# Patient Record
Sex: Male | Born: 1935 | Race: Black or African American | Hispanic: No | Marital: Married | Smoking: Never smoker
Health system: Southern US, Community
[De-identification: ages and names within clinical notes are randomized; demographics above are authoritative.]

## PROBLEM LIST (undated history)

## (undated) ENCOUNTER — Ambulatory Visit (HOSPITAL_COMMUNITY): Payer: Medicare Other

## (undated) DIAGNOSIS — R51 Headache: Secondary | ICD-10-CM

## (undated) DIAGNOSIS — H5440 Blindness, one eye, unspecified eye: Secondary | ICD-10-CM

## (undated) DIAGNOSIS — I10 Essential (primary) hypertension: Secondary | ICD-10-CM

## (undated) DIAGNOSIS — I639 Cerebral infarction, unspecified: Secondary | ICD-10-CM

## (undated) DIAGNOSIS — I2510 Atherosclerotic heart disease of native coronary artery without angina pectoris: Secondary | ICD-10-CM

## (undated) DIAGNOSIS — Z8719 Personal history of other diseases of the digestive system: Secondary | ICD-10-CM

## (undated) DIAGNOSIS — Z91199 Patient's noncompliance with other medical treatment and regimen due to unspecified reason: Secondary | ICD-10-CM

## (undated) DIAGNOSIS — H547 Unspecified visual loss: Secondary | ICD-10-CM

## (undated) DIAGNOSIS — F1011 Alcohol abuse, in remission: Secondary | ICD-10-CM

## (undated) DIAGNOSIS — M1990 Unspecified osteoarthritis, unspecified site: Secondary | ICD-10-CM

## (undated) DIAGNOSIS — I5032 Chronic diastolic (congestive) heart failure: Secondary | ICD-10-CM

## (undated) DIAGNOSIS — Z87448 Personal history of other diseases of urinary system: Secondary | ICD-10-CM

## (undated) DIAGNOSIS — I471 Supraventricular tachycardia, unspecified: Secondary | ICD-10-CM

## (undated) DIAGNOSIS — Z9119 Patient's noncompliance with other medical treatment and regimen: Secondary | ICD-10-CM

## (undated) DIAGNOSIS — E785 Hyperlipidemia, unspecified: Secondary | ICD-10-CM

## (undated) DIAGNOSIS — R0989 Other specified symptoms and signs involving the circulatory and respiratory systems: Secondary | ICD-10-CM

## (undated) DIAGNOSIS — H409 Unspecified glaucoma: Secondary | ICD-10-CM

## (undated) DIAGNOSIS — Z9889 Other specified postprocedural states: Secondary | ICD-10-CM

## (undated) DIAGNOSIS — R001 Bradycardia, unspecified: Secondary | ICD-10-CM

## (undated) DIAGNOSIS — N183 Chronic kidney disease, stage 3 (moderate): Secondary | ICD-10-CM

## (undated) HISTORY — DX: Personal history of other diseases of urinary system: Z87.448

## (undated) HISTORY — DX: Personal history of other diseases of the digestive system: Z87.19

## (undated) HISTORY — DX: Unspecified osteoarthritis, unspecified site: M19.90

## (undated) HISTORY — DX: Atherosclerotic heart disease of native coronary artery without angina pectoris: I25.10

## (undated) HISTORY — DX: Blindness, one eye, unspecified eye: H54.40

## (undated) HISTORY — DX: Essential (primary) hypertension: I10

## (undated) HISTORY — DX: Other specified symptoms and signs involving the circulatory and respiratory systems: R09.89

## (undated) HISTORY — PX: HERNIA REPAIR: SHX51

## (undated) HISTORY — PX: COLONOSCOPY: SHX174

## (undated) HISTORY — DX: Other specified postprocedural states: Z98.890

## (undated) HISTORY — DX: Supraventricular tachycardia: I47.1

## (undated) HISTORY — DX: Chronic kidney disease, stage 3 (moderate): N18.3

## (undated) HISTORY — DX: Patient's noncompliance with other medical treatment and regimen due to unspecified reason: Z91.199

## (undated) HISTORY — PX: APPENDECTOMY: SHX54

## (undated) HISTORY — DX: Bradycardia, unspecified: R00.1

## (undated) HISTORY — DX: Chronic diastolic (congestive) heart failure: I50.32

## (undated) HISTORY — DX: Cerebral infarction, unspecified: I63.9

## (undated) HISTORY — DX: Hyperlipidemia, unspecified: E78.5

## (undated) HISTORY — DX: Alcohol abuse, in remission: F10.11

## (undated) HISTORY — DX: Patient's noncompliance with other medical treatment and regimen: Z91.19

## (undated) HISTORY — DX: Supraventricular tachycardia, unspecified: I47.10

## (undated) SURGERY — REPAIR, HERNIA, VENTRAL, LAPAROSCOPIC
Anesthesia: General

---

## 2002-08-03 ENCOUNTER — Inpatient Hospital Stay (HOSPITAL_COMMUNITY): Admission: EM | Admit: 2002-08-03 | Discharge: 2002-08-06 | Payer: Self-pay | Admitting: Emergency Medicine

## 2002-08-03 ENCOUNTER — Encounter: Payer: Self-pay | Admitting: Emergency Medicine

## 2002-08-04 ENCOUNTER — Encounter: Payer: Self-pay | Admitting: Cardiology

## 2002-08-05 ENCOUNTER — Encounter: Payer: Self-pay | Admitting: Family Medicine

## 2002-08-16 ENCOUNTER — Encounter: Admission: RE | Admit: 2002-08-16 | Discharge: 2002-08-16 | Payer: Self-pay | Admitting: Family Medicine

## 2003-11-09 HISTORY — PX: CARDIAC CATHETERIZATION: SHX172

## 2003-11-26 ENCOUNTER — Inpatient Hospital Stay (HOSPITAL_COMMUNITY): Admission: EM | Admit: 2003-11-26 | Discharge: 2003-11-29 | Payer: Self-pay | Admitting: Emergency Medicine

## 2008-09-08 ENCOUNTER — Emergency Department (HOSPITAL_COMMUNITY): Admission: EM | Admit: 2008-09-08 | Discharge: 2008-09-08 | Payer: Self-pay | Admitting: Emergency Medicine

## 2009-08-10 HISTORY — PX: CARDIAC CATHETERIZATION: SHX172

## 2009-08-17 ENCOUNTER — Ambulatory Visit: Payer: Self-pay | Admitting: Cardiology

## 2009-08-17 ENCOUNTER — Inpatient Hospital Stay (HOSPITAL_COMMUNITY): Admission: EM | Admit: 2009-08-17 | Discharge: 2009-08-23 | Payer: Self-pay | Admitting: Emergency Medicine

## 2009-08-19 ENCOUNTER — Encounter (INDEPENDENT_AMBULATORY_CARE_PROVIDER_SITE_OTHER): Payer: Self-pay | Admitting: Internal Medicine

## 2009-08-19 ENCOUNTER — Encounter: Payer: Self-pay | Admitting: Cardiovascular Disease

## 2010-03-18 ENCOUNTER — Observation Stay (HOSPITAL_COMMUNITY): Admission: EM | Admit: 2010-03-18 | Discharge: 2010-03-22 | Payer: Self-pay | Admitting: Emergency Medicine

## 2010-03-18 ENCOUNTER — Ambulatory Visit: Payer: Self-pay | Admitting: Cardiology

## 2010-03-19 ENCOUNTER — Encounter (INDEPENDENT_AMBULATORY_CARE_PROVIDER_SITE_OTHER): Payer: Self-pay | Admitting: Internal Medicine

## 2010-09-09 NOTE — Consult Note (Signed)
Summary: [facility]   Imported By: Sallee Provencal 09/04/2009 14:55:59  _____________________________________________________________________  External Attachment:    Type:   Image     Comment:   External Document

## 2010-10-24 LAB — HEMOGLOBIN A1C
Hgb A1c MFr Bld: 6.1 % — ABNORMAL HIGH (ref <5.7)
Mean Plasma Glucose: 128 mg/dL — ABNORMAL HIGH (ref <117)

## 2010-10-24 LAB — BASIC METABOLIC PANEL
BUN: 16 mg/dL (ref 6–23)
BUN: 16 mg/dL (ref 6–23)
BUN: 22 mg/dL (ref 6–23)
BUN: 25 mg/dL — ABNORMAL HIGH (ref 6–23)
CO2: 21 mEq/L (ref 19–32)
CO2: 21 mEq/L (ref 19–32)
CO2: 24 mEq/L (ref 19–32)
CO2: 25 mEq/L (ref 19–32)
Calcium: 8.9 mg/dL (ref 8.4–10.5)
Calcium: 9 mg/dL (ref 8.4–10.5)
Calcium: 9 mg/dL (ref 8.4–10.5)
Calcium: 9 mg/dL (ref 8.4–10.5)
Chloride: 103 mEq/L (ref 96–112)
Chloride: 103 mEq/L (ref 96–112)
Chloride: 105 mEq/L (ref 96–112)
Chloride: 106 mEq/L (ref 96–112)
Creatinine, Ser: 1.46 mg/dL (ref 0.4–1.5)
Creatinine, Ser: 1.54 mg/dL — ABNORMAL HIGH (ref 0.4–1.5)
Creatinine, Ser: 1.65 mg/dL — ABNORMAL HIGH (ref 0.4–1.5)
Creatinine, Ser: 1.69 mg/dL — ABNORMAL HIGH (ref 0.4–1.5)
GFR calc Af Amer: 48 mL/min — ABNORMAL LOW (ref >60)
GFR calc Af Amer: 50 mL/min — ABNORMAL LOW (ref >60)
GFR calc Af Amer: 54 mL/min — ABNORMAL LOW (ref >60)
GFR calc Af Amer: 57 mL/min — ABNORMAL LOW (ref >60)
GFR calc non Af Amer: 40 mL/min — ABNORMAL LOW (ref >60)
GFR calc non Af Amer: 41 mL/min — ABNORMAL LOW (ref >60)
GFR calc non Af Amer: 45 mL/min — ABNORMAL LOW (ref >60)
GFR calc non Af Amer: 47 mL/min — ABNORMAL LOW (ref >60)
Glucose, Bld: 115 mg/dL — ABNORMAL HIGH (ref 70–99)
Glucose, Bld: 118 mg/dL — ABNORMAL HIGH (ref 70–99)
Glucose, Bld: 121 mg/dL — ABNORMAL HIGH (ref 70–99)
Glucose, Bld: 124 mg/dL — ABNORMAL HIGH (ref 70–99)
Potassium: 3.8 mEq/L (ref 3.5–5.1)
Potassium: 4.1 mEq/L (ref 3.5–5.1)
Potassium: 4.5 mEq/L (ref 3.5–5.1)
Potassium: 4.8 mEq/L (ref 3.5–5.1)
Sodium: 135 mEq/L (ref 135–145)
Sodium: 136 mEq/L (ref 135–145)
Sodium: 138 mEq/L (ref 135–145)
Sodium: 139 mEq/L (ref 135–145)

## 2010-10-24 LAB — CARDIAC PANEL(CRET KIN+CKTOT+MB+TROPI)
CK, MB: 2.4 ng/mL (ref 0.3–4.0)
CK, MB: 2.5 ng/mL (ref 0.3–4.0)
CK, MB: 2.5 ng/mL (ref 0.3–4.0)
Relative Index: 2.4 (ref 0.0–2.5)
Relative Index: 2.4 (ref 0.0–2.5)
Relative Index: INVALID (ref 0.0–2.5)
Total CK: 103 U/L (ref 7–232)
Total CK: 104 U/L (ref 7–232)
Total CK: 93 U/L (ref 7–232)
Troponin I: 0.04 ng/mL (ref 0.00–0.06)
Troponin I: 0.04 ng/mL (ref 0.00–0.06)
Troponin I: 0.05 ng/mL (ref 0.00–0.06)

## 2010-10-24 LAB — COMPREHENSIVE METABOLIC PANEL
ALT: 12 U/L (ref 0–53)
AST: 22 U/L (ref 0–37)
Albumin: 3.5 g/dL (ref 3.5–5.2)
Alkaline Phosphatase: 74 U/L (ref 39–117)
BUN: 18 mg/dL (ref 6–23)
CO2: 24 mEq/L (ref 19–32)
Calcium: 8.9 mg/dL (ref 8.4–10.5)
Chloride: 109 mEq/L (ref 96–112)
Creatinine, Ser: 1.72 mg/dL — ABNORMAL HIGH (ref 0.4–1.5)
GFR calc Af Amer: 48 mL/min — ABNORMAL LOW (ref >60)
GFR calc non Af Amer: 39 mL/min — ABNORMAL LOW (ref >60)
Glucose, Bld: 95 mg/dL (ref 70–99)
Potassium: 3.7 mEq/L (ref 3.5–5.1)
Sodium: 138 mEq/L (ref 135–145)
Total Bilirubin: 0.6 mg/dL (ref 0.3–1.2)
Total Protein: 7.1 g/dL (ref 6.0–8.3)

## 2010-10-24 LAB — URINALYSIS, ROUTINE W REFLEX MICROSCOPIC
Bilirubin Urine: NEGATIVE
Glucose, UA: NEGATIVE mg/dL
Hgb urine dipstick: NEGATIVE
Ketones, ur: NEGATIVE mg/dL
Nitrite: NEGATIVE
Protein, ur: NEGATIVE mg/dL
Specific Gravity, Urine: 1.007 (ref 1.005–1.030)
Urobilinogen, UA: 0.2 mg/dL (ref 0.0–1.0)
pH: 5 (ref 5.0–8.0)

## 2010-10-24 LAB — D-DIMER, QUANTITATIVE (NOT AT ARMC): D-Dimer, Quant: 0.55 ug/mL-FEU — ABNORMAL HIGH (ref 0.00–0.48)

## 2010-10-24 LAB — CBC
HCT: 47.7 % (ref 39.0–52.0)
HCT: 51.3 % (ref 39.0–52.0)
HCT: 51.7 % (ref 39.0–52.0)
Hemoglobin: 15.9 g/dL (ref 13.0–17.0)
Hemoglobin: 17.2 g/dL — ABNORMAL HIGH (ref 13.0–17.0)
Hemoglobin: 17.4 g/dL — ABNORMAL HIGH (ref 13.0–17.0)
MCH: 27.1 pg (ref 26.0–34.0)
MCH: 27.2 pg (ref 26.0–34.0)
MCH: 27.4 pg (ref 26.0–34.0)
MCHC: 33.3 g/dL (ref 30.0–36.0)
MCHC: 33.5 g/dL (ref 30.0–36.0)
MCHC: 33.7 g/dL (ref 30.0–36.0)
MCV: 80.9 fL (ref 78.0–100.0)
MCV: 81.3 fL (ref 78.0–100.0)
MCV: 81.7 fL (ref 78.0–100.0)
Platelets: 136 10*3/uL — ABNORMAL LOW (ref 150–400)
Platelets: 145 10*3/uL — ABNORMAL LOW (ref 150–400)
Platelets: 147 10*3/uL — ABNORMAL LOW (ref 150–400)
RBC: 5.87 MIL/uL — ABNORMAL HIGH (ref 4.22–5.81)
RBC: 6.28 MIL/uL — ABNORMAL HIGH (ref 4.22–5.81)
RBC: 6.39 MIL/uL — ABNORMAL HIGH (ref 4.22–5.81)
RDW: 15.6 % — ABNORMAL HIGH (ref 11.5–15.5)
RDW: 15.6 % — ABNORMAL HIGH (ref 11.5–15.5)
RDW: 16 % — ABNORMAL HIGH (ref 11.5–15.5)
WBC: 5.4 10*3/uL (ref 4.0–10.5)
WBC: 5.8 10*3/uL (ref 4.0–10.5)
WBC: 7 10*3/uL (ref 4.0–10.5)

## 2010-10-24 LAB — TSH: TSH: 3.483 u[IU]/mL (ref 0.350–4.500)

## 2010-10-24 LAB — CORTISOL: Cortisol, Plasma: 12.9 ug/dL

## 2010-10-24 LAB — POCT CARDIAC MARKERS
CKMB, poc: 1.2 ng/mL (ref 1.0–8.0)
Myoglobin, poc: 60.6 ng/mL (ref 12–200)
Troponin i, poc: <0.05 ng/mL (ref 0.00–0.09)

## 2010-10-24 LAB — DIFFERENTIAL
Basophils Absolute: 0 10*3/uL (ref 0.0–0.1)
Basophils Relative: 0 % (ref 0–1)
Eosinophils Absolute: 0.1 10*3/uL (ref 0.0–0.7)
Eosinophils Relative: 2 % (ref 0–5)
Lymphocytes Relative: 32 % (ref 12–46)
Lymphs Abs: 1.7 10*3/uL (ref 0.7–4.0)
Monocytes Absolute: 0.5 10*3/uL (ref 0.1–1.0)
Monocytes Relative: 10 % (ref 3–12)
Neutro Abs: 3.1 10*3/uL (ref 1.7–7.7)
Neutrophils Relative %: 56 % (ref 43–77)

## 2010-10-24 LAB — D-DIMER, QUANTITATIVE: D-Dimer, Quant: 0.6 ug/mL-FEU — ABNORMAL HIGH (ref 0.00–0.48)

## 2010-10-24 LAB — LIPID PANEL
Cholesterol: 149 mg/dL (ref 0–200)
HDL: 38 mg/dL — ABNORMAL LOW (ref >39)
LDL Cholesterol: 92 mg/dL (ref 0–99)
Total CHOL/HDL Ratio: 3.9 RATIO
Triglycerides: 93 mg/dL (ref <150)
VLDL: 19 mg/dL (ref 0–40)

## 2010-10-24 LAB — BRAIN NATRIURETIC PEPTIDE: Pro B Natriuretic peptide (BNP): 230 pg/mL — ABNORMAL HIGH (ref 0.0–100.0)

## 2010-10-26 LAB — POCT I-STAT, CHEM 8
BUN: 16 mg/dL (ref 6–23)
Calcium, Ion: 1.14 mmol/L (ref 1.12–1.32)
Chloride: 108 mEq/L (ref 96–112)
Creatinine, Ser: 1.4 mg/dL (ref 0.4–1.5)
Glucose, Bld: 124 mg/dL — ABNORMAL HIGH (ref 70–99)
HCT: 50 % (ref 39.0–52.0)
Hemoglobin: 17 g/dL (ref 13.0–17.0)
Potassium: 4.6 mEq/L (ref 3.5–5.1)
Sodium: 143 mEq/L (ref 135–145)
TCO2: 29 mmol/L (ref 0–100)

## 2010-10-26 LAB — CBC
HCT: 45.3 % (ref 39.0–52.0)
HCT: 45.7 % (ref 39.0–52.0)
HCT: 45.8 % (ref 39.0–52.0)
HCT: 47.3 % (ref 39.0–52.0)
Hemoglobin: 15.3 g/dL (ref 13.0–17.0)
Hemoglobin: 15.3 g/dL (ref 13.0–17.0)
Hemoglobin: 15.4 g/dL (ref 13.0–17.0)
Hemoglobin: 15.9 g/dL (ref 13.0–17.0)
MCHC: 33.4 g/dL (ref 30.0–36.0)
MCHC: 33.4 g/dL (ref 30.0–36.0)
MCHC: 33.7 g/dL (ref 30.0–36.0)
MCHC: 33.9 g/dL (ref 30.0–36.0)
MCV: 82.2 fL (ref 78.0–100.0)
MCV: 82.3 fL (ref 78.0–100.0)
MCV: 83 fL (ref 78.0–100.0)
MCV: 83.2 fL (ref 78.0–100.0)
Platelets: 148 10*3/uL — ABNORMAL LOW (ref 150–400)
Platelets: 148 10*3/uL — ABNORMAL LOW (ref 150–400)
Platelets: 153 10*3/uL (ref 150–400)
Platelets: 160 10*3/uL (ref 150–400)
RBC: 5.51 MIL/uL (ref 4.22–5.81)
RBC: 5.51 MIL/uL (ref 4.22–5.81)
RBC: 5.51 MIL/uL (ref 4.22–5.81)
RBC: 5.75 MIL/uL (ref 4.22–5.81)
RDW: 16.1 % — ABNORMAL HIGH (ref 11.5–15.5)
RDW: 16.2 % — ABNORMAL HIGH (ref 11.5–15.5)
RDW: 16.4 % — ABNORMAL HIGH (ref 11.5–15.5)
RDW: 16.6 % — ABNORMAL HIGH (ref 11.5–15.5)
WBC: 4.3 10*3/uL (ref 4.0–10.5)
WBC: 4.8 10*3/uL (ref 4.0–10.5)
WBC: 5 10*3/uL (ref 4.0–10.5)
WBC: 5.8 10*3/uL (ref 4.0–10.5)

## 2010-10-26 LAB — BASIC METABOLIC PANEL
BUN: 14 mg/dL (ref 6–23)
BUN: 19 mg/dL (ref 6–23)
BUN: 20 mg/dL (ref 6–23)
BUN: 21 mg/dL (ref 6–23)
BUN: 22 mg/dL (ref 6–23)
CO2: 24 mEq/L (ref 19–32)
CO2: 25 mEq/L (ref 19–32)
CO2: 25 mEq/L (ref 19–32)
CO2: 26 mEq/L (ref 19–32)
CO2: 27 mEq/L (ref 19–32)
Calcium: 8.2 mg/dL — ABNORMAL LOW (ref 8.4–10.5)
Calcium: 8.5 mg/dL (ref 8.4–10.5)
Calcium: 8.7 mg/dL (ref 8.4–10.5)
Calcium: 9 mg/dL (ref 8.4–10.5)
Calcium: 9 mg/dL (ref 8.4–10.5)
Chloride: 105 mEq/L (ref 96–112)
Chloride: 105 mEq/L (ref 96–112)
Chloride: 107 mEq/L (ref 96–112)
Chloride: 107 mEq/L (ref 96–112)
Chloride: 108 mEq/L (ref 96–112)
Creatinine, Ser: 1.27 mg/dL (ref 0.4–1.5)
Creatinine, Ser: 1.43 mg/dL (ref 0.4–1.5)
Creatinine, Ser: 1.46 mg/dL (ref 0.4–1.5)
Creatinine, Ser: 1.52 mg/dL — ABNORMAL HIGH (ref 0.4–1.5)
Creatinine, Ser: 1.68 mg/dL — ABNORMAL HIGH (ref 0.4–1.5)
GFR calc Af Amer: 49 mL/min — ABNORMAL LOW (ref >60)
GFR calc Af Amer: 55 mL/min — ABNORMAL LOW (ref >60)
GFR calc Af Amer: 57 mL/min — ABNORMAL LOW (ref >60)
GFR calc Af Amer: 59 mL/min — ABNORMAL LOW (ref >60)
GFR calc Af Amer: >60 mL/min (ref >60)
GFR calc non Af Amer: 40 mL/min — ABNORMAL LOW (ref >60)
GFR calc non Af Amer: 45 mL/min — ABNORMAL LOW (ref >60)
GFR calc non Af Amer: 47 mL/min — ABNORMAL LOW (ref >60)
GFR calc non Af Amer: 49 mL/min — ABNORMAL LOW (ref >60)
GFR calc non Af Amer: 56 mL/min — ABNORMAL LOW (ref >60)
Glucose, Bld: 106 mg/dL — ABNORMAL HIGH (ref 70–99)
Glucose, Bld: 108 mg/dL — ABNORMAL HIGH (ref 70–99)
Glucose, Bld: 108 mg/dL — ABNORMAL HIGH (ref 70–99)
Glucose, Bld: 112 mg/dL — ABNORMAL HIGH (ref 70–99)
Glucose, Bld: 98 mg/dL (ref 70–99)
Potassium: 3.8 mEq/L (ref 3.5–5.1)
Potassium: 3.9 mEq/L (ref 3.5–5.1)
Potassium: 3.9 mEq/L (ref 3.5–5.1)
Potassium: 4.2 mEq/L (ref 3.5–5.1)
Potassium: 4.2 mEq/L (ref 3.5–5.1)
Sodium: 137 mEq/L (ref 135–145)
Sodium: 137 mEq/L (ref 135–145)
Sodium: 138 mEq/L (ref 135–145)
Sodium: 138 mEq/L (ref 135–145)
Sodium: 141 mEq/L (ref 135–145)

## 2010-10-26 LAB — URINALYSIS, ROUTINE W REFLEX MICROSCOPIC
Bilirubin Urine: NEGATIVE
Glucose, UA: NEGATIVE mg/dL
Ketones, ur: NEGATIVE mg/dL
Leukocytes, UA: NEGATIVE
Nitrite: NEGATIVE
Protein, ur: >300 mg/dL — AB
Specific Gravity, Urine: 1.024 (ref 1.005–1.030)
Urobilinogen, UA: 1 mg/dL (ref 0.0–1.0)
pH: 6 (ref 5.0–8.0)

## 2010-10-26 LAB — LIPID PANEL
Cholesterol: 177 mg/dL (ref 0–200)
HDL: 32 mg/dL — ABNORMAL LOW (ref >39)
LDL Cholesterol: 122 mg/dL — ABNORMAL HIGH (ref 0–99)
Total CHOL/HDL Ratio: 5.5 RATIO
Triglycerides: 113 mg/dL (ref <150)
VLDL: 23 mg/dL (ref 0–40)

## 2010-10-26 LAB — CARDIAC PANEL(CRET KIN+CKTOT+MB+TROPI)
CK, MB: 2.9 ng/mL (ref 0.3–4.0)
CK, MB: 3.2 ng/mL (ref 0.3–4.0)
CK, MB: 3.2 ng/mL (ref 0.3–4.0)
CK, MB: 3.7 ng/mL (ref 0.3–4.0)
Relative Index: 2.9 — ABNORMAL HIGH (ref 0.0–2.5)
Relative Index: 2.9 — ABNORMAL HIGH (ref 0.0–2.5)
Relative Index: 3.1 — ABNORMAL HIGH (ref 0.0–2.5)
Relative Index: INVALID (ref 0.0–2.5)
Total CK: 102 U/L (ref 7–232)
Total CK: 109 U/L (ref 7–232)
Total CK: 129 U/L (ref 7–232)
Total CK: 94 U/L (ref 7–232)
Troponin I: 0.08 ng/mL — ABNORMAL HIGH (ref 0.00–0.06)
Troponin I: 0.1 ng/mL — ABNORMAL HIGH (ref 0.00–0.06)
Troponin I: 0.1 ng/mL — ABNORMAL HIGH (ref 0.00–0.06)
Troponin I: 0.1 ng/mL — ABNORMAL HIGH (ref 0.00–0.06)

## 2010-10-26 LAB — COMPREHENSIVE METABOLIC PANEL
ALT: 15 U/L (ref 0–53)
AST: 21 U/L (ref 0–37)
Albumin: 3.3 g/dL — ABNORMAL LOW (ref 3.5–5.2)
Alkaline Phosphatase: 72 U/L (ref 39–117)
BUN: 21 mg/dL (ref 6–23)
CO2: 23 mEq/L (ref 19–32)
Calcium: 8.5 mg/dL (ref 8.4–10.5)
Chloride: 109 mEq/L (ref 96–112)
Creatinine, Ser: 1.42 mg/dL (ref 0.4–1.5)
GFR calc Af Amer: 59 mL/min — ABNORMAL LOW (ref >60)
GFR calc non Af Amer: 49 mL/min — ABNORMAL LOW (ref >60)
Glucose, Bld: 102 mg/dL — ABNORMAL HIGH (ref 70–99)
Potassium: 3.7 mEq/L (ref 3.5–5.1)
Sodium: 140 mEq/L (ref 135–145)
Total Bilirubin: 0.7 mg/dL (ref 0.3–1.2)
Total Protein: 6.5 g/dL (ref 6.0–8.3)

## 2010-10-26 LAB — HEMOGLOBIN A1C
Hgb A1c MFr Bld: 5.2 % (ref 4.6–6.1)
Mean Plasma Glucose: 103 mg/dL

## 2010-10-26 LAB — POCT CARDIAC MARKERS
CKMB, poc: 1.8 ng/mL (ref 1.0–8.0)
Myoglobin, poc: 52.5 ng/mL (ref 12–200)
Troponin i, poc: <0.05 ng/mL (ref 0.00–0.09)

## 2010-10-26 LAB — DIFFERENTIAL
Basophils Absolute: 0 10*3/uL (ref 0.0–0.1)
Basophils Absolute: 0 10*3/uL (ref 0.0–0.1)
Basophils Relative: 1 % (ref 0–1)
Basophils Relative: 1 % (ref 0–1)
Eosinophils Absolute: 0.1 10*3/uL (ref 0.0–0.7)
Eosinophils Absolute: 0.2 10*3/uL (ref 0.0–0.7)
Eosinophils Relative: 3 % (ref 0–5)
Eosinophils Relative: 4 % (ref 0–5)
Lymphocytes Relative: 25 % (ref 12–46)
Lymphocytes Relative: 27 % (ref 12–46)
Lymphs Abs: 1.2 10*3/uL (ref 0.7–4.0)
Lymphs Abs: 1.2 10*3/uL (ref 0.7–4.0)
Monocytes Absolute: 0.3 10*3/uL (ref 0.1–1.0)
Monocytes Absolute: 0.4 10*3/uL (ref 0.1–1.0)
Monocytes Relative: 8 % (ref 3–12)
Monocytes Relative: 9 % (ref 3–12)
Neutro Abs: 2.7 10*3/uL (ref 1.7–7.7)
Neutro Abs: 3 10*3/uL (ref 1.7–7.7)
Neutrophils Relative %: 62 % (ref 43–77)
Neutrophils Relative %: 62 % (ref 43–77)

## 2010-10-26 LAB — TSH: TSH: 3.094 u[IU]/mL (ref 0.350–4.500)

## 2010-10-26 LAB — URINE MICROSCOPIC-ADD ON

## 2010-10-26 LAB — GLUCOSE, CAPILLARY
Glucose-Capillary: 132 mg/dL — ABNORMAL HIGH (ref 70–99)
Glucose-Capillary: 151 mg/dL — ABNORMAL HIGH (ref 70–99)

## 2010-10-26 LAB — MAGNESIUM: Magnesium: 2 mg/dL (ref 1.5–2.5)

## 2010-10-26 LAB — PROTIME-INR
INR: 1.1 (ref 0.00–1.49)
Prothrombin Time: 14.1 seconds (ref 11.6–15.2)

## 2010-10-26 LAB — URIC ACID: Uric Acid, Serum: 5.7 mg/dL (ref 4.0–7.8)

## 2010-10-26 LAB — PHOSPHORUS: Phosphorus: 3.5 mg/dL (ref 2.3–4.6)

## 2010-10-26 LAB — BRAIN NATRIURETIC PEPTIDE: Pro B Natriuretic peptide (BNP): 495 pg/mL — ABNORMAL HIGH (ref 0.0–100.0)

## 2010-11-24 LAB — POCT I-STAT, CHEM 8
BUN: 18 mg/dL (ref 6–23)
Calcium, Ion: 1.07 mmol/L — ABNORMAL LOW (ref 1.12–1.32)
Chloride: 105 mEq/L (ref 96–112)
Creatinine, Ser: 1.4 mg/dL (ref 0.4–1.5)
Glucose, Bld: 128 mg/dL — ABNORMAL HIGH (ref 70–99)
HCT: 47 % (ref 39.0–52.0)
Hemoglobin: 16 g/dL (ref 13.0–17.0)
Potassium: 3.9 mEq/L (ref 3.5–5.1)
Sodium: 140 mEq/L (ref 135–145)
TCO2: 25 mmol/L (ref 0–100)
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
## 2011-02-10 ENCOUNTER — Encounter (INDEPENDENT_AMBULATORY_CARE_PROVIDER_SITE_OTHER): Payer: Self-pay | Admitting: General Surgery

## 2011-02-10 ENCOUNTER — Ambulatory Visit (INDEPENDENT_AMBULATORY_CARE_PROVIDER_SITE_OTHER): Payer: Medicare HMO | Admitting: General Surgery

## 2011-02-10 VITALS — BP 136/86 | HR 60 | Ht 70.0 in | Wt 170.0 lb

## 2011-02-10 DIAGNOSIS — IMO0001 Reserved for inherently not codable concepts without codable children: Secondary | ICD-10-CM | POA: Insufficient documentation

## 2011-02-10 DIAGNOSIS — K469 Unspecified abdominal hernia without obstruction or gangrene: Secondary | ICD-10-CM

## 2011-02-10 NOTE — Progress Notes (Signed)
Subjective:     Patient ID: Trevor Murphy, male   DOB: 09-28-1934, 76 y.o.   MRN: [id number]    There were no vitals taken for this visit.    HPI The patient is a 76 year old male who was evaluated by Dr. Juanita Craver. He was doing the followup for colonoscopy and upper GI evaluation patient was noted to have a supraumbilical ventral hernia and also a recurrent right inguinal hernia. His right inguinal hernia had been repaired approximately 25 years ago. It is unknown whether or not mesh was used at that time.  The patient is more symptomatic in his epigastric and supraumbilical hernia not as much so in his right recurrent inguinal hernia area however it is disconcerting to his wife and family members. He comes in now for evaluation Review of Systems His review of systems is significant only for gout and hypertension. He states that he has poor circulation in his lower extremities however he is remarking more about the discomfort from gout and not cause a circulatory ulcerations in his legs.    Objective:   Physical Exam  Constitutional: He is oriented to person, place, and time. He appears well-developed and well-nourished.  Eyes: EOM are normal. Pupils are equal, round, and reactive to light. Scleral icterus is present.  Neck: Normal range of motion. Neck supple. No JVD present. No tracheal deviation present.  Cardiovascular: Regular rhythm and normal heart sounds.   No murmur heard. Pulmonary/Chest: Effort normal and breath sounds normal.  Abdominal: Soft. Bowel sounds are normal. He exhibits no mass. There is no tenderness. There is no rebound. A hernia (Large recurrent RIH and epigastric/supraumbilical hernia with diastasis recti) is present.    Genitourinary: Penis normal.  Musculoskeletal: Normal range of motion.       Right shoulder: He exhibits normal pulse.  Lymphadenopathy:    He has no cervical adenopathy.  Neurological: He is alert and oriented to person, place, and time. He  has normal reflexes.  Skin: Skin is warm.       Assessment:     Recurrent RIH. Supraumbilical hernia with diastasis Recti    Plan:     Schedule for O.R. ASAP Preoperative antibiotics. Will use mesh

## 2011-02-25 ENCOUNTER — Encounter (HOSPITAL_COMMUNITY)
Admission: RE | Admit: 2011-02-25 | Discharge: 2011-02-25 | Disposition: A | Discharge status: Home / Self Care | Payer: Medicare HMO | Source: Ambulatory Visit | Attending: General Surgery | Admitting: General Surgery

## 2011-02-25 LAB — CBC
HCT: 47.6 % (ref 39.0–52.0)
Hemoglobin: 16.2 g/dL (ref 13.0–17.0)
MCH: 27.1 pg (ref 26.0–34.0)
MCHC: 34 g/dL (ref 30.0–36.0)
MCV: 79.7 fL (ref 78.0–100.0)
Platelets: 177 10*3/uL (ref 150–400)
RBC: 5.97 MIL/uL — ABNORMAL HIGH (ref 4.22–5.81)
RDW: 15.1 % (ref 11.5–15.5)
WBC: 6.4 10*3/uL (ref 4.0–10.5)

## 2011-02-25 LAB — BASIC METABOLIC PANEL
BUN: 20 mg/dL (ref 6–23)
CO2: 27 mEq/L (ref 19–32)
Calcium: 9.3 mg/dL (ref 8.4–10.5)
Chloride: 104 mEq/L (ref 96–112)
Creatinine, Ser: 1.39 mg/dL — ABNORMAL HIGH (ref 0.50–1.35)
GFR calc Af Amer: >60 mL/min (ref >60)
GFR calc non Af Amer: 50 mL/min — ABNORMAL LOW (ref >60)
Glucose, Bld: 112 mg/dL — ABNORMAL HIGH (ref 70–99)
Potassium: 4.7 mEq/L (ref 3.5–5.1)
Sodium: 140 mEq/L (ref 135–145)

## 2011-02-25 LAB — DIFFERENTIAL
Basophils Absolute: 0 10*3/uL (ref 0.0–0.1)
Basophils Relative: 1 % (ref 0–1)
Eosinophils Absolute: 0.3 10*3/uL (ref 0.0–0.7)
Eosinophils Relative: 4 % (ref 0–5)
Lymphocytes Relative: 27 % (ref 12–46)
Lymphs Abs: 1.8 10*3/uL (ref 0.7–4.0)
Monocytes Absolute: 0.4 10*3/uL (ref 0.1–1.0)
Monocytes Relative: 7 % (ref 3–12)
Neutro Abs: 3.9 10*3/uL (ref 1.7–7.7)
Neutrophils Relative %: 61 % (ref 43–77)

## 2011-02-25 LAB — SURGICAL PCR SCREEN
MRSA, PCR: NEGATIVE
Staphylococcus aureus: NEGATIVE

## 2011-02-26 ENCOUNTER — Ambulatory Visit (HOSPITAL_COMMUNITY): Payer: Medicare HMO

## 2011-02-26 ENCOUNTER — Inpatient Hospital Stay (HOSPITAL_COMMUNITY)
Admission: RE | Admit: 2011-02-26 | Discharge: 2011-02-28 | DRG: 352 | Disposition: A | Discharge status: Home / Self Care | Payer: Medicare HMO | Source: Ambulatory Visit | Attending: General Surgery | Admitting: General Surgery

## 2011-02-26 DIAGNOSIS — K429 Umbilical hernia without obstruction or gangrene: Secondary | ICD-10-CM | POA: Diagnosis present

## 2011-02-26 DIAGNOSIS — Z01812 Encounter for preprocedural laboratory examination: Secondary | ICD-10-CM

## 2011-02-26 DIAGNOSIS — R339 Retention of urine, unspecified: Secondary | ICD-10-CM | POA: Diagnosis not present

## 2011-02-26 DIAGNOSIS — K439 Ventral hernia without obstruction or gangrene: Secondary | ICD-10-CM | POA: Diagnosis present

## 2011-02-26 DIAGNOSIS — IMO0002 Reserved for concepts with insufficient information to code with codable children: Secondary | ICD-10-CM | POA: Diagnosis not present

## 2011-02-26 DIAGNOSIS — Y838 Other surgical procedures as the cause of abnormal reaction of the patient, or of later complication, without mention of misadventure at the time of the procedure: Secondary | ICD-10-CM | POA: Diagnosis not present

## 2011-02-26 DIAGNOSIS — K4090 Unilateral inguinal hernia, without obstruction or gangrene, not specified as recurrent: Principal | ICD-10-CM | POA: Diagnosis present

## 2011-02-26 DIAGNOSIS — N9989 Other postprocedural complications and disorders of genitourinary system: Secondary | ICD-10-CM | POA: Diagnosis not present

## 2011-02-26 LAB — GLUCOSE, CAPILLARY: Glucose-Capillary: 162 mg/dL — ABNORMAL HIGH (ref 70–99)

## 2011-02-28 LAB — BASIC METABOLIC PANEL
BUN: 20 mg/dL (ref 6–23)
CO2: 26 mEq/L (ref 19–32)
Calcium: 8.9 mg/dL (ref 8.4–10.5)
Chloride: 99 mEq/L (ref 96–112)
Creatinine, Ser: 1.31 mg/dL (ref 0.50–1.35)
GFR calc Af Amer: >60 mL/min (ref >60)
GFR calc non Af Amer: 54 mL/min — ABNORMAL LOW (ref >60)
Glucose, Bld: 110 mg/dL — ABNORMAL HIGH (ref 70–99)
Potassium: 4.5 mEq/L (ref 3.5–5.1)
Sodium: 136 mEq/L (ref 135–145)

## 2011-02-28 LAB — CBC
HCT: 42 % (ref 39.0–52.0)
Hemoglobin: 14.1 g/dL (ref 13.0–17.0)
MCH: 27.2 pg (ref 26.0–34.0)
MCHC: 33.6 g/dL (ref 30.0–36.0)
MCV: 80.9 fL (ref 78.0–100.0)
Platelets: 187 10*3/uL (ref 150–400)
RBC: 5.19 MIL/uL (ref 4.22–5.81)
RDW: 15.3 % (ref 11.5–15.5)
WBC: 8 10*3/uL (ref 4.0–10.5)

## 2011-02-28 LAB — DIFFERENTIAL
Basophils Absolute: 0 10*3/uL (ref 0.0–0.1)
Basophils Relative: 0 % (ref 0–1)
Eosinophils Absolute: 0.1 10*3/uL (ref 0.0–0.7)
Eosinophils Relative: 1 % (ref 0–5)
Lymphocytes Relative: 14 % (ref 12–46)
Lymphs Abs: 1.1 10*3/uL (ref 0.7–4.0)
Monocytes Absolute: 0.9 10*3/uL (ref 0.1–1.0)
Monocytes Relative: 12 % (ref 3–12)
Neutro Abs: 5.8 10*3/uL (ref 1.7–7.7)
Neutrophils Relative %: 73 % (ref 43–77)

## 2011-03-03 NOTE — Op Note (Signed)
NAMECHADLEY, RICARTE NO.:  [id number]  MEDICAL RECORD NO.:  [id number]  LOCATION:  [id number]                         FACILITY:  [facility]  PHYSICIAN:  Gwenyth Ober, M.D.    DATE OF BIRTH:  04-Jul-1935  DATE OF PROCEDURE:  02/26/2011 DATE OF DISCHARGE:                              OPERATIVE REPORT   PREOPERATIVE DIAGNOSES:  Supraumbilical hernia and right inguinal hernia.  PREOPERATIVE DIAGNOSES:  Supraumbilical 3-cm hernia, umbilical 1-cm hernia, and pantaloon-type direct and indirect right inguinal hernia.  PROCEDURES: 1. Repair of supraumbilical and umbilical hernia with circular 6.4-cm     Proceed patch. 2. Open right inguinal hernia repair with mesh.  SURGEON:  Gwenyth Ober, MD  ASSISTANT:  None.  ANESTHESIA:  General endotracheal.  ESTIMATED BLOOD LOSS:  Less than 50 mL.  COMPLICATIONS:  None.  CONDITION:  Stable.  FINDINGS:  The patient had 2 supraumbilical hernia defects, 2 periumbilical defects, one supraumbilical about 3 cm and the umbilical one about a centimeter in size.  They were repaired in continuity with a single piece of 6.4 cm circular Proceed patch.  The patient's right inguinal hernia was large.  The biggest component was a direct hernia with almost the femoral component.  There was a pantaloon extension along the spermatic cord which was resected.  It was repaired with mesh.  INDICATIONS FOR OPERATION:  The patient is a 76 year old gentleman otherwise healthy with umbilical and right inguinal hernia who comes in now for repair.  It was felt as though previously that he had previous repair, but that was on the left side.  This was not a recurrent hernia on the right.  OPERATION:  The patient was taken to the operating room and placed on the table in supine position.  After an adequate general endotracheal anesthetic was administered, he was prepped and draped in the usual sterile manner exposing the entire abdomen.  After  proper time-out was performed identifying the patient and procedure to be performed, we started up with the umbilical hernia and supraumbilical hernia repair.  We made approximately a 6-cm transverse incision at the upper portion of the umbilicus and took it down to the subcutaneous tissue.  We first encountered the umbilical hernia.  This was thought possibly to be the only defect, however, upon exploring this area  we found that there was a much larger defect just above that and separated by a bridge of fascia approximately a centimeter and half long.  We dissected out the umbilical hernia initially and circumferentially created flaps in the subcu on top of the fascia.  We opened the sac and removed it from the field.  As we palpated intraperitoneally underneath the fascia, we could palpate the other supraumbilical hernia defect, which was about three times the size of the umbilical defect.  We subsequently in the subcu dissected out the large hernia sac.  We dissected down to the fascial hernia sac interface and then resected the sac.  This allowed [place] to open up the defect and we cut the bridge of fascia between the two removing a piece of fascia about a centimeter squared.  Once we had adequate flaps  circumferentially around the supraumbilical and umbilical defect, we sutured in an old circular piece of Proceed patch of 6.3 cm which had been soaked in antibiotic solution.  Prior to performing this repair, we had opened a small piece of the patch thinking that the umbilical defect was the only one we did not use that one.  We used the patch in an inlay manner using horizontal stitches of old Novafil to tack it up to the internal part of the abdominal wall.  A total of 8 such tacking sutures were used.  We then closed the fascia on top of using simple stitches of old Novafil suture.  The wings of the patch were also attached to the fascia.  We used antibiotic solution to irrigate  the area.  Once it was repaired, we closed in several layers of 3-0 Vicryl deep layer, then to invert the umbilical umbilicus we connected the base of the umbilicus to the subcu by using 3-0 Vicryl. We injected 0.25% Marcaine without epi into the subcu and we closed the skin using running subcuticular stitch of 4-0 Monocryl.  Dermabond, Steri-Strips, and Tegaderm were used to complete the dressing.  The right inguinal hernia repair was performed subsequently.  The surgeon had to switch the gloves during the procedure as the glove was perforated by an Ethibond needle.  This only perforated the external glove, not the internal indicator.  We made a transverse curvilinear incision approximately 6-7 cm long using a #10 blade and then dissected down to the external oblique fascia.  There was a large hernia protruding out of the superficial ring.  We had opened the fascia using Metzenbaum scissors through the ring and subsequently dissected out the spermatic cord at the pubic tubercle encircling it with a Penrose drain.  We were able to dissect away the large direct hernia sac away from the spermatic cord and as we did so we saw that on the anteromedial aspect there was a pantaloon extension of the hernia along the spermatic cord.  We resected this at the base using 0 Ethibond suture.  At the large hernia defect, we imbricated on itself by initially attaching the conjoined tendon to the [facility] ligament which we dissected out below the reflected portion of the inguinal ligament.  This was done initially to keep the hernia in place.  We subsequently did a mesh repair.  We attached it using interrupted simple stitches of 0 Ethibond suture.  Once this was done, we irrigated the wound with antibiotic solution and used an oval piece of mesh approximately 4 x 6 cm in size attaching it to the conjoined tendon anteromedially and the reflected portion of the inguinal ligament inferolaterally.  This  was done so using 0 Prolene suture.  Prior to implanting the mesh, we actually performed a relaxing incision along the upper medial aspect of the internal oblique muscle. Once the mesh was sutured in place using an 0 Prolene suture, we irrigated with antibiotic solution, placed the cord back into the canal, reapproximated the external oblique fascia on top of the cord using running 3-0 Vicryl suture.  Scarpa fascia was then reapproximated using three interrupted sutures of 3-0 Vicryl.  The skin was then closed using running subcuticular stitch of 4-0 Monocryl after we injected 0.25% Marcaine without epi into the wound.  All counts were correct.  Dermabond, Steri-Strips, and Tegaderm were used to complete the dressing.     Gwenyth Ober, M.D.     JOW/MEDQ  D:  02/26/2011  T:  02/27/2011  Job:  [id number]  Electronically Signed by Judeth Horn M.D. on 03/03/2011 09:52:22 PM

## 2011-03-10 ENCOUNTER — Ambulatory Visit (INDEPENDENT_AMBULATORY_CARE_PROVIDER_SITE_OTHER): Payer: Medicare HMO | Admitting: General Surgery

## 2011-03-10 ENCOUNTER — Encounter (INDEPENDENT_AMBULATORY_CARE_PROVIDER_SITE_OTHER): Payer: Self-pay | Admitting: General Surgery

## 2011-03-10 DIAGNOSIS — Z09 Encounter for follow-up examination after completed treatment for conditions other than malignant neoplasm: Secondary | ICD-10-CM | POA: Insufficient documentation

## 2011-03-10 NOTE — Progress Notes (Signed)
HPI The patient is status post umbilical and supraumbilical hernia repair with mesh. He also had an open right inguinal hernia repair. He is doing very well. No complaint  PE On examination the patient has a significant amount of ecchymoses in his periumbilical area. There is no recurrence of hernia there appeared a right inguinal hernia repair site looks fine. Both the dressings were removed. There is no evidence of recurrent hernia in his right lower quadrant either  Study review There are no studies to review.  Assessment Status post supraumbilical umbilical and right inguinal hernia repairs with mesh. Doing well postoperatively .  Plan The patient does not require any additional followup. I will see him again in the future on a p.r.n. basis

## 2011-03-31 NOTE — Discharge Summary (Signed)
  NAMEJAYVON, Trevor Murphy                  ACCOUNT NO.:  [id number]  MEDICAL RECORD NO.:  [id number]  LOCATION:  [id number]                         FACILITY:  [facility]  PHYSICIAN:  Gwenyth Ober, M.D.    DATE OF BIRTH:  04/28/1935  DATE OF ADMISSION:  02/26/2011 DATE OF DISCHARGE:  02/28/2011                              DISCHARGE SUMMARY   DISCHARGE DIAGNOSES: 1. Supraumbilical and umbilical hernia. 2. Right inguinal hernia. 3. Hypertension. 4. Urinary retention postoperatively.  PRINCIPAL PROCEDURES:  Open supraumbilical and umbilical hernia repair with mesh and also right inguinal hernia repair with mesh.  SURGEON:  Gwenyth Ober, MD.  DISCHARGE MEDICATIONS: 1. Metoprolol 25 mg p.o. b.i.d. 2. Percocet p.r.n. for pain. 3. Allopurinol as taken preoperatively. 4. Crestor as taken preoperatively. 5. Clonidine 0.2 mg tablets 1 p.o. b.i.d. 6. Lisinopril and hydrochlorothiazide tablets as taken preoperatively. 7. Tramadol 50 mg p.o. q.6 h. p.r.n. for pain.  His diet on discharge is regular.  CONDITION:  Stable.  BRIEF SUMMARY OF HOSPITAL COURSE:  The patient was admitted after an open right inguinal hernia repair and supraumbilical and umbilical hernia repair both with mesh.  Postoperatively, the patient did well.  However, he had some postoperative urinary retention which was treated with in-and-out catheterization and subsequent resolution.  The patient also had some postoperative hypertension.  He had been known to have some preoperative hypertension for which he was placed on medications prior to the surgery.  Followup for this will be with his primary care physician.  This was treated with the addition of clonidine 0.2 mg p.o. b.i.d. and an increase in his metoprolol.  At the time of discharge his blood pressure was under much better control.  At the time of discharge he is voiding well, eating well, and ambulating well.  He was returned to see me in 2 weeks.     Gwenyth Ober, M.D.     JOW/MEDQ  D:  03/23/2011  T:  03/24/2011  Job:  [id number]  Electronically Signed by Judeth Horn M.D. on 03/31/2011 12:03:05 AM

## 2011-10-15 ENCOUNTER — Encounter (HOSPITAL_COMMUNITY): Payer: Self-pay | Admitting: Nurse Practitioner

## 2011-10-15 ENCOUNTER — Emergency Department (HOSPITAL_COMMUNITY)
Admission: EM | Admit: 2011-10-15 | Discharge: 2011-10-15 | Disposition: A | Discharge status: Home / Self Care | Payer: Medicare Other | Attending: Emergency Medicine | Admitting: Emergency Medicine

## 2011-10-15 DIAGNOSIS — M109 Gout, unspecified: Secondary | ICD-10-CM | POA: Insufficient documentation

## 2011-10-15 DIAGNOSIS — I10 Essential (primary) hypertension: Secondary | ICD-10-CM | POA: Insufficient documentation

## 2011-10-15 MED ORDER — COLCHICINE 0.6 MG PO TABS: 0.6000 mg | ORAL_TABLET | Freq: Two times a day (BID) | ORAL | Status: DC

## 2011-10-15 MED ORDER — OXYCODONE-ACETAMINOPHEN 5-325 MG PO TABS: 2.0000 | ORAL_TABLET | ORAL | Status: AC | PRN

## 2011-10-15 MED ORDER — IBUPROFEN 800 MG PO TABS
800.0000 mg | ORAL_TABLET | Freq: Once | ORAL | Status: AC
  Administered 2011-10-15: 800 mg via ORAL
  Filled 2011-10-15: qty 1

## 2011-10-15 MED ORDER — HYDROCODONE-ACETAMINOPHEN 5-325 MG PO TABS
2.0000 | ORAL_TABLET | Freq: Once | ORAL | Status: AC
  Administered 2011-10-15: 2 via ORAL
  Filled 2011-10-15: qty 1

## 2011-10-15 NOTE — Discharge Instructions (Signed)
Return here tomorrow for a recheck  Gout Gout is an inflammatory condition (arthritis) caused by a buildup of uric acid crystals in the joints. Uric acid is a chemical that is normally present in the blood. Under some circumstances, uric acid can form into crystals in your joints. This causes joint redness, soreness, and swelling (inflammation). Repeat attacks are common. Over time, uric acid crystals can form into masses (tophi) near a joint, causing disfigurement. Gout is treatable and often preventable. CAUSES  The disease begins with elevated levels of uric acid in the blood. Uric acid is produced by your body when it breaks down a naturally found substance called purines. This also happens when you eat certain foods such as meats and fish. Causes of an elevated uric acid level include:  Being passed down from parent to child (heredity).   Diseases that cause increased uric acid production (obesity, psoriasis, some cancers).   Excessive alcohol use.   Diet, especially diets rich in meat and seafood.   Medicines, including certain cancer-fighting drugs (chemotherapy), diuretics, and aspirin.   Chronic kidney disease. The kidneys are no longer able to remove uric acid well.   Problems with metabolism.  Conditions strongly associated with gout include:  Obesity.   High blood pressure.   High cholesterol.   Diabetes.  Not everyone with elevated uric acid levels gets gout. It is not understood why some people get gout and others do not. Surgery, joint injury, and eating too much of certain foods are some of the factors that can lead to gout. SYMPTOMS   An attack of gout comes on quickly. It causes intense pain with redness, swelling, and warmth in a joint.   Fever can occur.   Often, only one joint is involved. Certain joints are more commonly involved:   Base of the big toe.   Knee.   Ankle.   Wrist.   Finger.  Without treatment, an attack usually goes away in a few  days to weeks. Between attacks, you usually will not have symptoms, which is different from many other forms of arthritis. DIAGNOSIS  Your caregiver will suspect gout based on your symptoms and exam. Removal of fluid from the joint (arthrocentesis) is done to check for uric acid crystals. Your caregiver will give you a medicine that numbs the area (local anesthetic) and use a needle to remove joint fluid for exam. Gout is confirmed when uric acid crystals are seen in joint fluid, using a special microscope. Sometimes, blood, urine, and X-ray tests are also used. TREATMENT  There are 2 phases to gout treatment: treating the sudden onset (acute) attack and preventing attacks (prophylaxis). Treatment of an Acute Attack  Medicines are used. These include anti-inflammatory medicines or steroid medicines.   An injection of steroid medicine into the affected joint is sometimes necessary.   The painful joint is rested. Movement can worsen the arthritis.   You may use warm or cold treatments on painful joints, depending which works best for you.   Discuss the use of coffee, vitamin C, or cherries with your caregiver. These may be helpful treatment options.  Treatment to Prevent Attacks After the acute attack subsides, your caregiver may advise prophylactic medicine. These medicines either help your kidneys eliminate uric acid from your body or decrease your uric acid production. You may need to stay on these medicines for a very long time. The early phase of treatment with prophylactic medicine can be associated with an increase in acute gout attacks. For  this reason, during the first few months of treatment, your caregiver may also advise you to take medicines usually used for acute gout treatment. Be sure you understand your caregiver's directions. You should also discuss dietary treatment with your caregiver. Certain foods such as meats and fish can increase uric acid levels. Other foods such as dairy  can decrease levels. Your caregiver can give you a list of foods to avoid. HOME CARE INSTRUCTIONS   Do not take aspirin to relieve pain. This raises uric acid levels.   Only take over-the-counter or prescription medicines for pain, discomfort, or fever as directed by your caregiver.   Rest the joint as much as possible. When in bed, keep sheets and blankets off painful areas.   Keep the affected joint raised (elevated).   Use crutches if the painful joint is in your leg.   Drink enough water and fluids to keep your urine clear or pale yellow. This helps your body get rid of uric acid. Do not drink alcoholic beverages. They slow the passage of uric acid.   Follow your caregiver's dietary instructions. Pay careful attention to the amount of protein you eat. Your daily diet should emphasize fruits, vegetables, whole grains, and fat-free or low-fat milk products.   Maintain a healthy body weight.  SEEK MEDICAL CARE IF:   You have an oral temperature above 102 F (38.9 C).   You develop diarrhea, vomiting, or any side effects from medicines.   You do not feel better in 24 hours, or you are getting worse.  SEEK IMMEDIATE MEDICAL CARE IF:   Your joint becomes suddenly more tender and you have:   Chills.   An oral temperature above 102 F (38.9 C), not controlled by medicine.  MAKE SURE YOU:   Understand these instructions.   Will watch your condition.   Will get help right away if you are not doing well or get worse.  Document Released: 07/24/2000 Document Revised: 07/16/2011 Document Reviewed: 11/04/2009 [facility] Patient Information 2012 [place].

## 2011-10-15 NOTE — ED Notes (Signed)
Pt reports swelling, redness, pain in L hand onset 2 nights ago. "Feels like gout." history of gout. Denies any injuries. Cms intact. Hx of htn and takes meds as instructed but bp is eleVATED IN TRIAGE

## 2011-10-15 NOTE — ED Provider Notes (Signed)
History     CSN: [id number]  Arrival date & time 10/15/11  1126   First MD Initiated Contact with Patient 10/15/11 1253      Chief Complaint  Patient presents with  . Gout    (Consider location/radiation/quality/duration/timing/severity/associated sxs/prior treatment) The history is provided by the patient.   patient here with left hand pain and wrist pain which began acute today. Similar to his gout he's had before in the past. He used his allopurinol and Ultram at home without relief of his symptoms. Denies any fever or recent injury. Pain is characterized as being sharp and worse with movement made better with nothing  Past Medical History  Diagnosis Date  . Hypertension   . Poor circulation   . Hernia     umbilical  . Gout     Past Surgical History  Procedure Date  . Hernia repair     History reviewed. No pertinent family history.  History  Substance Use Topics  . Smoking status: Never Smoker   . Smokeless tobacco: Not on file  . Alcohol Use: No      Review of Systems  All other systems reviewed and are negative.    Allergies  Review of patient's allergies indicates no known allergies.  Home Medications   Current Outpatient Rx  Name Route Sig Dispense Refill  . ALLOPURINOL 100 MG PO TABS Oral Take 100 mg by mouth daily.      [name] Kitchen CLONIDINE HCL 0.2 MG PO TABS Oral Take 0.2 mg by mouth 2 (two) times daily.      [name] Kitchen LISINOPRIL-HYDROCHLOROTHIAZIDE 20-25 MG PO TABS Oral Take 1 tablet by mouth daily.      [name] Kitchen ROSUVASTATIN CALCIUM 5 MG PO TABS Oral Take 5 mg by mouth daily.     . TRAMADOL HCL 50 MG PO TABS Oral Take 50 mg by mouth as needed. For pain      BP 187/108  Pulse 119  Temp(Src) 98.4 F (36.9 C) (Oral)  Resp 20  SpO2 99%  Physical Exam  Nursing note and vitals reviewed. Constitutional: He is oriented to person, place, and time. He appears well-developed and well-nourished.  Non-toxic appearance.  HENT:  Head: Normocephalic and atraumatic.  Eyes:  Conjunctivae are normal. Pupils are equal, round, and reactive to light.  Neck: Normal range of motion.  Cardiovascular: Tachycardia present.   Pulmonary/Chest: Effort normal.  Musculoskeletal:       Left wrist and hand with tenderness some erythema, no crepitus or blisters noted. Pain localized to the wrist joint.  Neurological: He is alert and oriented to person, place, and time.  Skin: Skin is warm and dry.  Psychiatric: He has a normal mood and affect.    ED Course  Procedures (including critical care time)  Labs Reviewed - No data to display No results found.   No diagnosis found.    MDM  Pt given pain meds and feels better--left hand and wrist re-checked and no change in erythema, still suspect gout ( erythema is over 2nd mcp and wrist), doubt spetic arthritis, will give pt colchicine and pain meds and have him return tomorrow for a recheck--pt to take his htn meds when he gets home        [name], MD 10/15/11 1411

## 2011-10-27 ENCOUNTER — Emergency Department (HOSPITAL_COMMUNITY)
Admission: EM | Admit: 2011-10-27 | Discharge: 2011-10-27 | Disposition: A | Discharge status: Home / Self Care | Payer: Medicare Other | Attending: Emergency Medicine | Admitting: Emergency Medicine

## 2011-10-27 ENCOUNTER — Emergency Department (HOSPITAL_COMMUNITY): Payer: Medicare Other

## 2011-10-27 ENCOUNTER — Encounter (HOSPITAL_COMMUNITY): Payer: Self-pay

## 2011-10-27 ENCOUNTER — Other Ambulatory Visit: Payer: Self-pay

## 2011-10-27 DIAGNOSIS — H543 Unqualified visual loss, both eyes: Secondary | ICD-10-CM | POA: Insufficient documentation

## 2011-10-27 DIAGNOSIS — I10 Essential (primary) hypertension: Secondary | ICD-10-CM | POA: Insufficient documentation

## 2011-10-27 DIAGNOSIS — Z79899 Other long term (current) drug therapy: Secondary | ICD-10-CM | POA: Insufficient documentation

## 2011-10-27 DIAGNOSIS — Z862 Personal history of diseases of the blood and blood-forming organs and certain disorders involving the immune mechanism: Secondary | ICD-10-CM | POA: Insufficient documentation

## 2011-10-27 DIAGNOSIS — R42 Dizziness and giddiness: Secondary | ICD-10-CM

## 2011-10-27 DIAGNOSIS — R51 Headache: Secondary | ICD-10-CM | POA: Insufficient documentation

## 2011-10-27 DIAGNOSIS — H8190 Unspecified disorder of vestibular function, unspecified ear: Secondary | ICD-10-CM | POA: Insufficient documentation

## 2011-10-27 DIAGNOSIS — Z8639 Personal history of other endocrine, nutritional and metabolic disease: Secondary | ICD-10-CM | POA: Insufficient documentation

## 2011-10-27 LAB — BASIC METABOLIC PANEL
BUN: 32 mg/dL — ABNORMAL HIGH (ref 6–23)
CO2: 25 mEq/L (ref 19–32)
Calcium: 9.7 mg/dL (ref 8.4–10.5)
Chloride: 102 mEq/L (ref 96–112)
Creatinine, Ser: 1.66 mg/dL — ABNORMAL HIGH (ref 0.50–1.35)
GFR calc Af Amer: 45 mL/min — ABNORMAL LOW (ref >90)
GFR calc non Af Amer: 39 mL/min — ABNORMAL LOW (ref >90)
Glucose, Bld: 117 mg/dL — ABNORMAL HIGH (ref 70–99)
Potassium: 4.5 mEq/L (ref 3.5–5.1)
Sodium: 138 mEq/L (ref 135–145)

## 2011-10-27 LAB — DIFFERENTIAL
Basophils Absolute: 0 10*3/uL (ref 0.0–0.1)
Basophils Relative: 0 % (ref 0–1)
Eosinophils Absolute: 0.1 10*3/uL (ref 0.0–0.7)
Eosinophils Relative: 1 % (ref 0–5)
Lymphocytes Relative: 14 % (ref 12–46)
Lymphs Abs: 1.1 10*3/uL (ref 0.7–4.0)
Monocytes Absolute: 0.5 10*3/uL (ref 0.1–1.0)
Monocytes Relative: 6 % (ref 3–12)
Neutro Abs: 6.6 10*3/uL (ref 1.7–7.7)
Neutrophils Relative %: 79 % — ABNORMAL HIGH (ref 43–77)

## 2011-10-27 LAB — URINALYSIS, ROUTINE W REFLEX MICROSCOPIC
Bilirubin Urine: NEGATIVE
Glucose, UA: NEGATIVE mg/dL
Hgb urine dipstick: NEGATIVE
Ketones, ur: NEGATIVE mg/dL
Leukocytes, UA: NEGATIVE
Nitrite: NEGATIVE
Protein, ur: NEGATIVE mg/dL
Specific Gravity, Urine: 1.015 (ref 1.005–1.030)
Urobilinogen, UA: 0.2 mg/dL (ref 0.0–1.0)
pH: 5.5 (ref 5.0–8.0)

## 2011-10-27 LAB — CBC
HCT: 47.8 % (ref 39.0–52.0)
Hemoglobin: 16 g/dL (ref 13.0–17.0)
MCH: 26.8 pg (ref 26.0–34.0)
MCHC: 33.5 g/dL (ref 30.0–36.0)
MCV: 80.2 fL (ref 78.0–100.0)
Platelets: 228 10*3/uL (ref 150–400)
RBC: 5.96 MIL/uL — ABNORMAL HIGH (ref 4.22–5.81)
RDW: 14.3 % (ref 11.5–15.5)
WBC: 8.4 10*3/uL (ref 4.0–10.5)

## 2011-10-27 MED ORDER — MECLIZINE HCL 25 MG PO TABS
25.0000 mg | ORAL_TABLET | Freq: Once | ORAL | Status: AC
  Administered 2011-10-27: 25 mg via ORAL
  Filled 2011-10-27: qty 1

## 2011-10-27 MED ORDER — IBUPROFEN 800 MG PO TABS
800.0000 mg | ORAL_TABLET | Freq: Once | ORAL | Status: AC
  Administered 2011-10-27: 800 mg via ORAL
  Filled 2011-10-27: qty 1

## 2011-10-27 MED ORDER — MECLIZINE HCL 50 MG PO TABS: 50.0000 mg | ORAL_TABLET | Freq: Three times a day (TID) | ORAL | Status: AC | PRN

## 2011-10-27 NOTE — ED Provider Notes (Signed)
History     CSN: [id number]  Arrival date & time 10/27/11  1317   First MD Initiated Contact with Patient 10/27/11 1717      Chief Complaint  Patient presents with  . Dizziness    (Consider location/radiation/quality/duration/timing/severity/associated sxs/prior treatment) The history is provided by the patient, a relative and medical records.   the patient is a 77 year old, male, with a history of hypertension, who presents to emergency department complaining of dizziness since this morning.  He says when he stood up.  This morning.  A brief episode of loss of vision, which lasted only a few seconds.  He states that he has a headache now.  He denies pain anywhere else.  He has not had nausea, vomiting, weakness, or paresthesias.  He describes his dizziness as the world was spinning.  He denies recent earaches, sore throat, or congestion.  He has not had this before.  He does not smoke cigarettes.  He does not take anticoagulants.  Few weeks ago.  He was seen for gout, and he was prescribed colchicine, according to the note from Dr. Zenia Resides,  for his gout.  He denies any recent illnesses  Past Medical History  Diagnosis Date  . Hypertension   . Poor circulation   . Hernia     umbilical  . Gout     Past Surgical History  Procedure Date  . Hernia repair     No family history on file.  History  Substance Use Topics  . Smoking status: Never Smoker   . Smokeless tobacco: Not on file  . Alcohol Use: No      Review of Systems  Constitutional: Negative for fever and chills.  HENT: Negative for neck pain.   Eyes: Positive for visual disturbance.       He said he had vision, loss for a few seconds.  This morning  Respiratory: Negative for cough, chest tightness and shortness of breath.   Cardiovascular: Negative for chest pain and palpitations.  Gastrointestinal: Negative for nausea, vomiting, abdominal pain and diarrhea.  Neurological: Positive for dizziness and headaches.  Negative for weakness and light-headedness.  Psychiatric/Behavioral: Negative for confusion.  All other systems reviewed and are negative.    Allergies  Review of patient's allergies indicates no known allergies.  Home Medications   Current Outpatient Rx  Name Route Sig Dispense Refill  . ALLOPURINOL 100 MG PO TABS Oral Take 100 mg by mouth daily.      [name] Kitchen BISMUTH SUBSALICYLATE [id number] PO SUSP Oral Take 15 mLs by mouth every 6 (six) hours as needed. For upset stomach    . CLONIDINE HCL 0.2 MG PO TABS Oral Take 0.2 mg by mouth 2 (two) times daily.      . COLCHICINE 0.6 MG PO TABS Oral Take 1 tablet (0.6 mg total) by mouth 2 (two) times daily. 30 tablet 0  . LISINOPRIL-HYDROCHLOROTHIAZIDE 20-25 MG PO TABS Oral Take 1 tablet by mouth daily.      [name] Kitchen ROSUVASTATIN CALCIUM 5 MG PO TABS Oral Take 5 mg by mouth daily.       BP 177/90  Pulse 85  Temp(Src) 98.6 F (37 C) (Oral)  Resp 20  Ht 5\' 10"  (1.778 m)  Wt 140 lb (63.504 kg)  BMI 20.09 kg/m2  SpO2 100%  Physical Exam  Vitals reviewed. Constitutional: He is oriented to person, place, and time. He appears well-developed and well-nourished.  HENT:  Head: Normocephalic and atraumatic.  Right Ear: External ear normal.  Left Ear: External ear normal.  Eyes: Conjunctivae are normal. Pupils are equal, round, and reactive to light.  Neck: Normal range of motion. Neck supple.       No carotid bruits  Cardiovascular: Normal rate.   No murmur heard. Pulmonary/Chest: Effort normal. No respiratory distress.  Abdominal: Soft. Bowel sounds are normal. There is no tenderness.  Musculoskeletal: Normal range of motion.  Neurological: He is alert and oriented to person, place, and time. He has normal strength. He displays no tremor. No cranial nerve deficit. He displays a negative Romberg sign. Coordination normal.       No pronator drift  Skin: Skin is warm and dry.  Psychiatric: He has a normal mood and affect. Judgment and thought content  normal.    ED Course  Procedures (including critical care time) 77 year old, male, with history of hypertension, presents to emergency department complaining of headache, and dizziness since this morning.  He also had a brief episode of vision, loss.  This morning, his neurological examination is normal.  He has normal mental status and we will perform laboratory testing.  An EKG, and a CAT scan.  I will give him ibuprofen for his headache and meclizine for the dizziness, and after his tests returned.  We will make decisions about further evaluation and disposition   Labs Reviewed  CBC  DIFFERENTIAL  BASIC METABOLIC PANEL  URINALYSIS, ROUTINE W REFLEX MICROSCOPIC   No results found.   No diagnosis found.   7:24 PM Dizziness decreased. Pt ambulated in ed without assistance.  Spoke with dr. Nicole Kindred. He thinks pt can go home with meclizine.   MDM  Headache Dizzy No ataxia, nl neuro exam. No stroke on ct.        Barbara Cower, MD 10/27/11 1925

## 2011-10-27 NOTE — ED Notes (Signed)
Pt. Has elevated BP,. And is taking his BP meds, but this am has an episode that he loss his vision for a few seconds.  Presently he is having blurred vision and dizziness. Pt. Is alert and oriented X3, speech is clear. No weakness noted

## 2011-10-27 NOTE — ED Notes (Signed)
Patient presents with hypertension despite taking his blood pressure medication as directed.  Patient denies chest pain, nausea, vomiting.  Patient resting quietly, skin warm, dry and intact; patient currently hypertensive.

## 2011-10-27 NOTE — Discharge Instructions (Signed)
Your CAT scan does not show any signs of a stroke.  Your blood tests, and urine tests do not show any significant illnesses.  Use meclizine for dizziness.  Followup with your Dr. reevaluation.  If your symptoms.  Last more than 2-3 days.  Return for worse or uncontrolled symptoms

## 2012-02-16 ENCOUNTER — Encounter (HOSPITAL_COMMUNITY): Payer: Self-pay | Admitting: Pharmacy Technician

## 2012-02-16 ENCOUNTER — Ambulatory Visit (INDEPENDENT_AMBULATORY_CARE_PROVIDER_SITE_OTHER): Payer: Medicare Other | Admitting: General Surgery

## 2012-02-16 ENCOUNTER — Encounter (INDEPENDENT_AMBULATORY_CARE_PROVIDER_SITE_OTHER): Payer: Self-pay | Admitting: General Surgery

## 2012-02-16 VITALS — BP 164/104 | HR 74 | Temp 97.2°F | Resp 16 | Ht 70.0 in | Wt 164.0 lb

## 2012-02-16 DIAGNOSIS — K432 Incisional hernia without obstruction or gangrene: Secondary | ICD-10-CM

## 2012-02-16 NOTE — Progress Notes (Signed)
HPI The patient comes in with a recurrent supraumbilical hernia.  PE On examination he has about a 3-4 cm defect with protruding fat and omentum.  Studiy review No studies are required at this time.  Assessment Recurrent supraumbilical ventral hernia. This is just above the site where he had his previous umbilical hernia repaired before. The patient also had a diastases recti and probably the cause of the weakness of his fascia in that area the hernia did recur.  Plan Laparoscopic ventral hernia repair. This will be done as an outpatient.

## 2012-02-18 ENCOUNTER — Telehealth (INDEPENDENT_AMBULATORY_CARE_PROVIDER_SITE_OTHER): Payer: Self-pay | Admitting: General Surgery

## 2012-02-18 NOTE — Telephone Encounter (Signed)
[facility] FROM [facility] CALLED TO REQUEST ORDERS BE PUT IN EPIC FOR PT Trevor Murphy/DOB Jun 24, 1935/ SURGERY IS 02-25-12/ [facility] [place]- [phone]/GY

## 2012-02-19 ENCOUNTER — Encounter (HOSPITAL_COMMUNITY): Payer: Self-pay | Admitting: Anesthesiology

## 2012-02-19 ENCOUNTER — Encounter (HOSPITAL_COMMUNITY)
Admission: RE | Admit: 2012-02-19 | Discharge: 2012-02-19 | Disposition: A | Discharge status: Home / Self Care | Payer: Medicare Other | Source: Ambulatory Visit | Attending: General Surgery | Admitting: General Surgery

## 2012-02-19 ENCOUNTER — Encounter (HOSPITAL_COMMUNITY): Payer: Self-pay

## 2012-02-19 DIAGNOSIS — Z01812 Encounter for preprocedural laboratory examination: Secondary | ICD-10-CM | POA: Insufficient documentation

## 2012-02-19 DIAGNOSIS — Z01818 Encounter for other preprocedural examination: Secondary | ICD-10-CM | POA: Insufficient documentation

## 2012-02-19 HISTORY — DX: Unspecified glaucoma: H40.9

## 2012-02-19 HISTORY — DX: Unspecified osteoarthritis, unspecified site: M19.90

## 2012-02-19 LAB — CBC
HCT: 46.2 % (ref 39.0–52.0)
Hemoglobin: 14.7 g/dL (ref 13.0–17.0)
MCH: 26.5 pg (ref 26.0–34.0)
MCHC: 31.8 g/dL (ref 30.0–36.0)
MCV: 83.2 fL (ref 78.0–100.0)
Platelets: 147 10*3/uL — ABNORMAL LOW (ref 150–400)
RBC: 5.55 MIL/uL (ref 4.22–5.81)
RDW: 17.7 % — ABNORMAL HIGH (ref 11.5–15.5)
WBC: 4.8 10*3/uL (ref 4.0–10.5)

## 2012-02-19 LAB — BASIC METABOLIC PANEL
BUN: 20 mg/dL (ref 6–23)
CO2: 25 mEq/L (ref 19–32)
Calcium: 9.3 mg/dL (ref 8.4–10.5)
Chloride: 106 mEq/L (ref 96–112)
Creatinine, Ser: 1.57 mg/dL — ABNORMAL HIGH (ref 0.50–1.35)
GFR calc Af Amer: 48 mL/min — ABNORMAL LOW (ref >90)
GFR calc non Af Amer: 42 mL/min — ABNORMAL LOW (ref >90)
Glucose, Bld: 102 mg/dL — ABNORMAL HIGH (ref 70–99)
Potassium: 4.5 mEq/L (ref 3.5–5.1)
Sodium: 143 mEq/L (ref 135–145)

## 2012-02-19 LAB — SURGICAL PCR SCREEN
MRSA, PCR: NEGATIVE
Staphylococcus aureus: NEGATIVE

## 2012-02-19 NOTE — Progress Notes (Signed)
Spoke with Ebony Hail regarding HTN during PAT visit. Ebony Hail to review chart and labs. PCP Dr. Marcha Dutton on [address].

## 2012-02-19 NOTE — Consult Note (Signed)
Anesthesiology Chart Review:  77 y/o male for laparoscopic repair of recurrent ventral hernia  on 02/25/12. He had a previous uneventful surgery 10/26/10. History includes htn, gout, and renal insufficiency with baseline creatinine in 1.4-1.6 range. Creatinine 1.57 today.  Should be OK for surgery on 02/25/12.  Roberts Gaudy, MD

## 2012-02-19 NOTE — Pre-Procedure Instructions (Signed)
[place]  02/19/2012   Your procedure is scheduled on:  July 18  Report to [facility] at 10:30 AM.  Call this number if you have problems the morning of surgery: [phone]   Remember:   Do not eat or drink:After Midnight.  Take these medicines the morning of surgery with A SIP OF WATER: Clonidine   Do not wear jewelry, make-up or nail polish.  Do not wear lotions, powders, or perfumes. You may wear deodorant.  Do not shave 48 hours prior to surgery. Men may shave face and neck.  Do not bring valuables to the hospital.  Contacts, dentures or bridgework may not be worn into surgery.  Leave suitcase in the car. After surgery it may be brought to your room.  For patients admitted to the hospital, checkout time is 11:00 AM the day of discharge.   Patients discharged the day of surgery will not be allowed to drive home.  Name and phone number of your driver: Clark Damuth [phone]  Special Instructions: CHG Shower Use Special Wash: 1/2 bottle night before surgery and 1/2 bottle morning of surgery.   Please read over the following fact sheets that you were given: Pain Booklet, Coughing and Deep Breathing and Surgical Site Infection Prevention

## 2012-02-19 NOTE — Progress Notes (Signed)
Spoke with Pamala Hurry at [place] office regarding orders advised to use anesthesia orders and that she would have Dr. Hulen Skains place orders in as soon as he was able.

## 2012-02-20 ENCOUNTER — Other Ambulatory Visit (INDEPENDENT_AMBULATORY_CARE_PROVIDER_SITE_OTHER): Payer: Self-pay | Admitting: General Surgery

## 2012-02-20 MED ORDER — DEXTROSE 5 % IV SOLN: 2.0000 g | INTRAVENOUS | Status: DC

## 2012-02-23 NOTE — Consult Note (Signed)
Anesthesia Note:  Please see note by Dr. Linna Caprice.  I also reviewed patient's elevated BP with underling CKD with Anesthesiologist Dr. Glennon Mac.  His last two blood pressures this month were taken prior to him taking his morning anti-hypertensive medications.  Reportedly, he is on Clonidine 0.2 mg po BID and lisinopril/HCTZ (Prinzide) 20/25 mg po daily.  In light of his elevated BP, Dr. Glennon Mac agreed that patient should take all his antihypertensive medications on the morning of surgery if within the prescribed instructions.  When I called to notify Mr. Yildiz, he also reported that he was prescribed carvedilol 12.5 mg BID, but hadn't been taking it consistently (I don't think he knew what it was for).  I encouraged him to contact his PCP Dr. Marcha Dutton to clarify his anti-hypertensive regimen, and notified him that if he is suppose to being taking carvedilol as well then he should take this on the day of surgery too.   He is aware that he should be NPO after midnight the night before surgery, but can take his AM medications with sips of H20.  He was instructed to bring his medication bottles in with him on the day of surgery so his medication list can be updated in needed.  I'll ask our nursing staff to attempt to obtain Dr. Biagio Borg last office note.  Myra Gianotti, PA-C 02/23/12 1800

## 2012-02-24 ENCOUNTER — Emergency Department (HOSPITAL_COMMUNITY)
Admission: EM | Admit: 2012-02-24 | Discharge: 2012-02-24 | Disposition: A | Discharge status: Home / Self Care | Payer: Medicare Other | Attending: Emergency Medicine | Admitting: Emergency Medicine

## 2012-02-24 ENCOUNTER — Encounter (HOSPITAL_COMMUNITY): Payer: Self-pay | Admitting: *Deleted

## 2012-02-24 ENCOUNTER — Other Ambulatory Visit: Payer: Self-pay

## 2012-02-24 ENCOUNTER — Emergency Department (HOSPITAL_COMMUNITY): Payer: Medicare Other

## 2012-02-24 DIAGNOSIS — M109 Gout, unspecified: Secondary | ICD-10-CM | POA: Insufficient documentation

## 2012-02-24 DIAGNOSIS — K439 Ventral hernia without obstruction or gangrene: Secondary | ICD-10-CM | POA: Insufficient documentation

## 2012-02-24 DIAGNOSIS — R112 Nausea with vomiting, unspecified: Secondary | ICD-10-CM

## 2012-02-24 DIAGNOSIS — H409 Unspecified glaucoma: Secondary | ICD-10-CM | POA: Insufficient documentation

## 2012-02-24 DIAGNOSIS — M129 Arthropathy, unspecified: Secondary | ICD-10-CM | POA: Insufficient documentation

## 2012-02-24 DIAGNOSIS — I10 Essential (primary) hypertension: Secondary | ICD-10-CM

## 2012-02-24 DIAGNOSIS — K432 Incisional hernia without obstruction or gangrene: Secondary | ICD-10-CM

## 2012-02-24 LAB — CBC WITH DIFFERENTIAL/PLATELET
Basophils Absolute: 0 10*3/uL (ref 0.0–0.1)
Basophils Relative: 0 % (ref 0–1)
Eosinophils Absolute: 0 10*3/uL (ref 0.0–0.7)
Eosinophils Relative: 0 % (ref 0–5)
HCT: 46.6 % (ref 39.0–52.0)
Hemoglobin: 16 g/dL (ref 13.0–17.0)
Lymphocytes Relative: 7 % — ABNORMAL LOW (ref 12–46)
Lymphs Abs: 0.4 10*3/uL — ABNORMAL LOW (ref 0.7–4.0)
MCH: 28 pg (ref 26.0–34.0)
MCHC: 34.3 g/dL (ref 30.0–36.0)
MCV: 81.6 fL (ref 78.0–100.0)
Monocytes Absolute: 0.1 10*3/uL (ref 0.1–1.0)
Monocytes Relative: 2 % — ABNORMAL LOW (ref 3–12)
Neutro Abs: 5.4 10*3/uL (ref 1.7–7.7)
Neutrophils Relative %: 91 % — ABNORMAL HIGH (ref 43–77)
Platelets: 167 10*3/uL (ref 150–400)
RBC: 5.71 MIL/uL (ref 4.22–5.81)
RDW: 17.8 % — ABNORMAL HIGH (ref 11.5–15.5)
WBC: 6 10*3/uL (ref 4.0–10.5)

## 2012-02-24 LAB — COMPREHENSIVE METABOLIC PANEL
ALT: 16 U/L (ref 0–53)
AST: 28 U/L (ref 0–37)
Albumin: 4.1 g/dL (ref 3.5–5.2)
Alkaline Phosphatase: 89 U/L (ref 39–117)
BUN: 24 mg/dL — ABNORMAL HIGH (ref 6–23)
CO2: 20 mEq/L (ref 19–32)
Calcium: 9.4 mg/dL (ref 8.4–10.5)
Chloride: 99 mEq/L (ref 96–112)
Creatinine, Ser: 1.47 mg/dL — ABNORMAL HIGH (ref 0.50–1.35)
GFR calc Af Amer: 52 mL/min — ABNORMAL LOW (ref >90)
GFR calc non Af Amer: 45 mL/min — ABNORMAL LOW (ref >90)
Glucose, Bld: 194 mg/dL — ABNORMAL HIGH (ref 70–99)
Potassium: 3.9 mEq/L (ref 3.5–5.1)
Sodium: 141 mEq/L (ref 135–145)
Total Bilirubin: 0.5 mg/dL (ref 0.3–1.2)
Total Protein: 8.1 g/dL (ref 6.0–8.3)

## 2012-02-24 LAB — URINE MICROSCOPIC-ADD ON

## 2012-02-24 LAB — URINALYSIS, ROUTINE W REFLEX MICROSCOPIC
Bilirubin Urine: NEGATIVE
Glucose, UA: NEGATIVE mg/dL
Ketones, ur: 15 mg/dL — AB
Leukocytes, UA: NEGATIVE
Nitrite: NEGATIVE
Protein, ur: 100 mg/dL — AB
Specific Gravity, Urine: 1.022 (ref 1.005–1.030)
Urobilinogen, UA: 0.2 mg/dL (ref 0.0–1.0)
pH: 5 (ref 5.0–8.0)

## 2012-02-24 LAB — LIPASE, BLOOD: Lipase: 34 U/L (ref 11–59)

## 2012-02-24 MED ORDER — SODIUM CHLORIDE 0.9 % IV SOLN: Freq: Once | INTRAVENOUS | Status: DC

## 2012-02-24 MED ORDER — ONDANSETRON HCL 4 MG/2ML IJ SOLN
4.0000 mg | Freq: Once | INTRAMUSCULAR | Status: AC
  Administered 2012-02-24: 4 mg via INTRAVENOUS
  Filled 2012-02-24: qty 2

## 2012-02-24 MED ORDER — METOCLOPRAMIDE HCL 10 MG PO TABS: 10.0000 mg | ORAL_TABLET | Freq: Four times a day (QID) | ORAL | Status: DC | PRN

## 2012-02-24 MED ORDER — SODIUM CHLORIDE 0.9 % IV BOLUS (SEPSIS)
1000.0000 mL | Freq: Once | INTRAVENOUS | Status: AC
  Administered 2012-02-24: 1000 mL via INTRAVENOUS

## 2012-02-24 NOTE — Progress Notes (Signed)
Requested last office visit from Dr. Biagio Borg office.

## 2012-02-24 NOTE — ED Provider Notes (Signed)
History     CSN: [id number]  Arrival date & time 02/24/12  1112   First MD Initiated Contact with Patient 02/24/12 1140      Chief Complaint  Patient presents with  . Nausea  . Emesis    (Consider location/radiation/quality/duration/timing/severity/associated sxs/prior treatment) Patient is a 77 y.o. male presenting with vomiting. The history is provided by the patient.  Emesis   He is scheduled for surgery on an incisional hernia tomorrow. Last eye, he started having nausea and vomiting. He denies abdominal pain. He had a bowel movement this morning. He denies fever, chills, sweats. Nothing makes his symptoms better nothing makes him worse. He has not taken anything at home to try and help his symptoms. Of note, he is a heavy drinker in that he admits to going to a fifth of liquor every 2 days.  Past Medical History  Diagnosis Date  . Poor circulation   . Hernia     umbilical  . Gout   . Glaucoma   . Hypertension     does not see a cardiologist  . Arthritis     Past Surgical History  Procedure Date  . Hernia repair     No family history on file.  History  Substance Use Topics  . Smoking status: Never Smoker   . Smokeless tobacco: Not on file  . Alcohol Use: 0.6 oz/week    1 Cans of beer per week      Review of Systems  Gastrointestinal: Positive for vomiting.  All other systems reviewed and are negative.    Allergies  Review of patient's allergies indicates no known allergies.  Home Medications   Current Outpatient Rx  Name Route Sig Dispense Refill  . ALLOPURINOL 100 MG PO TABS Oral Take 100 mg by mouth daily.      [name] Kitchen BISMUTH SUBSALICYLATE [id number] PO SUSP Oral Take 15 mLs by mouth every 6 (six) hours as needed. For upset stomach    . CARVEDILOL 12.5 MG PO TABS Oral Take 12.5 mg by mouth 2 (two) times daily with a meal.    . CLONIDINE HCL 0.2 MG PO TABS Oral Take 0.2 mg by mouth 2 (two) times daily.      . COLCHICINE 0.6 MG PO TABS Oral Take 1  tablet (0.6 mg total) by mouth 2 (two) times daily. 30 tablet 0  . LISINOPRIL-HYDROCHLOROTHIAZIDE 20-25 MG PO TABS Oral Take 1 tablet by mouth daily.      [name] Kitchen ROSUVASTATIN CALCIUM 5 MG PO TABS Oral Take 5 mg by mouth daily.       BP 218/136  Pulse 101  Temp 97.8 F (36.6 C) (Oral)  Resp 18  Ht 5\' 10"  (1.778 m)  Wt 144 lb (65.318 kg)  BMI 20.66 kg/m2  SpO2 100%  Physical Exam  Nursing note and vitals reviewed.  77 year old male who is resting comfortably and in no acute distress. Vital signs are significant for tachycardia with heart rate of 101, and hypertension with blood pressure 218/136. Head is normocephalic and atraumatic. PERRLA, EOMI. Arcus senilis is present her Cecille Rubin is clear. Neck is nontender and supple. Abdomen is soft, flat, nontender. Small incisional hernia is present without any incarceration or tenderness. There no masses or hepatosplenomegaly. Bowel sounds are decreased. Extremities have no cyanosis or edema, full range of motion is present. Skin is warm and dry without rash. Neurologic: Mental status is normal, cranial nerves are intact, there are no motor or sensory deficits.  ED Course  Procedures (including critical care time)  Results for orders placed during the hospital encounter of 02/24/12  CBC WITH DIFFERENTIAL      Component Value Range   WBC 6.0  4.0 - 10.5 K/uL   RBC 5.71  4.22 - 5.81 MIL/uL   Hemoglobin 16.0  13.0 - 17.0 g/dL   HCT 46.6  39.0 - 52.0 %   MCV 81.6  78.0 - 100.0 fL   MCH 28.0  26.0 - 34.0 pg   MCHC 34.3  30.0 - 36.0 g/dL   RDW 17.8 (*) 11.5 - 15.5 %   Platelets 167  150 - 400 K/uL   Neutrophils Relative 91 (*) 43 - 77 %   Neutro Abs 5.4  1.7 - 7.7 K/uL   Lymphocytes Relative 7 (*) 12 - 46 %   Lymphs Abs 0.4 (*) 0.7 - 4.0 K/uL   Monocytes Relative 2 (*) 3 - 12 %   Monocytes Absolute 0.1  0.1 - 1.0 K/uL   Eosinophils Relative 0  0 - 5 %   Eosinophils Absolute 0.0  0.0 - 0.7 K/uL   Basophils Relative 0  0 - 1 %   Basophils Absolute  0.0  0.0 - 0.1 K/uL  COMPREHENSIVE METABOLIC PANEL      Component Value Range   Sodium 141  135 - 145 mEq/L   Potassium 3.9  3.5 - 5.1 mEq/L   Chloride 99  96 - 112 mEq/L   CO2 20  19 - 32 mEq/L   Glucose, Bld 194 (*) 70 - 99 mg/dL   BUN 24 (*) 6 - 23 mg/dL   Creatinine, Ser 1.47 (*) 0.50 - 1.35 mg/dL   Calcium 9.4  8.4 - 10.5 mg/dL   Total Protein 8.1  6.0 - 8.3 g/dL   Albumin 4.1  3.5 - 5.2 g/dL   AST 28  0 - 37 U/L   ALT 16  0 - 53 U/L   Alkaline Phosphatase 89  39 - 117 U/L   Total Bilirubin 0.5  0.3 - 1.2 mg/dL   GFR calc non Af Amer 45 (*) >90 mL/min   GFR calc Af Amer 52 (*) >90 mL/min  LIPASE, BLOOD      Component Value Range   Lipase 34  11 - 59 U/L   Dg Abd Acute W/chest  02/24/2012  *RADIOLOGY REPORT*  Clinical Data: Nausea, abdominal pain.  ACUTE ABDOMEN SERIES (ABDOMEN 2 VIEW & CHEST 1 VIEW)  Comparison: None.  Findings: Nonobstructive bowel gas pattern.  No free air organomegaly.  Calcified phleboliths in the pelvis.  Heart and mediastinal contours are within normal limits.  No focal opacities or effusions.  No acute bony abnormality.  Degenerative changes in the thoracolumbar spine.  IMPRESSION: No evidence of obstruction or free air.  No active cardiopulmonary disease.  Original Report Authenticated By: Raelyn Number, M.D.      Date: 02/24/2012  Rate: 103  Rhythm: sinus tachycardia  QRS Axis: normal  Intervals: normal  ST/T Wave abnormalities: normal  Conduction Disutrbances:none  Narrative Interpretation: Left atrial hypertrophy. When compared with ECG of 10/27/2011, T wave inversions in the lateral leads have resolved.  Old EKG Reviewed: changes noted    1. Nausea and vomiting   2. Hypertension   3. Recurrent ventral hernia       MDM  Nausea and vomiting without abdominal pain. I suspect this is a viral enteritis. His hernia is palpable but not incarcerated. Abdominal x-rays will be obtained to rule  out small bowel obstruction and he will be given IV  fluids and IV ondansetron.  He feels much better after IV fluids and IV ondansetron.blood pressure has come down but it is still elevated. Consultation was obtained with [facility] [place] [facility] since he was scheduled for hernia repair tomorrow. I wished to discuss whether he should be admitted under observation status to continue IV hydration until he has his surgery. He was seen by Dr. Donne Hazel who is discussed the case with Dr. Hulen Skains and decision was made to delay his surgery for 2 weeks because of the vomiting. Therefore, there is no reason to admit him observation status since he was no longer vomiting. He is sent home with prescription for metoclopramide.      Delora Fuel, MD [id number]

## 2012-02-24 NOTE — Consult Note (Signed)
Reason for [place] Referring Physician: Dr Trevor Murphy is an 77 y.o. male.  HPI: 48 yom with poorly controlled hypertension, etoh who presents with nausea and some vomiting since yesterday.  He is passing flatus and having bms.  He does not complain of any abdominal pain and his hernia is unchanged.  He feels better now in er after zofran and we are asked to see him as he has scheduled surgery tomorrow with Dr. Hulen Skains for lvh.  He had prior umbo hernia repair with mesh open that has recurred.  Past Medical History  Diagnosis Date  . Poor circulation   . Hernia     umbilical  . Gout   . Glaucoma   . Hypertension     does not see a cardiologist  . Arthritis     Past Surgical History  Procedure Date  . Hernia repair     No family history on file.  Social History:  reports that he has never smoked. He does not have any smokeless tobacco history on file. He reports that he drinks about .6 ounces of alcohol per week. He reports that he does not use illicit drugs.  Allergies: No Known Allergies  Medications: I have reviewed the patient's current medications.  Results for orders placed during the hospital encounter of 02/24/12 (from the past 48 hour(s))  CBC WITH DIFFERENTIAL     Status: Abnormal   Collection Time   02/24/12 12:20 PM      Component Value Range Comment   WBC 6.0  4.0 - 10.5 K/uL    RBC 5.71  4.22 - 5.81 MIL/uL    Hemoglobin 16.0  13.0 - 17.0 g/dL    HCT 46.6  39.0 - 52.0 %    MCV 81.6  78.0 - 100.0 fL    MCH 28.0  26.0 - 34.0 pg    MCHC 34.3  30.0 - 36.0 g/dL    RDW 17.8 (*) 11.5 - 15.5 %    Platelets 167  150 - 400 K/uL    Neutrophils Relative 91 (*) 43 - 77 %    Neutro Abs 5.4  1.7 - 7.7 K/uL    Lymphocytes Relative 7 (*) 12 - 46 %    Lymphs Abs 0.4 (*) 0.7 - 4.0 K/uL    Monocytes Relative 2 (*) 3 - 12 %    Monocytes Absolute 0.1  0.1 - 1.0 K/uL    Eosinophils Relative 0  0 - 5 %    Eosinophils Absolute 0.0  0.0 - 0.7 K/uL    Basophils  Relative 0  0 - 1 %    Basophils Absolute 0.0  0.0 - 0.1 K/uL   COMPREHENSIVE METABOLIC PANEL     Status: Abnormal   Collection Time   02/24/12 12:20 PM      Component Value Range Comment   Sodium 141  135 - 145 mEq/L    Potassium 3.9  3.5 - 5.1 mEq/L    Chloride 99  96 - 112 mEq/L    CO2 20  19 - 32 mEq/L    Glucose, Bld 194 (*) 70 - 99 mg/dL    BUN 24 (*) 6 - 23 mg/dL    Creatinine, Ser 1.47 (*) 0.50 - 1.35 mg/dL    Calcium 9.4  8.4 - 10.5 mg/dL    Total Protein 8.1  6.0 - 8.3 g/dL    Albumin 4.1  3.5 - 5.2 g/dL    AST 28  0 -  37 U/L    ALT 16  0 - 53 U/L    Alkaline Phosphatase 89  39 - 117 U/L    Total Bilirubin 0.5  0.3 - 1.2 mg/dL    GFR calc non Af Amer 45 (*) >90 mL/min    GFR calc Af Amer 52 (*) >90 mL/min   LIPASE, BLOOD     Status: Normal   Collection Time   02/24/12 12:20 PM      Component Value Range Comment   Lipase 34  11 - 59 U/L     Dg Abd Acute W/chest  02/24/2012  *RADIOLOGY REPORT*  Clinical Data: Nausea, abdominal pain.  ACUTE ABDOMEN SERIES (ABDOMEN 2 VIEW & CHEST 1 VIEW)  Comparison: None.  Findings: Nonobstructive bowel gas pattern.  No free air organomegaly.  Calcified phleboliths in the pelvis.  Heart and mediastinal contours are within normal limits.  No focal opacities or effusions.  No acute bony abnormality.  Degenerative changes in the thoracolumbar spine.  IMPRESSION: No evidence of obstruction or free air.  No active cardiopulmonary disease.  Original Report Authenticated By: Raelyn Number, M.D.    ROS Blood pressure 172/91, pulse 101, temperature 97.8 F (36.6 C), temperature source Oral, resp. rate 20, height 5\' 10"  (1.778 m), weight 144 lb (65.318 kg), SpO2 98.00%. Physical Exam  Vitals reviewed. Constitutional: No distress.  GI: Soft. Bowel sounds are normal. There is no tenderness.      Assessment/Plan: Recurrent hernia Nausea/vomiting  I don't think symptoms are related to hernia. His exam is benign and this hernia is nontender and  reducible.  His films are normal.  I discussed with Dr. Hulen Skains his surgeon also.  Will put off surgery due to illness until later this month. Additionally he also needs bp better controlled.   Trevor Murphy 02/24/2012, 2:46 PM

## 2012-02-24 NOTE — Progress Notes (Addendum)
Orders were put in EPIC after PAT appointment. The status went to "cancelled" due to patient being discharged from PAT. Spoke with Alisha at [facility] verifying orders.

## 2012-02-24 NOTE — ED Notes (Signed)
Patient reports onset of stomach pain and n/v last night.  He reports normal bm last night.  Patient denies chest pain.  He has noted hernia.  He is to have surgery on hernia tomorrow

## 2012-02-25 ENCOUNTER — Ambulatory Visit (HOSPITAL_COMMUNITY): Admission: RE | Admit: 2012-02-25 | Payer: Medicare Other | Source: Ambulatory Visit | Admitting: General Surgery

## 2012-02-25 ENCOUNTER — Encounter (HOSPITAL_COMMUNITY): Admission: RE | Payer: Self-pay | Source: Ambulatory Visit

## 2012-02-25 ENCOUNTER — Telehealth (INDEPENDENT_AMBULATORY_CARE_PROVIDER_SITE_OTHER): Payer: Self-pay

## 2012-02-25 ENCOUNTER — Encounter (HOSPITAL_COMMUNITY): Payer: Self-pay | Admitting: Pharmacy Technician

## 2012-02-25 SURGERY — REPAIR, HERNIA, VENTRAL, LAPAROSCOPIC
Anesthesia: General

## 2012-02-25 NOTE — Telephone Encounter (Signed)
Message copied by Carlene Coria on Thu Feb 25, 2012  8:33 AM ------      Message from: Doreen Salvage      Created: Thu Feb 25, 2012  8:06 AM       Please have Mr. Hartkopf seen by his PCP because of hypertension.   We had to reschedule his surgery to the 29th.      ----- Message -----         From: Rolm Bookbinder, MD         Sent: 02/24/2012   4:40 PM           To: Doreen Salvage, MD            Ulice Dash      I forgot to mention his bp was really high.  Apparently it was high at his preop appt with anesthesia according to notes.  It might be good to get him to see pcp prior to doing this so he doesn't get cancelled day of surgery.      Matt

## 2012-02-25 NOTE — Telephone Encounter (Signed)
Spoke with patient and he agreed to call PCP for appointment to  Monitor HBP

## 2012-03-04 ENCOUNTER — Encounter (HOSPITAL_COMMUNITY): Payer: Self-pay | Admitting: *Deleted

## 2012-03-04 NOTE — Progress Notes (Signed)
I called Dr Richarda Blade office and ask for orders for surgery.

## 2012-03-07 ENCOUNTER — Encounter (HOSPITAL_COMMUNITY): Payer: Self-pay | Admitting: Certified Registered Nurse Anesthetist

## 2012-03-07 ENCOUNTER — Ambulatory Visit (HOSPITAL_COMMUNITY): Payer: Medicare Other

## 2012-03-07 ENCOUNTER — Ambulatory Visit (HOSPITAL_COMMUNITY)
Admission: RE | Admit: 2012-03-07 | Discharge: 2012-03-07 | Disposition: A | Discharge status: Home / Self Care | Payer: Medicare Other | Source: Ambulatory Visit | Attending: General Surgery | Admitting: General Surgery

## 2012-03-07 ENCOUNTER — Encounter (HOSPITAL_COMMUNITY): Payer: Self-pay | Admitting: Surgery

## 2012-03-07 ENCOUNTER — Encounter (HOSPITAL_COMMUNITY)
Admission: RE | Disposition: A | Discharge status: Home / Self Care | Payer: Self-pay | Source: Ambulatory Visit | Attending: General Surgery

## 2012-03-07 ENCOUNTER — Ambulatory Visit (HOSPITAL_COMMUNITY): Payer: Medicare Other | Admitting: Certified Registered Nurse Anesthetist

## 2012-03-07 DIAGNOSIS — K432 Incisional hernia without obstruction or gangrene: Secondary | ICD-10-CM

## 2012-03-07 DIAGNOSIS — I10 Essential (primary) hypertension: Secondary | ICD-10-CM | POA: Insufficient documentation

## 2012-03-07 HISTORY — PX: VENTRAL HERNIA REPAIR: SHX424

## 2012-03-07 LAB — BASIC METABOLIC PANEL
BUN: 19 mg/dL (ref 6–23)
CO2: 25 mEq/L (ref 19–32)
Calcium: 9.3 mg/dL (ref 8.4–10.5)
Chloride: 100 mEq/L (ref 96–112)
Creatinine, Ser: 1.65 mg/dL — ABNORMAL HIGH (ref 0.50–1.35)
GFR calc Af Amer: 46 mL/min — ABNORMAL LOW (ref >90)
GFR calc non Af Amer: 39 mL/min — ABNORMAL LOW (ref >90)
Glucose, Bld: 122 mg/dL — ABNORMAL HIGH (ref 70–99)
Potassium: 4.8 mEq/L (ref 3.5–5.1)
Sodium: 136 mEq/L (ref 135–145)

## 2012-03-07 LAB — CBC
HCT: 46 % (ref 39.0–52.0)
Hemoglobin: 15.6 g/dL (ref 13.0–17.0)
MCH: 28.1 pg (ref 26.0–34.0)
MCHC: 33.9 g/dL (ref 30.0–36.0)
MCV: 82.7 fL (ref 78.0–100.0)
Platelets: 206 10*3/uL (ref 150–400)
RBC: 5.56 MIL/uL (ref 4.22–5.81)
RDW: 16.4 % — ABNORMAL HIGH (ref 11.5–15.5)
WBC: 4.7 10*3/uL (ref 4.0–10.5)

## 2012-03-07 LAB — SURGICAL PCR SCREEN
MRSA, PCR: NEGATIVE
Staphylococcus aureus: NEGATIVE

## 2012-03-07 SURGERY — REPAIR, HERNIA, VENTRAL, LAPAROSCOPIC
Anesthesia: General | Site: Abdomen | Wound class: Clean

## 2012-03-07 MED ORDER — HYDROCODONE-ACETAMINOPHEN 5-325 MG PO TABS
ORAL_TABLET | ORAL | Status: AC
  Filled 2012-03-07: qty 1

## 2012-03-07 MED ORDER — CARVEDILOL 12.5 MG PO TABS
12.5000 mg | ORAL_TABLET | ORAL | Status: AC
  Administered 2012-03-07: 12.5 mg via ORAL
  Filled 2012-03-07: qty 1

## 2012-03-07 MED ORDER — CLONIDINE HCL 0.2 MG PO TABS
0.2000 mg | ORAL_TABLET | ORAL | Status: AC
  Administered 2012-03-07: 0.2 mg via ORAL
  Filled 2012-03-07: qty 1

## 2012-03-07 MED ORDER — SODIUM CHLORIDE 0.9 % IR SOLN
Status: DC | PRN
  Administered 2012-03-07: 1000 mL

## 2012-03-07 MED ORDER — ROCURONIUM BROMIDE 100 MG/10ML IV SOLN
INTRAVENOUS | Status: DC | PRN
  Administered 2012-03-07: 35 mg via INTRAVENOUS
  Administered 2012-03-07: 5 mg via INTRAVENOUS

## 2012-03-07 MED ORDER — PROPOFOL 10 MG/ML IV EMUL
INTRAVENOUS | Status: DC | PRN
  Administered 2012-03-07: 40 mg via INTRAVENOUS
  Administered 2012-03-07: 50 mg via INTRAVENOUS
  Administered 2012-03-07: 160 mg via INTRAVENOUS

## 2012-03-07 MED ORDER — LIDOCAINE HCL 4 % MT SOLN
OROMUCOSAL | Status: DC | PRN
  Administered 2012-03-07: 4 mL via TOPICAL

## 2012-03-07 MED ORDER — CEFAZOLIN SODIUM 1-5 GM-% IV SOLN
INTRAVENOUS | Status: DC | PRN
  Administered 2012-03-07: 2 g via INTRAVENOUS

## 2012-03-07 MED ORDER — CEFAZOLIN SODIUM-DEXTROSE 2-3 GM-% IV SOLR
INTRAVENOUS | Status: AC
  Filled 2012-03-07: qty 50

## 2012-03-07 MED ORDER — LACTATED RINGERS IV SOLN
INTRAVENOUS | Status: DC
  Administered 2012-03-07: 08:00:00 via INTRAVENOUS

## 2012-03-07 MED ORDER — HYDROCODONE-ACETAMINOPHEN 5-500 MG PO TABS: 1.0000 | ORAL_TABLET | Freq: Four times a day (QID) | ORAL | Status: AC | PRN

## 2012-03-07 MED ORDER — LABETALOL HCL 5 MG/ML IV SOLN
INTRAVENOUS | Status: DC | PRN
  Administered 2012-03-07 (×2): 5 mg via INTRAVENOUS
  Administered 2012-03-07: 10 mg via INTRAVENOUS
  Administered 2012-03-07 (×2): 5 mg via INTRAVENOUS

## 2012-03-07 MED ORDER — HYDROMORPHONE HCL PF 1 MG/ML IJ SOLN
INTRAMUSCULAR | Status: AC
  Filled 2012-03-07: qty 1

## 2012-03-07 MED ORDER — EPHEDRINE SULFATE 50 MG/ML IJ SOLN
INTRAMUSCULAR | Status: DC | PRN
  Administered 2012-03-07: 5 mg via INTRAVENOUS
  Administered 2012-03-07: 10 mg via INTRAVENOUS

## 2012-03-07 MED ORDER — FENTANYL CITRATE 0.05 MG/ML IJ SOLN
INTRAMUSCULAR | Status: DC | PRN
  Administered 2012-03-07 (×2): 50 ug via INTRAVENOUS
  Administered 2012-03-07: 100 ug via INTRAVENOUS

## 2012-03-07 MED ORDER — HYDROCODONE-ACETAMINOPHEN 5-325 MG PO TABS
1.0000 | ORAL_TABLET | Freq: Once | ORAL | Status: AC
  Administered 2012-03-07: 1 via ORAL

## 2012-03-07 MED ORDER — ONDANSETRON HCL 4 MG/2ML IJ SOLN
INTRAMUSCULAR | Status: DC | PRN
  Administered 2012-03-07: 4 mg via INTRAVENOUS

## 2012-03-07 MED ORDER — ONDANSETRON HCL 4 MG/2ML IJ SOLN: 4.0000 mg | Freq: Once | INTRAMUSCULAR | Status: DC | PRN

## 2012-03-07 MED ORDER — LIDOCAINE HCL (CARDIAC) 20 MG/ML IV SOLN
INTRAVENOUS | Status: DC | PRN
  Administered 2012-03-07: 100 mg via INTRAVENOUS

## 2012-03-07 MED ORDER — MUPIROCIN 2 % EX OINT
TOPICAL_OINTMENT | CUTANEOUS | Status: AC
  Administered 2012-03-07: 1 via NASAL
  Filled 2012-03-07: qty 22

## 2012-03-07 MED ORDER — GLYCOPYRROLATE 0.2 MG/ML IJ SOLN
INTRAMUSCULAR | Status: DC | PRN
  Administered 2012-03-07: 0.2 mg via INTRAVENOUS
  Administered 2012-03-07: .5 mg via INTRAVENOUS
  Administered 2012-03-07: 0.2 mg via INTRAVENOUS

## 2012-03-07 MED ORDER — BUPIVACAINE-EPINEPHRINE PF 0.25-1:200000 % IJ SOLN
INTRAMUSCULAR | Status: AC
  Filled 2012-03-07: qty 30

## 2012-03-07 MED ORDER — DEXTROSE 5 % IV SOLN
INTRAVENOUS | Status: AC
  Filled 2012-03-07: qty 1

## 2012-03-07 MED ORDER — LACTATED RINGERS IV SOLN
INTRAVENOUS | Status: DC | PRN
  Administered 2012-03-07 (×2): via INTRAVENOUS

## 2012-03-07 MED ORDER — 0.9 % SODIUM CHLORIDE (POUR BTL) OPTIME
TOPICAL | Status: DC | PRN
  Administered 2012-03-07: 1000 mL

## 2012-03-07 MED ORDER — BUPIVACAINE-EPINEPHRINE 0.25% -1:200000 IJ SOLN
INTRAMUSCULAR | Status: DC | PRN
  Administered 2012-03-07: 20 mL

## 2012-03-07 MED ORDER — HYDROMORPHONE HCL PF 1 MG/ML IJ SOLN
0.2500 mg | INTRAMUSCULAR | Status: DC | PRN
  Administered 2012-03-07 (×3): 0.5 mg via INTRAVENOUS

## 2012-03-07 MED ORDER — SODIUM CHLORIDE 0.9 % IR SOLN
Status: DC | PRN
  Administered 2012-03-07: 10:00:00

## 2012-03-07 MED ORDER — NEOSTIGMINE METHYLSULFATE 1 MG/ML IJ SOLN
INTRAMUSCULAR | Status: DC | PRN
  Administered 2012-03-07: 3 mg via INTRAVENOUS

## 2012-03-07 MED ORDER — MUPIROCIN 2 % EX OINT
TOPICAL_OINTMENT | Freq: Two times a day (BID) | CUTANEOUS | Status: DC
  Administered 2012-03-07: 1 via NASAL

## 2012-03-07 SURGICAL SUPPLY — 52 items
APPLIER CLIP LOGIC TI 5 (MISCELLANEOUS) IMPLANT
APPLIER CLIP ROT 10 11.4 M/L (STAPLE)
BAG DECANTER FOR FLEXI CONT (MISCELLANEOUS) ×2 IMPLANT
BENZOIN TINCTURE PRP APPL 2/3 (GAUZE/BANDAGES/DRESSINGS) ×2 IMPLANT
BINDER ABD UNIV 12 45-62 (WOUND CARE) ×1 IMPLANT
BINDER ABDOMINAL 46IN 62IN (WOUND CARE) ×2
BLADE SURG ROTATE 9660 (MISCELLANEOUS) IMPLANT
CANISTER SUCTION 2500CC (MISCELLANEOUS) IMPLANT
CHLORAPREP W/TINT 26ML (MISCELLANEOUS) ×2 IMPLANT
CLIP APPLIE ROT 10 11.4 M/L (STAPLE) IMPLANT
CLOTH BEACON ORANGE TIMEOUT ST (SAFETY) ×2 IMPLANT
DECANTER SPIKE VIAL GLASS SM (MISCELLANEOUS) IMPLANT
DERMABOND ADVANCED (GAUZE/BANDAGES/DRESSINGS) ×3
DERMABOND ADVANCED .7 DNX12 (GAUZE/BANDAGES/DRESSINGS) ×3 IMPLANT
DEVICE SECURE STRAP 25 ABSORB (INSTRUMENTS) ×2 IMPLANT
DEVICE TROCAR PUNCTURE CLOSURE (ENDOMECHANICALS) ×2 IMPLANT
DISSECTOR BLUNT TIP ENDO 5MM (MISCELLANEOUS) IMPLANT
DRAPE UTILITY 15X26 W/TAPE STR (DRAPE) ×4 IMPLANT
DRSG TEGADERM 4X4.75 (GAUZE/BANDAGES/DRESSINGS) ×4 IMPLANT
ELECT REM PT RETURN 9FT ADLT (ELECTROSURGICAL) ×2
ELECTRODE REM PT RTRN 9FT ADLT (ELECTROSURGICAL) ×1 IMPLANT
GLOVE BIOGEL PI IND STRL 6 (GLOVE) ×1 IMPLANT
GLOVE BIOGEL PI IND STRL 7.0 (GLOVE) ×1 IMPLANT
GLOVE BIOGEL PI IND STRL 8 (GLOVE) ×1 IMPLANT
GLOVE BIOGEL PI INDICATOR 6 (GLOVE) ×1
GLOVE BIOGEL PI INDICATOR 7.0 (GLOVE) ×1
GLOVE BIOGEL PI INDICATOR 8 (GLOVE) ×1
GLOVE ECLIPSE 6.0 STRL STRAW (GLOVE) ×2 IMPLANT
GLOVE ECLIPSE 7.5 STRL STRAW (GLOVE) ×2 IMPLANT
GLOVE SURG SS PI 7.0 STRL IVOR (GLOVE) ×2 IMPLANT
GOWN STRL NON-REIN LRG LVL3 (GOWN DISPOSABLE) ×6 IMPLANT
KIT BASIN OR (CUSTOM PROCEDURE TRAY) ×2 IMPLANT
KIT ROOM TURNOVER OR (KITS) ×2 IMPLANT
MESH PHYSIO OVAL 15X20CM (Mesh General) ×2 IMPLANT
NEEDLE SPNL 22GX3.5 QUINCKE BK (NEEDLE) ×2 IMPLANT
NS IRRIG 1000ML POUR BTL (IV SOLUTION) ×2 IMPLANT
PAD ARMBOARD 7.5X6 YLW CONV (MISCELLANEOUS) ×4 IMPLANT
PEN SKIN MARKING BROAD (MISCELLANEOUS) ×2 IMPLANT
SET IRRIG TUBING LAPAROSCOPIC (IRRIGATION / IRRIGATOR) ×2 IMPLANT
STAPLER VISISTAT 35W (STAPLE) ×2 IMPLANT
STRIP CLOSURE SKIN 1/2X4 (GAUZE/BANDAGES/DRESSINGS) ×2 IMPLANT
SUT MNCRL AB 4-0 PS2 18 (SUTURE) ×2 IMPLANT
SUT NOVA 0 T19/GS 22DT (SUTURE) ×4 IMPLANT
SUT VICRYL 0 TIES 12 18 (SUTURE) IMPLANT
SUT VICRYL 0 UR6 27IN ABS (SUTURE) IMPLANT
TOWEL OR 17X24 6PK STRL BLUE (TOWEL DISPOSABLE) ×2 IMPLANT
TOWEL OR 17X26 10 PK STRL BLUE (TOWEL DISPOSABLE) ×2 IMPLANT
TRAY FOLEY CATH 14FRSI W/METER (CATHETERS) ×2 IMPLANT
TRAY LAPAROSCOPIC (CUSTOM PROCEDURE TRAY) ×2 IMPLANT
TROCAR XCEL NON-BLD 11X100MML (ENDOMECHANICALS) ×4 IMPLANT
TROCAR XCEL NON-BLD 5MMX100MML (ENDOMECHANICALS) ×4 IMPLANT
WATER STERILE IRR 1000ML POUR (IV SOLUTION) ×2 IMPLANT

## 2012-03-07 NOTE — Interval H&P Note (Signed)
History and Physical Interval Note:  03/07/2012 7:17 AM  Trevor Murphy  has presented today for surgery, with the diagnosis of Recurrent ventral hernia  The various methods of treatment have been discussed with the patient and family. After consideration of risks, benefits and other options for treatment, the patient has consented to  Procedure(s) (LRB): LAPAROSCOPIC VENTRAL HERNIA (N/A) INSERTION OF MESH (N/A) as a surgical intervention .  The patient's history has been reviewed, patient examined, no change in status, stable for surgery.  I have reviewed the patient's chart and labs.  Questions were answered to the patient's satisfaction.     Judeth Horn O  The patient came into the ED with nausea and vomiting, likely a GI bug.  Also was very hypertensive in the ED.  His chronic debilitated state because of ETOH abuse makes him a likely recurrent patient.  Also, he has a diastasis recti which thins his midline fascia.  Kathryne Eriksson. Dahlia Bailiff, MD, [place] [phone] [facility]

## 2012-03-07 NOTE — Anesthesia Preprocedure Evaluation (Signed)
Anesthesia Evaluation  Patient identified by MRN, date of birth, ID band Patient awake    Reviewed: Allergy & Precautions, H&P , NPO status   Airway Mallampati: I TM Distance: >3 FB Neck ROM: full    Dental  (+) Missing and Poor Dentition   Pulmonary          Cardiovascular hypertension, Rhythm:regular Rate:Normal     Neuro/Psych    GI/Hepatic   Endo/Other    Renal/GU      Musculoskeletal   Abdominal   Peds  Hematology   Anesthesia Other Findings   Reproductive/Obstetrics                           Anesthesia Physical Anesthesia Plan  ASA: III  Anesthesia Plan: General   Post-op Pain Management:    Induction: Intravenous  Airway Management Planned: Oral ETT  Additional Equipment:   Intra-op Plan:   Post-operative Plan:   Informed Consent: I have reviewed the patients History and Physical, chart, labs and discussed the procedure including the risks, benefits and alternatives for the proposed anesthesia with the patient or authorized representative who has indicated his/her understanding and acceptance.     Plan Discussed with: CRNA, Anesthesiologist and Surgeon  Anesthesia Plan Comments:         Anesthesia Quick Evaluation

## 2012-03-07 NOTE — Progress Notes (Signed)
Dr. Hulen Skains updated about patient's SBP still in the 200's even after dose of clonidine0.2mg  and coreg 12.5mg , he said okay for patient to go home. SBP in the 200's is same as pre-op.

## 2012-03-07 NOTE — Anesthesia Procedure Notes (Signed)
Procedure Name: Intubation Date/Time: 03/07/2012 9:31 AM Performed by: Maryland Pink Pre-anesthesia Checklist: Patient identified, Timeout performed, Emergency Drugs available, Suction available and Patient being monitored Patient Re-evaluated:Patient Re-evaluated prior to inductionOxygen Delivery Method: Circle system utilized Preoxygenation: Pre-oxygenation with 100% oxygen Intubation Type: IV induction Ventilation: Mask ventilation without difficulty Laryngoscope Size: Mac and 3 Grade View: Grade I Tube type: Oral Tube size: 7.5 mm Number of attempts: 1 Airway Equipment and Method: Stylet and LTA kit utilized Placement Confirmation: ETT inserted through vocal cords under direct vision,  positive ETCO2 and breath sounds checked- equal and bilateral Secured at: 21 cm Tube secured with: Tape Dental Injury: Teeth and Oropharynx as per pre-operative assessment  Comments: Performed by Williemae Area, CRNA

## 2012-03-07 NOTE — Anesthesia Postprocedure Evaluation (Signed)
  Anesthesia Post-op Note  Patient: Trevor Murphy  Procedure(s) Performed: Procedure(s) (LRB): LAPAROSCOPIC VENTRAL HERNIA (N/A) INSERTION OF MESH (N/A)  Patient Location: PACU  Anesthesia Type: General  Level of Consciousness: awake, alert , oriented and patient cooperative  Airway and Oxygen Therapy: Patient Spontanous Breathing and Patient connected to nasal cannula oxygen  Post-op Pain: mild  Post-op Assessment: Post-op Vital signs reviewed, Patient's Cardiovascular Status Stable, Respiratory Function Stable, Patent Airway, No signs of Nausea or vomiting and Pain level controlled  Post-op Vital Signs: stable  Complications: No apparent anesthesia complications

## 2012-03-07 NOTE — Progress Notes (Signed)
Dr. Tamala Julian called and notified of patients blood pressure being 209/92 and heart rate being 56. Nurse informed Anesthesiologist that patient normally takes Coreg 12.5 mg and Catapres 0.2 mg but did not take them this morning. No orders received.

## 2012-03-07 NOTE — Op Note (Signed)
OPERATIVE REPORT  DATE OF OPERATION: 03/07/2012  PATIENT:  Trevor Murphy  77 y.o. male  PRE-OPERATIVE DIAGNOSIS:  Recurrent ventral hernia  POST-OPERATIVE DIAGNOSIS:  Recurrent ventral hernia  PROCEDURE:  Procedure(s): LAPAROSCOPIC VENTRAL HERNIA INSERTION OF MESH  SURGEON:  Surgeon(s): Gwenyth Ober, MD  ASSISTANT: None  ANESTHESIA:   general  EBL: <50 ml  BLOOD ADMINISTERED: none  DRAINS: none   SPECIMEN:  No Specimen  COUNTS CORRECT:  YES  PROCEDURE DETAILS: To the operating room and placed on the table in the supine position. After an adequate general endotracheal anesthetic was administered she was prepped and draped in usual sterile manner exposing his entire abdomen.  The patient had a recurrent supraumbilical ventral hernia. Mesh had been placed at the time of that operation. We started the procedure with an Optiview 11 mm cannula been placed in the left upper quadrant. This went in easily. We then insufflated carbon dioxide gas up to a maximal intra-abdominal pressure of 15 mm of mercury. Subsequently a left lower quadrant 5 mm cannula was passed under direct vision into the peritoneal cavity. A right upper quadrant 11 mm cannula was passed under direct vision also. With all cannulas in place the patient was placed in Trendelenburg position and we began the dissection.  The four centimeter ventral hernia defect was attached to some omental adhesions which were taken down by blunt dissection and also with electrocautery attached to a Maryland forceps. We then assessed the size of the hernia defect using a long spinal needle. The edges of the fascial defect were marked using the spinal needle as a guide. After completely marking the edges we added an extra 5 cm from the edge in all 4 directions. This made the dimensions of the mesh needed approximately 14 x 19 cm in size.  A piece of PhysioMesh measuring 15 x 20 cm in size was used for the procedure. The longest dimension  was the width of the defect. After soaking the mesh in antibiotic solution 8 equally spaced peripheral sutures of 0 Novafil were attached to the mesh at the edge. We then passed the mesh into the peritoneal cavity through the right upper quadrant 11 mm cannula. We oriented the mesh intra-abdominally so that the longest dimension was transverse.  We attach the mesh to the anterior abdominal wall using the sutures that were placed in the edges of the mesh. We started with the one on the right lateral aspect and then circumferentially grabbed each suture and brought to the intra-abdominal wall using a suture retriever. This attach the mesh to the anterior abdominal wall and we subsequently tied them down on the anterior abdominal wall and cut the suture.  A Secure Strap mesh tacker was used to circumferentially complete the attachment of the PhysioMesh to the anterior abdominal wall. Approximately one and a half staplers were used to secure the mesh. We subsequently irrigated the area with saline solution using an laparoscopic irrigator. Then we aspirated all fluid and gas from around the liver and intra-abdominally and removed all cannulas.  He site was injected with quarter percent Marcaine with epinephrine. The 11 mm skin sites were closed using a running subcuticular stitch of 4-0 Monocryl. All the other skin sites were closed using Dermabond Steri-Strips Tegaderm were used to complete all dressings and abdominal binder was placed on top.  All needle counts sponge counts and instrument counts were correct  PATIENT DISPOSITION:  PACU - hemodynamically stable.   Gwenyth Ober 7/29/201311:51 AM

## 2012-03-07 NOTE — Progress Notes (Signed)
Pt. Unable to void.  Dr. Hulen Skains notified.  Order for in & out cath given and to call Dr. Hulen Skains w/ volume obtained.  261ml of clear amber urine obtained.  Dr. Hulen Skains notified and stated for pt to call office in AM  if he is unable to void during night.  Pt and son instructed and verbalized understanding. Pt discharged to home.//L. Avika Carbine,RN

## 2012-03-07 NOTE — Preoperative (Signed)
Beta Blockers   Reason not to administer Beta Blockers:Not Applicable 

## 2012-03-07 NOTE — Transfer of Care (Signed)
Immediate Anesthesia Transfer of Care Note  Patient: Trevor Murphy  Procedure(s) Performed: Procedure(s) (LRB): LAPAROSCOPIC VENTRAL HERNIA (N/A) INSERTION OF MESH (N/A)  Patient Location: PACU  Anesthesia Type: General  Level of Consciousness: sedated  Airway & Oxygen Therapy: Patient Spontanous Breathing  Post-op Assessment: Report given to PACU RN  Post vital signs: stable  Complications: No apparent anesthesia complications

## 2012-03-07 NOTE — Progress Notes (Signed)
Dr. Hulen Skains paged about no orders for consent form. Orders given.

## 2012-03-07 NOTE — H&P (View-Only) (Signed)
HPI The patient comes in with a recurrent supraumbilical hernia.  PE On examination he has about a 3-4 cm defect with protruding fat and omentum.  Studiy review No studies are required at this time.  Assessment Recurrent supraumbilical ventral hernia. This is just above the site where he had his previous umbilical hernia repaired before. The patient also had a diastases recti and probably the cause of the weakness of his fascia in that area the hernia did recur.  Plan Laparoscopic ventral hernia repair. This will be done as an outpatient.

## 2012-03-07 NOTE — Progress Notes (Signed)
Dr. Tamala Julian notified of SBP back  in the 200's which is same as pre-op, no new order given.

## 2012-03-08 ENCOUNTER — Encounter (HOSPITAL_COMMUNITY): Payer: Self-pay | Admitting: General Surgery

## 2012-03-24 ENCOUNTER — Ambulatory Visit (INDEPENDENT_AMBULATORY_CARE_PROVIDER_SITE_OTHER): Payer: Medicare Other | Admitting: General Surgery

## 2012-03-24 ENCOUNTER — Encounter (INDEPENDENT_AMBULATORY_CARE_PROVIDER_SITE_OTHER): Payer: Self-pay | Admitting: General Surgery

## 2012-03-24 VITALS — BP 148/88 | HR 104 | Temp 97.6°F | Resp 25 | Ht 70.0 in | Wt 159.0 lb

## 2012-03-24 DIAGNOSIS — Z09 Encounter for follow-up examination after completed treatment for conditions other than malignant neoplasm: Secondary | ICD-10-CM

## 2012-03-24 NOTE — Progress Notes (Signed)
The patient is here S/P laparoscopic ventral hernia repair.  He is doing well.  No symptoms of problems.  Unfortunately his wife had a carotid endarterectomy today, but is doing well.  On examination:  No recurrence of hernia with straining or Valsalva. Wound have heal well.  He does hav a diastasis recti which  Has not changed  Doing well S/P Lap ventral hernia repair  No lifting > 25 pounds for the next month. RTC to see me on PRN basis.

## 2012-07-25 ENCOUNTER — Inpatient Hospital Stay (HOSPITAL_COMMUNITY)
Admission: EM | Admit: 2012-07-25 | Discharge: 2012-07-28 | DRG: 065 | Disposition: A | Discharge status: Home / Self Care | Payer: Medicare Other | Attending: Internal Medicine | Admitting: Internal Medicine

## 2012-07-25 ENCOUNTER — Emergency Department (HOSPITAL_COMMUNITY): Payer: Medicare Other

## 2012-07-25 ENCOUNTER — Encounter (HOSPITAL_COMMUNITY): Payer: Self-pay | Admitting: Emergency Medicine

## 2012-07-25 DIAGNOSIS — R112 Nausea with vomiting, unspecified: Secondary | ICD-10-CM | POA: Diagnosis present

## 2012-07-25 DIAGNOSIS — I10 Essential (primary) hypertension: Secondary | ICD-10-CM

## 2012-07-25 DIAGNOSIS — N1832 Chronic kidney disease, stage 3b: Secondary | ICD-10-CM | POA: Diagnosis present

## 2012-07-25 DIAGNOSIS — I130 Hypertensive heart and chronic kidney disease with heart failure and stage 1 through stage 4 chronic kidney disease, or unspecified chronic kidney disease: Secondary | ICD-10-CM | POA: Diagnosis present

## 2012-07-25 DIAGNOSIS — N179 Acute kidney failure, unspecified: Secondary | ICD-10-CM | POA: Diagnosis present

## 2012-07-25 DIAGNOSIS — Z23 Encounter for immunization: Secondary | ICD-10-CM

## 2012-07-25 DIAGNOSIS — K432 Incisional hernia without obstruction or gangrene: Secondary | ICD-10-CM

## 2012-07-25 DIAGNOSIS — I639 Cerebral infarction, unspecified: Secondary | ICD-10-CM | POA: Diagnosis present

## 2012-07-25 DIAGNOSIS — N183 Chronic kidney disease, stage 3 unspecified: Secondary | ICD-10-CM

## 2012-07-25 DIAGNOSIS — N189 Chronic kidney disease, unspecified: Secondary | ICD-10-CM

## 2012-07-25 DIAGNOSIS — I6350 Cerebral infarction due to unspecified occlusion or stenosis of unspecified cerebral artery: Principal | ICD-10-CM | POA: Diagnosis present

## 2012-07-25 DIAGNOSIS — I999 Unspecified disorder of circulatory system: Secondary | ICD-10-CM | POA: Diagnosis present

## 2012-07-25 DIAGNOSIS — Z7982 Long term (current) use of aspirin: Secondary | ICD-10-CM

## 2012-07-25 DIAGNOSIS — M129 Arthropathy, unspecified: Secondary | ICD-10-CM | POA: Diagnosis present

## 2012-07-25 DIAGNOSIS — R42 Dizziness and giddiness: Secondary | ICD-10-CM | POA: Diagnosis present

## 2012-07-25 DIAGNOSIS — H8110 Benign paroxysmal vertigo, unspecified ear: Secondary | ICD-10-CM | POA: Diagnosis present

## 2012-07-25 DIAGNOSIS — Z09 Encounter for follow-up examination after completed treatment for conditions other than malignant neoplasm: Secondary | ICD-10-CM

## 2012-07-25 DIAGNOSIS — R0989 Other specified symptoms and signs involving the circulatory and respiratory systems: Secondary | ICD-10-CM

## 2012-07-25 DIAGNOSIS — I129 Hypertensive chronic kidney disease with stage 1 through stage 4 chronic kidney disease, or unspecified chronic kidney disease: Secondary | ICD-10-CM | POA: Diagnosis present

## 2012-07-25 DIAGNOSIS — M109 Gout, unspecified: Secondary | ICD-10-CM | POA: Diagnosis present

## 2012-07-25 DIAGNOSIS — Z79899 Other long term (current) drug therapy: Secondary | ICD-10-CM

## 2012-07-25 HISTORY — DX: Headache: R51

## 2012-07-25 LAB — COMPREHENSIVE METABOLIC PANEL
ALT: 8 U/L (ref 0–53)
AST: 17 U/L (ref 0–37)
Albumin: 3.8 g/dL (ref 3.5–5.2)
Alkaline Phosphatase: 78 U/L (ref 39–117)
BUN: 27 mg/dL — ABNORMAL HIGH (ref 6–23)
CO2: 23 mEq/L (ref 19–32)
Calcium: 9.2 mg/dL (ref 8.4–10.5)
Chloride: 101 mEq/L (ref 96–112)
Creatinine, Ser: 1.62 mg/dL — ABNORMAL HIGH (ref 0.50–1.35)
GFR calc Af Amer: 46 mL/min — ABNORMAL LOW (ref >90)
GFR calc non Af Amer: 40 mL/min — ABNORMAL LOW (ref >90)
Glucose, Bld: 181 mg/dL — ABNORMAL HIGH (ref 70–99)
Potassium: 4.1 mEq/L (ref 3.5–5.1)
Sodium: 138 mEq/L (ref 135–145)
Total Bilirubin: 0.4 mg/dL (ref 0.3–1.2)
Total Protein: 7.5 g/dL (ref 6.0–8.3)

## 2012-07-25 LAB — CBC WITH DIFFERENTIAL/PLATELET
Basophils Absolute: 0 10*3/uL (ref 0.0–0.1)
Basophils Relative: 0 % (ref 0–1)
Eosinophils Absolute: 0 10*3/uL (ref 0.0–0.7)
Eosinophils Relative: 0 % (ref 0–5)
HCT: 42.5 % (ref 39.0–52.0)
Hemoglobin: 14 g/dL (ref 13.0–17.0)
Lymphocytes Relative: 10 % — ABNORMAL LOW (ref 12–46)
Lymphs Abs: 1 10*3/uL (ref 0.7–4.0)
MCH: 27.1 pg (ref 26.0–34.0)
MCHC: 32.9 g/dL (ref 30.0–36.0)
MCV: 82.2 fL (ref 78.0–100.0)
Monocytes Absolute: 0.3 10*3/uL (ref 0.1–1.0)
Monocytes Relative: 3 % (ref 3–12)
Neutro Abs: 8.3 10*3/uL — ABNORMAL HIGH (ref 1.7–7.7)
Neutrophils Relative %: 87 % — ABNORMAL HIGH (ref 43–77)
Platelets: 154 10*3/uL (ref 150–400)
RBC: 5.17 MIL/uL (ref 4.22–5.81)
RDW: 15.9 % — ABNORMAL HIGH (ref 11.5–15.5)
WBC: 9.6 10*3/uL (ref 4.0–10.5)

## 2012-07-25 LAB — URINALYSIS, MICROSCOPIC ONLY
Bilirubin Urine: NEGATIVE
Glucose, UA: NEGATIVE mg/dL
Hgb urine dipstick: NEGATIVE
Ketones, ur: NEGATIVE mg/dL
Leukocytes, UA: NEGATIVE
Nitrite: NEGATIVE
Protein, ur: 100 mg/dL — AB
Specific Gravity, Urine: 1.02 (ref 1.005–1.030)
Urobilinogen, UA: 0.2 mg/dL (ref 0.0–1.0)
pH: 7 (ref 5.0–8.0)

## 2012-07-25 MED ORDER — ONDANSETRON HCL 4 MG/2ML IJ SOLN
4.0000 mg | Freq: Once | INTRAMUSCULAR | Status: AC
  Administered 2012-07-26: 4 mg via INTRAVENOUS
  Filled 2012-07-25: qty 2

## 2012-07-25 MED ORDER — MECLIZINE HCL 25 MG PO TABS
25.0000 mg | ORAL_TABLET | Freq: Once | ORAL | Status: AC
  Administered 2012-07-25: 25 mg via ORAL
  Filled 2012-07-25: qty 1

## 2012-07-25 NOTE — ED Provider Notes (Signed)
History     CSN: [id number]  Arrival date & time 07/25/12  1931   First MD Initiated Contact with Patient 07/25/12 2045     Chief Complaint  Patient presents with  . Headache  . Hypertension  . Nausea   HPI: Trevor Murphy is a 77 yo AAM with history of HTN and glaucoma presents with vertigo. Symptoms present when he awoke this morning. Symptoms worsened with head movement but still present even at rest. He has had associated nausea and vomiting. Has taken Meclizine without relief. He had similar symptoms earlier this year that took several days to resolve. He denies HA, blurred vision, abdominal pain, chest pain or trouble breathing. He does endorse some unsteadiness when walking.   Past Medical History  Diagnosis Date  . Poor circulation   . Hernia     umbilical  . Gout   . Glaucoma   . Hypertension     does not see a cardiologist  . Arthritis     Past Surgical History  Procedure Date  . Hernia repair   . Ventral hernia repair 03/07/2012    Procedure: LAPAROSCOPIC VENTRAL HERNIA;  Surgeon: Gwenyth Ober, MD;  Location: [facility] [place];  Service: General;  Laterality: N/A;    No family history on file.  History  Substance Use Topics  . Smoking status: Never Smoker   . Smokeless tobacco: Not on file  . Alcohol Use: 0.6 oz/week    1 Cans of beer per week     Review of Systems  Constitutional: Negative for fever, chills and fatigue.  HENT: Negative for trouble swallowing, neck pain, neck stiffness and voice change.   Eyes: Negative for photophobia and visual disturbance.  Respiratory: Negative for cough, shortness of breath and wheezing.   Cardiovascular: Negative for chest pain and palpitations.  Gastrointestinal: Positive for nausea and vomiting. Negative for abdominal pain and diarrhea.  Genitourinary: Negative for dysuria, urgency and decreased urine volume.  Musculoskeletal: Positive for gait problem. Negative for myalgias, back pain and arthralgias.  Skin: Negative for  pallor and rash.  Neurological: Positive for dizziness. Negative for syncope, weakness, light-headedness and headaches.  Psychiatric/Behavioral: Negative for confusion and agitation.   Allergies  Review of patient's allergies indicates no known allergies.  Home Medications   Current Outpatient Rx  Name  Route  Sig  Dispense  Refill  . GOODY HEADACHE PO   Oral   Take 1 packet by mouth daily as needed. For pain.         [name] Kitchen LISINOPRIL-HYDROCHLOROTHIAZIDE 20-25 MG PO TABS   Oral   Take 1 tablet by mouth daily.          . ALLOPURINOL 100 MG PO TABS   Oral   Take 100 mg by mouth daily.          [name] Kitchen BISMUTH SUBSALICYLATE [id number] PO SUSP   Oral   Take 15 mLs by mouth every 6 (six) hours as needed. For upset stomach         . CARVEDILOL 12.5 MG PO TABS   Oral   Take 12.5 mg by mouth 2 (two) times daily with a meal.         . CLONIDINE HCL 0.2 MG PO TABS   Oral   Take 0.2 mg by mouth 2 (two) times daily.          . COLCHICINE 0.6 MG PO TABS   Oral   Take 1 tablet (0.6 mg total) by mouth 2 (  two) times daily.   30 tablet   0   . ROSUVASTATIN CALCIUM 5 MG PO TABS   Oral   Take 5 mg by mouth daily.            BP 171/86  Pulse 69  Temp 97.5 F (36.4 C) (Oral)  Resp 24  SpO2 100%  Physical Exam  Nursing note and vitals reviewed. Constitutional: He is oriented to person, place, and time. He is cooperative. He appears distressed.       Elderly male, pleasant. Appears uncomfortable.   HENT:  Head: Normocephalic and atraumatic.  Mouth/Throat: Mucous membranes are normal. Abnormal dentition. Dental caries present.  Eyes: Right conjunctiva is injected. Left conjunctiva is injected. Right eye exhibits nystagmus. Left eye exhibits nystagmus. Right pupil is reactive. Left pupil is not reactive (glaucoma).  Neck: Trachea normal and normal range of motion. Neck supple. No JVD present.  Cardiovascular: Normal rate, regular rhythm, S1 normal, S2 normal and normal  heart sounds.  Exam reveals no decreased pulses.   Pulmonary/Chest: Effort normal and breath sounds normal. He has no decreased breath sounds.  Abdominal: Soft. Normal appearance and bowel sounds are normal. There is no tenderness.  Musculoskeletal: Normal range of motion. He exhibits no edema.  Neurological: He is alert and oriented to person, place, and time. He has normal reflexes. No cranial nerve deficit. Coordination normal.  Skin: Skin is warm and dry. No rash noted.    ED Course  Procedures   Labs Reviewed  CBC WITH DIFFERENTIAL - Abnormal; Notable for the following:    RDW 15.9 (*)     Neutrophils Relative 87 (*)     Neutro Abs 8.3 (*)     Lymphocytes Relative 10 (*)     All other components within normal limits  COMPREHENSIVE METABOLIC PANEL - Abnormal; Notable for the following:    Glucose, Bld 181 (*)     BUN 27 (*)     Creatinine, Ser 1.62 (*)     GFR calc non Af Amer 40 (*)     GFR calc Af Amer 46 (*)     All other components within normal limits  URINALYSIS, MICROSCOPIC ONLY - Abnormal; Notable for the following:    Protein, ur 100 (*)     Casts HYALINE CASTS (*)     All other components within normal limits   Ct Head Wo Contrast  07/25/2012  *RADIOLOGY REPORT*  Clinical Data: Headache, hypertension.  CT HEAD WITHOUT CONTRAST  Technique:  Contiguous axial images were obtained from the base of the skull through the vertex without contrast.  Comparison: 10/27/2011  Findings: Prominence of the sulci, cisterns, and ventricles, in keeping with volume loss. There are subcortical and periventricular white matter hypodensities, a nonspecific finding most often seen with chronic microangiopathic changes.  There is no evidence for acute hemorrhage, overt hydrocephalus, mass lesion, or abnormal extra-axial fluid collection.  No definite CT evidence for acute cortical based (large artery) infarction. The visualized paranasal sinuses and mastoid air cells are predominately clear.   IMPRESSION: Volume loss and white matter changes.  No CT evidence of acute intracranial abnormality.   Original Report Authenticated By: Carlos Levering, M.D.    1. Poor circulation   2. CKD (chronic kidney disease)   3. HTN (hypertension)   4. N&V (nausea and vomiting)   5. Vertigo    MDM  77 yo AAM with history of HTN and glaucoma presents with vertigo. Symptoms present when he awoke this  morning. Afebrile, hypertensive on arrival. Due to persistence of symptoms despite treatment concern for central cause of vertigo. Head CT without acute abnormality. ECG without acute ischemia. Cr at baseline 1.6, normal lytes, glucose 180, CBC unremarkable. UA without infection. Treated with Zofran 8 mg and meclizine 25 mg with only minimal improvement of vertigo. No further vomiting in ED. Discussed case with Neurology who recommended MRI in AM. They feel this will likely be peripheral in nature but must r/o central cause first. Admitted to Hospitalist service in stable condition. Discussed w/u with patient and need for admission, he is in agreement with plan.   Reviewed imaging, labs and previous medical records, utilized in MDM  Discussed case with Dr. Christy Gentles  ECG: SR, rate 69, normal axis, 1st AVB, prolonged QTc, no ischemia.   Clinical Impression 1. Vertigo 2. Nausea and vomiting 3. CKD       Louretta Shorten, MD 07/26/12 [phone]

## 2012-07-25 NOTE — ED Notes (Addendum)
Trevor Murphy (son) [phone]

## 2012-07-25 NOTE — ED Notes (Signed)
Pt reports headache, high blood pressure, and nausea since this am. 20g(L)FA. Pt given 4mg  of zofran. 180systolic en route.

## 2012-07-25 NOTE — ED Notes (Signed)
Patient transported to CT 

## 2012-07-26 ENCOUNTER — Observation Stay (HOSPITAL_COMMUNITY): Payer: Medicare Other

## 2012-07-26 ENCOUNTER — Encounter (HOSPITAL_COMMUNITY): Payer: Self-pay | Admitting: Internal Medicine

## 2012-07-26 DIAGNOSIS — N189 Chronic kidney disease, unspecified: Secondary | ICD-10-CM

## 2012-07-26 DIAGNOSIS — I130 Hypertensive heart and chronic kidney disease with heart failure and stage 1 through stage 4 chronic kidney disease, or unspecified chronic kidney disease: Secondary | ICD-10-CM | POA: Diagnosis present

## 2012-07-26 DIAGNOSIS — I10 Essential (primary) hypertension: Secondary | ICD-10-CM

## 2012-07-26 DIAGNOSIS — R112 Nausea with vomiting, unspecified: Secondary | ICD-10-CM

## 2012-07-26 DIAGNOSIS — N1832 Chronic kidney disease, stage 3b: Secondary | ICD-10-CM | POA: Diagnosis present

## 2012-07-26 DIAGNOSIS — N183 Chronic kidney disease, stage 3 unspecified: Secondary | ICD-10-CM | POA: Diagnosis present

## 2012-07-26 DIAGNOSIS — I999 Unspecified disorder of circulatory system: Secondary | ICD-10-CM

## 2012-07-26 DIAGNOSIS — R0989 Other specified symptoms and signs involving the circulatory and respiratory systems: Secondary | ICD-10-CM | POA: Insufficient documentation

## 2012-07-26 DIAGNOSIS — R42 Dizziness and giddiness: Secondary | ICD-10-CM

## 2012-07-26 HISTORY — DX: Chronic kidney disease, stage 3 unspecified: N18.30

## 2012-07-26 LAB — CBC
HCT: 42.9 % (ref 39.0–52.0)
Hemoglobin: 14 g/dL (ref 13.0–17.0)
MCH: 26.7 pg (ref 26.0–34.0)
MCHC: 32.6 g/dL (ref 30.0–36.0)
MCV: 81.7 fL (ref 78.0–100.0)
Platelets: 192 10*3/uL (ref 150–400)
RBC: 5.25 MIL/uL (ref 4.22–5.81)
RDW: 16 % — ABNORMAL HIGH (ref 11.5–15.5)
WBC: 7.6 10*3/uL (ref 4.0–10.5)

## 2012-07-26 LAB — BASIC METABOLIC PANEL
BUN: 23 mg/dL (ref 6–23)
CO2: 25 mEq/L (ref 19–32)
Calcium: 9.5 mg/dL (ref 8.4–10.5)
Chloride: 105 mEq/L (ref 96–112)
Creatinine, Ser: 1.52 mg/dL — ABNORMAL HIGH (ref 0.50–1.35)
GFR calc Af Amer: 50 mL/min — ABNORMAL LOW (ref >90)
GFR calc non Af Amer: 43 mL/min — ABNORMAL LOW (ref >90)
Glucose, Bld: 123 mg/dL — ABNORMAL HIGH (ref 70–99)
Potassium: 4.6 mEq/L (ref 3.5–5.1)
Sodium: 141 mEq/L (ref 135–145)

## 2012-07-26 MED ORDER — LATANOPROST 0.005 % OP SOLN
1.0000 [drp] | Freq: Every day | OPHTHALMIC | Status: DC
  Administered 2012-07-27 (×2): 1 [drp] via OPHTHALMIC
  Filled 2012-07-26 (×3): qty 2.5

## 2012-07-26 MED ORDER — SODIUM CHLORIDE 0.9 % IV SOLN: INTRAVENOUS | Status: AC

## 2012-07-26 MED ORDER — GADOBENATE DIMEGLUMINE 529 MG/ML IV SOLN
14.0000 mL | Freq: Once | INTRAVENOUS | Status: AC
  Administered 2012-07-26: 14 mL via INTRAVENOUS

## 2012-07-26 MED ORDER — STROKE: EARLY STAGES OF RECOVERY BOOK
Freq: Once | Status: AC
  Administered 2012-07-26: 13:00:00
  Filled 2012-07-26: qty 1

## 2012-07-26 MED ORDER — SODIUM CHLORIDE 0.9 % IV SOLN
INTRAVENOUS | Status: DC
  Administered 2012-07-26: 04:00:00 via INTRAVENOUS

## 2012-07-26 MED ORDER — CLONIDINE HCL 0.2 MG PO TABS
0.2000 mg | ORAL_TABLET | Freq: Two times a day (BID) | ORAL | Status: DC
  Administered 2012-07-26: 0.2 mg via ORAL
  Filled 2012-07-26 (×2): qty 1

## 2012-07-26 MED ORDER — ASPIRIN EC 81 MG PO TBEC
81.0000 mg | DELAYED_RELEASE_TABLET | Freq: Every day | ORAL | Status: DC
  Administered 2012-07-26 – 2012-07-28 (×3): 81 mg via ORAL
  Filled 2012-07-26 (×3): qty 1

## 2012-07-26 MED ORDER — SODIUM CHLORIDE 0.9 % IV SOLN
INTRAVENOUS | Status: DC
  Administered 2012-07-27: 17:00:00 via INTRAVENOUS

## 2012-07-26 MED ORDER — LORAZEPAM 2 MG/ML IJ SOLN: 0.5000 mg | Freq: Once | INTRAMUSCULAR | Status: AC

## 2012-07-26 MED ORDER — MECLIZINE HCL 25 MG PO TABS
25.0000 mg | ORAL_TABLET | Freq: Three times a day (TID) | ORAL | Status: DC | PRN
  Administered 2012-07-26 – 2012-07-27 (×2): 25 mg via ORAL
  Filled 2012-07-26 (×2): qty 1

## 2012-07-26 MED ORDER — ONDANSETRON HCL 8 MG PO TABS
4.0000 mg | ORAL_TABLET | Freq: Four times a day (QID) | ORAL | Status: DC | PRN
  Filled 2012-07-26: qty 0.5

## 2012-07-26 MED ORDER — SODIUM CHLORIDE 0.9 % IJ SOLN
3.0000 mL | Freq: Two times a day (BID) | INTRAMUSCULAR | Status: DC
  Administered 2012-07-26 (×2): 3 mL via INTRAVENOUS

## 2012-07-26 MED ORDER — ONDANSETRON HCL 4 MG/2ML IJ SOLN: 4.0000 mg | Freq: Four times a day (QID) | INTRAMUSCULAR | Status: DC | PRN

## 2012-07-26 MED ORDER — PNEUMOCOCCAL VAC POLYVALENT 25 MCG/0.5ML IJ INJ
0.5000 mL | INJECTION | INTRAMUSCULAR | Status: AC
  Filled 2012-07-26: qty 0.5

## 2012-07-26 MED ORDER — CARVEDILOL 12.5 MG PO TABS
12.5000 mg | ORAL_TABLET | Freq: Two times a day (BID) | ORAL | Status: DC
  Administered 2012-07-26: 12.5 mg via ORAL
  Filled 2012-07-26 (×4): qty 1

## 2012-07-26 MED ORDER — LORAZEPAM 2 MG/ML IJ SOLN
INTRAMUSCULAR | Status: AC
  Administered 2012-07-26: 0.5 mg
  Filled 2012-07-26: qty 1

## 2012-07-26 NOTE — Consult Note (Signed)
Reason for Consult: Vertigo Referring Physician: Steward Ros  CC: Vertigo  History is obtained from:Patient  HPI: Trevor Murphy is a 77 y.o. male with a history of vertigo present on awakening. It is present even at rest. He denies tinnitus or hearing change. He has had another episode similar in the past. He continues to have symptoms unchanged despite meclizine.    ROS: A 14 point ROS was performed and is negative except as noted in the HPI.  Past Medical History  Diagnosis Date  . Poor circulation   . Hernia     umbilical  . Gout   . Glaucoma   . Hypertension     does not see a cardiologist  . Arthritis     Family History: No history of cva or vertigo  Social History: Tob: denies  Exam: Current vital signs: BP 179/87  Pulse 71  Temp 97.9 F (36.6 C) (Oral)  Resp 19  SpO2 100% Vital signs in last 24 hours: Temp:  [97.5 F (36.4 C)-97.9 F (36.6 C)] 97.9 F (36.6 C) (12/16 2223) Pulse Rate:  [58-87] 71  (12/16 2227) Resp:  [16-24] 19  (12/16 2130) BP: (156-190)/(67-94) 179/87 mmHg (12/16 2227) SpO2:  [96 %-100 %] 100 % (12/16 2227)  General: In bed, holding emesis bag CV: RRR ENT: TM without erythema Mental Status: Patient is awake, alert, oriented to person, place, month, year, and situation. Immediate and remote memory are intact. Patient is able to give a clear and coherent history. Cranial Nerves: II: Visual Fields are full. Pupils are equal, round, and reactive to light.  Discs are difficult to visualize. III,IV, VI: EOMI in right eye with sustained nystagmus on leftward gaze. Head thrust reveals persistent nystagmus on rightward turn.  V: Facial sensation is symmetric to temperature VII: Facial movement is symmetric.  VIII: hearing is intact to voice X: Uvula elevates symmetrically XI: Shoulder shrug is symmetric. XII: tongue is midline without atrophy or fasciculations.  Motor: Tone is normal. Bulk is normal. 5/5 strength was present in all  four extremities.  Sensory: Sensation is symmetric to light touch and temperature in the arms and legs. Deep Tendon Reflexes: 2+ and symmetric in the biceps and patellae.  Cerebellar: FNF with passpointing, but no horizontal tremor on right. HKS with difficulty on right.  Gait: Did not assess 2/2 patient safety concerns. (when asked pt states "I'll just fall right down")  I have reviewed labs in epic and the results pertinent to this consultation are: Mild renal insufficiency  I have reviewed the images obtained:CT head - negative  Impression: 77 yo M with persistent vertigo. I suspect a peripheral etiology such as vestibular neuronitis, however a central etiology needs to be ruled out given some difficulty with right sided coordination.   Recommendations: 1) MRI brain 2) If positive, would pursue stroke workup.  3) If negative, could consider steroids for vestibular neuronitis.  4) Could use meclizine in short term, but would not continue for more than 48 hours.    Roland Rack, MD [facility] [phone]  If 7pm- 7am, please page neurology on call at [phone].

## 2012-07-26 NOTE — Care Management Note (Signed)
    Page 1 of 2   07/28/2012     11:09:20 AM   CARE MANAGEMENT NOTE 07/28/2012  Patient:  Trevor Murphy, Trevor Murphy   Account Number:  [id number]  Date Initiated:  07/26/2012  Documentation initiated by:  GRAVES-BIGELOW,BRENDA  Subjective/Objective Assessment:   Pt admitted with dizziness.     Action/Plan:   Cm will continue to monitor for disposition needs.   Anticipated DC Date:  07/28/2012   Anticipated DC Plan:  [place]  CM consult      [facility] Choice  HOME HEALTH   Choice offered to / List presented to:  C-1 Patient        [place] arranged  HH-1 RN  [place].   Status of service:  Completed, signed off Medicare Important Message given?   (If response is "NO", the following Medicare IM given date fields will be blank) Date Medicare IM given:   Date Additional Medicare IM given:    Discharge Disposition:  [place]  Per UR Regulation:  Reviewed for med. necessity/level of care/duration of stay  If discussed at [place] of Stay Meetings, dates discussed:    Comments:  07/28/12- 13- Marvetta Gibbons RN, BSN [phone] [facility]- RN/PT orders for discharge- spoke with pt at bedside- per conversation pt states that he has used [facility] in past with his wife- would like to use [facility] for his [facility] services. Pt reports that he already has RW at home, transporatation home, and no issues getting medications. Referral called to Butch Penny with [facility] for [facility] services. Services to begin within 24-48 hr. post discharge.

## 2012-07-26 NOTE — ED Notes (Signed)
Swallow Screen completed at 22:23.

## 2012-07-26 NOTE — Evaluation (Signed)
Physical Therapy Evaluation Patient Details Name: Trevor Murphy MRN: [id number] DOB: 12/10/1934 Today's Date: 07/26/2012 Time: [id number] PT Time Calculation (min): 45 min  PT Assessment / Plan / Recommendation Clinical Impression  77 yo adm with vertigo with +N/V which has improved with meclizine and anti-emetics. Pt presents with combination of symptoms that are neither clearly peripheral or central vertigo. Pt was up to 8/10 with dizziness/vertigo after partial testing and further testing deferred until results of MRI obtained. Will follow.    PT Assessment  Patient needs continued PT services    Follow Up Recommendations  Home health PT;Supervision - Intermittent    Does the patient have the potential to tolerate intense rehabilitation      Barriers to Discharge Decreased caregiver support wife just recently d/c'd from hospital and requires physical assist    Equipment Recommendations  Rolling walker with 5" wheels    Recommendations for Other Services OT consult   Frequency Min 4X/week    Precautions / Restrictions Precautions Precautions: Fall;Other (comment) (Lt eye blind)   Pertinent Vitals/Pain BP steadily increased from 97/64 supine to 120/74 after ambulation dizzinesss 8/10 at worst      Mobility  Bed Mobility Bed Mobility: Rolling Right;Rolling Left;Right Sidelying to Sit;Sitting - Scoot to Edge of Bed;Sit to Supine Rolling Right: 6: Modified independent (Device/Increase time);With rail Rolling Left: 6: Modified independent (Device/Increase time);With rail Right Sidelying to Sit: 5: Supervision;With rails;HOB flat Sitting - Scoot to Edge of Bed: 5: Supervision Sit to Supine: 6: Modified independent (Device/Increase time);HOB flat;With rail Details for Bed Mobility Assistance: supervision coming up to EOB due to dizziness (lightheadedness); no vertigo with rolling Rt; no vertigo with rolling Lt, however + horizontal nystagmus (beat to Rt) lasting >90 seconds; pt  unable to suppress nystagmus with gaze stabilization or closing eyes; nystagmus stopped in < 5 sec when rolled back to supine  Transfers Transfers: Sit to Stand;Stand to Sit Sit to Stand: 4: Min guard;With upper extremity assist;From bed Stand to Sit: 4: Min guard;With upper extremity assist;To bed Details for Transfer Assistance: denied lightheadedness or vertigo Ambulation/Gait Ambulation/Gait Assistance: 4: Min guard Ambulation Distance (Feet): 100 Feet Assistive device: Rolling walker Ambulation/Gait Assistance Details: able to maintain straight path; did not ask pt to perform head turns; Turning to Rt pt with no vertigo; initial turn to Lt, instructed pt in turning eyes ~30 degrees to find fixed target and then turn body until 180 degree turn completed (pt asymptomatic);  Gait Pattern: Step-through pattern;Trunk flexed Gait velocity: decr         Other vestibular  pt able to track pen with eyes with no nystagmus or vertigo; Hall-pike and VOR testing deferred until MRI results known as this testing will certainly make him feel vertigo and potentially n/v   PT Diagnosis: Difficulty walking;Other (comment) (vertigo)  PT Problem List: Decreased activity tolerance;Decreased balance;Decreased mobility;Decreased knowledge of use of DME;Other (comment) (vertigo) PT Treatment Interventions: DME instruction;Gait training;Stair training;Functional mobility training;Therapeutic activities;Therapeutic exercise;Balance training;Neuromuscular re-education;Patient/family education   PT Goals Acute Rehab PT Goals PT Goal Formulation: With patient Time For Goal Achievement: 08/02/12 Potential to Achieve Goals: Good Pt will Roll Supine to Left Side: Independently;Other (comment) PT Goal: Rolling Supine to Left Side - Progress: Goal set today Pt will go Supine/Side to Sit: with modified independence;with HOB 0 degrees PT Goal: Supine/Side to Sit - Progress: Goal set today Pt will go Sit to  Supine/Side: with modified independence;with HOB 0 degrees PT Goal: Sit to Supine/Side - Progress: Goal set  today Pt will go Sit to Stand: with modified independence;with upper extremity assist PT Goal: Sit to Stand - Progress: Goal set today Pt will go Stand to Sit: with modified independence;with upper extremity assist PT Goal: Stand to Sit - Progress: Goal set today Pt will Ambulate: >150 feet;with modified independence;with least restrictive assistive device PT Goal: Ambulate - Progress: Goal set today Pt will Go Up / Down Stairs: 1-2 stairs;with min assist;with least restrictive assistive device PT Goal: Up/Down Stairs - Progress: Goal set today Pt will Perform Home Exercise Program: with supervision, verbal cues required/provided PT Goal: Perform Home Exercise Program - Progress: Goal set today Additional Goals Additional Goal #1: Will tolerate all mobility with dizziness <3/10 PT Goal: Additional Goal #1 - Progress: Goal set today  Visit Information  Last PT Received On: 07/26/12 Assistance Needed: +1    Subjective Data  Subjective: Pt reports his dizziness has been less today; no nausea or vomiting since 03:30 a.m.  Reports spinning sensation to his Rt happens everytime he turns his head to the Lt (whether lying or sitting). Only thing he does that will make it stop is turn his head back to middle, or to the Rt, or sometimes looking up.  Patient Stated Goal: stop vertigo and go home    Prior [place] With: Spouse (on HD; helps her bathing/dressing; ) Available Help at Discharge: Family (wife's daughter available 24/7 (drives wife to HD)) Type of Home: House Home Access: Stairs to enter Technical brewer of Steps: 2 Entrance Stairs-Rails: None Home Layout: One level Bathroom Shower/Tub: Product/process development scientist: Standard Bathroom Accessibility: Yes How Accessible: Accessible via walker [place]: Shower chair with  back;Walker - rolling (RW bought for wife (does not use)) Prior Function Level of Independence: Independent Able to Take Stairs?: Yes Driving: No Vocation: Retired Corporate investment banker: No difficulties    Cognition  Overall Cognitive Status: Appears within functional limits for tasks assessed/performed Arousal/Alertness: Awake/alert Orientation Level: Appears intact for tasks assessed Behavior During Session: [facility] for tasks performed    Extremity/Trunk Assessment Right Lower Extremity Assessment RLE ROM/Strength/Tone: [facility] for tasks assessed RLE Coordination: Deficits RLE Coordination Deficits: per MD notes Left Lower Extremity Assessment LLE ROM/Strength/Tone: WFL for tasks assessed   Balance Balance Balance Assessed: Yes Static Sitting Balance Static Sitting - Balance Support: Bilateral upper extremity supported;Feet supported Static Sitting - Level of Assistance: 6: Modified independent (Device/Increase time) Static Standing Balance Static Standing - Balance Support: Bilateral upper extremity supported Static Standing - Level of Assistance: 5: Stand by assistance  End of Session PT - End of Session Equipment Utilized During Treatment: Gait belt Activity Tolerance: Other (comment) (limted by vertigo (up to 8/10)) Patient left: in bed;with call bell/phone within reach;with family/visitor present  GP Functional Assessment Tool Used: clinical judgment Functional Limitation: Mobility: Walking and moving around Mobility: Walking and Moving Around Current Status [phone]): At least 20 percent but less than 40 percent impaired, limited or restricted Mobility: Walking and Moving Around Goal Status [phone]): At least 1 percent but less than 20 percent impaired, limited or restricted   Breannah Kratt 07/26/2012, 5:02 PM  Pager [phone]

## 2012-07-26 NOTE — Progress Notes (Signed)
Patient home Medication clarification:  Per Patient, he takes 3 scheduled medication at home. One is a peach color pill for his high blood pressure which he thinks is lopressor; one is Xalatan which has been resumed today; another one is crestor, which he gets free samples from his doctor's office. I called [facility] where he gets his refills from. Per their record, he got the last refill of his lisinopril-HCTZ 20-25 on November 2013. The lisinopril-HCTZ is a peach color pill.   I updated his home med list   Thanks.   Maryanna Shape, PharmD, BCPS  Clinical Pharmacist  Pager: [phone]

## 2012-07-26 NOTE — Progress Notes (Signed)
Pts BP 84/50 manually; pts only c/o being sleepy; Dr.Vann paged and made aware, new orders received; will continue to monitor

## 2012-07-26 NOTE — Progress Notes (Signed)
[facility] PROGRESS NOTE  Trevor Murphy [id number] DOB: 1934-11-15 DOA: 07/25/2012 PCP: Janne Napoleon, NP  Assessment/Plan: Vertigo: much improved this am. MRI pending. Will continue meclizine. Taking po's well will decrease IV fluid rate. Request PT consult Active Problems:  HTN (hypertension) : only fair control. Continue clonidine and BB. monitor CKD (chronic kidney disease) : chart review indicates creatinine baseline. Will hold any nephrotoxins. monitor N&V (nausea and vomiting): nausea much improved. No vomiting. Ate 100% breakfast. Continue zofran prn. monitor  Code Status: full Family Communication: pt at bedside Disposition Plan: home with wife when ready likely 24 hours   Consultants:  neuro  Procedures:  none  Antibiotics:  none  HPI/Subjective: Awake alert oriented x3. Denies pain/discomfort. States "i feel a lot better this am, head not so swimmy".  Objective: Filed Vitals:   07/26/12 0215 07/26/12 0400 07/26/12 0600 07/26/12 0754  BP: 162/92 146/82 146/82 150/78  Pulse: 73 64 60 66  Temp:  97.6 F (36.4 C) 97.7 F (36.5 C) 98.6 F (37 C)  TempSrc:    Oral  Resp:  18 18 20   Height:  5\' 10"  (1.778 m)    Weight:  73.256 kg (161 lb 8 oz)  68.176 kg (150 lb 4.8 oz)  SpO2: 98% 99% 100% 98%   No intake or output data in the 24 hours ending 07/26/12 0822 Filed Weights   07/26/12 0400 07/26/12 0754  Weight: 73.256 kg (161 lb 8 oz) 68.176 kg (150 lb 4.8 oz)    Exam:   General:  Awake alert oriented NAD  Cardiovascular: RRR No MGR no LEE PPP  Respiratory: normal effort. BSCTAB No wheeze/rhonchi  Abdomen: soft +BS non-tender to palpation  Neuro: speech clear, facial symmetry, cranial nerve II-XII intact  Data Reviewed: Basic Metabolic Panel:  Lab [id number] 07/25/12 1955  NA 141 138  K 4.6 4.1  CL 105 101  CO2 25 23  GLUCOSE 123* 181*  BUN 23 27*  CREATININE 1.52* 1.62*  CALCIUM 9.5 9.2  MG -- --  PHOS -- --   Liver Function  Tests:  Lab 07/25/12 1955  AST 17  ALT 8  ALKPHOS 78  BILITOT 0.4  PROT 7.5  ALBUMIN 3.8   No results found for this basename: LIPASE:5,AMYLASE:5 in the last 168 hours No results found for this basename: AMMONIA:5 in the last 168 hours CBC:  Lab 07/26/12 0535 07/25/12 1955  WBC 7.6 9.6  NEUTROABS -- 8.3*  HGB 14.0 14.0  HCT 42.9 42.5  MCV 81.7 82.2  PLT 192 154   Cardiac Enzymes: No results found for this basename: CKTOTAL:5,CKMB:5,CKMBINDEX:5,TROPONINI:5 in the last 168 hours BNP (last 3 results) No results found for this basename: PROBNP:3 in the last 8760 hours CBG: No results found for this basename: GLUCAP:5 in the last 168 hours  No results found for this or any previous visit (from the past 240 hour(s)).   Studies: Ct Head Wo Contrast  07/25/2012  *RADIOLOGY REPORT*  Clinical Data: Headache, hypertension.  CT HEAD WITHOUT CONTRAST  Technique:  Contiguous axial images were obtained from the base of the skull through the vertex without contrast.  Comparison: 10/27/2011  Findings: Prominence of the sulci, cisterns, and ventricles, in keeping with volume loss. There are subcortical and periventricular white matter hypodensities, a nonspecific finding most often seen with chronic microangiopathic changes.  There is no evidence for acute hemorrhage, overt hydrocephalus, mass lesion, or abnormal extra-axial fluid collection.  No definite CT evidence for acute cortical based (large  artery) infarction. The visualized paranasal sinuses and mastoid air cells are predominately clear.  IMPRESSION: Volume loss and white matter changes.  No CT evidence of acute intracranial abnormality.   Original Report Authenticated By: Carlos Levering, M.D.     Scheduled Meds:   . aspirin EC  81 mg Oral Daily  . carvedilol  12.5 mg Oral BID WC  . cloNIDine  0.2 mg Oral BID  . sodium chloride  3 mL Intravenous Q12H   Continuous Infusions:   Principal Problem:  *Vertigo Active Problems:   HTN (hypertension)  CKD (chronic kidney disease)  N&V (nausea and vomiting)    Time spent: 30 minutes    [facility]  If 8PM-8AM, please contact night-coverage at [email or web address], password [facility] 07/26/2012, 8:22 AM  LOS: 1 day

## 2012-07-26 NOTE — H&P (Signed)
PCP:   MABE,Kyndahl Jablon, NP   Chief Complaint:  dizzy  HPI: 77 yo male with positional vertigo for most of the day today that is better when laying still and worse with movement.  This has happened to him once before in march of this year and it took days to go away.  Today is worse, with n/v nonbloody.  No fevers.  No rashes.  No focal neuro deficits other than the dizziness.  Received several doses of meclizine and improved but dizziness still present.  N/v better.  No abd pain.  No cp, no sob.  Review of Systems:  O/w neg  Past Medical History: Past Medical History  Diagnosis Date  . Poor circulation   . Hernia     umbilical  . Gout   . Glaucoma   . Hypertension     does not see a cardiologist  . Arthritis    Past Surgical History  Procedure Date  . Hernia repair   . Ventral hernia repair 03/07/2012    Procedure: LAPAROSCOPIC VENTRAL HERNIA;  Surgeon: Gwenyth Ober, MD;  Location: [place];  Service: General;  Laterality: N/A;    Medications: Prior to Admission medications   Medication Sig Start Date End Date Taking? Authorizing Provider  Aspirin-Acetaminophen-Caffeine (GOODY HEADACHE PO) Take 1 packet by mouth daily as needed. For pain.   Yes Historical Provider, MD  lisinopril-hydrochlorothiazide (PRINZIDE,ZESTORETIC) 20-25 MG per tablet Take 1 tablet by mouth daily.    Yes Historical Provider, MD  allopurinol (ZYLOPRIM) 100 MG tablet Take 100 mg by mouth daily.     Historical Provider, MD  bismuth subsalicylate (PEPTO BISMOL) 262 MG/15ML suspension Take 15 mLs by mouth every 6 (six) hours as needed. For upset stomach    Historical Provider, MD  carvedilol (COREG) 12.5 MG tablet Take 12.5 mg by mouth 2 (two) times daily with a meal.    Historical Provider, MD  cloNIDine (CATAPRES) 0.2 MG tablet Take 0.2 mg by mouth 2 (two) times daily.     Historical Provider, MD  colchicine 0.6 MG tablet Take 1 tablet (0.6 mg total) by mouth 2 (two) times daily. 10/15/11 10/14/12  [name],  MD  rosuvastatin (CRESTOR) 5 MG tablet Take 5 mg by mouth daily.     Historical Provider, MD    Allergies:  No Known Allergies  Social History:  reports that he has never smoked. He does not have any smokeless tobacco history on file. He reports that he drinks about .6 ounces of alcohol per week. He reports that he does not use illicit drugs.  Family History: neg  Physical Exam: Filed Vitals:   07/25/12 2045 07/25/12 2130 07/25/12 2223 07/25/12 2227  BP: 156/67 173/91  179/87  Pulse: 72 68  71  Temp:   97.9 F (36.6 C)   TempSrc:      Resp: 20 19    SpO2: 96% 97%  100%   General appearance: alert, cooperative and no distress Neck: no JVD and supple, symmetrical, trachea midline Lungs: clear to auscultation bilaterally Heart: regular rate and rhythm, S1, S2 normal, no murmur, click, rub or gallop Abdomen: soft, non-tender; bowel sounds normal; no masses,  no organomegaly Extremities: extremities normal, atraumatic, no cyanosis or edema Pulses: 2+ and symmetric Skin: Skin color, texture, turgor normal. No rashes or lesions Neurologic: Grossly normal    Labs on Admission:   [facility] 07/25/12 1955  NA 138  K 4.1  CL 101  CO2 23  GLUCOSE 181*  BUN 27*  CREATININE 1.62*  CALCIUM 9.2  MG --  PHOS --    Basename 07/25/12 1955  AST 17  ALT 8  ALKPHOS 78  BILITOT 0.4  PROT 7.5  ALBUMIN 3.8    Basename 07/25/12 1955  WBC 9.6  NEUTROABS 8.3*  HGB 14.0  HCT 42.5  MCV 82.2  PLT 154   Radiological Exams on Admission: Ct Head Wo Contrast  07/25/2012  *RADIOLOGY REPORT*  Clinical Data: Headache, hypertension.  CT HEAD WITHOUT CONTRAST  Technique:  Contiguous axial images were obtained from the base of the skull through the vertex without contrast.  Comparison: 10/27/2011  Findings: Prominence of the sulci, cisterns, and ventricles, in keeping with volume loss. There are subcortical and periventricular white matter hypodensities, a nonspecific finding most often  seen with chronic microangiopathic changes.  There is no evidence for acute hemorrhage, overt hydrocephalus, mass lesion, or abnormal extra-axial fluid collection.  No definite CT evidence for acute cortical based (large artery) infarction. The visualized paranasal sinuses and mastoid air cells are predominately clear.  IMPRESSION: Volume loss and white matter changes.  No CT evidence of acute intracranial abnormality.   Original Report Authenticated By: Carlos Levering, M.D.     Assessment/Plan 77 yo male with vertigo cannot r/o cva  Principal Problem:  *Vertigo Active Problems:  HTN (hypertension)  CKD (chronic kidney disease)  N&V (nausea and vomiting)  obs to get mri.  cth neg.  ekg neg.  ivf and meclizine overnight.  Neurology consulted.  Further w/u per neurology recommendations.  Clarify home meds.  Asa.    Katryna Tschirhart A 07/26/2012, 12:59 AM

## 2012-07-26 NOTE — Progress Notes (Signed)
Patient seen and examined by me.  Await MRI- if no CVA and continued symptoms could get PT vestibular to see.  From home with family.  Eulogio Bear DO

## 2012-07-26 NOTE — ED Provider Notes (Signed)
I have personally seen and examined the patient.  I have discussed the plan of care with the resident.  I have reviewed the documentation on PMH/FH/Soc. History.  I have reviewed the documentation of the resident and agree.  I have reviewed and agree with the ECG interpretation(s) documented by the resident.   Sharyon Cable, MD 07/26/12 1053

## 2012-07-27 DIAGNOSIS — N179 Acute kidney failure, unspecified: Secondary | ICD-10-CM | POA: Diagnosis present

## 2012-07-27 DIAGNOSIS — I059 Rheumatic mitral valve disease, unspecified: Secondary | ICD-10-CM

## 2012-07-27 HISTORY — DX: Chronic kidney disease, unspecified: N17.9

## 2012-07-27 LAB — BASIC METABOLIC PANEL
BUN: 32 mg/dL — ABNORMAL HIGH (ref 6–23)
CO2: 23 mEq/L (ref 19–32)
Calcium: 8.3 mg/dL — ABNORMAL LOW (ref 8.4–10.5)
Chloride: 107 mEq/L (ref 96–112)
Creatinine, Ser: 2.07 mg/dL — ABNORMAL HIGH (ref 0.50–1.35)
GFR calc Af Amer: 34 mL/min — ABNORMAL LOW (ref >90)
GFR calc non Af Amer: 30 mL/min — ABNORMAL LOW (ref >90)
Glucose, Bld: 93 mg/dL (ref 70–99)
Potassium: 4.5 mEq/L (ref 3.5–5.1)
Sodium: 139 mEq/L (ref 135–145)

## 2012-07-27 LAB — CBC
HCT: 37.8 % — ABNORMAL LOW (ref 39.0–52.0)
Hemoglobin: 12.2 g/dL — ABNORMAL LOW (ref 13.0–17.0)
MCH: 26.6 pg (ref 26.0–34.0)
MCHC: 32.3 g/dL (ref 30.0–36.0)
MCV: 82.4 fL (ref 78.0–100.0)
Platelets: 158 10*3/uL (ref 150–400)
RBC: 4.59 MIL/uL (ref 4.22–5.81)
RDW: 16.2 % — ABNORMAL HIGH (ref 11.5–15.5)
WBC: 4.4 10*3/uL (ref 4.0–10.5)

## 2012-07-27 LAB — HEMOGLOBIN A1C
Hgb A1c MFr Bld: 6 % — ABNORMAL HIGH (ref <5.7)
Mean Plasma Glucose: 126 mg/dL — ABNORMAL HIGH (ref <117)

## 2012-07-27 LAB — LIPID PANEL
Cholesterol: 124 mg/dL (ref 0–200)
HDL: 32 mg/dL — ABNORMAL LOW (ref >39)
LDL Cholesterol: 73 mg/dL (ref 0–99)
Total CHOL/HDL Ratio: 3.9 RATIO
Triglycerides: 94 mg/dL (ref <150)
VLDL: 19 mg/dL (ref 0–40)

## 2012-07-27 MED ORDER — ATORVASTATIN CALCIUM 10 MG PO TABS
10.0000 mg | ORAL_TABLET | Freq: Every day | ORAL | Status: DC
  Administered 2012-07-27: 10 mg via ORAL
  Filled 2012-07-27 (×2): qty 1

## 2012-07-27 MED ORDER — MECLIZINE HCL 25 MG PO TABS
25.0000 mg | ORAL_TABLET | Freq: Three times a day (TID) | ORAL | Status: DC
  Administered 2012-07-27 – 2012-07-28 (×3): 25 mg via ORAL
  Filled 2012-07-27 (×5): qty 1

## 2012-07-27 NOTE — Progress Notes (Signed)
VASCULAR LAB PRELIMINARY  PRELIMINARY  PRELIMINARY  PRELIMINARY  Carotid duplex  completed.    Preliminary report:  Bilateral:  No evidence of hemodynamically significant internal carotid artery stenosis.   Vertebral artery flow is antegrade.      Oluwatomisin Hustead, RVT 07/27/2012, 12:15 PM

## 2012-07-27 NOTE — Progress Notes (Signed)
Physical Therapy Treatment Patient Details Name: Trevor Murphy MRN: [id number] DOB: January 29, 1935 Today's Date: 07/27/2012 Time: [id number] PT Time Calculation (min): 40 min  PT Assessment / Plan / Recommendation Comments on Treatment Session  Pt s/p vertigo with decr symptoms to 4/10 after treatment for right posterior canal BPPV and left horizontal canal BPPV.  Hopeful that this will clear patient and he will have no further issues with vertigo however will f/u tomorrow and continue to assess and treat as needed.  MD:  Please see PT note from earlier today re: prescription for Outpt PT for vestibular rehab.  Thanks.    Follow Up Recommendations  Home health PT;Supervision - Intermittent (HHPT for vestibular rehab with transition to [place].  )                 Equipment Recommendations  Rolling walker with 5" wheels    Recommendations for Other Services OT consult  Frequency Min 4X/week   Plan Discharge plan remains appropriate;Frequency remains appropriate    Precautions / Restrictions Precautions Precautions: Fall Restrictions Weight Bearing Restrictions: No   Pertinent Vitals/Pain VSS, No pain    Mobility  Bed Mobility Bed Mobility: Rolling Right;Rolling Left;Right Sidelying to Sit;Left Sidelying to Sit;Supine to Sit Rolling Right: 6: Modified independent (Device/Increase time) Rolling Left: 6: Modified independent (Device/Increase time) Right Sidelying to Sit: 6: Modified independent (Device/Increase time) Left Sidelying to Sit: 6: Modified independent (Device/Increase time) Supine to Sit: 6: Modified independent (Device/Increase time) Sitting - Scoot to Edge of Bed: 6: Modified independent (Device/Increase time) Sit to Supine: 6: Modified independent (Device/Increase time) Sit to Sidelying Right: 6: Modified independent (Device/Increase time) Sit to Sidelying Left: 6: Modified independent (Device/Increase time) Details for Bed Mobility Assistance: Upon testing, vertigo and  nystagmus changed.  Pt with right rotary nystagmus that is now upbeating.  Treated for right posterior canal BPPV with little change in symptoms after treatment.  Then noted that patient had left beating nystagmus at rest.  Felt that crystals may have moved into the horizontal canal therefore performed BBQ roll for left horizontal canal BPPV.  Pt with symptoms 4/10 at end of this treatment.  Brother present and explained in detail the specifics of the Epley maneuver via verbal and written instruction.  Pt and brother verbalize understanding.   Transfers Transfers: Sit to Stand;Stand to Sit Sit to Stand: 4: Min guard;With upper extremity assist;From bed Stand to Sit: 4: Min guard;With upper extremity assist;To bed Details for Transfer Assistance: Decr vertigo per pt Ambulation/Gait Ambulation/Gait Assistance: Not tested (comment) Gait velocity: decreased Stairs: No Wheelchair Mobility Wheelchair Mobility: No     PT Goals Acute Rehab PT Goals PT Goal: Rolling Supine to Left Side - Progress: Progressing toward goal PT Goal: Supine/Side to Sit - Progress: Met PT Goal: Sit to Supine/Side - Progress: Met PT Goal: Sit to Stand - Progress: Progressing toward goal PT Goal: Stand to Sit - Progress: Progressing toward goal PT Goal: Ambulate - Progress: Progressing toward goal Additional Goals PT Goal: Additional Goal #1 - Progress: Progressing toward goal  Visit Information  Last PT Received On: 07/27/12 Assistance Needed: +1    Subjective Data  Subjective: Pt reports dizziness is still at 6/10 after previous treatment. Patient Stated Goal: Go home   Cognition  Overall Cognitive Status: Appears within functional limits for tasks assessed/performed Arousal/Alertness: Awake/alert Orientation Level: Appears intact for tasks assessed Behavior During Session: [facility] for tasks performed    Balance  Static Sitting Balance Static Sitting - Balance Support:  No upper extremity supported;Feet  supported Static Sitting - Level of Assistance: 6: Modified independent (Device/Increase time) Static Standing Balance Static Standing - Balance Support: Bilateral upper extremity supported;During functional activity Static Standing - Level of Assistance: 5: Stand by assistance  End of Session PT - End of Session Equipment Utilized During Treatment: Gait belt Activity Tolerance: Patient tolerated treatment well Patient left: in bed;with call bell/phone within reach;with family/visitor present Nurse Communication: Mobility status        INGOLD,Shain Pauwels 07/27/2012, 2:27 PM  [facility] Acute Rehabilitation [phone] (pager)

## 2012-07-27 NOTE — Progress Notes (Signed)
[facility] PROGRESS NOTE  Trevor Murphy [id number] DOB: 29-Jun-1935 DOA: 07/25/2012 PCP: Janne Napoleon, NP  Assessment/Plan: Vertigo: Only slight improvement this am. MRI with questionable left frontal tiny infarct and remote small infarcts involving portions of the cerebellum bilaterally, right paracentral pontine region, left basal ganglia, left thalamus and centrum semiovale bilaterally.  Will continue meclizine scheduled. Will continue asa, check lipid panel, A1C, carotid doppler and echo. Last echo 2011 with 45% EF and grade 1 diastolic dysfunction.  Resume crestor Active Problems:  HTN (hypertension) : only fair control. Continue clonidine and BB. monitor . Holding home ace and and HCTZ. CKD (chronic kidney disease) : creatine trending upward this am. chart review indicates baseline range 1.4-1.6. Holding home ace and HCTZ, Gently hydrate. monitor  N&V (nausea and vomiting): nausea much improved. No vomiting. Ate 100% breakfast. Continue zofran prn. monitor  Code Status: full Family Communication:  Disposition Plan: home with wife when ready   Consultants:  neuro  Procedures:  nonde  Antibiotics:  none  HPI/Subjective: Sitting up in bed. States that "dizziness worse today". Denies pain/discomfort  Objective: Filed Vitals:   07/26/12 2000 07/27/12 0000 07/27/12 0400 07/27/12 0804  BP: 106/67 118/73 127/78 124/63  Pulse: 72 59 52 52  Temp: 98.7 F (37.1 C) 98.4 F (36.9 C) 98 F (36.7 C) 98.2 F (36.8 C)  TempSrc: Oral Oral Oral Oral  Resp: 16 16 15 16   Height:      Weight:   73.392 kg (161 lb 12.8 oz)   SpO2: 96% 98% 99% 98%    Intake/Output Summary (Last 24 hours) at 07/27/12 1001 Last data filed at 07/27/12 0900  Gross per 24 hour  Intake    480 ml  Output    550 ml  Net    -70 ml   Filed Weights   07/26/12 0400 07/26/12 0754 07/27/12 0400  Weight: 73.256 kg (161 lb 8 oz) 68.176 kg (150 lb 4.8 oz) 73.392 kg (161 lb 12.8 oz)     Exam:   General:  Awake alert oriented NAD  Cardiovascular: RRR No MGR No LEE PPP  Respiratory: Normal effort BSCTAB No wheeze/rhonchi  Abdomen: soft +BS non-tender to palpation  Neuro: speech clear facial symmetry. Cranial nerve II-XII intact.   Data Reviewed: Basic Metabolic Panel:  Lab [id number] 07/26/12 0535 07/25/12 1955  NA 139 141 138  K 4.5 4.6 4.1  CL 107 105 101  CO2 23 25 23   GLUCOSE 93 123* 181*  BUN 32* 23 27*  CREATININE 2.07* 1.52* 1.62*  CALCIUM 8.3* 9.5 9.2  MG -- -- --  PHOS -- -- --   Liver Function Tests:  Lab 07/25/12 1955  AST 17  ALT 8  ALKPHOS 78  BILITOT 0.4  PROT 7.5  ALBUMIN 3.8   No results found for this basename: LIPASE:5,AMYLASE:5 in the last 168 hours No results found for this basename: AMMONIA:5 in the last 168 hours CBC:  Lab 07/27/12 0505 07/26/12 0535 07/25/12 1955  WBC 4.4 7.6 9.6  NEUTROABS -- -- 8.3*  HGB 12.2* 14.0 14.0  HCT 37.8* 42.9 42.5  MCV 82.4 81.7 82.2  PLT 158 192 154   Cardiac Enzymes: No results found for this basename: CKTOTAL:5,CKMB:5,CKMBINDEX:5,TROPONINI:5 in the last 168 hours BNP (last 3 results) No results found for this basename: PROBNP:3 in the last 8760 hours CBG: No results found for this basename: GLUCAP:5 in the last 168 hours  No results found for this or any previous visit (from the past  240 hour(s)).   Studies: Ct Head Wo Contrast  07/25/2012  *RADIOLOGY REPORT*  Clinical Data: Headache, hypertension.  CT HEAD WITHOUT CONTRAST  Technique:  Contiguous axial images were obtained from the base of the skull through the vertex without contrast.  Comparison: 10/27/2011  Findings: Prominence of the sulci, cisterns, and ventricles, in keeping with volume loss. There are subcortical and periventricular white matter hypodensities, a nonspecific finding most often seen with chronic microangiopathic changes.  There is no evidence for acute hemorrhage, overt hydrocephalus, mass lesion, or  abnormal extra-axial fluid collection.  No definite CT evidence for acute cortical based (large artery) infarction. The visualized paranasal sinuses and mastoid air cells are predominately clear.  IMPRESSION: Volume loss and white matter changes.  No CT evidence of acute intracranial abnormality.   Original Report Authenticated By: Carlos Levering, M.D.    Mr Jeri Cos [id number] Contrast  07/26/2012  *RADIOLOGY REPORT*  Clinical Data: Vertigo.  MRI HEAD WITHOUT AND WITH CONTRAST  Technique:  Multiplanar, multiecho pulse sequences of the brain and surrounding structures were obtained according to standard protocol without and with intravenous contrast  Contrast:  Comparison: 07/25/2012 head CT.  No comparison MR.  Findings: Questionable tiny left frontal lobe infarct (series 3 image 18).  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  Prominent small vessel disease type changes.  No intracranial hemorrhage.  No intracranial mass or abnormal enhancement.  Global atrophy without hydrocephalus.  Cervical spondylotic changes.  Major intracranial vascular structures are patent.  Paranasal sinus mucosal thickening.  Exophthalmos.  IMPRESSION: Questionable tiny left frontal lobe infarct (series 3 image 18).  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  Prominent small vessel disease type changes.   Original Report Authenticated By: Genia Del, M.D.     Scheduled Meds:   . aspirin EC  81 mg Oral Daily  . latanoprost  1 drop Both Eyes QHS  . meclizine  25 mg Oral TID  . pneumococcal 23 valent vaccine  0.5 mL Intramuscular Tomorrow-1000  . sodium chloride  3 mL Intravenous Q12H   Continuous Infusions:   . sodium chloride 100 mL/hr at 07/26/12 1640     Principal Problem:  *Vertigo Active Problems:  HTN (hypertension)  CKD (chronic kidney disease)  N&V (nausea and vomiting)    Time spent: 30 minutes    [facility]  If 8PM-8AM, please contact night-coverage at [email or web address], password [facility] 07/27/2012, 10:01 AM  LOS: 2 days

## 2012-07-27 NOTE — Progress Notes (Signed)
Stroke Team Progress Note  HISTORY KAENAN HIMMELBERGER is a 77 y.o. male with a history of vertigo present on awakening 07/26/2012. It is present even at rest. He denies tinnitus or hearing change. He has had another episode similar in the past. He continues to have symptoms unchanged despite meclizine. Patient was not a TPA candidate secondary to unknown time of onset. He was admitted for further evaluation and treatment.  SUBJECTIVE No famliy is at the bedside.  Overall he feels his condition is gradually improving. He does complain of dizziness when he turns his head to the left, but not to the right. Dizziness is not continuous.  OBJECTIVE Most recent Vital Signs: Filed Vitals:   07/26/12 2000 07/27/12 0000 07/27/12 0400 07/27/12 0804  BP: 106/67 118/73 127/78 124/63  Pulse: 72 59 52 52  Temp: 98.7 F (37.1 C) 98.4 F (36.9 C) 98 F (36.7 C) 98.2 F (36.8 C)  TempSrc: Oral Oral Oral Oral  Resp: 16 16 15 16   Height:      Weight:   73.392 kg (161 lb 12.8 oz)   SpO2: 96% 98% 99% 98%   CBG (last 3)  No results found for this basename: GLUCAP:3 in the last 72 hours  IV Fluid Intake:     . sodium chloride 100 mL/hr at 07/26/12 1640    MEDICATIONS    . aspirin EC  81 mg Oral Daily  . latanoprost  1 drop Both Eyes QHS  . pneumococcal 23 valent vaccine  0.5 mL Intramuscular Tomorrow-1000  . sodium chloride  3 mL Intravenous Q12H   PRN:  meclizine, ondansetron (ZOFRAN) IV, ondansetron  Diet:  Cardiac thin liquids Activity:  Up with assistance DVT Prophylaxis:  SCDs   CLINICALLY SIGNIFICANT STUDIES Basic Metabolic Panel:  Lab [id number] 07/26/12 0535  NA 139 141  K 4.5 4.6  CL 107 105  CO2 23 25  GLUCOSE 93 123*  BUN 32* 23  CREATININE 2.07* 1.52*  CALCIUM 8.3* 9.5  MG -- --  PHOS -- --   Liver Function Tests:  Lab 07/25/12 1955  AST 17  ALT 8  ALKPHOS 78  BILITOT 0.4  PROT 7.5  ALBUMIN 3.8   CBC:  Lab 07/27/12 0505 07/26/12 0535 07/25/12 1955  WBC 4.4 7.6 --   NEUTROABS -- -- 8.3*  HGB 12.2* 14.0 --  HCT 37.8* 42.9 --  MCV 82.4 81.7 --  PLT 158 192 --   Coagulation: No results found for this basename: LABPROT:4,INR:4 in the last 168 hours Cardiac Enzymes: No results found for this basename: CKTOTAL:3,CKMB:3,CKMBINDEX:3,TROPONINI:3 in the last 168 hours Urinalysis:  Lab 07/25/12 [place] 1.020  PHURINE 7.0  GLUCOSEU NEGATIVE  HGBUR NEGATIVE  BILIRUBINUR NEGATIVE  KETONESUR NEGATIVE  PROTEINUR 100*  UROBILINOGEN 0.2  NITRITE NEGATIVE  LEUKOCYTESUR NEGATIVE   Lipid Panel    Component Value Date/Time   CHOL  Value: 149        ATP III CLASSIFICATION:  <200     mg/dL   Desirable  200-239  mg/dL   Borderline High  >=240    mg/dL   High        03/18/2010 1555   TRIG 93 03/18/2010 1555   HDL 38* 03/18/2010 1555   CHOLHDL 3.9 03/18/2010 1555   VLDL 19 03/18/2010 1555   LDLCALC  Value: 92        Total Cholesterol/HDL:CHD Risk Coronary Heart Disease Risk Table  Men   Women  1/2 Average Risk   3.4   3.3  Average Risk       5.0   4.4  2 X Average Risk   9.6   7.1  3 X Average Risk  23.4   11.0        Use the calculated Patient Ratio above and the CHD Risk Table to determine the patient's CHD Risk.        ATP III CLASSIFICATION (LDL):  <100     mg/dL   Optimal  100-129  mg/dL   Near or Above                    Optimal  130-159  mg/dL   Borderline  160-189  mg/dL   High  >190     mg/dL   Very High 03/18/2010 1555   HgbA1C  Lab Results  Component Value Date   HGBA1C  Value: 6.1 (NOTE)                                                                       According to the ADA Clinical Practice Recommendations for 2011, when HbA1c is used as a screening test:   >=6.5%   Diagnostic of Diabetes Mellitus           (if abnormal result  is confirmed)  5.7-6.4%   Increased risk of developing Diabetes Mellitus  References:Diagnosis and Classification of Diabetes Mellitus,Diabetes [id number] 1):S62-S69 and Standards of  Medical Care in         Diabetes - 2011,Diabetes Care,2011,34  (Suppl 1):S11-S61.* 03/18/2010    Urine Drug Screen:   No results found for this basename: labopia, cocainscrnur, labbenz, amphetmu, thcu, labbarb    Alcohol Level: No results found for this basename: ETH:2 in the last 168 hours  CT of the brain  07/25/2012  Volume loss and white matter changes.  No CT evidence of acute intracranial abnormality.  MRI of the brain  07/26/2012 Questionable tiny left frontal lobe infarct (series 3 image 18).  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  Prominent small vessel disease type changes.    MRA of the brain  See TCD  2D Echocardiogram    Carotid Doppler    TCD    CXR    EKG  normal sinus rhythm.   Therapy Recommendations home health PT 84/50  Physical Exam   Elderly elderly african [place] male who is not in distress.Awake alert. Afebrile. Head is nontraumatic. Neck is supple without bruit. Hearing is normal. Cardiac exam no murmur or gallop. Lungs are clear to auscultation. Distal pulses are well felt.  Neurological Exam ; Awake  Alert oriented x 3. Normal speech and language.eye movements full without nystagmus.mild saccadic dysmetria to the right. Face symmetric. Tongue midline. Normal strength, tone, reflexes and coordination. Normal sensation. Gait deferred.  ASSESSMENT Mr. ESCHER CERTO is a 77 y.o. male presenting with vertigo. 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 Current silent infarct felt to be thrombotic secondary to small vessel disease .  Vertigo likely BPPV, which he has had previously. Would  complete entire stroke work up given vascular risk factors. On no antiplatelets prior to admission. Now on aspirin 81 mg orally every day for secondary stroke prevention. Patient with resultant  positional vertigo, nausea. Gait not assessed.   Gout Hx Hypertension, hypotension last night  Glaucoma Arthritis Hyperlipidemia, LDL pending, on statin PTA, goal LDL < 100   Hospital day # 2  TREATMENT/PLAN  Continue aspirin 81 mg orally every day for secondary stroke prevention.  TCD  F/u lipids, A1c, 2D, carotids  PT to address vetigo  Burnetta Sabin, MSN, RN, ANVP-BC, ANP-BC, GNP-BC [facility] Pager: [phone] 07/27/2012 9:54 AM  I have personally obtained a history, examined the patient, evaluated imaging results, and formulated the assessment and plan of care. I agree with the above.    Antony Contras, MD Medical Director [facility] Pager: [phone] 07/27/2012 5:34 PM

## 2012-07-27 NOTE — Progress Notes (Signed)
Physical Therapy Treatment Patient Details Name: Trevor Murphy MRN: [id number] DOB: November 28, 1934 Today's Date: 07/27/2012 Time: [id number] PT Time Calculation (min): 20 min  PT Assessment / Plan / Recommendation Comments on Treatment Session  Pt s/p vertigo with symptoms suggesting right anterior canal BPPV and possible left horizontal canal BPPV.  Treated the right anterior canal with Epley maneuver and will return in pm to re assess and determine if we want to treat other canal.  MD:  Please give patient a prescription for Outpt PT for vestibular rehab prior to d/c so that patient can transition to Outpt therapy after a few weeks of HHPT f/u.      Follow Up Recommendations  Home health PT;Supervision - Intermittent (HHPT for vestibular rehab with transition to Outpt.  )         Barriers to Discharge  None      Equipment Recommendations  Rolling walker with 5" wheels    Recommendations for Other Services OT consult  Frequency Min 4X/week   Plan Discharge plan remains appropriate;Frequency remains appropriate    Precautions / Restrictions Precautions Precautions: Fall   Pertinent Vitals/Pain VSS, no pain    Mobility  Bed Mobility Bed Mobility: Rolling Right;Rolling Left;Right Sidelying to Sit;Left Sidelying to Sit;Sitting - Scoot to Edge of Bed;Sit to Sidelying Right;Sit to Sidelying Left Rolling Right: 6: Modified independent (Device/Increase time);With rail Rolling Left: 6: Modified independent (Device/Increase time);With rail Right Sidelying to Sit: 6: Modified independent (Device/Increase time) Left Sidelying to Sit: 6: Modified independent (Device/Increase time) Sitting - Scoot to Edge of Bed: 6: Modified independent (Device/Increase time) Sit to Supine: 6: Modified independent (Device/Increase time) Sit to Sidelying Right: 6: Modified independent (Device/Increase time) Sit to Sidelying Left: 6: Modified independent (Device/Increase time) Details for Bed Mobility Assistance:  Vertigo and nystagmus with rolling bil.  To left patient with left beating nystagmus and to right pt with right downbeating rotary nystagmus.  Decided to treat right anterior canal with Epley maneuver per symptoms/nystagmus.  Performed treatment for right anterior canal BPPV.  Symptoms once treatment completed were 6/10 on departure.   Transfers Transfers: Sit to Stand;Stand to Sit Sit to Stand: 4: Min guard;With upper extremity assist;From bed Stand to Sit: 4: Min guard;With upper extremity assist;To bed Details for Transfer Assistance: Some vertigo but Epley had just been performed. Ambulation/Gait Ambulation/Gait Assistance: Not tested (comment) Gait velocity: decreased Stairs: No Wheelchair Mobility Wheelchair Mobility: No    PT Goals Acute Rehab PT Goals PT Goal: Rolling Supine to Left Side - Progress: Progressing toward goal PT Goal: Supine/Side to Sit - Progress: Progressing toward goal PT Goal: Sit to Supine/Side - Progress: Progressing toward goal PT Goal: Sit to Stand - Progress: Progressing toward goal PT Goal: Stand to Sit - Progress: Progressing toward goal PT Goal: Ambulate - Progress: Progressing toward goal Additional Goals PT Goal: Additional Goal #1 - Progress: Progressing toward goal  Visit Information  Last PT Received On: 07/27/12 Assistance Needed: +1    Subjective Data  Subjective: Pt reports dizziness 10/10 on arrival.   Patient Stated Goal: Go home   Cognition  Overall Cognitive Status: Appears within functional limits for tasks assessed/performed Arousal/Alertness: Awake/alert Orientation Level: Appears intact for tasks assessed Behavior During Session: [facility] for tasks performed    Balance  Static Sitting Balance Static Sitting - Balance Support: No upper extremity supported;Feet supported Static Sitting - Level of Assistance: 6: Modified independent (Device/Increase time) Static Standing Balance Static Standing - Balance Support: Bilateral upper  extremity supported;During  functional activity Static Standing - Level of Assistance: 5: Stand by assistance  End of Session PT - End of Session Equipment Utilized During Treatment: Gait belt Activity Tolerance: Patient tolerated treatment well Patient left: in bed;with call bell/phone within reach;with bed alarm set Nurse Communication: Mobility status        INGOLD,Vihana Kydd 07/27/2012, 2:13 PM [facility] Acute Rehabilitation [phone] (pager)

## 2012-07-27 NOTE — Progress Notes (Signed)
Patient seen and examined. Agree with note by Dyanne Carrel, NP. Patient with a new frontal infarct. Stroke workup in progress. Also has acute on CKD stage III. Have held HCTZ and ACE-I and will recheck renal function in am.  Domingo Mend, MD [facility] Pager: [phone]

## 2012-07-28 DIAGNOSIS — I6350 Cerebral infarction due to unspecified occlusion or stenosis of unspecified cerebral artery: Principal | ICD-10-CM

## 2012-07-28 DIAGNOSIS — I639 Cerebral infarction, unspecified: Secondary | ICD-10-CM

## 2012-07-28 DIAGNOSIS — N183 Chronic kidney disease, stage 3 unspecified: Secondary | ICD-10-CM

## 2012-07-28 HISTORY — DX: Cerebral infarction, unspecified: I63.9

## 2012-07-28 LAB — BASIC METABOLIC PANEL
BUN: 26 mg/dL — ABNORMAL HIGH (ref 6–23)
CO2: 23 mEq/L (ref 19–32)
Calcium: 8.5 mg/dL (ref 8.4–10.5)
Chloride: 108 mEq/L (ref 96–112)
Creatinine, Ser: 1.62 mg/dL — ABNORMAL HIGH (ref 0.50–1.35)
GFR calc Af Amer: 46 mL/min — ABNORMAL LOW (ref >90)
GFR calc non Af Amer: 40 mL/min — ABNORMAL LOW (ref >90)
Glucose, Bld: 90 mg/dL (ref 70–99)
Potassium: 4.2 mEq/L (ref 3.5–5.1)
Sodium: 139 mEq/L (ref 135–145)

## 2012-07-28 LAB — CBC
HCT: 38.7 % — ABNORMAL LOW (ref 39.0–52.0)
Hemoglobin: 12.5 g/dL — ABNORMAL LOW (ref 13.0–17.0)
MCH: 26.5 pg (ref 26.0–34.0)
MCHC: 32.3 g/dL (ref 30.0–36.0)
MCV: 82 fL (ref 78.0–100.0)
Platelets: 149 10*3/uL — ABNORMAL LOW (ref 150–400)
RBC: 4.72 MIL/uL (ref 4.22–5.81)
RDW: 15.6 % — ABNORMAL HIGH (ref 11.5–15.5)
WBC: 6.4 10*3/uL (ref 4.0–10.5)

## 2012-07-28 MED ORDER — CARVEDILOL 6.25 MG PO TABS
6.2500 mg | ORAL_TABLET | Freq: Two times a day (BID) | ORAL | Status: DC
  Administered 2012-07-28: 6.25 mg via ORAL
  Filled 2012-07-28 (×3): qty 1

## 2012-07-28 MED ORDER — MECLIZINE HCL 25 MG PO TABS: 25.0000 mg | ORAL_TABLET | Freq: Three times a day (TID) | ORAL | Status: DC

## 2012-07-28 MED ORDER — CARVEDILOL 6.25 MG PO TABS: 6.2500 mg | ORAL_TABLET | Freq: Two times a day (BID) | ORAL | Status: DC

## 2012-07-28 MED ORDER — ASPIRIN 81 MG PO TBEC: 81.0000 mg | DELAYED_RELEASE_TABLET | Freq: Every day | ORAL | Status: DC

## 2012-07-28 MED ORDER — CLONIDINE HCL 0.2 MG PO TABS: 0.2000 mg | ORAL_TABLET | Freq: Two times a day (BID) | ORAL | Status: DC

## 2012-07-28 MED ORDER — ACETAMINOPHEN 325 MG PO TABS
650.0000 mg | ORAL_TABLET | ORAL | Status: DC | PRN
  Administered 2012-07-28: 650 mg via ORAL

## 2012-07-28 MED ORDER — ACETAMINOPHEN 325 MG PO TABS
650.0000 mg | ORAL_TABLET | Freq: Four times a day (QID) | ORAL | Status: DC | PRN
  Administered 2012-07-28: 650 mg via ORAL
  Filled 2012-07-28: qty 2

## 2012-07-28 NOTE — Discharge Summary (Signed)
Physician Discharge Summary  Trevor Murphy [id number] DOB: February 23, 1935 DOA: 07/25/2012  PCP: Janne Napoleon, NP  Admit date: 07/25/2012 Discharge date: 07/28/2012  Time spent: 40 minutes  Recommendations for Outpatient Follow-up:  1. Follow up with PCP 1 week. Recommend BP evaluation as Meds adjusted. Recommend BMET to evaluate renal function  Discharge Diagnoses:  Principal Problem:  *Vertigo Active Problems:  HTN (hypertension)  CKD (chronic kidney disease) stage 3, GFR 30-59 ml/min  N&V (nausea and vomiting)  Acute on chronic renal failure  CVA (cerebral infarction)   Discharge Condition: stable for discharge to home with wife  Diet recommendation: heart healthy  Filed Weights   07/26/12 0754 07/27/12 0400 07/28/12 0500  Weight: 68.176 kg (150 lb 4.8 oz) 73.392 kg (161 lb 12.8 oz) 77.7 kg (171 lb 4.8 oz)    History of present illness:   Very pleasant 77 yo man presented on 07/26/12 with cc of positional vertigo for most of the day. Pt reported that that it is better when laying still and worse with movement. Reportedly this had happened to him once before in march of this year and it took days to go away. On day of admission, vertigo worsened and pt developed with n/v nonbloody. No fevers. No rashes. No focal neuro deficits other than the dizziness. Received several doses of meclizine and improved but dizziness still present. N/v improved as well. No abd pain. No cp, no sob. Pt admitted to medicine service   Hospital Course:  Vertigo: Likely BPPV. Pt admitted to tele. Provided with prn meclizine with only slight improvement. Meclizine changed to scheduled and vertigo much improved at discharge. In addition seen by PT for vestibular therapy and symptoms improved. who recommend HH vestibular rehab. Pt will be discharged with [facility] vestibular rehab. Will also continue scheduled meclizine for 6 more days. Recommend follow up with PCP 1 week.  CVA:  Head CT neg.  MRI with questionable  left frontal tiny infarct and remote small infarcts involving portions of the cerebellum bilaterally, right paracentral pontine region, left basal ganglia, left thalamus and centrum semiovale bilaterally. HDL 32 otherwise lipids WNL, A1c 6.0, carotid doppler with no evidence of significant internal carotid artery stenosis. Echo with EF 555 and moderate LVH. Will continue asa and statin.  Active Problems:   HTN (hypertension) : Home BB at half dose during this hospitalization. Home clonidine initially started and discontinued on 12/17. Also held home ace and HCTZ to allow for permissive HTN and renal function. At discharge BP with fair control and HR 60's on the BB only. Recommend close OP follow up with PCP to evaluated need for further BP control.    CKD (chronic kidney disease) : Nephrotoxins held. Gently iv hydration. At discharge creatine trending downward.  chart review indicates baseline range 1.4-1.6.  Continue to hold home ace and HCTZ at discharge. See #3.   N&V (nausea and vomiting) Pt had no vomiting during this hospitalization and nausea resolved at discharge.    Procedures:  none  Consultations:  neuro  Discharge Exam: Filed Vitals:   07/28/12 0500 07/28/12 0510 07/28/12 0552 07/28/12 0732  BP: 187/87 178/82 168/77 172/92  Pulse: 60   62  Temp: 98.6 F (37 C)     TempSrc: Oral     Resp: 20     Height:      Weight: 77.7 kg (171 lb 4.8 oz)     SpO2: 98%       General: awake alert oriented x3 NAD Cardiovascular: RRR  No MGR No LEE PPP Respiratory: normal effort BSCTAB No wheeze/rhonchi  Discharge Instructions      Discharge Orders    Future Orders Please Complete By Expires   Diet - low sodium heart healthy      Increase activity slowly      Call MD for:  persistant nausea and vomiting      Call MD for:  persistant dizziness or light-headedness      Discharge instructions      Comments:   Recommend follow up with PCP 1 week. Recommend bmet for renal function  and BP eval.       Medication List     As of 07/28/2012 11:50 AM    STOP taking these medications         cloNIDine 0.2 MG tablet   Commonly known as: CATAPRES      GOODY HEADACHE PO      lisinopril-hydrochlorothiazide 20-25 MG per tablet   Commonly known as: PRINZIDE,ZESTORETIC      TAKE these medications         allopurinol 100 MG tablet   Commonly known as: ZYLOPRIM   Take 100 mg by mouth daily.      aspirin 81 MG EC tablet   Take 1 tablet (81 mg total) by mouth daily.      bismuth subsalicylate [id number] suspension   Commonly known as: PEPTO BISMOL   Take 15 mLs by mouth every 6 (six) hours as needed. For upset stomach      carvedilol 6.25 MG tablet   Commonly known as: COREG   Take 1 tablet (6.25 mg total) by mouth 2 (two) times daily with a meal.      colchicine 0.6 MG tablet   Take 1 tablet (0.6 mg total) by mouth 2 (two) times daily.      latanoprost 0.005 % ophthalmic solution   Commonly known as: XALATAN   Place 1 drop into both eyes at bedtime.      meclizine 25 MG tablet   Commonly known as: ANTIVERT   Take 1 tablet (25 mg total) by mouth 3 (three) times daily.      rosuvastatin 5 MG tablet   Commonly known as: CRESTOR   Take 5 mg by mouth daily.      traMADol 50 MG tablet   Commonly known as: ULTRAM   Take 50 mg by mouth every 4 (four) hours as needed.           The results of significant diagnostics from this hospitalization (including imaging, microbiology, ancillary and laboratory) are listed below for reference.    Significant Diagnostic Studies: Ct Head Wo Contrast  07/25/2012  *RADIOLOGY REPORT*  Clinical Data: Headache, hypertension.  CT HEAD WITHOUT CONTRAST  Technique:  Contiguous axial images were obtained from the base of the skull through the vertex without contrast.  Comparison: 10/27/2011  Findings: Prominence of the sulci, cisterns, and ventricles, in keeping with volume loss. There are subcortical and periventricular  white matter hypodensities, a nonspecific finding most often seen with chronic microangiopathic changes.  There is no evidence for acute hemorrhage, overt hydrocephalus, mass lesion, or abnormal extra-axial fluid collection.  No definite CT evidence for acute cortical based (large artery) infarction. The visualized paranasal sinuses and mastoid air cells are predominately clear.  IMPRESSION: Volume loss and white matter changes.  No CT evidence of acute intracranial abnormality.   Original Report Authenticated By: Carlos Levering, M.D.    Mr Jeri Cos Wo  Contrast  07/26/2012  *RADIOLOGY REPORT*  Clinical Data: Vertigo.  MRI HEAD WITHOUT AND WITH CONTRAST  Technique:  Multiplanar, multiecho pulse sequences of the brain and surrounding structures were obtained according to standard protocol without and with intravenous contrast  Contrast:  Comparison: 07/25/2012 head CT.  No comparison MR.  Findings: Questionable tiny left frontal lobe infarct (series 3 image 18).  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  Prominent small vessel disease type changes.  No intracranial hemorrhage.  No intracranial mass or abnormal enhancement.  Global atrophy without hydrocephalus.  Cervical spondylotic changes.  Major intracranial vascular structures are patent.  Paranasal sinus mucosal thickening.  Exophthalmos.  IMPRESSION: Questionable tiny left frontal lobe infarct (series 3 image 18).  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  Prominent small vessel disease type changes.   Original Report Authenticated By: Genia Del, M.D.     Microbiology: No results found for this or any previous visit (from the past 240 hour(s)).   Labs: Basic Metabolic Panel:  Lab  [id number] 07/27/12 0505 07/26/12 0535 07/25/12 1955  NA 139 139 141 138  K 4.2 4.5 4.6 4.1  CL 108 107 105 101  CO2 23 23 25 23   GLUCOSE 90 93 123* 181*  BUN 26* 32* 23 27*  CREATININE 1.62* 2.07* 1.52* 1.62*  CALCIUM 8.5 8.3* 9.5 9.2  MG -- -- -- --  PHOS -- -- -- --   Liver Function Tests:  Lab 07/25/12 1955  AST 17  ALT 8  ALKPHOS 78  BILITOT 0.4  PROT 7.5  ALBUMIN 3.8   No results found for this basename: LIPASE:5,AMYLASE:5 in the last 168 hours No results found for this basename: AMMONIA:5 in the last 168 hours CBC:  Lab 07/28/12 0542 07/27/12 0505 07/26/12 0535 07/25/12 1955  WBC 6.4 4.4 7.6 9.6  NEUTROABS -- -- -- 8.3*  HGB 12.5* 12.2* 14.0 14.0  HCT 38.7* 37.8* 42.9 42.5  MCV 82.0 82.4 81.7 82.2  PLT 149* 158 192 154   Cardiac Enzymes: No results found for this basename: CKTOTAL:5,CKMB:5,CKMBINDEX:5,TROPONINI:5 in the last 168 hours BNP: BNP (last 3 results) No results found for this basename: PROBNP:3 in the last 8760 hours CBG: No results found for this basename: GLUCAP:5 in the last 168 hours     Signed:  Radene Gunning  [facility] 07/28/2012, 11:50 AM

## 2012-07-28 NOTE — Progress Notes (Signed)
Physical Therapy Treatment Patient Details Name: Trevor Murphy MRN: [id number] DOB: 07-Aug-1935 Today's Date: 07/28/2012 Time: [id number] PT Time Calculation (min): 15 min  PT Assessment / Plan / Recommendation Comments on Treatment Session  Pt s/p vertigo with symptoms on arrival 10/10 for dizziness.  Pt stated that he was clear after treatment yesterday and sometime in the night the symptoms returned.  Treated patient for left horizontal canal BPPV with immediate success.  Pt reports that he had 0/10 dizziness after treatment.  Assisted pt to chair so he could sit up awhile and to allow crystals to hopefully settle.  MD:  Please give patient a prescription for Outpt PT for vestibular rehab prior to d/c home.  Thanks.    Follow Up Recommendations  Home health PT;Supervision - Intermittent (HHPT for vestibular rehab with transition to [facility].  )                 Equipment Recommendations  Rolling walker with 5" wheels    Recommendations for Other Services OT consult  Frequency Min 4X/week   Plan Discharge plan remains appropriate;Frequency remains appropriate    Precautions / Restrictions Precautions Precautions: Fall Restrictions Weight Bearing Restrictions: No   Pertinent Vitals/Pain VSS, No pain    Mobility  Bed Mobility Bed Mobility: Rolling Right;Rolling Left;Right Sidelying to Sit;Left Sidelying to Sit;Supine to Sit;Sit to Supine Rolling Right: 6: Modified independent (Device/Increase time) Rolling Left: 6: Modified independent (Device/Increase time) Right Sidelying to Sit: 6: Modified independent (Device/Increase time) Left Sidelying to Sit: 6: Modified independent (Device/Increase time) Supine to Sit: 6: Modified independent (Device/Increase time) Sit to Supine: 6: Modified independent (Device/Increase time) Details for Bed Mobility Assistance: Upon testing, noted that pt with + supine head roll left.  Performed BBQ roll for left BPPV again.  After treatment, pt reports  that spinning at 0/10 with no symptoms.  Educated patient the he must not extend neck for 24 hours and actually go pt up to the chair so that hopefully the crystals would settle and not creep back into the canal.  Pt understands to sleep with 2 pillows and not allow head to drop back.   Transfers Transfers: Sit to Stand;Stand to Sit Sit to Stand: 4: Min guard;With upper extremity assist;From bed Stand to Sit: 4: Min guard;With upper extremity assist;To bed Ambulation/Gait Ambulation/Gait Assistance: Not tested (comment) Stairs: No Wheelchair Mobility Wheelchair Mobility: No     PT Goals Acute Rehab PT Goals PT Goal: Rolling Supine to Left Side - Progress: Progressing toward goal PT Goal: Supine/Side to Sit - Progress: Met PT Goal: Sit to Supine/Side - Progress: Met PT Goal: Sit to Stand - Progress: Progressing toward goal PT Goal: Stand to Sit - Progress: Progressing toward goal Additional Goals PT Goal: Additional Goal #1 - Progress: Met  Visit Information  Last PT Received On: 07/28/12 Assistance Needed: +1    Subjective Data  Subjective: Pt reports that something happened in middle of night and he is 10/10 dizzy.   Cognition  Overall Cognitive Status: Appears within functional limits for tasks assessed/performed Arousal/Alertness: Awake/alert Orientation Level: Appears intact for tasks assessed Behavior During Session: [facility] for tasks performed    Balance  Static Sitting Balance Static Sitting - Balance Support: No upper extremity supported;Feet supported Static Sitting - Level of Assistance: 6: Modified independent (Device/Increase time) Static Standing Balance Static Standing - Balance Support: No upper extremity supported;During functional activity Static Standing - Level of Assistance: 5: Stand by assistance  End of Session PT -  End of Session Equipment Utilized During Treatment: Gait belt Activity Tolerance: Patient tolerated treatment well Patient left: in  chair;with call bell/phone within reach Nurse Communication: Mobility status       INGOLD,Donaldo Teegarden 07/28/2012, 9:38 AM  [facility] [phone] (pager)

## 2012-07-28 NOTE — Progress Notes (Signed)
Pt complaining of headache and left knee hurting, 0400 am neuro checks done and there were no neuro symptoms. Pt B/P 178/82 manually, MD on call notified gave order for Tylenol prn for pain and blood pressure medication for morning medications. Will continue to monitor.

## 2012-07-28 NOTE — Progress Notes (Signed)
D/c orders received, IR removed with gauze on, pt remains in stable condition; pt meds and instructions reviewed and given to pt; reviewed stroke information with pt; pt d/c to home

## 2012-07-28 NOTE — Progress Notes (Signed)
Stroke Team Progress Note  HISTORY Trevor Murphy is a 77 y.o. male with a history of vertigo present on awakening 07/26/2012. It is present even at rest. He denies tinnitus or hearing change. He has had another episode similar in the past. He continues to have symptoms unchanged despite meclizine. Patient was not a TPA candidate secondary to unknown time of onset. He was admitted for further evaluation and treatment.  SUBJECTIVE Patient dressed and ready to go home.  OBJECTIVE Most recent Vital Signs: Filed Vitals:   07/28/12 0500 07/28/12 0510 07/28/12 0552 07/28/12 0732  BP: 187/87 178/82 168/77 172/92  Pulse: 60   62  Temp: 98.6 F (37 C)     TempSrc: Oral     Resp: 20     Height:      Weight: 77.7 kg (171 lb 4.8 oz)     SpO2: 98%      CBG (last 3)  No results found for this basename: GLUCAP:3 in the last 72 hours  IV Fluid Intake:      . sodium chloride 100 mL/hr at 07/27/12 1710    MEDICATIONS     . aspirin EC  81 mg Oral Daily  . atorvastatin  10 mg Oral q1800  . carvedilol  6.25 mg Oral BID WC  . latanoprost  1 drop Both Eyes QHS  . meclizine  25 mg Oral TID  . pneumococcal 23 valent vaccine  0.5 mL Intramuscular Tomorrow-1000  . sodium chloride  3 mL Intravenous Q12H   PRN:  acetaminophen, ondansetron (ZOFRAN) IV, ondansetron  Diet:  Cardiac thin liquids Activity:  Up with assistance DVT Prophylaxis:  SCDs   CLINICALLY SIGNIFICANT STUDIES Basic Metabolic Panel:   Lab [id number] 07/27/12 0505  NA 139 139  K 4.2 4.5  CL 108 107  CO2 23 23  GLUCOSE 90 93  BUN 26* 32*  CREATININE 1.62* 2.07*  CALCIUM 8.5 8.3*  MG -- --  PHOS -- --   Liver Function Tests:   Lab 07/25/12 1955  AST 17  ALT 8  ALKPHOS 78  BILITOT 0.4  PROT 7.5  ALBUMIN 3.8   CBC:   Lab 07/28/12 0542 07/27/12 0505 07/25/12 1955  WBC 6.4 4.4 --  NEUTROABS -- -- 8.3*  HGB 12.5* 12.2* --  HCT 38.7* 37.8* --  MCV 82.0 82.4 --  PLT 149* 158 --   Coagulation: No results  found for this basename: LABPROT:4,INR:4 in the last 168 hours Cardiac Enzymes: No results found for this basename: CKTOTAL:3,CKMB:3,CKMBINDEX:3,TROPONINI:3 in the last 168 hours Urinalysis:   Lab 07/25/12 1951  COLORURINE YELLOW  LABSPEC 1.020  PHURINE 7.0  GLUCOSEU NEGATIVE  HGBUR NEGATIVE  BILIRUBINUR NEGATIVE  KETONESUR NEGATIVE  PROTEINUR 100*  UROBILINOGEN 0.2  NITRITE NEGATIVE  LEUKOCYTESUR NEGATIVE   Lipid Panel    Component Value Date/Time   CHOL 124 07/27/2012 0505   TRIG 94 07/27/2012 0505   HDL 32* 07/27/2012 0505   CHOLHDL 3.9 07/27/2012 0505   VLDL 19 07/27/2012 0505   LDLCALC 73 07/27/2012 0505   HgbA1C  Lab Results  Component Value Date   HGBA1C 6.0* 07/27/2012    Urine Drug Screen:   No results found for this basename: labopia,  cocainscrnur,  labbenz,  amphetmu,  thcu,  labbarb    Alcohol Level: No results found for this basename: ETH:2 in the last 168 hours  CT of the brain  07/25/2012  Volume loss and white matter changes.  No CT evidence of  acute intracranial abnormality.  MRI of the brain  07/26/2012 Questionable tiny left frontal lobe infarct (series 3 image 18).  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  Prominent small vessel disease type changes.    MRA of the brain  See TCD  2D Echocardiogram  EF 55-60% with no source of embolus.   Carotid Doppler  No evidence of hemodynamically significant internal carotid artery stenosis. Vertebral artery flow is antegrade.   TCD    CXR    EKG  normal sinus rhythm.   Therapy Recommendations home health PT followed by OP vestibualr rehab following, RW w/ 5" wheels   Physical Exam   Elderly elderly african [place] male who is not in distress.Awake alert. Afebrile. Head is nontraumatic. Neck is supple without bruit. Hearing is normal. Cardiac exam no murmur or gallop.  Lungs are clear to auscultation. Distal pulses are well felt.  Neurological Exam ; Awake  Alert oriented x 3. Normal speech and language.eye movements full without nystagmus.mild saccadic dysmetria to the right. Face symmetric. Tongue midline. Normal strength, tone, reflexes and coordination. Normal sensation. Gait deferred.   ASSESSMENT Trevor Murphy is a 77 y.o. male presenting with vertigo. 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 Current silent infarct felt to be thrombotic secondary to small vessel disease .  Vertigo likely BPPV, which he has had previously. Stroke workup complete. On no antiplatelets prior to admission. Now on aspirin 81 mg orally every day for secondary stroke prevention. Patient with resultant positional vertigo, nausea.    Gout Hx Hypertension, hypotension last night  Glaucoma Arthritis Hyperlipidemia, LDL pending, on statin PTA, goal LDL < 100  Hospital day # 3  TREATMENT/PLAN  Continue aspirin 81 mg orally every day for secondary stroke prevention.  TCD. Dr Leonie Man will read today and follow up as an OP  HH PT followed by OP PT to address vetigo with vestibular PT Stroke Service will sign off. Follow up with Dr. Leonie Man, [facility], in 2 months.  Burnetta Sabin, MSN, RN, ANVP-BC, ANP-BC, [facility] Pager: [phone] 07/28/2012 9:19 AM  I have personally obtained a history, examined the patient, evaluated imaging results, and formulated the assessment and plan of care. I agree with the above.    Antony Contras, MD Medical Director [facility] Pager: [phone] 07/28/2012 9:19 AM

## 2012-07-28 NOTE — Discharge Summary (Signed)
Patient seen and examined. Agree with note by Dyanne Carrel, NP. Patient will be discharged home today. Has been started on coreg for HTN and will need a follow up in about 2 weeks to have his BP checked and dose adjusted as necessary. We have arranged for HHPT.  Domingo Mend, MD [facility] Pager: [phone]

## 2012-11-27 ENCOUNTER — Encounter (HOSPITAL_COMMUNITY): Payer: Self-pay | Admitting: Emergency Medicine

## 2012-11-27 ENCOUNTER — Emergency Department (HOSPITAL_COMMUNITY): Payer: Medicare Other

## 2012-11-27 ENCOUNTER — Emergency Department (HOSPITAL_COMMUNITY)
Admission: EM | Admit: 2012-11-27 | Discharge: 2012-11-27 | Disposition: A | Discharge status: Home / Self Care | Payer: Medicare Other | Attending: Emergency Medicine | Admitting: Emergency Medicine

## 2012-11-27 DIAGNOSIS — H539 Unspecified visual disturbance: Secondary | ICD-10-CM | POA: Insufficient documentation

## 2012-11-27 DIAGNOSIS — I10 Essential (primary) hypertension: Secondary | ICD-10-CM

## 2012-11-27 DIAGNOSIS — Z8719 Personal history of other diseases of the digestive system: Secondary | ICD-10-CM | POA: Insufficient documentation

## 2012-11-27 DIAGNOSIS — M109 Gout, unspecified: Secondary | ICD-10-CM | POA: Insufficient documentation

## 2012-11-27 DIAGNOSIS — N189 Chronic kidney disease, unspecified: Secondary | ICD-10-CM | POA: Insufficient documentation

## 2012-11-27 DIAGNOSIS — Z8739 Personal history of other diseases of the musculoskeletal system and connective tissue: Secondary | ICD-10-CM | POA: Insufficient documentation

## 2012-11-27 DIAGNOSIS — Z7982 Long term (current) use of aspirin: Secondary | ICD-10-CM | POA: Insufficient documentation

## 2012-11-27 DIAGNOSIS — I129 Hypertensive chronic kidney disease with stage 1 through stage 4 chronic kidney disease, or unspecified chronic kidney disease: Secondary | ICD-10-CM | POA: Insufficient documentation

## 2012-11-27 DIAGNOSIS — Z79899 Other long term (current) drug therapy: Secondary | ICD-10-CM | POA: Insufficient documentation

## 2012-11-27 DIAGNOSIS — Z8669 Personal history of other diseases of the nervous system and sense organs: Secondary | ICD-10-CM | POA: Insufficient documentation

## 2012-11-27 DIAGNOSIS — R42 Dizziness and giddiness: Secondary | ICD-10-CM

## 2012-11-27 LAB — COMPREHENSIVE METABOLIC PANEL
ALT: 11 U/L (ref 0–53)
AST: 19 U/L (ref 0–37)
Albumin: 4 g/dL (ref 3.5–5.2)
Alkaline Phosphatase: 108 U/L (ref 39–117)
BUN: 21 mg/dL (ref 6–23)
CO2: 24 mEq/L (ref 19–32)
Calcium: 9.3 mg/dL (ref 8.4–10.5)
Chloride: 101 mEq/L (ref 96–112)
Creatinine, Ser: 1.63 mg/dL — ABNORMAL HIGH (ref 0.50–1.35)
GFR calc Af Amer: 46 mL/min — ABNORMAL LOW (ref >90)
GFR calc non Af Amer: 40 mL/min — ABNORMAL LOW (ref >90)
Glucose, Bld: 123 mg/dL — ABNORMAL HIGH (ref 70–99)
Potassium: 4 mEq/L (ref 3.5–5.1)
Sodium: 135 mEq/L (ref 135–145)
Total Bilirubin: 0.4 mg/dL (ref 0.3–1.2)
Total Protein: 8.1 g/dL (ref 6.0–8.3)

## 2012-11-27 LAB — CBC WITH DIFFERENTIAL/PLATELET
Basophils Absolute: 0 10*3/uL (ref 0.0–0.1)
Basophils Relative: 0 % (ref 0–1)
Eosinophils Absolute: 0.1 10*3/uL (ref 0.0–0.7)
Eosinophils Relative: 2 % (ref 0–5)
HCT: 42.8 % (ref 39.0–52.0)
Hemoglobin: 14.9 g/dL (ref 13.0–17.0)
Lymphocytes Relative: 15 % (ref 12–46)
Lymphs Abs: 0.9 10*3/uL (ref 0.7–4.0)
MCH: 27.5 pg (ref 26.0–34.0)
MCHC: 34.8 g/dL (ref 30.0–36.0)
MCV: 79 fL (ref 78.0–100.0)
Monocytes Absolute: 0.3 10*3/uL (ref 0.1–1.0)
Monocytes Relative: 5 % (ref 3–12)
Neutro Abs: 4.8 10*3/uL (ref 1.7–7.7)
Neutrophils Relative %: 79 % — ABNORMAL HIGH (ref 43–77)
Platelets: 152 10*3/uL (ref 150–400)
RBC: 5.42 MIL/uL (ref 4.22–5.81)
RDW: 16.1 % — ABNORMAL HIGH (ref 11.5–15.5)
WBC: 6.2 10*3/uL (ref 4.0–10.5)

## 2012-11-27 LAB — TROPONIN I: Troponin I: <0.3 ng/mL (ref <0.30)

## 2012-11-27 MED ORDER — METOCLOPRAMIDE HCL 5 MG/ML IJ SOLN
5.0000 mg | Freq: Once | INTRAMUSCULAR | Status: AC
  Administered 2012-11-27: 5 mg via INTRAVENOUS
  Filled 2012-11-27: qty 2

## 2012-11-27 MED ORDER — CLONIDINE HCL 0.1 MG PO TABS
0.1000 mg | ORAL_TABLET | Freq: Once | ORAL | Status: AC
  Administered 2012-11-27: 0.1 mg via ORAL
  Filled 2012-11-27: qty 1

## 2012-11-27 MED ORDER — CLONIDINE HCL 0.1 MG PO TABS: 0.1000 mg | ORAL_TABLET | Freq: Every day | ORAL | Status: DC | PRN

## 2012-11-27 MED ORDER — DIPHENHYDRAMINE HCL 50 MG/ML IJ SOLN
12.5000 mg | Freq: Once | INTRAMUSCULAR | Status: AC
  Administered 2012-11-27: 12.5 mg via INTRAVENOUS
  Filled 2012-11-27: qty 1

## 2012-11-27 NOTE — ED Notes (Signed)
PT. REPORTS ELEVATED BLOOD PRESSURE TODAY WITH SLIGHT HEADACHE .

## 2012-11-27 NOTE — ED Notes (Signed)
Pt states he last took his Lopressor this morning. States he also took tylenol before leaving to come to the hospital because of his HA.

## 2012-11-27 NOTE — ED Provider Notes (Signed)
History     CSN: [id number]  Arrival date & time 11/27/12  1912   First MD Initiated Contact with Patient 11/27/12 1930      Chief Complaint  Patient presents with  . Hypertension    (Consider location/radiation/quality/duration/timing/severity/associated sxs/prior treatment) HPI  Patient states about 3 PM he was in his bed and he started getting a headache behind his left eye. He states when he turned his head his vision would go black in his right eye. He states he cannot see out of his left eye normally. He states he's had similar symptoms before his blood pressure was high. He was told he had a possible mini stroke 2 or 3 months ago. Patient states he's been evaulated with  Doppler ultrasounds of his neck. He denies that headache being throbbing and states it's constant. He denies nausea, or vomiting, he also denies chest pain or shortness of breath. He states he was a little bit clammy earlier in the evening. He denies numbness or weakness in his extremities or in his face. He states he did feel a little bit off balance today. He states he was seen by his PCP Wednesday and states his visit was good however he was started on a new blood pressure medicine, tribenzor, and he was given samples to take.   PCP [facility]  Past Medical History  Diagnosis Date  . Poor circulation   . Hernia     umbilical  . Gout   . Glaucoma   . Hypertension     does not see a cardiologist  . Arthritis   . Chronic kidney disease   . Headache     " WHEN MY BLOOD PRESSURE IS UP"    Past Surgical History  Procedure Laterality Date  . Hernia repair    . Ventral hernia repair  03/07/2012    Procedure: LAPAROSCOPIC VENTRAL HERNIA;  Surgeon: Gwenyth Ober, MD;  Location: [facility];  Service: General;  Laterality: N/A;  . Appendectomy      No family history on file.  History  Substance Use Topics  . Smoking status: Never Smoker   . Smokeless tobacco: Never Used  . Alcohol Use: 0.6  oz/week    1 Cans of beer per week     Comment: occassional  Lives at home Lives with spouse    Review of Systems  All other systems reviewed and are negative.    Allergies  Review of patient's allergies indicates no known allergies.  Home Medications     CONTINUE these medications which have NOT CHANGED   Details  acetaminophen (TYLENOL) 500 MG tablet Take 1,000 mg by mouth every 6 (six) hours as needed for pain., Until Discontinued, Historical Med    allopurinol (ZYLOPRIM) 100 MG tablet Take 100 mg by mouth daily. , Until Discontinued, Historical Med    aspirin EC 81 MG EC tablet Take 1 tablet (81 mg total) by mouth daily., Starting 07/28/2012, Until Discontinued, OTC    rosuvastatin (CRESTOR) 5 MG tablet Take 5 mg by mouth daily. , Until Discontinued, Historical Med    traMADol (ULTRAM) 50 MG tablet Take 50 mg by mouth every 8 (eight) hours as needed for pain. , Until Discontinued, Historical Med    Tribenzor    BP 132/83  Pulse 57  Temp(Src) 98 F (36.7 C) (Oral)  Resp 20  SpO2 98%  Vital signs normal    Physical Exam  Nursing note and vitals reviewed. Constitutional: He is oriented  to person, place, and time. He appears well-developed and well-nourished.  Non-toxic appearance. He does not appear ill. No distress.  HENT:  Head: Normocephalic and atraumatic.  Right Ear: External ear normal.  Left Ear: External ear normal.  Nose: Nose normal. No mucosal edema or rhinorrhea.  Mouth/Throat: Oropharynx is clear and moist and mucous membranes are normal. No dental abscesses or edematous.  Eyes: Conjunctivae and EOM are normal. Pupils are equal, round, and reactive to light.  Neck: Normal range of motion and full passive range of motion without pain. Neck supple.  Cardiovascular: Normal rate, regular rhythm and normal heart sounds.  Exam reveals no gallop and no friction rub.   No murmur heard. Pulmonary/Chest: Effort normal and breath sounds normal. No  respiratory distress. He has no wheezes. He has no rhonchi. He has no rales. He exhibits no tenderness and no crepitus.  Abdominal: Soft. Normal appearance and bowel sounds are normal. He exhibits no distension. There is no tenderness. There is no rebound and no guarding.  Musculoskeletal: Normal range of motion. He exhibits no edema and no tenderness.  Moves all extremities well.   Neurological: He is alert and oriented to person, place, and time. He has normal strength. No cranial nerve deficit.  Equal grips, symmetrical face, no pronator drift, no motor weakness in upper or lower extremities  Skin: Skin is warm, dry and intact. No rash noted. No erythema. No pallor.  Psychiatric: He has a normal mood and affect. His speech is normal and behavior is normal. His mood appears not anxious.    ED Course  Procedures (including critical care time)  Medications  cloNIDine (CATAPRES) tablet 0.1 mg (0.1 mg Oral Given 11/27/12 2013)  metoCLOPramide (REGLAN) injection 5 mg (5 mg Intravenous Given 11/27/12 2022)  diphenhydrAMINE (BENADRYL) injection 12.5 mg (12.5 mg Intravenous Given 11/27/12 2022)   Recheck at discharge, his BP is now 131/67, HR is 53. Pt states his headache is gone, dizziness is gone and he feels ready to go home.   Results for orders placed during the hospital encounter of 11/27/12  CBC WITH DIFFERENTIAL      Result Value Range   WBC 6.2  4.0 - 10.5 K/uL   RBC 5.42  4.22 - 5.81 MIL/uL   Hemoglobin 14.9  13.0 - 17.0 g/dL   HCT 42.8  39.0 - 52.0 %   MCV 79.0  78.0 - 100.0 fL   MCH 27.5  26.0 - 34.0 pg   MCHC 34.8  30.0 - 36.0 g/dL   RDW 16.1 (*) 11.5 - 15.5 %   Platelets 152  150 - 400 K/uL   Neutrophils Relative 79 (*) 43 - 77 %   Neutro Abs 4.8  1.7 - 7.7 K/uL   Lymphocytes Relative 15  12 - 46 %   Lymphs Abs 0.9  0.7 - 4.0 K/uL   Monocytes Relative 5  3 - 12 %   Monocytes Absolute 0.3  0.1 - 1.0 K/uL   Eosinophils Relative 2  0 - 5 %   Eosinophils Absolute 0.1  0.0 -  0.7 K/uL   Basophils Relative 0  0 - 1 %   Basophils Absolute 0.0  0.0 - 0.1 K/uL  COMPREHENSIVE METABOLIC PANEL      Result Value Range   Sodium 135  135 - 145 mEq/L   Potassium 4.0  3.5 - 5.1 mEq/L   Chloride 101  96 - 112 mEq/L   CO2 24  19 - 32 mEq/L  Glucose, Bld 123 (*) 70 - 99 mg/dL   BUN 21  6 - 23 mg/dL   Creatinine, Ser 1.63 (*) 0.50 - 1.35 mg/dL   Calcium 9.3  8.4 - 10.5 mg/dL   Total Protein 8.1  6.0 - 8.3 g/dL   Albumin 4.0  3.5 - 5.2 g/dL   AST 19  0 - 37 U/L   ALT 11  0 - 53 U/L   Alkaline Phosphatase 108  39 - 117 U/L   Total Bilirubin 0.4  0.3 - 1.2 mg/dL   GFR calc non Af Amer 40 (*) >90 mL/min   GFR calc Af Amer 46 (*) >90 mL/min  TROPONIN I      Result Value Range   Troponin I <0.30  <0.30 ng/mL   Laboratory interpretation all normal except stable renal insuffic   Ct Head Wo Contrast  11/27/2012  *RADIOLOGY REPORT*  Clinical Data: Hypertension, dizziness  CT HEAD WITHOUT CONTRAST  Technique:  Contiguous axial images were obtained from the base of the skull through the vertex without contrast.  Comparison: 07/26/2012 MRI  Findings: Prominence of the sulci, cisterns, and ventricles, in keeping with volume loss. There are subcortical and periventricular white matter hypodensities, a nonspecific finding most often seen with chronic microangiopathic changes.  There is no evidence for acute hemorrhage, overt hydrocephalus, mass lesion, or abnormal extra-axial fluid collection.  No definite CT evidence for acute cortical based (large artery) infarction. Punctate hypodensity right thalamus and left lentiform nucleus, unchanged, favor sequelae of prior remote lacunar infarctions.  The visualized paranasal sinuses and mastoid air cells are predominately clear.  IMPRESSION: White matter changes, remote lacunar infarctions, and volume loss as above.  No definite CT evidence for acute intracranial abnormality.  Given the background changes, if clinical concern for acute ischemia  persists, MRI is recommended.   Original Report Authenticated By: Carlos Levering, M.D.       1. Hypertension   2. Dizziness    Discharge Medication List as of 11/27/2012 10:41 PM    START taking these medications   Details  cloNIDine (CATAPRES) 0.1 MG tablet Take 1 tablet (0.1 mg total) by mouth daily as needed (for high blood pressure over 0000000 systolic)., Starting 11/27/2012, Until Discontinued, Print         Plan discharge   Rolland Porter, MD, [place], MD 11/27/12 [phone]

## 2013-03-27 ENCOUNTER — Encounter (INDEPENDENT_AMBULATORY_CARE_PROVIDER_SITE_OTHER): Payer: Self-pay | Admitting: Ophthalmology

## 2013-04-12 ENCOUNTER — Encounter (INDEPENDENT_AMBULATORY_CARE_PROVIDER_SITE_OTHER): Payer: Medicare Other | Admitting: Ophthalmology

## 2013-04-12 DIAGNOSIS — H35039 Hypertensive retinopathy, unspecified eye: Secondary | ICD-10-CM

## 2013-04-12 DIAGNOSIS — I10 Essential (primary) hypertension: Secondary | ICD-10-CM

## 2013-04-12 DIAGNOSIS — H4310 Vitreous hemorrhage, unspecified eye: Secondary | ICD-10-CM

## 2013-04-12 DIAGNOSIS — H2510 Age-related nuclear cataract, unspecified eye: Secondary | ICD-10-CM

## 2013-04-12 DIAGNOSIS — H348192 Central retinal vein occlusion, unspecified eye, stable: Secondary | ICD-10-CM

## 2013-05-10 ENCOUNTER — Encounter (HOSPITAL_COMMUNITY): Payer: Self-pay | Admitting: Pharmacy Technician

## 2013-05-12 ENCOUNTER — Other Ambulatory Visit: Payer: Self-pay | Admitting: Ophthalmology

## 2013-05-12 DIAGNOSIS — Z23 Encounter for immunization: Secondary | ICD-10-CM

## 2013-05-12 MED ORDER — TETRACAINE HCL 0.5 % OP SOLN: 1.0000 [drp] | OPHTHALMIC | Status: DC

## 2013-05-12 NOTE — H&P (Signed)
History & Physical:   DATE:   05-08-13  NAME:  Trevor Murphy, Trevor Murphy     [id number]       HISTORY OF PRESENT ILLNESS: Chief Eye Complaints   Glaucoma  patient  : complains of poor vision can't drive trouble doing daily activites      Patient was seen by Dr. Rodman Key patient told that if he had surgery OS that their wouldn't be any improvement with eyesight vision is very cloudy.   HPI: EYES: Reports symptoms of     LOCATION:   BOTH EYES        QUALITY/COURSE:   Reports condition is worsening.        INTENSITY/SEVERITY:    Reports measurement ( or degree) as severe .      DURATION:   Reports the general length of symptoms to be months.      ONSET/TIMING:   Reports occurrence as  CONSTANT   CONTEXT/WHEN:   Reports usually associated with   MODIFIERS/TREATMENTS:  Improved by              ROS:   GEN- Constitutional: HENT: GEN - Endocrine: Reports symptoms of LUNGS/Respiratory:  HEART/Cardiovascular: Reports symptoms of heart trouble.     hypertension ABD/Gastrointestinal:   Musculoskeletal (BJE): NEURO/Neurological: PSYCH/Psychiatric:    Is the pt oriented to time, place, person? 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 Zigmund Daniel  SURGERIES: PANRETINAL PHOTOCOAGULATION  OS SEC  Dr Olin Pia List - Surgeries  MEDICATIONS: Xalatan (Latanoprost) Ophthalmic Solution:    0.005% solution    SIG-   1 drop(s)  drop into each eye     once a day in the  PM                Comment-     Trusopt: 2% solution SIG-  Dose-  Freq-   1 drop right eye three times a day  TOBACCO: Never smoker   ICD#V13.89 Onset: 02/14/2013 11:00   Non-smoker  SOCIAL HISTORY: retired  FAMILY HISTORY: Positive family history for  -  HTN  2 Brothers , 1 Sister   HTN UNCLES/AUNTS: OTHERS/DISTANT:   Family History - 1st Degree Relatives:  Mother dead.  Father is dead.  ALLERGIES: Drug Allergies.  No Known.   Starter - Allergies - Summary:  PHYSICAL EXAMINATION: VS: BMI: 24.4.  BP: 146/76.  H: 70.00 in.  P: 62 /min.  RR: 20 /min.  W: 170lbs 0oz.      Exam: GENERAL: Appearance: General appearance can be described as well-nourished, well-developed, and in no acute distress.       [place]     OD [place] 20/70 PH 20/+2 OS [place] 20/HM PH 20/NI  EYEGLASSES:  OD:-1.00 sph  OS  -1.00 sph ADD:   +2.75  02/14/2013 13:42  MR   [id number] + 1.50 x 178    20/50 OS:+   -     NI       HM 2'' (shadow) ADD   K's OD:43.50/43.75 OS:41.50/43.75 VF: OD:     constricted                                          OS:    constricted  Motility:full  PUPILS:2+ MG OS     EYELIDS & OCULAR ADNEXA  SLE: Conjunctiva:pinguecula w/OU trace injection   Cornea:arcus pterygium with decrease tear film OU  Anterior Chamber:deep and quiet   OU  Iris: [id number]  [id number] trace rubeosis OS  Lens: 1-  2+  nuclear  sclerosis   OU  Vitreous:   [id number] hyalosis    OS: floaters   CCT  Ta   in mmHg     OD  21       OS 19 Time: 05/08/2013 12:35  Dilation:  Fundus:  optic nerve: OD:90% vertical cup, pale                                              OS:90% cup   Macula: [id number]                                                OS:   Vessels:shunt vessels, blot hem OD  Periphery:    Exam: GENERAL: Appearance: General appearance can be described as well-nourished, well-developed, and in no acute distress.    LYMPHATIC: HEAD, EARS,  NOSE AND THROAT: Ears-Nose (external) Inspection: Externally, nose and ears are normal in appearance and without scars, lesions, or nodules.          Hearing assessment shows no problems with normal conversation.    Nose exam, internally, reveals nasal mucosa, septum and turbinates are unremarkable.    Teeth, Gingiva, and Lip Exams: No lesions or evidence of infection.      EYES: see above  NECK: Neck tissue exam demonstrates no masses, symmetrical, and trachea is midline.      LUNGS and RESPIRATORY: Lung auscultation elicits no wheezing, rhonci, rales or rubs and with equal breath sounds.    Respiratory effort described as breathing is unlabored and chest movement is symmetrical.    HEART (Cardiovascular): Heart auscultation discovers regular rate and rhythm; no murmur, gallop or rub. Normal heart sounds.    ABDOMEN (Gastrointestinal): Mass/Tenderness Exam: Neither are present.     Liver/Spleen: No hepatomegaly or splenomegaly.   MUSCULOSKELETAL (BJE): Inspection-Palpation: No major bone, joint, tendon, or muscle changes.      NEUROLOGICAL: Alert and oriented. No major deficits of coordination or sensation.      PSYCHIATRIC: Insight and judgment appear  both to be intact and appropriate.    Mood and affect are described as normal mood and full affect.    SKIN: Skin Inspection: No rashes or lesions.  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 Zigmund Daniel  SURGICAL TREATMENT PLAN: phaco emulsion cataract extraction  with  intraocular lens implant  OD   Risk and benefits of surgery have been  reviewed with the patient and the patient agrees to proceed with the surgical procedure.   patient  cleared for cataract surgery OD from a retina evaluation see letter from Dr. Zigmund Daniel.   Handouts: Hypertension, glaucoma , what is glaucoma?, What is a cataract?, New Handout, glaucoma treatment.   Actions:  Plans for New Diagnosis:  Lab/Tests:   ECHO EXAM OF EYE.      Performed Date/Time: 05/08/2013 12:41 [id number]  Related Dxs-  Modifiers-    ___________________________ Marylynn Pearson, Brooke Bonito Starter - Inactive Problems:

## 2013-05-13 NOTE — Pre-Procedure Instructions (Signed)
Trevor Murphy  05/13/2013   Your procedure is scheduled on:  October 8  Report to [facility] Entrance "A" [address] at 08:00 AM.  Call this number if you have problems the morning of surgery: [phone]   Remember:   Do not eat food or drink liquids after midnight.   Take these medicines the morning of surgery with A SIP OF WATER: Clonidine, Tramadol (if needed)   Do not take Aspirin, Aleve, Naproxen, Advil, Ibuprofen, Vitamin, Herbs, or Supplements starting today   Do not wear jewelry, make-up or nail polish.  Do not wear lotions, powders, or perfumes. You may wear deodorant.  Do not shave 48 hours prior to surgery. Men may shave face and neck.  Do not bring valuables to the hospital.  [facility] is not responsible                  for any belongings or valuables.               Contacts, dentures or bridgework may not be worn into surgery.  Leave suitcase in the car. After surgery it may be brought to your room.  For patients admitted to the hospital, discharge time is determined by your                treatment team.               Patients discharged the day of surgery will not be allowed to drive  home.  Name and phone number of your driver: Family/ Friend  Special Instructions: Shower using CHG 2 nights before surgery and the night before surgery.  If you shower the day of surgery use CHG.  Use special wash - you have one bottle of CHG for all showers.  You should use approximately 1/3 of the bottle for each shower.   Please read over the following fact sheets that you were given: Pain Booklet, Coughing and Deep Breathing and Surgical Site Infection Prevention

## 2013-05-15 ENCOUNTER — Ambulatory Visit (HOSPITAL_COMMUNITY)
Admission: RE | Admit: 2013-05-15 | Discharge: 2013-05-15 | Disposition: A | Discharge status: Home / Self Care | Payer: Medicare Other | Source: Ambulatory Visit | Attending: Anesthesiology | Admitting: Anesthesiology

## 2013-05-15 ENCOUNTER — Encounter (HOSPITAL_COMMUNITY)
Admission: RE | Admit: 2013-05-15 | Discharge: 2013-05-15 | Disposition: A | Discharge status: Home / Self Care | Payer: Medicare Other | Source: Ambulatory Visit | Attending: Ophthalmology | Admitting: Ophthalmology

## 2013-05-15 DIAGNOSIS — I10 Essential (primary) hypertension: Secondary | ICD-10-CM | POA: Insufficient documentation

## 2013-05-15 DIAGNOSIS — Z01812 Encounter for preprocedural laboratory examination: Secondary | ICD-10-CM | POA: Insufficient documentation

## 2013-05-15 DIAGNOSIS — Z01818 Encounter for other preprocedural examination: Secondary | ICD-10-CM | POA: Insufficient documentation

## 2013-05-15 LAB — BASIC METABOLIC PANEL
BUN: 19 mg/dL (ref 6–23)
CO2: 23 mEq/L (ref 19–32)
Calcium: 8.8 mg/dL (ref 8.4–10.5)
Chloride: 107 mEq/L (ref 96–112)
Creatinine, Ser: 1.53 mg/dL — ABNORMAL HIGH (ref 0.50–1.35)
GFR calc Af Amer: 49 mL/min — ABNORMAL LOW (ref >90)
GFR calc non Af Amer: 42 mL/min — ABNORMAL LOW (ref >90)
Glucose, Bld: 111 mg/dL — ABNORMAL HIGH (ref 70–99)
Potassium: 4.1 mEq/L (ref 3.5–5.1)
Sodium: 140 mEq/L (ref 135–145)

## 2013-05-15 LAB — CBC
HCT: 43.8 % (ref 39.0–52.0)
Hemoglobin: 14.7 g/dL (ref 13.0–17.0)
MCH: 26.7 pg (ref 26.0–34.0)
MCHC: 33.6 g/dL (ref 30.0–36.0)
MCV: 79.5 fL (ref 78.0–100.0)
Platelets: 204 10*3/uL (ref 150–400)
RBC: 5.51 MIL/uL (ref 4.22–5.81)
RDW: 15.8 % — ABNORMAL HIGH (ref 11.5–15.5)
WBC: 5.3 10*3/uL (ref 4.0–10.5)

## 2013-05-16 MED ORDER — TROPICAMIDE 1 % OP SOLN
1.0000 [drp] | OPHTHALMIC | Status: AC
  Administered 2013-05-17 (×3): 1 [drp] via OPHTHALMIC

## 2013-05-16 MED ORDER — PHENYLEPHRINE HCL 2.5 % OP SOLN
1.0000 [drp] | OPHTHALMIC | Status: AC
  Administered 2013-05-17 (×3): 1 [drp] via OPHTHALMIC
  Filled 2013-05-16: qty 2

## 2013-05-16 MED ORDER — GATIFLOXACIN 0.5 % OP SOLN: 1.0000 [drp] | OPHTHALMIC | Status: AC

## 2013-05-16 MED ORDER — KETOROLAC TROMETHAMINE 0.5 % OP SOLN
1.0000 [drp] | OPHTHALMIC | Status: AC
  Administered 2013-05-17 (×2): 1 [drp] via OPHTHALMIC

## 2013-05-16 MED ORDER — CYCLOPENTOLATE HCL 1 % OP SOLN
1.0000 [drp] | OPHTHALMIC | Status: AC
  Administered 2013-05-17 (×3): 1 [drp] via OPHTHALMIC

## 2013-05-17 ENCOUNTER — Ambulatory Visit (HOSPITAL_COMMUNITY): Payer: Medicare Other | Admitting: Anesthesiology

## 2013-05-17 ENCOUNTER — Encounter (HOSPITAL_COMMUNITY): Payer: Medicare Other | Admitting: Anesthesiology

## 2013-05-17 ENCOUNTER — Encounter (HOSPITAL_COMMUNITY): Payer: Self-pay | Admitting: *Deleted

## 2013-05-17 ENCOUNTER — Ambulatory Visit (HOSPITAL_COMMUNITY)
Admission: RE | Admit: 2013-05-17 | Discharge: 2013-05-17 | Disposition: A | Discharge status: Home / Self Care | Payer: Medicare Other | Source: Ambulatory Visit | Attending: Ophthalmology | Admitting: Ophthalmology

## 2013-05-17 ENCOUNTER — Encounter (HOSPITAL_COMMUNITY)
Admission: RE | Disposition: A | Discharge status: Home / Self Care | Payer: Self-pay | Source: Ambulatory Visit | Attending: Ophthalmology

## 2013-05-17 DIAGNOSIS — H4320 Crystalline deposits in vitreous body, unspecified eye: Secondary | ICD-10-CM | POA: Insufficient documentation

## 2013-05-17 DIAGNOSIS — H409 Unspecified glaucoma: Secondary | ICD-10-CM | POA: Insufficient documentation

## 2013-05-17 DIAGNOSIS — H2510 Age-related nuclear cataract, unspecified eye: Secondary | ICD-10-CM | POA: Insufficient documentation

## 2013-05-17 DIAGNOSIS — H348192 Central retinal vein occlusion, unspecified eye, stable: Secondary | ICD-10-CM | POA: Insufficient documentation

## 2013-05-17 DIAGNOSIS — Z87891 Personal history of nicotine dependence: Secondary | ICD-10-CM | POA: Insufficient documentation

## 2013-05-17 DIAGNOSIS — I739 Peripheral vascular disease, unspecified: Secondary | ICD-10-CM | POA: Insufficient documentation

## 2013-05-17 DIAGNOSIS — Z8673 Personal history of transient ischemic attack (TIA), and cerebral infarction without residual deficits: Secondary | ICD-10-CM | POA: Insufficient documentation

## 2013-05-17 DIAGNOSIS — I10 Essential (primary) hypertension: Secondary | ICD-10-CM | POA: Insufficient documentation

## 2013-05-17 DIAGNOSIS — H47239 Glaucomatous optic atrophy, unspecified eye: Secondary | ICD-10-CM | POA: Insufficient documentation

## 2013-05-17 HISTORY — PX: CATARACT EXTRACTION EXTRACAPSULAR: SHX1305

## 2013-05-17 SURGERY — EXTRACTION, CATARACT, WITH IOL INSERTION
Anesthesia: Monitor Anesthesia Care | Site: Eye | Laterality: Right | Wound class: Clean

## 2013-05-17 MED ORDER — SODIUM HYALURONATE 10 MG/ML IO SOLN
INTRAOCULAR | Status: AC
  Filled 2013-05-17: qty 0.85

## 2013-05-17 MED ORDER — NA CHONDROIT SULF-NA HYALURON 40-30 MG/ML IO SOLN
INTRAOCULAR | Status: AC
  Filled 2013-05-17: qty 0.5

## 2013-05-17 MED ORDER — GENTAMICIN SULFATE 40 MG/ML IJ SOLN
INTRAMUSCULAR | Status: AC
  Filled 2013-05-17: qty 2

## 2013-05-17 MED ORDER — LIDOCAINE-EPINEPHRINE 2 %-1:100000 IJ SOLN
INTRAMUSCULAR | Status: AC
  Filled 2013-05-17: qty 1

## 2013-05-17 MED ORDER — TETRACAINE HCL 0.5 % OP SOLN
OPHTHALMIC | Status: AC
  Filled 2013-05-17: qty 2

## 2013-05-17 MED ORDER — PILOCARPINE HCL 4 % OP SOLN
OPHTHALMIC | Status: AC
  Filled 2013-05-17: qty 15

## 2013-05-17 MED ORDER — TOBRAMYCIN-DEXAMETHASONE 0.3-0.1 % OP OINT
TOPICAL_OINTMENT | OPHTHALMIC | Status: AC
Start: 2013-05-17 — End: 2013-05-17
  Filled 2013-05-17: qty 3.5

## 2013-05-17 MED ORDER — PROMETHAZINE HCL 25 MG/ML IJ SOLN: 6.2500 mg | INTRAMUSCULAR | Status: DC | PRN

## 2013-05-17 MED ORDER — NA CHONDROIT SULF-NA HYALURON 40-30 MG/ML IO SOLN
INTRAOCULAR | Status: DC | PRN
  Administered 2013-05-17: 0.5 mL via INTRAOCULAR

## 2013-05-17 MED ORDER — BSS IO SOLN
INTRAOCULAR | Status: DC | PRN
  Administered 2013-05-17: 15 mL via INTRAOCULAR

## 2013-05-17 MED ORDER — PROPOFOL 10 MG/ML IV BOLUS
INTRAVENOUS | Status: DC | PRN
  Administered 2013-05-17: 5 mg via INTRAVENOUS
  Administered 2013-05-17: 20 mg via INTRAVENOUS
  Administered 2013-05-17: 10 mg via INTRAVENOUS
  Administered 2013-05-17 (×3): 5 mg via INTRAVENOUS

## 2013-05-17 MED ORDER — DEXAMETHASONE SODIUM PHOSPHATE 10 MG/ML IJ SOLN
INTRAMUSCULAR | Status: AC
  Filled 2013-05-17: qty 1

## 2013-05-17 MED ORDER — OXYCODONE HCL 5 MG PO TABS: 5.0000 mg | ORAL_TABLET | Freq: Once | ORAL | Status: DC | PRN

## 2013-05-17 MED ORDER — TROPICAMIDE 1 % OP SOLN
OPHTHALMIC | Status: AC
  Administered 2013-05-17: 1 [drp] via OPHTHALMIC
  Filled 2013-05-17: qty 3

## 2013-05-17 MED ORDER — ACETYLCHOLINE CHLORIDE 1:100 IO SOLR
INTRAOCULAR | Status: AC
  Filled 2013-05-17: qty 1

## 2013-05-17 MED ORDER — ACETYLCHOLINE CHLORIDE 1:100 IO SOLR
INTRAOCULAR | Status: DC | PRN
  Administered 2013-05-17: 20 mg via INTRAOCULAR

## 2013-05-17 MED ORDER — EPINEPHRINE HCL 1 MG/ML IJ SOLN
INTRAMUSCULAR | Status: AC
  Filled 2013-05-17: qty 1

## 2013-05-17 MED ORDER — BSS IO SOLN
INTRAOCULAR | Status: AC
  Filled 2013-05-17: qty 500

## 2013-05-17 MED ORDER — PHENYLEPHRINE HCL 2.5 % OP SOLN
OPHTHALMIC | Status: AC
  Administered 2013-05-17: 1 [drp] via OPHTHALMIC
  Filled 2013-05-17: qty 2

## 2013-05-17 MED ORDER — TOBRAMYCIN 0.3 % OP OINT
TOPICAL_OINTMENT | OPHTHALMIC | Status: DC | PRN
  Administered 2013-05-17: 1 via OPHTHALMIC

## 2013-05-17 MED ORDER — BSS IO SOLN
INTRAOCULAR | Status: AC
  Filled 2013-05-17: qty 15

## 2013-05-17 MED ORDER — FENTANYL CITRATE 0.05 MG/ML IJ SOLN
INTRAMUSCULAR | Status: DC | PRN
  Administered 2013-05-17: 50 ug via INTRAVENOUS

## 2013-05-17 MED ORDER — MIDAZOLAM HCL 2 MG/2ML IJ SOLN: 0.5000 mg | Freq: Once | INTRAMUSCULAR | Status: DC | PRN

## 2013-05-17 MED ORDER — OXYCODONE HCL 5 MG/5ML PO SOLN: 5.0000 mg | Freq: Once | ORAL | Status: DC | PRN

## 2013-05-17 MED ORDER — SODIUM CHLORIDE 0.9 % IV SOLN
INTRAVENOUS | Status: DC | PRN
  Administered 2013-05-17: 10:00:00 via INTRAVENOUS

## 2013-05-17 MED ORDER — SODIUM HYALURONATE 10 MG/ML IO SOLN
INTRAOCULAR | Status: DC | PRN
  Administered 2013-05-17 (×2): 0.85 mL via INTRAOCULAR

## 2013-05-17 MED ORDER — LIDOCAINE HCL (CARDIAC) 20 MG/ML IV SOLN
INTRAVENOUS | Status: DC | PRN
  Administered 2013-05-17: 40 mg via INTRAVENOUS

## 2013-05-17 MED ORDER — HYALURONIDASE HUMAN 150 UNIT/ML IJ SOLN
INTRAMUSCULAR | Status: AC
  Filled 2013-05-17: qty 1

## 2013-05-17 MED ORDER — LIDOCAINE HCL 2 % IJ SOLN
INTRAMUSCULAR | Status: AC
  Filled 2013-05-17: qty 20

## 2013-05-17 MED ORDER — PILOCARPINE HCL 4 % OP SOLN
OPHTHALMIC | Status: DC | PRN
  Administered 2013-05-17: 1 [drp] via OPHTHALMIC

## 2013-05-17 MED ORDER — BUPIVACAINE HCL (PF) 0.75 % IJ SOLN
INTRAMUSCULAR | Status: AC
  Filled 2013-05-17: qty 10

## 2013-05-17 MED ORDER — TOBRAMYCIN-DEXAMETHASONE 0.3-0.1 % OP OINT
TOPICAL_OINTMENT | OPHTHALMIC | Status: AC
  Filled 2013-05-17: qty 3.5

## 2013-05-17 MED ORDER — SODIUM CHLORIDE 0.9 % IV SOLN
INTRAVENOUS | Status: DC
  Administered 2013-05-17: 35 mL/h via INTRAVENOUS

## 2013-05-17 MED ORDER — CLONIDINE HCL 0.1 MG PO TABS
0.1000 mg | ORAL_TABLET | Freq: Every day | ORAL | Status: DC
  Administered 2013-05-17: 0.1 mg via ORAL

## 2013-05-17 MED ORDER — EPINEPHRINE HCL 1 MG/ML IJ SOLN
INTRAOCULAR | Status: DC | PRN
  Administered 2013-05-17: 10:00:00

## 2013-05-17 MED ORDER — ONDANSETRON HCL 4 MG/2ML IJ SOLN
INTRAMUSCULAR | Status: DC | PRN
  Administered 2013-05-17: 4 mg via INTRAMUSCULAR

## 2013-05-17 MED ORDER — MEPERIDINE HCL 25 MG/ML IJ SOLN
6.2500 mg | INTRAMUSCULAR | Status: DC | PRN
Start: 2013-05-17 — End: 2013-05-17

## 2013-05-17 MED ORDER — KETOROLAC TROMETHAMINE 0.5 % OP SOLN
OPHTHALMIC | Status: AC
  Administered 2013-05-17: 1 [drp] via OPHTHALMIC
  Filled 2013-05-17: qty 5

## 2013-05-17 MED ORDER — FENTANYL CITRATE 0.05 MG/ML IJ SOLN: 25.0000 ug | INTRAMUSCULAR | Status: DC | PRN

## 2013-05-17 MED ORDER — LIDOCAINE HCL 2 % IJ SOLN
INTRAMUSCULAR | Status: DC | PRN
  Administered 2013-05-17: 11:00:00 via RETROBULBAR

## 2013-05-17 MED ORDER — CYCLOPENTOLATE HCL 1 % OP SOLN
OPHTHALMIC | Status: AC
  Administered 2013-05-17: 1 [drp] via OPHTHALMIC
  Filled 2013-05-17: qty 2

## 2013-05-17 SURGICAL SUPPLY — 39 items
APPLICATOR COTTON TIP 6IN STRL (MISCELLANEOUS) ×2 IMPLANT
APPLICATOR DR MATTHEWS STRL (MISCELLANEOUS) ×2 IMPLANT
BLADE KERATOME 2.75 (BLADE) ×2 IMPLANT
BLADE MINI RND TIP GREEN BEAV (BLADE) IMPLANT
BLADE STAB KNIFE 45DEG (BLADE) IMPLANT
CANNULA ANTERIOR CHAMBER 27GA (MISCELLANEOUS) ×2 IMPLANT
CLOTH BEACON ORANGE TIMEOUT ST (SAFETY) IMPLANT
CORDS BIPOLAR (ELECTRODE) IMPLANT
COVER MAYO STAND STRL (DRAPES) ×2 IMPLANT
DRAPE OPHTHALMIC 40X48 W POUCH (DRAPES) ×2 IMPLANT
DRAPE RETRACTOR (MISCELLANEOUS) ×2 IMPLANT
FILTER BLUE MILLIPORE (MISCELLANEOUS) IMPLANT
GLOVE BIO SURGEON STRL SZ8 (GLOVE) ×2 IMPLANT
GLOVE SS BIOGEL STRL SZ 6.5 (GLOVE) ×1 IMPLANT
GLOVE SUPERSENSE BIOGEL SZ 6.5 (GLOVE) ×1
GLOVE SURG SS PI 7.0 STRL IVOR (GLOVE) ×2 IMPLANT
GOWN STRL NON-REIN LRG LVL3 (GOWN DISPOSABLE) ×4 IMPLANT
KIT BASIN OR (CUSTOM PROCEDURE TRAY) ×2 IMPLANT
KNIFE CRESCENT 2.5 55 ANG (BLADE) IMPLANT
LENS IOL ACRSF IQ PC 22.0 (Intraocular Lens) ×1 IMPLANT
LENS IOL ACRYSOF IQ POST 22.0 (Intraocular Lens) ×2 IMPLANT
MASK EYE SHIELD (GAUZE/BANDAGES/DRESSINGS) ×2 IMPLANT
NEEDLE 25GX 5/8IN NON SAFETY (NEEDLE) ×2 IMPLANT
NS IRRIG 1000ML POUR BTL (IV SOLUTION) ×2 IMPLANT
PACK CATARACT CUSTOM (CUSTOM PROCEDURE TRAY) ×2 IMPLANT
PACK CATARACT MCHSCP (PACKS) ×2 IMPLANT
PAD ARMBOARD 7.5X6 YLW CONV (MISCELLANEOUS) ×2 IMPLANT
PAD EYE OVAL STERILE LF (GAUZE/BANDAGES/DRESSINGS) ×2 IMPLANT
PROBE ANTERIOR VITRECTOR (OPHTHALMIC) IMPLANT
SPEAR EYE SURG WECK-CEL (MISCELLANEOUS) IMPLANT
SUT ETHILON 10 0 CS140 6 (SUTURE) ×2 IMPLANT
SUT SILK 4 0 C 3 735G (SUTURE) IMPLANT
SUT SILK 6 0 G 6 (SUTURE) ×2 IMPLANT
SUT VICRYL 8 0 TG140 8 (SUTURE) IMPLANT
SYR 3ML LL SCALE MARK (SYRINGE) IMPLANT
TAPE PAPER MEDFIX 1IN X 10YD (GAUZE/BANDAGES/DRESSINGS) ×2 IMPLANT
TIP PHACO STRAIGHT 30DEG (OPHTHALMIC) ×2 IMPLANT
TOWEL OR 17X24 6PK STRL BLUE (TOWEL DISPOSABLE) ×4 IMPLANT
WATER STERILE IRR 1000ML POUR (IV SOLUTION) ×2 IMPLANT

## 2013-05-17 NOTE — Anesthesia Preprocedure Evaluation (Addendum)
Anesthesia Evaluation  Patient identified by MRN, date of birth, ID band Patient awake    Reviewed: Allergy & Precautions, H&P , NPO status , Patient's Chart, lab work & pertinent test results  History of Anesthesia Complications Negative for: history of anesthetic complications  Airway Mallampati: II TM Distance: >3 FB Neck ROM: Full    Dental  (+) Edentulous Upper, Missing, Dental Advisory Given and Poor Dentition   Pulmonary former smoker,  breath sounds clear to auscultation  Pulmonary exam normal       Cardiovascular hypertension, Pt. on medications + Peripheral Vascular Disease Rhythm:Regular Rate:Normal  12/13 ECHO: EF 55-60%, mild MR, mild AI   Neuro/Psych TIA   GI/Hepatic negative GI ROS, Neg liver ROS,   Endo/Other  negative endocrine ROS  Renal/GU Renal InsufficiencyRenal disease (creat 1.53)     Musculoskeletal   Abdominal   Peds  Hematology negative hematology ROS (+)   Anesthesia Other Findings   Reproductive/Obstetrics                         Anesthesia Physical Anesthesia Plan  ASA: II  Anesthesia Plan: MAC   Post-op Pain Management:    Induction:   Airway Management Planned: Natural Airway and Nasal Cannula  Additional Equipment:   Intra-op Plan:   Post-operative Plan:   Informed Consent: I have reviewed the patients History and Physical, chart, labs and discussed the procedure including the risks, benefits and alternatives for the proposed anesthesia with the patient or authorized representative who has indicated his/her understanding and acceptance.   Dental advisory given  Plan Discussed with: CRNA and Surgeon  Anesthesia Plan Comments: (Plan routine monitors, MAC)        Anesthesia Quick Evaluation

## 2013-05-17 NOTE — Anesthesia Postprocedure Evaluation (Signed)
  Anesthesia Post-op Note  Patient: Trevor Murphy  Procedure(s) Performed: Procedure(s): CATARACT EXTRACTION EXTRACAPSULAR WITH INTRAOCULAR LENS PLACEMENT (IOC) RIGHT EYE (Right)  Patient Location: PACU  Anesthesia Type:MAC  Level of Consciousness: awake, alert , oriented and patient cooperative  Airway and Oxygen Therapy: Patient Spontanous Breathing  Post-op Pain: none  Post-op Assessment: Post-op Vital signs reviewed, Patient's Cardiovascular Status Stable, Respiratory Function Stable, Patent Airway, No signs of Nausea or vomiting and Pain level controlled  Post-op Vital Signs: Reviewed and stable  Complications: No apparent anesthesia complications

## 2013-05-17 NOTE — Interval H&P Note (Signed)
History and Physical Interval Note:  05/17/2013 10:35 AM  Trevor Murphy  has presented today for surgery, with the diagnosis of Senile nuclear sclerosis, right eye  The various methods of treatment have been discussed with the patient and family. After consideration of risks, benefits and other options for treatment, the patient has consented to  Procedure(s): CATARACT EXTRACTION EXTRACAPSULAR WITH INTRAOCULAR LENS PLACEMENT (IOC) RIGHT EYE (Right) as a surgical intervention .  The patient's history has been reviewed, patient examined, no change in status, stable for surgery.  I have reviewed the patient's chart and labs.  Questions were answered to the patient's satisfaction.     Forrester Blando

## 2013-05-17 NOTE — H&P (View-Only) (Signed)
History & Physical:   DATE:   05-08-13  NAME:  Trevor Murphy, Trevor Murphy     [id number]       HISTORY OF PRESENT ILLNESS: Chief Eye Complaints   Glaucoma  patient  : complains of poor vision can't drive trouble doing daily activites      Patient was seen by Dr. Rodman Key patient told that if he had surgery OS that their wouldn't be any improvement with eyesight vision is very cloudy.   HPI: EYES: Reports symptoms of     LOCATION:   BOTH EYES        QUALITY/COURSE:   Reports condition is worsening.        INTENSITY/SEVERITY:    Reports measurement ( or degree) as severe .      DURATION:   Reports the general length of symptoms to be months.      ONSET/TIMING:   Reports occurrence as  CONSTANT   CONTEXT/WHEN:   Reports usually associated with   MODIFIERS/TREATMENTS:  Improved by              ROS:   GEN- Constitutional: HENT: GEN - Endocrine: Reports symptoms of LUNGS/Respiratory:  HEART/Cardiovascular: Reports symptoms of heart trouble.     hypertension ABD/Gastrointestinal:   Musculoskeletal (BJE): NEURO/Neurological: PSYCH/Psychiatric:    Is the pt oriented to time, place, person? 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 Zigmund Daniel  SURGERIES: PANRETINAL PHOTOCOAGULATION  OS SEC  Dr Olin Pia List - Surgeries  MEDICATIONS: Xalatan (Latanoprost) Ophthalmic Solution:    0.005% solution    SIG-   1 drop(s)  drop into each eye     once a day in the  PM                Comment-     Trusopt: 2% solution SIG-  Dose-  Freq-   1 drop right eye three times a day  TOBACCO: Never smoker   ICD#V13.89 Onset: 02/14/2013 11:00   Non-smoker  SOCIAL HISTORY: retired  FAMILY HISTORY: Positive family history for  -  HTN  2 Brothers , 1 Sister   HTN UNCLES/AUNTS: OTHERS/DISTANT:   Family History - 1st Degree Relatives:  Mother dead.  Father is dead.  ALLERGIES: Drug Allergies.  No Known.   Starter - Allergies - Summary:  PHYSICAL EXAMINATION: VS: BMI: 24.4.  BP: 146/76.  H: 70.00 in.  P: 62 /min.  RR: 20 /min.  W: 170lbs 0oz.      Exam: GENERAL: Appearance: General appearance can be described as well-nourished, well-developed, and in no acute distress.       Va     OD [place] 20/70 PH 20/+2 OS  20/HM PH 20/NI  EYEGLASSES:  OD:-1.00 sph  OS  -1.00 sph ADD:   +2.75  02/14/2013 13:42  MR   [id number] + 1.50 x 178    20/50 OS:+   -     NI       HM 2'' (shadow) ADD   K's OD:43.50/43.75 OS:41.50/43.75 VF: OD:     constricted                                          OS:    constricted  Motility:full  PUPILS:2+ MG OS     EYELIDS & OCULAR ADNEXA  SLE: Conjunctiva:pinguecula w/OU trace injection   Cornea:arcus pterygium with decrease tear film OU  Anterior Chamber:deep and quiet   OU  Iris: [id number]  [id number] trace rubeosis OS  Lens: 1-  2+  nuclear  sclerosis   OU  Vitreous:   [id number] hyalosis    OS: floaters   CCT  Ta   in mmHg     OD  21       OS 19 Time: 05/08/2013 12:35  Dilation:  Fundus:  optic nerve: OD:90% vertical cup, pale                                              OS:90% cup   Macula: [id number]                                                OS:   Vessels:shunt vessels, blot hem OD  Periphery:    Exam: GENERAL: Appearance: General appearance can be described as well-nourished, well-developed, and in no acute distress.    LYMPHATIC: HEAD, EARS,  NOSE AND THROAT: Ears-Nose (external) Inspection: Externally, nose and ears are normal in appearance and without scars, lesions, or nodules.          Hearing assessment shows no problems with normal conversation.    Nose exam, internally, reveals nasal mucosa, septum and turbinates are unremarkable.    Teeth, Gingiva, and Lip Exams: No lesions or evidence of infection.      EYES: see above  NECK: Neck tissue exam demonstrates no masses, symmetrical, and trachea is midline.      LUNGS and RESPIRATORY: Lung auscultation elicits no wheezing, rhonci, rales or rubs and with equal breath sounds.    Respiratory effort described as breathing is unlabored and chest movement is symmetrical.    HEART (Cardiovascular): Heart auscultation discovers regular rate and rhythm; no murmur, gallop or rub. Normal heart sounds.    ABDOMEN (Gastrointestinal): Mass/Tenderness Exam: Neither are present.     Liver/Spleen: No hepatomegaly or splenomegaly.   MUSCULOSKELETAL (BJE): Inspection-Palpation: No major bone, joint, tendon, or muscle changes.      NEUROLOGICAL: Alert and oriented. No major deficits of coordination or sensation.      PSYCHIATRIC: Insight and judgment appear  both to be intact and appropriate.    Mood and affect are described as normal mood and full affect.    SKIN: Skin Inspection: No rashes or lesions.  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 Zigmund Daniel  SURGICAL TREATMENT PLAN: phaco emulsion cataract extraction  with  intraocular lens implant  OD   Risk and benefits of surgery have been  reviewed with the patient and the patient agrees to proceed with the surgical procedure.   patient  cleared for cataract surgery OD from a retina evaluation see letter from Dr. Zigmund Daniel.   Handouts: Hypertension, glaucoma , what is glaucoma?, What is a cataract?, New Handout, glaucoma treatment.   Actions:  Plans for New Diagnosis:  Lab/Tests:   ECHO EXAM OF EYE.      Performed Date/Time: 05/08/2013 12:41 [id number]  Related Dxs-  Modifiers-    ___________________________ Marylynn Pearson, Brooke Bonito Starter - Inactive Problems:

## 2013-05-17 NOTE — Preoperative (Signed)
Beta Blockers   Reason not to administer Beta Blockers:Not Applicable 

## 2013-05-17 NOTE — Transfer of Care (Signed)
Immediate Anesthesia Transfer of Care Note  Patient: Trevor Murphy  Procedure(s) Performed: Procedure(s): CATARACT EXTRACTION EXTRACAPSULAR WITH INTRAOCULAR LENS PLACEMENT (IOC) RIGHT EYE (Right)  Patient Location: PACU  Anesthesia Type:MAC  Level of Consciousness: awake, alert , oriented and sedated  Airway & Oxygen Therapy: Patient Spontanous Breathing  Post-op Assessment: Report given to PACU RN, Post -op Vital signs reviewed and stable and Patient moving all extremities  Post vital signs: Reviewed and stable  Complications: No apparent anesthesia complications

## 2013-05-17 NOTE — Op Note (Signed)
Preoperative diagnosis: Visually significant cataract and glaucoma right eye Has central retinal vein occlusion left eye is essentially monocular patient blueline with right eye patched Postoperative diagnosis: Same Procedure: Phacoemulsification with intraocular lens implant right eye Anesthesia: 2% Xylocaine 50-50 mixture 0.75% Marcaine with ample [place] Complications none Procedure: The patient taken to the operating room where he was given a peribulbar block with the aforementioned local anesthetic agent. Following this the patient's face was prepped and draped in the usual sterile fashion with a surgeon sitting temporally the operating microscope in position it was noted that there was asked to [facility] in the vitreous which made visibility slightly more difficult. Using a Weck-Cel sponge to fixate the globe and a 15 blade was used to enter through superior clear cornea Viscoat was injected into the anterior chamber. Following this with an additional Weck-Cel sponge a 2.75 mm keratome blade was used in a stepwise fashion through temporal clear cornea to into the chamber a bent 25-gauge needle was used to incise anterior capsule and a continuous tear curvilinear capsulorrhexis was formed the Utrata forceps were used to remove the anterior capsule. BSS was used to hydrodissect the nucleus and the nucleus was noted to rise elevated out of capsular bag this point therefore additional Viscoat was injected in the eye and the phacoemulsification unit was then used to sculpt the nucleus into 4 troughs for troughs were separated and all nuclear fragments were removed from the eye. Following this the irrigation aspiration handpiece was used to aspirate all cortical fibers the posterior capsule the posterior capsule remained intact. At this point Provisc was injected in the eye the intraocular lens implant was examined and noted to have no defects the lens was loaded the lens is in place at the incision and  was injected into the eye the trailing haptic remained stuck on the lens inserter a forceps was used to release the trailing haptic high over the lens poor at this point tear an incision was made at the paracentesis tract to enlarge this and a McPherson forceps was used to grasp the lens an angle scissor was used to cut the lens high over the lens continued to move and in danger of cutting the posterior capsule all instruments removed from the eye. Additional Provisc was injected in the eye we'll scleral scissors were then used to enlarge the incision to approximately 4.5 mm the McPherson forceps were then used to grasp the lens and remove it from the eye. An oh the lens was then taken from the package in the lens which was an Alcon AcrySof SN 60 WF 22.0 diopter lens SN [id number] the lens was carefully folded in the lens injector and then injected the Kuglen hook was used to position the lens the chamber was deepened with Provisc. 4 interrupted 10-0 nylon sutures were then placed to secure the incision the eye was pressurized Miochol was injected in the eye the pupil was noted to come down round with the lens in good position. The Provisc was removed from the eye using the irrigation aspiration device take extra time to remove all Provisc this the patient has glaucoma. The eye was pressurized and there was no leakage therefore all instruments were removed from the eye. Topical pilocarpine 4% and topical TobraDex ointment were applied to the eye a patch and Fox U. were placed and the patient returned to recovery area in stable condition Consultations none Trevor Murphy M.D.

## 2013-05-17 NOTE — Anesthesia Procedure Notes (Signed)
Procedure Name: MAC Date/Time: 05/17/2013 10:46 AM Performed by: Marylynn Pearson Pre-anesthesia Checklist: Patient identified, Emergency Drugs available, Suction available, Patient being monitored and Timeout performed Patient Re-evaluated:Patient Re-evaluated prior to inductionOxygen Delivery Method: Nasal cannula Intubation Type: IV induction Placement Confirmation: positive ETCO2

## 2013-05-19 ENCOUNTER — Encounter (HOSPITAL_COMMUNITY): Payer: Self-pay | Admitting: Ophthalmology

## 2013-06-18 ENCOUNTER — Emergency Department (HOSPITAL_COMMUNITY)
Admission: EM | Admit: 2013-06-18 | Discharge: 2013-06-18 | Disposition: A | Discharge status: Home / Self Care | Payer: Medicare Other | Attending: Emergency Medicine | Admitting: Emergency Medicine

## 2013-06-18 ENCOUNTER — Emergency Department (HOSPITAL_COMMUNITY): Payer: Medicare Other

## 2013-06-18 ENCOUNTER — Encounter (HOSPITAL_COMMUNITY): Payer: Self-pay | Admitting: Emergency Medicine

## 2013-06-18 DIAGNOSIS — Z7982 Long term (current) use of aspirin: Secondary | ICD-10-CM | POA: Insufficient documentation

## 2013-06-18 DIAGNOSIS — I498 Other specified cardiac arrhythmias: Secondary | ICD-10-CM | POA: Insufficient documentation

## 2013-06-18 DIAGNOSIS — R42 Dizziness and giddiness: Secondary | ICD-10-CM | POA: Insufficient documentation

## 2013-06-18 DIAGNOSIS — I129 Hypertensive chronic kidney disease with stage 1 through stage 4 chronic kidney disease, or unspecified chronic kidney disease: Secondary | ICD-10-CM | POA: Insufficient documentation

## 2013-06-18 DIAGNOSIS — I10 Essential (primary) hypertension: Secondary | ICD-10-CM

## 2013-06-18 DIAGNOSIS — Z8719 Personal history of other diseases of the digestive system: Secondary | ICD-10-CM | POA: Insufficient documentation

## 2013-06-18 DIAGNOSIS — Z8669 Personal history of other diseases of the nervous system and sense organs: Secondary | ICD-10-CM | POA: Insufficient documentation

## 2013-06-18 DIAGNOSIS — I471 Supraventricular tachycardia: Secondary | ICD-10-CM

## 2013-06-18 DIAGNOSIS — Z862 Personal history of diseases of the blood and blood-forming organs and certain disorders involving the immune mechanism: Secondary | ICD-10-CM | POA: Insufficient documentation

## 2013-06-18 DIAGNOSIS — M129 Arthropathy, unspecified: Secondary | ICD-10-CM | POA: Insufficient documentation

## 2013-06-18 DIAGNOSIS — N189 Chronic kidney disease, unspecified: Secondary | ICD-10-CM | POA: Insufficient documentation

## 2013-06-18 DIAGNOSIS — Z79899 Other long term (current) drug therapy: Secondary | ICD-10-CM | POA: Insufficient documentation

## 2013-06-18 DIAGNOSIS — Z8639 Personal history of other endocrine, nutritional and metabolic disease: Secondary | ICD-10-CM | POA: Insufficient documentation

## 2013-06-18 DIAGNOSIS — R079 Chest pain, unspecified: Secondary | ICD-10-CM | POA: Insufficient documentation

## 2013-06-18 LAB — COMPREHENSIVE METABOLIC PANEL
ALT: 9 U/L (ref 0–53)
AST: 23 U/L (ref 0–37)
Albumin: 3.5 g/dL (ref 3.5–5.2)
Alkaline Phosphatase: 103 U/L (ref 39–117)
BUN: 15 mg/dL (ref 6–23)
CO2: 25 mEq/L (ref 19–32)
Calcium: 8.7 mg/dL (ref 8.4–10.5)
Chloride: 105 mEq/L (ref 96–112)
Creatinine, Ser: 1.55 mg/dL — ABNORMAL HIGH (ref 0.50–1.35)
GFR calc Af Amer: 48 mL/min — ABNORMAL LOW (ref >90)
GFR calc non Af Amer: 42 mL/min — ABNORMAL LOW (ref >90)
Glucose, Bld: 104 mg/dL — ABNORMAL HIGH (ref 70–99)
Potassium: 4 mEq/L (ref 3.5–5.1)
Sodium: 143 mEq/L (ref 135–145)
Total Bilirubin: 0.5 mg/dL (ref 0.3–1.2)
Total Protein: 7.5 g/dL (ref 6.0–8.3)

## 2013-06-18 LAB — CBC WITH DIFFERENTIAL/PLATELET
Basophils Absolute: 0 10*3/uL (ref 0.0–0.1)
Basophils Relative: 0 % (ref 0–1)
Eosinophils Absolute: 0.2 10*3/uL (ref 0.0–0.7)
Eosinophils Relative: 4 % (ref 0–5)
HCT: 48.4 % (ref 39.0–52.0)
Hemoglobin: 16.2 g/dL (ref 13.0–17.0)
Lymphocytes Relative: 26 % (ref 12–46)
Lymphs Abs: 1.3 10*3/uL (ref 0.7–4.0)
MCH: 26.7 pg (ref 26.0–34.0)
MCHC: 33.5 g/dL (ref 30.0–36.0)
MCV: 79.9 fL (ref 78.0–100.0)
Monocytes Absolute: 0.3 10*3/uL (ref 0.1–1.0)
Monocytes Relative: 7 % (ref 3–12)
Neutro Abs: 3.1 10*3/uL (ref 1.7–7.7)
Neutrophils Relative %: 63 % (ref 43–77)
Platelets: 201 10*3/uL (ref 150–400)
RBC: 6.06 MIL/uL — ABNORMAL HIGH (ref 4.22–5.81)
RDW: 15.7 % — ABNORMAL HIGH (ref 11.5–15.5)
WBC: 4.9 10*3/uL (ref 4.0–10.5)

## 2013-06-18 LAB — POCT I-STAT TROPONIN I
Troponin i, poc: 0.04 ng/mL (ref 0.00–0.08)
Troponin i, poc: 0.03 ng/mL (ref 0.00–0.08)

## 2013-06-18 MED ORDER — LISINOPRIL 20 MG PO TABS
20.0000 mg | ORAL_TABLET | Freq: Once | ORAL | Status: AC
  Administered 2013-06-18: 20 mg via ORAL
  Filled 2013-06-18: qty 1

## 2013-06-18 MED ORDER — CARVEDILOL 6.25 MG PO TABS: 6.2500 mg | ORAL_TABLET | Freq: Two times a day (BID) | ORAL | Status: DC

## 2013-06-18 MED ORDER — HYDROCHLOROTHIAZIDE 25 MG PO TABS: 25.0000 mg | ORAL_TABLET | Freq: Every day | ORAL | Status: DC

## 2013-06-18 MED ORDER — LABETALOL HCL 5 MG/ML IV SOLN
10.0000 mg | Freq: Once | INTRAVENOUS | Status: AC
  Administered 2013-06-18: 10 mg via INTRAVENOUS
  Filled 2013-06-18: qty 4

## 2013-06-18 NOTE — ED Notes (Signed)
Per EMS, pt. Woke from sleep with palpitations. When EMS arrived, HR 170. Given 6 of adenosine and patient converted. Currently HR 99. Pt. C/o lightheadedness. Hx of same.

## 2013-06-18 NOTE — ED Notes (Signed)
Patient transported to X-ray 

## 2013-06-18 NOTE — ED Provider Notes (Signed)
CSN: [id number]     Arrival date & time 06/18/13  [id number] History   First MD Initiated Contact with Patient 06/18/13 [phone]     Chief Complaint  Patient presents with  . Palpitations   (Consider location/radiation/quality/duration/timing/severity/associated sxs/prior Treatment) HPI Comments: The patient is a 78 year old male with a history of HTN, CKD, presenting to the emergency department with a complaint of palpitations.The patient reports the onset occurred while sleeping at 0200 this morning until 0600 when EMS gave him 6 of adenosine. The patient also complains of centralized chest pain. He also reports that his BP was elevated yesterday but unsure what the actual reading was. He reports associated lightheadedness.  He denies a change in vision. He denies headache, lower extremity edema, nausea, vomiting, fever or chills, diarrhea, constipation, cough congestion, or diaphoresis. He also reports drinking a half of a 1/5 of liquor yesterday afternoon. Denies Cocaine use. He initially reported he was compliant with his blood pressure medication yesterday but did not take his medication this morning. Further question revealed the patint has not been compliant with medication and is unsure what his current regimen is. Reports last Carvedilol was about a month ago. The patients family reports that the patient has had at least two other episodes of palpitations in the past.   The history is provided by the patient.    Past Medical History  Diagnosis Date  . Poor circulation   . Hernia     umbilical  . Gout   . Glaucoma   . Hypertension     does not see a cardiologist  . Arthritis   . Chronic kidney disease   . Headache(784.0)     " WHEN MY BLOOD PRESSURE IS UP"   Past Surgical History  Procedure Laterality Date  . Hernia repair    . Ventral hernia repair  03/07/2012    Procedure: LAPAROSCOPIC VENTRAL HERNIA;  Surgeon: Gwenyth Ober, MD;  Location: [place];  Service: General;  Laterality: N/A;   . Appendectomy    . Cataract extraction extracapsular Right 05/17/2013    Procedure: CATARACT EXTRACTION EXTRACAPSULAR WITH INTRAOCULAR LENS PLACEMENT (IOC) RIGHT EYE;  Surgeon: Marylynn Pearson, MD;  Location: [facility];  Service: Ophthalmology;  Laterality: Right;   No family history on file. History  Substance Use Topics  . Smoking status: Never Smoker   . Smokeless tobacco: Never Used  . Alcohol Use: 0.6 oz/week    1 Cans of beer per week     Comment: occassional    Review of Systems  All other systems reviewed and are negative.    Allergies  Review of patient's allergies indicates no known allergies.  Home Medications   Current Outpatient Rx  Name  Route  Sig  Dispense  Refill  . acetaminophen (TYLENOL) 500 MG tablet   Oral   Take 1,000 mg by mouth every 6 (six) hours as needed for pain.         [name] Kitchen aspirin EC 81 MG EC tablet   Oral   Take 1 tablet (81 mg total) by mouth daily.         [name] Kitchen aspirin EC 81 MG tablet   Oral   Take 324 mg by mouth once.         . rosuvastatin (CRESTOR) 5 MG tablet   Oral   Take 5 mg by mouth daily.          . carvedilol (COREG) 6.25 MG tablet   Oral  Take 1 tablet (6.25 mg total) by mouth 2 (two) times daily with a meal.   30 tablet   0    BP 187/90  Pulse 70  Temp(Src) 97.8 F (36.6 C) (Oral)  Resp 20  Ht 5\' 10"  (1.778 m)  Wt 171 lb (77.565 kg)  BMI 24.54 kg/m2  SpO2 98% Physical Exam  Nursing note and vitals reviewed. Constitutional: He is oriented to person, place, and time. He appears well-developed and well-nourished. No distress.  HENT:  Head: Normocephalic and atraumatic.  Neck: Neck supple.  Cardiovascular: Normal rate and regular rhythm.   Pulmonary/Chest: Effort normal and breath sounds normal. No respiratory distress. He has no wheezes. He has no rales.  Chest non tender to palpation  Abdominal: Soft. Bowel sounds are normal. He exhibits no distension. There is no tenderness. There is no rebound.   Musculoskeletal: He exhibits no edema.  Neurological: He is alert and oriented to person, place, and time.  Skin: Skin is warm and dry. No rash noted.    ED Course  Procedures (including critical care time) Labs Review Labs Reviewed  CBC WITH DIFFERENTIAL - Abnormal; Notable for the following:    RBC 6.06 (*)    RDW 15.7 (*)    All other components within normal limits  COMPREHENSIVE METABOLIC PANEL - Abnormal; Notable for the following:    Glucose, Bld 104 (*)    Creatinine, Ser 1.55 (*)    GFR calc non Af Amer 42 (*)    GFR calc Af Amer 48 (*)    All other components within normal limits  POCT I-STAT TROPONIN I  POCT I-STAT TROPONIN I   Imaging Review No results found.  EKG Interpretation     Ventricular Rate:  100 PR Interval:  217 QRS Duration: 82 QT Interval:  367 QTC Calculation: 473 R Axis:   33 Text Interpretation:  Sinus tachycardia Prolonged PR interval Left atrial enlargement Probable left ventricular hypertrophy Borderline T abnormalities, inferior leads            MDM   1. SVT (supraventricular tachycardia)   2. Hypertension    The patient presents after converted to NSR after adenosine 6.  Reports non compliance over the past month with BP medication. Will reduce BP by 20-25% in the ED. Lopressor given.  0945 patient's BP is 185/105.  Reports lightheadedness is better.   1130 patient BP 190/92 reports no chest pain and lightheadedness.   Discussed patient history and condition with Dr. Mariane Masters and he agrees the patient is stable for discharge at this time.  Discussed lab results, imagine results, and treatment plan with the patient.  He reports understanding and no other concerns at this time and requesting food and to go home.  Patient is stable for discharge at this time.    Meds given in ED:  Medications  labetalol (NORMODYNE,TRANDATE) injection 10 mg (10 mg Intravenous Given 06/18/13 0654)  lisinopril (PRINIVIL,ZESTRIL) tablet 20  mg (20 mg Oral Given 06/18/13 1201)    Discharge Medication List as of 06/18/2013 12:35 PM    START taking these medications   Details  carvedilol (COREG) 6.25 MG tablet Take 1 tablet (6.25 mg total) by mouth 2 (two) times daily with a meal., Starting 06/18/2013, Until Discontinued, Print          Lorrine Kin, PA-C 06/21/13 1148   Medical screening examination/treatment/procedure(s) were conducted as a shared visit with non-physician practitioner(s) and myself.  I personally evaluated the patient during the  encounter.  EKG Interpretation    Date/Time:    Ventricular Rate:  100 PR Interval:  217 QRS Duration: 82 QT Interval:  367 QTC Calculation: 473 R Axis:   33 Text Interpretation:  Sinus tachycardia Prolonged PR interval Left atrial enlargement Probable left ventricular hypertrophy Borderline T abnormalities, inferior leads            PT comes in with cc of palpitations. PT had HR of 170 per EMS, and they showed [place] the rhythm strip with adenosine provision that corrected the patient to sinus rhythm. Pt now comfortable. He had some chest tightness and dizziness when having palpitations. Will monitor in the ED for a while. Pt on beta blocker, advised to see his PCP if he has further palpitations for med optimization. Blood work in the ED is normal.  Varney Biles, MD 06/26/13 1536

## 2013-06-18 NOTE — ED Notes (Signed)
Pt. Only complaint at this time is lightheadedness. Denies chest pain.

## 2013-06-18 NOTE — ED Notes (Signed)
Report received, assumed care.  

## 2013-09-07 ENCOUNTER — Encounter (HOSPITAL_COMMUNITY): Payer: Self-pay | Admitting: Emergency Medicine

## 2013-09-07 ENCOUNTER — Emergency Department (HOSPITAL_COMMUNITY)
Admission: EM | Admit: 2013-09-07 | Discharge: 2013-09-07 | Disposition: A | Discharge status: Home / Self Care | Payer: Medicare Other | Attending: Emergency Medicine | Admitting: Emergency Medicine

## 2013-09-07 DIAGNOSIS — Z8639 Personal history of other endocrine, nutritional and metabolic disease: Secondary | ICD-10-CM | POA: Insufficient documentation

## 2013-09-07 DIAGNOSIS — I129 Hypertensive chronic kidney disease with stage 1 through stage 4 chronic kidney disease, or unspecified chronic kidney disease: Secondary | ICD-10-CM | POA: Insufficient documentation

## 2013-09-07 DIAGNOSIS — Z79899 Other long term (current) drug therapy: Secondary | ICD-10-CM | POA: Insufficient documentation

## 2013-09-07 DIAGNOSIS — M129 Arthropathy, unspecified: Secondary | ICD-10-CM | POA: Insufficient documentation

## 2013-09-07 DIAGNOSIS — Z8669 Personal history of other diseases of the nervous system and sense organs: Secondary | ICD-10-CM | POA: Insufficient documentation

## 2013-09-07 DIAGNOSIS — N189 Chronic kidney disease, unspecified: Secondary | ICD-10-CM | POA: Insufficient documentation

## 2013-09-07 DIAGNOSIS — Z8719 Personal history of other diseases of the digestive system: Secondary | ICD-10-CM | POA: Insufficient documentation

## 2013-09-07 DIAGNOSIS — H538 Other visual disturbances: Secondary | ICD-10-CM | POA: Insufficient documentation

## 2013-09-07 DIAGNOSIS — I10 Essential (primary) hypertension: Secondary | ICD-10-CM

## 2013-09-07 DIAGNOSIS — R51 Headache: Secondary | ICD-10-CM | POA: Insufficient documentation

## 2013-09-07 DIAGNOSIS — H5710 Ocular pain, unspecified eye: Secondary | ICD-10-CM | POA: Insufficient documentation

## 2013-09-07 DIAGNOSIS — Z7982 Long term (current) use of aspirin: Secondary | ICD-10-CM | POA: Insufficient documentation

## 2013-09-07 DIAGNOSIS — Z862 Personal history of diseases of the blood and blood-forming organs and certain disorders involving the immune mechanism: Secondary | ICD-10-CM | POA: Insufficient documentation

## 2013-09-07 DIAGNOSIS — R519 Headache, unspecified: Secondary | ICD-10-CM

## 2013-09-07 LAB — BASIC METABOLIC PANEL
BUN: 14 mg/dL (ref 6–23)
CO2: 25 mEq/L (ref 19–32)
Calcium: 8.5 mg/dL (ref 8.4–10.5)
Chloride: 108 mEq/L (ref 96–112)
Creatinine, Ser: 1.4 mg/dL — ABNORMAL HIGH (ref 0.50–1.35)
GFR calc Af Amer: 55 mL/min — ABNORMAL LOW (ref >90)
GFR calc non Af Amer: 47 mL/min — ABNORMAL LOW (ref >90)
Glucose, Bld: 112 mg/dL — ABNORMAL HIGH (ref 70–99)
Potassium: 4.4 mEq/L (ref 3.7–5.3)
Sodium: 145 mEq/L (ref 137–147)

## 2013-09-07 MED ORDER — CARVEDILOL 6.25 MG PO TABS: 6.2500 mg | ORAL_TABLET | Freq: Two times a day (BID) | ORAL | Status: DC

## 2013-09-07 MED ORDER — HYDROCODONE-ACETAMINOPHEN 5-325 MG PO TABS
1.0000 | ORAL_TABLET | Freq: Once | ORAL | Status: AC
  Administered 2013-09-07: 1 via ORAL
  Filled 2013-09-07: qty 1

## 2013-09-07 MED ORDER — CARVEDILOL 6.25 MG PO TABS
6.2500 mg | ORAL_TABLET | Freq: Once | ORAL | Status: AC
  Administered 2013-09-07: 6.25 mg via ORAL
  Filled 2013-09-07: qty 1

## 2013-09-07 MED ORDER — ROSUVASTATIN CALCIUM 5 MG PO TABS: 5.0000 mg | ORAL_TABLET | Freq: Every day | ORAL | Status: DC

## 2013-09-07 MED ORDER — TETRACAINE HCL 0.5 % OP SOLN
2.0000 [drp] | Freq: Once | OPHTHALMIC | Status: AC
  Administered 2013-09-07: 2 [drp] via OPHTHALMIC
  Filled 2013-09-07: qty 2

## 2013-09-07 NOTE — ED Notes (Signed)
Updated  Pt.on plan of care.

## 2013-09-07 NOTE — ED Notes (Signed)
Ambulated pt. To discharge gait steady, pt.s brother driving him home

## 2013-09-07 NOTE — ED Notes (Signed)
Spoke with case management about checking on medication discounts for pt.  He is having difficulty with buying his medications

## 2013-09-07 NOTE — ED Provider Notes (Signed)
Medical screening examination/treatment/procedure(s) were performed by non-physician practitioner and as supervising physician I was immediately available for consultation/collaboration.  EKG Interpretation   None         Dakoda Laventure B. Karle Starch, MD 09/07/13 1020

## 2013-09-07 NOTE — ED Notes (Signed)
Pt. Having a headache since yesterday, located in the lt. Eye.   Pt. Has not taken his BP meds in the last 3 days.  All of his meds need refilled.  Denies any n/v/d  Denies any chest pain.  No neuro deficits noted. Ambulated with steady gate.  Blurred vision in lt. Eye. Pt. Is alert and oriented X4

## 2013-09-07 NOTE — Discharge Instructions (Signed)
Arterial Hypertension °Arterial hypertension (high blood pressure) is a condition of elevated pressure in your blood vessels. Hypertension over a long period of time is a risk factor for strokes, heart attacks, and heart failure. It is also the leading cause of kidney (renal) failure.  °CAUSES  °· In Adults -- Over 90% of all hypertension has no known cause. This is called essential or primary hypertension. In the other 10% of people with hypertension, the increase in blood pressure is caused by another disorder. This is called secondary hypertension. Important causes of secondary hypertension are: °· Heavy alcohol use. °· Obstructive sleep apnea. °· Hyperaldosterosim (Conn's syndrome). °· Steroid use. °· Chronic kidney failure. °· Hyperparathyroidism. °· Medications. °· Renal artery stenosis. °· Pheochromocytoma. °· Cushing's disease. °· Coarctation of the aorta. °· Scleroderma renal crisis. °· Licorice (in excessive amounts). °· Drugs (cocaine, methamphetamine). °Your caregiver can explain any items above that apply to you. °· In Children -- Secondary hypertension is more common and should always be considered. °· Pregnancy -- Few women of childbearing age have high blood pressure. However, up to 10% of them develop hypertension of pregnancy. Generally, this will not harm the woman. It may be a sign of 3 complications of pregnancy: preeclampsia, HELLP syndrome, and eclampsia. Follow up and control with medication is necessary. °SYMPTOMS  °· This condition normally does not produce any noticeable symptoms. It is usually found during a routine exam. °· Malignant hypertension is a late problem of high blood pressure. It may have the following symptoms: °· Headaches. °· Blurred vision. °· End-organ damage (this means your kidneys, heart, lungs, and other organs are being damaged). °· Stressful situations can increase the blood pressure. If a person with normal blood pressure has their blood pressure go up while being  seen by their caregiver, this is often termed "white coat hypertension." Its importance is not known. It may be related with eventually developing hypertension or complications of hypertension. °· Hypertension is often confused with mental tension, stress, and anxiety. °DIAGNOSIS  °The diagnosis is made by 3 separate blood pressure measurements. They are taken at least 1 week apart from each other. If there is organ damage from hypertension, the diagnosis may be made without repeat measurements. °Hypertension is usually identified by having blood pressure readings: °· Above 140/90 mmHg measured in both arms, at 3 separate times, over a couple weeks. °· Over 130/80 mmHg should be considered a risk factor and may require treatment in patients with diabetes. °Blood pressure readings over 120/80 mmHg are called "pre-hypertension" even in non-diabetic patients. °To get a true blood pressure measurement, use the following guidelines. Be aware of the factors that can alter blood pressure readings. °· Take measurements at least 1 hour after caffeine. °· Take measurements 30 minutes after smoking and without any stress. This is another reason to quit smoking  it raises your blood pressure. °· Use a proper cuff size. Ask your caregiver if you are not sure about your cuff size. °· Most home blood pressure cuffs are automatic. They will measure systolic and diastolic pressures. The systolic pressure is the pressure reading at the start of sounds. Diastolic pressure is the pressure at which the sounds disappear. If you are elderly, measure pressures in multiple postures. Try sitting, lying or standing. °· Sit at rest for a minimum of 5 minutes before taking measurements. °· You should not be on any medications like decongestants. These are found in many cold medications. °· Record your blood pressure readings and review   them with your caregiver. °If you have hypertension: °· Your caregiver may do tests to be sure you do not have  secondary hypertension (see "causes" above). °· Your caregiver may also look for signs of metabolic syndrome. This is also called Syndrome X or Insulin Resistance Syndrome. You may have this syndrome if you have type 2 diabetes, abdominal obesity, and abnormal blood lipids in addition to hypertension. °· Your caregiver will take your medical and family history and perform a physical exam. °· Diagnostic tests may include blood tests (for glucose, cholesterol, potassium, and kidney function), a urinalysis, or an EKG. Other tests may also be necessary depending on your condition. °PREVENTION  °There are important lifestyle issues that you can adopt to reduce your chance of developing hypertension: °· Maintain a normal weight. °· Limit the amount of salt (sodium) in your diet. °· Exercise often. °· Limit alcohol intake. °· Get enough potassium in your diet. Discuss specific advice with your caregiver. °· Follow a DASH diet (dietary approaches to stop hypertension). This diet is rich in fruits, vegetables, and low-fat dairy products, and avoids certain fats. °PROGNOSIS  °Essential hypertension cannot be cured. Lifestyle changes and medical treatment can lower blood pressure and reduce complications. The prognosis of secondary hypertension depends on the underlying cause. Many people whose hypertension is controlled with medicine or lifestyle changes can live a normal, healthy life.  °RISKS AND COMPLICATIONS  °While high blood pressure alone is not an illness, it often requires treatment due to its short- and long-term effects on many organs. Hypertension increases your risk for: °· CVAs or strokes (cerebrovascular accident). °· Heart failure due to chronically high blood pressure (hypertensive cardiomyopathy). °· Heart attack (myocardial infarction). °· Damage to the retina (hypertensive retinopathy). °· Kidney failure (hypertensive nephropathy). °Your caregiver can explain list items above that apply to you. Treatment  of hypertension can significantly reduce the risk of complications. °TREATMENT  °· For overweight patients, weight loss and regular exercise are recommended. Physical fitness lowers blood pressure. °· Mild hypertension is usually treated with diet and exercise. A diet rich in fruits and vegetables, fat-free dairy products, and foods low in fat and salt (sodium) can help lower blood pressure. Decreasing salt intake decreases blood pressure in a 1/3 of people. °· Stop smoking if you are a smoker. °The steps above are highly effective in reducing blood pressure. While these actions are easy to suggest, they are difficult to achieve. Most patients with moderate or severe hypertension end up requiring medications to bring their blood pressure down to a normal level. There are several classes of medications for treatment. Blood pressure pills (antihypertensives) will lower blood pressure by their different actions. Lowering the blood pressure by 10 mmHg may decrease the risk of complications by as much as 25%. °The goal of treatment is effective blood pressure control. This will reduce your risk for complications. Your caregiver will help you determine the best treatment for you according to your lifestyle. What is excellent treatment for one person, may not be for you. °HOME CARE INSTRUCTIONS  °· Do not smoke. °· Follow the lifestyle changes outlined in the "Prevention" section. °· If you are on medications, follow the directions carefully. Blood pressure medications must be taken as prescribed. Skipping doses reduces their benefit. It also puts you at risk for problems. °· Follow up with your caregiver, as directed. °· If you are asked to monitor your blood pressure at home, follow the guidelines in the "Diagnosis" section above. °SEEK MEDICAL CARE   IF:  °· You think you are having medication side effects. °· You have recurrent headaches or lightheadedness. °· You have swelling in your ankles. °· You have trouble with  your vision. °SEEK IMMEDIATE MEDICAL CARE IF:  °· You have sudden onset of chest pain or pressure, difficulty breathing, or other symptoms of a heart attack. °· You have a severe headache. °· You have symptoms of a stroke (such as sudden weakness, difficulty speaking, difficulty walking). °MAKE SURE YOU:  °· Understand these instructions. °· Will watch your condition. °· Will get help right away if you are not doing well or get worse. °Document Released: 07/27/2005 Document Revised: 10/19/2011 Document Reviewed: 02/24/2007 °ExitCare® Patient Information ©2014 [employer]. ° °

## 2013-09-07 NOTE — ED Notes (Signed)
D/c I/v 

## 2013-09-07 NOTE — ED Provider Notes (Signed)
CSN: [id number]     Arrival date & time 09/07/13  [id number] History   First MD Initiated Contact with Patient 09/07/13 [phone]     Chief Complaint  Patient presents with  . Headache   (Consider location/radiation/quality/duration/timing/severity/associated sxs/prior Treatment) Patient is a 79 y.o. male presenting with headaches. The history is provided by the patient. No language interpreter was used.  Headache Pain location:  Frontal Radiates to:  Does not radiate Similar to prior headaches: yes   Associated symptoms: no abdominal pain, no cough, no fever, no nausea, no neck pain and no sinus pressure   Associated symptoms comment:  Headache that started yesterday that is constant and progressive. He denies nausea, dizziness, fever, sinus congestion. He has had similar headaches in the past. He does have a history of glaucoma but reports no change in his chronic, everyday symptoms of left eye blindness and pain, and right blurry vision. He has not taken his blood pressure medications in 3 days.    Past Medical History  Diagnosis Date  . Poor circulation   . Hernia     umbilical  . Gout   . Glaucoma   . Hypertension     does not see a cardiologist  . Arthritis   . Chronic kidney disease   . Headache(784.0)     " WHEN MY BLOOD PRESSURE IS UP"   Past Surgical History  Procedure Laterality Date  . Hernia repair    . Ventral hernia repair  03/07/2012    Procedure: LAPAROSCOPIC VENTRAL HERNIA;  Surgeon: Gwenyth Ober, MD;  Location: [place];  Service: General;  Laterality: N/A;  . Appendectomy    . Cataract extraction extracapsular Right 05/17/2013    Procedure: CATARACT EXTRACTION EXTRACAPSULAR WITH INTRAOCULAR LENS PLACEMENT (IOC) RIGHT EYE;  Surgeon: Marylynn Pearson, MD;  Location: [place];  Service: Ophthalmology;  Laterality: Right;   No family history on file. History  Substance Use Topics  . Smoking status: Never Smoker   . Smokeless tobacco: Never Used  . Alcohol Use: 0.6 oz/week    1  Cans of beer per week     Comment: occassional    Review of Systems  Constitutional: Negative for fever.  HENT: Negative for sinus pressure.   Eyes:       No change in chronic ophthalmic symptoms.   Respiratory: Negative for cough and shortness of breath.   Cardiovascular: Negative for chest pain.  Gastrointestinal: Negative for nausea and abdominal pain.  Musculoskeletal: Negative for neck pain.  Neurological: Positive for headaches.    Allergies  Review of patient's allergies indicates no known allergies.  Home Medications   Current Outpatient Rx  Name  Route  Sig  Dispense  Refill  . aspirin EC 81 MG EC tablet   Oral   Take 1 tablet (81 mg total) by mouth daily.         . carvedilol (COREG) 6.25 MG tablet   Oral   Take 1 tablet (6.25 mg total) by mouth 2 (two) times daily with a meal.   30 tablet   0   . rosuvastatin (CRESTOR) 5 MG tablet   Oral   Take 5 mg by mouth daily.           BP 133/100  Pulse 92  Temp(Src) 97.5 F (36.4 C) (Oral)  Resp 18  SpO2 99% Physical Exam  Constitutional: He is oriented to person, place, and time. He appears well-developed and well-nourished.  HENT:  Head: Normocephalic and atraumatic.  Eyes: Conjunctivae and EOM are normal.  Fundoscopic exam:      The right eye shows red reflex.       The left eye shows no red reflex.  Slit lamp exam:      The right eye shows no hyphema.       The left eye shows no hyphema.  Pupils are sluggish, minimal reactivity in left eye. No hyphema. Bilateral arcus senilis.  Ocular pressure right: 21, 24 Ocular pressure left: 24, 27  Neck: Normal range of motion.  Cardiovascular: Normal rate and regular rhythm.   No murmur heard. Pulmonary/Chest: Effort normal and breath sounds normal. He has no wheezes. He has no rales.  Abdominal: Soft. There is no tenderness.  Neurological: He is alert and oriented to person, place, and time. He has normal strength and normal reflexes. No sensory deficit.  He displays a negative Romberg sign. Coordination normal.  Skin: Skin is warm and dry.    ED Course  Procedures (including critical care time) Labs Review Labs Reviewed  BASIC METABOLIC PANEL   Results for orders placed during the hospital encounter of [id number]  BASIC METABOLIC PANEL      Result Value Range   Sodium 145  137 - 147 mEq/L   Potassium 4.4  3.7 - 5.3 mEq/L   Chloride 108  96 - 112 mEq/L   CO2 25  19 - 32 mEq/L   Glucose, Bld 112 (*) 70 - 99 mg/dL   BUN 14  6 - 23 mg/dL   Creatinine, Ser 1.40 (*) 0.50 - 1.35 mg/dL   Calcium 8.5  8.4 - 10.5 mg/dL   GFR calc non Af Amer 47 (*) >90 mL/min   GFR calc Af Amer 55 (*) >90 mL/min    Imaging Review No results found.  EKG Interpretation   None       MDM  No diagnosis found. 1. Headache 2. Hypertension  Non-focal neurologic exam. Blood pressure elevated, which patient feels has caused headaches in the past. Do not feel patient's chronic visual changes and eye pain are contributory.    Dewaine Oats, PA-C 09/07/13 1017

## 2013-09-07 NOTE — ED Notes (Signed)
[place] given to pt.s family

## 2013-09-07 NOTE — Progress Notes (Signed)
   CARE MANAGEMENT ED NOTE 09/07/2013  Patient:  Trevor Murphy, Trevor Murphy   Account Number:  [id number]  Date Initiated:  09/07/2013  Documentation initiated by:  Jackelyn Poling  Subjective/Objective Assessment:   79 yr old male [employer] medicare complete [place] resident  c/o headache, elevated bp     Subjective/Objective Assessment Detail:   ED RN reports pt states he has not had his medications in 3 days ED RN assisted with informing pt of [facility] for $3.99 cost & referred to his pcp who is not listed in epic     Action/Plan:   Action/Plan Detail:   ED CM consulted by ED RN Cecille Rubin about pt inquiry about medication assistance for coreg (Carvedilol) and crestor (Rosuvastatin) Cm left Pt a voice message requesting a return call to CM if further medication resources are needed Left CM office#   Anticipated DC Date:  09/07/2013     Status Recommendation to Physician:   Result of Recommendation:    Other ED Services  Consult Working [place]  Other  Outpatient Services - Pt will follow up    Choice offered to / List presented to:            Status of service:  Completed, signed off  ED Comments:   ED Comments Detail:

## 2013-09-07 NOTE — ED Notes (Signed)
Reported to Anderson Malta, PA pt. Elevated BP , orders received

## 2013-10-27 ENCOUNTER — Emergency Department (HOSPITAL_COMMUNITY)
Admission: EM | Admit: 2013-10-27 | Discharge: 2013-10-27 | Disposition: A | Discharge status: Home / Self Care | Payer: Medicare Other | Attending: Emergency Medicine | Admitting: Emergency Medicine

## 2013-10-27 ENCOUNTER — Encounter (HOSPITAL_COMMUNITY): Payer: Self-pay | Admitting: Emergency Medicine

## 2013-10-27 ENCOUNTER — Emergency Department (HOSPITAL_COMMUNITY): Payer: Medicare Other

## 2013-10-27 DIAGNOSIS — R51 Headache: Secondary | ICD-10-CM | POA: Insufficient documentation

## 2013-10-27 DIAGNOSIS — N189 Chronic kidney disease, unspecified: Secondary | ICD-10-CM

## 2013-10-27 DIAGNOSIS — Z91148 Patient's other noncompliance with medication regimen for other reason: Secondary | ICD-10-CM

## 2013-10-27 DIAGNOSIS — I129 Hypertensive chronic kidney disease with stage 1 through stage 4 chronic kidney disease, or unspecified chronic kidney disease: Secondary | ICD-10-CM | POA: Insufficient documentation

## 2013-10-27 DIAGNOSIS — I471 Supraventricular tachycardia, unspecified: Secondary | ICD-10-CM

## 2013-10-27 DIAGNOSIS — N179 Acute kidney failure, unspecified: Secondary | ICD-10-CM | POA: Insufficient documentation

## 2013-10-27 DIAGNOSIS — Z9119 Patient's noncompliance with other medical treatment and regimen: Secondary | ICD-10-CM | POA: Insufficient documentation

## 2013-10-27 DIAGNOSIS — Z8669 Personal history of other diseases of the nervous system and sense organs: Secondary | ICD-10-CM | POA: Insufficient documentation

## 2013-10-27 DIAGNOSIS — Z7982 Long term (current) use of aspirin: Secondary | ICD-10-CM | POA: Insufficient documentation

## 2013-10-27 DIAGNOSIS — Z79899 Other long term (current) drug therapy: Secondary | ICD-10-CM | POA: Insufficient documentation

## 2013-10-27 DIAGNOSIS — I498 Other specified cardiac arrhythmias: Secondary | ICD-10-CM | POA: Insufficient documentation

## 2013-10-27 DIAGNOSIS — Z9114 Patient's other noncompliance with medication regimen: Secondary | ICD-10-CM

## 2013-10-27 DIAGNOSIS — Z8719 Personal history of other diseases of the digestive system: Secondary | ICD-10-CM | POA: Insufficient documentation

## 2013-10-27 DIAGNOSIS — R42 Dizziness and giddiness: Secondary | ICD-10-CM | POA: Insufficient documentation

## 2013-10-27 DIAGNOSIS — Z91199 Patient's noncompliance with other medical treatment and regimen due to unspecified reason: Secondary | ICD-10-CM | POA: Insufficient documentation

## 2013-10-27 DIAGNOSIS — M109 Gout, unspecified: Secondary | ICD-10-CM | POA: Insufficient documentation

## 2013-10-27 LAB — CBC
HCT: 50.6 % (ref 39.0–52.0)
Hemoglobin: 17.2 g/dL — ABNORMAL HIGH (ref 13.0–17.0)
MCH: 28 pg (ref 26.0–34.0)
MCHC: 34 g/dL (ref 30.0–36.0)
MCV: 82.3 fL (ref 78.0–100.0)
Platelets: 205 10*3/uL (ref 150–400)
RBC: 6.15 MIL/uL — ABNORMAL HIGH (ref 4.22–5.81)
RDW: 17.1 % — ABNORMAL HIGH (ref 11.5–15.5)
WBC: 4.1 10*3/uL (ref 4.0–10.5)

## 2013-10-27 LAB — I-STAT CHEM 8, ED
BUN: 25 mg/dL — ABNORMAL HIGH (ref 6–23)
Calcium, Ion: 1.21 mmol/L (ref 1.13–1.30)
Chloride: 104 mEq/L (ref 96–112)
Creatinine, Ser: 1.8 mg/dL — ABNORMAL HIGH (ref 0.50–1.35)
Glucose, Bld: 104 mg/dL — ABNORMAL HIGH (ref 70–99)
HCT: 55 % — ABNORMAL HIGH (ref 39.0–52.0)
Hemoglobin: 18.7 g/dL — ABNORMAL HIGH (ref 13.0–17.0)
Potassium: 4.4 mEq/L (ref 3.7–5.3)
Sodium: 143 mEq/L (ref 137–147)
TCO2: 26 mmol/L (ref 0–100)

## 2013-10-27 LAB — PRO B NATRIURETIC PEPTIDE: Pro B Natriuretic peptide (BNP): 92.8 pg/mL (ref 0–450)

## 2013-10-27 LAB — I-STAT TROPONIN, ED: Troponin i, poc: 0.02 ng/mL (ref 0.00–0.08)

## 2013-10-27 MED ORDER — ASPIRIN 81 MG PO TBEC: 81.0000 mg | DELAYED_RELEASE_TABLET | Freq: Every day | ORAL | Status: DC

## 2013-10-27 MED ORDER — ADENOSINE 6 MG/2ML IV SOLN
6.0000 mg | Freq: Once | INTRAVENOUS | Status: AC
  Administered 2013-10-27: 6 mg via INTRAVENOUS

## 2013-10-27 MED ORDER — ROSUVASTATIN CALCIUM 5 MG PO TABS: 5.0000 mg | ORAL_TABLET | Freq: Every day | ORAL | Status: DC

## 2013-10-27 MED ORDER — METOPROLOL TARTRATE 1 MG/ML IV SOLN
5.0000 mg | Freq: Once | INTRAVENOUS | Status: AC
  Administered 2013-10-27: 5 mg via INTRAVENOUS
  Filled 2013-10-27: qty 5

## 2013-10-27 MED ORDER — CARVEDILOL 6.25 MG PO TABS: 6.2500 mg | ORAL_TABLET | Freq: Two times a day (BID) | ORAL | Status: DC

## 2013-10-27 MED ORDER — ADENOSINE 6 MG/2ML IV SOLN
INTRAVENOUS | Status: AC
  Filled 2013-10-27: qty 2

## 2013-10-27 NOTE — ED Notes (Signed)
This RN spoke with social work to see if they could do a follow up at home due to pt reporting increase stress while taking care of spouse and himself, pt stated "I'm always taking care of my wife, sometimes I'm just too tired to take care of me."

## 2013-10-27 NOTE — ED Notes (Addendum)
Pt to ED via [facility], pt from home, pt c/o headache x2 days, began having palpitations 1 hour ago, pt reports a hx of the same. VS BP 142/104, HR 145, CBG 148

## 2013-10-27 NOTE — ED Notes (Addendum)
Pt presents with dizziness, headache, and elevated BP x2. Pt states his daughter checked his BP yesterday and it was "100's over something." Pt denies taking his prescribed HTN medication, states he has been staying with his wife in the hospital, and was out oh his prescription. States his Cardiologist gave him some samples to take. Pt was scheduled to follow up with his Cardiologist today. Pt states he took his last HTN medication last Thursday. Pt states "every time I turn my head I get dizzy and can't see anything, it feels like its bleeding up there."  Pt also reports Left side chest pain, SOB, and palpitations, pt states he took 2 Aspirin and the pain went away

## 2013-10-27 NOTE — ED Notes (Signed)
Pt reports he takes his spouse to dialysis 3 days a week, and them comes here to the ED with his spouse several times a month for her End Stage Renal failure. Pt states "it puts a lot of stress on me and I'm just give out and can barely take care of myself." States his daughter will not help him with his spouse and his spouse does not like for him to leave her alone at dialysis.

## 2013-10-27 NOTE — ED Notes (Signed)
Pt discharged home with all belongings, pt alert and ambulatory upon discharge, 3 new rx prescribed, pt verbalizes understanding of discharge instructions, pt's brother will pick pt up from waiting area

## 2013-10-27 NOTE — ED Provider Notes (Signed)
CSN: [id number]     Arrival date & time 10/27/13  1410 History   First MD Initiated Contact with Patient 10/27/13 1427     Chief Complaint  Patient presents with  . Headache  . Tachycardia     (Consider location/radiation/quality/duration/timing/severity/associated sxs/prior Treatment) Patient is a 79 y.o. male presenting with headaches. The history is provided by the patient.  Headache Associated symptoms: no abdominal pain, no back pain, no diarrhea, no pain, no nausea, no neck stiffness, no numbness and no vomiting    patient states he has had a headache for the last few days. He states his been off his medicine for least a month. He states his been taking care of his sick wife and hasn't been able to take care of himself. He states he gets this kind of headache when his blood pressure goes up. He states that when he turns his head he feels dizzy. He also states her on an hour before arrival while his bowel movement he felt his heart surgeries. He states he feels little lightheaded with the racing. He states he's had the same thing previously and the EMS gave him something for the IV to make it go away. No chest pain. No numbness or weakness. No nausea or diaphoresis  Past Medical History  Diagnosis Date  . Poor circulation   . Hernia     umbilical  . Gout   . Glaucoma   . Hypertension     does not see a cardiologist  . Arthritis   . Chronic kidney disease   . Headache(784.0)     " WHEN MY BLOOD PRESSURE IS UP"   Past Surgical History  Procedure Laterality Date  . Hernia repair    . Ventral hernia repair  03/07/2012    Procedure: LAPAROSCOPIC VENTRAL HERNIA;  Surgeon: Gwenyth Ober, MD;  Location: [facility];  Service: General;  Laterality: N/A;  . Appendectomy    . Cataract extraction extracapsular Right 05/17/2013    Procedure: CATARACT EXTRACTION EXTRACAPSULAR WITH INTRAOCULAR LENS PLACEMENT (IOC) RIGHT EYE;  Surgeon: Marylynn Pearson, MD;  Location: [facility];  Service: Ophthalmology;   Laterality: Right;   History reviewed. No pertinent family history. History  Substance Use Topics  . Smoking status: Never Smoker   . Smokeless tobacco: Never Used  . Alcohol Use: 0.6 oz/week    1 Cans of beer per week     Comment: occassional    Review of Systems  Constitutional: Negative for activity change and appetite change.  Eyes: Negative for pain.  Respiratory: Negative for chest tightness and shortness of breath.   Cardiovascular: Negative for chest pain and leg swelling.       Tachycardia  Gastrointestinal: Negative for nausea, vomiting, abdominal pain and diarrhea.  Genitourinary: Negative for flank pain.  Musculoskeletal: Negative for back pain and neck stiffness.  Skin: Negative for rash.  Neurological: Positive for light-headedness and headaches. Negative for weakness and numbness.  Psychiatric/Behavioral: Negative for behavioral problems.      Allergies  Review of patient's allergies indicates no known allergies.  Home Medications   Current Outpatient Rx  Name  Route  Sig  Dispense  Refill  . aspirin 81 MG EC tablet   Oral   Take 1 tablet (81 mg total) by mouth daily.   30 tablet   0   . carvedilol (COREG) 6.25 MG tablet   Oral   Take 1 tablet (6.25 mg total) by mouth 2 (two) times daily with a meal.  60 tablet   0   . rosuvastatin (CRESTOR) 5 MG tablet   Oral   Take 1 tablet (5 mg total) by mouth daily.   30 tablet   0    BP 133/81  Pulse 58  Temp(Src) 97.8 F (36.6 C) (Oral)  Resp 10  SpO2 100% Physical Exam  Nursing note and vitals reviewed. Constitutional: He is oriented to person, place, and time. He appears well-developed and well-nourished.  HENT:  Head: Normocephalic and atraumatic.  Eyes: EOM are normal. Pupils are equal, round, and reactive to light.  Neck: Normal range of motion. Neck supple.  Cardiovascular: Regular rhythm and normal heart sounds.   No murmur heard. Tachycardia  Pulmonary/Chest: Effort normal and breath  sounds normal.  Abdominal: Soft. Bowel sounds are normal. He exhibits no distension and no mass. There is no tenderness. There is no rebound and no guarding.  Musculoskeletal: Normal range of motion. He exhibits no edema.  Neurological: He is alert and oriented to person, place, and time. No cranial nerve deficit.  Skin: Skin is warm and dry.  Psychiatric: He has a normal mood and affect.    ED Course  Procedures (including critical care time) Labs Review Labs Reviewed  CBC - Abnormal; Notable for the following:    RBC 6.15 (*)    Hemoglobin 17.2 (*)    RDW 17.1 (*)    All other components within normal limits  I-STAT CHEM 8, ED - Abnormal; Notable for the following:    BUN 25 (*)    Creatinine, Ser 1.80 (*)    Glucose, Bld 104 (*)    Hemoglobin 18.7 (*)    HCT 55.0 (*)    All other components within normal limits  PRO B NATRIURETIC PEPTIDE  I-STAT TROPOININ, ED   Imaging Review Dg Chest Port 1 View  10/27/2013   CLINICAL DATA:  Tachycardia  EXAM: PORTABLE CHEST - 1 VIEW  COMPARISON:  06/18/2013  FINDINGS: Cardiac shadow is within normal limits. The lungs are well aerated bilaterally. No focal infiltrate or sizable effusion is seen. No bony abnormality is noted.  IMPRESSION: No active disease.   Electronically Signed   By: Inez Catalina M.D.   On: 10/27/2013 14:43     EKG Interpretation   Date/Time:  Friday October 27 2013 14:22:52 EDT Ventricular Rate:  153 PR Interval:    QRS Duration: 92 QT Interval:  306 QTC Calculation: 488 R Axis:   40 Text Interpretation:  Supraventricular tachycardia Repolarization  abnormality, prob rate related Confirmed by Alvino Chapel  MD, Ovid Curd [phone])  on 10/27/2013 3:42:41 PM      MDM   Final diagnoses:  SVT (supraventricular tachycardia)  Acute on chronic renal failure  Noncompliance with medications    Patient presents feeling bad and with an SVT. He was given adenosine with resolution of the SVT. His been noncompliant with his  medication. He feels much better after conversion with the adenosine. Laboratory was worsening of his chronic renal insufficiency. He will need to followup with his primary care Dr. He has been taking poor care of himself due to a sick wife. His been seen by social work to help arrange for followup.  Patient was given 6 mg of IV adenosine. He conversion back to sinus rhythm.      Jasper Riling. Alvino Chapel, MD 10/27/13 [phone]

## 2013-10-27 NOTE — Discharge Instructions (Signed)
Basics of Medication Management UNDERSTAND YOUR MEDICATIONS  Read all of the labels and inserts that come with your medications. Review the information on this form often.  Know what potential side effects to look for (for each medication).  Know what each of your medications look like (by color, shape, size, stamp). If you are getting confused and having a hard time telling them apart, talk with your caregiver or pharmacist. They may be able to change the medication or help you to identify them more easily.  Check with your pharmacist if you notice a difference in the size or color of your medication.  Get all of your medications at one pharmacy. The pharmacist will have all of your information and understand possible drug interactions.  Ask your caregiver questions about your prescriptions and any over-the-counter medications, vitamins, herbal or dietary supplements that you take. TAKE YOUR MEDICATION SAFELY  Take medications only as prescribed.  Talk with your caregiver or pharmacist if some of your pills look the same and it is difficult to tell them apart. They can help you to recognize different medications.  Never double up on your medication.  Never take anyone else's medication or share your medications.  Do not stop taking your medication(s) unless you have discussed it with your caregiver.  Do not split, mash, or chew medications unless your caregiver tells you to do so. Tell your caregiver if you have trouble swallowing your medication(s).  For liquid medications, make sure you use the dosing container provided to you.  You may need to avoid alcohol or certain foods or liquids with one or more of your medications. Make sure you remember how to take each medication with some of the tools below. ORGANIZE YOUR MEDICATIONS  Use a tool, such as a weekly pill box (available at your local pharmacy), written chart from your caregiver, notebook, binder, or your own calendar to  organize your daily medications. Please note: if you are having trouble telling your different medications apart, keep them in the original bottles.  Set cues or reminders for taking your medications. Use watch alarms, mobile device/phone calendar alarms, or sticky notes.  More advanced medication management systems are also available. These offer weekly or monthly options complete with storage, alarms, and visual and audio prompts.  Your system should help you to keep track of the:  Name of medication and dose.  Day.  Time.  Pill to take (by color, shape, size, or name/imprint).  How to take it (with or without certain foods, on an empty stomach, with fluids etc.).  Review your medication schedule with a family member or friend to help you. Other household members should understand your medications.  If you are taking medications on an "as needed" basis such as those for nausea or pain, write down the name, dose, and time you took the medication so that you remember what you have taken. PLAN AHEAD FOR REFILLS AND TRAVEL  Take your pill box, medications, and calendar system with you when you travel.  Plan ahead for refills as to not run out of your medication(s).  Always carry an updated list of your medications with you. If there is an emergency, a respondent can quickly see what medications you are taking. STORE AND DISCARD YOUR MEDICATIONS SAFELY  Store medications in a cool, dry area away from light. (The bathroom is not a good place for storage because of heat and humidity.)  Store your medications away from chemicals, pet medications, or other family member's  medications.  Keep medications out of children's reach, away from counters and bedside tables. Store them up high in cabinets or shelves.  Check expiration dates regularly.  Learn about the best way to dispose of each medication you take. Find out if your local government recycling program has a Medicine Take Back  program for safe disposal. If not, some medications may be mixed with inedible substances and thrown away in the trash. Certain medications are to be flushed down the toilet. REMEMBER:  Tell your caregiver if you experience side effects, new symptoms, or have other concerns. There may be dosing changes or alternative medications that would be better for you.  Review your medications regularly with your caregiver. Check to see if you need to continue to take each medication, and discuss how well they are working. Medicines, diet, medical conditions, weight changes, and other habits can all affect how medicines work. PEDIATRIC CONSIDERATIONS If you are taking care of an infant or child who needs multiple medications, follow the tips above to organize a medication schedule and safely give and store medications.   Use positive reinforcement (singing, cuddling, reward) for your child to help him or her take necessary medications.  Use only syringes, droppers, dosing spoons, or dosing cups provided by your caregiver or pharmacist.  Always wash your hands before giving medications.  Get to know your child's school medication policies. Meet with the school nurse to review the medication schedule in detail. Never send the medication to school with your child.  Check with your child's caregiver if he or she has trouble taking medication, forgets a dose, or spits it up.  Make sure your child knows how to use an inhaler properly if needed.  Do not give your child over-the-counter cough and cold medicines if they are under 102 years of age.  Avoid giving your child or teenager Aspirin or Aspirin-containing products. Document Released: 11/11/2010 Document Revised: 10/19/2011 Document Reviewed: 11/11/2010 [facility] Patient Information 2014 [employer].  Chronic Kidney Disease Chronic kidney disease occurs when the kidneys are damaged over a long period. The kidneys are two organs that lie on either  side of the spine between the middle of the back and the front of the abdomen. The kidneys:   Remove wastes and extra water from the blood.   Produce important hormones. These help keep bones strong, regulate blood pressure, and help create red blood cells.   Balance the fluids and chemicals in the blood and tissues. A small amount of kidney damage may not cause problems, but a large amount of damage may make it difficult or impossible for the kidneys to work the way they should. If steps are not taken to slow down the kidney damage or stop it from getting worse, the kidneys may stop working permanently. Most of the time, chronic kidney disease does not go away. However, it can often be controlled, and those with the disease can usually live normal lives. CAUSES  The most common causes of chronic kidney disease are diabetes and high blood pressure (hypertension). Chronic kidney disease may also be caused by:   Diseases that cause kidneys' filters to become inflamed.   Diseases that affect the immune system.   Genetic diseases.   Medicines that damage the kidneys, such as anti-inflammatory medicines.  Poisoning or exposure to toxic substances.   A reoccurring kidney or urinary infection.   A problem with urine flow. This may be caused by:   Cancer.   Kidney stones.  An enlarged prostate in males. SYMPTOMS  Because the kidney damage in chronic kidney disease occurs slowly, symptoms develop slowly and may not be obvious until the kidney damage becomes severe. A person may have a kidney disease for years without showing any symptoms. Symptoms can include:   Swelling (edema) of the legs, ankles, or feet.   Tiredness (lethargy).   Nausea or vomiting.   Confusion.   Problems with urination, such as:   Decreased urine production.   Frequent urination, especially at night.   Frequent accidents in children who are potty trained.   Muscle twitches and  cramps.   Shortness of breath.  Weakness.   Persistent itchiness.   Loss of appetite.  Metallic taste in the mouth.  Trouble sleeping.  Slowed development in children.  Short stature in children. DIAGNOSIS  Chronic kidney disease may be detected and diagnosed by tests, including blood, urine, imaging, or kidney biopsy tests.  TREATMENT  Most chronic kidney diseases cannot be cured. Treatment usually involves relieving symptoms and preventing or slowing the progression of the disease. Treatment may include:   A special diet. You may need to avoid alcohol and foods thatare salty and high in potassium.   Medicines. These may:   Lower blood pressure.   Relieve anemia.   Relieve swelling.   Protect the bones. HOME CARE INSTRUCTIONS   Follow your prescribed diet.   Only take over-the-counter or prescription medicines as directed by your caregiver.  Do not take any new medicines (prescription, over-the-counter, or nutritional supplements) unless approved by your caregiver. Many medicines can worsen your kidney damage or need to have the dose adjusted.   Quit smoking if you are a smoker. Talk to your caregiver about a smoking cessation program.   Keep all follow-up appointments as directed by your caregiver. SEEK IMMEDIATE MEDICAL CARE IF:  Your symptoms get worse or you develop new symptoms.   You develop symptoms of end-stage kidney disease. These include:   Headaches.   Abnormally dark or light skin.   Numbness in the hands or feet.   Easy bruising.   Frequent hiccups.   Menstruation stops.   You have a fever.   You have decreased urine production.   You havepain or bleeding when urinating. MAKE SURE YOU:  Understand these instructions.  Will watch your condition.  Will get help right away if you are not doing well or get worse. FOR MORE INFORMATION  American Association of Kidney Patients: BombTimer.gl National Kidney  Foundation: www.kidney.[place]: https://mathis.com/ [facility]: www.lifeoptions.org and www.kidneyschool.org Document Released: 05/05/2008 Document Revised: 07/13/2012 Document Reviewed: 03/25/2012 [facility] Patient Information 2014 [place], [place].  Supraventricular Tachycardia Supraventricular tachycardia (SVT) is an abnormal heart rhythm (arrhythmia) that causes the heart to beat very fast (tachycardia). This kind of fast heartbeat originates in the upper chambers of the heart (atria). SVT can cause the heart to beat greater than 100 beats per minute. SVT can have a rapid burst of heartbeats. This can start and stop suddenly without warning and is called nonsustained. SVT can also be sustained, in which the heart beats at a continuous fast rate.  CAUSES  There can be different causes of SVT. Some of these include:  Heart valve problems such as mitral valve prolapse.  An enlarged heart (hypertrophic cardiomyopathy).  Congenital heart problems.  Heart inflammation (pericarditis).  Hyperthyroidism.  Low potassium or magnesium levels.  Caffeine.  Drug use such as cocaine, methamphetamines, or stimulants.  Some over-the-counter medicines such  as:  Decongestants.  Diet medicines.  Herbal medicines. SYMPTOMS  Symptoms of SVT can vary. Symptoms depend on whether the SVT is sustained or nonsustained. You may experience:  No symptoms (asymptomatic).  An awareness of your heart beating rapidly (palpitations).  Shortness of breath.  Chest pain or pressure. If your blood pressure drops because of the SVT, you may experience:  Fainting or near fainting.  Weakness.  Dizziness. DIAGNOSIS  Different tests can be performed to diagnose SVT, such as:  An electrocardiogram (EKG). This is a painless test that records the electrical activity of your heart.  Holter monitor. This is a 24 hour recording of your heart rhythm. You will be given a  diary. Write down all symptoms that you have and what you were doing at the time you experienced symptoms.  Arrhythmia monitor. This is a small device that your wear for several weeks. It records the heart rhythm when you have symptoms.  Echocardiogram. This is an imaging test to help detect abnormal heart structure such as congenital abnormalities, heart valve problems, or heart enlargement.  Stress test. This test can help determine if the SVT is related to exercise.  Electrophysiology study (EPS). This is a procedure that evaluates your heart's electrical system and can help your caregiver find the cause of your SVT. TREATMENT  Treatment of SVT depends on the symptoms, how often it recurs, and whether there are any underlying heart problems.   If symptoms are rare and no other cardiac disease is present, no treatment may be needed.  Blood work may be done to check potassium, magnesium, and thyroid hormone levels to see if they are abnormal. If these levels are abnormal, treatment to correct the problems will occur. Medicines Your caregiver may use oral medicines to treat SVT. These medicines are given for long-term control of SVT. Medicines may be used alone or in combination with other treatments. These medicines work to slow nerve impulses in the heart muscle. These medicines can also be used to treat high blood pressure. Some of these medicines may include:  Calcium channel blockers.  Beta blockers.  Digoxin. Nonsurgical procedures Nonsurgical techniques may be used if oral medicines do not work. Some examples include:  Cardioversion. This technique uses either drugs or an electrical shock to restore a normal heart rhythm.  Cardioversion drugs may be given through an intravenous (IV) line to help "reset" the heart rhythm.  In electrical cardioversion, the caregiver shocks your heart to stop its beat for a split second. This helps to reset the heart to a normal rhythm.  Ablation.  This procedure is done under mild sedation. High frequency radio wave energy is used to destroy the area of heart tissue responsible for the SVT. HOME CARE INSTRUCTIONS   Do not smoke.  Only take medicines prescribed by your caregiver. Check with your caregiver before using over-the-counter medicines.  Check with your caregiver about how much alcohol and caffeine (coffee, tea, colas, or chocolate) you may have.  It is very important to keep all follow-up referrals and appointments in order to properly manage this problem. SEEK IMMEDIATE MEDICAL CARE IF:  You have dizziness.  You faint or nearly faint.  You have shortness of breath.  You have chest pain or pressure.  You have sudden nausea or vomiting.  You have profuse sweating.  You are concerned about how long your symptoms last.  You are concerned about the frequency of your SVT episodes. If you have the above symptoms, call your local  emergency services (911 in [place] immediately. Do not drive yourself to the hospital. MAKE SURE YOU:   Understand these instructions.  Will watch your condition.  Will get help right away if you are not doing well or get worse. Document Released: 07/27/2005 Document Revised: 10/19/2011 Document Reviewed: 11/08/2008 [facility] Patient Information 2014 [place], [place].

## 2013-10-27 NOTE — ED Notes (Signed)
MD Pickering at bedside during Adenosine administration

## 2013-10-27 NOTE — ED Notes (Signed)
Dr. Alvino Chapel at bedside, code cart outside room, Adenosine 6 mg administered by this RN, pt's heart rate decreased from 150 to 28, HR gradually increased back to 60's. Pt remained alert and oriented x4 through out the procedure, pt did verbalize getting a "funny feeling" when medication administered.

## 2013-10-27 NOTE — ED Notes (Signed)
Social work at bedside.  

## 2013-10-27 NOTE — ED Notes (Signed)
MD Pickering at bedside.  

## 2013-10-31 NOTE — Progress Notes (Signed)
ED CM meet with patient concerning patient not being compliant with medications. Pt presented to [facility] ED with Tachycardia and headache. Pt has had 3 ED visits in the past 6 months, Meeet with patient at bedside He reports that he misses medications  regularly because, he has to take his wife to hemodialysis. Discussed with patient the importance of taking medications, he verbalizes understanding, but he states, "I have to be with my wife, she needs me" Discussed [facility] services patient seems to be interested.Consent given to contact [facility] on patient behalf.  CM will continue to follow.

## 2013-12-16 ENCOUNTER — Encounter (HOSPITAL_COMMUNITY): Payer: Self-pay | Admitting: Emergency Medicine

## 2013-12-16 ENCOUNTER — Inpatient Hospital Stay (HOSPITAL_COMMUNITY)
Admission: EM | Admit: 2013-12-16 | Discharge: 2013-12-21 | DRG: 682 | Disposition: A | Discharge status: Home Health | Payer: Medicare Other | Attending: Internal Medicine | Admitting: Internal Medicine

## 2013-12-16 ENCOUNTER — Emergency Department (HOSPITAL_COMMUNITY): Payer: Medicare Other

## 2013-12-16 DIAGNOSIS — Z79899 Other long term (current) drug therapy: Secondary | ICD-10-CM

## 2013-12-16 DIAGNOSIS — M109 Gout, unspecified: Secondary | ICD-10-CM | POA: Diagnosis present

## 2013-12-16 DIAGNOSIS — I248 Other forms of acute ischemic heart disease: Secondary | ICD-10-CM | POA: Diagnosis present

## 2013-12-16 DIAGNOSIS — R778 Other specified abnormalities of plasma proteins: Secondary | ICD-10-CM

## 2013-12-16 DIAGNOSIS — I160 Hypertensive urgency: Secondary | ICD-10-CM

## 2013-12-16 DIAGNOSIS — H5440 Blindness, one eye, unspecified eye: Secondary | ICD-10-CM | POA: Diagnosis present

## 2013-12-16 DIAGNOSIS — R42 Dizziness and giddiness: Secondary | ICD-10-CM

## 2013-12-16 DIAGNOSIS — N183 Chronic kidney disease, stage 3 unspecified: Secondary | ICD-10-CM | POA: Diagnosis present

## 2013-12-16 DIAGNOSIS — I10 Essential (primary) hypertension: Secondary | ICD-10-CM

## 2013-12-16 DIAGNOSIS — H409 Unspecified glaucoma: Secondary | ICD-10-CM | POA: Diagnosis present

## 2013-12-16 DIAGNOSIS — I471 Supraventricular tachycardia: Secondary | ICD-10-CM

## 2013-12-16 DIAGNOSIS — N189 Chronic kidney disease, unspecified: Secondary | ICD-10-CM

## 2013-12-16 DIAGNOSIS — Z91199 Patient's noncompliance with other medical treatment and regimen due to unspecified reason: Secondary | ICD-10-CM

## 2013-12-16 DIAGNOSIS — R079 Chest pain, unspecified: Secondary | ICD-10-CM

## 2013-12-16 DIAGNOSIS — K432 Incisional hernia without obstruction or gangrene: Secondary | ICD-10-CM

## 2013-12-16 DIAGNOSIS — N179 Acute kidney failure, unspecified: Secondary | ICD-10-CM

## 2013-12-16 DIAGNOSIS — E785 Hyperlipidemia, unspecified: Secondary | ICD-10-CM | POA: Diagnosis present

## 2013-12-16 DIAGNOSIS — I674 Hypertensive encephalopathy: Secondary | ICD-10-CM | POA: Diagnosis present

## 2013-12-16 DIAGNOSIS — Z9849 Cataract extraction status, unspecified eye: Secondary | ICD-10-CM

## 2013-12-16 DIAGNOSIS — M25561 Pain in right knee: Secondary | ICD-10-CM | POA: Diagnosis present

## 2013-12-16 DIAGNOSIS — I080 Rheumatic disorders of both mitral and aortic valves: Secondary | ICD-10-CM | POA: Diagnosis present

## 2013-12-16 DIAGNOSIS — I5033 Acute on chronic diastolic (congestive) heart failure: Secondary | ICD-10-CM | POA: Diagnosis present

## 2013-12-16 DIAGNOSIS — Z8673 Personal history of transient ischemic attack (TIA), and cerebral infarction without residual deficits: Secondary | ICD-10-CM

## 2013-12-16 DIAGNOSIS — Z9119 Patient's noncompliance with other medical treatment and regimen: Secondary | ICD-10-CM

## 2013-12-16 DIAGNOSIS — N1832 Chronic kidney disease, stage 3b: Secondary | ICD-10-CM | POA: Diagnosis present

## 2013-12-16 DIAGNOSIS — M1710 Unilateral primary osteoarthritis, unspecified knee: Secondary | ICD-10-CM | POA: Diagnosis present

## 2013-12-16 DIAGNOSIS — R7989 Other specified abnormal findings of blood chemistry: Secondary | ICD-10-CM

## 2013-12-16 DIAGNOSIS — I639 Cerebral infarction, unspecified: Secondary | ICD-10-CM

## 2013-12-16 DIAGNOSIS — I509 Heart failure, unspecified: Secondary | ICD-10-CM | POA: Diagnosis present

## 2013-12-16 DIAGNOSIS — R0989 Other specified symptoms and signs involving the circulatory and respiratory systems: Secondary | ICD-10-CM

## 2013-12-16 DIAGNOSIS — I6350 Cerebral infarction due to unspecified occlusion or stenosis of unspecified cerebral artery: Secondary | ICD-10-CM

## 2013-12-16 DIAGNOSIS — I169 Hypertensive crisis, unspecified: Secondary | ICD-10-CM

## 2013-12-16 DIAGNOSIS — R112 Nausea with vomiting, unspecified: Secondary | ICD-10-CM

## 2013-12-16 DIAGNOSIS — I129 Hypertensive chronic kidney disease with stage 1 through stage 4 chronic kidney disease, or unspecified chronic kidney disease: Principal | ICD-10-CM | POA: Diagnosis present

## 2013-12-16 DIAGNOSIS — Z09 Encounter for follow-up examination after completed treatment for conditions other than malignant neoplasm: Secondary | ICD-10-CM

## 2013-12-16 DIAGNOSIS — I498 Other specified cardiac arrhythmias: Secondary | ICD-10-CM | POA: Diagnosis present

## 2013-12-16 DIAGNOSIS — I2489 Other forms of acute ischemic heart disease: Secondary | ICD-10-CM | POA: Diagnosis present

## 2013-12-16 LAB — I-STAT TROPONIN, ED
Troponin i, poc: 0 ng/mL (ref 0.00–0.08)
Troponin i, poc: 0.02 ng/mL (ref 0.00–0.08)
Troponin i, poc: 0.02 ng/mL (ref 0.00–0.08)

## 2013-12-16 LAB — BASIC METABOLIC PANEL
BUN: 13 mg/dL (ref 6–23)
CO2: 25 mEq/L (ref 19–32)
Calcium: 8.5 mg/dL (ref 8.4–10.5)
Chloride: 104 mEq/L (ref 96–112)
Creatinine, Ser: 1.39 mg/dL — ABNORMAL HIGH (ref 0.50–1.35)
GFR calc Af Amer: 55 mL/min — ABNORMAL LOW (ref >90)
GFR calc non Af Amer: 48 mL/min — ABNORMAL LOW (ref >90)
Glucose, Bld: 107 mg/dL — ABNORMAL HIGH (ref 70–99)
Potassium: 3.7 mEq/L (ref 3.7–5.3)
Sodium: 142 mEq/L (ref 137–147)

## 2013-12-16 LAB — CBC
HCT: 44.9 % (ref 39.0–52.0)
Hemoglobin: 14.5 g/dL (ref 13.0–17.0)
MCH: 26.5 pg (ref 26.0–34.0)
MCHC: 32.3 g/dL (ref 30.0–36.0)
MCV: 82.1 fL (ref 78.0–100.0)
Platelets: 184 10*3/uL (ref 150–400)
RBC: 5.47 MIL/uL (ref 4.22–5.81)
RDW: 15.9 % — ABNORMAL HIGH (ref 11.5–15.5)
WBC: 4.5 10*3/uL (ref 4.0–10.5)

## 2013-12-16 LAB — URINALYSIS, ROUTINE W REFLEX MICROSCOPIC
Bilirubin Urine: NEGATIVE
Glucose, UA: NEGATIVE mg/dL
Ketones, ur: NEGATIVE mg/dL
Leukocytes, UA: NEGATIVE
Nitrite: NEGATIVE
Protein, ur: 100 mg/dL — AB
Specific Gravity, Urine: 1.019 (ref 1.005–1.030)
Urobilinogen, UA: 1 mg/dL (ref 0.0–1.0)
pH: 6 (ref 5.0–8.0)

## 2013-12-16 LAB — TROPONIN I
Troponin I: <0.3 ng/mL (ref <0.30)
Troponin I: <0.3 ng/mL (ref <0.30)
Troponin I: <0.3 ng/mL (ref <0.30)

## 2013-12-16 LAB — URINE MICROSCOPIC-ADD ON

## 2013-12-16 LAB — PROTIME-INR
INR: 1 (ref 0.00–1.49)
Prothrombin Time: 13 seconds (ref 11.6–15.2)

## 2013-12-16 LAB — APTT: aPTT: 31 seconds (ref 24–37)

## 2013-12-16 LAB — PRO B NATRIURETIC PEPTIDE: Pro B Natriuretic peptide (BNP): 921.3 pg/mL — ABNORMAL HIGH (ref 0–450)

## 2013-12-16 MED ORDER — ONDANSETRON HCL 4 MG/2ML IJ SOLN: 4.0000 mg | Freq: Four times a day (QID) | INTRAMUSCULAR | Status: DC | PRN

## 2013-12-16 MED ORDER — CARVEDILOL 6.25 MG PO TABS
6.2500 mg | ORAL_TABLET | Freq: Once | ORAL | Status: AC
  Administered 2013-12-16: 6.25 mg via ORAL
  Filled 2013-12-16: qty 1

## 2013-12-16 MED ORDER — HYDRALAZINE HCL 50 MG PO TABS
50.0000 mg | ORAL_TABLET | Freq: Three times a day (TID) | ORAL | Status: DC
  Administered 2013-12-16 – 2013-12-17 (×3): 50 mg via ORAL
  Filled 2013-12-16 (×6): qty 1

## 2013-12-16 MED ORDER — AMLODIPINE BESYLATE 10 MG PO TABS
10.0000 mg | ORAL_TABLET | Freq: Every day | ORAL | Status: DC
  Administered 2013-12-16 – 2013-12-21 (×6): 10 mg via ORAL
  Filled 2013-12-16 (×6): qty 1

## 2013-12-16 MED ORDER — ONDANSETRON HCL 4 MG PO TABS: 4.0000 mg | ORAL_TABLET | Freq: Four times a day (QID) | ORAL | Status: DC | PRN

## 2013-12-16 MED ORDER — SODIUM CHLORIDE 0.9 % IJ SOLN
3.0000 mL | Freq: Two times a day (BID) | INTRAMUSCULAR | Status: DC
  Administered 2013-12-16 – 2013-12-20 (×8): 3 mL via INTRAVENOUS

## 2013-12-16 MED ORDER — MORPHINE SULFATE 4 MG/ML IJ SOLN
4.0000 mg | Freq: Once | INTRAMUSCULAR | Status: AC
  Administered 2013-12-16: 4 mg via INTRAVENOUS
  Filled 2013-12-16: qty 1

## 2013-12-16 MED ORDER — SODIUM CHLORIDE 0.9 % IV SOLN
Freq: Once | INTRAVENOUS | Status: AC
  Administered 2013-12-16: 09:00:00 via INTRAVENOUS

## 2013-12-16 MED ORDER — HYDROCODONE-ACETAMINOPHEN 5-325 MG PO TABS
1.0000 | ORAL_TABLET | ORAL | Status: DC | PRN
  Administered 2013-12-16 – 2013-12-17 (×2): 2 via ORAL
  Administered 2013-12-17: 1 via ORAL
  Administered 2013-12-17 – 2013-12-18 (×4): 2 via ORAL
  Filled 2013-12-16 (×2): qty 2
  Filled 2013-12-16: qty 1
  Filled 2013-12-16: qty 2
  Filled 2013-12-16 (×2): qty 1
  Filled 2013-12-16 (×2): qty 2

## 2013-12-16 MED ORDER — CARVEDILOL 6.25 MG PO TABS
6.2500 mg | ORAL_TABLET | Freq: Two times a day (BID) | ORAL | Status: DC
  Administered 2013-12-16 (×2): 6.25 mg via ORAL
  Filled 2013-12-16 (×5): qty 1

## 2013-12-16 MED ORDER — HYDRALAZINE HCL 20 MG/ML IJ SOLN
5.0000 mg | Freq: Once | INTRAMUSCULAR | Status: AC
  Administered 2013-12-16: 5 mg via INTRAVENOUS
  Filled 2013-12-16: qty 1

## 2013-12-16 MED ORDER — ASPIRIN 81 MG PO CHEW
81.0000 mg | CHEWABLE_TABLET | Freq: Every day | ORAL | Status: DC
  Administered 2013-12-16: 81 mg via ORAL
  Filled 2013-12-16: qty 1

## 2013-12-16 MED ORDER — ASPIRIN EC 81 MG PO TBEC
81.0000 mg | DELAYED_RELEASE_TABLET | Freq: Every day | ORAL | Status: DC
  Administered 2013-12-16 – 2013-12-18 (×3): 81 mg via ORAL
  Filled 2013-12-16 (×3): qty 1

## 2013-12-16 MED ORDER — ONDANSETRON HCL 4 MG/2ML IJ SOLN
4.0000 mg | Freq: Once | INTRAMUSCULAR | Status: AC
  Administered 2013-12-16: 4 mg via INTRAVENOUS
  Filled 2013-12-16: qty 2

## 2013-12-16 MED ORDER — HYDRALAZINE HCL 20 MG/ML IJ SOLN
10.0000 mg | INTRAMUSCULAR | Status: DC | PRN
  Administered 2013-12-16: 10 mg via INTRAVENOUS
  Filled 2013-12-16: qty 1

## 2013-12-16 MED ORDER — GUAIFENESIN-DM 100-10 MG/5ML PO SYRP
5.0000 mL | ORAL_SOLUTION | ORAL | Status: DC | PRN
  Filled 2013-12-16: qty 5

## 2013-12-16 MED ORDER — ATORVASTATIN CALCIUM 10 MG PO TABS
10.0000 mg | ORAL_TABLET | Freq: Every day | ORAL | Status: DC
  Administered 2013-12-16 – 2013-12-18 (×3): 10 mg via ORAL
  Filled 2013-12-16 (×3): qty 1

## 2013-12-16 MED ORDER — POLYETHYLENE GLYCOL 3350 17 G PO PACK
17.0000 g | PACK | Freq: Every day | ORAL | Status: DC | PRN
  Filled 2013-12-16: qty 1

## 2013-12-16 NOTE — ED Notes (Signed)
Lab at bedside to draw

## 2013-12-16 NOTE — H&P (Addendum)
Patient Demographics  Trevor Murphy, is a 79 y.o. male  MRN: [id number]   DOB - November 06, 1934  Admit Date - 12/16/2013  Outpatient Primary MD for the patient is Maximino Greenland, MD   With History of -  Past Medical History  Diagnosis Date  . Poor circulation   . Hernia     umbilical  . Gout   . Glaucoma   . Hypertension     does not see a cardiologist  . Arthritis   . Chronic kidney disease   . Headache(784.0)     " WHEN MY BLOOD PRESSURE IS UP"      Past Surgical History  Procedure Laterality Date  . Hernia repair    . Ventral hernia repair  03/07/2012    Procedure: LAPAROSCOPIC VENTRAL HERNIA;  Surgeon: Gwenyth Ober, MD;  Location: [facility];  Service: General;  Laterality: N/A;  . Appendectomy    . Cataract extraction extracapsular Right 05/17/2013    Procedure: CATARACT EXTRACTION EXTRACAPSULAR WITH INTRAOCULAR LENS PLACEMENT (IOC) RIGHT EYE;  Surgeon: Marylynn Pearson, MD;  Location: [place];  Service: Ophthalmology;  Laterality: Right;    in for   Chief Complaint  Patient presents with  . Chest Pain  . Headache     HPI  Trevor Murphy  is a 79 y.o. male, with history of CVA in the past, hypertension, dyslipidemia, gout, glaucoma, blindness left eye, chronic kidney disease stage IV baseline creatinine around 1.5 who ran out of his blood pressure medications several days ago and did not choose to refill comes to the hospital second time in the last few days with generalized headache and blurry vision, he had some chest pressure which is completely resolved, he says whenever his blood pressure shoots up he gets these symptoms, he checked his blood pressure and it was above 200, came to the ER where his blood pressure was found to be 220/113, he underwent MRI of the head which was unremarkable. He had no focal weakness  tingling numbness. I was called to admit the patient for hypertensive crisis.   Currently he is in the bed, denies any headache, he is blind in the left eye in the right eye blurriness improving, denies any focal weakness, no problems swallowing food or liquids, no for speech, denies any chest pain pressure shortness of breath cough, no palpitations, no belly pain, no blood in stool or urine, no undesired weight loss. Currently symptom-free except resolving right-sided blurry vision.    Review of Systems    In addition to the HPI above,  No Fever-chills, No Headache, No changes with hearing, he is legally blind in the left eye, right eye vision was mildly bloody which is improving No problems swallowing food or Liquids, Currently no Chest pain, Cough or Shortness of Breath, No Abdominal pain, No Nausea or Vommitting, Bowel movements are regular, No Blood in stool or Urine, No dysuria, No new skin rashes or bruises, No new  joints pains-aches,  No new weakness, tingling, numbness in any extremity, No recent weight gain or loss, No polyuria, polydypsia or polyphagia, No significant Mental Stressors.  A full 10 point Review of Systems was done, except as stated above, all other Review of Systems were negative.   Social History History  Substance Use Topics  . Smoking status: Never Smoker   . Smokeless tobacco: Never Used  . Alcohol Use: 0.6 oz/week    1 Cans of beer per week     Comment: occassional      Family History No family history of CVA  Prior to Admission medications   Medication Sig Start Date End Date Taking? Authorizing Provider  carvedilol (COREG) 6.25 MG tablet Take 1 tablet (6.25 mg total) by mouth 2 (two) times daily with a meal. 10/27/13  Yes Jasper Riling. Pickering, MD  rosuvastatin (CRESTOR) 5 MG tablet Take 1 tablet (5 mg total) by mouth daily. 10/27/13  Yes Nathan R. Alvino Chapel, MD    No Known Allergies  Physical Exam  Vitals  Blood pressure 203/99, pulse  72, temperature 98.6 F (37 C), temperature source Oral, resp. rate 18, SpO2 99.00%.   1. General  elderly black gentleman sitting in hospital bed lying in bed in NAD,    2. Normal affect and insight, Not Suicidal or Homicidal, Awake Alert, Oriented X 3.  3. No F.N deficits, ALL C.Nerves Intact, Strength 5/5 all 4 extremities, Sensation intact all 4 extremities, Plantars down going.  4. Ears and Eyes appear Normal, Conjunctivae clear, PERRLA. Moist Oral Mucosa.  5. Supple Neck, No JVD, No cervical lymphadenopathy appriciated, No Carotid Bruits.  6. Symmetrical Chest wall movement, Good air movement bilaterally, CTAB.  7. RRR, No Gallops, Rubs or Murmurs, No Parasternal Heave.  8. Positive Bowel Sounds, Abdomen Soft, Non tender, No organomegaly appriciated,No rebound -guarding or rigidity.  9.  No Cyanosis, Normal Skin Turgor, No Skin Rash or Bruise.  10. Good muscle tone,  joints appear normal , no effusions, Normal ROM.  11. No Palpable Lymph Nodes in Neck or Axillae     Data Review  CBC  Recent Labs Lab 12/16/13 0438  WBC 4.5  HGB 14.5  HCT 44.9  PLT 184  MCV 82.1  MCH 26.5  MCHC 32.3  RDW 15.9*   ------------------------------------------------------------------------------------------------------------------  Chemistries   Recent Labs Lab 12/16/13 0438  NA 142  K 3.7  CL 104  CO2 25  GLUCOSE 107*  BUN 13  CREATININE 1.39*  CALCIUM 8.5   ------------------------------------------------------------------------------------------------------------------ CrCl is unknown because both a height and weight (above a minimum accepted value) are required for this calculation. ------------------------------------------------------------------------------------------------------------------ No results found for this basename: TSH, T4TOTAL, FREET3, T3FREE, THYROIDAB,  in the last 72 hours   Coagulation profile  Recent Labs Lab 12/16/13 0750  INR 1.00    ------------------------------------------------------------------------------------------------------------------- No results found for this basename: DDIMER,  in the last 72 hours -------------------------------------------------------------------------------------------------------------------  Cardiac Enzymes No results found for this basename: CK, CKMB, TROPONINI, MYOGLOBIN,  in the last 168 hours ------------------------------------------------------------------------------------------------------------------ No components found with this basename: POCBNP,    ---------------------------------------------------------------------------------------------------------------  Urinalysis    Component Value Date/Time   COLORURINE YELLOW 12/16/2013 0848   APPEARANCEUR CLEAR 12/16/2013 0848   LABSPEC 1.019 12/16/2013 0848   PHURINE 6.0 12/16/2013 0848   GLUCOSEU NEGATIVE 12/16/2013 0848   HGBUR SMALL* 12/16/2013 0848   BILIRUBINUR NEGATIVE 12/16/2013 0848   KETONESUR NEGATIVE 12/16/2013 0848   PROTEINUR 100* 12/16/2013 0848   UROBILINOGEN 1.0 12/16/2013 0848  NITRITE NEGATIVE 12/16/2013 0848   LEUKOCYTESUR NEGATIVE 12/16/2013 0848    ----------------------------------------------------------------------------------------------------------------  Imaging results:   Dg Chest 2 View  12/16/2013   CLINICAL DATA:  Chest pain and shortness of breath  EXAM: CHEST  2 VIEW  COMPARISON:  10/27/2013  FINDINGS: No cardiomegaly. Chronic aortic tortuosity. Mild pulmonary hyperinflation. No edema, consolidation, effusion, or pneumothorax. Bilateral nipple shadows. Sclerotic focus in the antral lateral right seventh rib was also seen 03/18/2010, compatible with a benign process.  IMPRESSION: No active cardiopulmonary disease.   Electronically Signed   By: Jorje Guild M.D.   On: 12/16/2013 06:37   Mr Brain Wo Contrast  12/16/2013   CLINICAL DATA:  Ataxia.  Hypertension.  EXAM: MRI HEAD WITHOUT CONTRAST  TECHNIQUE:  Multiplanar, multiecho pulse sequences of the brain and surrounding structures were obtained without intravenous contrast.  COMPARISON:  CT head without contrast 11/27/2012. MRI brain 07/26/2012.  FINDINGS: The diffusion-weighted images demonstrate no evidence for acute or subacute infarction. Moderate generalized atrophy and diffuse white matter disease is evident bilaterally. There are remote lacunar infarcts within the corona radiata bilaterally. Remote lacunar infarcts are noted in the thalami bilaterally as well as the posterior left putamen. A remote lacunar infarct is present in the right paramedian pons. Remote lacunar infarcts are present in the cerebellum bilaterally, right greater than left. No hemorrhage or mass lesion is present.  Flow is present in the major intracranial arteries. The patient is status post right lens replacement. The globes and orbits are otherwise intact. A T1 hyperintense nodule is present along the floor of the right sphenoid sinus, not present on the prior MRI. There is some adjacent mucosal thickening. The remaining paranasal sinuses and the mastoid air cells are clear.  IMPRESSION: 1. No acute intracranial abnormality. 2. Moderate generalized atrophy and diffuse white matter disease likely reflects the sequelae of chronic microvascular ischemia. 3. Remote lacunar infarcts in the corona radiata, remote lacunar infarcts in the bilateral coronal radiata, basal ganglia, and cerebellum. 4. Remote lacunar infarct within the right paramedian pons. 7 mm T1 hyperintense nodule of the right sphenoid sinus. This likely represents a proteinaceous cyst. The an early neoplasm is not excluded. Recommend follow-up CT of the sinuses in 2-3 months. These results will be called to the ordering clinician or representative by the Radiologist Assistant, and communication documented in the PACS Dashboard.   Electronically Signed   By: Lawrence Santiago M.D.   On: 12/16/2013 10:04    My personal review  of EKG: Rhythm NSR,   no Acute ST changes    Assessment & Plan   1. Hypertensive crisis with right eye blurry vision, legally blind in the left eye. As is typical symptom for his high blood pressure per patient, MRI brain unremarkable, will admit on a telemetry bed, continue neurochecks every 2 hours although I do not think this is a neurological event, this is likely hypertensive encephalopathy due to hypertensive crisis.   Have placed him on a combination of Norvasc-hydralazine-Coreg with as needed IV hydralazine with written parameters. Counseled him on compliance with with blood pressure medications. Will monitor on telemetry.     2. Hypertension. Blood pressure medications adjusted as above.     3. Dyslipidemia. Continue home dose statin.    4. History of CVA we'll place him on 81 mg of aspirin. Continue statin for secondary prevention.    5. Legally blind in left eye. No acute issues outpatient followup with ophthalmology and PCP.    6. CK  D. stage IV. Creatinine at or better than baseline.     DVT Prophylaxis SCDs   AM Labs Ordered, also please review Full Orders  Family Communication: Admission, patients condition and plan of care including tests being ordered have been discussed with the patient   who indicates understanding and agree with the plan and Code Status.  Code Status full  Likely DC to  home  Condition Fair  Time spent in minutes : 35    Thurnell Lose M.D on 12/16/2013 at 10:52 AM  Between 7am to 7pm - Pager - [phone]  After 7pm go to [email or web address] - password [email or web address]  And look for the night coverage person covering me after hours  [facility] Office  [phone]

## 2013-12-16 NOTE — ED Notes (Signed)
Admitting at bedside 

## 2013-12-16 NOTE — ED Notes (Signed)
Per EMS - pt from home w/ c/o chest pain, HA, and blurred vision x4 days - pt unable to afford his HTN medications. Pt evaluated x2weeks ago for same complaints.

## 2013-12-16 NOTE — ED Notes (Signed)
Lab at bedside

## 2013-12-16 NOTE — ED Provider Notes (Signed)
Medical screening examination/treatment/procedure(s) were conducted as a shared visit with non-physician practitioner(s) and myself.  I personally evaluated the patient during the encounter.   EKG Interpretation   Date/Time:  Saturday Dec 16 2013 04:21:56 EDT Ventricular Rate:  78 PR Interval:    QRS Duration: 95 QT Interval:  481 QTC Calculation: 548 R Axis:   28 Text Interpretation:  Normal sinus rhythm Borderline abnrm T,  anterolateral leads Prolonged QT interval Confirmed by [name], MD, [name]  [phone]) on 12/16/2013 5:48:27 AM       [name], MD 12/16/13 1525

## 2013-12-16 NOTE — ED Provider Notes (Signed)
CSN: [id number]     Arrival date & time 12/16/13  0408 History   First MD Initiated Contact with Patient 12/16/13 [phone]     Chief Complaint  Patient presents with  . Chest Pain  . Headache     (Consider location/radiation/quality/duration/timing/severity/associated sxs/prior Treatment) HPI  Trevor Murphy is a 79 y.o. male with past medical history significant for CKD, hypertension complaining of substernal chest pain described as "hurting" pain is intermittent and lasts for a few seconds, associated with nausea and shortness of breath onset yesterday with associated headache and blurred vision. Patient has baseline poor vision due to significant glaucoma in the left eye. Patient reports an associated shortness of breath and stumbling on review of systems. Patient is unable to afford his high blood pressure medications and has not had in 2 weeks. States that he has had a stress test approximately 4 months ago and believes that it was normal. Patient denies fever, cough,  rash, confusion, cervicalgia, LOC/syncope, change in vision, N/V, numbness, weakness, dysarthria, ataxia, thunderclap onset, exacerbation with exertion or valsalva, exacerbation in morning, CP, SOB, abdominal pain.  Past Medical History  Diagnosis Date  . Poor circulation   . Hernia     umbilical  . Gout   . Glaucoma   . Hypertension     does not see a cardiologist  . Arthritis   . Chronic kidney disease   . Headache(784.0)     " WHEN MY BLOOD PRESSURE IS UP"   Past Surgical History  Procedure Laterality Date  . Hernia repair    . Ventral hernia repair  03/07/2012    Procedure: LAPAROSCOPIC VENTRAL HERNIA;  Surgeon: Gwenyth Ober, MD;  Location: [place];  Service: General;  Laterality: N/A;  . Appendectomy    . Cataract extraction extracapsular Right 05/17/2013    Procedure: CATARACT EXTRACTION EXTRACAPSULAR WITH INTRAOCULAR LENS PLACEMENT (IOC) RIGHT EYE;  Surgeon: Marylynn Pearson, MD;  Location: [place];  Service:  Ophthalmology;  Laterality: Right;   History reviewed. No pertinent family history. History  Substance Use Topics  . Smoking status: Never Smoker   . Smokeless tobacco: Never Used  . Alcohol Use: 0.6 oz/week    1 Cans of beer per week     Comment: occassional    Review of Systems  10 systems reviewed and found to be negative, except as noted in the HPI.  Allergies  Review of patient's allergies indicates no known allergies.  Home Medications   Prior to Admission medications   Medication Sig Start Date End Date Taking? Authorizing Provider  aspirin 81 MG EC tablet Take 1 tablet (81 mg total) by mouth daily. 10/27/13   Jasper Riling. Alvino Chapel, MD  carvedilol (COREG) 6.25 MG tablet Take 1 tablet (6.25 mg total) by mouth 2 (two) times daily with a meal. 10/27/13   Jasper Riling. Pickering, MD  rosuvastatin (CRESTOR) 5 MG tablet Take 1 tablet (5 mg total) by mouth daily. 10/27/13   Jasper Riling. Pickering, MD   BP 223/113  Pulse 79  Temp(Src) 98.6 F (37 C) (Oral)  Resp 19  SpO2 98% Physical Exam  Nursing note and vitals reviewed. Constitutional: He is oriented to person, place, and time. He appears well-developed and well-nourished. No distress.  HENT:  Head: Normocephalic and atraumatic.  Mouth/Throat: Oropharynx is clear and moist.  Eyes: Conjunctivae and EOM are normal.  Left Eye minimally responsive to light, Right eye with sluggish respose  Neck: Normal range of motion.  Cardiovascular: Normal rate,  regular rhythm and intact distal pulses.   Pulmonary/Chest: Effort normal and breath sounds normal. No stridor. No respiratory distress. He has no wheezes. He has no rales. He exhibits no tenderness.  Abdominal: Soft. Bowel sounds are normal. He exhibits no distension and no mass. There is no tenderness. There is no rebound and no guarding.  Musculoskeletal: Normal range of motion. He exhibits no edema.  Neurological: He is alert and oriented to person, place, and time.  II-Visual fields  grossly intact. III/IV/VI-Extraocular movements intact.   Left pupil non reactive, right pupil sluggish V/VII-Smile symmetric, equal eyebrow raise,  facial sensation intact VIII- Hearing grossly intact IX/X-Normal gag XI-bilateral shoulder shrug XII-midline tongue extension Motor: 5/5 bilaterally with normal tone and bulk Cerebellar: Normal finger-to-nose  and normal heel-to-shin test.   Romberg negative Ambulates with a coordinated gait   Psychiatric: He has a normal mood and affect.    ED Course  Procedures (including critical care time) Labs Review Labs Reviewed  CBC - Abnormal; Notable for the following:    RDW 15.9 (*)    All other components within normal limits  BASIC METABOLIC PANEL - Abnormal; Notable for the following:    Glucose, Bld 107 (*)    Creatinine, Ser 1.39 (*)    GFR calc non Af Amer 48 (*)    GFR calc Af Amer 55 (*)    All other components within normal limits  URINALYSIS, ROUTINE W REFLEX MICROSCOPIC - Abnormal; Notable for the following:    Hgb urine dipstick SMALL (*)    Protein, ur 100 (*)    All other components within normal limits  PROTIME-INR  APTT  URINE MICROSCOPIC-ADD ON  PRO B NATRIURETIC PEPTIDE  TROPONIN I  TROPONIN I  TROPONIN I  I-STAT TROPOININ, ED  I-STAT TROPOININ, ED  Trevor Murphy, ED    Imaging Review Dg Chest 2 View  12/16/2013   CLINICAL DATA:  Chest pain and shortness of breath  EXAM: CHEST  2 VIEW  COMPARISON:  10/27/2013  FINDINGS: No cardiomegaly. Chronic aortic tortuosity. Mild pulmonary hyperinflation. No edema, consolidation, effusion, or pneumothorax. Bilateral nipple shadows. Sclerotic focus in the antral lateral right seventh rib was also seen 03/18/2010, compatible with a benign process.  IMPRESSION: No active cardiopulmonary disease.   Electronically Signed   By: Jorje Guild M.D.   On: 12/16/2013 06:37   Mr Brain Wo Contrast  12/16/2013   CLINICAL DATA:  Ataxia.  Hypertension.  EXAM: MRI HEAD WITHOUT  CONTRAST  TECHNIQUE: Multiplanar, multiecho pulse sequences of the brain and surrounding structures were obtained without intravenous contrast.  COMPARISON:  CT head without contrast 11/27/2012. MRI brain 07/26/2012.  FINDINGS: The diffusion-weighted images demonstrate no evidence for acute or subacute infarction. Moderate generalized atrophy and diffuse white matter disease is evident bilaterally. There are remote lacunar infarcts within the corona radiata bilaterally. Remote lacunar infarcts are noted in the thalami bilaterally as well as the posterior left putamen. A remote lacunar infarct is present in the right paramedian pons. Remote lacunar infarcts are present in the cerebellum bilaterally, right greater than left. No hemorrhage or mass lesion is present.  Flow is present in the major intracranial arteries. The patient is status post right lens replacement. The globes and orbits are otherwise intact. A T1 hyperintense nodule is present along the floor of the right sphenoid sinus, not present on the prior MRI. There is some adjacent mucosal thickening. The remaining paranasal sinuses and the mastoid air cells are clear.  IMPRESSION: 1.  No acute intracranial abnormality. 2. Moderate generalized atrophy and diffuse white matter disease likely reflects the sequelae of chronic microvascular ischemia. 3. Remote lacunar infarcts in the corona radiata, remote lacunar infarcts in the bilateral coronal radiata, basal ganglia, and cerebellum. 4. Remote lacunar infarct within the right paramedian pons. 7 mm T1 hyperintense nodule of the right sphenoid sinus. This likely represents a proteinaceous cyst. The an early neoplasm is not excluded. Recommend follow-up CT of the sinuses in 2-3 months. These results will be called to the ordering clinician or representative by the Radiologist Assistant, and communication documented in the PACS Dashboard.   Electronically Signed   By: Lawrence Santiago M.D.   On: 12/16/2013 10:04      EKG Interpretation   Date/Time:  Saturday Dec 16 2013 04:21:56 EDT Ventricular Rate:  78 PR Interval:    QRS Duration: 95 QT Interval:  481 QTC Calculation: 548 R Axis:   28 Text Interpretation:  Normal sinus rhythm Borderline abnrm T,  anterolateral leads Prolonged QT interval Confirmed by [name], MD, [name]  [phone]) on 12/16/2013 5:48:27 AM     07:45AM patient seen and evaluated with attending physician, does not suspect dissection. Recommends MRI over CT.  10:19 BP 183/108 MD aware, recommends not administering further BP medications.  MDM   Final diagnoses:  Hypertensive urgency  Chest pain    Filed Vitals:   12/16/13 0747 12/16/13 0844 12/16/13 0954 12/16/13 1042  BP: 209/113 218/124 183/108 203/99  Pulse: 87  65 72  Temp:      TempSrc:      Resp: 18  18 18   SpO2: 99%  97% 99%    Medications  hydrALAZINE (APRESOLINE) injection 10 mg (10 mg Intravenous Given 12/16/13 1041)  amLODipine (NORVASC) tablet 10 mg (not administered)  carvedilol (COREG) tablet 6.25 mg (not administered)  hydrALAZINE (APRESOLINE) tablet 50 mg (not administered)  aspirin chewable tablet 81 mg (not administered)  carvedilol (COREG) tablet 6.25 mg (6.25 mg Oral Given 12/16/13 0748)  morphine 4 MG/ML injection 4 mg (4 mg Intravenous Given 12/16/13 0848)  ondansetron (ZOFRAN) injection 4 mg (4 mg Intravenous Given 12/16/13 0848)  0.9 %  sodium chloride infusion ( Intravenous New Bag/Given 12/16/13 0851)  hydrALAZINE (APRESOLINE) injection 5 mg (5 mg Intravenous Given 12/16/13 0848)    Trevor Murphy is a 79 y.o. male presenting with significantly elevated blood pressure highest recorded was 218/124. Patient noncompliant with hypertension meds secondary to financial issues. Associated symptoms of headache, blurred vision and chest pain. Echo from 07/27/2012 with normal EF, mild LVH and aortic regurg.  EKG shows prolonged QT with no ischemic changes. Troponin is negative. Chest x-ray with no signs of  infiltrate or cardiomegaly. Blood work unremarkable except for elevated creatinine which is at his baseline. MRI with no signs of acute stroke. Patient will be admitted to Dr. Candiss Norse for he hypertensive urgency and chest pain  Note: Portions of this report may have been transcribed using voice recognition software. Every effort was made to ensure accuracy; however, inadvertent computerized transcription errors may be present   Monico Blitz, PA-C 12/16/13 1100

## 2013-12-16 NOTE — ED Notes (Signed)
Pt states 6/10 headache at the time. Remains hypertensive on cardiac monitor.

## 2013-12-16 NOTE — ED Notes (Addendum)
Pt to xray, c/o "HA only", some sob, (denies: nausea, CP or dizziness), alert, NAD, calm, interactive, resps e/u, speaking in clear complete sentences.

## 2013-12-16 NOTE — ED Notes (Signed)
Pt transported to MRI 

## 2013-12-16 NOTE — ED Provider Notes (Signed)
Medical screening examination/treatment/procedure(s) were conducted as a shared visit with non-physician practitioner(s) and myself.  I personally evaluated the patient during the encounter.   EKG Interpretation   Date/Time:  Saturday Dec 16 2013 04:21:56 EDT Ventricular Rate:  78 PR Interval:    QRS Duration: 95 QT Interval:  481 QTC Calculation: 548 R Axis:   28 Text Interpretation:  Normal sinus rhythm Borderline abnrm T,  anterolateral leads Prolonged QT interval Confirmed by [name], MD, [name]  [phone]) on 12/16/2013 5:48:27 AM      Patient here complaining of in her mid chest pain along with headache. Pt notes ataxia without ext weakness. Chest lasting seconds. Pt unable to get htn meds. First cardiac markers negative. Patient nonfocal neurological exam here. Will obtain MRI to rule out cerebellar infarct.  [name], MD 12/16/13 [phone]

## 2013-12-16 NOTE — ED Notes (Signed)
Attempted to call report

## 2013-12-17 DIAGNOSIS — N179 Acute kidney failure, unspecified: Secondary | ICD-10-CM

## 2013-12-17 DIAGNOSIS — N189 Chronic kidney disease, unspecified: Secondary | ICD-10-CM

## 2013-12-17 LAB — CBC
HCT: 47 % (ref 39.0–52.0)
Hemoglobin: 14.9 g/dL (ref 13.0–17.0)
MCH: 26.5 pg (ref 26.0–34.0)
MCHC: 31.7 g/dL (ref 30.0–36.0)
MCV: 83.6 fL (ref 78.0–100.0)
Platelets: 184 10*3/uL (ref 150–400)
RBC: 5.62 MIL/uL (ref 4.22–5.81)
RDW: 16.6 % — ABNORMAL HIGH (ref 11.5–15.5)
WBC: 4.4 10*3/uL (ref 4.0–10.5)

## 2013-12-17 LAB — BASIC METABOLIC PANEL
BUN: 14 mg/dL (ref 6–23)
CO2: 24 mEq/L (ref 19–32)
Calcium: 8.1 mg/dL — ABNORMAL LOW (ref 8.4–10.5)
Chloride: 107 mEq/L (ref 96–112)
Creatinine, Ser: 1.35 mg/dL (ref 0.50–1.35)
GFR calc Af Amer: 57 mL/min — ABNORMAL LOW (ref >90)
GFR calc non Af Amer: 49 mL/min — ABNORMAL LOW (ref >90)
Glucose, Bld: 101 mg/dL — ABNORMAL HIGH (ref 70–99)
Potassium: 4 mEq/L (ref 3.7–5.3)
Sodium: 141 mEq/L (ref 137–147)

## 2013-12-17 MED ORDER — POLYVINYL ALCOHOL 1.4 % OP SOLN
1.0000 [drp] | OPHTHALMIC | Status: DC | PRN
  Administered 2013-12-17: 1 [drp] via OPHTHALMIC
  Filled 2013-12-17: qty 15

## 2013-12-17 MED ORDER — HYDRALAZINE HCL 50 MG PO TABS
100.0000 mg | ORAL_TABLET | Freq: Three times a day (TID) | ORAL | Status: DC
  Administered 2013-12-17 – 2013-12-21 (×11): 100 mg via ORAL
  Filled 2013-12-17 (×15): qty 2

## 2013-12-17 MED ORDER — CARVEDILOL 12.5 MG PO TABS
12.5000 mg | ORAL_TABLET | Freq: Two times a day (BID) | ORAL | Status: DC
  Administered 2013-12-17 – 2013-12-18 (×4): 12.5 mg via ORAL
  Filled 2013-12-17 (×5): qty 1

## 2013-12-17 NOTE — Progress Notes (Signed)
UR Completed.  Trevor Murphy [id number] 12/17/2013

## 2013-12-17 NOTE — Progress Notes (Signed)
Patient ID: Trevor Murphy  male  [id number]    DOB: [birth year]    DOA: 12/16/2013  PCP: Maximino Greenland, MD  Assessment/Plan: Principal Problem:   Hypertensive crisis with headache and blurry vision: Noncompliant with his medications, feels dizzy - BP still uncontrolled - MRI negative for acute CVA, no focal neurological deficits - Increase the Coreg to 12.5 BID, hydralazine to 100mg  TID, Norvasc 10 mg daily - PT evaluation  Active Problems:   CKD (chronic kidney disease) stage 3, GFR 30-59 ml/min: Likely due to uncontrolled hypertensive nephropathy - Creatinine currently stable at 1.3   History of CVA (cerebral infarction) -Continue aspirin 81 mg daily, statin     Dyslipidemia - Continue statin   DVT Prophylaxis:SCDs  Code Status:Full code  Family Communication:  Disposition:DC home in a.m. if BP is better controlled  Consultants:  None  Procedures: None  Antibiotics:  None  Subjective: Complaining of dizziness and headache, blurry vision. No chest pain or shortness of  Objective: Weight change:   Intake/Output Summary (Last 24 hours) at 12/17/13 1102 Last data filed at 12/17/13 0500  Gross per 24 hour  Intake      3 ml  Output    350 ml  Net   -347 ml   Blood pressure 181/93, pulse 89, temperature 97.6 F (36.4 C), temperature source Oral, resp. rate 16, height 5\' 10"  (1.778 m), weight 75.479 kg (166 lb 6.4 oz), SpO2 97.00%.  Physical Exam: General: Alert and awake, oriented x3, not in any acute distress. CVS: S1-S2 clear, no murmur rubs or gallops Chest: clear to auscultation bilaterally, no wheezing, rales or rhonchi Abdomen: soft nontender, nondistended, normal bowel sounds  Extremities: no cyanosis, clubbing or edema noted bilaterally Neuro: Cranial nerves II-XII intact, no focal neurological deficits, Gait not assessed  Lab Results: Basic Metabolic Panel:  Recent Labs Lab 12/16/13 0438 12/17/13 0410  NA 142 141  K 3.7 4.0  CL 104 107   CO2 25 24  GLUCOSE 107* 101*  BUN 13 14  CREATININE 1.39* 1.35  CALCIUM 8.5 8.1*   Liver Function Tests: No results found for this basename: AST, ALT, ALKPHOS, BILITOT, PROT, ALBUMIN,  in the last 168 hours No results found for this basename: LIPASE, AMYLASE,  in the last 168 hours No results found for this basename: AMMONIA,  in the last 168 hours CBC:  Recent Labs Lab 12/16/13 0438 12/17/13 0410  WBC 4.5 4.4  HGB 14.5 14.9  HCT 44.9 47.0  MCV 82.1 83.6  PLT 184 184   Cardiac Enzymes:  Recent Labs Lab 12/16/13 1115 12/16/13 1605 12/16/13 2229  TROPONINI <0.30 <0.30 <0.30   BNP: No components found with this basename: POCBNP,  CBG: No results found for this basename: GLUCAP,  in the last 168 hours   Micro Results: No results found for this or any previous visit (from the past 240 hour(s)).  Studies/Results: Dg Chest 2 View  12/16/2013   CLINICAL DATA:  Chest pain and shortness of breath  EXAM: CHEST  2 VIEW  COMPARISON:  10/27/2013  FINDINGS: No cardiomegaly. Chronic aortic tortuosity. Mild pulmonary hyperinflation. No edema, consolidation, effusion, or pneumothorax. Bilateral nipple shadows. Sclerotic focus in the antral lateral right seventh rib was also seen 03/18/2010, compatible with a benign process.  IMPRESSION: No active cardiopulmonary disease.   Electronically Signed   By: Jorje Guild M.D.   On: 12/16/2013 06:37   Mr Brain Wo Contrast  12/16/2013   CLINICAL DATA:  Ataxia.  Hypertension.  EXAM: MRI HEAD WITHOUT CONTRAST  TECHNIQUE: Multiplanar, multiecho pulse sequences of the brain and surrounding structures were obtained without intravenous contrast.  COMPARISON:  CT head without contrast 11/27/2012. MRI brain 07/26/2012.  FINDINGS: The diffusion-weighted images demonstrate no evidence for acute or subacute infarction. Moderate generalized atrophy and diffuse white matter disease is evident bilaterally. There are remote lacunar infarcts within the corona  radiata bilaterally. Remote lacunar infarcts are noted in the thalami bilaterally as well as the posterior left putamen. A remote lacunar infarct is present in the right paramedian pons. Remote lacunar infarcts are present in the cerebellum bilaterally, right greater than left. No hemorrhage or mass lesion is present.  Flow is present in the major intracranial arteries. The patient is status post right lens replacement. The globes and orbits are otherwise intact. A T1 hyperintense nodule is present along the floor of the right sphenoid sinus, not present on the prior MRI. There is some adjacent mucosal thickening. The remaining paranasal sinuses and the mastoid air cells are clear.  IMPRESSION: 1. No acute intracranial abnormality. 2. Moderate generalized atrophy and diffuse white matter disease likely reflects the sequelae of chronic microvascular ischemia. 3. Remote lacunar infarcts in the corona radiata, remote lacunar infarcts in the bilateral coronal radiata, basal ganglia, and cerebellum. 4. Remote lacunar infarct within the right paramedian pons. 7 mm T1 hyperintense nodule of the right sphenoid sinus. This likely represents a proteinaceous cyst. The an early neoplasm is not excluded. Recommend follow-up CT of the sinuses in 2-3 months. These results will be called to the ordering clinician or representative by the Radiologist Assistant, and communication documented in the PACS Dashboard.   Electronically Signed   By: Lawrence Santiago M.D.   On: 12/16/2013 10:04    Medications: Scheduled Meds: . amLODipine  10 mg Oral Daily  . aspirin EC  81 mg Oral Daily  . atorvastatin  10 mg Oral q1800  . carvedilol  12.5 mg Oral BID WC  . hydrALAZINE  50 mg Oral 3 times per day  . sodium chloride  3 mL Intravenous Q12H      LOS: 1 day   Ripudeep Krystal Eaton M.D. [facility] 12/17/2013, 11:02 AM Pager: [phone]  If 7PM-7AM, please contact night-coverage [email or web address] Password [email or web address]  **Disclaimer: This  note was dictated with voice recognition software. Similar sounding words can inadvertently be transcribed and this note may contain transcription errors which may not have been corrected upon publication of note.**

## 2013-12-18 ENCOUNTER — Encounter (HOSPITAL_COMMUNITY): Payer: Self-pay | Admitting: Radiology

## 2013-12-18 ENCOUNTER — Observation Stay (HOSPITAL_COMMUNITY): Payer: Medicare Other

## 2013-12-18 DIAGNOSIS — I10 Essential (primary) hypertension: Secondary | ICD-10-CM

## 2013-12-18 DIAGNOSIS — I5033 Acute on chronic diastolic (congestive) heart failure: Secondary | ICD-10-CM

## 2013-12-18 DIAGNOSIS — R079 Chest pain, unspecified: Secondary | ICD-10-CM

## 2013-12-18 DIAGNOSIS — I498 Other specified cardiac arrhythmias: Secondary | ICD-10-CM

## 2013-12-18 DIAGNOSIS — I509 Heart failure, unspecified: Secondary | ICD-10-CM

## 2013-12-18 DIAGNOSIS — N183 Chronic kidney disease, stage 3 unspecified: Secondary | ICD-10-CM

## 2013-12-18 LAB — D-DIMER, QUANTITATIVE: D-Dimer, Quant: 1.43 ug/mL-FEU — ABNORMAL HIGH (ref 0.00–0.48)

## 2013-12-18 LAB — TROPONIN I: Troponin I: <0.3 ng/mL (ref <0.30)

## 2013-12-18 LAB — SEDIMENTATION RATE: Sed Rate: 2 mm/hr (ref 0–16)

## 2013-12-18 LAB — URIC ACID: Uric Acid, Serum: 9.5 mg/dL — ABNORMAL HIGH (ref 4.0–7.8)

## 2013-12-18 LAB — MRSA PCR SCREENING: MRSA by PCR: NEGATIVE

## 2013-12-18 LAB — C-REACTIVE PROTEIN: CRP: 8 mg/dL — ABNORMAL HIGH (ref <0.60)

## 2013-12-18 MED ORDER — ATORVASTATIN CALCIUM 40 MG PO TABS
40.0000 mg | ORAL_TABLET | Freq: Once | ORAL | Status: AC
  Administered 2013-12-18: 40 mg via ORAL
  Filled 2013-12-18 (×2): qty 1

## 2013-12-18 MED ORDER — HYDRALAZINE HCL 100 MG PO TABS: 100.0000 mg | ORAL_TABLET | Freq: Three times a day (TID) | ORAL | Status: DC

## 2013-12-18 MED ORDER — HEPARIN (PORCINE) IN NACL 100-0.45 UNIT/ML-% IJ SOLN
1200.0000 [IU]/h | INTRAMUSCULAR | Status: DC
  Administered 2013-12-18: 900 [IU]/h via INTRAVENOUS
  Filled 2013-12-18 (×2): qty 250

## 2013-12-18 MED ORDER — ASPIRIN 81 MG PO TBEC: 81.0000 mg | DELAYED_RELEASE_TABLET | Freq: Every day | ORAL | Status: DC

## 2013-12-18 MED ORDER — ALLOPURINOL 150 MG HALF TABLET
150.0000 mg | ORAL_TABLET | Freq: Every day | ORAL | Status: DC
  Administered 2013-12-18 – 2013-12-21 (×4): 150 mg via ORAL
  Filled 2013-12-18 (×4): qty 1

## 2013-12-18 MED ORDER — COLCHICINE 0.6 MG PO TABS
0.6000 mg | ORAL_TABLET | Freq: Every day | ORAL | Status: DC
  Administered 2013-12-19 – 2013-12-21 (×3): 0.6 mg via ORAL
  Filled 2013-12-18 (×3): qty 1

## 2013-12-18 MED ORDER — METOPROLOL TARTRATE 1 MG/ML IV SOLN
5.0000 mg | Freq: Once | INTRAVENOUS | Status: AC
  Administered 2013-12-18: 5 mg via INTRAVENOUS

## 2013-12-18 MED ORDER — AMLODIPINE BESYLATE 10 MG PO TABS: 10.0000 mg | ORAL_TABLET | Freq: Every day | ORAL | Status: DC

## 2013-12-18 MED ORDER — NITROGLYCERIN 0.4 MG SL SUBL
0.4000 mg | SUBLINGUAL_TABLET | SUBLINGUAL | Status: DC | PRN
  Administered 2013-12-18 (×2): 0.4 mg via SUBLINGUAL

## 2013-12-18 MED ORDER — IOHEXOL 350 MG/ML SOLN
80.0000 mL | Freq: Once | INTRAVENOUS | Status: AC | PRN
  Administered 2013-12-18: 72 mL via INTRAVENOUS

## 2013-12-18 MED ORDER — CARVEDILOL 12.5 MG PO TABS: 12.5000 mg | ORAL_TABLET | Freq: Two times a day (BID) | ORAL | Status: DC

## 2013-12-18 MED ORDER — ASPIRIN 325 MG PO TABS
325.0000 mg | ORAL_TABLET | Freq: Every day | ORAL | Status: DC
  Administered 2013-12-18 – 2013-12-21 (×4): 325 mg via ORAL
  Filled 2013-12-18 (×4): qty 1

## 2013-12-18 MED ORDER — ATORVASTATIN CALCIUM 40 MG PO TABS
40.0000 mg | ORAL_TABLET | Freq: Every day | ORAL | Status: DC
  Administered 2013-12-19 – 2013-12-20 (×2): 40 mg via ORAL
  Filled 2013-12-18 (×3): qty 1

## 2013-12-18 MED ORDER — MORPHINE SULFATE 2 MG/ML IJ SOLN: 2.0000 mg | INTRAMUSCULAR | Status: DC | PRN

## 2013-12-18 MED ORDER — FUROSEMIDE 10 MG/ML IJ SOLN
40.0000 mg | Freq: Once | INTRAMUSCULAR | Status: AC
  Administered 2013-12-18: 40 mg via INTRAVENOUS
  Filled 2013-12-18: qty 4

## 2013-12-18 MED ORDER — NITROGLYCERIN 2 % TD OINT
0.5000 [in_us] | TOPICAL_OINTMENT | Freq: Four times a day (QID) | TRANSDERMAL | Status: DC
  Administered 2013-12-18: 0.5 [in_us] via TOPICAL
  Filled 2013-12-18: qty 30

## 2013-12-18 MED ORDER — COLCHICINE 0.6 MG PO TABS
1.2000 mg | ORAL_TABLET | Freq: Once | ORAL | Status: AC
  Administered 2013-12-18: 1.2 mg via ORAL
  Filled 2013-12-18: qty 2

## 2013-12-18 MED ORDER — METOPROLOL TARTRATE 1 MG/ML IV SOLN
INTRAVENOUS | Status: AC
  Filled 2013-12-18: qty 5

## 2013-12-18 MED ORDER — CARVEDILOL 6.25 MG PO TABS
18.7500 mg | ORAL_TABLET | Freq: Two times a day (BID) | ORAL | Status: DC
  Administered 2013-12-19 – 2013-12-21 (×5): 18.75 mg via ORAL
  Filled 2013-12-18 (×7): qty 1

## 2013-12-18 MED ORDER — PREDNISONE 50 MG PO TABS
60.0000 mg | ORAL_TABLET | Freq: Every day | ORAL | Status: DC
  Filled 2013-12-18 (×2): qty 1

## 2013-12-18 MED ORDER — ADENOSINE 6 MG/2ML IV SOLN
INTRAVENOUS | Status: AC
Start: 2013-12-18 — End: 2013-12-18
  Administered 2013-12-18: 6 mg
  Filled 2013-12-18: qty 2

## 2013-12-18 MED ORDER — NITROGLYCERIN 0.4 MG SL SUBL
SUBLINGUAL_TABLET | SUBLINGUAL | Status: DC
Start: 2013-12-18 — End: 2013-12-18
  Filled 2013-12-18: qty 1

## 2013-12-18 MED ORDER — ROSUVASTATIN CALCIUM 5 MG PO TABS: 5.0000 mg | ORAL_TABLET | Freq: Every day | ORAL | Status: DC

## 2013-12-18 NOTE — Progress Notes (Signed)
Utilization review completed.  

## 2013-12-18 NOTE — Progress Notes (Signed)
Pt hear rate elevated to 160-170's. Pt is asymptomatic and lying in bed.BP is 149/84 sat 100% on 2l O2 via nasal canula. Md paged and gave order for lopressor IV 5Mg  .  Will continue to monitor pt.  Ferdinand Lango, RN

## 2013-12-18 NOTE — Progress Notes (Addendum)
 Patient ID: Trevor Murphy  male  MRN:[id number]    DOB: 03/27/1935    DOA: 12/16/2013  PCP: SANDERS,ROBYN N, MD  Addendum  CT head negative  X-ray of the right knee showed advanced tricompartmental degenerative changes with chondrocalcinosis and large joint effusion, no bony abnormality.  ESR, CRP normal, uric acid elevated at 9.5  Placed on allopurinol, colchicine, pain control, prednisone  Discussed with Dr. Handy, orthopedics, does not need to tap if not septic arthritis. Recommended to observe tonight, if significant improvement, then they will see him in the office in one week. If not then cortisone shot in the knee on Wednesday.   Ripudeep K Rai M.D. [facility] 12/18/2013, 1:22 PM  Pager: [phone]    Assessment/Plan: Principal Problem:   Hypertensive crisis with headache and blurry vision: Noncompliant with his medications, feels dizzy, today feeling right hand numbness - Repeat CT head today continue aspirin - MRI on 5/9 negative for acute CVA, no focal neurological deficits - Continue Coreg 12.5 BID, hydralazine 100mg TID, Norvasc 10 mg daily - PT evaluation canceled  Active Problems: Right knee pain - Obtain ESR, CRP, uric acid, x-ray of the right knee, may need to tap - States has a history of gout and osteoarthritis, follows Dr. McKinney orthopedics, receives cortisone shots in the knee    CKD (chronic kidney disease) stage 3, GFR 30-59 ml/min: Likely due to uncontrolled hypertensive nephropathy - Creatinine currently stable at 1.3   History of CVA (cerebral infarction) -Continue aspirin 81 mg daily, statin     Dyslipidemia - Continue statin   DVT Prophylaxis:SCDs  Code Status:Full code  Family Communication:  Disposition:  Consultants:  None  Procedures: None   Antibiotics:  None  Subjective: No chest pain or shortness of breath, complaining of right hand numbness started today a.m. And right knee pain today  Objective: Weight  change: 0.091 kg (3.2 oz)  Intake/Output Summary (Last 24 hours) at 12/18/13 0939 Last data filed at 12/18/13 0500  Gross per 24 hour  Intake      0 ml  Output    625 ml  Net   -625 ml   Blood pressure 132/91, pulse 88, temperature 98 F (36.7 C), temperature source Oral, resp. rate 15, height 5' 10" (1.778 m), weight 75.569 kg (166 lb 9.6 oz), SpO2 97.00%.  Physical Exam: General: Alert and awake, oriented x3, not in any acute distress. CVS: S1-S2 clear, no murmur rubs or gallops Chest: clear to auscultation bilaterally, no wheezing, rales or rhonchi Abdomen: soft nontender, nondistended, normal bowel sounds  Extremities: no cyanosis, clubbing or edema noted bilaterally + right knee swollen and tender, erythematous, ROM dec Neuro: Cranial nerves II-XII intact, no focal neurological deficits, Gait not assessed  Lab Results: Basic Metabolic Panel:  Recent Labs Lab 12/16/13 0438 12/17/13 0410  NA 142 141  K 3.7 4.0  CL 104 107  CO2 25 24  GLUCOSE 107* 101*  BUN 13 14  CREATININE 1.39* 1.35  CALCIUM 8.5 8.1*   Liver Function Tests: No results found for this basename: AST, ALT, ALKPHOS, BILITOT, PROT, ALBUMIN,  in the last 168 hours No results found for this basename: LIPASE, AMYLASE,  in the last 168 hours No results found for this basename: AMMONIA,  in the last 168 hours CBC:  Recent Labs Lab 12/16/13 0438 12/17/13 0410  WBC 4.5 4.4  HGB 14.5 14.9  HCT 44.9 47.0  MCV 82.1 83.6  PLT 184 184   Cardiac Enzymes:    Recent Labs Lab 12/16/13 1115 12/16/13 1605 12/16/13 2229  TROPONINI <0.30 <0.30 <0.30   BNP: No components found with this basename: POCBNP,  CBG: No results found for this basename: GLUCAP,  in the last 168 hours   Micro Results: No results found for this or any previous visit (from the past 240 hour(s)).  Studies/Results: Dg Chest 2 View  12/16/2013   CLINICAL DATA:  Chest pain and shortness of breath  EXAM: CHEST  2 VIEW  COMPARISON:   10/27/2013  FINDINGS: No cardiomegaly. Chronic aortic tortuosity. Mild pulmonary hyperinflation. No edema, consolidation, effusion, or pneumothorax. Bilateral nipple shadows. Sclerotic focus in the antral lateral right seventh rib was also seen 03/18/2010, compatible with a benign process.  IMPRESSION: No active cardiopulmonary disease.   Electronically Signed   By: Jonathan  Watts M.D.   On: 12/16/2013 06:37   Mr Brain Wo Contrast  12/16/2013   CLINICAL DATA:  Ataxia.  Hypertension.  EXAM: MRI HEAD WITHOUT CONTRAST  TECHNIQUE: Multiplanar, multiecho pulse sequences of the brain and surrounding structures were obtained without intravenous contrast.  COMPARISON:  CT head without contrast 11/27/2012. MRI brain 07/26/2012.  FINDINGS: The diffusion-weighted images demonstrate no evidence for acute or subacute infarction. Moderate generalized atrophy and diffuse white matter disease is evident bilaterally. There are remote lacunar infarcts within the corona radiata bilaterally. Remote lacunar infarcts are noted in the thalami bilaterally as well as the posterior left putamen. A remote lacunar infarct is present in the right paramedian pons. Remote lacunar infarcts are present in the cerebellum bilaterally, right greater than left. No hemorrhage or mass lesion is present.  Flow is present in the major intracranial arteries. The patient is status post right lens replacement. The globes and orbits are otherwise intact. A T1 hyperintense nodule is present along the floor of the right sphenoid sinus, not present on the prior MRI. There is some adjacent mucosal thickening. The remaining paranasal sinuses and the mastoid air cells are clear.  IMPRESSION: 1. No acute intracranial abnormality. 2. Moderate generalized atrophy and diffuse white matter disease likely reflects the sequelae of chronic microvascular ischemia. 3. Remote lacunar infarcts in the corona radiata, remote lacunar infarcts in the bilateral coronal radiata,  basal ganglia, and cerebellum. 4. Remote lacunar infarct within the right paramedian pons. 7 mm T1 hyperintense nodule of the right sphenoid sinus. This likely represents a proteinaceous cyst. The an early neoplasm is not excluded. Recommend follow-up CT of the sinuses in 2-3 months. These results will be called to the ordering clinician or representative by the Radiologist Assistant, and communication documented in the PACS Dashboard.   Electronically Signed   By: Chris  Mattern M.D.   On: 12/16/2013 10:04    Medications: Scheduled Meds: . amLODipine  10 mg Oral Daily  . aspirin EC  81 mg Oral Daily  . atorvastatin  10 mg Oral q1800  . carvedilol  12.5 mg Oral BID WC  . hydrALAZINE  100 mg Oral 3 times per day  . [START ON 12/19/2013] predniSONE  60 mg Oral Q breakfast  . sodium chloride  3 mL Intravenous Q12H      LOS: 2 days   Ripudeep K Rai M.D. [facility] 12/18/2013, 9:39 AM Pager: [phone]  If 7PM-7AM, please contact night-coverage [email or web address] Password [email or web address]  **Disclaimer: This note was dictated with voice recognition software. Similar sounding words can inadvertently be transcribed and this note may contain transcription errors which may not have been corrected upon publication of note.**   

## 2013-12-18 NOTE — Significant Event (Signed)
Rapid Response Event Note  Overview: Time Called: 1850 Arrival Time: 1853 Event Type: Cardiac;Respiratory  Initial Focused Assessment:  Called by Charge Nurse to evaluate pt who has been tachycardic 140-160s since 1745 hrs, now with midsternal CP. SOB since 1000 hrs.   On arrival to bedside pt appears in moderate distress. Alert, oriented times four, c/o CP midsternal, intermittent, presssure and throbbing sensation, nonradiating, rated 8/10.   Denies diaphoresis, other pain.  Cardiac monitor ST 152  128/86  RR26 on 2l [place] with O2 Sats 98%.    EKG taken at 1810 indicates ST with 1st degree AVBlock rate 137 with Marked ST abnormality, possible inferior subendocardial injury.  Pt received Lopressor 5 mg at  1758 hrs.  Bilateral BS diminished in bilateral bases.  C/O SOB but no further pain with inspiration.   Interventions:  Nitroglycerin 0.4 mg given at 1901 initially relieved CP from 8/10 to zero but returned several minutes later.  Two more doses of NTG 0.4 given per protocol with BP105/7.  No relief of CP which remains at 7/10.  A second dose of Lopressor 5 mg IV was  given at 1858  And ASA 81 mg given at  1916 per order of Dr Tana Coast.  Forrest Moron , NP arrived at 1925 with orders for NItropaste, IV heparin and transfer to Step down unit. Son at bedside updated with plan of care. 1950 Lab tech at bedside to draw Troponin and d-dimer 2015  Report called  and patient transported to 2C06 with  ZOLL, 6/10 CP  101/65  145 ST  26 RR  98% 2l [place]  Event Summary: Name of Physician Notified: William Hamburger, NP at 1900    at    Outcome: Transferred (Comment)     Gust Brooms Melodi Happel

## 2013-12-18 NOTE — Progress Notes (Signed)
ANTICOAGULATION CONSULT NOTE - Initial Consult  Pharmacy Consult for Heparin Indication: chest pain/ACS  No Known Allergies  Patient Measurements: Height: 5\' 10"  (177.8 cm) Weight: 166 lb 9.6 oz (75.569 kg) IBW/kg (Calculated) : 73 Heparin Dosing Weight: 75.6 kg  Vital Signs: Temp: 97.7 F (36.5 C) (05/11 1929) Temp src: Oral (05/11 1929) BP: 101/65 mmHg (05/11 1929) Pulse Rate: 146 (05/11 1929)  Labs:  Recent Labs  12/16/13 0438 12/16/13 0750 12/16/13 1115 12/16/13 1605 12/16/13 2229 12/17/13 0410  HGB 14.5  --   --   --   --  14.9  HCT 44.9  --   --   --   --  47.0  PLT 184  --   --   --   --  184  APTT  --  31  --   --   --   --   LABPROT  --  13.0  --   --   --   --   INR  --  1.00  --   --   --   --   CREATININE 1.39*  --   --   --   --  1.35  TROPONINI  --   --  <0.30 <0.30 <0.30  --     Estimated Creatinine Clearance: 48.1 ml/min (by C-G formula based on Cr of 1.35).   Medical History: Past Medical History  Diagnosis Date  . Poor circulation   . Hernia     umbilical  . Gout   . Glaucoma   . Hypertension     does not see a cardiologist  . Arthritis   . Chronic kidney disease   . Headache(784.0)     " WHEN MY BLOOD PRESSURE IS UP"    Assessment: 59 YOM who presented to the [facility] on 5/9 with hypertensive crisis and was noted this evening to have tachycardia and CP. Pharmacy now consulted to start heparin for ACS sx while awaiting further cardiology evaluation. CE negative x 3 on 5/9, new CEs ordered for this evening. Wt: 75.6 kg, CBC wnl.  Goal of Therapy:  Heparin level 0.3-0.7 units/ml Monitor platelets by anticoagulation protocol: Yes   Plan:  1. Initiate heparin at 900 units/hr (9 ml/hr) 2. Will continue to monitor for any signs/symptoms of bleeding and will follow up with heparin level in 8 hours   Alycia Rossetti, PharmD, BCPS Clinical Pharmacist Pager: [phone] 12/18/2013 8:34 PM

## 2013-12-18 NOTE — Progress Notes (Addendum)
Subjective: HPI: This is a summary of the events since 1900 hrs. Pt was admitted on 12/16/13 with ? Septic knee from gout and hypertension. Ortho saw and low suspicion for sepsis and elected not to tap knee at this time.  Pt has a PMHx of non compliance, dyslipidemia, HTN, gout, and CKD 3 and admits to not taking his meds as prescribed. Tonight, this NP was called by [facility] attending to go see pt as he was having CP since about 0645pm. ASA was given. Lopressor for tachycardia. EKG done and compared to 12/16/13 EKG with noted ST depression. Verified with Dr. Alcario Drought of [facility]. NP to floor at 1905.  Mr. Readus says his CP was 10/10 when started. Completely relieved by 1st NTG SL but now is back at 7/10. Says it feels like something sitting on his chest. Also, some SOB although not severe. Per RN HR in the 160s down to 140s after Lopressor. No fever. ST on tele and on EKG.   Pt was given NTG x 3 with Rapid response present. BP decreased to 110s after NTG. NTG paste and MSO4 ordered with parameters for BP. Stat Ddimer and troponin ordered. Troponin normal on 12/16/13. Heparin gtt ordered in light of suspicion of ACS vs. PE. Creat 1.35.Pt denies hx of GIB. Takes about 2 Goody powders a day at home.  Will likely need VQ scan if DDimer elevated secondary to CKD 3.  Extra dose of Lipitor 40 given (pt on 10mg ) and started on 40mg  qd.  Pt transferred to SDU for closer monitoring. Son updated at bedside. Discussed plan with pt.  FULL CODE.   Objective: Vital signs in last 24 hours: Temp:  [97.6 F (36.4 C)-98.2 F (36.8 C)] 97.7 F (36.5 C) (05/11 1929) Pulse Rate:  [72-167] 146 (05/11 1929) Resp:  [15-22] 22 (05/11 1929) BP: (101-168)/(65-101) 101/65 mmHg (05/11 1929) SpO2:  [97 %-99 %] 97 % (05/11 1929) Weight:  [75.569 kg (166 lb 9.6 oz)] 75.569 kg (166 lb 9.6 oz) (05/11 0500) Weight change: 0.091 kg (3.2 oz)    Intake/Output from previous day: 05/10 0701 - 05/11 0700 In: -  Out: 625  [Urine:625] Intake/Output this shift: Total I/O In: -  Out: 100 [Urine:100]  Gen: Elderly AAM in mild distress. Grimacing with pain. VS reviewed. Alert and oriented.  CARD: S1S2 without MRG. Sinus tach. Holds central sternum. Pain is not reproducible on exam.  Resp: normal effort. Satting normally on 2L O2 per [place].   Lab Results:  Recent Labs  12/16/13 0438 12/17/13 0410  WBC 4.5 4.4  HGB 14.5 14.9  HCT 44.9 47.0  PLT 184 184   BMET  Recent Labs  12/16/13 0438 12/17/13 0410  NA 142 141  K 3.7 4.0  CL 104 107  CO2 25 24  GLUCOSE 107* 101*  BUN 13 14  CREATININE 1.39* 1.35  CALCIUM 8.5 8.1*    Studies/Results: Ct Head Wo Contrast  12/18/2013   CLINICAL DATA:  Headache with right arm numbness.  EXAM: CT HEAD WITHOUT CONTRAST  TECHNIQUE: Contiguous axial images were obtained from the base of the skull through the vertex without contrast.  COMPARISON:  MR brain 12/16/2013.  FINDINGS: No evidence for acute infarction, hemorrhage, mass lesion, hydrocephalus, or extra-axial fluid. Generalized atrophy. Chronic microvascular ischemic change affects the periventricular and subcortical white matter. Tiny remote cerebellar infarcts. Calvarium intact. No sinus or mastoid disease.  IMPRESSION: Atrophy and small vessel disease.  No acute intracranial findings.   Electronically Signed  By: Rolla Flatten M.D.   On: 12/18/2013 12:54   Dg Knee Complete 4 Views Right  12/18/2013   CLINICAL DATA:  Right knee pain.  No injury.  EXAM: RIGHT KNEE - COMPLETE 4+ VIEW  COMPARISON:  09/08/2008  FINDINGS: Advanced tricompartment degenerative changes with joint space narrowing, spurring, progressed since prior study. Large joint effusion noted, similar to prior study. Chondrocalcinosis noted. No acute bony abnormality. Specifically, no fracture, subluxation, or dislocation. Soft tissues are intact.  IMPRESSION: Advanced tricompartment degenerative changes with chondrocalcinosis and large joint effusion. No  acute bony abnormality.   Electronically Signed   By: Rolm Baptise M.D.   On: 12/18/2013 12:34    Medications: reviewed  Assessment/Plan: 1. Chest pain-awaiting Ddimer and troponin. Finally relieved after 3 NTG. ASA 325 given. Suspect ACS with EKG changes but will r/o PE as necessary. Cardiology called for consult. ASA given, Lipitor increased, Heparin started, MSO4 for pain, NTG paste as BP tolerates, O2, r/p EKG in 4 hours. Pt is on Coreg, Norvasc, Lipitor and Hydralazine.  2. Hypertensive emergency on admission-BP down now. May need to hold hydralazine.  3. Sinus tach-likely due to pain. Sepsis r/o. No arrythmias noted. Continue to follow.  NPO except meds until we see cards plan. FULL CODE.   Ddimer back and is elevated. Get VQ scan.  Addendum: CT called and pt can have CTA chest instead of VQ. Cardio at bedside. Pt in SVT, Adenosine given by cardio. 1st troponin neg. See cardio consult.   LOS: 2 days   Gardiner Barefoot 12/18/2013, 8:47 PM

## 2013-12-18 NOTE — Consult Note (Signed)
CARDIOLOGY CONSULT NOTE  Patient ID: Trevor Murphy MRN: [id number] DOB/AGE: 10-28-1934 79 y.o.  Admit date: 12/16/2013 Primary Cardiologist: New Reason for Consultation: Chest pain  HPI: 79 yo with history of prior CVA, HTN, and CKD admitted 5/9 with headache and blurry vision.  BP was 220/130.  He had been out of his medications.  He had some chest pain as well.  Cardiac enzymes were negative.  He was treated for a hypertensive crisis and BP was gradually lowered.  He also had what is probably a right knee gout flare.  This evening around 5:30, he became tachycardic.  He was given IV Lopressor and transferred to step-down.  He had substernal chest pain during this for about 2 hours, it resolved after he got NTG paste.  He stayed tachycardic so cardiology was called around 8:30.  The patient was in SVT in 140s.  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  I gave him adenosine 6 mg IV with conversion back to NSR in the 80s.    Patient felt better after resumption of NSR.  He does also report dyspnea and has been using oxygen here.   Review of systems complete and found to be negative unless listed above in HPI  Past Medical History: 1. Umbilical hernia s/p repair. 2. HTN 3. CKD: Likely from HTN 4. Glaucoma 5. CVA 6. Left eye blindness 7. Diastolic CHF: Echo (123456) with EF 55-60%, mild AI, mild MR.  8. Gout 9. Noncompliance 10. SVT: Got adenosine from EMS earlier this year for SVT.   History reviewed. No pertinent family history.  History   Social History  . Marital Status: Married    Spouse Name: N/A    Number of Children: N/A  . Years of Education: N/A   Occupational History  . Not on file.   Social History Main Topics  . Smoking status: Never Smoker   . Smokeless tobacco: Never Used  . Alcohol Use: 0.6 oz/week    1 Cans of beer per week     Comment: occassional  . Drug Use: No  . Sexual Activity:    Other Topics Concern  . Not on file   Social History  Narrative  . No narrative on file    Current Scheduled Meds: . allopurinol  150 mg Oral Daily  . amLODipine  10 mg Oral Daily  . aspirin  325 mg Oral Daily  . [START ON 12/19/2013] atorvastatin  40 mg Oral q1800  . atorvastatin  40 mg Oral Once  . carvedilol  12.5 mg Oral BID WC  . [START ON 12/19/2013] colchicine  0.6 mg Oral Daily  . hydrALAZINE  100 mg Oral 3 times per day  . metoprolol      . nitroGLYCERIN  0.5 inch Topical 4 times per day  . [START ON 12/19/2013] predniSONE  60 mg Oral Q breakfast  . sodium chloride  3 mL Intravenous Q12H   Continuous Infusions: . heparin 900 Units/hr (12/18/13 2101)   PRN Meds:.guaiFENesin-dextromethorphan, hydrALAZINE, HYDROcodone-acetaminophen, morphine injection, nitroGLYCERIN, ondansetron (ZOFRAN) IV, ondansetron, polyethylene glycol, polyvinyl alcohol  Prescriptions prior to admission  Medication Sig Dispense Refill  . carvedilol (COREG) 6.25 MG tablet Take 1 tablet (6.25 mg total) by mouth 2 (two) times daily with a meal.  60 tablet  0  . [DISCONTINUED] rosuvastatin (CRESTOR) 5 MG tablet Take 1 tablet (5 mg total) by mouth daily.  30 tablet  0    Physical exam Blood pressure 101/65, pulse 146,  temperature 97.7 F (36.5 C), temperature source Oral, resp. rate 22, height 5\' 10"  (1.778 m), weight 75.569 kg (166 lb 9.6 oz), SpO2 97.00%. General: NAD Neck: JVP 8-9 cm, no thyromegaly or thyroid nodule.  Lungs: Slight crackles at bases bilaterally. CV: Nondisplaced PMI.  Heart tachycardic, regular S1/S2, no S3/S4, no murmur.  No peripheral edema.  No carotid bruit.  Normal pedal pulses.  Abdomen: Soft, nontender, no hepatosplenomegaly, no distention.  Skin: Intact without lesions or rashes.  Neurologic: Alert and oriented x 3.  Psych: Normal affect. Extremities: No clubbing or cyanosis.  HEENT: Normal.   Labs:   Lab Results  Component Value Date   WBC 4.4 12/17/2013   HGB 14.9 12/17/2013   HCT 47.0 12/17/2013   MCV 83.6 12/17/2013    PLT 184 12/17/2013    Recent Labs Lab 12/17/13 0410  NA 141  K 4.0  CL 107  CO2 24  BUN 14  CREATININE 1.35  CALCIUM 8.1*  GLUCOSE 101*   Lab Results  Component Value Date   CKTOTAL 93 03/19/2010   CKMB 2.4 03/19/2010   TROPONINI <0.30 12/18/2013      Radiology:  - MRI head: No acute stroke, old lacunar infarcts present.   EKG: Tachycardic with SVT (short RP, ?AVNRT), anterolateral ST depression  ASSESSMENT AND PLAN:  79 yo with history of HTN, CKD, and CVA was admitted with hypertensive emergency and CP/headache.  After BP control, he developed SVT that resolved with adenosine.  He had more CP with SVT.  1. SVT: Patient was in SVT for around 3 hours tonight.  He had chest pain during this, now CP-free.  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   He converted to NSR with adenosine 6 mg.  - Increase Coreg to 18.75 mg bid. - If he has recurrences despite medical treatment, can consider SVT ablation.  2. Chest pain: Initially in setting of hypertensive crisis, this evening in setting of persistent tachycardia.  Now resolved.  Cardiac enzymes so far negative.  D dimer was positive.  - CTA chest has been ordered by [facility] to rule out PE.  - Cycle cardiac enzymes.  If CEs remain negative, would consider outpatient Cardiolite rather than cardiac cath given CKD (he is going to be getting a contrast load already for CTA).  - Continue ASA, statin, heparin gtt for now until rules out by negative troponin and we see that there is no PE on CTA chest.  3. HTN: Control is improving.  He has been noncompliant at home.  As above, will increase Coreg.  4. CKD: Stable.  Likely hypertensive nephrosclerosis.  5. Acute on chronic diastolic CHF: Await echo.  He has some volume overload and is short of breath.  I will give Lasix 40 mg IV x 1 now and follow response.   Larey Dresser 12/18/2013 9:39 PM

## 2013-12-19 DIAGNOSIS — I471 Supraventricular tachycardia: Secondary | ICD-10-CM | POA: Diagnosis present

## 2013-12-19 DIAGNOSIS — M25561 Pain in right knee: Secondary | ICD-10-CM | POA: Diagnosis present

## 2013-12-19 DIAGNOSIS — M109 Gout, unspecified: Secondary | ICD-10-CM | POA: Diagnosis present

## 2013-12-19 DIAGNOSIS — I359 Nonrheumatic aortic valve disorder, unspecified: Secondary | ICD-10-CM

## 2013-12-19 DIAGNOSIS — R7989 Other specified abnormal findings of blood chemistry: Secondary | ICD-10-CM | POA: Diagnosis present

## 2013-12-19 DIAGNOSIS — M79609 Pain in unspecified limb: Secondary | ICD-10-CM

## 2013-12-19 DIAGNOSIS — R778 Other specified abnormalities of plasma proteins: Secondary | ICD-10-CM | POA: Diagnosis present

## 2013-12-19 LAB — COMPREHENSIVE METABOLIC PANEL
ALT: 8 U/L (ref 0–53)
AST: 19 U/L (ref 0–37)
Albumin: 3.1 g/dL — ABNORMAL LOW (ref 3.5–5.2)
Alkaline Phosphatase: 98 U/L (ref 39–117)
BUN: 21 mg/dL (ref 6–23)
CO2: 25 mEq/L (ref 19–32)
Calcium: 8.8 mg/dL (ref 8.4–10.5)
Chloride: 101 mEq/L (ref 96–112)
Creatinine, Ser: 1.65 mg/dL — ABNORMAL HIGH (ref 0.50–1.35)
GFR calc Af Amer: 45 mL/min — ABNORMAL LOW (ref >90)
GFR calc non Af Amer: 39 mL/min — ABNORMAL LOW (ref >90)
Glucose, Bld: 98 mg/dL (ref 70–99)
Potassium: 4.3 mEq/L (ref 3.7–5.3)
Sodium: 138 mEq/L (ref 137–147)
Total Bilirubin: 0.6 mg/dL (ref 0.3–1.2)
Total Protein: 7.5 g/dL (ref 6.0–8.3)

## 2013-12-19 LAB — TROPONIN I
Troponin I: 0.32 ng/mL (ref <0.30)
Troponin I: 0.56 ng/mL (ref <0.30)
Troponin I: 0.52 ng/mL (ref <0.30)
Troponin I: 0.36 ng/mL (ref <0.30)

## 2013-12-19 LAB — CBC
HCT: 47.3 % (ref 39.0–52.0)
Hemoglobin: 15.5 g/dL (ref 13.0–17.0)
MCH: 27.1 pg (ref 26.0–34.0)
MCHC: 32.8 g/dL (ref 30.0–36.0)
MCV: 82.7 fL (ref 78.0–100.0)
Platelets: 205 10*3/uL (ref 150–400)
RBC: 5.72 MIL/uL (ref 4.22–5.81)
RDW: 16.9 % — ABNORMAL HIGH (ref 11.5–15.5)
WBC: 4.8 10*3/uL (ref 4.0–10.5)

## 2013-12-19 LAB — HEPARIN LEVEL (UNFRACTIONATED): Heparin Unfractionated: 0.12 IU/mL — ABNORMAL LOW (ref 0.30–0.70)

## 2013-12-19 MED ORDER — PREDNISONE 20 MG PO TABS
40.0000 mg | ORAL_TABLET | Freq: Every day | ORAL | Status: DC
  Administered 2013-12-20 – 2013-12-21 (×2): 40 mg via ORAL
  Filled 2013-12-19 (×3): qty 2

## 2013-12-19 MED ORDER — PREDNISONE 50 MG PO TABS
60.0000 mg | ORAL_TABLET | Freq: Every day | ORAL | Status: AC
  Administered 2013-12-19: 60 mg via ORAL
  Filled 2013-12-19: qty 1

## 2013-12-19 NOTE — Progress Notes (Signed)
*  PRELIMINARY RESULTS* Vascular Ultrasound Lower extremity venous duplex has been completed.  Preliminary findings: no evidence of DVT or baker's cyst.   Landry Mellow, RDMS, RVT  12/19/2013, 11:47 AM

## 2013-12-19 NOTE — Progress Notes (Signed)
Patient ID: Trevor Murphy  male  [id number]    DOB: 08-01-1935    DOA: 12/16/2013  PCP: Maximino Greenland, MD   Interim history  Patient is a 79 year old male with history of hypertension, chronic kidney disease, gout, osteoarthritis, noncompliant with his medications, was admitted with accelerated hypertension crisis, headache and blurry vision. He had been out of his medications. He was started on beta blocker, hydralazine, and Norvasc.  BP was gradually lowered. MRI of the brain was negative for any acute CVA. On 12/18/2013, patient complained of right knee pain. Right knee was swollen and x-rays were positive for joint effusion, CRP elevated uric acid. Patient was started on colchicine, prednisone and allopurinol. Orthopedics was consulted (please see my note below). Later in the evening, patient started becoming tachycardiac, complaining of chest pain and shortness of breath. EKG showed sinus tachycardia with first-degree AV block, marked ST abnormality possibly inferior subendocardial injury. He was given Lopressor x2 with minimal improvement in the heart rate. He was given aspirin, nitroglycerin which did improve his chest pain but returned again. He was transferred to step down unit. Cardiology was consulted and patient received adenosine 6 mg IV with conversion back to normal sinus rhythm in 80's. D-dimer was elevated, CTAchest was negative for PE. 2-D echo has been ordered. Patient also received IV furosemide. Plan to transfer back to tele floor today, start physical therapy and hopefully DC home in a.m. if no acute issues again.  Assessment/Plan: Principal Problem:   Hypertensive crisis Active Problems:   CKD (chronic kidney disease) stage 3, GFR 30-59 ml/min   CVA (cerebral infarction)   Dyslipidemia   Chest pain   SVT (supraventricular tachycardia)   Elevated troponin   Gout attack   Knee pain, right  SVT with elevated troponin, dyspnea - Resolved after 6 mg IV adenosine,  currently normal sinus rhythm, heart rate in 80s - Troponin elevated to 0.56 this morning, per cardiology likely demand ischemia - 2-D echocardiogram pending, currently on heparin drip, cardiology following - Denies any chest pain or shortness of breath at this time. - Outpatient stress test scheduled on 01/03/2014 at 11:45 AM, cardiology appointment on May 22 at 11:15 AM with Dr. Aundra Dubin  Hypertensive crisis with headache and blurry vision: Noncompliant with his medications - Repeat CT head again done yesterday secondary to right hand numbness was negative for acute stroke, continue aspirin  - MRI on 5/9 negative for acute CVA, no focal neurological deficits  - Coreg increased to 18.75 BID, hydralazine $RemoveBeforeDE'100mg'JUbJVifZmdBGjOf$  TID, Norvasc 10 mg daily  - PT evaluation today  Right knee pain with acute gout attack: Improving, ROM improving today - X-ray of the right knee advanced tricompartmental degenerative changes with chondrocalcinosis and large joint effusion, no bony abnormality.  - ESR normal, CRP elevated at 8.0 , uric acid elevated at 9.5  - Placed on allopurinol, colchicine, pain control, prednisone , taper tomorrow to 40 mg x3 days - Discussed with Dr. Marcelino Scot, orthopedics, does not need to tap if not septic arthritis. Recommended to observe, if significant improvement, then they will see him in the office in one week. If not, to call back and cortisone shot in the knee on Wednesday. - Doppler of the lower extremity to rule out any DVT in right leg  CKD (chronic kidney disease) stage 3, GFR 30-59 ml/min: Likely due to uncontrolled hypertensive nephropathy  - Creatinine worsened to 1.65 today likely due to furosemide and acute issues yesterday, follow BMET in a.m.  History  of CVA (cerebral infarction)  -Continue aspirin 324m daily, statin   Dyslipidemia  - Continue statin   DVT Prophylaxis:  Code Status: Full code  Family Communication: Discussed in detail with the patient  Disposition:  Hopefully DC home in a.m., patient will need all the prescription.   Consultants:  Cardiology, Dr. MAundra Dubin Orthopedics, Dr. HMarcelino Scot Procedures:  None  Antibiotics:  None    Subjective: Patient seen and examined in stepdown today, feeling better today, no chest pain or shortness of breath this morning. Right knee swelling is improving, patient is able to move the right leg better.  Objective: Weight change: 0.031 kg (1.1 oz)  Intake/Output Summary (Last 24 hours) at 12/19/13 0928 Last data filed at 12/19/13 0751  Gross per 24 hour  Intake    790 ml  Output   1000 ml  Net   -210 ml   Blood pressure 127/70, pulse 71, temperature 98.1 F (36.7 C), temperature source Oral, resp. rate 17, height 5' 10"  (1.778 m), weight 75.6 kg (166 lb 10.7 oz), SpO2 92.00%.  Physical Exam: General: Alert and awake, oriented x3, not in any acute distress. HEENT: anicteric sclera, PERLA, EOMI CVS: S1-S2 clear, no murmur rubs or gallops Chest: Decreased breath sounds at the bases Abdomen: soft nontender, nondistended, normal bowel sounds  Extremities: no cyanosis, clubbing or edema noted bilaterally, right knee swollen but improving,ROM improving Neuro: Cranial nerves II-XII intact, no focal neurological deficits  Lab Results: Basic Metabolic Panel:  Recent Labs Lab 12/17/13 0410 12/19/13 0308  NA 141 138  K 4.0 4.3  CL 107 101  CO2 24 25  GLUCOSE 101* 98  BUN 14 21  CREATININE 1.35 1.65*  CALCIUM 8.1* 8.8   Liver Function Tests:  Recent Labs Lab 12/19/13 0308  AST 19  ALT 8  ALKPHOS 98  BILITOT 0.6  PROT 7.5  ALBUMIN 3.1*   No results found for this basename: LIPASE, AMYLASE,  in the last 168 hours No results found for this basename: AMMONIA,  in the last 168 hours CBC:  Recent Labs Lab 12/17/13 0410 12/19/13 0308  WBC 4.4 4.8  HGB 14.9 15.5  HCT 47.0 47.3  MCV 83.6 82.7  PLT 184 205   Cardiac Enzymes:  Recent Labs Lab 12/16/13 2229 12/18/13 1950  12/19/13 0308  TROPONINI <0.30 <0.30 0.32*   BNP: No components found with this basename: POCBNP,  CBG: No results found for this basename: GLUCAP,  in the last 168 hours   Micro Results: Recent Results (from the past 240 hour(s))  MRSA PCR SCREENING     Status: None   Collection Time    12/18/13  8:25 PM      Result Value Ref Range Status   MRSA by PCR NEGATIVE  NEGATIVE Final   Comment:            The GeneXpert MRSA Assay (FDA     approved for NASAL specimens     only), is one component of a     comprehensive MRSA colonization     surveillance program. It is not     intended to diagnose MRSA     infection nor to guide or     monitor treatment for     MRSA infections.    Studies/Results: Dg Chest 2 View  12/16/2013   CLINICAL DATA:  Chest pain and shortness of breath  EXAM: CHEST  2 VIEW  COMPARISON:  10/27/2013  FINDINGS: No cardiomegaly. Chronic aortic tortuosity. Mild  pulmonary hyperinflation. No edema, consolidation, effusion, or pneumothorax. Bilateral nipple shadows. Sclerotic focus in the antral lateral right seventh rib was also seen 03/18/2010, compatible with a benign process.  IMPRESSION: No active cardiopulmonary disease.   Electronically Signed   By: Jorje Guild M.D.   On: 12/16/2013 06:37   Ct Head Wo Contrast  12/18/2013   CLINICAL DATA:  Headache with right arm numbness.  EXAM: CT HEAD WITHOUT CONTRAST  TECHNIQUE: Contiguous axial images were obtained from the base of the skull through the vertex without contrast.  COMPARISON:  MR brain 12/16/2013.  FINDINGS: No evidence for acute infarction, hemorrhage, mass lesion, hydrocephalus, or extra-axial fluid. Generalized atrophy. Chronic microvascular ischemic change affects the periventricular and subcortical white matter. Tiny remote cerebellar infarcts. Calvarium intact. No sinus or mastoid disease.  IMPRESSION: Atrophy and small vessel disease.  No acute intracranial findings.   Electronically Signed   By: Rolla Flatten M.D.   On: 12/18/2013 12:54   Ct Angio Chest Pe W/cm &/or Wo Cm  12/18/2013   CLINICAL DATA:  Shortness of breath shortness of Breath  EXAM: CT ANGIOGRAPHY CHEST WITH CONTRAST  TECHNIQUE: Multidetector CT imaging of the chest was performed using the standard protocol during bolus administration of intravenous contrast. Multiplanar CT image reconstructions and MIPs were obtained to evaluate the vascular anatomy.  CONTRAST:  19m OMNIPAQUE IOHEXOL 350 MG/ML SOLN  COMPARISON:  None.  FINDINGS: The lungs are well aerated bilaterally without focal infiltrate or sizable effusion. The thoracic aorta show some calcifications although no aneurysmal dilatation or dissection is identified. Pulmonary artery is well visualized and demonstrates a normal branching pattern. No findings to suggest pulmonary emboli are identified. The hilar and mediastinal structures show no significant lymphadenopathy. No right heart strain is seen. Visualized upper abdomen is within normal limits with the exception of some left renal cystic change. Degenerative change of the thoracolumbar spine is noted.  Review of the MIP images confirms the above findings.  IMPRESSION: No evidence of pulmonary emboli.  Left renal cyst   Electronically Signed   By: MInez CatalinaM.D.   On: 12/18/2013 23:10   Mr Brain Wo Contrast  12/16/2013   CLINICAL DATA:  Ataxia.  Hypertension.  EXAM: MRI HEAD WITHOUT CONTRAST  TECHNIQUE: Multiplanar, multiecho pulse sequences of the brain and surrounding structures were obtained without intravenous contrast.  COMPARISON:  CT head without contrast 11/27/2012. MRI brain 07/26/2012.  FINDINGS: The diffusion-weighted images demonstrate no evidence for acute or subacute infarction. Moderate generalized atrophy and diffuse white matter disease is evident bilaterally. There are remote lacunar infarcts within the corona radiata bilaterally. Remote lacunar infarcts are noted in the thalami bilaterally as well as the  posterior left putamen. A remote lacunar infarct is present in the right paramedian pons. Remote lacunar infarcts are present in the cerebellum bilaterally, right greater than left. No hemorrhage or mass lesion is present.  Flow is present in the major intracranial arteries. The patient is status post right lens replacement. The globes and orbits are otherwise intact. A T1 hyperintense nodule is present along the floor of the right sphenoid sinus, not present on the prior MRI. There is some adjacent mucosal thickening. The remaining paranasal sinuses and the mastoid air cells are clear.  IMPRESSION: 1. No acute intracranial abnormality. 2. Moderate generalized atrophy and diffuse white matter disease likely reflects the sequelae of chronic microvascular ischemia. 3. Remote lacunar infarcts in the corona radiata, remote lacunar infarcts in the bilateral coronal radiata, basal ganglia,  and cerebellum. 4. Remote lacunar infarct within the right paramedian pons. 7 mm T1 hyperintense nodule of the right sphenoid sinus. This likely represents a proteinaceous cyst. The an early neoplasm is not excluded. Recommend follow-up CT of the sinuses in 2-3 months. These results will be called to the ordering clinician or representative by the Radiologist Assistant, and communication documented in the PACS Dashboard.   Electronically Signed   By: Lawrence Santiago M.D.   On: 12/16/2013 10:04   Dg Knee Complete 4 Views Right  12/18/2013   CLINICAL DATA:  Right knee pain.  No injury.  EXAM: RIGHT KNEE - COMPLETE 4+ VIEW  COMPARISON:  09/08/2008  FINDINGS: Advanced tricompartment degenerative changes with joint space narrowing, spurring, progressed since prior study. Large joint effusion noted, similar to prior study. Chondrocalcinosis noted. No acute bony abnormality. Specifically, no fracture, subluxation, or dislocation. Soft tissues are intact.  IMPRESSION: Advanced tricompartment degenerative changes with chondrocalcinosis and  large joint effusion. No acute bony abnormality.   Electronically Signed   By: Rolm Baptise M.D.   On: 12/18/2013 12:34    Medications: Scheduled Meds: . allopurinol  150 mg Oral Daily  . amLODipine  10 mg Oral Daily  . aspirin  325 mg Oral Daily  . atorvastatin  40 mg Oral q1800  . carvedilol  18.75 mg Oral BID WC  . colchicine  0.6 mg Oral Daily  . hydrALAZINE  100 mg Oral 3 times per day  . [START ON 12/20/2013] predniSONE  40 mg Oral Q breakfast  . sodium chloride  3 mL Intravenous Q12H      LOS: 3 days   Linzi Ohlinger Krystal Eaton M.D. [facility] 12/19/2013, 9:28 AM Pager: [phone]  If 7PM-7AM, please contact night-coverage [email or web address] Password [email or web address]  **Disclaimer: This note was dictated with voice recognition software. Similar sounding words can inadvertently be transcribed and this note may contain transcription errors which may not have been corrected upon publication of note.**

## 2013-12-19 NOTE — Progress Notes (Signed)
ANTICOAGULATION CONSULT NOTE - Follow Up Consult  Pharmacy Consult for heparin Indication: chest pain/ACS  Labs:  Recent Labs  12/16/13 0750  12/16/13 1605 12/16/13 2229 12/17/13 0410 12/18/13 1950 12/19/13 0308  HGB  --   --   --   --  14.9  --  15.5  HCT  --   --   --   --  47.0  --  47.3  PLT  --   --   --   --  184  --  205  APTT 31  --   --   --   --   --   --   LABPROT 13.0  --   --   --   --   --   --   INR 1.00  --   --   --   --   --   --   HEPARINUNFRC  --   --   --   --   --   --  0.12*  CREATININE  --   --   --   --  1.35  --   --   TROPONINI  --   < > <0.30 <0.30  --  <0.30  --   < > = values in this interval not displayed.   Assessment: 79yo male subtherapeutic on heparin with initial dosing for CP.  CT negative for PE.  Goal of Therapy:  Heparin level 0.3-0.7 units/ml   Plan:  Will increase heparin gtt by 4 units/kg/hr to 1200 units/hr and check level in [place], PharmD, BCPS  12/19/2013,4:39 AM

## 2013-12-19 NOTE — Progress Notes (Signed)
UR completed. Patient changed to inpatient- required IV adenosine

## 2013-12-19 NOTE — Progress Notes (Signed)
Came to visit patient to explain and offer [facility] services. Received a referral from ED RNCM in the past. Writer has attempted to reach patient on multiple occassions prior to admission to offer [facility] [facility] services but was unsuccessful. During bedside visit patient endorses that he has not been home. Reports his cell number is the best way to reach him at [phone]. Patient agreeable to services and consents obtained. However, he wants Probation officer to speak with his daughter as well. Called daughter, Verdis Prime, at [phone], while in room. There was no answer. Nonetheless, supplied patient with contact information for his daughter to call with questions. Patient will receive post hospital discharge call and will be evaluated for monthly home visits. Patient states his daughter usually helps him with his medications. States his wife was recently placed in a nursing home. He reports " I can use all the help I can get". Will make inpatient RNCM aware that [place] [facility] to follow.  Marthenia Rolling, MSN- RN, [facility] Liaison435 [phone]

## 2013-12-19 NOTE — Progress Notes (Signed)
  Echocardiogram 2D Echocardiogram has been performed.  Trevor Murphy 12/19/2013, 3:40 PM

## 2013-12-19 NOTE — Evaluation (Signed)
Physical Therapy Evaluation Patient Details Name: Trevor Murphy MRN: [id number] DOB: 1935-05-25 Today's Date: 12/19/2013   History of Present Illness  Admitted with HTN Crisis, likely result of not having taken BP meds since he ahd run out; reported HA, and blurry vision; states that's how he feels when his BP is too high; PT ordered 5/12 in anticipation of dc 5/13  Past Medical History  Diagnosis Date  . Poor circulation   . Hernia     umbilical  . Gout   . Glaucoma   . Hypertension     does not see a cardiologist  . Arthritis   . Chronic kidney disease   . Headache(784.0)     " WHEN MY BLOOD PRESSURE IS UP"     Clinical Impression  Pt admitted with above. Pt currently with functional limitations due to the deficits listed below (see PT Problem List).  Pt will benefit from skilled PT to increase their independence and safety with mobility to allow discharge to the venue listed below.       Follow Up Recommendations Home health PT;Other (comment) [facility] for chronic disease/HTN monitor and  management)    Equipment Recommendations  None recommended by PT    Recommendations for Other Services       Precautions / Restrictions Precautions Precautions: Fall Precaution Comments: Blind R eye      Mobility  Bed Mobility Overal bed mobility: Needs Assistance Bed Mobility: Supine to Sit     Supine to sit: Supervision     General bed mobility comments: Noted used bedrails  Transfers Overall transfer level: Needs assistance Equipment used: Straight cane Transfers: Sit to/from Stand Sit to Stand: Min assist         General transfer comment: Noted dependent on UEs for push/support  Ambulation/Gait Ambulation/Gait assistance: Min assist;Min guard Ambulation Distance (Feet): 50 Feet Assistive device: Straight cane;Rolling walker (2 wheeled)       General Gait Details: Initially walked with pt's straight cane, and min handheld assist for steadying; Then walked with  RW for bil UE suport and pt much mroe steady; he agrees to use RW for safety with amb  Stairs            Wheelchair Mobility    Modified Rankin (Stroke Patients Only)       Balance Overall balance assessment: Needs assistance   Sitting balance-Leahy Scale: Good     Standing balance support: Single extremity supported Standing balance-Leahy Scale: Fair Standing balance comment: Much better balance with UE support                             Pertinent Vitals/Pain BP 134/66 post amb no apparent distress     Home Living Family/patient expects to be discharged to:: Private residence Living Arrangements: Spouse/significant other (however pt reports wife is at [facility] for [facility]) Available Help at Discharge: Family;Available PRN/intermittently (REports adult children check on him) Type of Home: House Home Access: Stairs to enter Entrance Stairs-Rails: None Entrance Stairs-Number of Steps: 2 Home Layout: One level Home Equipment: Walker - 2 wheels;Walker - 4 wheels;Shower seat      Prior Function Level of Independence: Independent with assistive device(s)         Comments: Cane and RW prn; gets Meals on [employer] service     Hand Dominance        Extremity/Trunk Assessment   Upper Extremity Assessment: Overall WFL for tasks assessed  Lower Extremity Assessment: Generalized weakness         Communication   Communication: No difficulties  Cognition Arousal/Alertness: Awake/alert Behavior During Therapy: WFL for tasks assessed/performed Overall Cognitive Status: Within Functional Limits for tasks assessed                      General Comments      Exercises        Assessment/Plan    PT Assessment Patient needs continued PT services  PT Diagnosis Difficulty walking;Generalized weakness   PT Problem List Decreased strength;Decreased activity tolerance;Decreased balance;Decreased mobility;Decreased knowledge of use  of DME  PT Treatment Interventions DME instruction;Gait training;Stair training;Functional mobility training;Therapeutic activities;Therapeutic exercise;Balance training;Patient/family education   PT Goals (Current goals can be found in the Care Plan section) Acute Rehab PT Goals Patient Stated Goal: Wants to go home PT Goal Formulation: With patient Time For Goal Achievement: 12/26/13 Potential to Achieve Goals: Good    Frequency Min 3X/week   Barriers to discharge Decreased caregiver support      Co-evaluation               End of Session Equipment Utilized During Treatment: Gait belt Activity Tolerance: Patient tolerated treatment well Patient left: in chair;with nursing/sitter in room;Other (comment) (Prepping to move to his room in 2W) Nurse Communication: Mobility status    Functional Assessment Tool Used: Clinical Judgement Functional Limitation: Mobility: Walking and moving around Mobility: Walking and Moving Around Current Status [phone]): At least 20 percent but less than 40 percent impaired, limited or restricted Mobility: Walking and Moving Around Goal Status [phone]): 0 percent impaired, limited or restricted    Time: 1251-1309 PT Time Calculation (min): 18 min   Charges:   PT Evaluation $Initial PT Evaluation Tier I: 1 Procedure PT Treatments $Gait Training: 8-22 mins   PT G Codes:   Functional Assessment Tool Used: Clinical Judgement Functional Limitation: Mobility: Walking and moving around    [facility] 12/19/2013, 4:18 PM  Roney Marion, [place] Pager [phone] Office [phone]

## 2013-12-19 NOTE — Progress Notes (Signed)
Subjective: No CP  NO SOB   Objective: Filed Vitals:   12/19/13 0435 12/19/13 0500 12/19/13 0628 12/19/13 0700  BP: 142/77 148/90 134/78 141/66  Pulse:  78  83  Temp: 97.8 F (36.6 C)     TempSrc: Oral     Resp:  11  17  Height:      Weight: 166 lb 10.7 oz (75.6 kg)     SpO2:  97%  97%   Weight change: 1.1 oz (0.031 kg)  Intake/Output Summary (Last 24 hours) at 12/19/13 0754 Last data filed at 12/19/13 0751  Gross per 24 hour  Intake    790 ml  Output   1000 ml  Net   -210 ml    General: Alert, awake, oriented x3, in no acute distress Neck:  JVP is normal Heart: Regular rate and rhythm, without murmurs, rubs, gallops.  Lungs: Clear to auscultation.  No rales or wheezes. Exemities:  No edema.   Neuro: Grossly intact, nonfocal.  Tele:  SR   Lab Results: Results for orders placed during the hospital encounter of 12/16/13 (from the past 24 hour(s))  SEDIMENTATION RATE     Status: None   Collection Time    12/18/13 11:00 AM      Result Value Ref Range   Sed Rate 2  0 - 16 mm/hr  C-REACTIVE PROTEIN     Status: Abnormal   Collection Time    12/18/13 11:00 AM      Result Value Ref Range   CRP 8.0 (*) <0.60 mg/dL  URIC ACID     Status: Abnormal   Collection Time    12/18/13 11:00 AM      Result Value Ref Range   Uric Acid, Serum 9.5 (*) 4.0 - 7.8 mg/dL  TROPONIN I     Status: None   Collection Time    12/18/13  7:50 PM      Result Value Ref Range   Troponin I <0.30  <0.30 ng/mL  D-DIMER, QUANTITATIVE     Status: Abnormal   Collection Time    12/18/13  7:50 PM      Result Value Ref Range   D-Dimer, Quant 1.43 (*) 0.00 - 0.48 ug/mL-FEU  MRSA PCR SCREENING     Status: None   Collection Time    12/18/13  8:25 PM      Result Value Ref Range   MRSA by PCR NEGATIVE  NEGATIVE  HEPARIN LEVEL (UNFRACTIONATED)     Status: Abnormal   Collection Time    12/19/13  3:08 AM      Result Value Ref Range   Heparin Unfractionated 0.12 (*) 0.30 - 0.70 IU/mL  COMPREHENSIVE  METABOLIC PANEL     Status: Abnormal   Collection Time    12/19/13  3:08 AM      Result Value Ref Range   Sodium 138  137 - 147 mEq/L   Potassium 4.3  3.7 - 5.3 mEq/L   Chloride 101  96 - 112 mEq/L   CO2 25  19 - 32 mEq/L   Glucose, Bld 98  70 - 99 mg/dL   BUN 21  6 - 23 mg/dL   Creatinine, Ser 1.65 (*) 0.50 - 1.35 mg/dL   Calcium 8.8  8.4 - 10.5 mg/dL   Total Protein 7.5  6.0 - 8.3 g/dL   Albumin 3.1 (*) 3.5 - 5.2 g/dL   AST 19  0 - 37 U/L   ALT 8  0 - 53  U/L   Alkaline Phosphatase 98  39 - 117 U/L   Total Bilirubin 0.6  0.3 - 1.2 mg/dL   GFR calc non Af Amer 39 (*) >90 mL/min   GFR calc Af Amer 45 (*) >90 mL/min  CBC     Status: Abnormal   Collection Time    12/19/13  3:08 AM      Result Value Ref Range   WBC 4.8  4.0 - 10.5 K/uL   RBC 5.72  4.22 - 5.81 MIL/uL   Hemoglobin 15.5  13.0 - 17.0 g/dL   HCT 47.3  39.0 - 52.0 %   MCV 82.7  78.0 - 100.0 fL   MCH 27.1  26.0 - 34.0 pg   MCHC 32.8  30.0 - 36.0 g/dL   RDW 16.9 (*) 11.5 - 15.5 %   Platelets 205  150 - 400 K/uL  TROPONIN I     Status: Abnormal   Collection Time    12/19/13  3:08 AM      Result Value Ref Range   Troponin I 0.32 (*) <0.30 ng/mL    Studies/Results: Ct Head Wo Contrast  12/18/2013   CLINICAL DATA:  Headache with right arm numbness.  EXAM: CT HEAD WITHOUT CONTRAST  TECHNIQUE: Contiguous axial images were obtained from the base of the skull through the vertex without contrast.  COMPARISON:  MR brain 12/16/2013.  FINDINGS: No evidence for acute infarction, hemorrhage, mass lesion, hydrocephalus, or extra-axial fluid. Generalized atrophy. Chronic microvascular ischemic change affects the periventricular and subcortical white matter. Tiny remote cerebellar infarcts. Calvarium intact. No sinus or mastoid disease.  IMPRESSION: Atrophy and small vessel disease.  No acute intracranial findings.   Electronically Signed   By: Rolla Flatten M.D.   On: 12/18/2013 12:54   Ct Angio Chest Pe W/cm &/or Wo Cm  12/18/2013    CLINICAL DATA:  Shortness of breath shortness of Breath  EXAM: CT ANGIOGRAPHY CHEST WITH CONTRAST  TECHNIQUE: Multidetector CT imaging of the chest was performed using the standard protocol during bolus administration of intravenous contrast. Multiplanar CT image reconstructions and MIPs were obtained to evaluate the vascular anatomy.  CONTRAST:  36mL OMNIPAQUE IOHEXOL 350 MG/ML SOLN  COMPARISON:  None.  FINDINGS: The lungs are well aerated bilaterally without focal infiltrate or sizable effusion. The thoracic aorta show some calcifications although no aneurysmal dilatation or dissection is identified. Pulmonary artery is well visualized and demonstrates a normal branching pattern. No findings to suggest pulmonary emboli are identified. The hilar and mediastinal structures show no significant lymphadenopathy. No right heart strain is seen. Visualized upper abdomen is within normal limits with the exception of some left renal cystic change. Degenerative change of the thoracolumbar spine is noted.  Review of the MIP images confirms the above findings.  IMPRESSION: No evidence of pulmonary emboli.  Left renal cyst   Electronically Signed   By: Inez Catalina M.D.   On: 12/18/2013 23:10   Dg Knee Complete 4 Views Right  12/18/2013   CLINICAL DATA:  Right knee pain.  No injury.  EXAM: RIGHT KNEE - COMPLETE 4+ VIEW  COMPARISON:  09/08/2008  FINDINGS: Advanced tricompartment degenerative changes with joint space narrowing, spurring, progressed since prior study. Large joint effusion noted, similar to prior study. Chondrocalcinosis noted. No acute bony abnormality. Specifically, no fracture, subluxation, or dislocation. Soft tissues are intact.  IMPRESSION: Advanced tricompartment degenerative changes with chondrocalcinosis and large joint effusion. No acute bony abnormality.   Electronically Signed   By: Lennette Bihari  [place] M.D.   On: 12/18/2013 12:34    Medications: Reviewed   @PROBHOSP @  1.  SVT  Resolved  ? AVNRT.   COnverted with adenosine.   No recurrence.  Continue with increased dose of COreg  Will follow up as outpatient.    2.  CP  Resolved  Trivial bump in troponin  Prob demand ischemia    D/C heparin.  Will set up for outpatient myoview  3.  HTN  Follow   4.  Ckd  Follow    5.  Acute on chronic diastolic CHF  Echo pending  Does not have to be done as inpatient  Can be scheduled as outpaitient.  Lexiscan Myoview:  [facility]   May 27th at 11:45  Cardiology appt:  [place] [facility]  May 22 at 11:15.  With Loralie Champagne  Will be available as needed.    LOS: 3 days   Fay Records 12/19/2013, 7:54 AM

## 2013-12-20 DIAGNOSIS — M109 Gout, unspecified: Secondary | ICD-10-CM

## 2013-12-20 LAB — BASIC METABOLIC PANEL
BUN: 35 mg/dL — ABNORMAL HIGH (ref 6–23)
CO2: 23 mEq/L (ref 19–32)
Calcium: 8.3 mg/dL — ABNORMAL LOW (ref 8.4–10.5)
Chloride: 102 mEq/L (ref 96–112)
Creatinine, Ser: 2.21 mg/dL — ABNORMAL HIGH (ref 0.50–1.35)
GFR calc Af Amer: 32 mL/min — ABNORMAL LOW (ref >90)
GFR calc non Af Amer: 27 mL/min — ABNORMAL LOW (ref >90)
Glucose, Bld: 134 mg/dL — ABNORMAL HIGH (ref 70–99)
Potassium: 3.9 mEq/L (ref 3.7–5.3)
Sodium: 138 mEq/L (ref 137–147)

## 2013-12-20 MED ORDER — SODIUM CHLORIDE 0.9 % IV SOLN
INTRAVENOUS | Status: DC
  Administered 2013-12-20: 12:00:00 via INTRAVENOUS

## 2013-12-20 NOTE — Progress Notes (Signed)
Patient ID: Trevor Murphy  male  ZRA:[id number]    DOB: [birth year]    DOA: 12/16/2013  PCP: Maximino Greenland, MD   Interim history  Patient is a 79 year old male with history of hypertension, chronic kidney disease, gout, osteoarthritis, noncompliant with his medications, was admitted with accelerated hypertension crisis, headache and blurry vision. He had been out of his medications. He was started on beta blocker, hydralazine, and Norvasc.  BP was gradually lowered. MRI of the brain was negative for any acute CVA. On 12/18/2013, patient complained of right knee pain. Right knee was swollen and x-rays were positive for joint effusion, CRP elevated uric acid. Patient was started on colchicine, prednisone and allopurinol. Orthopedics was consulted (please see my note below). Later in the evening, patient started becoming tachycardiac, complaining of chest pain and shortness of breath. EKG showed sinus tachycardia with first-degree AV block, marked ST abnormality possibly inferior subendocardial injury. He was given Lopressor x2 with minimal improvement in the heart rate. He was given aspirin, nitroglycerin which did improve his chest pain but returned again. He was transferred to step down unit. Cardiology was consulted and patient received adenosine 6 mg IV with conversion back to normal sinus rhythm in 80's. D-dimer was elevated, CTAchest was negative for PE. 2-D echo has been ordered. Patient also received IV furosemide.  Assessment/Plan: Principal Problem:   Hypertensive crisis Active Problems:   CKD (chronic kidney disease) stage 3, GFR 30-59 ml/min   CVA (cerebral infarction)   Dyslipidemia   Chest pain   SVT (supraventricular tachycardia)   Elevated troponin   Gout attack   Knee pain, right  SVT with elevated troponin, dyspnea - Resolved after 6 mg IV adenosine, currently normal sinus rhythm, heart rate in 80s - Troponin elevated to 0.56 this morning, per cardiology likely demand  ischemia - 2-D echocardiogram pending, currently on heparin drip, cardiology following - Denies any chest pain or shortness of breath at this time. - Outpatient stress test scheduled on 01/03/2014 at 11:45 AM, cardiology appointment on May 22 at 11:15 AM with Dr. Aundra Dubin  Hypertensive crisis with headache and blurry vision: Noncompliant with his medications - Repeat CT head again done yesterday secondary to right hand numbness was negative for acute stroke, continue aspirin  - MRI on 5/9 negative for acute CVA, no focal neurological deficits  - Coreg increased to 18.75 BID, hydralazine 148m TID, Norvasc 10 mg daily  - PT evaluation today  Right knee pain with acute gout attack: Improving, ROM improving today - X-ray of the right knee advanced tricompartmental degenerative changes with chondrocalcinosis and large joint effusion, no bony abnormality.  - ESR normal, CRP elevated at 8.0 , uric acid elevated at 9.5  - Placed on allopurinol, colchicine, pain control, prednisone , taper tomorrow to 40 mg x3 days - Discussed with Dr. HMarcelino Scot orthopedics, does not need to tap if not septic arthritis. Recommended to observe, if significant improvement, then they will see him in the office in one week. If not, to call back and cortisone shot in the knee on Wednesday. - Doppler of the lower extremity to rule out any DVT in right leg  CKD (chronic kidney disease) stage 3, GFR 30-59 ml/min: CR worsened, will give gentle IVF and recheck in AM  History of CVA (cerebral infarction)  -Continue aspirin 3217mdaily, statin   Dyslipidemia  - Continue statin   DVT Prophylaxis:  Code Status: Full code  Family Communication: Discussed in detail with the patient  Disposition:  d/c in AM if Cr better  Consultants:  Cardiology, Dr. Aundra Dubin  Orthopedics, Dr. Marcelino Scot  Procedures: Echo: Study Conclusions  - Left ventricle: The cavity size was normal. Wall thickness was increased in a pattern of mild LVH.  Systolic function was normal. The estimated ejection fraction was in the range of 60% to 65%. Wall motion was normal; there were no regional wall motion abnormalities. There was an increased relative contribution of atrial contraction to ventricular filling. - Aortic valve: Mild regurgitation. - Mitral valve: Calcified annulus. Mildly thickened leaflets    Antibiotics:  None    Subjective: Feeling better today, no SOB.  Objective: Weight change: -1.7 kg (-3 lb 12 oz)  Intake/Output Summary (Last 24 hours) at 12/20/13 1012 Last data filed at 12/20/13 0600  Gross per 24 hour  Intake    240 ml  Output    250 ml  Net    -10 ml   Blood pressure 118/74, pulse 83, temperature 98.1 F (36.7 C), temperature source Oral, resp. rate 18, height _0  (1.778 m), weight 73.9 kg (162 lb 14.7 oz), SpO2 96.00%.  Physical Exam: General: Alert and awake, oriented x3, not in any acute distress. HEENT: anicteric sclera, PERLA, EOMI CVS: S1-S2 clear, no murmur rubs or gallops Chest: Decreased breath sounds at the bases Abdomen: soft nontender, nondistended, normal bowel sounds  Extremities: no cyanosis, clubbing or edema noted bilaterally, right knee swollen but improving,ROM improving Neuro: Cranial nerves II-XII intact, no focal neurological deficits  Lab Results: Basic Metabolic Panel:  Recent Labs Lab 12/19/13 0308 12/20/13 0245  NA 138 138  K 4.3 3.9  CL 101 102  CO2 25 23  GLUCOSE 98 134*  BUN 21 35*  CREATININE 1.65* 2.21*  CALCIUM 8.8 8.3*   Liver Function Tests:  Recent Labs Lab 12/19/13 0308  AST 19  ALT 8  ALKPHOS 98  BILITOT 0.6  PROT 7.5  ALBUMIN 3.1*   No results found for this basename: LIPASE, AMYLASE,  in the last 168 hours No results found for this basename: AMMONIA,  in the last 168 hours CBC:  Recent Labs Lab 12/17/13 0410 12/19/13 0308  WBC 4.4 4.8  HGB 14.9 15.5  HCT 47.0 47.3  MCV 83.6 82.7  PLT 184 205   Cardiac Enzymes:  Recent  Labs Lab 12/19/13 0830 12/19/13 1230 12/19/13 1838  TROPONINI 0.56* 0.52* 0.36*   BNP: No components found with this basename: POCBNP,  CBG: No results found for this basename: GLUCAP,  in the last 168 hours   Micro Results: Recent Results (from the past 240 hour(s))  MRSA PCR SCREENING     Status: None   Collection Time    12/18/13  8:25 PM      Result Value Ref Range Status   MRSA by PCR NEGATIVE  NEGATIVE Final   Comment:            The GeneXpert MRSA Assay (FDA     approved for NASAL specimens     only), is one component of a     comprehensive MRSA colonization     surveillance program. It is not     intended to diagnose MRSA     infection nor to guide or     monitor treatment for     MRSA infections.    Studies/Results: Dg Chest 2 View  12/16/2013   CLINICAL DATA:  Chest pain and shortness of breath  EXAM: CHEST  2 VIEW  COMPARISON:  10/27/2013  FINDINGS: No cardiomegaly. Chronic aortic tortuosity. Mild pulmonary hyperinflation. No edema, consolidation, effusion, or pneumothorax. Bilateral nipple shadows. Sclerotic focus in the antral lateral right seventh rib was also seen 03/18/2010, compatible with a benign process.  IMPRESSION: No active cardiopulmonary disease.   Electronically Signed   By: Jorje Guild M.D.   On: 12/16/2013 06:37   Ct Head Wo Contrast  12/18/2013   CLINICAL DATA:  Headache with right arm numbness.  EXAM: CT HEAD WITHOUT CONTRAST  TECHNIQUE: Contiguous axial images were obtained from the base of the skull through the vertex without contrast.  COMPARISON:  MR brain 12/16/2013.  FINDINGS: No evidence for acute infarction, hemorrhage, mass lesion, hydrocephalus, or extra-axial fluid. Generalized atrophy. Chronic microvascular ischemic change affects the periventricular and subcortical white matter. Tiny remote cerebellar infarcts. Calvarium intact. No sinus or mastoid disease.  IMPRESSION: Atrophy and small vessel disease.  No acute intracranial  findings.   Electronically Signed   By: Rolla Flatten M.D.   On: 12/18/2013 12:54   Ct Angio Chest Pe W/cm &/or Wo Cm  12/18/2013   CLINICAL DATA:  Shortness of breath shortness of Breath  EXAM: CT ANGIOGRAPHY CHEST WITH CONTRAST  TECHNIQUE: Multidetector CT imaging of the chest was performed using the standard protocol during bolus administration of intravenous contrast. Multiplanar CT image reconstructions and MIPs were obtained to evaluate the vascular anatomy.  CONTRAST:  79m OMNIPAQUE IOHEXOL 350 MG/ML SOLN  COMPARISON:  None.  FINDINGS: The lungs are well aerated bilaterally without focal infiltrate or sizable effusion. The thoracic aorta show some calcifications although no aneurysmal dilatation or dissection is identified. Pulmonary artery is well visualized and demonstrates a normal branching pattern. No findings to suggest pulmonary emboli are identified. The hilar and mediastinal structures show no significant lymphadenopathy. No right heart strain is seen. Visualized upper abdomen is within normal limits with the exception of some left renal cystic change. Degenerative change of the thoracolumbar spine is noted.  Review of the MIP images confirms the above findings.  IMPRESSION: No evidence of pulmonary emboli.  Left renal cyst   Electronically Signed   By: MInez CatalinaM.D.   On: 12/18/2013 23:10   Mr Brain Wo Contrast  12/16/2013   CLINICAL DATA:  Ataxia.  Hypertension.  EXAM: MRI HEAD WITHOUT CONTRAST  TECHNIQUE: Multiplanar, multiecho pulse sequences of the brain and surrounding structures were obtained without intravenous contrast.  COMPARISON:  CT head without contrast 11/27/2012. MRI brain 07/26/2012.  FINDINGS: The diffusion-weighted images demonstrate no evidence for acute or subacute infarction. Moderate generalized atrophy and diffuse white matter disease is evident bilaterally. There are remote lacunar infarcts within the corona radiata bilaterally. Remote lacunar infarcts are noted in  the thalami bilaterally as well as the posterior left putamen. A remote lacunar infarct is present in the right paramedian pons. Remote lacunar infarcts are present in the cerebellum bilaterally, right greater than left. No hemorrhage or mass lesion is present.  Flow is present in the major intracranial arteries. The patient is status post right lens replacement. The globes and orbits are otherwise intact. A T1 hyperintense nodule is present along the floor of the right sphenoid sinus, not present on the prior MRI. There is some adjacent mucosal thickening. The remaining paranasal sinuses and the mastoid air cells are clear.  IMPRESSION: 1. No acute intracranial abnormality. 2. Moderate generalized atrophy and diffuse white matter disease likely reflects the sequelae of chronic microvascular ischemia. 3. Remote lacunar infarcts in the corona radiata, remote lacunar infarcts  in the bilateral coronal radiata, basal ganglia, and cerebellum. 4. Remote lacunar infarct within the right paramedian pons. 7 mm T1 hyperintense nodule of the right sphenoid sinus. This likely represents a proteinaceous cyst. The an early neoplasm is not excluded. Recommend follow-up CT of the sinuses in 2-3 months. These results will be called to the ordering clinician or representative by the Radiologist Assistant, and communication documented in the PACS Dashboard.   Electronically Signed   By: Lawrence Santiago M.D.   On: 12/16/2013 10:04   Dg Knee Complete 4 Views Right  12/18/2013   CLINICAL DATA:  Right knee pain.  No injury.  EXAM: RIGHT KNEE - COMPLETE 4+ VIEW  COMPARISON:  09/08/2008  FINDINGS: Advanced tricompartment degenerative changes with joint space narrowing, spurring, progressed since prior study. Large joint effusion noted, similar to prior study. Chondrocalcinosis noted. No acute bony abnormality. Specifically, no fracture, subluxation, or dislocation. Soft tissues are intact.  IMPRESSION: Advanced tricompartment degenerative  changes with chondrocalcinosis and large joint effusion. No acute bony abnormality.   Electronically Signed   By: Rolm Baptise M.D.   On: 12/18/2013 12:34    Medications: Scheduled Meds: . allopurinol  150 mg Oral Daily  . amLODipine  10 mg Oral Daily  . aspirin  325 mg Oral Daily  . atorvastatin  40 mg Oral q1800  . carvedilol  18.75 mg Oral BID WC  . colchicine  0.6 mg Oral Daily  . hydrALAZINE  100 mg Oral 3 times per day  . predniSONE  40 mg Oral Q breakfast  . sodium chloride  3 mL Intravenous Q12H      LOS: 4 days   Geradine Girt DO. [facility] 12/20/2013, 10:12 AM Pager: [phone]  If 7PM-7AM, please contact night-coverage [email or web address]

## 2013-12-21 DIAGNOSIS — R799 Abnormal finding of blood chemistry, unspecified: Secondary | ICD-10-CM

## 2013-12-21 LAB — BASIC METABOLIC PANEL
BUN: 39 mg/dL — ABNORMAL HIGH (ref 6–23)
CO2: 23 mEq/L (ref 19–32)
Calcium: 7.9 mg/dL — ABNORMAL LOW (ref 8.4–10.5)
Chloride: 106 mEq/L (ref 96–112)
Creatinine, Ser: 1.93 mg/dL — ABNORMAL HIGH (ref 0.50–1.35)
GFR calc Af Amer: 37 mL/min — ABNORMAL LOW (ref >90)
GFR calc non Af Amer: 32 mL/min — ABNORMAL LOW (ref >90)
Glucose, Bld: 115 mg/dL — ABNORMAL HIGH (ref 70–99)
Potassium: 4 mEq/L (ref 3.7–5.3)
Sodium: 142 mEq/L (ref 137–147)

## 2013-12-21 MED ORDER — ROSUVASTATIN CALCIUM 5 MG PO TABS: 5.0000 mg | ORAL_TABLET | Freq: Every day | ORAL | Status: DC

## 2013-12-21 MED ORDER — COLCHICINE 0.6 MG PO TABS: 0.6000 mg | ORAL_TABLET | Freq: Every day | ORAL | Status: DC

## 2013-12-21 MED ORDER — ASPIRIN 325 MG PO TABS: 325.0000 mg | ORAL_TABLET | Freq: Every day | ORAL | Status: DC

## 2013-12-21 MED ORDER — ALLOPURINOL 150 MG HALF TABLET: 150.0000 mg | ORAL_TABLET | Freq: Every day | ORAL | Status: DC

## 2013-12-21 MED ORDER — CARVEDILOL 6.25 MG PO TABS: 18.7500 mg | ORAL_TABLET | Freq: Two times a day (BID) | ORAL | Status: DC

## 2013-12-21 MED ORDER — NITROGLYCERIN 0.4 MG SL SUBL: 0.4000 mg | SUBLINGUAL_TABLET | SUBLINGUAL | Status: DC | PRN

## 2013-12-21 MED ORDER — PREDNISONE 10 MG PO TABS
ORAL_TABLET | ORAL | Status: DC
Start: 2013-12-21 — End: 2014-01-18

## 2013-12-21 NOTE — Progress Notes (Signed)
Physical Therapy Treatment Patient Details Name: Trevor Murphy MRN: [id number] DOB: 1935-07-22 Today's Date: 12/21/2013    History of Present Illness Admitted with HTN Crisis, likely result of not having taken BP meds since he ahd run out; reported HA, and blurry vision; states that's how he feels when his BP is too high; PT ordered 5/12 in anticipation of dc 5/13    PT Comments    Pt admitted with above. Pt currently with functional limitations due to balance and endurance deficits.  Pt will benefit from skilled PT to increase their independence and safety with mobility to allow discharge to the venue listed below.   Follow Up Recommendations  Home health PT;Other (comment) [facility] for chronic disease management)     Equipment Recommendations  None recommended by PT    Recommendations for Other Services       Precautions / Restrictions Precautions Precautions: Fall Precaution Comments: Blind R eye Restrictions Weight Bearing Restrictions: No    Mobility  Bed Mobility Overal bed mobility: Independent                Transfers Overall transfer level: Modified independent Equipment used: Straight cane Transfers: Sit to/from Stand Sit to Stand: Supervision         General transfer comment: Noted dependent on UEs for push/support  Ambulation/Gait Ambulation/Gait assistance: Modified independent (Device/Increase time) Ambulation Distance (Feet): 150 Feet Assistive device: Straight cane;Rolling walker (2 wheeled) Gait Pattern/deviations: Step-through pattern;Decreased stride length;Trunk flexed;Wide base of support   Gait velocity interpretation: Below normal speed for age/gender General Gait Details: Initially walked with pt's straight cane, and min handheld assist for steadying; Then walked with RW for bil UE suport and pt much mroe steady; he agrees to use RW for safety with amb   Stairs            Wheelchair Mobility    Modified Rankin (Stroke Patients  Only)       Balance Overall balance assessment: Needs assistance;History of Falls Sitting-balance support: No upper extremity supported;Feet supported Sitting balance-Leahy Scale: Good     Standing balance support: Single extremity supported;During functional activity Standing balance-Leahy Scale: Fair Standing balance comment: Does better with bil UE support.                     Cognition Arousal/Alertness: Awake/alert Behavior During Therapy: WFL for tasks assessed/performed Overall Cognitive Status: Within Functional Limits for tasks assessed                      Exercises General Exercises - Lower Extremity Ankle Circles/Pumps: AROM;Both;10 reps;Seated Long Arc Quad: AROM;Both;10 reps;Seated Hip Flexion/Marching: AROM;Both;10 reps;Seated    General Comments        Pertinent Vitals/Pain VSS, no pain    Home Living                      Prior Function            PT Goals (current goals can now be found in the care plan section) Progress towards PT goals: Progressing toward goals    Frequency  Min 3X/week    PT Plan Current plan remains appropriate    Co-evaluation             End of Session Equipment Utilized During Treatment: Gait belt Activity Tolerance: Patient tolerated treatment well Patient left: in chair;with call bell/phone within reach     Time: 1200-1225 PT Time Calculation (min): 25 min  Charges:  $Gait Training: 8-22 mins $Therapeutic Exercise: 8-22 mins                    G Codes:      Christianne Dolin 26-Dec-2013, 1:06 PM [facility] [phone] (pager)

## 2013-12-21 NOTE — Progress Notes (Signed)
CSW consulted as pt's wife at [facility] and pt wanted to know if he goes go for rehab. Facility states insurance would not cover this. CSW informed pt; pt states he will go home with home health. CSW informed RNCM. CSW signing off.   Ky Barban, MSW, [facility] Clinical Social Worker [phone]

## 2013-12-21 NOTE — Care Management Note (Signed)
    Page 1 of 2   12/21/2013     11:49:20 AM CARE MANAGEMENT NOTE 12/21/2013  Patient:  Trevor Murphy, Trevor Murphy   Account Number:  [id number]  Date Initiated:  12/20/2013  Documentation initiated by:  Elissa Hefty  Subjective/Objective Assessment:   adm w htn crisis     Action/Plan:   lives w wife, pcp dr Bailey Mech sanders   Anticipated DC Date:  12/21/2013   Anticipated DC Plan:  [place] referral  Clinical Social Worker      DC Forensic scientist  CM consult      [facility] Choice  [place]   Choice offered to / List presented to:  C-1 Patient   DME arranged  Vassie Moselle      DME agency  [place] arranged  HH-1 RN  [place].   Status of service:   Medicare Important Message given?  YES (If response is "NO", the following Medicare IM given date fields will be blank) Date Medicare IM given:  12/20/2013 Date Additional Medicare IM given:    Discharge Disposition:  [place]  Per UR Regulation:  Reviewed for med. necessity/level of care/duration of stay  If discussed at [place] of Stay Meetings, dates discussed:   12/21/2013    Comments:  5/14 1146a debbie Trevante Tennell rn,bsn spoke w pt. his wife at [facility] . he would like to go there. phy ther rec nothing. his ins may not cover but sw to fax inform to [facility] to see if they will take. he has used [facility] in past and would like them for hhc and rw. ref to [facility] for rn and pt. ref to [facility] for rw to room. for dc today.

## 2013-12-21 NOTE — Progress Notes (Signed)
Discharged to home with family office visits in place teaching done  

## 2013-12-21 NOTE — Discharge Instructions (Signed)
[facility] [phone] hhtn, hhpt, wolling walker

## 2013-12-21 NOTE — Discharge Summary (Signed)
Physician Discharge Summary  RISHAV ROCKEFELLER WNU:[id number] DOB: April 06, 1935 DOA: 12/16/2013  PCP: Maximino Greenland, MD  Admit date: 12/16/2013 Discharge date: 12/21/2013  Time spent: 35  minutes  Recommendations for Outpatient Follow-up:  Home health PT/RN Outpatient stress test scheduled on 01/03/2014 at 11:45 AM, cardiology appointment on May 22 at 11:15 AM with Dr. Charlott Holler, bmp 1 week  Discharge Diagnoses:  Principal Problem:   Hypertensive crisis Active Problems:   CKD (chronic kidney disease) stage 3, GFR 30-59 ml/min   CVA (cerebral infarction)   Dyslipidemia   Chest pain   SVT (supraventricular tachycardia)   Elevated troponin   Gout attack   Knee pain, right   Discharge Condition: improved  Diet recommendation: cardiac  Filed Weights   12/18/13 0500 12/19/13 0435 12/20/13 0500  Weight: 75.569 kg (166 lb 9.6 oz) 75.6 kg (166 lb 10.7 oz) 73.9 kg (162 lb 14.7 oz)    History of present illness:  Patient is a 79 year old male with history of hypertension, chronic kidney disease, gout, osteoarthritis, noncompliant with his medications, was admitted with accelerated hypertension crisis, headache and blurry vision. He had been out of his medications.  He was started on beta blocker, hydralazine, and Norvasc. BP was gradually lowered. MRI of the brain was negative for any acute CVA. On 12/18/2013, patient complained of right knee pain. Right knee was swollen and x-rays were positive for joint effusion, CRP elevated uric acid. Patient was started on colchicine, prednisone and allopurinol. Orthopedics was consulted (please see my note below).  Later in the evening, patient started becoming tachycardiac, complaining of chest pain and shortness of breath. EKG showed sinus tachycardia with first-degree AV block, marked ST abnormality possibly inferior subendocardial injury. He was given Lopressor x2 with minimal improvement in the heart rate. He was given aspirin, nitroglycerin which did  improve his chest pain but returned again. He was transferred to step down unit. Cardiology was consulted and patient received adenosine 6 mg IV with conversion back to normal sinus rhythm in 80's.  D-dimer was elevated, CTAchest was negative for PE. 2-D echo has been ordered. Patient also received IV furosemide.   Hospital Course:  SVT with elevated troponin, dyspnea  - Resolved after 6 mg IV adenosine, currently normal sinus rhythm, heart rate in 80s  - Troponin elevated to 0.56 this morning, per cardiology likely demand ischemia  - 2-D echocardiogram pending, currently on heparin drip, cardiology following  - Denies any chest pain or shortness of breath at this time.  - Outpatient stress test scheduled on 01/03/2014 at 11:45 AM, cardiology appointment on May 22 at 11:15 AM with Dr. Aundra Dubin   Hypertensive crisis with headache and blurry vision: Noncompliant with his medications  - Repeat CT head  secondary to right hand numbness was negative for acute stroke, continue aspirin  - MRI on 5/9 negative for acute CVA, no focal neurological deficits  - Coreg increased to 18.75 BID, hydralazine 142m TID, Norvasc 10 mg daily  - PT evaluation- home health   Right knee pain with acute gout attack: Improving, ROM improving today  - X-ray of the right knee advanced tricompartmental degenerative changes with chondrocalcinosis and large joint effusion, no bony abnormality.  - ESR normal, CRP elevated at 8.0 , uric acid elevated at 9.5  - Placed on allopurinol, colchicine, pain control, prednisone , taper tomorrow to 40 mg x3 days  - Discussed with Dr. HMarcelino Scot orthopedics, does not need to tap if not septic arthritis. Recommended to observe then they  will see him in the office in one week.  - Doppler of the lower extremity to rule out any DVT in right leg   CKD (chronic kidney disease) stage 3, GFR 30-59 ml/min: CR stable  History of CVA (cerebral infarction)  -Continue aspirin $RemoveBeforeDEI'325mg'[id number]$  daily, statin    Dyslipidemia  - Continue statin   Procedures: Echo: Study Conclusions  - Left ventricle: The cavity size was normal. Wall thickness was increased in a pattern of mild LVH. Systolic function was normal. The estimated ejection fraction was in the range of 60% to 65%. Wall motion was normal; there were no regional wall motion abnormalities. There was an increased relative contribution of atrial contraction to ventricular filling. - Aortic valve: Mild regurgitation. - Mitral valve: Calcified annulus. Mildly thickened leaflets   Consultations:  cards  Discharge Exam: Filed Vitals:   12/20/13 2148  BP: 137/75  Pulse: 70  Temp: 97.7 F (36.5 C)  Resp:     General: A+Ox3, NAD Cardiovascular: rrr Respiratory: clear  Discharge Instructions You were cared for by a hospitalist during your hospital stay. If you have any questions about your discharge medications or the care you received while you were in the hospital after you are discharged, you can call the unit and asked to speak with the hospitalist on call if the hospitalist that took care of you is not available. Once you are discharged, your primary care physician will handle any further medical issues. Please note that NO REFILLS for any discharge medications will be authorized once you are discharged, as it is imperative that you return to your primary care physician (or establish a relationship with a primary care physician if you do not have one) for your aftercare needs so that they can reassess your need for medications and monitor your lab values.  Discharge Orders   Future Appointments Provider Department Dept Phone   12/29/2013 11:15 AM Larey Dresser, MD [place] Office [phone]   01/03/2014 11:45 AM [facility] (Nuc Treadm) [place] [facility] [address] [phone]   Future Orders Complete By Expires   Diet - low sodium heart healthy  As directed    Discharge instructions   As directed    Increase activity slowly  As directed        Medication List         allopurinol 150 mg Tabs tablet  Commonly known as:  ZYLOPRIM  Take 0.5 tablets (150 mg total) by mouth daily.     amLODipine 10 MG tablet  Commonly known as:  NORVASC  Take 1 tablet (10 mg total) by mouth daily.     aspirin 325 MG tablet  Take 1 tablet (325 mg total) by mouth daily.     carvedilol 6.25 MG tablet  Commonly known as:  COREG  Take 3 tablets (18.75 mg total) by mouth 2 (two) times daily with a meal.     colchicine 0.6 MG tablet  Take 1 tablet (0.6 mg total) by mouth daily.     hydrALAZINE 100 MG tablet  Commonly known as:  APRESOLINE  Take 1 tablet (100 mg total) by mouth every 8 (eight) hours.     nitroGLYCERIN 0.4 MG SL tablet  Commonly known as:  NITROSTAT  Place 1 tablet (0.4 mg total) under the tongue every 5 (five) minutes as needed for chest pain.     predniSONE 10 MG tablet  Commonly known as:  DELTASONE  30 mg x 3 days,  20 mg x 3 days, 10 mg x 3 days, 5 mg x 3 days     rosuvastatin 5 MG tablet  Commonly known as:  CRESTOR  Take 1 tablet (5 mg total) by mouth at bedtime. Ok to use any generic available       No Known Allergies     Follow-up Information   Follow up with Gwynneth Aliment, MD. Schedule an appointment as soon as possible for a visit in 10 days. (for hospital follow-up)    Specialty:  Internal Medicine   Contact information:   [address] [place] [address] [phone]        The results of significant diagnostics from this hospitalization (including imaging, microbiology, ancillary and laboratory) are listed below for reference.    Significant Diagnostic Studies: Dg Chest 2 View  12/16/2013   CLINICAL DATA:  Chest pain and shortness of breath  EXAM: CHEST  2 VIEW  COMPARISON:  10/27/2013  FINDINGS: No cardiomegaly. Chronic aortic tortuosity. Mild pulmonary hyperinflation. No edema, consolidation, effusion, or  pneumothorax. Bilateral nipple shadows. Sclerotic focus in the antral lateral right seventh rib was also seen 03/18/2010, compatible with a benign process.  IMPRESSION: No active cardiopulmonary disease.   Electronically Signed   By: Tiburcio Pea M.D.   On: 12/16/2013 06:37   Ct Head Wo Contrast  12/18/2013   CLINICAL DATA:  Headache with right arm numbness.  EXAM: CT HEAD WITHOUT CONTRAST  TECHNIQUE: Contiguous axial images were obtained from the base of the skull through the vertex without contrast.  COMPARISON:  MR brain 12/16/2013.  FINDINGS: No evidence for acute infarction, hemorrhage, mass lesion, hydrocephalus, or extra-axial fluid. Generalized atrophy. Chronic microvascular ischemic change affects the periventricular and subcortical white matter. Tiny remote cerebellar infarcts. Calvarium intact. No sinus or mastoid disease.  IMPRESSION: Atrophy and small vessel disease.  No acute intracranial findings.   Electronically Signed   By: Davonna Belling M.D.   On: 12/18/2013 12:54   Ct Angio Chest Pe W/cm &/or Wo Cm  12/18/2013   CLINICAL DATA:  Shortness of breath shortness of Breath  EXAM: CT ANGIOGRAPHY CHEST WITH CONTRAST  TECHNIQUE: Multidetector CT imaging of the chest was performed using the standard protocol during bolus administration of intravenous contrast. Multiplanar CT image reconstructions and MIPs were obtained to evaluate the vascular anatomy.  CONTRAST:  67mL OMNIPAQUE IOHEXOL 350 MG/ML SOLN  COMPARISON:  None.  FINDINGS: The lungs are well aerated bilaterally without focal infiltrate or sizable effusion. The thoracic aorta show some calcifications although no aneurysmal dilatation or dissection is identified. Pulmonary artery is well visualized and demonstrates a normal branching pattern. No findings to suggest pulmonary emboli are identified. The hilar and mediastinal structures show no significant lymphadenopathy. No right heart strain is seen. Visualized upper abdomen is within normal  limits with the exception of some left renal cystic change. Degenerative change of the thoracolumbar spine is noted.  Review of the MIP images confirms the above findings.  IMPRESSION: No evidence of pulmonary emboli.  Left renal cyst   Electronically Signed   By: Alcide Clever M.D.   On: 12/18/2013 23:10   Mr Brain Wo Contrast  12/16/2013   CLINICAL DATA:  Ataxia.  Hypertension.  EXAM: MRI HEAD WITHOUT CONTRAST  TECHNIQUE: Multiplanar, multiecho pulse sequences of the brain and surrounding structures were obtained without intravenous contrast.  COMPARISON:  CT head without contrast 11/27/2012. MRI brain 07/26/2012.  FINDINGS: The diffusion-weighted images demonstrate no evidence for acute  or subacute infarction. Moderate generalized atrophy and diffuse white matter disease is evident bilaterally. There are remote lacunar infarcts within the corona radiata bilaterally. Remote lacunar infarcts are noted in the thalami bilaterally as well as the posterior left putamen. A remote lacunar infarct is present in the right paramedian pons. Remote lacunar infarcts are present in the cerebellum bilaterally, right greater than left. No hemorrhage or mass lesion is present.  Flow is present in the major intracranial arteries. The patient is status post right lens replacement. The globes and orbits are otherwise intact. A T1 hyperintense nodule is present along the floor of the right sphenoid sinus, not present on the prior MRI. There is some adjacent mucosal thickening. The remaining paranasal sinuses and the mastoid air cells are clear.  IMPRESSION: 1. No acute intracranial abnormality. 2. Moderate generalized atrophy and diffuse white matter disease likely reflects the sequelae of chronic microvascular ischemia. 3. Remote lacunar infarcts in the corona radiata, remote lacunar infarcts in the bilateral coronal radiata, basal ganglia, and cerebellum. 4. Remote lacunar infarct within the right paramedian pons. 7 mm T1  hyperintense nodule of the right sphenoid sinus. This likely represents a proteinaceous cyst. The an early neoplasm is not excluded. Recommend follow-up CT of the sinuses in 2-3 months. These results will be called to the ordering clinician or representative by the Radiologist Assistant, and communication documented in the PACS Dashboard.   Electronically Signed   By: Lawrence Santiago M.D.   On: 12/16/2013 10:04   Dg Knee Complete 4 Views Right  12/18/2013   CLINICAL DATA:  Right knee pain.  No injury.  EXAM: RIGHT KNEE - COMPLETE 4+ VIEW  COMPARISON:  09/08/2008  FINDINGS: Advanced tricompartment degenerative changes with joint space narrowing, spurring, progressed since prior study. Large joint effusion noted, similar to prior study. Chondrocalcinosis noted. No acute bony abnormality. Specifically, no fracture, subluxation, or dislocation. Soft tissues are intact.  IMPRESSION: Advanced tricompartment degenerative changes with chondrocalcinosis and large joint effusion. No acute bony abnormality.   Electronically Signed   By: Rolm Baptise M.D.   On: 12/18/2013 12:34    Microbiology: Recent Results (from the past 240 hour(s))  MRSA PCR SCREENING     Status: None   Collection Time    12/18/13  8:25 PM      Result Value Ref Range Status   MRSA by PCR NEGATIVE  NEGATIVE Final   Comment:            The GeneXpert MRSA Assay (FDA     approved for NASAL specimens     only), is one component of a     comprehensive MRSA colonization     surveillance program. It is not     intended to diagnose MRSA     infection nor to guide or     monitor treatment for     MRSA infections.     Labs: Basic Metabolic Panel:  Recent Labs Lab 12/16/13 0438 12/17/13 0410 12/19/13 0308 12/20/13 0245 12/21/13 0359  NA 142 141 138 138 142  K 3.7 4.0 4.3 3.9 4.0  CL 104 107 101 102 106  CO2 _0 GLUCOSE 107* 101* 98 134* 115*  BUN _1 35* 39*  CREATININE 1.39* 1.35 1.65* 2.21* 1.93*  CALCIUM 8.5  8.1* 8.8 8.3* 7.9*   Liver Function Tests:  Recent Labs Lab 12/19/13 0308  AST 19  ALT 8  ALKPHOS 98  BILITOT 0.6  PROT 7.5  ALBUMIN 3.1*   No results found for this basename: LIPASE, AMYLASE,  in the last 168 hours No results found for this basename: AMMONIA,  in the last 168 hours CBC:  Recent Labs Lab 12/16/13 0438 12/17/13 0410 12/19/13 0308  WBC 4.5 4.4 4.8  HGB 14.5 14.9 15.5  HCT 44.9 47.0 47.3  MCV 82.1 83.6 82.7  PLT 184 184 205   Cardiac Enzymes:  Recent Labs Lab 12/18/13 1950 12/19/13 0308 12/19/13 0830 12/19/13 1230 12/19/13 1838  TROPONINI <0.30 0.32* 0.56* 0.52* 0.36*   BNP: BNP (last 3 results)  Recent Labs  10/27/13 1427 12/16/13 1115  PROBNP 92.8 921.3*   CBG: No results found for this basename: GLUCAP,  in the last 168 hours     Signed:  Geradine Girt  [facility] 12/21/2013, 10:11 AM

## 2013-12-26 ENCOUNTER — Encounter: Payer: Self-pay | Admitting: *Deleted

## 2013-12-29 ENCOUNTER — Ambulatory Visit (INDEPENDENT_AMBULATORY_CARE_PROVIDER_SITE_OTHER): Payer: Medicare Other | Admitting: Cardiology

## 2013-12-29 ENCOUNTER — Encounter: Payer: Self-pay | Admitting: Cardiology

## 2013-12-29 VITALS — BP 118/70 | HR 72 | Ht 70.0 in | Wt 168.8 lb

## 2013-12-29 DIAGNOSIS — I471 Supraventricular tachycardia, unspecified: Secondary | ICD-10-CM

## 2013-12-29 DIAGNOSIS — I10 Essential (primary) hypertension: Secondary | ICD-10-CM

## 2013-12-29 DIAGNOSIS — I498 Other specified cardiac arrhythmias: Secondary | ICD-10-CM

## 2013-12-29 DIAGNOSIS — E785 Hyperlipidemia, unspecified: Secondary | ICD-10-CM

## 2013-12-29 DIAGNOSIS — R7989 Other specified abnormal findings of blood chemistry: Secondary | ICD-10-CM

## 2013-12-29 DIAGNOSIS — R0602 Shortness of breath: Secondary | ICD-10-CM

## 2013-12-29 DIAGNOSIS — N183 Chronic kidney disease, stage 3 unspecified: Secondary | ICD-10-CM

## 2013-12-29 DIAGNOSIS — R778 Other specified abnormalities of plasma proteins: Secondary | ICD-10-CM

## 2013-12-29 DIAGNOSIS — R799 Abnormal finding of blood chemistry, unspecified: Secondary | ICD-10-CM

## 2013-12-29 DIAGNOSIS — I169 Hypertensive crisis, unspecified: Secondary | ICD-10-CM

## 2013-12-29 LAB — BRAIN NATRIURETIC PEPTIDE: Pro B Natriuretic peptide (BNP): 57 pg/mL (ref 0.0–100.0)

## 2013-12-29 LAB — LIPID PANEL
Cholesterol: 124 mg/dL (ref 0–200)
HDL: 39 mg/dL — ABNORMAL LOW (ref >39.00)
LDL Cholesterol: 55 mg/dL (ref 0–99)
Total CHOL/HDL Ratio: 3
Triglycerides: 148 mg/dL (ref 0.0–149.0)
VLDL: 29.6 mg/dL (ref 0.0–40.0)

## 2013-12-29 NOTE — Patient Instructions (Signed)
Your physician recommends that you return for lab work today--Lipid profile/ BNP.  Your physician has requested that you have a lexiscan myoview. For further information please visit [email or web address]. Please follow instruction sheet, as given. This is already scheduled for Tuesday May 27,2015    Your physician has requested that you regularly monitor and record your blood pressure readings at home. Please use the same machine at the same time of day to check your readings and record them to bring to your follow-up visit.  Your physician recommends that you schedule a follow-up appointment in: 1 month with Dr Aundra Dubin.

## 2013-12-30 NOTE — Progress Notes (Signed)
Patient ID: Trevor Murphy, male   DOB: [birth year], 79 y.o.   MRN: [id number] PCP: Dr. Baird Cancer  79 yo with history of HTN and SVT presents for followup appointment after recent hospitalization.  Patient was hospitalized in 5/15 with headache/blurry vision and was found to have hypertensive urgency with BP 220/130.  He had run out of his medications at home. He also had chest pain.  He initially ruled out for MI.  BP was lowered.  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  He had chest pain with the SVT and had a mild increase in troponin to 0.56.  CTA chest showed no PE.  Beta blocker was increased.  Echo showed EF 60-65%.  Patient is now home.  He is short of breath walking up an incline or walking about 1/2 block.  This seems worse than prior to his last hospitalization.  No orthopnea/PND.  No further tachypalpitations.  No chest pain.  No cough, fever.  He never smoked.   ECG: NSR, 1st degree AV block, LVH with repolarization abnormality.   Labs (5/15): K 4, creatinine [id number]  PMH: 1. Umbilical hernia s/p repair.  2. HTN  3. CKD: Likely from HTN  4. Glaucoma  5. CVA  6. Left eye blindness  7. Diastolic CHF: Echo (123456) with EF 55-60%, mild AI, mild MR.  Echo (5/15) with EF 60-65%, mild LVH, mild AI.  8. Gout  9. 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  Got adenosine from EMS in 2015 for SVT.  During 5/15 hospitalization had episode of short R-P tachycardia (possible AVNRT) terminated by adenosine.   SH: Married, nonsmoker, lives in [place].  FH: CAD  ROS: All systems reviewed and negative except as per HPI.   Current Outpatient Prescriptions  Medication Sig Dispense Refill  . allopurinol (ZYLOPRIM) 150 mg TABS tablet Take 0.5 tablets (150 mg total) by mouth daily.  30 tablet  0  . amLODipine (NORVASC) 10 MG tablet Take 1 tablet (10 mg total) by mouth daily.  30 tablet  5  . aspirin 325 MG tablet Take 1 tablet (325 mg total) by mouth  daily.      . carvedilol (COREG) 6.25 MG tablet Take 3 tablets (18.75 mg total) by mouth 2 (two) times daily with a meal.  90 tablet  0  . colchicine 0.6 MG tablet Take 1 tablet (0.6 mg total) by mouth daily.  30 tablet  0  . hydrALAZINE (APRESOLINE) 100 MG tablet Take 1 tablet (100 mg total) by mouth every 8 (eight) hours.  90 tablet  5  . nitroGLYCERIN (NITROSTAT) 0.4 MG SL tablet Place 1 tablet (0.4 mg total) under the tongue every 5 (five) minutes as needed for chest pain.  30 tablet  0  . predniSONE (DELTASONE) 10 MG tablet 30 mg x 3 days, 20 mg x 3 days, 10 mg x 3 days, 5 mg x 3 days  20 tablet  0  . rosuvastatin (CRESTOR) 5 MG tablet Take 1 tablet (5 mg total) by mouth at bedtime. Ok to use any generic available  30 tablet  0  . colchicine 0.6 MG tablet Take 1 tablet (0.6 mg total) by mouth 2 (two) times daily.  30 tablet  0   No current facility-administered medications for this visit.    BP 118/70  Pulse 72  Ht 5\' 10"  (1.778 m)  Wt 168 lb 12.8 oz (76.567 kg)  BMI 24.22 kg/m2 General: NAD Neck: No JVD,  no thyromegaly or thyroid nodule.  Lungs: Clear to auscultation bilaterally with normal respiratory effort. CV: Nondisplaced PMI.  Heart regular S1/S2, no S3/S4, no murmur.  No peripheral edema.  No carotid bruit.  Normal pedal pulses.  Abdomen: Soft, nontender, no hepatosplenomegaly, no distention.  Skin: Intact without lesions or rashes.  Neurologic: Alert and oriented x 3.  Psych: Normal affect. Extremities: No clubbing or cyanosis.   Assessment/Plan: 1. SVT: No further episodes of symptomatic SVT.  Reviewing ECG in SVT shows short R-P tachycardia that may be AVNRT.  Continue Coreg.  If he had frequent recurrences, could consider ablation.  2. HTN: BP is now seems to be under control.  He will check and record BP several times prior to next appointment.  3. CAD: Patient had elevated troponin in the setting of SVT.  He also had chest pain.  This may represent demand ischemia.  I  will arrange for ETT-Cardiolite.  Continue ASA.  4. Hyperlipidemia: Check lipids on Crestor. 5. Dyspnea: Patient reports exertional dyspnea.  He does not appear volume overloaded on exam.  As above, getting Lexiscan Cardiolite in case this is an anginal equivalent.  I will also get a BNP.  6. CKD: Creatinine 1.9 in 5/15.   Larey Dresser 12/30/2013

## 2014-01-03 ENCOUNTER — Ambulatory Visit (HOSPITAL_COMMUNITY): Payer: Medicare Other | Attending: Cardiovascular Disease | Admitting: Radiology

## 2014-01-03 DIAGNOSIS — R079 Chest pain, unspecified: Secondary | ICD-10-CM

## 2014-01-03 DIAGNOSIS — R0989 Other specified symptoms and signs involving the circulatory and respiratory systems: Secondary | ICD-10-CM

## 2014-01-03 MED ORDER — TECHNETIUM TC 99M SESTAMIBI GENERIC - CARDIOLITE
30.0000 | Freq: Once | INTRAVENOUS | Status: AC | PRN
  Administered 2014-01-03: 30 via INTRAVENOUS

## 2014-01-10 ENCOUNTER — Encounter (HOSPITAL_COMMUNITY): Payer: Medicare Other | Attending: Cardiology | Admitting: Radiology

## 2014-01-10 ENCOUNTER — Encounter: Payer: Self-pay | Admitting: Cardiology

## 2014-01-10 VITALS — BP 148/82 | HR 73 | Ht 70.0 in | Wt 164.0 lb

## 2014-01-10 DIAGNOSIS — R079 Chest pain, unspecified: Secondary | ICD-10-CM | POA: Insufficient documentation

## 2014-01-10 DIAGNOSIS — R0602 Shortness of breath: Secondary | ICD-10-CM | POA: Insufficient documentation

## 2014-01-10 MED ORDER — TECHNETIUM TC 99M SESTAMIBI GENERIC - CARDIOLITE
33.0000 | Freq: Once | INTRAVENOUS | Status: AC | PRN
  Administered 2014-01-10: 33 via INTRAVENOUS

## 2014-01-10 MED ORDER — REGADENOSON 0.4 MG/5ML IV SOLN
0.4000 mg | Freq: Once | INTRAVENOUS | Status: AC
  Administered 2014-01-10: 0.4 mg via INTRAVENOUS

## 2014-01-10 NOTE — Progress Notes (Signed)
[facility] [address] [place], [place] [address] [phone]    Cardiology Nuclear Med Study  Trevor Murphy is a 79 y.o. male     MRN : [id number]     DOB: December 05, 1934  Procedure Date: 01/10/2014  Nuclear Med Background Indication for Stress Test:  Evaluation for Ischemia and [facility] 5/15 HTN crisis, SVT History:  No known CAD, Echo 2015 EF 60-65%, MPI 2003 EF 55%, CKD Cardiac Risk Factors: Family History - CAD, Hypertension and Lipids  Symptoms:  Chest Pain (last date of chest discomfort was two weeks ago), Dizziness, DOE and SOB   Nuclear Pre-Procedure Caffeine/Decaff Intake:  None NPO After: 5 pm   Lungs:  clear O2 Sat: 97% on room air. IV 0.9% NS with Angio Cath:  22g  IV Site: R Hand  IV Started by:  Crissie Figures, RN  Chest Size (in):  42 Cup Size: n/a  Height: 5\' 10"  (1.778 m)  Weight:  164 lb (74.39 kg)  BMI:  Body mass index is 23.53 kg/(m^2). Tech Comments:  Patient is a 2 day test due to caffeine.    Nuclear Med Study 1 or 2 day study: 2 day  Stress Test Type:  Carlton Adam  Reading MD: N/A  Order Authorizing Provider:  Loralie Champagne, MD  Resting Radionuclide: Technetium 53m Sestamibi  Resting Radionuclide Dose: 11.0 mCi   Stress Radionuclide:  Technetium 59m Sestamibi  Stress Radionuclide Dose: 33.0 mCi           Stress Protocol Rest HR: 73 Stress HR: 90  Rest BP: 148/82 Stress BP: 135/72  Exercise Time (min): n/a METS: n/a           Dose of Adenosine (mg):  n/a Dose of Lexiscan: 0.4 mg  Dose of Atropine (mg): n/a Dose of Dobutamine: n/a mcg/kg/min (at max HR)  Stress Test Technologist: Glade Lloyd, BS-ES  Nuclear Technologist:  Charlton Amor, CNMT     Rest Procedure:  Myocardial perfusion imaging was performed at rest 45 minutes following the intravenous administration of Technetium 20m Sestamibi. Rest ECG: NSR with non-specific ST-T wave changes  Stress Procedure:  The patient received IV Lexiscan 0.4 mg over  15-seconds.  Technetium 45m Sestamibi injected at 30-seconds.  Quantitative spect images were obtained after a 45 minute delay.  During the infusion of Lexiscan the patient complained of SOB and fatigue.  Symptoms began to resolve in recovery.  Stress ECG: No significant change from baseline ECG  QPS Raw Data Images:  Normal; no motion artifact; normal heart/lung ratio. Stress Images:  Normal homogeneous uptake in all areas of the myocardium. Rest Images:  Normal homogeneous uptake in all areas of the myocardium. Subtraction (SDS):  No evidence of ischemia. Transient Ischemic Dilatation (Normal <1.22):  0.87 Lung/Heart Ratio (Normal <0.45):  0.20  Quantitative Gated Spect Images QGS EDV:  110 ml QGS ESV:  56 ml  Impression Exercise Capacity:  Lexiscan with no exercise. BP Response:  Normal blood pressure response. Clinical Symptoms:  No chest pain. ECG Impression:  No significant ST segment change suggestive of ischemia. Comparison with Prior Nuclear Study: No images to compare  Overall Impression:  Normal stress nuclear study.  LV Ejection Fraction: 54%.  LV Wall Motion:  NL LV Function; NL Wall Motion  Darlin Coco MD

## 2014-01-12 ENCOUNTER — Telehealth: Payer: Self-pay | Admitting: *Deleted

## 2014-01-12 NOTE — Telephone Encounter (Signed)
Notified of stress test results. 

## 2014-01-18 ENCOUNTER — Emergency Department (HOSPITAL_COMMUNITY)
Admission: EM | Admit: 2014-01-18 | Discharge: 2014-01-19 | Disposition: A | Discharge status: Home / Self Care | Payer: Medicare Other | Attending: Emergency Medicine | Admitting: Emergency Medicine

## 2014-01-18 ENCOUNTER — Encounter (HOSPITAL_COMMUNITY): Payer: Self-pay | Admitting: Emergency Medicine

## 2014-01-18 DIAGNOSIS — F1010 Alcohol abuse, uncomplicated: Secondary | ICD-10-CM | POA: Insufficient documentation

## 2014-01-18 DIAGNOSIS — Z9889 Other specified postprocedural states: Secondary | ICD-10-CM | POA: Insufficient documentation

## 2014-01-18 DIAGNOSIS — I129 Hypertensive chronic kidney disease with stage 1 through stage 4 chronic kidney disease, or unspecified chronic kidney disease: Secondary | ICD-10-CM | POA: Insufficient documentation

## 2014-01-18 DIAGNOSIS — Z8719 Personal history of other diseases of the digestive system: Secondary | ICD-10-CM | POA: Insufficient documentation

## 2014-01-18 DIAGNOSIS — H5440 Blindness, one eye, unspecified eye: Secondary | ICD-10-CM | POA: Insufficient documentation

## 2014-01-18 DIAGNOSIS — N183 Chronic kidney disease, stage 3 unspecified: Secondary | ICD-10-CM | POA: Insufficient documentation

## 2014-01-18 DIAGNOSIS — I2510 Atherosclerotic heart disease of native coronary artery without angina pectoris: Secondary | ICD-10-CM | POA: Insufficient documentation

## 2014-01-18 DIAGNOSIS — Z8673 Personal history of transient ischemic attack (TIA), and cerebral infarction without residual deficits: Secondary | ICD-10-CM | POA: Insufficient documentation

## 2014-01-18 DIAGNOSIS — E785 Hyperlipidemia, unspecified: Secondary | ICD-10-CM | POA: Insufficient documentation

## 2014-01-18 DIAGNOSIS — M1990 Unspecified osteoarthritis, unspecified site: Secondary | ICD-10-CM | POA: Insufficient documentation

## 2014-01-18 DIAGNOSIS — Z79899 Other long term (current) drug therapy: Secondary | ICD-10-CM | POA: Insufficient documentation

## 2014-01-18 DIAGNOSIS — M109 Gout, unspecified: Secondary | ICD-10-CM | POA: Insufficient documentation

## 2014-01-18 DIAGNOSIS — Z8669 Personal history of other diseases of the nervous system and sense organs: Secondary | ICD-10-CM | POA: Insufficient documentation

## 2014-01-18 DIAGNOSIS — Z7982 Long term (current) use of aspirin: Secondary | ICD-10-CM | POA: Insufficient documentation

## 2014-01-18 LAB — BASIC METABOLIC PANEL
BUN: 15 mg/dL (ref 6–23)
CO2: 19 mEq/L (ref 19–32)
Calcium: 8.7 mg/dL (ref 8.4–10.5)
Chloride: 105 mEq/L (ref 96–112)
Creatinine, Ser: 1.37 mg/dL — ABNORMAL HIGH (ref 0.50–1.35)
GFR calc Af Amer: 56 mL/min — ABNORMAL LOW (ref >90)
GFR calc non Af Amer: 49 mL/min — ABNORMAL LOW (ref >90)
Glucose, Bld: 113 mg/dL — ABNORMAL HIGH (ref 70–99)
Potassium: 3.1 mEq/L — ABNORMAL LOW (ref 3.7–5.3)
Sodium: 142 mEq/L (ref 137–147)

## 2014-01-18 LAB — URINALYSIS, ROUTINE W REFLEX MICROSCOPIC
Bilirubin Urine: NEGATIVE
Glucose, UA: NEGATIVE mg/dL
Hgb urine dipstick: NEGATIVE
Ketones, ur: NEGATIVE mg/dL
Leukocytes, UA: NEGATIVE
Nitrite: NEGATIVE
Protein, ur: 30 mg/dL — AB
Specific Gravity, Urine: 1.012 (ref 1.005–1.030)
Urobilinogen, UA: 0.2 mg/dL (ref 0.0–1.0)
pH: 5.5 (ref 5.0–8.0)

## 2014-01-18 LAB — RAPID URINE DRUG SCREEN, HOSP PERFORMED
Amphetamines: NOT DETECTED
Barbiturates: NOT DETECTED
Benzodiazepines: NOT DETECTED
Cocaine: NOT DETECTED
Opiates: NOT DETECTED
Tetrahydrocannabinol: NOT DETECTED

## 2014-01-18 LAB — URINE MICROSCOPIC-ADD ON

## 2014-01-18 LAB — CBC
HCT: 44.4 % (ref 39.0–52.0)
Hemoglobin: 14.6 g/dL (ref 13.0–17.0)
MCH: 26.4 pg (ref 26.0–34.0)
MCHC: 32.9 g/dL (ref 30.0–36.0)
MCV: 80.3 fL (ref 78.0–100.0)
Platelets: 185 10*3/uL (ref 150–400)
RBC: 5.53 MIL/uL (ref 4.22–5.81)
RDW: 16.1 % — ABNORMAL HIGH (ref 11.5–15.5)
WBC: 4.1 10*3/uL (ref 4.0–10.5)

## 2014-01-18 LAB — ETHANOL: Alcohol, Ethyl (B): 165 mg/dL — ABNORMAL HIGH (ref 0–11)

## 2014-01-18 MED ORDER — HYDROXYZINE HCL 25 MG PO TABS: 50.0000 mg | ORAL_TABLET | Freq: Every evening | ORAL | Status: DC | PRN

## 2014-01-18 MED ORDER — SODIUM CHLORIDE 0.9 % IV BOLUS (SEPSIS)
500.0000 mL | Freq: Once | INTRAVENOUS | Status: AC
  Administered 2014-01-18: 500 mL via INTRAVENOUS

## 2014-01-18 MED ORDER — POTASSIUM CHLORIDE CRYS ER 20 MEQ PO TBCR
40.0000 meq | EXTENDED_RELEASE_TABLET | Freq: Once | ORAL | Status: AC
Start: 2014-01-18 — End: 2014-01-18
  Administered 2014-01-18: 40 meq via ORAL
  Filled 2014-01-18: qty 2

## 2014-01-18 MED ORDER — LORAZEPAM 1 MG PO TABS
1.0000 mg | ORAL_TABLET | Freq: Three times a day (TID) | ORAL | Status: DC | PRN
  Administered 2014-01-18: 1 mg via ORAL
  Filled 2014-01-18: qty 1

## 2014-01-18 NOTE — ED Notes (Signed)
Pt was changed into blue scrubs, pt clothes and medications placed in personal belongings bag, one pair of shorts, one pair of boxers, one belt , locked up with with security is cell phone and wallet, $108.26 in cash. this was handed off to secured unit staff.

## 2014-01-18 NOTE — ED Notes (Signed)
Bed: [facility] Expected date:  Expected time:  Means of arrival:  Comments: EMS-ETOH 

## 2014-01-18 NOTE — Progress Notes (Signed)
  CARE MANAGEMENT ED NOTE 01/18/2014  Patient:  Trevor Murphy, Trevor Murphy   Account Number:  [id number]  Date Initiated:  01/18/2014  Documentation initiated by:  Livia Snellen  Subjective/Objective Assessment:   Patient presents to Ed intoxicated, stating that his wife is blind and he cannot see well and they are unable to do things for each other anymore.     Subjective/Objective Assessment Detail:     Action/Plan:   Action/Plan Detail:   Anticipated DC Date:       Status Recommendation to Physician:   Result of Recommendation:    Other ED Services  Consult [facility] [place]  Other    Choice offered to / List presented to:            Status of service:  Completed, signed off  ED Comments:   ED Comments Detail:  [facility] approached by ED  registration  regarding patient. After speaking with patient for a while, registration representative reports that patient states he is feeling suicidal.  EDCM made Dr. Canary Brim aware of patient's suicidal thoughts. Patient is still undergoing treatment, lab work etc. as per Dr. Canary Brim.  No further EDCM needs at this time.

## 2014-01-18 NOTE — ED Notes (Signed)
Patient's brother Trilby Drummer called, [phone].

## 2014-01-18 NOTE — ED Notes (Addendum)
Initial contact-pt A&O x4. Patient denies SI/HI. Per patient "me and my wife live together and she's blind and I can't see good and we're just tired-we can't do anything for each other anymore." Patient absolutely denies SI when asked three times. No other complaints at this time. Denies chest pain, fever, abdominal pain, chills. Awaiting MD. Did not receive report from EMS.

## 2014-01-18 NOTE — Consult Note (Signed)
[facility] Face-to-Face Psychiatry Consult   Reason for Consult:  Referral for psychiatric evaluation Referring Physician:  EDP Linker ULRICK METHOT is an 79 y.o. male. Total Time spent with patient: 20 minutes  Assessment: AXIS I:  Alcohol Abuse AXIS II:  Deferred AXIS III:   Past Medical History  Diagnosis Date  . Poor circulation   . Hernia     umbilical  . Gout   . Glaucoma   . Hypertension     does not see a cardiologist  . Arthritis   . Chronic kidney disease   . Headache(784.0)     " WHEN MY BLOOD PRESSURE IS UP"  . Other and unspecified hyperlipidemia   . OA (osteoarthritis)   . CAD (coronary artery disease)     nonobstructive  . History of acute renal failure   . Blindness of left eye   . CKD (chronic kidney disease) stage 3, GFR 30-59 ml/min 07/26/2012  . CVA (cerebral infarction) 07/28/2012   AXIS IV:  other psychosocial or environmental problems and problems with access to health care services AXIS V:  61-70 mild symptoms  Plan:  No evidence of imminent risk to self or others at present.   Patient does not meet criteria for psychiatric inpatient admission.  Subjective:   SUZANNE GARBERS is a 79 y.o. male patient who presented to [facility] ED voluntarily via EMS. Patient states "they brought me in an ambulance but I don't know why." Patient states he took his wife to dialysis, came home, "drank some brandy, layed down to watch TV and next thing I know I was up here." Patient states he has been "worried." He states he takes his wife to dialysis 3 times a week on the [employer] bus. States "I'm worried, worried, worried 'cause we're both real sick." Patient states he has HTN, blind in left eye and "sometimes my heart gets out of rhythm." He states "my wife is in bad shape all the way around; she's got Crohn's disease and is on dialysis 3 days a week." Mr. Roy states it is "getting harder to take care of each other; we're doing the best we can."   Patient denies SI, HI and AVH.  Patient states "we're trying to get assisted living placement where we can both stay that won't cost [place] a arm and a leg."   HPI:  79 yo male who presents to [facility] for evaluation and intoxication HPI Elements:   Location:  Mood. Quality:  Intoxicated. Severity:  Mild to moderate. Timing:  Drank brandy earlier in the day. Duration:  Daily. Context:  Stressors; declining health.  Past Psychiatric History: Past Medical History  Diagnosis Date  . Poor circulation   . Hernia     umbilical  . Gout   . Glaucoma   . Hypertension     does not see a cardiologist  . Arthritis   . Chronic kidney disease   . Headache(784.0)     " WHEN MY BLOOD PRESSURE IS UP"  . Other and unspecified hyperlipidemia   . OA (osteoarthritis)   . CAD (coronary artery disease)     nonobstructive  . History of acute renal failure   . Blindness of left eye   . CKD (chronic kidney disease) stage 3, GFR 30-59 ml/min 07/26/2012  . CVA (cerebral infarction) 07/28/2012    reports that he has never smoked. He has never used smokeless tobacco. He reports that he drinks about .6 ounces of alcohol per week. He  reports that he does not use illicit drugs. Family History  Problem Relation Age of Onset  . CVA Mother   . Hypertension Sister   . Hypertension Brother   . Hypertension Brother    Allergies:  No Known Allergies  ACT Assessment Complete:  No:   Past Psychiatric History: No Diagnosis:  Alcohol Abuse  Hospitalizations:  Medical admissions; no psychiatric  Outpatient Care:  None  Substance Abuse Care:  None  Self-Mutilation:  None  Suicidal Attempts:  None  Homicidal Behaviors:  None   Violent Behaviors:  None   Place of Residence:  Lives with wife in [place], [place] Marital Status:  Married Employed/Unemployed:  Unemployed Education:   Family Supports:  Step-daughter; brothers and sisters Objective: Blood pressure 145/81, pulse 84, temperature 98.5 F (36.9 C), temperature source Oral, resp. rate 18,  SpO2 100.00%.There is no weight on file to calculate BMI. Results for orders placed during the hospital encounter of 01/18/14 (from the past 72 hour(s))  URINE RAPID DRUG SCREEN (HOSP PERFORMED)     Status: None   Collection Time    01/18/14  8:00 PM      Result Value Ref Range   Opiates NONE DETECTED  NONE DETECTED   Cocaine NONE DETECTED  NONE DETECTED   Benzodiazepines NONE DETECTED  NONE DETECTED   Amphetamines NONE DETECTED  NONE DETECTED   Tetrahydrocannabinol NONE DETECTED  NONE DETECTED   Barbiturates NONE DETECTED  NONE DETECTED   Comment:            DRUG SCREEN FOR MEDICAL PURPOSES     ONLY.  IF CONFIRMATION IS NEEDED     FOR ANY PURPOSE, NOTIFY LAB     WITHIN 5 DAYS.                LOWEST DETECTABLE LIMITS     FOR URINE DRUG SCREEN     Drug Class       Cutoff (ng/mL)     Amphetamine      1000     Barbiturate      200     Benzodiazepine   902     Tricyclics       409     Opiates          300     Cocaine          300     THC              50  URINALYSIS, ROUTINE W REFLEX MICROSCOPIC     Status: Abnormal   Collection Time    01/18/14  8:00 PM      Result Value Ref Range   Color, Urine YELLOW  YELLOW   APPearance CLEAR  CLEAR   Specific Gravity, Urine 1.012  1.005 - 1.030   pH 5.5  5.0 - 8.0   Glucose, UA NEGATIVE  NEGATIVE mg/dL   Hgb urine dipstick NEGATIVE  NEGATIVE   Bilirubin Urine NEGATIVE  NEGATIVE   Ketones, ur NEGATIVE  NEGATIVE mg/dL   Protein, ur 30 (*) NEGATIVE mg/dL   Urobilinogen, UA 0.2  0.0 - 1.0 mg/dL   Nitrite NEGATIVE  NEGATIVE   Leukocytes, UA NEGATIVE  NEGATIVE  URINE MICROSCOPIC-ADD ON     Status: Abnormal   Collection Time    01/18/14  8:00 PM      Result Value Ref Range   Squamous Epithelial / LPF RARE  RARE   WBC, UA 0-2  <3 WBC/hpf  Bacteria, UA FEW (*) RARE   Casts GRANULAR CAST (*) NEGATIVE  CBC     Status: Abnormal   Collection Time    01/18/14  8:11 PM      Result Value Ref Range   WBC 4.1  4.0 - 10.5 K/uL   RBC 5.53  4.22  - 5.81 MIL/uL   Hemoglobin 14.6  13.0 - 17.0 g/dL   HCT 44.4  39.0 - 52.0 %   MCV 80.3  78.0 - 100.0 fL   MCH 26.4  26.0 - 34.0 pg   MCHC 32.9  30.0 - 36.0 g/dL   RDW 16.1 (*) 11.5 - 15.5 %   Platelets 185  150 - 400 K/uL  BASIC METABOLIC PANEL     Status: Abnormal   Collection Time    01/18/14  8:11 PM      Result Value Ref Range   Sodium 142  137 - 147 mEq/L   Potassium 3.1 (*) 3.7 - 5.3 mEq/L   Chloride 105  96 - 112 mEq/L   CO2 19  19 - 32 mEq/L   Glucose, Bld 113 (*) 70 - 99 mg/dL   BUN 15  6 - 23 mg/dL   Creatinine, Ser 1.37 (*) 0.50 - 1.35 mg/dL   Calcium 8.7  8.4 - 10.5 mg/dL   GFR calc non Af Amer 49 (*) >90 mL/min   GFR calc Af Amer 56 (*) >90 mL/min   Comment: (NOTE)     The eGFR has been calculated using the CKD EPI equation.     This calculation has not been validated in all clinical situations.     eGFR's persistently <90 mL/min signify possible Chronic Kidney     Disease.  ETHANOL     Status: Abnormal   Collection Time    01/18/14  8:11 PM      Result Value Ref Range   Alcohol, Ethyl (B) 165 (*) 0 - 11 mg/dL   Comment:            LOWEST DETECTABLE LIMIT FOR     SERUM ALCOHOL IS 11 mg/dL     FOR MEDICAL PURPOSES ONLY   Labs are reviewed and are pertinent for K+ 3.1, ETOH 165.  Current Facility-Administered Medications  Medication Dose Route Frequency Provider Last Rate Last Dose  . hydrOXYzine (ATARAX/VISTARIL) tablet 50 mg  50 mg Oral QHS PRN Lurena Nida, NP      . LORazepam (ATIVAN) tablet 1 mg  1 mg Oral Q8H PRN Lurena Nida, NP       Current Outpatient Prescriptions  Medication Sig Dispense Refill  . allopurinol (ZYLOPRIM) 150 mg TABS tablet Take 0.5 tablets (150 mg total) by mouth daily.  30 tablet  0  . amLODipine (NORVASC) 10 MG tablet Take 1 tablet (10 mg total) by mouth daily.  30 tablet  5  . aspirin 325 MG tablet Take 1 tablet (325 mg total) by mouth daily.      . carvedilol (COREG) 6.25 MG tablet Take 3 tablets (18.75 mg total) by mouth 2  (two) times daily with a meal.  90 tablet  0  . colchicine 0.6 MG tablet Take 1 tablet (0.6 mg total) by mouth daily.  30 tablet  0  . dorzolamide (TRUSOPT) 2 % ophthalmic solution Place 1 drop into both eyes 2 (two) times daily.       . hydrALAZINE (APRESOLINE) 100 MG tablet Take 1 tablet (100 mg total) by mouth every 8 (eight)  hours.  90 tablet  5  . ketorolac (ACULAR) 0.5 % ophthalmic solution Place 1 drop into both eyes 4 (four) times daily.       . nitroGLYCERIN (NITROSTAT) 0.4 MG SL tablet Place 1 tablet (0.4 mg total) under the tongue every 5 (five) minutes as needed for chest pain.  30 tablet  0  . rosuvastatin (CRESTOR) 5 MG tablet Take 1 tablet (5 mg total) by mouth at bedtime. Ok to use any generic available  30 tablet  0    Psychiatric Specialty Exam:     Blood pressure 145/81, pulse 84, temperature 98.5 F (36.9 C), temperature source Oral, resp. rate 18, SpO2 100.00%.There is no weight on file to calculate BMI.  General Appearance: Fairly Groomed  Engineer, water::  Good  Speech:  Slow  Volume:  Normal  Mood:  Euthymic  Affect:  Flat  Thought Process:  Coherent  Orientation:  Full (Time, Place, and Person)  Thought Content:  WDL  Suicidal Thoughts:  No  Homicidal Thoughts:  No  Memory:  Immediate;   Fair Recent;   Fair Remote;   Fair  Judgement:  Impaired  Insight:  Fair  Psychomotor Activity:  Normal  Concentration:  Fair  Recall:  [employer] of Knowledge:Fair  Language: Fair  Akathisia:  No  Handed:  Right  AIMS (if indicated):     Assets:  Desire for Improvement Housing  Sleep:      Musculoskeletal: Strength & Muscle Tone: within normal limits Gait & Station: normal Patient leans: N/A  Treatment Plan Summary: Observe overnight for possible discharge in the morning.  Disposition: Social work to see patient in the morning for assistance with initiating necessary paperwork for possible assisted living placement.   Serena Colonel, FNP-BC 01/18/2014 10:42  PM

## 2014-01-18 NOTE — ED Notes (Signed)
Patient denies feelings of SI, HI. Denies AVH. States that he does not know how he got to the hospital. Is oriented x3. Says he is very worried about his wife who is on dialysis and not doing well. States his sister gave him a ride home, he had whiskey, and next thing he knows he is in the hospital. States he is concerned that his house is not locked. Patient request medication to help him get some sleep.    Encouragement offered.   Patient safety maintained, Q 15 checks in place.

## 2014-01-18 NOTE — Progress Notes (Signed)
01/18/2014 A. Kalyan Barabas RNCM 2245pm EDCM spoke to patient at bedside.  Patient lives at home with his wife.  Patient reports his wife is now a patient at [facility].  Patient's wife is a dialysis patient.  Patient confirms heis able to perform all of his ADL's on his own. Patient reports he helps his wife with her ADL's.  Patient confirms his pcp is Dr. Glendale Chard.  Patient confirms he is active with [facility] with RN and PT.  Patient is very happy with this agency.  Patient told [facility] that he has Medicaid insurance.  Patient reports he would, "Like somewhere where me and my wife can be together, like assisted living."  [facility] consulted EDSW.  EDCM also received permission to place referral into [place].  EDCM provided patient with private duty nursing list, printed information regarding [facility] and printed information on [facility] [place].  Patient thankful for assistance.  No further EDCM needs at this time.

## 2014-01-18 NOTE — ED Provider Notes (Signed)
CSN: [id number]     Arrival date & time 01/18/14  1914 History   First MD Initiated Contact with Patient 01/18/14 1918     Chief Complaint  Patient presents with  . Suicidal     (Consider location/radiation/quality/duration/timing/severity/associated sxs/prior Treatment) HPI A LEVEL 5 CAVEAT PERTAINS DUE TO INTOXICATION.  Pt brought in by EMS, he reports that he has been drinking alcohol today. He states he has a lot of chronic health problems and so does his wife.  His wife was at dialysis today, he came home and started drinking alcohol.  He states he is very tired and is having difficulty caring for his wife.  Pt told some staff that he is suicidal.  However to me, he states he is just tired and stressed.  He states he does have home health care.    Past Medical History  Diagnosis Date  . Poor circulation   . Hernia     umbilical  . Gout   . Glaucoma   . Hypertension     does not see a cardiologist  . Arthritis   . Chronic kidney disease   . Headache(784.0)     " WHEN MY BLOOD PRESSURE IS UP"  . Other and unspecified hyperlipidemia   . OA (osteoarthritis)   . CAD (coronary artery disease)     nonobstructive  . History of acute renal failure   . Blindness of left eye   . CKD (chronic kidney disease) stage 3, GFR 30-59 ml/min 07/26/2012  . CVA (cerebral infarction) 07/28/2012   Past Surgical History  Procedure Laterality Date  . Hernia repair    . Ventral hernia repair  03/07/2012    Procedure: LAPAROSCOPIC VENTRAL HERNIA;  Surgeon: Gwenyth Ober, MD;  Location: [facility];  Service: General;  Laterality: N/A;  . Appendectomy    . Cataract extraction extracapsular Right 05/17/2013    Procedure: CATARACT EXTRACTION EXTRACAPSULAR WITH INTRAOCULAR LENS PLACEMENT (IOC) RIGHT EYE;  Surgeon: Marylynn Pearson, MD;  Location: [facility];  Service: Ophthalmology;  Laterality: Right;  . Cardiac catheterization  11/2003    EF 50-55%  . Cardiac catheterization  08/2009   Family History  Problem  Relation Age of Onset  . CVA Mother   . Hypertension Sister   . Hypertension Brother   . Hypertension Brother    History  Substance Use Topics  . Smoking status: Never Smoker   . Smokeless tobacco: Never Used  . Alcohol Use: 0.6 oz/week    1 Cans of beer per week     Comment: occassional, former heavy drinker    Review of Systems UNABLE TO OBTAIN ROS DUE TO LEVEL 5 CAVEAT    Allergies  Review of patient's allergies indicates no known allergies.  Home Medications   Prior to Admission medications   Medication Sig Start Date End Date Taking? Authorizing Provider  allopurinol (ZYLOPRIM) 100 MG tablet Take 100 mg by mouth daily.   Yes Historical Provider, MD  amLODipine (NORVASC) 10 MG tablet Take 1 tablet (10 mg total) by mouth daily. 12/18/13  Yes Ripudeep Krystal Eaton, MD  aspirin EC 81 MG tablet Take 81 mg by mouth daily.   Yes Historical Provider, MD  carvedilol (COREG) 6.25 MG tablet Take 18.75 mg by mouth 2 (two) times daily with a meal.   Yes Historical Provider, MD  colchicine 0.6 MG tablet Take 0.6 mg by mouth 2 (two) times daily.   Yes Historical Provider, MD  dorzolamide (TRUSOPT) 2 % ophthalmic solution  Place 1 drop into both eyes 2 (two) times daily.  01/03/14  Yes Historical Provider, MD  hydrALAZINE (APRESOLINE) 100 MG tablet Take 1 tablet (100 mg total) by mouth every 8 (eight) hours. 12/18/13  Yes Ripudeep Krystal Eaton, MD  nitroGLYCERIN (NITROSTAT) 0.4 MG SL tablet Place 1 tablet (0.4 mg total) under the tongue every 5 (five) minutes as needed for chest pain. 12/21/13  Yes Geradine Girt, DO  rosuvastatin (CRESTOR) 5 MG tablet Take 1 tablet (5 mg total) by mouth at bedtime. Ok to use any generic available 12/21/13  Yes Jessica U Vann, DO   BP 145/81  Pulse 84  Temp(Src) 98.5 F (36.9 C) (Oral)  Resp 18  SpO2 100% Vitals reviewed Physical Exam Physical Examination: General appearance - alert, tired and chronically ill appearing, and in no distress Mental status - alert,  oriented to person, place, and time Eyes - pupils equal and reactive, extraocular eye movements intact Mouth - mucous membranes moist, pharynx normal without lesions Chest - clear to auscultation, no wheezes, rales or rhonchi, symmetric air entry Heart - normal rate, regular rhythm, normal S1, S2, no murmurs, rubs, clicks or gallops Abdomen - soft, nontender, nondistended, no masses or organomegaly Extremities - peripheral pulses normal, no pedal edema, no clubbing or cyanosis Skin - normal coloration and turgor, no rashes Psych- mildly intoxicated appearing, flat affect  ED Course  Procedures (including critical care time)  8:57 PM d/w case management, she will look up placement factors and let social work know about the patient.  Will have TTS evaluate patient, he is inconsistent in saying that he is suicidal and then not suicidal.  He has been drinking etoh but level is 165, I feel that he can be assessed by TTS.  Psych holding orders written.  Labs Review Labs Reviewed  CBC - Abnormal; Notable for the following:    RDW 16.1 (*)    All other components within normal limits  BASIC METABOLIC PANEL - Abnormal; Notable for the following:    Potassium 3.1 (*)    Glucose, Bld 113 (*)    Creatinine, Ser 1.37 (*)    GFR calc non Af Amer 49 (*)    GFR calc Af Amer 56 (*)    All other components within normal limits  ETHANOL - Abnormal; Notable for the following:    Alcohol, Ethyl (B) 165 (*)    All other components within normal limits  URINALYSIS, ROUTINE W REFLEX MICROSCOPIC - Abnormal; Notable for the following:    Protein, ur 30 (*)    All other components within normal limits  URINE MICROSCOPIC-ADD ON - Abnormal; Notable for the following:    Bacteria, UA FEW (*)    Casts GRANULAR CAST (*)    All other components within normal limits  URINE RAPID DRUG SCREEN (HOSP PERFORMED)    Imaging Review No results found.   EKG Interpretation None      MDM   Final diagnoses:   Alcohol abuse    Pt presenting with alcohol intoxication, denies SI to me, has told other staff that he is suicidal due to stressors at home with his chronic medical conditions and caring for his wife.  He has home health arranged.  I have d/w case management, will also have TTS evaluate patient.      Threasa Beards, MD 01/18/14 2352

## 2014-01-19 DIAGNOSIS — F1010 Alcohol abuse, uncomplicated: Secondary | ICD-10-CM | POA: Diagnosis present

## 2014-01-19 HISTORY — DX: Alcohol abuse, uncomplicated: F10.10

## 2014-01-19 NOTE — Progress Notes (Signed)
01/19/2014 A. Alik Mawson RNCM 1320pm Patient reports he is currently receiving Meals on Wheels.

## 2014-01-19 NOTE — Progress Notes (Signed)
01/19/2014 A. Kalli Greenfield RNCM 1258pm [facility] consulted for home health services. EDCM spoke to [employer] for [place] who reports patient is no longer active with them since the sixth of this month.  EDCM infomed Erasmo Downer that patient will be needing an Therapist, sports and SW for assistance for ALF placement.  EDCM spoke to EDP who placed orders for home health RN and SW with [facility].  EDCM spoke to patient at bedside and  is agree able to receive home health services with [facility] again.  EDCM reviewed all resources presented to patient last evening such as Tax adviser, private duty nursing list, Print production planner.  EDCM provided patient with list of home health agencies of [place] highlighting [facility].  EDCM spoke to [place] from [facility] who confirms patient is active with [facility] and will convey message to patient's case manager and social worker regarding patient's current needs.  Patient's brother at bedside requesting to speak to someone regarding patient's condition.  EDCM asked [facility] staff to send patient's nurse in to speak to patient and his brother.  EDCM also discussed patient with TTS and will proviide patient with additional resources as well.  Erasmo Downer liason from [facility] confirmed she has received home health orders for patient.  No furhter EDCM needs at this time.

## 2014-01-19 NOTE — BHH Suicide Risk Assessment (Cosign Needed)
Suicide Risk Assessment  Discharge Assessment     Demographic Factors:  Male  Total Time spent with patient: 20 minutes   Mental Status Per Nursing Assessment::   On Admission:    tablet  Take 1 tablet (5 mg total) by mouth at bedtime. Ok to use any generic available  30 tablet  0    Psychiatric Specialty Exam:      Blood pressure 154/92, pulse 84, temperature 97.7 F (36.5 C), temperature source Oral, resp. rate 18, SpO2 96.00%.There is no weight on file to calculate BMI.   General Appearance: Casual   Eye Contact:: Good   Speech: Clear and Coherent and Normal Rate   Volume: Normal   Mood: Depressed   Affect: Congruent   Thought Process: Circumstantial and Goal Directed   Orientation: Full (Time, Place, and Person)   Thought Content: Rumination   Suicidal Thoughts: No   Homicidal Thoughts: No   Memory: Immediate; Fair  Recent; Fair  Remote; Fair   Judgement: Fair   Insight: Present   Psychomotor Activity: Normal   Concentration: Fair   Recall: [employer] of Knowledge:Good   Language: Good   Akathisia: No   Handed: Right   AIMS (if indicated):   Assets: Communication Skills  Desire for Improvement  Social Support   Sleep:   Musculoskeletal:  Strength & Muscle Tone: within normal limits  Gait & Station: normal  Patient leans: N/A  Current Mental Status by Physician: Patient denies suidal/homicidal ideation, psychosis, and parania  Loss Factors: NA  Historical Factors: NA  Risk Reduction Factors:   Sense of responsibility to family and Living with another person, especially a relative  Continued Clinical Symptoms:  None noted  Cognitive Features That Contribute To Risk:  None noted    Suicide Risk:  Minimal: No identifiable suicidal ideation.  Patients presenting with no risk factors but with morbid ruminations; may be classified as minimal risk based on the severity of the depressive symptoms Assessment:  AXIS I: Alcohol Abuse  AXIS II:  Deferred  AXIS III:  Past Medical History   Diagnosis  Date   .  Poor circulation    .  Hernia      umbilical   .  Gout    .  Glaucoma    .  Hypertension      does not see a cardiologist   .  Arthritis    .  Chronic kidney disease    .  Headache(784.0)      " WHEN MY BLOOD PRESSURE IS UP"   .  Other and unspecified hyperlipidemia    .  OA (osteoarthritis)    .  CAD (coronary artery disease)      nonobstructive   .  History of acute renal failure    .  Blindness of left eye    .  CKD (chronic kidney disease) stage 3, GFR 30-59 ml/min  07/26/2012   .  CVA (cerebral infarction)  07/28/2012    AXIS IV: other psychosocial or environmental problems  AXIS V: 61-70 mild symptoms      Plan Of Care/Follow-up recommendations:  Activity:  Resume usual activity Diet:  Resume usual diet  Is patient on multiple antipsychotic therapies at discharge:  No   Has Patient had three or more failed trials of antipsychotic monotherapy by history:  No  Recommended Plan for Multiple Antipsychotic Therapies: NA    Rankin, Shuvon, FNP-BC 01/19/2014, 1:26 PM

## 2014-01-19 NOTE — ED Notes (Signed)
Senior resources given to brother.

## 2014-01-19 NOTE — BH Assessment (Addendum)
[facility] Assessment Progress Note  Update:  @ 1300 - Pt's brother, Trilby Drummer [phone] - is here to pick pt up and take him home.  Pt was given outpatient referrals by this clinician and was also given resources by Case Management.  Per Dr. Dwyane Dee and Earleen Newport, NP, pt to be discharged home.  Pt's stepdaughter to be called to pick up pt [phone].  This clinician called and she is unable to pick the pt up, as she has to go to work, so she gave this Probation officer pt's son's number - Mahonri Milhouse - [phone].  He was called and stated he could pick up the pt on his lunch break today and to call when pt discharged.  Shaune Pascal, MS, [facility] Licensed Professional Counselor Triage Specialist

## 2014-01-19 NOTE — Progress Notes (Signed)
01/19/2014 A. Josep Luviano RNCM 1609pm EDCM received phone call from [place] reporting that Trevor Murphy is unable to provide services at this time.  Patient was discharged from [facility] services earlier this month.  Patient has a Education officer, museum connected with [place].  [facility] called phone number listed for patient of face sheet at [phone] and left message statting [facility] will not be providing services and left this EDCM's phone number if patient had any questions.  EDCM called [facility] to look for another phone number for patient without success, leaft a message, awaiting call back.  No further EDCM needs at this time.

## 2014-01-19 NOTE — Consult Note (Signed)
[facility] Face-to-Face Psychiatry Consult   Reason for Consult:  Alcohol detox Referring Physician:  EDP  Trevor Murphy is an 79 y.o. male. Total Time spent with patient: 45 minutes  Assessment: AXIS I:  Alcohol Abuse AXIS II:  Deferred AXIS III:   Past Medical History  Diagnosis Date  . Poor circulation   . Hernia     umbilical  . Gout   . Glaucoma   . Hypertension     does not see a cardiologist  . Arthritis   . Chronic kidney disease   . Headache(784.0)     " WHEN MY BLOOD PRESSURE IS UP"  . Other and unspecified hyperlipidemia   . OA (osteoarthritis)   . CAD (coronary artery disease)     nonobstructive  . History of acute renal failure   . Blindness of left eye   . CKD (chronic kidney disease) stage 3, GFR 30-59 ml/min 07/26/2012  . CVA (cerebral infarction) 07/28/2012   AXIS IV:  other psychosocial or environmental problems AXIS V:  61-70 mild symptoms  Plan:  No evidence of imminent risk to self or others at present.   Patient does not meet criteria for psychiatric inpatient admission. Supportive therapy provided about ongoing stressors. Discussed crisis plan, support from social network, calling 911, coming to the Emergency Department, and calling Suicide Hotline.  Subjective:   Trevor Murphy is a 79 y.o. male patient.  HPI:  Patient states "I am feeling better this morning and I'm ready to go home.  I live at home with my wife and I have my daughter who comes by to check on [place].  Patient denies suicidal ideation, homicidal ideation, psychosis, and paranoia.   Consulted with TTS, and SW resources and home care will be set up for patient.   HPI Elements:   Location:  Alcohol abuse. Quality:  chronic health problems. Severity:  depression. Timing:  several weeks.  Past Psychiatric History: Past Medical History  Diagnosis Date  . Poor circulation   . Hernia     umbilical  . Gout   . Glaucoma   . Hypertension     does not see a cardiologist  . Arthritis   .  Chronic kidney disease   . Headache(784.0)     " WHEN MY BLOOD PRESSURE IS UP"  . Other and unspecified hyperlipidemia   . OA (osteoarthritis)   . CAD (coronary artery disease)     nonobstructive  . History of acute renal failure   . Blindness of left eye   . CKD (chronic kidney disease) stage 3, GFR 30-59 ml/min 07/26/2012  . CVA (cerebral infarction) 07/28/2012    reports that he has never smoked. He has never used smokeless tobacco. He reports that he drinks about .6 ounces of alcohol per week. He reports that he does not use illicit drugs. Family History  Problem Relation Age of Onset  . CVA Mother   . Hypertension Sister   . Hypertension Brother   . Hypertension Brother            Allergies:  No Known Allergies  ACT Assessment Complete:  Yes:    Educational Status    Risk to Self: Risk to self Is patient at risk for suicide?: No Substance abuse history and/or treatment for substance abuse?: Yes  Risk to Others:    Abuse:    Prior Inpatient Therapy:    Prior Outpatient Therapy:    Additional Information:  Objective: Blood pressure 154/92, pulse 84, temperature 97.7 F (36.5 C), temperature source Oral, resp. rate 18, SpO2 96.00%.There is no weight on file to calculate BMI. Results for orders placed during the hospital encounter of 01/18/14 (from the past 72 hour(s))  URINE RAPID DRUG SCREEN (HOSP PERFORMED)     Status: None   Collection Time    01/18/14  8:00 PM      Result Value Ref Range   Opiates NONE DETECTED  NONE DETECTED   Cocaine NONE DETECTED  NONE DETECTED   Benzodiazepines NONE DETECTED  NONE DETECTED   Amphetamines NONE DETECTED  NONE DETECTED   Tetrahydrocannabinol NONE DETECTED  NONE DETECTED   Barbiturates NONE DETECTED  NONE DETECTED   Comment:            DRUG SCREEN FOR MEDICAL PURPOSES     ONLY.  IF CONFIRMATION IS NEEDED     FOR ANY PURPOSE, NOTIFY LAB     WITHIN 5 DAYS.                LOWEST DETECTABLE  LIMITS     FOR URINE DRUG SCREEN     Drug Class       Cutoff (ng/mL)     Amphetamine      1000     Barbiturate      200     Benzodiazepine   938     Tricyclics       182     Opiates          300     Cocaine          300     THC              50  URINALYSIS, ROUTINE W REFLEX MICROSCOPIC     Status: Abnormal   Collection Time    01/18/14  8:00 PM      Result Value Ref Range   Color, Urine YELLOW  YELLOW   APPearance CLEAR  CLEAR   Specific Gravity, Urine 1.012  1.005 - 1.030   pH 5.5  5.0 - 8.0   Glucose, UA NEGATIVE  NEGATIVE mg/dL   Hgb urine dipstick NEGATIVE  NEGATIVE   Bilirubin Urine NEGATIVE  NEGATIVE   Ketones, ur NEGATIVE  NEGATIVE mg/dL   Protein, ur 30 (*) NEGATIVE mg/dL   Urobilinogen, UA 0.2  0.0 - 1.0 mg/dL   Nitrite NEGATIVE  NEGATIVE   Leukocytes, UA NEGATIVE  NEGATIVE  URINE MICROSCOPIC-ADD ON     Status: Abnormal   Collection Time    01/18/14  8:00 PM      Result Value Ref Range   Squamous Epithelial / LPF RARE  RARE   WBC, UA 0-2  <3 WBC/hpf   Bacteria, UA FEW (*) RARE   Casts GRANULAR CAST (*) NEGATIVE  CBC     Status: Abnormal   Collection Time    01/18/14  8:11 PM      Result Value Ref Range   WBC 4.1  4.0 - 10.5 K/uL   RBC 5.53  4.22 - 5.81 MIL/uL   Hemoglobin 14.6  13.0 - 17.0 g/dL   HCT 44.4  39.0 - 52.0 %   MCV 80.3  78.0 - 100.0 fL   MCH 26.4  26.0 - 34.0 pg   MCHC 32.9  30.0 - 36.0 g/dL   RDW 16.1 (*) 11.5 - 15.5 %   Platelets 185  150 - 400 K/uL  BASIC METABOLIC PANEL  Status: Abnormal   Collection Time    01/18/14  8:11 PM      Result Value Ref Range   Sodium 142  137 - 147 mEq/L   Potassium 3.1 (*) 3.7 - 5.3 mEq/L   Chloride 105  96 - 112 mEq/L   CO2 19  19 - 32 mEq/L   Glucose, Bld 113 (*) 70 - 99 mg/dL   BUN 15  6 - 23 mg/dL   Creatinine, Ser 1.37 (*) 0.50 - 1.35 mg/dL   Calcium 8.7  8.4 - 10.5 mg/dL   GFR calc non Af Amer 49 (*) >90 mL/min   GFR calc Af Amer 56 (*) >90 mL/min   Comment: (NOTE)     The eGFR has been  calculated using the CKD EPI equation.     This calculation has not been validated in all clinical situations.     eGFR's persistently <90 mL/min signify possible Chronic Kidney     Disease.  ETHANOL     Status: Abnormal   Collection Time    01/18/14  8:11 PM      Result Value Ref Range   Alcohol, Ethyl (B) 165 (*) 0 - 11 mg/dL   Comment:            LOWEST DETECTABLE LIMIT FOR     SERUM ALCOHOL IS 11 mg/dL     FOR MEDICAL PURPOSES ONLY   Labs are reviewed see above values.  Medications reviewed and no changes made  Current Facility-Administered Medications  Medication Dose Route Frequency Provider Last Rate Last Dose  . hydrOXYzine (ATARAX/VISTARIL) tablet 50 mg  50 mg Oral QHS PRN Lurena Nida, NP      . LORazepam (ATIVAN) tablet 1 mg  1 mg Oral Q8H PRN Lurena Nida, NP   1 mg at 01/18/14 2243   Current Outpatient Prescriptions  Medication Sig Dispense Refill  . allopurinol (ZYLOPRIM) 100 MG tablet Take 100 mg by mouth daily.      [name] Kitchen amLODipine (NORVASC) 10 MG tablet Take 1 tablet (10 mg total) by mouth daily.  30 tablet  5  . aspirin EC 81 MG tablet Take 81 mg by mouth daily.      . carvedilol (COREG) 6.25 MG tablet Take 18.75 mg by mouth 2 (two) times daily with a meal.      . colchicine 0.6 MG tablet Take 0.6 mg by mouth 2 (two) times daily.      . dorzolamide (TRUSOPT) 2 % ophthalmic solution Place 1 drop into both eyes 2 (two) times daily.       . hydrALAZINE (APRESOLINE) 100 MG tablet Take 1 tablet (100 mg total) by mouth every 8 (eight) hours.  90 tablet  5  . nitroGLYCERIN (NITROSTAT) 0.4 MG SL tablet Place 1 tablet (0.4 mg total) under the tongue every 5 (five) minutes as needed for chest pain.  30 tablet  0  . rosuvastatin (CRESTOR) 5 MG tablet Take 1 tablet (5 mg total) by mouth at bedtime. Ok to use any generic available  30 tablet  0    Psychiatric Specialty Exam:     Blood pressure 154/92, pulse 84, temperature 97.7 F (36.5 C), temperature source Oral, resp.  rate 18, SpO2 96.00%.There is no weight on file to calculate BMI.  General Appearance: Casual  Eye Contact::  Good  Speech:  Clear and Coherent and Normal Rate  Volume:  Normal  Mood:  Depressed  Affect:  Congruent  Thought Process:  Circumstantial  and Goal Directed  Orientation:  Full (Time, Place, and Person)  Thought Content:  Rumination  Suicidal Thoughts:  No  Homicidal Thoughts:  No  Memory:  Immediate;   Fair Recent;   Fair Remote;   Fair  Judgement:  Fair  Insight:  Present  Psychomotor Activity:  Normal  Concentration:  Fair  Recall:  [employer] of Knowledge:Good  Language: Good  Akathisia:  No  Handed:  Right  AIMS (if indicated):     Assets:  Communication Skills Desire for Improvement Social Support  Sleep:      Musculoskeletal: Strength & Muscle Tone: within normal limits Gait & Station: normal Patient leans: N/A  Treatment Plan Summary: Discharge home with home health and resources for outpatient services  Earleen Newport, FNP-BC 01/19/2014 1:20 PM

## 2014-01-20 NOTE — Consult Note (Signed)
Patient seen, evaluated by me. Patient denies SI, HI, depresion, mania or psychosis. Patient discharged home, home health to be contacted

## 2014-01-22 ENCOUNTER — Encounter (HOSPITAL_COMMUNITY): Payer: Self-pay | Admitting: Emergency Medicine

## 2014-01-22 ENCOUNTER — Emergency Department (HOSPITAL_COMMUNITY)
Admission: EM | Admit: 2014-01-22 | Discharge: 2014-01-22 | Disposition: A | Discharge status: Home / Self Care | Payer: Medicare Other | Attending: Emergency Medicine | Admitting: Emergency Medicine

## 2014-01-22 DIAGNOSIS — G8929 Other chronic pain: Secondary | ICD-10-CM | POA: Insufficient documentation

## 2014-01-22 DIAGNOSIS — R079 Chest pain, unspecified: Secondary | ICD-10-CM | POA: Insufficient documentation

## 2014-01-22 DIAGNOSIS — H409 Unspecified glaucoma: Secondary | ICD-10-CM | POA: Insufficient documentation

## 2014-01-22 DIAGNOSIS — R42 Dizziness and giddiness: Secondary | ICD-10-CM | POA: Insufficient documentation

## 2014-01-22 DIAGNOSIS — M129 Arthropathy, unspecified: Secondary | ICD-10-CM | POA: Insufficient documentation

## 2014-01-22 DIAGNOSIS — Z8719 Personal history of other diseases of the digestive system: Secondary | ICD-10-CM | POA: Insufficient documentation

## 2014-01-22 DIAGNOSIS — N189 Chronic kidney disease, unspecified: Secondary | ICD-10-CM | POA: Insufficient documentation

## 2014-01-22 DIAGNOSIS — Z7982 Long term (current) use of aspirin: Secondary | ICD-10-CM | POA: Insufficient documentation

## 2014-01-22 DIAGNOSIS — M1990 Unspecified osteoarthritis, unspecified site: Secondary | ICD-10-CM | POA: Insufficient documentation

## 2014-01-22 DIAGNOSIS — I129 Hypertensive chronic kidney disease with stage 1 through stage 4 chronic kidney disease, or unspecified chronic kidney disease: Secondary | ICD-10-CM | POA: Insufficient documentation

## 2014-01-22 DIAGNOSIS — M109 Gout, unspecified: Secondary | ICD-10-CM | POA: Insufficient documentation

## 2014-01-22 DIAGNOSIS — E785 Hyperlipidemia, unspecified: Secondary | ICD-10-CM | POA: Insufficient documentation

## 2014-01-22 DIAGNOSIS — I2510 Atherosclerotic heart disease of native coronary artery without angina pectoris: Secondary | ICD-10-CM | POA: Insufficient documentation

## 2014-01-22 DIAGNOSIS — Z9889 Other specified postprocedural states: Secondary | ICD-10-CM | POA: Insufficient documentation

## 2014-01-22 DIAGNOSIS — Z8673 Personal history of transient ischemic attack (TIA), and cerebral infarction without residual deficits: Secondary | ICD-10-CM | POA: Insufficient documentation

## 2014-01-22 DIAGNOSIS — Z79899 Other long term (current) drug therapy: Secondary | ICD-10-CM | POA: Insufficient documentation

## 2014-01-22 DIAGNOSIS — H5440 Blindness, one eye, unspecified eye: Secondary | ICD-10-CM | POA: Insufficient documentation

## 2014-01-22 LAB — BASIC METABOLIC PANEL
BUN: 13 mg/dL (ref 6–23)
CO2: 23 mEq/L (ref 19–32)
Calcium: 9.3 mg/dL (ref 8.4–10.5)
Chloride: 104 mEq/L (ref 96–112)
Creatinine, Ser: 1.45 mg/dL — ABNORMAL HIGH (ref 0.50–1.35)
GFR calc Af Amer: 52 mL/min — ABNORMAL LOW (ref >90)
GFR calc non Af Amer: 45 mL/min — ABNORMAL LOW (ref >90)
Glucose, Bld: 107 mg/dL — ABNORMAL HIGH (ref 70–99)
Potassium: 3.9 mEq/L (ref 3.7–5.3)
Sodium: 143 mEq/L (ref 137–147)

## 2014-01-22 LAB — CBC
HCT: 47.3 % (ref 39.0–52.0)
Hemoglobin: 15.7 g/dL (ref 13.0–17.0)
MCH: 27.1 pg (ref 26.0–34.0)
MCHC: 33.2 g/dL (ref 30.0–36.0)
MCV: 81.7 fL (ref 78.0–100.0)
Platelets: 216 10*3/uL (ref 150–400)
RBC: 5.79 MIL/uL (ref 4.22–5.81)
RDW: 16.2 % — ABNORMAL HIGH (ref 11.5–15.5)
WBC: 5 10*3/uL (ref 4.0–10.5)

## 2014-01-22 LAB — PRO B NATRIURETIC PEPTIDE: Pro B Natriuretic peptide (BNP): 848.2 pg/mL — ABNORMAL HIGH (ref 0–450)

## 2014-01-22 LAB — I-STAT TROPONIN, ED: Troponin i, poc: 0.03 ng/mL (ref 0.00–0.08)

## 2014-01-22 LAB — PROTIME-INR
INR: 0.92 (ref 0.00–1.49)
Prothrombin Time: 12.2 seconds (ref 11.6–15.2)

## 2014-01-22 LAB — CBG MONITORING, ED: Glucose-Capillary: 121 mg/dL — ABNORMAL HIGH (ref 70–99)

## 2014-01-22 MED ORDER — SODIUM CHLORIDE 0.9 % IV BOLUS (SEPSIS)
500.0000 mL | Freq: Once | INTRAVENOUS | Status: AC
  Administered 2014-01-22: 500 mL via INTRAVENOUS

## 2014-01-22 NOTE — ED Notes (Signed)
Pt states that he was walking in the hospital to visit family when he "got too hot" pt states that he felt dizzy and had SOB. Pt states that he also has blurred vision. Pt states that this happens when his BP is elevated. No neuro deficits noted in triage. Pt is able to read but states that is it blurry.

## 2014-01-22 NOTE — ED Notes (Signed)
Pt ambulated on hallway no c/o dizziness or SOB at this time.

## 2014-01-22 NOTE — ED Provider Notes (Signed)
CSN: [id number]     Arrival date & time 01/22/14  1543 History   First MD Initiated Contact with Patient 01/22/14 1648     Chief Complaint  Patient presents with  . Dizziness  . Near Syncope     (Consider location/radiation/quality/duration/timing/severity/associated sxs/prior Treatment) Patient is a 79 y.o. male presenting with near-syncope.  Near Syncope This is a new problem. The current episode started today. The problem has been gradually improving. Associated symptoms include chest pain. Pertinent negatives include no abdominal pain, chills, congestion, coughing, fever, headaches, nausea, neck pain, numbness, rash, vomiting or weakness. Nothing aggravates the symptoms. He has tried nothing for the symptoms.    Past Medical History  Diagnosis Date  . Poor circulation   . Hernia     umbilical  . Gout   . Glaucoma   . Hypertension     does not see a cardiologist  . Arthritis   . Chronic kidney disease   . Headache(784.0)     " WHEN MY BLOOD PRESSURE IS UP"  . Other and unspecified hyperlipidemia   . OA (osteoarthritis)   . CAD (coronary artery disease)     nonobstructive  . History of acute renal failure   . Blindness of left eye   . CKD (chronic kidney disease) stage 3, GFR 30-59 ml/min 07/26/2012  . CVA (cerebral infarction) 07/28/2012   Past Surgical History  Procedure Laterality Date  . Hernia repair    . Ventral hernia repair  03/07/2012    Procedure: LAPAROSCOPIC VENTRAL HERNIA;  Surgeon: Gwenyth Ober, MD;  Location: [place];  Service: General;  Laterality: N/A;  . Appendectomy    . Cataract extraction extracapsular Right 05/17/2013    Procedure: CATARACT EXTRACTION EXTRACAPSULAR WITH INTRAOCULAR LENS PLACEMENT (IOC) RIGHT EYE;  Surgeon: Marylynn Pearson, MD;  Location: [place];  Service: Ophthalmology;  Laterality: Right;  . Cardiac catheterization  11/2003    EF 50-55%  . Cardiac catheterization  08/2009   Family History  Problem Relation Age of Onset  . CVA Mother    . Hypertension Sister   . Hypertension Brother   . Hypertension Brother    History  Substance Use Topics  . Smoking status: Never Smoker   . Smokeless tobacco: Never Used  . Alcohol Use: 0.6 oz/week    1 Cans of beer per week     Comment: occassional, former heavy drinker    Review of Systems  Constitutional: Negative for fever and chills.  HENT: Negative for congestion and rhinorrhea.   Eyes: Positive for visual disturbance (blind in one eye, blurry inother). Negative for pain.  Respiratory: Negative for cough and shortness of breath.   Cardiovascular: Positive for chest pain and near-syncope. Negative for palpitations.  Gastrointestinal: Negative for nausea, vomiting, abdominal pain, diarrhea and constipation.  Endocrine: Negative for polydipsia and polyuria.  Genitourinary: Negative for dysuria and flank pain.  Musculoskeletal: Negative for back pain and neck pain.  Skin: Negative for color change, rash and wound.  Neurological: Negative for dizziness, weakness, numbness and headaches.      Allergies  Review of patient's allergies indicates no known allergies.  Home Medications   Prior to Admission medications   Medication Sig Start Date End Date Taking? Authorizing Provider  allopurinol (ZYLOPRIM) 100 MG tablet Take 100 mg by mouth daily.   Yes Historical Provider, MD  amLODipine (NORVASC) 10 MG tablet Take 1 tablet (10 mg total) by mouth daily. 12/18/13  Yes Ripudeep Krystal Eaton, MD  aspirin EC  81 MG tablet Take 81 mg by mouth daily.   Yes Historical Provider, MD  carvedilol (COREG) 6.25 MG tablet Take 18.75 mg by mouth daily.    Yes Historical Provider, MD  colchicine 0.6 MG tablet Take 0.6 mg by mouth 2 (two) times daily.   Yes Historical Provider, MD  dorzolamide (TRUSOPT) 2 % ophthalmic solution Place 1 drop into both eyes 2 (two) times daily.  01/03/14  Yes Historical Provider, MD  hydrALAZINE (APRESOLINE) 100 MG tablet Take 1 tablet (100 mg total) by mouth every 8  (eight) hours. 12/18/13  Yes Ripudeep Krystal Eaton, MD  rosuvastatin (CRESTOR) 5 MG tablet Take 1 tablet (5 mg total) by mouth at bedtime. Ok to use any generic available 12/21/13  Yes Geradine Girt, DO  nitroGLYCERIN (NITROSTAT) 0.4 MG SL tablet Place 1 tablet (0.4 mg total) under the tongue every 5 (five) minutes as needed for chest pain. 12/21/13   Jessica U Vann, DO   BP 168/98  Pulse 62  Temp(Src) 97.6 F (36.4 C) (Oral)  Resp 19  SpO2 100% Physical Exam  Nursing note and vitals reviewed. Constitutional: He is oriented to person, place, and time. He appears well-developed and well-nourished.  HENT:  Head: Normocephalic and atraumatic.  Eyes: Conjunctivae and EOM are normal. Pupils are equal, round, and reactive to light.  Neck: Normal range of motion.  Cardiovascular: Normal rate and regular rhythm.   Pulmonary/Chest: Effort normal and breath sounds normal.  Abdominal: Soft. He exhibits no distension. There is no tenderness.  Musculoskeletal: Normal range of motion. He exhibits no edema and no tenderness.  Neurological: He is alert and oriented to person, place, and time.  No altered mental status, able to give full seemingly accurate history.  Face is symmetric, EOM's intact, pupils equal and reactive, tongue and uvula midline without deviation Upper and Lower extremity motor 5/5, intact pain perception in distal extremities, Finger to nose normal, heel to shin normal. Walks with assistance of cane but no evident ataxia.   Skin: Skin is warm and dry.    ED Course  Procedures (including critical care time) Labs Review Labs Reviewed  CBC - Abnormal; Notable for the following:    RDW 16.2 (*)    All other components within normal limits  BASIC METABOLIC PANEL - Abnormal; Notable for the following:    Glucose, Bld 107 (*)    Creatinine, Ser 1.45 (*)    GFR calc non Af Amer 45 (*)    GFR calc Af Amer 52 (*)    All other components within normal limits  PRO B NATRIURETIC PEPTIDE -  Abnormal; Notable for the following:    Pro B Natriuretic peptide (BNP) 848.2 (*)    All other components within normal limits  CBG MONITORING, ED - Abnormal; Notable for the following:    Glucose-Capillary 121 (*)    All other components within normal limits  PROTIME-INR  I-STAT TROPOININ, ED    Imaging Review No results found.   EKG Interpretation None      MDM   Final diagnoses:  None    80 yo M w/ h/lo chronic recurrent chest pain, syncope, gout, ARF, CAD presents for light headedness/ near syncope. Was walking outside then in the hospital and strted to get hot as he generally doesn't walk any types of distances. He then got a little sob, blurry vision, light headed and felt like he was going to pass out but didn't. Symptoms started improving quickly however still with  blurry vision out of his good eye.had a twinge of shart substernal non radiating chest pain that is not currently present. No other symptoms. Exam as above with relatively normal neuro exam.  Chest pain similar to previous patterns of his chronic chest pain, for which he has been following with his cardiologist for, so will not investigate that further.  Near syncope resolved, vision back to baseline adn ambulating/tolerating PO in ED after small amount of IVF. Thought to be likely 2/2 mild volume depletion and deconditioning as causes of symptoms. Will follow up with PCP, stable for d/c from ED.     Merrily Pew, MD 01/23/14 1536

## 2014-01-22 NOTE — ED Notes (Signed)
No belongings present at dc only a cane with the pt.

## 2014-01-23 NOTE — Consult Note (Signed)
Face to face evaluation and I agree with this note 

## 2014-01-23 NOTE — ED Provider Notes (Signed)
I saw and evaluated the patient, reviewed the resident's note and I agree with the findings and plan.   EKG Interpretation   Date/Time:  Monday January 22 2014 16:11:09 EDT Ventricular Rate:  67 PR Interval:  220 QRS Duration: 92 QT Interval:  474 QTC Calculation: 500 R Axis:   18 Text Interpretation:  Sinus rhythm with 1st degree A-V block Moderate  voltage criteria for LVH, may be normal variant Nonspecific T wave  abnormality Prolonged QT No significant change since last tracing  Confirmed by [facility]  MD, Jenny Reichmann ([id number]) on 01/22/2014 10:07:17 PM     Presyncope today, prior to presyncope had few minutes of typical vague CP without radiation or associated symptoms like he gets once a month and Cards is aware of that unchanged pattern.  Babette Relic, MD 01/25/14 1351

## 2014-01-29 ENCOUNTER — Ambulatory Visit (INDEPENDENT_AMBULATORY_CARE_PROVIDER_SITE_OTHER): Payer: Medicare Other | Admitting: Cardiology

## 2014-01-29 VITALS — BP 142/78 | HR 84 | Ht 70.0 in | Wt 167.8 lb

## 2014-01-29 DIAGNOSIS — N183 Chronic kidney disease, stage 3 unspecified: Secondary | ICD-10-CM

## 2014-01-29 DIAGNOSIS — I5032 Chronic diastolic (congestive) heart failure: Secondary | ICD-10-CM

## 2014-01-29 DIAGNOSIS — I498 Other specified cardiac arrhythmias: Secondary | ICD-10-CM

## 2014-01-29 DIAGNOSIS — I10 Essential (primary) hypertension: Secondary | ICD-10-CM

## 2014-01-29 DIAGNOSIS — R0602 Shortness of breath: Secondary | ICD-10-CM

## 2014-01-29 DIAGNOSIS — R7989 Other specified abnormal findings of blood chemistry: Secondary | ICD-10-CM

## 2014-01-29 DIAGNOSIS — I471 Supraventricular tachycardia: Secondary | ICD-10-CM

## 2014-01-29 DIAGNOSIS — R778 Other specified abnormalities of plasma proteins: Secondary | ICD-10-CM

## 2014-01-29 DIAGNOSIS — I509 Heart failure, unspecified: Secondary | ICD-10-CM

## 2014-01-29 MED ORDER — FUROSEMIDE 20 MG PO TABS: 20.0000 mg | ORAL_TABLET | Freq: Every day | ORAL | Status: DC

## 2014-01-29 MED ORDER — POTASSIUM CHLORIDE CRYS ER 10 MEQ PO TBCR: 10.0000 meq | EXTENDED_RELEASE_TABLET | Freq: Every day | ORAL | Status: DC

## 2014-01-29 NOTE — Patient Instructions (Signed)
Start lasix (furosemide) 20mg  daily.  Start KCL(potassium) 10 mEq daily.   Your physician recommends that you return for lab work in: 24 days--BMET/BNP.  Your physician recommends that you schedule a follow-up appointment in: 6 weeks with Dr Aundra Dubin.

## 2014-01-30 ENCOUNTER — Encounter: Payer: Self-pay | Admitting: Cardiology

## 2014-01-30 DIAGNOSIS — I5032 Chronic diastolic (congestive) heart failure: Secondary | ICD-10-CM | POA: Insufficient documentation

## 2014-01-30 NOTE — Progress Notes (Signed)
Patient ID: SINCER COLLIE, male   DOB: 1935-03-14, 79 y.o.   MRN: [id number] PCP: Dr. Baird Cancer  79 yo with history of HTN, diastolic CHF, and SVT presents for followup.  Patient was hospitalized in 5/15 with headache/blurry vision and was found to have hypertensive urgency with BP 220/130.  He had run out of his medications at home. He also had chest pain.  He initially ruled out for MI.  BP was lowered.  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  He had chest pain with the SVT and had a mild increase in troponin to 0.56.  CTA chest showed no PE.  Beta blocker was increased.  Echo showed EF 60-65%.  Lexiscan Cardiolite in 6/15 showed EF 54% with no ischemia or infarction.   Currently, he is short of breath walking up an incline or walking about 100 yards.  This is stable.  No orthopnea/PND.  No further tachypalpitations.  No chest pain.  SBP 130s at home.  He never smoked. He has cut back on ETOH and is now drinking rarely. Weight is down 1 lb.   Labs (5/15): K 4, creatinine 1.93 Labs (6/15): K 3.9, creatinine 1.45, pro-BNP 848, HCT [id number]  PMH: 1. Umbilical hernia s/p repair.  2. HTN  3. CKD: Likely from HTN  4. Glaucoma  5. CVA  6. Left eye blindness  7. Diastolic CHF: Echo (123456) with EF 55-60%, mild AI, mild MR.  Echo (5/15) with EF 60-65%, mild LVH, mild AI.  8. Gout  9. 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  Got adenosine from EMS in 2015 for SVT.  During 5/15 hospitalization had episode of short R-P tachycardia (possible AVNRT) terminated by adenosine.  11. Lexiscan Cardiolite (6/15) with EF 54%, no ischemia/infarction.  33. H/o ETOH abuse  SH: Married, nonsmoker, lives in [place], h/o ETOH abuse.   FH: CAD  ROS: All systems reviewed and negative except as per HPI.   Current Outpatient Prescriptions  Medication Sig Dispense Refill  . allopurinol (ZYLOPRIM) 100 MG tablet Take 100 mg by mouth daily.      [name] Kitchen amLODipine (NORVASC) 10  MG tablet Take 1 tablet (10 mg total) by mouth daily.  30 tablet  5  . aspirin EC 81 MG tablet Take 81 mg by mouth daily.      . carvedilol (COREG) 6.25 MG tablet Take 18.75 mg by mouth daily.       . colchicine 0.6 MG tablet Take 0.6 mg by mouth 2 (two) times daily.      . dorzolamide (TRUSOPT) 2 % ophthalmic solution Place 1 drop into both eyes 2 (two) times daily.       . hydrALAZINE (APRESOLINE) 100 MG tablet Take 1 tablet (100 mg total) by mouth every 8 (eight) hours.  90 tablet  5  . nitroGLYCERIN (NITROSTAT) 0.4 MG SL tablet Place 1 tablet (0.4 mg total) under the tongue every 5 (five) minutes as needed for chest pain.  30 tablet  0  . rosuvastatin (CRESTOR) 5 MG tablet Take 1 tablet (5 mg total) by mouth at bedtime. Ok to use any generic available  30 tablet  0  . furosemide (LASIX) 20 MG tablet Take 1 tablet (20 mg total) by mouth daily.  30 tablet  3  . potassium chloride (K-DUR,KLOR-CON) 10 MEQ tablet Take 1 tablet (10 mEq total) by mouth daily.  30 tablet  2   No current facility-administered medications for this visit.  BP 142/78  Pulse 84  Ht 5\' 10"  (1.778 m)  Wt 167 lb 12.8 oz (76.114 kg)  BMI 24.08 kg/m2 General: NAD Neck: JVP 8 cm, no thyromegaly or thyroid nodule.  Lungs: Clear to auscultation bilaterally with normal respiratory effort. CV: Nondisplaced PMI.  Heart regular S1/S2, +S4, no murmur.  No peripheral edema.  No carotid bruit.  Normal pedal pulses.  Abdomen: Soft, nontender, no hepatosplenomegaly, no distention.  Skin: Intact without lesions or rashes.  Neurologic: Alert and oriented x 3.  Psych: Normal affect. Extremities: No clubbing or cyanosis.   Assessment/Plan: 1. SVT: No further episodes of symptomatic SVT.  Reviewing ECG in SVT shows short R-P tachycardia that may be AVNRT.  Continue Coreg.  If he had frequent recurrences, could consider ablation.  2. HTN: BP is under better control.  Has been regularly in [id number] systolic at home. 3. CAD: Patient had  elevated troponin in the setting of SVT.  ETT-Cardiolite showed no ischemia or infarction, suspect demand ischemia.  Continue ASA.  4. Hyperlipidemia: Good lipids in 5/15.  5. Dyspnea: BNP elevated, mild elevation in JVP on exam.  Suspect mild volume overload.  I will add Lasix 20 mg daily + KCl 10 daily.  He will need BMET/BNP in 10 days.   6. CKD: Creatinine 1.45 in 6/15.   Loralie Champagne 01/30/2014

## 2014-02-08 ENCOUNTER — Other Ambulatory Visit (INDEPENDENT_AMBULATORY_CARE_PROVIDER_SITE_OTHER): Payer: Medicare Other

## 2014-02-08 DIAGNOSIS — R0602 Shortness of breath: Secondary | ICD-10-CM

## 2014-02-08 DIAGNOSIS — N183 Chronic kidney disease, stage 3 unspecified: Secondary | ICD-10-CM

## 2014-02-08 DIAGNOSIS — I10 Essential (primary) hypertension: Secondary | ICD-10-CM

## 2014-02-08 LAB — BRAIN NATRIURETIC PEPTIDE: Pro B Natriuretic peptide (BNP): 73 pg/mL (ref 0.0–100.0)

## 2014-02-08 LAB — BASIC METABOLIC PANEL
BUN: 24 mg/dL — ABNORMAL HIGH (ref 6–23)
CO2: 28 mEq/L (ref 19–32)
Calcium: 8.7 mg/dL (ref 8.4–10.5)
Chloride: 105 mEq/L (ref 96–112)
Creatinine, Ser: 1.8 mg/dL — ABNORMAL HIGH (ref 0.4–1.5)
GFR: 47.3 mL/min — ABNORMAL LOW (ref >60.00)
Glucose, Bld: 95 mg/dL (ref 70–99)
Potassium: 3.3 mEq/L — ABNORMAL LOW (ref 3.5–5.1)
Sodium: 140 mEq/L (ref 135–145)

## 2014-02-12 ENCOUNTER — Other Ambulatory Visit: Payer: Self-pay | Admitting: *Deleted

## 2014-02-12 DIAGNOSIS — I10 Essential (primary) hypertension: Secondary | ICD-10-CM

## 2014-02-12 DIAGNOSIS — R0602 Shortness of breath: Secondary | ICD-10-CM

## 2014-02-12 DIAGNOSIS — N183 Chronic kidney disease, stage 3 unspecified: Secondary | ICD-10-CM

## 2014-02-12 MED ORDER — POTASSIUM CHLORIDE CRYS ER 10 MEQ PO TBCR: 10.0000 meq | EXTENDED_RELEASE_TABLET | ORAL | Status: DC

## 2014-02-12 MED ORDER — FUROSEMIDE 20 MG PO TABS: 20.0000 mg | ORAL_TABLET | ORAL | Status: DC

## 2014-02-23 ENCOUNTER — Emergency Department (HOSPITAL_COMMUNITY): Payer: Medicare Other

## 2014-02-23 ENCOUNTER — Encounter (HOSPITAL_COMMUNITY): Payer: Self-pay | Admitting: Emergency Medicine

## 2014-02-23 ENCOUNTER — Emergency Department (HOSPITAL_COMMUNITY)
Admission: EM | Admit: 2014-02-23 | Discharge: 2014-02-23 | Disposition: A | Discharge status: Home / Self Care | Payer: Medicare Other | Attending: Emergency Medicine | Admitting: Emergency Medicine

## 2014-02-23 DIAGNOSIS — R42 Dizziness and giddiness: Secondary | ICD-10-CM

## 2014-02-23 DIAGNOSIS — N183 Chronic kidney disease, stage 3 unspecified: Secondary | ICD-10-CM | POA: Insufficient documentation

## 2014-02-23 DIAGNOSIS — M129 Arthropathy, unspecified: Secondary | ICD-10-CM | POA: Insufficient documentation

## 2014-02-23 DIAGNOSIS — R079 Chest pain, unspecified: Secondary | ICD-10-CM

## 2014-02-23 DIAGNOSIS — R519 Headache, unspecified: Secondary | ICD-10-CM

## 2014-02-23 DIAGNOSIS — M109 Gout, unspecified: Secondary | ICD-10-CM | POA: Insufficient documentation

## 2014-02-23 DIAGNOSIS — R51 Headache: Secondary | ICD-10-CM | POA: Insufficient documentation

## 2014-02-23 DIAGNOSIS — I129 Hypertensive chronic kidney disease with stage 1 through stage 4 chronic kidney disease, or unspecified chronic kidney disease: Secondary | ICD-10-CM | POA: Insufficient documentation

## 2014-02-23 DIAGNOSIS — H409 Unspecified glaucoma: Secondary | ICD-10-CM | POA: Insufficient documentation

## 2014-02-23 DIAGNOSIS — I2510 Atherosclerotic heart disease of native coronary artery without angina pectoris: Secondary | ICD-10-CM | POA: Insufficient documentation

## 2014-02-23 DIAGNOSIS — E785 Hyperlipidemia, unspecified: Secondary | ICD-10-CM | POA: Insufficient documentation

## 2014-02-23 DIAGNOSIS — Z79899 Other long term (current) drug therapy: Secondary | ICD-10-CM | POA: Insufficient documentation

## 2014-02-23 LAB — CBC WITH DIFFERENTIAL/PLATELET
Basophils Absolute: 0 10*3/uL (ref 0.0–0.1)
Basophils Relative: 0 % (ref 0–1)
Eosinophils Absolute: 0.1 10*3/uL (ref 0.0–0.7)
Eosinophils Relative: 3 % (ref 0–5)
HCT: 42.4 % (ref 39.0–52.0)
Hemoglobin: 13.9 g/dL (ref 13.0–17.0)
Lymphocytes Relative: 32 % (ref 12–46)
Lymphs Abs: 1.3 10*3/uL (ref 0.7–4.0)
MCH: 27 pg (ref 26.0–34.0)
MCHC: 32.8 g/dL (ref 30.0–36.0)
MCV: 82.3 fL (ref 78.0–100.0)
Monocytes Absolute: 0.3 10*3/uL (ref 0.1–1.0)
Monocytes Relative: 8 % (ref 3–12)
Neutro Abs: 2.3 10*3/uL (ref 1.7–7.7)
Neutrophils Relative %: 57 % (ref 43–77)
Platelets: 152 10*3/uL (ref 150–400)
RBC: 5.15 MIL/uL (ref 4.22–5.81)
RDW: 16.7 % — ABNORMAL HIGH (ref 11.5–15.5)
WBC: 4.1 10*3/uL (ref 4.0–10.5)

## 2014-02-23 LAB — RAPID URINE DRUG SCREEN, HOSP PERFORMED
Amphetamines: NOT DETECTED
Barbiturates: NOT DETECTED
Benzodiazepines: NOT DETECTED
Cocaine: NOT DETECTED
Opiates: NOT DETECTED
Tetrahydrocannabinol: NOT DETECTED

## 2014-02-23 LAB — COMPREHENSIVE METABOLIC PANEL
ALT: 17 U/L (ref 0–53)
AST: 22 U/L (ref 0–37)
Albumin: 3.4 g/dL — ABNORMAL LOW (ref 3.5–5.2)
Alkaline Phosphatase: 107 U/L (ref 39–117)
Anion gap: 13 (ref 5–15)
BUN: 24 mg/dL — ABNORMAL HIGH (ref 6–23)
CO2: 24 mEq/L (ref 19–32)
Calcium: 8.4 mg/dL (ref 8.4–10.5)
Chloride: 106 mEq/L (ref 96–112)
Creatinine, Ser: 1.68 mg/dL — ABNORMAL HIGH (ref 0.50–1.35)
GFR calc Af Amer: 44 mL/min — ABNORMAL LOW (ref >90)
GFR calc non Af Amer: 38 mL/min — ABNORMAL LOW (ref >90)
Glucose, Bld: 97 mg/dL (ref 70–99)
Potassium: 4.2 mEq/L (ref 3.7–5.3)
Sodium: 143 mEq/L (ref 137–147)
Total Bilirubin: 0.3 mg/dL (ref 0.3–1.2)
Total Protein: 6.9 g/dL (ref 6.0–8.3)

## 2014-02-23 LAB — URINALYSIS, ROUTINE W REFLEX MICROSCOPIC
Bilirubin Urine: NEGATIVE
Glucose, UA: NEGATIVE mg/dL
Hgb urine dipstick: NEGATIVE
Ketones, ur: NEGATIVE mg/dL
Leukocytes, UA: NEGATIVE
Nitrite: NEGATIVE
Protein, ur: NEGATIVE mg/dL
Specific Gravity, Urine: 1.013 (ref 1.005–1.030)
Urobilinogen, UA: 0.2 mg/dL (ref 0.0–1.0)
pH: 5.5 (ref 5.0–8.0)

## 2014-02-23 LAB — I-STAT TROPONIN, ED: Troponin i, poc: 0.03 ng/mL (ref 0.00–0.08)

## 2014-02-23 LAB — ETHANOL: Alcohol, Ethyl (B): <11 mg/dL (ref 0–11)

## 2014-02-23 MED ORDER — METOCLOPRAMIDE HCL 5 MG/ML IJ SOLN
10.0000 mg | Freq: Once | INTRAMUSCULAR | Status: AC
  Administered 2014-02-23: 10 mg via INTRAVENOUS
  Filled 2014-02-23: qty 2

## 2014-02-23 MED ORDER — DIPHENHYDRAMINE HCL 50 MG/ML IJ SOLN
25.0000 mg | Freq: Once | INTRAMUSCULAR | Status: AC
  Administered 2014-02-23: 25 mg via INTRAVENOUS
  Filled 2014-02-23: qty 1

## 2014-02-23 MED ORDER — ASPIRIN 81 MG PO CHEW
324.0000 mg | CHEWABLE_TABLET | Freq: Once | ORAL | Status: AC
  Administered 2014-02-23: 324 mg via ORAL
  Filled 2014-02-23: qty 4

## 2014-02-23 MED ORDER — SODIUM CHLORIDE 0.9 % IV BOLUS (SEPSIS)
1000.0000 mL | Freq: Once | INTRAVENOUS | Status: AC
  Administered 2014-02-23: 1000 mL via INTRAVENOUS

## 2014-02-23 NOTE — ED Notes (Addendum)
Pt now reporting he cant see normal and that started the day before yesterday. Dr. Roxanne Mins at bedside.

## 2014-02-23 NOTE — Discharge Instructions (Signed)
Take tylenol, motrin for headaches.   Follow up with your doctor.   Home health nurse will contact you.   Return to ER if you have worse headache, dizziness, chest pain.

## 2014-02-23 NOTE — Progress Notes (Signed)
  CARE MANAGEMENT ED NOTE 02/23/2014  Patient:  Trevor Murphy, Trevor Murphy   Account Number:  [id number]  Date Initiated:  02/23/2014  Documentation initiated by:  Edwyna Shell  Subjective/Objective Assessment:   79 yo male presenting to the ED with c/o mild loss of vision     Subjective/Objective Assessment Detail:     Action/Plan:   Patietn will be followed by [facility] RN for med management and monitoring of BP   Action/Plan Detail:   Anticipated DC Date:       Status Recommendation to Physician:   Result of Recommendation:  Agreed    DC Planning Services  CM consult  Other    Choice offered to / List presented to:  C-1 Patient     [place] arranged  HH-1 RN      [place].    Status of service:  Completed, signed off  ED Comments:   ED Comments Detail:  CM spoke with patient regarding ED visit. Patient stated that he was having difficulty seeing and was dizzy and when he came in to the ED his BP was elevated. This CM spoke with the patient regarding his ability to manage his care at home. The patient stated that he cares for his wife that is on dialysis and is blind. He states that his daughter checks on them and helps as much as she can. He states that he goes to dialysis and sits with his wife and has a hard time caring for himself at times due to the need to care for her. Discussed [facility] RN to help with BP management and medication management and patient stated that he feels that would be helpful. Confirmed with Pearletha Forge, [facility] liaison, that patient is eligible for services and she confirmed that he is. Patient agreeable to [facility] RN with [facility]. Spoke with EDP, Dr. Darl Householder, and order place for [facility] RN. No further CM needs identifies at this time.

## 2014-02-23 NOTE — ED Provider Notes (Signed)
Physical Exam  BP 176/97  Pulse 70  Temp(Src) 97.8 F (36.6 C) (Oral)  Resp 15  SpO2 100%  Physical Exam  ED Course  Procedures  Care assumed at sign out from Dr. Roxanne Mins. Patient had headache and chest pain for several days. Vague dizziness. Neuro exam unremarkable as per Dr. Roxanne Mins. Sign out pending MRI. MRI showed no acute stroke. Feels better after migraine cocktail. As per Dr. Roxanne Mins, trop neg x 1 sufficient and patient has no active chest pain. Stable for d/c. Social work saw patient and will give him home health referral.   Results for orders placed during the hospital encounter of 02/23/14  CBC WITH DIFFERENTIAL      Result Value Ref Range   WBC 4.1  4.0 - 10.5 K/uL   RBC 5.15  4.22 - 5.81 MIL/uL   Hemoglobin 13.9  13.0 - 17.0 g/dL   HCT 42.4  39.0 - 52.0 %   MCV 82.3  78.0 - 100.0 fL   MCH 27.0  26.0 - 34.0 pg   MCHC 32.8  30.0 - 36.0 g/dL   RDW 16.7 (*) 11.5 - 15.5 %   Platelets 152  150 - 400 K/uL   Neutrophils Relative % 57  43 - 77 %   Neutro Abs 2.3  1.7 - 7.7 K/uL   Lymphocytes Relative 32  12 - 46 %   Lymphs Abs 1.3  0.7 - 4.0 K/uL   Monocytes Relative 8  3 - 12 %   Monocytes Absolute 0.3  0.1 - 1.0 K/uL   Eosinophils Relative 3  0 - 5 %   Eosinophils Absolute 0.1  0.0 - 0.7 K/uL   Basophils Relative 0  0 - 1 %   Basophils Absolute 0.0  0.0 - 0.1 K/uL  COMPREHENSIVE METABOLIC PANEL      Result Value Ref Range   Sodium 143  137 - 147 mEq/L   Potassium 4.2  3.7 - 5.3 mEq/L   Chloride 106  96 - 112 mEq/L   CO2 24  19 - 32 mEq/L   Glucose, Bld 97  70 - 99 mg/dL   BUN 24 (*) 6 - 23 mg/dL   Creatinine, Ser 1.68 (*) 0.50 - 1.35 mg/dL   Calcium 8.4  8.4 - 10.5 mg/dL   Total Protein 6.9  6.0 - 8.3 g/dL   Albumin 3.4 (*) 3.5 - 5.2 g/dL   AST 22  0 - 37 U/L   ALT 17  0 - 53 U/L   Alkaline Phosphatase 107  39 - 117 U/L   Total Bilirubin 0.3  0.3 - 1.2 mg/dL   GFR calc non Af Amer 38 (*) >90 mL/min   GFR calc Af Amer 44 (*) >90 mL/min   Anion gap 13  5 - 15   URINALYSIS, ROUTINE W REFLEX MICROSCOPIC      Result Value Ref Range   Color, Urine YELLOW  YELLOW   APPearance CLEAR  CLEAR   Specific Gravity, Urine 1.013  1.005 - 1.030   pH 5.5  5.0 - 8.0   Glucose, UA NEGATIVE  NEGATIVE mg/dL   Hgb urine dipstick NEGATIVE  NEGATIVE   Bilirubin Urine NEGATIVE  NEGATIVE   Ketones, ur NEGATIVE  NEGATIVE mg/dL   Protein, ur NEGATIVE  NEGATIVE mg/dL   Urobilinogen, UA 0.2  0.0 - 1.0 mg/dL   Nitrite NEGATIVE  NEGATIVE   Leukocytes, UA NEGATIVE  NEGATIVE  ETHANOL      Result Value Ref  Range   Alcohol, Ethyl (B) <11  0 - 11 mg/dL  URINE RAPID DRUG SCREEN (HOSP PERFORMED)      Result Value Ref Range   Opiates NONE DETECTED  NONE DETECTED   Cocaine NONE DETECTED  NONE DETECTED   Benzodiazepines NONE DETECTED  NONE DETECTED   Amphetamines NONE DETECTED  NONE DETECTED   Tetrahydrocannabinol NONE DETECTED  NONE DETECTED   Barbiturates NONE DETECTED  NONE DETECTED  I-STAT TROPOININ, ED      Result Value Ref Range   Troponin i, poc 0.03  0.00 - 0.08 ng/mL   Comment 3            Ct Head Wo Contrast  02/23/2014   CLINICAL DATA:  Dizziness.  EXAM: CT HEAD WITHOUT CONTRAST  TECHNIQUE: Contiguous axial images were obtained from the base of the skull through the vertex without intravenous contrast.  COMPARISON:  Head CT scan 12/18/2013 and 11/27/2012. Brain MRI 12/16/2013.  FINDINGS: Atrophy and chronic microvascular ischemic change are again identified. No evidence of acute intracranial abnormality including infarct, hemorrhage, mass lesion, mass effect, midline shift or abnormal extra-axial fluid collection is seen. There is no hydrocephalus or pneumocephalus. Extensive mucosal thickening right sphenoid sinus is identified. The calvarium is intact.  IMPRESSION: No acute intracranial abnormality.  Atrophy and chronic microvascular ischemic change.  Marked mucosal thickening right sphenoid sinus.   Electronically Signed   By: Inge Rise M.D.   On: 02/23/2014  07:10   Mr Jodene Nam Head Wo Contrast  02/23/2014   CLINICAL DATA:  Dizziness since last night.  Headache.  EXAM: MRI HEAD WITHOUT CONTRAST  MRA HEAD WITHOUT CONTRAST  TECHNIQUE: Multiplanar, multiecho pulse sequences of the brain and surrounding structures were obtained without intravenous contrast. Angiographic images of the head were obtained using MRA technique without contrast.  COMPARISON:  CT head 02/23/2014.  MRI brain 12/16/2013.  FINDINGS: MRI HEAD FINDINGS  The diffusion-weighted images demonstrate no evidence for acute or subacute infarction. No hemorrhage or mass lesion is present. Moderate atrophy and generalized white matter changes are evident bilaterally. The ventricles are proportionate to the degree of atrophy. Remote lacunar infarcts are again noted within the right cerebellum and pons. Remote lacunar infarcts are present in the thalami bilaterally.  Flow is present in the major intracranial arteries. The globes and orbits are intact.  The lateral recess of the right sphenoid sinus is opacified. The previously seen nodule is somewhat obscured on today's study. Circumferential mucosal thickening is noted in the right greater left sphenoid sinus. There is scattered mucosal thickening throughout the ethmoid air cells and in the frontal sinuses bilaterally. Minimal mucosal disease is noted in the maxillary sinuses. The mastoid air cells are clear  MRA HEAD FINDINGS  The internal carotid arteries are within normal limits from the high cervical segments through the ICA termini bilaterally. The right A1 segment is aplastic. The anterior communicating artery is patent. The MCA bifurcations are intact. There is moderate attenuation of ACA and MCA branch vessels without a significant proximal stenosis or occlusion.  Both A2 segments fill the left vertebral artery is slightly dominant to the right. The PICA origins are visualized and normal. The left vertebral art scratch the posterior cerebral arteries are of  fetal type with prominent posterior communicating arteries bilaterally. A left P1 segment is noted. The basilar artery is intact. There is moderate attenuation of distal PCA branch vessels.  IMPRESSION: 1. No acute intracranial abnormality. 2. Stable atrophy and diffuse white matter disease. 3.  Remote lacunar infarcts in the basal ganglia, right pons, and cerebellum. 4. Increased sinus disease as described. 5. Moderate median small vessel disease without significant proximal stenosis, aneurysm, or branch vessel occlusion.   Electronically Signed   By: Lawrence Santiago M.D.   On: 02/23/2014 09:24   Mr Brain Wo Contrast  02/23/2014   CLINICAL DATA:  Dizziness since last night.  Headache.  EXAM: MRI HEAD WITHOUT CONTRAST  MRA HEAD WITHOUT CONTRAST  TECHNIQUE: Multiplanar, multiecho pulse sequences of the brain and surrounding structures were obtained without intravenous contrast. Angiographic images of the head were obtained using MRA technique without contrast.  COMPARISON:  CT head 02/23/2014.  MRI brain 12/16/2013.  FINDINGS: MRI HEAD FINDINGS  The diffusion-weighted images demonstrate no evidence for acute or subacute infarction. No hemorrhage or mass lesion is present. Moderate atrophy and generalized white matter changes are evident bilaterally. The ventricles are proportionate to the degree of atrophy. Remote lacunar infarcts are again noted within the right cerebellum and pons. Remote lacunar infarcts are present in the thalami bilaterally.  Flow is present in the major intracranial arteries. The globes and orbits are intact.  The lateral recess of the right sphenoid sinus is opacified. The previously seen nodule is somewhat obscured on today's study. Circumferential mucosal thickening is noted in the right greater left sphenoid sinus. There is scattered mucosal thickening throughout the ethmoid air cells and in the frontal sinuses bilaterally. Minimal mucosal disease is noted in the maxillary sinuses. The  mastoid air cells are clear  MRA HEAD FINDINGS  The internal carotid arteries are within normal limits from the high cervical segments through the ICA termini bilaterally. The right A1 segment is aplastic. The anterior communicating artery is patent. The MCA bifurcations are intact. There is moderate attenuation of ACA and MCA branch vessels without a significant proximal stenosis or occlusion.  Both A2 segments fill the left vertebral artery is slightly dominant to the right. The PICA origins are visualized and normal. The left vertebral art scratch the posterior cerebral arteries are of fetal type with prominent posterior communicating arteries bilaterally. A left P1 segment is noted. The basilar artery is intact. There is moderate attenuation of distal PCA branch vessels.  IMPRESSION: 1. No acute intracranial abnormality. 2. Stable atrophy and diffuse white matter disease. 3. Remote lacunar infarcts in the basal ganglia, right pons, and cerebellum. 4. Increased sinus disease as described. 5. Moderate median small vessel disease without significant proximal stenosis, aneurysm, or branch vessel occlusion.   Electronically Signed   By: Lawrence Santiago M.D.   On: 02/23/2014 09:24   Dg Chest Port 1 View  02/23/2014   CLINICAL DATA:  Chest pain.  EXAM: PORTABLE CHEST - 1 VIEW  COMPARISON:  Chest radiograph Dec 16, 2013 and CT of the chest Dec 18, 2013  FINDINGS: The cardiac silhouette remains mildly enlarged, tortuous aorta. No pleural effusions or focal consolidations. No pneumothorax.  Soft tissue planes and included osseous structures are nonsuspicious, mild broad thoracic dextroscoliosis and degenerative change.  IMPRESSION: No acute cardiopulmonary process.   Electronically Signed   By: Elon Alas   On: 02/23/2014 06:53         Wandra Arthurs, MD 02/23/14 [id number]

## 2014-02-23 NOTE — ED Notes (Signed)
Case worker at bedside.

## 2014-02-23 NOTE — ED Notes (Signed)
Dr. Glick at bedside.  

## 2014-02-23 NOTE — ED Notes (Signed)
Pt from home, presents to [facility] via POV reporting dizziness since last night at 1730 along with a headache. Also reporting intermittent left sided chest pain/pressure that started last night at 8pm, SOB. Pt A&OX4. Denies numbness/tingling, no facial droop, no other neuro symptoms, speaking clear complete sentences.

## 2014-02-23 NOTE — ED Provider Notes (Signed)
CSN: [id number]     Arrival date & time 02/23/14  [id number] History   First MD Initiated Contact with Patient 02/23/14 [phone]     Chief Complaint  Patient presents with  . Dizziness     (Consider location/radiation/quality/duration/timing/severity/associated sxs/prior Treatment) Patient is a 79 y.o. male presenting with dizziness. The history is provided by the patient.  Dizziness He is a somewhat vague and a poor historian but he apparently started having dizziness, headache, and chest pain yesterday. Headache is global and he rates pain at 7/10. It is worse with exposure to light but no associated nausea or vomiting. He has chronic vision changes which are no different from baseline. He complains of feeling swimmy headed. Nothing makes it worse or better. That has been present since at least yesterday afternoon. He is complaining of a dull left-sided chest pain or which she rates at 6/10. Nothing makes it better nothing makes it worse. He denies dyspnea, nausea, diaphoresis. He states that it was 2 days ago the last time that he felt normal. He denies any weakness or numbness or tingling. He states his balance has been off and he has had to walk with a cane but then he states that he always has to walk with a cane. He had been admitted to the hospital about 1.5 years ago with an episode of vertigo which was somewhat similar.  Past Medical History  Diagnosis Date  . Poor circulation   . Hernia     umbilical  . Gout   . Glaucoma   . Hypertension     does not see a cardiologist  . Arthritis   . Chronic kidney disease   . Headache(784.0)     " WHEN MY BLOOD PRESSURE IS UP"  . Other and unspecified hyperlipidemia   . OA (osteoarthritis)   . CAD (coronary artery disease)     nonobstructive  . History of acute renal failure   . Blindness of left eye   . CKD (chronic kidney disease) stage 3, GFR 30-59 ml/min 07/26/2012  . CVA (cerebral infarction) 07/28/2012   Past Surgical History  Procedure  Laterality Date  . Hernia repair    . Ventral hernia repair  03/07/2012    Procedure: LAPAROSCOPIC VENTRAL HERNIA;  Surgeon: Gwenyth Ober, MD;  Location: [facility];  Service: General;  Laterality: N/A;  . Appendectomy    . Cataract extraction extracapsular Right 05/17/2013    Procedure: CATARACT EXTRACTION EXTRACAPSULAR WITH INTRAOCULAR LENS PLACEMENT (IOC) RIGHT EYE;  Surgeon: Marylynn Pearson, MD;  Location: [facility];  Service: Ophthalmology;  Laterality: Right;  . Cardiac catheterization  11/2003    EF 50-55%  . Cardiac catheterization  08/2009   Family History  Problem Relation Age of Onset  . CVA Mother   . Hypertension Sister   . Hypertension Brother   . Hypertension Brother    History  Substance Use Topics  . Smoking status: Never Smoker   . Smokeless tobacco: Never Used  . Alcohol Use: 0.6 oz/week    1 Cans of beer per week     Comment: occassional, former heavy drinker    Review of Systems  Neurological: Positive for dizziness.  All other systems reviewed and are negative.     Allergies  Review of patient's allergies indicates no known allergies.  Home Medications   Prior to Admission medications   Medication Sig Start Date End Date Taking? Authorizing Provider  allopurinol (ZYLOPRIM) 100 MG tablet Take 100 mg by mouth daily.  Historical Provider, MD  amLODipine (NORVASC) 10 MG tablet Take 1 tablet (10 mg total) by mouth daily. 12/18/13   Ripudeep Krystal Eaton, MD  aspirin EC 81 MG tablet Take 81 mg by mouth daily.    Historical Provider, MD  carvedilol (COREG) 6.25 MG tablet Take 18.75 mg by mouth daily.     Historical Provider, MD  colchicine 0.6 MG tablet Take 0.6 mg by mouth 2 (two) times daily.    Historical Provider, MD  dorzolamide (TRUSOPT) 2 % ophthalmic solution Place 1 drop into both eyes 2 (two) times daily.  01/03/14   Historical Provider, MD  furosemide (LASIX) 20 MG tablet Take 1 tablet (20 mg total) by mouth every other day. 02/12/14   Larey Dresser, MD   hydrALAZINE (APRESOLINE) 100 MG tablet Take 1 tablet (100 mg total) by mouth every 8 (eight) hours. 12/18/13   Ripudeep Krystal Eaton, MD  nitroGLYCERIN (NITROSTAT) 0.4 MG SL tablet Place 1 tablet (0.4 mg total) under the tongue every 5 (five) minutes as needed for chest pain. 12/21/13   Geradine Girt, DO  potassium chloride (K-DUR,KLOR-CON) 10 MEQ tablet Take 1 tablet (10 mEq total) by mouth every other day. 02/12/14   Larey Dresser, MD  rosuvastatin (CRESTOR) 5 MG tablet Take 1 tablet (5 mg total) by mouth at bedtime. Ok to use any generic available 12/21/13   Geradine Girt, DO   BP 168/90  Pulse 66  Temp(Src) 97.8 F (36.6 C) (Oral)  Resp 19  SpO2 100% Physical Exam  Nursing note and vitals reviewed.  79 year old male, resting comfortably and in no acute distress. Vital signs are significant for hypertension with blood pressure 160/90. Oxygen saturation is 100%, which is normal. Head is normocephalic and atraumatic. PERRLA, EOMI. Oropharynx is clear. Fundi show no hemorrhage, exudate, or papilledema. Neck is nontender and supple without adenopathy or JVD. There are no carotid bruits. Back is nontender and there is no CVA tenderness. Lungs are clear without rales, wheezes, or rhonchi. Chest is nontender. Heart has regular rate and rhythm without murmur. Abdomen is soft, flat, nontender without masses or hepatosplenomegaly and peristalsis is normoactive. Extremities have no cyanosis or edema, full range of motion is present. Skin is warm and dry without rash. Neurologic: Mental status is normal, cranial nerves are intact, there are no motor or sensory deficits. Dizziness is not reproduced by head movement. Finger to nose testing is normal. On Romberg testing, he is mildly unsteady without any tendency to fall in any one particular direction.  ED Course  Procedures (including critical care time) Labs Review Results for orders placed during the hospital encounter of 02/23/14  CBC WITH  DIFFERENTIAL      Result Value Ref Range   WBC 4.1  4.0 - 10.5 K/uL   RBC 5.15  4.22 - 5.81 MIL/uL   Hemoglobin 13.9  13.0 - 17.0 g/dL   HCT 42.4  39.0 - 52.0 %   MCV 82.3  78.0 - 100.0 fL   MCH 27.0  26.0 - 34.0 pg   MCHC 32.8  30.0 - 36.0 g/dL   RDW 16.7 (*) 11.5 - 15.5 %   Platelets 152  150 - 400 K/uL   Neutrophils Relative % 57  43 - 77 %   Neutro Abs 2.3  1.7 - 7.7 K/uL   Lymphocytes Relative 32  12 - 46 %   Lymphs Abs 1.3  0.7 - 4.0 K/uL   Monocytes Relative 8  3 -  12 %   Monocytes Absolute 0.3  0.1 - 1.0 K/uL   Eosinophils Relative 3  0 - 5 %   Eosinophils Absolute 0.1  0.0 - 0.7 K/uL   Basophils Relative 0  0 - 1 %   Basophils Absolute 0.0  0.0 - 0.1 K/uL  COMPREHENSIVE METABOLIC PANEL      Result Value Ref Range   Sodium 143  137 - 147 mEq/L   Potassium 4.2  3.7 - 5.3 mEq/L   Chloride 106  96 - 112 mEq/L   CO2 24  19 - 32 mEq/L   Glucose, Bld 97  70 - 99 mg/dL   BUN 24 (*) 6 - 23 mg/dL   Creatinine, Ser 1.68 (*) 0.50 - 1.35 mg/dL   Calcium 8.4  8.4 - 10.5 mg/dL   Total Protein 6.9  6.0 - 8.3 g/dL   Albumin 3.4 (*) 3.5 - 5.2 g/dL   AST 22  0 - 37 U/L   ALT 17  0 - 53 U/L   Alkaline Phosphatase 107  39 - 117 U/L   Total Bilirubin 0.3  0.3 - 1.2 mg/dL   GFR calc non Af Amer 38 (*) >90 mL/min   GFR calc Af Amer 44 (*) >90 mL/min   Anion gap 13  5 - 15  ETHANOL      Result Value Ref Range   Alcohol, Ethyl (B) <11  0 - 11 mg/dL  I-STAT TROPOININ, ED      Result Value Ref Range   Troponin i, poc 0.03  0.00 - 0.08 ng/mL   Comment 3            Imaging Review Ct Head Wo Contrast  02/23/2014   CLINICAL DATA:  Dizziness.  EXAM: CT HEAD WITHOUT CONTRAST  TECHNIQUE: Contiguous axial images were obtained from the base of the skull through the vertex without intravenous contrast.  COMPARISON:  Head CT scan 12/18/2013 and 11/27/2012. Brain MRI 12/16/2013.  FINDINGS: Atrophy and chronic microvascular ischemic change are again identified. No evidence of acute intracranial  abnormality including infarct, hemorrhage, mass lesion, mass effect, midline shift or abnormal extra-axial fluid collection is seen. There is no hydrocephalus or pneumocephalus. Extensive mucosal thickening right sphenoid sinus is identified. The calvarium is intact.  IMPRESSION: No acute intracranial abnormality.  Atrophy and chronic microvascular ischemic change.  Marked mucosal thickening right sphenoid sinus.   Electronically Signed   By: Inge Rise M.D.   On: 02/23/2014 07:10   Dg Chest Port 1 View  02/23/2014   CLINICAL DATA:  Chest pain.  EXAM: PORTABLE CHEST - 1 VIEW  COMPARISON:  Chest radiograph Dec 16, 2013 and CT of the chest Dec 18, 2013  FINDINGS: The cardiac silhouette remains mildly enlarged, tortuous aorta. No pleural effusions or focal consolidations. No pneumothorax.  Soft tissue planes and included osseous structures are nonsuspicious, mild broad thoracic dextroscoliosis and degenerative change.  IMPRESSION: No acute cardiopulmonary process.   Electronically Signed   By: Elon Alas   On: 02/23/2014 06:53     EKG Interpretation   Date/Time:  Friday February 23 2014 06:20:59 EDT Ventricular Rate:  74 PR Interval:  283 QRS Duration: 97 QT Interval:  473 QTC Calculation: 525 R Axis:   53 Text Interpretation:  Sinus rhythm Ventricular premature complex Prolonged  PR interval Probable LVH with secondary repol abnrm Prolonged QT interval  Baseline wander in lead(s) V4 When compared with ECG of 01/22/2014,  Premature ventricular complexes are now Present  Confirmed by [facility]  MD,  Chee Dimon ([id number]) on 02/23/2014 6:38:46 AM      MDM   Final diagnoses:  Dizziness  Nonintractable headache  Chest pain, unspecified chest pain type    Multiple complaints including vertigo, headache, chest discomfort. His ECG is unchanged except for presence of the PVC. Old records are reviewed and he had been admitted in December 2013 for vertigo I did have an MRI which showed a questionable  frontal lobe infarct which was probably not related to his vertigo. He is well outside a code stroke a window was last known normal being 2 days ago. However, stroke and cardiac workups are initiated. He'll be given a headache cocktail for his headache.  Initial workup is negative. He is currently getting MRI scan. He'll need to be assessed regarding response to headache cocktail and MR result in need to be checked. Case is signed out to Dr. Darl Householder.  Delora Fuel, MD [phone] [id number]

## 2014-03-10 ENCOUNTER — Encounter (HOSPITAL_COMMUNITY): Payer: Self-pay | Admitting: Emergency Medicine

## 2014-03-10 ENCOUNTER — Inpatient Hospital Stay (HOSPITAL_COMMUNITY)
Admission: EM | Admit: 2014-03-10 | Discharge: 2014-03-12 | DRG: 683 | Disposition: A | Discharge status: Home / Self Care | Payer: Medicare Other | Attending: Internal Medicine | Admitting: Internal Medicine

## 2014-03-10 DIAGNOSIS — N289 Disorder of kidney and ureter, unspecified: Secondary | ICD-10-CM

## 2014-03-10 DIAGNOSIS — E785 Hyperlipidemia, unspecified: Secondary | ICD-10-CM

## 2014-03-10 DIAGNOSIS — Z9119 Patient's noncompliance with other medical treatment and regimen: Secondary | ICD-10-CM

## 2014-03-10 DIAGNOSIS — R7989 Other specified abnormal findings of blood chemistry: Secondary | ICD-10-CM

## 2014-03-10 DIAGNOSIS — I471 Supraventricular tachycardia, unspecified: Secondary | ICD-10-CM

## 2014-03-10 DIAGNOSIS — R0789 Other chest pain: Secondary | ICD-10-CM | POA: Diagnosis present

## 2014-03-10 DIAGNOSIS — R079 Chest pain, unspecified: Secondary | ICD-10-CM | POA: Diagnosis present

## 2014-03-10 DIAGNOSIS — R42 Dizziness and giddiness: Secondary | ICD-10-CM

## 2014-03-10 DIAGNOSIS — R0989 Other specified symptoms and signs involving the circulatory and respiratory systems: Secondary | ICD-10-CM

## 2014-03-10 DIAGNOSIS — I2510 Atherosclerotic heart disease of native coronary artery without angina pectoris: Secondary | ICD-10-CM | POA: Diagnosis present

## 2014-03-10 DIAGNOSIS — M1990 Unspecified osteoarthritis, unspecified site: Secondary | ICD-10-CM | POA: Diagnosis present

## 2014-03-10 DIAGNOSIS — Z09 Encounter for follow-up examination after completed treatment for conditions other than malignant neoplasm: Secondary | ICD-10-CM

## 2014-03-10 DIAGNOSIS — I169 Hypertensive crisis, unspecified: Secondary | ICD-10-CM | POA: Diagnosis present

## 2014-03-10 DIAGNOSIS — K432 Incisional hernia without obstruction or gangrene: Secondary | ICD-10-CM

## 2014-03-10 DIAGNOSIS — N1832 Chronic kidney disease, stage 3b: Secondary | ICD-10-CM | POA: Diagnosis present

## 2014-03-10 DIAGNOSIS — R778 Other specified abnormalities of plasma proteins: Secondary | ICD-10-CM

## 2014-03-10 DIAGNOSIS — F1010 Alcohol abuse, uncomplicated: Secondary | ICD-10-CM

## 2014-03-10 DIAGNOSIS — I10 Essential (primary) hypertension: Secondary | ICD-10-CM

## 2014-03-10 DIAGNOSIS — I129 Hypertensive chronic kidney disease with stage 1 through stage 4 chronic kidney disease, or unspecified chronic kidney disease: Principal | ICD-10-CM | POA: Diagnosis present

## 2014-03-10 DIAGNOSIS — N183 Chronic kidney disease, stage 3 unspecified: Secondary | ICD-10-CM

## 2014-03-10 DIAGNOSIS — H5440 Blindness, one eye, unspecified eye: Secondary | ICD-10-CM | POA: Diagnosis present

## 2014-03-10 DIAGNOSIS — Z91199 Patient's noncompliance with other medical treatment and regimen due to unspecified reason: Secondary | ICD-10-CM

## 2014-03-10 DIAGNOSIS — I160 Hypertensive urgency: Secondary | ICD-10-CM

## 2014-03-10 DIAGNOSIS — H409 Unspecified glaucoma: Secondary | ICD-10-CM | POA: Diagnosis present

## 2014-03-10 DIAGNOSIS — N189 Chronic kidney disease, unspecified: Secondary | ICD-10-CM

## 2014-03-10 DIAGNOSIS — I509 Heart failure, unspecified: Secondary | ICD-10-CM | POA: Diagnosis present

## 2014-03-10 DIAGNOSIS — N179 Acute kidney failure, unspecified: Secondary | ICD-10-CM

## 2014-03-10 DIAGNOSIS — Z8673 Personal history of transient ischemic attack (TIA), and cerebral infarction without residual deficits: Secondary | ICD-10-CM

## 2014-03-10 DIAGNOSIS — I5032 Chronic diastolic (congestive) heart failure: Secondary | ICD-10-CM

## 2014-03-10 DIAGNOSIS — M25561 Pain in right knee: Secondary | ICD-10-CM

## 2014-03-11 ENCOUNTER — Encounter (HOSPITAL_COMMUNITY): Payer: Self-pay | Admitting: Internal Medicine

## 2014-03-11 ENCOUNTER — Emergency Department (HOSPITAL_COMMUNITY): Payer: Medicare Other

## 2014-03-11 DIAGNOSIS — R42 Dizziness and giddiness: Secondary | ICD-10-CM | POA: Diagnosis present

## 2014-03-11 DIAGNOSIS — Z9119 Patient's noncompliance with other medical treatment and regimen: Secondary | ICD-10-CM | POA: Diagnosis not present

## 2014-03-11 DIAGNOSIS — N183 Chronic kidney disease, stage 3 unspecified: Secondary | ICD-10-CM | POA: Diagnosis present

## 2014-03-11 DIAGNOSIS — Z91199 Patient's noncompliance with other medical treatment and regimen due to unspecified reason: Secondary | ICD-10-CM | POA: Diagnosis not present

## 2014-03-11 DIAGNOSIS — I160 Hypertensive urgency: Secondary | ICD-10-CM | POA: Diagnosis present

## 2014-03-11 DIAGNOSIS — E785 Hyperlipidemia, unspecified: Secondary | ICD-10-CM | POA: Diagnosis present

## 2014-03-11 DIAGNOSIS — I129 Hypertensive chronic kidney disease with stage 1 through stage 4 chronic kidney disease, or unspecified chronic kidney disease: Secondary | ICD-10-CM | POA: Diagnosis present

## 2014-03-11 DIAGNOSIS — I509 Heart failure, unspecified: Secondary | ICD-10-CM | POA: Diagnosis present

## 2014-03-11 DIAGNOSIS — M1990 Unspecified osteoarthritis, unspecified site: Secondary | ICD-10-CM | POA: Diagnosis present

## 2014-03-11 DIAGNOSIS — I2510 Atherosclerotic heart disease of native coronary artery without angina pectoris: Secondary | ICD-10-CM | POA: Diagnosis present

## 2014-03-11 DIAGNOSIS — I5032 Chronic diastolic (congestive) heart failure: Secondary | ICD-10-CM | POA: Diagnosis present

## 2014-03-11 DIAGNOSIS — H5440 Blindness, one eye, unspecified eye: Secondary | ICD-10-CM | POA: Diagnosis present

## 2014-03-11 DIAGNOSIS — R079 Chest pain, unspecified: Secondary | ICD-10-CM | POA: Diagnosis present

## 2014-03-11 DIAGNOSIS — F1010 Alcohol abuse, uncomplicated: Secondary | ICD-10-CM | POA: Diagnosis present

## 2014-03-11 DIAGNOSIS — Z8673 Personal history of transient ischemic attack (TIA), and cerebral infarction without residual deficits: Secondary | ICD-10-CM | POA: Diagnosis not present

## 2014-03-11 DIAGNOSIS — H409 Unspecified glaucoma: Secondary | ICD-10-CM | POA: Diagnosis present

## 2014-03-11 DIAGNOSIS — I10 Essential (primary) hypertension: Secondary | ICD-10-CM

## 2014-03-11 DIAGNOSIS — R0789 Other chest pain: Secondary | ICD-10-CM | POA: Diagnosis present

## 2014-03-11 LAB — CBC WITH DIFFERENTIAL/PLATELET
Basophils Absolute: 0 10*3/uL (ref 0.0–0.1)
Basophils Relative: 0 % (ref 0–1)
Eosinophils Absolute: 0.1 10*3/uL (ref 0.0–0.7)
Eosinophils Relative: 1 % (ref 0–5)
HCT: 46.1 % (ref 39.0–52.0)
Hemoglobin: 15.3 g/dL (ref 13.0–17.0)
Lymphocytes Relative: 18 % (ref 12–46)
Lymphs Abs: 1 10*3/uL (ref 0.7–4.0)
MCH: 27.4 pg (ref 26.0–34.0)
MCHC: 33.2 g/dL (ref 30.0–36.0)
MCV: 82.6 fL (ref 78.0–100.0)
Monocytes Absolute: 0.6 10*3/uL (ref 0.1–1.0)
Monocytes Relative: 11 % (ref 3–12)
Neutro Abs: 4 10*3/uL (ref 1.7–7.7)
Neutrophils Relative %: 70 % (ref 43–77)
Platelets: 164 10*3/uL (ref 150–400)
RBC: 5.58 MIL/uL (ref 4.22–5.81)
RDW: 17.7 % — ABNORMAL HIGH (ref 11.5–15.5)
WBC: 5.8 10*3/uL (ref 4.0–10.5)

## 2014-03-11 LAB — BASIC METABOLIC PANEL
Anion gap: 18 — ABNORMAL HIGH (ref 5–15)
BUN: 17 mg/dL (ref 6–23)
CO2: 21 mEq/L (ref 19–32)
Calcium: 8.6 mg/dL (ref 8.4–10.5)
Chloride: 100 mEq/L (ref 96–112)
Creatinine, Ser: 1.39 mg/dL — ABNORMAL HIGH (ref 0.50–1.35)
GFR calc Af Amer: 55 mL/min — ABNORMAL LOW (ref >90)
GFR calc non Af Amer: 48 mL/min — ABNORMAL LOW (ref >90)
Glucose, Bld: 102 mg/dL — ABNORMAL HIGH (ref 70–99)
Potassium: 4.5 mEq/L (ref 3.7–5.3)
Sodium: 139 mEq/L (ref 137–147)

## 2014-03-11 LAB — TROPONIN I
Troponin I: <0.3 ng/mL (ref <0.30)
Troponin I: <0.3 ng/mL (ref <0.30)
Troponin I: <0.3 ng/mL (ref <0.30)
Troponin I: <0.3 ng/mL (ref <0.30)
Troponin I: <0.3 ng/mL (ref <0.30)

## 2014-03-11 MED ORDER — ONDANSETRON HCL 4 MG/2ML IJ SOLN
4.0000 mg | Freq: Once | INTRAMUSCULAR | Status: AC
  Administered 2014-03-11: 4 mg via INTRAVENOUS
  Filled 2014-03-11: qty 2

## 2014-03-11 MED ORDER — ONDANSETRON HCL 4 MG/2ML IJ SOLN: 4.0000 mg | Freq: Four times a day (QID) | INTRAMUSCULAR | Status: DC | PRN

## 2014-03-11 MED ORDER — PANTOPRAZOLE SODIUM 40 MG PO TBEC
40.0000 mg | DELAYED_RELEASE_TABLET | Freq: Every day | ORAL | Status: DC
  Administered 2014-03-11 – 2014-03-12 (×2): 40 mg via ORAL
  Filled 2014-03-11: qty 1

## 2014-03-11 MED ORDER — HYDRALAZINE HCL 20 MG/ML IJ SOLN
10.0000 mg | INTRAMUSCULAR | Status: DC | PRN
  Administered 2014-03-11: 10 mg via INTRAVENOUS
  Filled 2014-03-11: qty 1

## 2014-03-11 MED ORDER — VITAMIN B-1 100 MG PO TABS
100.0000 mg | ORAL_TABLET | Freq: Every day | ORAL | Status: DC
  Administered 2014-03-11 – 2014-03-12 (×2): 100 mg via ORAL
  Filled 2014-03-11 (×2): qty 1

## 2014-03-11 MED ORDER — ALLOPURINOL 100 MG PO TABS
100.0000 mg | ORAL_TABLET | Freq: Every day | ORAL | Status: DC
  Administered 2014-03-11 – 2014-03-12 (×2): 100 mg via ORAL
  Filled 2014-03-11 (×2): qty 1

## 2014-03-11 MED ORDER — DORZOLAMIDE HCL 2 % OP SOLN
1.0000 [drp] | Freq: Two times a day (BID) | OPHTHALMIC | Status: DC
Start: 2014-03-11 — End: 2014-03-12
  Administered 2014-03-11 – 2014-03-12 (×3): 1 [drp] via OPHTHALMIC
  Filled 2014-03-11: qty 10

## 2014-03-11 MED ORDER — LORAZEPAM 1 MG PO TABS: 0.0000 mg | ORAL_TABLET | Freq: Four times a day (QID) | ORAL | Status: DC

## 2014-03-11 MED ORDER — BLOOD PRESSURE CONTROL BOOK
Freq: Once | Status: AC
  Administered 2014-03-11: 15:00:00
  Filled 2014-03-11: qty 1

## 2014-03-11 MED ORDER — COLCHICINE 0.6 MG PO TABS
0.6000 mg | ORAL_TABLET | Freq: Two times a day (BID) | ORAL | Status: DC
  Administered 2014-03-11 – 2014-03-12 (×3): 0.6 mg via ORAL
  Filled 2014-03-11 (×4): qty 1

## 2014-03-11 MED ORDER — FOLIC ACID 1 MG PO TABS
1.0000 mg | ORAL_TABLET | Freq: Every day | ORAL | Status: DC
  Administered 2014-03-11 – 2014-03-12 (×2): 1 mg via ORAL
  Filled 2014-03-11 (×2): qty 1

## 2014-03-11 MED ORDER — ACTIVE PARTNERSHIP FOR HEALTH OF YOUR HEART BOOK
Freq: Once | Status: AC
  Administered 2014-03-11: 13:00:00
  Filled 2014-03-11: qty 1

## 2014-03-11 MED ORDER — LORAZEPAM 1 MG PO TABS: 0.0000 mg | ORAL_TABLET | Freq: Two times a day (BID) | ORAL | Status: DC

## 2014-03-11 MED ORDER — CARVEDILOL 6.25 MG PO TABS
18.7500 mg | ORAL_TABLET | Freq: Every day | ORAL | Status: DC
  Administered 2014-03-11 – 2014-03-12 (×2): 18.75 mg via ORAL
  Filled 2014-03-11 (×2): qty 1

## 2014-03-11 MED ORDER — ADULT MULTIVITAMIN W/MINERALS CH
1.0000 | ORAL_TABLET | Freq: Every day | ORAL | Status: DC
  Administered 2014-03-11 – 2014-03-12 (×2): 1 via ORAL
  Filled 2014-03-11 (×2): qty 1

## 2014-03-11 MED ORDER — LABETALOL HCL 5 MG/ML IV SOLN
20.0000 mg | INTRAVENOUS | Status: DC | PRN
  Administered 2014-03-11: 20 mg via INTRAVENOUS
  Filled 2014-03-11: qty 4

## 2014-03-11 MED ORDER — THIAMINE HCL 100 MG/ML IJ SOLN
100.0000 mg | Freq: Every day | INTRAMUSCULAR | Status: DC
  Filled 2014-03-11 (×2): qty 1

## 2014-03-11 MED ORDER — ACETAMINOPHEN 325 MG PO TABS
650.0000 mg | ORAL_TABLET | ORAL | Status: DC | PRN
  Administered 2014-03-11: 650 mg via ORAL
  Filled 2014-03-11: qty 2

## 2014-03-11 MED ORDER — ISOSORBIDE MONONITRATE ER 30 MG PO TB24
30.0000 mg | ORAL_TABLET | Freq: Every day | ORAL | Status: DC
  Administered 2014-03-11 – 2014-03-12 (×2): 30 mg via ORAL
  Filled 2014-03-11 (×2): qty 1

## 2014-03-11 MED ORDER — LORAZEPAM 2 MG/ML IJ SOLN
1.0000 mg | Freq: Four times a day (QID) | INTRAMUSCULAR | Status: DC | PRN
Start: 2014-03-11 — End: 2014-03-12

## 2014-03-11 MED ORDER — ENOXAPARIN SODIUM 40 MG/0.4ML ~~LOC~~ SOLN
40.0000 mg | SUBCUTANEOUS | Status: DC
  Administered 2014-03-11: 40 mg via SUBCUTANEOUS
  Filled 2014-03-11 (×2): qty 0.4

## 2014-03-11 MED ORDER — HYDRALAZINE HCL 50 MG PO TABS
100.0000 mg | ORAL_TABLET | Freq: Three times a day (TID) | ORAL | Status: DC
  Administered 2014-03-11 – 2014-03-12 (×4): 100 mg via ORAL
  Filled 2014-03-11 (×7): qty 2

## 2014-03-11 MED ORDER — LORAZEPAM 1 MG PO TABS: 1.0000 mg | ORAL_TABLET | Freq: Four times a day (QID) | ORAL | Status: DC | PRN

## 2014-03-11 MED ORDER — MORPHINE SULFATE 2 MG/ML IJ SOLN: 2.0000 mg | INTRAMUSCULAR | Status: DC | PRN

## 2014-03-11 MED ORDER — EXERCISE FOR HEART AND HEALTH BOOK
Freq: Once | Status: AC
  Administered 2014-03-11: 13:00:00
  Filled 2014-03-11: qty 1

## 2014-03-11 MED ORDER — MORPHINE SULFATE 4 MG/ML IJ SOLN
4.0000 mg | Freq: Once | INTRAMUSCULAR | Status: DC
  Administered 2014-03-11: 4 mg via INTRAVENOUS
  Filled 2014-03-11: qty 1

## 2014-03-11 MED ORDER — ASPIRIN EC 81 MG PO TBEC
81.0000 mg | DELAYED_RELEASE_TABLET | Freq: Every day | ORAL | Status: DC
  Administered 2014-03-11 – 2014-03-12 (×2): 81 mg via ORAL
  Filled 2014-03-11 (×2): qty 1

## 2014-03-11 MED ORDER — CLONIDINE HCL 0.1 MG PO TABS
0.1000 mg | ORAL_TABLET | Freq: Two times a day (BID) | ORAL | Status: DC
  Administered 2014-03-11 – 2014-03-12 (×3): 0.1 mg via ORAL
  Filled 2014-03-11 (×4): qty 1

## 2014-03-11 MED ORDER — AMLODIPINE BESYLATE 10 MG PO TABS
10.0000 mg | ORAL_TABLET | Freq: Every day | ORAL | Status: DC
  Administered 2014-03-11 – 2014-03-12 (×2): 10 mg via ORAL
  Filled 2014-03-11 (×3): qty 1

## 2014-03-11 MED ORDER — MORPHINE SULFATE 4 MG/ML IJ SOLN
4.0000 mg | Freq: Once | INTRAMUSCULAR | Status: AC
  Administered 2014-03-11: 4 mg via INTRAVENOUS
  Filled 2014-03-11: qty 1

## 2014-03-11 NOTE — ED Notes (Signed)
Pt from home. Reports developing dizziness, blurred vision in right eye, and N/V, headache this am. Developed chest tightness around 5pm and called EMS. Pt's BP found to be 210/130 upon EMS arrival. EMS administered 2 nitroglycerin tabs en route to ER and brought BP down to 179/113 upon ER arrival. EMS also gave pt 324mg  baby ASA. Pt reported to EMS that chest tightness is still 6/10. Pt is blind in left eye and has glaucoma in both eyes. Pt was just discharged from hospital for same complaints in mid July.

## 2014-03-11 NOTE — Progress Notes (Signed)
Patient admitted few hours ago for hypertensive crisis with atypical chest pain, off note he has history of hypertension and noncompliance with medications. Recently underwent a chemical stress test 4-6 weeks ago which was unremarkable, echo done a few months ago shows EF of 60% with no wall motion abnormality.  EKG nonacute, 3 sets of troponin negative, chest pain pressure free, counseled on medication compliance, have adjusted his blood pressure medications and have added scheduled Norvasc and Catapres for better control. Continue his Coreg and hydralazine. As needed IV hydralazine. We'll monitor and titrate blood pressure as needed.

## 2014-03-11 NOTE — ED Notes (Signed)
Patient denies any Headache or chest pain.

## 2014-03-11 NOTE — Progress Notes (Signed)
Pt with poor appetite refusing to eat , willing to drink juice, denies nausea no emesis this shift. Pt reports his wife did not go to HD on Fri. And when this happens it makes him quite worried because she is "hard to deal with", he also reports the daughters come and try to "talk" to her and everyone seems to be quite upset and he reported to me this makes him "very upset" . Pt reports he took his B/P med on Fri. But did not on Sat. D/T feeling nauseated.

## 2014-03-11 NOTE — H&P (Signed)
PCP:     Maximino Greenland, MD   [facility]  Chief Complaint:  Chest pain  HPI: Trevor Murphy is a 79 y.o. male   has a past medical history of Poor circulation; Hernia; Gout; Glaucoma; Hypertension; Arthritis; Chronic kidney disease; Headache(784.0); Other and unspecified hyperlipidemia; OA (osteoarthritis); CAD (coronary artery disease); History of acute renal failure; Blindness of left eye; CKD (chronic kidney disease) stage 3, GFR 30-59 ml/min (07/26/2012); and CVA (cerebral infarction) (07/28/2012).   Presented with  Patient with hx of noncompliance and previous admission hypertensive urgency complicated by SVT and troponin leak. With negative Cardiolite from June 2015 presents with elevated blood pressure up to 223/101 and chest pain. Patient states he forgot to take his medications yesterday. Had chest ain in the center of his chest for 4 hours some shortness of breath. Reports feeling light headed when he stands up. Have ben started on lasix since June.  Of note on 7/17 he was evaluated in ER for dizziness and had unremarkable work up including MRI of the brain.  Hospitalist was called for admission for chest Pain  Review of Systems:    Pertinent positives include: chest pain, dyspnea on exertion  Constitutional:  No weight loss, night sweats, Fevers, chills, fatigue, weight loss  HEENT:  No headaches, Difficulty swallowing,Tooth/dental problems,Sore throat,  No sneezing, itching, ear ache, nasal congestion, post nasal drip,  Cardio-vascular:  No  Orthopnea, PND, anasarca, dizziness, palpitations.no Bilateral lower extremity swelling  GI:  No heartburn, indigestion, abdominal pain, nausea, vomiting, diarrhea, change in bowel habits, loss of appetite, melena, blood in stool, hematemesis Resp:  no shortness of breath at rest. No, No excess mucus, no productive cough, No non-productive cough, No coughing up of blood.No change in color of mucus.No wheezing. Skin:  no rash  or lesions. No jaundice GU:  no dysuria, change in color of urine, no urgency or frequency. No straining to urinate.  No flank pain.  Musculoskeletal:  No joint pain or no joint swelling. No decreased range of motion. No back pain.  Psych:  No change in mood or affect. No depression or anxiety. No memory loss.  Neuro: no localizing neurological complaints, no tingling, no weakness, no double vision, no gait abnormality, no slurred speech, no confusion  Otherwise ROS are negative except for above, 10 systems were reviewed  Past Medical History: Past Medical History  Diagnosis Date  . Poor circulation   . Hernia     umbilical  . Gout   . Glaucoma   . Hypertension     does not see a cardiologist  . Arthritis   . Chronic kidney disease   . Headache(784.0)     " WHEN MY BLOOD PRESSURE IS UP"  . Other and unspecified hyperlipidemia   . OA (osteoarthritis)   . CAD (coronary artery disease)     nonobstructive  . History of acute renal failure   . Blindness of left eye   . CKD (chronic kidney disease) stage 3, GFR 30-59 ml/min 07/26/2012  . CVA (cerebral infarction) 07/28/2012   Past Surgical History  Procedure Laterality Date  . Hernia repair    . Ventral hernia repair  03/07/2012    Procedure: LAPAROSCOPIC VENTRAL HERNIA;  Surgeon: Gwenyth Ober, MD;  Location: [facility];  Service: General;  Laterality: N/A;  . Appendectomy    . Cataract extraction extracapsular Right 05/17/2013    Procedure: CATARACT EXTRACTION EXTRACAPSULAR WITH INTRAOCULAR LENS PLACEMENT ([place]) RIGHT EYE;  Surgeon: Marylynn Pearson,  MD;  Location: [facility];  Service: Ophthalmology;  Laterality: Right;  . Cardiac catheterization  11/2003    EF 50-55%  . Cardiac catheterization  08/2009     Medications: Prior to Admission medications   Medication Sig Start Date End Date Taking? Authorizing Provider  acetaminophen (TYLENOL) 500 MG tablet Take 1,000 mg by mouth every 6 (six) hours as needed for headache.   Yes Historical  Provider, MD  allopurinol (ZYLOPRIM) 100 MG tablet Take 100 mg by mouth daily.   Yes Historical Provider, MD  aspirin EC 81 MG tablet Take 81 mg by mouth daily.   Yes Historical Provider, MD  carvedilol (COREG) 6.25 MG tablet Take 18.75 mg by mouth daily.    Yes Historical Provider, MD  colchicine 0.6 MG tablet Take 0.6 mg by mouth 2 (two) times daily.   Yes Historical Provider, MD  dorzolamide (TRUSOPT) 2 % ophthalmic solution Place 1 drop into both eyes 2 (two) times daily.  01/03/14  Yes Historical Provider, MD  furosemide (LASIX) 20 MG tablet Take 1 tablet (20 mg total) by mouth every other day. 02/12/14  Yes Larey Dresser, MD  hydrALAZINE (APRESOLINE) 100 MG tablet Take 1 tablet (100 mg total) by mouth every 8 (eight) hours. 12/18/13  Yes Ripudeep Krystal Eaton, MD  potassium chloride (K-DUR,KLOR-CON) 10 MEQ tablet Take 1 tablet (10 mEq total) by mouth every other day. 02/12/14  Yes Larey Dresser, MD  nitroGLYCERIN (NITROSTAT) 0.4 MG SL tablet Place 1 tablet (0.4 mg total) under the tongue every 5 (five) minutes as needed for chest pain. 12/21/13   Geradine Girt, DO    Allergies:  No Known Allergies  Social History:  Ambulatory   cane,   Lives at home  With family     reports that he has never smoked. He has never used smokeless tobacco. He reports that he drinks about .6 ounces of alcohol per week. He reports that he does not use illicit drugs.    Family History: family history includes CVA in his mother; Hypertension in his brother, brother, and sister.    Physical Exam: Patient Vitals for the past 24 hrs:  BP Temp Temp src Pulse Resp SpO2  03/11/14 0400 214/102 mmHg - - 78 25 95 %  03/11/14 0345 206/100 mmHg - - 80 34 94 %  03/11/14 0330 204/101 mmHg - - 79 24 95 %  03/11/14 0315 211/94 mmHg - - 72 20 97 %  03/11/14 0300 213/112 mmHg - - 71 17 96 %  03/11/14 0245 206/111 mmHg - - 77 18 95 %  03/11/14 0230 211/95 mmHg - - 72 14 94 %  03/11/14 0215 200/92 mmHg - - 71 11 96 %    03/11/14 0200 218/98 mmHg - - 77 18 95 %  03/11/14 0145 205/92 mmHg - - 71 14 94 %  03/10/14 2345 177/94 mmHg - - 82 14 97 %  03/10/14 2335 178/92 mmHg 97.7 F (36.5 C) Oral 91 22 99 %    1. General:  in No Acute distress 2. Psychological: Alert and    Oriented 3. Head/ENT:   Moist  Mucous Membranes                          Head Non traumatic, neck supple                          Normal  Dentition 4. SKIN: decreased Skin turgor,  Skin clean Dry and intact no rash 5. Heart: Regular rate and rhythm no Murmur, Rub or gallop 6. Lungs:  , no wheezes rare mild crackles   7. Abdomen: Soft, non-tender, Non distended 8. Lower extremities: no clubbing, cyanosis, or edema 9. Neurologically strength 5/5 in all 4 ext. CN 2-12 intact 10. MSK: Normal range of motion  body mass index is unknown because there is no weight on file.   Labs on Admission:   Recent Labs  03/11/14 0051  NA 139  K 4.5  CL 100  CO2 21  GLUCOSE 102*  BUN 17  CREATININE 1.39*  CALCIUM 8.6   No results found for this basename: AST, ALT, ALKPHOS, BILITOT, PROT, ALBUMIN,  in the last 72 hours No results found for this basename: LIPASE, AMYLASE,  in the last 72 hours  Recent Labs  03/11/14 0051  WBC 5.8  NEUTROABS 4.0  HGB 15.3  HCT 46.1  MCV 82.6  PLT 164    Recent Labs  03/11/14 0051  TROPONINI <0.30   No results found for this basename: TSH, T4TOTAL, FREET3, T3FREE, THYROIDAB,  in the last 72 hours No results found for this basename: VITAMINB12, FOLATE, FERRITIN, TIBC, IRON, RETICCTPCT,  in the last 72 hours Lab Results  Component Value Date   HGBA1C 6.0* 07/27/2012    The CrCl is unknown because both a height and weight (above a minimum accepted value) are required for this calculation. ABG   Other results:  I have pearsonaly reviewed this: ECG REPORT  Rate:95  Rhythm: Sinus rhythm occasional PVC ST&T Change: no ischemia  BNP (last 3 results)  Recent Labs  12/29/13 1143  01/22/14 1621 02/08/14 1428  PROBNP 57.0 848.2* 73.0    There were no vitals filed for this visit.   Cultures: No results found for this basename: sdes, specrequest, cult, reptstatus       Radiological Exams on Admission: Dg Chest Portable 1 View  03/11/2014   CLINICAL DATA:  Shortness of breath and dizziness.  EXAM: PORTABLE CHEST - 1 VIEW  COMPARISON:  Chest radiograph February 23, 2014  FINDINGS: The cardiac silhouette is upper limits of normal, mediastinal silhouette is nonsuspicious, tortuous aorta. No pleural effusions or focal consolidations. No pneumothorax. Mild broad thoracic dextroscoliosis and degenerative change. Multiple EKG lines overlie the patient and may obscure subtle underlying pathology.  IMPRESSION: Borderline cardiomegaly, no acute pulmonary process.   Electronically Signed   By: Elon Alas   On: 03/11/2014 00:51    Chart has been reviewed  Assessment/Plan  79 yo M with  hx of noncompliance, CKD and hx of SVT with negative Cardiolite from June 2015 presents with elevated blood pressure up to 223/101 and chest pain aftr not taking his medications.  Currently chest pain free  Present on Admission:  . Hypertensive urgency/ poorly controlled HTN due to medical noncompliance restrt homemeds, hydralazine PRN will continue to slowly lower blood pressure.  . CKD (chronic kidney disease) stage 3, GFR 30-59 ml/min - stable continue to monitor . Chest pain - atypical but given risk factors will admit, monitor on telemetry, cycle cardiac enzymes, obtain serial ECG. Further risk stratify with  hgA1C, obtain TSH. Make sure patient is on Aspirin. Further treatment based on the currently pending results.  . Chronic diastolic CHF (congestive heart failure) - stable EF on recent echo . Alcohol abuse - denies withdrawal, CIWA ordered   Prophylaxis: Lovenox, Protonix  CODE STATUS:  FULL  CODE   Other plan as per orders.  I have spent a total of 55 min on this  admission  Darrnell Mangiaracina 03/11/2014, 4:17 AM  [facility]  Pager [phone]   If 7AM-7PM, please contact the day team taking care of the patient  [email or web address]  Password [email or web address]

## 2014-03-11 NOTE — ED Notes (Signed)
Report given to unit

## 2014-03-11 NOTE — ED Notes (Signed)
Report attempted to unit.

## 2014-03-11 NOTE — ED Notes (Signed)
MD at bedside. 

## 2014-03-11 NOTE — ED Provider Notes (Signed)
CSN: [id number]     Arrival date & time 03/10/14  2330 History   First MD Initiated Contact with Patient 03/10/14 2357     Chief complaint: Chest pain  (Consider location/radiation/quality/duration/timing/severity/associated sxs/prior Treatment) The history is provided by the patient.   79 year old male comes in because of ongoing problems with chest pain and dizziness. Been seen in the ED 2 weeks ago and states symptoms never improved. Pain is located across anterior chest and he describes it as a dull pain and rates it at 10/10. Nothing makes it better nothing makes it worse. He called for EMS who gave him aspirin and nitroglycerin but did not affect the pain at all. He denies dyspnea, nausea, diaphoresis.  Past Medical History  Diagnosis Date  . Poor circulation   . Hernia     umbilical  . Gout   . Glaucoma   . Hypertension     does not see a cardiologist  . Arthritis   . Chronic kidney disease   . Headache(784.0)     " WHEN MY BLOOD PRESSURE IS UP"  . Other and unspecified hyperlipidemia   . OA (osteoarthritis)   . CAD (coronary artery disease)     nonobstructive  . History of acute renal failure   . Blindness of left eye   . CKD (chronic kidney disease) stage 3, GFR 30-59 ml/min 07/26/2012  . CVA (cerebral infarction) 07/28/2012   Past Surgical History  Procedure Laterality Date  . Hernia repair    . Ventral hernia repair  03/07/2012    Procedure: LAPAROSCOPIC VENTRAL HERNIA;  Surgeon: Gwenyth Ober, MD;  Location: [facility];  Service: General;  Laterality: N/A;  . Appendectomy    . Cataract extraction extracapsular Right 05/17/2013    Procedure: CATARACT EXTRACTION EXTRACAPSULAR WITH INTRAOCULAR LENS PLACEMENT ([facility]) RIGHT EYE;  Surgeon: Marylynn Pearson, MD;  Location: [facility];  Service: Ophthalmology;  Laterality: Right;  . Cardiac catheterization  11/2003    EF 50-55%  . Cardiac catheterization  08/2009   Family History  Problem Relation Age of Onset  . CVA Mother   .  Hypertension Sister   . Hypertension Brother   . Hypertension Brother    History  Substance Use Topics  . Smoking status: Never Smoker   . Smokeless tobacco: Never Used  . Alcohol Use: 0.6 oz/week    1 Cans of beer per week     Comment: occassional, former heavy drinker    Review of Systems  All other systems reviewed and are negative.     Allergies  Review of patient's allergies indicates no known allergies.  Home Medications   Prior to Admission medications   Medication Sig Start Date End Date Taking? Authorizing Provider  allopurinol (ZYLOPRIM) 100 MG tablet Take 100 mg by mouth daily.    Historical Provider, MD  aspirin EC 81 MG tablet Take 81 mg by mouth daily.    Historical Provider, MD  carvedilol (COREG) 6.25 MG tablet Take 18.75 mg by mouth daily.     Historical Provider, MD  colchicine 0.6 MG tablet Take 0.6 mg by mouth 2 (two) times daily.    Historical Provider, MD  dorzolamide (TRUSOPT) 2 % ophthalmic solution Place 1 drop into both eyes 2 (two) times daily.  01/03/14   Historical Provider, MD  furosemide (LASIX) 20 MG tablet Take 1 tablet (20 mg total) by mouth every other day. 02/12/14   Larey Dresser, MD  hydrALAZINE (APRESOLINE) 100 MG tablet Take 1 tablet (  100 mg total) by mouth every 8 (eight) hours. 12/18/13   Ripudeep Krystal Eaton, MD  nitroGLYCERIN (NITROSTAT) 0.4 MG SL tablet Place 1 tablet (0.4 mg total) under the tongue every 5 (five) minutes as needed for chest pain. 12/21/13   Geradine Girt, DO  potassium chloride (K-DUR,KLOR-CON) 10 MEQ tablet Take 1 tablet (10 mEq total) by mouth every other day. 02/12/14   Larey Dresser, MD   BP 177/94  Pulse 82  Temp(Src) 97.7 F (36.5 C) (Oral)  Resp 14  SpO2 97% Physical Exam  Nursing note and vitals reviewed.  79 year old male, resting comfortably and in no acute distress. Vital signs are significant for hypertension with blood pressure 177/94. Oxygen saturation is 97%, which is normal. Head is normocephalic and  atraumatic. PERRLA, EOMI. Oropharynx is clear. Fundi show no hemorrhage, exudate, or papilledema. Neck is nontender and supple without adenopathy or JVD. Back is nontender and there is no CVA tenderness. Lungs are clear without rales, wheezes, or rhonchi. Chest is nontender. Heart has regular rate and rhythm without murmur. Abdomen is soft, flat, nontender without masses or hepatosplenomegaly and peristalsis is normoactive. Extremities have no cyanosis or edema, full range of motion is present. Skin is warm and dry without rash. Neurologic: Mental status is normal, cranial nerves are intact, there are no motor or sensory deficits.  ED Course  Procedures (including critical care time) Labs Review Results for orders placed during the hospital encounter of 03/10/14  CBC WITH DIFFERENTIAL      Result Value Ref Range   WBC 5.8  4.0 - 10.5 K/uL   RBC 5.58  4.22 - 5.81 MIL/uL   Hemoglobin 15.3  13.0 - 17.0 g/dL   HCT 46.1  39.0 - 52.0 %   MCV 82.6  78.0 - 100.0 fL   MCH 27.4  26.0 - 34.0 pg   MCHC 33.2  30.0 - 36.0 g/dL   RDW 17.7 (*) 11.5 - 15.5 %   Platelets 164  150 - 400 K/uL   Neutrophils Relative % 70  43 - 77 %   Neutro Abs 4.0  1.7 - 7.7 K/uL   Lymphocytes Relative 18  12 - 46 %   Lymphs Abs 1.0  0.7 - 4.0 K/uL   Monocytes Relative 11  3 - 12 %   Monocytes Absolute 0.6  0.1 - 1.0 K/uL   Eosinophils Relative 1  0 - 5 %   Eosinophils Absolute 0.1  0.0 - 0.7 K/uL   Basophils Relative 0  0 - 1 %   Basophils Absolute 0.0  0.0 - 0.1 K/uL  BASIC METABOLIC PANEL      Result Value Ref Range   Sodium 139  137 - 147 mEq/L   Potassium 4.5  3.7 - 5.3 mEq/L   Chloride 100  96 - 112 mEq/L   CO2 21  19 - 32 mEq/L   Glucose, Bld 102 (*) 70 - 99 mg/dL   BUN 17  6 - 23 mg/dL   Creatinine, Ser 1.39 (*) 0.50 - 1.35 mg/dL   Calcium 8.6  8.4 - 10.5 mg/dL   GFR calc non Af Amer 48 (*) >90 mL/min   GFR calc Af Amer 55 (*) >90 mL/min   Anion gap 18 (*) 5 - 15  TROPONIN I      Result Value  Ref Range   Troponin I <0.30  <0.30 ng/mL   Imaging Review Dg Chest Portable 1 View  03/11/2014  CLINICAL DATA:  Shortness of breath and dizziness.  EXAM: PORTABLE CHEST - 1 VIEW  COMPARISON:  Chest radiograph February 23, 2014  FINDINGS: The cardiac silhouette is upper limits of normal, mediastinal silhouette is nonsuspicious, tortuous aorta. No pleural effusions or focal consolidations. No pneumothorax. Mild broad thoracic dextroscoliosis and degenerative change. Multiple EKG lines overlie the patient and may obscure subtle underlying pathology.  IMPRESSION: Borderline cardiomegaly, no acute pulmonary process.   Electronically Signed   By: Elon Alas   On: 03/11/2014 00:51    Date: 03/11/2014  Rate: 95  Rhythm: normal sinus rhythm  QRS Axis: normal  Intervals: PR prolonged and QT prolonged  ST/T Wave abnormalities: normal  Conduction Disutrbances:first-degree A-V block   Narrative Interpretation: Sinus rhythm with PVCs, first degree AV block, prolonged QT interval, nonspecific T wave changes. When compared with ECG of 02/23/2014, QT interval has shortened, ST and T changes have improved.  Old EKG Reviewed: changes noted   MDM   Final diagnoses:  Chest pain, unspecified  Severe hypertension  Dizziness  Renal insufficiency    Chest pain which is unlikely to be cardiac. Uncontrolled hypertension. Ongoing difficulty with dizziness. ECGs in the past have shown some variability in ST and T changes there no acute changes today. Troponin is normal. He'll be given morphine for pain, but he will need additional inpatient evaluation.  Patient finally achieved adequate pain relief with Dr. or 12 mg of morphine. Blood pressure continues to be uncontrolled and he is given labetalol for blood pressure control. Case is discussed with Dr. Roel Cluck [facility] who agrees to admit the patient.    Delora Fuel, MD [phone]

## 2014-03-11 NOTE — ED Notes (Signed)
Spoke with admitting MD about second administration of Labetalol. Hold at this time. She is ordering patients home medications to be administered.

## 2014-03-12 MED ORDER — CLONIDINE HCL 0.1 MG PO TABS: 0.1000 mg | ORAL_TABLET | Freq: Two times a day (BID) | ORAL | Status: DC

## 2014-03-12 MED ORDER — AMLODIPINE BESYLATE 10 MG PO TABS: 10.0000 mg | ORAL_TABLET | Freq: Every day | ORAL | Status: DC

## 2014-03-12 MED ORDER — ISOSORBIDE MONONITRATE ER 30 MG PO TB24: 30.0000 mg | ORAL_TABLET | Freq: Every day | ORAL | Status: DC

## 2014-03-12 NOTE — Care Management Note (Signed)
    Page 1 of 1   03/12/2014     12:52:48 PM CARE MANAGEMENT NOTE 03/12/2014  Patient:  Trevor Murphy, Trevor Murphy   Account Number:  [id number]  Date Initiated:  03/12/2014  Documentation initiated by:  GRAVES-BIGELOW,Wyllow Seigler  Subjective/Objective Assessment:   Pt admitted for hypertensive urgency. Plan for d/c today.     Action/Plan:   Pt currently active with [facility] and they are aware that pt d/c today.   Anticipated DC Date:  03/12/2014   Anticipated DC Plan:  [place]  CM consult      [facility] Choice  Resumption Of Svcs/PTA Provider   Choice offered to / List presented to:  C-1 Patient           Status of service:  Completed, signed off Medicare Important Message given?  NA - LOS <3 / Initial given by admissions (If response is "NO", the following Medicare IM given date fields will be blank) Date Medicare IM given:   Medicare IM given by:   Date Additional Medicare IM given:   Additional Medicare IM given by:    Discharge Disposition:  [place]  Per UR Regulation:  Reviewed for med. necessity/level of care/duration of stay  If discussed at [place] of Stay Meetings, dates discussed:    Comments:

## 2014-03-12 NOTE — Care Management (Signed)
1248 03-12-14 Pt was currently active with [facility] for [facility] services. Pt will need resumption orders and face to face completed. [facility] aware that pt d/c today. No further needs from CM at this time. Bethena Roys, RN,BSN [phone]

## 2014-03-12 NOTE — Progress Notes (Signed)
UR Completed Candelaria Pies Graves-Bigelow, RN,BSN [phone]  

## 2014-03-12 NOTE — Discharge Instructions (Signed)
Follow with Primary MD Maximino Greenland, MD in 7 days   Get CBC, CMP, 2 view Chest X ray checked  by Primary MD next visit.    Activity: As tolerated with Full fall precautions use walker/cane & assistance as needed   Disposition Home     Diet: Heart Healthy    For Heart failure patients - Check your Weight same time everyday, if you gain over 2 pounds, or you develop in leg swelling, experience more shortness of breath or chest pain, call your Primary MD immediately. Follow Cardiac Low Salt Diet and 1.8 lit/day fluid restriction.   On your next visit with her primary care physician please Get Medicines reviewed and adjusted.  Please request your Prim.MD to go over all Hospital Tests and Procedure/Radiological results at the follow up, please get all Hospital records sent to your Prim MD by signing hospital release before you go home.   If you experience worsening of your admission symptoms, develop shortness of breath, life threatening emergency, suicidal or homicidal thoughts you must seek medical attention immediately by calling 911 or calling your MD immediately  if symptoms less severe.  You Must read complete instructions/literature along with all the possible adverse reactions/side effects for all the Medicines you take and that have been prescribed to you. Take any new Medicines after you have completely understood and accpet all the possible adverse reactions/side effects.   Do not drive, operating heavy machinery, perform activities at heights, swimming or participation in water activities or provide baby sitting services if your were admitted for syncope or siezures until you have seen by Primary MD or a Neurologist and advised to do so again.  Do not drive when taking Pain medications.    Do not take more than prescribed Pain, Sleep and Anxiety Medications  Special Instructions: If you have smoked or chewed Tobacco  in the last 2 yrs please stop smoking, stop any regular  Alcohol  and or any Recreational drug use.  Wear Seat belts while driving.   Please note  You were cared for by a hospitalist during your hospital stay. If you have any questions about your discharge medications or the care you received while you were in the hospital after you are discharged, you can call the unit and asked to speak with the hospitalist on call if the hospitalist that took care of you is not available. Once you are discharged, your primary care physician will handle any further medical issues. Please note that NO REFILLS for any discharge medications will be authorized once you are discharged, as it is imperative that you return to your primary care physician (or establish a relationship with a primary care physician if you do not have one) for your aftercare needs so that they can reassess your need for medications and monitor your lab values.

## 2014-03-12 NOTE — Discharge Summary (Signed)
Trevor Murphy, is a 79 y.o. male  DOB 11-20-1934  MRN [id number].  Admission date:  03/10/2014  Admitting Physician  Toy Baker, MD  Discharge Date:  03/12/2014   Primary MD  Maximino Greenland, MD  Recommendations for primary care physician for things to follow:   Blood pressure closely adjust medications as needed or monitor and treat secondary factors for CAD/CVA and PAD.   Admission Diagnosis  Chest pain, unspecified [786.50] Dizziness [780.4] Alcohol abuse [305.00] Renal insufficiency [593.9] Hypertensive urgency [401.9] Severe hypertension [401.9]   Discharge Diagnosis  Chest pain, unspecified [786.50] Dizziness [780.4] Alcohol abuse [305.00] Renal insufficiency [593.9] Hypertensive urgency [401.9] Severe hypertension [401.9]  Hypertensive crisis/urgency with atypical noncardiac chest pain recent negative stress test 2 months ago  Active Problems:   CKD (chronic kidney disease) stage 3, GFR 30-59 ml/min   Hypertensive crisis   Chest pain   Alcohol abuse   Chronic diastolic CHF (congestive heart failure)   Hypertensive urgency      Past Medical History  Diagnosis Date  . Poor circulation   . Hernia     umbilical  . Gout   . Glaucoma   . Hypertension     does not see a cardiologist  . Arthritis   . Chronic kidney disease   . Headache(784.0)     " WHEN MY BLOOD PRESSURE IS UP"  . Other and unspecified hyperlipidemia   . OA (osteoarthritis)   . CAD (coronary artery disease)     nonobstructive  . History of acute renal failure   . Blindness of left eye   . CKD (chronic kidney disease) stage 3, GFR 30-59 ml/min 07/26/2012  . CVA (cerebral infarction) 07/28/2012    Past Surgical History  Procedure Laterality Date  . Hernia repair    . Ventral hernia repair  03/07/2012    Procedure: LAPAROSCOPIC  VENTRAL HERNIA;  Surgeon: Gwenyth Ober, MD;  Location: [facility];  Service: General;  Laterality: N/A;  . Appendectomy    . Cataract extraction extracapsular Right 05/17/2013    Procedure: CATARACT EXTRACTION EXTRACAPSULAR WITH INTRAOCULAR LENS PLACEMENT (IOC) RIGHT EYE;  Surgeon: Marylynn Pearson, MD;  Location: [facility];  Service: Ophthalmology;  Laterality: Right;  . Cardiac catheterization  11/2003    EF 50-55%  . Cardiac catheterization  08/2009       History of present illness and  Hospital Course:     Kindly see H&P for history of present illness and admission details, please review complete Labs, Consult reports and Test reports for all details in brief  HPI  from the history and physical done on the day of admission   Trevor Murphy is a 79 y.o. Male has a past medical history of Poor circulation; Hernia; Gout; Glaucoma; Hypertension; Arthritis; Chronic kidney disease; Headache(784.0); Other and unspecified hyperlipidemia; OA (osteoarthritis); CAD (coronary artery disease); History of acute renal failure; Blindness of left eye; CKD (chronic kidney disease) stage 3, GFR 30-59 ml/min (07/26/2012); and CVA (cerebral infarction) (07/28/2012).   With hx  of noncompliance and previous admission hypertensive urgency complicated by SVT and troponin leak. With negative Cardiolite from June 2015 presents with elevated blood pressure up to 223/101 and chest pain.   Patient states he forgot to take his medications yesterday. Had chest ain in the center of his chest for 4 hours some shortness of breath. Reports feeling light headed when he stands up. Have ben started on lasix since June.   Of note on 7/17 he was evaluated in ER for dizziness and had unremarkable work up including MRI of the brain.  Hospitalist was called for admission for chest Pain    Hospital Course    1. Hypertensive crisis causing atypical noncardiac chest pain and headache with dizziness. Due to medication noncompliance, counseled  on the need for compliance with medications, blood pressure medications and just to good effect, blood pressure stable and he is completely symptom free. EKG was nonacute, 3 sets of troponin negative, he had a negative stress test 2 months ago, head CT unremarkable this admission except for chronic changes we'll request PCP to please monitor and treat secondary factors for CAD, CVA and PAD.    2. Diastolic CHF. EF around 60% on recent echo gram. Compensated.    3. CK D. stage III creatinine at baseline.    4. History of intermittent alcohol abuse. Denies regular alcohol use, counseled to abstain from regular use. No signs of DTs.     Discharge Condition: stable   Follow UP  Follow-up Information   Follow up with SANDERS,ROBYN N, MD. Schedule an appointment as soon as possible for a visit in 1 week.   Specialty:  Internal Medicine   Contact information:   [address] [place] [address] [phone]         Discharge Instructions  and  Discharge Medications     Discharge Instructions   Diet - low sodium heart healthy    Complete by:  As directed      Discharge instructions    Complete by:  As directed   Follow with Primary MD Maximino Greenland, MD in 7 days   Get CBC, CMP, 2 view Chest X ray checked  by Primary MD next visit.    Activity: As tolerated with Full fall precautions use walker/cane & assistance as needed   Disposition Home     Diet: Heart Healthy    For Heart failure patients - Check your Weight same time everyday, if you gain over 2 pounds, or you develop in leg swelling, experience more shortness of breath or chest pain, call your Primary MD immediately. Follow Cardiac Low Salt Diet and 1.8 lit/day fluid restriction.   On your next visit with her primary care physician please Get Medicines reviewed and adjusted.  Please request your Prim.MD to go over all Hospital Tests and Procedure/Radiological results at the follow up, please  get all Hospital records sent to your Prim MD by signing hospital release before you go home.   If you experience worsening of your admission symptoms, develop shortness of breath, life threatening emergency, suicidal or homicidal thoughts you must seek medical attention immediately by calling 911 or calling your MD immediately  if symptoms less severe.  You Must read complete instructions/literature along with all the possible adverse reactions/side effects for all the Medicines you take and that have been prescribed to you. Take any new Medicines after you have completely understood and accpet all the possible adverse reactions/side effects.   Do not drive, operating  heavy machinery, perform activities at heights, swimming or participation in water activities or provide baby sitting services if your were admitted for syncope or siezures until you have seen by Primary MD or a Neurologist and advised to do so again.  Do not drive when taking Pain medications.    Do not take more than prescribed Pain, Sleep and Anxiety Medications  Special Instructions: If you have smoked or chewed Tobacco  in the last 2 yrs please stop smoking, stop any regular Alcohol  and or any Recreational drug use.  Wear Seat belts while driving.   Please note  You were cared for by a hospitalist during your hospital stay. If you have any questions about your discharge medications or the care you received while you were in the hospital after you are discharged, you can call the unit and asked to speak with the hospitalist on call if the hospitalist that took care of you is not available. Once you are discharged, your primary care physician will handle any further medical issues. Please note that NO REFILLS for any discharge medications will be authorized once you are discharged, as it is imperative that you return to your primary care physician (or establish a relationship with a primary care physician if you do not have  one) for your aftercare needs so that they can reassess your need for medications and monitor your lab values.     Increase activity slowly    Complete by:  As directed             Medication List         acetaminophen 500 MG tablet  Commonly known as:  TYLENOL  Take 1,000 mg by mouth every 6 (six) hours as needed for headache.     allopurinol 100 MG tablet  Commonly known as:  ZYLOPRIM  Take 100 mg by mouth daily.     amLODipine 10 MG tablet  Commonly known as:  NORVASC  Take 1 tablet (10 mg total) by mouth daily.     aspirin EC 81 MG tablet  Take 81 mg by mouth daily.     carvedilol 6.25 MG tablet  Commonly known as:  COREG  Take 18.75 mg by mouth daily.     cloNIDine 0.1 MG tablet  Commonly known as:  CATAPRES  Take 1 tablet (0.1 mg total) by mouth 2 (two) times daily.     colchicine 0.6 MG tablet  Take 0.6 mg by mouth 2 (two) times daily.     dorzolamide 2 % ophthalmic solution  Commonly known as:  TRUSOPT  Place 1 drop into both eyes 2 (two) times daily.     furosemide 20 MG tablet  Commonly known as:  LASIX  Take 1 tablet (20 mg total) by mouth every other day.     hydrALAZINE 100 MG tablet  Commonly known as:  APRESOLINE  Take 1 tablet (100 mg total) by mouth every 8 (eight) hours.     isosorbide mononitrate 30 MG 24 hr tablet  Commonly known as:  IMDUR  Take 1 tablet (30 mg total) by mouth daily.     nitroGLYCERIN 0.4 MG SL tablet  Commonly known as:  NITROSTAT  Place 1 tablet (0.4 mg total) under the tongue every 5 (five) minutes as needed for chest pain.     potassium chloride 10 MEQ tablet  Commonly known as:  K-DUR,KLOR-CON  Take 1 tablet (10 mEq total) by mouth every other day.  Diet and Activity recommendation: See Discharge Instructions above   Consults obtained - None   Major procedures and Radiology Reports - PLEASE review detailed and final reports for all details, in brief -      Ct Head Wo Contrast  02/23/2014    CLINICAL DATA:  Dizziness.  EXAM: CT HEAD WITHOUT CONTRAST  TECHNIQUE: Contiguous axial images were obtained from the base of the skull through the vertex without intravenous contrast.  COMPARISON:  Head CT scan 12/18/2013 and 11/27/2012. Brain MRI 12/16/2013.  FINDINGS: Atrophy and chronic microvascular ischemic change are again identified. No evidence of acute intracranial abnormality including infarct, hemorrhage, mass lesion, mass effect, midline shift or abnormal extra-axial fluid collection is seen. There is no hydrocephalus or pneumocephalus. Extensive mucosal thickening right sphenoid sinus is identified. The calvarium is intact.  IMPRESSION: No acute intracranial abnormality.  Atrophy and chronic microvascular ischemic change.  Marked mucosal thickening right sphenoid sinus.   Electronically Signed   By: Inge Rise M.D.   On: 02/23/2014 07:10   Mr Jodene Nam Head Wo Contrast  02/23/2014   CLINICAL DATA:  Dizziness since last night.  Headache.  EXAM: MRI HEAD WITHOUT CONTRAST  MRA HEAD WITHOUT CONTRAST  TECHNIQUE: Multiplanar, multiecho pulse sequences of the brain and surrounding structures were obtained without intravenous contrast. Angiographic images of the head were obtained using MRA technique without contrast.  COMPARISON:  CT head 02/23/2014.  MRI brain 12/16/2013.  FINDINGS: MRI HEAD FINDINGS  The diffusion-weighted images demonstrate no evidence for acute or subacute infarction. No hemorrhage or mass lesion is present. Moderate atrophy and generalized white matter changes are evident bilaterally. The ventricles are proportionate to the degree of atrophy. Remote lacunar infarcts are again noted within the right cerebellum and pons. Remote lacunar infarcts are present in the thalami bilaterally.  Flow is present in the major intracranial arteries. The globes and orbits are intact.  The lateral recess of the right sphenoid sinus is opacified. The previously seen nodule is somewhat obscured on  today's study. Circumferential mucosal thickening is noted in the right greater left sphenoid sinus. There is scattered mucosal thickening throughout the ethmoid air cells and in the frontal sinuses bilaterally. Minimal mucosal disease is noted in the maxillary sinuses. The mastoid air cells are clear  MRA HEAD FINDINGS  The internal carotid arteries are within normal limits from the high cervical segments through the ICA termini bilaterally. The right A1 segment is aplastic. The anterior communicating artery is patent. The MCA bifurcations are intact. There is moderate attenuation of ACA and MCA branch vessels without a significant proximal stenosis or occlusion.  Both A2 segments fill the left vertebral artery is slightly dominant to the right. The PICA origins are visualized and normal. The left vertebral art scratch the posterior cerebral arteries are of fetal type with prominent posterior communicating arteries bilaterally. A left P1 segment is noted. The basilar artery is intact. There is moderate attenuation of distal PCA branch vessels.  IMPRESSION: 1. No acute intracranial abnormality. 2. Stable atrophy and diffuse white matter disease. 3. Remote lacunar infarcts in the basal ganglia, right pons, and cerebellum. 4. Increased sinus disease as described. 5. Moderate median small vessel disease without significant proximal stenosis, aneurysm, or branch vessel occlusion.   Electronically Signed   By: Lawrence Santiago M.D.   On: 02/23/2014 09:24   Mr Brain Wo Contrast  02/23/2014   CLINICAL DATA:  Dizziness since last night.  Headache.  EXAM: MRI HEAD WITHOUT CONTRAST  MRA HEAD  WITHOUT CONTRAST  TECHNIQUE: Multiplanar, multiecho pulse sequences of the brain and surrounding structures were obtained without intravenous contrast. Angiographic images of the head were obtained using MRA technique without contrast.  COMPARISON:  CT head 02/23/2014.  MRI brain 12/16/2013.  FINDINGS: MRI HEAD FINDINGS  The  diffusion-weighted images demonstrate no evidence for acute or subacute infarction. No hemorrhage or mass lesion is present. Moderate atrophy and generalized white matter changes are evident bilaterally. The ventricles are proportionate to the degree of atrophy. Remote lacunar infarcts are again noted within the right cerebellum and pons. Remote lacunar infarcts are present in the thalami bilaterally.  Flow is present in the major intracranial arteries. The globes and orbits are intact.  The lateral recess of the right sphenoid sinus is opacified. The previously seen nodule is somewhat obscured on today's study. Circumferential mucosal thickening is noted in the right greater left sphenoid sinus. There is scattered mucosal thickening throughout the ethmoid air cells and in the frontal sinuses bilaterally. Minimal mucosal disease is noted in the maxillary sinuses. The mastoid air cells are clear  MRA HEAD FINDINGS  The internal carotid arteries are within normal limits from the high cervical segments through the ICA termini bilaterally. The right A1 segment is aplastic. The anterior communicating artery is patent. The MCA bifurcations are intact. There is moderate attenuation of ACA and MCA branch vessels without a significant proximal stenosis or occlusion.  Both A2 segments fill the left vertebral artery is slightly dominant to the right. The PICA origins are visualized and normal. The left vertebral art scratch the posterior cerebral arteries are of fetal type with prominent posterior communicating arteries bilaterally. A left P1 segment is noted. The basilar artery is intact. There is moderate attenuation of distal PCA branch vessels.  IMPRESSION: 1. No acute intracranial abnormality. 2. Stable atrophy and diffuse white matter disease. 3. Remote lacunar infarcts in the basal ganglia, right pons, and cerebellum. 4. Increased sinus disease as described. 5. Moderate median small vessel disease without significant  proximal stenosis, aneurysm, or branch vessel occlusion.   Electronically Signed   By: Lawrence Santiago M.D.   On: 02/23/2014 09:24   Dg Chest Portable 1 View  03/11/2014   CLINICAL DATA:  Shortness of breath and dizziness.  EXAM: PORTABLE CHEST - 1 VIEW  COMPARISON:  Chest radiograph February 23, 2014  FINDINGS: The cardiac silhouette is upper limits of normal, mediastinal silhouette is nonsuspicious, tortuous aorta. No pleural effusions or focal consolidations. No pneumothorax. Mild broad thoracic dextroscoliosis and degenerative change. Multiple EKG lines overlie the patient and may obscure subtle underlying pathology.  IMPRESSION: Borderline cardiomegaly, no acute pulmonary process.   Electronically Signed   By: Elon Alas   On: 03/11/2014 00:51       Micro Results      No results found for this or any previous visit (from the past 240 hour(s)).     Today   Subjective:   Trevor Murphy today has no headache,no chest abdominal pain,no new weakness tingling or numbness, feels much better wants to go home today   Objective:   Blood pressure 150/88, pulse 68, temperature 97.6 F (36.4 C), temperature source Oral, resp. rate 16, height 5' 10.5" (1.791 m), weight 75.388 kg (166 lb 3.2 oz), SpO2 96.00%.   Intake/Output Summary (Last 24 hours) at 03/12/14 1044 Last data filed at 03/12/14 0500  Gross per 24 hour  Intake      0 ml  Output    725 ml  Net   -725 ml    Exam Awake Alert, Oriented x 3, No new F.N deficits, Normal affect [place].AT,PERRAL Supple Neck,No JVD, No cervical lymphadenopathy appriciated.  Symmetrical Chest wall movement, Good air movement bilaterally, CTAB RRR,No Gallops,Rubs or new Murmurs, No Parasternal Heave +ve B.Sounds, Abd Soft, Non tender, No organomegaly appriciated, No rebound -guarding or rigidity. No Cyanosis, Clubbing or edema, No new Rash or bruise  Data Review   CBC w Diff: Lab Results  Component Value Date   WBC 5.8 03/11/2014   HGB 15.3  03/11/2014   HCT 46.1 03/11/2014   PLT 164 03/11/2014   LYMPHOPCT 18 03/11/2014   MONOPCT 11 03/11/2014   EOSPCT 1 03/11/2014   BASOPCT 0 03/11/2014    CMP: Lab Results  Component Value Date   NA 139 03/11/2014   K 4.5 03/11/2014   CL 100 03/11/2014   CO2 21 03/11/2014   BUN 17 03/11/2014   CREATININE 1.39* 03/11/2014   PROT 6.9 02/23/2014   ALBUMIN 3.4* 02/23/2014   BILITOT 0.3 02/23/2014   ALKPHOS 107 02/23/2014   AST 22 02/23/2014   ALT 17 02/23/2014  .   Total Time in preparing paper work, data evaluation and todays exam - 35 minutes  Thurnell Lose M.D on 03/12/2014 at 10:44 AM  [facility] Office  [phone]   **Disclaimer: This note may have been dictated with voice recognition software. Similar sounding words can inadvertently be transcribed and this note may contain transcription errors which may not have been corrected upon publication of note.**

## 2014-03-16 ENCOUNTER — Ambulatory Visit: Payer: Medicare Other | Admitting: Cardiology

## 2014-03-21 ENCOUNTER — Encounter: Payer: Self-pay | Admitting: Cardiology

## 2014-04-04 ENCOUNTER — Ambulatory Visit: Payer: Medicare Other | Admitting: Cardiology

## 2014-04-04 ENCOUNTER — Telehealth: Payer: Self-pay | Admitting: Cardiology

## 2014-04-04 NOTE — Telephone Encounter (Signed)
New message      Need list of medications, verify diagnosis of dystolic dysfunction and most recent ejection fraction---fax to [phone]

## 2014-04-04 NOTE — Telephone Encounter (Signed)
Called Sherri at [facility] case Freight forwarder. Left a message that I have  Faxed  the last pt office visit on 01/29/14. Sherri to call if have any questions call back if questions.

## 2014-04-11 ENCOUNTER — Observation Stay (HOSPITAL_COMMUNITY)
Admission: EM | Admit: 2014-04-11 | Discharge: 2014-04-12 | Disposition: A | Discharge status: Home / Self Care | Payer: Medicare Other | Attending: Internal Medicine | Admitting: Internal Medicine

## 2014-04-11 ENCOUNTER — Encounter (HOSPITAL_COMMUNITY): Payer: Self-pay | Admitting: Emergency Medicine

## 2014-04-11 DIAGNOSIS — R0989 Other specified symptoms and signs involving the circulatory and respiratory systems: Secondary | ICD-10-CM

## 2014-04-11 DIAGNOSIS — N1832 Chronic kidney disease, stage 3b: Secondary | ICD-10-CM | POA: Diagnosis present

## 2014-04-11 DIAGNOSIS — R519 Headache, unspecified: Secondary | ICD-10-CM

## 2014-04-11 DIAGNOSIS — N183 Chronic kidney disease, stage 3 unspecified: Secondary | ICD-10-CM

## 2014-04-11 DIAGNOSIS — Z9889 Other specified postprocedural states: Secondary | ICD-10-CM | POA: Diagnosis not present

## 2014-04-11 DIAGNOSIS — M109 Gout, unspecified: Secondary | ICD-10-CM | POA: Insufficient documentation

## 2014-04-11 DIAGNOSIS — H5440 Blindness, one eye, unspecified eye: Secondary | ICD-10-CM | POA: Insufficient documentation

## 2014-04-11 DIAGNOSIS — Z79899 Other long term (current) drug therapy: Secondary | ICD-10-CM | POA: Diagnosis not present

## 2014-04-11 DIAGNOSIS — H409 Unspecified glaucoma: Secondary | ICD-10-CM | POA: Insufficient documentation

## 2014-04-11 DIAGNOSIS — R0789 Other chest pain: Secondary | ICD-10-CM | POA: Diagnosis not present

## 2014-04-11 DIAGNOSIS — R079 Chest pain, unspecified: Secondary | ICD-10-CM

## 2014-04-11 DIAGNOSIS — K432 Incisional hernia without obstruction or gangrene: Secondary | ICD-10-CM

## 2014-04-11 DIAGNOSIS — M129 Arthropathy, unspecified: Secondary | ICD-10-CM | POA: Diagnosis not present

## 2014-04-11 DIAGNOSIS — R42 Dizziness and giddiness: Secondary | ICD-10-CM

## 2014-04-11 DIAGNOSIS — R51 Headache: Secondary | ICD-10-CM | POA: Insufficient documentation

## 2014-04-11 DIAGNOSIS — I120 Hypertensive chronic kidney disease with stage 5 chronic kidney disease or end stage renal disease: Secondary | ICD-10-CM | POA: Diagnosis not present

## 2014-04-11 DIAGNOSIS — Z7982 Long term (current) use of aspirin: Secondary | ICD-10-CM | POA: Insufficient documentation

## 2014-04-11 DIAGNOSIS — I169 Hypertensive crisis, unspecified: Secondary | ICD-10-CM

## 2014-04-11 DIAGNOSIS — N19 Unspecified kidney failure: Secondary | ICD-10-CM

## 2014-04-11 DIAGNOSIS — I471 Supraventricular tachycardia, unspecified: Secondary | ICD-10-CM

## 2014-04-11 DIAGNOSIS — I2510 Atherosclerotic heart disease of native coronary artery without angina pectoris: Secondary | ICD-10-CM | POA: Diagnosis not present

## 2014-04-11 DIAGNOSIS — M25561 Pain in right knee: Secondary | ICD-10-CM

## 2014-04-11 DIAGNOSIS — R7989 Other specified abnormal findings of blood chemistry: Secondary | ICD-10-CM

## 2014-04-11 DIAGNOSIS — N179 Acute kidney failure, unspecified: Secondary | ICD-10-CM

## 2014-04-11 DIAGNOSIS — K469 Unspecified abdominal hernia without obstruction or gangrene: Secondary | ICD-10-CM | POA: Diagnosis not present

## 2014-04-11 DIAGNOSIS — Z09 Encounter for follow-up examination after completed treatment for conditions other than malignant neoplasm: Secondary | ICD-10-CM

## 2014-04-11 DIAGNOSIS — I999 Unspecified disorder of circulatory system: Secondary | ICD-10-CM | POA: Diagnosis not present

## 2014-04-11 DIAGNOSIS — M1990 Unspecified osteoarthritis, unspecified site: Secondary | ICD-10-CM | POA: Insufficient documentation

## 2014-04-11 DIAGNOSIS — F1010 Alcohol abuse, uncomplicated: Secondary | ICD-10-CM

## 2014-04-11 DIAGNOSIS — I5032 Chronic diastolic (congestive) heart failure: Secondary | ICD-10-CM

## 2014-04-11 DIAGNOSIS — I160 Hypertensive urgency: Secondary | ICD-10-CM

## 2014-04-11 DIAGNOSIS — Z8673 Personal history of transient ischemic attack (TIA), and cerebral infarction without residual deficits: Secondary | ICD-10-CM | POA: Diagnosis not present

## 2014-04-11 DIAGNOSIS — R778 Other specified abnormalities of plasma proteins: Secondary | ICD-10-CM

## 2014-04-11 DIAGNOSIS — E785 Hyperlipidemia, unspecified: Secondary | ICD-10-CM | POA: Diagnosis not present

## 2014-04-11 DIAGNOSIS — I10 Essential (primary) hypertension: Secondary | ICD-10-CM

## 2014-04-11 DIAGNOSIS — N189 Chronic kidney disease, unspecified: Secondary | ICD-10-CM

## 2014-04-11 LAB — CBC
HCT: 42.9 % (ref 39.0–52.0)
HCT: 43.9 % (ref 39.0–52.0)
Hemoglobin: 14.6 g/dL (ref 13.0–17.0)
Hemoglobin: 14.8 g/dL (ref 13.0–17.0)
MCH: 28 pg (ref 26.0–34.0)
MCH: 27.8 pg (ref 26.0–34.0)
MCHC: 34 g/dL (ref 30.0–36.0)
MCHC: 33.7 g/dL (ref 30.0–36.0)
MCV: 82.3 fL (ref 78.0–100.0)
MCV: 82.5 fL (ref 78.0–100.0)
Platelets: 244 10*3/uL (ref 150–400)
Platelets: 167 10*3/uL (ref 150–400)
RBC: 5.21 MIL/uL (ref 4.22–5.81)
RBC: 5.32 MIL/uL (ref 4.22–5.81)
RDW: 16.3 % — ABNORMAL HIGH (ref 11.5–15.5)
RDW: 16.4 % — ABNORMAL HIGH (ref 11.5–15.5)
WBC: 5.4 10*3/uL (ref 4.0–10.5)
WBC: 4.9 10*3/uL (ref 4.0–10.5)

## 2014-04-11 LAB — TSH: TSH: 1.03 u[IU]/mL (ref 0.350–4.500)

## 2014-04-11 LAB — BASIC METABOLIC PANEL
Anion gap: 12 (ref 5–15)
BUN: 39 mg/dL — ABNORMAL HIGH (ref 6–23)
CO2: 23 mEq/L (ref 19–32)
Calcium: 9 mg/dL (ref 8.4–10.5)
Chloride: 103 mEq/L (ref 96–112)
Creatinine, Ser: 1.99 mg/dL — ABNORMAL HIGH (ref 0.50–1.35)
GFR calc Af Amer: 36 mL/min — ABNORMAL LOW (ref >90)
GFR calc non Af Amer: 31 mL/min — ABNORMAL LOW (ref >90)
Glucose, Bld: 165 mg/dL — ABNORMAL HIGH (ref 70–99)
Potassium: 4.3 mEq/L (ref 3.7–5.3)
Sodium: 138 mEq/L (ref 137–147)

## 2014-04-11 LAB — I-STAT CHEM 8, ED
BUN: 39 mg/dL — ABNORMAL HIGH (ref 6–23)
Calcium, Ion: 1.14 mmol/L (ref 1.13–1.30)
Chloride: 107 mEq/L (ref 96–112)
Creatinine, Ser: 2 mg/dL — ABNORMAL HIGH (ref 0.50–1.35)
Glucose, Bld: 118 mg/dL — ABNORMAL HIGH (ref 70–99)
HCT: 48 % (ref 39.0–52.0)
Hemoglobin: 16.3 g/dL (ref 13.0–17.0)
Potassium: 4.1 mEq/L (ref 3.7–5.3)
Sodium: 139 mEq/L (ref 137–147)
TCO2: 20 mmol/L (ref 0–100)

## 2014-04-11 LAB — LIPID PANEL
Cholesterol: 163 mg/dL (ref 0–200)
HDL: 39 mg/dL — ABNORMAL LOW (ref >39)
LDL Cholesterol: 86 mg/dL (ref 0–99)
Total CHOL/HDL Ratio: 4.2 RATIO
Triglycerides: 192 mg/dL — ABNORMAL HIGH (ref <150)
VLDL: 38 mg/dL (ref 0–40)

## 2014-04-11 LAB — I-STAT TROPONIN, ED: Troponin i, poc: 0.02 ng/mL (ref 0.00–0.08)

## 2014-04-11 LAB — PRO B NATRIURETIC PEPTIDE: Pro B Natriuretic peptide (BNP): 248.8 pg/mL (ref 0–450)

## 2014-04-11 LAB — TROPONIN I: Troponin I: <0.3 ng/mL (ref <0.30)

## 2014-04-11 MED ORDER — COLCHICINE 0.6 MG PO TABS
0.6000 mg | ORAL_TABLET | Freq: Two times a day (BID) | ORAL | Status: DC
  Administered 2014-04-11 – 2014-04-12 (×2): 0.6 mg via ORAL
  Filled 2014-04-11 (×3): qty 1

## 2014-04-11 MED ORDER — ALLOPURINOL 100 MG PO TABS
100.0000 mg | ORAL_TABLET | Freq: Every day | ORAL | Status: DC
  Administered 2014-04-12: 100 mg via ORAL
  Filled 2014-04-11: qty 1

## 2014-04-11 MED ORDER — POTASSIUM CHLORIDE CRYS ER 10 MEQ PO TBCR
10.0000 meq | EXTENDED_RELEASE_TABLET | ORAL | Status: DC
  Administered 2014-04-11: 10 meq via ORAL
  Filled 2014-04-11: qty 1

## 2014-04-11 MED ORDER — ASPIRIN EC 325 MG PO TBEC
325.0000 mg | DELAYED_RELEASE_TABLET | Freq: Every day | ORAL | Status: DC
  Administered 2014-04-12: 325 mg via ORAL
  Filled 2014-04-11: qty 1

## 2014-04-11 MED ORDER — FUROSEMIDE 20 MG PO TABS: 20.0000 mg | ORAL_TABLET | ORAL | Status: DC

## 2014-04-11 MED ORDER — CARVEDILOL 6.25 MG PO TABS
18.7500 mg | ORAL_TABLET | Freq: Every morning | ORAL | Status: DC
  Administered 2014-04-12: 18.75 mg via ORAL
  Filled 2014-04-11: qty 1

## 2014-04-11 MED ORDER — NITROGLYCERIN 2 % TD OINT
0.5000 [in_us] | TOPICAL_OINTMENT | Freq: Once | TRANSDERMAL | Status: AC
  Administered 2014-04-11: 0.5 [in_us] via TOPICAL
  Filled 2014-04-11: qty 1

## 2014-04-11 MED ORDER — ASPIRIN 81 MG PO CHEW
324.0000 mg | CHEWABLE_TABLET | Freq: Once | ORAL | Status: AC
  Administered 2014-04-11: 324 mg via ORAL
  Filled 2014-04-11: qty 4

## 2014-04-11 MED ORDER — NITROGLYCERIN 0.4 MG SL SUBL: 0.4000 mg | SUBLINGUAL_TABLET | SUBLINGUAL | Status: DC | PRN

## 2014-04-11 MED ORDER — ONDANSETRON HCL 4 MG/2ML IJ SOLN
4.0000 mg | Freq: Three times a day (TID) | INTRAMUSCULAR | Status: AC | PRN
Start: 2014-04-11 — End: 2014-04-12

## 2014-04-11 MED ORDER — ACETAMINOPHEN 500 MG PO TABS: 1000.0000 mg | ORAL_TABLET | Freq: Four times a day (QID) | ORAL | Status: DC | PRN

## 2014-04-11 MED ORDER — AMLODIPINE BESYLATE 10 MG PO TABS
10.0000 mg | ORAL_TABLET | Freq: Every day | ORAL | Status: DC
  Administered 2014-04-11 – 2014-04-12 (×2): 10 mg via ORAL
  Filled 2014-04-11 (×2): qty 1

## 2014-04-11 MED ORDER — HYDRALAZINE HCL 50 MG PO TABS
100.0000 mg | ORAL_TABLET | Freq: Three times a day (TID) | ORAL | Status: DC
  Administered 2014-04-11 – 2014-04-12 (×3): 100 mg via ORAL
  Filled 2014-04-11 (×5): qty 2

## 2014-04-11 MED ORDER — MORPHINE SULFATE 2 MG/ML IJ SOLN: 0.5000 mg | INTRAMUSCULAR | Status: DC | PRN

## 2014-04-11 MED ORDER — SODIUM CHLORIDE 0.9 % IV SOLN
INTRAVENOUS | Status: DC
  Administered 2014-04-11 – 2014-04-12 (×2): via INTRAVENOUS

## 2014-04-11 MED ORDER — HEPARIN SODIUM (PORCINE) 5000 UNIT/ML IJ SOLN
5000.0000 [IU] | Freq: Three times a day (TID) | INTRAMUSCULAR | Status: DC
  Administered 2014-04-11 – 2014-04-12 (×2): 5000 [IU] via SUBCUTANEOUS
  Filled 2014-04-11 (×4): qty 1

## 2014-04-11 MED ORDER — CLONIDINE HCL 0.1 MG PO TABS
0.1000 mg | ORAL_TABLET | Freq: Two times a day (BID) | ORAL | Status: DC
  Administered 2014-04-11 – 2014-04-12 (×2): 0.1 mg via ORAL
  Filled 2014-04-11 (×3): qty 1

## 2014-04-11 MED ORDER — ISOSORBIDE MONONITRATE ER 30 MG PO TB24
30.0000 mg | ORAL_TABLET | Freq: Every day | ORAL | Status: DC
  Administered 2014-04-12: 30 mg via ORAL
  Filled 2014-04-11: qty 1

## 2014-04-11 MED ORDER — SODIUM CHLORIDE 0.9 % IJ SOLN
3.0000 mL | Freq: Two times a day (BID) | INTRAMUSCULAR | Status: DC
  Administered 2014-04-11: 3 mL via INTRAVENOUS

## 2014-04-11 NOTE — ED Notes (Addendum)
The patient says he has been hypotensive today.  He complains of a headache and has been nauseated and vomiting.  He said he ate this morning and was able to keep his breakfast down but he feels weak and has some SOB.  The patient rates his headache 6/10.At the end he added that he took a Nitroglycerin because his chest is hurting him.  He is chest pain free.

## 2014-04-11 NOTE — ED Notes (Signed)
Meal tray ordered 

## 2014-04-11 NOTE — H&P (Signed)
Hospitalist Admission History and Physical  Patient name: Trevor Murphy Medical record number: [id number] Date of birth: [birth year] Age: 79 y.o. Gender: male  Primary Care Provider: Maximino Greenland, MD  Chief Complaint: chest pain   History of Present Illness:This is a 79 y.o. year old male with significant past medical history of HTN, diastolic CHF, non obstructive CAD, CVA  presenting with chest pain. Pt states that he had recurrent CP over the course of the day. Also with mild generalized headache and nausea. Was unable to tolerate breakfast earlier today. Had one episode of NBNB vomiting. CP was present both at rest as well as with exertion. Pt took his BP and was around the 0000000 systolic. Pt states that he took a NTG for his CP, which helped relieve his sxs, but his BP worse with pt feeling weak an dizzy. Denies any LOC, syncope, hemiparesis. Pt was directed to go to ER by family. Pt does report being under stress because of his wife recently dying. Patient as noted had a recent nuclear stress test with cardiology within the past 2 months that was normal. Dr. Marigene Ehlers.  On presentation to the ER, hemodynamically stable. Afebrile. Heart rate in the 0000000 to [id number] systolic. Setting greater than 90% on room air. Naming in the upper 90s. CBC and be made within normal limits apart from a creatinine of 1.99 which is patient's baseline. Troponin negative x1. Pro BMP within normal limits. EKG with new T wave inversions in lateral leads as compared to previous EKGs. Did have some mild chest pain on route to the ER. This is resolved with nitro paste.   Assessment and Plan: Trevor Murphy is a 79 y.o. year old male presenting with chest pain   Active Problems:   Chest pain   1-Chest pain  -Fairly typical sxs in pt with known CV risk factors - no active CP s/p nitropaste  -cards c/s pending -full dose ASA -prn NTG -cycle CEs  -risk stratification labs -f/u on cards recs   2-HTN -Hemodynamically  stable  -no LLN BPs earlier today -restart home regimen, titrate as clinically indicated   3-diastolic CHF  -euvolemic on exam  -continue to follow  4-CKD -at baseline -continue to follow   FEN/GI: NPO PMN  Prophylaxis: sub q heparin  Disposition: pending further evaluation  Code Status:Full Code    Patient Active Problem List   Diagnosis Date Noted  . Hypertensive urgency 03/11/2014  . Chronic diastolic CHF (congestive heart failure) 01/30/2014  . Alcohol abuse 01/19/2014  . SVT (supraventricular tachycardia) 12/19/2013  . Elevated troponin 12/19/2013  . Gout attack 12/19/2013  . Knee pain, right 12/19/2013  . Chest pain 12/18/2013  . Dyslipidemia 12/16/2013  . Hypertensive crisis 12/16/2013  . CVA (cerebral infarction) 07/28/2012  . Acute on chronic renal failure 07/27/2012  . Vertigo 07/26/2012  . HTN (hypertension) 07/26/2012  . CKD (chronic kidney disease) stage 3, GFR 30-59 ml/min 07/26/2012  . N&V (nausea and vomiting) 07/26/2012  . Poor circulation   . Recurrent ventral hernia 02/16/2012  . Postop check 03/10/2011  . Hernia (acquired) (recurrent) 02/10/2011   Past Medical History: Past Medical History  Diagnosis Date  . Poor circulation   . Hernia     umbilical  . Gout   . Glaucoma   . Hypertension     does not see a cardiologist  . Arthritis   . Chronic kidney disease   . Headache(784.0)     " WHEN MY BLOOD PRESSURE IS  UP"  . Other and unspecified hyperlipidemia   . OA (osteoarthritis)   . CAD (coronary artery disease)     nonobstructive  . History of acute renal failure   . Blindness of left eye   . CKD (chronic kidney disease) stage 3, GFR 30-59 ml/min 07/26/2012  . CVA (cerebral infarction) 07/28/2012    Past Surgical History: Past Surgical History  Procedure Laterality Date  . Hernia repair    . Ventral hernia repair  03/07/2012    Procedure: LAPAROSCOPIC VENTRAL HERNIA;  Surgeon: Gwenyth Ober, MD;  Location: [place];  Service: General;   Laterality: N/A;  . Appendectomy    . Cataract extraction extracapsular Right 05/17/2013    Procedure: CATARACT EXTRACTION EXTRACAPSULAR WITH INTRAOCULAR LENS PLACEMENT (IOC) RIGHT EYE;  Surgeon: Marylynn Pearson, MD;  Location: [place];  Service: Ophthalmology;  Laterality: Right;  . Cardiac catheterization  11/2003    EF 50-55%  . Cardiac catheterization  08/2009    Social History: History   Social History  . Marital Status: Married    Spouse Name: N/A    Number of Children: N/A  . Years of Education: N/A   Social History Main Topics  . Smoking status: Never Smoker   . Smokeless tobacco: Never Used  . Alcohol Use: 0.6 oz/week    1 Cans of beer per week     Comment: statea pint of liquor last few days.  . Drug Use: No  . Sexual Activity: None   Other Topics Concern  . None   Social History Narrative  . None    Family History: Family History  Problem Relation Age of Onset  . CVA Mother   . Hypertension Sister   . Hypertension Brother   . Hypertension Brother     Allergies: No Known Allergies  Current Facility-Administered Medications  Medication Dose Route Frequency Provider Last Rate Last Dose  . 0.9 %  sodium chloride infusion   Intravenous Continuous Shanda Howells, MD      . acetaminophen (TYLENOL) tablet 1,000 mg  1,000 mg Oral Q6H PRN Shanda Howells, MD      . allopurinol (ZYLOPRIM) tablet 100 mg  100 mg Oral Daily Shanda Howells, MD      . amLODipine (NORVASC) tablet 10 mg  10 mg Oral Daily Shanda Howells, MD      . aspirin EC tablet 325 mg  325 mg Oral Daily Shanda Howells, MD      . Derrill Memo ON 04/12/2014] carvedilol (COREG) tablet 18.75 mg  18.75 mg Oral q morning - 10a Shanda Howells, MD      . cloNIDine (CATAPRES) tablet 0.1 mg  0.1 mg Oral BID Shanda Howells, MD      . colchicine tablet 0.6 mg  0.6 mg Oral BID Shanda Howells, MD      . furosemide (LASIX) tablet 20 mg  20 mg Oral QODAY Shanda Howells, MD      . heparin injection 5,000 Units  5,000 Units Subcutaneous 3  times per day Shanda Howells, MD      . hydrALAZINE (APRESOLINE) tablet 100 mg  100 mg Oral 3 times per day Shanda Howells, MD      . isosorbide mononitrate (IMDUR) 24 hr tablet 30 mg  30 mg Oral Daily Shanda Howells, MD      . morphine 2 MG/ML injection 0.5-1 mg  0.5-1 mg Intravenous Q3H PRN Shanda Howells, MD      . nitroGLYCERIN (NITROSTAT) SL tablet 0.4 mg  0.4 mg  Sublingual Q5 min PRN Shanda Howells, MD      . ondansetron [facility]) injection 4 mg  4 mg Intravenous Q8H PRN Johnna Acosta, MD      . potassium chloride (K-DUR,KLOR-CON) CR tablet 10 mEq  10 mEq Oral QODAY Shanda Howells, MD      . sodium chloride 0.9 % injection 3 mL  3 mL Intravenous Q12H Shanda Howells, MD       Review Of Systems: 12 point ROS negative except as noted above in HPI.  Physical Exam: Filed Vitals:   04/11/14 2051  BP: 127/74  Pulse: 78  Temp: 97.7 F (36.5 C)  Resp: 16    General: alert and cooperative HEENT: PERRLA and extra ocular movement intact Heart: S1, S2 normal, no murmur, rub or gallop, regular rate and rhythm Lungs: clear to auscultation, no wheezes or rales and unlabored breathing Abdomen: abdomen is soft without significant tenderness, masses, organomegaly or guarding Extremities: extremities normal, atraumatic, no cyanosis or edema Skin:no rashes, no ecchymoses Neurology: normal without focal findings  Labs and Imaging: Lab Results  Component Value Date/Time   NA 139 04/11/2014  6:23 PM   K 4.1 04/11/2014  6:23 PM   CL 107 04/11/2014  6:23 PM   CO2 23 04/11/2014  1:46 PM   BUN 39* 04/11/2014  6:23 PM   CREATININE 2.00* 04/11/2014  6:23 PM   GLUCOSE 118* 04/11/2014  6:23 PM   Lab Results  Component Value Date   WBC 4.9 04/11/2014   HGB 16.3 04/11/2014   HCT 48.0 04/11/2014   MCV 82.5 04/11/2014   PLT 167 04/11/2014    No results found.         Shanda Howells MD  Pager: [phone]

## 2014-04-11 NOTE — ED Notes (Signed)
Called main lab. Lab will result blood work that had been sent down earlier.

## 2014-04-11 NOTE — ED Provider Notes (Signed)
CSN: [id number]     Arrival date & time 04/11/14  1334 History   First MD Initiated Contact with Patient 04/11/14 1629     Chief Complaint  Patient presents with  . Chest Pain    The patient says he has been hypotensive today.  He complains of a headache and has been nauseated and vomiting.  At the end he added that he took a Nitroglycerin because his chest is hurting him.  He is chest pain free.     (Consider location/radiation/quality/duration/timing/severity/associated sxs/prior Treatment) HPI Comments: 79 year old male with a history of hypertension, congestive heart failure, minimally obstructive heart disease based on heart catheterization from 2011. He presents with a complaint of a headache which has been going on for several days, is similar to the headache that he had last month when he was admitted to the hospital. He states that this headache has gradually eased off at this time it is mild. It is located over the frontal bones, does not radiate, he when the headache is bad he has changes in his vision in his right eye though at baseline he is blind in his left eye. This afternoon he had transient chest pain and shortness of breath after walking, this has resolved at this time. He denies swelling of the lower extremities and has been on Lasix recently due to fluid overload. He did take a nitroglycerin and the Tylenol prior to arrival with improvement. The family members state that his wife died earlier this week, they think that this is causing some of a headache.  Patient is a 79 y.o. male presenting with chest pain. The history is provided by the patient and medical records.  Chest Pain   Past Medical History  Diagnosis Date  . Poor circulation   . Hernia     umbilical  . Gout   . Glaucoma   . Hypertension     does not see a cardiologist  . Arthritis   . Chronic kidney disease   . Headache(784.0)     " WHEN MY BLOOD PRESSURE IS UP"  . Other and unspecified hyperlipidemia    . OA (osteoarthritis)   . CAD (coronary artery disease)     nonobstructive  . History of acute renal failure   . Blindness of left eye   . CKD (chronic kidney disease) stage 3, GFR 30-59 ml/min 07/26/2012  . CVA (cerebral infarction) 07/28/2012   Past Surgical History  Procedure Laterality Date  . Hernia repair    . Ventral hernia repair  03/07/2012    Procedure: LAPAROSCOPIC VENTRAL HERNIA;  Surgeon: Gwenyth Ober, MD;  Location: [place];  Service: General;  Laterality: N/A;  . Appendectomy    . Cataract extraction extracapsular Right 05/17/2013    Procedure: CATARACT EXTRACTION EXTRACAPSULAR WITH INTRAOCULAR LENS PLACEMENT (IOC) RIGHT EYE;  Surgeon: Marylynn Pearson, MD;  Location: [place];  Service: Ophthalmology;  Laterality: Right;  . Cardiac catheterization  11/2003    EF 50-55%  . Cardiac catheterization  08/2009   Family History  Problem Relation Age of Onset  . CVA Mother   . Hypertension Sister   . Hypertension Brother   . Hypertension Brother    History  Substance Use Topics  . Smoking status: Never Smoker   . Smokeless tobacco: Never Used  . Alcohol Use: 0.6 oz/week    1 Cans of beer per week     Comment: statea pint of liquor last few days.    Review of Systems  Cardiovascular: Positive for chest pain.  All other systems reviewed and are negative.     Allergies  Review of patient's allergies indicates no known allergies.  Home Medications   Prior to Admission medications   Medication Sig Start Date End Date Taking? Authorizing Provider  acetaminophen (TYLENOL) 500 MG tablet Take 1,000 mg by mouth every 6 (six) hours as needed for headache.   Yes Historical Provider, MD  allopurinol (ZYLOPRIM) 100 MG tablet Take 100 mg by mouth daily.   Yes Historical Provider, MD  amLODipine (NORVASC) 10 MG tablet Take 1 tablet (10 mg total) by mouth daily. 03/12/14  Yes Thurnell Lose, MD  aspirin EC 81 MG tablet Take 81 mg by mouth daily.   Yes Historical Provider, MD   carvedilol (COREG) 6.25 MG tablet Take 18.75 mg by mouth every morning.    Yes Historical Provider, MD  cloNIDine (CATAPRES) 0.1 MG tablet Take 1 tablet (0.1 mg total) by mouth 2 (two) times daily. 03/12/14  Yes Thurnell Lose, MD  colchicine 0.6 MG tablet Take 0.6 mg by mouth 2 (two) times daily.   Yes Historical Provider, MD  furosemide (LASIX) 20 MG tablet Take 1 tablet (20 mg total) by mouth every other day. 02/12/14  Yes Larey Dresser, MD  hydrALAZINE (APRESOLINE) 100 MG tablet Take 1 tablet (100 mg total) by mouth every 8 (eight) hours. 12/18/13  Yes Ripudeep Krystal Eaton, MD  isosorbide mononitrate (IMDUR) 30 MG 24 hr tablet Take 1 tablet (30 mg total) by mouth daily. 03/12/14  Yes Thurnell Lose, MD  nitroGLYCERIN (NITROSTAT) 0.4 MG SL tablet Place 1 tablet (0.4 mg total) under the tongue every 5 (five) minutes as needed for chest pain. 12/21/13  Yes Geradine Girt, DO  potassium chloride (K-DUR,KLOR-CON) 10 MEQ tablet Take 1 tablet (10 mEq total) by mouth every other day. 02/12/14  Yes Larey Dresser, MD   BP 134/72  Pulse 62  Temp(Src) 97.5 F (36.4 C) (Oral)  Resp 15  Ht 5\' 10"  (1.778 m)  Wt 164 lb (74.39 kg)  BMI 23.53 kg/m2  SpO2 90% Physical Exam  Nursing note and vitals reviewed. Constitutional: He appears well-developed and well-nourished. No distress.  HENT:  Head: Normocephalic and atraumatic.  Mouth/Throat: Oropharynx is clear and moist. No oropharyngeal exudate.  Eyes: Conjunctivae and EOM are normal. Pupils are equal, round, and reactive to light. Right eye exhibits no discharge. Left eye exhibits no discharge. No scleral icterus.  Neck: Normal range of motion. Neck supple. No JVD present. No thyromegaly present.  Cardiovascular: Normal rate, regular rhythm, normal heart sounds and intact distal pulses.  Exam reveals no gallop and no friction rub.   No murmur heard. Pulmonary/Chest: Effort normal and breath sounds normal. No respiratory distress. He has no wheezes. He has no  rales.  Abdominal: Soft. Bowel sounds are normal. He exhibits no distension and no mass. There is no tenderness.  Musculoskeletal: Normal range of motion. He exhibits no edema and no tenderness.  Lymphadenopathy:    He has no cervical adenopathy.  Neurological: He is alert. Coordination normal.  Neurologic exam:  Speech clear, pupils equal round reactive to light, extraocular movements intact  Normal peripheral visual fields Cranial nerves III through XII normal including no facial droop Follows commands, moves all extremities x4, normal strength to bilateral upper and lower extremities at all major muscle groups including grip Sensation normal to light touch and pinprick Coordination intact, no limb ataxia, finger-nose-finger normal Rapid alternating movements  normal No pronator drift Decreased reflexes at the patellar tendons bilaterally   Skin: Skin is warm and dry. No rash noted. No erythema.  Psychiatric: He has a normal mood and affect. His behavior is normal.    ED Course  Procedures (including critical care time) Labs Review Labs Reviewed  CBC - Abnormal; Notable for the following:    RDW 16.3 (*)    All other components within normal limits  BASIC METABOLIC PANEL - Abnormal; Notable for the following:    Glucose, Bld 165 (*)    BUN 39 (*)    Creatinine, Ser 1.99 (*)    GFR calc non Af Amer 31 (*)    GFR calc Af Amer 36 (*)    All other components within normal limits  CBC - Abnormal; Notable for the following:    RDW 16.4 (*)    All other components within normal limits  I-STAT CHEM 8, ED - Abnormal; Notable for the following:    BUN 39 (*)    Creatinine, Ser 2.00 (*)    Glucose, Bld 118 (*)    All other components within normal limits  PRO B NATRIURETIC PEPTIDE  I-STAT TROPOININ, ED  I-STAT TROPOININ, ED    Imaging Review No results found.   EKG Interpretation   Date/Time:  Wednesday April 11 2014 13:40:45 EDT Ventricular Rate:  74 PR Interval:   210 QRS Duration: 84 QT Interval:  426 QTC Calculation: 472 R Axis:   34 Text Interpretation:  Sinus rhythm with 1st degree A-V block Cannot rule  out Inferior infarct , age undetermined T wave abnormality, consider  lateral ischemia Abnormal ECG Since last tracing T wave inversion  Confirmed by Jannine Abreu  MD, Kiriana Worthington ([id number]) on 04/11/2014 4:30:06 PM      MDM   Final diagnoses:  Other chest pain  Nonintractable headache, unspecified chronicity pattern, unspecified headache type  Renal failure    At this time the patient has notable abnormalities on the EKG including T wave inversions. His headache is easing off and at this time he does except pain medications but states that it is improving. His cardiac exam is without murmurs rubs or gallops and his blood pressure has improved to normal at 135/80 at this time. Troponin initially is negative though I am concerned about his T wave abnormalities which may warrant readmission.  Discussed care with the hospitalist Dr. Ernestina Patches who agrees with admission, holding orders written, will ask cardiology to see the patient due to new T-wave changes.  Medical records suggest that the patient had a negative nuclear stress test 2-1/2 months ago, his renal function has declined and today is 2.0 creatinine from his baseline of 1.4.  Meds given in ED:  Medications  aspirin chewable tablet 324 mg (324 mg Oral Given 04/11/14 1711)  nitroGLYCERIN (NITROGLYN) 2 % ointment 0.5 inch (0.5 inches Topical Given 04/11/14 1914)          Johnna Acosta, MD 04/11/14 [phone]

## 2014-04-11 NOTE — ED Notes (Signed)
Attempted report 

## 2014-04-12 ENCOUNTER — Encounter (HOSPITAL_COMMUNITY): Payer: Self-pay | Admitting: Physician Assistant

## 2014-04-12 DIAGNOSIS — N183 Chronic kidney disease, stage 3 unspecified: Secondary | ICD-10-CM

## 2014-04-12 DIAGNOSIS — R0789 Other chest pain: Secondary | ICD-10-CM | POA: Diagnosis present

## 2014-04-12 DIAGNOSIS — I10 Essential (primary) hypertension: Secondary | ICD-10-CM

## 2014-04-12 LAB — DIFFERENTIAL
Basophils Absolute: 0 K/uL (ref 0.0–0.1)
Basophils Relative: 0 % (ref 0–1)
Eosinophils Absolute: 0.1 K/uL (ref 0.0–0.7)
Eosinophils Relative: 3 % (ref 0–5)
Lymphocytes Relative: 37 % (ref 12–46)
Lymphs Abs: 1.4 K/uL (ref 0.7–4.0)
Monocytes Absolute: 0.3 K/uL (ref 0.1–1.0)
Monocytes Relative: 9 % (ref 3–12)
Neutro Abs: 1.9 K/uL (ref 1.7–7.7)
Neutrophils Relative %: 51 % (ref 43–77)

## 2014-04-12 LAB — CBC
HCT: 37.3 % — ABNORMAL LOW (ref 39.0–52.0)
Hemoglobin: 12.6 g/dL — ABNORMAL LOW (ref 13.0–17.0)
MCH: 27.3 pg (ref 26.0–34.0)
MCHC: 33.8 g/dL (ref 30.0–36.0)
MCV: 80.9 fL (ref 78.0–100.0)
Platelets: 187 10*3/uL (ref 150–400)
RBC: 4.61 MIL/uL (ref 4.22–5.81)
RDW: 16.3 % — ABNORMAL HIGH (ref 11.5–15.5)
WBC: 3.7 10*3/uL — ABNORMAL LOW (ref 4.0–10.5)

## 2014-04-12 LAB — COMPREHENSIVE METABOLIC PANEL
ALT: 8 U/L (ref 0–53)
AST: 15 U/L (ref 0–37)
Albumin: 3.2 g/dL — ABNORMAL LOW (ref 3.5–5.2)
Alkaline Phosphatase: 74 U/L (ref 39–117)
Anion gap: 12 (ref 5–15)
BUN: 38 mg/dL — ABNORMAL HIGH (ref 6–23)
CO2: 24 mEq/L (ref 19–32)
Calcium: 8.3 mg/dL — ABNORMAL LOW (ref 8.4–10.5)
Chloride: 106 mEq/L (ref 96–112)
Creatinine, Ser: 1.98 mg/dL — ABNORMAL HIGH (ref 0.50–1.35)
GFR calc Af Amer: 36 mL/min — ABNORMAL LOW (ref >90)
GFR calc non Af Amer: 31 mL/min — ABNORMAL LOW (ref >90)
Glucose, Bld: 93 mg/dL (ref 70–99)
Potassium: 4.4 mEq/L (ref 3.7–5.3)
Sodium: 142 mEq/L (ref 137–147)
Total Bilirubin: 0.2 mg/dL — ABNORMAL LOW (ref 0.3–1.2)
Total Protein: 6.6 g/dL (ref 6.0–8.3)

## 2014-04-12 LAB — TROPONIN I
Troponin I: <0.3 ng/mL (ref <0.30)
Troponin I: <0.3 ng/mL (ref <0.30)

## 2014-04-12 LAB — HEMOGLOBIN A1C
Hgb A1c MFr Bld: 6 % — ABNORMAL HIGH (ref <5.7)
Mean Plasma Glucose: 126 mg/dL — ABNORMAL HIGH (ref <117)

## 2014-04-12 MED ORDER — CARVEDILOL 12.5 MG PO TABS
12.5000 mg | ORAL_TABLET | Freq: Two times a day (BID) | ORAL | Status: DC
  Filled 2014-04-12 (×2): qty 1

## 2014-04-12 MED ORDER — PANTOPRAZOLE SODIUM 40 MG PO TBEC: 40.0000 mg | DELAYED_RELEASE_TABLET | Freq: Every day | ORAL | Status: DC

## 2014-04-12 MED ORDER — ATORVASTATIN CALCIUM 40 MG PO TABS
40.0000 mg | ORAL_TABLET | Freq: Every day | ORAL | Status: DC
  Filled 2014-04-12: qty 1

## 2014-04-12 MED ORDER — CARVEDILOL 12.5 MG PO TABS: 12.5000 mg | ORAL_TABLET | Freq: Two times a day (BID) | ORAL | Status: DC

## 2014-04-12 NOTE — Progress Notes (Signed)
Patient: Trevor Murphy / Admit Date: 04/11/2014 / Date of Encounter: 04/12/2014, 10:17 AM   Subjective: In short, 79 y.o. male CAD, HFpEF, recent episode of SVT with elevated TnI and subsequent negative nuclear stress test (01/2014), CKD stage III, h/o CVA, and HTN who presented to [facility] overnight with chest pain over the course of the day, as well as a presyncopal episode followed by headache, nausea, and one episode of emesis. He has had 2 of 3 negative TnI to date. EKG: NSR, 66, 1st degree block, nl axis, there is new inferolateral TWI when compared to prior studies. Has been having increasing SOB/DOE, and fatigue over the past month. Got to the point that he dropped the trash yesterday 2/2 weakness/SOB prompting him to come in for evaluation. Has been under a lot of stress lately with his wife recently passing.    No current chest pain, palpitations, SOB, or dyspnea.    Objective: Telemetry: NSR, 80s, 1st degree block, several PVCs Physical Exam: Blood pressure 121/69, pulse 74, temperature 98.6 F (37 C), temperature source Oral, resp. rate 16, height 5' 10.5" (1.791 m), weight 163 lb 14.4 oz (74.345 kg), SpO2 98.00%. General: Well developed, well nourished, in no acute distress. Head: Normocephalic, atraumatic, sclera non-icteric, no xanthomas, nares are without discharge. Neck: Negative for carotid bruits. JVP not elevated. Lungs: Clear bilaterally. Breathing is unlabored. Heart: RRR S1 S2 without murmurs, rubs, or gallops.  Abdomen: Soft, non-tender, non-distended with normoactive bowel sounds. No rebound/guarding. Extremities: No clubbing or cyanosis. No edema. Distal pedal pulses are 2+ and equal bilaterally. Neuro: Alert and oriented X 3. Moves all extremities spontaneously. Psych:  Responds to questions appropriately with a normal affect.   Intake/Output Summary (Last 24 hours) at 04/12/14 1017 Last data filed at 04/12/14 0502  Gross per 24 hour  Intake    658 ml  Output    350  ml  Net    308 ml    Inpatient Medications:  . allopurinol  100 mg Oral Daily  . amLODipine  10 mg Oral Daily  . aspirin EC  325 mg Oral Daily  . carvedilol  18.75 mg Oral q morning - 10a  . cloNIDine  0.1 mg Oral BID  . colchicine  0.6 mg Oral BID  . [START ON 04/13/2014] furosemide  20 mg Oral QODAY  . heparin  5,000 Units Subcutaneous 3 times per day  . hydrALAZINE  100 mg Oral 3 times per day  . isosorbide mononitrate  30 mg Oral Daily  . potassium chloride  10 mEq Oral QODAY  . sodium chloride  3 mL Intravenous Q12H   Infusions:    Labs:  Recent Labs  04/11/14 1346 04/11/14 1823 04/12/14 0334  NA 138 139 142  K 4.3 4.1 4.4  CL 103 107 106  CO2 23  --  24  GLUCOSE 165* 118* 93  BUN 39* 39* 38*  CREATININE 1.99* 2.00* 1.98*  CALCIUM 9.0  --  8.3*    Recent Labs  04/12/14 0334  AST 15  ALT 8  ALKPHOS 74  BILITOT 0.2*  PROT 6.6  ALBUMIN 3.2*    Recent Labs  04/11/14 1346 04/11/14 1814 04/11/14 1823  WBC 5.4 4.9  --   HGB 14.6 14.8 16.3  HCT 42.9 43.9 48.0  MCV 82.3 82.5  --   PLT 244 167  --     Recent Labs  04/11/14 2150 04/12/14 0334  TROPONINI <0.30 <0.30   A1C: In  process  Radiology/Studies:  None.    Assessment and Plan  79 y.o. male CAD, HFpEF, recent episode of SVT with elevated TnI and subsequent negative nuclear stress test (01/2014), CKD stage III, h/o CVA, and HTN who presented to [facility] overnight with chest pain over the course of the day, as well as a presyncopal episode followed by headache, nausea, and one episode of emesis. He has had 2 of 3 negative TnI to date. EKG: NSR, 66, 1st degree block, nl axis, there is new inferolateral TWI when compared to prior studies.   1. Chest pain with moderate risk of cardiac etiology:  -TnI negative x 2, await final TnI -ProBNP 248-does not appear volume overloaded -He reports last cath being "many years ago," on record it was 2005 -Nuclear stress 01/2014 normal, EF 54% nl LV function, nl wall  motion -Disuss further with MD -Aspirin 81 mg daily, change Coreg to 12.5 mg bid, add Lipitor 40 mg -Trial of PPI  2. CKD stage III -GFR approx at baseline  3. HTN: -Controlled -Continue current regimen     Signed, Christell Faith, PA-C 04/12/2014 10:51 AM As above, patient seen and examined. The patient presented with complaints predominantly of headache. Also with vague chest pain. He notes chronic dyspnea on exertion. Recent negative nuclear study. Electrocardiogram shows increased inferior lateral T-wave inversion more prominent compared to previous. However enzymes are negative. There would be risk of contrast nephropathy with cardiac catheterization. Based on all available data I have elected to continue medical therapy. Continue aspirin and add statin. Continue carvedilol but change to 12.5 mg by mouth twice a day. Continue hydralazine/nitrate. Continue present dose of Lasix. Follow up with Dr. Aundra Dubin 1-2 weeks following discharge. Kirk Ruths

## 2014-04-12 NOTE — Discharge Summary (Addendum)
Physician Discharge Summary  Trevor Murphy [id number] DOB: 05-01-1935 DOA: 04/11/2014  PCP: Maximino Greenland, MD  Admit date: 04/11/2014 Discharge date: 04/12/2014  Time spent: 35 minutes  Recommendations for Outpatient Follow-up:  1. Follow up with PCP in 2-4 weeks.  Discharge Diagnoses:  Principal Problem:   Atypical chest pain Active Problems:   CKD (chronic kidney disease) stage 3, GFR 30-59 ml/min   Chest pain   Chronic diastolic CHF (congestive heart failure)   Discharge Condition: stable  Diet recommendation: Heart healthy  Filed Weights   04/11/14 1339 04/12/14 0506  Weight: 74.39 kg (164 lb) 74.345 kg (163 lb 14.4 oz)    History of present illness:  79 y.o. year old male with significant past medical history of HTN, diastolic CHF, non obstructive CAD, CVA presenting with chest pain. Pt states that he had recurrent CP over the course of the day. Also with mild generalized headache and nausea. Was unable to tolerate breakfast earlier today. Had one episode of NBNB vomiting. CP was present both at rest as well as with exertion. Pt took his BP and was around the 0000000 systolic. Pt states that he took a NTG for his CP, which helped relieve his sxs, but his BP worse with pt feeling weak an dizzy. Denies any LOC, syncope, hemiparesis. Pt was directed to go to ER by family. Pt does report being under stress because of his wife recently dying. Patient as noted had a recent nuclear stress test with cardiology within the past 2 months that was normal. Dr. Freeman Caldron Course:  Atypical chest pain  - EKG shows diffuse T wave inversions, more pronounced than on prior EKGs  - Cardiac enzymes x3 negative.  - I would Favor conservative medical management given elevated creatinine and recent negative stress test.  - cont PPI. Cardiology consult and agreed on plan.  - start proto nix.  Chronic diastolic CHF (congestive heart failure)  - euvolemic   CKD III: - at  baseline  Procedures:  CXR  Consultations:  cardiology  Discharge Exam: Filed Vitals:   04/12/14 0506  BP: 121/69  Pulse: 74  Temp: 98.6 F (37 C)  Resp: 16    General: A&O x3 Cardiovascular: RRR Respiratory: good air movement CTA B/L  Discharge Instructions You were cared for by a hospitalist during your hospital stay. If you have any questions about your discharge medications or the care you received while you were in the hospital after you are discharged, you can call the unit and asked to speak with the hospitalist on call if the hospitalist that took care of you is not available. Once you are discharged, your primary care physician will handle any further medical issues. Please note that NO REFILLS for any discharge medications will be authorized once you are discharged, as it is imperative that you return to your primary care physician (or establish a relationship with a primary care physician if you do not have one) for your aftercare needs so that they can reassess your need for medications and monitor your lab values.  Discharge Instructions   Diet - low sodium heart healthy    Complete by:  As directed      Increase activity slowly    Complete by:  As directed           Current Discharge Medication List    START taking these medications   Details  pantoprazole (PROTONIX) 40 MG tablet Take 1 tablet (40 mg total) by mouth  daily. Qty: 60 tablet, Refills: 0      CONTINUE these medications which have CHANGED   Details  carvedilol (COREG) 12.5 MG tablet Take 1 tablet (12.5 mg total) by mouth 2 (two) times daily with a meal. Qty: 60 tablet, Refills: 3      CONTINUE these medications which have NOT CHANGED   Details  acetaminophen (TYLENOL) 500 MG tablet Take 1,000 mg by mouth every 6 (six) hours as needed for headache.    allopurinol (ZYLOPRIM) 100 MG tablet Take 100 mg by mouth daily.    amLODipine (NORVASC) 10 MG tablet Take 1 tablet (10 mg total) by mouth  daily. Qty: 30 tablet, Refills: 0    aspirin EC 81 MG tablet Take 81 mg by mouth daily.    cloNIDine (CATAPRES) 0.1 MG tablet Take 1 tablet (0.1 mg total) by mouth 2 (two) times daily. Qty: 60 tablet, Refills: 0    colchicine 0.6 MG tablet Take 0.6 mg by mouth 2 (two) times daily.    furosemide (LASIX) 20 MG tablet Take 1 tablet (20 mg total) by mouth every other day. Qty: 30 tablet, Refills: 3   Associated Diagnoses: Chronic kidney disease, stage III (moderate); Shortness of breath; Unspecified essential hypertension    hydrALAZINE (APRESOLINE) 100 MG tablet Take 1 tablet (100 mg total) by mouth every 8 (eight) hours. Qty: 90 tablet, Refills: 5    isosorbide mononitrate (IMDUR) 30 MG 24 hr tablet Take 1 tablet (30 mg total) by mouth daily. Qty: 30 tablet, Refills: 0    nitroGLYCERIN (NITROSTAT) 0.4 MG SL tablet Place 1 tablet (0.4 mg total) under the tongue every 5 (five) minutes as needed for chest pain. Qty: 30 tablet, Refills: 0    potassium chloride (K-DUR,KLOR-CON) 10 MEQ tablet Take 1 tablet (10 mEq total) by mouth every other day. Qty: 30 tablet, Refills: 2   Associated Diagnoses: Chronic kidney disease, stage III (moderate); Shortness of breath; Unspecified essential hypertension       No Known Allergies    The results of significant diagnostics from this hospitalization (including imaging, microbiology, ancillary and laboratory) are listed below for reference.    Significant Diagnostic Studies: No results found.  Microbiology: No results found for this or any previous visit (from the past 240 hour(s)).   Labs: Basic Metabolic Panel:  Recent Labs Lab 04/11/14 1346 04/11/14 1823 04/12/14 0334  NA 138 139 142  K 4.3 4.1 4.4  CL 103 107 106  CO2 23  --  24  GLUCOSE 165* 118* 93  BUN 39* 39* 38*  CREATININE 1.99* 2.00* 1.98*  CALCIUM 9.0  --  8.3*   Liver Function Tests:  Recent Labs Lab 04/12/14 0334  AST 15  ALT 8  ALKPHOS 74  BILITOT 0.2*   PROT 6.6  ALBUMIN 3.2*   No results found for this basename: LIPASE, AMYLASE,  in the last 168 hours No results found for this basename: AMMONIA,  in the last 168 hours CBC:  Recent Labs Lab 04/11/14 1346 04/11/14 1814 04/11/14 1823 04/12/14 1225  WBC 5.4 4.9  --  3.7*  NEUTROABS  --   --   --  1.9  HGB 14.6 14.8 16.3 12.6*  HCT 42.9 43.9 48.0 37.3*  MCV 82.3 82.5  --  80.9  PLT 244 167  --  187   Cardiac Enzymes:  Recent Labs Lab 04/11/14 2150 04/12/14 0334 04/12/14 0925  TROPONINI <0.30 <0.30 <0.30   BNP: BNP (last 3 results)  Recent Labs  01/22/14 1621 02/08/14 1428 04/11/14 1346  PROBNP 848.2* 73.0 248.8   CBG: No results found for this basename: GLUCAP,  in the last 168 hours     Signed:  Charlynne Cousins  [facility] 04/12/2014, 1:40 PM

## 2014-04-12 NOTE — Consult Note (Signed)
CARDIOLOGY CONSULT NOTE  Patient ID: Trevor Murphy  MRN: [id number]  DOB: [birth year]  Admit date: 04/11/2014 Date of Consult: 04/12/2014  Primary Physician: Maximino Greenland, MD Primary Cardiologist: Aundra Dubin  Reason for Consultation: Chest Pain  History of Present Illness: Trevor Murphy is a 79 y.o. male with a history of HTN, HFpEF, a recent episode of SVT with elevated troponin and subsequent negative nuclear stress test (01/2014).  He presents with recurrent chest pain over the course of the day, as well as a presyncopal episode followed by headache, nausea, and one episode of vomiting.  Some hypotension, now resolved.  His wife recently died and is under a lot of stress from that.  Initial cardiac enzymes are normal.  EKG shows diffuse T wave inversions, more pronounced than on prior EKGs.   Currently CP free.  Past Medical History  Diagnosis Date  . Poor circulation   . Hernia     umbilical  . Gout   . Glaucoma   . Hypertension     does not see a cardiologist  . Arthritis   . Chronic kidney disease   . Headache(784.0)     " WHEN MY BLOOD PRESSURE IS UP"  . Other and unspecified hyperlipidemia   . OA (osteoarthritis)   . CAD (coronary artery disease)     nonobstructive  . History of acute renal failure   . Blindness of left eye   . CKD (chronic kidney disease) stage 3, GFR 30-59 ml/min 07/26/2012  . CVA (cerebral infarction) 07/28/2012    Past Surgical History  Procedure Laterality Date  . Hernia repair    . Ventral hernia repair  03/07/2012    Procedure: LAPAROSCOPIC VENTRAL HERNIA;  Surgeon: Gwenyth Ober, MD;  Location: [facility];  Service: General;  Laterality: N/A;  . Appendectomy    . Cataract extraction extracapsular Right 05/17/2013    Procedure: CATARACT EXTRACTION EXTRACAPSULAR WITH INTRAOCULAR LENS PLACEMENT (IOC) RIGHT EYE;  Surgeon: Marylynn Pearson, MD;  Location: [place];  Service: Ophthalmology;  Laterality: Right;  . Cardiac catheterization  11/2003    EF 50-55%  .  Cardiac catheterization  08/2009     Home Meds: Prior to Admission medications   Medication Sig Start Date End Date Taking? Authorizing Provider  acetaminophen (TYLENOL) 500 MG tablet Take 1,000 mg by mouth every 6 (six) hours as needed for headache.   Yes Historical Provider, MD  allopurinol (ZYLOPRIM) 100 MG tablet Take 100 mg by mouth daily.   Yes Historical Provider, MD  amLODipine (NORVASC) 10 MG tablet Take 1 tablet (10 mg total) by mouth daily. 03/12/14  Yes Thurnell Lose, MD  aspirin EC 81 MG tablet Take 81 mg by mouth daily.   Yes Historical Provider, MD  carvedilol (COREG) 6.25 MG tablet Take 18.75 mg by mouth every morning.    Yes Historical Provider, MD  cloNIDine (CATAPRES) 0.1 MG tablet Take 1 tablet (0.1 mg total) by mouth 2 (two) times daily. 03/12/14  Yes Thurnell Lose, MD  colchicine 0.6 MG tablet Take 0.6 mg by mouth 2 (two) times daily.   Yes Historical Provider, MD  furosemide (LASIX) 20 MG tablet Take 1 tablet (20 mg total) by mouth every other day. 02/12/14  Yes Larey Dresser, MD  hydrALAZINE (APRESOLINE) 100 MG tablet Take 1 tablet (100 mg total) by mouth every 8 (eight) hours. 12/18/13  Yes Ripudeep Krystal Eaton, MD  isosorbide mononitrate (IMDUR) 30 MG 24 hr tablet Take 1 tablet (  30 mg total) by mouth daily. 03/12/14  Yes Thurnell Lose, MD  nitroGLYCERIN (NITROSTAT) 0.4 MG SL tablet Place 1 tablet (0.4 mg total) under the tongue every 5 (five) minutes as needed for chest pain. 12/21/13  Yes Geradine Girt, DO  potassium chloride (K-DUR,KLOR-CON) 10 MEQ tablet Take 1 tablet (10 mEq total) by mouth every other day. 02/12/14  Yes Larey Dresser, MD    Current Medications: . allopurinol  100 mg Oral Daily  . amLODipine  10 mg Oral Daily  . aspirin EC  325 mg Oral Daily  . carvedilol  18.75 mg Oral q morning - 10a  . cloNIDine  0.1 mg Oral BID  . colchicine  0.6 mg Oral BID  . [START ON 04/13/2014] furosemide  20 mg Oral QODAY  . heparin  5,000 Units Subcutaneous 3 times per  day  . hydrALAZINE  100 mg Oral 3 times per day  . isosorbide mononitrate  30 mg Oral Daily  . potassium chloride  10 mEq Oral QODAY  . sodium chloride  3 mL Intravenous Q12H     Allergies:   No Known Allergies  Social History:   The patient  reports that he has never smoked. He has never used smokeless tobacco. He reports that he drinks about .6 ounces of alcohol per week. He reports that he does not use illicit drugs.    Family History:   The patient's family history includes CVA in his mother; Hypertension in his brother, brother, and sister.   ROS:  Please see the history of present illness. All other systems reviewed and negative.   Vital Signs: Blood pressure 127/74, pulse 78, temperature 97.7 F (36.5 C), temperature source Oral, resp. rate 16, height 5\' 10"  (1.778 m), weight 74.39 kg (164 lb), SpO2 98.00%.   PHYSICAL EXAM: General:  Elderly male, NAD Neck: no JVD Cardiac:  normal S1, S2; RRR; no murmur Lungs:  clear to auscultation bilaterally, no wheezing, rhonchi or rales Abd: soft, nontender, no hepatomegaly Ext: no edema Skin: warm and dry Neuro:  CNs 2-12 intact, no focal abnormalities noted Psych:  Normal affect   EKG:  NSR.  Diffuse TWI, more pronounced than prior EKGs.   Labs:  Recent Labs  04/11/14 2150  TROPONINI <0.30   Lab Results  Component Value Date   WBC 4.9 04/11/2014   HGB 16.3 04/11/2014   HCT 48.0 04/11/2014   MCV 82.5 04/11/2014   PLT 167 04/11/2014    Recent Labs Lab 04/11/14 1346 04/11/14 1823  NA 138 139  K 4.3 4.1  CL 103 107  CO2 23  --   BUN 39* 39*  CREATININE 1.99* 2.00*  CALCIUM 9.0  --   GLUCOSE 165* 118*   Lab Results  Component Value Date   CHOL 163 04/11/2014   HDL 39* 04/11/2014   LDLCALC 86 04/11/2014   TRIG 192* 04/11/2014   Lab Results  Component Value Date   DDIMER 1.43* 12/18/2013    Radiology/Studies:  No results found.  ASSESSMENT AND PLAN:  1. Chest Pain - Favor conservative medical management given  elevated creatinine and recent negative stress test.   - Check Cardiac enzymes x3 - His Coreg dose is a bit odd, usually a BID med, could consider changing to 12.5 BID - Can decrease to 81mg  of aspirin. - Consider trial of PPI incase there is an element of GERD with nausea/vomiting.  Cleotis Lema 04/12/2014 2:28 AM

## 2014-04-20 ENCOUNTER — Ambulatory Visit (INDEPENDENT_AMBULATORY_CARE_PROVIDER_SITE_OTHER): Payer: Medicare Other | Admitting: Physician Assistant

## 2014-04-20 ENCOUNTER — Encounter: Payer: Self-pay | Admitting: Physician Assistant

## 2014-04-20 VITALS — BP 148/82 | HR 81 | Ht 70.5 in | Wt 165.0 lb

## 2014-04-20 DIAGNOSIS — I5032 Chronic diastolic (congestive) heart failure: Secondary | ICD-10-CM

## 2014-04-20 DIAGNOSIS — E785 Hyperlipidemia, unspecified: Secondary | ICD-10-CM

## 2014-04-20 DIAGNOSIS — I10 Essential (primary) hypertension: Secondary | ICD-10-CM

## 2014-04-20 DIAGNOSIS — I509 Heart failure, unspecified: Secondary | ICD-10-CM

## 2014-04-20 DIAGNOSIS — I2510 Atherosclerotic heart disease of native coronary artery without angina pectoris: Secondary | ICD-10-CM | POA: Insufficient documentation

## 2014-04-20 DIAGNOSIS — I471 Supraventricular tachycardia: Secondary | ICD-10-CM

## 2014-04-20 DIAGNOSIS — D649 Anemia, unspecified: Secondary | ICD-10-CM

## 2014-04-20 DIAGNOSIS — I498 Other specified cardiac arrhythmias: Secondary | ICD-10-CM

## 2014-04-20 LAB — CBC WITH DIFFERENTIAL/PLATELET
Basophils Absolute: 0 10*3/uL (ref 0.0–0.1)
Basophils Relative: 0.7 % (ref 0.0–3.0)
Eosinophils Absolute: 0.2 10*3/uL (ref 0.0–0.7)
Eosinophils Relative: 3.8 % (ref 0.0–5.0)
HCT: 45.1 % (ref 39.0–52.0)
Hemoglobin: 14.8 g/dL (ref 13.0–17.0)
Lymphocytes Relative: 33.1 % (ref 12.0–46.0)
Lymphs Abs: 1.7 10*3/uL (ref 0.7–4.0)
MCHC: 32.9 g/dL (ref 30.0–36.0)
MCV: 83.1 fl (ref 78.0–100.0)
Monocytes Absolute: 0.7 10*3/uL (ref 0.1–1.0)
Monocytes Relative: 12.6 % — ABNORMAL HIGH (ref 3.0–12.0)
Neutro Abs: 2.6 10*3/uL (ref 1.4–7.7)
Neutrophils Relative %: 49.8 % (ref 43.0–77.0)
Platelets: 206 10*3/uL (ref 150.0–400.0)
RBC: 5.43 Mil/uL (ref 4.22–5.81)
RDW: 17.3 % — ABNORMAL HIGH (ref 11.5–15.5)
WBC: 5.2 10*3/uL (ref 4.0–10.5)

## 2014-04-20 MED ORDER — ROSUVASTATIN CALCIUM 5 MG PO TABS: 5.0000 mg | ORAL_TABLET | Freq: Every day | ORAL | Status: DC

## 2014-04-20 NOTE — Progress Notes (Signed)
[place], [address] [place], [place]  [address] Phone: [phone] Fax:  [phone]  Date:  04/20/2014   Patient ID:  Knowledge, Baquero [birth year], MRN [id number]   PCP:  Maximino Greenland, MD  Cardiologist:  Aundra Dubin  History of Present Illness: Mr. Klomp is a 79 y/o M with history of chronic diastolic CHF, SVT ([id number]; also 12/2013 associated with minimal TnI elevation with subsequent normal stress test 01/2014), minimal nonobstructive CAD by cath 2011, HTN, CKD stage III, stroke, L eye blindness, h/o EtOH abuse who presents for post-hospital followup. He was admitted 9/2-9/3 with complaints predominantly of headache but also chest pain, near-syncope, nausea and vomiting. EKG showed NSR with more pronounced inferolateral TWI compared to prior studies (but similar to 12/19/13 eKG). Troponins remained negative. Due to risk of contrast nephropathy, Dr. Stanford Breed recommended to continue med rx based on available data. He had only been taking Coreg once daily and this was changed to BID. Lasix was continued. TSH wnl. Hgb on admission was 14.6, on 9/3 was 12.6 without reported bleeding.  He comes in feeling better. No acute complaints. No further CP, SOB, near syncope or syncope. His wife unfortunately died just under 2 weeks ago so he is adjusting to that. His son manages his medicine box. The list he brings in today lists Coreg 6.25mg  BID when he was discharged on Coreg 12.5mg  BID.  Last echo 12/2013: mild LVH, EF 60-65%, no RWMA, mild AI.  Recent Labs: 04/11/2014: HDL Cholesterol by NMR 39*; LDL (calc) 86; Pro B Natriuretic peptide (BNP) 248.8; TSH 1.030  04/12/2014: ALT 8; Creatinine 1.98*; Hemoglobin 12.6*; Potassium 4.4   Wt Readings from Last 3 Encounters:  04/20/14 165 lb (74.844 kg)  04/12/14 163 lb 14.4 oz (74.345 kg)  03/11/14 166 lb 3.2 oz (75.388 kg)     Past Medical History  Diagnosis Date  . Poor circulation   . H/O umbilical hernia repair   . Gout   . Glaucoma   .  Hypertension     a. Patent renal arteries 2005.  [name] Kitchen Arthritis   . Headache(784.0)     " WHEN MY BLOOD PRESSURE IS UP"  . Other and unspecified hyperlipidemia   . OA (osteoarthritis)   . CAD (coronary artery disease)     a. Cath 08/2009: minimal nonobstructive disease (25% LAD). b. Lexiscan nuc 6/215: EF 54%, no ischemia or infarction.  [name] Kitchen History of acute renal failure   . Blindness of left eye   . CKD (chronic kidney disease) stage 3, GFR 30-59 ml/min 07/26/2012    a. Felt likely due to HTN.  . CVA (cerebral infarction) 07/28/2012  . HTN (hypertension)   . Chronic diastolic CHF (congestive heart failure)     a. Echo 2013: EF 55-60%, mild AI, mild MR. b. Echo 5/15 with EF 60-65%, mild LVH, mild AI.  [name] Kitchen Noncompliance   . 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  Got adenosine from EMS in 2015 for SVT.  During 5/15 hospitalization had episode of short R-P tachycardia (possible AVNRT) terminated by adenosine.   [name] Kitchen History of alcohol abuse     Current Outpatient Prescriptions  Medication Sig Dispense Refill  . acetaminophen (TYLENOL) 500 MG tablet Take 1,000 mg by mouth every 6 (six) hours as needed for headache.      . allopurinol (ZYLOPRIM) 100 MG tablet Take 100 mg by mouth daily.      [name] Kitchen amLODipine (NORVASC) 10 MG tablet Take  1 tablet (10 mg total) by mouth daily.  30 tablet  0  . aspirin EC 81 MG tablet Take 81 mg by mouth daily.      . carvedilol (COREG) 12.5 MG tablet Take 6.25 mg by mouth 2 (two) times daily with a meal.      . cloNIDine (CATAPRES) 0.1 MG tablet Take 1 tablet (0.1 mg total) by mouth 2 (two) times daily.  60 tablet  0  . colchicine 0.6 MG tablet Take 0.6 mg by mouth 2 (two) times daily.      . furosemide (LASIX) 20 MG tablet Take 1 tablet (20 mg total) by mouth every other day.  30 tablet  3  . hydrALAZINE (APRESOLINE) 100 MG tablet Take 1 tablet (100 mg total) by mouth every 8 (eight) hours.  90 tablet  5  . isosorbide mononitrate (IMDUR) 30 MG 24 hr  tablet Take 1 tablet (30 mg total) by mouth daily.  30 tablet  0  . nitroGLYCERIN (NITROSTAT) 0.4 MG SL tablet Place 1 tablet (0.4 mg total) under the tongue every 5 (five) minutes as needed for chest pain.  30 tablet  0  . pantoprazole (PROTONIX) 40 MG tablet Take 1 tablet (40 mg total) by mouth daily.  60 tablet  0  . potassium chloride (K-DUR,KLOR-CON) 10 MEQ tablet Take 1 tablet (10 mEq total) by mouth every other day.  30 tablet  2  . rosuvastatin (CRESTOR) 5 MG tablet Take 1 tablet (5 mg total) by mouth daily.  30 tablet  6   No current facility-administered medications for this visit.    Allergies:   Review of patient's allergies indicates no known allergies.   Social History:  The patient  reports that he has never smoked. He has never used smokeless tobacco. He reports that he drinks about .6 ounces of alcohol per week. He reports that he does not use illicit drugs.   Family History:  The patient's family history includes CVA in his mother; Hypertension in his brother, brother, and sister.   ROS:  Please see the history of present illness.   All other systems reviewed and negative.   PHYSICAL EXAM:  VS:  BP 148/82  Pulse 81  Ht 5' 10.5" (1.791 m)  Wt 165 lb (74.844 kg)  BMI 23.33 kg/m2 Well nourished, well developed elderly AAM in no acute distress, uses cane HEENT: normal, wearing sunglasses Neck: no JVD Cardiac:  normal S1, S2; RRR; no murmur Lungs:  clear to auscultation bilaterally, no wheezing, rhonchi or rales Abd: soft, nontender, no hepatomegaly Ext: no edema Skin: warm and dry Neuro:  moves all extremities spontaneously, no focal abnormalities noted  EKG:  NSR 81bpm, 1st degree AVB, nonspecific ST-T changes laterally    ASSESSMENT AND PLAN:  1. Recent chest pain with h/o minimal CAD 2011, normal nuc 01/2014 - medically managed. No further CP. 2. HTN - BP is somewhat elevated in clinic, recheck 152/84. He has not taken his AM meds yet today. His Coreg dose is  also different on the home health list he brings in (6.25mg  BID) versus what was prescribed at discharge recently (12.5mg  BID). His son manages his medication and does not recall cutting any pills in half. His son will check the dose when he gets home and call us to clarify. Since the patient has not yet taken meds today, I have asked him to take them when he gets home and to record BP daily. They will call back in 1  week with this result and we can adjust meds accordingly. 3. Chronic diastolic CHF in context of CKD stage III - appears euvolemic. See re: BP. 4. H/o SVT 12/2013 - quiescent. 5. Dyslipidemia - continue Crestor. Recent CMET ok. 6. Mild anemia - may be dilutional from IV fluids but recheck today. No bleeding noted.  Dispo: F/u Dr. Aundra Dubin 2-3 months.  Signed, Melina Copa, PA-C  04/20/2014 11:56 AM

## 2014-04-20 NOTE — Patient Instructions (Addendum)
Your physician recommends that you have lab work TODAY:CBC    Your physician has requested that you regularly monitor and record your blood pressure readings at home for 1 week  Please use the same machine at the same time of day to check your readings and record them  call us with reading.  Please call us to verify Coreg dose  Your physician recommends that you continue on your current medications as directed. Please refer to the Current Medication list given to you today.   Your physician recommends that you schedule a follow-up appointment in: 2 to 3 months with Dr. Aundra Dubin

## 2014-04-26 ENCOUNTER — Encounter (HOSPITAL_COMMUNITY): Payer: Self-pay | Admitting: Emergency Medicine

## 2014-04-26 ENCOUNTER — Emergency Department (HOSPITAL_COMMUNITY): Payer: Medicare Other

## 2014-04-26 ENCOUNTER — Emergency Department (HOSPITAL_COMMUNITY)
Admission: EM | Admit: 2014-04-26 | Discharge: 2014-04-27 | Disposition: A | Discharge status: Home / Self Care | Payer: Medicare Other | Attending: Emergency Medicine | Admitting: Emergency Medicine

## 2014-04-26 DIAGNOSIS — Z9849 Cataract extraction status, unspecified eye: Secondary | ICD-10-CM | POA: Diagnosis not present

## 2014-04-26 DIAGNOSIS — R0789 Other chest pain: Secondary | ICD-10-CM | POA: Diagnosis not present

## 2014-04-26 DIAGNOSIS — Z8719 Personal history of other diseases of the digestive system: Secondary | ICD-10-CM | POA: Insufficient documentation

## 2014-04-26 DIAGNOSIS — N183 Chronic kidney disease, stage 3 unspecified: Secondary | ICD-10-CM | POA: Diagnosis not present

## 2014-04-26 DIAGNOSIS — R079 Chest pain, unspecified: Secondary | ICD-10-CM | POA: Diagnosis present

## 2014-04-26 DIAGNOSIS — Z961 Presence of intraocular lens: Secondary | ICD-10-CM | POA: Diagnosis not present

## 2014-04-26 DIAGNOSIS — M109 Gout, unspecified: Secondary | ICD-10-CM | POA: Insufficient documentation

## 2014-04-26 DIAGNOSIS — F1021 Alcohol dependence, in remission: Secondary | ICD-10-CM | POA: Insufficient documentation

## 2014-04-26 DIAGNOSIS — Z91199 Patient's noncompliance with other medical treatment and regimen due to unspecified reason: Secondary | ICD-10-CM | POA: Diagnosis not present

## 2014-04-26 DIAGNOSIS — Z9119 Patient's noncompliance with other medical treatment and regimen: Secondary | ICD-10-CM | POA: Diagnosis not present

## 2014-04-26 DIAGNOSIS — Z8739 Personal history of other diseases of the musculoskeletal system and connective tissue: Secondary | ICD-10-CM | POA: Diagnosis not present

## 2014-04-26 DIAGNOSIS — Z8673 Personal history of transient ischemic attack (TIA), and cerebral infarction without residual deficits: Secondary | ICD-10-CM | POA: Insufficient documentation

## 2014-04-26 DIAGNOSIS — E785 Hyperlipidemia, unspecified: Secondary | ICD-10-CM | POA: Diagnosis not present

## 2014-04-26 DIAGNOSIS — I129 Hypertensive chronic kidney disease with stage 1 through stage 4 chronic kidney disease, or unspecified chronic kidney disease: Secondary | ICD-10-CM | POA: Insufficient documentation

## 2014-04-26 DIAGNOSIS — Z9889 Other specified postprocedural states: Secondary | ICD-10-CM | POA: Diagnosis not present

## 2014-04-26 DIAGNOSIS — Z79899 Other long term (current) drug therapy: Secondary | ICD-10-CM | POA: Insufficient documentation

## 2014-04-26 DIAGNOSIS — Z8669 Personal history of other diseases of the nervous system and sense organs: Secondary | ICD-10-CM | POA: Diagnosis not present

## 2014-04-26 DIAGNOSIS — I2510 Atherosclerotic heart disease of native coronary artery without angina pectoris: Secondary | ICD-10-CM | POA: Diagnosis not present

## 2014-04-26 DIAGNOSIS — I5032 Chronic diastolic (congestive) heart failure: Secondary | ICD-10-CM | POA: Insufficient documentation

## 2014-04-26 LAB — CBC
HCT: 49.1 % (ref 39.0–52.0)
Hemoglobin: 17.1 g/dL — ABNORMAL HIGH (ref 13.0–17.0)
MCH: 28.3 pg (ref 26.0–34.0)
MCHC: 34.8 g/dL (ref 30.0–36.0)
MCV: 81.2 fL (ref 78.0–100.0)
Platelets: 205 10*3/uL (ref 150–400)
RBC: 6.05 MIL/uL — ABNORMAL HIGH (ref 4.22–5.81)
RDW: 15.9 % — ABNORMAL HIGH (ref 11.5–15.5)
WBC: 6.8 10*3/uL (ref 4.0–10.5)

## 2014-04-26 LAB — BASIC METABOLIC PANEL
Anion gap: 20 — ABNORMAL HIGH (ref 5–15)
BUN: 15 mg/dL (ref 6–23)
CO2: 23 mEq/L (ref 19–32)
Calcium: 9.7 mg/dL (ref 8.4–10.5)
Chloride: 100 mEq/L (ref 96–112)
Creatinine, Ser: 1.68 mg/dL — ABNORMAL HIGH (ref 0.50–1.35)
GFR calc Af Amer: 44 mL/min — ABNORMAL LOW (ref >90)
GFR calc non Af Amer: 38 mL/min — ABNORMAL LOW (ref >90)
Glucose, Bld: 122 mg/dL — ABNORMAL HIGH (ref 70–99)
Potassium: 4.1 mEq/L (ref 3.7–5.3)
Sodium: 143 mEq/L (ref 137–147)

## 2014-04-26 LAB — I-STAT TROPONIN, ED: Troponin i, poc: 0.02 ng/mL (ref 0.00–0.08)

## 2014-04-26 LAB — PRO B NATRIURETIC PEPTIDE: Pro B Natriuretic peptide (BNP): 504.8 pg/mL — ABNORMAL HIGH (ref 0–450)

## 2014-04-26 MED ORDER — ASPIRIN 81 MG PO CHEW
324.0000 mg | CHEWABLE_TABLET | Freq: Once | ORAL | Status: AC
  Administered 2014-04-27: 324 mg via ORAL
  Filled 2014-04-26: qty 4

## 2014-04-26 MED ORDER — ONDANSETRON HCL 4 MG/2ML IJ SOLN
4.0000 mg | Freq: Once | INTRAMUSCULAR | Status: AC
  Administered 2014-04-26: 4 mg via INTRAVENOUS
  Filled 2014-04-26: qty 2

## 2014-04-26 MED ORDER — MORPHINE SULFATE 4 MG/ML IJ SOLN
4.0000 mg | Freq: Once | INTRAMUSCULAR | Status: AC
  Administered 2014-04-26: 4 mg via INTRAVENOUS
  Filled 2014-04-26: qty 1

## 2014-04-26 MED ORDER — SODIUM CHLORIDE 0.9 % IV BOLUS (SEPSIS)
500.0000 mL | Freq: Once | INTRAVENOUS | Status: AC
  Administered 2014-04-26: 500 mL via INTRAVENOUS

## 2014-04-26 NOTE — ED Provider Notes (Signed)
TIME SEEN: 11:10 PM  CHIEF COMPLAINT: Chest pain  HPI: Patient is a 79 y.o. M with history of hypertension, chronic kidney disease, CVA, diastolic heart failure, CAD with reported history of 25% stenosis of LAD on catheterization January 2011, recent Lexi scan that was normal who presents emergency department with left-sided chest tightness and sharp pain that started while at rest at 7 PM tonight. It is worse with exertion. He is having shortness of breath, nausea and dizziness. No vomiting or diaphoresis. He is also complaining of a diffuse throbbing headache. No thunderclap headache. Has had similar headaches before. No numbness, tingling or focal weakness. No fevers or chills. No cough. No lower extremity swelling or pain.  ROS: See HPI Constitutional: no fever  Eyes: no drainage  ENT: no runny nose   Cardiovascular:   chest pain  Resp: no SOB  GI: no vomiting GU: no dysuria Integumentary: no rash  Allergy: no hives  Musculoskeletal: no leg swelling  Neurological: no slurred speech ROS otherwise negative  PAST MEDICAL HISTORY/PAST SURGICAL HISTORY:  Past Medical History  Diagnosis Date  . Poor circulation   . H/O umbilical hernia repair   . Gout   . Glaucoma   . Hypertension     a. Patent renal arteries 2005.  [name] Kitchen Arthritis   . Headache(784.0)     " WHEN MY BLOOD PRESSURE IS UP"  . Other and unspecified hyperlipidemia   . OA (osteoarthritis)   . CAD (coronary artery disease)     a. Cath 08/2009: minimal nonobstructive disease (25% LAD). b. Lexiscan nuc 6/215: EF 54%, no ischemia or infarction.  [name] Kitchen History of acute renal failure   . Blindness of left eye   . CKD (chronic kidney disease) stage 3, GFR 30-59 ml/min 07/26/2012    a. Felt likely due to HTN.  . CVA (cerebral infarction) 07/28/2012  . HTN (hypertension)   . Chronic diastolic CHF (congestive heart failure)     a. Echo 2013: EF 55-60%, mild AI, mild MR. b. Echo 5/15 with EF 60-65%, mild LVH, mild AI.  [name] Kitchen  Noncompliance   . 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  Got adenosine from EMS in 2015 for SVT.  During 5/15 hospitalization had episode of short R-P tachycardia (possible AVNRT) terminated by adenosine.   [name] Kitchen History of alcohol abuse     MEDICATIONS:  Prior to Admission medications   Medication Sig Start Date End Date Taking? Authorizing Provider  acetaminophen (TYLENOL) 500 MG tablet Take 1,000 mg by mouth every 6 (six) hours as needed for headache.    Historical Provider, MD  allopurinol (ZYLOPRIM) 100 MG tablet Take 100 mg by mouth daily.    Historical Provider, MD  amLODipine (NORVASC) 10 MG tablet Take 1 tablet (10 mg total) by mouth daily. 03/12/14   Thurnell Lose, MD  aspirin EC 81 MG tablet Take 81 mg by mouth daily.    Historical Provider, MD  carvedilol (COREG) 12.5 MG tablet Take 6.25 mg by mouth 2 (two) times daily with a meal. 04/12/14   Charlynne Cousins, MD  cloNIDine (CATAPRES) 0.1 MG tablet Take 1 tablet (0.1 mg total) by mouth 2 (two) times daily. 03/12/14   Thurnell Lose, MD  colchicine 0.6 MG tablet Take 0.6 mg by mouth 2 (two) times daily.    Historical Provider, MD  furosemide (LASIX) 20 MG tablet Take 1 tablet (20 mg total) by mouth every other day. 02/12/14   Larey Dresser,  MD  hydrALAZINE (APRESOLINE) 100 MG tablet Take 1 tablet (100 mg total) by mouth every 8 (eight) hours. 12/18/13   Ripudeep Krystal Eaton, MD  isosorbide mononitrate (IMDUR) 30 MG 24 hr tablet Take 1 tablet (30 mg total) by mouth daily. 03/12/14   Thurnell Lose, MD  nitroGLYCERIN (NITROSTAT) 0.4 MG SL tablet Place 1 tablet (0.4 mg total) under the tongue every 5 (five) minutes as needed for chest pain. 12/21/13   Geradine Girt, DO  pantoprazole (PROTONIX) 40 MG tablet Take 1 tablet (40 mg total) by mouth daily. 04/12/14   Charlynne Cousins, MD  potassium chloride (K-DUR,KLOR-CON) 10 MEQ tablet Take 1 tablet (10 mEq total) by mouth every other day. 02/12/14   Larey Dresser, MD   rosuvastatin (CRESTOR) 5 MG tablet Take 1 tablet (5 mg total) by mouth daily. 04/20/14   Dayna N Dunn, PA-C    ALLERGIES:  No Known Allergies  SOCIAL HISTORY:  History  Substance Use Topics  . Smoking status: Never Smoker   . Smokeless tobacco: Never Used  . Alcohol Use: 0.6 oz/week    1 Cans of beer per week     Comment: statea pint of liquor last few days.    FAMILY HISTORY: Family History  Problem Relation Age of Onset  . CVA Mother   . Hypertension Sister   . Hypertension Brother   . Hypertension Brother     EXAM: BP 136/95  Pulse 93  Temp(Src) 97.8 F (36.6 C) (Oral)  Resp 19  Ht 5\' 10"  (1.778 m)  Wt 163 lb (73.936 kg)  BMI 23.39 kg/m2  SpO2 100% CONSTITUTIONAL: Alert and oriented and responds appropriately to questions. Well-appearing; well-nourished HEAD: Normocephalic EYES: Conjunctivae clear, PERRL ENT: normal nose; no rhinorrhea; moist mucous membranes; pharynx without lesions noted NECK: Supple, no meningismus, no LAD  CARD: RRR; S1 and S2 appreciated; no murmurs, no clicks, no rubs, no gallops RESP: Normal chest excursion without splinting or tachypnea; breath sounds clear and equal bilaterally; no wheezes, no rhonchi, no rales, , no respiratory distress or hypoxia ABD/GI: Normal bowel sounds; non-distended; soft, non-tender, no rebound, no guarding BACK:  The back appears normal and is non-tender to palpation, there is no CVA tenderness EXT: Normal ROM in all joints; non-tender to palpation; no edema; normal capillary refill; no cyanosis    SKIN: Normal color for age and race; warm NEURO: Moves all extremities equally, sensation to light touch intact diffusely, cranial nerves II through XII intact, no pronator drift PSYCH: The patient's mood and manner are appropriate. Grooming and personal hygiene are appropriate.  MEDICAL DECISION MAKING: Patient here with chest pain. He has a reported history of CAD with a 20% stenosis on cardiac catheterization in  2011. He is complaining of shortness of breath, nausea, dizziness and pain is worse with exertion. He does have risk factors for ACS. We'll give morphine for his headache and chest pain. His EKG shows improvement of T wave inversions in lateral leads compared to prior. First troponin negative. Chest x-ray clear. Given patient's recent negative stress test and recommendations for cardiology during a recent admission this month for medical management, will discuss with cardiology to evaluate for disposition. We'll repeat troponin at 1 AM, 6 hours after the onset of symptoms. Discussed with cardiology fellow who will see in the ED.  Signed out to Dr. Claudine Mouton.   EKG Interpretation  Date/Time:  Thursday April 26 2014 20:15:20 EDT Ventricular Rate:  112 PR Interval:  198 QRS  Duration: 84 QT Interval:  342 QTC Calculation: 466 R Axis:   34 Text Interpretation:  Sinus tachycardia with occasional Premature ventricular complexes Cannot rule out Inferior infarct , age undetermined Abnormal ECG Confirmed by Dandrae Kustra,  DO, Shamyia Grandpre [id number]) on 04/26/2014 11:11:59 PM           [place], DO 04/26/14 [id number]

## 2014-04-26 NOTE — ED Notes (Signed)
Initial triage charted by Jettie Pagan, RN but incorrectly charted under Maurice March.

## 2014-04-26 NOTE — ED Notes (Signed)
Pt reports chest pain starting around 1900 today. Reports SOB, nausea, and dizziness. Hx of afib.

## 2014-04-27 DIAGNOSIS — R079 Chest pain, unspecified: Secondary | ICD-10-CM

## 2014-04-27 LAB — I-STAT TROPONIN, ED: Troponin i, poc: 0.02 ng/mL (ref 0.00–0.08)

## 2014-04-27 NOTE — ED Provider Notes (Signed)
I was signed out this patient, h/o HTN, CHF, and LAD 25% on cath, here with CP and SOB at rest.  Recently admitted with neg stress test and cards is well aware of the patient.  The fellow evaluated the patient and believes medical management is still warranted.  Advised the patient for close follow up within 3 days.  Repeat troponin also negative.  Vitals remain within his normal limits and he is safe for discharge.    Everlene Balls, MD 04/27/14 [id number]

## 2014-04-27 NOTE — Discharge Instructions (Signed)
Chest Pain (Nonspecific) Mr. Fink, you were seen for chest pain.  Your heart markers were negative two times.  Cardiology wants to continue with medical management.  Please follow up with your cardiologist within 3 days for continued care.  Thank you. It is often hard to give a diagnosis for the cause of chest pain. There is always a chance that your pain could be related to something serious, such as a heart attack or a blood clot in the lungs. You need to follow up with your doctor. HOME CARE  If antibiotic medicine was given, take it as directed by your doctor. Finish the medicine even if you start to feel better.  For the next few days, avoid activities that bring on chest pain. Continue physical activities as told by your doctor.  Do not use any tobacco products. This includes cigarettes, chewing tobacco, and e-cigarettes.  Avoid drinking alcohol.  Only take medicine as told by your doctor.  Follow your doctor's suggestions for more testing if your chest pain does not go away.  Keep all doctor visits you made. GET HELP IF:  Your chest pain does not go away, even after treatment.  You have a rash with blisters on your chest.  You have a fever. GET HELP RIGHT AWAY IF:   You have more pain or pain that spreads to your arm, neck, jaw, back, or belly (abdomen).  You have shortness of breath.  You cough more than usual or cough up blood.  You have very bad back or belly pain.  You feel sick to your stomach (nauseous) or throw up (vomit).  You have very bad weakness.  You pass out (faint).  You have chills. This is an emergency. Do not wait to see if the problems will go away. Call your local emergency services (911 in [place] Do not drive yourself to the hospital. MAKE SURE YOU:   Understand these instructions.  Will watch your condition.  Will get help right away if you are not doing well or get worse. Document Released: 01/13/2008 Document Revised: 08/01/2013  Document Reviewed: 01/13/2008 [facility] Patient Information 2015 [place], [place]. This information is not intended to replace advice given to you by your health care provider. Make sure you discuss any questions you have with your health care provider.

## 2014-04-27 NOTE — Consult Note (Signed)
CARDIOLOGY CONSULT NOTE   Patient ID: Trevor Murphy MRN: [id number], DOB/AGE: 1935/03/22   Admit date: 04/26/2014 Date of Consult: 04/27/2014   Primary Physician: Maximino Greenland, MD Primary Cardiologist: Aundra Dubin  Reason for consult: Chest pain  Problem List  Past Medical History  Diagnosis Date  . Poor circulation   . H/O umbilical hernia repair   . Gout   . Glaucoma   . Hypertension     a. Patent renal arteries 2005.  [name] Kitchen Arthritis   . Headache(784.0)     " WHEN MY BLOOD PRESSURE IS UP"  . Other and unspecified hyperlipidemia   . OA (osteoarthritis)   . CAD (coronary artery disease)     a. Cath 08/2009: minimal nonobstructive disease (25% LAD). b. Lexiscan nuc 6/215: EF 54%, no ischemia or infarction.  [name] Kitchen History of acute renal failure   . Blindness of left eye   . CKD (chronic kidney disease) stage 3, GFR 30-59 ml/min 07/26/2012    a. Felt likely due to HTN.  . CVA (cerebral infarction) 07/28/2012  . HTN (hypertension)   . Chronic diastolic CHF (congestive heart failure)     a. Echo 2013: EF 55-60%, mild AI, mild MR. b. Echo 5/15 with EF 60-65%, mild LVH, mild AI.  [name] Kitchen Noncompliance   . 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  Got adenosine from EMS in 2015 for SVT.  During 5/15 hospitalization had episode of short R-P tachycardia (possible AVNRT) terminated by adenosine.   [name] Kitchen History of alcohol abuse     Past Surgical History  Procedure Laterality Date  . Hernia repair    . Ventral hernia repair  03/07/2012    Procedure: LAPAROSCOPIC VENTRAL HERNIA;  Surgeon: Gwenyth Ober, MD;  Location: [place];  Service: General;  Laterality: N/A;  . Appendectomy    . Cataract extraction extracapsular Right 05/17/2013    Procedure: CATARACT EXTRACTION EXTRACAPSULAR WITH INTRAOCULAR LENS PLACEMENT (IOC) RIGHT EYE;  Surgeon: Marylynn Pearson, MD;  Location: [place];  Service: Ophthalmology;  Laterality: Right;  . Cardiac catheterization  11/2003    EF 50-55%  . Cardiac  catheterization  08/2009     Allergies  No Known Allergies  HPI   The patient is a 79M with a history of HTN, HFpEF, CAD (prior 25% LAD though recent lexiscan showed no ischemic) presented to the ED with some left sided chest discomfort that started earlier this evening. Slightly worse on exertion but overall similar to his pain recently. He was seen in the ED by the cardiology team two weeks ago for similar pain. At that time, his CBM were negative x 3 and given his CKD, a conservative management strategy was preferred.  Today, he complained of similar pain initially. In the ED he was given a GI cocktail which improved his symptoms. When I saw him, he was pain-free, felt much better, and attributed his symptoms to dehydration.    Family History Family History  Problem Relation Age of Onset  . CVA Mother   . Hypertension Sister   . Hypertension Brother   . Hypertension Brother      Social History History   Social History  . Marital Status: Married    Spouse Name: N/A    Number of Children: N/A  . Years of Education: N/A   Occupational History  . Not on file.   Social History Main Topics  . Smoking status: Never Smoker   . Smokeless tobacco: Never Used  .  Alcohol Use: 0.6 oz/week    1 Cans of beer per week     Comment: statea pint of liquor last few days.  . Drug Use: No  . Sexual Activity: Not on file   Other Topics Concern  . Not on file   Social History Narrative  . No narrative on file     Review of Systems  General:  No chills, fever, night sweats or weight changes.  Cardiovascular:  No chest pain, dyspnea on exertion, edema, orthopnea, palpitations, paroxysmal nocturnal dyspnea. Dermatological: No rash, lesions/masses Respiratory: No cough, dyspnea Urologic: No hematuria, dysuria Abdominal:   No nausea, vomiting, diarrhea, bright red blood per rectum, melena, or hematemesis Neurologic:  No visual changes, wkns, changes in mental status. All other  systems reviewed and are otherwise negative except as noted above.  Physical Exam  Blood pressure 136/95, pulse 93, temperature 97.8 F (36.6 C), temperature source Oral, resp. rate 19, height 5\' 10"  (1.778 m), weight 163 lb (73.936 kg), SpO2 100.00%.  General: Pleasant, NAD, comfortable, sitting up in bed Psych: Normal affect. Neuro: Alert and oriented X 3. Moves all extremities spontaneously. HEENT: Normal  Neck: Supple without bruits, JVD 5cm. Lungs:  Resp regular and unlabored, CTA. Heart: RRR no s3, s4, or murmurs. Abdomen: Soft, non-tender, non-distended, BS + x 4.  Extremities: No clubbing, cyanosis or edema. DP/PT/Radials 2+ and equal bilaterally.  Labs  No results found for this basename: CKTOTAL, CKMB, TROPONINI,  in the last 72 hours Lab Results  Component Value Date   WBC 6.8 04/26/2014   HGB 17.1* 04/26/2014   HCT 49.1 04/26/2014   MCV 81.2 04/26/2014   PLT 205 04/26/2014    Recent Labs Lab 04/26/14 2022  NA 143  K 4.1  CL 100  CO2 23  BUN 15  CREATININE 1.68*  CALCIUM 9.7  GLUCOSE 122*   Lab Results  Component Value Date   CHOL 163 04/11/2014   HDL 39* 04/11/2014   LDLCALC 86 04/11/2014   TRIG 192* 04/11/2014   Radiology/Studies  Dg Chest 2 View  04/26/2014   CLINICAL DATA:  Shortness of breath. Chest pain. History of hypertension.  EXAM: CHEST  2 VIEW  COMPARISON:  03/11/2014  FINDINGS: Lungs are hyperinflated. The heart is normal in size. The aorta is tortuous. There are no focal consolidations or pleural effusions. No pulmonary edema.  IMPRESSION: 1. Hyperinflation. 2.  No evidence for acute  abnormality.   Electronically Signed   By: Shon Hale M.D.   On: 04/26/2014 21:47    ECG  ST@110bpm , first degree AVB, intermittent PVC, no acute ST-TW changes. His previous EKG revealed inferolateral ST-TW changes that have since resolved.  ASSESSMENT AND PLAN  79M with CAD, HfPEF presented to the ED with chest discomfort radiating to his shoulder that subsequently  resolved after morphine and zofran administration.   1. Chest pain: Unclear etiology but would lean away from clear cardiac etiology. His symptoms were somewhat atypical and resolved completely with 4mg  morphine and an antiemetic. he has had recent noninvasive imaging that did not show evidence of ischemia. His EKG is improved from previous. Regardless, would still continue to favor a conservative strategy given his CKD and the possibility of renal failure and dialysis following cath. -continue ASA -continue BB, CB, hydral/ISMN, statin -would not treat for ACS at this time. -would obtain at least 1 more cTn to rule out myocardial damage -followup with Dr. Aundra Dubin as an outpatient.  Signed, Raliegh Ip, MD MPH  04/27/2014, 1:54 AM

## 2014-06-28 ENCOUNTER — Encounter: Payer: Self-pay | Admitting: Physician Assistant

## 2014-07-07 ENCOUNTER — Encounter (HOSPITAL_COMMUNITY): Payer: Self-pay | Admitting: *Deleted

## 2014-07-07 DIAGNOSIS — Z79899 Other long term (current) drug therapy: Secondary | ICD-10-CM | POA: Diagnosis not present

## 2014-07-07 DIAGNOSIS — R51 Headache: Secondary | ICD-10-CM | POA: Diagnosis not present

## 2014-07-07 DIAGNOSIS — Z9119 Patient's noncompliance with other medical treatment and regimen: Secondary | ICD-10-CM | POA: Diagnosis not present

## 2014-07-07 DIAGNOSIS — M109 Gout, unspecified: Secondary | ICD-10-CM | POA: Diagnosis not present

## 2014-07-07 DIAGNOSIS — N183 Chronic kidney disease, stage 3 (moderate): Secondary | ICD-10-CM | POA: Diagnosis not present

## 2014-07-07 DIAGNOSIS — H538 Other visual disturbances: Secondary | ICD-10-CM | POA: Insufficient documentation

## 2014-07-07 DIAGNOSIS — M1990 Unspecified osteoarthritis, unspecified site: Secondary | ICD-10-CM | POA: Diagnosis not present

## 2014-07-07 DIAGNOSIS — I509 Heart failure, unspecified: Secondary | ICD-10-CM | POA: Insufficient documentation

## 2014-07-07 DIAGNOSIS — Z8673 Personal history of transient ischemic attack (TIA), and cerebral infarction without residual deficits: Secondary | ICD-10-CM | POA: Diagnosis not present

## 2014-07-07 DIAGNOSIS — I130 Hypertensive heart and chronic kidney disease with heart failure and stage 1 through stage 4 chronic kidney disease, or unspecified chronic kidney disease: Secondary | ICD-10-CM | POA: Diagnosis not present

## 2014-07-07 DIAGNOSIS — I2510 Atherosclerotic heart disease of native coronary artery without angina pectoris: Secondary | ICD-10-CM | POA: Insufficient documentation

## 2014-07-07 DIAGNOSIS — F1010 Alcohol abuse, uncomplicated: Secondary | ICD-10-CM | POA: Insufficient documentation

## 2014-07-07 DIAGNOSIS — Z7982 Long term (current) use of aspirin: Secondary | ICD-10-CM | POA: Diagnosis not present

## 2014-07-07 DIAGNOSIS — H5412 Blindness, left eye, low vision right eye: Secondary | ICD-10-CM | POA: Insufficient documentation

## 2014-07-07 DIAGNOSIS — E860 Dehydration: Secondary | ICD-10-CM | POA: Insufficient documentation

## 2014-07-07 LAB — I-STAT TROPONIN, ED: Troponin i, poc: 0.03 ng/mL (ref 0.00–0.08)

## 2014-07-07 LAB — COMPREHENSIVE METABOLIC PANEL
ALT: 124 U/L — ABNORMAL HIGH (ref 0–53)
AST: 274 U/L — ABNORMAL HIGH (ref 0–37)
Albumin: 3.4 g/dL — ABNORMAL LOW (ref 3.5–5.2)
Alkaline Phosphatase: 112 U/L (ref 39–117)
Anion gap: 17 — ABNORMAL HIGH (ref 5–15)
BUN: 21 mg/dL (ref 6–23)
CO2: 23 mEq/L (ref 19–32)
Calcium: 8.3 mg/dL — ABNORMAL LOW (ref 8.4–10.5)
Chloride: 97 mEq/L (ref 96–112)
Creatinine, Ser: 1.7 mg/dL — ABNORMAL HIGH (ref 0.50–1.35)
GFR calc Af Amer: 43 mL/min — ABNORMAL LOW (ref >90)
GFR calc non Af Amer: 37 mL/min — ABNORMAL LOW (ref >90)
Glucose, Bld: 117 mg/dL — ABNORMAL HIGH (ref 70–99)
Potassium: 3.4 mEq/L — ABNORMAL LOW (ref 3.7–5.3)
Sodium: 137 mEq/L (ref 137–147)
Total Bilirubin: 0.8 mg/dL (ref 0.3–1.2)
Total Protein: 7.5 g/dL (ref 6.0–8.3)

## 2014-07-07 LAB — CBC
HCT: 42.3 % (ref 39.0–52.0)
Hemoglobin: 14.2 g/dL (ref 13.0–17.0)
MCH: 27.1 pg (ref 26.0–34.0)
MCHC: 33.6 g/dL (ref 30.0–36.0)
MCV: 80.7 fL (ref 78.0–100.0)
Platelets: 184 10*3/uL (ref 150–400)
RBC: 5.24 MIL/uL (ref 4.22–5.81)
RDW: 17 % — ABNORMAL HIGH (ref 11.5–15.5)
WBC: 4.3 10*3/uL (ref 4.0–10.5)

## 2014-07-07 LAB — DIFFERENTIAL
Basophils Absolute: 0 10*3/uL (ref 0.0–0.1)
Basophils Relative: 1 % (ref 0–1)
Eosinophils Absolute: 0.1 10*3/uL (ref 0.0–0.7)
Eosinophils Relative: 2 % (ref 0–5)
Lymphocytes Relative: 30 % (ref 12–46)
Lymphs Abs: 1.3 10*3/uL (ref 0.7–4.0)
Monocytes Absolute: 0.4 10*3/uL (ref 0.1–1.0)
Monocytes Relative: 10 % (ref 3–12)
Neutro Abs: 2.5 10*3/uL (ref 1.7–7.7)
Neutrophils Relative %: 57 % (ref 43–77)

## 2014-07-07 LAB — APTT: aPTT: 29 seconds (ref 24–37)

## 2014-07-07 LAB — PROTIME-INR
INR: 1 (ref 0.00–1.49)
Prothrombin Time: 13.3 seconds (ref 11.6–15.2)

## 2014-07-07 NOTE — ED Notes (Signed)
Pt is here with dehydration.  Reports that he started having headache today and then started having problems seeing out of right eye all day.  Pt feels nauseated.  Pt is following commands.  No extremity or facial deficits

## 2014-07-08 ENCOUNTER — Emergency Department (HOSPITAL_COMMUNITY): Payer: Medicare Other

## 2014-07-08 ENCOUNTER — Emergency Department (HOSPITAL_COMMUNITY)
Admission: EM | Admit: 2014-07-08 | Discharge: 2014-07-08 | Disposition: A | Discharge status: Home / Self Care | Payer: Medicare Other | Attending: Emergency Medicine | Admitting: Emergency Medicine

## 2014-07-08 DIAGNOSIS — E860 Dehydration: Secondary | ICD-10-CM

## 2014-07-08 DIAGNOSIS — F1010 Alcohol abuse, uncomplicated: Secondary | ICD-10-CM

## 2014-07-08 LAB — URINALYSIS, ROUTINE W REFLEX MICROSCOPIC
Bilirubin Urine: NEGATIVE
Glucose, UA: NEGATIVE mg/dL
Hgb urine dipstick: NEGATIVE
Ketones, ur: NEGATIVE mg/dL
Leukocytes, UA: NEGATIVE
Nitrite: NEGATIVE
Protein, ur: NEGATIVE mg/dL
Specific Gravity, Urine: 1.01 (ref 1.005–1.030)
Urobilinogen, UA: 0.2 mg/dL (ref 0.0–1.0)
pH: 5.5 (ref 5.0–8.0)

## 2014-07-08 LAB — ETHANOL: Alcohol, Ethyl (B): <11 mg/dL (ref 0–11)

## 2014-07-08 LAB — CBG MONITORING, ED: Glucose-Capillary: 103 mg/dL — ABNORMAL HIGH (ref 70–99)

## 2014-07-08 MED ORDER — MORPHINE SULFATE 4 MG/ML IJ SOLN
4.0000 mg | Freq: Once | INTRAMUSCULAR | Status: AC
  Administered 2014-07-08: 4 mg via INTRAVENOUS
  Filled 2014-07-08: qty 1

## 2014-07-08 MED ORDER — SODIUM CHLORIDE 0.9 % IV BOLUS (SEPSIS)
1000.0000 mL | Freq: Once | INTRAVENOUS | Status: AC
Start: 2014-07-08 — End: 2014-07-08
  Administered 2014-07-08: 1000 mL via INTRAVENOUS

## 2014-07-08 MED ORDER — ONDANSETRON HCL 4 MG/2ML IJ SOLN
4.0000 mg | Freq: Once | INTRAMUSCULAR | Status: AC
  Administered 2014-07-08: 4 mg via INTRAVENOUS
  Filled 2014-07-08: qty 2

## 2014-07-08 MED ORDER — ONDANSETRON 4 MG PO TBDP: 4.0000 mg | ORAL_TABLET | Freq: Three times a day (TID) | ORAL | Status: DC | PRN

## 2014-07-08 NOTE — Discharge Instructions (Signed)
Please reduce the amount of alcohol that you drink. Your testing today has been unremarkable that does show that you have had some dehydration. Return to the hospital for severe or worsening symptoms, drink plenty of clear fluids (not alcohol) over the next couple of days to stay hydrated.  Please call your doctor for a followup appointment within 24-48 hours. When you talk to your doctor please let them know that you were seen in the emergency department and have them acquire all of your records so that they can discuss the findings with you and formulate a treatment plan to fully care for your new and ongoing problems.

## 2014-07-08 NOTE — ED Provider Notes (Signed)
CSN: [id number]     Arrival date & time 07/07/14  2217 History  This chart was scribed for Johnna Acosta, MD by Peyton Bottoms, ED Scribe. This patient was seen in room B14C/B14C and the patient's care was started at 1:03 AM.   Chief Complaint  Patient presents with  . Dehydration  . Headache    blood thinner  . Visual Field Change   Patient is a 79 y.o. male presenting with headaches. The history is provided by the patient. No language interpreter was used.  Headache  HPI Comments: Trevor Murphy is a 79 y.o. male with chronic visual problems, who presents to the Emergency Department complaining of dehydration, headache and visual problems with seeing out of right eye. He states he has had a decreased appetite for the past few days and increased intake of alcohol, which is related to the passing of his wife 1 month ago. He states he has been "drinking a little water and whiskey". He reports normal urine output, normal bowel movements. He reports that he is blind in his left eye and has had decreased vision in right eye which is consistent with baseline. He reports increased blurred vision in right eye. He states he had an operation on his right eye 3-4 months ago. He also reports onset of headache in the forehead area, consistent with prior headaches. He states he has onset of headache every time his BP increases. He describes he feels as if the room is spinning. He reports that he is ambulatory but feels weak. He walks with a cane. He states he has had increased intake of alcohol. Per son, patient has drank 3 quarts of whiskey in the past week. He denies history of heart problems.  Past Medical History  Diagnosis Date  . Poor circulation   . H/O umbilical hernia repair   . Gout   . Glaucoma   . Hypertension     a. Patent renal arteries 2005.  [name] Kitchen Arthritis   . Headache(784.0)     " WHEN MY BLOOD PRESSURE IS UP"  . Other and unspecified hyperlipidemia   . OA (osteoarthritis)   . CAD  (coronary artery disease)     a. Cath 08/2009: minimal nonobstructive disease (25% LAD). b. Lexiscan nuc 6/215: EF 54%, no ischemia or infarction.  [name] Kitchen History of acute renal failure   . Blindness of left eye   . CKD (chronic kidney disease) stage 3, GFR 30-59 ml/min 07/26/2012    a. Felt likely due to HTN.  . CVA (cerebral infarction) 07/28/2012  . HTN (hypertension)   . Chronic diastolic CHF (congestive heart failure)     a. Echo 2013: EF 55-60%, mild AI, mild MR. b. Echo 5/15 with EF 60-65%, mild LVH, mild AI.  [name] Kitchen Noncompliance   . 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  Got adenosine from EMS in 2015 for SVT.  During 5/15 hospitalization had episode of short R-P tachycardia (possible AVNRT) terminated by adenosine.   [name] Kitchen History of alcohol abuse    Past Surgical History  Procedure Laterality Date  . Hernia repair    . Ventral hernia repair  03/07/2012    Procedure: LAPAROSCOPIC VENTRAL HERNIA;  Surgeon: Gwenyth Ober, MD;  Location: [facility];  Service: General;  Laterality: N/A;  . Appendectomy    . Cataract extraction extracapsular Right 05/17/2013    Procedure: CATARACT EXTRACTION EXTRACAPSULAR WITH INTRAOCULAR LENS PLACEMENT (IOC) RIGHT EYE;  Surgeon: Marylynn Pearson, MD;  Location: [facility];  Service: Ophthalmology;  Laterality: Right;  . Cardiac catheterization  11/2003    EF 50-55%  . Cardiac catheterization  08/2009   Family History  Problem Relation Age of Onset  . CVA Mother   . Hypertension Sister   . Hypertension Brother   . Hypertension Brother    History  Substance Use Topics  . Smoking status: Never Smoker   . Smokeless tobacco: Never Used  . Alcohol Use: 0.6 oz/week    1 Cans of beer per week     Comment: Pt drank etoh yesterday and family reports heavily   Review of Systems  Constitutional: Positive for appetite change (decreased).  Neurological: Positive for headaches.   Allergies  Review of patient's allergies indicates no known allergies.  Home  Medications   Prior to Admission medications   Medication Sig Start Date End Date Taking? Authorizing Provider  acetaminophen (TYLENOL) 500 MG tablet Take 1,000 mg by mouth every 6 (six) hours as needed for headache.   Yes Historical Provider, MD  allopurinol (ZYLOPRIM) 100 MG tablet Take 100 mg by mouth daily.   Yes Historical Provider, MD  amLODipine (NORVASC) 10 MG tablet Take 1 tablet (10 mg total) by mouth daily. 03/12/14  Yes Thurnell Lose, MD  aspirin EC 81 MG tablet Take 81 mg by mouth daily.   Yes Historical Provider, MD  carvedilol (COREG) 12.5 MG tablet Take 6.25 mg by mouth 2 (two) times daily with a meal. 04/12/14  Yes Charlynne Cousins, MD  cloNIDine (CATAPRES) 0.1 MG tablet Take 1 tablet (0.1 mg total) by mouth 2 (two) times daily. 03/12/14  Yes Thurnell Lose, MD  colchicine 0.6 MG tablet Take 0.6 mg by mouth 2 (two) times daily.   Yes Historical Provider, MD  furosemide (LASIX) 20 MG tablet Take 1 tablet (20 mg total) by mouth every other day. 02/12/14  Yes Larey Dresser, MD  hydrALAZINE (APRESOLINE) 100 MG tablet Take 1 tablet (100 mg total) by mouth every 8 (eight) hours. 12/18/13  Yes Ripudeep Krystal Eaton, MD  isosorbide mononitrate (IMDUR) 30 MG 24 hr tablet Take 1 tablet (30 mg total) by mouth daily. 03/12/14  Yes Thurnell Lose, MD  nitroGLYCERIN (NITROSTAT) 0.4 MG SL tablet Place 1 tablet (0.4 mg total) under the tongue every 5 (five) minutes as needed for chest pain. 12/21/13  Yes Jessica U Vann, DO  pantoprazole (PROTONIX) 40 MG tablet Take 1 tablet (40 mg total) by mouth daily. 04/12/14  Yes Charlynne Cousins, MD  potassium chloride (K-DUR,KLOR-CON) 10 MEQ tablet Take 1 tablet (10 mEq total) by mouth every other day. 02/12/14  Yes Larey Dresser, MD  rosuvastatin (CRESTOR) 5 MG tablet Take 1 tablet (5 mg total) by mouth daily. 04/20/14  Yes Dayna N Dunn, PA-C  ondansetron (ZOFRAN ODT) 4 MG disintegrating tablet Take 1 tablet (4 mg total) by mouth every 8 (eight) hours as needed  for nausea. 07/08/14   Johnna Acosta, MD   Triage Vitals: BP 116/69 mmHg  Pulse 64  Temp(Src) 97.3 F (36.3 C) (Oral)  Resp 22  SpO2 99%  Physical Exam  Constitutional: He appears well-developed and well-nourished. No distress.  HENT:  Head: Normocephalic and atraumatic.  Mouth/Throat: Oropharynx is clear and moist. No oropharyngeal exudate.  Dry mucous membranes.  Eyes: Conjunctivae and EOM are normal. Pupils are equal, round, and reactive to light. Right eye exhibits no discharge. Left eye exhibits no discharge. No scleral icterus.  Neck:  Normal range of motion. Neck supple. No JVD present. No thyromegaly present.  Cardiovascular: Normal rate, regular rhythm, normal heart sounds and intact distal pulses.  Exam reveals no gallop and no friction rub.   No murmur heard. Pulmonary/Chest: Effort normal and breath sounds normal. No respiratory distress. He has no wheezes. He has no rales.  Abdominal: Soft. Bowel sounds are normal. He exhibits no distension and no mass. There is no tenderness.  Musculoskeletal: Normal range of motion. He exhibits no edema or tenderness.  Lymphadenopathy:    He has no cervical adenopathy.  Neurological: He is alert. Coordination normal.  Skin: Skin is warm and dry. No rash noted. No erythema.  Psychiatric: He has a normal mood and affect. His behavior is normal.  Nursing note and vitals reviewed.  ED Course  Procedures (including critical care time)  DIAGNOSTIC STUDIES: Oxygen Saturation is 99% on RA, normal by my interpretation.    COORDINATION OF CARE: 1:15 AM- Discussed plans to order diagnostic CT of head/brain. Will give patient Zofran, 4mg  morphine injection and IV fluids. Pt advised of plan for treatment and pt agrees.  Labs Review Labs Reviewed  CBC - Abnormal; Notable for the following:    RDW 17.0 (*)    All other components within normal limits  COMPREHENSIVE METABOLIC PANEL - Abnormal; Notable for the following:    Potassium 3.4 (*)     Glucose, Bld 117 (*)    Creatinine, Ser 1.70 (*)    Calcium 8.3 (*)    Albumin 3.4 (*)    AST 274 (*)    ALT 124 (*)    GFR calc non Af Amer 37 (*)    GFR calc Af Amer 43 (*)    Anion gap 17 (*)    All other components within normal limits  PROTIME-INR  APTT  DIFFERENTIAL  ETHANOL  URINALYSIS, ROUTINE W REFLEX MICROSCOPIC  CBG MONITORING, ED  I-STAT TROPOININ, ED   Imaging Review Ct Head (brain) Wo Contrast  07/08/2014   CLINICAL DATA:  Dehydration, headache beginning today with RIGHT eye vision changes. Nausea.  EXAM: CT HEAD WITHOUT CONTRAST  TECHNIQUE: Contiguous axial images were obtained from the base of the skull through the vertex without intravenous contrast.  COMPARISON:  MRI of the brain February 23, 2014  FINDINGS: The ventricles and sulci are normal for age. No intraparenchymal hemorrhage, mass effect nor midline shift. Patchy to confluent supratentorial white matter hypodensities are similar. No acute large vascular territory infarcts. Small remote RIGHT cerebellar infarct.  No abnormal extra-axial fluid collections. Basal cisterns are patent. Mild calcific atherosclerosis of the carotid siphons.  No skull fracture. The included ocular globes and orbital contents are non-suspicious. Status post RIGHT ocular lens implant. RIGHT sphenoid mucosal thickening with small air-fluid level, pneumatized RIGHT pterygoid body.  IMPRESSION: No acute intracranial process.  Acute on chronic RIGHT sphenoid sinusitis.  Involutional changes. Moderate to severe white matter changes suggest chronic small vessel ischemic disease. Stable remote RIGHT cerebellar infarct.   Electronically Signed   By: Elon Alas   On: 07/08/2014 01:57     EKG Interpretation   Date/Time:  Saturday July 07 2014 22:42:57 EST Ventricular Rate:  59 PR Interval:  236 QRS Duration: 98 QT Interval:  496 QTC Calculation: 491 R Axis:   36 Text Interpretation:  Sinus bradycardia with 1st degree A-V block   Nonspecific ST and T wave abnormality Prolonged QT Abnormal ECG Since last  tracing rate slower Confirmed by Sabra Heck  MD, Sylvanus Telford (  [id number]) on 07/08/2014  1:11:36 AM     MDM   Final diagnoses:  Dehydration  Alcohol abuse   Ambulated without difficulty, labs confirm that the patient has a transaminitis likely from his increased alcohol use, no significant lab abnormalities otherwise, urinalysis clean, no significant dehydration found, normal renal function for the patient at baseline. Patient has been hydrated, ambulated, tolerated by mouth and states that he feels good and wants to go home.   Meds given in ED:  Medications  sodium chloride 0.9 % bolus 1,000 mL (0 mLs Intravenous Stopped 07/08/14 0406)  ondansetron (ZOFRAN) injection 4 mg (4 mg Intravenous Given 07/08/14 0143)  morphine 4 MG/ML injection 4 mg (4 mg Intravenous Given 07/08/14 0143)    New Prescriptions   ONDANSETRON (ZOFRAN ODT) 4 MG DISINTEGRATING TABLET    Take 1 tablet (4 mg total) by mouth every 8 (eight) hours as needed for nausea.      I personally performed the services described in this documentation, which was scribed in my presence. The recorded information has been reviewed and is accurate.  Johnna Acosta, MD 07/08/14 [phone]

## 2014-07-10 ENCOUNTER — Encounter (HOSPITAL_COMMUNITY): Payer: Self-pay | Admitting: Emergency Medicine

## 2014-07-10 DIAGNOSIS — I129 Hypertensive chronic kidney disease with stage 1 through stage 4 chronic kidney disease, or unspecified chronic kidney disease: Secondary | ICD-10-CM | POA: Insufficient documentation

## 2014-07-10 DIAGNOSIS — Z7982 Long term (current) use of aspirin: Secondary | ICD-10-CM | POA: Diagnosis not present

## 2014-07-10 DIAGNOSIS — Z8669 Personal history of other diseases of the nervous system and sense organs: Secondary | ICD-10-CM | POA: Insufficient documentation

## 2014-07-10 DIAGNOSIS — R197 Diarrhea, unspecified: Secondary | ICD-10-CM | POA: Insufficient documentation

## 2014-07-10 DIAGNOSIS — Z79899 Other long term (current) drug therapy: Secondary | ICD-10-CM | POA: Insufficient documentation

## 2014-07-10 DIAGNOSIS — M1990 Unspecified osteoarthritis, unspecified site: Secondary | ICD-10-CM | POA: Diagnosis not present

## 2014-07-10 DIAGNOSIS — Z9119 Patient's noncompliance with other medical treatment and regimen: Secondary | ICD-10-CM | POA: Diagnosis not present

## 2014-07-10 DIAGNOSIS — I2510 Atherosclerotic heart disease of native coronary artery without angina pectoris: Secondary | ICD-10-CM | POA: Diagnosis not present

## 2014-07-10 DIAGNOSIS — N179 Acute kidney failure, unspecified: Secondary | ICD-10-CM | POA: Diagnosis not present

## 2014-07-10 DIAGNOSIS — N183 Chronic kidney disease, stage 3 (moderate): Secondary | ICD-10-CM | POA: Diagnosis not present

## 2014-07-10 DIAGNOSIS — I252 Old myocardial infarction: Secondary | ICD-10-CM | POA: Diagnosis not present

## 2014-07-10 DIAGNOSIS — I5032 Chronic diastolic (congestive) heart failure: Secondary | ICD-10-CM | POA: Insufficient documentation

## 2014-07-10 DIAGNOSIS — M109 Gout, unspecified: Secondary | ICD-10-CM | POA: Diagnosis not present

## 2014-07-10 DIAGNOSIS — Z8719 Personal history of other diseases of the digestive system: Secondary | ICD-10-CM | POA: Insufficient documentation

## 2014-07-10 NOTE — ED Notes (Signed)
Pt. reports diarrhea for 2 days with generalized weakness /fatigue and poor appetite .

## 2014-07-11 ENCOUNTER — Emergency Department (HOSPITAL_COMMUNITY)
Admission: EM | Admit: 2014-07-11 | Discharge: 2014-07-11 | Disposition: A | Discharge status: Home / Self Care | Payer: Medicare Other | Attending: Emergency Medicine | Admitting: Emergency Medicine

## 2014-07-11 ENCOUNTER — Ambulatory Visit: Payer: Self-pay | Admitting: Physician Assistant

## 2014-07-11 DIAGNOSIS — R197 Diarrhea, unspecified: Secondary | ICD-10-CM

## 2014-07-11 LAB — COMPREHENSIVE METABOLIC PANEL
ALT: 155 U/L — ABNORMAL HIGH (ref 0–53)
AST: 279 U/L — ABNORMAL HIGH (ref 0–37)
Albumin: 3.3 g/dL — ABNORMAL LOW (ref 3.5–5.2)
Alkaline Phosphatase: 122 U/L — ABNORMAL HIGH (ref 39–117)
Anion gap: 14 (ref 5–15)
BUN: 18 mg/dL (ref 6–23)
CO2: 25 mEq/L (ref 19–32)
Calcium: 9 mg/dL (ref 8.4–10.5)
Chloride: 101 mEq/L (ref 96–112)
Creatinine, Ser: 1.36 mg/dL — ABNORMAL HIGH (ref 0.50–1.35)
GFR calc Af Amer: 56 mL/min — ABNORMAL LOW (ref >90)
GFR calc non Af Amer: 49 mL/min — ABNORMAL LOW (ref >90)
Glucose, Bld: 114 mg/dL — ABNORMAL HIGH (ref 70–99)
Potassium: 3.9 mEq/L (ref 3.7–5.3)
Sodium: 140 mEq/L (ref 137–147)
Total Bilirubin: 0.8 mg/dL (ref 0.3–1.2)
Total Protein: 7.4 g/dL (ref 6.0–8.3)

## 2014-07-11 LAB — CBC WITH DIFFERENTIAL/PLATELET
Basophils Absolute: 0 10*3/uL (ref 0.0–0.1)
Basophils Relative: 1 % (ref 0–1)
Eosinophils Absolute: 0.1 10*3/uL (ref 0.0–0.7)
Eosinophils Relative: 2 % (ref 0–5)
HCT: 44.4 % (ref 39.0–52.0)
Hemoglobin: 15.1 g/dL (ref 13.0–17.0)
Lymphocytes Relative: 26 % (ref 12–46)
Lymphs Abs: 1.1 10*3/uL (ref 0.7–4.0)
MCH: 27.8 pg (ref 26.0–34.0)
MCHC: 34 g/dL (ref 30.0–36.0)
MCV: 81.6 fL (ref 78.0–100.0)
Monocytes Absolute: 0.5 10*3/uL (ref 0.1–1.0)
Monocytes Relative: 12 % (ref 3–12)
Neutro Abs: 2.5 10*3/uL (ref 1.7–7.7)
Neutrophils Relative %: 59 % (ref 43–77)
Platelets: 190 10*3/uL (ref 150–400)
RBC: 5.44 MIL/uL (ref 4.22–5.81)
RDW: 17.5 % — ABNORMAL HIGH (ref 11.5–15.5)
WBC: 4.1 10*3/uL (ref 4.0–10.5)

## 2014-07-11 LAB — LIPASE, BLOOD: Lipase: 35 U/L (ref 11–59)

## 2014-07-11 MED ORDER — SODIUM CHLORIDE 0.9 % IV BOLUS (SEPSIS)
1000.0000 mL | Freq: Once | INTRAVENOUS | Status: AC
  Administered 2014-07-11: 1000 mL via INTRAVENOUS

## 2014-07-11 NOTE — Discharge Instructions (Signed)

## 2014-07-11 NOTE — ED Provider Notes (Signed)
CSN: [id number]     Arrival date & time 07/10/14  2333 History  This chart was scribed for [employer]. Trevor Chapel, MD by Randa Evens, ED Scribe. This patient was seen in room A01C/A01C and the patient's care was started at 1:32 AM.      Chief Complaint  Patient presents with  . Diarrhea   Patient is a 79 y.o. male presenting with diarrhea. The history is provided by the patient. No language interpreter was used.  Diarrhea Associated symptoms: headaches and vomiting    HPI Comments: Trevor Murphy is a 79 y.o. male who presents to the Emergency Department complaining of dehydration onset 3 days ago. He states that he has drank about 2 quarts of alcohol. He states that the last time he had any alcohol was 3 days ago.  He states that he has associated HA, diarrhea, nausea and vomiting. He denies cough, CP or leg swelling.     Past Medical History  Diagnosis Date  . Poor circulation   . H/O umbilical hernia repair   . Gout   . Glaucoma   . Hypertension     a. Patent renal arteries 2005.  [name] Kitchen Arthritis   . Headache(784.0)     " WHEN MY BLOOD PRESSURE IS UP"  . Other and unspecified hyperlipidemia   . OA (osteoarthritis)   . CAD (coronary artery disease)     a. Cath 08/2009: minimal nonobstructive disease (25% LAD). b. Lexiscan nuc 6/215: EF 54%, no ischemia or infarction.  [name] Kitchen History of acute renal failure   . Blindness of left eye   . CKD (chronic kidney disease) stage 3, GFR 30-59 ml/min 07/26/2012    a. Felt likely due to HTN.  . CVA (cerebral infarction) 07/28/2012  . HTN (hypertension)   . Chronic diastolic CHF (congestive heart failure)     a. Echo 2013: EF 55-60%, mild AI, mild MR. b. Echo 5/15 with EF 60-65%, mild LVH, mild AI.  [name] Kitchen Noncompliance   . 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  Got adenosine from EMS in 2015 for SVT.  During 5/15 hospitalization had episode of short R-P tachycardia (possible AVNRT) terminated by adenosine.   [name] Kitchen History of alcohol  abuse    Past Surgical History  Procedure Laterality Date  . Hernia repair    . Ventral hernia repair  03/07/2012    Procedure: LAPAROSCOPIC VENTRAL HERNIA;  Surgeon: Gwenyth Ober, MD;  Location: [facility];  Service: General;  Laterality: N/A;  . Appendectomy    . Cataract extraction extracapsular Right 05/17/2013    Procedure: CATARACT EXTRACTION EXTRACAPSULAR WITH INTRAOCULAR LENS PLACEMENT (IOC) RIGHT EYE;  Surgeon: Marylynn Pearson, MD;  Location: [facility];  Service: Ophthalmology;  Laterality: Right;  . Cardiac catheterization  11/2003    EF 50-55%  . Cardiac catheterization  08/2009   Family History  Problem Relation Age of Onset  . CVA Mother   . Hypertension Sister   . Hypertension Brother   . Hypertension Brother    History  Substance Use Topics  . Smoking status: Never Smoker   . Smokeless tobacco: Never Used  . Alcohol Use: 0.6 oz/week    1 Cans of beer per week     Comment: Pt drank etoh yesterday and family reports heavily    Review of Systems  Respiratory: Negative for cough.   Cardiovascular: Negative for chest pain and leg swelling.  Gastrointestinal: Positive for nausea, vomiting and diarrhea.  Neurological: Positive for  headaches.     Allergies  Review of patient's allergies indicates no known allergies.  Home Medications   Prior to Admission medications   Medication Sig Start Date End Date Taking? Authorizing Provider  acetaminophen (TYLENOL) 500 MG tablet Take 1,000 mg by mouth every 6 (six) hours as needed for headache.   Yes Historical Provider, MD  allopurinol (ZYLOPRIM) 100 MG tablet Take 100 mg by mouth daily.   Yes Historical Provider, MD  amLODipine (NORVASC) 10 MG tablet Take 1 tablet (10 mg total) by mouth daily. 03/12/14  Yes Thurnell Lose, MD  aspirin EC 81 MG tablet Take 81 mg by mouth daily.   Yes Historical Provider, MD  carvedilol (COREG) 12.5 MG tablet Take 6.25 mg by mouth 2 (two) times daily with a meal. 04/12/14  Yes Charlynne Cousins, MD   cloNIDine (CATAPRES) 0.1 MG tablet Take 1 tablet (0.1 mg total) by mouth 2 (two) times daily. 03/12/14  Yes Thurnell Lose, MD  colchicine 0.6 MG tablet Take 0.6 mg by mouth 2 (two) times daily.   Yes Historical Provider, MD  furosemide (LASIX) 20 MG tablet Take 1 tablet (20 mg total) by mouth every other day. 02/12/14  Yes Larey Dresser, MD  hydrALAZINE (APRESOLINE) 100 MG tablet Take 1 tablet (100 mg total) by mouth every 8 (eight) hours. 12/18/13  Yes Ripudeep Krystal Eaton, MD  isosorbide mononitrate (IMDUR) 30 MG 24 hr tablet Take 1 tablet (30 mg total) by mouth daily. 03/12/14  Yes Thurnell Lose, MD  nitroGLYCERIN (NITROSTAT) 0.4 MG SL tablet Place 1 tablet (0.4 mg total) under the tongue every 5 (five) minutes as needed for chest pain. 12/21/13  Yes Jessica U Vann, DO  pantoprazole (PROTONIX) 40 MG tablet Take 1 tablet (40 mg total) by mouth daily. 04/12/14  Yes Charlynne Cousins, MD  potassium chloride (K-DUR,KLOR-CON) 10 MEQ tablet Take 1 tablet (10 mEq total) by mouth every other day. 02/12/14  Yes Larey Dresser, MD  rosuvastatin (CRESTOR) 5 MG tablet Take 1 tablet (5 mg total) by mouth daily. 04/20/14  Yes Dayna N Dunn, PA-C  ondansetron (ZOFRAN ODT) 4 MG disintegrating tablet Take 1 tablet (4 mg total) by mouth every 8 (eight) hours as needed for nausea. Patient not taking: Reported on 07/11/2014 07/08/14   Johnna Acosta, MD   BP 135/83 mmHg  Pulse 87  Temp(Src) 97.6 F (36.4 C)  Resp 10  Ht 5\' 10"  (1.778 m)  Wt 164 lb (74.39 kg)  BMI 23.53 kg/m2  SpO2 96%   Physical Exam  Constitutional: He is oriented to person, place, and time. He appears well-developed and well-nourished. No distress.  HENT:  Head: Normocephalic and atraumatic.  Dry mucous membranes   Eyes: Conjunctivae and EOM are normal.  Neck: Neck supple. No tracheal deviation present.  Cardiovascular: Normal rate.   Pulmonary/Chest: Effort normal. No respiratory distress.  Musculoskeletal: Normal range of motion.  No  peripheral edema   Neurological: He is alert and oriented to person, place, and time.  Skin: Skin is warm and dry.  Psychiatric: He has a normal mood and affect. His behavior is normal.  Nursing note and vitals reviewed.   ED Course  Procedures (including critical care time) Labs Review Labs Reviewed  CBC WITH DIFFERENTIAL - Abnormal; Notable for the following:    RDW 17.5 (*)    All other components within normal limits  COMPREHENSIVE METABOLIC PANEL - Abnormal; Notable for the following:    Glucose,  Bld 114 (*)    Creatinine, Ser 1.36 (*)    Albumin 3.3 (*)    AST 279 (*)    ALT 155 (*)    Alkaline Phosphatase 122 (*)    GFR calc non Af Amer 49 (*)    GFR calc Af Amer 56 (*)    All other components within normal limits  LIPASE, BLOOD    Imaging Review No results found.   EKG Interpretation None      MDM   Final diagnoses:  Diarrhea   patient states she is dehydrated. He's been having some diarrhea. States that he is dehydrated because he's been drinking alcohol. States he is not drinking alcohol in 3 days. Mucous membranes are somewhat dry but otherwise labs are where they were a couple days ago when he was seen for the same thing. Patient was given IV fluids and his tolerate orals. He will be discharged home to follow-up with his primary care doctor as needed.   I personally performed the services described in this documentation, which was scribed in my presence. The recorded information has been reviewed and is accurate.       Jasper Riling. Trevor Chapel, MD 07/11/14 [phone]

## 2014-07-11 NOTE — ED Notes (Signed)
This RN gave the pt a glass of water for his fluid challenge.

## 2014-07-11 NOTE — ED Notes (Signed)
Pt is tolerating water well.

## 2014-07-11 NOTE — ED Notes (Signed)
Pt states that he is dehydrated due to drinking alcohol. Pt denies drinking today. Pt reports that he hasn't had any ETOH since Friday.

## 2014-07-11 NOTE — ED Notes (Signed)
Pick[place], MD at bedside.  

## 2014-07-25 ENCOUNTER — Ambulatory Visit (INDEPENDENT_AMBULATORY_CARE_PROVIDER_SITE_OTHER): Payer: Medicare Other | Admitting: Physician Assistant

## 2014-07-25 ENCOUNTER — Telehealth: Payer: Self-pay | Admitting: *Deleted

## 2014-07-25 ENCOUNTER — Encounter: Payer: Self-pay | Admitting: Physician Assistant

## 2014-07-25 VITALS — BP 150/71 | HR 62 | Ht 70.0 in | Wt 156.0 lb

## 2014-07-25 DIAGNOSIS — N183 Chronic kidney disease, stage 3 unspecified: Secondary | ICD-10-CM

## 2014-07-25 DIAGNOSIS — E785 Hyperlipidemia, unspecified: Secondary | ICD-10-CM

## 2014-07-25 DIAGNOSIS — F1010 Alcohol abuse, uncomplicated: Secondary | ICD-10-CM

## 2014-07-25 DIAGNOSIS — I10 Essential (primary) hypertension: Secondary | ICD-10-CM

## 2014-07-25 DIAGNOSIS — I5032 Chronic diastolic (congestive) heart failure: Secondary | ICD-10-CM

## 2014-07-25 DIAGNOSIS — I471 Supraventricular tachycardia: Secondary | ICD-10-CM

## 2014-07-25 DIAGNOSIS — I2510 Atherosclerotic heart disease of native coronary artery without angina pectoris: Secondary | ICD-10-CM

## 2014-07-25 LAB — BASIC METABOLIC PANEL
BUN: 18 mg/dL (ref 6–23)
CO2: 24 mEq/L (ref 19–32)
Calcium: 9.1 mg/dL (ref 8.4–10.5)
Chloride: 104 mEq/L (ref 96–112)
Creatinine, Ser: 1.7 mg/dL — ABNORMAL HIGH (ref 0.4–1.5)
GFR: 49.79 mL/min — ABNORMAL LOW (ref >60.00)
Glucose, Bld: 103 mg/dL — ABNORMAL HIGH (ref 70–99)
Potassium: 4.3 mEq/L (ref 3.5–5.1)
Sodium: 135 mEq/L (ref 135–145)

## 2014-07-25 LAB — HEPATIC FUNCTION PANEL
ALT: 39 U/L (ref 0–53)
AST: 53 U/L — ABNORMAL HIGH (ref 0–37)
Albumin: 4.2 g/dL (ref 3.5–5.2)
Alkaline Phosphatase: 112 U/L (ref 39–117)
Bilirubin, Direct: 0.1 mg/dL (ref 0.0–0.3)
Total Bilirubin: 0.8 mg/dL (ref 0.2–1.2)
Total Protein: 8.3 g/dL (ref 6.0–8.3)

## 2014-07-25 LAB — BRAIN NATRIURETIC PEPTIDE: Pro B Natriuretic peptide (BNP): 39 pg/mL (ref 0.0–100.0)

## 2014-07-25 NOTE — Progress Notes (Signed)
Cardiology Office Note   Date:  07/25/2014   ID:  Trevor Murphy, DOB 1935-03-26, MRN [id number]  PCP:  Maximino Greenland, MD  Cardiologist:  Dr. Loralie Champagne     History of Present Illness: Trevor Murphy is a 79 y.o. male with a history of diastolic CHF, SVT (demand ischemia noted in 12/2013 with normal stress test 01/2014), nonobstructive CAD, HTN, CKD, prior stroke, left eye blindness, prior EtOH abuse. Last seen in this office in 9/15 after an admission to the hospital with headache, chest pain, near syncope, nausea and vomiting. He did have some ECG changes with new inf-lat TWI. Cardiology recommended medical therapy given high risk of contrast-induced nephropathy in the setting of CKD.    He returns for follow-up. He is here with his daughter. He continues to note dyspnea with exertion. This is chronic without significant change. He is NYHA 2b-3. He denies chest pain, syncope, orthopnea, PND or edema. He denies cough or wheezing.   Studies:   - LHC (1/11):  Proximal LAD 25%  - Echo (5/15):  Mild LVH, EF 60-65%, normal wall motion, mild AI  - Nuclear (6/15):  No ischemia, EF 54%, normal study  - Carotid US (12/13):  No significant ICA stenosis   Recent Labs: 04/11/2014: LDL (calc) 86; TSH 1.030 04/26/2014: Pro B Natriuretic peptide (BNP) 504.8* 07/11/2014: ALT 155*; BUN 18; Creatinine 1.36*; Hemoglobin 15.1; Potassium 3.9; Sodium 140    Recent Radiology:     Wt Readings from Last 3 Encounters:  07/25/14 156 lb (70.761 kg)  07/10/14 164 lb (74.39 kg)  04/26/14 163 lb (73.936 kg)     Past Medical History  Diagnosis Date  . Poor circulation   . H/O umbilical hernia repair   . Gout   . Glaucoma   . Hypertension     a. Patent renal arteries 2005.  [name] Kitchen Arthritis   . Headache(784.0)     " WHEN MY BLOOD PRESSURE IS UP"  . Other and unspecified hyperlipidemia   . OA (osteoarthritis)   . CAD (coronary artery disease)     a. Cath 08/2009: minimal nonobstructive disease (25%  LAD). b. Lexiscan nuc 6/215: EF 54%, no ischemia or infarction.  [name] Kitchen History of acute renal failure   . Blindness of left eye   . CKD (chronic kidney disease) stage 3, GFR 30-59 ml/min 07/26/2012    a. Felt likely due to HTN.  . CVA (cerebral infarction) 07/28/2012  . HTN (hypertension)   . Chronic diastolic CHF (congestive heart failure)     a. Echo 2013: EF 55-60%, mild AI, mild MR. b. Echo 5/15 with EF 60-65%, mild LVH, mild AI.  [name] Kitchen Noncompliance   . 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  Got adenosine from EMS in 2015 for SVT.  During 5/15 hospitalization had episode of short R-P tachycardia (possible AVNRT) terminated by adenosine.   [name] Kitchen History of alcohol abuse     Current Outpatient Prescriptions  Medication Sig Dispense Refill  . acetaminophen (TYLENOL) 500 MG tablet Take 1,000 mg by mouth every 6 (six) hours as needed for headache.    . allopurinol (ZYLOPRIM) 100 MG tablet Take 100 mg by mouth daily.    [name] Kitchen amLODipine (NORVASC) 10 MG tablet Take 1 tablet (10 mg total) by mouth daily. 30 tablet 0  . aspirin EC 81 MG tablet Take 81 mg by mouth daily.    . carvedilol (COREG) 12.5 MG tablet Take 6.25 mg by mouth 2 (  two) times daily with a meal.    . cloNIDine (CATAPRES) 0.1 MG tablet Take 1 tablet (0.1 mg total) by mouth 2 (two) times daily. 60 tablet 0  . colchicine 0.6 MG tablet Take 0.6 mg by mouth 2 (two) times daily.    . furosemide (LASIX) 20 MG tablet Take 1 tablet (20 mg total) by mouth every other day. 30 tablet 3  . hydrALAZINE (APRESOLINE) 100 MG tablet Take 1 tablet (100 mg total) by mouth every 8 (eight) hours. 90 tablet 5  . isosorbide mononitrate (IMDUR) 30 MG 24 hr tablet Take 1 tablet (30 mg total) by mouth daily. 30 tablet 0  . nitroGLYCERIN (NITROSTAT) 0.4 MG SL tablet Place 1 tablet (0.4 mg total) under the tongue every 5 (five) minutes as needed for chest pain. 30 tablet 0  . ondansetron (ZOFRAN ODT) 4 MG disintegrating tablet Take 1 tablet (4 mg  total) by mouth every 8 (eight) hours as needed for nausea. 10 tablet 0  . pantoprazole (PROTONIX) 40 MG tablet Take 1 tablet (40 mg total) by mouth daily. 60 tablet 0  . potassium chloride (K-DUR,KLOR-CON) 10 MEQ tablet Take 1 tablet (10 mEq total) by mouth every other day. 30 tablet 2  . rosuvastatin (CRESTOR) 5 MG tablet Take 1 tablet (5 mg total) by mouth daily. 30 tablet 6   No current facility-administered medications for this visit.     Allergies:   Review of patient's allergies indicates no known allergies.   Social History:  The patient  reports that he has never smoked. He has never used smokeless tobacco. He reports that he drinks about 0.6 oz of alcohol per week. He reports that he does not use illicit drugs.   Family History:  The patient's family history includes CVA in his mother; Hypertension in his brother, brother, and sister; Stroke in his sister. There is no history of Heart attack.    ROS:  Please see the history of present illness.   + diarrhea no bleeding no wheezing no cough   All other systems reviewed and negative.    PHYSICAL EXAM: VS:  BP 150/71 mmHg  Pulse 62  Ht 5\' 10"  (1.778 m)  Wt 156 lb (70.761 kg)  BMI 22.38 kg/m2 Well nourished, well developed, in no acute distress HEENT: normal Neck:  no JVD Cardiac:  normal S1, S2;  RRR; no murmur   Lungs:   clear to auscultation bilaterally, no wheezing, rhonchi or rales Abd: soft, nontender, no hepatomegaly Ext:  no edema Skin: warm and dry Neuro:  CNs 2-12 intact, no focal abnormalities noted  EKG:  NSR, HR 62, 1 st degree AVB, inf-lat TWI, QTc 460 ms      ASSESSMENT AND PLAN:   1.  Chronic Diastolic CHF:  Volume appears stable. He does report increased dyspnea.    -  Check BMET, BNP. Adjust Lasix if necessary. 2.  CAD:  No significant CAD by cardiac catheterization 2011. Recent nuclear study low risk without ischemia.    -  Continue ASA, beta blocker, nitrates, statin. 3.  Hypertension:  Blood  pressure elevated today. However, he has not yet taken his medications. 4.  Chronic Kidney Disease:  Check follow-up BMET today. 5.  PSVT:  No apparent recurrence. Continue beta blocker. 6.  ETOH Abuse:  He continues to drink at times. He knows that he needs to quit. 7.  Hyperlipidemia:   Recent LFTs elevated in the setting of ETOH use.  Repeat LFTs today.  If  still elevated, may need to hold Crestor.    Disposition:   FU with Dr. Loralie Champagne 4 mos.   Signed, Versie Starks, MHS 07/25/2014 10:34 AM    [facility] [place], [place], [place]  [address] Phone: [phone]; Fax: [phone]

## 2014-07-25 NOTE — Telephone Encounter (Signed)
pt notified about lab results with verbal understanding  

## 2014-07-25 NOTE — Patient Instructions (Addendum)
LAB WORK TODAY; BMET,BNP, LFT  Your physician recommends that you schedule a follow-up appointment in: [place]  Your physician recommends that you continue on your current medications as directed. Please refer to the Current Medication list given to you today.

## 2014-11-23 ENCOUNTER — Ambulatory Visit: Payer: Self-pay | Admitting: Cardiology

## 2015-01-31 ENCOUNTER — Encounter: Payer: Self-pay | Admitting: Cardiology

## 2015-01-31 ENCOUNTER — Ambulatory Visit (INDEPENDENT_AMBULATORY_CARE_PROVIDER_SITE_OTHER): Payer: Medicare Other | Admitting: Cardiology

## 2015-01-31 VITALS — BP 146/82 | HR 78 | Ht 70.0 in | Wt 169.0 lb

## 2015-01-31 DIAGNOSIS — I10 Essential (primary) hypertension: Secondary | ICD-10-CM

## 2015-01-31 DIAGNOSIS — I471 Supraventricular tachycardia, unspecified: Secondary | ICD-10-CM

## 2015-01-31 DIAGNOSIS — I5032 Chronic diastolic (congestive) heart failure: Secondary | ICD-10-CM

## 2015-01-31 LAB — BASIC METABOLIC PANEL
BUN: 13 mg/dL (ref 6–23)
CO2: 27 mEq/L (ref 19–32)
Calcium: 9.1 mg/dL (ref 8.4–10.5)
Chloride: 101 mEq/L (ref 96–112)
Creatinine, Ser: 1.59 mg/dL — ABNORMAL HIGH (ref 0.40–1.50)
GFR: 54.44 mL/min — ABNORMAL LOW (ref >60.00)
Glucose, Bld: 99 mg/dL (ref 70–99)
Potassium: 4.6 mEq/L (ref 3.5–5.1)
Sodium: 134 mEq/L — ABNORMAL LOW (ref 135–145)

## 2015-01-31 MED ORDER — CARVEDILOL 12.5 MG PO TABS: 12.5000 mg | ORAL_TABLET | Freq: Two times a day (BID) | ORAL | Status: DC

## 2015-01-31 NOTE — Patient Instructions (Signed)
Medication Instructions:  Increase coreg (carvedilol) to 12.5mg  two times a day.  Labwork: BMET today  Testing/Procedures: None today  Follow-Up: Your physician wants you to follow-up in: 1 year with Dr Aundra Dubin. (June 2017).  You will receive a reminder letter in the mail two months in advance. If you don't receive a letter, please call our office to schedule the follow-up appointment.  Any Other Special Instructions Will Be Listed Below (If Applicable).  Your physician has requested that you regularly monitor and record your blood pressure readings at home. Please use the same machine at the same time of day to check your readings and record them. I will call you in 2-3 weeks to get the readings. Eliot Ford   Thank you for choosing [facility]!!

## 2015-02-01 NOTE — Progress Notes (Signed)
Patient ID: Trevor Murphy, male   DOB: 11/02/1934, 80 y.o.   MRN: [id number] PCP: Dr. Baird Cancer  80 yo with history of HTN, diastolic CHF, and SVT presents for followup.  Patient was hospitalized in 5/15 with headache/blurry vision and was found to have hypertensive urgency with BP 220/130.  He had run out of his medications at home. He also had chest pain.  He initially ruled out for MI.  BP was lowered.  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  He had chest pain with the SVT and had a mild increase in troponin to 0.56.  CTA chest showed no PE.  Beta blocker was increased.  Echo showed EF 60-65%.  Lexiscan Cardiolite in 6/15 showed EF 54% with no ischemia or infarction.   Currently, he is short of breath walking up an incline or walking about 100 yards.  This is stable.  No orthopnea/PND.  No further tachypalpitations.  No chest pain.  BP is running a bit high.  He never smoked. No ETOH x 3 months.   ECG: NSR, LVH with repolarization abnormality, PVC   Labs (5/15): K 4, creatinine 1.93 Labs (6/15): K 3.9, creatinine 1.45, pro-BNP 848, HCT 44.4 Labs (9/15): LDL 86, HDL 39 Labs (12/15): K 4.3, creatinine 1.7, BNP 39, AST 53, ALT 39  PMH: 1. Umbilical hernia s/p repair.  2. HTN  3. CKD: Likely from HTN  4. Glaucoma  5. CVA  6. Left eye blindness  7. Diastolic CHF: Echo (123456) with EF 55-60%, mild AI, mild MR.  Echo (5/15) with EF 60-65%, mild LVH, mild AI.  8. Gout  9. 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  Got adenosine from EMS in 2015 for SVT.  During 5/15 hospitalization had episode of short R-P tachycardia (possible AVNRT) terminated by adenosine.  11. Lexiscan Cardiolite (6/15) with EF 54%, no ischemia/infarction.  46. H/o ETOH abuse  SH: Married, nonsmoker, lives in [place], h/o ETOH abuse.   FH: CAD  ROS: All systems reviewed and negative except as per HPI.   Current Outpatient Prescriptions  Medication Sig Dispense Refill   . acetaminophen (TYLENOL) 500 MG tablet Take 1,000 mg by mouth every 6 (six) hours as needed for headache.    . allopurinol (ZYLOPRIM) 100 MG tablet Take 100 mg by mouth daily.    [name] Kitchen amLODipine (NORVASC) 10 MG tablet Take 1 tablet (10 mg total) by mouth daily. 30 tablet 0  . aspirin EC 81 MG tablet Take 81 mg by mouth daily.    . cloNIDine (CATAPRES) 0.1 MG tablet Take 1 tablet (0.1 mg total) by mouth 2 (two) times daily. 60 tablet 0  . colchicine 0.6 MG tablet Take 0.6 mg by mouth 2 (two) times daily.    . furosemide (LASIX) 20 MG tablet Take 1 tablet (20 mg total) by mouth every other day. 30 tablet 3  . hydrALAZINE (APRESOLINE) 100 MG tablet Take 1 tablet (100 mg total) by mouth every 8 (eight) hours. 90 tablet 5  . isosorbide mononitrate (IMDUR) 30 MG 24 hr tablet Take 1 tablet (30 mg total) by mouth daily. 30 tablet 0  . nitroGLYCERIN (NITROSTAT) 0.4 MG SL tablet Place 1 tablet (0.4 mg total) under the tongue every 5 (five) minutes as needed for chest pain. 30 tablet 0  . ondansetron (ZOFRAN ODT) 4 MG disintegrating tablet Take 1 tablet (4 mg total) by mouth every 8 (eight) hours as needed for nausea. 10 tablet 0  .  pantoprazole (PROTONIX) 40 MG tablet Take 1 tablet (40 mg total) by mouth daily. 60 tablet 0  . potassium chloride (K-DUR,KLOR-CON) 10 MEQ tablet Take 1 tablet (10 mEq total) by mouth every other day. 30 tablet 2  . rosuvastatin (CRESTOR) 5 MG tablet Take 1 tablet (5 mg total) by mouth daily. 30 tablet 6  . carvedilol (COREG) 12.5 MG tablet Take 1 tablet (12.5 mg total) by mouth 2 (two) times daily. 180 tablet 3   No current facility-administered medications for this visit.    BP 146/82 mmHg  Pulse 78  Ht 5\' 10"  (1.778 m)  Wt 169 lb (76.658 kg)  BMI 24.25 kg/m2 General: NAD Neck: JVP 7 cm, no thyromegaly or thyroid nodule.  Lungs: Clear to auscultation bilaterally with normal respiratory effort. CV: Nondisplaced PMI.  Heart regular S1/S2, +S4, no murmur.  No peripheral  edema.  No carotid bruit.  Normal pedal pulses.  Abdomen: Soft, nontender, no hepatosplenomegaly, no distention.  Skin: Intact without lesions or rashes.  Neurologic: Alert and oriented x 3.  Psych: Normal affect. Extremities: No clubbing or cyanosis.   Assessment/Plan: 1. SVT: No further episodes of symptomatic SVT.  He had short R-P tachycardia that may be AVNRT.  Continue Coreg.  If he had frequent recurrences, could consider ablation.  2. HTN: BP running high.  I will increase Coreg to 12.5 mg bid.  His grand-daughter will check his BP daily for 2 wks and we will call for readings. 3. CAD: Patient had elevated troponin in the setting of SVT.  ETT-Cardiolite in 6/15 showed no ischemia or infarction, suspect demand ischemia.  Continue ASA.  4. Hyperlipidemia: Lipids ok in 9/15.  5. Chronic diastolic CHF: He looks euvolemic on exam, continue current Lasix.  BMET today.   6. CKD: BMET today.    Loralie Champagne 02/01/2015

## 2015-02-04 ENCOUNTER — Observation Stay (HOSPITAL_COMMUNITY)
Admission: EM | Admit: 2015-02-04 | Discharge: 2015-02-05 | Disposition: A | Discharge status: Home / Self Care | Payer: Medicare Other | Attending: Internal Medicine | Admitting: Internal Medicine

## 2015-02-04 ENCOUNTER — Emergency Department (HOSPITAL_COMMUNITY): Payer: Medicare Other

## 2015-02-04 ENCOUNTER — Encounter (HOSPITAL_COMMUNITY): Payer: Self-pay | Admitting: Emergency Medicine

## 2015-02-04 DIAGNOSIS — I160 Hypertensive urgency: Secondary | ICD-10-CM

## 2015-02-04 DIAGNOSIS — I2510 Atherosclerotic heart disease of native coronary artery without angina pectoris: Secondary | ICD-10-CM | POA: Diagnosis not present

## 2015-02-04 DIAGNOSIS — N183 Chronic kidney disease, stage 3 unspecified: Secondary | ICD-10-CM | POA: Diagnosis present

## 2015-02-04 DIAGNOSIS — G4489 Other headache syndrome: Secondary | ICD-10-CM

## 2015-02-04 DIAGNOSIS — I129 Hypertensive chronic kidney disease with stage 1 through stage 4 chronic kidney disease, or unspecified chronic kidney disease: Secondary | ICD-10-CM | POA: Diagnosis present

## 2015-02-04 DIAGNOSIS — K219 Gastro-esophageal reflux disease without esophagitis: Secondary | ICD-10-CM | POA: Diagnosis not present

## 2015-02-04 DIAGNOSIS — I130 Hypertensive heart and chronic kidney disease with heart failure and stage 1 through stage 4 chronic kidney disease, or unspecified chronic kidney disease: Secondary | ICD-10-CM | POA: Diagnosis present

## 2015-02-04 DIAGNOSIS — F1010 Alcohol abuse, uncomplicated: Secondary | ICD-10-CM | POA: Diagnosis not present

## 2015-02-04 DIAGNOSIS — E785 Hyperlipidemia, unspecified: Secondary | ICD-10-CM | POA: Insufficient documentation

## 2015-02-04 DIAGNOSIS — I5032 Chronic diastolic (congestive) heart failure: Secondary | ICD-10-CM | POA: Diagnosis not present

## 2015-02-04 DIAGNOSIS — Z8673 Personal history of transient ischemic attack (TIA), and cerebral infarction without residual deficits: Secondary | ICD-10-CM | POA: Insufficient documentation

## 2015-02-04 DIAGNOSIS — N1832 Chronic kidney disease, stage 3b: Secondary | ICD-10-CM | POA: Diagnosis present

## 2015-02-04 DIAGNOSIS — H5442 Blindness, left eye, normal vision right eye: Secondary | ICD-10-CM | POA: Diagnosis not present

## 2015-02-04 DIAGNOSIS — I639 Cerebral infarction, unspecified: Secondary | ICD-10-CM | POA: Diagnosis present

## 2015-02-04 DIAGNOSIS — M109 Gout, unspecified: Secondary | ICD-10-CM | POA: Insufficient documentation

## 2015-02-04 DIAGNOSIS — I10 Essential (primary) hypertension: Secondary | ICD-10-CM | POA: Diagnosis present

## 2015-02-04 LAB — COMPREHENSIVE METABOLIC PANEL
ALT: 11 U/L — ABNORMAL LOW (ref 17–63)
AST: 19 U/L (ref 15–41)
Albumin: 3.6 g/dL (ref 3.5–5.0)
Alkaline Phosphatase: 97 U/L (ref 38–126)
Anion gap: 11 (ref 5–15)
BUN: 16 mg/dL (ref 6–20)
CO2: 21 mmol/L — ABNORMAL LOW (ref 22–32)
Calcium: 8.8 mg/dL — ABNORMAL LOW (ref 8.9–10.3)
Chloride: 104 mmol/L (ref 101–111)
Creatinine, Ser: 1.59 mg/dL — ABNORMAL HIGH (ref 0.61–1.24)
GFR calc Af Amer: 47 mL/min — ABNORMAL LOW (ref >60)
GFR calc non Af Amer: 40 mL/min — ABNORMAL LOW (ref >60)
Glucose, Bld: 101 mg/dL — ABNORMAL HIGH (ref 65–99)
Potassium: 4.4 mmol/L (ref 3.5–5.1)
Sodium: 136 mmol/L (ref 135–145)
Total Bilirubin: 0.8 mg/dL (ref 0.3–1.2)
Total Protein: 7.5 g/dL (ref 6.5–8.1)

## 2015-02-04 LAB — CBC
HCT: 48.2 % (ref 39.0–52.0)
Hemoglobin: 16.2 g/dL (ref 13.0–17.0)
MCH: 26.6 pg (ref 26.0–34.0)
MCHC: 33.6 g/dL (ref 30.0–36.0)
MCV: 79 fL (ref 78.0–100.0)
Platelets: 169 10*3/uL (ref 150–400)
RBC: 6.1 MIL/uL — ABNORMAL HIGH (ref 4.22–5.81)
RDW: 15.8 % — ABNORMAL HIGH (ref 11.5–15.5)
WBC: 4.1 10*3/uL (ref 4.0–10.5)

## 2015-02-04 MED ORDER — HYDRALAZINE HCL 50 MG PO TABS
100.0000 mg | ORAL_TABLET | Freq: Once | ORAL | Status: AC
  Administered 2015-02-05: 100 mg via ORAL
  Filled 2015-02-04: qty 2

## 2015-02-04 MED ORDER — LABETALOL HCL 5 MG/ML IV SOLN
10.0000 mg | Freq: Once | INTRAVENOUS | Status: AC
  Administered 2015-02-04: 10 mg via INTRAVENOUS
  Filled 2015-02-04: qty 4

## 2015-02-04 MED ORDER — CLONIDINE HCL 0.2 MG PO TABS
0.2000 mg | ORAL_TABLET | Freq: Two times a day (BID) | ORAL | Status: DC
  Administered 2015-02-04: 0.2 mg via ORAL
  Filled 2015-02-04: qty 1

## 2015-02-04 MED ORDER — TETRACAINE HCL 0.5 % OP SOLN
2.0000 [drp] | Freq: Once | OPHTHALMIC | Status: AC
  Administered 2015-02-04: 2 [drp] via OPHTHALMIC
  Filled 2015-02-04: qty 2

## 2015-02-04 MED ORDER — CARVEDILOL 12.5 MG PO TABS
12.5000 mg | ORAL_TABLET | Freq: Once | ORAL | Status: AC
  Administered 2015-02-05: 12.5 mg via ORAL
  Filled 2015-02-04: qty 1

## 2015-02-04 MED ORDER — SODIUM CHLORIDE 0.9 % IV BOLUS (SEPSIS)
1000.0000 mL | Freq: Once | INTRAVENOUS | Status: AC
  Administered 2015-02-04: 1000 mL via INTRAVENOUS

## 2015-02-04 NOTE — ED Provider Notes (Signed)
CSN: [id number]     Arrival date & time 02/04/15  1616 History   First MD Initiated Contact with Patient 02/04/15 2019     Chief Complaint  Patient presents with  . Hypertension  . Headache  . Loss of Vision     (Consider location/radiation/quality/duration/timing/severity/associated sxs/prior Treatment) Patient is a 80 y.o. male presenting with headaches.  Headache Pain location:  Generalized Quality:  Dull Radiates to:  Does not radiate Severity currently:  10/10 Severity at highest:  10/10 Onset quality:  Sudden Duration:  1 day Timing:  Constant Progression:  Unchanged Chronicity:  Recurrent Similar to prior headaches: yes   Context comment:  Easily  secondary to dehydration, malnutrition, alcohol consumption. Relieved by:  None tried Worsened by:  Nothing Ineffective treatments:  None tried Associated symptoms: dizziness, fatigue and visual change (Vision loss which usually occurs with these headaches)   Associated symptoms: no abdominal pain, no back pain, no blurred vision, no congestion, no cough, no diarrhea, no ear pain, no eye pain, no facial pain, no fever, no focal weakness, no nausea, no near-syncope, no neck stiffness, no numbness, no photophobia, no sore throat, no vomiting and no weakness     Past Medical History  Diagnosis Date  . Poor circulation   . H/O umbilical hernia repair   . Gout   . Glaucoma   . Hypertension     a. Patent renal arteries 2005.  [name] Kitchen Arthritis   . Headache(784.0)     " WHEN MY BLOOD PRESSURE IS UP"  . Other and unspecified hyperlipidemia   . OA (osteoarthritis)   . CAD (coronary artery disease)     a. Cath 08/2009: minimal nonobstructive disease (25% LAD). b. Lexiscan nuc 6/215: EF 54%, no ischemia or infarction.  [name] Kitchen History of acute renal failure   . Blindness of left eye   . CKD (chronic kidney disease) stage 3, GFR 30-59 ml/min 07/26/2012    a. Felt likely due to HTN.  . CVA (cerebral infarction) 07/28/2012  . HTN  (hypertension)   . Chronic diastolic CHF (congestive heart failure)     a. Echo 2013: EF 55-60%, mild AI, mild MR. b. Echo 5/15 with EF 60-65%, mild LVH, mild AI.  [name] Kitchen Noncompliance   . 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  Got adenosine from EMS in 2015 for SVT.  During 5/15 hospitalization had episode of short R-P tachycardia (possible AVNRT) terminated by adenosine.   [name] Kitchen History of alcohol abuse    Past Surgical History  Procedure Laterality Date  . Hernia repair    . Ventral hernia repair  03/07/2012    Procedure: LAPAROSCOPIC VENTRAL HERNIA;  Surgeon: Gwenyth Ober, MD;  Location: [facility];  Service: General;  Laterality: N/A;  . Appendectomy    . Cataract extraction extracapsular Right 05/17/2013    Procedure: CATARACT EXTRACTION EXTRACAPSULAR WITH INTRAOCULAR LENS PLACEMENT (IOC) RIGHT EYE;  Surgeon: Marylynn Pearson, MD;  Location: [facility];  Service: Ophthalmology;  Laterality: Right;  . Cardiac catheterization  11/2003    EF 50-55%  . Cardiac catheterization  08/2009   Family History  Problem Relation Age of Onset  . CVA Mother   . Hypertension Sister   . Hypertension Brother   . Hypertension Brother   . Heart attack Neg Hx   . Stroke Sister    History  Substance Use Topics  . Smoking status: Never Smoker   . Smokeless tobacco: Never Used  . Alcohol Use: 0.6 oz/week  1 Cans of beer per week     Comment: Pt drank etoh yesterday and family reports heavily    Review of Systems  Constitutional: Positive for fatigue. Negative for fever, chills and appetite change.  HENT: Negative for congestion, ear pain, facial swelling, mouth sores and sore throat.   Eyes: Negative for blurred vision, photophobia, pain and visual disturbance.  Respiratory: Negative for cough, chest tightness and shortness of breath.   Cardiovascular: Negative for chest pain, palpitations and near-syncope.  Gastrointestinal: Negative for nausea, vomiting, abdominal pain, diarrhea and blood in  stool.  Endocrine: Negative for cold intolerance and heat intolerance.  Genitourinary: Negative for frequency, decreased urine volume and difficulty urinating.  Musculoskeletal: Negative for back pain and neck stiffness.  Skin: Negative for rash.  Neurological: Positive for dizziness and headaches. Negative for focal weakness, weakness, light-headedness and numbness.  All other systems reviewed and are negative.     Allergies  Review of patient's allergies indicates no known allergies.  Home Medications   Prior to Admission medications   Medication Sig Start Date End Date Taking? Authorizing Provider  acetaminophen (TYLENOL) 500 MG tablet Take 1,000 mg by mouth every 6 (six) hours as needed for headache.   Yes Historical Provider, MD  allopurinol (ZYLOPRIM) 100 MG tablet Take 100 mg by mouth daily.   Yes Historical Provider, MD  aspirin EC 81 MG tablet Take 81 mg by mouth daily.   Yes Historical Provider, MD  carvedilol (COREG) 12.5 MG tablet Take 1 tablet (12.5 mg total) by mouth 2 (two) times daily. 01/31/15  Yes Larey Dresser, MD  cloNIDine (CATAPRES) 0.2 MG tablet Take 0.2 mg by mouth 2 (two) times daily.   Yes Historical Provider, MD  hydrALAZINE (APRESOLINE) 100 MG tablet Take 1 tablet (100 mg total) by mouth every 8 (eight) hours. 12/18/13  Yes Ripudeep Krystal Eaton, MD  amLODipine (NORVASC) 10 MG tablet Take 1 tablet (10 mg total) by mouth daily. Patient not taking: Reported on 02/04/2015 03/12/14   Thurnell Lose, MD  cloNIDine (CATAPRES) 0.1 MG tablet Take 1 tablet (0.1 mg total) by mouth 2 (two) times daily. Patient not taking: Reported on 02/04/2015 03/12/14   Thurnell Lose, MD  furosemide (LASIX) 20 MG tablet Take 1 tablet (20 mg total) by mouth every other day. Patient not taking: Reported on 02/04/2015 02/12/14   Larey Dresser, MD  isosorbide mononitrate (IMDUR) 30 MG 24 hr tablet Take 1 tablet (30 mg total) by mouth daily. Patient not taking: Reported on 02/04/2015 03/12/14    Thurnell Lose, MD  nitroGLYCERIN (NITROSTAT) 0.4 MG SL tablet Place 1 tablet (0.4 mg total) under the tongue every 5 (five) minutes as needed for chest pain. 12/21/13   Geradine Girt, DO  ondansetron (ZOFRAN ODT) 4 MG disintegrating tablet Take 1 tablet (4 mg total) by mouth every 8 (eight) hours as needed for nausea. Patient not taking: Reported on 02/04/2015 07/08/14   Noemi Chapel, MD  pantoprazole (PROTONIX) 40 MG tablet Take 1 tablet (40 mg total) by mouth daily. Patient not taking: Reported on 02/04/2015 04/12/14   Charlynne Cousins, MD  potassium chloride (K-DUR,KLOR-CON) 10 MEQ tablet Take 1 tablet (10 mEq total) by mouth every other day. Patient not taking: Reported on 02/04/2015 02/12/14   Larey Dresser, MD  rosuvastatin (CRESTOR) 5 MG tablet Take 1 tablet (5 mg total) by mouth daily. Patient not taking: Reported on 02/04/2015 04/20/14   Dayna N Dunn, PA-C   BP  198/94 mmHg  Pulse 60  Temp(Src) 98.1 F (36.7 C) (Oral)  Resp 15  Ht 5\' 10"  (1.778 m)  Wt 164 lb (74.39 kg)  BMI 23.53 kg/m2  SpO2 99% Physical Exam  Constitutional: He is oriented to person, place, and time. He appears well-nourished. No distress.  HENT:  Head: Normocephalic and atraumatic.  Right Ear: External ear normal.  Left Ear: External ear normal.  Eyes: Pupils are equal, round, and reactive to light. Right eye exhibits no discharge. Left eye exhibits no discharge. No scleral icterus.  Loss of vision fields. intraocularPressures normal bilaterally.   Neck: Normal range of motion. Neck supple.  Cardiovascular: Normal rate.  Exam reveals no gallop and no friction rub.   No murmur heard. Pulmonary/Chest: Effort normal and breath sounds normal. No stridor. No respiratory distress. He has no wheezes. He has no rales. He exhibits no tenderness.  Abdominal: Soft. He exhibits no distension and no mass. There is no tenderness. There is no rebound and no guarding.  Musculoskeletal: He exhibits no edema or tenderness.   Neurological: He is alert and oriented to person, place, and time. GCS eye subscore is 4. GCS verbal subscore is 5. GCS motor subscore is 6.  Cranial Nerves  II Visual Fields: Intact to confrontation. Visual fields intact. III, IV, VI: Pupils equal and reactive to light and near. Full eye movement without nystagmus  V Facial Sensation: Normal. No weakness of masticatory muscles  VII: No facial weakness or asymmetry  VIII Auditory Acuity: Grossly normal  IX/X: The uvula is midline; the palate elevates symmetrically  XI: Normal sternocleidomastoid and trapezius strength  XII: The tongue is midline. No atrophy or fasciculations.   Motor System: Muscle Strength: 5/5 and symmetric in the upper and lower extremities. No pronation or drift.  Muscle Tone: Tone and muscle bulk are normal in the upper and lower extremities.   Reflexes: DTRs: 2+ and symmetrical in all four extremities. Plantar responses are flexor bilaterally.  Coordination: Intact finger-to-nose, heel-to-shin.  No tremor.  Sensation: Intact to light touch.  Gait: deferred   Skin: Skin is warm and dry. No rash noted. He is not diaphoretic. No erythema.    ED Course  Procedures (including critical care time) Labs Review Labs Reviewed  CBC - Abnormal; Notable for the following:    RBC 6.10 (*)    RDW 15.8 (*)    All other components within normal limits  COMPREHENSIVE METABOLIC PANEL - Abnormal; Notable for the following:    CO2 21 (*)    Glucose, Bld 101 (*)    Creatinine, Ser 1.59 (*)    Calcium 8.8 (*)    ALT 11 (*)    GFR calc non Af Amer 40 (*)    GFR calc Af Amer 47 (*)    All other components within normal limits    Imaging Review No results found.   EKG Interpretation   Date/Time:  Monday February 04 2015 20:29:14 EDT Ventricular Rate:  53 PR Interval:  263 QRS Duration: 94 QT Interval:  516 QTC Calculation: 484 R Axis:   28 Text Interpretation:  Sinus rhythm Prolonged PR interval Borderline abnrm  T,  anterolateral leads Borderline prolonged QT interval No significant  change was found Confirmed by Wyvonnia Dusky  MD, STEPHEN [phone]) on 02/04/2015  8:45:15 PM      MDM   80 year old male with past medical history listed above presents with headache similar to his previous headaches with associated dizziness and vision loss. These occur  usually when patient is hypertensive, dehydrated, not eating well, and having alcohol consumption. Rest of the history and exam as above. Patient's visual fields are him. However finger-nose is intact. Rest of the exam is nonfocal. Patient given IV fluids which had some improvement of his headache from a 10 out of 10 to an 8 on 10. Labs revealed no acute changes. Blood pressure increased and patient was given labetalol and his nighttime meds. Initially he declined CT head however we were able to talk him into getting the scan. This revealed no acute process. EKG with no significant changes from before. Patient be admitted for likely hypertensive urgency.  Patient seen in conjunction with Dr. Wyvonnia Dusky.  Final diagnoses:  Hypertensive urgency  Other headache syndrome       Addison Lank, MD 02/05/15 [id number]  Ezequiel Essex, MD 02/05/15 [phone]

## 2015-02-04 NOTE — ED Notes (Signed)
Pt reports headache, room spinning and loss of vision since last night. Hx of cataracts but sts he could see some before last night. Also reports hypertension- took medications as prescribed.

## 2015-02-04 NOTE — ED Notes (Signed)
Pt refusing CT scan, son at bedside.

## 2015-02-04 NOTE — ED Notes (Signed)
Pt requesting to go home.  Dr Wyvonnia Dusky notified.

## 2015-02-05 ENCOUNTER — Encounter (HOSPITAL_COMMUNITY): Payer: Self-pay | Admitting: Radiology

## 2015-02-05 ENCOUNTER — Emergency Department (HOSPITAL_COMMUNITY): Payer: Medicare Other

## 2015-02-05 DIAGNOSIS — N183 Chronic kidney disease, stage 3 (moderate): Secondary | ICD-10-CM

## 2015-02-05 DIAGNOSIS — E785 Hyperlipidemia, unspecified: Secondary | ICD-10-CM

## 2015-02-05 DIAGNOSIS — K219 Gastro-esophageal reflux disease without esophagitis: Secondary | ICD-10-CM | POA: Diagnosis present

## 2015-02-05 DIAGNOSIS — I5032 Chronic diastolic (congestive) heart failure: Secondary | ICD-10-CM | POA: Diagnosis not present

## 2015-02-05 DIAGNOSIS — I2510 Atherosclerotic heart disease of native coronary artery without angina pectoris: Secondary | ICD-10-CM

## 2015-02-05 DIAGNOSIS — I10 Essential (primary) hypertension: Secondary | ICD-10-CM | POA: Diagnosis not present

## 2015-02-05 LAB — GLUCOSE, CAPILLARY: Glucose-Capillary: 99 mg/dL (ref 65–99)

## 2015-02-05 LAB — PROTIME-INR
INR: 1.21 (ref 0.00–1.49)
Prothrombin Time: 15.5 seconds — ABNORMAL HIGH (ref 11.6–15.2)

## 2015-02-05 LAB — CBC
HCT: 43.9 % (ref 39.0–52.0)
Hemoglobin: 14.4 g/dL (ref 13.0–17.0)
MCH: 25.9 pg — ABNORMAL LOW (ref 26.0–34.0)
MCHC: 32.8 g/dL (ref 30.0–36.0)
MCV: 79.1 fL (ref 78.0–100.0)
Platelets: 164 10*3/uL (ref 150–400)
RBC: 5.55 MIL/uL (ref 4.22–5.81)
RDW: 15.8 % — ABNORMAL HIGH (ref 11.5–15.5)
WBC: 3.7 10*3/uL — ABNORMAL LOW (ref 4.0–10.5)

## 2015-02-05 LAB — BASIC METABOLIC PANEL
Anion gap: 6 (ref 5–15)
BUN: 17 mg/dL (ref 6–20)
CO2: 23 mmol/L (ref 22–32)
Calcium: 8.1 mg/dL — ABNORMAL LOW (ref 8.9–10.3)
Chloride: 107 mmol/L (ref 101–111)
Creatinine, Ser: 1.53 mg/dL — ABNORMAL HIGH (ref 0.61–1.24)
GFR calc Af Amer: 49 mL/min — ABNORMAL LOW (ref >60)
GFR calc non Af Amer: 42 mL/min — ABNORMAL LOW (ref >60)
Glucose, Bld: 96 mg/dL (ref 65–99)
Potassium: 4 mmol/L (ref 3.5–5.1)
Sodium: 136 mmol/L (ref 135–145)

## 2015-02-05 LAB — BRAIN NATRIURETIC PEPTIDE: B Natriuretic Peptide: 132.2 pg/mL — ABNORMAL HIGH (ref 0.0–100.0)

## 2015-02-05 MED ORDER — HYDRALAZINE HCL 20 MG/ML IJ SOLN
10.0000 mg | Freq: Once | INTRAMUSCULAR | Status: AC
  Administered 2015-02-05: 10 mg via INTRAVENOUS
  Filled 2015-02-05: qty 1

## 2015-02-05 MED ORDER — AMLODIPINE BESYLATE 10 MG PO TABS
10.0000 mg | ORAL_TABLET | Freq: Every day | ORAL | Status: DC
  Administered 2015-02-05: 10 mg via ORAL
  Filled 2015-02-05: qty 1

## 2015-02-05 MED ORDER — HYDRALAZINE HCL 50 MG PO TABS
100.0000 mg | ORAL_TABLET | Freq: Three times a day (TID) | ORAL | Status: DC
  Administered 2015-02-05 (×2): 100 mg via ORAL
  Filled 2015-02-05 (×4): qty 2

## 2015-02-05 MED ORDER — PANTOPRAZOLE SODIUM 40 MG PO TBEC
40.0000 mg | DELAYED_RELEASE_TABLET | Freq: Every day | ORAL | Status: DC
Start: 2015-02-05 — End: 2015-02-05
  Administered 2015-02-05: 40 mg via ORAL
  Filled 2015-02-05: qty 1

## 2015-02-05 MED ORDER — ACETAMINOPHEN 325 MG PO TABS
650.0000 mg | ORAL_TABLET | Freq: Three times a day (TID) | ORAL | Status: DC | PRN
  Administered 2015-02-05: 650 mg via ORAL
  Filled 2015-02-05: qty 2

## 2015-02-05 MED ORDER — HEPARIN SODIUM (PORCINE) 5000 UNIT/ML IJ SOLN
5000.0000 [IU] | Freq: Three times a day (TID) | INTRAMUSCULAR | Status: DC
  Administered 2015-02-05: 5000 [IU] via SUBCUTANEOUS
  Filled 2015-02-05 (×4): qty 1

## 2015-02-05 MED ORDER — NITROGLYCERIN 0.4 MG SL SUBL: 0.4000 mg | SUBLINGUAL_TABLET | SUBLINGUAL | Status: DC | PRN

## 2015-02-05 MED ORDER — CLONIDINE HCL 0.1 MG PO TABS
0.1000 mg | ORAL_TABLET | Freq: Three times a day (TID) | ORAL | Status: DC
  Administered 2015-02-05 (×2): 0.1 mg via ORAL
  Filled 2015-02-05 (×4): qty 1

## 2015-02-05 MED ORDER — ASPIRIN EC 81 MG PO TBEC
81.0000 mg | DELAYED_RELEASE_TABLET | Freq: Every day | ORAL | Status: DC
  Administered 2015-02-05: 81 mg via ORAL
  Filled 2015-02-05: qty 1

## 2015-02-05 MED ORDER — ONDANSETRON 4 MG PO TBDP
4.0000 mg | ORAL_TABLET | Freq: Three times a day (TID) | ORAL | Status: DC | PRN
  Filled 2015-02-05: qty 1

## 2015-02-05 MED ORDER — HYDRALAZINE HCL 20 MG/ML IJ SOLN: 5.0000 mg | INTRAMUSCULAR | Status: DC | PRN

## 2015-02-05 MED ORDER — ALUM & MAG HYDROXIDE-SIMETH 200-200-20 MG/5ML PO SUSP: 30.0000 mL | Freq: Four times a day (QID) | ORAL | Status: DC | PRN

## 2015-02-05 MED ORDER — SODIUM CHLORIDE 0.9 % IJ SOLN
3.0000 mL | Freq: Two times a day (BID) | INTRAMUSCULAR | Status: DC
  Administered 2015-02-05 (×2): 3 mL via INTRAVENOUS

## 2015-02-05 MED ORDER — ROSUVASTATIN CALCIUM 5 MG PO TABS
5.0000 mg | ORAL_TABLET | Freq: Every day | ORAL | Status: DC
  Administered 2015-02-05: 5 mg via ORAL
  Filled 2015-02-05: qty 1

## 2015-02-05 MED ORDER — CLONIDINE HCL 0.1 MG PO TABS: 0.1000 mg | ORAL_TABLET | Freq: Three times a day (TID) | ORAL | Status: DC

## 2015-02-05 MED ORDER — ISOSORBIDE MONONITRATE ER 30 MG PO TB24
30.0000 mg | ORAL_TABLET | Freq: Every day | ORAL | Status: DC
  Administered 2015-02-05: 30 mg via ORAL
  Filled 2015-02-05: qty 1

## 2015-02-05 MED ORDER — CARVEDILOL 12.5 MG PO TABS
12.5000 mg | ORAL_TABLET | Freq: Two times a day (BID) | ORAL | Status: DC
  Administered 2015-02-05: 12.5 mg via ORAL
  Filled 2015-02-05 (×3): qty 1

## 2015-02-05 MED ORDER — ALLOPURINOL 100 MG PO TABS
100.0000 mg | ORAL_TABLET | Freq: Every day | ORAL | Status: DC
  Administered 2015-02-05: 100 mg via ORAL
  Filled 2015-02-05: qty 1

## 2015-02-05 NOTE — H&P (Signed)
[facility] History and Physical  Trevor Murphy [id number] DOB: [birth year] DOA: 02/04/2015  Referring physician: ED physician PCP: Maximino Greenland, MD  Specialists:   Chief Complaint: Headache and worsening vision in right eye  HPI: Trevor Murphy is a 80 y.o. male with PMH of hypertension, hyperlipidemia, GERD, alcohol abuse ( did not drink for more than 3 months), mild CAD, chronic kidney disease-history, CVA, diastolic congestive heart failure, left eye blindness, worsening vision in right eye.   Patient reports that he has not been taking some of his blood pressure medications. He developed headache and elevaved blood pressure. He reports that he has left eye blindness, and very poor vision in right eye. He could see some thing in the right eye normally, but his right eye vision has worsened today. Patient was treated with clonidine, hydralazine and labetalol in emergency room. His blood pressure improved. When I saw patient in ED, he states that his HA and vision change have resolved after his blood pressure improved. Patient does not have chest pain, cough, shortness of breath. No new unilateral weakness in his extremities.  In ED, patient was found to have elevated blood pressure 176/87. WBC 4.1, temperature normal, slightly bradycardia, stable renal function. CT head is negative for acute intracranial abnormalities, but showed right sphenoid sinusitis. Patient is admitted to inpatient for further evaluation and treatment.  Where does patient live?   At home    Can patient participate in ADLs? Barely   Review of Systems:   General: no fevers, chills, no changes in body weight, has fatigue and HA HEENT: has vision loss, no  hearing changes or sore throat Pulm: no dyspnea, coughing, wheezing CV: no chest pain, palpitations Abd: no nausea, vomiting, abdominal pain, diarrhea, constipation GU: no dysuria, burning on urination, increased urinary frequency, hematuria  Ext: no leg  edema Neuro: no unilateral weakness, numbness, or tingling, no vision change or hearing loss Skin: no rash MSK: No muscle spasm, no deformity, no limitation of range of movement in spin Heme: No easy bruising.  Travel history: No recent long distant travel.  Allergy: No Known Allergies  Past Medical History  Diagnosis Date  . Poor circulation   . H/O umbilical hernia repair   . Gout   . Glaucoma   . Hypertension     a. Patent renal arteries 2005.  [name] Kitchen Arthritis   . Headache(784.0)     " WHEN MY BLOOD PRESSURE IS UP"  . Other and unspecified hyperlipidemia   . OA (osteoarthritis)   . CAD (coronary artery disease)     a. Cath 08/2009: minimal nonobstructive disease (25% LAD). b. Lexiscan nuc 6/215: EF 54%, no ischemia or infarction.  [name] Kitchen History of acute renal failure   . Blindness of left eye   . CKD (chronic kidney disease) stage 3, GFR 30-59 ml/min 07/26/2012    a. Felt likely due to HTN.  . CVA (cerebral infarction) 07/28/2012  . HTN (hypertension)   . Chronic diastolic CHF (congestive heart failure)     a. Echo 2013: EF 55-60%, mild AI, mild MR. b. Echo 5/15 with EF 60-65%, mild LVH, mild AI.  [name] Kitchen Noncompliance   . 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  Got adenosine from EMS in 2015 for SVT.  During 5/15 hospitalization had episode of short R-P tachycardia (possible AVNRT) terminated by adenosine.   [name] Kitchen History of alcohol abuse     Past Surgical History  Procedure Laterality Date  . Hernia  repair    . Ventral hernia repair  03/07/2012    Procedure: LAPAROSCOPIC VENTRAL HERNIA;  Surgeon: Gwenyth Ober, MD;  Location: [place];  Service: General;  Laterality: N/A;  . Appendectomy    . Cataract extraction extracapsular Right 05/17/2013    Procedure: CATARACT EXTRACTION EXTRACAPSULAR WITH INTRAOCULAR LENS PLACEMENT (IOC) RIGHT EYE;  Surgeon: Marylynn Pearson, MD;  Location: [place];  Service: Ophthalmology;  Laterality: Right;  . Cardiac catheterization  11/2003    EF  50-55%  . Cardiac catheterization  08/2009    Social History:  reports that he has never smoked. He has never used smokeless tobacco. He reports that he drinks about 0.6 oz of alcohol per week. He reports that he does not use illicit drugs.  Family History:  Family History  Problem Relation Age of Onset  . CVA Mother   . Hypertension Sister   . Hypertension Brother   . Hypertension Brother   . Heart attack Neg Hx   . Stroke Sister      Prior to Admission medications   Medication Sig Start Date End Date Taking? Authorizing Provider  acetaminophen (TYLENOL) 500 MG tablet Take 1,000 mg by mouth every 6 (six) hours as needed for headache.   Yes Historical Provider, MD  allopurinol (ZYLOPRIM) 100 MG tablet Take 100 mg by mouth daily.   Yes Historical Provider, MD  aspirin EC 81 MG tablet Take 81 mg by mouth daily.   Yes Historical Provider, MD  carvedilol (COREG) 12.5 MG tablet Take 1 tablet (12.5 mg total) by mouth 2 (two) times daily. 01/31/15  Yes Larey Dresser, MD  cloNIDine (CATAPRES) 0.2 MG tablet Take 0.2 mg by mouth 2 (two) times daily.   Yes Historical Provider, MD  hydrALAZINE (APRESOLINE) 100 MG tablet Take 1 tablet (100 mg total) by mouth every 8 (eight) hours. 12/18/13  Yes Ripudeep Krystal Eaton, MD  amLODipine (NORVASC) 10 MG tablet Take 1 tablet (10 mg total) by mouth daily. Patient not taking: Reported on 02/04/2015 03/12/14   Thurnell Lose, MD  cloNIDine (CATAPRES) 0.1 MG tablet Take 1 tablet (0.1 mg total) by mouth 2 (two) times daily. Patient not taking: Reported on 02/04/2015 03/12/14   Thurnell Lose, MD  furosemide (LASIX) 20 MG tablet Take 1 tablet (20 mg total) by mouth every other day. Patient not taking: Reported on 02/04/2015 02/12/14   Larey Dresser, MD  isosorbide mononitrate (IMDUR) 30 MG 24 hr tablet Take 1 tablet (30 mg total) by mouth daily. Patient not taking: Reported on 02/04/2015 03/12/14   Thurnell Lose, MD  nitroGLYCERIN (NITROSTAT) 0.4 MG SL tablet Place 1  tablet (0.4 mg total) under the tongue every 5 (five) minutes as needed for chest pain. 12/21/13   Geradine Girt, DO  ondansetron (ZOFRAN ODT) 4 MG disintegrating tablet Take 1 tablet (4 mg total) by mouth every 8 (eight) hours as needed for nausea. Patient not taking: Reported on 02/04/2015 07/08/14   Noemi Chapel, MD  pantoprazole (PROTONIX) 40 MG tablet Take 1 tablet (40 mg total) by mouth daily. Patient not taking: Reported on 02/04/2015 04/12/14   Charlynne Cousins, MD  potassium chloride (K-DUR,KLOR-CON) 10 MEQ tablet Take 1 tablet (10 mEq total) by mouth every other day. Patient not taking: Reported on 02/04/2015 02/12/14   Larey Dresser, MD  rosuvastatin (CRESTOR) 5 MG tablet Take 1 tablet (5 mg total) by mouth daily. Patient not taking: Reported on 02/04/2015 04/20/14   Nedra Hai  Dunn, PA-C    Physical Exam: Filed Vitals:   02/05/15 0100 02/05/15 0140 02/05/15 0220 02/05/15 0611  BP: 175/87 183/88 132/67 135/79  Pulse: 59 69 74 74  Temp:  97.5 F (36.4 C)  98.3 F (36.8 C)  TempSrc:  Oral  Oral  Resp: 11 16  16   Height:  5\' 10"  (1.778 m)    Weight:  74.8 kg (164 lb 14.5 oz)  74.8 kg (164 lb 14.5 oz)  SpO2: 98% 97%  98%   General: Not in acute distress. Dry mucous and membrane HEENT:       Eyes: right eye PERRL, EOMI, no scleral icterus. Left eye blinded       ENT: No discharge from the ears and nose, no pharynx injection, no tonsillar enlargement.        Neck: No JVD, no bruit, no mass felt. Heme: No neck lymph node enlargement. Cardiac: S1/S2, RRR, No murmurs, No gallops or rubs. Pulm: No rales, wheezing, rhonchi or rubs. Abd: Soft, nondistended, nontender, no rebound pain, no organomegaly, BS present. Ext: No pitting leg edema bilaterally. 2+DP/PT pulse bilaterally. Musculoskeletal: No joint deformities, No joint redness or warmth, no limitation of ROM in spin. Skin: No rashes.  Neuro: Alert, oriented X3, cranial nerves II-XII grossly intact, muscle strength 5/5 in all  extremities, sensation to light touch intact. Brachial reflex 2+ bilaterally. Knee reflex 1+ bilaterally. Negative Babinski's sign. Normal finger to nose test. Psych: Patient is not psychotic, no suicidal or hemocidal ideation.  Labs on Admission:  Basic Metabolic Panel:  Recent Labs Lab 01/31/15 1339 02/04/15 2102 02/05/15 0604  NA 134* 136 136  K 4.6 4.4 4.0  CL 101 104 107  CO2 27 21* 23  GLUCOSE 99 101* 96  BUN 13 16 17   CREATININE 1.59* 1.59* 1.53*  CALCIUM 9.1 8.8* 8.1*   Liver Function Tests:  Recent Labs Lab 02/04/15 2102  AST 19  ALT 11*  ALKPHOS 97  BILITOT 0.8  PROT 7.5  ALBUMIN 3.6   No results for input(s): LIPASE, AMYLASE in the last 168 hours. No results for input(s): AMMONIA in the last 168 hours. CBC:  Recent Labs Lab 02/04/15 2102 02/05/15 0604  WBC 4.1 3.7*  HGB 16.2 14.4  HCT 48.2 43.9  MCV 79.0 79.1  PLT 169 164   Cardiac Enzymes: No results for input(s): CKTOTAL, CKMB, CKMBINDEX, TROPONINI in the last 168 hours.  BNP (last 3 results)  Recent Labs  02/05/15 0604  BNP 132.2*    ProBNP (last 3 results)  Recent Labs  04/11/14 1346 04/26/14 2022 07/25/14 1135  PROBNP 248.8 504.8* 39.0    CBG:  Recent Labs Lab 02/05/15 0609  GLUCAP 99    Radiological Exams on Admission: Ct Head Wo Contrast  02/05/2015   CLINICAL DATA:  Headache, hypertension, loss of vision.  EXAM: CT HEAD WITHOUT CONTRAST  TECHNIQUE: Contiguous axial images were obtained from the base of the skull through the vertex without intravenous contrast.  COMPARISON:  Head CT 07/08/2014  FINDINGS: Generalized atrophy. Chronic small vessel ischemic change, stable from prior. Small remote right cerebellar infarct. No intracranial hemorrhage, mass effect, or midline shift. No hydrocephalus. The basilar cisterns are patent. No evidence of territorial infarct. No intracranial fluid collection. Calvarium is intact. Improved aeration of the right sphenoid sinus with only  minimal residual mucosal thickening. Remaining paranasal sinuses are well-aerated. Mastoid air cells are clear.  IMPRESSION: Stable atrophy and chronic small vessel ischemic change without acute intracranial abnormality.  Electronically Signed   By: Jeb Levering M.D.   On: 02/05/2015 00:54    EKG: Independently reviewed.  Abnormal findings: QTC 484, U Wave in V2 to V3, nonspecific T-wave change.  Assessment/Plan Principal Problem:   Hypertensive urgency Active Problems:   Essential hypertension   CKD (chronic kidney disease) stage 3, GFR 30-59 ml/min   CVA (cerebral infarction)   Dyslipidemia   Alcohol abuse   Chronic diastolic CHF (congestive heart failure)   Mild coronary artery disease  Hypertensive urgency: Patient's presentations including elevated blood pressure and vision loss are consistent with hypertensive urgency. His symptoms have resolved after his blood pressure improved. This is most likely due to medication noncompliance. Patient has not been taking his hydralazine and Coreg at home. -will admit to tele bed -resume his home bed: Clonidine, hydralazine, amlodipine -IV hydralazine when necessary  CKD (chronic kidney disease) stage 3:  stable. Baseline creatinine 1.3-1.7, his creatinine is 1.59.  -follow-up renal function by BMP.  CVA (cerebral infarction):  -continue ASA  HLD: Last LDL was 86 on 04/11/14 -Continue home medications: Crestor  Chronic diastolic CHF (congestive heart failure): 2-D echo on 12/19/13 showed EF 60-65%. CHF is compensated on admission. Patient is clinically on the dry side. -hold lasix -check BNP   Mild coronary artery disease: no chest pain -Aspirin, Imdur, prn NTG  GERD: -Protonix  Gout: stable. -Continue allopurinol   DVT ppx: SQ Heparin   Code Status: Full code Family Communication:  Yes, patient's son  at bed side Disposition Plan: Admit to inpatient   Date of Service 02/05/2015    Ivor Costa [facility] Pager [phone]  If 7PM-7AM, please contact night-coverage [email or web address] Password [facility] 02/05/2015, 7:28 AM

## 2015-02-05 NOTE — Evaluation (Signed)
Physical Therapy Evaluation Patient Details Name: Trevor Murphy MRN: [id number] DOB: May 12, 1935 Today's Date: 02/05/2015   History of Present Illness  80 y.o. admitted with headache and worsening vision in right eye. PMH of hypertension, hyperlipidemia, GERD, alcohol abuse ( did not drink for more than 3 months), mild CAD, chronic kidney disease-history, CVA, diastolic congestive heart failure, left eye blindness, worsening vision in right eye.   Clinical Impression  Pt admitted with above diagnosis. Pt currently with functional limitations due to the deficits listed below (see PT Problem List). At the time of PT eval pt was very fatigued from earlier walk with MD. Was agreeable to participate minimally with therapy evaluation. Pt will benefit from skilled PT to increase their independence and safety with mobility to allow discharge to the venue listed below. Pt does not have 24 hour assist at home, however reports that pt's family are in and out throughout the day and provide him with meals 3x/day. Feel HHPT is a good option to improve safety and decrease risk of falls at home.     Follow Up Recommendations Home health PT    Equipment Recommendations  None recommended by PT    Recommendations for Other Services       Precautions / Restrictions Precautions Precautions: Fall Restrictions Weight Bearing Restrictions: No      Mobility  Bed Mobility Overal bed mobility: Modified Independent                Transfers Overall transfer level: Needs assistance Equipment used: Rolling walker (2 wheeled) Transfers: Sit to/from Stand Sit to Stand: Supervision         General transfer comment: Supervision for safety.  Ambulation/Gait Ambulation/Gait assistance: Min guard Ambulation Distance (Feet): 200 Feet Assistive device: Rolling walker (2 wheeled) Gait Pattern/deviations: Step-through pattern;Decreased stride length;Trunk flexed Gait velocity: Decreased Gait velocity  interpretation: Below normal speed for age/gender General Gait Details: Pt was cued to keep RW close to body and to be mindful of looking ahead in the hall for upcoming obstacles. Vision is low at baseline.  Stairs            Wheelchair Mobility    Modified Rankin (Stroke Patients Only)       Balance Overall balance assessment: Needs assistance Sitting-balance support: Feet supported;No upper extremity supported Sitting balance-Leahy Scale: Fair     Standing balance support: No upper extremity supported Standing balance-Leahy Scale: Poor Standing balance comment: Pt requires at least 1 UE support for balance.                              Pertinent Vitals/Pain Pain Assessment: No/denies pain    Home Living Family/patient expects to be discharged to:: Private residence (independent living) Living Arrangements: Alone Available Help at Discharge: Family;Available PRN/intermittently (may be able to get family member to stay 24/7 ) Type of Home: Apartment Home Access: Level entry     Home Layout: One level Home Equipment: Walker - 4 wheels;Grab bars - tub/shower;Bedside commode;Shower seat Additional Comments: Please see PT evaluation for any additional information    Prior Function Level of Independence: Needs assistance   Gait / Transfers Assistance Needed: Uses SPC for ambulation  ADL's / Homemaking Assistance Needed: assist with IADLs, but independent with bathing/dressing        Hand Dominance   Dominant Hand: Right    Extremity/Trunk Assessment   Upper Extremity Assessment: Defer to OT evaluation  Lower Extremity Assessment: Generalized weakness      Cervical / Trunk Assessment: Kyphotic (Flexed trunk)  Communication   Communication: No difficulties  Cognition Arousal/Alertness: Awake/alert Behavior During Therapy: WFL for tasks assessed/performed Overall Cognitive Status: Within Functional Limits for tasks assessed                       General Comments      Exercises        Assessment/Plan    PT Assessment Patient needs continued PT services  PT Diagnosis Difficulty walking;Generalized weakness   PT Problem List Decreased strength;Decreased activity tolerance;Decreased range of motion;Decreased balance;Decreased mobility;Decreased knowledge of use of DME;Decreased safety awareness;Decreased knowledge of precautions  PT Treatment Interventions DME instruction;Gait training;Stair training;Functional mobility training;Therapeutic activities;Therapeutic exercise;Neuromuscular re-education;Patient/family education   PT Goals (Current goals can be found in the Care Plan section) Acute Rehab PT Goals Patient Stated Goal: get home and walk around PT Goal Formulation: With patient Time For Goal Achievement: 02/12/15 Potential to Achieve Goals: Fair    Frequency Min 3X/week   Barriers to discharge Decreased caregiver support Does not have 24 hour assist    Co-evaluation               End of Session Equipment Utilized During Treatment: Gait belt Activity Tolerance: Patient tolerated treatment well Patient left: in bed;with call bell/phone within reach Nurse Communication: Mobility status         Time: [id number] PT Time Calculation (min) (ACUTE ONLY): 9 min   Charges:   PT Evaluation $Initial PT Evaluation Tier I: 1 Procedure     PT G Codes:        Rolinda Roan 02-14-15, 1:19 PM   Rolinda Roan, PT, DPT Acute Rehabilitation Services Pager: [phone]

## 2015-02-05 NOTE — ED Notes (Signed)
Dr Niu at bedside 

## 2015-02-05 NOTE — Progress Notes (Signed)
This admission has been reviewed and determined not to meet inpatient level of care. Both attending Physician and Medical Director are in agreement this should be an Observation encounter according to the Medicare Conditions of Participation as set forth in CFR 42 Chapter 456 482.12 (c) and the Medicare Condition Code-44 Regulations CFR 42 Chapter 100 - 04 50.3. The Patient and/or Patient Representative was notified via delivery of the "MEDICARE OBSERVATION STATUS NOTIFICATION".              

## 2015-02-05 NOTE — Discharge Summary (Signed)
Physician Discharge Summary  Trevor Murphy [id number] DOB: 1935-08-10 DOA: 02/04/2015  PCP: Maximino Greenland, MD  Admit date: 02/04/2015 Discharge date: 02/05/2015  Time spent: 35 minutes  Recommendations for Outpatient Follow-up:  1. Please follow up blood pressures, patient was admitted for hypertensive urgency 2. Prior to discharge patient was set up with home health services for home PT and RN   Discharge Diagnoses:  Principal Problem:   Hypertensive urgency Active Problems:   Essential hypertension   CKD (chronic kidney disease) stage 3, GFR 30-59 ml/min   CVA (cerebral infarction)   Dyslipidemia   Alcohol abuse   Chronic diastolic CHF (congestive heart failure)   Mild coronary artery disease   GERD (gastroesophageal reflux disease)   Discharge Condition: Stable  Diet recommendation: Heart healthy  Filed Weights   02/04/15 1623 02/05/15 0140 02/05/15 0611  Weight: 74.39 kg (164 lb) 74.8 kg (164 lb 14.5 oz) 74.8 kg (164 lb 14.5 oz)    History of present illness:  Trevor Murphy is a 80 y.o. male with PMH of hypertension, hyperlipidemia, GERD, alcohol abuse ( did not drink for more than 3 months), mild CAD, chronic kidney disease-history, CVA, diastolic congestive heart failure, left eye blindness, worsening vision in right eye.   Patient reports that he has not been taking some of his blood pressure medications. He developed headache and elevaved blood pressure. He reports that he has left eye blindness, and very poor vision in right eye. He could see some thing in the right eye normally, but his right eye vision has worsened today. Patient was treated with clonidine, hydralazine and labetalol in emergency room. His blood pressure improved. When I saw patient in ED, he states that his HA and vision change have resolved after his blood pressure improved. Patient does not have chest pain, cough, shortness of breath. No new unilateral weakness in his extremities.  In ED, patient  was found to have elevated blood pressure 176/87. WBC 4.1, temperature normal, slightly bradycardia, stable renal function. CT head is negative for acute intracranial abnormalities, but showed right sphenoid sinusitis. Patient is admitted to inpatient for further evaluation and treatment.  Hospital Course:  Patient is a pleasant 80 year old gentleman with past history of hypertension, early residing alone, who was admitted to the medicine service and 01/28/2015 presenting with hypertensive urgency. Overnight had a blood pressure 190/94. He also reported having associated worsening blurry vision, particularly on his right. It appears that he had not been taking some of his blood pressure medications and subsequently reported having headaches in the visual changes. CT scan of brain without contrast showed stable atrophy and chronic small vessel ischemic change without acute intracranial abnormality. Patient was restarted on his home regimen with [place] with improvement to his blood pressures. Reported improvement to visual changes. Following morning blood pressures were stable at 133/76, heart rate of 72. He was and related down the hallway, noticed the presence of unsteady gait. Upon further questioning he reported having recent fall. Stated having a walker at home however mostly using cane. Case manager was consulted to set him up with home health services for physical therapy and RN. Please follow-up on blood pressures.   Discharge Exam: Filed Vitals:   02/05/15 1005  BP: 133/76  Pulse: 72  Temp:   Resp: 18    General: Patient reporting feeling better, visual changes resolved, is ready to go home today Cardiovascular: Regular rate rhythm normal S1-S2 Respiratory: Normal respiratory effort, lungs are clear to auscultation Abdomen: Soft  nontender nondistended Extremities: No edema  Discharge Instructions   Discharge Instructions    Call MD for:  difficulty breathing, headache or visual  disturbances    Complete by:  As directed      Call MD for:  extreme fatigue    Complete by:  As directed      Call MD for:  hives    Complete by:  As directed      Call MD for:  persistant dizziness or light-headedness    Complete by:  As directed      Call MD for:  persistant nausea and vomiting    Complete by:  As directed      Call MD for:  redness, tenderness, or signs of infection (pain, swelling, redness, odor or green/yellow discharge around incision site)    Complete by:  As directed      Call MD for:  severe uncontrolled pain    Complete by:  As directed      Call MD for:  temperature >100.4    Complete by:  As directed      Call MD for:    Complete by:  As directed      Diet - low sodium heart healthy    Complete by:  As directed      Increase activity slowly    Complete by:  As directed           Current Discharge Medication List    CONTINUE these medications which have CHANGED   Details  cloNIDine (CATAPRES) 0.1 MG tablet Take 1 tablet (0.1 mg total) by mouth 3 (three) times daily. Qty: 60 tablet, Refills: 1      CONTINUE these medications which have NOT CHANGED   Details  acetaminophen (TYLENOL) 500 MG tablet Take 1,000 mg by mouth every 6 (six) hours as needed for headache.    allopurinol (ZYLOPRIM) 100 MG tablet Take 100 mg by mouth daily.    aspirin EC 81 MG tablet Take 81 mg by mouth daily.    carvedilol (COREG) 12.5 MG tablet Take 1 tablet (12.5 mg total) by mouth 2 (two) times daily. Qty: 180 tablet, Refills: 3   Associated Diagnoses: Chronic diastolic heart failure; Hypertension, essential    hydrALAZINE (APRESOLINE) 100 MG tablet Take 1 tablet (100 mg total) by mouth every 8 (eight) hours. Qty: 90 tablet, Refills: 5    amLODipine (NORVASC) 10 MG tablet Take 1 tablet (10 mg total) by mouth daily. Qty: 30 tablet, Refills: 0    furosemide (LASIX) 20 MG tablet Take 1 tablet (20 mg total) by mouth every other day. Qty: 30 tablet, Refills: 3    Associated Diagnoses: Chronic kidney disease, stage III (moderate); Shortness of breath; Unspecified essential hypertension    isosorbide mononitrate (IMDUR) 30 MG 24 hr tablet Take 1 tablet (30 mg total) by mouth daily. Qty: 30 tablet, Refills: 0    nitroGLYCERIN (NITROSTAT) 0.4 MG SL tablet Place 1 tablet (0.4 mg total) under the tongue every 5 (five) minutes as needed for chest pain. Qty: 30 tablet, Refills: 0    ondansetron (ZOFRAN ODT) 4 MG disintegrating tablet Take 1 tablet (4 mg total) by mouth every 8 (eight) hours as needed for nausea. Qty: 10 tablet, Refills: 0    pantoprazole (PROTONIX) 40 MG tablet Take 1 tablet (40 mg total) by mouth daily. Qty: 60 tablet, Refills: 0    potassium chloride (K-DUR,KLOR-CON) 10 MEQ tablet Take 1 tablet (10 mEq total) by mouth every other day.  Qty: 30 tablet, Refills: 2   Associated Diagnoses: Chronic kidney disease, stage III (moderate); Shortness of breath; Unspecified essential hypertension    rosuvastatin (CRESTOR) 5 MG tablet Take 1 tablet (5 mg total) by mouth daily. Qty: 30 tablet, Refills: 6       No Known Allergies Follow-up Information    Follow up with Maximino Greenland, MD In 1 week.   Specialty:  Internal Medicine   Contact information:   [address] [place] [address] [phone]        The results of significant diagnostics from this hospitalization (including imaging, microbiology, ancillary and laboratory) are listed below for reference.    Significant Diagnostic Studies: Ct Head Wo Contrast  02/05/2015   CLINICAL DATA:  Headache, hypertension, loss of vision.  EXAM: CT HEAD WITHOUT CONTRAST  TECHNIQUE: Contiguous axial images were obtained from the base of the skull through the vertex without intravenous contrast.  COMPARISON:  Head CT 07/08/2014  FINDINGS: Generalized atrophy. Chronic small vessel ischemic change, stable from prior. Small remote right cerebellar infarct. No intracranial hemorrhage,  mass effect, or midline shift. No hydrocephalus. The basilar cisterns are patent. No evidence of territorial infarct. No intracranial fluid collection. Calvarium is intact. Improved aeration of the right sphenoid sinus with only minimal residual mucosal thickening. Remaining paranasal sinuses are well-aerated. Mastoid air cells are clear.  IMPRESSION: Stable atrophy and chronic small vessel ischemic change without acute intracranial abnormality.   Electronically Signed   By: Jeb Levering M.D.   On: 02/05/2015 00:54    Microbiology: No results found for this or any previous visit (from the past 240 hour(s)).   Labs: Basic Metabolic Panel:  Recent Labs Lab 01/31/15 1339 02/04/15 2102 02/05/15 0604  NA 134* 136 136  K 4.6 4.4 4.0  CL 101 104 107  CO2 27 21* 23  GLUCOSE 99 101* 96  BUN 13 16 17   CREATININE 1.59* 1.59* 1.53*  CALCIUM 9.1 8.8* 8.1*   Liver Function Tests:  Recent Labs Lab 02/04/15 2102  AST 19  ALT 11*  ALKPHOS 97  BILITOT 0.8  PROT 7.5  ALBUMIN 3.6   No results for input(s): LIPASE, AMYLASE in the last 168 hours. No results for input(s): AMMONIA in the last 168 hours. CBC:  Recent Labs Lab 02/04/15 2102 02/05/15 0604  WBC 4.1 3.7*  HGB 16.2 14.4  HCT 48.2 43.9  MCV 79.0 79.1  PLT 169 164   Cardiac Enzymes: No results for input(s): CKTOTAL, CKMB, CKMBINDEX, TROPONINI in the last 168 hours. BNP: BNP (last 3 results)  Recent Labs  02/05/15 0604  BNP 132.2*    ProBNP (last 3 results)  Recent Labs  04/11/14 1346 04/26/14 2022 07/25/14 1135  PROBNP 248.8 504.8* 39.0    CBG:  Recent Labs Lab 02/05/15 0609  GLUCAP 99       Signed:  Wendie Diskin  [facility] 02/05/2015, 10:41 AM

## 2015-02-05 NOTE — Progress Notes (Signed)
02/05/2015 01:30 AM The patient arrived from the ED. Patient is alert and oriented and having no pain currently.  Will monitor patient. Lupita Dawn

## 2015-02-05 NOTE — Progress Notes (Signed)
Utilization review completed.  

## 2015-02-05 NOTE — Progress Notes (Signed)
02/05/2015  6:22 AM This morning the patient shows signs of confusion.  The patient did not know where he was at or why he was here.  Will continue to monitor the patient. Lupita Dawn

## 2015-02-05 NOTE — Evaluation (Signed)
Occupational Therapy Evaluation Patient Details Name: Trevor Murphy MRN: [id number] DOB: 12-20-1934 Today's Date: 02/05/2015    History of Present Illness 80 y.o. admitted with headache and worsening vision in right eye. PMH of hypertension, hyperlipidemia, GERD, alcohol abuse ( did not drink for more than 3 months), mild CAD, chronic kidney disease-history, CVA, diastolic congestive heart failure, left eye blindness, worsening vision in right eye.    Clinical Impression   Pt admitted with above. Pt independent with BADLs, PTA (assist with IADLs). Feel pt will benefit from acute OT to increase independence and strength prior to d/c. Recommending HHOT and at least initial 24/7 assist.      Follow Up Recommendations  Home health OT;Supervision/Assistance - 24 hour    Equipment Recommendations  None recommended by OT    Recommendations for Other Services       Precautions / Restrictions Precautions Precautions: Fall Restrictions Weight Bearing Restrictions: No      Mobility Bed Mobility Overal bed mobility: Modified Independent                Transfers Overall transfer level: Needs assistance   Transfers: Sit to/from Stand Sit to Stand: Supervision         General transfer comment: supervision for safety    Balance  Unsteady at sink but no assist for balance-supervision for safety. Used RW for ambulation-Min guard (pt dizzy).                                          ADL Overall ADL's : Needs assistance/impaired     Grooming: Oral care;Wash/dry face;Standing;Set up;Supervision/safety (dryed hands)               Lower Body Dressing: Min guard;Sit to/from stand   Toilet Transfer: Min guard;Ambulation;RW (bed)           Functional mobility during ADLs: Min guard;Rolling walker General ADL Comments: Educated on safety such as safe footwear, use of bag/basket on walker, sitting for LB ADLs, and recommended someone be with him for  tub transfer and bathing. Recommended pt stand/sit still until dizziness eases off.     Vision Pt with blindness in left eye. Reports blurriness in right eye; wearing sunglasses in room. Vision Assessment?: Yes Visual Fields:  (No apparent deficits in right eye visual fields/blind in left eye)   Perception     Praxis      Pertinent Vitals/Pain Pain Assessment: No/denies pain     Hand Dominance     Extremity/Trunk Assessment Upper Extremity Assessment Upper Extremity Assessment: Generalized weakness;RUE deficits/detail;LUE deficits/detail RUE Coordination: decreased fine motor LUE Coordination: decreased fine motor   Lower Extremity Assessment Lower Extremity Assessment: Defer to PT evaluation       Communication Communication Communication: No difficulties   Cognition Arousal/Alertness: Awake/alert Behavior During Therapy: WFL for tasks assessed/performed Overall Cognitive Status: Within Functional Limits for tasks assessed                     General Comments       Exercises       Shoulder Instructions      Home Living Family/patient expects to be discharged to:: Private residence (independent living) Living Arrangements: Alone Available Help at Discharge: Family;Available PRN/intermittently (may be able to get family member to stay 24/7 ) Type of Home: Apartment  Bathroom Shower/Tub: Teacher, early years/pre: Standard     Home Equipment: Environmental consultant - 4 wheels;Grab bars - tub/shower;Bedside commode;Shower seat   Additional Comments: Please see PT evaluation for any additional information      Prior Functioning/Environment Level of Independence: Needs assistance    ADL's / Homemaking Assistance Needed: assist with IADLs, but independent with bathing/dressing        OT Diagnosis: Generalized weakness   OT Problem List: Decreased strength;Impaired balance (sitting and/or standing);Decreased knowledge of use of DME or  AE;Decreased knowledge of precautions;Decreased coordination;Impaired vision/perception   OT Treatment/Interventions: Self-care/ADL training;Therapeutic exercise;Balance training;Patient/family education;Visual/perceptual remediation/compensation;DME and/or AE instruction;Therapeutic activities    OT Goals(Current goals can be found in the care plan section) Acute Rehab OT Goals Patient Stated Goal: get home and walk around OT Goal Formulation: With patient Time For Goal Achievement: 02/12/15 Potential to Achieve Goals: Good ADL Goals Pt Will Perform Lower Body Bathing: with set-up;sit to/from stand Pt Will Perform Lower Body Dressing: with set-up;sit to/from stand Pt Will Transfer to Toilet: ambulating;with supervision Additional ADL Goal #1: Pt will independently perform HEP for bilateral UEs to increase strength.  OT Frequency: Min 2X/week   Barriers to D/C:            Co-evaluation              End of Session Equipment Utilized During Treatment: Gait belt;Rolling walker Nurse Communication: Other (comment) (d/c recommendations; dizzy)  Activity Tolerance: Patient tolerated treatment well Patient left: in bed;with call bell/phone within reach;with bed alarm set   Time: [id number] OT Time Calculation (min): 19 min Charges:  OT General Charges $OT Visit: 1 Procedure OT Evaluation $Initial OT Evaluation Tier I: 1 Procedure G-Codes: OT G-codes **NOT FOR INPATIENT CLASS** Functional Assessment Tool Used: clinical judgment Functional Limitation: Self care Self Care Current Status [id number]): At least 1 percent but less than 20 percent impaired, limited or restricted Self Care Goal Status [id number]): At least 1 percent but less than 20 percent impaired, limited or restricted  Benito Mccreedy OTR/L [id number] 02/05/2015, 12:56 PM

## 2015-02-05 NOTE — Care Management Note (Signed)
Case Management Note Marvetta Gibbons RN, BSN Unit 2W-Case Manager [phone]  Patient Details  Name: Trevor Murphy MRN: [id number] Date of Birth: November 28, 1934  Subjective/Objective:      Pt admitted with HTN              Action/Plan: PTA pt lived at home- senior living apartment-   Expected Discharge Date:      02/05/15           Expected Discharge Plan:  [place]  In-House Referral:     Discharge planning Services  CM Consult  Post Acute Care Choice:  Home Health Choice offered to:  Patient  DME Arranged:    DME Agency:     HH Arranged:  PT [place]:  [place]  Status of Service:  Completed, signed off  Medicare Important Message Given:    Date Medicare IM Given:    Medicare IM give by:    Date Additional Medicare IM Given:    Additional Medicare Important Message give by:     If discussed at [place] of Stay Meetings, dates discussed:    Additional Comments:  02/05/15- referral for HH-PT- spoke with pt at bedside- and choice offered for [facility] services in [facility]- per pt he has used [facility] in the past and would like to use them again for HH-PT- referral called to Santiago Glad with [facility] for HH-PT-  Per pt his phone # is correct in epic ([phone])- his address is [employer] [address] ([address]) [place]-- his apartment # is [address].   Dawayne Patricia, RN  02/05/2015, 11:32 AM

## 2015-02-05 NOTE — Progress Notes (Signed)
Pt discharge education and instructions completed with pt and family at bedside; all voices understanding and denies any questions. Pt IV and telemetry removed; pt discharge home with family to transport him home. Pt handed his prescription for clonidine; pt transported off unit via wheelchair with belongings and family at side. Francis Gaines Collie Wernick RN.

## 2015-02-07 NOTE — Progress Notes (Signed)
Physical Therapy Addendum for G-Codes    2015-02-26 1301  PT G-Codes **NOT FOR INPATIENT CLASS**  Functional Assessment Tool Used Clinical judgement  Functional Limitation Mobility: Walking and moving around  Mobility: Walking and Moving Around Current Status [phone]) CI  Mobility: Walking and Moving Around Goal Status [id number]) CI   Rolinda Roan, PT, DPT Acute Rehabilitation Services Pager: [phone]

## 2015-02-21 ENCOUNTER — Telehealth: Payer: Self-pay | Admitting: *Deleted

## 2015-02-21 DIAGNOSIS — I5032 Chronic diastolic (congestive) heart failure: Secondary | ICD-10-CM

## 2015-02-21 DIAGNOSIS — N183 Chronic kidney disease, stage 3 unspecified: Secondary | ICD-10-CM

## 2015-02-21 DIAGNOSIS — I10 Essential (primary) hypertension: Secondary | ICD-10-CM

## 2015-02-21 DIAGNOSIS — R0602 Shortness of breath: Secondary | ICD-10-CM

## 2015-02-21 NOTE — Telephone Encounter (Signed)
Copied from Dr Claris Gladden 02/01/15 office note:  HTN: BP running high. I will increase Coreg to 12.5 mg bid. His grand-daughter will check his BP daily for 2 wks and we will call for readings.  02/21/15: Epic notes indicate pt went to ED 02/05/15 with headache and worsening vision, BP 176/87. ED notes indicate pt had not been taking hydralazine and coreg. Pt states he had not had his BP checked  at home since 02/01/15 visit with Dr Aundra Dubin.  Pt states he saw his PCP yesterday and his BP was normal-?SBP/60-70, he could not remember SBP number.  Pt states PCP told him BP Ok and did not make any changes. Pt states HHN has been coming to his home and checking his BP, pt reports BP has been better, HHN due tomorrow to check BP. Pt states he has an appt today with his eye doctor, he was told in ED eye pressure was elevated and thought to be cause of blurred vision. Pt states he was given some eye drops for eye pressure and his vision is improving.   I will forward to Dr Aundra Dubin for review.

## 2015-03-07 MED ORDER — FUROSEMIDE 20 MG PO TABS: 20.0000 mg | ORAL_TABLET | ORAL | Status: DC

## 2015-03-07 MED ORDER — AMLODIPINE BESYLATE 10 MG PO TABS: 10.0000 mg | ORAL_TABLET | Freq: Every day | ORAL | Status: DC

## 2015-03-07 MED ORDER — HYDRALAZINE HCL 100 MG PO TABS: 100.0000 mg | ORAL_TABLET | Freq: Three times a day (TID) | ORAL | Status: DC

## 2015-03-07 MED ORDER — ISOSORBIDE MONONITRATE ER 30 MG PO TB24: 30.0000 mg | ORAL_TABLET | Freq: Every day | ORAL | Status: DC

## 2015-03-07 MED ORDER — CLONIDINE HCL 0.1 MG PO TABS: 0.1000 mg | ORAL_TABLET | Freq: Three times a day (TID) | ORAL | Status: DC

## 2015-03-07 MED ORDER — CARVEDILOL 12.5 MG PO TABS: 12.5000 mg | ORAL_TABLET | Freq: Two times a day (BID) | ORAL | Status: DC

## 2015-03-07 MED ORDER — POTASSIUM CHLORIDE CRYS ER 10 MEQ PO TBCR: 10.0000 meq | EXTENDED_RELEASE_TABLET | ORAL | Status: DC

## 2015-03-07 MED ORDER — ROSUVASTATIN CALCIUM 5 MG PO TABS: 5.0000 mg | ORAL_TABLET | Freq: Every day | ORAL | Status: DC

## 2015-03-07 MED ORDER — ASPIRIN EC 81 MG PO TBEC: 81.0000 mg | DELAYED_RELEASE_TABLET | Freq: Every day | ORAL | Status: DC

## 2015-03-07 NOTE — Telephone Encounter (Signed)
Trevor Murphy with [facility] called to review patients medication list during [facility] visit Pt recently in the hospital and some meds were changed Also pts b/p is constantly running high however pt only has clonidine, coreg, and allopurinol in the home pts has NOT had other HTN meds in over a year per pharmacy  Advised Larena Glassman will give this information to [place] as it was understood pt was compliant with meds New rx sent to [facility] Also PCP is adjusting meds as well (Dr. Baird Cancer), last office visit sent to office so everyone is on the same page  Larena Glassman reports she will go to the pharmacy to get meds to ensure he starts them. And will fill patients pill box. Continue to record b/p x 1 week and call office with readings

## 2015-03-07 NOTE — Telephone Encounter (Signed)
Restart amlodipine 10 mg daily, call patient in 2 weeks to get BP readings on this med.

## 2015-03-08 NOTE — Telephone Encounter (Signed)
Called patient.  Patient said his blood pressure is running a little high.  Blood pressure taken by [facility] nurse and was taken 2 days ago.  [name] Kitchen  Understands that he is to start Amlodipine 10 mg daily. Noticed Amlodipine was one of the medications refilled yestereday.  Told the patient if he does not get the Amlodipine to call and let us know and will also let [facility] nurse know he is starting it.   Asked patient to call back in 2 weeks with B/P readings

## 2015-04-17 ENCOUNTER — Telehealth (HOSPITAL_COMMUNITY): Payer: Self-pay | Admitting: *Deleted

## 2015-04-17 NOTE — Telephone Encounter (Signed)
Larena Glassman, RN called and left a VM on our Triage Line that pt needed a refill on his clonidine sent to [facility] on [address], pt is followed by Dr Aundra Dubin in the [facility] office, mess sent to them

## 2015-04-18 NOTE — Telephone Encounter (Signed)
Was sent in 03/07/15 for one year supply. Spoke with pharmacy and his rx is ready for pick up.

## 2015-06-12 DIAGNOSIS — I5032 Chronic diastolic (congestive) heart failure: Secondary | ICD-10-CM | POA: Diagnosis not present

## 2015-06-12 DIAGNOSIS — F1010 Alcohol abuse, uncomplicated: Secondary | ICD-10-CM | POA: Diagnosis not present

## 2015-06-12 DIAGNOSIS — I129 Hypertensive chronic kidney disease with stage 1 through stage 4 chronic kidney disease, or unspecified chronic kidney disease: Secondary | ICD-10-CM | POA: Diagnosis not present

## 2015-06-12 DIAGNOSIS — E785 Hyperlipidemia, unspecified: Secondary | ICD-10-CM | POA: Diagnosis not present

## 2015-06-12 DIAGNOSIS — I2510 Atherosclerotic heart disease of native coronary artery without angina pectoris: Secondary | ICD-10-CM | POA: Diagnosis not present

## 2015-06-12 DIAGNOSIS — Z8673 Personal history of transient ischemic attack (TIA), and cerebral infarction without residual deficits: Secondary | ICD-10-CM | POA: Diagnosis not present

## 2015-06-12 DIAGNOSIS — N183 Chronic kidney disease, stage 3 (moderate): Secondary | ICD-10-CM | POA: Diagnosis not present

## 2015-06-12 DIAGNOSIS — K219 Gastro-esophageal reflux disease without esophagitis: Secondary | ICD-10-CM | POA: Diagnosis not present

## 2015-06-18 DIAGNOSIS — I5032 Chronic diastolic (congestive) heart failure: Secondary | ICD-10-CM | POA: Diagnosis not present

## 2015-06-18 DIAGNOSIS — F1010 Alcohol abuse, uncomplicated: Secondary | ICD-10-CM | POA: Diagnosis not present

## 2015-06-18 DIAGNOSIS — Z8673 Personal history of transient ischemic attack (TIA), and cerebral infarction without residual deficits: Secondary | ICD-10-CM | POA: Diagnosis not present

## 2015-06-18 DIAGNOSIS — I129 Hypertensive chronic kidney disease with stage 1 through stage 4 chronic kidney disease, or unspecified chronic kidney disease: Secondary | ICD-10-CM | POA: Diagnosis not present

## 2015-06-18 DIAGNOSIS — I2510 Atherosclerotic heart disease of native coronary artery without angina pectoris: Secondary | ICD-10-CM | POA: Diagnosis not present

## 2015-06-18 DIAGNOSIS — K219 Gastro-esophageal reflux disease without esophagitis: Secondary | ICD-10-CM | POA: Diagnosis not present

## 2015-06-18 DIAGNOSIS — E785 Hyperlipidemia, unspecified: Secondary | ICD-10-CM | POA: Diagnosis not present

## 2015-06-18 DIAGNOSIS — N183 Chronic kidney disease, stage 3 (moderate): Secondary | ICD-10-CM | POA: Diagnosis not present

## 2015-06-26 DIAGNOSIS — I129 Hypertensive chronic kidney disease with stage 1 through stage 4 chronic kidney disease, or unspecified chronic kidney disease: Secondary | ICD-10-CM | POA: Diagnosis not present

## 2015-06-26 DIAGNOSIS — F1010 Alcohol abuse, uncomplicated: Secondary | ICD-10-CM | POA: Diagnosis not present

## 2015-06-26 DIAGNOSIS — N183 Chronic kidney disease, stage 3 (moderate): Secondary | ICD-10-CM | POA: Diagnosis not present

## 2015-06-26 DIAGNOSIS — I2510 Atherosclerotic heart disease of native coronary artery without angina pectoris: Secondary | ICD-10-CM | POA: Diagnosis not present

## 2015-06-26 DIAGNOSIS — I5032 Chronic diastolic (congestive) heart failure: Secondary | ICD-10-CM | POA: Diagnosis not present

## 2015-06-26 DIAGNOSIS — K219 Gastro-esophageal reflux disease without esophagitis: Secondary | ICD-10-CM | POA: Diagnosis not present

## 2015-06-26 DIAGNOSIS — Z8673 Personal history of transient ischemic attack (TIA), and cerebral infarction without residual deficits: Secondary | ICD-10-CM | POA: Diagnosis not present

## 2015-06-26 DIAGNOSIS — E785 Hyperlipidemia, unspecified: Secondary | ICD-10-CM | POA: Diagnosis not present

## 2015-07-01 DIAGNOSIS — Z9181 History of falling: Secondary | ICD-10-CM | POA: Diagnosis not present

## 2015-07-01 DIAGNOSIS — E782 Mixed hyperlipidemia: Secondary | ICD-10-CM | POA: Diagnosis not present

## 2015-07-01 DIAGNOSIS — N183 Chronic kidney disease, stage 3 (moderate): Secondary | ICD-10-CM | POA: Diagnosis not present

## 2015-07-01 DIAGNOSIS — I129 Hypertensive chronic kidney disease with stage 1 through stage 4 chronic kidney disease, or unspecified chronic kidney disease: Secondary | ICD-10-CM | POA: Diagnosis not present

## 2015-07-02 DIAGNOSIS — K219 Gastro-esophageal reflux disease without esophagitis: Secondary | ICD-10-CM | POA: Diagnosis not present

## 2015-07-02 DIAGNOSIS — I5032 Chronic diastolic (congestive) heart failure: Secondary | ICD-10-CM | POA: Diagnosis not present

## 2015-07-02 DIAGNOSIS — N183 Chronic kidney disease, stage 3 (moderate): Secondary | ICD-10-CM | POA: Diagnosis not present

## 2015-07-02 DIAGNOSIS — F1010 Alcohol abuse, uncomplicated: Secondary | ICD-10-CM | POA: Diagnosis not present

## 2015-07-02 DIAGNOSIS — I129 Hypertensive chronic kidney disease with stage 1 through stage 4 chronic kidney disease, or unspecified chronic kidney disease: Secondary | ICD-10-CM | POA: Diagnosis not present

## 2015-07-02 DIAGNOSIS — E785 Hyperlipidemia, unspecified: Secondary | ICD-10-CM | POA: Diagnosis not present

## 2015-07-02 DIAGNOSIS — Z8673 Personal history of transient ischemic attack (TIA), and cerebral infarction without residual deficits: Secondary | ICD-10-CM | POA: Diagnosis not present

## 2015-07-02 DIAGNOSIS — I2510 Atherosclerotic heart disease of native coronary artery without angina pectoris: Secondary | ICD-10-CM | POA: Diagnosis not present

## 2015-07-09 DIAGNOSIS — Z8673 Personal history of transient ischemic attack (TIA), and cerebral infarction without residual deficits: Secondary | ICD-10-CM | POA: Diagnosis not present

## 2015-07-09 DIAGNOSIS — E785 Hyperlipidemia, unspecified: Secondary | ICD-10-CM | POA: Diagnosis not present

## 2015-07-09 DIAGNOSIS — N183 Chronic kidney disease, stage 3 (moderate): Secondary | ICD-10-CM | POA: Diagnosis not present

## 2015-07-09 DIAGNOSIS — I2510 Atherosclerotic heart disease of native coronary artery without angina pectoris: Secondary | ICD-10-CM | POA: Diagnosis not present

## 2015-07-09 DIAGNOSIS — K219 Gastro-esophageal reflux disease without esophagitis: Secondary | ICD-10-CM | POA: Diagnosis not present

## 2015-07-09 DIAGNOSIS — F1010 Alcohol abuse, uncomplicated: Secondary | ICD-10-CM | POA: Diagnosis not present

## 2015-07-09 DIAGNOSIS — I5032 Chronic diastolic (congestive) heart failure: Secondary | ICD-10-CM | POA: Diagnosis not present

## 2015-07-09 DIAGNOSIS — I129 Hypertensive chronic kidney disease with stage 1 through stage 4 chronic kidney disease, or unspecified chronic kidney disease: Secondary | ICD-10-CM | POA: Diagnosis not present

## 2015-07-16 DIAGNOSIS — I129 Hypertensive chronic kidney disease with stage 1 through stage 4 chronic kidney disease, or unspecified chronic kidney disease: Secondary | ICD-10-CM | POA: Diagnosis not present

## 2015-07-16 DIAGNOSIS — N183 Chronic kidney disease, stage 3 (moderate): Secondary | ICD-10-CM | POA: Diagnosis not present

## 2015-07-16 DIAGNOSIS — I2510 Atherosclerotic heart disease of native coronary artery without angina pectoris: Secondary | ICD-10-CM | POA: Diagnosis not present

## 2015-07-16 DIAGNOSIS — E785 Hyperlipidemia, unspecified: Secondary | ICD-10-CM | POA: Diagnosis not present

## 2015-07-16 DIAGNOSIS — I5032 Chronic diastolic (congestive) heart failure: Secondary | ICD-10-CM | POA: Diagnosis not present

## 2015-07-16 DIAGNOSIS — F1010 Alcohol abuse, uncomplicated: Secondary | ICD-10-CM | POA: Diagnosis not present

## 2015-07-16 DIAGNOSIS — Z8673 Personal history of transient ischemic attack (TIA), and cerebral infarction without residual deficits: Secondary | ICD-10-CM | POA: Diagnosis not present

## 2015-07-16 DIAGNOSIS — K219 Gastro-esophageal reflux disease without esophagitis: Secondary | ICD-10-CM | POA: Diagnosis not present

## 2015-07-23 DIAGNOSIS — N183 Chronic kidney disease, stage 3 (moderate): Secondary | ICD-10-CM | POA: Diagnosis not present

## 2015-07-23 DIAGNOSIS — K219 Gastro-esophageal reflux disease without esophagitis: Secondary | ICD-10-CM | POA: Diagnosis not present

## 2015-07-23 DIAGNOSIS — I5032 Chronic diastolic (congestive) heart failure: Secondary | ICD-10-CM | POA: Diagnosis not present

## 2015-07-23 DIAGNOSIS — I2510 Atherosclerotic heart disease of native coronary artery without angina pectoris: Secondary | ICD-10-CM | POA: Diagnosis not present

## 2015-07-23 DIAGNOSIS — F1010 Alcohol abuse, uncomplicated: Secondary | ICD-10-CM | POA: Diagnosis not present

## 2015-07-23 DIAGNOSIS — E785 Hyperlipidemia, unspecified: Secondary | ICD-10-CM | POA: Diagnosis not present

## 2015-07-23 DIAGNOSIS — Z8673 Personal history of transient ischemic attack (TIA), and cerebral infarction without residual deficits: Secondary | ICD-10-CM | POA: Diagnosis not present

## 2015-07-23 DIAGNOSIS — I129 Hypertensive chronic kidney disease with stage 1 through stage 4 chronic kidney disease, or unspecified chronic kidney disease: Secondary | ICD-10-CM | POA: Diagnosis not present

## 2015-07-30 DIAGNOSIS — N183 Chronic kidney disease, stage 3 (moderate): Secondary | ICD-10-CM | POA: Diagnosis not present

## 2015-07-30 DIAGNOSIS — F1010 Alcohol abuse, uncomplicated: Secondary | ICD-10-CM | POA: Diagnosis not present

## 2015-07-30 DIAGNOSIS — I5032 Chronic diastolic (congestive) heart failure: Secondary | ICD-10-CM | POA: Diagnosis not present

## 2015-07-30 DIAGNOSIS — E785 Hyperlipidemia, unspecified: Secondary | ICD-10-CM | POA: Diagnosis not present

## 2015-07-30 DIAGNOSIS — I129 Hypertensive chronic kidney disease with stage 1 through stage 4 chronic kidney disease, or unspecified chronic kidney disease: Secondary | ICD-10-CM | POA: Diagnosis not present

## 2015-07-30 DIAGNOSIS — I2510 Atherosclerotic heart disease of native coronary artery without angina pectoris: Secondary | ICD-10-CM | POA: Diagnosis not present

## 2015-07-30 DIAGNOSIS — Z8673 Personal history of transient ischemic attack (TIA), and cerebral infarction without residual deficits: Secondary | ICD-10-CM | POA: Diagnosis not present

## 2015-07-30 DIAGNOSIS — K219 Gastro-esophageal reflux disease without esophagitis: Secondary | ICD-10-CM | POA: Diagnosis not present

## 2015-08-01 DIAGNOSIS — I129 Hypertensive chronic kidney disease with stage 1 through stage 4 chronic kidney disease, or unspecified chronic kidney disease: Secondary | ICD-10-CM | POA: Diagnosis not present

## 2015-08-01 DIAGNOSIS — N183 Chronic kidney disease, stage 3 (moderate): Secondary | ICD-10-CM | POA: Diagnosis not present

## 2015-08-01 DIAGNOSIS — I2510 Atherosclerotic heart disease of native coronary artery without angina pectoris: Secondary | ICD-10-CM | POA: Diagnosis not present

## 2015-08-01 DIAGNOSIS — I5032 Chronic diastolic (congestive) heart failure: Secondary | ICD-10-CM | POA: Diagnosis not present

## 2015-08-01 DIAGNOSIS — F1010 Alcohol abuse, uncomplicated: Secondary | ICD-10-CM | POA: Diagnosis not present

## 2015-08-01 DIAGNOSIS — Z8673 Personal history of transient ischemic attack (TIA), and cerebral infarction without residual deficits: Secondary | ICD-10-CM | POA: Diagnosis not present

## 2015-08-01 DIAGNOSIS — E785 Hyperlipidemia, unspecified: Secondary | ICD-10-CM | POA: Diagnosis not present

## 2015-08-01 DIAGNOSIS — K219 Gastro-esophageal reflux disease without esophagitis: Secondary | ICD-10-CM | POA: Diagnosis not present

## 2015-08-05 DIAGNOSIS — I5032 Chronic diastolic (congestive) heart failure: Secondary | ICD-10-CM | POA: Diagnosis not present

## 2015-08-05 DIAGNOSIS — I129 Hypertensive chronic kidney disease with stage 1 through stage 4 chronic kidney disease, or unspecified chronic kidney disease: Secondary | ICD-10-CM | POA: Diagnosis not present

## 2015-08-05 DIAGNOSIS — N183 Chronic kidney disease, stage 3 (moderate): Secondary | ICD-10-CM | POA: Diagnosis not present

## 2015-08-05 DIAGNOSIS — I2510 Atherosclerotic heart disease of native coronary artery without angina pectoris: Secondary | ICD-10-CM | POA: Diagnosis not present

## 2015-08-06 DIAGNOSIS — F1010 Alcohol abuse, uncomplicated: Secondary | ICD-10-CM | POA: Diagnosis not present

## 2015-08-06 DIAGNOSIS — K219 Gastro-esophageal reflux disease without esophagitis: Secondary | ICD-10-CM | POA: Diagnosis not present

## 2015-08-06 DIAGNOSIS — Z8673 Personal history of transient ischemic attack (TIA), and cerebral infarction without residual deficits: Secondary | ICD-10-CM | POA: Diagnosis not present

## 2015-08-06 DIAGNOSIS — I129 Hypertensive chronic kidney disease with stage 1 through stage 4 chronic kidney disease, or unspecified chronic kidney disease: Secondary | ICD-10-CM | POA: Diagnosis not present

## 2015-08-06 DIAGNOSIS — I5032 Chronic diastolic (congestive) heart failure: Secondary | ICD-10-CM | POA: Diagnosis not present

## 2015-08-06 DIAGNOSIS — N183 Chronic kidney disease, stage 3 (moderate): Secondary | ICD-10-CM | POA: Diagnosis not present

## 2015-08-06 DIAGNOSIS — E785 Hyperlipidemia, unspecified: Secondary | ICD-10-CM | POA: Diagnosis not present

## 2015-08-06 DIAGNOSIS — I2510 Atherosclerotic heart disease of native coronary artery without angina pectoris: Secondary | ICD-10-CM | POA: Diagnosis not present

## 2015-08-13 DIAGNOSIS — K219 Gastro-esophageal reflux disease without esophagitis: Secondary | ICD-10-CM | POA: Diagnosis not present

## 2015-08-13 DIAGNOSIS — I2510 Atherosclerotic heart disease of native coronary artery without angina pectoris: Secondary | ICD-10-CM | POA: Diagnosis not present

## 2015-08-13 DIAGNOSIS — I5032 Chronic diastolic (congestive) heart failure: Secondary | ICD-10-CM | POA: Diagnosis not present

## 2015-08-13 DIAGNOSIS — I129 Hypertensive chronic kidney disease with stage 1 through stage 4 chronic kidney disease, or unspecified chronic kidney disease: Secondary | ICD-10-CM | POA: Diagnosis not present

## 2015-08-13 DIAGNOSIS — E785 Hyperlipidemia, unspecified: Secondary | ICD-10-CM | POA: Diagnosis not present

## 2015-08-13 DIAGNOSIS — N183 Chronic kidney disease, stage 3 (moderate): Secondary | ICD-10-CM | POA: Diagnosis not present

## 2015-08-13 DIAGNOSIS — Z8673 Personal history of transient ischemic attack (TIA), and cerebral infarction without residual deficits: Secondary | ICD-10-CM | POA: Diagnosis not present

## 2015-08-19 DIAGNOSIS — E785 Hyperlipidemia, unspecified: Secondary | ICD-10-CM | POA: Diagnosis not present

## 2015-08-19 DIAGNOSIS — I5032 Chronic diastolic (congestive) heart failure: Secondary | ICD-10-CM | POA: Diagnosis not present

## 2015-08-19 DIAGNOSIS — I129 Hypertensive chronic kidney disease with stage 1 through stage 4 chronic kidney disease, or unspecified chronic kidney disease: Secondary | ICD-10-CM | POA: Diagnosis not present

## 2015-08-19 DIAGNOSIS — K219 Gastro-esophageal reflux disease without esophagitis: Secondary | ICD-10-CM | POA: Diagnosis not present

## 2015-08-19 DIAGNOSIS — N183 Chronic kidney disease, stage 3 (moderate): Secondary | ICD-10-CM | POA: Diagnosis not present

## 2015-08-19 DIAGNOSIS — Z8673 Personal history of transient ischemic attack (TIA), and cerebral infarction without residual deficits: Secondary | ICD-10-CM | POA: Diagnosis not present

## 2015-08-19 DIAGNOSIS — I2510 Atherosclerotic heart disease of native coronary artery without angina pectoris: Secondary | ICD-10-CM | POA: Diagnosis not present

## 2015-08-27 DIAGNOSIS — I129 Hypertensive chronic kidney disease with stage 1 through stage 4 chronic kidney disease, or unspecified chronic kidney disease: Secondary | ICD-10-CM | POA: Diagnosis not present

## 2015-08-27 DIAGNOSIS — Z8673 Personal history of transient ischemic attack (TIA), and cerebral infarction without residual deficits: Secondary | ICD-10-CM | POA: Diagnosis not present

## 2015-08-27 DIAGNOSIS — I2510 Atherosclerotic heart disease of native coronary artery without angina pectoris: Secondary | ICD-10-CM | POA: Diagnosis not present

## 2015-08-27 DIAGNOSIS — I5032 Chronic diastolic (congestive) heart failure: Secondary | ICD-10-CM | POA: Diagnosis not present

## 2015-08-27 DIAGNOSIS — E785 Hyperlipidemia, unspecified: Secondary | ICD-10-CM | POA: Diagnosis not present

## 2015-08-27 DIAGNOSIS — N183 Chronic kidney disease, stage 3 (moderate): Secondary | ICD-10-CM | POA: Diagnosis not present

## 2015-08-27 DIAGNOSIS — K219 Gastro-esophageal reflux disease without esophagitis: Secondary | ICD-10-CM | POA: Diagnosis not present

## 2015-09-03 DIAGNOSIS — I129 Hypertensive chronic kidney disease with stage 1 through stage 4 chronic kidney disease, or unspecified chronic kidney disease: Secondary | ICD-10-CM | POA: Diagnosis not present

## 2015-09-03 DIAGNOSIS — E785 Hyperlipidemia, unspecified: Secondary | ICD-10-CM | POA: Diagnosis not present

## 2015-09-03 DIAGNOSIS — K219 Gastro-esophageal reflux disease without esophagitis: Secondary | ICD-10-CM | POA: Diagnosis not present

## 2015-09-03 DIAGNOSIS — I2510 Atherosclerotic heart disease of native coronary artery without angina pectoris: Secondary | ICD-10-CM | POA: Diagnosis not present

## 2015-09-03 DIAGNOSIS — N183 Chronic kidney disease, stage 3 (moderate): Secondary | ICD-10-CM | POA: Diagnosis not present

## 2015-09-03 DIAGNOSIS — I5032 Chronic diastolic (congestive) heart failure: Secondary | ICD-10-CM | POA: Diagnosis not present

## 2015-09-03 DIAGNOSIS — Z8673 Personal history of transient ischemic attack (TIA), and cerebral infarction without residual deficits: Secondary | ICD-10-CM | POA: Diagnosis not present

## 2015-09-10 DIAGNOSIS — N183 Chronic kidney disease, stage 3 (moderate): Secondary | ICD-10-CM | POA: Diagnosis not present

## 2015-09-10 DIAGNOSIS — I5032 Chronic diastolic (congestive) heart failure: Secondary | ICD-10-CM | POA: Diagnosis not present

## 2015-09-10 DIAGNOSIS — Z8673 Personal history of transient ischemic attack (TIA), and cerebral infarction without residual deficits: Secondary | ICD-10-CM | POA: Diagnosis not present

## 2015-09-10 DIAGNOSIS — K219 Gastro-esophageal reflux disease without esophagitis: Secondary | ICD-10-CM | POA: Diagnosis not present

## 2015-09-10 DIAGNOSIS — I2510 Atherosclerotic heart disease of native coronary artery without angina pectoris: Secondary | ICD-10-CM | POA: Diagnosis not present

## 2015-09-10 DIAGNOSIS — I129 Hypertensive chronic kidney disease with stage 1 through stage 4 chronic kidney disease, or unspecified chronic kidney disease: Secondary | ICD-10-CM | POA: Diagnosis not present

## 2015-09-10 DIAGNOSIS — E785 Hyperlipidemia, unspecified: Secondary | ICD-10-CM | POA: Diagnosis not present

## 2015-09-17 DIAGNOSIS — I129 Hypertensive chronic kidney disease with stage 1 through stage 4 chronic kidney disease, or unspecified chronic kidney disease: Secondary | ICD-10-CM | POA: Diagnosis not present

## 2015-09-17 DIAGNOSIS — K219 Gastro-esophageal reflux disease without esophagitis: Secondary | ICD-10-CM | POA: Diagnosis not present

## 2015-09-17 DIAGNOSIS — N183 Chronic kidney disease, stage 3 (moderate): Secondary | ICD-10-CM | POA: Diagnosis not present

## 2015-09-17 DIAGNOSIS — Z8673 Personal history of transient ischemic attack (TIA), and cerebral infarction without residual deficits: Secondary | ICD-10-CM | POA: Diagnosis not present

## 2015-09-17 DIAGNOSIS — I5032 Chronic diastolic (congestive) heart failure: Secondary | ICD-10-CM | POA: Diagnosis not present

## 2015-09-17 DIAGNOSIS — I2510 Atherosclerotic heart disease of native coronary artery without angina pectoris: Secondary | ICD-10-CM | POA: Diagnosis not present

## 2015-09-17 DIAGNOSIS — E785 Hyperlipidemia, unspecified: Secondary | ICD-10-CM | POA: Diagnosis not present

## 2015-09-23 DIAGNOSIS — I2510 Atherosclerotic heart disease of native coronary artery without angina pectoris: Secondary | ICD-10-CM | POA: Diagnosis not present

## 2015-09-23 DIAGNOSIS — H5712 Ocular pain, left eye: Secondary | ICD-10-CM | POA: Diagnosis not present

## 2015-09-23 DIAGNOSIS — K219 Gastro-esophageal reflux disease without esophagitis: Secondary | ICD-10-CM | POA: Diagnosis not present

## 2015-09-23 DIAGNOSIS — Z9181 History of falling: Secondary | ICD-10-CM | POA: Diagnosis not present

## 2015-09-23 DIAGNOSIS — Z8673 Personal history of transient ischemic attack (TIA), and cerebral infarction without residual deficits: Secondary | ICD-10-CM | POA: Diagnosis not present

## 2015-09-23 DIAGNOSIS — E785 Hyperlipidemia, unspecified: Secondary | ICD-10-CM | POA: Diagnosis not present

## 2015-09-23 DIAGNOSIS — I129 Hypertensive chronic kidney disease with stage 1 through stage 4 chronic kidney disease, or unspecified chronic kidney disease: Secondary | ICD-10-CM | POA: Diagnosis not present

## 2015-09-23 DIAGNOSIS — N183 Chronic kidney disease, stage 3 (moderate): Secondary | ICD-10-CM | POA: Diagnosis not present

## 2015-09-23 DIAGNOSIS — I5032 Chronic diastolic (congestive) heart failure: Secondary | ICD-10-CM | POA: Diagnosis not present

## 2015-09-24 DIAGNOSIS — H401133 Primary open-angle glaucoma, bilateral, severe stage: Secondary | ICD-10-CM | POA: Diagnosis not present

## 2015-09-24 DIAGNOSIS — H4320 Crystalline deposits in vitreous body, unspecified eye: Secondary | ICD-10-CM | POA: Diagnosis not present

## 2015-09-24 DIAGNOSIS — H4312 Vitreous hemorrhage, left eye: Secondary | ICD-10-CM | POA: Diagnosis not present

## 2015-09-25 DIAGNOSIS — I5032 Chronic diastolic (congestive) heart failure: Secondary | ICD-10-CM | POA: Diagnosis not present

## 2015-09-25 DIAGNOSIS — K219 Gastro-esophageal reflux disease without esophagitis: Secondary | ICD-10-CM | POA: Diagnosis not present

## 2015-09-25 DIAGNOSIS — I129 Hypertensive chronic kidney disease with stage 1 through stage 4 chronic kidney disease, or unspecified chronic kidney disease: Secondary | ICD-10-CM | POA: Diagnosis not present

## 2015-09-25 DIAGNOSIS — Z8673 Personal history of transient ischemic attack (TIA), and cerebral infarction without residual deficits: Secondary | ICD-10-CM | POA: Diagnosis not present

## 2015-09-25 DIAGNOSIS — I2510 Atherosclerotic heart disease of native coronary artery without angina pectoris: Secondary | ICD-10-CM | POA: Diagnosis not present

## 2015-09-25 DIAGNOSIS — N183 Chronic kidney disease, stage 3 (moderate): Secondary | ICD-10-CM | POA: Diagnosis not present

## 2015-09-25 DIAGNOSIS — E785 Hyperlipidemia, unspecified: Secondary | ICD-10-CM | POA: Diagnosis not present

## 2015-09-26 DIAGNOSIS — I129 Hypertensive chronic kidney disease with stage 1 through stage 4 chronic kidney disease, or unspecified chronic kidney disease: Secondary | ICD-10-CM | POA: Diagnosis not present

## 2015-09-26 DIAGNOSIS — N183 Chronic kidney disease, stage 3 (moderate): Secondary | ICD-10-CM | POA: Diagnosis not present

## 2015-09-26 DIAGNOSIS — I2510 Atherosclerotic heart disease of native coronary artery without angina pectoris: Secondary | ICD-10-CM | POA: Diagnosis not present

## 2015-09-26 DIAGNOSIS — K219 Gastro-esophageal reflux disease without esophagitis: Secondary | ICD-10-CM | POA: Diagnosis not present

## 2015-09-26 DIAGNOSIS — I5032 Chronic diastolic (congestive) heart failure: Secondary | ICD-10-CM | POA: Diagnosis not present

## 2015-09-26 DIAGNOSIS — Z8673 Personal history of transient ischemic attack (TIA), and cerebral infarction without residual deficits: Secondary | ICD-10-CM | POA: Diagnosis not present

## 2015-09-26 DIAGNOSIS — E785 Hyperlipidemia, unspecified: Secondary | ICD-10-CM | POA: Diagnosis not present

## 2015-09-30 DIAGNOSIS — K219 Gastro-esophageal reflux disease without esophagitis: Secondary | ICD-10-CM | POA: Diagnosis not present

## 2015-09-30 DIAGNOSIS — N183 Chronic kidney disease, stage 3 (moderate): Secondary | ICD-10-CM | POA: Diagnosis not present

## 2015-09-30 DIAGNOSIS — I5032 Chronic diastolic (congestive) heart failure: Secondary | ICD-10-CM | POA: Diagnosis not present

## 2015-09-30 DIAGNOSIS — I129 Hypertensive chronic kidney disease with stage 1 through stage 4 chronic kidney disease, or unspecified chronic kidney disease: Secondary | ICD-10-CM | POA: Diagnosis not present

## 2015-09-30 DIAGNOSIS — Z8673 Personal history of transient ischemic attack (TIA), and cerebral infarction without residual deficits: Secondary | ICD-10-CM | POA: Diagnosis not present

## 2015-09-30 DIAGNOSIS — I2510 Atherosclerotic heart disease of native coronary artery without angina pectoris: Secondary | ICD-10-CM | POA: Diagnosis not present

## 2015-09-30 DIAGNOSIS — E785 Hyperlipidemia, unspecified: Secondary | ICD-10-CM | POA: Diagnosis not present

## 2015-10-02 DIAGNOSIS — K219 Gastro-esophageal reflux disease without esophagitis: Secondary | ICD-10-CM | POA: Diagnosis not present

## 2015-10-02 DIAGNOSIS — I2510 Atherosclerotic heart disease of native coronary artery without angina pectoris: Secondary | ICD-10-CM | POA: Diagnosis not present

## 2015-10-02 DIAGNOSIS — E785 Hyperlipidemia, unspecified: Secondary | ICD-10-CM | POA: Diagnosis not present

## 2015-10-02 DIAGNOSIS — I5032 Chronic diastolic (congestive) heart failure: Secondary | ICD-10-CM | POA: Diagnosis not present

## 2015-10-02 DIAGNOSIS — Z8673 Personal history of transient ischemic attack (TIA), and cerebral infarction without residual deficits: Secondary | ICD-10-CM | POA: Diagnosis not present

## 2015-10-02 DIAGNOSIS — N183 Chronic kidney disease, stage 3 (moderate): Secondary | ICD-10-CM | POA: Diagnosis not present

## 2015-10-02 DIAGNOSIS — I129 Hypertensive chronic kidney disease with stage 1 through stage 4 chronic kidney disease, or unspecified chronic kidney disease: Secondary | ICD-10-CM | POA: Diagnosis not present

## 2015-10-04 DIAGNOSIS — I2510 Atherosclerotic heart disease of native coronary artery without angina pectoris: Secondary | ICD-10-CM | POA: Diagnosis not present

## 2015-10-04 DIAGNOSIS — I5032 Chronic diastolic (congestive) heart failure: Secondary | ICD-10-CM | POA: Diagnosis not present

## 2015-10-04 DIAGNOSIS — I129 Hypertensive chronic kidney disease with stage 1 through stage 4 chronic kidney disease, or unspecified chronic kidney disease: Secondary | ICD-10-CM | POA: Diagnosis not present

## 2015-10-04 DIAGNOSIS — N183 Chronic kidney disease, stage 3 (moderate): Secondary | ICD-10-CM | POA: Diagnosis not present

## 2015-10-07 DIAGNOSIS — I2510 Atherosclerotic heart disease of native coronary artery without angina pectoris: Secondary | ICD-10-CM | POA: Diagnosis not present

## 2015-10-07 DIAGNOSIS — K219 Gastro-esophageal reflux disease without esophagitis: Secondary | ICD-10-CM | POA: Diagnosis not present

## 2015-10-07 DIAGNOSIS — N183 Chronic kidney disease, stage 3 (moderate): Secondary | ICD-10-CM | POA: Diagnosis not present

## 2015-10-07 DIAGNOSIS — Z8673 Personal history of transient ischemic attack (TIA), and cerebral infarction without residual deficits: Secondary | ICD-10-CM | POA: Diagnosis not present

## 2015-10-07 DIAGNOSIS — I5032 Chronic diastolic (congestive) heart failure: Secondary | ICD-10-CM | POA: Diagnosis not present

## 2015-10-07 DIAGNOSIS — I129 Hypertensive chronic kidney disease with stage 1 through stage 4 chronic kidney disease, or unspecified chronic kidney disease: Secondary | ICD-10-CM | POA: Diagnosis not present

## 2015-10-07 DIAGNOSIS — E785 Hyperlipidemia, unspecified: Secondary | ICD-10-CM | POA: Diagnosis not present

## 2015-10-09 DIAGNOSIS — E785 Hyperlipidemia, unspecified: Secondary | ICD-10-CM | POA: Diagnosis not present

## 2015-10-09 DIAGNOSIS — K219 Gastro-esophageal reflux disease without esophagitis: Secondary | ICD-10-CM | POA: Diagnosis not present

## 2015-10-09 DIAGNOSIS — I5032 Chronic diastolic (congestive) heart failure: Secondary | ICD-10-CM | POA: Diagnosis not present

## 2015-10-09 DIAGNOSIS — I129 Hypertensive chronic kidney disease with stage 1 through stage 4 chronic kidney disease, or unspecified chronic kidney disease: Secondary | ICD-10-CM | POA: Diagnosis not present

## 2015-10-09 DIAGNOSIS — I2510 Atherosclerotic heart disease of native coronary artery without angina pectoris: Secondary | ICD-10-CM | POA: Diagnosis not present

## 2015-10-09 DIAGNOSIS — Z8673 Personal history of transient ischemic attack (TIA), and cerebral infarction without residual deficits: Secondary | ICD-10-CM | POA: Diagnosis not present

## 2015-10-09 DIAGNOSIS — N183 Chronic kidney disease, stage 3 (moderate): Secondary | ICD-10-CM | POA: Diagnosis not present

## 2015-10-13 DIAGNOSIS — E785 Hyperlipidemia, unspecified: Secondary | ICD-10-CM | POA: Diagnosis not present

## 2015-10-13 DIAGNOSIS — I129 Hypertensive chronic kidney disease with stage 1 through stage 4 chronic kidney disease, or unspecified chronic kidney disease: Secondary | ICD-10-CM | POA: Diagnosis not present

## 2015-10-13 DIAGNOSIS — I2510 Atherosclerotic heart disease of native coronary artery without angina pectoris: Secondary | ICD-10-CM | POA: Diagnosis not present

## 2015-10-13 DIAGNOSIS — I5032 Chronic diastolic (congestive) heart failure: Secondary | ICD-10-CM | POA: Diagnosis not present

## 2015-10-13 DIAGNOSIS — K219 Gastro-esophageal reflux disease without esophagitis: Secondary | ICD-10-CM | POA: Diagnosis not present

## 2015-10-13 DIAGNOSIS — Z8673 Personal history of transient ischemic attack (TIA), and cerebral infarction without residual deficits: Secondary | ICD-10-CM | POA: Diagnosis not present

## 2015-10-13 DIAGNOSIS — N183 Chronic kidney disease, stage 3 (moderate): Secondary | ICD-10-CM | POA: Diagnosis not present

## 2015-10-14 DIAGNOSIS — K219 Gastro-esophageal reflux disease without esophagitis: Secondary | ICD-10-CM | POA: Diagnosis not present

## 2015-10-14 DIAGNOSIS — I5032 Chronic diastolic (congestive) heart failure: Secondary | ICD-10-CM | POA: Diagnosis not present

## 2015-10-14 DIAGNOSIS — E785 Hyperlipidemia, unspecified: Secondary | ICD-10-CM | POA: Diagnosis not present

## 2015-10-14 DIAGNOSIS — I2510 Atherosclerotic heart disease of native coronary artery without angina pectoris: Secondary | ICD-10-CM | POA: Diagnosis not present

## 2015-10-14 DIAGNOSIS — I129 Hypertensive chronic kidney disease with stage 1 through stage 4 chronic kidney disease, or unspecified chronic kidney disease: Secondary | ICD-10-CM | POA: Diagnosis not present

## 2015-10-14 DIAGNOSIS — N183 Chronic kidney disease, stage 3 (moderate): Secondary | ICD-10-CM | POA: Diagnosis not present

## 2015-10-14 DIAGNOSIS — Z8673 Personal history of transient ischemic attack (TIA), and cerebral infarction without residual deficits: Secondary | ICD-10-CM | POA: Diagnosis not present

## 2015-10-15 DIAGNOSIS — K219 Gastro-esophageal reflux disease without esophagitis: Secondary | ICD-10-CM | POA: Diagnosis not present

## 2015-10-15 DIAGNOSIS — I5032 Chronic diastolic (congestive) heart failure: Secondary | ICD-10-CM | POA: Diagnosis not present

## 2015-10-15 DIAGNOSIS — Z8673 Personal history of transient ischemic attack (TIA), and cerebral infarction without residual deficits: Secondary | ICD-10-CM | POA: Diagnosis not present

## 2015-10-15 DIAGNOSIS — I2510 Atherosclerotic heart disease of native coronary artery without angina pectoris: Secondary | ICD-10-CM | POA: Diagnosis not present

## 2015-10-15 DIAGNOSIS — N183 Chronic kidney disease, stage 3 (moderate): Secondary | ICD-10-CM | POA: Diagnosis not present

## 2015-10-15 DIAGNOSIS — I129 Hypertensive chronic kidney disease with stage 1 through stage 4 chronic kidney disease, or unspecified chronic kidney disease: Secondary | ICD-10-CM | POA: Diagnosis not present

## 2015-10-15 DIAGNOSIS — E785 Hyperlipidemia, unspecified: Secondary | ICD-10-CM | POA: Diagnosis not present

## 2015-10-21 DIAGNOSIS — K219 Gastro-esophageal reflux disease without esophagitis: Secondary | ICD-10-CM | POA: Diagnosis not present

## 2015-10-21 DIAGNOSIS — I5032 Chronic diastolic (congestive) heart failure: Secondary | ICD-10-CM | POA: Diagnosis not present

## 2015-10-21 DIAGNOSIS — N183 Chronic kidney disease, stage 3 (moderate): Secondary | ICD-10-CM | POA: Diagnosis not present

## 2015-10-21 DIAGNOSIS — I129 Hypertensive chronic kidney disease with stage 1 through stage 4 chronic kidney disease, or unspecified chronic kidney disease: Secondary | ICD-10-CM | POA: Diagnosis not present

## 2015-10-21 DIAGNOSIS — E785 Hyperlipidemia, unspecified: Secondary | ICD-10-CM | POA: Diagnosis not present

## 2015-10-21 DIAGNOSIS — Z8673 Personal history of transient ischemic attack (TIA), and cerebral infarction without residual deficits: Secondary | ICD-10-CM | POA: Diagnosis not present

## 2015-10-21 DIAGNOSIS — I2510 Atherosclerotic heart disease of native coronary artery without angina pectoris: Secondary | ICD-10-CM | POA: Diagnosis not present

## 2015-10-28 DIAGNOSIS — I2510 Atherosclerotic heart disease of native coronary artery without angina pectoris: Secondary | ICD-10-CM | POA: Diagnosis not present

## 2015-10-28 DIAGNOSIS — E785 Hyperlipidemia, unspecified: Secondary | ICD-10-CM | POA: Diagnosis not present

## 2015-10-28 DIAGNOSIS — N183 Chronic kidney disease, stage 3 (moderate): Secondary | ICD-10-CM | POA: Diagnosis not present

## 2015-10-28 DIAGNOSIS — I129 Hypertensive chronic kidney disease with stage 1 through stage 4 chronic kidney disease, or unspecified chronic kidney disease: Secondary | ICD-10-CM | POA: Diagnosis not present

## 2015-10-28 DIAGNOSIS — Z8673 Personal history of transient ischemic attack (TIA), and cerebral infarction without residual deficits: Secondary | ICD-10-CM | POA: Diagnosis not present

## 2015-10-28 DIAGNOSIS — I5032 Chronic diastolic (congestive) heart failure: Secondary | ICD-10-CM | POA: Diagnosis not present

## 2015-10-28 DIAGNOSIS — K219 Gastro-esophageal reflux disease without esophagitis: Secondary | ICD-10-CM | POA: Diagnosis not present

## 2015-11-04 DIAGNOSIS — N183 Chronic kidney disease, stage 3 (moderate): Secondary | ICD-10-CM | POA: Diagnosis not present

## 2015-11-04 DIAGNOSIS — I5032 Chronic diastolic (congestive) heart failure: Secondary | ICD-10-CM | POA: Diagnosis not present

## 2015-11-04 DIAGNOSIS — E785 Hyperlipidemia, unspecified: Secondary | ICD-10-CM | POA: Diagnosis not present

## 2015-11-04 DIAGNOSIS — Z8673 Personal history of transient ischemic attack (TIA), and cerebral infarction without residual deficits: Secondary | ICD-10-CM | POA: Diagnosis not present

## 2015-11-04 DIAGNOSIS — I129 Hypertensive chronic kidney disease with stage 1 through stage 4 chronic kidney disease, or unspecified chronic kidney disease: Secondary | ICD-10-CM | POA: Diagnosis not present

## 2015-11-04 DIAGNOSIS — I2510 Atherosclerotic heart disease of native coronary artery without angina pectoris: Secondary | ICD-10-CM | POA: Diagnosis not present

## 2015-11-04 DIAGNOSIS — K219 Gastro-esophageal reflux disease without esophagitis: Secondary | ICD-10-CM | POA: Diagnosis not present

## 2015-11-12 DIAGNOSIS — Z8673 Personal history of transient ischemic attack (TIA), and cerebral infarction without residual deficits: Secondary | ICD-10-CM | POA: Diagnosis not present

## 2015-11-12 DIAGNOSIS — I129 Hypertensive chronic kidney disease with stage 1 through stage 4 chronic kidney disease, or unspecified chronic kidney disease: Secondary | ICD-10-CM | POA: Diagnosis not present

## 2015-11-12 DIAGNOSIS — I2510 Atherosclerotic heart disease of native coronary artery without angina pectoris: Secondary | ICD-10-CM | POA: Diagnosis not present

## 2015-11-12 DIAGNOSIS — K219 Gastro-esophageal reflux disease without esophagitis: Secondary | ICD-10-CM | POA: Diagnosis not present

## 2015-11-12 DIAGNOSIS — N183 Chronic kidney disease, stage 3 (moderate): Secondary | ICD-10-CM | POA: Diagnosis not present

## 2015-11-12 DIAGNOSIS — I5032 Chronic diastolic (congestive) heart failure: Secondary | ICD-10-CM | POA: Diagnosis not present

## 2015-11-12 DIAGNOSIS — E785 Hyperlipidemia, unspecified: Secondary | ICD-10-CM | POA: Diagnosis not present

## 2015-11-18 DIAGNOSIS — I129 Hypertensive chronic kidney disease with stage 1 through stage 4 chronic kidney disease, or unspecified chronic kidney disease: Secondary | ICD-10-CM | POA: Diagnosis not present

## 2015-11-18 DIAGNOSIS — I2510 Atherosclerotic heart disease of native coronary artery without angina pectoris: Secondary | ICD-10-CM | POA: Diagnosis not present

## 2015-11-18 DIAGNOSIS — N183 Chronic kidney disease, stage 3 (moderate): Secondary | ICD-10-CM | POA: Diagnosis not present

## 2015-11-18 DIAGNOSIS — E785 Hyperlipidemia, unspecified: Secondary | ICD-10-CM | POA: Diagnosis not present

## 2015-11-18 DIAGNOSIS — I5032 Chronic diastolic (congestive) heart failure: Secondary | ICD-10-CM | POA: Diagnosis not present

## 2015-11-18 DIAGNOSIS — Z8673 Personal history of transient ischemic attack (TIA), and cerebral infarction without residual deficits: Secondary | ICD-10-CM | POA: Diagnosis not present

## 2015-11-18 DIAGNOSIS — K219 Gastro-esophageal reflux disease without esophagitis: Secondary | ICD-10-CM | POA: Diagnosis not present

## 2015-11-25 DIAGNOSIS — I5032 Chronic diastolic (congestive) heart failure: Secondary | ICD-10-CM | POA: Diagnosis not present

## 2015-11-25 DIAGNOSIS — E785 Hyperlipidemia, unspecified: Secondary | ICD-10-CM | POA: Diagnosis not present

## 2015-11-25 DIAGNOSIS — K219 Gastro-esophageal reflux disease without esophagitis: Secondary | ICD-10-CM | POA: Diagnosis not present

## 2015-11-25 DIAGNOSIS — I2510 Atherosclerotic heart disease of native coronary artery without angina pectoris: Secondary | ICD-10-CM | POA: Diagnosis not present

## 2015-11-25 DIAGNOSIS — I129 Hypertensive chronic kidney disease with stage 1 through stage 4 chronic kidney disease, or unspecified chronic kidney disease: Secondary | ICD-10-CM | POA: Diagnosis not present

## 2015-11-25 DIAGNOSIS — Z8673 Personal history of transient ischemic attack (TIA), and cerebral infarction without residual deficits: Secondary | ICD-10-CM | POA: Diagnosis not present

## 2015-11-25 DIAGNOSIS — N183 Chronic kidney disease, stage 3 (moderate): Secondary | ICD-10-CM | POA: Diagnosis not present

## 2015-12-02 DIAGNOSIS — I5032 Chronic diastolic (congestive) heart failure: Secondary | ICD-10-CM | POA: Diagnosis not present

## 2015-12-02 DIAGNOSIS — Z8673 Personal history of transient ischemic attack (TIA), and cerebral infarction without residual deficits: Secondary | ICD-10-CM | POA: Diagnosis not present

## 2015-12-02 DIAGNOSIS — I129 Hypertensive chronic kidney disease with stage 1 through stage 4 chronic kidney disease, or unspecified chronic kidney disease: Secondary | ICD-10-CM | POA: Diagnosis not present

## 2015-12-02 DIAGNOSIS — K219 Gastro-esophageal reflux disease without esophagitis: Secondary | ICD-10-CM | POA: Diagnosis not present

## 2015-12-02 DIAGNOSIS — N183 Chronic kidney disease, stage 3 (moderate): Secondary | ICD-10-CM | POA: Diagnosis not present

## 2015-12-02 DIAGNOSIS — I2510 Atherosclerotic heart disease of native coronary artery without angina pectoris: Secondary | ICD-10-CM | POA: Diagnosis not present

## 2015-12-02 DIAGNOSIS — E785 Hyperlipidemia, unspecified: Secondary | ICD-10-CM | POA: Diagnosis not present

## 2015-12-03 DIAGNOSIS — I5032 Chronic diastolic (congestive) heart failure: Secondary | ICD-10-CM | POA: Diagnosis not present

## 2015-12-03 DIAGNOSIS — I129 Hypertensive chronic kidney disease with stage 1 through stage 4 chronic kidney disease, or unspecified chronic kidney disease: Secondary | ICD-10-CM | POA: Diagnosis not present

## 2015-12-03 DIAGNOSIS — N183 Chronic kidney disease, stage 3 (moderate): Secondary | ICD-10-CM | POA: Diagnosis not present

## 2015-12-03 DIAGNOSIS — I2510 Atherosclerotic heart disease of native coronary artery without angina pectoris: Secondary | ICD-10-CM | POA: Diagnosis not present

## 2015-12-06 DIAGNOSIS — I2510 Atherosclerotic heart disease of native coronary artery without angina pectoris: Secondary | ICD-10-CM | POA: Diagnosis not present

## 2015-12-06 DIAGNOSIS — E785 Hyperlipidemia, unspecified: Secondary | ICD-10-CM | POA: Diagnosis not present

## 2015-12-06 DIAGNOSIS — K219 Gastro-esophageal reflux disease without esophagitis: Secondary | ICD-10-CM | POA: Diagnosis not present

## 2015-12-06 DIAGNOSIS — I5032 Chronic diastolic (congestive) heart failure: Secondary | ICD-10-CM | POA: Diagnosis not present

## 2015-12-06 DIAGNOSIS — I129 Hypertensive chronic kidney disease with stage 1 through stage 4 chronic kidney disease, or unspecified chronic kidney disease: Secondary | ICD-10-CM | POA: Diagnosis not present

## 2015-12-06 DIAGNOSIS — N183 Chronic kidney disease, stage 3 (moderate): Secondary | ICD-10-CM | POA: Diagnosis not present

## 2015-12-06 DIAGNOSIS — Z8673 Personal history of transient ischemic attack (TIA), and cerebral infarction without residual deficits: Secondary | ICD-10-CM | POA: Diagnosis not present

## 2015-12-09 DIAGNOSIS — N183 Chronic kidney disease, stage 3 (moderate): Secondary | ICD-10-CM | POA: Diagnosis not present

## 2015-12-09 DIAGNOSIS — I5032 Chronic diastolic (congestive) heart failure: Secondary | ICD-10-CM | POA: Diagnosis not present

## 2015-12-09 DIAGNOSIS — Z8673 Personal history of transient ischemic attack (TIA), and cerebral infarction without residual deficits: Secondary | ICD-10-CM | POA: Diagnosis not present

## 2015-12-09 DIAGNOSIS — I129 Hypertensive chronic kidney disease with stage 1 through stage 4 chronic kidney disease, or unspecified chronic kidney disease: Secondary | ICD-10-CM | POA: Diagnosis not present

## 2015-12-09 DIAGNOSIS — K219 Gastro-esophageal reflux disease without esophagitis: Secondary | ICD-10-CM | POA: Diagnosis not present

## 2015-12-09 DIAGNOSIS — E785 Hyperlipidemia, unspecified: Secondary | ICD-10-CM | POA: Diagnosis not present

## 2015-12-09 DIAGNOSIS — I2510 Atherosclerotic heart disease of native coronary artery without angina pectoris: Secondary | ICD-10-CM | POA: Diagnosis not present

## 2015-12-16 DIAGNOSIS — I2510 Atherosclerotic heart disease of native coronary artery without angina pectoris: Secondary | ICD-10-CM | POA: Diagnosis not present

## 2015-12-16 DIAGNOSIS — F1010 Alcohol abuse, uncomplicated: Secondary | ICD-10-CM | POA: Diagnosis not present

## 2015-12-16 DIAGNOSIS — N183 Chronic kidney disease, stage 3 (moderate): Secondary | ICD-10-CM | POA: Diagnosis not present

## 2015-12-16 DIAGNOSIS — E785 Hyperlipidemia, unspecified: Secondary | ICD-10-CM | POA: Diagnosis not present

## 2015-12-16 DIAGNOSIS — I5032 Chronic diastolic (congestive) heart failure: Secondary | ICD-10-CM | POA: Diagnosis not present

## 2015-12-16 DIAGNOSIS — Z8673 Personal history of transient ischemic attack (TIA), and cerebral infarction without residual deficits: Secondary | ICD-10-CM | POA: Diagnosis not present

## 2015-12-16 DIAGNOSIS — I129 Hypertensive chronic kidney disease with stage 1 through stage 4 chronic kidney disease, or unspecified chronic kidney disease: Secondary | ICD-10-CM | POA: Diagnosis not present

## 2015-12-16 DIAGNOSIS — K219 Gastro-esophageal reflux disease without esophagitis: Secondary | ICD-10-CM | POA: Diagnosis not present

## 2015-12-17 ENCOUNTER — Other Ambulatory Visit: Payer: Self-pay | Admitting: Cardiology

## 2015-12-25 DIAGNOSIS — N183 Chronic kidney disease, stage 3 (moderate): Secondary | ICD-10-CM | POA: Diagnosis not present

## 2015-12-25 DIAGNOSIS — E785 Hyperlipidemia, unspecified: Secondary | ICD-10-CM | POA: Diagnosis not present

## 2015-12-25 DIAGNOSIS — F1010 Alcohol abuse, uncomplicated: Secondary | ICD-10-CM | POA: Diagnosis not present

## 2015-12-25 DIAGNOSIS — I2510 Atherosclerotic heart disease of native coronary artery without angina pectoris: Secondary | ICD-10-CM | POA: Diagnosis not present

## 2015-12-25 DIAGNOSIS — Z8673 Personal history of transient ischemic attack (TIA), and cerebral infarction without residual deficits: Secondary | ICD-10-CM | POA: Diagnosis not present

## 2015-12-25 DIAGNOSIS — K219 Gastro-esophageal reflux disease without esophagitis: Secondary | ICD-10-CM | POA: Diagnosis not present

## 2015-12-25 DIAGNOSIS — I5032 Chronic diastolic (congestive) heart failure: Secondary | ICD-10-CM | POA: Diagnosis not present

## 2015-12-25 DIAGNOSIS — I129 Hypertensive chronic kidney disease with stage 1 through stage 4 chronic kidney disease, or unspecified chronic kidney disease: Secondary | ICD-10-CM | POA: Diagnosis not present

## 2015-12-30 ENCOUNTER — Other Ambulatory Visit: Payer: Self-pay | Admitting: Cardiology

## 2015-12-30 DIAGNOSIS — Z8673 Personal history of transient ischemic attack (TIA), and cerebral infarction without residual deficits: Secondary | ICD-10-CM | POA: Diagnosis not present

## 2015-12-30 DIAGNOSIS — I2510 Atherosclerotic heart disease of native coronary artery without angina pectoris: Secondary | ICD-10-CM | POA: Diagnosis not present

## 2015-12-30 DIAGNOSIS — I129 Hypertensive chronic kidney disease with stage 1 through stage 4 chronic kidney disease, or unspecified chronic kidney disease: Secondary | ICD-10-CM | POA: Diagnosis not present

## 2015-12-30 DIAGNOSIS — N183 Chronic kidney disease, stage 3 (moderate): Secondary | ICD-10-CM | POA: Diagnosis not present

## 2015-12-30 DIAGNOSIS — K219 Gastro-esophageal reflux disease without esophagitis: Secondary | ICD-10-CM | POA: Diagnosis not present

## 2015-12-30 DIAGNOSIS — E785 Hyperlipidemia, unspecified: Secondary | ICD-10-CM | POA: Diagnosis not present

## 2015-12-30 DIAGNOSIS — I5032 Chronic diastolic (congestive) heart failure: Secondary | ICD-10-CM | POA: Diagnosis not present

## 2015-12-30 DIAGNOSIS — F1010 Alcohol abuse, uncomplicated: Secondary | ICD-10-CM | POA: Diagnosis not present

## 2016-01-06 DIAGNOSIS — Z8673 Personal history of transient ischemic attack (TIA), and cerebral infarction without residual deficits: Secondary | ICD-10-CM | POA: Diagnosis not present

## 2016-01-06 DIAGNOSIS — F1010 Alcohol abuse, uncomplicated: Secondary | ICD-10-CM | POA: Diagnosis not present

## 2016-01-06 DIAGNOSIS — I2510 Atherosclerotic heart disease of native coronary artery without angina pectoris: Secondary | ICD-10-CM | POA: Diagnosis not present

## 2016-01-06 DIAGNOSIS — K219 Gastro-esophageal reflux disease without esophagitis: Secondary | ICD-10-CM | POA: Diagnosis not present

## 2016-01-06 DIAGNOSIS — I129 Hypertensive chronic kidney disease with stage 1 through stage 4 chronic kidney disease, or unspecified chronic kidney disease: Secondary | ICD-10-CM | POA: Diagnosis not present

## 2016-01-06 DIAGNOSIS — E785 Hyperlipidemia, unspecified: Secondary | ICD-10-CM | POA: Diagnosis not present

## 2016-01-06 DIAGNOSIS — N183 Chronic kidney disease, stage 3 (moderate): Secondary | ICD-10-CM | POA: Diagnosis not present

## 2016-01-06 DIAGNOSIS — I5032 Chronic diastolic (congestive) heart failure: Secondary | ICD-10-CM | POA: Diagnosis not present

## 2016-01-13 DIAGNOSIS — N183 Chronic kidney disease, stage 3 (moderate): Secondary | ICD-10-CM | POA: Diagnosis not present

## 2016-01-13 DIAGNOSIS — Z8673 Personal history of transient ischemic attack (TIA), and cerebral infarction without residual deficits: Secondary | ICD-10-CM | POA: Diagnosis not present

## 2016-01-13 DIAGNOSIS — E785 Hyperlipidemia, unspecified: Secondary | ICD-10-CM | POA: Diagnosis not present

## 2016-01-13 DIAGNOSIS — I2510 Atherosclerotic heart disease of native coronary artery without angina pectoris: Secondary | ICD-10-CM | POA: Diagnosis not present

## 2016-01-13 DIAGNOSIS — K219 Gastro-esophageal reflux disease without esophagitis: Secondary | ICD-10-CM | POA: Diagnosis not present

## 2016-01-13 DIAGNOSIS — I5032 Chronic diastolic (congestive) heart failure: Secondary | ICD-10-CM | POA: Diagnosis not present

## 2016-01-13 DIAGNOSIS — I129 Hypertensive chronic kidney disease with stage 1 through stage 4 chronic kidney disease, or unspecified chronic kidney disease: Secondary | ICD-10-CM | POA: Diagnosis not present

## 2016-01-13 DIAGNOSIS — F1010 Alcohol abuse, uncomplicated: Secondary | ICD-10-CM | POA: Diagnosis not present

## 2016-01-21 DIAGNOSIS — I2510 Atherosclerotic heart disease of native coronary artery without angina pectoris: Secondary | ICD-10-CM | POA: Diagnosis not present

## 2016-01-21 DIAGNOSIS — F1010 Alcohol abuse, uncomplicated: Secondary | ICD-10-CM | POA: Diagnosis not present

## 2016-01-21 DIAGNOSIS — E785 Hyperlipidemia, unspecified: Secondary | ICD-10-CM | POA: Diagnosis not present

## 2016-01-21 DIAGNOSIS — I129 Hypertensive chronic kidney disease with stage 1 through stage 4 chronic kidney disease, or unspecified chronic kidney disease: Secondary | ICD-10-CM | POA: Diagnosis not present

## 2016-01-21 DIAGNOSIS — I5032 Chronic diastolic (congestive) heart failure: Secondary | ICD-10-CM | POA: Diagnosis not present

## 2016-01-21 DIAGNOSIS — N183 Chronic kidney disease, stage 3 (moderate): Secondary | ICD-10-CM | POA: Diagnosis not present

## 2016-01-21 DIAGNOSIS — Z8673 Personal history of transient ischemic attack (TIA), and cerebral infarction without residual deficits: Secondary | ICD-10-CM | POA: Diagnosis not present

## 2016-01-21 DIAGNOSIS — K219 Gastro-esophageal reflux disease without esophagitis: Secondary | ICD-10-CM | POA: Diagnosis not present

## 2016-01-27 DIAGNOSIS — Z8673 Personal history of transient ischemic attack (TIA), and cerebral infarction without residual deficits: Secondary | ICD-10-CM | POA: Diagnosis not present

## 2016-01-27 DIAGNOSIS — I5032 Chronic diastolic (congestive) heart failure: Secondary | ICD-10-CM | POA: Diagnosis not present

## 2016-01-27 DIAGNOSIS — I2510 Atherosclerotic heart disease of native coronary artery without angina pectoris: Secondary | ICD-10-CM | POA: Diagnosis not present

## 2016-01-27 DIAGNOSIS — F1010 Alcohol abuse, uncomplicated: Secondary | ICD-10-CM | POA: Diagnosis not present

## 2016-01-27 DIAGNOSIS — E785 Hyperlipidemia, unspecified: Secondary | ICD-10-CM | POA: Diagnosis not present

## 2016-01-27 DIAGNOSIS — K219 Gastro-esophageal reflux disease without esophagitis: Secondary | ICD-10-CM | POA: Diagnosis not present

## 2016-01-27 DIAGNOSIS — N183 Chronic kidney disease, stage 3 (moderate): Secondary | ICD-10-CM | POA: Diagnosis not present

## 2016-01-27 DIAGNOSIS — I129 Hypertensive chronic kidney disease with stage 1 through stage 4 chronic kidney disease, or unspecified chronic kidney disease: Secondary | ICD-10-CM | POA: Diagnosis not present

## 2016-02-01 DIAGNOSIS — I2510 Atherosclerotic heart disease of native coronary artery without angina pectoris: Secondary | ICD-10-CM | POA: Diagnosis not present

## 2016-02-01 DIAGNOSIS — I129 Hypertensive chronic kidney disease with stage 1 through stage 4 chronic kidney disease, or unspecified chronic kidney disease: Secondary | ICD-10-CM | POA: Diagnosis not present

## 2016-02-01 DIAGNOSIS — I5032 Chronic diastolic (congestive) heart failure: Secondary | ICD-10-CM | POA: Diagnosis not present

## 2016-02-01 DIAGNOSIS — N183 Chronic kidney disease, stage 3 (moderate): Secondary | ICD-10-CM | POA: Diagnosis not present

## 2016-02-03 DIAGNOSIS — I5032 Chronic diastolic (congestive) heart failure: Secondary | ICD-10-CM | POA: Diagnosis not present

## 2016-02-03 DIAGNOSIS — I2510 Atherosclerotic heart disease of native coronary artery without angina pectoris: Secondary | ICD-10-CM | POA: Diagnosis not present

## 2016-02-03 DIAGNOSIS — I129 Hypertensive chronic kidney disease with stage 1 through stage 4 chronic kidney disease, or unspecified chronic kidney disease: Secondary | ICD-10-CM | POA: Diagnosis not present

## 2016-02-03 DIAGNOSIS — N183 Chronic kidney disease, stage 3 (moderate): Secondary | ICD-10-CM | POA: Diagnosis not present

## 2016-02-03 DIAGNOSIS — K219 Gastro-esophageal reflux disease without esophagitis: Secondary | ICD-10-CM | POA: Diagnosis not present

## 2016-02-03 DIAGNOSIS — E785 Hyperlipidemia, unspecified: Secondary | ICD-10-CM | POA: Diagnosis not present

## 2016-02-03 DIAGNOSIS — Z8673 Personal history of transient ischemic attack (TIA), and cerebral infarction without residual deficits: Secondary | ICD-10-CM | POA: Diagnosis not present

## 2016-02-03 DIAGNOSIS — F1010 Alcohol abuse, uncomplicated: Secondary | ICD-10-CM | POA: Diagnosis not present

## 2016-02-10 DIAGNOSIS — E785 Hyperlipidemia, unspecified: Secondary | ICD-10-CM | POA: Diagnosis not present

## 2016-02-10 DIAGNOSIS — I129 Hypertensive chronic kidney disease with stage 1 through stage 4 chronic kidney disease, or unspecified chronic kidney disease: Secondary | ICD-10-CM | POA: Diagnosis not present

## 2016-02-10 DIAGNOSIS — F1010 Alcohol abuse, uncomplicated: Secondary | ICD-10-CM | POA: Diagnosis not present

## 2016-02-10 DIAGNOSIS — I2510 Atherosclerotic heart disease of native coronary artery without angina pectoris: Secondary | ICD-10-CM | POA: Diagnosis not present

## 2016-02-10 DIAGNOSIS — N183 Chronic kidney disease, stage 3 (moderate): Secondary | ICD-10-CM | POA: Diagnosis not present

## 2016-02-10 DIAGNOSIS — I5032 Chronic diastolic (congestive) heart failure: Secondary | ICD-10-CM | POA: Diagnosis not present

## 2016-02-10 DIAGNOSIS — K219 Gastro-esophageal reflux disease without esophagitis: Secondary | ICD-10-CM | POA: Diagnosis not present

## 2016-02-10 DIAGNOSIS — Z8673 Personal history of transient ischemic attack (TIA), and cerebral infarction without residual deficits: Secondary | ICD-10-CM | POA: Diagnosis not present

## 2016-02-12 DIAGNOSIS — R55 Syncope and collapse: Secondary | ICD-10-CM | POA: Diagnosis not present

## 2016-02-12 DIAGNOSIS — E782 Mixed hyperlipidemia: Secondary | ICD-10-CM | POA: Diagnosis not present

## 2016-02-12 DIAGNOSIS — Z79899 Other long term (current) drug therapy: Secondary | ICD-10-CM | POA: Diagnosis not present

## 2016-02-12 DIAGNOSIS — R296 Repeated falls: Secondary | ICD-10-CM | POA: Diagnosis not present

## 2016-02-13 DIAGNOSIS — K219 Gastro-esophageal reflux disease without esophagitis: Secondary | ICD-10-CM | POA: Diagnosis not present

## 2016-02-13 DIAGNOSIS — E785 Hyperlipidemia, unspecified: Secondary | ICD-10-CM | POA: Diagnosis not present

## 2016-02-13 DIAGNOSIS — I2510 Atherosclerotic heart disease of native coronary artery without angina pectoris: Secondary | ICD-10-CM | POA: Diagnosis not present

## 2016-02-13 DIAGNOSIS — N183 Chronic kidney disease, stage 3 (moderate): Secondary | ICD-10-CM | POA: Diagnosis not present

## 2016-02-13 DIAGNOSIS — I5032 Chronic diastolic (congestive) heart failure: Secondary | ICD-10-CM | POA: Diagnosis not present

## 2016-02-13 DIAGNOSIS — I129 Hypertensive chronic kidney disease with stage 1 through stage 4 chronic kidney disease, or unspecified chronic kidney disease: Secondary | ICD-10-CM | POA: Diagnosis not present

## 2016-02-13 DIAGNOSIS — Z8673 Personal history of transient ischemic attack (TIA), and cerebral infarction without residual deficits: Secondary | ICD-10-CM | POA: Diagnosis not present

## 2016-02-17 DIAGNOSIS — I5032 Chronic diastolic (congestive) heart failure: Secondary | ICD-10-CM | POA: Diagnosis not present

## 2016-02-17 DIAGNOSIS — K219 Gastro-esophageal reflux disease without esophagitis: Secondary | ICD-10-CM | POA: Diagnosis not present

## 2016-02-17 DIAGNOSIS — Z8673 Personal history of transient ischemic attack (TIA), and cerebral infarction without residual deficits: Secondary | ICD-10-CM | POA: Diagnosis not present

## 2016-02-17 DIAGNOSIS — I2510 Atherosclerotic heart disease of native coronary artery without angina pectoris: Secondary | ICD-10-CM | POA: Diagnosis not present

## 2016-02-17 DIAGNOSIS — N183 Chronic kidney disease, stage 3 (moderate): Secondary | ICD-10-CM | POA: Diagnosis not present

## 2016-02-17 DIAGNOSIS — E785 Hyperlipidemia, unspecified: Secondary | ICD-10-CM | POA: Diagnosis not present

## 2016-02-17 DIAGNOSIS — I129 Hypertensive chronic kidney disease with stage 1 through stage 4 chronic kidney disease, or unspecified chronic kidney disease: Secondary | ICD-10-CM | POA: Diagnosis not present

## 2016-02-24 DIAGNOSIS — E785 Hyperlipidemia, unspecified: Secondary | ICD-10-CM | POA: Diagnosis not present

## 2016-02-24 DIAGNOSIS — N183 Chronic kidney disease, stage 3 (moderate): Secondary | ICD-10-CM | POA: Diagnosis not present

## 2016-02-24 DIAGNOSIS — Z8673 Personal history of transient ischemic attack (TIA), and cerebral infarction without residual deficits: Secondary | ICD-10-CM | POA: Diagnosis not present

## 2016-02-24 DIAGNOSIS — I5032 Chronic diastolic (congestive) heart failure: Secondary | ICD-10-CM | POA: Diagnosis not present

## 2016-02-24 DIAGNOSIS — K219 Gastro-esophageal reflux disease without esophagitis: Secondary | ICD-10-CM | POA: Diagnosis not present

## 2016-02-24 DIAGNOSIS — I2510 Atherosclerotic heart disease of native coronary artery without angina pectoris: Secondary | ICD-10-CM | POA: Diagnosis not present

## 2016-02-24 DIAGNOSIS — I129 Hypertensive chronic kidney disease with stage 1 through stage 4 chronic kidney disease, or unspecified chronic kidney disease: Secondary | ICD-10-CM | POA: Diagnosis not present

## 2016-03-02 DIAGNOSIS — Z8673 Personal history of transient ischemic attack (TIA), and cerebral infarction without residual deficits: Secondary | ICD-10-CM | POA: Diagnosis not present

## 2016-03-02 DIAGNOSIS — N183 Chronic kidney disease, stage 3 (moderate): Secondary | ICD-10-CM | POA: Diagnosis not present

## 2016-03-02 DIAGNOSIS — K219 Gastro-esophageal reflux disease without esophagitis: Secondary | ICD-10-CM | POA: Diagnosis not present

## 2016-03-02 DIAGNOSIS — I5032 Chronic diastolic (congestive) heart failure: Secondary | ICD-10-CM | POA: Diagnosis not present

## 2016-03-02 DIAGNOSIS — I2510 Atherosclerotic heart disease of native coronary artery without angina pectoris: Secondary | ICD-10-CM | POA: Diagnosis not present

## 2016-03-02 DIAGNOSIS — E785 Hyperlipidemia, unspecified: Secondary | ICD-10-CM | POA: Diagnosis not present

## 2016-03-02 DIAGNOSIS — I129 Hypertensive chronic kidney disease with stage 1 through stage 4 chronic kidney disease, or unspecified chronic kidney disease: Secondary | ICD-10-CM | POA: Diagnosis not present

## 2016-03-10 DIAGNOSIS — K219 Gastro-esophageal reflux disease without esophagitis: Secondary | ICD-10-CM | POA: Diagnosis not present

## 2016-03-10 DIAGNOSIS — N183 Chronic kidney disease, stage 3 (moderate): Secondary | ICD-10-CM | POA: Diagnosis not present

## 2016-03-10 DIAGNOSIS — I2510 Atherosclerotic heart disease of native coronary artery without angina pectoris: Secondary | ICD-10-CM | POA: Diagnosis not present

## 2016-03-10 DIAGNOSIS — I129 Hypertensive chronic kidney disease with stage 1 through stage 4 chronic kidney disease, or unspecified chronic kidney disease: Secondary | ICD-10-CM | POA: Diagnosis not present

## 2016-03-10 DIAGNOSIS — I5032 Chronic diastolic (congestive) heart failure: Secondary | ICD-10-CM | POA: Diagnosis not present

## 2016-03-10 DIAGNOSIS — Z8673 Personal history of transient ischemic attack (TIA), and cerebral infarction without residual deficits: Secondary | ICD-10-CM | POA: Diagnosis not present

## 2016-03-10 DIAGNOSIS — E785 Hyperlipidemia, unspecified: Secondary | ICD-10-CM | POA: Diagnosis not present

## 2016-03-12 DIAGNOSIS — H538 Other visual disturbances: Secondary | ICD-10-CM | POA: Diagnosis not present

## 2016-03-12 DIAGNOSIS — H5713 Ocular pain, bilateral: Secondary | ICD-10-CM | POA: Diagnosis not present

## 2016-03-12 DIAGNOSIS — H4320 Crystalline deposits in vitreous body, unspecified eye: Secondary | ICD-10-CM | POA: Diagnosis not present

## 2016-03-12 DIAGNOSIS — H401133 Primary open-angle glaucoma, bilateral, severe stage: Secondary | ICD-10-CM | POA: Diagnosis not present

## 2016-03-12 DIAGNOSIS — I129 Hypertensive chronic kidney disease with stage 1 through stage 4 chronic kidney disease, or unspecified chronic kidney disease: Secondary | ICD-10-CM | POA: Diagnosis not present

## 2016-03-12 DIAGNOSIS — H4312 Vitreous hemorrhage, left eye: Secondary | ICD-10-CM | POA: Diagnosis not present

## 2016-03-12 DIAGNOSIS — R296 Repeated falls: Secondary | ICD-10-CM | POA: Diagnosis not present

## 2016-03-16 DIAGNOSIS — E785 Hyperlipidemia, unspecified: Secondary | ICD-10-CM | POA: Diagnosis not present

## 2016-03-16 DIAGNOSIS — I129 Hypertensive chronic kidney disease with stage 1 through stage 4 chronic kidney disease, or unspecified chronic kidney disease: Secondary | ICD-10-CM | POA: Diagnosis not present

## 2016-03-16 DIAGNOSIS — I5032 Chronic diastolic (congestive) heart failure: Secondary | ICD-10-CM | POA: Diagnosis not present

## 2016-03-16 DIAGNOSIS — K219 Gastro-esophageal reflux disease without esophagitis: Secondary | ICD-10-CM | POA: Diagnosis not present

## 2016-03-16 DIAGNOSIS — F1010 Alcohol abuse, uncomplicated: Secondary | ICD-10-CM | POA: Diagnosis not present

## 2016-03-16 DIAGNOSIS — Z8673 Personal history of transient ischemic attack (TIA), and cerebral infarction without residual deficits: Secondary | ICD-10-CM | POA: Diagnosis not present

## 2016-03-16 DIAGNOSIS — I2510 Atherosclerotic heart disease of native coronary artery without angina pectoris: Secondary | ICD-10-CM | POA: Diagnosis not present

## 2016-03-16 DIAGNOSIS — N183 Chronic kidney disease, stage 3 (moderate): Secondary | ICD-10-CM | POA: Diagnosis not present

## 2016-03-23 DIAGNOSIS — K219 Gastro-esophageal reflux disease without esophagitis: Secondary | ICD-10-CM | POA: Diagnosis not present

## 2016-03-23 DIAGNOSIS — N183 Chronic kidney disease, stage 3 (moderate): Secondary | ICD-10-CM | POA: Diagnosis not present

## 2016-03-23 DIAGNOSIS — I2510 Atherosclerotic heart disease of native coronary artery without angina pectoris: Secondary | ICD-10-CM | POA: Diagnosis not present

## 2016-03-23 DIAGNOSIS — Z8673 Personal history of transient ischemic attack (TIA), and cerebral infarction without residual deficits: Secondary | ICD-10-CM | POA: Diagnosis not present

## 2016-03-23 DIAGNOSIS — I129 Hypertensive chronic kidney disease with stage 1 through stage 4 chronic kidney disease, or unspecified chronic kidney disease: Secondary | ICD-10-CM | POA: Diagnosis not present

## 2016-03-23 DIAGNOSIS — F1010 Alcohol abuse, uncomplicated: Secondary | ICD-10-CM | POA: Diagnosis not present

## 2016-03-23 DIAGNOSIS — I5032 Chronic diastolic (congestive) heart failure: Secondary | ICD-10-CM | POA: Diagnosis not present

## 2016-03-23 DIAGNOSIS — E785 Hyperlipidemia, unspecified: Secondary | ICD-10-CM | POA: Diagnosis not present

## 2016-03-30 DIAGNOSIS — Z8673 Personal history of transient ischemic attack (TIA), and cerebral infarction without residual deficits: Secondary | ICD-10-CM | POA: Diagnosis not present

## 2016-03-30 DIAGNOSIS — I5032 Chronic diastolic (congestive) heart failure: Secondary | ICD-10-CM | POA: Diagnosis not present

## 2016-03-30 DIAGNOSIS — F1010 Alcohol abuse, uncomplicated: Secondary | ICD-10-CM | POA: Diagnosis not present

## 2016-03-30 DIAGNOSIS — I2510 Atherosclerotic heart disease of native coronary artery without angina pectoris: Secondary | ICD-10-CM | POA: Diagnosis not present

## 2016-03-30 DIAGNOSIS — E785 Hyperlipidemia, unspecified: Secondary | ICD-10-CM | POA: Diagnosis not present

## 2016-03-30 DIAGNOSIS — I129 Hypertensive chronic kidney disease with stage 1 through stage 4 chronic kidney disease, or unspecified chronic kidney disease: Secondary | ICD-10-CM | POA: Diagnosis not present

## 2016-03-30 DIAGNOSIS — K219 Gastro-esophageal reflux disease without esophagitis: Secondary | ICD-10-CM | POA: Diagnosis not present

## 2016-03-30 DIAGNOSIS — N183 Chronic kidney disease, stage 3 (moderate): Secondary | ICD-10-CM | POA: Diagnosis not present

## 2016-04-01 DIAGNOSIS — I2510 Atherosclerotic heart disease of native coronary artery without angina pectoris: Secondary | ICD-10-CM | POA: Diagnosis not present

## 2016-04-01 DIAGNOSIS — N183 Chronic kidney disease, stage 3 (moderate): Secondary | ICD-10-CM | POA: Diagnosis not present

## 2016-04-01 DIAGNOSIS — I5032 Chronic diastolic (congestive) heart failure: Secondary | ICD-10-CM | POA: Diagnosis not present

## 2016-04-01 DIAGNOSIS — I129 Hypertensive chronic kidney disease with stage 1 through stage 4 chronic kidney disease, or unspecified chronic kidney disease: Secondary | ICD-10-CM | POA: Diagnosis not present

## 2016-04-03 ENCOUNTER — Encounter (INDEPENDENT_AMBULATORY_CARE_PROVIDER_SITE_OTHER): Payer: Medicare Other | Admitting: Ophthalmology

## 2016-04-03 DIAGNOSIS — I10 Essential (primary) hypertension: Secondary | ICD-10-CM | POA: Diagnosis not present

## 2016-04-03 DIAGNOSIS — H2512 Age-related nuclear cataract, left eye: Secondary | ICD-10-CM | POA: Diagnosis not present

## 2016-04-03 DIAGNOSIS — H4312 Vitreous hemorrhage, left eye: Secondary | ICD-10-CM

## 2016-04-03 DIAGNOSIS — H348122 Central retinal vein occlusion, left eye, stable: Secondary | ICD-10-CM | POA: Diagnosis not present

## 2016-04-03 DIAGNOSIS — H35033 Hypertensive retinopathy, bilateral: Secondary | ICD-10-CM

## 2016-04-03 DIAGNOSIS — H43813 Vitreous degeneration, bilateral: Secondary | ICD-10-CM

## 2016-04-03 NOTE — H&P (Signed)
Trevor Murphy is an 81 y.o. male.   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 Arthritis   . Blindness of left eye   . CAD (coronary artery disease)    a. Cath 08/2009: minimal nonobstructive disease (25% LAD). b. Lexiscan nuc 6/215: EF 54%, no ischemia or infarction.  . Chronic diastolic CHF (congestive heart failure)    a. Echo 2013: EF 55-60%, mild AI, mild MR. b. Echo 5/15 with EF 60-65%, mild LVH, mild AI.  [name] Kitchen CKD (chronic kidney disease) stage 3, GFR 30-59 ml/min 07/26/2012   a. Felt likely due to HTN.  . CVA (cerebral infarction) 07/28/2012  . Glaucoma   . Gout   . H/O umbilical hernia repair   . Headache(784.0)    " WHEN MY BLOOD PRESSURE IS UP"  . History of acute renal failure   . History of alcohol abuse   . HTN (hypertension)   . Hypertension    a. Patent renal arteries 2005.  [name] Kitchen Noncompliance   . OA (osteoarthritis)   . Other and unspecified hyperlipidemia   . Poor circulation   . SVT (supraventricular tachycardia)    a. Possible AVNRT.  Got adenosine from EMS in 2015 for SVT.  During 5/15 hospitalization had episode of short R-P tachycardia (possible AVNRT) terminated by adenosine.     Past Surgical History:  Procedure Laterality Date  . APPENDECTOMY    . CARDIAC CATHETERIZATION  11/2003   EF 50-55%  . CARDIAC CATHETERIZATION  08/2009  . CATARACT EXTRACTION EXTRACAPSULAR Right 05/17/2013   Procedure: CATARACT EXTRACTION EXTRACAPSULAR WITH INTRAOCULAR LENS PLACEMENT (IOC) RIGHT EYE;  Surgeon: Marylynn Pearson, MD;  Location: [facility];  Service: Ophthalmology;  Laterality: Right;  . HERNIA REPAIR    . VENTRAL HERNIA REPAIR  03/07/2012   Procedure: LAPAROSCOPIC VENTRAL HERNIA;  Surgeon: Gwenyth Ober, MD;  Location: [facility];  Service: General;  Laterality: N/A;    Family History  Problem Relation Age of Onset  . CVA Mother   . Hypertension Sister   . Hypertension  Brother   . Hypertension Brother   . Heart attack Neg Hx   . Stroke Sister    Social History:  reports that he has never smoked. He has never used smokeless tobacco. He reports that he drinks about 0.6 oz of alcohol per week . He reports that he does not use drugs.  Allergies: No Known Allergies  No prescriptions prior to admission.    Review of systems otherwise negative  There were no vitals taken for this visit.  Physical exam: Mental status: oriented x3. Eyes: See eye exam associated with this date of surgery in media tab.  Scanned in by scanning center Ears, Nose, Throat: within normal limits Neck: Within Normal limits General: within normal limits Chest: Within normal limits Breast: deferred Heart: Within normal limits Abdomen: Within normal limits GU: deferred Extremities: within normal limits Skin: within normal limits  Assessment/Plan Vitreous hemorrhage Left eye Plan: To [facility] for [facility] plana vitrectomy, laser, gas injection left eye  [name] 04/03/2016, 12:52 PM

## 2016-04-06 ENCOUNTER — Ambulatory Visit: Payer: Medicare Other | Attending: Nurse Practitioner | Admitting: Physical Therapy

## 2016-04-06 DIAGNOSIS — I5032 Chronic diastolic (congestive) heart failure: Secondary | ICD-10-CM | POA: Diagnosis not present

## 2016-04-06 DIAGNOSIS — F1010 Alcohol abuse, uncomplicated: Secondary | ICD-10-CM | POA: Diagnosis not present

## 2016-04-06 DIAGNOSIS — E785 Hyperlipidemia, unspecified: Secondary | ICD-10-CM | POA: Diagnosis not present

## 2016-04-06 DIAGNOSIS — Z8673 Personal history of transient ischemic attack (TIA), and cerebral infarction without residual deficits: Secondary | ICD-10-CM | POA: Diagnosis not present

## 2016-04-06 DIAGNOSIS — I129 Hypertensive chronic kidney disease with stage 1 through stage 4 chronic kidney disease, or unspecified chronic kidney disease: Secondary | ICD-10-CM | POA: Diagnosis not present

## 2016-04-06 DIAGNOSIS — K219 Gastro-esophageal reflux disease without esophagitis: Secondary | ICD-10-CM | POA: Diagnosis not present

## 2016-04-06 DIAGNOSIS — N183 Chronic kidney disease, stage 3 (moderate): Secondary | ICD-10-CM | POA: Diagnosis not present

## 2016-04-06 DIAGNOSIS — I2510 Atherosclerotic heart disease of native coronary artery without angina pectoris: Secondary | ICD-10-CM | POA: Diagnosis not present

## 2016-04-07 DIAGNOSIS — N183 Chronic kidney disease, stage 3 (moderate): Secondary | ICD-10-CM | POA: Diagnosis not present

## 2016-04-07 DIAGNOSIS — I2510 Atherosclerotic heart disease of native coronary artery without angina pectoris: Secondary | ICD-10-CM | POA: Diagnosis not present

## 2016-04-07 DIAGNOSIS — I5032 Chronic diastolic (congestive) heart failure: Secondary | ICD-10-CM | POA: Diagnosis not present

## 2016-04-07 DIAGNOSIS — E785 Hyperlipidemia, unspecified: Secondary | ICD-10-CM | POA: Diagnosis not present

## 2016-04-07 DIAGNOSIS — Z8673 Personal history of transient ischemic attack (TIA), and cerebral infarction without residual deficits: Secondary | ICD-10-CM | POA: Diagnosis not present

## 2016-04-07 DIAGNOSIS — K219 Gastro-esophageal reflux disease without esophagitis: Secondary | ICD-10-CM | POA: Diagnosis not present

## 2016-04-07 DIAGNOSIS — I129 Hypertensive chronic kidney disease with stage 1 through stage 4 chronic kidney disease, or unspecified chronic kidney disease: Secondary | ICD-10-CM | POA: Diagnosis not present

## 2016-04-08 ENCOUNTER — Encounter (INDEPENDENT_AMBULATORY_CARE_PROVIDER_SITE_OTHER): Payer: Self-pay | Admitting: Ophthalmology

## 2016-04-09 DIAGNOSIS — I129 Hypertensive chronic kidney disease with stage 1 through stage 4 chronic kidney disease, or unspecified chronic kidney disease: Secondary | ICD-10-CM | POA: Diagnosis not present

## 2016-04-09 DIAGNOSIS — I2510 Atherosclerotic heart disease of native coronary artery without angina pectoris: Secondary | ICD-10-CM | POA: Diagnosis not present

## 2016-04-09 DIAGNOSIS — K219 Gastro-esophageal reflux disease without esophagitis: Secondary | ICD-10-CM | POA: Diagnosis not present

## 2016-04-09 DIAGNOSIS — I5032 Chronic diastolic (congestive) heart failure: Secondary | ICD-10-CM | POA: Diagnosis not present

## 2016-04-09 DIAGNOSIS — E785 Hyperlipidemia, unspecified: Secondary | ICD-10-CM | POA: Diagnosis not present

## 2016-04-09 DIAGNOSIS — Z8673 Personal history of transient ischemic attack (TIA), and cerebral infarction without residual deficits: Secondary | ICD-10-CM | POA: Diagnosis not present

## 2016-04-09 DIAGNOSIS — F1010 Alcohol abuse, uncomplicated: Secondary | ICD-10-CM | POA: Diagnosis not present

## 2016-04-09 DIAGNOSIS — N183 Chronic kidney disease, stage 3 (moderate): Secondary | ICD-10-CM | POA: Diagnosis not present

## 2016-04-10 ENCOUNTER — Encounter (HOSPITAL_COMMUNITY): Payer: Self-pay | Admitting: Vascular Surgery

## 2016-04-10 ENCOUNTER — Encounter (HOSPITAL_COMMUNITY): Payer: Self-pay | Admitting: *Deleted

## 2016-04-10 NOTE — Progress Notes (Addendum)
Anesthesia Chart Review: SAME DAY WORK-UP.  Patient is a 81 year old male scheduled for pars plana vitrectomy, laser, gas injection left eye on 04/14/2016 by Dr. Zigmund Daniel.  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 Admitted for hypertensive urgency 02/04/15 (off BP meds). BP 190/94 with headaches and right eye blurred vision. Meds resumed. Head CT showed no acute abnormality. He reported a possible syncopal episode a few weeks ago ("I may have gotten up too quick."). He sustained a black eye. He was seen at his PCP office ([facility]). PAT phone RN states patient was not the best historian.      PCP is Dr. Glendale Chard. He has seen a nephrologist in the past, but none in several years.  Cardiologist is Dr. Loralie Champagne, last visit 01/31/15. Continue Coreg for SVT. If he had frequent recurrences would then consider ablation. Euvolemic on exam. Recommended close monitoring of BP with medication compliance.    Meds currently listed include allopurinol, amlodipine, aspirin 81 mg, Coreg, clonidine, Lasix, hydralazine, Imdur, nitroglycerin, Protonix, KCl, Crestor. He reports that Dr. Zigmund Daniel told his to hold "blood thinners" for 5 days.   Currently, last EKG available is > 80 year old (02/04/15). If no EKG done within the past year, then he will need one done on arrival.   01/10/14 Nuclear stress test: Overall Impression:  Normal stress nuclear study. LV Ejection Fraction: 54%.  LV Wall Motion:  NL LV Function; NL Wall Motion.  12/19/13 Echo: Study Conclusions - Left ventricle: The cavity size was normal. Wall thickness was increased in a pattern of mild LVH. Systolic function was normal. The estimated ejection  fraction was in the range of 60% to 65%. Wall motion was normal; there were no regional wall motion abnormalities. There was an increased relative contribution of atrial contraction to ventricular filling. - Aortic valve: Mild regurgitation. - Mitral valve: Calcified annulus. Mildly thickened leaflets  07/27/12 Carotid U/S: Summary: No significant extracranial carotid artery stenosis demonstrated. Vertebrals are patent with antegrade flow.  08/21/09 Cardiac cath (ruled in for subendocardial MI with mildly elevated troponin in the setting of hypertensive urgency; Dr. Sherren Mocha): Final assessment: 1. Minimal nonobstructive CAD, mostly involving the mid LAD (25%).  2. Systemic hypertension. Plan: Recommend medical therapy for CAD. Previous catheterization report was reviewed and the patient had widely patent renal arteries in 2005. He probably does not need further evaluation for renal artery stenosis .  He will need updated labs prior to surgery.   Discussed available information with anesthesiologist Dr. Conrad Hinsdale. Recommend review PCP records to review Dr. Baird Cancer findings/recommendations falling his syncope/fall (possible orthostatic hypotension by patient's description). If testing ordered but not done then may need to consider postponing; however, if nothing too concerning that would evaluate day of surgery. He had mild AR but no AS by 2015 echo. I received 03/12/16 from [facility]. He was seen by [name] FNP-BC. According to her HPI, patient got caught on a chair and fell. However, he did have dizziness. Has worsening vision and felt pressure was up in his eye. He was seeing ophthalmologist Dr. Venetia Maxon that same day to check left eye pressures--now scheduled for surgery with Dr. Zigmund Daniel. NP also recommended PT evaluation due to frequent falls. No additional testing ordered.  Further evaluation of patient and review of labs/EKG on the day of surgery by his  anesthesiologist. Definitive plan at that time.     [facility]/Anesthesiology Phone [phone] 04/10/2016 3:52 PM

## 2016-04-10 NOTE — Progress Notes (Signed)
Mr Tye Savoy denies chest pain or shortness of breath.  Patient reported that he "blacked out" a few weeks ago-"hit head, had a black eye."  Patient does not know what happened "just blacked out.', "I may have gotten up too quick."  Mr Toups reported that he was seen by PCP, Dr Benson Setting. I called office and he was seen August 3.  I requested notes from Dr Tye Savoy office (labs, x-rays ekg).  Mr Makowski reports that Dr Tye Savoy has him on a blood thinner and that Dr Zigmund Daniel told him to hold it for 5 days.  I called [employer] and there is not a blood thinner on his list.  I had Dr Tye Savoy office nurse check and she does not see a blood thinner.  I called Dr Zigmund Daniel office, which is closed until Tuesday. (patient reported that his medications are prepared for him by [facility] in trays).  Patient has a [place] Nurse that sees hem once a week to monitor vital signs and weight. I asked about seeing cardiologist and he thought he was there 6 months ago, there is no record of this.  Patient does not see a nephrologist at this time, he reported that he had some years ago.

## 2016-04-13 DIAGNOSIS — F1010 Alcohol abuse, uncomplicated: Secondary | ICD-10-CM | POA: Diagnosis not present

## 2016-04-13 DIAGNOSIS — Z8673 Personal history of transient ischemic attack (TIA), and cerebral infarction without residual deficits: Secondary | ICD-10-CM | POA: Diagnosis not present

## 2016-04-13 DIAGNOSIS — K219 Gastro-esophageal reflux disease without esophagitis: Secondary | ICD-10-CM | POA: Diagnosis not present

## 2016-04-13 DIAGNOSIS — I129 Hypertensive chronic kidney disease with stage 1 through stage 4 chronic kidney disease, or unspecified chronic kidney disease: Secondary | ICD-10-CM | POA: Diagnosis not present

## 2016-04-13 DIAGNOSIS — I5032 Chronic diastolic (congestive) heart failure: Secondary | ICD-10-CM | POA: Diagnosis not present

## 2016-04-13 DIAGNOSIS — E785 Hyperlipidemia, unspecified: Secondary | ICD-10-CM | POA: Diagnosis not present

## 2016-04-13 DIAGNOSIS — I2510 Atherosclerotic heart disease of native coronary artery without angina pectoris: Secondary | ICD-10-CM | POA: Diagnosis not present

## 2016-04-13 DIAGNOSIS — N183 Chronic kidney disease, stage 3 (moderate): Secondary | ICD-10-CM | POA: Diagnosis not present

## 2016-04-14 ENCOUNTER — Encounter (HOSPITAL_COMMUNITY): Payer: Self-pay | Admitting: *Deleted

## 2016-04-14 ENCOUNTER — Ambulatory Visit (HOSPITAL_COMMUNITY)
Admission: RE | Admit: 2016-04-14 | Discharge: 2016-04-14 | Disposition: A | Discharge status: Home / Self Care | Payer: Medicare Other | Source: Ambulatory Visit | Attending: Ophthalmology | Admitting: Ophthalmology

## 2016-04-14 ENCOUNTER — Encounter (HOSPITAL_COMMUNITY)
Admission: RE | Disposition: A | Discharge status: Home / Self Care | Payer: Self-pay | Source: Ambulatory Visit | Attending: Ophthalmology

## 2016-04-14 DIAGNOSIS — M109 Gout, unspecified: Secondary | ICD-10-CM | POA: Diagnosis not present

## 2016-04-14 DIAGNOSIS — Z7982 Long term (current) use of aspirin: Secondary | ICD-10-CM | POA: Insufficient documentation

## 2016-04-14 DIAGNOSIS — H5442 Blindness, left eye, normal vision right eye: Secondary | ICD-10-CM | POA: Diagnosis not present

## 2016-04-14 DIAGNOSIS — N183 Chronic kidney disease, stage 3 (moderate): Secondary | ICD-10-CM | POA: Diagnosis not present

## 2016-04-14 DIAGNOSIS — R000 Tachycardia, unspecified: Secondary | ICD-10-CM

## 2016-04-14 DIAGNOSIS — H4312 Vitreous hemorrhage, left eye: Secondary | ICD-10-CM | POA: Diagnosis not present

## 2016-04-14 DIAGNOSIS — Z5309 Procedure and treatment not carried out because of other contraindication: Secondary | ICD-10-CM | POA: Insufficient documentation

## 2016-04-14 DIAGNOSIS — I471 Supraventricular tachycardia: Secondary | ICD-10-CM | POA: Diagnosis not present

## 2016-04-14 DIAGNOSIS — Z79899 Other long term (current) drug therapy: Secondary | ICD-10-CM | POA: Diagnosis not present

## 2016-04-14 DIAGNOSIS — Z8673 Personal history of transient ischemic attack (TIA), and cerebral infarction without residual deficits: Secondary | ICD-10-CM | POA: Diagnosis not present

## 2016-04-14 DIAGNOSIS — Z9119 Patient's noncompliance with other medical treatment and regimen: Secondary | ICD-10-CM | POA: Diagnosis not present

## 2016-04-14 DIAGNOSIS — I2510 Atherosclerotic heart disease of native coronary artery without angina pectoris: Secondary | ICD-10-CM | POA: Insufficient documentation

## 2016-04-14 DIAGNOSIS — I130 Hypertensive heart and chronic kidney disease with heart failure and stage 1 through stage 4 chronic kidney disease, or unspecified chronic kidney disease: Secondary | ICD-10-CM | POA: Insufficient documentation

## 2016-04-14 DIAGNOSIS — I5032 Chronic diastolic (congestive) heart failure: Secondary | ICD-10-CM | POA: Insufficient documentation

## 2016-04-14 LAB — COMPREHENSIVE METABOLIC PANEL
ALT: 20 U/L (ref 17–63)
AST: 24 U/L (ref 15–41)
Albumin: 3.9 g/dL (ref 3.5–5.0)
Alkaline Phosphatase: 96 U/L (ref 38–126)
Anion gap: 11 (ref 5–15)
BUN: 17 mg/dL (ref 6–20)
CO2: 19 mmol/L — ABNORMAL LOW (ref 22–32)
Calcium: 9 mg/dL (ref 8.9–10.3)
Chloride: 109 mmol/L (ref 101–111)
Creatinine, Ser: 1.81 mg/dL — ABNORMAL HIGH (ref 0.61–1.24)
GFR calc Af Amer: 39 mL/min — ABNORMAL LOW (ref >60)
GFR calc non Af Amer: 34 mL/min — ABNORMAL LOW (ref >60)
Glucose, Bld: 137 mg/dL — ABNORMAL HIGH (ref 65–99)
Potassium: 4.1 mmol/L (ref 3.5–5.1)
Sodium: 139 mmol/L (ref 135–145)
Total Bilirubin: 1.1 mg/dL (ref 0.3–1.2)
Total Protein: 7.8 g/dL (ref 6.5–8.1)

## 2016-04-14 LAB — CBC
HCT: 52.2 % — ABNORMAL HIGH (ref 39.0–52.0)
Hemoglobin: 17.4 g/dL — ABNORMAL HIGH (ref 13.0–17.0)
MCH: 27.4 pg (ref 26.0–34.0)
MCHC: 33.3 g/dL (ref 30.0–36.0)
MCV: 82.1 fL (ref 78.0–100.0)
Platelets: 162 10*3/uL (ref 150–400)
RBC: 6.36 MIL/uL — ABNORMAL HIGH (ref 4.22–5.81)
RDW: 15.5 % (ref 11.5–15.5)
WBC: 7.1 10*3/uL (ref 4.0–10.5)

## 2016-04-14 SURGERY — PARS PLANA VITRECTOMY WITH 25 GAUGE
Anesthesia: General | Laterality: Left

## 2016-04-14 MED ORDER — DEXAMETHASONE SODIUM PHOSPHATE 10 MG/ML IJ SOLN
INTRAMUSCULAR | Status: AC
  Filled 2016-04-14: qty 1

## 2016-04-14 MED ORDER — HYALURONIDASE HUMAN 150 UNIT/ML IJ SOLN
INTRAMUSCULAR | Status: AC
  Filled 2016-04-14: qty 1

## 2016-04-14 MED ORDER — CARVEDILOL 12.5 MG PO TABS
12.5000 mg | ORAL_TABLET | Freq: Once | ORAL | Status: AC
  Administered 2016-04-14: 12.5 mg via ORAL
  Filled 2016-04-14 (×2): qty 1

## 2016-04-14 MED ORDER — BRIMONIDINE TARTRATE 0.15 % OP SOLN
1.0000 [drp] | Freq: Two times a day (BID) | OPHTHALMIC | Status: DC
  Administered 2016-04-14: 1 [drp] via OPHTHALMIC
  Filled 2016-04-14: qty 5

## 2016-04-14 MED ORDER — HYPROMELLOSE (GONIOSCOPIC) 2.5 % OP SOLN
OPHTHALMIC | Status: AC
  Filled 2016-04-14: qty 15

## 2016-04-14 MED ORDER — CEFAZOLIN SODIUM-DEXTROSE 2-4 GM/100ML-% IV SOLN
2.0000 g | INTRAVENOUS | Status: DC
  Filled 2016-04-14: qty 100

## 2016-04-14 MED ORDER — ADENOSINE 12 MG/4ML IV SOLN
12.0000 mg | Freq: Once | INTRAVENOUS | Status: AC
  Administered 2016-04-14: 12 mg via INTRAVENOUS
  Filled 2016-04-14: qty 4

## 2016-04-14 MED ORDER — BUPIVACAINE HCL (PF) 0.75 % IJ SOLN
INTRAMUSCULAR | Status: AC
  Filled 2016-04-14: qty 10

## 2016-04-14 MED ORDER — GATIFLOXACIN 0.5 % OP SOLN
1.0000 [drp] | OPHTHALMIC | Status: AC | PRN
  Administered 2016-04-14 (×3): 1 [drp] via OPHTHALMIC
  Filled 2016-04-14: qty 2.5

## 2016-04-14 MED ORDER — ACETAZOLAMIDE SODIUM 500 MG IJ SOLR
INTRAMUSCULAR | Status: AC
  Filled 2016-04-14: qty 500

## 2016-04-14 MED ORDER — SODIUM CHLORIDE 0.9 % IV SOLN: INTRAVENOUS | Status: DC

## 2016-04-14 MED ORDER — TROPICAMIDE 1 % OP SOLN
1.0000 [drp] | OPHTHALMIC | Status: AC | PRN
  Administered 2016-04-14 (×3): 1 [drp] via OPHTHALMIC
  Filled 2016-04-14: qty 3

## 2016-04-14 MED ORDER — STERILE WATER FOR INJECTION IJ SOLN
INTRAMUSCULAR | Status: AC
  Filled 2016-04-14: qty 20

## 2016-04-14 MED ORDER — SODIUM HYALURONATE 10 MG/ML IO SOLN
INTRAOCULAR | Status: AC
  Filled 2016-04-14: qty 0.85

## 2016-04-14 MED ORDER — PHENYLEPHRINE HCL 2.5 % OP SOLN
1.0000 [drp] | OPHTHALMIC | Status: AC | PRN
  Administered 2016-04-14 (×3): 1 [drp] via OPHTHALMIC
  Filled 2016-04-14: qty 2

## 2016-04-14 MED ORDER — LIDOCAINE HCL 2 % IJ SOLN
INTRAMUSCULAR | Status: AC
  Filled 2016-04-14: qty 20

## 2016-04-14 MED ORDER — ADENOSINE 6 MG/2ML IV SOLN
6.0000 mg | Freq: Once | INTRAVENOUS | Status: AC
  Administered 2016-04-14: 6 mg via INTRAVENOUS
  Filled 2016-04-14: qty 2

## 2016-04-14 MED ORDER — ATROPINE SULFATE 1 % OP SOLN
OPHTHALMIC | Status: AC
  Filled 2016-04-14: qty 5

## 2016-04-14 MED ORDER — METOPROLOL TARTRATE 5 MG/5ML IV SOLN
5.0000 mg | Freq: Once | INTRAVENOUS | Status: AC
  Administered 2016-04-14: 4 mg via INTRAVENOUS
  Filled 2016-04-14: qty 5

## 2016-04-14 MED ORDER — CYCLOPENTOLATE HCL 1 % OP SOLN
1.0000 [drp] | OPHTHALMIC | Status: AC | PRN
  Administered 2016-04-14 (×3): 1 [drp] via OPHTHALMIC
  Filled 2016-04-14: qty 2

## 2016-04-14 MED ORDER — BSS PLUS IO SOLN
INTRAOCULAR | Status: AC
  Filled 2016-04-14: qty 500

## 2016-04-14 MED ORDER — POLYMYXIN B SULFATE 500000 UNITS IJ SOLR
INTRAMUSCULAR | Status: AC
  Filled 2016-04-14: qty 1

## 2016-04-14 MED ORDER — EPINEPHRINE HCL 1 MG/ML IJ SOLN
INTRAMUSCULAR | Status: AC
  Filled 2016-04-14: qty 1

## 2016-04-14 MED ORDER — BUPIVACAINE-EPINEPHRINE (PF) 0.25% -1:200000 IJ SOLN
INTRAMUSCULAR | Status: AC
  Filled 2016-04-14: qty 30

## 2016-04-14 MED ORDER — METOPROLOL TARTRATE 5 MG/5ML IV SOLN
INTRAVENOUS | Status: AC
  Filled 2016-04-14: qty 5

## 2016-04-14 MED ORDER — BSS IO SOLN
INTRAOCULAR | Status: AC
  Filled 2016-04-14: qty 15

## 2016-04-14 MED ORDER — TRIAMCINOLONE ACETONIDE 40 MG/ML IJ SUSP
INTRAMUSCULAR | Status: AC
  Filled 2016-04-14: qty 5

## 2016-04-14 MED ORDER — SODIUM CHLORIDE 0.9 % IJ SOLN
INTRAMUSCULAR | Status: AC
  Filled 2016-04-14: qty 10

## 2016-04-14 MED ORDER — CEFTAZIDIME 1 G IJ SOLR
INTRAMUSCULAR | Status: AC
  Filled 2016-04-14: qty 1

## 2016-04-14 NOTE — Progress Notes (Signed)
Report given to Pam, RN.

## 2016-04-14 NOTE — Progress Notes (Signed)
Prior to giving patient carvedilol I checked patient HR and BP. Heart rated noted to be in 140's BP 143/99. Patient placed on monitor Notified Dr. Lamarr Lulas. Repeat EKG performed.

## 2016-04-14 NOTE — Progress Notes (Signed)
Per Dr. Lamarr Lulas do not give any additional metoprolol and take patient to ER

## 2016-04-14 NOTE — Progress Notes (Signed)
VS stable,  SR at 81. No complaints of chest pain or dyspnea.

## 2016-04-14 NOTE — Progress Notes (Signed)
Patient discharged via wheelchair to personal vehicle at [place] tower entrance.

## 2016-04-14 NOTE — H&P (Signed)
I examined the patient today and there is no change in the medical status 

## 2016-04-14 NOTE — Consult Note (Signed)
Cardiology Consult    Patient ID: Trevor Murphy MRN: [id number], DOB/AGE: 12/24/1934   Admit date: 04/14/2016 Date of Consult: 04/14/2016  Primary Physician: Maximino Greenland, MD Reason for Consult: Tachycardia Primary Cardiologist: Dr. Aundra Dubin Requesting Provider: Dr. Zigmund Daniel   History of Present Illness    Trevor Murphy is a 81 y.o. 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  With his initial testing this AM, his labs showed a WBC of 7.1, Hgb 17.4, and platelets 162. K+ 4.1 and creatinine 1.81 (has been 1.5 - 1.7 in the past year). Initial EKG showed NSR, HR 84, with 1st degree AV Block.   While in the pre-op area, he was noted to be tachycardiac into the 140's. Repeat EKG showed a wide-QRS tachycardia with HR of 146. He was given IV Lopressor 5mg  once which slowed his HR into the 120's and Cardiology was called for further evaluation.  He denies any chest discomfort, palpitations, or dyspnea with his HR currently in the 120's. Denies any symptoms over the past few weeks. He takes Coreg 12.5mg  BID but says he did not take it this morning due to being NPO, although family at the bedside report he was instructed to take it.   Past Medical History   Past Medical History:  Diagnosis Date  . Arthritis   . Blindness of left eye   . CAD (coronary artery disease)    a. Cath 08/2009: minimal nonobstructive disease (25% LAD). b. Lexiscan nuc 6/215: EF 54%, no ischemia or infarction.  . Chronic diastolic CHF (congestive heart failure) ([place])    a. Echo 2013: EF 55-60%, mild AI, mild MR. b. Echo 5/15 with EF 60-65%, no WMA, mild LVH, mild AI.  [name] Kitchen CKD (chronic kidney disease) stage 3, GFR 30-59 ml/min 07/26/2012   a. Felt likely due to HTN.  . CVA (cerebral  infarction) 07/28/2012  . Glaucoma   . Gout   . H/O umbilical hernia repair   . Headache(784.0)    " WHEN MY BLOOD PRESSURE IS UP"  . History of acute renal failure   . History of alcohol abuse   . HTN (hypertension)   . Hypertension    a. Patent renal arteries 2005.  [name] Kitchen Noncompliance   . OA (osteoarthritis)   . Other and unspecified hyperlipidemia   . Poor circulation   . SVT (supraventricular tachycardia) ([place])    a. Possible AVNRT.  Got adenosine from EMS in 2015 for SVT.  During 5/15 hospitalization had episode of short R-P tachycardia (possible AVNRT) terminated by adenosine.  b. 04/2016 - recurrent tachycardia (SVT vs. AVNRT), again terminated by Adenosine    Past Surgical History:  Procedure Laterality Date  . APPENDECTOMY    . CARDIAC CATHETERIZATION  11/2003   EF 50-55%  . CARDIAC CATHETERIZATION  08/2009  . CATARACT EXTRACTION EXTRACAPSULAR Right 05/17/2013   Procedure: CATARACT EXTRACTION EXTRACAPSULAR WITH INTRAOCULAR LENS PLACEMENT (IOC) RIGHT EYE;  Surgeon: Marylynn Pearson, MD;  Location: [place];  Service: Ophthalmology;  Laterality: Right;  . COLONOSCOPY    . HERNIA REPAIR    . VENTRAL HERNIA REPAIR  03/07/2012   Procedure: LAPAROSCOPIC VENTRAL HERNIA;  Surgeon: Gwenyth Ober, MD;  Location: [place];  Service: General;  Laterality: N/A;  Allergies  Allergies  Allergen Reactions  . No Known Allergies     Inpatient Medications    . brimonidine  1 drop Left Eye BID  .  ceFAZolin (ANCEF) IV  2 g Intravenous To SS-Surg  . metoprolol        Family History    Family History  Problem Relation Age of Onset  . CVA Mother   . Hypertension Brother   . Hypertension Sister   . Hypertension Brother   . Stroke Sister   . Heart attack Neg Hx     Social History    Social History   Social History  . Marital status: Married    Spouse name: N/A  . Number of children: N/A  . Years of education: N/A   Occupational History  . Not on file.   Social History Main  Topics  . Smoking status: Never Smoker  . Smokeless tobacco: Never Used  . Alcohol use No     Comment: 89/1/17- "stopped over 6 months ago"  . Drug use: No  . Sexual activity: Not on file   Other Topics Concern  . Not on file   Social History Narrative  . No narrative on file     Review of Systems    General:  No chills, fever, night sweats or weight changes.  Cardiovascular:  No chest pain, dyspnea on exertion, edema, orthopnea, palpitations, paroxysmal nocturnal dyspnea. Dermatological: No rash, lesions/masses Respiratory: No cough, dyspnea Urologic: No hematuria, dysuria Abdominal:   No nausea, vomiting, diarrhea, bright red blood per rectum, melena, or hematemesis Neurologic:  No wkns, changes in mental status. Positive for vision changes. All other systems reviewed and are otherwise negative except as noted above.  Physical Exam    Blood pressure 132/84, pulse 77, temperature 97.7 F (36.5 C), temperature source Oral, resp. rate 18, height 5' 10.5" (1.791 m), weight 165 lb (74.8 kg), SpO2 100 %.  General: Pleasant, African American male appearing in NAD. Psych: Normal affect. Neuro: Alert and oriented X 3. Moves all extremities spontaneously. HEENT: Normal  Neck: Supple without bruits or JVD. Lungs:  Resp regular and unlabored, CTA without wheezing or rales. Heart: Regular rhythm, tachycardiac rate, no s3, s4, or murmurs. Abdomen: Soft, non-tender, non-distended, BS + x 4.  Extremities: No clubbing, cyanosis or edema. DP/PT/Radials 2+ and equal bilaterally.  Labs    Troponin (Point of Care Test) No results for input(s): TROPIPOC in the last 72 hours. No results for input(s): CKTOTAL, CKMB, TROPONINI in the last 72 hours. Lab Results  Component Value Date   WBC 7.1 04/14/2016   HGB 17.4 (H) 04/14/2016   HCT 52.2 (H) 04/14/2016   MCV 82.1 04/14/2016   PLT 162 04/14/2016     Recent Labs Lab 04/14/16 0956  NA 139  K 4.1  CL 109  CO2 19*  BUN 17    CREATININE 1.81*  CALCIUM 9.0  PROT 7.8  BILITOT 1.1  ALKPHOS 96  ALT 20  AST 24  GLUCOSE 137*    Radiology Studies    No results found.  EKG & Cardiac Imaging    EKG:  04/14/2016 0918: NSR, HR 84, with 1st degree AV Block.  04/14/2016 1018: Wide-QRS tachycardia with HR of 146.   Echocardiogram: 12/2013 Study Conclusions  - Left ventricle: The cavity size was normal. Wall thickness was increased in a pattern of mild LVH. Systolic function was normal. The estimated ejection fraction was in the range of 60% to 65%. Wall motion  was normal; there were no regional wall motion abnormalities. There was an increased relative contribution of atrial contraction to ventricular filling. - Aortic valve: Mild regurgitation. - Mitral valve: Calcified annulus. Mildly thickened leaflets  Assessment & Plan    1. New-Onset Tachycardia - SVT vs. AVNRT. 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 Initially in NSR but noted to be tachycardiac and repeat EKG showed a wide-QRS tachycardia with HR of 146. Reported not taking his PO Coreg this morning. Asymptomatic while in the rhythm. - initially given IV Lopressor 5mg  once which slowed his HR into the 120's but EKG remained concerning for SVT. Adenosine 6mg  administered with no change in rhythm. Given additional 12mg  Adenosine with pause noted and return to NSR.  - will continue on Coreg 12.5mg  BID. May need further titration of BB if HR and BP allows. Consider possible ablation if he continues to have recurrent episodes. Have arranged for close Cardiology follow-up on 04/24/2016 (appointment information given to patient and family at the bedside).   2. Chronic Diastolic CHF - echo from 99991111 showed preserved EF of 60-65% with no WMA. Does not appear volume overloaded on physical exam.  - continue current medication regimen.   3. Stage 3 CKD - creatinine 1.81 today, close to  baseline.  Signed, Erma Heritage, PA-C 04/14/2016, 12:29 PM Pager: [phone]   I have personally seen and examined this patient with [place]. I agree with the assessment and plan as outlined above. I was present in his room for the administration of IV adenosine. He was in SVT. This did not break with IV Lopressor. 6mg  adenosine IV followed by 12 mg IV adenosine with return to sinus rhythm. He missed his dose of Coreg this am. REsume Coreg. Ok to let him go home today. Close cardiology follow up. He has no complaints.  Lauree Chandler 04/14/2016 12:41 PM

## 2016-04-16 DIAGNOSIS — E785 Hyperlipidemia, unspecified: Secondary | ICD-10-CM | POA: Diagnosis not present

## 2016-04-16 DIAGNOSIS — N183 Chronic kidney disease, stage 3 (moderate): Secondary | ICD-10-CM | POA: Diagnosis not present

## 2016-04-16 DIAGNOSIS — I2510 Atherosclerotic heart disease of native coronary artery without angina pectoris: Secondary | ICD-10-CM | POA: Diagnosis not present

## 2016-04-16 DIAGNOSIS — Z8673 Personal history of transient ischemic attack (TIA), and cerebral infarction without residual deficits: Secondary | ICD-10-CM | POA: Diagnosis not present

## 2016-04-16 DIAGNOSIS — I129 Hypertensive chronic kidney disease with stage 1 through stage 4 chronic kidney disease, or unspecified chronic kidney disease: Secondary | ICD-10-CM | POA: Diagnosis not present

## 2016-04-16 DIAGNOSIS — K219 Gastro-esophageal reflux disease without esophagitis: Secondary | ICD-10-CM | POA: Diagnosis not present

## 2016-04-16 DIAGNOSIS — I5032 Chronic diastolic (congestive) heart failure: Secondary | ICD-10-CM | POA: Diagnosis not present

## 2016-04-20 DIAGNOSIS — I5032 Chronic diastolic (congestive) heart failure: Secondary | ICD-10-CM | POA: Diagnosis not present

## 2016-04-20 DIAGNOSIS — N183 Chronic kidney disease, stage 3 (moderate): Secondary | ICD-10-CM | POA: Diagnosis not present

## 2016-04-20 DIAGNOSIS — I129 Hypertensive chronic kidney disease with stage 1 through stage 4 chronic kidney disease, or unspecified chronic kidney disease: Secondary | ICD-10-CM | POA: Diagnosis not present

## 2016-04-20 DIAGNOSIS — E785 Hyperlipidemia, unspecified: Secondary | ICD-10-CM | POA: Diagnosis not present

## 2016-04-20 DIAGNOSIS — I2510 Atherosclerotic heart disease of native coronary artery without angina pectoris: Secondary | ICD-10-CM | POA: Diagnosis not present

## 2016-04-20 DIAGNOSIS — K219 Gastro-esophageal reflux disease without esophagitis: Secondary | ICD-10-CM | POA: Diagnosis not present

## 2016-04-20 DIAGNOSIS — Z8673 Personal history of transient ischemic attack (TIA), and cerebral infarction without residual deficits: Secondary | ICD-10-CM | POA: Diagnosis not present

## 2016-04-21 ENCOUNTER — Ambulatory Visit (INDEPENDENT_AMBULATORY_CARE_PROVIDER_SITE_OTHER): Payer: Medicare Other | Admitting: Ophthalmology

## 2016-04-22 ENCOUNTER — Encounter: Payer: Self-pay | Admitting: Physician Assistant

## 2016-04-24 ENCOUNTER — Ambulatory Visit (INDEPENDENT_AMBULATORY_CARE_PROVIDER_SITE_OTHER): Payer: Medicare Other | Admitting: Physician Assistant

## 2016-04-24 ENCOUNTER — Encounter: Payer: Self-pay | Admitting: Physician Assistant

## 2016-04-24 VITALS — BP 104/64 | HR 51 | Ht 70.5 in | Wt 169.1 lb

## 2016-04-24 DIAGNOSIS — I471 Supraventricular tachycardia, unspecified: Secondary | ICD-10-CM

## 2016-04-24 DIAGNOSIS — I5032 Chronic diastolic (congestive) heart failure: Secondary | ICD-10-CM | POA: Diagnosis not present

## 2016-04-24 DIAGNOSIS — I2510 Atherosclerotic heart disease of native coronary artery without angina pectoris: Secondary | ICD-10-CM

## 2016-04-24 DIAGNOSIS — I10 Essential (primary) hypertension: Secondary | ICD-10-CM | POA: Diagnosis not present

## 2016-04-24 LAB — TSH: TSH: 3.72 mIU/L (ref 0.40–4.50)

## 2016-04-24 NOTE — Progress Notes (Signed)
Cardiology Office Note    Date:  04/24/2016  ID:  Trevor Murphy, DOB Apr 07, 1935, MRN [id number] PCP:  Trevor Greenland, MD  Cardiologist:  Aundra Dubin  Chief Complaint: f/u SVT  History of Present Illness:  Trevor Murphy is a 81 y.o. male with history of chronic diastolic CHF, SVT (04/7025; also 12/2013 associated with minimal TnI elevation with subsequent normal stress test 01/2014), minimal nonobstructive CAD by cath 2011, HTN, CKD stage III, stroke, L eye blindness, h/o EtOH abuse who presents for post-hospital followup. 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 Initial EKG showed NSR, HR 84, with 1st degree AV Block. While in the pre-op area he was noted to have WBC with HR 120s-140s. He received adenosine with conversion to NSR - felt to represent SVT. He had missed his dose of Coreg that morning. Labs 04/14/16: K 4.1, BUN 17, Cr 1.81, Hgb 17.4.  Returns for f/u today, doing well. The eye surgery was postponed due to the above. He denies any awareness of any palpitations. No CP. Has chronic DOE which he states is stable and unchanged for many years. Accompanied by his [name] - ph [phone]. He consents to having her receive calls about his medical information. No syncope, LEE.    Past Medical History:  Diagnosis Date  . Arthritis   . Blindness of left eye   . CAD (coronary artery disease)    a. Cath 08/2009: minimal nonobstructive disease (25% LAD). b. Lexiscan nuc 6/215: EF 54%, no ischemia or infarction.  . Chronic diastolic CHF (congestive heart failure) ([place])    a. Echo 2013: EF 55-60%, mild AI, mild MR. b. Echo 5/15 with EF 60-65%, no WMA, mild LVH, mild AI.  [name] Kitchen CKD (chronic kidney disease) stage 3, GFR 30-59 ml/min 07/26/2012   a. Felt likely due to HTN.  . CVA (cerebral infarction) 07/28/2012  . Glaucoma   . Gout   . H/O umbilical hernia repair   . Headache(784.0)    " WHEN MY BLOOD PRESSURE  IS UP"  . History of acute renal failure   . History of alcohol abuse   . HTN (hypertension)   . Hypertension    a. Patent renal arteries 2005.  [name] Kitchen Noncompliance   . OA (osteoarthritis)   . Other and unspecified hyperlipidemia   . Poor circulation   . SVT (supraventricular tachycardia) ([place])    a. Possible AVNRT.  Got adenosine from EMS in 2015 for SVT.  During 5/15 hospitalization had episode of short R-P tachycardia (possible AVNRT) terminated by adenosine.  b. 04/2016 - recurrent tachycardia (SVT vs. AVNRT), again terminated by Adenosine    Past Surgical History:  Procedure Laterality Date  . APPENDECTOMY    . CARDIAC CATHETERIZATION  11/2003   EF 50-55%  . CARDIAC CATHETERIZATION  08/2009  . CATARACT EXTRACTION EXTRACAPSULAR Right 05/17/2013   Procedure: CATARACT EXTRACTION EXTRACAPSULAR WITH INTRAOCULAR LENS PLACEMENT (IOC) RIGHT EYE;  Surgeon: Marylynn Pearson, MD;  Location: [place];  Service: Ophthalmology;  Laterality: Right;  . COLONOSCOPY    . HERNIA REPAIR    . VENTRAL HERNIA REPAIR  03/07/2012   Procedure: LAPAROSCOPIC VENTRAL HERNIA;  Surgeon: Gwenyth Ober, MD;  Location: [place];  Service: General;  Laterality: N/A;    Current Medications: Current Outpatient Prescriptions  Medication Sig Dispense Refill  . acetaminophen (TYLENOL) 500 MG tablet Take 1,000 mg by mouth every 6 (six) hours as  needed for headache.    . allopurinol (ZYLOPRIM) 100 MG tablet Take 100 mg by mouth daily.    [name] Kitchen amLODipine (NORVASC) 10 MG tablet Take 10 mg by mouth daily.    [name] Kitchen aspirin EC 81 MG tablet Take 1 tablet (81 mg total) by mouth daily. 90 tablet 3  . carvedilol (COREG) 12.5 MG tablet Take 1 tablet (12.5 mg total) by mouth 2 (two) times daily. 180 tablet 3  . cloNIDine (CATAPRES) 0.1 MG tablet Take 0.1 mg by mouth 3 (three) times daily.    . furosemide (LASIX) 20 MG tablet Take 20 mg by mouth daily.    . hydrALAZINE (APRESOLINE) 100 MG tablet Take 100 mg by mouth 3 (three) times daily.    .  isosorbide mononitrate (IMDUR) 30 MG 24 hr tablet Take 30 mg by mouth daily.    . nitroGLYCERIN (NITROSTAT) 0.4 MG SL tablet Place 0.4 mg under the tongue every 5 (five) minutes as needed for chest pain. X 3 doses    . potassium chloride (K-DUR) 10 MEQ tablet Take 10 mEq by mouth every other day. Every other day with lasix    . rosuvastatin (CRESTOR) 5 MG tablet Take 1 tablet (5 mg total) by mouth daily. 90 tablet 3   No current facility-administered medications for this visit.      Allergies:   No known allergies   Social History   Social History  . Marital status: Married    Spouse name: N/A  . Number of children: N/A  . Years of education: N/A   Social History Main Topics  . Smoking status: Never Smoker  . Smokeless tobacco: Never Used  . Alcohol use No     Comment: 89/1/17- "stopped over 6 months ago"  . Drug use: No  . Sexual activity: Not Asked   Other Topics Concern  . None   Social History Narrative  . None     Family History:  The patient's family history includes CVA in his mother; Hypertension in his brother, brother, and sister; Stroke in his sister.   ROS:   Please see the history of present illness.  All other systems are reviewed and otherwise negative.    PHYSICAL EXAM:   VS:  BP 104/64   Pulse (!) 51   Ht 5' 10.5" (1.791 m)   Wt 169 lb 1.9 oz (76.7 kg)   BMI 23.92 kg/m   BMI: Body mass index is 23.92 kg/m. GEN: Well nourished, well developed AAM, in no acute distress  HEENT: normocephalic, atraumatic, arcus senilis Neck: no JVD, carotid bruits, or masses Cardiac: RRR; no murmurs, rubs, or gallops, no edema  Respiratory:  clear to auscultation bilaterally, normal work of breathing GI: soft, nontender, nondistended, + BS MS: no deformity or atrophy  Skin: warm and dry, no rash Neuro:  Alert and Oriented x 3, Strength and sensation are intact, follows commands Psych: euthymic mood, full affect  Wt Readings from Last 3 Encounters:  04/24/16 169  lb 1.9 oz (76.7 kg)  04/14/16 165 lb (74.8 kg)  02/05/15 164 lb 14.5 oz (74.8 kg)      Studies/Labs Reviewed:   EKG:  EKG was ordered today and personally reviewed by me and demonstrates sinus bradycardia 51bpm, 1st degree AVB, nonspecific T wave changes  Recent Labs: 04/14/2016: ALT 20; BUN 17; Creatinine, Ser 1.81; Hemoglobin 17.4; Platelets 162; Potassium 4.1; Sodium 139   Lipid Panel    Component Value Date/Time   CHOL 163 04/11/2014 2150  TRIG 192 (H) 04/11/2014 2150   HDL 39 (L) 04/11/2014 2150   CHOLHDL 4.2 04/11/2014 2150   VLDL 38 04/11/2014 2150   LDLCALC 86 04/11/2014 2150    Additional studies/ records that were reviewed today include: Summarized above.    ASSESSMENT & PLAN:   1. PSVT - converted with adenosine. He had 2 prior episodes in 2015 as well. He had not taken Coreg the AM of his surgery which may have contributed. The issue of ablation was broached with him and his family in the hospital and they are interested in speaking to EP about this. Will refer. 2. Essential HTN - BP recheck 105/72. No symptoms of low BP. Continue present regimen and follow closely. If BP is still on the low side at f/u visit, would consider decreasing clonidine to 0.1mg  BID. His current regimen indicates to me that there have been difficulties in the past controlling his BP to begin with. 3. Chronic diastolic CHF - appears euvolemic by exam. 4. Mild CAD - denies any recent chest pain. Chronic dyspnea is stable and unchanged.  Disposition: Refer to EP.  Medication Adjustments/Labs and Tests Ordered: Current medicines are reviewed at length with the patient today.  Concerns regarding medicines are outlined above. Medication changes, Labs and Tests ordered today are summarized above and listed in the Patient Instructions accessible in Encounters.   Raechel Ache PA-C  04/24/2016 3:04 PM    [facility] [address], [place], [place]  [address] Phone:  [phone]; Fax: [phone]

## 2016-04-24 NOTE — Patient Instructions (Signed)
Your physician recommends that you continue on your current medications as directed. Please refer to the Current Medication list given to you today. Your physician recommends that you return for lab work today (TSH)  You have been referred to Cardiology--Electrophysiology to further treat your heart rhythm.

## 2016-04-27 DIAGNOSIS — I2510 Atherosclerotic heart disease of native coronary artery without angina pectoris: Secondary | ICD-10-CM | POA: Diagnosis not present

## 2016-04-27 DIAGNOSIS — K219 Gastro-esophageal reflux disease without esophagitis: Secondary | ICD-10-CM | POA: Diagnosis not present

## 2016-04-27 DIAGNOSIS — E785 Hyperlipidemia, unspecified: Secondary | ICD-10-CM | POA: Diagnosis not present

## 2016-04-27 DIAGNOSIS — Z8673 Personal history of transient ischemic attack (TIA), and cerebral infarction without residual deficits: Secondary | ICD-10-CM | POA: Diagnosis not present

## 2016-04-27 DIAGNOSIS — I5032 Chronic diastolic (congestive) heart failure: Secondary | ICD-10-CM | POA: Diagnosis not present

## 2016-04-27 DIAGNOSIS — N183 Chronic kidney disease, stage 3 (moderate): Secondary | ICD-10-CM | POA: Diagnosis not present

## 2016-04-27 DIAGNOSIS — I129 Hypertensive chronic kidney disease with stage 1 through stage 4 chronic kidney disease, or unspecified chronic kidney disease: Secondary | ICD-10-CM | POA: Diagnosis not present

## 2016-04-29 ENCOUNTER — Encounter: Payer: Self-pay | Admitting: Podiatry

## 2016-04-29 ENCOUNTER — Ambulatory Visit (INDEPENDENT_AMBULATORY_CARE_PROVIDER_SITE_OTHER): Payer: Medicare Other | Admitting: Podiatry

## 2016-04-29 VITALS — BP 131/75 | HR 52

## 2016-04-29 DIAGNOSIS — M79676 Pain in unspecified toe(s): Secondary | ICD-10-CM | POA: Diagnosis not present

## 2016-04-29 DIAGNOSIS — M79609 Pain in unspecified limb: Secondary | ICD-10-CM

## 2016-04-29 DIAGNOSIS — B351 Tinea unguium: Secondary | ICD-10-CM | POA: Diagnosis not present

## 2016-04-29 DIAGNOSIS — L609 Nail disorder, unspecified: Secondary | ICD-10-CM | POA: Diagnosis not present

## 2016-04-29 DIAGNOSIS — L603 Nail dystrophy: Secondary | ICD-10-CM | POA: Diagnosis not present

## 2016-04-29 DIAGNOSIS — L608 Other nail disorders: Secondary | ICD-10-CM

## 2016-04-29 NOTE — Progress Notes (Signed)
   Subjective:    Patient ID: Trevor Murphy, male    DOB: 01-08-1935, 81 y.o.   MRN: [id number]  HPI    Review of Systems  Eyes: Positive for visual disturbance.  Respiratory: Positive for shortness of breath.   Neurological: Positive for dizziness and headaches.       Objective:   Physical Exam        Assessment & Plan:

## 2016-04-30 DIAGNOSIS — I129 Hypertensive chronic kidney disease with stage 1 through stage 4 chronic kidney disease, or unspecified chronic kidney disease: Secondary | ICD-10-CM | POA: Diagnosis not present

## 2016-04-30 DIAGNOSIS — E785 Hyperlipidemia, unspecified: Secondary | ICD-10-CM | POA: Diagnosis not present

## 2016-04-30 DIAGNOSIS — N183 Chronic kidney disease, stage 3 (moderate): Secondary | ICD-10-CM | POA: Diagnosis not present

## 2016-04-30 DIAGNOSIS — K219 Gastro-esophageal reflux disease without esophagitis: Secondary | ICD-10-CM | POA: Diagnosis not present

## 2016-04-30 DIAGNOSIS — I5032 Chronic diastolic (congestive) heart failure: Secondary | ICD-10-CM | POA: Diagnosis not present

## 2016-04-30 DIAGNOSIS — I2510 Atherosclerotic heart disease of native coronary artery without angina pectoris: Secondary | ICD-10-CM | POA: Diagnosis not present

## 2016-04-30 DIAGNOSIS — Z8673 Personal history of transient ischemic attack (TIA), and cerebral infarction without residual deficits: Secondary | ICD-10-CM | POA: Diagnosis not present

## 2016-05-02 ENCOUNTER — Observation Stay (HOSPITAL_COMMUNITY)
Admission: EM | Admit: 2016-05-02 | Discharge: 2016-05-04 | Disposition: A | Discharge status: Home / Self Care | Payer: Medicare Other | Attending: Internal Medicine | Admitting: Internal Medicine

## 2016-05-02 ENCOUNTER — Encounter (HOSPITAL_COMMUNITY): Payer: Self-pay | Admitting: Emergency Medicine

## 2016-05-02 ENCOUNTER — Emergency Department (HOSPITAL_COMMUNITY): Payer: Medicare Other

## 2016-05-02 DIAGNOSIS — I10 Essential (primary) hypertension: Secondary | ICD-10-CM

## 2016-05-02 DIAGNOSIS — I959 Hypotension, unspecified: Principal | ICD-10-CM

## 2016-05-02 DIAGNOSIS — M109 Gout, unspecified: Secondary | ICD-10-CM | POA: Diagnosis present

## 2016-05-02 DIAGNOSIS — R5383 Other fatigue: Secondary | ICD-10-CM | POA: Diagnosis not present

## 2016-05-02 DIAGNOSIS — R001 Bradycardia, unspecified: Secondary | ICD-10-CM | POA: Diagnosis present

## 2016-05-02 DIAGNOSIS — M1A9XX0 Chronic gout, unspecified, without tophus (tophi): Secondary | ICD-10-CM | POA: Diagnosis not present

## 2016-05-02 DIAGNOSIS — N183 Chronic kidney disease, stage 3 unspecified: Secondary | ICD-10-CM | POA: Diagnosis present

## 2016-05-02 DIAGNOSIS — R0602 Shortness of breath: Secondary | ICD-10-CM | POA: Diagnosis not present

## 2016-05-02 DIAGNOSIS — Z79899 Other long term (current) drug therapy: Secondary | ICD-10-CM | POA: Insufficient documentation

## 2016-05-02 DIAGNOSIS — Z8673 Personal history of transient ischemic attack (TIA), and cerebral infarction without residual deficits: Secondary | ICD-10-CM | POA: Insufficient documentation

## 2016-05-02 DIAGNOSIS — Z955 Presence of coronary angioplasty implant and graft: Secondary | ICD-10-CM | POA: Diagnosis not present

## 2016-05-02 DIAGNOSIS — Z87898 Personal history of other specified conditions: Secondary | ICD-10-CM

## 2016-05-02 DIAGNOSIS — I2510 Atherosclerotic heart disease of native coronary artery without angina pectoris: Secondary | ICD-10-CM | POA: Insufficient documentation

## 2016-05-02 DIAGNOSIS — I5032 Chronic diastolic (congestive) heart failure: Secondary | ICD-10-CM | POA: Diagnosis not present

## 2016-05-02 DIAGNOSIS — Z7982 Long term (current) use of aspirin: Secondary | ICD-10-CM | POA: Diagnosis not present

## 2016-05-02 DIAGNOSIS — I130 Hypertensive heart and chronic kidney disease with heart failure and stage 1 through stage 4 chronic kidney disease, or unspecified chronic kidney disease: Secondary | ICD-10-CM | POA: Diagnosis present

## 2016-05-02 DIAGNOSIS — R031 Nonspecific low blood-pressure reading: Secondary | ICD-10-CM | POA: Diagnosis not present

## 2016-05-02 DIAGNOSIS — N1832 Chronic kidney disease, stage 3b: Secondary | ICD-10-CM | POA: Diagnosis present

## 2016-05-02 DIAGNOSIS — I952 Hypotension due to drugs: Secondary | ICD-10-CM | POA: Diagnosis present

## 2016-05-02 LAB — BASIC METABOLIC PANEL
Anion gap: 6 (ref 5–15)
BUN: 12 mg/dL (ref 6–20)
CO2: 17 mmol/L — ABNORMAL LOW (ref 22–32)
Calcium: 8.1 mg/dL — ABNORMAL LOW (ref 8.9–10.3)
Chloride: 113 mmol/L — ABNORMAL HIGH (ref 101–111)
Creatinine, Ser: 1.5 mg/dL — ABNORMAL HIGH (ref 0.61–1.24)
GFR calc Af Amer: 49 mL/min — ABNORMAL LOW (ref >60)
GFR calc non Af Amer: 43 mL/min — ABNORMAL LOW (ref >60)
Glucose, Bld: 141 mg/dL — ABNORMAL HIGH (ref 65–99)
Potassium: 3.5 mmol/L (ref 3.5–5.1)
Sodium: 136 mmol/L (ref 135–145)

## 2016-05-02 LAB — I-STAT TROPONIN, ED
Troponin i, poc: 0.01 ng/mL (ref 0.00–0.08)
Troponin i, poc: 0 ng/mL (ref 0.00–0.08)

## 2016-05-02 LAB — CBC
HCT: 43.1 % (ref 39.0–52.0)
Hemoglobin: 13.9 g/dL (ref 13.0–17.0)
MCH: 26.6 pg (ref 26.0–34.0)
MCHC: 32.3 g/dL (ref 30.0–36.0)
MCV: 82.4 fL (ref 78.0–100.0)
Platelets: 167 10*3/uL (ref 150–400)
RBC: 5.23 MIL/uL (ref 4.22–5.81)
RDW: 15.3 % (ref 11.5–15.5)
WBC: 4.6 10*3/uL (ref 4.0–10.5)

## 2016-05-02 LAB — BRAIN NATRIURETIC PEPTIDE: B Natriuretic Peptide: 28.3 pg/mL (ref 0.0–100.0)

## 2016-05-02 LAB — MAGNESIUM: Magnesium: 2 mg/dL (ref 1.7–2.4)

## 2016-05-02 MED ORDER — ENOXAPARIN SODIUM 40 MG/0.4ML ~~LOC~~ SOLN
40.0000 mg | SUBCUTANEOUS | Status: DC
  Administered 2016-05-02 – 2016-05-03 (×2): 40 mg via SUBCUTANEOUS
  Filled 2016-05-02 (×2): qty 0.4

## 2016-05-02 MED ORDER — AMLODIPINE BESYLATE 10 MG PO TABS: 10.0000 mg | ORAL_TABLET | Freq: Every day | ORAL | Status: DC

## 2016-05-02 MED ORDER — ACETAMINOPHEN 325 MG PO TABS
650.0000 mg | ORAL_TABLET | Freq: Once | ORAL | Status: AC
  Administered 2016-05-02: 650 mg via ORAL
  Filled 2016-05-02: qty 2

## 2016-05-02 MED ORDER — SODIUM CHLORIDE 0.9% FLUSH
3.0000 mL | Freq: Two times a day (BID) | INTRAVENOUS | Status: DC
  Administered 2016-05-02 – 2016-05-04 (×4): 3 mL via INTRAVENOUS

## 2016-05-02 MED ORDER — HYDRALAZINE HCL 50 MG PO TABS
100.0000 mg | ORAL_TABLET | Freq: Three times a day (TID) | ORAL | Status: DC
  Administered 2016-05-03 – 2016-05-04 (×4): 100 mg via ORAL
  Filled 2016-05-02 (×4): qty 2

## 2016-05-02 MED ORDER — ALLOPURINOL 100 MG PO TABS
100.0000 mg | ORAL_TABLET | Freq: Every day | ORAL | Status: DC
  Administered 2016-05-03 – 2016-05-04 (×2): 100 mg via ORAL
  Filled 2016-05-02 (×2): qty 1

## 2016-05-02 MED ORDER — ROSUVASTATIN CALCIUM 10 MG PO TABS
5.0000 mg | ORAL_TABLET | Freq: Every day | ORAL | Status: DC
  Administered 2016-05-03 – 2016-05-04 (×2): 5 mg via ORAL
  Filled 2016-05-02 (×2): qty 1

## 2016-05-02 MED ORDER — ASPIRIN EC 81 MG PO TBEC
81.0000 mg | DELAYED_RELEASE_TABLET | Freq: Every day | ORAL | Status: DC
  Administered 2016-05-03 – 2016-05-04 (×2): 81 mg via ORAL
  Filled 2016-05-02 (×2): qty 1

## 2016-05-02 MED ORDER — ISOSORBIDE MONONITRATE ER 30 MG PO TB24
30.0000 mg | ORAL_TABLET | Freq: Every day | ORAL | Status: DC
  Administered 2016-05-03 – 2016-05-04 (×2): 30 mg via ORAL
  Filled 2016-05-02 (×2): qty 1

## 2016-05-02 MED ORDER — SODIUM CHLORIDE 0.9 % IV BOLUS (SEPSIS)
1000.0000 mL | Freq: Once | INTRAVENOUS | Status: AC
Start: 2016-05-02 — End: 2016-05-02
  Administered 2016-05-02: 1000 mL via INTRAVENOUS

## 2016-05-02 MED ORDER — ACETAMINOPHEN 500 MG PO TABS
1000.0000 mg | ORAL_TABLET | Freq: Four times a day (QID) | ORAL | Status: DC | PRN
  Administered 2016-05-03: 1000 mg via ORAL
  Filled 2016-05-02: qty 2

## 2016-05-02 NOTE — ED Notes (Signed)
Attempted report 

## 2016-05-02 NOTE — ED Notes (Signed)
Pt to xray

## 2016-05-02 NOTE — ED Triage Notes (Signed)
Pt from assisted living. C/o SOB for several hours. HR 50s, takes a beta blocker.. Lung sounds clear. 12 lead unremarkable. BP 90/60. Given 200CCs fluid now 100/70. Denies CP. CBG 169. LAC 18. AAOx4.

## 2016-05-02 NOTE — ED Triage Notes (Signed)
[facility] is where pt is from.

## 2016-05-02 NOTE — H&P (Signed)
History and Physical    [name] CVE:[id number] DOB: 11-08-1934 DOA: 05/02/2016   PCP: Maximino Greenland, MD Chief Complaint:  Chief Complaint  Patient presents with  . Bradycardia  . Shortness of Breath  . Fatigue    HPI: Trevor Murphy is a 81 y.o. male with medical history significant of Chronic diastolic CHF, HTN, gout.  Patient presents to the ED with c/o feeling generally weak, SOB, and fatigued at NH for past 2 days.  Today he was unable to ambulate.  BP was noted to be running low (02H systolic) at the NH, and HR was also low at 50.  Patient actually just started a medication (coreg) for tachycardia (SVT or AVNRT) that was discovered incidentally on the 5th of this month during an admission for an eye surgery (see cards consult note from then).  ED Course: Given IVF and BP now up to 852 systolic when laying down, but still weak with ambulation.  Review of Systems: As per HPI otherwise 10 point review of systems negative.    Past Medical History:  Diagnosis Date  . Arthritis   . Blindness of left eye   . CAD (coronary artery disease)    a. Cath 08/2009: minimal nonobstructive disease (25% LAD). b. Lexiscan nuc 6/215: EF 54%, no ischemia or infarction.  . Chronic diastolic CHF (congestive heart failure) ([place])    a. Echo 2013: EF 55-60%, mild AI, mild MR. b. Echo 5/15 with EF 60-65%, no WMA, mild LVH, mild AI.  [name] Kitchen CKD (chronic kidney disease) stage 3, GFR 30-59 ml/min 07/26/2012   a. Felt likely due to HTN.  . CVA (cerebral infarction) 07/28/2012  . Glaucoma   . Gout   . H/O umbilical hernia repair   . Headache(784.0)    " WHEN MY BLOOD PRESSURE IS UP"  . History of acute renal failure   . History of alcohol abuse   . HTN (hypertension)   . Hypertension    a. Patent renal arteries 2005.  [name] Kitchen Noncompliance   . OA (osteoarthritis)   . Other and unspecified hyperlipidemia   . Poor circulation   . SVT (supraventricular tachycardia) ([place])    a. Possible AVNRT.  Got  adenosine from EMS in 2015 for SVT.  During 5/15 hospitalization had episode of short R-P tachycardia (possible AVNRT) terminated by adenosine.  b. 04/2016 - recurrent tachycardia (SVT vs. AVNRT), again terminated by Adenosine    Past Surgical History:  Procedure Laterality Date  . APPENDECTOMY    . CARDIAC CATHETERIZATION  11/2003   EF 50-55%  . CARDIAC CATHETERIZATION  08/2009  . CATARACT EXTRACTION EXTRACAPSULAR Right 05/17/2013   Procedure: CATARACT EXTRACTION EXTRACAPSULAR WITH INTRAOCULAR LENS PLACEMENT (IOC) RIGHT EYE;  Surgeon: Marylynn Pearson, MD;  Location: [place];  Service: Ophthalmology;  Laterality: Right;  . COLONOSCOPY    . HERNIA REPAIR    . VENTRAL HERNIA REPAIR  03/07/2012   Procedure: LAPAROSCOPIC VENTRAL HERNIA;  Surgeon: Gwenyth Ober, MD;  Location: [place];  Service: General;  Laterality: N/A;     reports that he has never smoked. He has never used smokeless tobacco. He reports that he does not drink alcohol or use drugs.  Allergies  Allergen Reactions  . No Known Allergies     Family History  Problem Relation Age of Onset  . CVA Mother   . Hypertension Brother   . Hypertension Sister   . Hypertension Brother   . Stroke Sister   . Heart attack Neg  Hx       Prior to Admission medications   Medication Sig Start Date End Date Taking? Authorizing Provider  acetaminophen (TYLENOL) 500 MG tablet Take 1,000 mg by mouth every 6 (six) hours as needed for headache.    Historical Provider, MD  allopurinol (ZYLOPRIM) 100 MG tablet Take 100 mg by mouth daily.    Historical Provider, MD  amLODipine (NORVASC) 10 MG tablet Take 10 mg by mouth daily.    Historical Provider, MD  aspirin EC 81 MG tablet Take 1 tablet (81 mg total) by mouth daily. 03/07/15   Larey Dresser, MD  carvedilol (COREG) 12.5 MG tablet Take 1 tablet (12.5 mg total) by mouth 2 (two) times daily. 03/07/15   Larey Dresser, MD  cloNIDine (CATAPRES) 0.1 MG tablet Take 0.1 mg by mouth 3 (three) times daily.     Historical Provider, MD  furosemide (LASIX) 20 MG tablet Take 20 mg by mouth daily.    Historical Provider, MD  hydrALAZINE (APRESOLINE) 100 MG tablet Take 100 mg by mouth 3 (three) times daily.    Historical Provider, MD  isosorbide mononitrate (IMDUR) 30 MG 24 hr tablet Take 30 mg by mouth daily.    Historical Provider, MD  nitroGLYCERIN (NITROSTAT) 0.4 MG SL tablet Place 0.4 mg under the tongue every 5 (five) minutes as needed for chest pain. X 3 doses    Historical Provider, MD  potassium chloride (K-DUR) 10 MEQ tablet Take 10 mEq by mouth every other day. Every other day with lasix    Historical Provider, MD  rosuvastatin (CRESTOR) 5 MG tablet Take 1 tablet (5 mg total) by mouth daily. 03/07/15   Larey Dresser, MD    Physical Exam: Vitals:   05/02/16 1945 05/02/16 2015 05/02/16 2045 05/02/16 2115  BP: 125/86 117/71 123/64 135/87  Pulse: 78 70 63 74  Resp: 18 16 18 16   Temp:      TempSrc:      SpO2: 95% 92% 93% 95%      Constitutional: NAD, calm, comfortable Eyes: PERRL, lids and conjunctivae normal ENMT: Mucous membranes are moist. Posterior pharynx clear of any exudate or lesions.Normal dentition.  Neck: normal, supple, no masses, no thyromegaly Respiratory: clear to auscultation bilaterally, no wheezing, no crackles. Normal respiratory effort. No accessory muscle use.  Cardiovascular: Regular rate and rhythm, no murmurs / rubs / gallops. No extremity edema. 2+ pedal pulses. No carotid bruits.  Abdomen: no tenderness, no masses palpated. No hepatosplenomegaly. Bowel sounds positive.  Musculoskeletal: no clubbing / cyanosis. No joint deformity upper and lower extremities. Good ROM, no contractures. Normal muscle tone.  Skin: no rashes, lesions, ulcers. No induration Neurologic: CN 2-12 grossly intact. Sensation intact, DTR normal. Strength 5/5 in all 4.  Psychiatric: Normal judgment and insight. Alert and oriented x 3. Normal mood.    Labs on Admission: I have personally  reviewed following labs and imaging studies  CBC:  Recent Labs Lab 05/02/16 1605  WBC 4.6  HGB 13.9  HCT 43.1  MCV 82.4  PLT 818   Basic Metabolic Panel:  Recent Labs Lab 05/02/16 1605  NA 136  K 3.5  CL 113*  CO2 17*  GLUCOSE 141*  BUN 12  CREATININE 1.50*  CALCIUM 8.1*  MG 2.0   GFR: Estimated Creatinine Clearance: 41.9 mL/min (by C-G formula based on SCr of 1.5 mg/dL (H)). Liver Function Tests: No results for input(s): AST, ALT, ALKPHOS, BILITOT, PROT, ALBUMIN in the last 168 hours. No results for  input(s): LIPASE, AMYLASE in the last 168 hours. No results for input(s): AMMONIA in the last 168 hours. Coagulation Profile: No results for input(s): INR, PROTIME in the last 168 hours. Cardiac Enzymes: No results for input(s): CKTOTAL, CKMB, CKMBINDEX, TROPONINI in the last 168 hours. BNP (last 3 results) No results for input(s): PROBNP in the last 8760 hours. HbA1C: No results for input(s): HGBA1C in the last 72 hours. CBG: No results for input(s): GLUCAP in the last 168 hours. Lipid Profile: No results for input(s): CHOL, HDL, LDLCALC, TRIG, CHOLHDL, LDLDIRECT in the last 72 hours. Thyroid Function Tests: No results for input(s): TSH, T4TOTAL, FREET4, T3FREE, THYROIDAB in the last 72 hours. Anemia Panel: No results for input(s): VITAMINB12, FOLATE, FERRITIN, TIBC, IRON, RETICCTPCT in the last 72 hours. Urine analysis:    Component Value Date/Time   COLORURINE YELLOW 07/08/2014 [place] 07/08/2014 0333   LABSPEC 1.010 07/08/2014 0333   PHURINE 5.5 07/08/2014 0333   GLUCOSEU NEGATIVE 07/08/2014 0333   HGBUR NEGATIVE 07/08/2014 0333   BILIRUBINUR NEGATIVE 07/08/2014 0333   KETONESUR NEGATIVE 07/08/2014 0333   PROTEINUR NEGATIVE 07/08/2014 0333   UROBILINOGEN 0.2 07/08/2014 0333   NITRITE NEGATIVE 07/08/2014 0333   LEUKOCYTESUR NEGATIVE 07/08/2014 0333   Sepsis Labs: @LABRCNTIP (procalcitonin:4,lacticidven:4) )No results found for this  or any previous visit (from the past 240 hour(s)).   Radiological Exams on Admission: Dg Chest 2 View  Result Date: 05/02/2016 CLINICAL DATA:  Shortness of breath.  Hypertension. EXAM: CHEST  2 VIEW COMPARISON:  February 23, 2014 FINDINGS: There is mild left base atelectasis. The lungs elsewhere are clear. Heart size and pulmonary vascularity are normal. No adenopathy. There is degenerative change in each shoulder and in the thoracic spine. There is mid thoracic dextroscoliosis. IMPRESSION: Mild left base atelectasis. Lungs elsewhere clear. Cardiac silhouette within normal limits. Electronically Signed   By: Lowella Grip III M.D.   On: 05/02/2016 17:33    EKG: Independently reviewed.  Assessment/Plan Principal Problem:   Sinus bradycardia Active Problems:   Essential hypertension   CKD (chronic kidney disease) stage 3, GFR 30-59 ml/min   Gout   Hypotension due to drugs   History of tachycardia    1. Sinus bradycardia and hypotension - likely due to too much in the way of BP meds (he is on 5 different BP meds, 6 if lasix is counted). 1. Holding coreg 2. Holding clonadine 3. Got IVF in ED and holding lasix for now 4. If he becomes tachycardic then resume coreg at a lower dose. 5. Tele monitor 2. HTN - will continue the hydralazine, amlodipine, and Imdur for now 3. CKD stage 3 - chronic and stable, repeat BMP in AM 4. Gout - continue allopurinol   DVT prophylaxis: Lovenox Code Status: DNR - confirmed with patient and son at bedside Family Communication: Son at bedside Consults called: None Admission status: Admit to obs   Rande Dario, [facility] Pager [phone] from 7PM-7AM  If 7AM-7PM, please contact the day physician for the patient [email or web address] Password [facility]  05/02/2016, 9:39 PM

## 2016-05-02 NOTE — ED Provider Notes (Signed)
[facility] DEPT Provider Note   CSN: [id number] Arrival date & time: 05/02/16  1521     History   Chief Complaint Chief Complaint  Patient presents with  . Bradycardia  . Shortness of Breath  . Fatigue    HPI Trevor Murphy is a 81 y.o. male.  The history is provided by the patient and a relative (son).  Altered Mental Status   This is a new problem. Episode onset: today. The problem has not changed since onset.Associated symptoms include somnolence. Pertinent negatives include no confusion, no unresponsiveness, no weakness and no agitation. Associated symptoms comments: General fatigue, generalized slowing of responses and actions. Risk factors: denies recent change in prescriptions. Son states his apartment was very warm/hot. His past medical history is significant for hypertension. His past medical history does not include dementia.    Past Medical History:  Diagnosis Date  . Arthritis   . Blindness of left eye   . CAD (coronary artery disease)    a. Cath 08/2009: minimal nonobstructive disease (25% LAD). b. Lexiscan nuc 6/215: EF 54%, no ischemia or infarction.  . Chronic diastolic CHF (congestive heart failure) ([place])    a. Echo 2013: EF 55-60%, mild AI, mild MR. b. Echo 5/15 with EF 60-65%, no WMA, mild LVH, mild AI.  [name] Kitchen CKD (chronic kidney disease) stage 3, GFR 30-59 ml/min 07/26/2012   a. Felt likely due to HTN.  . CVA (cerebral infarction) 07/28/2012  . Glaucoma   . Gout   . H/O umbilical hernia repair   . Headache(784.0)    " WHEN MY BLOOD PRESSURE IS UP"  . History of acute renal failure   . History of alcohol abuse   . HTN (hypertension)   . Hypertension    a. Patent renal arteries 2005.  [name] Kitchen Noncompliance   . OA (osteoarthritis)   . Other and unspecified hyperlipidemia   . Poor circulation   . SVT (supraventricular tachycardia) ([place])    a. Possible AVNRT.  Got adenosine from EMS in 2015 for SVT.  During 5/15 hospitalization had episode of short R-P  tachycardia (possible AVNRT) terminated by adenosine.  b. 04/2016 - recurrent tachycardia (SVT vs. AVNRT), again terminated by Adenosine    Patient Active Problem List   Diagnosis Date Noted  . Sinus bradycardia 05/02/2016  . Hypotension due to drugs 05/02/2016  . History of tachycardia 05/02/2016  . GERD (gastroesophageal reflux disease) 02/05/2015  . Mild coronary artery disease 04/20/2014  . Atypical chest pain 04/12/2014  . Hypertensive urgency 03/11/2014  . Chronic diastolic CHF (congestive heart failure) ([place]) 01/30/2014  . Alcohol abuse 01/19/2014  . SVT (supraventricular tachycardia) ([place]) 12/19/2013  . Elevated troponin 12/19/2013  . Gout 12/19/2013  . Dyslipidemia 12/16/2013  . Hypertensive crisis 12/16/2013  . CVA (cerebral infarction) 07/28/2012  . Acute on chronic renal failure ([place]) 07/27/2012  . Essential hypertension 07/26/2012  . CKD (chronic kidney disease) stage 3, GFR 30-59 ml/min 07/26/2012  . Poor circulation   . Recurrent ventral hernia 02/16/2012  . Hernia (acquired) (recurrent) 02/10/2011    Past Surgical History:  Procedure Laterality Date  . APPENDECTOMY    . CARDIAC CATHETERIZATION  11/2003   EF 50-55%  . CARDIAC CATHETERIZATION  08/2009  . CATARACT EXTRACTION EXTRACAPSULAR Right 05/17/2013   Procedure: CATARACT EXTRACTION EXTRACAPSULAR WITH INTRAOCULAR LENS PLACEMENT (IOC) RIGHT EYE;  Surgeon: Marylynn Pearson, MD;  Location: [place];  Service: Ophthalmology;  Laterality: Right;  . COLONOSCOPY    . HERNIA REPAIR    .  VENTRAL HERNIA REPAIR  03/07/2012   Procedure: LAPAROSCOPIC VENTRAL HERNIA;  Surgeon: Gwenyth Ober, MD;  Location: [place];  Service: General;  Laterality: N/A;       Home Medications    Prior to Admission medications   Medication Sig Start Date End Date Taking? Authorizing Provider  acetaminophen (TYLENOL) 500 MG tablet Take 1,000 mg by mouth every 6 (six) hours as needed for headache.   Yes Historical Provider, MD  allopurinol  (ZYLOPRIM) 100 MG tablet Take 100 mg by mouth daily.   Yes Historical Provider, MD  amLODipine (NORVASC) 10 MG tablet Take 10 mg by mouth daily.   Yes Historical Provider, MD  aspirin EC 81 MG tablet Take 1 tablet (81 mg total) by mouth daily. 03/07/15  Yes Larey Dresser, MD  carvedilol (COREG) 12.5 MG tablet Take 1 tablet (12.5 mg total) by mouth 2 (two) times daily. 03/07/15  Yes Larey Dresser, MD  cloNIDine (CATAPRES) 0.1 MG tablet Take 0.1 mg by mouth 3 (three) times daily.   Yes Historical Provider, MD  furosemide (LASIX) 20 MG tablet Take 20 mg by mouth daily.   Yes Historical Provider, MD  hydrALAZINE (APRESOLINE) 100 MG tablet Take 100 mg by mouth 3 (three) times daily.   Yes Historical Provider, MD  isosorbide mononitrate (IMDUR) 30 MG 24 hr tablet Take 30 mg by mouth daily.   Yes Historical Provider, MD  nitroGLYCERIN (NITROSTAT) 0.4 MG SL tablet Place 0.4 mg under the tongue every 5 (five) minutes as needed for chest pain. X 3 doses   Yes Historical Provider, MD  potassium chloride (K-DUR) 10 MEQ tablet Take 10 mEq by mouth every other day. Every other day with lasix   Yes Historical Provider, MD  rosuvastatin (CRESTOR) 5 MG tablet Take 1 tablet (5 mg total) by mouth daily. 03/07/15  Yes Larey Dresser, MD    Family History Family History  Problem Relation Age of Onset  . CVA Mother   . Hypertension Brother   . Hypertension Sister   . Hypertension Brother   . Stroke Sister   . Heart attack Neg Hx     Social History Social History  Substance Use Topics  . Smoking status: Never Smoker  . Smokeless tobacco: Never Used  . Alcohol use No     Comment: 89/1/17- "stopped over 6 months ago"     Allergies   No known allergies   Review of Systems Review of Systems  Constitutional: Positive for fatigue. Negative for fever.  HENT: Negative for congestion, trouble swallowing and voice change.   Eyes: Negative for visual disturbance.  Respiratory: Negative for chest tightness  and shortness of breath.   Cardiovascular: Negative for chest pain and leg swelling.  Gastrointestinal: Positive for nausea and vomiting. Negative for abdominal pain and diarrhea.       NBNB vomit x 2 today  Genitourinary: Negative for flank pain.  Musculoskeletal: Negative for back pain and neck pain.  Skin: Negative for rash.  Neurological: Positive for light-headedness. Negative for dizziness, tremors, syncope, facial asymmetry, speech difficulty, weakness and numbness.  Psychiatric/Behavioral: Negative for agitation and confusion.     Physical Exam Updated Vital Signs BP 119/73 (BP Location: Right Arm)   Pulse 74   Temp 97.7 F (36.5 C) (Oral)   Resp 18   Ht 5' 10.5" (1.791 m)   Wt 76.4 kg   SpO2 99%   BMI 23.82 kg/m   Physical Exam  Constitutional: He is oriented to  person, place, and time. He appears well-developed and well-nourished. No distress.  Pleasant, cooperative, chronically ill-appearing  HENT:  Head: Normocephalic and atraumatic.  Eyes: Conjunctivae and EOM are normal. Pupils are equal, round, and reactive to light. No scleral icterus.  Neck: Normal range of motion. Neck supple. JVD present.  JVD to 3cm at 45 degrees  Cardiovascular: Regular rhythm and intact distal pulses.  Exam reveals no gallop and no friction rub.   Bradycardic to 50  Pulmonary/Chest: Effort normal and breath sounds normal. No respiratory distress.  Abdominal: Soft. There is no tenderness.  Protuberant abdomen, pt states is baseline  Musculoskeletal: Normal range of motion. He exhibits no edema.  Neurological: He is alert and oriented to person, place, and time. No cranial nerve deficit. He exhibits normal muscle tone. Coordination normal.  Symmetric 5/5 strength to b/l Ue's and b/l Le's. Generally slow speech without slurring. Appropriate responses to questions, normal mentation  Skin: Skin is warm and dry. Capillary refill takes 2 to 3 seconds. No rash noted. He is not diaphoretic. There  is pallor.  Psychiatric: He has a normal mood and affect.  Nursing note and vitals reviewed.    ED Treatments / Results  Labs (all labs ordered are listed, but only abnormal results are displayed) Labs Reviewed  BASIC METABOLIC PANEL - Abnormal; Notable for the following:       Result Value   Chloride 113 (*)    CO2 17 (*)    Glucose, Bld 141 (*)    Creatinine, Ser 1.50 (*)    Calcium 8.1 (*)    GFR calc non Af Amer 43 (*)    GFR calc Af Amer 49 (*)    All other components within normal limits  CBC  BRAIN NATRIURETIC PEPTIDE  MAGNESIUM  CBC  BASIC METABOLIC PANEL  I-STAT TROPOININ, ED  I-STAT TROPOININ, ED    EKG  EKG Interpretation  Date/Time:  Saturday May 02 2016 15:29:19 EDT Ventricular Rate:  54 PR Interval:    QRS Duration: 109 QT Interval:  582 QTC Calculation: 552 R Axis:   58 Text Interpretation:  Sinus rhythm Prolonged PR interval Borderline T abnormalities, lateral leads Prolonged QT interval poor baseline Confirmed by Reather Converse MD, JOSHUA [phone]) on 05/02/2016 3:58:04 PM       Radiology Dg Chest 2 View  Result Date: 05/02/2016 CLINICAL DATA:  Shortness of breath.  Hypertension. EXAM: CHEST  2 VIEW COMPARISON:  February 23, 2014 FINDINGS: There is mild left base atelectasis. The lungs elsewhere are clear. Heart size and pulmonary vascularity are normal. No adenopathy. There is degenerative change in each shoulder and in the thoracic spine. There is mid thoracic dextroscoliosis. IMPRESSION: Mild left base atelectasis. Lungs elsewhere clear. Cardiac silhouette within normal limits. Electronically Signed   By: Lowella Grip III M.D.   On: 05/02/2016 17:33    Procedures Procedures (including critical care time)  Medications Ordered in ED Medications  rosuvastatin (CRESTOR) tablet 5 mg (5 mg Oral Not Given 05/02/16 2245)  amLODipine (NORVASC) tablet 10 mg (not administered)  allopurinol (ZYLOPRIM) tablet 100 mg (100 mg Oral Not Given 05/02/16 2245)    acetaminophen (TYLENOL) tablet 1,000 mg (not administered)  aspirin EC tablet 81 mg (not administered)  hydrALAZINE (APRESOLINE) tablet 100 mg (not administered)  isosorbide mononitrate (IMDUR) 24 hr tablet 30 mg (not administered)  enoxaparin (LOVENOX) injection 40 mg (40 mg Subcutaneous Given 05/02/16 2242)  sodium chloride flush (NS) 0.9 % injection 3 mL (3 mLs Intravenous Given 05/02/16  2247)  acetaminophen (TYLENOL) tablet 650 mg (650 mg Oral Given 05/02/16 1729)  sodium chloride 0.9 % bolus 1,000 mL (0 mLs Intravenous Stopped 05/02/16 2047)     Initial Impression / Assessment and Plan / ED Course  I have reviewed the triage vital signs and the nursing notes.  Pertinent labs & imaging results that were available during my care of the patient were reviewed by me and considered in my medical decision making (see chart for details).  Clinical Course   NAJEE MANNINEN is a 81 y.o. male with h/o CAD, HFpEF, SVT (for which he was started on coreg a couple months ago), who presents to ED for generalized weakness/fatigue, noted to have HR upper 40's-lower 50's, BP 100/60's. Two days ago by home health nurse checked BP 102/52, HR 52, advised to drink more water, noted to have hot apartment by nurse and son. Today apartment warmer, pt slightly slower than usual, EMS fround initial BP 90/50 and HR 50, given 500 cc NS en route. Pt reports feeling hot, fatigued, dehydrated, and not himself. No focal deficits. On trauma/falls. No fevers. Concern for dehydration, possible heat exhaustion, possibly medication side-effects. No improvement after additional 1L NS in Ed. As pt is too fatigued to walk, admitted for further management.   Pt condition, course, and admission were discussed with attending physician Dr. Elnora Morrison.    Final Clinical Impressions(s) / ED Diagnoses   Final diagnoses:  Bradycardia  Other fatigue  Hypotension, unspecified hypotension type    New Prescriptions Current Discharge  Medication List       Paralee Cancel, MD 05/02/16 9024    Elnora Morrison, MD 05/04/16 [phone]

## 2016-05-03 ENCOUNTER — Encounter (HOSPITAL_COMMUNITY): Payer: Self-pay | Admitting: General Practice

## 2016-05-03 DIAGNOSIS — M1A9XX0 Chronic gout, unspecified, without tophus (tophi): Secondary | ICD-10-CM

## 2016-05-03 DIAGNOSIS — I10 Essential (primary) hypertension: Secondary | ICD-10-CM

## 2016-05-03 DIAGNOSIS — I130 Hypertensive heart and chronic kidney disease with heart failure and stage 1 through stage 4 chronic kidney disease, or unspecified chronic kidney disease: Secondary | ICD-10-CM | POA: Diagnosis not present

## 2016-05-03 DIAGNOSIS — I952 Hypotension due to drugs: Secondary | ICD-10-CM

## 2016-05-03 DIAGNOSIS — R001 Bradycardia, unspecified: Secondary | ICD-10-CM | POA: Diagnosis not present

## 2016-05-03 DIAGNOSIS — I959 Hypotension, unspecified: Secondary | ICD-10-CM | POA: Diagnosis not present

## 2016-05-03 DIAGNOSIS — N183 Chronic kidney disease, stage 3 (moderate): Secondary | ICD-10-CM | POA: Diagnosis not present

## 2016-05-03 DIAGNOSIS — I2510 Atherosclerotic heart disease of native coronary artery without angina pectoris: Secondary | ICD-10-CM | POA: Diagnosis not present

## 2016-05-03 DIAGNOSIS — R0602 Shortness of breath: Secondary | ICD-10-CM | POA: Diagnosis not present

## 2016-05-03 LAB — BASIC METABOLIC PANEL
Anion gap: 10 (ref 5–15)
BUN: 11 mg/dL (ref 6–20)
CO2: 18 mmol/L — ABNORMAL LOW (ref 22–32)
Calcium: 8.4 mg/dL — ABNORMAL LOW (ref 8.9–10.3)
Chloride: 112 mmol/L — ABNORMAL HIGH (ref 101–111)
Creatinine, Ser: 1.33 mg/dL — ABNORMAL HIGH (ref 0.61–1.24)
GFR calc Af Amer: 57 mL/min — ABNORMAL LOW (ref >60)
GFR calc non Af Amer: 49 mL/min — ABNORMAL LOW (ref >60)
Glucose, Bld: 84 mg/dL (ref 65–99)
Potassium: 3.7 mmol/L (ref 3.5–5.1)
Sodium: 140 mmol/L (ref 135–145)

## 2016-05-03 LAB — URINALYSIS, ROUTINE W REFLEX MICROSCOPIC
Bilirubin Urine: NEGATIVE
Glucose, UA: NEGATIVE mg/dL
Hgb urine dipstick: NEGATIVE
Ketones, ur: NEGATIVE mg/dL
Leukocytes, UA: NEGATIVE
Nitrite: NEGATIVE
Protein, ur: NEGATIVE mg/dL
Specific Gravity, Urine: 1.015 (ref 1.005–1.030)
pH: 7 (ref 5.0–8.0)

## 2016-05-03 LAB — CBC
HCT: 45 % (ref 39.0–52.0)
Hemoglobin: 14.4 g/dL (ref 13.0–17.0)
MCH: 26.4 pg (ref 26.0–34.0)
MCHC: 32 g/dL (ref 30.0–36.0)
MCV: 82.6 fL (ref 78.0–100.0)
Platelets: 171 10*3/uL (ref 150–400)
RBC: 5.45 MIL/uL (ref 4.22–5.81)
RDW: 15.5 % (ref 11.5–15.5)
WBC: 4.3 10*3/uL (ref 4.0–10.5)

## 2016-05-03 LAB — TSH: TSH: 1.099 u[IU]/mL (ref 0.350–4.500)

## 2016-05-03 MED ORDER — POTASSIUM CHLORIDE CRYS ER 20 MEQ PO TBCR
40.0000 meq | EXTENDED_RELEASE_TABLET | Freq: Once | ORAL | Status: AC
  Administered 2016-05-03: 40 meq via ORAL
  Filled 2016-05-03: qty 2

## 2016-05-03 MED ORDER — CARVEDILOL 6.25 MG PO TABS
6.2500 mg | ORAL_TABLET | Freq: Two times a day (BID) | ORAL | Status: DC
  Administered 2016-05-03 – 2016-05-04 (×2): 6.25 mg via ORAL
  Filled 2016-05-03 (×2): qty 1

## 2016-05-03 MED ORDER — DORZOLAMIDE HCL 2 % OP SOLN
1.0000 [drp] | Freq: Three times a day (TID) | OPHTHALMIC | Status: DC
  Administered 2016-05-03 – 2016-05-04 (×3): 1 [drp] via OPHTHALMIC
  Filled 2016-05-03: qty 10

## 2016-05-03 MED ORDER — LATANOPROST 0.005 % OP SOLN
1.0000 [drp] | Freq: Every day | OPHTHALMIC | Status: DC
  Administered 2016-05-03: 1 [drp] via OPHTHALMIC
  Filled 2016-05-03: qty 2.5

## 2016-05-03 MED ORDER — POTASSIUM CHLORIDE CRYS ER 20 MEQ PO TBCR: 40.0000 meq | EXTENDED_RELEASE_TABLET | Freq: Every day | ORAL | Status: DC

## 2016-05-03 NOTE — Evaluation (Addendum)
Physical Therapy Evaluation Patient Details Name: Trevor Murphy MRN: [id number] DOB: August 28, 1934 Today's Date: 05/03/2016   History of Present Illness  Pt adm with weakness and found to have bradycardia likely due to meds. PMH - HTN, etoh, CAD, chf, lt eye blindness  Clinical Impression  Pt admitted with above diagnosis and presents to PT with functional limitations due to deficits listed below (See PT problem list). Pt needs skilled PT to maximize independence and safety to allow discharge to home. Feel pt will return to baseline mobility relatively quickly. Will follow acutely but don't feel pt will need PT after DC.     Follow Up Recommendations No PT follow up    Equipment Recommendations  None recommended by PT    Recommendations for Other Services       Precautions / Restrictions Precautions Precautions: Fall Restrictions Weight Bearing Restrictions: No      Mobility  Bed Mobility Overal bed mobility: Modified Independent             General bed mobility comments: Incr time  Transfers Overall transfer level: Needs assistance Equipment used: 4-wheeled walker Transfers: Sit to/from Stand Sit to Stand: Supervision         General transfer comment: for safety  Ambulation/Gait Ambulation/Gait assistance: Min guard Ambulation Distance (Feet): 300 Feet Assistive device: 4-wheeled walker Gait Pattern/deviations: Step-through pattern;Decreased stride length;Drifts right/left     General Gait Details: Slightly unsteady initially but no loss of balance. Assist for guidance due to poor vision  Stairs            Wheelchair Mobility    Modified Rankin (Stroke Patients Only)       Balance Overall balance assessment: Needs assistance Sitting-balance support: No upper extremity supported;Feet supported Sitting balance-Leahy Scale: Good     Standing balance support: No upper extremity supported Standing balance-Leahy Scale: Fair                                Pertinent Vitals/Pain Pain Assessment: No/denies pain    Home Living Family/patient expects to be discharged to:: Private residence Living Arrangements: Alone Available Help at Discharge: Family;Available PRN/intermittently Type of Home: Apartment Home Access: Level entry     Home Layout: One level Home Equipment: Walker - 4 wheels      Prior Function Level of Independence: Needs assistance   Gait / Transfers Assistance Needed: modified indep with rollator           Hand Dominance   Dominant Hand: Right    Extremity/Trunk Assessment   Upper Extremity Assessment: Defer to OT evaluation           Lower Extremity Assessment: Generalized weakness         Communication   Communication: No difficulties  Cognition Arousal/Alertness: Awake/alert Behavior During Therapy: WFL for tasks assessed/performed Overall Cognitive Status: Within Functional Limits for tasks assessed                      General Comments      Exercises     Assessment/Plan    PT Assessment Patient needs continued PT services  PT Problem List Decreased strength;Decreased balance;Decreased mobility          PT Treatment Interventions DME instruction;Gait training;Functional mobility training;Therapeutic activities;Therapeutic exercise;Balance training;Patient/family education    PT Goals (Current goals can be found in the Care Plan section)  Acute Rehab PT Goals Patient Stated Goal: return  home PT Goal Formulation: With patient Time For Goal Achievement: 05/10/16 Potential to Achieve Goals: Good    Frequency Min 3X/week   Barriers to discharge        Co-evaluation               End of Session Equipment Utilized During Treatment: Gait belt Activity Tolerance: Patient tolerated treatment well Patient left: in chair;with call bell/phone within reach;with chair alarm set Nurse Communication: Mobility status    Functional Assessment Tool  Used: clinical judgement Functional Limitation: Mobility: Walking and moving around Mobility: Walking and Moving Around Current Status [phone]): At least 20 percent but less than 40 percent impaired, limited or restricted Mobility: Walking and Moving Around Goal Status [phone]): At least 1 percent but less than 20 percent impaired, limited or restricted    Time: 1516-1530 PT Time Calculation (min) (ACUTE ONLY): 14 min   Charges:   PT Evaluation $PT Eval Moderate Complexity: 1 Procedure     PT G Codes:   PT G-Codes **NOT FOR INPATIENT CLASS** Functional Assessment Tool Used: clinical judgement Functional Limitation: Mobility: Walking and moving around Mobility: Walking and Moving Around Current Status (V7282): At least 20 percent but less than 40 percent impaired, limited or restricted Mobility: Walking and Moving Around Goal Status [phone]): At least 1 percent but less than 20 percent impaired, limited or restricted    [facility] 05/03/2016, 4:20 PM [facility] PT [phone]

## 2016-05-03 NOTE — Patient Instructions (Signed)

## 2016-05-03 NOTE — Progress Notes (Signed)
Patient ID: JAMIAH RECORE, male   DOB: [birth year], 81 y.o.   MRN: [id number]   SUBJECTIVE Patient  presents to office today complaining of elongated, thickened nails. Pain while ambulating in shoes. Patient is unable to trim their own nails.   Allergies  Allergen Reactions  . No Known Allergies     OBJECTIVE General Patient is awake, alert, and oriented x 3 and in no acute distress. Derm Skin is dry and supple bilateral. Negative open lesions or macerations. Remaining integument unremarkable. Nails are tender, long, thickened and dystrophic with subungual debris, consistent with onychomycosis, 1-5 bilateral. No signs of infection noted. Vasc  DP and PT pedal pulses palpable bilaterally. Temperature gradient within normal limits.  Neuro Epicritic and protective threshold sensation diminished bilaterally.  Musculoskeletal Exam No symptomatic pedal deformities noted bilateral. Muscular strength within normal limits.  ASSESSMENT 1. Onychodystrophic nails 1-5 bilateral with hyperkeratosis of nails.  2. Onychomycosis of nail due to dermatophyte bilateral 3. Pain in foot bilateral  PLAN OF CARE 1. Patient evaluated today.  2. Instructed to maintain good pedal hygiene and foot care.  3. Mechanical debridement of nails 1-5 bilaterally performed using a nail nipper. Filed with dremel without incident.  4. Return to clinic in 3 mos.    Edrick Kins, DPM

## 2016-05-03 NOTE — Progress Notes (Signed)
PROGRESS NOTE        PATIENT DETAILS Name: Trevor Murphy Age: 81 y.o. Sex: male Date of Birth: Jul 22, 1935 Admit Date: 05/02/2016 Admitting Physician Etta Quill, DO HCW:CBJSEGB,TDVVO N, MD  Brief Narrative: Patient is a 81 y.o. male history of PSVT, chronic diastolic heart failure, hypertension, gout was admitted for evaluation of generalized weakness. Found to have heart rate in the 40s, and soft blood pressure-thought to be due to multiple antihypertensives/rate limiting agents. No other etiology evident, and admitted for further evaluation and treatment   Subjective: Feels better than yesterday. But not sure if he is back to his baseline.  Assessment/Plan: Principal Problem:  Sinus bradycardia: Seems to have resolved, thought to be secondary to Coreg and clonidine. Check TSH. Resume low-dose Coreg, permanently discontinue clonidine. Follow in telemetry.  Active Problems: Mild hypotension: Resolved: Thought to be secondary to multiple antihypertensives. Medications being adjusted. No evidence of infection, or GI loss. Chest x-ray negative. Will check a UA to complete workup.  Generalized weakness: Likely secondary to above, seems to have more energy today than yesterday. PT evaluation ordered.  History of PSVT: Resume low-dose Coreg, watch for signs of bradycardia.  History of hypertension: Blood pressure soft on admission, he was on multiple antihypertensives prior to this hospitalization. For now we will watch him on Coreg, hydralazine and Imdur. Lasix, amlodipine and clonidine has been discontinued for now.  Chronic diastolic HYW:VPXTGG status stable-continue to hold Lasix and resume when able.  History of CAD: History of nonobstructive CAD by cath in 2011. Without chest pain or shortness of breath. Continue aspirin, Coreg and statin.  Gout: No evidence of flare, continue allopurinol.  DVT Prophylaxis: Prophylactic Lovenox   Code Status: Full  code   Family Communication: None at bedside   Disposition Plan: Remain inpatient-discharge on 9/25 if remains stable.  Antimicrobial agents: None  Procedures: None  CONSULTS:  None  Time spent: 25- minutes-Greater than 50% of this time was spent in counseling, explanation of diagnosis, planning of further management, and coordination of care.  MEDICATIONS: Anti-infectives    None      Scheduled Meds: . allopurinol  100 mg Oral Daily  . aspirin EC  81 mg Oral Daily  . carvedilol  6.25 mg Oral BID WC  . enoxaparin (LOVENOX) injection  40 mg Subcutaneous Q24H  . hydrALAZINE  100 mg Oral TID  . isosorbide mononitrate  30 mg Oral Daily  . rosuvastatin  5 mg Oral Daily  . sodium chloride flush  3 mL Intravenous Q12H   Continuous Infusions:  PRN Meds:.acetaminophen   PHYSICAL EXAM: Vital signs: Vitals:   05/02/16 2115 05/02/16 2248 05/03/16 0354 05/03/16 1124  BP: 135/87 119/73 109/61 137/86  Pulse: 74  (!) 54   Resp: 16 18 16    Temp:  97.7 F (36.5 C) 98.2 F (36.8 C)   TempSrc:  Oral    SpO2: 95% 99% 100%   Weight:  76.4 kg (168 lb 6.4 oz) 75.3 kg (165 lb 14.4 oz)   Height:  5' 10.5" (1.791 m)     Filed Weights   05/02/16 2248 05/03/16 0354  Weight: 76.4 kg (168 lb 6.4 oz) 75.3 kg (165 lb 14.4 oz)   Body mass index is 23.47 kg/m.   General appearance :Awake, alert, not in any distress. Speech Clear. Not toxic Looking Eyes:, pupils equally  reactive to light and accomodation,no scleral icterus.Pink conjunctiva HEENT: Atraumatic and Normocephalic Neck: supple, no JVD. No cervical lymphadenopathy. No thyromegaly Resp:Good air entry bilaterally, no added sounds  CVS: S1 S2 regular, no murmurs.  GI: Bowel sounds present, Non tender and not distended with no gaurding, rigidity or rebound.No organomegaly Extremities: B/L Lower Ext shows no edema, both legs are warm to touch Neurology:  speech clear,Non focal, sensation is grossly intact. Psychiatric: Normal  judgment and insight. Alert and oriented x 3. Normal mood. Musculoskeletal:gait appears to be normal.No digital cyanosis Skin:No Rash, warm and dry Wounds:N/A  I have personally reviewed following labs and imaging studies  LABORATORY DATA: CBC:  Recent Labs Lab 05/02/16 1605 05/03/16 0435  WBC 4.6 4.3  HGB 13.9 14.4  HCT 43.1 45.0  MCV 82.4 82.6  PLT 167 161    Basic Metabolic Panel:  Recent Labs Lab 05/02/16 1605 05/03/16 0435  NA 136 140  K 3.5 3.7  CL 113* 112*  CO2 17* 18*  GLUCOSE 141* 84  BUN 12 11  CREATININE 1.50* 1.33*  CALCIUM 8.1* 8.4*  MG 2.0  --     GFR: Estimated Creatinine Clearance: 47.3 mL/min (by C-G formula based on SCr of 1.33 mg/dL (H)).  Liver Function Tests: No results for input(s): AST, ALT, ALKPHOS, BILITOT, PROT, ALBUMIN in the last 168 hours. No results for input(s): LIPASE, AMYLASE in the last 168 hours. No results for input(s): AMMONIA in the last 168 hours.  Coagulation Profile: No results for input(s): INR, PROTIME in the last 168 hours.  Cardiac Enzymes: No results for input(s): CKTOTAL, CKMB, CKMBINDEX, TROPONINI in the last 168 hours.  BNP (last 3 results) No results for input(s): PROBNP in the last 8760 hours.  HbA1C: No results for input(s): HGBA1C in the last 72 hours.  CBG: No results for input(s): GLUCAP in the last 168 hours.  Lipid Profile: No results for input(s): CHOL, HDL, LDLCALC, TRIG, CHOLHDL, LDLDIRECT in the last 72 hours.  Thyroid Function Tests: No results for input(s): TSH, T4TOTAL, FREET4, T3FREE, THYROIDAB in the last 72 hours.  Anemia Panel: No results for input(s): VITAMINB12, FOLATE, FERRITIN, TIBC, IRON, RETICCTPCT in the last 72 hours.  Urine analysis:    Component Value Date/Time   COLORURINE YELLOW 07/08/2014 [place] 07/08/2014 0333   LABSPEC 1.010 07/08/2014 0333   PHURINE 5.5 07/08/2014 0333   GLUCOSEU NEGATIVE 07/08/2014 0333   HGBUR NEGATIVE 07/08/2014 0333     BILIRUBINUR NEGATIVE 07/08/2014 0333   KETONESUR NEGATIVE 07/08/2014 0333   PROTEINUR NEGATIVE 07/08/2014 0333   UROBILINOGEN 0.2 07/08/2014 0333   NITRITE NEGATIVE 07/08/2014 0333   LEUKOCYTESUR NEGATIVE 07/08/2014 0333    Sepsis Labs: Lactic Acid, Venous No results found for: LATICACIDVEN  MICROBIOLOGY: No results found for this or any previous visit (from the past 240 hour(s)).  RADIOLOGY STUDIES/RESULTS: Dg Chest 2 View  Result Date: 05/02/2016 CLINICAL DATA:  Shortness of breath.  Hypertension. EXAM: CHEST  2 VIEW COMPARISON:  February 23, 2014 FINDINGS: There is mild left base atelectasis. The lungs elsewhere are clear. Heart size and pulmonary vascularity are normal. No adenopathy. There is degenerative change in each shoulder and in the thoracic spine. There is mid thoracic dextroscoliosis. IMPRESSION: Mild left base atelectasis. Lungs elsewhere clear. Cardiac silhouette within normal limits. Electronically Signed   By: Lowella Grip III M.D.   On: 05/02/2016 17:33     LOS: 0 days   Oren Binet, MD  [facility] Pager:[phone]  If 7PM-7AM, please contact night-coverage [email or web address] Password [facility] 05/03/2016, 11:27 AM

## 2016-05-04 DIAGNOSIS — R001 Bradycardia, unspecified: Secondary | ICD-10-CM | POA: Diagnosis not present

## 2016-05-04 DIAGNOSIS — I10 Essential (primary) hypertension: Secondary | ICD-10-CM | POA: Diagnosis not present

## 2016-05-04 DIAGNOSIS — M1A9XX0 Chronic gout, unspecified, without tophus (tophi): Secondary | ICD-10-CM | POA: Diagnosis not present

## 2016-05-04 DIAGNOSIS — I959 Hypotension, unspecified: Secondary | ICD-10-CM | POA: Diagnosis not present

## 2016-05-04 DIAGNOSIS — I952 Hypotension due to drugs: Secondary | ICD-10-CM | POA: Diagnosis not present

## 2016-05-04 MED ORDER — CARVEDILOL 6.25 MG PO TABS: 6.2500 mg | ORAL_TABLET | Freq: Two times a day (BID) | ORAL | 0 refills | Status: DC

## 2016-05-04 NOTE — Discharge Summary (Signed)
PATIENT DETAILS Name: Trevor Murphy Age: 81 y.o. Sex: male Date of Birth: 15-Sep-1934 MRN: [id number]. Admitting Physician: Etta Quill, DO FFM:[id number],DJTTS N, MD  Admit Date: 05/02/2016 Discharge date: 05/04/2016  Recommendations for Outpatient Follow-up:  1. Follow up with PCP in 1-2 weeks 2. Please obtain BMP/CBC in one week 3. Please ensure follow-up with cardiology 4. Medication dosage has been adjusted, see medication list below  Admitted From:  [facility]  Disposition: [place]: No  Equipment/Devices: None  Discharge Condition: Stable  CODE STATUS: FULL CODE  Diet recommendation:  Heart Healthy  Brief Summary: See H&P, Labs, Consult and Test reports for all details in brief, patientPatient is a 81 y.o. male history of PSVT, chronic diastolic heart failure, hypertension, gout was admitted for evaluation of generalized weakness. Found to have heart rate in the 40s, and soft blood pressure-thought to be due to multiple antihypertensives/rate limiting agents. No other etiology evident, and admitted for further evaluation and treatment  Brief Hospital Course: Sinus bradycardia: Seems to have resolved, thought to be secondary to Coreg and clonidine. TSH within normal limits, heart rate stable with low-dose Coreg.  Active Problems: Mild hypotension: Resolved.Thought to be secondary to multiple antihypertensives. Amlodipine, clonidine discontinued, Coreg dosage decreased to 6.25 mg. Blood pressurestable with these changes. No evidence of infection, or GI loss.   Generalized weakness: Likely secondary to above, seems to have more energy today than yesterday. PT/OT evaluation completed, no further recommendations.   History of PSVT: Stable on telemetry on low-dose Coreg.   History of hypertension: Blood pressure soft on admission, he was on multiple antihypertensives prior to this hospitalization. Blood pressure stable on Coreg, hydralazine and Imdur.  Amlodipine and clonidine has been discontinued for now.  Chronic diastolic VXB:LTJQZE status stable  History of CAD: History of nonobstructive CAD by cath in 2011. Without chest pain or shortness of breath. Continue aspirin, Coreg and statin.  Gout: No evidence of flare, continue allopurinol.  Procedures/Studies: None  Discharge Diagnoses:  Principal Problem:   Sinus bradycardia Active Problems:   Essential hypertension   CKD (chronic kidney disease) stage 3, GFR 30-59 ml/min   Gout   Hypotension due to drugs   History of tachycardia   Discharge Instructions:  Activity:  As tolerated with Full fall precautions use walker/cane & assistance as needed  Discharge Instructions    Call MD for:  persistant nausea and vomiting    Complete by:  As directed    Call MD for:  severe uncontrolled pain    Complete by:  As directed    Diet - low sodium heart healthy    Complete by:  As directed    Discharge instructions    Complete by:  As directed    Follow with Primary MD  Maximino Greenland, MD  and other consultant's as instructed your Hospitalist MD  Please get a complete blood count and chemistry panel checked by your Primary MD at your next visit, and again as instructed by your Primary MD.  Get Medicines reviewed and adjusted: Please take all your medications with you for your next visit with your Primary MD  Laboratory/radiological data: Please request your Primary MD to go over all hospital tests and procedure/radiological results at the follow up, please ask your Primary MD to get all Hospital records sent to his/her office.  In some cases, they will be blood work, cultures and biopsy results pending at the time of your discharge. Please request that your primary  care M.D. follows up on these results.  Also Note the following: If you experience worsening of your admission symptoms, develop shortness of breath, life threatening emergency, suicidal or homicidal thoughts  you must seek medical attention immediately by calling 911 or calling your MD immediately  if symptoms less severe.  You must read complete instructions/literature along with all the possible adverse reactions/side effects for all the Medicines you take and that have been prescribed to you. Take any new Medicines after you have completely understood and accpet all the possible adverse reactions/side effects.   Do not drive when taking Pain medications or sleeping medications (Benzodaizepines)  Do not take more than prescribed Pain, Sleep and Anxiety Medications. It is not advisable to combine anxiety,sleep and pain medications without talking with your primary care practitioner  Special Instructions: If you have smoked or chewed Tobacco  in the last 2 yrs please stop smoking, stop any regular Alcohol  and or any Recreational drug use.  Wear Seat belts while driving.  Please note: You were cared for by a hospitalist during your hospital stay. Once you are discharged, your primary care physician will handle any further medical issues. Please note that NO REFILLS for any discharge medications will be authorized once you are discharged, as it is imperative that you return to your primary care physician (or establish a relationship with a primary care physician if you do not have one) for your post hospital discharge needs so that they can reassess your need for medications and monitor your lab values.   Increase activity slowly    Complete by:  As directed        Medication List    STOP taking these medications   amLODipine 10 MG tablet Commonly known as:  NORVASC   cloNIDine 0.1 MG tablet Commonly known as:  CATAPRES     TAKE these medications   acetaminophen 500 MG tablet Commonly known as:  TYLENOL Take 1,000 mg by mouth every 6 (six) hours as needed for headache.   allopurinol 100 MG tablet Commonly known as:  ZYLOPRIM Take 100 mg by mouth daily.   aspirin EC 81 MG tablet Take  1 tablet (81 mg total) by mouth daily.   carvedilol 6.25 MG tablet Commonly known as:  COREG Take 1 tablet (6.25 mg total) by mouth 2 (two) times daily with a meal. What changed:  medication strength  how much to take  when to take this   dorzolamide 2 % ophthalmic solution Commonly known as:  TRUSOPT Place 1 drop into both eyes 3 (three) times daily.   furosemide 20 MG tablet Commonly known as:  LASIX Take 20 mg by mouth daily.   hydrALAZINE 100 MG tablet Commonly known as:  APRESOLINE Take 100 mg by mouth 3 (three) times daily.   isosorbide mononitrate 30 MG 24 hr tablet Commonly known as:  IMDUR Take 30 mg by mouth daily.   nitroGLYCERIN 0.4 MG SL tablet Commonly known as:  NITROSTAT Place 0.4 mg under the tongue every 5 (five) minutes as needed for chest pain. X 3 doses   potassium chloride 10 MEQ tablet Commonly known as:  K-DUR Take 10 mEq by mouth every other day. Every other day with lasix   rosuvastatin 5 MG tablet Commonly known as:  CRESTOR Take 1 tablet (5 mg total) by mouth daily.   Travoprost (BAK Free) 0.004 % Soln ophthalmic solution Commonly known as:  TRAVATAN Place 1 drop into both eyes at bedtime.  Follow-up Information    Maximino Greenland, MD .   Specialty:  Internal Medicine Contact information: [address] [place] [address] [phone]          Allergies  Allergen Reactions  . No Known Allergies     Consultations:   None  Other Procedures/Studies: Dg Chest 2 View  Result Date: 05/02/2016 CLINICAL DATA:  Shortness of breath.  Hypertension. EXAM: CHEST  2 VIEW COMPARISON:  February 23, 2014 FINDINGS: There is mild left base atelectasis. The lungs elsewhere are clear. Heart size and pulmonary vascularity are normal. No adenopathy. There is degenerative change in each shoulder and in the thoracic spine. There is mid thoracic dextroscoliosis. IMPRESSION: Mild left base atelectasis. Lungs elsewhere clear.  Cardiac silhouette within normal limits. Electronically Signed   By: Lowella Grip III M.D.   On: 05/02/2016 17:33      TODAY-DAY OF DISCHARGE:  Subjective:   Trevor Murphy today has no headache,no chest abdominal pain,no new weakness tingling or numbness, feels much better wants to go home today.  Objective:   Blood pressure 122/72, pulse 79, temperature 98.6 F (37 C), temperature source Oral, resp. rate 18, height 5' 10.5" (1.791 m), weight 74.6 kg (164 lb 6.4 oz), SpO2 98 %.  Intake/Output Summary (Last 24 hours) at 05/04/16 1031 Last data filed at 05/04/16 0845  Gross per 24 hour  Intake              240 ml  Output              700 ml  Net             -460 ml   Filed Weights   05/02/16 2248 05/03/16 0354 05/04/16 0452  Weight: 76.4 kg (168 lb 6.4 oz) 75.3 kg (165 lb 14.4 oz) 74.6 kg (164 lb 6.4 oz)    Exam: Awake Alert, Oriented *3, No new F.N deficits, Normal affect [place].AT,PERRAL Supple Neck,No JVD, No cervical lymphadenopathy appriciated.  Symmetrical Chest wall movement, Good air movement bilaterally, CTAB RRR,No Gallops,Rubs or new Murmurs, No Parasternal Heave +ve B.Sounds, Abd Soft, Non tender, No organomegaly appriciated, No rebound -guarding or rigidity. No Cyanosis, Clubbing or edema, No new Rash or bruise   PERTINENT RADIOLOGIC STUDIES: Dg Chest 2 View  Result Date: 05/02/2016 CLINICAL DATA:  Shortness of breath.  Hypertension. EXAM: CHEST  2 VIEW COMPARISON:  February 23, 2014 FINDINGS: There is mild left base atelectasis. The lungs elsewhere are clear. Heart size and pulmonary vascularity are normal. No adenopathy. There is degenerative change in each shoulder and in the thoracic spine. There is mid thoracic dextroscoliosis. IMPRESSION: Mild left base atelectasis. Lungs elsewhere clear. Cardiac silhouette within normal limits. Electronically Signed   By: Lowella Grip III M.D.   On: 05/02/2016 17:33     PERTINENT LAB RESULTS: CBC:  Recent Labs   05/02/16 1605 05/03/16 0435  WBC 4.6 4.3  HGB 13.9 14.4  HCT 43.1 45.0  PLT 167 171   CMET CMP     Component Value Date/Time   NA 140 05/03/2016 0435   K 3.7 05/03/2016 0435   CL 112 (H) 05/03/2016 0435   CO2 18 (L) 05/03/2016 0435   GLUCOSE 84 05/03/2016 0435   BUN 11 05/03/2016 0435   CREATININE 1.33 (H) 05/03/2016 0435   CALCIUM 8.4 (L) 05/03/2016 0435   PROT 7.8 04/14/2016 0956   ALBUMIN 3.9 04/14/2016 0956   AST 24 04/14/2016 0956   ALT 20 04/14/2016 0956  ALKPHOS 96 04/14/2016 0956   BILITOT 1.1 04/14/2016 0956   GFRNONAA 49 (L) 05/03/2016 0435   GFRAA 57 (L) 05/03/2016 0435    GFR Estimated Creatinine Clearance: 47.3 mL/min (by C-G formula based on SCr of 1.33 mg/dL (H)). No results for input(s): LIPASE, AMYLASE in the last 72 hours. No results for input(s): CKTOTAL, CKMB, CKMBINDEX, TROPONINI in the last 72 hours. Invalid input(s): POCBNP No results for input(s): DDIMER in the last 72 hours. No results for input(s): HGBA1C in the last 72 hours. No results for input(s): CHOL, HDL, LDLCALC, TRIG, CHOLHDL, LDLDIRECT in the last 72 hours.  Recent Labs  05/03/16 1045  TSH 1.099   No results for input(s): VITAMINB12, FOLATE, FERRITIN, TIBC, IRON, RETICCTPCT in the last 72 hours. Coags: No results for input(s): INR in the last 72 hours.  Invalid input(s): PT Microbiology: No results found for this or any previous visit (from the past 240 hour(s)).  FURTHER DISCHARGE INSTRUCTIONS:  Get Medicines reviewed and adjusted: Please take all your medications with you for your next visit with your Primary MD  Laboratory/radiological data: Please request your Primary MD to go over all hospital tests and procedure/radiological results at the follow up, please ask your Primary MD to get all Hospital records sent to his/her office.  In some cases, they will be blood work, cultures and biopsy results pending at the time of your discharge. Please request that your  primary care M.D. goes through all the records of your hospital data and follows up on these results.  Also Note the following: If you experience worsening of your admission symptoms, develop shortness of breath, life threatening emergency, suicidal or homicidal thoughts you must seek medical attention immediately by calling 911 or calling your MD immediately  if symptoms less severe.  You must read complete instructions/literature along with all the possible adverse reactions/side effects for all the Medicines you take and that have been prescribed to you. Take any new Medicines after you have completely understood and accpet all the possible adverse reactions/side effects.   Do not drive when taking Pain medications or sleeping medications (Benzodaizepines)  Do not take more than prescribed Pain, Sleep and Anxiety Medications. It is not advisable to combine anxiety,sleep and pain medications without talking with your primary care practitioner  Special Instructions: If you have smoked or chewed Tobacco  in the last 2 yrs please stop smoking, stop any regular Alcohol  and or any Recreational drug use.  Wear Seat belts while driving.  Please note: You were cared for by a hospitalist during your hospital stay. Once you are discharged, your primary care physician will handle any further medical issues. Please note that NO REFILLS for any discharge medications will be authorized once you are discharged, as it is imperative that you return to your primary care physician (or establish a relationship with a primary care physician if you do not have one) for your post hospital discharge needs so that they can reassess your need for medications and monitor your lab values.  Total Time spent coordinating discharge including counseling, education and face to face time equals 25 minutes.  SignedOren Binet 05/04/2016 10:31 AM

## 2016-05-04 NOTE — Care Management Note (Addendum)
Case Management Note  Patient Details  Name: Trevor Murphy MRN: [id number] Date of Birth: 08-Jun-1935  Subjective/Objective: Pt presented for Bradycardia. Pt is from home with his sister and son. Both work, however both are home at night.                    Action/Plan: Referral Received for [facility] Needs: Pt states he has a Therapist, sports that visits from [facility] [employer]) that assists him with medications. Per pt he has [employer] to deliver medications. [facility] RN assists with filling his medications in his pill box. Pt states he ambulates fine and gets to MD appointments.  No needs identified at this time.   Expected Discharge Date:  05/03/16               Expected Discharge Plan:  Home/Self Care  In-House Referral:  NA  Discharge planning Services  CM Consult  Post Acute Care Choice:  NA Choice offered to:  NA  DME Arranged:  N/A DME Agency:  NA  HH Arranged:  NA HH Agency:  NA  Status of Service:  Completed, signed off  If discussed at [place] of Stay Meetings, dates discussed:    Additional Comments:  Bethena Roys, RN 05/04/2016, 11:25 AM

## 2016-05-04 NOTE — Evaluation (Signed)
Occupational Therapy Evaluation Patient Details Name: Trevor Murphy MRN: [id number] DOB: 31-May-1935 Today's Date: 05/04/2016    History of Present Illness Pt adm with weakness and found to have bradycardia likely due to meds. PMH - HTN, etoh, CAD, chf, lt eye blindness   Clinical Impression   Pt. Was able to perform bed mobility at Mod I level. Pt. Was Mod I to AMB in room with walker. Pt. Was Mod I with toilet transfer at Mod I level. Pt. Did not have any LOB with activity. No further OT recommended at this time.     Follow Up Recommendations  No OT follow up    Equipment Recommendations  None recommended by OT    Recommendations for Other Services       Precautions / Restrictions Precautions Precautions: Fall Restrictions Weight Bearing Restrictions: No      Mobility Bed Mobility Overal bed mobility: Modified Independent                Transfers   Equipment used: Rolling walker (2 wheeled) Transfers: Sit to/from Stand Sit to Stand: Modified independent (Device/Increase time)              Balance                                            ADL Overall ADL's : At baseline                                       General ADL Comments:  (Pt. was able to perform ADL tasks, transfers and AMB Mod I)     Vision Additional Comments: Pt. R eye vision is better than left. Pt. can read with magnifer and light.    Perception     Praxis      Pertinent Vitals/Pain Pain Assessment: No/denies pain     Hand Dominance Right   Extremity/Trunk Assessment Upper Extremity Assessment Upper Extremity Assessment: Overall WFL for tasks assessed           Communication Communication Communication: No difficulties   Cognition Arousal/Alertness: Awake/alert Behavior During Therapy: WFL for tasks assessed/performed Overall Cognitive Status: Within Functional Limits for tasks assessed                     General  Comments       Exercises       Shoulder Instructions      Home Living Family/patient expects to be discharged to:: Private residence Living Arrangements: Alone Available Help at Discharge: Family;Available PRN/intermittently Type of Home: Apartment Home Access: Level entry     Home Layout: One level     Bathroom Shower/Tub: Tub/shower unit Shower/tub characteristics: Architectural technologist: Standard Bathroom Accessibility: Yes How Accessible: Accessible via walker Home Equipment: [place] - 4 wheels;Bedside commode;Shower seat;Grab bars - toilet;Grab bars - tub/shower          Prior Functioning/Environment Level of Independence: Independent with assistive device(s)  Gait / Transfers Assistance Needed: modified indep with rollator     Comments:  (Pt. had family prepare meals that he would microwave.)        OT Problem List:     OT Treatment/Interventions:      OT Goals(Current goals can be found in the care plan section) Acute  Rehab OT Goals Patient Stated Goal: go home  OT Frequency:     Barriers to D/C:            Co-evaluation              End of Session Equipment Utilized During Treatment: Gait belt;Rolling walker  Activity Tolerance: Patient tolerated treatment well Patient left: in bed;with call bell/phone within reach;with bed alarm set   Time: 0825-0911 OT Time Calculation (min): 46 min Charges:  OT General Charges $OT Visit: 1 Procedure OT Evaluation $OT Eval Low Complexity: 1 Procedure OT Treatments $Self Care/Home Management : 23-37 mins G-Codes: OT G-codes **NOT FOR INPATIENT CLASS** Functional Limitation: Self care Self Care Current Status (T0626): At least 1 percent but less than 20 percent impaired, limited or restricted Self Care Goal Status (R4854): At least 1 percent but less than 20 percent impaired, limited or restricted Self Care Discharge Status [phone]): At least 1 percent but less than 20 percent impaired, limited or  restricted  Kara Melching 05/04/2016, 9:12 AM

## 2016-05-04 NOTE — Care Management Obs Status (Signed)
[facility] NOTIFICATION   Patient Details  Name: Trevor Murphy MRN: [id number] Date of Birth: 09/08/1934   Medicare Observation Status Notification Given:  Yes    Bethena Roys, RN 05/04/2016, 12:08 PM

## 2016-05-04 NOTE — Progress Notes (Signed)
[facility]  Patient Status: Active (receiving services up to time of hospitalization)  [facility] is providing the following services: RN and PT  If patient discharges after hours, please call [phone].   Janae Sauce 05/04/2016, 11:36 AM

## 2016-05-05 ENCOUNTER — Institutional Professional Consult (permissible substitution): Payer: Self-pay | Admitting: Internal Medicine

## 2016-05-05 DIAGNOSIS — I5032 Chronic diastolic (congestive) heart failure: Secondary | ICD-10-CM | POA: Diagnosis not present

## 2016-05-05 DIAGNOSIS — Z8673 Personal history of transient ischemic attack (TIA), and cerebral infarction without residual deficits: Secondary | ICD-10-CM | POA: Diagnosis not present

## 2016-05-05 DIAGNOSIS — E785 Hyperlipidemia, unspecified: Secondary | ICD-10-CM | POA: Diagnosis not present

## 2016-05-05 DIAGNOSIS — I129 Hypertensive chronic kidney disease with stage 1 through stage 4 chronic kidney disease, or unspecified chronic kidney disease: Secondary | ICD-10-CM | POA: Diagnosis not present

## 2016-05-05 DIAGNOSIS — K219 Gastro-esophageal reflux disease without esophagitis: Secondary | ICD-10-CM | POA: Diagnosis not present

## 2016-05-05 DIAGNOSIS — N183 Chronic kidney disease, stage 3 (moderate): Secondary | ICD-10-CM | POA: Diagnosis not present

## 2016-05-05 DIAGNOSIS — I2510 Atherosclerotic heart disease of native coronary artery without angina pectoris: Secondary | ICD-10-CM | POA: Diagnosis not present

## 2016-05-06 ENCOUNTER — Telehealth: Payer: Self-pay | Admitting: Cardiology

## 2016-05-06 DIAGNOSIS — Z09 Encounter for follow-up examination after completed treatment for conditions other than malignant neoplasm: Secondary | ICD-10-CM | POA: Diagnosis not present

## 2016-05-06 DIAGNOSIS — R000 Tachycardia, unspecified: Secondary | ICD-10-CM | POA: Diagnosis not present

## 2016-05-06 DIAGNOSIS — R001 Bradycardia, unspecified: Secondary | ICD-10-CM | POA: Diagnosis not present

## 2016-05-06 NOTE — Telephone Encounter (Signed)
LMTCB for [employer]

## 2016-05-06 NOTE — Telephone Encounter (Signed)
Closed for lunch

## 2016-05-06 NOTE — Telephone Encounter (Signed)
New message     Office calling wants to know what's  the next step for patient due recent  hospital stay  9.23 to 9.25 for bradycardia.    appt was cancel on yesterday with Dr. Lovena Le

## 2016-05-09 ENCOUNTER — Emergency Department (HOSPITAL_COMMUNITY): Payer: Medicare Other

## 2016-05-09 ENCOUNTER — Emergency Department (HOSPITAL_COMMUNITY)
Admission: EM | Admit: 2016-05-09 | Discharge: 2016-05-09 | Disposition: A | Discharge status: Home / Self Care | Payer: Medicare Other | Attending: Emergency Medicine | Admitting: Emergency Medicine

## 2016-05-09 ENCOUNTER — Encounter (HOSPITAL_COMMUNITY): Payer: Self-pay | Admitting: Emergency Medicine

## 2016-05-09 DIAGNOSIS — I130 Hypertensive heart and chronic kidney disease with heart failure and stage 1 through stage 4 chronic kidney disease, or unspecified chronic kidney disease: Secondary | ICD-10-CM | POA: Diagnosis not present

## 2016-05-09 DIAGNOSIS — R002 Palpitations: Secondary | ICD-10-CM | POA: Diagnosis not present

## 2016-05-09 DIAGNOSIS — Z7982 Long term (current) use of aspirin: Secondary | ICD-10-CM | POA: Insufficient documentation

## 2016-05-09 DIAGNOSIS — R519 Headache, unspecified: Secondary | ICD-10-CM

## 2016-05-09 DIAGNOSIS — N183 Chronic kidney disease, stage 3 (moderate): Secondary | ICD-10-CM | POA: Insufficient documentation

## 2016-05-09 DIAGNOSIS — R51 Headache: Secondary | ICD-10-CM | POA: Diagnosis not present

## 2016-05-09 DIAGNOSIS — R5383 Other fatigue: Secondary | ICD-10-CM | POA: Diagnosis not present

## 2016-05-09 DIAGNOSIS — Z955 Presence of coronary angioplasty implant and graft: Secondary | ICD-10-CM | POA: Diagnosis not present

## 2016-05-09 DIAGNOSIS — I2510 Atherosclerotic heart disease of native coronary artery without angina pectoris: Secondary | ICD-10-CM | POA: Insufficient documentation

## 2016-05-09 DIAGNOSIS — Z8673 Personal history of transient ischemic attack (TIA), and cerebral infarction without residual deficits: Secondary | ICD-10-CM | POA: Diagnosis not present

## 2016-05-09 DIAGNOSIS — I5032 Chronic diastolic (congestive) heart failure: Secondary | ICD-10-CM | POA: Insufficient documentation

## 2016-05-09 LAB — CBC
HCT: 51 % (ref 39.0–52.0)
Hemoglobin: 17.2 g/dL — ABNORMAL HIGH (ref 13.0–17.0)
MCH: 27.9 pg (ref 26.0–34.0)
MCHC: 33.7 g/dL (ref 30.0–36.0)
MCV: 82.8 fL (ref 78.0–100.0)
Platelets: 208 10*3/uL (ref 150–400)
RBC: 6.16 MIL/uL — ABNORMAL HIGH (ref 4.22–5.81)
RDW: 15.4 % (ref 11.5–15.5)
WBC: 6.1 10*3/uL (ref 4.0–10.5)

## 2016-05-09 LAB — BASIC METABOLIC PANEL
Anion gap: 8 (ref 5–15)
BUN: 15 mg/dL (ref 6–20)
CO2: 21 mmol/L — ABNORMAL LOW (ref 22–32)
Calcium: 8.9 mg/dL (ref 8.9–10.3)
Chloride: 109 mmol/L (ref 101–111)
Creatinine, Ser: 1.67 mg/dL — ABNORMAL HIGH (ref 0.61–1.24)
GFR calc Af Amer: 43 mL/min — ABNORMAL LOW (ref >60)
GFR calc non Af Amer: 37 mL/min — ABNORMAL LOW (ref >60)
Glucose, Bld: 134 mg/dL — ABNORMAL HIGH (ref 65–99)
Potassium: 4.1 mmol/L (ref 3.5–5.1)
Sodium: 138 mmol/L (ref 135–145)

## 2016-05-09 LAB — I-STAT TROPONIN, ED
Troponin i, poc: 0.02 ng/mL (ref 0.00–0.08)
Troponin i, poc: 0.01 ng/mL (ref 0.00–0.08)

## 2016-05-09 LAB — MAGNESIUM: Magnesium: 2.1 mg/dL (ref 1.7–2.4)

## 2016-05-09 MED ORDER — SODIUM CHLORIDE 0.9 % IV BOLUS (SEPSIS)
1000.0000 mL | Freq: Once | INTRAVENOUS | Status: AC
  Administered 2016-05-09: 1000 mL via INTRAVENOUS

## 2016-05-09 MED ORDER — PROCHLORPERAZINE EDISYLATE 5 MG/ML IJ SOLN
10.0000 mg | Freq: Once | INTRAMUSCULAR | Status: AC
  Administered 2016-05-09: 10 mg via INTRAVENOUS
  Filled 2016-05-09: qty 2

## 2016-05-09 MED ORDER — DIPHENHYDRAMINE HCL 50 MG/ML IJ SOLN
12.5000 mg | Freq: Once | INTRAMUSCULAR | Status: AC
  Administered 2016-05-09: 12.5 mg via INTRAVENOUS
  Filled 2016-05-09: qty 1

## 2016-05-09 NOTE — ED Triage Notes (Addendum)
Felt like heart was beating really fast at 0930 today, currently resolved. Denies chest pain now or during episode. Reports during palpitations had onset of a headache and blurry vision; the headache has not resolved since. States he cannot see out of his left eye, but this is not new, he is supposed to have surgery on that eye. Came to ED for the headache. It is a sharp pain over right eye. No extremity numbness or weakness, no facial droop, speech intact.

## 2016-05-09 NOTE — ED Provider Notes (Signed)
[facility] DEPT Provider Note   CSN: [id number] Arrival date & time: 05/09/16  1342     History   Chief Complaint Chief Complaint  Patient presents with  . Headache  . Palpitations    HPI Trevor Murphy is a 81 y.o. male With a past medical history significant for CAD, hypertension, stroke, SVT, CHF, and CAD who presents with headache and recurrent palpitations. Patient reports that he has a long history of palpitations and reports that episode around 930 this morning. He denies associated chest pain, shortness of breath, lightheadedness, or other problems aside from a headache would start then. Patient says his headache has been sharp, dull, and all over his frontal. He describes the pain as a to a seven out of 10 in severity but improved on arrival. He says he has chronic blurry vision which is currently being worked about as often malting team he denies any changes today. Patient denies fevers, chills, neck pain, neck stiffness, nausea, vomiting, constipation, diarrhea, dysuria. Patient denies he recent head trauma or chest trauma. He denies the cough or other respiratory symptoms. He denies DVT, PE, denies any leg pain or leg swelling.   He reports that he has had similar symptoms of headache and Palpitations when he was dehydrated recently.   Headache   This is a recurrent problem. The current episode started 6 to 12 hours ago. The problem occurs constantly. The problem has been gradually improving. The headache is associated with bright light (loud noises). The pain is located in the frontal region. The quality of the pain is described as dull and sharp. The pain is at a severity of 7/10. The pain is moderate. Associated symptoms include palpitations. Pertinent negatives include no anorexia, no fever, no malaise/fatigue, no chest pressure, no near-syncope, no syncope, no shortness of breath, no nausea and no vomiting. He has tried resting in a darkened room for the symptoms. The  treatment provided mild relief.  Palpitations   This is a recurrent problem. The current episode started 6 to 12 hours ago. The problem occurs rarely. The problem has been resolved. Associated symptoms include irregular heartbeat and headaches. Pertinent negatives include no diaphoresis, no fever, no malaise/fatigue, no numbness, no chest pain, no chest pressure, no exertional chest pressure, no near-syncope, no syncope, no abdominal pain, no nausea, no vomiting, no back pain, no lower extremity edema, no dizziness, no weakness, no cough, no hemoptysis and no shortness of breath. His past medical history is significant for heart disease.    Past Medical History:  Diagnosis Date  . Arthritis   . Blindness of left eye   . CAD (coronary artery disease)    a. Cath 08/2009: minimal nonobstructive disease (25% LAD). b. Lexiscan nuc 6/215: EF 54%, no ischemia or infarction.  . Chronic diastolic CHF (congestive heart failure) ([place])    a. Echo 2013: EF 55-60%, mild AI, mild MR. b. Echo 5/15 with EF 60-65%, no WMA, mild LVH, mild AI.  [name] Kitchen CKD (chronic kidney disease) stage 3, GFR 30-59 ml/min 07/26/2012   a. Felt likely due to HTN.  . CVA (cerebral infarction) 07/28/2012  . Glaucoma   . Gout   . H/O umbilical hernia repair   . Headache(784.0)    " WHEN MY BLOOD PRESSURE IS UP"  . History of acute renal failure   . History of alcohol abuse   . HTN (hypertension)   . Hypertension    a. Patent renal arteries 2005.  [name] Kitchen Noncompliance   .  OA (osteoarthritis)   . Other and unspecified hyperlipidemia   . Poor circulation   . SVT (supraventricular tachycardia) ([place])    a. Possible AVNRT.  Got adenosine from EMS in 2015 for SVT.  During 5/15 hospitalization had episode of short R-P tachycardia (possible AVNRT) terminated by adenosine.  b. 04/2016 - recurrent tachycardia (SVT vs. AVNRT), again terminated by Adenosine    Patient Active Problem List   Diagnosis Date Noted  . Sinus bradycardia 05/02/2016  .  Hypotension due to drugs 05/02/2016  . History of tachycardia 05/02/2016  . GERD (gastroesophageal reflux disease) 02/05/2015  . Mild coronary artery disease 04/20/2014  . Atypical chest pain 04/12/2014  . Hypertensive urgency 03/11/2014  . Chronic diastolic CHF (congestive heart failure) ([place]) 01/30/2014  . Alcohol abuse 01/19/2014  . SVT (supraventricular tachycardia) ([place]) 12/19/2013  . Elevated troponin 12/19/2013  . Gout 12/19/2013  . Dyslipidemia 12/16/2013  . Hypertensive crisis 12/16/2013  . CVA (cerebral infarction) 07/28/2012  . Acute on chronic renal failure ([place]) 07/27/2012  . Essential hypertension 07/26/2012  . CKD (chronic kidney disease) stage 3, GFR 30-59 ml/min 07/26/2012  . Poor circulation   . Recurrent ventral hernia 02/16/2012  . Hernia (acquired) (recurrent) 02/10/2011    Past Surgical History:  Procedure Laterality Date  . APPENDECTOMY    . CARDIAC CATHETERIZATION  11/2003   EF 50-55%  . CARDIAC CATHETERIZATION  08/2009  . CATARACT EXTRACTION EXTRACAPSULAR Right 05/17/2013   Procedure: CATARACT EXTRACTION EXTRACAPSULAR WITH INTRAOCULAR LENS PLACEMENT (IOC) RIGHT EYE;  Surgeon: Marylynn Pearson, MD;  Location: [facility];  Service: Ophthalmology;  Laterality: Right;  . COLONOSCOPY    . HERNIA REPAIR    . VENTRAL HERNIA REPAIR  03/07/2012   Procedure: LAPAROSCOPIC VENTRAL HERNIA;  Surgeon: Gwenyth Ober, MD;  Location: [facility];  Service: General;  Laterality: N/A;       Home Medications    Prior to Admission medications   Medication Sig Start Date End Date Taking? Authorizing Provider  acetaminophen (TYLENOL) 500 MG tablet Take 1,000 mg by mouth every 6 (six) hours as needed for headache.    Historical Provider, MD  allopurinol (ZYLOPRIM) 100 MG tablet Take 100 mg by mouth daily.    Historical Provider, MD  aspirin EC 81 MG tablet Take 1 tablet (81 mg total) by mouth daily. 03/07/15   Larey Dresser, MD  carvedilol (COREG) 6.25 MG tablet Take 1 tablet (6.25 mg  total) by mouth 2 (two) times daily with a meal. 05/04/16   Jonetta Osgood, MD  dorzolamide (TRUSOPT) 2 % ophthalmic solution Place 1 drop into both eyes 3 (three) times daily.    Marylynn Pearson, MD  furosemide (LASIX) 20 MG tablet Take 20 mg by mouth daily.    Historical Provider, MD  hydrALAZINE (APRESOLINE) 100 MG tablet Take 100 mg by mouth 3 (three) times daily.    Historical Provider, MD  isosorbide mononitrate (IMDUR) 30 MG 24 hr tablet Take 30 mg by mouth daily.    Historical Provider, MD  nitroGLYCERIN (NITROSTAT) 0.4 MG SL tablet Place 0.4 mg under the tongue every 5 (five) minutes as needed for chest pain. X 3 doses    Historical Provider, MD  potassium chloride (K-DUR) 10 MEQ tablet Take 10 mEq by mouth every other day. Every other day with lasix    Historical Provider, MD  rosuvastatin (CRESTOR) 5 MG tablet Take 1 tablet (5 mg total) by mouth daily. 03/07/15   Larey Dresser, MD  Travoprost, BAK  Free, (TRAVATAN) 0.004 % SOLN ophthalmic solution Place 1 drop into both eyes at bedtime.    Marylynn Pearson, MD    Family History Family History  Problem Relation Age of Onset  . CVA Mother   . Hypertension Brother   . Hypertension Sister   . Hypertension Brother   . Stroke Sister   . Heart attack Neg Hx     Social History Social History  Substance Use Topics  . Smoking status: Never Smoker  . Smokeless tobacco: Never Used  . Alcohol use No     Comment: 89/1/17- "stopped over 6 months ago"     Allergies   No known allergies   Review of Systems Review of Systems  Constitutional: Negative for appetite change, chills, diaphoresis, fatigue, fever and malaise/fatigue.  HENT: Negative for congestion and rhinorrhea.   Eyes: Positive for visual disturbance (pt has chronic clurry vision with no changes reported).  Respiratory: Negative for cough, hemoptysis, chest tightness, shortness of breath, wheezing and stridor.   Cardiovascular: Positive for palpitations. Negative for chest  pain, leg swelling, syncope and near-syncope.  Gastrointestinal: Negative for abdominal pain, anorexia, constipation, diarrhea, nausea and vomiting.  Genitourinary: Negative for dysuria and hematuria.  Musculoskeletal: Negative for back pain, neck pain and neck stiffness.  Skin: Negative for rash and wound.  Neurological: Positive for headaches. Negative for dizziness, tremors, seizures, syncope, weakness, light-headedness and numbness.  Psychiatric/Behavioral: Negative for agitation and confusion.  All other systems reviewed and are negative.    Physical Exam Updated Vital Signs BP 126/85 (BP Location: Right Arm)   Pulse 70   Temp 97.7 F (36.5 C) (Oral)   Resp 20   SpO2 98%   Physical Exam  Constitutional: He is oriented to person, place, and time. He appears well-developed and well-nourished.  HENT:  Head: Normocephalic and atraumatic.  Nose: Nose normal.  Mouth/Throat: Oropharynx is clear and moist. No oropharyngeal exudate.  Eyes: Conjunctivae and EOM are normal. Pupils are equal, round, and reactive to light.  Neck: Normal range of motion. Neck supple.  Cardiovascular: Normal rate, regular rhythm, normal heart sounds and intact distal pulses.   No murmur heard. Pulmonary/Chest: Effort normal and breath sounds normal. No stridor. No respiratory distress. He has no wheezes. He exhibits no tenderness.  Abdominal: Soft. There is no tenderness.  Musculoskeletal: He exhibits no edema or tenderness.  Neurological: He is alert and oriented to person, place, and time. He has normal reflexes. He is not disoriented. He displays normal reflexes. No cranial nerve deficit or sensory deficit. He exhibits normal muscle tone. He displays no seizure activity. Coordination normal. GCS eye subscore is 4. GCS verbal subscore is 5. GCS motor subscore is 6.  Skin: Skin is warm and dry. Capillary refill takes less than 2 seconds. No erythema. No pallor.  Psychiatric: He has a normal mood and affect.   Nursing note and vitals reviewed.    ED Treatments / Results  Labs (all labs ordered are listed, but only abnormal results are displayed) Labs Reviewed  BASIC METABOLIC PANEL - Abnormal; Notable for the following:       Result Value   CO2 21 (*)    Glucose, Bld 134 (*)    Creatinine, Ser 1.67 (*)    GFR calc non Af Amer 37 (*)    GFR calc Af Amer 43 (*)    All other components within normal limits  CBC - Abnormal; Notable for the following:    RBC 6.16 (*)  Hemoglobin 17.2 (*)    All other components within normal limits  MAGNESIUM  I-STAT TROPOININ, ED  I-STAT TROPOININ, ED    EKG  EKG Interpretation  Date/Time:  Saturday May 09 2016 13:47:23 EDT Ventricular Rate:  79 PR Interval:  212 QRS Duration: 96 QT Interval:  416 QTC Calculation: 477 R Axis:   55 Text Interpretation:  Sinus rhythm with 1st degree A-V block Otherwise normal ECG Confirmed by Sherry Ruffing MD, Kysean Sweet [phone]) on 05/10/2016 2:47:58 AM       Radiology Dg Chest 2 View  Result Date: 05/09/2016 CLINICAL DATA:  81 year old male with history of palpitations and rapid heart rate for 1 day. EXAM: CHEST  2 VIEW COMPARISON:  Chest x-ray a 05/02/2016. FINDINGS: Lung volumes are normal. No consolidative airspace disease. No pleural effusions. No pneumothorax. No pulmonary nodule or mass noted. Pulmonary vasculature and the cardiomediastinal silhouette are within normal limits. Atherosclerosis in the thoracic aorta. IMPRESSION: 1.  No radiographic evidence of acute cardiopulmonary disease. 2. Aortic atherosclerosis. Electronically Signed   By: Vinnie Langton M.D.   On: 05/09/2016 16:06   Ct Head Wo Contrast  Result Date: 05/09/2016 CLINICAL DATA:  Headache and blurry vision, palpitations. EXAM: CT HEAD WITHOUT CONTRAST TECHNIQUE: Contiguous axial images were obtained from the base of the skull through the vertex without intravenous contrast. COMPARISON:  CT head dated 02/05/2015, CT head dated  07/08/2014 and MRI brain dated 02/23/2014. FINDINGS: Brain: Again noted is mild generalized age related parenchymal atrophy with commensurate dilatation of the ventricles and sulci. No hydrocephalus. Chronic small vessel ischemic changes again noted within the bilateral periventricular and subcortical white matter regions. There is no mass, hemorrhage, edema or other evidence of acute parenchymal abnormality. No extra-axial hemorrhage. Vascular: No hyperdense vessel or unexpected calcification. Skull: Normal. Negative for fracture or focal lesion. Sinuses/Orbits: New air-fluid level within the sphenoid sinus. Visualized upper paranasal sinuses otherwise clear. Periorbital and retro-orbital soft tissues are unremarkable. Other: None. IMPRESSION: 1. No acute intracranial abnormality. No intracranial mass, hemorrhage or edema. 2. Stable atrophy and chronic ischemic changes in the white matter. 3. Sphenoid sinusitis, new compared to the previous exam. Electronically Signed   By: Franki Cabot M.D.   On: 05/09/2016 19:25    Procedures Procedures (including critical care time)  Medications Ordered in ED Medications  sodium chloride 0.9 % bolus 1,000 mL (0 mLs Intravenous Stopped 05/09/16 2013)  prochlorperazine (COMPAZINE) injection 10 mg (10 mg Intravenous Given 05/09/16 1715)  diphenhydrAMINE (BENADRYL) injection 12.5 mg (12.5 mg Intravenous Given 05/09/16 1713)     Initial Impression / Assessment and Plan / ED Course  I have reviewed the triage vital signs and the nursing notes.  Pertinent labs & imaging results that were available during my care of the patient were reviewed by me and considered in my medical decision making (see chart for details).  Clinical Course    TRISTAIN DAILY is a 81 y.o. male With a past medical history significant for CAD, hypertension, stroke, SVT, CHF, and CAD who presents with headache and recurrent palpitations.   History and exam are seen above. Patient reports  symptoms are very similar to when he was dehydrated recently. Given headache being different than some prior headaches, CT had ordered. No acute abnormality seen on CT imaging. Sinusitis was seen however, patient has no rhinorrhea, congestion, or symptoms of infection.  Given patient's palpitations with history of arrhythmias and cardiac pathology, patient had screening laboratory imaging testing to look for occult infection or  electrolyte abnormalities. EKG Showed no signs of acute ischemia. Patient denied chest pain. Magnesium normal, troponin negative,BNP showed elevated creatinine however this is similar to prior. Potassium normal. CBC showed elevated hemoglobin likely secondary to his reported dehydration. Chest x-ray showed No evidence of pneumonia or other changes.  Patient reported feeling improvement in headache following medications and fluids.  Given patient symptoms, feel he was likely dehydrated. This was treated with fluids in ED. Given his lack of electrolyte abnormality or other signs of infection, and given his resolution of headache with reassuring CT, feel patient is appropriate for discharge. Patient understands return precautions for new or worsening symptoms and agrees a plan to follow up with PCP. Patient had no other questions or concerns patient discharged in good condition with resolution of presenting symptoms.    Final Clinical Impressions(s) / ED Diagnoses   Final diagnoses:  Headache, unspecified headache type  Other fatigue  Palpitations    New Prescriptions New Prescriptions   No medications on file    Clinical Impression: 1. Headache, unspecified headache type   2. Other fatigue   3. Palpitations     Disposition: Discharge  Condition: Good  I have discussed the results, Dx and Tx plan with the pt(& family if present). He/she/they expressed understanding and agree(s) with the plan. Discharge instructions discussed at great length. Strict return  precautions discussed and pt &/or family have verbalized understanding of the instructions. No further questions at time of discharge.    Discharge Medication List as of 05/09/2016  9:49 PM      Follow Up: Glendale Chard, MD [address] [place] [address] [phone]  Schedule an appointment as soon as possible for a visit  If symptoms worsen, please return to the nearest ED.     Gwenyth Allegra Adria Costley, MD 05/10/16 [phone]

## 2016-05-11 DIAGNOSIS — I5032 Chronic diastolic (congestive) heart failure: Secondary | ICD-10-CM | POA: Diagnosis not present

## 2016-05-11 DIAGNOSIS — E785 Hyperlipidemia, unspecified: Secondary | ICD-10-CM | POA: Diagnosis not present

## 2016-05-11 DIAGNOSIS — K219 Gastro-esophageal reflux disease without esophagitis: Secondary | ICD-10-CM | POA: Diagnosis not present

## 2016-05-11 DIAGNOSIS — N183 Chronic kidney disease, stage 3 (moderate): Secondary | ICD-10-CM | POA: Diagnosis not present

## 2016-05-11 DIAGNOSIS — I2510 Atherosclerotic heart disease of native coronary artery without angina pectoris: Secondary | ICD-10-CM | POA: Diagnosis not present

## 2016-05-11 DIAGNOSIS — Z8673 Personal history of transient ischemic attack (TIA), and cerebral infarction without residual deficits: Secondary | ICD-10-CM | POA: Diagnosis not present

## 2016-05-11 DIAGNOSIS — I129 Hypertensive chronic kidney disease with stage 1 through stage 4 chronic kidney disease, or unspecified chronic kidney disease: Secondary | ICD-10-CM | POA: Diagnosis not present

## 2016-05-12 NOTE — Telephone Encounter (Signed)
LMTCB for Trevor Murphy

## 2016-05-15 NOTE — Telephone Encounter (Signed)
appt with Melina Copa 05/18/16

## 2016-05-17 ENCOUNTER — Encounter: Payer: Self-pay | Admitting: Physician Assistant

## 2016-05-17 NOTE — Progress Notes (Signed)
Cardiology Office Note    Date:  05/18/2016  ID:  VIHAAN GLOSS, DOB [birth year], MRN [id number] PCP:  Maximino Greenland, MD  Cardiologist:  Dr. Aundra Dubin   Chief Complaint: f/u sinus bradycardia  History of Present Illness:  DAOUD LOBUE is a 81 y.o. male with history of chronic diastolic CHF, SVT (10/90; also 12/2013 associated with minimal TnI elevation with subsequent normal stress test 01/2014, again in 04/2016), minimal nonobstructive CAD by cath 2011, HTN, CKD stage III, stroke, L eye blindness, h/o EtOH abuse who presents for post-hospital followup.   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 Initial EKG showed NSR, HR 84, with 1st degree AV Block. While in the pre-op area he was noted to have SVT HR 120s-140s. He received adenosine with conversion to NSR - felt to represent SVT. He had missed his dose of Coreg that morning. Labs 04/14/16: K 4.1, BUN 17, Cr 1.81, Hgb 17.4. I saw him in follow-up on 04/24/16 at which time he and his sister verbalized interest in f/u with EP as previously discussed to review SVT ablation. This was scheduled but missed his appointment due to hospital admission late Sept 2017 with generalized weakness, found to have soft HR in the 40s and soft BP - amlodipine and clonidine were discontinued and Coreg was decreased. TSH wnl, Hgb 14.4. Recent Cr trend 1.33->1.67. Seen in ED on 05/09/16 with headache which improved with IVF for dehydration, CT head with sinusitis otherwise nonacute.   He presents back to clinic today for recheck - BP and HR stable. Says he's been "doing pretty good" since discharge. No Cp, syncope, palpitations. Chronic unchanged DOE is stable. He and his sister are interested in revisiting the EP referral as previously recommended.   Past Medical History:  Diagnosis Date  . Arthritis   . Blindness of left eye   . CAD (coronary artery disease)    a. Cath  08/2009: minimal nonobstructive disease (25% LAD). b. Lexiscan nuc 6/215: EF 54%, no ischemia or infarction.  . Chronic diastolic CHF (congestive heart failure) ([place])    a. Echo 2013: EF 55-60%, mild AI, mild MR. b. Echo 5/15 with EF 60-65%, no WMA, mild LVH, mild AI.  [name] Kitchen CKD (chronic kidney disease) stage 3, GFR 30-59 ml/min 07/26/2012   a. Felt likely due to HTN.  . CVA (cerebral infarction) 07/28/2012  . Glaucoma   . Gout   . H/O umbilical hernia repair   . Headache(784.0)    " WHEN MY BLOOD PRESSURE IS UP"  . History of acute renal failure   . History of alcohol abuse   . HTN (hypertension)   . Hypertension    a. Patent renal arteries 2005.  [name] Kitchen Noncompliance   . OA (osteoarthritis)   . Other and unspecified hyperlipidemia   . Poor circulation   . Sinus bradycardia   . SVT (supraventricular tachycardia) ([place])    a. Possible AVNRT.  Got adenosine from EMS in 2015 for SVT.  During 5/15 hospitalization had episode of short R-P tachycardia (possible AVNRT) terminated by adenosine.  b. 04/2016 - recurrent tachycardia (SVT vs. AVNRT), again terminated by Adenosine    Past Surgical History:  Procedure Laterality Date  . APPENDECTOMY    . CARDIAC CATHETERIZATION  11/2003   EF 50-55%  . CARDIAC CATHETERIZATION  08/2009  . CATARACT EXTRACTION EXTRACAPSULAR Right 05/17/2013   Procedure: CATARACT EXTRACTION EXTRACAPSULAR  WITH INTRAOCULAR LENS PLACEMENT (IOC) RIGHT EYE;  Surgeon: Marylynn Pearson, MD;  Location: [place];  Service: Ophthalmology;  Laterality: Right;  . COLONOSCOPY    . HERNIA REPAIR    . VENTRAL HERNIA REPAIR  03/07/2012   Procedure: LAPAROSCOPIC VENTRAL HERNIA;  Surgeon: Gwenyth Ober, MD;  Location: [place];  Service: General;  Laterality: N/A;    Current Medications: Current Outpatient Prescriptions  Medication Sig Dispense Refill  . acetaminophen (TYLENOL) 500 MG tablet Take 1,000 mg by mouth every 6 (six) hours as needed for headache.    . allopurinol (ZYLOPRIM) 100 MG tablet  Take 100 mg by mouth daily.    [name] Kitchen aspirin EC 81 MG tablet Take 1 tablet (81 mg total) by mouth daily. 90 tablet 3  . carvedilol (COREG) 6.25 MG tablet Take 1 tablet (6.25 mg total) by mouth 2 (two) times daily with a meal. 60 tablet 0  . dorzolamide (TRUSOPT) 2 % ophthalmic solution Place 1 drop into both eyes 3 (three) times daily.    . furosemide (LASIX) 20 MG tablet Take 20 mg by mouth daily.    . hydrALAZINE (APRESOLINE) 100 MG tablet Take 100 mg by mouth 3 (three) times daily.    . isosorbide mononitrate (IMDUR) 30 MG 24 hr tablet Take 30 mg by mouth daily.    . nitroGLYCERIN (NITROSTAT) 0.4 MG SL tablet Place 0.4 mg under the tongue every 5 (five) minutes as needed for chest pain. X 3 doses    . potassium chloride (K-DUR) 10 MEQ tablet Take 10 mEq by mouth every other day. Every other day with lasix    . rosuvastatin (CRESTOR) 5 MG tablet Take 1 tablet (5 mg total) by mouth daily. 90 tablet 3  . Travoprost, BAK Free, (TRAVATAN) 0.004 % SOLN ophthalmic solution Place 1 drop into both eyes at bedtime.     No current facility-administered medications for this visit.      Allergies:   Review of patient's allergies indicates no known allergies.   Social History   Social History  . Marital status: Married    Spouse name: N/A  . Number of children: N/A  . Years of education: N/A   Social History Main Topics  . Smoking status: Never Smoker  . Smokeless tobacco: Never Used  . Alcohol use No     Comment: 89/1/17- "stopped over 6 months ago"  . Drug use: No  . Sexual activity: Not Asked   Other Topics Concern  . None   Social History Narrative  . None     Family History:  The patient's family history includes CVA in his mother; Hypertension in his brother, brother, and sister; Stroke in his sister.   ROS:   Please see the history of present illness. All other systems are reviewed and otherwise negative.    PHYSICAL EXAM:   VS:  BP 138/82   Pulse 85   Ht 5\' 10"  (1.778 m)    Wt 164 lb 12.8 oz (74.8 kg)   SpO2 98%   BMI 23.65 kg/m   BMI: Body mass index is 23.65 kg/m. GEN: Well nourished, well developed AAM, in no acute distress  HEENT: normocephalic, atraumatic, arcus senilis Neck: no JVD, carotid bruits, or masses Cardiac: RRR; no murmurs, rubs, or gallops, no edema  Respiratory:  clear to auscultation bilaterally, normal work of breathing GI: soft, nontender, nondistended, + BS MS: no deformity or atrophy  Skin: warm and dry, no rash Neuro:  Alert and Oriented x  3, Strength and sensation are intact, follows commands Psych: euthymic mood, full affect  Wt Readings from Last 3 Encounters:  05/18/16 164 lb 12.8 oz (74.8 kg)  05/04/16 164 lb 6.4 oz (74.6 kg)  04/24/16 169 lb 1.9 oz (76.7 kg)      Studies/Labs Reviewed:   EKG:  EKG was ordered today and personally reviewed by me and demonstrates NSR 74bpm IRBBB 1st degree AVB, nonspecific changes.  Recent Labs: 04/14/2016: ALT 20 05/02/2016: B Natriuretic Peptide 28.3 05/03/2016: TSH 1.099 05/09/2016: BUN 15; Creatinine, Ser 1.67; Hemoglobin 17.2; Magnesium 2.1; Platelets 208; Potassium 4.1; Sodium 138   Lipid Panel    Component Value Date/Time   CHOL 163 04/11/2014 2150   TRIG 192 (H) 04/11/2014 2150   HDL 39 (L) 04/11/2014 2150   CHOLHDL 4.2 04/11/2014 2150   VLDL 38 04/11/2014 2150   LDLCALC 86 04/11/2014 2150    Additional studies/ records that were reviewed today include: Summarized above.    ASSESSMENT & PLAN:   1. Sinus bradycardia - improved with reduction of BB. Continue lower dose. Recent TSH wnl. See below re: SVT. 2. Paroxysmal SVT - patient and his sister are still interested in talking to EP about ablation. He missed this recently due to hospitalization. I do think this should be considered as his beta blocker recently had to be cut down due to bradycardia, so we are not able to push preventative medical therapy up any further. Will help him reschedule this  appointment. 3. Essential HTN - recent admission for hypotension, now off clonidine and amlodipine. BP wnl today. Continue current regimen as tolerated. 4. Chronic diastolic CHF - appears euvolemic. Recent BNP normal. Follow BP. 5. Mild CAD - stable. Continue aspirin, BB, statin.  Disposition: F/u with EP next available (patient has no preference).   Medication Adjustments/Labs and Tests Ordered: Current medicines are reviewed at length with the patient today.  Concerns regarding medicines are outlined above. Medication changes, Labs and Tests ordered today are summarized above and listed in the Patient Instructions accessible in Encounters.   Raechel Ache PA-C  05/18/2016 10:55 AM    [address], [place], [place]  [address] Phone: [phone]; Fax: [phone]

## 2016-05-18 ENCOUNTER — Ambulatory Visit (INDEPENDENT_AMBULATORY_CARE_PROVIDER_SITE_OTHER): Payer: Medicare Other | Admitting: Physician Assistant

## 2016-05-18 ENCOUNTER — Encounter: Payer: Self-pay | Admitting: Physician Assistant

## 2016-05-18 VITALS — BP 138/82 | HR 85 | Ht 70.0 in | Wt 164.8 lb

## 2016-05-18 DIAGNOSIS — K219 Gastro-esophageal reflux disease without esophagitis: Secondary | ICD-10-CM | POA: Diagnosis not present

## 2016-05-18 DIAGNOSIS — I10 Essential (primary) hypertension: Secondary | ICD-10-CM

## 2016-05-18 DIAGNOSIS — I471 Supraventricular tachycardia: Secondary | ICD-10-CM | POA: Diagnosis not present

## 2016-05-18 DIAGNOSIS — E785 Hyperlipidemia, unspecified: Secondary | ICD-10-CM | POA: Diagnosis not present

## 2016-05-18 DIAGNOSIS — I5032 Chronic diastolic (congestive) heart failure: Secondary | ICD-10-CM

## 2016-05-18 DIAGNOSIS — I129 Hypertensive chronic kidney disease with stage 1 through stage 4 chronic kidney disease, or unspecified chronic kidney disease: Secondary | ICD-10-CM | POA: Diagnosis not present

## 2016-05-18 DIAGNOSIS — Z8673 Personal history of transient ischemic attack (TIA), and cerebral infarction without residual deficits: Secondary | ICD-10-CM | POA: Diagnosis not present

## 2016-05-18 DIAGNOSIS — N183 Chronic kidney disease, stage 3 (moderate): Secondary | ICD-10-CM | POA: Diagnosis not present

## 2016-05-18 DIAGNOSIS — R001 Bradycardia, unspecified: Secondary | ICD-10-CM | POA: Diagnosis not present

## 2016-05-18 DIAGNOSIS — I2510 Atherosclerotic heart disease of native coronary artery without angina pectoris: Secondary | ICD-10-CM | POA: Diagnosis not present

## 2016-05-18 NOTE — Patient Instructions (Signed)
**Note De-Identified Lakelyn Straus Obfuscation** Medication Instructions:  Same-no changes  Labwork: None  Testing/Procedures: None  Follow-Up: With EP 1st available     If you need a refill on your cardiac medications before your next appointment, please call your pharmacy.

## 2016-05-21 ENCOUNTER — Encounter: Payer: Self-pay | Admitting: Internal Medicine

## 2016-05-21 ENCOUNTER — Ambulatory Visit (INDEPENDENT_AMBULATORY_CARE_PROVIDER_SITE_OTHER): Payer: Medicare Other | Admitting: Internal Medicine

## 2016-05-21 VITALS — BP 124/70 | HR 61 | Ht 70.0 in | Wt 164.2 lb

## 2016-05-21 DIAGNOSIS — I471 Supraventricular tachycardia: Secondary | ICD-10-CM

## 2016-05-21 NOTE — Patient Instructions (Addendum)
Medication Instructions:  Your physician recommends that you continue on your current medications as directed. Please refer to the Current Medication list given to you today.   Labwork: None ordered   Testing/Procedures: None ordered   Follow-Up: Your physician wants you to follow-up in: 6 months with Chanetta Marshall, NP.  You will receive a reminder letter in the mail two months in advance. If you don't receive a letter, please call our office to schedule the follow-up appointment.

## 2016-05-25 DIAGNOSIS — E785 Hyperlipidemia, unspecified: Secondary | ICD-10-CM | POA: Diagnosis not present

## 2016-05-25 DIAGNOSIS — Z8673 Personal history of transient ischemic attack (TIA), and cerebral infarction without residual deficits: Secondary | ICD-10-CM | POA: Diagnosis not present

## 2016-05-25 DIAGNOSIS — K219 Gastro-esophageal reflux disease without esophagitis: Secondary | ICD-10-CM | POA: Diagnosis not present

## 2016-05-25 DIAGNOSIS — I5032 Chronic diastolic (congestive) heart failure: Secondary | ICD-10-CM | POA: Diagnosis not present

## 2016-05-25 DIAGNOSIS — N183 Chronic kidney disease, stage 3 (moderate): Secondary | ICD-10-CM | POA: Diagnosis not present

## 2016-05-25 DIAGNOSIS — I129 Hypertensive chronic kidney disease with stage 1 through stage 4 chronic kidney disease, or unspecified chronic kidney disease: Secondary | ICD-10-CM | POA: Diagnosis not present

## 2016-05-25 DIAGNOSIS — I2510 Atherosclerotic heart disease of native coronary artery without angina pectoris: Secondary | ICD-10-CM | POA: Diagnosis not present

## 2016-05-27 NOTE — Progress Notes (Signed)
Electrophysiology Office Note   Date:  05/27/2016   ID:  Trevor Murphy, DOB May 18, 1935, MRN [id number]  PCP:  Maximino Greenland, MD  Cardiologist:  Dr Aundra Dubin Primary Electrophysiologist: Thompson Grayer, MD    Chief Complaint  Patient presents with  . New Patient (Initial Visit)    SVT     History of Present Illness: Trevor Murphy is a 81 y.o. male who presents today for electrophysiology evaluation.   He has multiple chronic medical issues.  He has a h/o recurrent SVT.  Most recently, he presented to [facility] for planned eye surgery.  He was noted to develop SVT in the preoperative area (Rates 120s-140s).  He received adenosine and converted to sinus rhythm.  He had missed a dose of coreg prior to the episode.  He has had prior SVT though infrequently.   Today, he denies symptoms of palpitations, chest pain, orthopnea, PND, lower extremity edema, claudication, dizziness, presyncope, syncope, bleeding, or neurologic sequela.  He has chronic SOB.  The patient is tolerating medications without difficulties and is otherwise without complaint today.    Past Medical History:  Diagnosis Date  . Arthritis   . Blindness of left eye   . CAD (coronary artery disease)    a. Cath 08/2009: minimal nonobstructive disease (25% LAD). b. Lexiscan nuc 6/215: EF 54%, no ischemia or infarction.  . Chronic diastolic CHF (congestive heart failure) ([place])    a. Echo 2013: EF 55-60%, mild AI, mild MR. b. Echo 5/15 with EF 60-65%, no WMA, mild LVH, mild AI.  [name] Kitchen CKD (chronic kidney disease) stage 3, GFR 30-59 ml/min 07/26/2012   a. Felt likely due to HTN.  . CVA (cerebral infarction) 07/28/2012  . Glaucoma   . Gout   . H/O umbilical hernia repair   . Headache(784.0)    " WHEN MY BLOOD PRESSURE IS UP"  . History of acute renal failure   . History of alcohol abuse   . HTN (hypertension)   . Hypertension    a. Patent renal arteries 2005.  [name] Kitchen Noncompliance   . OA (osteoarthritis)   . Other and unspecified  hyperlipidemia   . Poor circulation   . Sinus bradycardia   . SVT (supraventricular tachycardia) ([place])    a. Possible AVNRT.  Got adenosine from EMS in 2015 for SVT.  During 5/15 hospitalization had episode of short R-P tachycardia (possible AVNRT) terminated by adenosine.  b. 04/2016 - recurrent tachycardia (SVT vs. AVNRT), again terminated by Adenosine   Past Surgical History:  Procedure Laterality Date  . APPENDECTOMY    . CARDIAC CATHETERIZATION  11/2003   EF 50-55%  . CARDIAC CATHETERIZATION  08/2009  . CATARACT EXTRACTION EXTRACAPSULAR Right 05/17/2013   Procedure: CATARACT EXTRACTION EXTRACAPSULAR WITH INTRAOCULAR LENS PLACEMENT (IOC) RIGHT EYE;  Surgeon: Marylynn Pearson, MD;  Location: [place];  Service: Ophthalmology;  Laterality: Right;  . COLONOSCOPY    . HERNIA REPAIR    . VENTRAL HERNIA REPAIR  03/07/2012   Procedure: LAPAROSCOPIC VENTRAL HERNIA;  Surgeon: Gwenyth Ober, MD;  Location: [place];  Service: General;  Laterality: N/A;     Current Outpatient Prescriptions  Medication Sig Dispense Refill  . acetaminophen (TYLENOL) 500 MG tablet Take 1,000 mg by mouth every 6 (six) hours as needed for headache.    . allopurinol (ZYLOPRIM) 100 MG tablet Take 100 mg by mouth daily.    [name] Kitchen aspirin EC 81 MG tablet Take 1 tablet (81 mg total) by mouth daily. [place]  tablet 3  . carvedilol (COREG) 6.25 MG tablet Take 1 tablet (6.25 mg total) by mouth 2 (two) times daily with a meal. 60 tablet 0  . dorzolamide (TRUSOPT) 2 % ophthalmic solution Place 1 drop into both eyes 3 (three) times daily.    . furosemide (LASIX) 20 MG tablet Take 20 mg by mouth daily.    . hydrALAZINE (APRESOLINE) 100 MG tablet Take 100 mg by mouth 3 (three) times daily.    . isosorbide mononitrate (IMDUR) 30 MG 24 hr tablet Take 30 mg by mouth daily.    . nitroGLYCERIN (NITROSTAT) 0.4 MG SL tablet Place 0.4 mg under the tongue every 5 (five) minutes as needed for chest pain. X 3 doses    . potassium chloride (K-DUR) 10 MEQ tablet  Take 10 mEq by mouth every other day. Takes lasix everyday    . rosuvastatin (CRESTOR) 5 MG tablet Take 1 tablet (5 mg total) by mouth daily. 90 tablet 3  . Travoprost, BAK Free, (TRAVATAN) 0.004 % SOLN ophthalmic solution Place 1 drop into both eyes at bedtime.     No current facility-administered medications for this visit.     Allergies:   Review of patient's allergies indicates no known allergies.   Social History:  The patient  reports that he has never smoked. He has never used smokeless tobacco. He reports that he does not drink alcohol or use drugs.   Family History:  The patient's  family history includes CVA in his mother; Hypertension in his brother, brother, and sister; Stroke in his sister.    ROS:  Please see the history of present illness.   All other systems are reviewed and negative.    PHYSICAL EXAM: VS:  BP 124/70   Pulse 61   Ht 5\' 10"  (1.778 m)   Wt 164 lb 3.2 oz (74.5 kg)   BMI 23.56 kg/m  , BMI Body mass index is 23.56 kg/m. GEN: Well nourished, well developed, in no acute distress  HEENT: normal  Neck: no JVD, carotid bruits, or masses Cardiac: RRR; no murmurs, rubs, or gallops,no edema  Respiratory:  clear to auscultation bilaterally, normal work of breathing GI: soft, nontender, nondistended, + BS MS: no deformity or atrophy  Skin: warm and dry  Neuro:  Strength and sensation are intact Psych: euthymic mood, full affect  EKG:  EKG 05/18/16 reveals sinus rhythm 74 bpm, PR 236 msec, incomplete RBBB, QRS 103 msec, QTc 470 msec  ekg 04/14/16 is reviewed and reveals short RP SVT at 120 bpm   Recent Labs: 04/14/2016: ALT 20 05/02/2016: B Natriuretic Peptide 28.3 05/03/2016: TSH 1.099 05/09/2016: BUN 15; Creatinine, Ser 1.67; Hemoglobin 17.2; Magnesium 2.1; Platelets 208; Potassium 4.1; Sodium 138    Lipid Panel     Component Value Date/Time   CHOL 163 04/11/2014 2150   TRIG 192 (H) 04/11/2014 2150   HDL 39 (L) 04/11/2014 2150   CHOLHDL 4.2 04/11/2014  2150   VLDL 38 04/11/2014 2150   LDLCALC 86 04/11/2014 2150     Wt Readings from Last 3 Encounters:  05/21/16 164 lb 3.2 oz (74.5 kg)  05/18/16 164 lb 12.8 oz (74.8 kg)  05/04/16 164 lb 6.4 oz (74.6 kg)      Other studies Reviewed: Additional studies/ records that were reviewed today include: Dayna Dunn's notes, EPIC records and ekgs  Review of the above records today demonstrates: as above   ASSESSMENT AND PLAN:  1.  SVT The patient has minimally symptomatic short RP  SVT which is typically controlled with coreg.  Given his baseline conduction system disease with modest first degree AV block and incomplete RBBB (consistent with a 71 yo degenerative conduction system), I would favor a conservative medicine approach.  His risks of AV block with ablation would be high.  If he cannot be managed medically then we could consider ablation, though I suspect that he will continue to do fine.  No changes are made today  2. preop OK to proceed with eye surgery from an EP standpoint. He should continue his coreg perioperatively.   Follow-up:  Return to see EP NP in 6 months    Signed, Thompson Grayer, MD   [place] [address] [phone] (office) [phone] (fax)

## 2016-07-29 ENCOUNTER — Ambulatory Visit (INDEPENDENT_AMBULATORY_CARE_PROVIDER_SITE_OTHER): Payer: Medicare Other | Admitting: Podiatry

## 2016-07-29 DIAGNOSIS — L608 Other nail disorders: Secondary | ICD-10-CM

## 2016-07-29 DIAGNOSIS — M79609 Pain in unspecified limb: Secondary | ICD-10-CM | POA: Diagnosis not present

## 2016-07-29 DIAGNOSIS — L603 Nail dystrophy: Secondary | ICD-10-CM

## 2016-07-29 DIAGNOSIS — E0843 Diabetes mellitus due to underlying condition with diabetic autonomic (poly)neuropathy: Secondary | ICD-10-CM

## 2016-07-29 DIAGNOSIS — M79676 Pain in unspecified toe(s): Secondary | ICD-10-CM | POA: Diagnosis not present

## 2016-07-29 DIAGNOSIS — B351 Tinea unguium: Secondary | ICD-10-CM

## 2016-07-29 NOTE — Progress Notes (Signed)
SUBJECTIVE Patient with a history of diabetes mellitus presents to office today complaining of elongated, thickened nails. Pain while ambulating in shoes. Patient is unable to trim their own nails.   No Known Allergies  OBJECTIVE General Patient is awake, alert, and oriented x 3 and in no acute distress. Derm Skin is dry and supple bilateral. Negative open lesions or macerations. Remaining integument unremarkable. Nails are tender, long, thickened and dystrophic with subungual debris, consistent with onychomycosis, 1-5 bilateral. No signs of infection noted. Vasc  DP and PT pedal pulses palpable bilaterally. Temperature gradient within normal limits.  Neuro Epicritic and protective threshold sensation diminished bilaterally.  Musculoskeletal Exam No symptomatic pedal deformities noted bilateral. Muscular strength within normal limits.  ASSESSMENT 1. Diabetes Mellitus w/ peripheral neuropathy 2. Onychomycosis of nail due to dermatophyte bilateral 3. Pain in foot bilateral  PLAN OF CARE 1. Patient evaluated today. 2. Instructed to maintain good pedal hygiene and foot care. Stressed importance of controlling blood sugar.  3. Mechanical debridement of nails 1-5 bilaterally performed using a nail nipper. Filed with dremel without incident.  4. Return to clinic in 3 mos.     Edrick Kins, DPM [facility]  Dr. Edrick Kins, [address]                                        [place], [place] [address]                Office [phone]  Fax [phone]

## 2016-08-04 ENCOUNTER — Other Ambulatory Visit: Payer: Self-pay | Admitting: Cardiology

## 2016-08-25 ENCOUNTER — Other Ambulatory Visit: Payer: Self-pay | Admitting: Cardiology

## 2016-10-27 ENCOUNTER — Other Ambulatory Visit: Payer: Self-pay | Admitting: Cardiology

## 2016-10-28 ENCOUNTER — Ambulatory Visit: Payer: Medicare Other | Admitting: Podiatry

## 2016-10-30 ENCOUNTER — Ambulatory Visit: Payer: Medicare Other | Admitting: Podiatry

## 2016-11-18 ENCOUNTER — Ambulatory Visit (INDEPENDENT_AMBULATORY_CARE_PROVIDER_SITE_OTHER): Payer: Medicare Other | Admitting: Podiatry

## 2016-11-18 ENCOUNTER — Encounter: Payer: Self-pay | Admitting: Podiatry

## 2016-11-18 DIAGNOSIS — M79609 Pain in unspecified limb: Secondary | ICD-10-CM

## 2016-11-18 DIAGNOSIS — B351 Tinea unguium: Secondary | ICD-10-CM

## 2016-11-18 DIAGNOSIS — W450XXA Nail entering through skin, initial encounter: Secondary | ICD-10-CM

## 2016-11-18 NOTE — Progress Notes (Signed)
Complaint:  Visit Type: Patient returns to my office for continued preventative foot care services. Complaint: Patient states" my nails have grown long and thick and become painful to walk and wear shoes" . The patient presents for preventative foot care services. No changes to ROS  Podiatric Exam: Vascular: dorsalis pedis and posterior tibial pulses are weakly  palpable bilateral. Capillary return is immediate. Temperature gradient is diminished. Skin turgor WNL  Sensorium: Normal Semmes Weinstein monofilament test. Normal tactile sensation bilaterally. Nail Exam: Pt has thick disfigured discolored nails with subungual debris noted bilateral entire nail hallux through fifth toenails Ulcer Exam: There is no evidence of ulcer or pre-ulcerative changes or infection. Orthopedic Exam: Muscle tone and strength are WNL. No limitations in general ROM. No crepitus or effusions noted. Foot type and digits show no abnormalities. Bony prominences are unremarkable. Skin: No Porokeratosis. No infection or ulcers  Diagnosis:  Onychomycosis, , Pain in right toe, pain in left toes  Treatment & Plan Procedures and Treatment: Consent by patient was obtained for treatment procedures. The patient understood the discussion of treatment and procedures well. All questions were answered thoroughly reviewed. Debridement of mycotic and hypertrophic toenails, 1 through 5 bilateral and clearing of subungual debris. Upon debridement of his mycotic nails. It was noted that his nail plate on his right hallux was unattached. He does remember banging this toe at home. He states these will not experiencing pain or discomfort and no drainage is present, this nail plate was totally removed at this time. Neosporin and dry sterile dressing was applied. Home instructions was given. He is to call the office if he has any problems with the right big toe healing. He also was told to return to the office in 10 weeks for further evaluation and  treatment  Return Visit-Office Procedure: Patient instructed to return to the office for a follow up visit 10 weeks  for continued evaluation and treatment.    Gardiner Barefoot DPM

## 2017-01-26 ENCOUNTER — Ambulatory Visit: Payer: Medicare Other | Admitting: Podiatry

## 2017-01-28 ENCOUNTER — Other Ambulatory Visit: Payer: Self-pay | Admitting: Cardiology

## 2017-01-28 ENCOUNTER — Other Ambulatory Visit: Payer: Self-pay | Admitting: Internal Medicine

## 2017-01-29 ENCOUNTER — Other Ambulatory Visit: Payer: Self-pay | Admitting: *Deleted

## 2017-01-29 MED ORDER — POTASSIUM CHLORIDE CRYS ER 10 MEQ PO TBCR: 10.0000 meq | EXTENDED_RELEASE_TABLET | ORAL | 0 refills | Status: DC

## 2017-01-29 MED ORDER — FUROSEMIDE 20 MG PO TABS: 20.0000 mg | ORAL_TABLET | Freq: Every day | ORAL | 0 refills | Status: DC

## 2017-01-29 MED ORDER — HYDRALAZINE HCL 100 MG PO TABS: 100.0000 mg | ORAL_TABLET | Freq: Three times a day (TID) | ORAL | 0 refills | Status: DC

## 2017-02-16 ENCOUNTER — Encounter: Payer: Self-pay | Admitting: Podiatry

## 2017-02-16 ENCOUNTER — Ambulatory Visit (INDEPENDENT_AMBULATORY_CARE_PROVIDER_SITE_OTHER): Payer: Medicare Other | Admitting: Podiatry

## 2017-02-16 VITALS — BP 172/87 | HR 71 | Temp 97.7°F

## 2017-02-16 DIAGNOSIS — M79609 Pain in unspecified limb: Secondary | ICD-10-CM | POA: Diagnosis not present

## 2017-02-16 DIAGNOSIS — B351 Tinea unguium: Secondary | ICD-10-CM

## 2017-02-16 DIAGNOSIS — W450XXA Nail entering through skin, initial encounter: Secondary | ICD-10-CM

## 2017-02-16 NOTE — Progress Notes (Signed)
Complaint:  Visit Type: Patient returns to my office for continued preventative foot care services. Complaint: Patient states" my nails have grown long and thick and become painful to walk and wear shoes" . The patient presents for preventative foot care services. No changes to ROS  Podiatric Exam: Vascular: dorsalis pedis and posterior tibial pulses are weakly  palpable bilateral. Capillary return is immediate. Temperature gradient is diminished. Skin turgor WNL  Sensorium: Normal Semmes Weinstein monofilament test. Normal tactile sensation bilaterally. Nail Exam: Pt has thick disfigured discolored nails with subungual debris noted bilateral entire nail hallux through fifth toenails Ulcer Exam: There is no evidence of ulcer or pre-ulcerative changes or infection. Orthopedic Exam: Muscle tone and strength are WNL. No limitations in general ROM. No crepitus or effusions noted. Foot type and digits show no abnormalities. Bony prominences are unremarkable. Skin: No Porokeratosis. No infection or ulcers  Diagnosis:  Onychomycosis, , Pain in right toe, pain in left toes  Treatment & Plan Procedures and Treatment: Consent by patient was obtained for treatment procedures. The patient understood the discussion of treatment and procedures well. All questions were answered thoroughly reviewed. Debridement of mycotic and hypertrophic toenails, 1 through 5 bilateral and clearing of subungual debris. Upon debridement of his mycotic nails. It was noted that his nail plate on his left hallux was unattached.  He states the great toenail left hallux  was not causing  pain or discomfort and no drainage is present. This nail plate was totally removed at this time. Neosporin and dry sterile dressing was applied. Home instructions was given. He is to call the office if he has any problems with the left  big toe healing. He also was told to return to the office in 10 weeks for further evaluation and treatment  Return  Visit-Office Procedure: Patient instructed to return to the office for a follow up visit 10 weeks  for continued evaluation and treatment.    Gardiner Barefoot DPM

## 2017-02-17 ENCOUNTER — Other Ambulatory Visit: Payer: Self-pay | Admitting: Cardiology

## 2017-02-17 ENCOUNTER — Other Ambulatory Visit: Payer: Self-pay | Admitting: Internal Medicine

## 2017-02-17 DIAGNOSIS — E782 Mixed hyperlipidemia: Secondary | ICD-10-CM | POA: Diagnosis not present

## 2017-02-17 DIAGNOSIS — I498 Other specified cardiac arrhythmias: Secondary | ICD-10-CM | POA: Diagnosis not present

## 2017-02-17 DIAGNOSIS — I129 Hypertensive chronic kidney disease with stage 1 through stage 4 chronic kidney disease, or unspecified chronic kidney disease: Secondary | ICD-10-CM | POA: Diagnosis not present

## 2017-02-17 DIAGNOSIS — R7309 Other abnormal glucose: Secondary | ICD-10-CM | POA: Diagnosis not present

## 2017-02-17 DIAGNOSIS — N08 Glomerular disorders in diseases classified elsewhere: Secondary | ICD-10-CM | POA: Diagnosis not present

## 2017-02-18 NOTE — Telephone Encounter (Signed)
Followed by Melina Copa

## 2017-02-19 ENCOUNTER — Telehealth: Payer: Self-pay | Admitting: *Deleted

## 2017-02-19 NOTE — Telephone Encounter (Signed)
Should both of these be one tablet qod? Please advise. Thanks, MI

## 2017-02-19 NOTE — Telephone Encounter (Signed)
LMOVM FOR PT TO CALL BACK  Monday  TO REFILL DEPARTMENT TO CLARIFY THE DOSAGE TAKEN  FOR BOTH K+ AND LASIX. FOR REFILL CLARIFICATION.

## 2017-04-27 ENCOUNTER — Ambulatory Visit: Payer: Medicare Other | Admitting: Podiatry

## 2017-05-19 ENCOUNTER — Other Ambulatory Visit: Payer: Self-pay

## 2017-05-19 MED ORDER — POTASSIUM CHLORIDE CRYS ER 10 MEQ PO TBCR: 10.0000 meq | EXTENDED_RELEASE_TABLET | ORAL | 0 refills | Status: DC

## 2017-06-08 ENCOUNTER — Other Ambulatory Visit: Payer: Self-pay | Admitting: Internal Medicine

## 2017-06-11 ENCOUNTER — Other Ambulatory Visit: Payer: Self-pay | Admitting: Internal Medicine

## 2017-06-18 ENCOUNTER — Encounter: Payer: Self-pay | Admitting: Podiatry

## 2017-06-18 ENCOUNTER — Ambulatory Visit (INDEPENDENT_AMBULATORY_CARE_PROVIDER_SITE_OTHER): Payer: Medicare Other | Admitting: Podiatry

## 2017-06-18 DIAGNOSIS — M79609 Pain in unspecified limb: Secondary | ICD-10-CM | POA: Diagnosis not present

## 2017-06-18 DIAGNOSIS — B351 Tinea unguium: Secondary | ICD-10-CM | POA: Diagnosis not present

## 2017-06-18 NOTE — Progress Notes (Signed)
Complaint:  Visit Type: Patient returns to my office for continued preventative foot care services. Complaint: Patient states" my nails have grown long and thick and become painful to walk and wear shoes" . The patient presents for preventative foot care services. No changes to ROS  Podiatric Exam: Vascular: dorsalis pedis and posterior tibial pulses are weakly  palpable bilateral. Capillary return is immediate. Temperature gradient is diminished. Skin turgor WNL  Sensorium: Normal Semmes Weinstein monofilament test. Normal tactile sensation bilaterally. Nail Exam: Pt has thick disfigured discolored nails with subungual debris noted bilateral entire nail hallux through fifth toenails Ulcer Exam: There is no evidence of ulcer or pre-ulcerative changes or infection. Orthopedic Exam: Muscle tone and strength are WNL. No limitations in general ROM. No crepitus or effusions noted. Foot type and digits show no abnormalities. Bony prominences are unremarkable. Skin: No Porokeratosis. No infection or ulcers  Diagnosis:  Onychomycosis, , Pain in right toe, pain in left toes  Treatment & Plan Procedures and Treatment: Consent by patient was obtained for treatment procedures. The patient understood the discussion of treatment and procedures well. All questions were answered thoroughly reviewed. Debridement of mycotic and hypertrophic toenails, 1 through 5 bilateral and clearing of subungual debris.  Return Visit-Office Procedure: Patient instructed to return to the office for a follow up visit 10 weeks  for continued evaluation and treatment.    Gardiner Barefoot DPM

## 2017-06-24 DIAGNOSIS — I129 Hypertensive chronic kidney disease with stage 1 through stage 4 chronic kidney disease, or unspecified chronic kidney disease: Secondary | ICD-10-CM | POA: Diagnosis not present

## 2017-06-24 DIAGNOSIS — Z1389 Encounter for screening for other disorder: Secondary | ICD-10-CM | POA: Diagnosis not present

## 2017-06-24 DIAGNOSIS — N183 Chronic kidney disease, stage 3 (moderate): Secondary | ICD-10-CM | POA: Diagnosis not present

## 2017-06-28 DIAGNOSIS — R296 Repeated falls: Secondary | ICD-10-CM | POA: Diagnosis not present

## 2017-06-28 DIAGNOSIS — E782 Mixed hyperlipidemia: Secondary | ICD-10-CM | POA: Diagnosis not present

## 2017-06-28 DIAGNOSIS — Z741 Need for assistance with personal care: Secondary | ICD-10-CM | POA: Diagnosis not present

## 2017-06-28 DIAGNOSIS — H5440 Blindness, one eye, unspecified eye: Secondary | ICD-10-CM | POA: Diagnosis not present

## 2017-06-28 DIAGNOSIS — I129 Hypertensive chronic kidney disease with stage 1 through stage 4 chronic kidney disease, or unspecified chronic kidney disease: Secondary | ICD-10-CM | POA: Diagnosis not present

## 2017-06-28 DIAGNOSIS — N08 Glomerular disorders in diseases classified elsewhere: Secondary | ICD-10-CM | POA: Diagnosis not present

## 2017-06-28 DIAGNOSIS — N183 Chronic kidney disease, stage 3 (moderate): Secondary | ICD-10-CM | POA: Diagnosis not present

## 2017-07-08 ENCOUNTER — Other Ambulatory Visit: Payer: Self-pay | Admitting: Cardiology

## 2017-08-22 IMAGING — DX DG CHEST 2V
2 series · 2 of 2 positions shown · non-contrast
Comparison: Chest x-ray a 05/02/2016.

CLINICAL DATA: 81-year-old male with history of palpitations and
rapid heart rate for 1 day.

EXAM:
CHEST  2 VIEW

[chest pa]
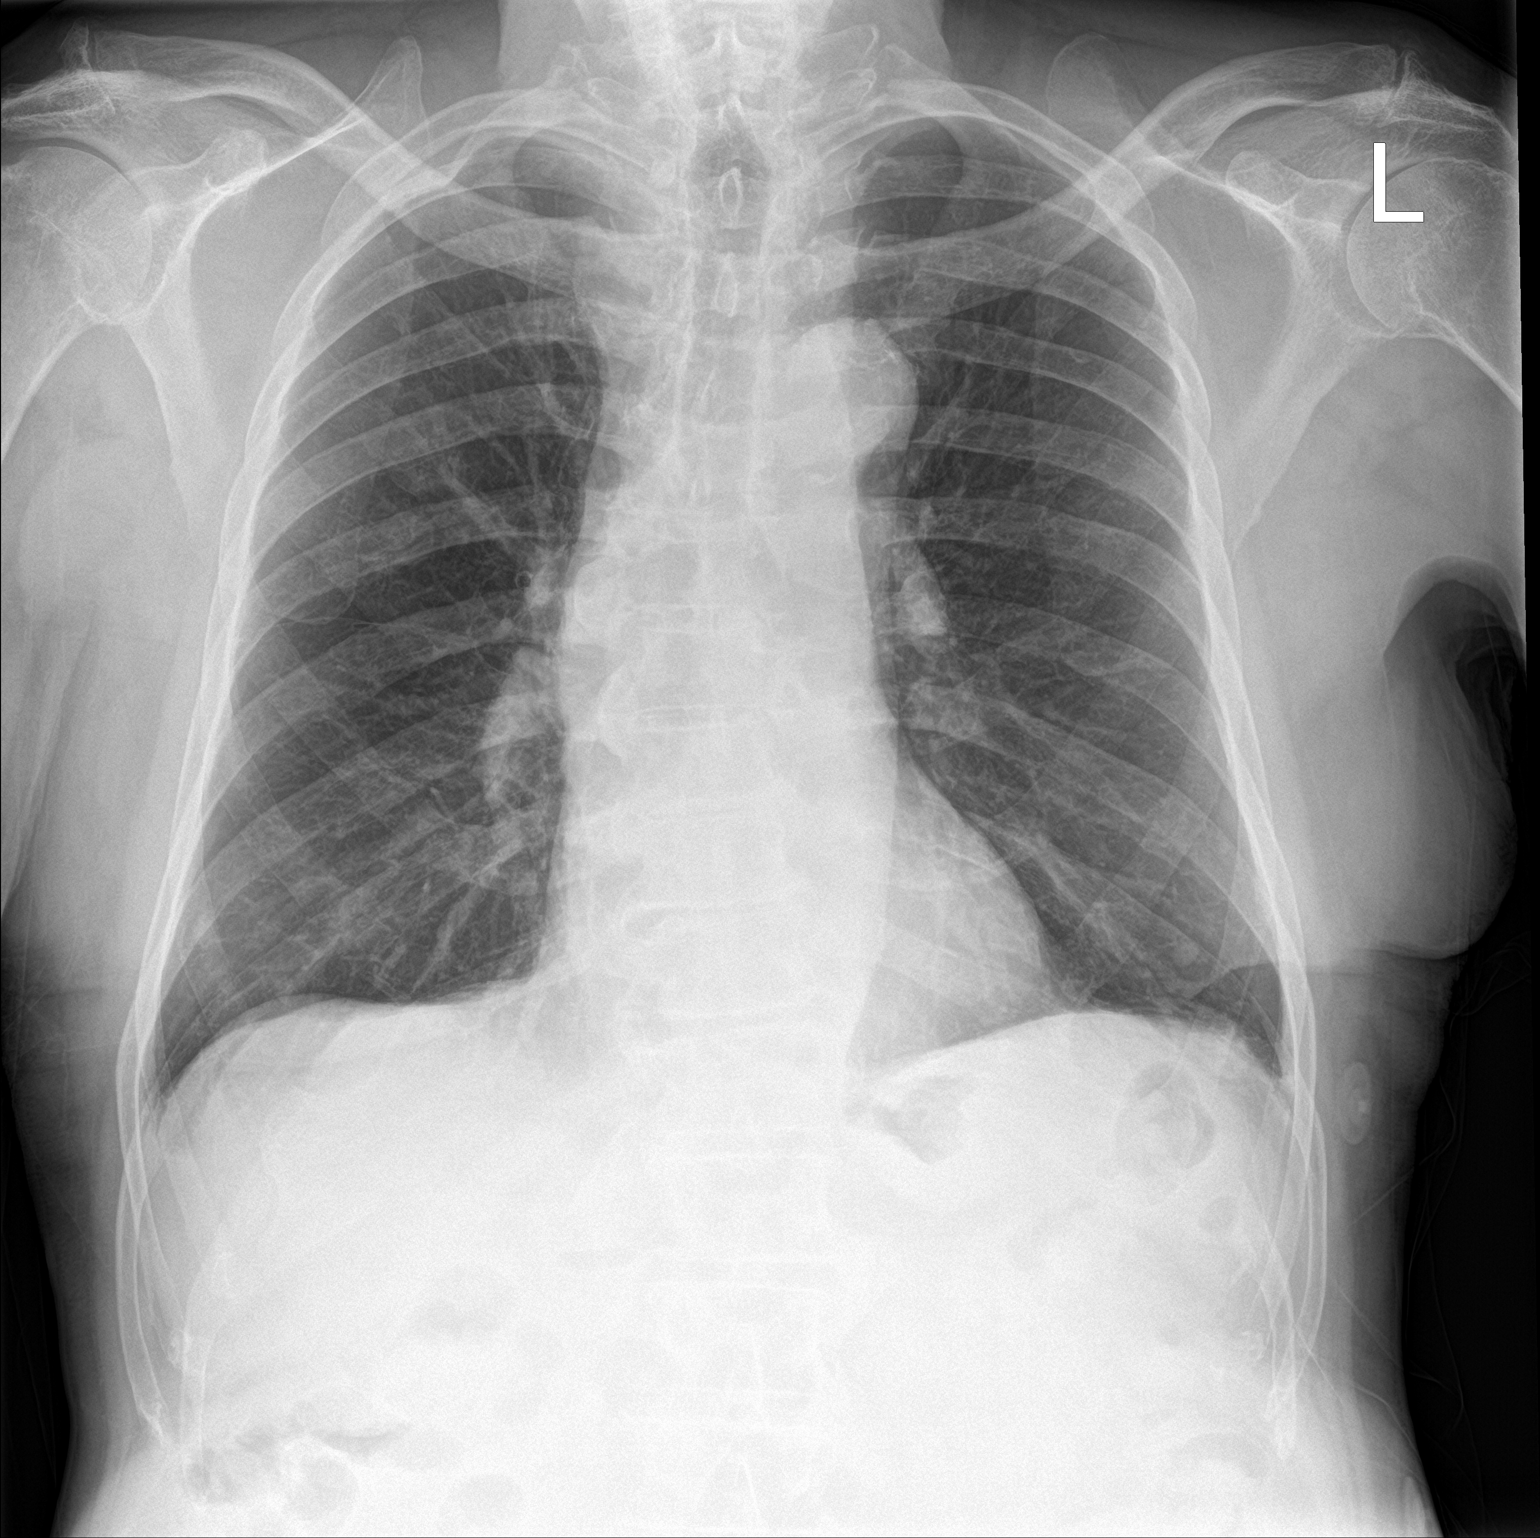

[chest lat]
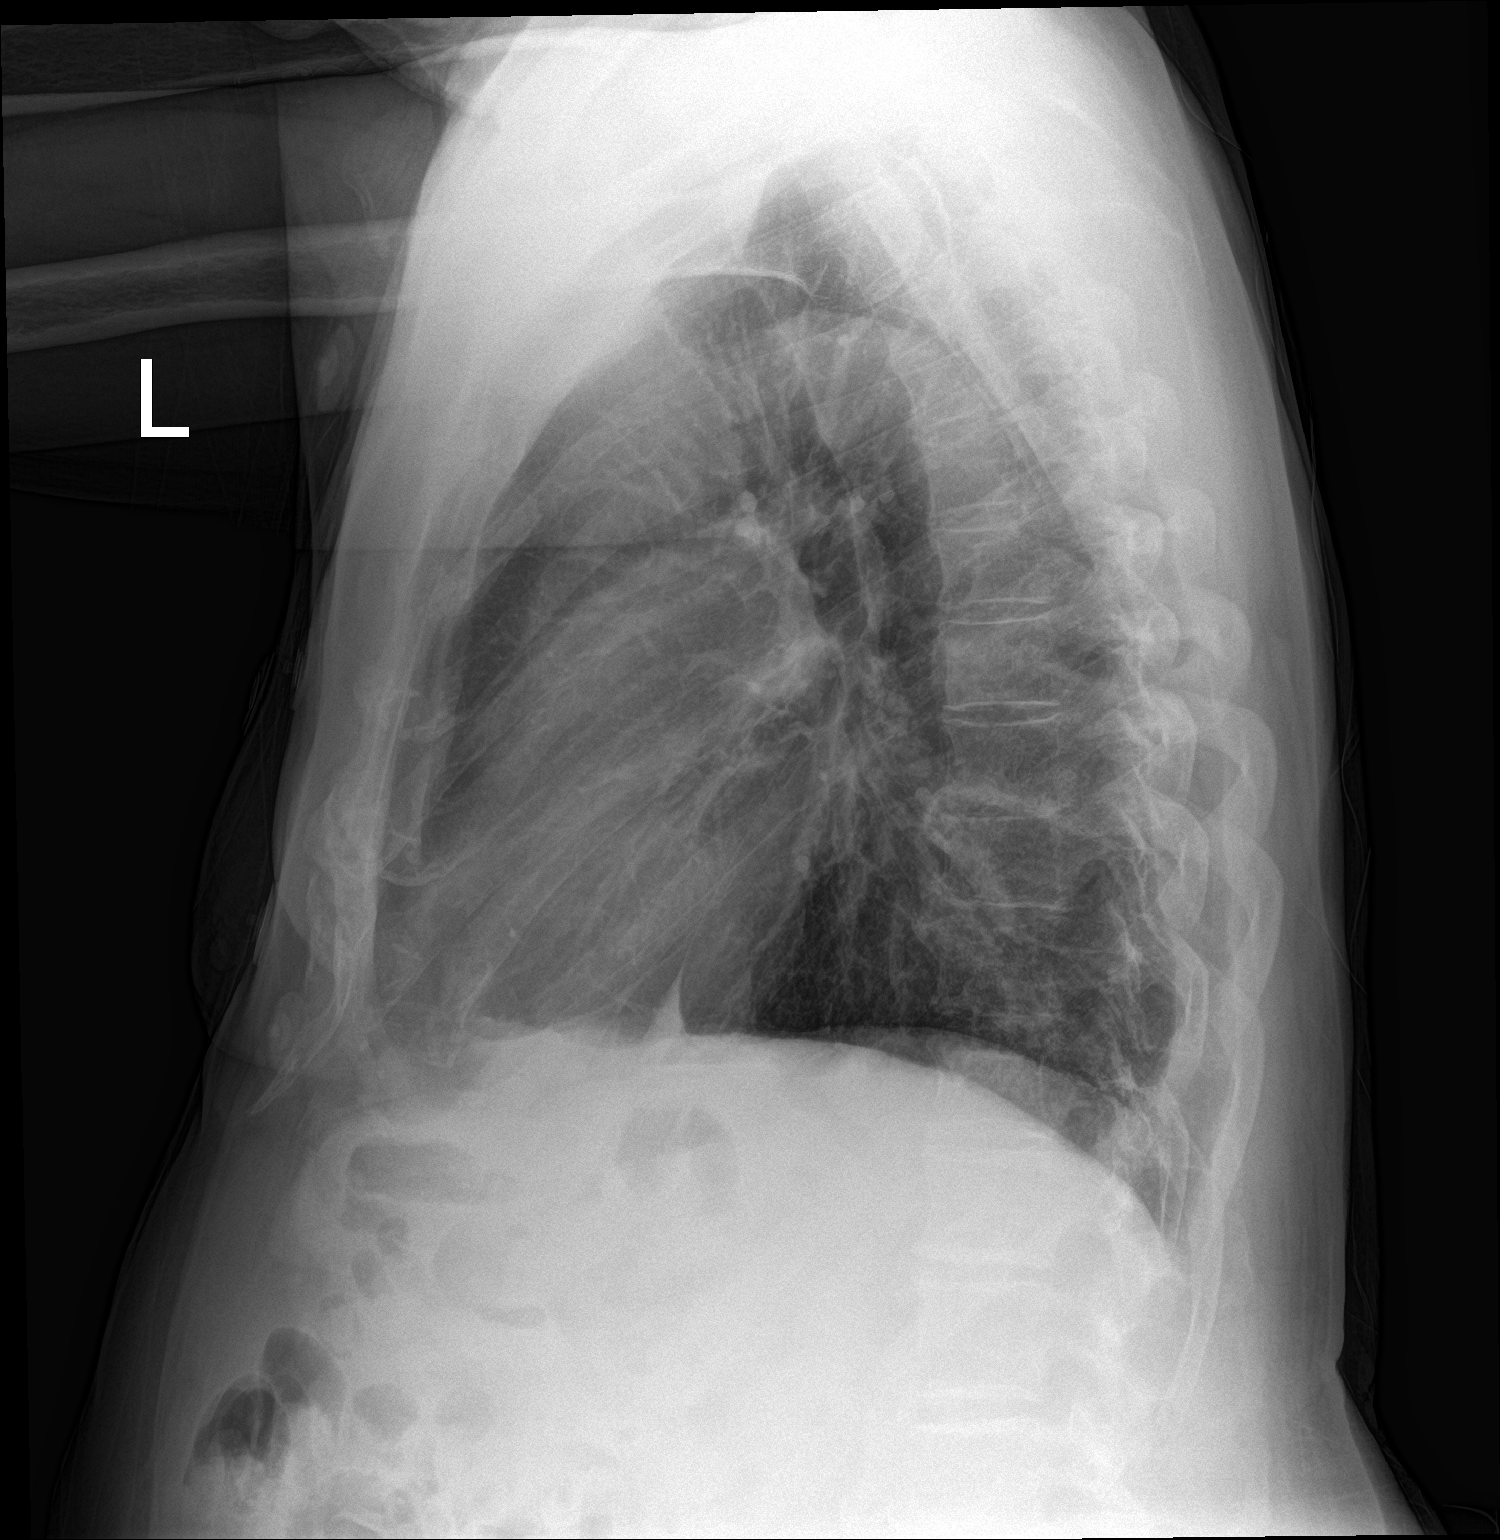

[2 of 2 positions shown; findings below may reference images not displayed]

FINDINGS: Lung volumes are normal. No consolidative airspace disease. No
pleural effusions. No pneumothorax. No pulmonary nodule or mass
noted. Pulmonary vasculature and the cardiomediastinal silhouette
are within normal limits. Atherosclerosis in the thoracic aorta.
IMPRESSION: 1.  No radiographic evidence of acute cardiopulmonary disease.
2. Aortic atherosclerosis.

## 2017-08-22 IMAGING — CT CT HEAD W/O CM
3 of 4 series · 18 of 47 positions shown, 21 images · non-contrast
Comparison: CT head dated 02/05/2015, CT head dated 07/08/2014 and
MRI brain dated 02/23/2014.

CLINICAL DATA: Headache and blurry vision, palpitations.

EXAM:
CT HEAD WITHOUT CONTRAST
TECHNIQUE: Contiguous axial images were obtained from the base of the skull
through the vertex without intravenous contrast.

[Series 201: head w/o, idose (1) · axial · non-contrast · 0.43mm/px · z∈[+598,+718]mm · 12 of 29 slices shown, 15 images]
[im 3/29  brain]
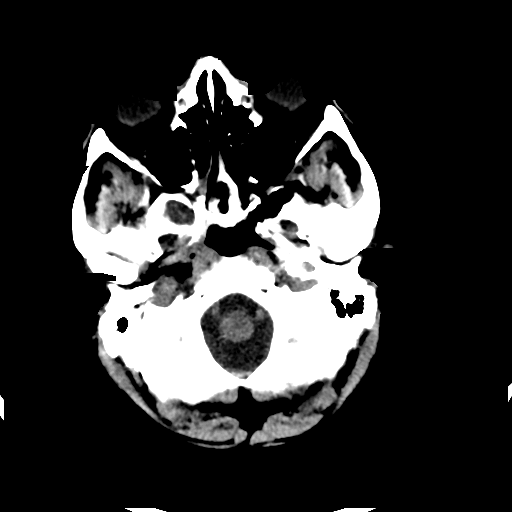
[im 3/29  bone]
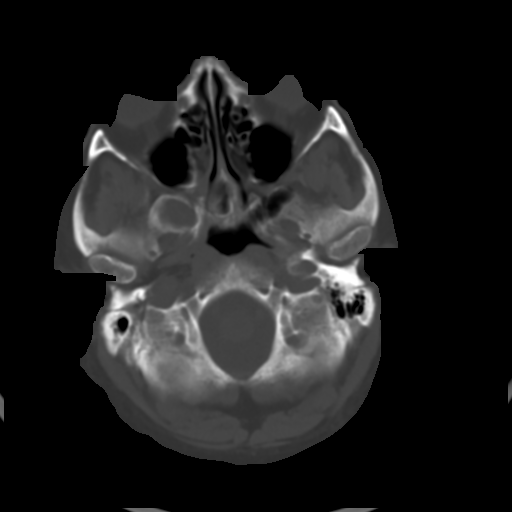
[im 5/29  brain]
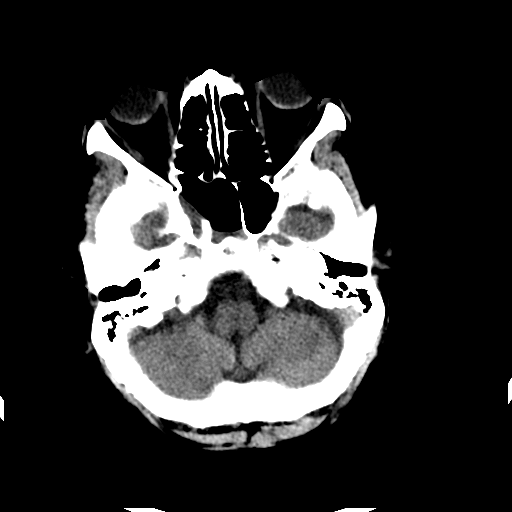
[im 7/29  brain]
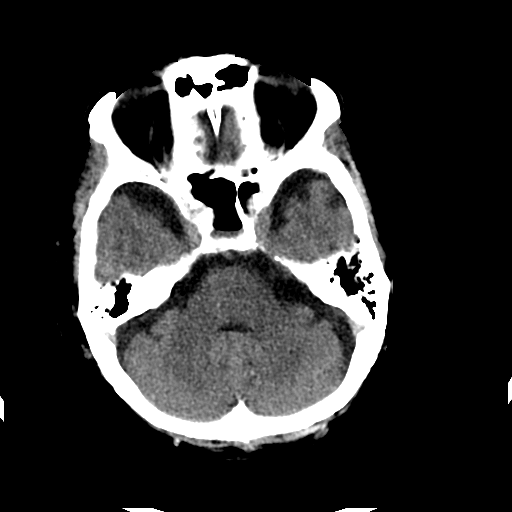
[im 9/29  brain]
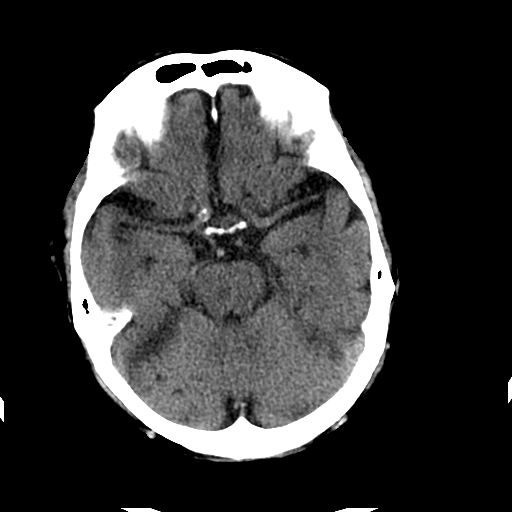
[im 11/29  brain]
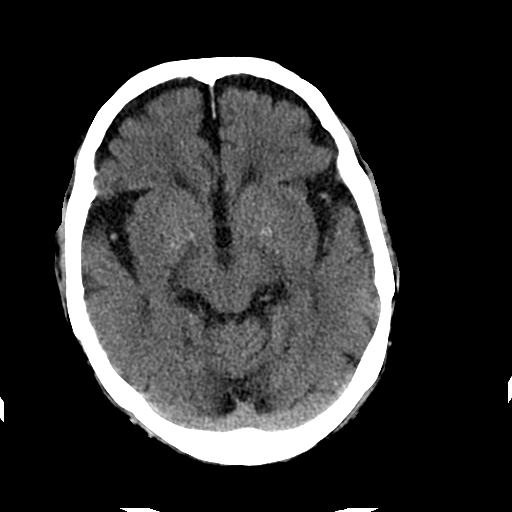
[im 11/29  bone]
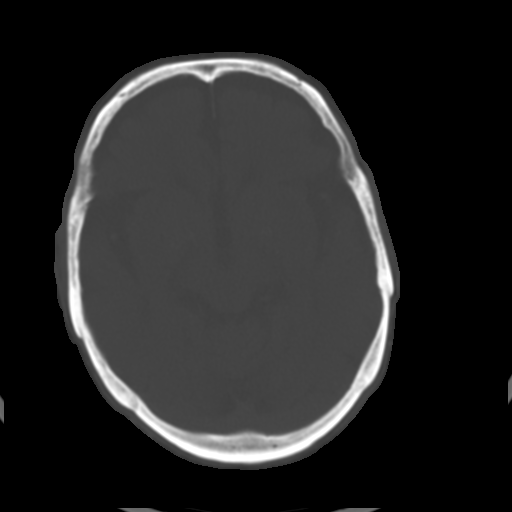
[im 13/29  brain]
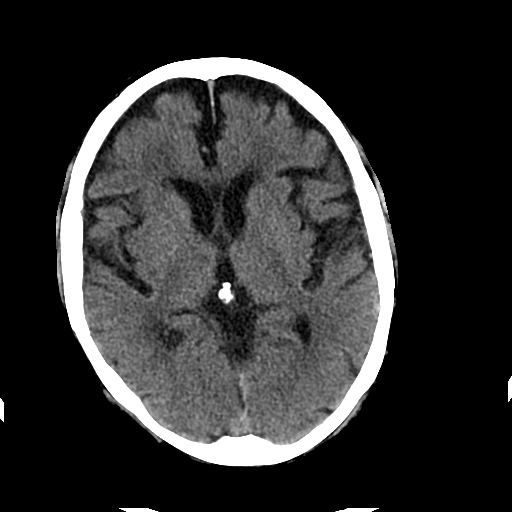
[im 17/29  brain]
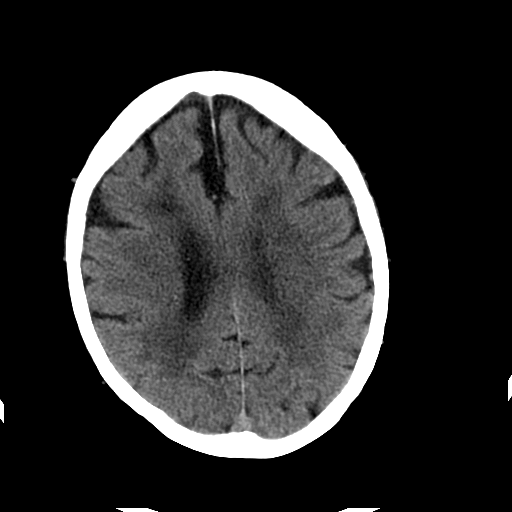
[im 19/29  brain]
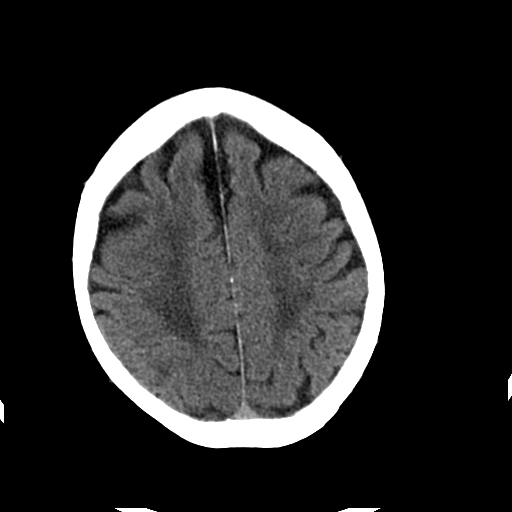
[im 21/29  brain]
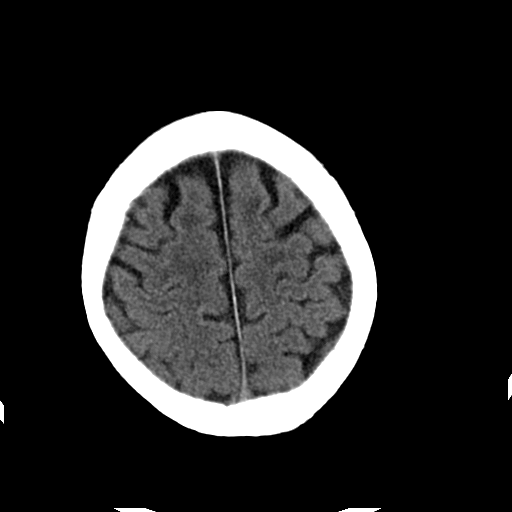
[im 21/29  bone]
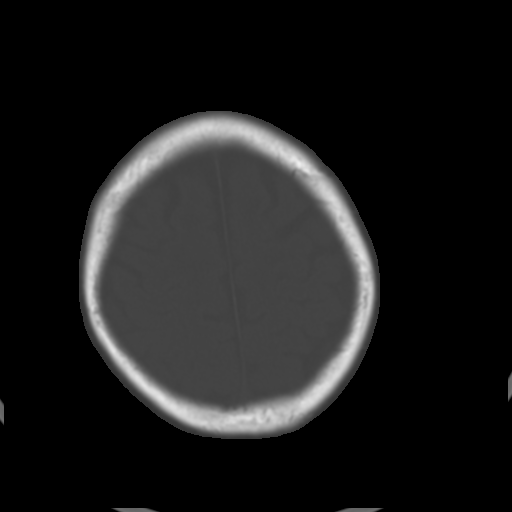
[im 23/29  brain]
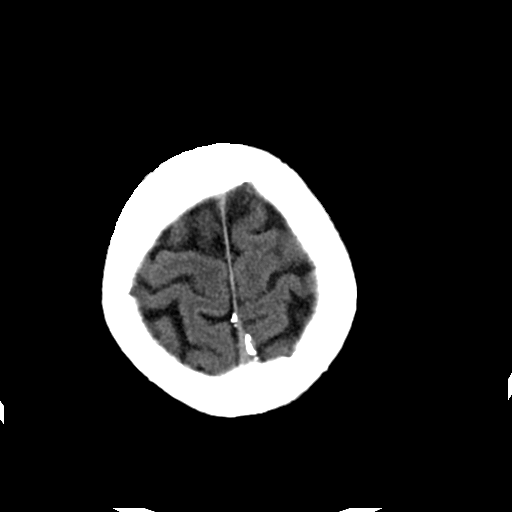
[im 25/29  brain]
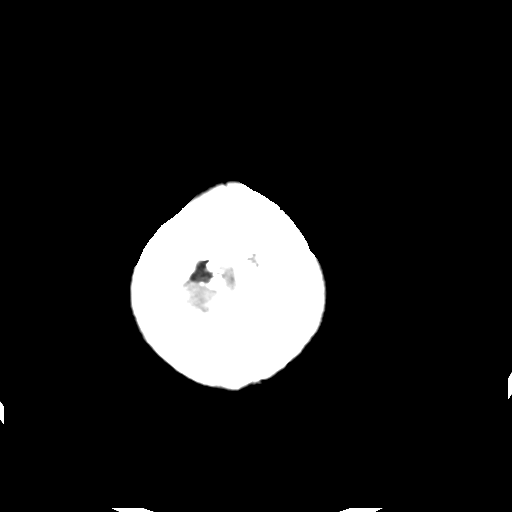
[im 27/29  brain]
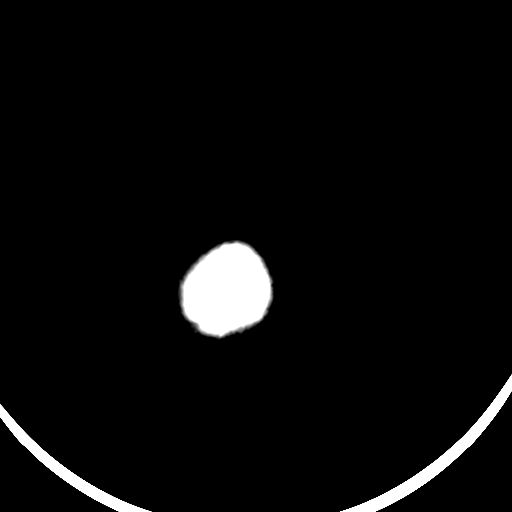

[Series 203: coronal st, idose (1) · coronal · 0.40mm/px · 3 of 67 slices shown]
[im 23/67  brain]
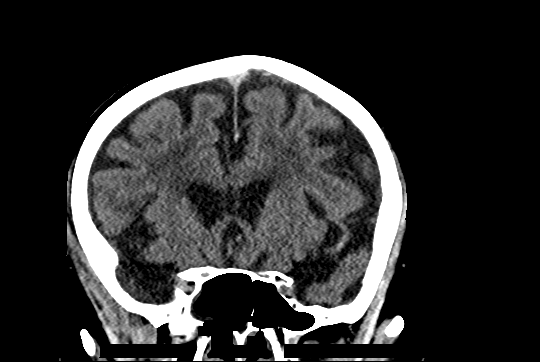
[im 30/67  brain]
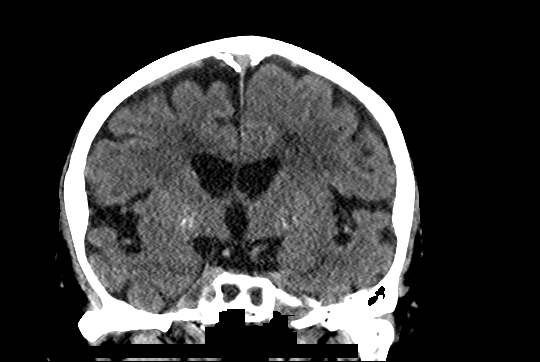
[im 37/67  brain]
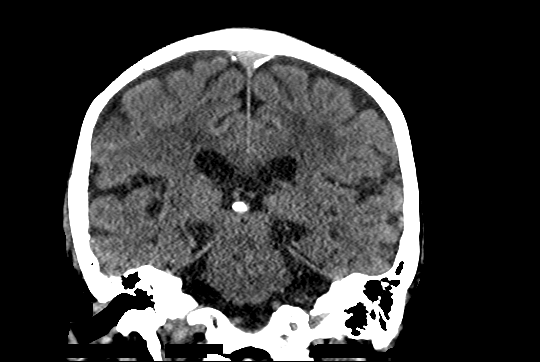

[Series 204: sagittal st, idose (1) · sagittal · 0.40mm/px · 3 of 72 slices shown]
[im 24/72  brain]
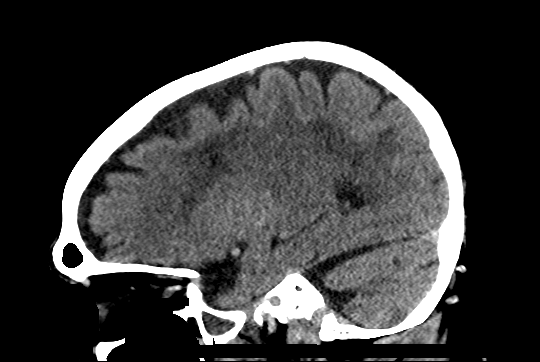
[im 36/72  brain]
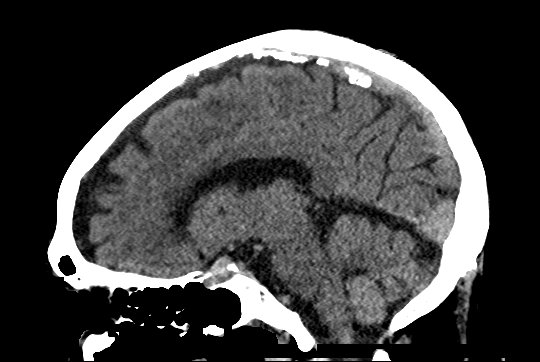
[im 48/72  brain]
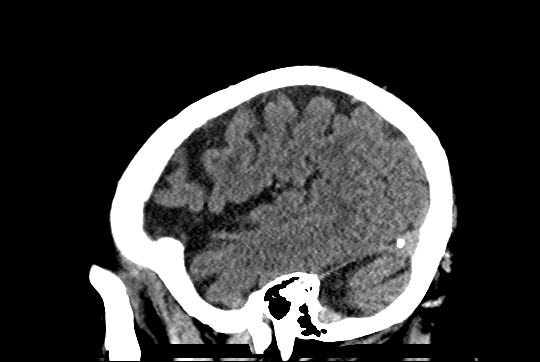

[18 of 47 positions shown; findings below may reference images not displayed]

FINDINGS: Brain: Again noted is mild generalized age related parenchymal
atrophy with commensurate dilatation of the ventricles and sulci. No
hydrocephalus. Chronic small vessel ischemic changes again noted
within the bilateral periventricular and subcortical white matter
regions.

There is no mass, hemorrhage, edema or other evidence of acute
parenchymal abnormality. No extra-axial hemorrhage.

Vascular: No hyperdense vessel or unexpected calcification.

Skull: Normal. Negative for fracture or focal lesion.

Sinuses/Orbits: New air-fluid level within the sphenoid sinus.
Visualized upper paranasal sinuses otherwise clear. Periorbital and
retro-orbital soft tissues are unremarkable.

Other: None.
IMPRESSION: 1. No acute intracranial abnormality. No intracranial mass,
hemorrhage or edema.
2. Stable atrophy and chronic ischemic changes in the white matter.
3. Sphenoid sinusitis, new compared to the previous exam.

## 2017-09-03 ENCOUNTER — Ambulatory Visit: Payer: Medicare Other | Admitting: Podiatry

## 2017-09-17 ENCOUNTER — Other Ambulatory Visit: Payer: Self-pay | Admitting: Internal Medicine

## 2017-09-18 ENCOUNTER — Other Ambulatory Visit: Payer: Self-pay | Admitting: Internal Medicine

## 2017-09-18 ENCOUNTER — Other Ambulatory Visit: Payer: Self-pay | Admitting: Cardiology

## 2017-09-22 ENCOUNTER — Other Ambulatory Visit: Payer: Self-pay | Admitting: Internal Medicine

## 2017-10-05 ENCOUNTER — Ambulatory Visit (INDEPENDENT_AMBULATORY_CARE_PROVIDER_SITE_OTHER): Payer: Medicare Other | Admitting: Podiatry

## 2017-10-05 ENCOUNTER — Encounter: Payer: Self-pay | Admitting: Podiatry

## 2017-10-05 DIAGNOSIS — M79676 Pain in unspecified toe(s): Secondary | ICD-10-CM | POA: Diagnosis not present

## 2017-10-05 DIAGNOSIS — B351 Tinea unguium: Secondary | ICD-10-CM

## 2017-10-05 DIAGNOSIS — M79609 Pain in unspecified limb: Secondary | ICD-10-CM

## 2017-10-05 NOTE — Progress Notes (Signed)
Complaint:  Visit Type: Patient returns to my office for continued preventative foot care services. Complaint: Patient states" my nails have grown long and thick and become painful to walk and wear shoes" . The patient presents for preventative foot care services. No changes to ROS  Podiatric Exam: Vascular: dorsalis pedis and posterior tibial pulses are weakly  palpable bilateral. Capillary return is immediate. Temperature gradient is diminished. Skin turgor WNL  Sensorium: Normal Semmes Weinstein monofilament test. Normal tactile sensation bilaterally. Nail Exam: Pt has thick disfigured discolored nails with subungual debris noted bilateral entire nail hallux through fifth toenails Ulcer Exam: There is no evidence of ulcer or pre-ulcerative changes or infection. Orthopedic Exam: Muscle tone and strength are WNL. No limitations in general ROM. No crepitus or effusions noted. Foot type and digits show no abnormalities. Bony prominences are unremarkable. Skin: No Porokeratosis. No infection or ulcers  Diagnosis:  Onychomycosis, , Pain in right toe, pain in left toes  Treatment & Plan Procedures and Treatment: Consent by patient was obtained for treatment procedures. The patient understood the discussion of treatment and procedures well. All questions were answered thoroughly reviewed. Debridement of mycotic and hypertrophic toenails, 1 through 5 bilateral and clearing of subungual debris.  Return Visit-Office Procedure: Patient instructed to return to the office for a follow up visit 10 weeks  for continued evaluation and treatment.    Gardiner Barefoot DPM

## 2017-10-19 DIAGNOSIS — H401133 Primary open-angle glaucoma, bilateral, severe stage: Secondary | ICD-10-CM | POA: Diagnosis not present

## 2017-10-27 DIAGNOSIS — E782 Mixed hyperlipidemia: Secondary | ICD-10-CM | POA: Diagnosis not present

## 2017-10-27 DIAGNOSIS — N08 Glomerular disorders in diseases classified elsewhere: Secondary | ICD-10-CM | POA: Diagnosis not present

## 2017-10-27 DIAGNOSIS — N183 Chronic kidney disease, stage 3 (moderate): Secondary | ICD-10-CM | POA: Diagnosis not present

## 2017-10-27 DIAGNOSIS — I129 Hypertensive chronic kidney disease with stage 1 through stage 4 chronic kidney disease, or unspecified chronic kidney disease: Secondary | ICD-10-CM | POA: Diagnosis not present

## 2017-11-09 DIAGNOSIS — H26491 Other secondary cataract, right eye: Secondary | ICD-10-CM | POA: Diagnosis not present

## 2017-12-07 DIAGNOSIS — H25812 Combined forms of age-related cataract, left eye: Secondary | ICD-10-CM | POA: Diagnosis not present

## 2017-12-07 DIAGNOSIS — H401133 Primary open-angle glaucoma, bilateral, severe stage: Secondary | ICD-10-CM | POA: Diagnosis not present

## 2017-12-14 ENCOUNTER — Ambulatory Visit (INDEPENDENT_AMBULATORY_CARE_PROVIDER_SITE_OTHER): Payer: Medicare Other | Admitting: Podiatry

## 2017-12-14 ENCOUNTER — Encounter: Payer: Self-pay | Admitting: Podiatry

## 2017-12-14 DIAGNOSIS — B351 Tinea unguium: Secondary | ICD-10-CM | POA: Diagnosis not present

## 2017-12-14 DIAGNOSIS — M79676 Pain in unspecified toe(s): Secondary | ICD-10-CM

## 2017-12-14 DIAGNOSIS — M79609 Pain in unspecified limb: Secondary | ICD-10-CM

## 2017-12-14 DIAGNOSIS — I739 Peripheral vascular disease, unspecified: Secondary | ICD-10-CM | POA: Diagnosis not present

## 2017-12-14 NOTE — Progress Notes (Signed)
Complaint:  Visit Type: Patient returns to my office for continued preventative foot care services. Complaint: Patient states" my nails have grown long and thick and become painful to walk and wear shoes" . The patient presents for preventative foot care services. No changes to ROS  Podiatric Exam: Vascular: dorsalis pedis and posterior tibial pulses are weakly  palpable bilateral. Capillary return is immediate. Temperature gradient is diminished. Skin turgor WNL  Sensorium: Normal Semmes Weinstein monofilament test. Normal tactile sensation bilaterally. Nail Exam: Pt has thick disfigured discolored nails with subungual debris noted bilateral entire nail hallux through fifth toenails Ulcer Exam: There is no evidence of ulcer or pre-ulcerative changes or infection. Orthopedic Exam: Muscle tone and strength are WNL. No limitations in general ROM. No crepitus or effusions noted. Foot type and digits show no abnormalities. Bony prominences are unremarkable. Skin: No Porokeratosis. No infection or ulcers  Diagnosis:  Onychomycosis, , Pain in right toe, pain in left toes  Treatment & Plan Procedures and Treatment: Consent by patient was obtained for treatment procedures. The patient understood the discussion of treatment and procedures well. All questions were answered thoroughly reviewed. Debridement of mycotic and hypertrophic toenails, 1 through 5 bilateral and clearing of subungual debris.  Return Visit-Office Procedure: Patient instructed to return to the office for a follow up visit 9 weeks  for continued evaluation and treatment.    Azeem Poorman DPM 

## 2018-02-18 ENCOUNTER — Ambulatory Visit: Payer: Medicare Other | Admitting: Podiatry

## 2018-03-09 ENCOUNTER — Ambulatory Visit: Payer: Medicare Other | Admitting: Podiatry

## 2018-03-15 ENCOUNTER — Ambulatory Visit (INDEPENDENT_AMBULATORY_CARE_PROVIDER_SITE_OTHER): Payer: Medicare Other | Admitting: Podiatry

## 2018-03-15 ENCOUNTER — Encounter: Payer: Self-pay | Admitting: Podiatry

## 2018-03-15 DIAGNOSIS — B351 Tinea unguium: Secondary | ICD-10-CM | POA: Diagnosis not present

## 2018-03-15 DIAGNOSIS — M79609 Pain in unspecified limb: Secondary | ICD-10-CM

## 2018-03-15 DIAGNOSIS — I739 Peripheral vascular disease, unspecified: Secondary | ICD-10-CM

## 2018-03-15 NOTE — Progress Notes (Signed)
Complaint:  Visit Type: Patient returns to my office for continued preventative foot care services. Complaint: Patient states" my nails have grown long and thick and become painful to walk and wear shoes" . The patient presents for preventative foot care services. No changes to ROS  Podiatric Exam: Vascular: dorsalis pedis and posterior tibial pulses are weakly  palpable bilateral. Capillary return is immediate. Temperature gradient is diminished. Skin turgor WNL  Sensorium: Normal Semmes Weinstein monofilament test. Normal tactile sensation bilaterally. Nail Exam: Pt has thick disfigured discolored nails with subungual debris noted bilateral entire nail hallux through fifth toenails Ulcer Exam: There is no evidence of ulcer or pre-ulcerative changes or infection. Orthopedic Exam: Muscle tone and strength are WNL. No limitations in general ROM. No crepitus or effusions noted. Foot type and digits show no abnormalities. Bony prominences are unremarkable. Skin: No Porokeratosis. No infection or ulcers  Diagnosis:  Onychomycosis, , Pain in right toe, pain in left toes  Treatment & Plan Procedures and Treatment: Consent by patient was obtained for treatment procedures. The patient understood the discussion of treatment and procedures well. All questions were answered thoroughly reviewed. Debridement of mycotic and hypertrophic toenails, 1 through 5 bilateral and clearing of subungual debris.  Return Visit-Office Procedure: Patient instructed to return to the office for a follow up visit 9 weeks  for continued evaluation and treatment.    Gardiner Barefoot DPM

## 2018-06-15 ENCOUNTER — Encounter: Payer: Self-pay | Admitting: Podiatry

## 2018-06-15 ENCOUNTER — Ambulatory Visit (INDEPENDENT_AMBULATORY_CARE_PROVIDER_SITE_OTHER): Payer: Medicare Other | Admitting: Podiatry

## 2018-06-15 DIAGNOSIS — I739 Peripheral vascular disease, unspecified: Secondary | ICD-10-CM

## 2018-06-15 DIAGNOSIS — B351 Tinea unguium: Secondary | ICD-10-CM | POA: Diagnosis not present

## 2018-06-15 DIAGNOSIS — M79676 Pain in unspecified toe(s): Secondary | ICD-10-CM

## 2018-06-15 DIAGNOSIS — M79609 Pain in unspecified limb: Secondary | ICD-10-CM

## 2018-06-15 NOTE — Progress Notes (Signed)
Complaint:  Visit Type: Patient returns to my office for continued preventative foot care services. Complaint: Patient states" my nails have grown long and thick and become painful to walk and wear shoes" . The patient presents for preventative foot care services. No changes to ROS  Podiatric Exam: Vascular: dorsalis pedis and posterior tibial pulses are weakly  palpable bilateral. Capillary return is immediate. Temperature gradient is diminished. Skin turgor WNL  Sensorium: Normal Semmes Weinstein monofilament test. Normal tactile sensation bilaterally. Nail Exam: Pt has thick disfigured discolored nails with subungual debris noted bilateral entire nail hallux through fifth toenails Ulcer Exam: There is no evidence of ulcer or pre-ulcerative changes or infection. Orthopedic Exam: Muscle tone and strength are WNL. No limitations in general ROM. No crepitus or effusions noted. Foot type and digits show no abnormalities. HAV  B/L. Skin: No Porokeratosis. No infection or ulcers  Diagnosis:  Onychomycosis, , Pain in right toe, pain in left toes  Treatment & Plan Procedures and Treatment: Consent by patient was obtained for treatment procedures. The patient understood the discussion of treatment and procedures well. All questions were answered thoroughly reviewed. Debridement of mycotic and hypertrophic toenails, 1 through 5 bilateral and clearing of subungual debris.  Return Visit-Office Procedure: Patient instructed to return to the office for a follow up visit 9 weeks  for continued evaluation and treatment.    Gardiner Barefoot DPM

## 2018-06-29 ENCOUNTER — Ambulatory Visit: Payer: Medicare Other | Admitting: Internal Medicine

## 2018-06-29 ENCOUNTER — Ambulatory Visit: Payer: Medicare Other

## 2018-06-29 ENCOUNTER — Ambulatory Visit: Payer: Medicare Other | Admitting: Nurse Practitioner

## 2018-07-22 ENCOUNTER — Ambulatory Visit (INDEPENDENT_AMBULATORY_CARE_PROVIDER_SITE_OTHER): Payer: Medicare Other | Admitting: Nurse Practitioner

## 2018-07-22 ENCOUNTER — Ambulatory Visit (INDEPENDENT_AMBULATORY_CARE_PROVIDER_SITE_OTHER): Payer: Medicare Other

## 2018-07-22 VITALS — BP 130/80 | HR 56 | Temp 97.6°F | Ht 62.0 in | Wt 155.4 lb

## 2018-07-22 VITALS — BP 130/80 | HR 56 | Temp 97.6°F | Ht 65.2 in | Wt 155.4 lb

## 2018-07-22 DIAGNOSIS — N183 Chronic kidney disease, stage 3 unspecified: Secondary | ICD-10-CM

## 2018-07-22 DIAGNOSIS — M1A9XX0 Chronic gout, unspecified, without tophus (tophi): Secondary | ICD-10-CM | POA: Diagnosis not present

## 2018-07-22 DIAGNOSIS — L609 Nail disorder, unspecified: Secondary | ICD-10-CM | POA: Insufficient documentation

## 2018-07-22 DIAGNOSIS — Z0000 Encounter for general adult medical examination without abnormal findings: Secondary | ICD-10-CM

## 2018-07-22 DIAGNOSIS — E785 Hyperlipidemia, unspecified: Secondary | ICD-10-CM | POA: Diagnosis not present

## 2018-07-22 DIAGNOSIS — I10 Essential (primary) hypertension: Secondary | ICD-10-CM

## 2018-07-22 DIAGNOSIS — I129 Hypertensive chronic kidney disease with stage 1 through stage 4 chronic kidney disease, or unspecified chronic kidney disease: Secondary | ICD-10-CM

## 2018-07-22 DIAGNOSIS — R7303 Prediabetes: Secondary | ICD-10-CM | POA: Diagnosis not present

## 2018-07-22 DIAGNOSIS — D649 Anemia, unspecified: Secondary | ICD-10-CM | POA: Diagnosis not present

## 2018-07-22 NOTE — Patient Instructions (Signed)
Trevor Murphy , Thank you for taking time to come for your Medicare Wellness Visit. I appreciate your ongoing commitment to your health goals. Please review the following plan we discussed and let me know if I can assist you in the future.   Screening recommendations/referrals: Colonoscopy: not required Recommended yearly ophthalmology/optometry visit for glaucoma screening and checkup Recommended yearly dental visit for hygiene and checkup  Vaccinations: Influenza vaccine: declined Pneumococcal vaccine: declined Tdap vaccine: 06/21/2013 Shingles vaccine: declined    Advanced directives: Advance directive discussed with you today. Even though you declined this today please call our office should you change your mind and we can give you the proper paperwork for you to fill out.   Conditions/risks identified: Overweight  Next appointment: 11/21/2018 at 10:15  Preventive Care 11 Years and Older, Male Preventive care refers to lifestyle choices and visits with your health care provider that can promote health and wellness. What does preventive care include?  A yearly physical exam. This is also called an annual well check.  Dental exams once or twice a year.  Routine eye exams. Ask your health care provider how often you should have your eyes checked.  Personal lifestyle choices, including:  Daily care of your teeth and gums.  Regular physical activity.  Eating a healthy diet.  Avoiding tobacco and drug use.  Limiting alcohol use.  Practicing safe sex.  Taking low doses of aspirin every day.  Taking vitamin and mineral supplements as recommended by your health care provider. What happens during an annual well check? The services and screenings done by your health care provider during your annual well check will depend on your age, overall health, lifestyle risk factors, and family history of disease. Counseling  Your health care provider may ask you questions about  your:  Alcohol use.  Tobacco use.  Drug use.  Emotional well-being.  Home and relationship well-being.  Sexual activity.  Eating habits.  History of falls.  Memory and ability to understand (cognition).  Work and work Statistician. Screening  You may have the following tests or measurements:  Height, weight, and BMI.  Blood pressure.  Lipid and cholesterol levels. These may be checked every 5 years, or more frequently if you are over 13 years old.  Skin check.  Lung cancer screening. You may have this screening every year starting at age 60 if you have a 30-pack-year history of smoking and currently smoke or have quit within the past 15 years.  Fecal occult blood test (FOBT) of the stool. You may have this test every year starting at age 46.  Flexible sigmoidoscopy or colonoscopy. You may have a sigmoidoscopy every 5 years or a colonoscopy every 10 years starting at age 33.  Prostate cancer screening. Recommendations will vary depending on your family history and other risks.  Hepatitis C blood test.  Hepatitis B blood test.  Sexually transmitted disease (STD) testing.  Diabetes screening. This is done by checking your blood sugar (glucose) after you have not eaten for a while (fasting). You may have this done every 1-3 years.  Abdominal aortic aneurysm (AAA) screening. You may need this if you are a current or former smoker.  Osteoporosis. You may be screened starting at age 16 if you are at high risk. Talk with your health care provider about your test results, treatment options, and if necessary, the need for more tests. Vaccines  Your health care provider may recommend certain vaccines, such as:  Influenza vaccine. This is recommended every  year.  Tetanus, diphtheria, and acellular pertussis (Tdap, Td) vaccine. You may need a Td booster every 10 years.  Zoster vaccine. You may need this after age 5.  Pneumococcal 13-valent conjugate (PCV13) vaccine.  One dose is recommended after age 79.  Pneumococcal polysaccharide (PPSV23) vaccine. One dose is recommended after age 78. Talk to your health care provider about which screenings and vaccines you need and how often you need them. This information is not intended to replace advice given to you by your health care provider. Make sure you discuss any questions you have with your health care provider. Document Released: 08/23/2015 Document Revised: 04/15/2016 Document Reviewed: 05/28/2015 Elsevier Interactive Patient Education  2017 [place] Prevention in the Home Falls can cause injuries. They can happen to people of all ages. There are many things you can do to make your home safe and to help prevent falls. What can I do on the outside of my home?  Regularly fix the edges of walkways and driveways and fix any cracks.  Remove anything that might make you trip as you walk through a door, such as a raised step or threshold.  Trim any bushes or trees on the path to your home.  Use bright outdoor lighting.  Clear any walking paths of anything that might make someone trip, such as rocks or tools.  Regularly check to see if handrails are loose or broken. Make sure that both sides of any steps have handrails.  Any raised decks and porches should have guardrails on the edges.  Have any leaves, snow, or ice cleared regularly.  Use sand or salt on walking paths during winter.  Clean up any spills in your garage right away. This includes oil or grease spills. What can I do in the bathroom?  Use night lights.  Install grab bars by the toilet and in the tub and shower. Do not use towel bars as grab bars.  Use non-skid mats or decals in the tub or shower.  If you need to sit down in the shower, use a plastic, non-slip stool.  Keep the floor dry. Clean up any water that spills on the floor as soon as it happens.  Remove soap buildup in the tub or shower regularly.  Attach bath  mats securely with double-sided non-slip rug tape.  Do not have throw rugs and other things on the floor that can make you trip. What can I do in the bedroom?  Use night lights.  Make sure that you have a light by your bed that is easy to reach.  Do not use any sheets or blankets that are too big for your bed. They should not hang down onto the floor.  Have a firm chair that has side arms. You can use this for support while you get dressed.  Do not have throw rugs and other things on the floor that can make you trip. What can I do in the kitchen?  Clean up any spills right away.  Avoid walking on wet floors.  Keep items that you use a lot in easy-to-reach places.  If you need to reach something above you, use a strong step stool that has a grab bar.  Keep electrical cords out of the way.  Do not use floor polish or wax that makes floors slippery. If you must use wax, use non-skid floor wax.  Do not have throw rugs and other things on the floor that can make you trip. What can  I do with my stairs?  Do not leave any items on the stairs.  Make sure that there are handrails on both sides of the stairs and use them. Fix handrails that are broken or loose. Make sure that handrails are as long as the stairways.  Check any carpeting to make sure that it is firmly attached to the stairs. Fix any carpet that is loose or worn.  Avoid having throw rugs at the top or bottom of the stairs. If you do have throw rugs, attach them to the floor with carpet tape.  Make sure that you have a light switch at the top of the stairs and the bottom of the stairs. If you do not have them, ask someone to add them for you. What else can I do to help prevent falls?  Wear shoes that:  Do not have high heels.  Have rubber bottoms.  Are comfortable and fit you well.  Are closed at the toe. Do not wear sandals.  If you use a stepladder:  Make sure that it is fully opened. Do not climb a closed  stepladder.  Make sure that both sides of the stepladder are locked into place.  Ask someone to hold it for you, if possible.  Clearly mark and make sure that you can see:  Any grab bars or handrails.  First and last steps.  Where the edge of each step is.  Use tools that help you move around (mobility aids) if they are needed. These include:  Canes.  Walkers.  Scooters.  Crutches.  Turn on the lights when you go into a dark area. Replace any light bulbs as soon as they burn out.  Set up your furniture so you have a clear path. Avoid moving your furniture around.  If any of your floors are uneven, fix them.  If there are any pets around you, be aware of where they are.  Review your medicines with your doctor. Some medicines can make you feel dizzy. This can increase your chance of falling. Ask your doctor what other things that you can do to help prevent falls. This information is not intended to replace advice given to you by your health care provider. Make sure you discuss any questions you have with your health care provider. Document Released: 05/23/2009 Document Revised: 01/02/2016 Document Reviewed: 08/31/2014 Elsevier Interactive Patient Education  2017 [employer].

## 2018-07-22 NOTE — Progress Notes (Signed)
Subjective:   Trevor Murphy is a 83 y.o. male who presents for Medicare Annual/Subsequent preventive examination.  Review of Systems:  n/a Cardiac Risk Factors include: advanced age (>6men, >72 women);male gender;sedentary lifestyle;hypertension;dyslipidemia     Objective:    Vitals: BP 130/80 (BP Location: Left Arm, Patient Position: Sitting)   Pulse (!) 56   Temp 97.6 F (36.4 C) (Oral)   Ht 5' 5.2" (1.656 m)   Wt 155 lb 6.4 oz (70.5 kg)   BMI 25.70 kg/m   Body mass index is 25.7 kg/m.  Advanced Directives 07/22/2018 05/03/2016 04/14/2016 02/04/2015 07/10/2014 07/07/2014 04/26/2014  Does Patient Have a Medical Advance Directive? No Yes No No No No No  Would patient like information on creating a medical advance directive? No - Patient declined - No - patient declined information - - No - patient declined information -  Pre-existing out of facility DNR order (yellow form or pink MOST form) - - - - - - -    Tobacco Social History   Tobacco Use  Smoking Status Never Smoker  Smokeless Tobacco Never Used     Counseling given: Not Answered   Clinical Intake:  Pre-visit preparation completed: Yes  Pain : No/denies pain Pain Score: 0-No pain     Nutritional Status: BMI 25 -29 Overweight Nutritional Risks: None Diabetes: No  How often do you need to have someone help you when you read instructions, pamphlets, or other written materials from your doctor or pharmacy?: 5 - Always(does not see well) What is the last grade level you completed in school?: 11th grade  Interpreter Needed?: No  Information entered by :: NAllen LPN  Past Medical History:  Diagnosis Date  . Arthritis   . Blindness of left eye   . CAD (coronary artery disease)    a. Cath 08/2009: minimal nonobstructive disease (25% LAD). b. Lexiscan nuc 6/215: EF 54%, no ischemia or infarction.  . Chronic diastolic CHF (congestive heart failure) ([place])    a. Echo 2013: EF 55-60%, mild AI, mild MR. b. Echo 5/15  with EF 60-65%, no WMA, mild LVH, mild AI.  [name] Kitchen CKD (chronic kidney disease) stage 3, GFR 30-59 ml/min (HCC) 07/26/2012   a. Felt likely due to HTN.  . CVA (cerebral infarction) 07/28/2012  . Glaucoma   . Gout   . H/O umbilical hernia repair   . Headache(784.0)    " WHEN MY BLOOD PRESSURE IS UP"  . History of acute renal failure   . History of alcohol abuse   . HTN (hypertension)   . Hypertension    a. Patent renal arteries 2005.  [name] Kitchen Noncompliance   . OA (osteoarthritis)   . Other and unspecified hyperlipidemia   . Poor circulation   . Sinus bradycardia   . SVT (supraventricular tachycardia) ([place])    a. Possible AVNRT.  Got adenosine from EMS in 2015 for SVT.  During 5/15 hospitalization had episode of short R-P tachycardia (possible AVNRT) terminated by adenosine.  b. 04/2016 - recurrent tachycardia (SVT vs. AVNRT), again terminated by Adenosine   Past Surgical History:  Procedure Laterality Date  . APPENDECTOMY    . CARDIAC CATHETERIZATION  11/2003   EF 50-55%  . CARDIAC CATHETERIZATION  08/2009  . CATARACT EXTRACTION EXTRACAPSULAR Right 05/17/2013   Procedure: CATARACT EXTRACTION EXTRACAPSULAR WITH INTRAOCULAR LENS PLACEMENT (IOC) RIGHT EYE;  Surgeon: Marylynn Pearson, MD;  Location: [facility];  Service: Ophthalmology;  Laterality: Right;  . COLONOSCOPY    . HERNIA REPAIR    .  VENTRAL HERNIA REPAIR  03/07/2012   Procedure: LAPAROSCOPIC VENTRAL HERNIA;  Surgeon: Gwenyth Ober, MD;  Location: [facility];  Service: General;  Laterality: N/A;   Family History  Problem Relation Age of Onset  . CVA Mother   . Hypertension Brother   . Hypertension Sister   . Hypertension Brother   . Stroke Sister   . Heart attack Neg Hx    Social History   Socioeconomic History  . Marital status: Married    Spouse name: Not on file  . Number of children: Not on file  . Years of education: Not on file  . Highest education level: Not on file  Occupational History  . Occupation: retired  Scientific laboratory technician  .  Financial resource strain: Not hard at all  . Food insecurity:    Worry: Never true    Inability: Never true  . Transportation needs:    Medical: No    Non-medical: No  Tobacco Use  . Smoking status: Never Smoker  . Smokeless tobacco: Never Used  Substance and Sexual Activity  . Alcohol use: No    Alcohol/week: 1.0 standard drinks    Types: 1 Cans of beer per week  . Drug use: No  . Sexual activity: Not Currently  Lifestyle  . Physical activity:    Days per week: 0 days    Minutes per session: 0 min  . Stress: Not at all  Relationships  . Social connections:    Talks on phone: Not on file    Gets together: Not on file    Attends religious service: Not on file    Active member of club or organization: Not on file    Attends meetings of clubs or organizations: Not on file    Relationship status: Not on file  Other Topics Concern  . Not on file  Social History Narrative  . Not on file    Outpatient Encounter Medications as of 07/22/2018  Medication Sig  . acetaminophen (TYLENOL) 500 MG tablet Take 1,000 mg by mouth every 6 (six) hours as needed for headache.  . allopurinol (ZYLOPRIM) 100 MG tablet Take 100 mg by mouth daily.  [name] Kitchen aspirin EC 81 MG tablet Take 1 tablet (81 mg total) by mouth daily.  . carvedilol (COREG) 6.25 MG tablet Take 1 tablet (6.25 mg total) by mouth 2 (two) times daily with a meal.  . dorzolamide (TRUSOPT) 2 % ophthalmic solution Place 1 drop into both eyes 3 (three) times daily.  . furosemide (LASIX) 20 MG tablet Take 1 tablet (20 mg total) by mouth daily.  . hydrALAZINE (APRESOLINE) 100 MG tablet TAKE 1 TABLET (100 MG TOTAL) BY MOUTH THREE TIMES DAILY. **NEEDS TO CALL FOR OCTOBER APPOINTMENT**  . isosorbide mononitrate (IMDUR) 30 MG 24 hr tablet Take 30 mg by mouth daily.  . nitroGLYCERIN (NITROSTAT) 0.4 MG SL tablet Place 0.4 mg under the tongue every 5 (five) minutes as needed for chest pain. X 3 doses  . potassium chloride (K-DUR,KLOR-CON) 10 MEQ  tablet Take 1 tablet (10 mEq total) by mouth every other day.  . rosuvastatin (CRESTOR) 5 MG tablet Take 1 tablet (5 mg total) by mouth daily.  . Travoprost, BAK Free, (TRAVATAN) 0.004 % SOLN ophthalmic solution Place 1 drop into both eyes at bedtime.  . furosemide (LASIX) 20 MG tablet Take 1 tablet (20 mg total) by mouth every other day. Needs office visit (Patient not taking: Reported on 07/22/2018)  . potassium chloride (K-DUR,KLOR-CON) 10 MEQ tablet  TAKE ONE TABLET   BY MOUTH   EVERY OTHER DAY (Patient not taking: Reported on 07/22/2018)   No facility-administered encounter medications on file as of 07/22/2018.     Activities of Daily Living In your present state of health, do you have any difficulty performing the following activities: 07/22/2018  Hearing? Y  Comment sometimes  Vision? Y  Comment left eye can't see at all, right eye decreased vision  Difficulty concentrating or making decisions? N  Walking or climbing stairs? Y  Comment uses cane. does not have any  Dressing or bathing? Y  Comment aide helps  Doing errands, shopping? Y  Comment always has some one with him  Preparing Food and eating ? Y  Comment uanble to see well  Using the Toilet? N  In the past six months, have you accidently leaked urine? N  Do you have problems with loss of bowel control? N  Managing your Medications? Y  Comment aide helps  Managing your Finances? Y  Comment family assists  Housekeeping or managing your Housekeeping? Y  Comment aide helps  Some recent data might be hidden    Patient Care Team: Glendale Chard, MD as PCP - General (Internal Medicine)   Assessment:   This is a routine wellness examination for Azai.  Exercise Activities and Dietary recommendations Current Exercise Habits: The patient does not participate in regular exercise at present, Exercise limited by: orthopedic condition(s)  Goals   None     Fall Risk Fall Risk  07/22/2018  Falls in the past year? 1    Number falls in past yr: 0  Comment lost balance  Injury with Fall? 1  Comment scrapes  Risk for fall due to : History of fall(s);Impaired mobility;Medication side effect;Impaired balance/gait;Impaired vision  Follow up Falls prevention discussed   Is the patient's home free of loose throw rugs in walkways, pet beds, electrical cords, etc?   yes      Grab bars in the bathroom? yes      Handrails on the stairs?   n/a      Adequate lighting?   yes  Timed Get Up and Go Performed: n/a  Depression Screen PHQ 2/9 Scores 07/22/2018  PHQ - 2 Score 0  PHQ- 9 Score 0    Cognitive Function     6CIT Screen 07/22/2018  What Year? 4 points  What month? 0 points  What time? 0 points  Count back from 20 0 points  Months in reverse 2 points  Repeat phrase 0 points  Total Score 6    There is no immunization history for the selected administration types on file for this patient.  Qualifies for Shingles Vaccine?  yes  Screening Tests Health Maintenance  Topic Date Due  . INFLUENZA VACCINE  08/26/2018 (Originally 03/10/2018)  . PNA vac Low Risk Adult (1 of 2 - PCV13) 07/23/2019 (Originally 03/21/2002)  . HEMOGLOBIN A1C  01/21/2019  . OPHTHALMOLOGY EXAM  04/29/2019  . FOOT EXAM  07/23/2019  . TETANUS/TDAP  06/22/2023   Cancer Screenings: Lung: Low Dose CT Chest recommended if Age 84-80 years, 30 pack-year currently smoking OR have quit w/in 15years. Patient does not qualify. Colorectal: not required  Additional Screenings:  Hepatitis C Screening:due      Plan:    Declined all vaccinations.  I have personally reviewed and noted the following in the patient's chart:   . Medical and social history . Use of alcohol, tobacco or illicit drugs  . Current medications  and supplements . Functional ability and status . Nutritional status . Physical activity . Advanced directives . List of other physicians . Hospitalizations, surgeries, and ER visits in previous 12  months . Vitals . Screenings to include cognitive, depression, and falls . Referrals and appointments  In addition, I have reviewed and discussed with patient certain preventive protocols, quality metrics, and best practice recommendations. A written personalized care plan for preventive services as well as general preventive health recommendations were provided to patient.     Kellie Simmering, LPN  06/09/5944

## 2018-07-22 NOTE — Progress Notes (Signed)
Subjective:     Patient ID: Trevor Murphy , male    DOB: 29-Aug-1934 , 83 y.o.   MRN: [id number]   Chief Complaint  Patient presents with  . Follow-up    medication check  . Hypertension    HPI  Hypertension  This is a chronic problem. The current episode started more than 1 year ago. The problem is unchanged. The problem is controlled. Pertinent negatives include no anxiety, malaise/fatigue or orthopnea. There are no associated agents to hypertension. Risk factors for coronary artery disease include sedentary lifestyle. Past treatments include beta blockers. The current treatment provides no improvement. There are no compliance problems.  There is no history of kidney disease. There is no history of chronic renal disease.     Past Medical History:  Diagnosis Date  . Arthritis   . Blindness of left eye   . CAD (coronary artery disease)    a. Cath 08/2009: minimal nonobstructive disease (25% LAD). b. Lexiscan nuc 6/215: EF 54%, no ischemia or infarction.  . Chronic diastolic CHF (congestive heart failure) ([place])    a. Echo 2013: EF 55-60%, mild AI, mild MR. b. Echo 5/15 with EF 60-65%, no WMA, mild LVH, mild AI.  [name] Kitchen CKD (chronic kidney disease) stage 3, GFR 30-59 ml/min (HCC) 07/26/2012   a. Felt likely due to HTN.  . CVA (cerebral infarction) 07/28/2012  . Glaucoma   . Gout   . H/O umbilical hernia repair   . Headache(784.0)    " WHEN MY BLOOD PRESSURE IS UP"  . History of acute renal failure   . History of alcohol abuse   . HTN (hypertension)   . Hypertension    a. Patent renal arteries 2005.  [name] Kitchen Noncompliance   . OA (osteoarthritis)   . Other and unspecified hyperlipidemia   . Poor circulation   . Sinus bradycardia   . SVT (supraventricular tachycardia) ([place])    a. Possible AVNRT.  Got adenosine from EMS in 2015 for SVT.  During 5/15 hospitalization had episode of short R-P tachycardia (possible AVNRT) terminated by adenosine.  b. 04/2016 - recurrent tachycardia (SVT vs.  AVNRT), again terminated by Adenosine     Family History  Problem Relation Age of Onset  . CVA Mother   . Hypertension Brother   . Hypertension Sister   . Hypertension Brother   . Stroke Sister   . Heart attack Neg Hx      Current Outpatient Medications:  .  acetaminophen (TYLENOL) 500 MG tablet, Take 1,000 mg by mouth every 6 (six) hours as needed for headache., Disp: , Rfl:  .  allopurinol (ZYLOPRIM) 100 MG tablet, Take 100 mg by mouth daily., Disp: , Rfl:  .  aspirin EC 81 MG tablet, Take 1 tablet (81 mg total) by mouth daily., Disp: 90 tablet, Rfl: 3 .  carvedilol (COREG) 6.25 MG tablet, Take 1 tablet (6.25 mg total) by mouth 2 (two) times daily with a meal., Disp: 60 tablet, Rfl: 0 .  dorzolamide (TRUSOPT) 2 % ophthalmic solution, Place 1 drop into both eyes 3 (three) times daily., Disp: , Rfl:  .  furosemide (LASIX) 20 MG tablet, Take 1 tablet (20 mg total) by mouth daily., Disp: 30 tablet, Rfl: 0 .  furosemide (LASIX) 20 MG tablet, Take 1 tablet (20 mg total) by mouth every other day. Needs office visit (Patient not taking: Reported on 07/22/2018), Disp: 15 tablet, Rfl: 0 .  hydrALAZINE (APRESOLINE) 100 MG tablet, TAKE 1 TABLET (100 MG TOTAL)  BY MOUTH THREE TIMES DAILY. **NEEDS TO CALL FOR OCTOBER APPOINTMENT**, Disp: 15 tablet, Rfl: 0 .  isosorbide mononitrate (IMDUR) 30 MG 24 hr tablet, Take 30 mg by mouth daily., Disp: , Rfl:  .  nitroGLYCERIN (NITROSTAT) 0.4 MG SL tablet, Place 0.4 mg under the tongue every 5 (five) minutes as needed for chest pain. X 3 doses, Disp: , Rfl:  .  potassium chloride (K-DUR,KLOR-CON) 10 MEQ tablet, Take 1 tablet (10 mEq total) by mouth every other day., Disp: 15 tablet, Rfl: 0 .  potassium chloride (K-DUR,KLOR-CON) 10 MEQ tablet, TAKE ONE TABLET   BY MOUTH   EVERY OTHER DAY (Patient not taking: Reported on 07/22/2018), Disp: 45 tablet, Rfl: 5 .  rosuvastatin (CRESTOR) 5 MG tablet, Take 1 tablet (5 mg total) by mouth daily., Disp: 90 tablet, Rfl: 3 .   Travoprost, BAK Free, (TRAVATAN) 0.004 % SOLN ophthalmic solution, Place 1 drop into both eyes at bedtime., Disp: , Rfl:    No Known Allergies   Review of Systems  Constitutional: Negative.  Negative for malaise/fatigue.  HENT: Negative.   Eyes: Negative.   Respiratory: Negative.   Cardiovascular: Negative.  Negative for orthopnea.  Gastrointestinal: Negative.   Genitourinary: Negative.   Musculoskeletal: Negative.   Skin: Negative.        Left thumb nail irritation  Allergic/Immunologic: Negative.   Neurological: Negative.   Hematological: Negative.   Psychiatric/Behavioral: Negative.      Today's Vitals   07/22/18 1141  BP: 130/80  Pulse: (!) 56  Temp: 97.6 F (36.4 C)  TempSrc: Oral  Weight: 155 lb 6.4 oz (70.5 kg)  Height: 5\' 2"  (1.575 m)   Body mass index is 28.42 kg/m.   Objective:  Physical Exam Vitals signs reviewed.  Constitutional:      Appearance: Normal appearance.  HENT:     Head: Normocephalic and atraumatic.     Right Ear: Tympanic membrane and ear canal normal.     Left Ear: Tympanic membrane and ear canal normal.     Nose: No congestion or rhinorrhea.     Mouth/Throat:     Mouth: Mucous membranes are moist.     Pharynx: Oropharynx is clear.  Eyes:     Extraocular Movements: Extraocular movements intact.     Conjunctiva/sclera: Conjunctivae normal.     Pupils: Pupils are equal, round, and reactive to light.  Neck:     Musculoskeletal: Normal range of motion.  Cardiovascular:     Rate and Rhythm: Normal rate.  Pulmonary:     Effort: Pulmonary effort is normal.     Breath sounds: Normal breath sounds.  Abdominal:     General: Abdomen is flat. Bowel sounds are normal.     Palpations: Abdomen is soft.  Neurological:     Mental Status: He is alert.         Assessment And Plan:       1. Essential hypertension . B/P is controlled.  [name] Kitchen BCP ordered to check renal function.  . The importance of regular physical activity and dietary  modification was stressed to the patient.   - CMP14 + Anion Gap - Lipid panel  2. CKD (chronic kidney disease) stage 3, GFR 30-59 ml/min (HCC)   3. Dyslipidemia  Chronic, controlled  Continue with current medications - CMP14 + Anion Gap - Lipid panel  4. Chronic gout without tophus, unspecified cause, unspecified site  Chronic, controlled  Continue with current medications  No recent exacerbations - Uric acid  5.  Prediabetes  Chronic, controlled  Continue with current medications  Encouraged to limit intake of sugary foods and drinks - Hemoglobin A1c - CBC no Diff  6. Nail abnormality  Left thumb nail with breakage and slightly yellow discoloration  Encouraged to continue to monitor may need medications to help with fungus  [name], FNP

## 2018-07-23 LAB — CBC
Hematocrit: 38.4 % (ref 37.5–51.0)
Hemoglobin: 11.7 g/dL — ABNORMAL LOW (ref 13.0–17.7)
MCH: 23.6 pg — ABNORMAL LOW (ref 26.6–33.0)
MCHC: 30.5 g/dL — ABNORMAL LOW (ref 31.5–35.7)
MCV: 78 fL — ABNORMAL LOW (ref 79–97)
Platelets: 207 10*3/uL (ref 150–450)
RBC: 4.95 x10E6/uL (ref 4.14–5.80)
RDW: 16.3 % — ABNORMAL HIGH (ref 12.3–15.4)
WBC: 4.3 10*3/uL (ref 3.4–10.8)

## 2018-07-23 LAB — CMP14 + ANION GAP
ALT: 12 IU/L (ref 0–44)
AST: 15 IU/L (ref 0–40)
Albumin/Globulin Ratio: 1.2 (ref 1.2–2.2)
Albumin: 3.6 g/dL (ref 3.5–4.7)
Alkaline Phosphatase: 111 IU/L (ref 39–117)
Anion Gap: 10 mmol/L (ref 10.0–18.0)
BUN/Creatinine Ratio: 11 (ref 10–24)
BUN: 18 mg/dL (ref 8–27)
Bilirubin Total: 0.2 mg/dL (ref 0.0–1.2)
CO2: 20 mmol/L (ref 20–29)
Calcium: 8 mg/dL — ABNORMAL LOW (ref 8.6–10.2)
Chloride: 108 mmol/L — ABNORMAL HIGH (ref 96–106)
Creatinine, Ser: 1.68 mg/dL — ABNORMAL HIGH (ref 0.76–1.27)
GFR calc Af Amer: 43 mL/min/{1.73_m2} — ABNORMAL LOW (ref >59)
GFR calc non Af Amer: 38 mL/min/{1.73_m2} — ABNORMAL LOW (ref >59)
Globulin, Total: 3.1 g/dL (ref 1.5–4.5)
Glucose: 102 mg/dL — ABNORMAL HIGH (ref 65–99)
Potassium: 6.3 mmol/L — ABNORMAL HIGH (ref 3.5–5.2)
Sodium: 138 mmol/L (ref 134–144)
Total Protein: 6.7 g/dL (ref 6.0–8.5)

## 2018-07-23 LAB — HEMOGLOBIN A1C
Est. average glucose Bld gHb Est-mCnc: 120 mg/dL
Hgb A1c MFr Bld: 5.8 % — ABNORMAL HIGH (ref 4.8–5.6)

## 2018-07-23 LAB — LIPID PANEL
Chol/HDL Ratio: 2.6 ratio (ref 0.0–5.0)
Cholesterol, Total: 87 mg/dL — ABNORMAL LOW (ref 100–199)
HDL: 33 mg/dL — ABNORMAL LOW (ref >39)
LDL Calculated: 42 mg/dL (ref 0–99)
Triglycerides: 59 mg/dL (ref 0–149)
VLDL Cholesterol Cal: 12 mg/dL (ref 5–40)

## 2018-07-23 LAB — URIC ACID: Uric Acid: 4.6 mg/dL (ref 3.7–8.6)

## 2018-08-03 LAB — IRON AND TIBC
Iron Saturation: 9 % — CL (ref 15–55)
Iron: 25 ug/dL — ABNORMAL LOW (ref 38–169)
Total Iron Binding Capacity: 284 ug/dL (ref 250–450)
UIBC: 259 ug/dL (ref 111–343)

## 2018-08-03 LAB — FERRITIN: Ferritin: 37 ng/mL (ref 30–400)

## 2018-08-03 LAB — SPECIMEN STATUS REPORT

## 2018-08-07 ENCOUNTER — Encounter: Payer: Self-pay | Admitting: Nurse Practitioner

## 2018-08-09 ENCOUNTER — Telehealth: Payer: Self-pay

## 2018-08-09 NOTE — Telephone Encounter (Signed)
Returned pt's sister call and notified her of his labs. YRL,RMA

## 2018-08-24 ENCOUNTER — Ambulatory Visit (INDEPENDENT_AMBULATORY_CARE_PROVIDER_SITE_OTHER): Payer: Medicare Other | Admitting: Podiatry

## 2018-08-24 ENCOUNTER — Encounter: Payer: Self-pay | Admitting: Podiatry

## 2018-08-24 DIAGNOSIS — B351 Tinea unguium: Secondary | ICD-10-CM | POA: Diagnosis not present

## 2018-08-24 DIAGNOSIS — I739 Peripheral vascular disease, unspecified: Secondary | ICD-10-CM

## 2018-08-24 DIAGNOSIS — M79609 Pain in unspecified limb: Secondary | ICD-10-CM | POA: Diagnosis not present

## 2018-08-24 NOTE — Progress Notes (Signed)
Complaint:  Visit Type: Patient returns to my office for continued preventative foot care services. Complaint: Patient states" my nails have grown long and thick and become painful to walk and wear shoes" . The patient presents for preventative foot care services. No changes to ROS  Podiatric Exam: Vascular: dorsalis pedis and posterior tibial pulses are weakly  palpable bilateral. Capillary return is immediate. Temperature gradient is diminished. Skin turgor WNL  Sensorium: Normal Semmes Weinstein monofilament test. Normal tactile sensation bilaterally. Nail Exam: Pt has thick disfigured discolored nails with subungual debris noted bilateral entire nail hallux through fifth toenails Ulcer Exam: There is no evidence of ulcer or pre-ulcerative changes or infection. Orthopedic Exam: Muscle tone and strength are WNL. No limitations in general ROM. No crepitus or effusions noted. Foot type and digits show no abnormalities. HAV  B/L. Skin: No Porokeratosis. No infection or ulcers  Diagnosis:  Onychomycosis, , Pain in right toe, pain in left toes  Treatment & Plan Procedures and Treatment: Consent by patient was obtained for treatment procedures. The patient understood the discussion of treatment and procedures well. All questions were answered thoroughly reviewed. Debridement of mycotic and hypertrophic toenails, 1 through 5 bilateral and clearing of subungual debris. ABN signed for 2020. Return Visit-Office Procedure: Patient instructed to return to the office for a follow up visit 9 weeks  for continued evaluation and treatment.    Gardiner Barefoot DPM

## 2018-10-25 ENCOUNTER — Ambulatory Visit: Payer: Medicare Other | Admitting: Podiatry

## 2018-10-27 ENCOUNTER — Other Ambulatory Visit: Payer: Self-pay | Admitting: Internal Medicine

## 2018-10-27 ENCOUNTER — Other Ambulatory Visit: Payer: Self-pay | Admitting: Nurse Practitioner

## 2018-10-27 DIAGNOSIS — I10 Essential (primary) hypertension: Secondary | ICD-10-CM

## 2018-10-27 DIAGNOSIS — I5032 Chronic diastolic (congestive) heart failure: Secondary | ICD-10-CM

## 2018-11-21 ENCOUNTER — Other Ambulatory Visit: Payer: Self-pay

## 2018-11-21 ENCOUNTER — Ambulatory Visit (INDEPENDENT_AMBULATORY_CARE_PROVIDER_SITE_OTHER): Payer: Medicare Other | Admitting: Nurse Practitioner

## 2018-11-21 ENCOUNTER — Encounter: Payer: Self-pay | Admitting: Nurse Practitioner

## 2018-11-21 VITALS — BP 136/72 | HR 79 | Temp 97.7°F | Ht 64.4 in | Wt 153.2 lb

## 2018-11-21 DIAGNOSIS — E785 Hyperlipidemia, unspecified: Secondary | ICD-10-CM | POA: Diagnosis not present

## 2018-11-21 DIAGNOSIS — Z7189 Other specified counseling: Secondary | ICD-10-CM

## 2018-11-21 DIAGNOSIS — I10 Essential (primary) hypertension: Secondary | ICD-10-CM

## 2018-11-21 DIAGNOSIS — N183 Chronic kidney disease, stage 3 unspecified: Secondary | ICD-10-CM

## 2018-11-21 DIAGNOSIS — I129 Hypertensive chronic kidney disease with stage 1 through stage 4 chronic kidney disease, or unspecified chronic kidney disease: Secondary | ICD-10-CM | POA: Diagnosis not present

## 2018-11-21 DIAGNOSIS — R7303 Prediabetes: Secondary | ICD-10-CM

## 2018-11-21 NOTE — Progress Notes (Signed)
Subjective:     Patient ID: Trevor Murphy , male    DOB: Jun 04, 1935 , 84 y.o.   MRN: [id number]   Chief Complaint  Patient presents with  . Hypertension    HPI  Here today with his sister Particia Nearing - she would like his labs called to her  Hypertension  This is a chronic problem. The current episode started today. The problem is controlled. Pertinent negatives include no anxiety, chest pain, headaches or palpitations. There are no associated agents to hypertension. There are no known risk factors for coronary artery disease. Past treatments include nothing. The current treatment provides no improvement. There are no compliance problems.  There is no history of angina.     Past Medical History:  Diagnosis Date  . Arthritis   . Blindness of left eye   . CAD (coronary artery disease)    a. Cath 08/2009: minimal nonobstructive disease (25% LAD). b. Lexiscan nuc 6/215: EF 54%, no ischemia or infarction.  . Chronic diastolic CHF (congestive heart failure) ([place])    a. Echo 2013: EF 55-60%, mild AI, mild MR. b. Echo 5/15 with EF 60-65%, no WMA, mild LVH, mild AI.  [name] Kitchen CKD (chronic kidney disease) stage 3, GFR 30-59 ml/min (HCC) 07/26/2012   a. Felt likely due to HTN.  . CVA (cerebral infarction) 07/28/2012  . Glaucoma   . Gout   . H/O umbilical hernia repair   . Headache(784.0)    " WHEN MY BLOOD PRESSURE IS UP"  . History of acute renal failure   . History of alcohol abuse   . HTN (hypertension)   . Hypertension    a. Patent renal arteries 2005.  [name] Kitchen Noncompliance   . OA (osteoarthritis)   . Other and unspecified hyperlipidemia   . Poor circulation   . Sinus bradycardia   . SVT (supraventricular tachycardia) ([place])    a. Possible AVNRT.  Got adenosine from EMS in 2015 for SVT.  During 5/15 hospitalization had episode of short R-P tachycardia (possible AVNRT) terminated by adenosine.  b. 04/2016 - recurrent tachycardia (SVT vs. AVNRT), again terminated by Adenosine     Family History   Problem Relation Age of Onset  . CVA Mother   . Hypertension Brother   . Hypertension Sister   . Hypertension Brother   . Stroke Sister   . Heart attack Neg Hx      Current Outpatient Medications:  .  acetaminophen (TYLENOL) 500 MG tablet, Take 1,000 mg by mouth every 6 (six) hours as needed for headache., Disp: , Rfl:  .  allopurinol (ZYLOPRIM) 100 MG tablet, TAKE ONE TABLET BY MOUTH ONCE DAILY TO PREVENT GOUT, Disp: 90 tablet, Rfl: 11 .  aspirin EC 81 MG tablet, Take 1 tablet (81 mg total) by mouth daily., Disp: 90 tablet, Rfl: 3 .  carvedilol (COREG) 6.25 MG tablet, TAKE 1 TABLET (6.25MG ) BY MOUTH TWO TIMES EVERY DAY WITH FOOD, Disp: 180 tablet, Rfl: 11 .  cloNIDine (CATAPRES) 0.2 MG tablet, TAKE ONE TABLET BY MOUTH DAILY, Disp: 90 tablet, Rfl: 11 .  dorzolamide (TRUSOPT) 2 % ophthalmic solution, Place 1 drop into both eyes 3 (three) times daily., Disp: , Rfl:  .  furosemide (LASIX) 20 MG tablet, Take 1 tablet (20 mg total) by mouth every other day. Needs office visit, Disp: 15 tablet, Rfl: 0 .  hydrALAZINE (APRESOLINE) 100 MG tablet, TAKE 1 TABLET (100 MG TOTAL) BY MOUTH THREE TIMES DAILY. **NEEDS TO CALL FOR OCTOBER APPOINTMENT**, Disp: 15 tablet,  Rfl: 0 .  isosorbide mononitrate (IMDUR) 30 MG 24 hr tablet, Take 30 mg by mouth daily., Disp: , Rfl:  .  nitroGLYCERIN (NITROSTAT) 0.4 MG SL tablet, Place 0.4 mg under the tongue every 5 (five) minutes as needed for chest pain. X 3 doses, Disp: , Rfl:  .  potassium chloride (K-DUR,KLOR-CON) 10 MEQ tablet, TAKE ONE TABLET   BY MOUTH   EVERY OTHER DAY, Disp: 45 tablet, Rfl: 5 .  rosuvastatin (CRESTOR) 5 MG tablet, Take 1 tablet (5 mg total) by mouth daily., Disp: 90 tablet, Rfl: 3 .  simvastatin (ZOCOR) 20 MG tablet, TAKE ONE TABLET BY MOUTH AT BEDTIME, Disp: 90 tablet, Rfl: 11 .  Travoprost, BAK Free, (TRAVATAN) 0.004 % SOLN ophthalmic solution, Place 1 drop into both eyes at bedtime., Disp: , Rfl:  .  furosemide (LASIX) 20 MG tablet, Take 1  tablet (20 mg total) by mouth daily. (Patient not taking: Reported on 11/21/2018), Disp: 30 tablet, Rfl: 0 .  potassium chloride (K-DUR,KLOR-CON) 10 MEQ tablet, Take 1 tablet (10 mEq total) by mouth every other day. (Patient not taking: Reported on 11/21/2018), Disp: 15 tablet, Rfl: 0   No Known Allergies   Review of Systems  Constitutional: Negative.  Negative for fatigue.  Respiratory: Negative for cough and wheezing.   Cardiovascular: Negative.  Negative for chest pain, palpitations and leg swelling.  Musculoskeletal: Negative.   Neurological: Negative for dizziness and headaches.     Today's Vitals   11/21/18 1026  BP: 136/72  Pulse: 79  Temp: 97.7 F (36.5 C)  TempSrc: Oral  SpO2: 97%  Weight: 153 lb 3.2 oz (69.5 kg)  Height: 5' 4.4" (1.636 m)   Body mass index is 25.97 kg/m.   Objective:  Physical Exam Vitals signs reviewed.  Constitutional:      Appearance: Normal appearance.     Comments: Pants are big on him today.  HENT:     Head: Normocephalic and atraumatic.     Right Ear: Tympanic membrane and ear canal normal.     Left Ear: Tympanic membrane and ear canal normal.     Nose: No congestion or rhinorrhea.     Mouth/Throat:     Mouth: Mucous membranes are moist.     Pharynx: Oropharynx is clear.  Eyes:     Extraocular Movements: Extraocular movements intact.     Conjunctiva/sclera: Conjunctivae normal.     Pupils: Pupils are equal, round, and reactive to light.  Neck:     Musculoskeletal: Normal range of motion.  Cardiovascular:     Rate and Rhythm: Normal rate and regular rhythm.     Pulses: Normal pulses.     Heart sounds: Normal heart sounds. No murmur.  Pulmonary:     Effort: Pulmonary effort is normal.     Breath sounds: Normal breath sounds.  Abdominal:     General: Abdomen is flat. Bowel sounds are normal.     Palpations: Abdomen is soft.  Skin:    Capillary Refill: Capillary refill takes less than 2 seconds.  Neurological:     General: No  focal deficit present.     Mental Status: He is alert and oriented to person, place, and time.  Psychiatric:        Mood and Affect: Mood normal.        Behavior: Behavior normal.        Thought Content: Thought content normal.        Judgment: Judgment normal.  Assessment And Plan:     1. Essential hypertension . B/P is fairly controlled.  . CMP ordered to check renal function.  . The importance of regular exercise and dietary modification was stressed to the patient.   2. CKD (chronic kidney disease) stage 3, GFR 30-59 ml/min (HCC)  Chronic, stable.  3. Dyslipidemia  Chronic, controlled  Continue with current medications  Will consider decreasing dose pending results  4. Prediabetes  Chronic, stable,   no current medications  Encouraged to limit intake of sugary foods and drinks  Encouraged to increase physical activity throughout the day     [name], [place]   COVID-19 Education: The signs and symptoms of COVID-19 were discussed with the patient and how to seek care for testing (follow up with PCP or arrange E-visit).  The importance of social distancing was discussed today.   Patient Risk:   After full review of this patients clinical status, I feel that they are at least moderate risk at this time.  THE PATIENT IS ENCOURAGED TO PRACTICE SOCIAL DISTANCING DUE TO THE COVID-19 PANDEMIC.

## 2018-11-22 LAB — BMP8+EGFR
BUN/Creatinine Ratio: 12 (ref 10–24)
BUN: 24 mg/dL (ref 8–27)
CO2: 17 mmol/L — ABNORMAL LOW (ref 20–29)
Calcium: 8.8 mg/dL (ref 8.6–10.2)
Chloride: 106 mmol/L (ref 96–106)
Creatinine, Ser: 1.97 mg/dL — ABNORMAL HIGH (ref 0.76–1.27)
GFR calc Af Amer: 36 mL/min/{1.73_m2} — ABNORMAL LOW (ref >59)
GFR calc non Af Amer: 31 mL/min/{1.73_m2} — ABNORMAL LOW (ref >59)
Glucose: 87 mg/dL (ref 65–99)
Potassium: 4.5 mmol/L (ref 3.5–5.2)
Sodium: 140 mmol/L (ref 134–144)

## 2018-11-22 LAB — LIPID PANEL
Chol/HDL Ratio: 3.7 ratio (ref 0.0–5.0)
Cholesterol, Total: 132 mg/dL (ref 100–199)
HDL: 36 mg/dL — ABNORMAL LOW (ref >39)
LDL Calculated: 79 mg/dL (ref 0–99)
Triglycerides: 86 mg/dL (ref 0–149)
VLDL Cholesterol Cal: 17 mg/dL (ref 5–40)

## 2018-11-22 LAB — HEMOGLOBIN A1C
Est. average glucose Bld gHb Est-mCnc: 114 mg/dL
Hgb A1c MFr Bld: 5.6 % (ref 4.8–5.6)

## 2018-11-23 ENCOUNTER — Ambulatory Visit: Payer: Medicare Other | Admitting: Podiatry

## 2018-12-01 ENCOUNTER — Telehealth: Payer: Self-pay

## 2018-12-01 NOTE — Telephone Encounter (Signed)
-----   Message from [name], [place] sent at 11/30/2018  5:44 PM EDT ----- Kidney functions are stable, stay well hydrated. HgbA1c is down to normal at 5.6.  Cholesterol levels are essentially normal.

## 2018-12-01 NOTE — Telephone Encounter (Signed)
1st attempt to give lab results. Left message

## 2018-12-02 ENCOUNTER — Telehealth: Payer: Self-pay

## 2018-12-02 NOTE — Telephone Encounter (Signed)
-----   Message from [name], [place] sent at 11/30/2018  5:44 PM EDT ----- Kidney functions are stable, stay well hydrated. HgbA1c is down to normal at 5.6.  Cholesterol levels are essentially normal.

## 2018-12-02 NOTE — Telephone Encounter (Signed)
Unable to leave message. 2nd attempt to give results

## 2018-12-23 ENCOUNTER — Other Ambulatory Visit: Payer: Self-pay

## 2018-12-23 DIAGNOSIS — I5032 Chronic diastolic (congestive) heart failure: Secondary | ICD-10-CM

## 2018-12-23 DIAGNOSIS — I10 Essential (primary) hypertension: Secondary | ICD-10-CM

## 2018-12-23 MED ORDER — ALLOPURINOL 100 MG PO TABS: ORAL_TABLET | ORAL | 1 refills | Status: DC

## 2018-12-23 MED ORDER — CARVEDILOL 6.25 MG PO TABS: ORAL_TABLET | ORAL | 1 refills | Status: DC

## 2018-12-23 MED ORDER — CLONIDINE HCL 0.2 MG PO TABS: 0.2000 mg | ORAL_TABLET | Freq: Every day | ORAL | 1 refills | Status: DC

## 2018-12-23 MED ORDER — SIMVASTATIN 20 MG PO TABS: 20.0000 mg | ORAL_TABLET | Freq: Every day | ORAL | 1 refills | Status: DC

## 2019-01-16 ENCOUNTER — Other Ambulatory Visit: Payer: Self-pay | Admitting: Internal Medicine

## 2019-01-20 ENCOUNTER — Other Ambulatory Visit: Payer: Self-pay

## 2019-01-20 ENCOUNTER — Ambulatory Visit (INDEPENDENT_AMBULATORY_CARE_PROVIDER_SITE_OTHER): Payer: Medicare Other | Admitting: Podiatry

## 2019-01-20 ENCOUNTER — Encounter: Payer: Self-pay | Admitting: Podiatry

## 2019-01-20 DIAGNOSIS — M79675 Pain in left toe(s): Secondary | ICD-10-CM | POA: Diagnosis not present

## 2019-01-20 DIAGNOSIS — M79674 Pain in right toe(s): Secondary | ICD-10-CM | POA: Diagnosis not present

## 2019-01-20 DIAGNOSIS — B351 Tinea unguium: Secondary | ICD-10-CM | POA: Insufficient documentation

## 2019-01-20 NOTE — Progress Notes (Signed)
Complaint:  Visit Type: Patient returns to my office for continued preventative foot care services. Complaint: Patient states" my nails have grown long and thick and become painful to walk and wear shoes" . The patient presents for preventative foot care services. No changes to ROS.  Patient is accompanied by his assistant.  Podiatric Exam: Vascular: dorsalis pedis and posterior tibial pulses are weakly  palpable bilateral. Capillary return is immediate. Temperature gradient is diminished. Skin turgor WNL  Sensorium: Normal Semmes Weinstein monofilament test. Normal tactile sensation bilaterally. Nail Exam: Pt has thick disfigured discolored nails with subungual debris noted bilateral entire nail hallux through fifth toenails Ulcer Exam: There is no evidence of ulcer or pre-ulcerative changes or infection. Orthopedic Exam: Muscle tone and strength are WNL. No limitations in general ROM. No crepitus or effusions noted. Foot type and digits show no abnormalities. HAV  B/L. Skin: No Porokeratosis. No infection or ulcers  Diagnosis:  Onychomycosis, , Pain in right toe, pain in left toes  Treatment & Plan Procedures and Treatment: Consent by patient was obtained for treatment procedures. The patient understood the discussion of treatment and procedures well. All questions were answered thoroughly reviewed. Debridement of mycotic and hypertrophic toenails, 1 through 5 bilateral and clearing of subungual debris.  Return Visit-Office Procedure: Patient instructed to return to the office for a follow up visit 9 weeks  for continued evaluation and treatment.    Gardiner Barefoot DPM

## 2019-02-07 ENCOUNTER — Other Ambulatory Visit: Payer: Self-pay | Admitting: Internal Medicine

## 2019-03-31 ENCOUNTER — Ambulatory Visit (INDEPENDENT_AMBULATORY_CARE_PROVIDER_SITE_OTHER): Payer: Medicare Other | Admitting: Podiatry

## 2019-03-31 ENCOUNTER — Other Ambulatory Visit: Payer: Self-pay

## 2019-03-31 ENCOUNTER — Encounter: Payer: Self-pay | Admitting: Podiatry

## 2019-03-31 VITALS — Temp 98.3°F

## 2019-03-31 DIAGNOSIS — M79675 Pain in left toe(s): Secondary | ICD-10-CM | POA: Diagnosis not present

## 2019-03-31 DIAGNOSIS — B351 Tinea unguium: Secondary | ICD-10-CM

## 2019-03-31 DIAGNOSIS — M79674 Pain in right toe(s): Secondary | ICD-10-CM | POA: Diagnosis not present

## 2019-03-31 DIAGNOSIS — I739 Peripheral vascular disease, unspecified: Secondary | ICD-10-CM

## 2019-03-31 NOTE — Progress Notes (Signed)
Complaint:  Visit Type: Patient returns to my office for continued preventative foot care services. Complaint: Patient states" my nails have grown long and thick and become painful to walk and wear shoes" . The patient presents for preventative foot care services. No changes to ROS.  Patient is accompanied by his assistant.  Podiatric Exam: Vascular: dorsalis pedis and posterior tibial pulses are weakly  palpable bilateral. Capillary return is immediate. Temperature gradient is diminished. Skin turgor WNL  Sensorium: Normal Semmes Weinstein monofilament test. Normal tactile sensation bilaterally. Nail Exam: Pt has thick disfigured discolored nails with subungual debris noted bilateral entire nail hallux through fifth toenails Ulcer Exam: There is no evidence of ulcer or pre-ulcerative changes or infection. Orthopedic Exam: Muscle tone and strength are WNL. No limitations in general ROM. No crepitus or effusions noted. Foot type and digits show no abnormalities. HAV  B/L. Skin: No Porokeratosis. No infection or ulcers  Diagnosis:  Onychomycosis, , Pain in right toe, pain in left toes  Treatment & Plan Procedures and Treatment: Consent by patient was obtained for treatment procedures. The patient understood the discussion of treatment and procedures well. All questions were answered thoroughly reviewed. Debridement of mycotic and hypertrophic toenails, 1 through 5 bilateral and clearing of subungual debris.  Return Visit-Office Procedure: Patient instructed to return to the office for a follow up visit 9 weeks  for continued evaluation and treatment.    Terea Neubauer DPM 

## 2019-05-20 ENCOUNTER — Encounter (HOSPITAL_COMMUNITY): Payer: Self-pay | Admitting: Internal Medicine

## 2019-05-20 ENCOUNTER — Inpatient Hospital Stay (HOSPITAL_COMMUNITY): Payer: Medicare Other

## 2019-05-20 ENCOUNTER — Other Ambulatory Visit: Payer: Self-pay

## 2019-05-20 ENCOUNTER — Inpatient Hospital Stay (HOSPITAL_COMMUNITY)
Admission: EM | Admit: 2019-05-20 | Discharge: 2019-05-22 | DRG: 065 | Disposition: A | Discharge status: Home Health | Payer: Medicare Other | Attending: Family Medicine | Admitting: Family Medicine

## 2019-05-20 ENCOUNTER — Emergency Department (HOSPITAL_COMMUNITY): Payer: Medicare Other

## 2019-05-20 DIAGNOSIS — Z7982 Long term (current) use of aspirin: Secondary | ICD-10-CM

## 2019-05-20 DIAGNOSIS — Z823 Family history of stroke: Secondary | ICD-10-CM

## 2019-05-20 DIAGNOSIS — M109 Gout, unspecified: Secondary | ICD-10-CM | POA: Diagnosis present

## 2019-05-20 DIAGNOSIS — Z8673 Personal history of transient ischemic attack (TIA), and cerebral infarction without residual deficits: Secondary | ICD-10-CM | POA: Diagnosis not present

## 2019-05-20 DIAGNOSIS — I63233 Cerebral infarction due to unspecified occlusion or stenosis of bilateral carotid arteries: Secondary | ICD-10-CM | POA: Diagnosis not present

## 2019-05-20 DIAGNOSIS — H409 Unspecified glaucoma: Secondary | ICD-10-CM | POA: Diagnosis present

## 2019-05-20 DIAGNOSIS — I130 Hypertensive heart and chronic kidney disease with heart failure and stage 1 through stage 4 chronic kidney disease, or unspecified chronic kidney disease: Secondary | ICD-10-CM | POA: Diagnosis present

## 2019-05-20 DIAGNOSIS — Z87891 Personal history of nicotine dependence: Secondary | ICD-10-CM

## 2019-05-20 DIAGNOSIS — I639 Cerebral infarction, unspecified: Secondary | ICD-10-CM | POA: Diagnosis not present

## 2019-05-20 DIAGNOSIS — F1010 Alcohol abuse, uncomplicated: Secondary | ICD-10-CM | POA: Diagnosis present

## 2019-05-20 DIAGNOSIS — I161 Hypertensive emergency: Secondary | ICD-10-CM | POA: Diagnosis present

## 2019-05-20 DIAGNOSIS — R29818 Other symptoms and signs involving the nervous system: Secondary | ICD-10-CM | POA: Diagnosis not present

## 2019-05-20 DIAGNOSIS — E1122 Type 2 diabetes mellitus with diabetic chronic kidney disease: Secondary | ICD-10-CM | POA: Diagnosis present

## 2019-05-20 DIAGNOSIS — R29810 Facial weakness: Secondary | ICD-10-CM | POA: Diagnosis present

## 2019-05-20 DIAGNOSIS — R29704 NIHSS score 4: Secondary | ICD-10-CM | POA: Diagnosis present

## 2019-05-20 DIAGNOSIS — N1832 Chronic kidney disease, stage 3b: Secondary | ICD-10-CM | POA: Diagnosis present

## 2019-05-20 DIAGNOSIS — R531 Weakness: Secondary | ICD-10-CM | POA: Diagnosis not present

## 2019-05-20 DIAGNOSIS — I340 Nonrheumatic mitral (valve) insufficiency: Secondary | ICD-10-CM

## 2019-05-20 DIAGNOSIS — I10 Essential (primary) hypertension: Secondary | ICD-10-CM | POA: Diagnosis not present

## 2019-05-20 DIAGNOSIS — Z03818 Encounter for observation for suspected exposure to other biological agents ruled out: Secondary | ICD-10-CM | POA: Diagnosis not present

## 2019-05-20 DIAGNOSIS — I351 Nonrheumatic aortic (valve) insufficiency: Secondary | ICD-10-CM | POA: Diagnosis not present

## 2019-05-20 DIAGNOSIS — Z8249 Family history of ischemic heart disease and other diseases of the circulatory system: Secondary | ICD-10-CM

## 2019-05-20 DIAGNOSIS — R471 Dysarthria and anarthria: Secondary | ICD-10-CM | POA: Diagnosis present

## 2019-05-20 DIAGNOSIS — I63511 Cerebral infarction due to unspecified occlusion or stenosis of right middle cerebral artery: Secondary | ICD-10-CM | POA: Diagnosis present

## 2019-05-20 DIAGNOSIS — I5042 Chronic combined systolic (congestive) and diastolic (congestive) heart failure: Secondary | ICD-10-CM | POA: Diagnosis present

## 2019-05-20 DIAGNOSIS — Z79899 Other long term (current) drug therapy: Secondary | ICD-10-CM | POA: Diagnosis not present

## 2019-05-20 DIAGNOSIS — D631 Anemia in chronic kidney disease: Secondary | ICD-10-CM | POA: Diagnosis present

## 2019-05-20 DIAGNOSIS — G8194 Hemiplegia, unspecified affecting left nondominant side: Secondary | ICD-10-CM | POA: Diagnosis present

## 2019-05-20 DIAGNOSIS — I4510 Unspecified right bundle-branch block: Secondary | ICD-10-CM | POA: Diagnosis present

## 2019-05-20 DIAGNOSIS — E785 Hyperlipidemia, unspecified: Secondary | ICD-10-CM | POA: Diagnosis present

## 2019-05-20 DIAGNOSIS — D509 Iron deficiency anemia, unspecified: Secondary | ICD-10-CM | POA: Diagnosis present

## 2019-05-20 DIAGNOSIS — Z20828 Contact with and (suspected) exposure to other viral communicable diseases: Secondary | ICD-10-CM | POA: Diagnosis present

## 2019-05-20 DIAGNOSIS — I2510 Atherosclerotic heart disease of native coronary artery without angina pectoris: Secondary | ICD-10-CM | POA: Diagnosis present

## 2019-05-20 DIAGNOSIS — I5032 Chronic diastolic (congestive) heart failure: Secondary | ICD-10-CM | POA: Diagnosis not present

## 2019-05-20 DIAGNOSIS — Z66 Do not resuscitate: Secondary | ICD-10-CM | POA: Diagnosis present

## 2019-05-20 DIAGNOSIS — N183 Chronic kidney disease, stage 3 unspecified: Secondary | ICD-10-CM | POA: Diagnosis not present

## 2019-05-20 DIAGNOSIS — H5462 Unqualified visual loss, left eye, normal vision right eye: Secondary | ICD-10-CM | POA: Diagnosis present

## 2019-05-20 HISTORY — DX: Unspecified visual loss: H54.7

## 2019-05-20 LAB — DIFFERENTIAL
Abs Immature Granulocytes: 0.01 10*3/uL (ref 0.00–0.07)
Basophils Absolute: 0 10*3/uL (ref 0.0–0.1)
Basophils Relative: 1 %
Eosinophils Absolute: 0.2 10*3/uL (ref 0.0–0.5)
Eosinophils Relative: 4 %
Immature Granulocytes: 0 %
Lymphocytes Relative: 30 %
Lymphs Abs: 1.3 10*3/uL (ref 0.7–4.0)
Monocytes Absolute: 0.4 10*3/uL (ref 0.1–1.0)
Monocytes Relative: 8 %
Neutro Abs: 2.6 10*3/uL (ref 1.7–7.7)
Neutrophils Relative %: 57 %

## 2019-05-20 LAB — URINALYSIS, ROUTINE W REFLEX MICROSCOPIC
Bacteria, UA: NONE SEEN
Bilirubin Urine: NEGATIVE
Glucose, UA: NEGATIVE mg/dL
Ketones, ur: NEGATIVE mg/dL
Leukocytes,Ua: NEGATIVE
Nitrite: NEGATIVE
Protein, ur: NEGATIVE mg/dL
Specific Gravity, Urine: 1.004 — ABNORMAL LOW (ref 1.005–1.030)
pH: 6 (ref 5.0–8.0)

## 2019-05-20 LAB — COMPREHENSIVE METABOLIC PANEL
ALT: 18 U/L (ref 0–44)
AST: 18 U/L (ref 15–41)
Albumin: 3.5 g/dL (ref 3.5–5.0)
Alkaline Phosphatase: 68 U/L (ref 38–126)
Anion gap: 10 (ref 5–15)
BUN: 19 mg/dL (ref 8–23)
CO2: 19 mmol/L — ABNORMAL LOW (ref 22–32)
Calcium: 8.4 mg/dL — ABNORMAL LOW (ref 8.9–10.3)
Chloride: 108 mmol/L (ref 98–111)
Creatinine, Ser: 1.99 mg/dL — ABNORMAL HIGH (ref 0.61–1.24)
GFR calc Af Amer: 35 mL/min — ABNORMAL LOW (ref >60)
GFR calc non Af Amer: 30 mL/min — ABNORMAL LOW (ref >60)
Glucose, Bld: 99 mg/dL (ref 70–99)
Potassium: 5.1 mmol/L (ref 3.5–5.1)
Sodium: 137 mmol/L (ref 135–145)
Total Bilirubin: 0.5 mg/dL (ref 0.3–1.2)
Total Protein: 7.3 g/dL (ref 6.5–8.1)

## 2019-05-20 LAB — CBC
HCT: 36.7 % — ABNORMAL LOW (ref 39.0–52.0)
Hemoglobin: 10.7 g/dL — ABNORMAL LOW (ref 13.0–17.0)
MCH: 21.1 pg — ABNORMAL LOW (ref 26.0–34.0)
MCHC: 29.2 g/dL — ABNORMAL LOW (ref 30.0–36.0)
MCV: 72.2 fL — ABNORMAL LOW (ref 80.0–100.0)
Platelets: 189 10*3/uL (ref 150–400)
RBC: 5.08 MIL/uL (ref 4.22–5.81)
RDW: 21.8 % — ABNORMAL HIGH (ref 11.5–15.5)
WBC: 4.5 10*3/uL (ref 4.0–10.5)
nRBC: 0 % (ref 0.0–0.2)

## 2019-05-20 LAB — PROTIME-INR
INR: 1.1 (ref 0.8–1.2)
Prothrombin Time: 14.2 seconds (ref 11.4–15.2)

## 2019-05-20 LAB — APTT: aPTT: 44 seconds — ABNORMAL HIGH (ref 24–36)

## 2019-05-20 LAB — SARS CORONAVIRUS 2 (TAT 6-24 HRS): SARS Coronavirus 2: NEGATIVE

## 2019-05-20 MED ORDER — LATANOPROST 0.005 % OP SOLN
1.0000 [drp] | Freq: Every day | OPHTHALMIC | Status: DC
  Administered 2019-05-20 – 2019-05-21 (×2): 1 [drp] via OPHTHALMIC
  Filled 2019-05-20: qty 2.5

## 2019-05-20 MED ORDER — ACETAMINOPHEN 650 MG RE SUPP: 650.0000 mg | RECTAL | Status: DC | PRN

## 2019-05-20 MED ORDER — ENOXAPARIN SODIUM 40 MG/0.4ML ~~LOC~~ SOLN
40.0000 mg | SUBCUTANEOUS | Status: DC
  Administered 2019-05-20 – 2019-05-22 (×3): 40 mg via SUBCUTANEOUS
  Filled 2019-05-20 (×3): qty 0.4

## 2019-05-20 MED ORDER — DORZOLAMIDE HCL 2 % OP SOLN
1.0000 [drp] | Freq: Three times a day (TID) | OPHTHALMIC | Status: DC
  Administered 2019-05-20 – 2019-05-22 (×6): 1 [drp] via OPHTHALMIC
  Filled 2019-05-20: qty 10

## 2019-05-20 MED ORDER — SODIUM CHLORIDE 0.9 % IV SOLN
INTRAVENOUS | Status: DC
  Administered 2019-05-20: 16:00:00 via INTRAVENOUS

## 2019-05-20 MED ORDER — ACETAMINOPHEN 160 MG/5ML PO SOLN: 650.0000 mg | ORAL | Status: DC | PRN

## 2019-05-20 MED ORDER — ASPIRIN 81 MG PO CHEW
324.0000 mg | CHEWABLE_TABLET | Freq: Once | ORAL | Status: AC
  Administered 2019-05-20: 324 mg via ORAL
  Filled 2019-05-20: qty 4

## 2019-05-20 MED ORDER — ALLOPURINOL 100 MG PO TABS
100.0000 mg | ORAL_TABLET | Freq: Every day | ORAL | Status: DC
  Administered 2019-05-20 – 2019-05-22 (×3): 100 mg via ORAL
  Filled 2019-05-20 (×4): qty 1

## 2019-05-20 MED ORDER — IOHEXOL 350 MG/ML SOLN
50.0000 mL | Freq: Once | INTRAVENOUS | Status: AC | PRN
  Administered 2019-05-20: 50 mL via INTRAVENOUS

## 2019-05-20 MED ORDER — ACETAMINOPHEN 325 MG PO TABS
650.0000 mg | ORAL_TABLET | ORAL | Status: DC | PRN
  Administered 2019-05-20 – 2019-05-21 (×3): 650 mg via ORAL
  Filled 2019-05-20 (×3): qty 2

## 2019-05-20 MED ORDER — STROKE: EARLY STAGES OF RECOVERY BOOK
Freq: Once | Status: DC
  Filled 2019-05-20: qty 1

## 2019-05-20 MED ORDER — LORAZEPAM 2 MG/ML IJ SOLN
0.5000 mg | Freq: Once | INTRAMUSCULAR | Status: AC
  Administered 2019-05-20: 0.5 mg via INTRAVENOUS
  Filled 2019-05-20: qty 1

## 2019-05-20 MED ORDER — ATORVASTATIN CALCIUM 40 MG PO TABS
40.0000 mg | ORAL_TABLET | Freq: Every day | ORAL | Status: DC
  Administered 2019-05-20: 40 mg via ORAL
  Filled 2019-05-20: qty 1

## 2019-05-20 MED ORDER — ASPIRIN EC 81 MG PO TBEC
81.0000 mg | DELAYED_RELEASE_TABLET | Freq: Every day | ORAL | Status: DC
  Administered 2019-05-20: 81 mg via ORAL
  Filled 2019-05-20: qty 1

## 2019-05-20 MED ORDER — SENNOSIDES-DOCUSATE SODIUM 8.6-50 MG PO TABS: 1.0000 | ORAL_TABLET | Freq: Every evening | ORAL | Status: DC | PRN

## 2019-05-20 NOTE — ED Notes (Signed)
Heart healthy lunch tray ordered 

## 2019-05-20 NOTE — Progress Notes (Signed)
*  PRELIMINARY RESULTS* Echocardiogram 2D Echocardiogram has been performed.  Leavy Cella 05/20/2019, 5:38 PM

## 2019-05-20 NOTE — ED Triage Notes (Signed)
Pt states that he has not been able to move his left arm x2 days.

## 2019-05-20 NOTE — ED Provider Notes (Signed)
[facility] EMERGENCY DEPARTMENT Provider Note   CSN: [id number] Arrival date & time: 05/20/19  7989     History   Chief Complaint Chief Complaint  Patient presents with  . Weakness    Left side    HPI Trevor Murphy is a 84 y.o. male.     HPI   Patient is an 84 year old male with a history of chronic left eye blindness, CAD, CHF, CKD, CVA, hypertension, osteoarthritis, who presents to the emergency department today for evaluation of left arm weakness.  States he has not been able to move his left arm for the last 2 days.  He also has some numbness to the left hand.  He states his left leg is also weak.  His sister is at bedside and states that his speech is slurred and also the left side of his face appears to be drooping.  Patient denies other associated symptoms including no fevers, chest pain, shortness of breath or other symptoms.  Past Medical History:  Diagnosis Date  . Arthritis   . Blindness of left eye   . CAD (coronary artery disease)    a. Cath 08/2009: minimal nonobstructive disease (25% LAD). b. Lexiscan nuc 6/215: EF 54%, no ischemia or infarction.  . Chronic diastolic CHF (congestive heart failure) ([place])    a. Echo 2013: EF 55-60%, mild AI, mild MR. b. Echo 5/15 with EF 60-65%, no WMA, mild LVH, mild AI.  [name] Kitchen CKD (chronic kidney disease) stage 3, GFR 30-59 ml/min (HCC) 07/26/2012   a. Felt likely due to HTN.  . CVA (cerebral infarction) 07/28/2012  . Glaucoma   . Gout   . H/O umbilical hernia repair   . Headache(784.0)    " WHEN MY BLOOD PRESSURE IS UP"  . History of acute renal failure   . History of alcohol abuse   . HTN (hypertension)   . Hypertension    a. Patent renal arteries 2005.  [name] Kitchen Noncompliance   . OA (osteoarthritis)   . Other and unspecified hyperlipidemia   . Poor circulation   . Sinus bradycardia   . SVT (supraventricular tachycardia) ([place])    a. Possible AVNRT.  Got adenosine from EMS in 2015 for SVT.  During 5/15  hospitalization had episode of short R-P tachycardia (possible AVNRT) terminated by adenosine.  b. 04/2016 - recurrent tachycardia (SVT vs. AVNRT), again terminated by Adenosine    Patient Active Problem List   Diagnosis Date Noted  . Pain due to onychomycosis of toenails of both feet 01/20/2019  . Prediabetes 07/22/2018  . Nail abnormality 07/22/2018  . Sinus bradycardia 05/02/2016  . Hypotension due to drugs 05/02/2016  . History of tachycardia 05/02/2016  . GERD (gastroesophageal reflux disease) 02/05/2015  . Mild coronary artery disease 04/20/2014  . Atypical chest pain 04/12/2014  . Hypertensive urgency 03/11/2014  . Chronic diastolic CHF (congestive heart failure) ([place]) 01/30/2014  . Alcohol abuse 01/19/2014  . SVT (supraventricular tachycardia) ([place]) 12/19/2013  . Elevated troponin 12/19/2013  . Gout 12/19/2013  . Dyslipidemia 12/16/2013  . Hypertensive crisis 12/16/2013  . CVA (cerebral infarction) 07/28/2012  . Acute on chronic renal failure ([place]) 07/27/2012  . Essential hypertension 07/26/2012  . CKD (chronic kidney disease) stage 3, GFR 30-59 ml/min 07/26/2012  . Poor circulation   . Recurrent ventral hernia 02/16/2012  . Hernia (acquired) (recurrent) 02/10/2011    Past Surgical History:  Procedure Laterality Date  . APPENDECTOMY    . CARDIAC CATHETERIZATION  11/2003   EF  50-55%  . CARDIAC CATHETERIZATION  08/2009  . CATARACT EXTRACTION EXTRACAPSULAR Right 05/17/2013   Procedure: CATARACT EXTRACTION EXTRACAPSULAR WITH INTRAOCULAR LENS PLACEMENT (IOC) RIGHT EYE;  Surgeon: Marylynn Pearson, MD;  Location: [facility];  Service: Ophthalmology;  Laterality: Right;  . COLONOSCOPY    . HERNIA REPAIR    . VENTRAL HERNIA REPAIR  03/07/2012   Procedure: LAPAROSCOPIC VENTRAL HERNIA;  Surgeon: Gwenyth Ober, MD;  Location: [facility];  Service: General;  Laterality: N/A;        Home Medications    Prior to Admission medications   Medication Sig Start Date End Date Taking? Authorizing  Provider  acetaminophen (TYLENOL) 500 MG tablet Take 1,000 mg by mouth every 6 (six) hours as needed for headache.    [provider]  allopurinol (ZYLOPRIM) 100 MG tablet TAKE ONE TABLET BY MOUTH ONCE DAILY TO PREVENT GOUT 12/23/18   [name], FNP  aspirin EC 81 MG tablet Take 1 tablet (81 mg total) by mouth daily. 03/07/15   Larey Dresser, MD  carvedilol (COREG) 6.25 MG tablet TAKE 1 TABLET (6.25MG ) BY MOUTH TWO TIMES EVERY DAY WITH FOOD 12/23/18   [name], FNP  cloNIDine (CATAPRES) 0.2 MG tablet Take 1 tablet (0.2 mg total) by mouth daily. 12/23/18   [name], FNP  dorzolamide (TRUSOPT) 2 % ophthalmic solution Place 1 drop into both eyes 3 (three) times daily.    Marylynn Pearson, MD  hydrALAZINE (APRESOLINE) 100 MG tablet TAKE 1 TABLET (100 MG TOTAL) BY MOUTH THREE TIMES DAILY. **NEEDS TO CALL FOR OCTOBER APPOINTMENT** 06/11/17   Thompson Grayer, MD  isosorbide mononitrate (IMDUR) 30 MG 24 hr tablet Take 30 mg by mouth daily.    [provider]  LUMIGAN 0.01 % SOLN  02/07/19   [provider]  nitroGLYCERIN (NITROSTAT) 0.4 MG SL tablet Place 0.4 mg under the tongue every 5 (five) minutes as needed for chest pain. X 3 doses    [provider]  potassium chloride (K-DUR) 10 MEQ tablet  03/27/19   [provider]  simvastatin (ZOCOR) 20 MG tablet Take 1 tablet (20 mg total) by mouth at bedtime. 12/23/18   [name], FNP  Travoprost, BAK Free, (TRAVATAN) 0.004 % SOLN ophthalmic solution Place 1 drop into both eyes at bedtime.    Marylynn Pearson, MD    Family History Family History  Problem Relation Age of Onset  . CVA Mother   . Hypertension Brother   . Hypertension Sister   . Hypertension Brother   . Stroke Sister   . Heart attack Neg Hx     Social History Social History   Tobacco Use  . Smoking status: Never Smoker  . Smokeless tobacco: Never Used  Substance Use Topics  . Alcohol use: No    Alcohol/week: 1.0 standard drinks     Types: 1 Cans of beer per week  . Drug use: No     Allergies   Patient has no known allergies.   Review of Systems Review of Systems  Constitutional: Negative for fever.  HENT: Negative for ear pain and sore throat.   Eyes: Negative for visual disturbance.  Respiratory: Negative for cough and shortness of breath.   Cardiovascular: Negative for chest pain.  Gastrointestinal: Negative for abdominal pain, constipation, diarrhea, nausea and vomiting.  Genitourinary: Negative for dysuria.  Musculoskeletal: Negative for back pain.  Skin: Negative for rash.  Neurological: Positive for speech difficulty, weakness and numbness. Negative for headaches.  All other systems  reviewed and are negative.    Physical Exam Updated Vital Signs BP (!) 149/115 (BP Location: Right Arm)   Pulse 67   Temp 98.6 F (37 C) (Oral)   Resp 17   Ht 5\' 7"  (1.702 m)   Wt 74.8 kg   SpO2 100%   BMI 25.84 kg/m   Physical Exam Vitals signs and nursing note reviewed.  Constitutional:      Appearance: He is well-developed.  HENT:     Head: Normocephalic and atraumatic.  Eyes:     Conjunctiva/sclera: Conjunctivae normal.  Neck:     Musculoskeletal: Neck supple.  Cardiovascular:     Rate and Rhythm: Normal rate and regular rhythm.     Heart sounds: Murmur present.  Pulmonary:     Effort: Pulmonary effort is normal. No respiratory distress.     Breath sounds: Normal breath sounds.  Abdominal:     Palpations: Abdomen is soft.     Tenderness: There is no abdominal tenderness.  Skin:    General: Skin is warm and dry.  Neurological:     Mental Status: He is alert.     Comments: Mental Status:  Alert, thought content appropriate, able to give a coherent history. Speech fluent without evidence of aphasia. Able to follow 2 step commands without difficulty.  Cranial Nerves:  II: pupils equal, round, reactive to light III,IV, VI: ptosis not present, extra-ocular motions intact bilaterally  V,VII: left  facial droop, facial light touch sensation equal VIII: hearing grossly normal to voice  X: uvula elevates symmetrically  XI: bilateral shoulder shrug symmetric and strong XII: midline tongue extension without fassiculations Motor:  Normal tone. 1/5 strength to LUE, 2/5 strength to LLE, 5/5 strength RUE/RLE.  Sensory: decreased sensation to the left hand, otherwise normal sensation Gait: not assessed CV: 2+ radial and DP pulses + left pronator drift      ED Treatments / Results  Labs (all labs ordered are listed, but only abnormal results are displayed) Labs Reviewed  CBC - Abnormal; Notable for the following components:      Result Value   Hemoglobin 10.7 (*)    HCT 36.7 (*)    MCV 72.2 (*)    MCH 21.1 (*)    MCHC 29.2 (*)    RDW 21.8 (*)    All other components within normal limits  COMPREHENSIVE METABOLIC PANEL - Abnormal; Notable for the following components:   CO2 19 (*)    Creatinine, Ser 1.99 (*)    Calcium 8.4 (*)    GFR calc non Af Amer 30 (*)    GFR calc Af Amer 35 (*)    All other components within normal limits  APTT - Abnormal; Notable for the following components:   aPTT 44 (*)    All other components within normal limits  SARS CORONAVIRUS 2 (TAT 6-24 HRS)  DIFFERENTIAL  PROTIME-INR  URINALYSIS, ROUTINE W REFLEX MICROSCOPIC    EKG EKG Interpretation  Date/Time:  Saturday May 20 2019 06:32:08 EDT Ventricular Rate:  57 PR Interval:    QRS Duration: 142 QT Interval:  468 QTC Calculation: 456 R Axis:   25 Text Interpretation:  Sinus rhythm Prolonged PR interval Right bundle branch block Confirmed by Addison Lank [phone]) on 05/20/2019 6:53:39 AM   Radiology Ct Head Wo Contrast  Result Date: 05/20/2019 CLINICAL DATA:  Per patient "he has had left arm weakness for 2 days"Focal neuro deficit, > 6 hrs, stroke suspected Possible stroke EXAM: CT HEAD WITHOUT CONTRAST TECHNIQUE:  Contiguous axial images were obtained from the base of the skull  through the vertex without intravenous contrast. COMPARISON:  None. FINDINGS: Brain: No acute intracranial hemorrhage. No focal mass lesion. No CT evidence of acute infarction. No midline shift or mass effect. No hydrocephalus. Basilar cisterns are patent. There are periventricular and subcortical white matter hypodensities. Generalized cortical atrophy. Vascular: No hyperdense vessel or unexpected calcification. Skull: Normal. Negative for fracture or focal lesion. Sinuses/Orbits: Paranasal sinuses and mastoid air cells are clear. Orbits are clear. Other: None. IMPRESSION: 1. No acute intracranial findings. 2. Atrophy and white matter microvascular disease. Electronically Signed   By: Suzy Bouchard M.D.   On: 05/20/2019 07:46   Mr Brain Wo Contrast  Result Date: 05/20/2019 CLINICAL DATA:  Left arm weakness. EXAM: MRI HEAD WITHOUT CONTRAST TECHNIQUE: Multiplanar, multiecho pulse sequences of the brain and surrounding structures were obtained without intravenous contrast. COMPARISON:  Head CT 05/20/2019 and MRI 02/23/2014 FINDINGS: The patient was unable to tolerate the examination which was terminated prematurely. Axial and coronal diffusion sequences and an axial FLAIR sequence were obtained and are severely motion degraded. There is a small region of diffusion signal abnormality involving cortex and white matter in the posterior right frontal lobe consistent with an acute infarct. No gross intracranial hemorrhage, intracranial mass effect, or extra-axial fluid collection is identified. Background moderate chronic small vessel ischemic changes are not well evaluated. There is mild-to-moderate cerebral atrophy. IMPRESSION: 1. Severely motion degraded, incomplete examination. 2. Small acute posterior right frontal lobe infarct. Electronically Signed   By: Logan Bores M.D.   On: 05/20/2019 10:11    Procedures Procedures (including critical care time) CRITICAL CARE Performed by: Rodney Booze    Total critical care time: 32 minutes  Critical care time was exclusive of separately billable procedures and treating other patients.  Critical care was necessary to treat or prevent imminent or life-threatening deterioration.  Critical care was time spent personally by me on the following activities: development of treatment plan with patient and/or surrogate as well as nursing, discussions with consultants, evaluation of patient's response to treatment, examination of patient, obtaining history from patient or surrogate, ordering and performing treatments and interventions, ordering and review of laboratory studies, ordering and review of radiographic studies, pulse oximetry and re-evaluation of patient's condition.   Medications Ordered in ED Medications  aspirin chewable tablet 324 mg (324 mg Oral Given 05/20/19 0735)  LORazepam (ATIVAN) injection 0.5 mg (0.5 mg Intravenous Given 05/20/19 0854)     Initial Impression / Assessment and Plan / ED Course  I have reviewed the triage vital signs and the nursing notes.  Pertinent labs & imaging results that were available during my care of the patient were reviewed by me and considered in my medical decision making (see chart for details).     Final Clinical Impressions(s) / ED Diagnoses   Final diagnoses:  Cerebrovascular accident (CVA), unspecified mechanism ([place])   84 year old male presenting with left arm and leg weakness for the last 2 days.  Also some sensory changes to the left hand.  Per family, speech is somewhat slurred and has left facial droop.  On exam does have LUE/LLE weakness with +left pronator drift and LUE sensory changes. Also with left facial droop. Out of the window for stroke code  CBC with anemia, slightly worse from prior CMP at baseline Coags grossly wnl UA pending at time of admission  EKG with NSR, Prolonged PR interval, RBBB   CT head with no acute  intracranial findings. Atrophy and white matter  microvascular disease.  10:01 AM CONSULT with Dr. Rory Percy who reviewed the MRI and states that it appears pt has had a right sided CVA, likely frontal.   10:30 AM AM CONSULT with Dr. Lorin Mercy with hospitalist service who accepts pt for admission   ED Discharge Orders    None       Bishop Dublin 05/20/19 1032    Fatima Blank, MD 05/24/19 2132

## 2019-05-20 NOTE — Consult Note (Signed)
Neurology Consultation  Reason for Consult: stroke Referring Physician: Dr. Tyrone Nine  CC: LUE weakness x 2 days  History is obtained from: patient, sister  HPI: Trevor Murphy is a 84 y.o. 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  Patient lives on his own, although family visits often and he has a caregiver, noted to be much weaker on his L side today and wasn't able to complete his routine ADLs. On eval today, patient reports L hand numbness, weakness of L leg, and sister at bedside also reports slurred speech, and L facial droop in addition to his above sxs.  Of note, patient has previous hx of L frontal lobe infarct in 2013 in addition to remote infarcts of bilateral cerebellum, R paracentral pontine region, L basal ganglia, L thalamus, and bilateral central semiovale found at that time.   Labs today show elevated sCr 1.99 (stable from Cr 6 months ago), otherwise grossly unremarkable.  Work up that has been done: MRI brain- small acute posterior R frontal lobe infarct, exam limited d/t movement artifacts  Stroke risk factors: Essential (primary) hypertension, Hypertension Emergency (SBP > 180 or DBP > 120 & end organ damage), Chronic systolic (congestive) heart failure and CKD Stage 3 (GFR 30-59)   LKW: 2 days ago tpa given?: no, outside window  ROS: ROS was performed and is negative except as noted in the HPI.   Past Medical History:  Diagnosis Date  . Arthritis   . Blindness of left eye   . CAD (coronary artery disease)    a. Cath 08/2009: minimal nonobstructive disease (25% LAD). b. Lexiscan nuc 6/215: EF 54%, no ischemia or infarction.  . Chronic diastolic CHF (congestive heart failure) ([place])    a. Echo 2013: EF 55-60%, mild AI, mild MR. b. Echo 5/15 with EF 60-65%, no WMA, mild LVH, mild AI.  [name] Kitchen CKD (chronic kidney disease) stage 3, GFR 30-59  ml/min (HCC) 07/26/2012   a. Felt likely due to HTN.  . CVA (cerebral infarction) 07/28/2012  . Glaucoma   . Gout   . H/O umbilical hernia repair   . Headache(784.0)    " WHEN MY BLOOD PRESSURE IS UP"  . History of acute renal failure   . History of alcohol abuse   . HTN (hypertension)   . Hypertension    a. Patent renal arteries 2005.  [name] Kitchen Noncompliance   . OA (osteoarthritis)   . Other and unspecified hyperlipidemia   . Poor circulation   . Sinus bradycardia   . SVT (supraventricular tachycardia) ([place])    a. Possible AVNRT.  Got adenosine from EMS in 2015 for SVT.  During 5/15 hospitalization had episode of short R-P tachycardia (possible AVNRT) terminated by adenosine.  b. 04/2016 - recurrent tachycardia (SVT vs. AVNRT), again terminated by Adenosine   Family History  Problem Relation Age of Onset  . CVA Mother   . Hypertension Brother   . Hypertension Sister   . Hypertension Brother   . Stroke Sister   . Heart attack Neg Hx    Social History:   reports that he has never smoked. He has never used smokeless tobacco. He reports that he does not drink alcohol or use drugs.  Medications No current facility-administered medications for this encounter.   Current Outpatient Medications:  .  acetaminophen (TYLENOL) 500  MG tablet, Take 1,000 mg by mouth every 6 (six) hours as needed for headache., Disp: , Rfl:  .  allopurinol (ZYLOPRIM) 100 MG tablet, TAKE ONE TABLET BY MOUTH ONCE DAILY TO PREVENT GOUT, Disp: 90 tablet, Rfl: 1 .  aspirin EC 81 MG tablet, Take 1 tablet (81 mg total) by mouth daily., Disp: 90 tablet, Rfl: 3 .  carvedilol (COREG) 6.25 MG tablet, TAKE 1 TABLET (6.25MG ) BY MOUTH TWO TIMES EVERY DAY WITH FOOD, Disp: 180 tablet, Rfl: 1 .  cloNIDine (CATAPRES) 0.2 MG tablet, Take 1 tablet (0.2 mg total) by mouth daily., Disp: 90 tablet, Rfl: 1 .  dorzolamide (TRUSOPT) 2 % ophthalmic solution, Place 1 drop into both eyes 3 (three) times daily., Disp: , Rfl:  .  hydrALAZINE  (APRESOLINE) 100 MG tablet, TAKE 1 TABLET (100 MG TOTAL) BY MOUTH THREE TIMES DAILY. **NEEDS TO CALL FOR OCTOBER APPOINTMENT**, Disp: 15 tablet, Rfl: 0 .  isosorbide mononitrate (IMDUR) 30 MG 24 hr tablet, Take 30 mg by mouth daily., Disp: , Rfl:  .  LUMIGAN 0.01 % SOLN, , Disp: , Rfl:  .  nitroGLYCERIN (NITROSTAT) 0.4 MG SL tablet, Place 0.4 mg under the tongue every 5 (five) minutes as needed for chest pain. X 3 doses, Disp: , Rfl:  .  potassium chloride (K-DUR) 10 MEQ tablet, , Disp: , Rfl:  .  simvastatin (ZOCOR) 20 MG tablet, Take 1 tablet (20 mg total) by mouth at bedtime., Disp: 90 tablet, Rfl: 1 .  Travoprost, BAK Free, (TRAVATAN) 0.004 % SOLN ophthalmic solution, Place 1 drop into both eyes at bedtime., Disp: , Rfl:   Exam: Current vital signs: BP (!) 149/115 (BP Location: Right Arm)   Pulse 67   Temp 98.6 F (37 C) (Oral)   Resp 17   Ht 5\' 7"  (1.702 m)   Wt 74.8 kg   SpO2 100%   BMI 25.84 kg/m  Vital signs in last 24 hours: Temp:  [98.6 F (37 C)] 98.6 F (37 C) (10/10 0626) Pulse Rate:  [67] 67 (10/10 0626) Resp:  [17] 17 (10/10 0626) BP: (149)/(115) 149/115 (10/10 0626) SpO2:  [100 %] 100 % (10/10 0626) Weight:  [74.8 kg] 74.8 kg (10/10 0630)  Physical Exam  Constitutional: Appears well-developed and well-nourished.  Psych: Affect appropriate to situation Eyes: cloudy iris of L eye HENT: AT/[place] Cardiovascular: Normal rate and regular rhythm. (+) murmur Respiratory: Effort normal, non-labored breathing GI: Soft.  No distension. There is no tenderness.  Skin: WDI  Neuro: MS: Patient is awake, alert, oriented to person, place, month, year, and situation. Speech/language: Speech dysarthric, no aphasia noted CN: EOMI, perrla, tongue midline, Mild L nasolabial flattening Motor: RHB 5/5, left shoulder 4-/5, L biceps 4+/5, L grip 4-/5, L wrist drop present 3/5, LLE 4/5.  There is mild twitching of his right upper and lower extremity which she says has been there for a  while.  He has asterixis on outstretched arms bilaterally.LUE + LLE drift present Sensory: no deficits noted FNF Normal Heel to shin normal NIHSS 1a Level of Conscious.: 0 1b LOC Questions: 0 1c LOC Commands: 0 2 Best Gaze: 0 3 Visual: 0 4 Facial Palsy: 1 5a Motor Arm - left: 1 5b Motor Arm - Right: 0 6a Motor Leg - Left: 1 6b Motor Leg - Right: 0 7 Limb Ataxia: 0 8 Sensory: 0 9 Best Language: 0 10 Dysarthria: 1 11 Extinct. and Inatten.: 0 NIHSS TOTAL: 4   Labs I have reviewed labs in  epic and the results pertinent to this consultation are:  CBC    Component Value Date/Time   WBC 4.5 05/20/2019 0630   RBC 5.08 05/20/2019 0630   HGB 10.7 (L) 05/20/2019 0630   HGB 11.7 (L) 07/22/2018 1136   HCT 36.7 (L) 05/20/2019 0630   HCT 38.4 07/22/2018 1136   PLT 189 05/20/2019 0630   PLT 207 07/22/2018 1136   MCV 72.2 (L) 05/20/2019 0630   MCV 78 (L) 07/22/2018 1136   MCH 21.1 (L) 05/20/2019 0630   MCHC 29.2 (L) 05/20/2019 0630   RDW 21.8 (H) 05/20/2019 0630   RDW 16.3 (H) 07/22/2018 1136   LYMPHSABS 1.3 05/20/2019 0630   MONOABS 0.4 05/20/2019 0630   EOSABS 0.2 05/20/2019 0630   BASOSABS 0.0 05/20/2019 0630   CMP     Component Value Date/Time   NA 137 05/20/2019 0630   NA 140 11/21/2018 1103   K 5.1 05/20/2019 0630   CL 108 05/20/2019 0630   CO2 19 (L) 05/20/2019 0630   GLUCOSE 99 05/20/2019 0630   BUN 19 05/20/2019 0630   BUN 24 11/21/2018 1103   CREATININE 1.99 (H) 05/20/2019 0630   CALCIUM 8.4 (L) 05/20/2019 0630   PROT 7.3 05/20/2019 0630   PROT 6.7 07/22/2018 1136   ALBUMIN 3.5 05/20/2019 0630   ALBUMIN 3.6 07/22/2018 1136   AST 18 05/20/2019 0630   ALT 18 05/20/2019 0630   ALKPHOS 68 05/20/2019 0630   BILITOT 0.5 05/20/2019 0630   BILITOT 0.2 07/22/2018 1136   GFRNONAA 30 (L) 05/20/2019 0630   GFRAA 35 (L) 05/20/2019 0630   Lipid Panel     Component Value Date/Time   CHOL 132 11/21/2018 1103   TRIG 86 11/21/2018 1103   HDL 36 (L) 11/21/2018 1103    CHOLHDL 3.7 11/21/2018 1103   CHOLHDL 4.2 04/11/2014 2150   VLDL 38 04/11/2014 2150   LDLCALC 79 11/21/2018 1103   Imaging I have reviewed the images obtained:  CT-scan of the brain 10/10 1. No acute intracranial findings. 2. Atrophy and white matter microvascular disease.  MRI examination of the brain 10/10 1. Severely motion degraded, incomplete examination. 2. Small acute posterior right frontal lobe infarct.  Posey Pronto PA-C [facility] [phone]  Assessment:  Subacute R posterior frontal lobe infarct Etiology under investigation embolic versus small vessel Not a candidate for TPA due to being outside the window.  No evidence of cortical signs on exam hence not a candidate for EVT.  Recommendations: -Admit to hospitalist -Telemetry monitoring -Allow for permissive hypertension for the first 24-48h - only treat PRN if SBP >220 mmHg. Blood pressures can be gradually normalized to SBP<140 upon discharge. -MRI brain without contrast -Carotid dopplers -Echocardiogram -HgbA1c, fasting lipid panel -Frequent neuro checks -Prophylactic therapy-Antiplatelet med: Aspirin - dose 325mg  PO or 300mg  PR -Atorvastatin 80 mg PO daily -Risk factor modification -PT consult, OT consult, Speech consult -If Afib found on telemetry, will need anticoagulation. Decision pending imaging and stroke team rounding.  Please page stroke NP/PA/MD (listed on AMION)  from 8am-4 pm as this patient will be followed by the stroke team at this point.    Attending Neurohospitalist Addendum Patient seen and examined with APP/Resident. Agree with the history and physical as documented above. Agree with the plan as documented, which I helped formulate. I have independently reviewed the chart, obtained history, review of systems and examined the patient.I have personally reviewed pertinent head/neck/spine imaging (CT/MRI). Please feel free to call with any questions. ---  Amie Portland, MD [facility] Pager: [phone]  If 7pm to 7am, please call on call as listed on AMION.

## 2019-05-20 NOTE — H&P (Signed)
History and Physical    Trevor Murphy LKG:[id number] DOB: 06-Apr-1935 DOA: 05/20/2019  PCP: Glendale Chard, MD Consultants:  Prudence Davidson - podiatry; Allred - cardiology Patient coming from:  Home - lives alone; NOK: Sister or son, [phone]  Chief Complaint: left-sided weakness  HPI: Trevor Murphy is a 84 y.o. male with medical history significant of SVT; HLD; HTN; ETOH abuse; CVA (2013); stage 3 CKD; CAD; and chronic diastolic CHF presenting with left-sided weakness.  He reports that he was having difficulty using his left hand, "it just went dead on me."  This happened 2 days ago.  He thought it would go away and it didn't.  He did notice any problems with the leg at first, but his caregiver called his sister this AM to report that he was dragging the leg today.  No dysphagia or dysarthria.  No h/o dementia.He complained of headache to his caregiver but he reports not having a headache.  He has severe visual disturbance and so because of this he has a caregiver.   ED Course:  Patient with CVA 2 days ago.  Neuro recommends admission for further evaluation.  Review of Systems: As per HPI; otherwise review of systems reviewed and negative.   Ambulatory Status:  Ambulates with a rollator, occasionally uses a wheelchair  Past Medical History:  Diagnosis Date  . Arthritis   . CAD (coronary artery disease)    a. Cath 08/2009: minimal nonobstructive disease (25% LAD). b. Lexiscan nuc 6/215: EF 54%, no ischemia or infarction.  . Chronic diastolic CHF (congestive heart failure) ([place])    a. Echo 2013: EF 55-60%, mild AI, mild MR. b. Echo 5/15 with EF 60-65%, no WMA, mild LVH, mild AI.  [name] Kitchen CKD (chronic kidney disease) stage 3, GFR 30-59 ml/min 07/26/2012   a. Felt likely due to HTN.  . CVA (cerebral infarction) 07/28/2012  . Glaucoma   . Gout   . H/O umbilical hernia repair   . Headache(784.0)    " WHEN MY BLOOD PRESSURE IS UP"  . History of acute renal failure   . History of alcohol abuse   . HTN  (hypertension)   . Hypertension    a. Patent renal arteries 2005.  [name] Kitchen Noncompliance   . OA (osteoarthritis)   . Other and unspecified hyperlipidemia   . Poor circulation   . Sinus bradycardia   . SVT (supraventricular tachycardia) ([place])    a. Possible AVNRT.  Got adenosine from EMS in 2015 for SVT.  During 5/15 hospitalization had episode of short R-P tachycardia (possible AVNRT) terminated by adenosine.  b. 04/2016 - recurrent tachycardia (SVT vs. AVNRT), again terminated by Adenosine  . Visual impairment     Past Surgical History:  Procedure Laterality Date  . APPENDECTOMY    . CARDIAC CATHETERIZATION  11/2003   EF 50-55%  . CARDIAC CATHETERIZATION  08/2009  . CATARACT EXTRACTION EXTRACAPSULAR Right 05/17/2013   Procedure: CATARACT EXTRACTION EXTRACAPSULAR WITH INTRAOCULAR LENS PLACEMENT (IOC) RIGHT EYE;  Surgeon: Marylynn Pearson, MD;  Location: [place];  Service: Ophthalmology;  Laterality: Right;  . COLONOSCOPY    . HERNIA REPAIR    . VENTRAL HERNIA REPAIR  03/07/2012   Procedure: LAPAROSCOPIC VENTRAL HERNIA;  Surgeon: Gwenyth Ober, MD;  Location: [place];  Service: General;  Laterality: N/A;    Social History   Socioeconomic History  . Marital status: Married    Spouse name: Not on file  . Number of children: Not on file  . Years of  education: Not on file  . Highest education level: Not on file  Occupational History  . Occupation: retired  Scientific laboratory technician  . Financial resource strain: Not hard at all  . Food insecurity    Worry: Never true    Inability: Never true  . Transportation needs    Medical: No    Non-medical: No  Tobacco Use  . Smoking status: Never Smoker  . Smokeless tobacco: Never Used  Substance and Sexual Activity  . Alcohol use: No    Alcohol/week: 1.0 standard drinks    Types: 1 Cans of beer per week    Comment: h/o heavy use, quit in 2017  . Drug use: No  . Sexual activity: Not Currently  Lifestyle  . Physical activity    Days per week: 0 days     Minutes per session: 0 min  . Stress: Not at all  Relationships  . Social Herbalist on phone: Not on file    Gets together: Not on file    Attends religious service: Not on file    Active member of club or organization: Not on file    Attends meetings of clubs or organizations: Not on file    Relationship status: Not on file  . Intimate partner violence    Fear of current or ex partner: No    Emotionally abused: No    Physically abused: No    Forced sexual activity: No  Other Topics Concern  . Not on file  Social History Narrative  . Not on file    No Known Allergies  Family History  Problem Relation Age of Onset  . CVA Mother   . Hypertension Brother   . Hypertension Sister   . Hypertension Brother   . Stroke Sister   . Heart attack Neg Hx     Prior to Admission medications   Medication Sig Start Date End Date Taking? Authorizing Provider  acetaminophen (TYLENOL) 500 MG tablet Take 1,000 mg by mouth every 6 (six) hours as needed for headache.    [provider]  allopurinol (ZYLOPRIM) 100 MG tablet TAKE ONE TABLET BY MOUTH ONCE DAILY TO PREVENT GOUT 12/23/18   [name], FNP  aspirin EC 81 MG tablet Take 1 tablet (81 mg total) by mouth daily. 03/07/15   Larey Dresser, MD  carvedilol (COREG) 6.25 MG tablet TAKE 1 TABLET (6.25MG ) BY MOUTH TWO TIMES EVERY DAY WITH FOOD 12/23/18   [name], FNP  cloNIDine (CATAPRES) 0.2 MG tablet Take 1 tablet (0.2 mg total) by mouth daily. 12/23/18   [name], FNP  dorzolamide (TRUSOPT) 2 % ophthalmic solution Place 1 drop into both eyes 3 (three) times daily.    Marylynn Pearson, MD  hydrALAZINE (APRESOLINE) 100 MG tablet TAKE 1 TABLET (100 MG TOTAL) BY MOUTH THREE TIMES DAILY. **NEEDS TO CALL FOR OCTOBER APPOINTMENT** 06/11/17   Thompson Grayer, MD  isosorbide mononitrate (IMDUR) 30 MG 24 hr tablet Take 30 mg by mouth daily.    [provider]  LUMIGAN 0.01 % SOLN  02/07/19   [provider]   nitroGLYCERIN (NITROSTAT) 0.4 MG SL tablet Place 0.4 mg under the tongue every 5 (five) minutes as needed for chest pain. X 3 doses    [provider]  potassium chloride (K-DUR) 10 MEQ tablet  03/27/19   [provider]  simvastatin (ZOCOR) 20 MG tablet Take 1 tablet (20 mg total) by mouth at bedtime. 12/23/18   [name],  FNP  Travoprost, BAK Free, (TRAVATAN) 0.004 % SOLN ophthalmic solution Place 1 drop into both eyes at bedtime.    Marylynn Pearson, MD    Physical Exam: Vitals:   05/20/19 0626 05/20/19 0630 05/20/19 1130 05/20/19 1215  BP: (!) 149/115  (!) 161/82 (!) 143/71  Pulse: 67     Resp: 17  15 13   Temp: 98.6 F (37 C)     TempSrc: Oral     SpO2: 100%     Weight:  74.8 kg    Height:  5\' 7"  (1.702 m)       . General:  Appears calm and comfortable and is NAD . Eyes:  Cloudy sclera with apparent visual impairment . ENT:  grossly normal hearing, lips & tongue with mild R tongue deviation . Neck:  no LAD, masses or thyromegaly; no carotid bruits . Cardiovascular:  RRR, no m/r/g. No LE edema.  [name] Kitchen Respiratory:   CTA bilaterally with no wheezes/rales/rhonchi.  Normal respiratory effort. . Abdomen:  soft, NT, ND, NABS . Skin:  no rash or induration seen on limited exam . Musculoskeletal:  LUE hemiparesis, distal > proximal.  Subtle RLE weakness distally. [name] Kitchen Psychiatric:  grossly normal mood and affect, speech fluent and appropriate, AOx3 . Neurologic:  CN 2-12 grossly intact with subtle left facial droop, sensation intact    Radiological Exams on Admission: Ct Angio Head W Or Wo Contrast  Result Date: 05/20/2019 CLINICAL DATA:  Stroke follow-up. Acute posterior right frontal lobe infarct on MRI. Left arm weakness. EXAM: CT ANGIOGRAPHY HEAD AND NECK TECHNIQUE: Multidetector CT imaging of the head and neck was performed using the standard protocol during bolus administration of intravenous contrast. Multiplanar CT image reconstructions and MIPs were obtained to  evaluate the vascular anatomy. Carotid stenosis measurements (when applicable) are obtained utilizing NASCET criteria, using the distal internal carotid diameter as the denominator. CONTRAST:  55mL OMNIPAQUE IOHEXOL 350 MG/ML SOLN COMPARISON:  None. FINDINGS: CTA NECK FINDINGS Aortic arch: Normal variant aortic arch branching pattern with common origin of the brachiocephalic and left common carotid arteries. Mild arch atherosclerosis without arch vessel origin stenosis. Right carotid system: Patent with mild calcified plaque at the carotid bifurcation. No evidence of significant stenosis or dissection. Tortuous proximal common carotid artery. Left carotid system: Patent with mild-to-moderate calcified and soft plaque at the carotid bifurcation. No evidence of significant stenosis or dissection. Tortuous proximal common carotid artery. Vertebral arteries: Patent and codominant without evidence of significant stenosis or dissection. Mildly limited assessment of the left V1 segment due to venous contrast. Skeleton: Moderate to severe multilevel cervical disc and facet degeneration. Other neck: No evidence of cervical lymphadenopathy or mass. Upper chest: Small bilateral pleural effusions. Mild dependent atelectasis in both lungs. Review of the MIP images confirms the above findings CTA HEAD FINDINGS Anterior circulation: The internal carotid arteries are widely patent from skull base to carotid termini. ACAs and MCAs are patent without evidence of proximal branch occlusion or significant proximal stenosis. The right A1 segment is absent. No aneurysm is identified. Posterior circulation: The intracranial vertebral arteries are patent to the basilar. Patent bilateral PICA, left AICA, and bilateral SCA origins are identified. The basilar artery is widely patent. There are fetal origins of both PCAs. PCA branch vessel irregularity is noted bilaterally without flow limiting proximal stenosis. No aneurysm is identified.  Venous sinuses: Patent. Anatomic variants: Absent right A1. Fetal origin of the PCAs. Review of the MIP images confirms the above findings IMPRESSION: 1. No major intracranial  arterial occlusion or significant proximal stenosis. 2. Cervical carotid artery atherosclerosis without significant stenosis. 3. Small bilateral pleural effusions. Electronically Signed   By: Logan Bores M.D.   On: 05/20/2019 13:17   Ct Head Wo Contrast  Result Date: 05/20/2019 CLINICAL DATA:  Per patient "he has had left arm weakness for 2 days"Focal neuro deficit, > 6 hrs, stroke suspected Possible stroke EXAM: CT HEAD WITHOUT CONTRAST TECHNIQUE: Contiguous axial images were obtained from the base of the skull through the vertex without intravenous contrast. COMPARISON:  None. FINDINGS: Brain: No acute intracranial hemorrhage. No focal mass lesion. No CT evidence of acute infarction. No midline shift or mass effect. No hydrocephalus. Basilar cisterns are patent. There are periventricular and subcortical white matter hypodensities. Generalized cortical atrophy. Vascular: No hyperdense vessel or unexpected calcification. Skull: Normal. Negative for fracture or focal lesion. Sinuses/Orbits: Paranasal sinuses and mastoid air cells are clear. Orbits are clear. Other: None. IMPRESSION: 1. No acute intracranial findings. 2. Atrophy and white matter microvascular disease. Electronically Signed   By: Suzy Bouchard M.D.   On: 05/20/2019 07:46   Ct Angio Neck W Or Wo Contrast  Result Date: 05/20/2019 CLINICAL DATA:  Stroke follow-up. Acute posterior right frontal lobe infarct on MRI. Left arm weakness. EXAM: CT ANGIOGRAPHY HEAD AND NECK TECHNIQUE: Multidetector CT imaging of the head and neck was performed using the standard protocol during bolus administration of intravenous contrast. Multiplanar CT image reconstructions and MIPs were obtained to evaluate the vascular anatomy. Carotid stenosis measurements (when applicable) are obtained  utilizing NASCET criteria, using the distal internal carotid diameter as the denominator. CONTRAST:  80mL OMNIPAQUE IOHEXOL 350 MG/ML SOLN COMPARISON:  None. FINDINGS: CTA NECK FINDINGS Aortic arch: Normal variant aortic arch branching pattern with common origin of the brachiocephalic and left common carotid arteries. Mild arch atherosclerosis without arch vessel origin stenosis. Right carotid system: Patent with mild calcified plaque at the carotid bifurcation. No evidence of significant stenosis or dissection. Tortuous proximal common carotid artery. Left carotid system: Patent with mild-to-moderate calcified and soft plaque at the carotid bifurcation. No evidence of significant stenosis or dissection. Tortuous proximal common carotid artery. Vertebral arteries: Patent and codominant without evidence of significant stenosis or dissection. Mildly limited assessment of the left V1 segment due to venous contrast. Skeleton: Moderate to severe multilevel cervical disc and facet degeneration. Other neck: No evidence of cervical lymphadenopathy or mass. Upper chest: Small bilateral pleural effusions. Mild dependent atelectasis in both lungs. Review of the MIP images confirms the above findings CTA HEAD FINDINGS Anterior circulation: The internal carotid arteries are widely patent from skull base to carotid termini. ACAs and MCAs are patent without evidence of proximal branch occlusion or significant proximal stenosis. The right A1 segment is absent. No aneurysm is identified. Posterior circulation: The intracranial vertebral arteries are patent to the basilar. Patent bilateral PICA, left AICA, and bilateral SCA origins are identified. The basilar artery is widely patent. There are fetal origins of both PCAs. PCA branch vessel irregularity is noted bilaterally without flow limiting proximal stenosis. No aneurysm is identified. Venous sinuses: Patent. Anatomic variants: Absent right A1. Fetal origin of the PCAs. Review of  the MIP images confirms the above findings IMPRESSION: 1. No major intracranial arterial occlusion or significant proximal stenosis. 2. Cervical carotid artery atherosclerosis without significant stenosis. 3. Small bilateral pleural effusions. Electronically Signed   By: Logan Bores M.D.   On: 05/20/2019 13:17   Mr Brain Wo Contrast  Result Date: 05/20/2019 CLINICAL DATA:  Left arm weakness. EXAM: MRI HEAD WITHOUT CONTRAST TECHNIQUE: Multiplanar, multiecho pulse sequences of the brain and surrounding structures were obtained without intravenous contrast. COMPARISON:  Head CT 05/20/2019 and MRI 02/23/2014 FINDINGS: The patient was unable to tolerate the examination which was terminated prematurely. Axial and coronal diffusion sequences and an axial FLAIR sequence were obtained and are severely motion degraded. There is a small region of diffusion signal abnormality involving cortex and white matter in the posterior right frontal lobe consistent with an acute infarct. No gross intracranial hemorrhage, intracranial mass effect, or extra-axial fluid collection is identified. Background moderate chronic small vessel ischemic changes are not well evaluated. There is mild-to-moderate cerebral atrophy. IMPRESSION: 1. Severely motion degraded, incomplete examination. 2. Small acute posterior right frontal lobe infarct. Electronically Signed   By: Logan Bores M.D.   On: 05/20/2019 10:11    EKG: Independently reviewed.  NSR with rate 57; RBBB; no evidence of acute ischemia   Labs on Admission: I have personally reviewed the available labs and imaging studies at the time of the admission.  Pertinent labs:   CO2 19 BUN 19/Creatinine 1.99/GFR 35 - stable from 11/21/18 WBC 4.5 Hgb 10.7; 11.7 on 07/22/18 INR 1.1   Assessment/Plan Principal Problem:   Ischemic cerebrovascular accident (CVA) of frontal lobe (HCC) Active Problems:   Essential hypertension   CKD (chronic kidney disease) stage 3, GFR 30-59  ml/min   Dyslipidemia   Chronic diastolic CHF (congestive heart failure) ([place])   CVA -Patient with left hemiparesis, suggestive of CVA -CT and CTA unremarkable, but MRI shows acute R posterior frontal lobe infarct -Will admit for further evaluation -Telemetry monitoring -Echo -ASA daily -Neurology consult -PT/OT/ST/Nutrition Consults -CIR consult for placement  HTN -Allow permissive HTN for now -Treat BP only if >220/120, and then with goal of 15% reduction -Hold Coreg, Catapres, Hydralazine and plan to restart in 48-72 hours   HLD -Check FLP -Resume statin but will change Zocor to Lipitor 40 mg daily   Stage 3 CKD -Appears to be stable at this time -Will follow  Chronic diastolic CHF -Echo pending; last echo was in 2015 and result is not currently available in Epic -Appears to be compensated at this time   Note: This patient has been tested and is pending for the novel coronavirus COVID-19.      DVT prophylaxis:  Lovenox  Code Status: DNR - confirmed with patient/family Family Communication: Sister present throughout evaluation Disposition Plan:  Home once clinically improved Consults called: Neurology; PT/OT/ST/Nutrition/CIR  Admission status: Admit - It is my clinical opinion that admission to INPATIENT is reasonable and necessary because of the expectation that this patient will require hospital care that crosses at least 2 midnights to treat this condition based on the medical complexity of the problems presented.  Given the aforementioned information, the predictability of an adverse outcome is felt to be significant.     Karmen Bongo MD [facility]   How to contact the [facility] Attending or Consulting provider [place] or covering provider during after hours [place], for this patient?  1. Check the care team in [facility] and look for a) attending/consulting [facility] provider listed and b) the [facility] team listed 2. Log into [email or web address] and use [place]'s universal  password to access. If you do not have the password, please contact the hospital operator. 3. Locate the [facility] provider you are looking for under [facility] and page to a number that you can be directly reached. 4. If you  still have difficulty reaching the provider, please page the [facility] (Director on Call) for the Hospitalists listed on amion for assistance.   05/20/2019, 1:23 PM

## 2019-05-21 DIAGNOSIS — I10 Essential (primary) hypertension: Secondary | ICD-10-CM

## 2019-05-21 DIAGNOSIS — N183 Chronic kidney disease, stage 3 unspecified: Secondary | ICD-10-CM

## 2019-05-21 DIAGNOSIS — I5032 Chronic diastolic (congestive) heart failure: Secondary | ICD-10-CM

## 2019-05-21 LAB — ECHOCARDIOGRAM COMPLETE
Height: 67 in
Weight: 2640 oz

## 2019-05-21 LAB — LIPID PANEL
Cholesterol: 108 mg/dL (ref 0–200)
HDL: 28 mg/dL — ABNORMAL LOW (ref >40)
LDL Cholesterol: 71 mg/dL (ref 0–99)
Total CHOL/HDL Ratio: 3.9 RATIO
Triglycerides: 44 mg/dL (ref <150)
VLDL: 9 mg/dL (ref 0–40)

## 2019-05-21 MED ORDER — DOCUSATE SODIUM 100 MG PO CAPS
100.0000 mg | ORAL_CAPSULE | Freq: Two times a day (BID) | ORAL | Status: DC
  Administered 2019-05-21 – 2019-05-22 (×3): 100 mg via ORAL
  Filled 2019-05-21 (×3): qty 1

## 2019-05-21 MED ORDER — ASPIRIN EC 325 MG PO TBEC
325.0000 mg | DELAYED_RELEASE_TABLET | Freq: Every day | ORAL | Status: DC
  Administered 2019-05-21: 325 mg via ORAL
  Filled 2019-05-21: qty 1

## 2019-05-21 MED ORDER — CLOPIDOGREL BISULFATE 75 MG PO TABS
75.0000 mg | ORAL_TABLET | Freq: Every day | ORAL | Status: DC
  Administered 2019-05-21 – 2019-05-22 (×2): 75 mg via ORAL
  Filled 2019-05-21 (×2): qty 1

## 2019-05-21 MED ORDER — ASPIRIN 81 MG PO CHEW
81.0000 mg | CHEWABLE_TABLET | Freq: Every day | ORAL | Status: DC
  Administered 2019-05-22: 81 mg via ORAL
  Filled 2019-05-21: qty 1

## 2019-05-21 MED ORDER — ATORVASTATIN CALCIUM 80 MG PO TABS
80.0000 mg | ORAL_TABLET | Freq: Every day | ORAL | Status: DC
  Administered 2019-05-21 – 2019-05-22 (×2): 80 mg via ORAL
  Filled 2019-05-21 (×2): qty 1

## 2019-05-21 MED ORDER — CARVEDILOL 6.25 MG PO TABS
6.2500 mg | ORAL_TABLET | Freq: Two times a day (BID) | ORAL | Status: DC
  Administered 2019-05-21 – 2019-05-22 (×3): 6.25 mg via ORAL
  Filled 2019-05-21 (×4): qty 1

## 2019-05-21 MED ORDER — FERROUS SULFATE 325 (65 FE) MG PO TABS
325.0000 mg | ORAL_TABLET | Freq: Two times a day (BID) | ORAL | Status: DC
  Administered 2019-05-21 – 2019-05-22 (×4): 325 mg via ORAL
  Filled 2019-05-21 (×4): qty 1

## 2019-05-21 NOTE — Evaluation (Signed)
Speech Language Pathology Evaluation Patient Details Name: Trevor Murphy MRN: [id number] DOB: 1934-11-11 Today's Date: 05/21/2019 Time: 1040-1100 SLP Time Calculation (min) (ACUTE ONLY): 20 min  Problem List:  Patient Active Problem List   Diagnosis Date Noted  . Ischemic cerebrovascular accident (CVA) of frontal lobe ([place]) 05/20/2019  . Pain due to onychomycosis of toenails of both feet 01/20/2019  . Prediabetes 07/22/2018  . Nail abnormality 07/22/2018  . Sinus bradycardia 05/02/2016  . Hypotension due to drugs 05/02/2016  . History of tachycardia 05/02/2016  . GERD (gastroesophageal reflux disease) 02/05/2015  . Mild coronary artery disease 04/20/2014  . Atypical chest pain 04/12/2014  . Hypertensive urgency 03/11/2014  . Chronic diastolic CHF (congestive heart failure) ([place]) 01/30/2014  . Alcohol abuse 01/19/2014  . SVT (supraventricular tachycardia) ([place]) 12/19/2013  . Elevated troponin 12/19/2013  . Gout 12/19/2013  . Dyslipidemia 12/16/2013  . Hypertensive crisis 12/16/2013  . CVA (cerebral infarction) 07/28/2012  . Acute on chronic renal failure ([place]) 07/27/2012  . Essential hypertension 07/26/2012  . CKD (chronic kidney disease) stage 3, GFR 30-59 ml/min 07/26/2012  . Poor circulation   . Recurrent ventral hernia 02/16/2012  . Hernia (acquired) (recurrent) 02/10/2011   Past Medical History:  Past Medical History:  Diagnosis Date  . Arthritis   . CAD (coronary artery disease)    a. Cath 08/2009: minimal nonobstructive disease (25% LAD). b. Lexiscan nuc 6/215: EF 54%, no ischemia or infarction.  . Chronic diastolic CHF (congestive heart failure) ([place])    a. Echo 2013: EF 55-60%, mild AI, mild MR. b. Echo 5/15 with EF 60-65%, no WMA, mild LVH, mild AI.  [name] Kitchen CKD (chronic kidney disease) stage 3, GFR 30-59 ml/min 07/26/2012   a. Felt likely due to HTN.  . CVA (cerebral infarction) 07/28/2012  . Glaucoma   . Gout   . H/O umbilical hernia repair   . Headache(784.0)     " WHEN MY BLOOD PRESSURE IS UP"  . History of acute renal failure   . History of alcohol abuse   . HTN (hypertension)   . Hypertension    a. Patent renal arteries 2005.  [name] Kitchen Noncompliance   . OA (osteoarthritis)   . Other and unspecified hyperlipidemia   . Poor circulation   . Sinus bradycardia   . SVT (supraventricular tachycardia) ([place])    a. Possible AVNRT.  Got adenosine from EMS in 2015 for SVT.  During 5/15 hospitalization had episode of short R-P tachycardia (possible AVNRT) terminated by adenosine.  b. 04/2016 - recurrent tachycardia (SVT vs. AVNRT), again terminated by Adenosine  . Visual impairment    Past Surgical History:  Past Surgical History:  Procedure Laterality Date  . APPENDECTOMY    . CARDIAC CATHETERIZATION  11/2003   EF 50-55%  . CARDIAC CATHETERIZATION  08/2009  . CATARACT EXTRACTION EXTRACAPSULAR Right 05/17/2013   Procedure: CATARACT EXTRACTION EXTRACAPSULAR WITH INTRAOCULAR LENS PLACEMENT (IOC) RIGHT EYE;  Surgeon: Marylynn Pearson, MD;  Location: [facility];  Service: Ophthalmology;  Laterality: Right;  . COLONOSCOPY    . HERNIA REPAIR    . VENTRAL HERNIA REPAIR  03/07/2012   Procedure: LAPAROSCOPIC VENTRAL HERNIA;  Surgeon: Gwenyth Ober, MD;  Location: [facility];  Service: General;  Laterality: N/A;   HPI:  HERNY SCURLOCK is a 84 y.o. male with medical history significant of SVT; HLD; HTN; ETOH abuse; CVA (2013); stage 3 CKD; CAD; and chronic diastolic CHF presenting with left-sided weakness.  He reports that he was having difficulty using  his left hand, "it just went dead on me."  This happened 2 days ago.  He thought it would go away and it didn't.  He did not notice any problems with the leg at first, but his caregiver called his sister this AM to report that he was dragging the leg today.  No dysphagia or dysarthria.  No h/o dementia.  He complained of headache to his caregiver but he reports not having a headache.  He has severe visual disturbance and so because of this he  has a caregiver.  MRI was completed and was motion degraded with exam being incomplete but showing small acute posterior right frontal lobe infarct.     Assessment / Plan / Recommendation Clinical Impression  Cognitive/linguistic and motor speech evaluation were completed.  Cranial nerve exam was completed and unremarkable.  Lingual, labial, facial and jaw range of motion and strength were adequate.  Facial sensation appeared to be intact and the patient did not endorse any difference in sensation between the left and right side of his face.  Speech intelligibility was mildly reduced particularly as utterance length increased and the patient did endorse some changes to the quality of his speech suggestive of a mild dysarthria.  He appeared to have good breath support as evidenced by his ability to sustain an "ah" for 20 seconds and an s/z ratio of 1.1.  He was able to produce an /s/ for 11 seconds and a /z/ for 10 seconds.  Pitch breaks and variation were noted throughout.  Mildly reduced articulatory precision was noted during alternating motor movements (ie diadochokinetic rates).   He achieved an overall score of 22 out of a possible 27 points on the [place] Exam (ie reading, writing and copying portions were not administered due to baseline visual deficit).  He was oriented to person, place and situation.  He was partiallly oriented to time knowing the year and day of the week but not the month or date.  Immediate and delayed recall of 3 novel words was good and attention to task appeared to be adequate.  Language appeared to be relatively intact.  Mild issues repeating a short sentence were noted.  He was able to provide logical solutions to simple problems.  He lives alone but has a caretaker that is in the home 8am-5pm every day.  They manage his medications, cooking and light house keeping duties.  His son handles his finances.  He has good family support from a son and a sister.  Suspect that  the mild issues noted on the Mini Mental are baseline.  ST will follow during acute stay to address mild dysarthria.   He may benefit from continued ST at the next level of care.      SLP Assessment  SLP Recommendation/Assessment: Patient needs continued Speech Lanaguage Pathology Services SLP Visit Diagnosis: Dysarthria and anarthria (R47.1)    Follow Up Recommendations  Home health SLP    Frequency and Duration min 1 x/week  2 weeks      SLP Evaluation Cognition  Overall Cognitive Status: Within Functional Limits for tasks assessed Arousal/Alertness: Awake/alert Orientation Level: Oriented to person;Oriented to place;Oriented to situation;Disoriented to time Attention: Focused Focused Attention: Appears intact Memory: Appears intact Problem Solving: Appears intact Safety/Judgment: Appears intact       Comprehension  Auditory Comprehension Commands: Within Functional Limits Conversation: Simple Reading Comprehension Reading Status: Within funtional limits    Expression Expression Primary Mode of Expression: Verbal Verbal Expression Overall Verbal  Expression: Appears within functional limits for tasks assessed Initiation: No impairment Automatic Speech: Name;Social Response Level of Generative/Spontaneous Verbalization: Sentence Naming: No impairment Pragmatics: No impairment Written Expression Written Expression: Not tested   Oral / Motor  Oral Motor/Sensory Function Overall Oral Motor/Sensory Function: Within functional limits Motor Speech Overall Motor Speech: Impaired Respiration: Within functional limits Phonation: Normal Articulation: Impaired Level of Impairment: Sentence Intelligibility: Intelligibility reduced Word: 75-100% accurate Phrase: 75-100% accurate Sentence: 50-74% accurate Conversation: 50-74% accurate Motor Planning: Witnin functional limits Motor Speech Errors: Not applicable   GO                    Shelly Flatten, MA,  CCC-SLP Acute Rehab SLP [phone]  Lamar Sprinkles 05/21/2019, 11:16 AM

## 2019-05-21 NOTE — Evaluation (Addendum)
Physical Therapy Evaluation Patient Details Name: Trevor Murphy MRN: [id number] DOB: 07-Feb-1935 Today's Date: 05/21/2019   History of Present Illness  Pt is a 84 y.o. male s/p Acute R posterior frontal lobe infarct with medical history significant of blind L eye, SVT; HLD; HTN; ETOH abuse; CVA (2013); stage 3 CKD; CAD; and chronic diastolic CHF presenting with left-sided weakness.    Clinical Impression  Pt admitted with above. Prior to admission, pt lives alone with assist for ADL's from caregiver/family and uses wheelchair mainly for mobility, but also performs household ambulation with Rollator. He has visual deficits at baseline. Currently, pt requiring min assist for functional mobility. Ambulating 20 feet with walker and directional guidance. Presents with left upper extremity weakness, decreased coordination, increased edema, and left sided sensation deficits. Recommending HHPT with 24/7 assist; pt son reported they would be able to provide. Pt will likely do well in familiar environment.     Follow Up Recommendations Home health PT;Supervision/Assistance - 24 hour    Equipment Recommendations  None recommended by PT    Recommendations for Other Services       Precautions / Restrictions Precautions Precautions: Fall Restrictions Weight Bearing Restrictions: No      Mobility  Bed Mobility Overal bed mobility: Needs Assistance Bed Mobility: Supine to Sit;Sit to Supine     Supine to sit: Supervision Sit to supine: Supervision   General bed mobility comments: for safety  Transfers Overall transfer level: Needs assistance Equipment used: Rolling walker (2 wheeled) Transfers: Sit to/from Stand Sit to Stand: Min assist         General transfer comment: minA for power up  Ambulation/Gait Ambulation/Gait assistance: Min assist Gait Distance (Feet): 20 Feet Assistive device: Rolling walker (2 wheeled) Gait Pattern/deviations: Step-through pattern;Trunk  flexed;Decreased stride length     General Gait Details: Verbal cueing for navigation/direction, manual assist for walker turns  Stairs            Wheelchair Mobility    Modified Rankin (Stroke Patients Only) Modified Rankin (Stroke Patients Only) Pre-Morbid Rankin Score: Moderately severe disability Modified Rankin: Moderately severe disability     Balance Overall balance assessment: Needs assistance   Sitting balance-Leahy Scale: Good       Standing balance-Leahy Scale: Poor Standing balance comment: reliant on RW                             Pertinent Vitals/Pain Pain Assessment: No/denies pain    Home Living Family/patient expects to be discharged to:: Private residence Living Arrangements: Alone Available Help at Discharge: Family;Personal care attendant Type of Home: Apartment Home Access: Level entry     Home Layout: One level Home Equipment: Wheelchair - Insurance claims handler - 4 wheels      Prior Function Level of Independence: Needs assistance   Gait / Transfers Assistance Needed: w/c mostly; rollator at times  ADL's / Homemaking Assistance Needed: caregiver M-F for 5 hours daily- son and sister assists on weekends  Comments: nearly blind     Hand Dominance   Dominant Hand: Right    Extremity/Trunk Assessment   Upper Extremity Assessment Upper Extremity Assessment: Generalized weakness LUE Deficits / Details: 3-/5 MM grade compared to LUE  LUE Coordination: decreased fine motor    Lower Extremity Assessment Lower Extremity Assessment: RLE deficits/detail;LLE deficits/detail RLE Deficits / Details: Strength 5/5 LLE Deficits / Details: Strength 5/5 LLE Sensation: decreased light touch    Cervical / Trunk Assessment  Cervical / Trunk Assessment: Kyphotic  Communication   Communication: No difficulties  Cognition Arousal/Alertness: Awake/alert Behavior During Therapy: WFL for tasks assessed/performed Overall  Cognitive Status: Within Functional Limits for tasks assessed                                        General Comments General comments (skin integrity, edema, etc.): Pt's son in room for eval    Exercises     Assessment/Plan    PT Assessment Patient needs continued PT services  PT Problem List Decreased strength;Decreased range of motion;Decreased balance;Decreased mobility;Decreased coordination;Impaired sensation       PT Treatment Interventions DME instruction;Gait training;Functional mobility training;Therapeutic activities;Therapeutic exercise;Balance training;Patient/family education    PT Goals (Current goals can be found in the Care Plan section)  Acute Rehab PT Goals Patient Stated Goal: to go home PT Goal Formulation: With patient/family Time For Goal Achievement: 06/04/19 Potential to Achieve Goals: Fair    Frequency Min 4X/week   Barriers to discharge        Co-evaluation               AM-PAC PT "6 Clicks" Mobility  Outcome Measure Help needed turning from your back to your side while in a flat bed without using bedrails?: A Little Help needed moving from lying on your back to sitting on the side of a flat bed without using bedrails?: A Little Help needed moving to and from a bed to a chair (including a wheelchair)?: A Little Help needed standing up from a chair using your arms (e.g., wheelchair or bedside chair)?: A Little Help needed to walk in hospital room?: A Little Help needed climbing 3-5 steps with a railing? : A Lot 6 Click Score: 17    End of Session Equipment Utilized During Treatment: Gait belt Activity Tolerance: Patient tolerated treatment well Patient left: in bed;with call bell/phone within reach;with family/visitor present Nurse Communication: Mobility status PT Visit Diagnosis: Unsteadiness on feet (R26.81);Difficulty in walking, not elsewhere classified (R26.2);Other symptoms and signs involving the nervous system  (R29.898)    Time: 8850-2774 PT Time Calculation (min) (ACUTE ONLY): 28 min   Charges:   PT Evaluation $PT Eval Moderate Complexity: 1 Mod          Ellamae Sia, [place], DPT Acute Rehabilitation Services Pager [phone] Office [phone]   Willy Eddy 05/21/2019, 5:03 PM

## 2019-05-21 NOTE — Evaluation (Signed)
Occupational Therapy Evaluation Patient Details Name: Trevor Murphy MRN: [id number] DOB: 1935-04-07 Today's Date: 05/21/2019    History of Present Illness Pt is a 84 y.o. male s/p Acute R posterior frontal lobe infarct with medical history significant of blind L eye, SVT; HLD; HTN; ETOH abuse; CVA (2013); stage 3 CKD; CAD; and chronic diastolic CHF presenting with left-sided weakness.     Clinical Impression   Pt PTA: living alone with caregiver 5 hours daily. Pt with very supportive family who visits pt on weekends. Pt currently, pt performing ADL functional mobility with minA for stability in standing and for guiding pt. Pt limited by poor vision at baseline- able to scan, but gets distracted and vision appears worse on R side; LUE weakness/poor coordination and inability to properly care for self without assist. Pt would benefit from continued OT skilled services for ADL, LUE HEP and safety in [place] setting. OT following acutely.      Follow Up Recommendations  Home health OT;Supervision/Assistance - 24 hour    Equipment Recommendations  None recommended by OT    Recommendations for Other Services       Precautions / Restrictions Precautions Precautions: Fall Restrictions Weight Bearing Restrictions: No      Mobility Bed Mobility Overal bed mobility: Needs Assistance Bed Mobility: Supine to Sit;Sit to Supine     Supine to sit: Supervision Sit to supine: Supervision   General bed mobility comments: for safety  Transfers Overall transfer level: Needs assistance Equipment used: Rolling walker (2 wheeled) Transfers: Sit to/from Stand Sit to Stand: Min assist         General transfer comment: minA for power up    Balance Overall balance assessment: Needs assistance   Sitting balance-Leahy Scale: Good       Standing balance-Leahy Scale: Poor Standing balance comment: reliant on RW                           ADL either performed or assessed with  clinical judgement   ADL Overall ADL's : Needs assistance/impaired Eating/Feeding: Set up;Sitting Eating/Feeding Details (indicate cue type and reason): due to poor vision Grooming: Min guard;Standing   Upper Body Bathing: Min guard;Sitting   Lower Body Bathing: Min guard;Sitting/lateral leans;Sit to/from stand   Upper Body Dressing : Min guard;Sitting   Lower Body Dressing: Min guard;Sitting/lateral leans;Sit to/from stand;Cueing for safety   Toilet Transfer: Airline pilot and Hygiene: Minimal assistance;Sitting/lateral lean;Sit to/from stand       Functional mobility during ADLs: Min guard;Rolling walker General ADL Comments: Pt limited by decreased strength in LUE and visual deficits at baseline.     Vision Baseline Vision/History: Wears glasses Wears Glasses: At all times Patient Visual Report: No change from baseline Vision Assessment?: Vision impaired- to be further tested in functional context;Yes Eye Alignment: Within Functional Limits Ocular Range of Motion: Within Functional Limits Alignment/Gaze Preference: Within Defined Limits Additional Comments: nearly blind at baseline- can see shadows     Perception     Praxis      Pertinent Vitals/Pain Pain Assessment: No/denies pain     Hand Dominance Right   Extremity/Trunk Assessment Upper Extremity Assessment Upper Extremity Assessment: Generalized weakness;LUE deficits/detail LUE Deficits / Details: 3-/5 MM grade compared to LUE  LUE Coordination: decreased fine motor   Lower Extremity Assessment Lower Extremity Assessment: Defer to PT evaluation   Cervical / Trunk Assessment Cervical / Trunk Assessment: Kyphotic  Communication Communication Communication: No difficulties   Cognition Arousal/Alertness: Awake/alert Behavior During Therapy: WFL for tasks assessed/performed Overall Cognitive Status: Within Functional Limits for tasks assessed                                      General Comments  Pt's son in room for eval    Exercises     Shoulder Instructions      Home Living Family/patient expects to be discharged to:: Private residence Living Arrangements: Alone Available Help at Discharge: Family;Personal care attendant Type of Home: Apartment Home Access: Level entry     Home Layout: One level     Bathroom Shower/Tub: Teacher, early years/pre: [place]: Wheelchair - Insurance claims handler - 4 wheels      Lives With: Alone    Prior Functioning/Environment Level of Independence: Needs assistance  Gait / Transfers Assistance Needed: w/c mostly; rollator at times ADL's / Homemaking Assistance Needed: caregiver M-F for 5 hours daily- son and sister assists on weekends   Comments: nearly blind        OT Problem List: Decreased strength;Decreased activity tolerance;Impaired balance (sitting and/or standing);Impaired UE functional use      OT Treatment/Interventions: Self-care/ADL training;Therapeutic exercise;Neuromuscular education;Energy conservation;Patient/family education;Balance training;Therapeutic activities    OT Goals(Current goals can be found in the care plan section) Acute Rehab OT Goals Patient Stated Goal: to go home OT Goal Formulation: With patient Time For Goal Achievement: 06/04/19 Potential to Achieve Goals: Good ADL Goals Pt/caregiver will Perform Home Exercise Program: Increased strength;Left upper extremity;With written HEP provided;With Supervision Additional ADL Goal #1: Pt will increase to set-upA for OOB ADL with cues for guiding pt due to poor vision.  OT Frequency: Min 2X/week   Barriers to D/C:            Co-evaluation              AM-PAC OT "6 Clicks" Daily Activity     Outcome Measure Help from another person eating meals?: A Little Help from another person taking care of personal grooming?: A Little Help from another  person toileting, which includes using toliet, bedpan, or urinal?: A Little Help from another person bathing (including washing, rinsing, drying)?: A Little Help from another person to put on and taking off regular upper body clothing?: A Little Help from another person to put on and taking off regular lower body clothing?: A Little 6 Click Score: 18   End of Session Equipment Utilized During Treatment: Gait belt;Rolling walker Nurse Communication: Mobility status  Activity Tolerance: Patient tolerated treatment well Patient left: in bed;with call bell/phone within reach;with bed alarm set  OT Visit Diagnosis: Unsteadiness on feet (R26.81);Muscle weakness (generalized) (M62.81)                Time: 2035-5974 OT Time Calculation (min): 29 min Charges:  OT General Charges $OT Visit: 1 Visit OT Evaluation $OT Eval Moderate Complexity: 1 Mod  Darryl Nestle) Marsa Aris OTR/L Acute Rehabilitation Services Pager: [phone] Office: [phone]   Audie Pinto 05/21/2019, 4:18 PM

## 2019-05-21 NOTE — Progress Notes (Signed)
PROGRESS NOTE    Trevor Murphy  EPP:[id number] DOB: 03/10/1935 DOA: 05/20/2019 PCP: Glendale Chard, MD      Brief Narrative:  Trevor Murphy is a 84 y.o. M with dCHF, HTN, gout, CKD III baseline Cr 1.5-1.9, prior CVA without residual deficits and parox SVT who presented with 2 days left sided weakness.  In the ER, CT head unremarkable.   Admitted for MRI brain and likely stroke.      Assessment & Plan:  Acute RIGHT MCA territory infarct Presented with 2 days left hemiparesis.  MRI showed acute right frontal stroke. -Non-invasive angiography showed no significant atherosclerosis -Echocardiogram showed no cardiac source of emboli -Carotid imaging unremarkable   -Lipids ordered: simva 20  --> atorva 80 -Aspirin ordered at admission --> aspirin 325 mg  -Atrial fibrillation: not on tele -tPA not given because outside stroke window -Dysphagia screen ordered in ER -PT eval ordered -Smoking cessation: former smoker, since quit     Chronic diastolic CHF Hypertension BP elevated. -Hold hydralazine, clonidine -Restart carvedilol to begin gradual BP reduction  CKD III  Baseline Cr appears to be between 1.5 and 2.   Stable relative to baseline.  Gout No active disease -Continue allopurinol  Anemia, microcytic Previously iron deficient. -Check ferritin, iron studies         MDM and disposition: The below labs and imaging reports were reviewed and summarized above.  Medication management as above.  The patient was admitted with acute stroke.  He is currently weak on the right side.  At baseline, he is totally independent for self-cares, but at present requires assistance for mobility, toileting, bathing.         DVT prophylaxis: Lovenox Code Status: DO NOT RESUSCITATE Family Communication: Son by phone    Consultants:   Neurology  Procedures:   10/10 CT head  10/10 MRI brain  10/10 CTA head and neck  10/10 echo IMPRESSIONS    1. Left  ventricular ejection fraction, by visual estimation, is 55 to 60%. The left ventricle has normal function. Normal left ventricular size. There is severely increased left ventricular hypertrophy.  2. Left ventricular diastolic Doppler parameters are consistent with impaired relaxation pattern of LV diastolic filling.  3. Global right ventricle has normal systolic function.The right ventricular size is normal. No increase in right ventricular wall thickness.  4. Left atrial size was normal.  5. Right atrial size was normal.  6. The mitral valve is normal in structure. Mild mitral valve regurgitation. No evidence of mitral stenosis.  7. The tricuspid valve is normal in structure. Tricuspid valve regurgitation was not visualized by color flow Doppler.  8. The aortic valve is normal in structure. Aortic valve regurgitation is mild by color flow Doppler. Mild to moderate aortic valve sclerosis/calcification without any evidence of aortic stenosis.  9. The pulmonic valve was normal in structure. Pulmonic valve regurgitation is not visualized by color flow Doppler. 10. The inferior vena cava is normal in size with greater than 50% respiratory variability, suggesting right atrial pressure of 3 mmHg.     Subjective: No chest pain, fever, confusion.  His left arm is puffy but no pain, paralysis.  No vomiting, diarrhea.  Left sided weakness persists.  Objective: Vitals:   05/21/19 0411 05/21/19 0829 05/21/19 1211 05/21/19 1518  BP: (!) 163/66 (!) 208/77 (!) 167/86 (!) 166/81  Pulse: 66 (!) 52 60 60  Resp: 18 18 17 12   Temp: 97.7 F (36.5 C) 97.8 F (36.6 C) 97.6 F (36.4  C) 98 F (36.7 C)  TempSrc: Oral Oral Oral Oral  SpO2: 99% 100% 99% 100%  Weight:      Height:        Intake/Output Summary (Last 24 hours) at 05/21/2019 1537 Last data filed at 05/21/2019 1500 Gross per 24 hour  Intake 1000 ml  Output 400 ml  Net 600 ml   Filed Weights   05/20/19 0630  Weight: 74.8 kg     Examination: General appearance:  adult male, alert and in no acute distress.   HEENT: Anicteric, conjunctiva pink, lids and lashes normal. No nasal deformity, discharge, epistaxis.  Lips moist.   Skin: Warm and dry.  no jaundice.  No suspicious rashes or lesions. Cardiac: RRR, nl S1-S2, no murmurs appreciated.  Capillary refill is brisk.  JVP normal.  No LE edema.  Radial pulses 2+ and symmetric. Respiratory: Normal respiratory rate and rhythm.  CTAB without rales or wheezes. Abdomen: Abdomen soft.  No TTP orguarding. No ascites, distension, hepatosplenomegaly.   MSK: No deformities or effusions. Neuro: Awake and alert.  EOMI, right sided strength 5/5, left sided strength 4/5.  Cranial nerves normal. Speech fluent.    Psych: Sensorium intact and responding to questions, attention normal. Affect blunted.  Judgment and insight appear impaired by dementia.    Data Reviewed: I have personally reviewed following labs and imaging studies:  CBC: Recent Labs  Lab 05/20/19 0630  WBC 4.5  NEUTROABS 2.6  HGB 10.7*  HCT 36.7*  MCV 72.2*  PLT 096   Basic Metabolic Panel: Recent Labs  Lab 05/20/19 0630  NA 137  K 5.1  CL 108  CO2 19*  GLUCOSE 99  BUN 19  CREATININE 1.99*  CALCIUM 8.4*   GFR: Estimated Creatinine Clearance: 26.8 mL/min (A) (by C-G formula based on SCr of 1.99 mg/dL (H)). Liver Function Tests: Recent Labs  Lab 05/20/19 0630  AST 18  ALT 18  ALKPHOS 68  BILITOT 0.5  PROT 7.3  ALBUMIN 3.5   No results for input(s): LIPASE, AMYLASE in the last 168 hours. No results for input(s): AMMONIA in the last 168 hours. Coagulation Profile: Recent Labs  Lab 05/20/19 0749  INR 1.1   Cardiac Enzymes: No results for input(s): CKTOTAL, CKMB, CKMBINDEX, TROPONINI in the last 168 hours. BNP (last 3 results) No results for input(s): PROBNP in the last 8760 hours. HbA1C: No results for input(s): HGBA1C in the last 72 hours. CBG: No results for input(s): GLUCAP in  the last 168 hours. Lipid Profile: Recent Labs    05/21/19 0422  CHOL 108  HDL 28*  LDLCALC 71  TRIG 44  CHOLHDL 3.9   Thyroid Function Tests: No results for input(s): TSH, T4TOTAL, FREET4, T3FREE, THYROIDAB in the last 72 hours. Anemia Panel: No results for input(s): VITAMINB12, FOLATE, FERRITIN, TIBC, IRON, RETICCTPCT in the last 72 hours. Urine analysis:    Component Value Date/Time   COLORURINE STRAW (A) 05/20/2019 1040   APPEARANCEUR CLEAR 05/20/2019 1040   LABSPEC 1.004 (L) 05/20/2019 1040   PHURINE 6.0 05/20/2019 1040   GLUCOSEU NEGATIVE 05/20/2019 1040   HGBUR LARGE (A) 05/20/2019 1040   BILIRUBINUR NEGATIVE 05/20/2019 [place] 05/20/2019 1040   PROTEINUR NEGATIVE 05/20/2019 1040   UROBILINOGEN 0.2 07/08/2014 0333   NITRITE NEGATIVE 05/20/2019 1040   LEUKOCYTESUR NEGATIVE 05/20/2019 1040   Sepsis Labs: @LABRCNTIP (procalcitonin:4,lacticacidven:4)  ) Recent Results (from the past 240 hour(s))  SARS CORONAVIRUS 2 (TAT 6-24 HRS) Nasopharyngeal Nasopharyngeal Swab  Status: None   Collection Time: 05/20/19 10:23 AM   Specimen: Nasopharyngeal Swab  Result Value Ref Range Status   SARS Coronavirus 2 NEGATIVE NEGATIVE Final    Comment: (NOTE) SARS-CoV-2 target nucleic acids are NOT DETECTED. The SARS-CoV-2 RNA is generally detectable in upper and lower respiratory specimens during the acute phase of infection. Negative results do not preclude SARS-CoV-2 infection, do not rule out co-infections with other pathogens, and should not be used as the sole basis for treatment or other patient management decisions. Negative results must be combined with clinical observations, patient history, and epidemiological information. The expected result is Negative. Fact Sheet for Patients: SugarRoll.be Fact Sheet for Healthcare Providers: https://www.woods-mathews.com/ This test is not yet approved or cleared by the  [place] FDA and  has been authorized for detection and/or diagnosis of SARS-CoV-2 by FDA under an Emergency Use Authorization (EUA). This EUA will remain  in effect (meaning this test can be used) for the duration of the COVID-19 declaration under Section 56 4(b)(1) of the Act, 21 U.S.C. section 360bbb-3(b)(1), unless the authorization is terminated or revoked sooner. Performed at [facility], [place] [address] [place], [place] [address]          Radiology Studies: Ct Angio Head W Or Wo Contrast  Result Date: 05/20/2019 CLINICAL DATA:  Stroke follow-up. Acute posterior right frontal lobe infarct on MRI. Left arm weakness. EXAM: CT ANGIOGRAPHY HEAD AND NECK TECHNIQUE: Multidetector CT imaging of the head and neck was performed using the standard protocol during bolus administration of intravenous contrast. Multiplanar CT image reconstructions and MIPs were obtained to evaluate the vascular anatomy. Carotid stenosis measurements (when applicable) are obtained utilizing NASCET criteria, using the distal internal carotid diameter as the denominator. CONTRAST:  37mL OMNIPAQUE IOHEXOL 350 MG/ML SOLN COMPARISON:  None. FINDINGS: CTA NECK FINDINGS Aortic arch: Normal variant aortic arch branching pattern with common origin of the brachiocephalic and left common carotid arteries. Mild arch atherosclerosis without arch vessel origin stenosis. Right carotid system: Patent with mild calcified plaque at the carotid bifurcation. No evidence of significant stenosis or dissection. Tortuous proximal common carotid artery. Left carotid system: Patent with mild-to-moderate calcified and soft plaque at the carotid bifurcation. No evidence of significant stenosis or dissection. Tortuous proximal common carotid artery. Vertebral arteries: Patent and codominant without evidence of significant stenosis or dissection. Mildly limited assessment of the left V1 segment due to venous contrast. Skeleton: Moderate  to severe multilevel cervical disc and facet degeneration. Other neck: No evidence of cervical lymphadenopathy or mass. Upper chest: Small bilateral pleural effusions. Mild dependent atelectasis in both lungs. Review of the MIP images confirms the above findings CTA HEAD FINDINGS Anterior circulation: The internal carotid arteries are widely patent from skull base to carotid termini. ACAs and MCAs are patent without evidence of proximal branch occlusion or significant proximal stenosis. The right A1 segment is absent. No aneurysm is identified. Posterior circulation: The intracranial vertebral arteries are patent to the basilar. Patent bilateral PICA, left AICA, and bilateral SCA origins are identified. The basilar artery is widely patent. There are fetal origins of both PCAs. PCA branch vessel irregularity is noted bilaterally without flow limiting proximal stenosis. No aneurysm is identified. Venous sinuses: Patent. Anatomic variants: Absent right A1. Fetal origin of the PCAs. Review of the MIP images confirms the above findings IMPRESSION: 1. No major intracranial arterial occlusion or significant proximal stenosis. 2. Cervical carotid artery atherosclerosis without significant stenosis. 3. Small bilateral pleural effusions. Electronically Signed  By: Logan Bores M.D.   On: 05/20/2019 13:17   Ct Head Wo Contrast  Result Date: 05/20/2019 CLINICAL DATA:  Per patient "he has had left arm weakness for 2 days"Focal neuro deficit, > 6 hrs, stroke suspected Possible stroke EXAM: CT HEAD WITHOUT CONTRAST TECHNIQUE: Contiguous axial images were obtained from the base of the skull through the vertex without intravenous contrast. COMPARISON:  None. FINDINGS: Brain: No acute intracranial hemorrhage. No focal mass lesion. No CT evidence of acute infarction. No midline shift or mass effect. No hydrocephalus. Basilar cisterns are patent. There are periventricular and subcortical white matter hypodensities. Generalized  cortical atrophy. Vascular: No hyperdense vessel or unexpected calcification. Skull: Normal. Negative for fracture or focal lesion. Sinuses/Orbits: Paranasal sinuses and mastoid air cells are clear. Orbits are clear. Other: None. IMPRESSION: 1. No acute intracranial findings. 2. Atrophy and white matter microvascular disease. Electronically Signed   By: Suzy Bouchard M.D.   On: 05/20/2019 07:46   Ct Angio Neck W Or Wo Contrast  Result Date: 05/20/2019 CLINICAL DATA:  Stroke follow-up. Acute posterior right frontal lobe infarct on MRI. Left arm weakness. EXAM: CT ANGIOGRAPHY HEAD AND NECK TECHNIQUE: Multidetector CT imaging of the head and neck was performed using the standard protocol during bolus administration of intravenous contrast. Multiplanar CT image reconstructions and MIPs were obtained to evaluate the vascular anatomy. Carotid stenosis measurements (when applicable) are obtained utilizing NASCET criteria, using the distal internal carotid diameter as the denominator. CONTRAST:  41mL OMNIPAQUE IOHEXOL 350 MG/ML SOLN COMPARISON:  None. FINDINGS: CTA NECK FINDINGS Aortic arch: Normal variant aortic arch branching pattern with common origin of the brachiocephalic and left common carotid arteries. Mild arch atherosclerosis without arch vessel origin stenosis. Right carotid system: Patent with mild calcified plaque at the carotid bifurcation. No evidence of significant stenosis or dissection. Tortuous proximal common carotid artery. Left carotid system: Patent with mild-to-moderate calcified and soft plaque at the carotid bifurcation. No evidence of significant stenosis or dissection. Tortuous proximal common carotid artery. Vertebral arteries: Patent and codominant without evidence of significant stenosis or dissection. Mildly limited assessment of the left V1 segment due to venous contrast. Skeleton: Moderate to severe multilevel cervical disc and facet degeneration. Other neck: No evidence of cervical  lymphadenopathy or mass. Upper chest: Small bilateral pleural effusions. Mild dependent atelectasis in both lungs. Review of the MIP images confirms the above findings CTA HEAD FINDINGS Anterior circulation: The internal carotid arteries are widely patent from skull base to carotid termini. ACAs and MCAs are patent without evidence of proximal branch occlusion or significant proximal stenosis. The right A1 segment is absent. No aneurysm is identified. Posterior circulation: The intracranial vertebral arteries are patent to the basilar. Patent bilateral PICA, left AICA, and bilateral SCA origins are identified. The basilar artery is widely patent. There are fetal origins of both PCAs. PCA branch vessel irregularity is noted bilaterally without flow limiting proximal stenosis. No aneurysm is identified. Venous sinuses: Patent. Anatomic variants: Absent right A1. Fetal origin of the PCAs. Review of the MIP images confirms the above findings IMPRESSION: 1. No major intracranial arterial occlusion or significant proximal stenosis. 2. Cervical carotid artery atherosclerosis without significant stenosis. 3. Small bilateral pleural effusions. Electronically Signed   By: Logan Bores M.D.   On: 05/20/2019 13:17   Mr Brain Wo Contrast  Result Date: 05/20/2019 CLINICAL DATA:  Left arm weakness. EXAM: MRI HEAD WITHOUT CONTRAST TECHNIQUE: Multiplanar, multiecho pulse sequences of the brain and surrounding structures were obtained without intravenous  contrast. COMPARISON:  Head CT 05/20/2019 and MRI 02/23/2014 FINDINGS: The patient was unable to tolerate the examination which was terminated prematurely. Axial and coronal diffusion sequences and an axial FLAIR sequence were obtained and are severely motion degraded. There is a small region of diffusion signal abnormality involving cortex and white matter in the posterior right frontal lobe consistent with an acute infarct. No gross intracranial hemorrhage, intracranial mass  effect, or extra-axial fluid collection is identified. Background moderate chronic small vessel ischemic changes are not well evaluated. There is mild-to-moderate cerebral atrophy. IMPRESSION: 1. Severely motion degraded, incomplete examination. 2. Small acute posterior right frontal lobe infarct. Electronically Signed   By: Logan Bores M.D.   On: 05/20/2019 10:11        Scheduled Meds:   stroke: mapping our early stages of recovery book   Does not apply Once   allopurinol  100 mg Oral Daily   aspirin  81 mg Oral Daily   atorvastatin  80 mg Oral q1800   carvedilol  6.25 mg Oral BID WC   clopidogrel  75 mg Oral Daily   docusate sodium  100 mg Oral BID   dorzolamide  1 drop Both Eyes TID   enoxaparin (LOVENOX) injection  40 mg Subcutaneous Q24H   ferrous sulfate  325 mg Oral BID WC   latanoprost  1 drop Both Eyes QHS   Continuous Infusions:  sodium chloride 50 mL/hr at 05/20/19 1626     LOS: 1 day    Time spent: 35 minutes    Edwin Dada, MD [facility] 05/21/2019, 3:37 PM     Please page through [place]:  [email or web address] Password [email or web address] If 7PM-7AM, please contact night-coverage

## 2019-05-21 NOTE — Progress Notes (Signed)
CSW acknowledges consult for needing placement. PT/OT/Speech have been consulted to complete evaluations and make recommendations.   CSW will continue to follow and assist with disposition planning based off therapy recommendations.   Domenic Schwab, MSW, [place] Worker [facility]  [phone]

## 2019-05-21 NOTE — Progress Notes (Signed)
STROKE TEAM PROGRESS NOTE   HISTORY OF PRESENT ILLNESS (per record) Trevor Murphy is a 84 y.o. 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  Patient lives on his own, although family visits often and he has a caregiver, noted to be much weaker on his L side today and wasn't able to complete his routine ADLs. On eval today, patient reports L hand numbness, weakness of L leg, and sister at bedside also reports slurred speech, and L facial droop in addition to his above sxs. Of note, patient has previous hx of L frontal lobe infarct in 2013 in addition to remote infarcts of bilateral cerebellum, R paracentral pontine region, L basal ganglia, L thalamus, and bilateral central semiovale found at that time.  Labs today show elevated sCr 1.99 (stable from Cr 6 months ago), otherwise grossly unremarkable. Work up that has been done: MRI brain- small acute posterior R frontal lobe infarct, exam limited d/t movement artifacts Stroke risk factors: Essential (primary) hypertension, Hypertension Emergency (SBP > 180 or DBP > 120 & end organ damage), Chronic systolic (congestive) heart failure and CKD Stage 3 (GFR 30-59)  LKW: 2 days ago tpa given?: no, outside window   INTERVAL HISTORY His daughter is at the bedside.  . She has persistent slurred speech left facial weakness and mild left sided weakness.  MRI scan is motion degraded but shows right frontal cortical and subcortical infarct.  CT angiogram of brain and neck showed no significant large vessel extracranial intracranial stenosis.  2D echo is pending.  LDL cholesterol 71 mg percent.    OBJECTIVE Vitals:   05/20/19 2331 05/21/19 0411 05/21/19 0829 05/21/19 1211  BP: (!) 161/80 (!) 163/66 (!) 208/77 (!) 167/86  Pulse: 74 66 (!) 52 60  Resp: 17 18 18 17   Temp: (!) 97.4 F (36.3 C) 97.7 F (36.5 C) 97.8 F (36.6 C)  97.6 F (36.4 C)  TempSrc: Oral Oral Oral Oral  SpO2: 100% 99% 100% 99%  Weight:      Height:        CBC:  Recent Labs  Lab 05/20/19 0630  WBC 4.5  NEUTROABS 2.6  HGB 10.7*  HCT 36.7*  MCV 72.2*  PLT 433    Basic Metabolic Panel:  Recent Labs  Lab 05/20/19 0630  NA 137  K 5.1  CL 108  CO2 19*  GLUCOSE 99  BUN 19  CREATININE 1.99*  CALCIUM 8.4*    Lipid Panel:     Component Value Date/Time   CHOL 108 05/21/2019 0422   CHOL 132 11/21/2018 1103   TRIG 44 05/21/2019 0422   HDL 28 (L) 05/21/2019 0422   HDL 36 (L) 11/21/2018 1103   CHOLHDL 3.9 05/21/2019 0422   VLDL 9 05/21/2019 0422   LDLCALC 71 05/21/2019 0422   LDLCALC 79 11/21/2018 1103   HgbA1c:  Lab Results  Component Value Date   HGBA1C 5.6 11/21/2018   Urine Drug Screen:     Component Value Date/Time   LABOPIA NONE DETECTED 02/23/2014 0910   COCAINSCRNUR NONE DETECTED 02/23/2014 0910   LABBENZ NONE DETECTED 02/23/2014 0910   AMPHETMU NONE DETECTED 02/23/2014 0910   THCU NONE DETECTED 02/23/2014 0910   LABBARB NONE DETECTED 02/23/2014 0910    Alcohol Level     Component Value Date/Time   ETH <11  07/07/2014 2252    IMAGING  Ct Angio Head W Or Wo Contrast Ct Angio Neck W Or Wo Contrast 05/20/2019 IMPRESSION:  1. No major intracranial arterial occlusion or significant proximal stenosis.  2. Cervical carotid artery atherosclerosis without significant stenosis.  3. Small bilateral pleural effusions.  Ct Head Wo Contrast 05/20/2019 IMPRESSION:  1. No acute intracranial findings.  2. Atrophy and white matter microvascular disease.   Mr Brain Wo Contrast 05/20/2019 IMPRESSION:  1. Severely motion degraded, incomplete examination.  2. Small acute posterior right frontal lobe infarct.     Transthoracic Echocardiogram  05/20/2019 Impression  1. Left ventricular ejection fraction, by visual estimation, is 55 to 60%. The left ventricle has normal function. Normal left ventricular size.  There is severely increased left ventricular hypertrophy.  2. Left ventricular diastolic Doppler parameters are consistent with impaired relaxation pattern of LV diastolic filling.  3. Global right ventricle has normal systolic function.The right ventricular size is normal. No increase in right ventricular wall thickness.  4. Left atrial size was normal.  5. Right atrial size was normal.  6. The mitral valve is normal in structure. Mild mitral valve regurgitation. No evidence of mitral stenosis.  7. The tricuspid valve is normal in structure. Tricuspid valve regurgitation was not visualized by color flow Doppler.  8. The aortic valve is normal in structure. Aortic valve regurgitation is mild by color flow Doppler. Mild to moderate aortic valve sclerosis/calcification without any evidence of aortic stenosis.  9. The pulmonic valve was normal in structure. Pulmonic valve regurgitation is not visualized by color flow Doppler. 10. The inferior vena cava is normal in size with greater than 50% respiratory variability, suggesting right atrial pressure of 3 mmHg.   ECG - SB rate 57 BPM. (See cardiology reading for complete details)    PHYSICAL EXAM Blood pressure (!) 167/86, pulse 60, temperature 97.6 F (36.4 C), temperature source Oral, resp. rate 17, height 5\' 7"  (1.702 m), weight 74.8 kg, SpO2 99 %. Frail elderly African-American lady lying comfortably in bed.  Not in distress. . Afebrile. Head is nontraumatic. Neck is supple without bruit.    Cardiac exam no murmur or gallop. Lungs are clear to auscultation. Distal pulses are well felt. Neurological Exam :  She is awake alert she is oriented to time and place.  She has mild dysarthria but can be understood.  Extraocular movements are full range without nystagmus.  Blinks to threat bilaterally.  Left lower facial weakness.  Tongue midline.  Motor system exam reveals mild left upper and lower extremity drift with weakness of left grip and intrinsic  hand muscles.  Orbits right over left upper extremity.  Sensation appears preserved bilaterally.  Coordination is slow but accurate.  Deep tendon reflexes symmetric.  Plantars are downgoing.  Gait not tested.   ASSESSMENT/PLAN Trevor Murphy is a 84 y.o. 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 He did not receive IV t-PA due to late presentation (>4.5 hours from time of onset)  Stroke: acute posterior right frontal lobe infarct - embolic - source unknown  Resultant dysarthria and left hemiparesis  Code Stroke CT Head - not ordered  CT head -No acute intracranial findings. Atrophy and white matter microvascular disease.  MRI head - Severely motion degraded, incomplete examination. Small acute posterior right frontal lobe infarct.   MRA head - not ordered  CTA H&N - no major intracranial arterial occlusion or significant proximal stenosis. Cervical carotid artery atherosclerosis without significant stenosis. Small bilateral pleural effusions.  CT Perfusion - not ordered  Carotid Doppler - CTA neck performed - carotid dopplers not indicated.  2D Echo - EF 55 to 60%. No cardiac source of emboli identified.   [employer] Virus 2 - negative  LDL - 71  HgbA1c - not ordered  UDS - not ordered  VTE prophylaxis - Lovenox Diet  Diet Order            Diet Heart Fluid consistency: Thin  Diet effective ____              aspirin 81 mg daily prior to admission, now on aspirin 81 mg  And Plavix 75 mg daily x 3 weeks and then Plavix alone  Patient counseled to be compliant with his antithrombotic medications  Ongoing aggressive stroke risk factor management  Therapy recommendations:  pending  Disposition:  Pending  Hypertension  Home BP meds: Coreg  ; Catapres ; Hydralazine  Current BP meds: Coreg  Blood pressure somewhat high at times but within post stroke/TIA parameters . Permissive hypertension (OK if < 220/120) but gradually normalize in 5-7 days   . Long-term BP goal normotensive  Hyperlipidemia  Home Lipid lowering medication:  Zocor 20 mg daily  LDL 71, goal < 70  Current lipid lowering medication: Lipitor 40 mg daily   Continue statin at discharge  Diabetes  Home diabetic meds:  Current diabetic meds:  HgbA1c 5.6, goal < 7.0 No results for input(s): GLUCAP in the last 72 hours.  Other Stroke Risk Factors  Advanced age  Hx stroke/TIA  Family hx stroke (sister)  Coronary artery disease  Congestive Heart Failure  Other Active Problems  CKD - creatinine -1.99  Anemia - Hb - 10.7  Hospital day # 1  I have personally obtained history,examined this patient, reviewed notes, independently viewed imaging studies, participated in medical decision making and plan of care.ROS completed by me personally and pertinent positives fully documented  I have made any additions or clarifications directly to the above note.  She presented with dysarthria and left hemiparesis secondary to right frontal cortical and subcortical infarct likely of embolic etiology.Continue ongoing stroke work-up and check echocardiogram if unyielding may need TEE and loop recorder tomorrow.  Continue aspirin and Plavix x 3 weeks and then Plavix alone  for stroke prevention.  Long discussion with the patient and daughter and answered questions.  Discussed with Dr. Loleta Books.  Greater than 50% time during this 35-minute visit was spent on counseling and coordination of care about her embolic stroke and discussion about evaluation, treatment and prevention and answering questions.  Antony Contras, MD Medical Director [facility] Pager: [phone] 05/21/2019 1:58 PM   To contact Stroke Continuity provider, please refer to  [email or web address] After hours, contact General Neurology

## 2019-05-22 LAB — IRON AND TIBC
Iron: 44 ug/dL — ABNORMAL LOW (ref 45–182)
Saturation Ratios: 15 % — ABNORMAL LOW (ref 17.9–39.5)
TIBC: 293 ug/dL (ref 250–450)
UIBC: 249 ug/dL

## 2019-05-22 LAB — GLUCOSE, CAPILLARY: Glucose-Capillary: 131 mg/dL — ABNORMAL HIGH (ref 70–99)

## 2019-05-22 LAB — FERRITIN: Ferritin: 93 ng/mL (ref 24–336)

## 2019-05-22 MED ORDER — ATORVASTATIN CALCIUM 80 MG PO TABS: 80.0000 mg | ORAL_TABLET | Freq: Every day | ORAL | 3 refills | Status: DC

## 2019-05-22 MED ORDER — FERROUS SULFATE 325 (65 FE) MG PO TABS: 325.0000 mg | ORAL_TABLET | Freq: Two times a day (BID) | ORAL | 3 refills | Status: DC

## 2019-05-22 MED ORDER — CLOPIDOGREL BISULFATE 75 MG PO TABS: 75.0000 mg | ORAL_TABLET | Freq: Every day | ORAL | 2 refills | Status: DC

## 2019-05-22 MED ORDER — ENSURE ENLIVE PO LIQD
237.0000 mL | Freq: Two times a day (BID) | ORAL | Status: DC
  Administered 2019-05-22: 237 mL via ORAL

## 2019-05-22 MED ORDER — DOCUSATE SODIUM 100 MG PO CAPS: 100.0000 mg | ORAL_CAPSULE | Freq: Two times a day (BID) | ORAL | 0 refills | Status: DC

## 2019-05-22 MED ORDER — CLONIDINE HCL 0.1 MG PO TABS
0.2000 mg | ORAL_TABLET | Freq: Two times a day (BID) | ORAL | Status: DC
  Administered 2019-05-22: 0.2 mg via ORAL
  Filled 2019-05-22: qty 2

## 2019-05-22 MED ORDER — HYDRALAZINE HCL 50 MG PO TABS
100.0000 mg | ORAL_TABLET | Freq: Three times a day (TID) | ORAL | Status: DC
  Administered 2019-05-22: 100 mg via ORAL
  Filled 2019-05-22: qty 2

## 2019-05-22 NOTE — Progress Notes (Signed)
Inpatient Rehabilitation Admissions Coordinator  Inpatient rehab consult received. PT and OT recommending [facility] with their evals this weekend.. Therapy unable to work with him today due to his elevated BP. I will follow up once therapy able to further treat patient.  Danne Baxter, RN, MSN Rehab Admissions Coordinator [phone] 05/22/2019 1:14 PM

## 2019-05-22 NOTE — Progress Notes (Signed)
Initial Nutrition Assessment  DOCUMENTATION CODES:   Not applicable  INTERVENTION:  Provide Ensure Enlive po BID, each supplement provides 350 kcal and 20 grams of protein  Encourage adequate PO intake.   NUTRITION DIAGNOSIS:   Increased nutrient needs related to chronic illness(CHF) as evidenced by estimated needs.  GOAL:   Patient will meet greater than or equal to 90% of their needs  MONITOR:   PO intake, Supplement acceptance, Skin, Weight trends, Labs, I & O's  REASON FOR ASSESSMENT:   Consult Assessment of nutrition requirement/status  ASSESSMENT:   84 y.o. M with dCHF, HTN, gout, CKD III baseline Cr 1.5-1.9, prior CVA without residual deficits and parox SVT who presented with 2 days left sided weakness. MRI showed acute right frontal stroke. Pt with acute right MCA territory infarct  Meal completion 50%. Pt reports appetite is fine currently and PTA with usual consumption of at least 3 meals a day. Pt with no significant weight loss. RD to order nutritional supplements to aid in caloric and protein needs. Pt encouraged to eat his food at meals and to drink his supplements.   NUTRITION - FOCUSED PHYSICAL EXAM:  Partial physical exam performed. Pt requesting rest during time of visit.     Most Recent Value  Orbital Region  Unable to assess  Upper Arm Region  Unable to assess  Thoracic and Lumbar Region  Unable to assess  Buccal Region  Unable to assess  Temple Region  Unable to assess  Clavicle Bone Region  Unable to assess  Clavicle and Acromion Bone Region  Unable to assess  Scapular Bone Region  Unable to assess  Dorsal Hand  Unable to assess  Patellar Region  Moderate depletion  Anterior Thigh Region  Moderate depletion  Posterior Calf Region  Moderate depletion  Edema (RD Assessment)  None  Hair  Reviewed  Eyes  Reviewed  Mouth  Reviewed  Skin  Reviewed  Nails  Unable to assess     Labs and medications reviewed.  Diet Order:   Diet Order        Diet NPO time specified  Diet effective midnight        Diet Heart Room service appropriate? Yes; Fluid consistency: Thin  Diet effective now              EDUCATION NEEDS:   Not appropriate for education at this time  Skin:  Skin Assessment: Reviewed RN Assessment  Last BM:  10/11  Height:   Ht Readings from Last 1 Encounters:  05/20/19 5\' 7"  (1.702 m)    Weight:   Wt Readings from Last 1 Encounters:  05/20/19 74.8 kg    Ideal Body Weight:  67.27 kg  BMI:  Body mass index is 25.84 kg/m.  Estimated Nutritional Needs:   Kcal:  1850-2000  Protein:  85-95 grams  Fluid:  >/= 1.8 L/day    Corrin Parker, MS, RD, LDN Pager # [phone] After hours/ weekend pager # [phone]

## 2019-05-22 NOTE — Discharge Summary (Addendum)
Physician Discharge Summary  DENNARD VEZINA UJW:[id number] DOB: October 07, 1934 DOA: 05/20/2019  PCP: Glendale Chard, MD  Admit date: 05/20/2019 Discharge date: 05/22/2019  Admitted From: Home  Disposition:  Home with [facility]   Recommendations for Outpatient Follow-up:  1. Follow up with Dr. Baird Cancer in 1-2 weeks 2. Follow up with [facility] in 4 weeks 3. Dr. Baird Cancer: If not established with GI for iron deficiency anemia, please refer for evaluation  4. Dr. Baird Cancer: Please evaluate BP and titrate antihypertensives if needed 5. TEE and loop recorder to be done under conscious sedation on 10/14 at [facility]: Yes  Equipment/Devices: None  Discharge Condition: Good  CODE STATUS: FULL Diet recommendation: Cardiac  Brief/Interim Summary: Mr. Stejskal is a 84 y.o. M with dCHF, HTN, gout, CKD III baseline Cr 1.5-1.9, prior CVA without residual deficits and parox SVT who presented with 2 days left sided weakness.  In the ER, CT head unremarkable.   Admitted for MRI brain and likely stroke.     PRINCIPAL HOSPITAL DIAGNOSIS: Acute RIGHT frontal lobe infarct, cryptogenic    Discharge Diagnoses:   Acute RIGHT MCA territory infarct Presented with 2 days left hemiparesis.  MRI showed acute right frontal stroke.  Non-invasive angiography showed no significant associated atherosclerosis.  -Echocardiogram showed no cardiac source of emboli -Carotid imaging showed no significant stenosis -Lipids ordered: simva 20  --> atorva 80 -Aspirin ordered at admission --> discharged with plan for aspirin 81 PLUS Plavix for 3 weeks followed by Plavix alone indefinitely -Neurology follow up ordered -Atrial fibrillation: none noted on telemetry -tPA not given because outside stroke window -Dysphagia screen ordered in ER -PT eval ordered -Smoking cessation: former smoker, since quit  -TEE and loop recorder arranged for 2 days from now    Chronic diastolic CHF Hypertension Permissive  hypertension on admission.  Hydralazine, Coreg and clonidine restarted by time of discharge, and BP normalized.  CKD III  Baseline Cr appears to be between 1.5 and 2.   Stable relative to baseline.  Gout No active disease  Iron deficiency anemia Previously iron deficient.  Ferritin improved from previous.             Discharge Instructions  Discharge Instructions    Diet - low sodium heart healthy   Complete by: As directed    Discharge instructions   Complete by: As directed    From Dr. Loleta Books: You were admitted for stroke.  To reduce the chance of another stroke:  Continue aspirin 81 mg daily for three more weeks then stop Start new clopidogrel/Plavix 75 mg daily  Plavix is a "super-aspirin" that should replace aspirin for you  Take atorvastatin 80 mg nightly (this is a cholesterol medicine)  Resume your home blood pressure medicines tonight when you get home and have the home health nurse check your blood pressure.  [facility] will contact you for a follow up appointment in 4 weeks  Call your primary care doctor for a follow up appointment in 1-2 weeks    Come back on Wednesday for your "TEE" and loop recorder insertion Do not eat or drink anything after midnight on Tuesday before the procedure QUARANTINE STRICTLY between now and the procedure (have family in the home wear a mask around you at all times, have any aides or outside therapists that come in on Tuesday wear a mask) Arrive on Wednesday morning to the main [employer] entrance to [place] to the admissions desk for your TEE at 9:30  AM   Increase activity slowly   Complete by: As directed      Allergies as of 05/22/2019   No Known Allergies     Medication List    STOP taking these medications   simvastatin 20 MG tablet Commonly known as: ZOCOR     TAKE these medications   acetaminophen 500 MG tablet Commonly known as: TYLENOL Take 1,000 mg by mouth every 6 (six) hours as needed for  headache.   allopurinol 100 MG tablet Commonly known as: ZYLOPRIM TAKE ONE TABLET BY MOUTH ONCE DAILY TO PREVENT GOUT What changed:   how much to take  how to take this  when to take this   aspirin EC 81 MG tablet Take 1 tablet (81 mg total) by mouth daily.   atorvastatin 80 MG tablet Commonly known as: LIPITOR Take 1 tablet (80 mg total) by mouth daily at 6 PM.   carvedilol 6.25 MG tablet Commonly known as: COREG TAKE 1 TABLET (6.25MG ) BY MOUTH TWO TIMES EVERY DAY WITH FOOD What changed:   how much to take  how to take this  when to take this   cloNIDine 0.2 MG tablet Commonly known as: CATAPRES Take 1 tablet (0.2 mg total) by mouth daily.   clopidogrel 75 MG tablet Commonly known as: PLAVIX Take 1 tablet (75 mg total) by mouth daily. Start taking on: May 23, 2019   docusate sodium 100 MG capsule Commonly known as: COLACE Take 1 capsule (100 mg total) by mouth 2 (two) times daily.   dorzolamide 2 % ophthalmic solution Commonly known as: TRUSOPT Place 1 drop into both eyes 3 (three) times daily.   ferrous sulfate 325 (65 FE) MG tablet Take 1 tablet (325 mg total) by mouth 2 (two) times daily with a meal.   hydrALAZINE 100 MG tablet Commonly known as: APRESOLINE TAKE 1 TABLET (100 MG TOTAL) BY MOUTH THREE TIMES DAILY. **NEEDS TO CALL FOR OCTOBER APPOINTMENT** What changed: See the new instructions.   Lumigan 0.01 % Soln Generic drug: bimatoprost   nitroGLYCERIN 0.4 MG SL tablet Commonly known as: NITROSTAT Place 0.4 mg under the tongue every 5 (five) minutes as needed for chest pain. X 3 doses   potassium chloride 10 MEQ tablet Commonly known as: KLOR-CON Take 10 mEq by mouth every other day.   Travoprost (BAK Free) 0.004 % Soln ophthalmic solution Commonly known as: TRAVATAN Place 1 drop into both eyes at bedtime.       No Known Allergies  Consultations:  Neurology   Procedures/Studies: Ct Angio Head W Or Wo Contrast  Result  Date: 05/20/2019 CLINICAL DATA:  Stroke follow-up. Acute posterior right frontal lobe infarct on MRI. Left arm weakness. EXAM: CT ANGIOGRAPHY HEAD AND NECK TECHNIQUE: Multidetector CT imaging of the head and neck was performed using the standard protocol during bolus administration of intravenous contrast. Multiplanar CT image reconstructions and MIPs were obtained to evaluate the vascular anatomy. Carotid stenosis measurements (when applicable) are obtained utilizing NASCET criteria, using the distal internal carotid diameter as the denominator. CONTRAST:  17mL OMNIPAQUE IOHEXOL 350 MG/ML SOLN COMPARISON:  None. FINDINGS: CTA NECK FINDINGS Aortic arch: Normal variant aortic arch branching pattern with common origin of the brachiocephalic and left common carotid arteries. Mild arch atherosclerosis without arch vessel origin stenosis. Right carotid system: Patent with mild calcified plaque at the carotid bifurcation. No evidence of significant stenosis or dissection. Tortuous proximal common carotid artery. Left carotid system: Patent with mild-to-moderate calcified and soft plaque at  the carotid bifurcation. No evidence of significant stenosis or dissection. Tortuous proximal common carotid artery. Vertebral arteries: Patent and codominant without evidence of significant stenosis or dissection. Mildly limited assessment of the left V1 segment due to venous contrast. Skeleton: Moderate to severe multilevel cervical disc and facet degeneration. Other neck: No evidence of cervical lymphadenopathy or mass. Upper chest: Small bilateral pleural effusions. Mild dependent atelectasis in both lungs. Review of the MIP images confirms the above findings CTA HEAD FINDINGS Anterior circulation: The internal carotid arteries are widely patent from skull base to carotid termini. ACAs and MCAs are patent without evidence of proximal branch occlusion or significant proximal stenosis. The right A1 segment is absent. No aneurysm is  identified. Posterior circulation: The intracranial vertebral arteries are patent to the basilar. Patent bilateral PICA, left AICA, and bilateral SCA origins are identified. The basilar artery is widely patent. There are fetal origins of both PCAs. PCA branch vessel irregularity is noted bilaterally without flow limiting proximal stenosis. No aneurysm is identified. Venous sinuses: Patent. Anatomic variants: Absent right A1. Fetal origin of the PCAs. Review of the MIP images confirms the above findings IMPRESSION: 1. No major intracranial arterial occlusion or significant proximal stenosis. 2. Cervical carotid artery atherosclerosis without significant stenosis. 3. Small bilateral pleural effusions. Electronically Signed   By: Logan Bores M.D.   On: 05/20/2019 13:17   Ct Head Wo Contrast  Result Date: 05/20/2019 CLINICAL DATA:  Per patient "he has had left arm weakness for 2 days"Focal neuro deficit, > 6 hrs, stroke suspected Possible stroke EXAM: CT HEAD WITHOUT CONTRAST TECHNIQUE: Contiguous axial images were obtained from the base of the skull through the vertex without intravenous contrast. COMPARISON:  None. FINDINGS: Brain: No acute intracranial hemorrhage. No focal mass lesion. No CT evidence of acute infarction. No midline shift or mass effect. No hydrocephalus. Basilar cisterns are patent. There are periventricular and subcortical white matter hypodensities. Generalized cortical atrophy. Vascular: No hyperdense vessel or unexpected calcification. Skull: Normal. Negative for fracture or focal lesion. Sinuses/Orbits: Paranasal sinuses and mastoid air cells are clear. Orbits are clear. Other: None. IMPRESSION: 1. No acute intracranial findings. 2. Atrophy and white matter microvascular disease. Electronically Signed   By: Suzy Bouchard M.D.   On: 05/20/2019 07:46   Ct Angio Neck W Or Wo Contrast  Result Date: 05/20/2019 CLINICAL DATA:  Stroke follow-up. Acute posterior right frontal lobe infarct  on MRI. Left arm weakness. EXAM: CT ANGIOGRAPHY HEAD AND NECK TECHNIQUE: Multidetector CT imaging of the head and neck was performed using the standard protocol during bolus administration of intravenous contrast. Multiplanar CT image reconstructions and MIPs were obtained to evaluate the vascular anatomy. Carotid stenosis measurements (when applicable) are obtained utilizing NASCET criteria, using the distal internal carotid diameter as the denominator. CONTRAST:  27mL OMNIPAQUE IOHEXOL 350 MG/ML SOLN COMPARISON:  None. FINDINGS: CTA NECK FINDINGS Aortic arch: Normal variant aortic arch branching pattern with common origin of the brachiocephalic and left common carotid arteries. Mild arch atherosclerosis without arch vessel origin stenosis. Right carotid system: Patent with mild calcified plaque at the carotid bifurcation. No evidence of significant stenosis or dissection. Tortuous proximal common carotid artery. Left carotid system: Patent with mild-to-moderate calcified and soft plaque at the carotid bifurcation. No evidence of significant stenosis or dissection. Tortuous proximal common carotid artery. Vertebral arteries: Patent and codominant without evidence of significant stenosis or dissection. Mildly limited assessment of the left V1 segment due to venous contrast. Skeleton: Moderate to severe multilevel cervical disc  and facet degeneration. Other neck: No evidence of cervical lymphadenopathy or mass. Upper chest: Small bilateral pleural effusions. Mild dependent atelectasis in both lungs. Review of the MIP images confirms the above findings CTA HEAD FINDINGS Anterior circulation: The internal carotid arteries are widely patent from skull base to carotid termini. ACAs and MCAs are patent without evidence of proximal branch occlusion or significant proximal stenosis. The right A1 segment is absent. No aneurysm is identified. Posterior circulation: The intracranial vertebral arteries are patent to the  basilar. Patent bilateral PICA, left AICA, and bilateral SCA origins are identified. The basilar artery is widely patent. There are fetal origins of both PCAs. PCA branch vessel irregularity is noted bilaterally without flow limiting proximal stenosis. No aneurysm is identified. Venous sinuses: Patent. Anatomic variants: Absent right A1. Fetal origin of the PCAs. Review of the MIP images confirms the above findings IMPRESSION: 1. No major intracranial arterial occlusion or significant proximal stenosis. 2. Cervical carotid artery atherosclerosis without significant stenosis. 3. Small bilateral pleural effusions. Electronically Signed   By: Logan Bores M.D.   On: 05/20/2019 13:17   Mr Brain Wo Contrast  Result Date: 05/20/2019 CLINICAL DATA:  Left arm weakness. EXAM: MRI HEAD WITHOUT CONTRAST TECHNIQUE: Multiplanar, multiecho pulse sequences of the brain and surrounding structures were obtained without intravenous contrast. COMPARISON:  Head CT 05/20/2019 and MRI 02/23/2014 FINDINGS: The patient was unable to tolerate the examination which was terminated prematurely. Axial and coronal diffusion sequences and an axial FLAIR sequence were obtained and are severely motion degraded. There is a small region of diffusion signal abnormality involving cortex and white matter in the posterior right frontal lobe consistent with an acute infarct. No gross intracranial hemorrhage, intracranial mass effect, or extra-axial fluid collection is identified. Background moderate chronic small vessel ischemic changes are not well evaluated. There is mild-to-moderate cerebral atrophy. IMPRESSION: 1. Severely motion degraded, incomplete examination. 2. Small acute posterior right frontal lobe infarct. Electronically Signed   By: Logan Bores M.D.   On: 05/20/2019 10:11      Subjective: Feels well.  Vision loss is chronic.  He has no new fever, headache, cough, sputum, dyspnea or chest pain  Discharge Exam: Vitals:    05/22/19 1259 05/22/19 1607  BP: (!) 190/81 133/62  Pulse: 63 90  Resp: 18 18  Temp: 98.4 F (36.9 C) 98 F (36.7 C)  SpO2: 99% 99%   Vitals:   05/22/19 0802 05/22/19 1146 05/22/19 1259 05/22/19 1607  BP: (!) 187/72 (!) 209/85 (!) 190/81 133/62  Pulse: (!) 51 67 63 90  Resp: 17 17 18 18   Temp: 98.5 F (36.9 C) 97.6 F (36.4 C) 98.4 F (36.9 C) 98 F (36.7 C)  TempSrc: Oral Oral Oral Oral  SpO2: 100% 100% 99% 99%  Weight:      Height:        General: Pt is alert, awake, not in acute distress Cardiovascular: RRR, nl S1-S2, no murmurs appreciated.   No LE edema.   MSK: Swelling in right arm persists. Respiratory: Normal respiratory rate and rhythm.  CTAB without rales or wheezes. Abdominal: Abdomen soft and non-tender.  No distension or HSM.   Neuro/Psych: Strength with weakness in left upper extremity Judgment and insight appear slightly impaired due to chronic dementia, chronic vision loss in left eye.   The results of significant diagnostics from this hospitalization (including imaging, microbiology, ancillary and laboratory) are listed below for reference.     Microbiology: Recent Results (from the past 240 hour(s))  SARS CORONAVIRUS 2 (TAT 6-24 HRS) Nasopharyngeal Nasopharyngeal Swab     Status: None   Collection Time: 05/20/19 10:23 AM   Specimen: Nasopharyngeal Swab  Result Value Ref Range Status   SARS Coronavirus 2 NEGATIVE NEGATIVE Final    Comment: (NOTE) SARS-CoV-2 target nucleic acids are NOT DETECTED. The SARS-CoV-2 RNA is generally detectable in upper and lower respiratory specimens during the acute phase of infection. Negative results do not preclude SARS-CoV-2 infection, do not rule out co-infections with other pathogens, and should not be used as the sole basis for treatment or other patient management decisions. Negative results must be combined with clinical observations, patient history, and epidemiological information. The expected result is  Negative. Fact Sheet for Patients: SugarRoll.be Fact Sheet for Healthcare Providers: https://www.woods-mathews.com/ This test is not yet approved or cleared by the [place] FDA and  has been authorized for detection and/or diagnosis of SARS-CoV-2 by FDA under an Emergency Use Authorization (EUA). This EUA will remain  in effect (meaning this test can be used) for the duration of the COVID-19 declaration under Section 56 4(b)(1) of the Act, 21 U.S.C. section 360bbb-3(b)(1), unless the authorization is terminated or revoked sooner. Performed at [facility], [place] [address] [place], [place] [address]      Labs: BNP (last 3 results) No results for input(s): BNP in the last 8760 hours. Basic Metabolic Panel: Recent Labs  Lab 05/20/19 0630  NA 137  K 5.1  CL 108  CO2 19*  GLUCOSE 99  BUN 19  CREATININE 1.99*  CALCIUM 8.4*   Liver Function Tests: Recent Labs  Lab 05/20/19 0630  AST 18  ALT 18  ALKPHOS 68  BILITOT 0.5  PROT 7.3  ALBUMIN 3.5   No results for input(s): LIPASE, AMYLASE in the last 168 hours. No results for input(s): AMMONIA in the last 168 hours. CBC: Recent Labs  Lab 05/20/19 0630  WBC 4.5  NEUTROABS 2.6  HGB 10.7*  HCT 36.7*  MCV 72.2*  PLT 189   Cardiac Enzymes: No results for input(s): CKTOTAL, CKMB, CKMBINDEX, TROPONINI in the last 168 hours. BNP: Invalid input(s): POCBNP CBG: Recent Labs  Lab 05/22/19 1629  GLUCAP 131*   D-Dimer No results for input(s): DDIMER in the last 72 hours. Hgb A1c No results for input(s): HGBA1C in the last 72 hours. Lipid Profile Recent Labs    05/21/19 0422  CHOL 108  HDL 28*  LDLCALC 71  TRIG 44  CHOLHDL 3.9   Thyroid function studies No results for input(s): TSH, T4TOTAL, T3FREE, THYROIDAB in the last 72 hours.  Invalid input(s): FREET3 Anemia work up Recent Labs    05/22/19 0511  FERRITIN 93  TIBC 293  IRON 44*   Urinalysis     Component Value Date/Time   COLORURINE STRAW (A) 05/20/2019 1040   APPEARANCEUR CLEAR 05/20/2019 1040   LABSPEC 1.004 (L) 05/20/2019 1040   PHURINE 6.0 05/20/2019 1040   GLUCOSEU NEGATIVE 05/20/2019 1040   HGBUR LARGE (A) 05/20/2019 1040   BILIRUBINUR NEGATIVE 05/20/2019 1040   KETONESUR NEGATIVE 05/20/2019 1040   PROTEINUR NEGATIVE 05/20/2019 1040   UROBILINOGEN 0.2 07/08/2014 0333   NITRITE NEGATIVE 05/20/2019 1040   LEUKOCYTESUR NEGATIVE 05/20/2019 1040   Sepsis Labs Invalid input(s): PROCALCITONIN,  WBC,  LACTICIDVEN Microbiology Recent Results (from the past 240 hour(s))  SARS CORONAVIRUS 2 (TAT 6-24 HRS) Nasopharyngeal Nasopharyngeal Swab     Status: None   Collection Time: 05/20/19 10:23 AM   Specimen: Nasopharyngeal Swab  Result Value Ref Range Status   SARS Coronavirus 2 NEGATIVE NEGATIVE Final    Comment: (NOTE) SARS-CoV-2 target nucleic acids are NOT DETECTED. The SARS-CoV-2 RNA is generally detectable in upper and lower respiratory specimens during the acute phase of infection. Negative results do not preclude SARS-CoV-2 infection, do not rule out co-infections with other pathogens, and should not be used as the sole basis for treatment or other patient management decisions. Negative results must be combined with clinical observations, patient history, and epidemiological information. The expected result is Negative. Fact Sheet for Patients: SugarRoll.be Fact Sheet for Healthcare Providers: https://www.woods-mathews.com/ This test is not yet approved or cleared by the [place] FDA and  has been authorized for detection and/or diagnosis of SARS-CoV-2 by FDA under an Emergency Use Authorization (EUA). This EUA will remain  in effect (meaning this test can be used) for the duration of the COVID-19 declaration under Section 56 4(b)(1) of the Act, 21 U.S.C. section 360bbb-3(b)(1), unless the authorization is terminated  or revoked sooner. Performed at [facility], [place] [address] [place], [place] [address]      Time coordinating discharge: 40 minutes      SIGNED:   Edwin Dada, MD  [facility] 05/22/2019, 4:53 PM

## 2019-05-22 NOTE — Progress Notes (Signed)
STROKE TEAM PROGRESS NOTE    INTERVAL HISTORY His sister is at the bedside.  [name] Kitchen He still has persistent mild left sided weakness.  2D echo was unremarkable.  TEE cannot be done until tomorrow due to full schedule   OBJECTIVE Vitals:   05/22/19 0354 05/22/19 0802 05/22/19 1146 05/22/19 1259  BP: (!) 182/69 (!) 187/72 (!) 209/85 (!) 190/81  Pulse: (!) 50 (!) 51 67 63  Resp: 17 17 17 18   Temp: 98.1 F (36.7 C) 98.5 F (36.9 C) 97.6 F (36.4 C) 98.4 F (36.9 C)  TempSrc: Oral Oral Oral Oral  SpO2: 95% 100% 100% 99%  Weight:      Height:        CBC:  Recent Labs  Lab 05/20/19 0630  WBC 4.5  NEUTROABS 2.6  HGB 10.7*  HCT 36.7*  MCV 72.2*  PLT 101    Basic Metabolic Panel:  Recent Labs  Lab 05/20/19 0630  NA 137  K 5.1  CL 108  CO2 19*  GLUCOSE 99  BUN 19  CREATININE 1.99*  CALCIUM 8.4*    Lipid Panel:     Component Value Date/Time   CHOL 108 05/21/2019 0422   CHOL 132 11/21/2018 1103   TRIG 44 05/21/2019 0422   HDL 28 (L) 05/21/2019 0422   HDL 36 (L) 11/21/2018 1103   CHOLHDL 3.9 05/21/2019 0422   VLDL 9 05/21/2019 0422   LDLCALC 71 05/21/2019 0422   LDLCALC 79 11/21/2018 1103   HgbA1c:  Lab Results  Component Value Date   HGBA1C 5.6 11/21/2018   Urine Drug Screen:     Component Value Date/Time   LABOPIA NONE DETECTED 02/23/2014 0910   COCAINSCRNUR NONE DETECTED 02/23/2014 0910   LABBENZ NONE DETECTED 02/23/2014 0910   AMPHETMU NONE DETECTED 02/23/2014 0910   THCU NONE DETECTED 02/23/2014 0910   LABBARB NONE DETECTED 02/23/2014 0910    Alcohol Level     Component Value Date/Time   ETH <11 07/07/2014 2252    IMAGING  Ct Angio Head W Or Wo Contrast Ct Angio Neck W Or Wo Contrast 05/20/2019 IMPRESSION:  1. No major intracranial arterial occlusion or significant proximal stenosis.  2. Cervical carotid artery atherosclerosis without significant stenosis.  3. Small bilateral pleural effusions.  Ct Head Wo  Contrast 05/20/2019 IMPRESSION:  1. No acute intracranial findings.  2. Atrophy and white matter microvascular disease.   Mr Brain Wo Contrast 05/20/2019 IMPRESSION:  1. Severely motion degraded, incomplete examination.  2. Small acute posterior right frontal lobe infarct.     Transthoracic Echocardiogram  05/20/2019 Impression  1. Left ventricular ejection fraction, by visual estimation, is 55 to 60%. The left ventricle has normal function. Normal left ventricular size. There is severely increased left ventricular hypertrophy.  2. Left ventricular diastolic Doppler parameters are consistent with impaired relaxation pattern of LV diastolic filling.  3. Global right ventricle has normal systolic function.The right ventricular size is normal. No increase in right ventricular wall thickness.  4. Left atrial size was normal.  5. Right atrial size was normal.  6. The mitral valve is normal in structure. Mild mitral valve regurgitation. No evidence of mitral stenosis.  7. The tricuspid valve is normal in structure. Tricuspid valve regurgitation was not visualized by color flow Doppler.  8. The aortic valve is normal in structure. Aortic valve regurgitation is mild by color flow Doppler. Mild to moderate aortic valve sclerosis/calcification without any evidence of aortic stenosis.  9. The pulmonic valve was normal  in structure. Pulmonic valve regurgitation is not visualized by color flow Doppler. 10. The inferior vena cava is normal in size with greater than 50% respiratory variability, suggesting right atrial pressure of 3 mmHg.   ECG - SB rate 57 BPM. (See cardiology reading for complete details)    PHYSICAL EXAM Blood pressure (!) 190/81, pulse 63, temperature 98.4 F (36.9 C), temperature source Oral, resp. rate 18, height 5\' 7"  (1.702 m), weight 74.8 kg, SpO2 99 %. Frail elderly African-American lady lying comfortably in bed.  Not in distress. . Afebrile. Head is nontraumatic. Neck  is supple without bruit.    Cardiac exam no murmur or gallop. Lungs are clear to auscultation. Distal pulses are well felt. Neurological Exam :  She is awake alert she is oriented to time and place.  She has mild dysarthria but can be understood.  Extraocular movements are full range without nystagmus.  Blinks to threat bilaterally.  Left lower facial weakness.  Tongue midline.  Motor system exam reveals mild left upper and lower extremity drift with weakness of left grip and intrinsic hand muscles.  Orbits right over left upper extremity.  Sensation appears preserved bilaterally.  Coordination is slow but accurate.  Deep tendon reflexes symmetric.  Plantars are downgoing.  Gait not tested.   ASSESSMENT/PLAN Trevor Murphy is a 84 y.o. 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 He did not receive IV t-PA due to late presentation (>4.5 hours from time of onset)  Stroke: acute posterior right frontal lobe infarct - embolic - source unknown  Resultant dysarthria and left hemiparesis  Code Stroke CT Head - not ordered  CT head -No acute intracranial findings. Atrophy and white matter microvascular disease.    MRI head - Severely motion degraded, incomplete examination. Small acute posterior right frontal lobe infarct.   MRA head - not ordered  CTA H&N - no major intracranial arterial occlusion or significant proximal stenosis. Cervical carotid artery atherosclerosis without significant stenosis. Small bilateral pleural effusions.  CT Perfusion - not ordered  Carotid Doppler - CTA neck performed - carotid dopplers not indicated.  2D Echo - EF 55 to 60%. No cardiac source of emboli identified.   [employer] Virus 2 - negative  LDL - 71  HgbA1c - not ordered  UDS -  not ordered  VTE prophylaxis - Lovenox Diet  Diet Order            Diet NPO time specified  Diet effective midnight        Diet Heart Room service appropriate? Yes; Fluid consistency: Thin  Diet effective now              aspirin 81 mg daily prior to admission, now on aspirin 81 mg  And Plavix 75 mg daily x 3 weeks and then Plavix alone  Patient counseled to be compliant with his antithrombotic medications  Ongoing aggressive stroke risk factor management  Therapy recommendations: CLR  Disposition:  Pending  Hypertension  Home BP meds: Coreg ; Catapres ; Hydralazine  Current BP meds: Coreg  Blood pressure somewhat high at times but within post stroke/TIA parameters . Permissive hypertension (OK if < 220/120) but gradually normalize in 5-7 days   . Long-term BP goal  normotensive  Hyperlipidemia  Home Lipid lowering medication:  Zocor 20 mg daily  LDL 71, goal < 70  Current lipid lowering medication: Lipitor 40 mg daily   Continue statin at discharge  Diabetes  Home diabetic meds:  Current diabetic meds:  HgbA1c 5.6, goal < 7.0 No results for input(s): GLUCAP in the last 72 hours.  Other Stroke Risk Factors  Advanced age  Hx stroke/TIA  Family hx stroke (sister)  Coronary artery disease  Congestive Heart Failure  Other Active Problems  CKD - creatinine -1.99  Anemia - Hb - 10.7  Hospital day # 2    He presented with dysarthria and left hemiparesis secondary to right frontal cortical and subcortical infarct likely of embolic etiology.Recommend TEE and loop recorder tomorrow.  Continue aspirin and Plavix x 3 weeks and then Plavix alone  for stroke prevention.  Long discussion with the patient and his sister at bedside and answered questions.  Discussed with Dr. Loleta Books.  And rehab coordinator greater than 50% time during this 25-minute visit was spent on counseling and coordination of care about her embolic stroke and discussion about evaluation,  treatment and prevention and answering questions.  Antony Contras, MD Medical Director [facility] Pager: [phone] 05/22/2019 1:49 PM   To contact Stroke Continuity provider, please refer to [email or web address] After hours, contact General Neurology

## 2019-05-22 NOTE — TOC Transition Note (Addendum)
Transition of Care [facility]) - CM/SW Discharge Note   Patient Details  Name: Trevor Murphy MRN: [id number] Date of Birth: 1934-10-01  Transition of Care [facility]) CM/SW Contact:  Pollie Friar, RN Phone Number: 05/22/2019, 4:46 PM   Clinical Narrative:    Pt discharging home with [facility] services through [place]. Tommi Rumps with [facility] accepted the referral.  Pts son states he has all needed DME at home. Pts son also states the family is able to provide 24 hour supervision at home. Son to provide transport home this pm.    Final next level of care: Home w Home Health Services Barriers to Discharge: No Barriers Identified   Patient Goals and CMS Choice   CMS Medicare.gov Compare Post Acute Care list provided to:: Patient Represenative (must comment) Choice offered to / list presented to : Sibling(son and sister)  Discharge Placement                       Discharge Plan and Services                          HH Arranged: RN, PT, OT, Nurse's Aide [place] Agency: [place] Date [facility] Agency Contacted: 05/22/19   Representative spoke with at [place]: [place] ([place]) Interventions     Readmission Risk Interventions No flowsheet data found.

## 2019-05-22 NOTE — H&P (View-Only) (Signed)
STROKE TEAM PROGRESS NOTE    INTERVAL HISTORY His sister is at the bedside.  [name] Kitchen He still has persistent mild left sided weakness.  2D echo was unremarkable.  TEE cannot be done until tomorrow due to full schedule   OBJECTIVE Vitals:   05/22/19 0354 05/22/19 0802 05/22/19 1146 05/22/19 1259  BP: (!) 182/69 (!) 187/72 (!) 209/85 (!) 190/81  Pulse: (!) 50 (!) 51 67 63  Resp: 17 17 17 18   Temp: 98.1 F (36.7 C) 98.5 F (36.9 C) 97.6 F (36.4 C) 98.4 F (36.9 C)  TempSrc: Oral Oral Oral Oral  SpO2: 95% 100% 100% 99%  Weight:      Height:        CBC:  Recent Labs  Lab 05/20/19 0630  WBC 4.5  NEUTROABS Murphy.6  HGB 10.7*  HCT 36.7*  MCV 72.Murphy*  PLT 235    Basic Metabolic Panel:  Recent Labs  Lab 05/20/19 0630  NA 137  K 5.1  CL 108  CO2 19*  GLUCOSE 99  BUN 19  CREATININE 1.99*  CALCIUM 8.4*    Lipid Panel:     Component Value Date/Time   CHOL 108 05/21/2019 0422   CHOL 132 11/21/2018 1103   TRIG 44 05/21/2019 0422   HDL 28 (L) 05/21/2019 0422   HDL 36 (L) 11/21/2018 1103   CHOLHDL 3.9 05/21/2019 0422   VLDL 9 05/21/2019 0422   LDLCALC 71 05/21/2019 0422   LDLCALC 79 11/21/2018 1103   HgbA1c:  Lab Results  Component Value Date   HGBA1C 5.6 11/21/2018   Urine Drug Screen:     Component Value Date/Time   LABOPIA NONE DETECTED 02/23/2014 0910   COCAINSCRNUR NONE DETECTED 02/23/2014 0910   LABBENZ NONE DETECTED 02/23/2014 0910   AMPHETMU NONE DETECTED 02/23/2014 0910   THCU NONE DETECTED 02/23/2014 0910   LABBARB NONE DETECTED 02/23/2014 0910    Alcohol Level     Component Value Date/Time   ETH <11 07/07/2014 2252    IMAGING  Ct Angio Head W Or Wo Contrast Ct Angio Neck W Or Wo Contrast 05/20/2019 IMPRESSION:  1. No major intracranial arterial occlusion or significant proximal stenosis.  Murphy. Cervical carotid artery atherosclerosis without significant stenosis.  3. Small bilateral pleural effusions.  Ct Head Wo  Contrast 05/20/2019 IMPRESSION:  1. No acute intracranial findings.  Murphy. Atrophy and white matter microvascular disease.   Mr Brain Wo Contrast 05/20/2019 IMPRESSION:  1. Severely motion degraded, incomplete examination.  Murphy. Small acute posterior right frontal lobe infarct.     Transthoracic Echocardiogram  05/20/2019 Impression  1. Left ventricular ejection fraction, by visual estimation, is 55 to 60%. The left ventricle has normal function. Normal left ventricular size. There is severely increased left ventricular hypertrophy.  Murphy. Left ventricular diastolic Doppler parameters are consistent with impaired relaxation pattern of LV diastolic filling.  3. Global right ventricle has normal systolic function.The right ventricular size is normal. No increase in right ventricular wall thickness.  4. Left atrial size was normal.  5. Right atrial size was normal.  6. The mitral valve is normal in structure. Mild mitral valve regurgitation. No evidence of mitral stenosis.  7. The tricuspid valve is normal in structure. Tricuspid valve regurgitation was not visualized by color flow Doppler.  8. The aortic valve is normal in structure. Aortic valve regurgitation is mild by color flow Doppler. Mild to moderate aortic valve sclerosis/calcification without any evidence of aortic stenosis.  9. The pulmonic valve was normal  in structure. Pulmonic valve regurgitation is not visualized by color flow Doppler. 10. The inferior vena cava is normal in size with greater than 50% respiratory variability, suggesting right atrial pressure of 3 mmHg.   ECG - SB rate 57 BPM. (See cardiology reading for complete details)    PHYSICAL EXAM Blood pressure (!) 190/81, pulse 63, temperature 98.4 F (36.9 C), temperature source Oral, resp. rate 18, height 5\' 7"  (1.702 m), weight 74.8 kg, SpO2 99 %. Frail elderly African-American lady lying comfortably in bed.  Not in distress. . Afebrile. Head is nontraumatic. Neck  is supple without bruit.    Cardiac exam no murmur or gallop. Lungs are clear to auscultation. Distal pulses are well felt. Neurological Exam :  She is awake alert she is oriented to time and place.  She has mild dysarthria but can be understood.  Extraocular movements are full range without nystagmus.  Blinks to threat bilaterally.  Left lower facial weakness.  Tongue midline.  Motor system exam reveals mild left upper and lower extremity drift with weakness of left grip and intrinsic hand muscles.  Orbits right over left upper extremity.  Sensation appears preserved bilaterally.  Coordination is slow but accurate.  Deep tendon reflexes symmetric.  Plantars are downgoing.  Gait not tested.   ASSESSMENT/PLAN Trevor Murphy is a 84 y.o. 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 He did not receive IV t-PA due to late presentation (>4.5 hours from time of onset)  Stroke: acute posterior right frontal lobe infarct - embolic - source unknown  Resultant dysarthria and left hemiparesis  Code Stroke CT Head - not ordered  CT head -No acute intracranial findings. Atrophy and white matter microvascular disease.    MRI head - Severely motion degraded, incomplete examination. Small acute posterior right frontal lobe infarct.   MRA head - not ordered  CTA H&N - no major intracranial arterial occlusion or significant proximal stenosis. Cervical carotid artery atherosclerosis without significant stenosis. Small bilateral pleural effusions.  CT Perfusion - not ordered  Carotid Doppler - CTA neck performed - carotid dopplers not indicated.  2D Echo - EF 55 to 60%. No cardiac source of emboli identified.   Trevor Murphy - negative  LDL - 71  HgbA1c - not ordered  UDS -  not ordered  VTE prophylaxis - Lovenox Diet  Diet Order            Diet NPO time specified  Diet effective midnight        Diet Heart Room service appropriate? Yes; Fluid consistency: Thin  Diet effective now              aspirin 81 mg daily prior to admission, now on aspirin 81 mg  And Plavix 75 mg daily x 3 weeks and then Plavix alone  Patient counseled to be compliant with his antithrombotic medications  Ongoing aggressive stroke risk factor management  Therapy recommendations: CLR  Disposition:  Pending  Hypertension  Home BP meds: Coreg ; Catapres ; Hydralazine  Current BP meds: Coreg  Blood pressure somewhat high at times but within post stroke/TIA parameters . Permissive hypertension (OK if < 220/120) but gradually normalize in 5-7 days   . Long-term BP goal  normotensive  Hyperlipidemia  Home Lipid lowering medication:  Zocor 20 mg daily  LDL 71, goal < 70  Current lipid lowering medication: Lipitor 40 mg daily   Continue statin at discharge  Diabetes  Home diabetic meds:  Current diabetic meds:  HgbA1c 5.6, goal < 7.0 No results for input(s): GLUCAP in the last 72 hours.  Other Stroke Risk Factors  Advanced age  Hx stroke/TIA  Family hx stroke (sister)  Coronary artery disease  Congestive Heart Failure  Other Active Problems  CKD - creatinine -1.99  Anemia - Hb - 10.7  Hospital day # Murphy    He presented with dysarthria and left hemiparesis secondary to right frontal cortical and subcortical infarct likely of embolic etiology.Recommend TEE and loop recorder tomorrow.  Continue aspirin and Plavix x 3 weeks and then Plavix alone  for stroke prevention.  Long discussion with the patient and his sister at bedside and answered questions.  Discussed with Dr. Loleta Books.  And rehab coordinator greater than 50% time during this 25-minute visit was spent on counseling and coordination of care about her embolic stroke and discussion about evaluation,  treatment and prevention and answering questions.  Antony Contras, MD Medical Director [facility] Pager: [phone] 05/22/2019 1:49 PM   To contact Stroke Continuity provider, please refer to [email or web address] After hours, contact General Neurology

## 2019-05-22 NOTE — Progress Notes (Signed)
Pt discharge education and instructions completed with pt and son Coralyn Mark at bedside; both voices understanding and denies any questions. Pt IV and telemetry removed; pt discharged home with son Coralyn Mark to transport him home. Pt to pick up electronically sent prescriptions from preferred pharmacy on file. Pt transported off unit via wheelchair with son and belongings to the side. Delia Heady RN

## 2019-05-22 NOTE — Progress Notes (Signed)
PT Cancellation Note  Patient Details Name: Trevor Murphy MRN: [id number] DOB: 1935-05-15   Cancelled Treatment:    Reason Eval/Treat Not Completed: Medical issues which prohibited therapy noted elevated BP earlier this morning, rechecked and found to be 168/72, prepared for mobility then nurse tech entered room, checked BP again for routine measures and found pressure to be in the 374U systolically B UEs. MD advises holding PT for now to let patient rest and returning later in day. Will attempt to return if time/schedule allow.    Deniece Ree PT, DPT, CBIS  Supplemental Physical Therapist [facility]    Pager [phone] Acute Rehab Office [phone]

## 2019-05-23 ENCOUNTER — Telehealth: Payer: Self-pay

## 2019-05-23 NOTE — Telephone Encounter (Signed)
I called patient to do his TCM call and also schedule him for a hospital f/u. Left pt v/m to call the office. YRL,RMA

## 2019-05-24 ENCOUNTER — Encounter (HOSPITAL_COMMUNITY)
Admission: RE | Disposition: A | Discharge status: Home / Self Care | Payer: Self-pay | Source: Home / Self Care | Attending: Cardiology

## 2019-05-24 ENCOUNTER — Ambulatory Visit (HOSPITAL_BASED_OUTPATIENT_CLINIC_OR_DEPARTMENT_OTHER): Payer: Medicare Other

## 2019-05-24 ENCOUNTER — Ambulatory Visit (HOSPITAL_COMMUNITY): Payer: Medicare Other | Admitting: Certified Registered Nurse Anesthetist

## 2019-05-24 ENCOUNTER — Other Ambulatory Visit: Payer: Self-pay

## 2019-05-24 ENCOUNTER — Ambulatory Visit (HOSPITAL_COMMUNITY)
Admission: RE | Admit: 2019-05-24 | Discharge: 2019-05-24 | Disposition: A | Discharge status: Home / Self Care | Payer: Medicare Other | Attending: Cardiology | Admitting: Cardiology

## 2019-05-24 ENCOUNTER — Ambulatory Visit (HOSPITAL_COMMUNITY): Admit: 2019-05-24 | Payer: Medicare Other | Admitting: Cardiology

## 2019-05-24 ENCOUNTER — Encounter (HOSPITAL_COMMUNITY): Payer: Self-pay | Admitting: Cardiology

## 2019-05-24 DIAGNOSIS — N183 Chronic kidney disease, stage 3 unspecified: Secondary | ICD-10-CM | POA: Diagnosis not present

## 2019-05-24 DIAGNOSIS — Z7982 Long term (current) use of aspirin: Secondary | ICD-10-CM | POA: Diagnosis not present

## 2019-05-24 DIAGNOSIS — E1122 Type 2 diabetes mellitus with diabetic chronic kidney disease: Secondary | ICD-10-CM | POA: Diagnosis not present

## 2019-05-24 DIAGNOSIS — E785 Hyperlipidemia, unspecified: Secondary | ICD-10-CM | POA: Diagnosis not present

## 2019-05-24 DIAGNOSIS — Z7902 Long term (current) use of antithrombotics/antiplatelets: Secondary | ICD-10-CM | POA: Diagnosis not present

## 2019-05-24 DIAGNOSIS — I340 Nonrheumatic mitral (valve) insufficiency: Secondary | ICD-10-CM

## 2019-05-24 DIAGNOSIS — I2510 Atherosclerotic heart disease of native coronary artery without angina pectoris: Secondary | ICD-10-CM | POA: Diagnosis not present

## 2019-05-24 DIAGNOSIS — I080 Rheumatic disorders of both mitral and aortic valves: Secondary | ICD-10-CM | POA: Diagnosis not present

## 2019-05-24 DIAGNOSIS — I509 Heart failure, unspecified: Secondary | ICD-10-CM | POA: Diagnosis not present

## 2019-05-24 DIAGNOSIS — J90 Pleural effusion, not elsewhere classified: Secondary | ICD-10-CM | POA: Insufficient documentation

## 2019-05-24 DIAGNOSIS — G459 Transient cerebral ischemic attack, unspecified: Secondary | ICD-10-CM | POA: Diagnosis not present

## 2019-05-24 DIAGNOSIS — I130 Hypertensive heart and chronic kidney disease with heart failure and stage 1 through stage 4 chronic kidney disease, or unspecified chronic kidney disease: Secondary | ICD-10-CM | POA: Diagnosis not present

## 2019-05-24 DIAGNOSIS — I639 Cerebral infarction, unspecified: Secondary | ICD-10-CM

## 2019-05-24 DIAGNOSIS — Z01810 Encounter for preprocedural cardiovascular examination: Secondary | ICD-10-CM

## 2019-05-24 DIAGNOSIS — I371 Nonrheumatic pulmonary valve insufficiency: Secondary | ICD-10-CM | POA: Diagnosis not present

## 2019-05-24 HISTORY — PX: BUBBLE STUDY: SHX6837

## 2019-05-24 HISTORY — PX: LOOP RECORDER INSERTION: EP1214

## 2019-05-24 HISTORY — PX: TEE WITHOUT CARDIOVERSION: SHX5443

## 2019-05-24 SURGERY — LOOP RECORDER INSERTION

## 2019-05-24 SURGERY — ECHOCARDIOGRAM, TRANSESOPHAGEAL
Anesthesia: Monitor Anesthesia Care

## 2019-05-24 MED ORDER — LIDOCAINE-EPINEPHRINE 1 %-1:100000 IJ SOLN
INTRAMUSCULAR | Status: AC
  Filled 2019-05-24: qty 1

## 2019-05-24 MED ORDER — BUPIVACAINE HCL (PF) 0.25 % IJ SOLN
INTRAMUSCULAR | Status: DC | PRN
  Administered 2019-05-24: 20 mL

## 2019-05-24 MED ORDER — PROPOFOL 10 MG/ML IV BOLUS
INTRAVENOUS | Status: DC | PRN
  Administered 2019-05-24: 20 mg via INTRAVENOUS

## 2019-05-24 MED ORDER — GLYCOPYRROLATE PF 0.2 MG/ML IJ SOSY
PREFILLED_SYRINGE | INTRAMUSCULAR | Status: DC | PRN
  Administered 2019-05-24: .2 mg via INTRAVENOUS

## 2019-05-24 MED ORDER — SODIUM CHLORIDE 0.9 % IV SOLN
INTRAVENOUS | Status: DC
  Administered 2019-05-24 (×2): via INTRAVENOUS

## 2019-05-24 MED ORDER — DEXMEDETOMIDINE HCL 200 MCG/2ML IV SOLN
INTRAVENOUS | Status: DC | PRN
  Administered 2019-05-24: 20 ug via INTRAVENOUS

## 2019-05-24 MED ORDER — PROPOFOL 500 MG/50ML IV EMUL
INTRAVENOUS | Status: DC | PRN
  Administered 2019-05-24: 75 ug/kg/min via INTRAVENOUS

## 2019-05-24 SURGICAL SUPPLY — 2 items
MONITOR REVEAL LINQ II (Prosthesis & Implant Heart) ×2 IMPLANT
PACK LOOP INSERTION (CUSTOM PROCEDURE TRAY) ×2 IMPLANT

## 2019-05-24 NOTE — CV Procedure (Signed)
    TRANSESOPHAGEAL ECHOCARDIOGRAM   NAME:  Trevor Murphy   MRN: [id number] DOB:  Jun 25, 1935   ADMIT DATE: 05/24/2019  INDICATIONS: CVA  PROCEDURE:   Informed consent was obtained prior to the procedure. The risks, benefits and alternatives for the procedure were discussed and the patient comprehended these risks.  Risks include, but are not limited to, cough, sore throat, vomiting, nausea, somnolence, esophageal and stomach trauma or perforation, bleeding, low blood pressure, aspiration, pneumonia, infection, trauma to the teeth and death.    Procedural time out performed. The oropharynx was anesthetized with topical 1% benzocaine.    Anesthesia was administered by  Edmonia James CRNA and Dr Sabra Heck.  The transesophageal probe was inserted in the esophagus and stomach without difficulty and multiple views were obtained.    COMPLICATIONS:    There were no immediate complications.  FINDINGS:  LEFT VENTRICLE: EF = 60-65%. No regional wall motion abnormalities.  RIGHT VENTRICLE: Normal size and function.   LEFT ATRIUM: No thrombus/mass.  LEFT ATRIAL APPENDAGE: No thrombus/mass.   RIGHT ATRIUM: No thrombus/mass.  AORTIC VALVE:  Trileaflet. Moderate regurgitation. No vegetation.  MITRAL VALVE:    Mild-Moderate regurgitation. No vegetation.  TRICUSPID VALVE: Normal structure. No regurgitation. No vegetation.  PULMONIC VALVE: Grossly normal structure. Mild regurgitation. No apparent vegetation.  INTERATRIAL SEPTUM: Positive bubble study, consistent with small PFO  PERICARDIUM: No effusion noted.  DESCENDING AORTA: Mild plaque seen   CONCLUSION: Positive bubble study, consistent with small PFO.  Moderate AI.  Mild-moderate MR.  [name] MD [place]  [address] [place], [place] [address] [phone]   3:19 PM

## 2019-05-24 NOTE — Interval H&P Note (Signed)
History and Physical Interval Note:  05/24/2019 9:46 AM  Trevor Murphy  has presented today for surgery, with the diagnosis of STROKE.  The various methods of treatment have been discussed with the patient and family. After consideration of risks, benefits and other options for treatment, the patient has consented to  Procedure(s): TRANSESOPHAGEAL ECHOCARDIOGRAM (TEE) (N/A) as a surgical intervention.  The patient's history has been reviewed, patient examined, no change in status, stable for surgery.  I have reviewed the patient's chart and labs.  Questions were answered to the patient's satisfaction.     Donato Heinz

## 2019-05-24 NOTE — Anesthesia Postprocedure Evaluation (Signed)
Anesthesia Post Note  Patient: Trevor Murphy  Procedure(s) Performed: TRANSESOPHAGEAL ECHOCARDIOGRAM (TEE) (N/A ) BUBBLE STUDY     Patient location during evaluation: Endoscopy Anesthesia Type: MAC Level of consciousness: awake and alert Pain management: pain level controlled Vital Signs Assessment: post-procedure vital signs reviewed and stable Respiratory status: spontaneous breathing, nonlabored ventilation and respiratory function stable Cardiovascular status: stable and blood pressure returned to baseline Postop Assessment: no apparent nausea or vomiting Anesthetic complications: no    Last Vitals:  Vitals:   05/24/19 1147 05/24/19 1200  BP: (!) 100/53 (!) 93/47  Pulse: 66 (!) 59  Resp: 19 14  Temp: 36.6 C   SpO2: 100% 100%    Last Pain:  Vitals:   05/24/19 1147  TempSrc: Temporal  PainSc:                  Lynda Rainwater

## 2019-05-24 NOTE — Progress Notes (Signed)
Lower ext venous duplex  has been completed. Refer to [facility] under chart review to view preliminary results.   05/24/2019  1:28 PM Trevor Murphy, Bonnye Fava

## 2019-05-24 NOTE — Anesthesia Preprocedure Evaluation (Signed)
Anesthesia Evaluation  Patient identified by MRN, date of birth, ID band Patient awake    Reviewed: Allergy & Precautions, H&P , NPO status , Patient's Chart, lab work & pertinent test results  History of Anesthesia Complications Negative for: history of anesthetic complications  Airway Mallampati: II  TM Distance: >3 FB Neck ROM: Full    Dental  (+) Edentulous Upper, Missing, Dental Advisory Given, Poor Dentition   Pulmonary former smoker,    Pulmonary exam normal breath sounds clear to auscultation       Cardiovascular hypertension, Pt. on medications + CAD and + Peripheral Vascular Disease  Normal cardiovascular exam Rhythm:Regular Rate:Normal  12/13 ECHO: EF 55-60%, mild MR, mild AI   Neuro/Psych  Headaches, TIACVA    GI/Hepatic Neg liver ROS, GERD  ,  Endo/Other  negative endocrine ROS  Renal/GU Renal InsufficiencyRenal disease (creat 1.53)     Musculoskeletal  (+) Arthritis ,   Abdominal   Peds  Hematology negative hematology ROS (+)   Anesthesia Other Findings   Reproductive/Obstetrics                             Anesthesia Physical  Anesthesia Plan  ASA: III  Anesthesia Plan: MAC   Post-op Pain Management:    Induction:   PONV Risk Score and Plan: 1 and Ondansetron and Treatment may vary due to age or medical condition  Airway Management Planned: Natural Airway and Nasal Cannula  Additional Equipment:   Intra-op Plan:   Post-operative Plan:   Informed Consent: I have reviewed the patients History and Physical, chart, labs and discussed the procedure including the risks, benefits and alternatives for the proposed anesthesia with the patient or authorized representative who has indicated his/her understanding and acceptance.     Dental advisory given  Plan Discussed with: CRNA and Surgeon  Anesthesia Plan Comments: (Plan routine monitors, MAC)         Anesthesia Quick Evaluation

## 2019-05-24 NOTE — Discharge Instructions (Signed)
Implant site/wound care instructions Keep incision clean and dry for 3 days.  You can remove outer dressing tomorrow. Leave steri-strips (little pieces of tape) on until seen in the office for wound check appointment. Call the office [phone]) for redness, drainage, swelling, or fever.   TEE  YOU HAD AN CARDIAC PROCEDURE TODAY: Refer to the procedure report and other information in the discharge instructions given to you for any specific questions about what was found during the examination. If this information does not answer your questions, please call [facility] office at [phone] to clarify.   DIET: Your first meal following the procedure should be a light meal and then it is ok to progress to your normal diet. A half-sandwich or bowl of soup is an example of a good first meal. Heavy or fried foods are harder to digest and may make you feel nauseous or bloated. Drink plenty of fluids but you should avoid alcoholic beverages for 24 hours. If you had a esophageal dilation, please see attached instructions for diet.   ACTIVITY: Your care partner should take you home directly after the procedure. You should plan to take it easy, moving slowly for the rest of the day. You can resume normal activity the day after the procedure however YOU SHOULD NOT DRIVE, use power tools, machinery or perform tasks that involve climbing or major physical exertion for 24 hours (because of the sedation medicines used during the test).   SYMPTOMS TO REPORT IMMEDIATELY: A cardiologist can be reached at any hour. Please call [phone] for any of the following symptoms:  Vomiting of blood or coffee ground material  New, significant abdominal pain  New, significant chest pain or pain under the shoulder blades  Painful or persistently difficult swallowing  New shortness of breath  Black, tarry-looking or red, bloody stools  FOLLOW UP:  Please also call with any specific questions about appointments or  follow up tests.

## 2019-05-24 NOTE — Transfer of Care (Signed)
Immediate Anesthesia Transfer of Care Note  Patient: Trevor Murphy  Procedure(s) Performed: TRANSESOPHAGEAL ECHOCARDIOGRAM (TEE) (N/A ) BUBBLE STUDY  Patient Location: Endoscopy Unit  Anesthesia Type:MAC  Level of Consciousness: drowsy  Airway & Oxygen Therapy: Patient Spontanous Breathing and Patient connected to nasal cannula oxygen  Post-op Assessment: Report given to RN and Post -op Vital signs reviewed and stable  Post vital signs: Reviewed and stable  Last Vitals:  Vitals Value Taken Time  BP    Temp    Pulse    Resp    SpO2      Last Pain:  Vitals:   05/24/19 0942  TempSrc: Oral  PainSc: 0-No pain         Complications: No apparent anesthesia complications

## 2019-05-24 NOTE — Progress Notes (Signed)
  Echocardiogram Echocardiogram Transesophageal has been performed.  Trevor Murphy 05/24/2019, 11:59 AM

## 2019-05-24 NOTE — Progress Notes (Signed)
Patient admitted to ENDO for TEE this morning, stayed with [place] from [phone] due to needing LE dopper [place] and loop recorder. Family updated along the way during his stay.

## 2019-05-24 NOTE — Consult Note (Addendum)
ELECTROPHYSIOLOGY CONSULT NOTE  Patient ID: Trevor Murphy MRN: [id number], DOB/AGE: Dec 23, 1934   Admit date: 05/24/2019 Date of Consult: 05/24/2019  Primary Physician: Glendale Chard, MD Primary Cardiologist:  Reason for Consultation: Cryptogenic stroke ; recommendations regarding Implantable Loop Recorder, requested by Dr. Leonie Man  History of Present Illness Trevor Murphy comes today for TEE and possible loop implant He was admitted on 05/20/2019 with stroke, discharged 05/22/2019 planned for outpatient TEE/loop recorder given TEE schedule was unavailable for a couple days.  He presented with L sided weakness, slurred speech with h is stroke, he reports remains with some L sided weakness, but slowly improving. PMHX includes: HTN, nonobstructive CAD, prior strokes, h/o ETOH (none in a year), blind L eye, minimal vision R Neurology found/noted:acute posterior right frontal lobe infarct - embolic - source unknown .  he has undergone workup for stroke including echocardiogram and carotid angio.   Echocardiogram  05/20/2019 Impression  1. Left ventricular ejection fraction, by visual estimation, is 55 to 60%. The left ventricle has normal function. Normal left ventricular size. There is severely increased left ventricular hypertrophy.  2. Left ventricular diastolic Doppler parameters are consistent with impaired relaxation pattern of LV diastolic filling.  3. Global right ventricle has normal systolic function.The right ventricular size is normal. No increase in right ventricular wall thickness.  4. Left atrial size was normal.  5. Right atrial size was normal.  6. The mitral valve is normal in structure. Mild mitral valve regurgitation. No evidence of mitral stenosis.  7. The tricuspid valve is normal in structure. Tricuspid valve regurgitation was not visualized by color flow Doppler.  8. The aortic valve is normal in structure. Aortic valve regurgitation is mild by color flow Doppler. Mild  to moderate aortic valve sclerosis/calcification without any evidence of aortic stenosis.  9. The pulmonic valve was normal in structure. Pulmonic valve regurgitation is not visualized by color flow Doppler. 10. The inferior vena cava is normal in size with greater than 50% respiratory variability, suggesting right atrial pressure of 3 mmHg.  Lab work is reviewed.  He denies any CP or syncope, no SOB or DOE.  He has rare brief palpitations only   Past Medical History:  Diagnosis Date   Arthritis    CAD (coronary artery disease)    a. Cath 08/2009: minimal nonobstructive disease (25% LAD). b. Lexiscan nuc 6/215: EF 54%, no ischemia or infarction.   Chronic diastolic CHF (congestive heart failure) ([place])    a. Echo 2013: EF 55-60%, mild AI, mild MR. b. Echo 5/15 with EF 60-65%, no WMA, mild LVH, mild AI.   CKD (chronic kidney disease) stage 3, GFR 30-59 ml/min 07/26/2012   a. Felt likely due to HTN.   CVA (cerebral infarction) 07/28/2012   Glaucoma    Gout    H/O umbilical hernia repair    Headache(784.0)    " WHEN MY BLOOD PRESSURE IS UP"   History of acute renal failure    History of alcohol abuse    HTN (hypertension)    Hypertension    a. Patent renal arteries 2005.   Noncompliance    OA (osteoarthritis)    Other and unspecified hyperlipidemia    Poor circulation    Sinus bradycardia    SVT (supraventricular tachycardia) ([place])    a. Possible AVNRT.  Got adenosine from EMS in 2015 for SVT.  During 5/15 hospitalization had episode of short R-P tachycardia (possible AVNRT) terminated by adenosine.  b. 04/2016 - recurrent tachycardia (  SVT vs. AVNRT), again terminated by Adenosine   Visual impairment      Surgical History:  Past Surgical History:  Procedure Laterality Date   APPENDECTOMY     CARDIAC CATHETERIZATION  11/2003   EF 50-55%   CARDIAC CATHETERIZATION  08/2009   CATARACT EXTRACTION EXTRACAPSULAR Right 05/17/2013   Procedure: CATARACT EXTRACTION  EXTRACAPSULAR WITH INTRAOCULAR LENS PLACEMENT (IOC) RIGHT EYE;  Surgeon: Marylynn Pearson, MD;  Location: [facility];  Service: Ophthalmology;  Laterality: Right;   COLONOSCOPY     HERNIA REPAIR     VENTRAL HERNIA REPAIR  03/07/2012   Procedure: LAPAROSCOPIC VENTRAL HERNIA;  Surgeon: Gwenyth Ober, MD;  Location: [facility];  Service: General;  Laterality: N/A;     Medications Prior to Admission  Medication Sig Dispense Refill Last Dose   acetaminophen (TYLENOL) 500 MG tablet Take 1,000 mg by mouth every 6 (six) hours as needed for headache.   05/23/2019 at Unknown time   allopurinol (ZYLOPRIM) 100 MG tablet TAKE ONE TABLET BY MOUTH ONCE DAILY TO PREVENT GOUT (Patient taking differently: Take 100 mg by mouth daily. TAKE ONE TABLET BY MOUTH ONCE DAILY TO PREVENT GOUT) 90 tablet 1 05/24/2019 at Unknown time   aspirin EC 81 MG tablet Take 1 tablet (81 mg total) by mouth daily. 90 tablet 3 05/23/2019 at Unknown time   atorvastatin (LIPITOR) 80 MG tablet Take 1 tablet (80 mg total) by mouth daily at 6 PM. 30 tablet 3 05/23/2019 at Unknown time   carvedilol (COREG) 6.25 MG tablet TAKE 1 TABLET (6.25MG ) BY MOUTH TWO TIMES EVERY DAY WITH FOOD (Patient taking differently: Take 6.25 mg by mouth 2 (two) times daily with a meal. TAKE 1 TABLET (6.25MG ) BY MOUTH TWO TIMES EVERY DAY WITH FOOD) 180 tablet 1 05/24/2019 at Unknown time   cloNIDine (CATAPRES) 0.2 MG tablet Take 1 tablet (0.2 mg total) by mouth daily. 90 tablet 1 05/24/2019 at Unknown time   docusate sodium (COLACE) 100 MG capsule Take 1 capsule (100 mg total) by mouth 2 (two) times daily. 10 capsule 0 05/23/2019 at Unknown time   dorzolamide (TRUSOPT) 2 % ophthalmic solution Place 1 drop into both eyes 3 (three) times daily.   05/23/2019 at Unknown time   ferrous sulfate 325 (65 FE) MG tablet Take 1 tablet (325 mg total) by mouth 2 (two) times daily with a meal. 30 tablet 3 05/23/2019 at Unknown time   hydrALAZINE (APRESOLINE) 100 MG tablet TAKE 1  TABLET (100 MG TOTAL) BY MOUTH THREE TIMES DAILY. **NEEDS TO CALL FOR OCTOBER APPOINTMENT** (Patient taking differently: Take 100 mg by mouth 3 (three) times daily. ) 15 tablet 0 05/23/2019 at Unknown time   LUMIGAN 0.01 % SOLN    05/23/2019 at Unknown time   potassium chloride (K-DUR) 10 MEQ tablet Take 10 mEq by mouth every other day.    05/23/2019 at Unknown time   Travoprost, BAK Free, (TRAVATAN) 0.004 % SOLN ophthalmic solution Place 1 drop into both eyes at bedtime.   05/23/2019 at Unknown time   clopidogrel (PLAVIX) 75 MG tablet Take 1 tablet (75 mg total) by mouth daily. 30 tablet 2 Unknown at Unknown time   nitroGLYCERIN (NITROSTAT) 0.4 MG SL tablet Place 0.4 mg under the tongue every 5 (five) minutes as needed for chest pain. X 3 doses   Unknown at Unknown time    Inpatient Medications:   Allergies: No Known Allergies  Social History   Socioeconomic History   Marital status: Married  Spouse name: Not on file   Number of children: Not on file   Years of education: Not on file   Highest education level: Not on file  Occupational History   Occupation: retired  Scientist, product/process development strain: Not hard at [employer] insecurity    Worry: Never true    Inability: Never true   Transportation needs    Medical: No    Non-medical: No  Tobacco Use   Smoking status: Never Smoker   Smokeless tobacco: Never Used  Substance and Sexual Activity   Alcohol use: No    Alcohol/week: 1.0 standard drinks    Types: 1 Cans of beer per week    Comment: h/o heavy use, quit in 2017   Drug use: No   Sexual activity: Not Currently  Lifestyle   Physical activity    Days per week: 0 days    Minutes per session: 0 min   Stress: Not at all  Relationships   Social connections    Talks on phone: Not on file    Gets together: Not on file    Attends religious service: Not on file    Active member of club or organization: Not on file    Attends meetings of clubs  or organizations: Not on file    Relationship status: Not on file   Intimate partner violence    Fear of current or ex partner: No    Emotionally abused: No    Physically abused: No    Forced sexual activity: No  Other Topics Concern   Not on file  Social History Narrative   Not on file     Family History  Problem Relation Age of Onset   CVA Mother    Hypertension Brother    Hypertension Sister    Hypertension Brother    Stroke Sister    Heart attack Neg Hx       Review of Systems: All other systems reviewed and are otherwise negative except as noted above.  Physical Exam: Vitals:   05/24/19 0942  BP: (!) 155/61  Pulse: (!) 47  Resp: 12  Temp: 98 F (36.7 C)  TempSrc: Oral  SpO2: 100%    GEN- The patient is well appearing, alert and oriented x 3 today.   Head- normocephalic, atraumatic Eyes-  Sclera clear, conjunctiva pink Ears- hearing intact Oropharynx- clear Neck- supple Lungs- CTA b/l, normal work of breathing Heart- RRR, no murmurs, rubs or gallops  GI- soft, NT, ND Extremities- no clubbing, cyanosis, or edema MS- no significant deformity, age appropriate atrophy Skin- no rash or lesion Psych- euthymic mood, full affect   Labs:   Lab Results  Component Value Date   WBC 4.5 05/20/2019   HGB 10.7 (L) 05/20/2019   HCT 36.7 (L) 05/20/2019   MCV 72.2 (L) 05/20/2019   PLT 189 05/20/2019    Recent Labs  Lab 05/20/19 0630  NA 137  K 5.1  CL 108  CO2 19*  BUN 19  CREATININE 1.99*  CALCIUM 8.4*  PROT 7.3  BILITOT 0.5  ALKPHOS 68  ALT 18  AST 18  GLUCOSE 99   Lab Results  Component Value Date   CKTOTAL 93 03/19/2010   CKMB 2.4 03/19/2010   TROPONINI <0.30 04/12/2014   Lab Results  Component Value Date   CHOL 108 05/21/2019   CHOL 132 11/21/2018   CHOL 87 (L) 07/22/2018   Lab Results  Component Value Date  HDL 28 (L) 05/21/2019   HDL 36 (L) 11/21/2018   HDL 33 (L) 07/22/2018   Lab Results  Component Value Date    LDLCALC 71 05/21/2019   LDLCALC 79 11/21/2018   LDLCALC 42 07/22/2018   Lab Results  Component Value Date   TRIG 44 05/21/2019   TRIG 86 11/21/2018   TRIG 59 07/22/2018   Lab Results  Component Value Date   CHOLHDL 3.9 05/21/2019   CHOLHDL 3.7 11/21/2018   CHOLHDL 2.6 07/22/2018   No results found for: LDLDIRECT  Lab Results  Component Value Date   DDIMER 1.43 (H) 12/18/2013     Radiology/Studies:   Ct Angio Head W Or Wo Contrast Result Date: 05/20/2019 CLINICAL DATA:  Stroke follow-up. Acute posterior right frontal lobe infarct on MRI. Left arm weakness. EXAM: CT ANGIOGRAPHY HEAD AND NECK TECHNIQUE: Multidetector CT imaging of the head and neck was performed using the standard protocol during bolus administration of intravenous contrast. Multiplanar CT image reconstructions and MIPs were obtained to evaluate the vascular anatomy. Carotid stenosis measurements (when applicable) are obtained utilizing NASCET criteria, using the distal internal carotid diameter as the denominator. CONTRAST:  68mL OMNIPAQUE IOHEXOL 350 MG/ML SOLN COMPARISON:  None. FINDINGS: CTA NECK FINDINGS Aortic arch: Normal variant aortic arch branching pattern with common origin of the brachiocephalic and left common carotid arteries. Mild arch atherosclerosis without arch vessel origin stenosis. Right carotid system: Patent with mild calcified plaque at the carotid bifurcation. No evidence of significant stenosis or dissection. Tortuous proximal common carotid artery. Left carotid system: Patent with mild-to-moderate calcified and soft plaque at the carotid bifurcation. No evidence of significant stenosis or dissection. Tortuous proximal common carotid artery. Vertebral arteries: Patent and codominant without evidence of significant stenosis or dissection. Mildly limited assessment of the left V1 segment due to venous contrast. Skeleton: Moderate to severe multilevel cervical disc and facet degeneration. Other neck: No  evidence of cervical lymphadenopathy or mass. Upper chest: Small bilateral pleural effusions. Mild dependent atelectasis in both lungs. Review of the MIP images confirms the above findings CTA HEAD FINDINGS Anterior circulation: The internal carotid arteries are widely patent from skull base to carotid termini. ACAs and MCAs are patent without evidence of proximal branch occlusion or significant proximal stenosis. The right A1 segment is absent. No aneurysm is identified. Posterior circulation: The intracranial vertebral arteries are patent to the basilar. Patent bilateral PICA, left AICA, and bilateral SCA origins are identified. The basilar artery is widely patent. There are fetal origins of both PCAs. PCA branch vessel irregularity is noted bilaterally without flow limiting proximal stenosis. No aneurysm is identified. Venous sinuses: Patent. Anatomic variants: Absent right A1. Fetal origin of the PCAs. Review of the MIP images confirms the above findings IMPRESSION: 1. No major intracranial arterial occlusion or significant proximal stenosis. 2. Cervical carotid artery atherosclerosis without significant stenosis. 3. Small bilateral pleural effusions. Electronically Signed   By: Logan Bores M.D.   On: 05/20/2019 13:17     Ct Head Wo Contrast Result Date: 05/20/2019 CLINICAL DATA:  Per patient "he has had left arm weakness for 2 days"Focal neuro deficit, > 6 hrs, stroke suspected Possible stroke EXAM: CT HEAD WITHOUT CONTRAST TECHNIQUE: Contiguous axial images were obtained from the base of the skull through the vertex without intravenous contrast. COMPARISON:  None. FINDINGS: Brain: No acute intracranial hemorrhage. No focal mass lesion. No CT evidence of acute infarction. No midline shift or mass effect. No hydrocephalus. Basilar cisterns are patent. There are periventricular  and subcortical white matter hypodensities. Generalized cortical atrophy. Vascular: No hyperdense vessel or unexpected  calcification. Skull: Normal. Negative for fracture or focal lesion. Sinuses/Orbits: Paranasal sinuses and mastoid air cells are clear. Orbits are clear. Other: None. IMPRESSION: 1. No acute intracranial findings. 2. Atrophy and white matter microvascular disease. Electronically Signed   By: Suzy Bouchard M.D.   On: 05/20/2019 07:46     Mr Brain Wo Contrast Result Date: 05/20/2019 CLINICAL DATA:  Left arm weakness. EXAM: MRI HEAD WITHOUT CONTRAST TECHNIQUE: Multiplanar, multiecho pulse sequences of the brain and surrounding structures were obtained without intravenous contrast. COMPARISON:  Head CT 05/20/2019 and MRI 02/23/2014 FINDINGS: The patient was unable to tolerate the examination which was terminated prematurely. Axial and coronal diffusion sequences and an axial FLAIR sequence were obtained and are severely motion degraded. There is a small region of diffusion signal abnormality involving cortex and white matter in the posterior right frontal lobe consistent with an acute infarct. No gross intracranial hemorrhage, intracranial mass effect, or extra-axial fluid collection is identified. Background moderate chronic small vessel ischemic changes are not well evaluated. There is mild-to-moderate cerebral atrophy. IMPRESSION: 1. Severely motion degraded, incomplete examination. 2. Small acute posterior right frontal lobe infarct. Electronically Signed   By: Logan Bores M.D.   On: 05/20/2019 10:11     12-lead ECG SR, SB, he has had SVT, no AFib All prior EKG's in EPIC reviewed with no documented atrial fibrillation  Telemetry CV strips reviewed, no AFib, noted SVT remotely only  Assessment and Plan:  1. Cryptogenic stroke The patient presents with cryptogenic stroke.  The patient has a TEE planned for this AM.  I spoke at length with the patient about monitoring for afib with either a 30 day event monitor or an implantable loop recorder.   He recalled being told in the hospital that  monitoring was recommended.  I also confirmed with neurology NP, they would like loop implant if TEE is negative.  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   Risks, benefits, and alteratives to implantable loop recorder were discussed with the patient today.   At this time, the patient is very clear in his decision to proceed with implantable loop recorder.   He has seen Dr. Rayann Heman a few years ago 2/2 the SVT, offerred to have him f/u in office with Dr. Rayann Heman, but prefers to have implant today rather then another appointment/trip, given he requires getting rides for testing, visits. He understands we Caitlan Chauca have a wound check visit at least and is OK.  I have reached out to our office, EP RN in communication with our pre-auth/insurance personnel from an insurance perspective is OK to proceed with implant, no pre-auth is required.  Wound care was reviewed with the patient (keep incision clean and dry for 3 days).  Wound check Brenn Deziel be scheduled for the patient  Please call with questions.   Baldwin Jamaica, PA-C 05/24/2019  I have seen and examined this patient with Tommye Standard.  Agree with above, note added to reflect my findings.  On exam, RRR, no murmurs, lungs clear.  Patient presented to the hospital with cryptogenic stroke. To date, no cause has been found. TEE planned for today. If unrevealing, Derrius Furtick plan for LINQ monitor to look for atrial fibrillation. Risks and benefits discussed. Risks include but not limited to bleeding and infection. The patient understands the  risks and has agreed to the procedure.  Lucina Betty M. Mayo Faulk MD 05/24/2019 12:27 PM

## 2019-05-25 ENCOUNTER — Telehealth: Payer: Self-pay

## 2019-05-25 ENCOUNTER — Encounter (HOSPITAL_COMMUNITY): Payer: Self-pay | Admitting: Cardiology

## 2019-05-25 NOTE — Telephone Encounter (Signed)
I called patient to schedule him a hospital f/u and do his tcm call. I left pt v/m to call the office [facility]

## 2019-06-06 ENCOUNTER — Ambulatory Visit (INDEPENDENT_AMBULATORY_CARE_PROVIDER_SITE_OTHER): Payer: Medicare Other | Admitting: *Deleted

## 2019-06-06 ENCOUNTER — Other Ambulatory Visit: Payer: Self-pay

## 2019-06-06 DIAGNOSIS — I639 Cerebral infarction, unspecified: Secondary | ICD-10-CM

## 2019-06-06 LAB — CUP PACEART INCLINIC DEVICE CHECK
Date Time Interrogation Session: 20201027100819
Implantable Pulse Generator Implant Date: 20201014

## 2019-06-06 NOTE — Patient Instructions (Signed)
Stop taking carvedilol ( Coreg) due to decreased heart rate. Call office if you have increased dizziness with position change. Go to emergency room if you develop chest pain, shortness of breath or pass out.

## 2019-06-06 NOTE — Addendum Note (Signed)
Addended by: Drake Leach on: 06/06/2019 04:11 PM   Modules accepted: Orders

## 2019-06-06 NOTE — Progress Notes (Signed)
ILR wound check in clinic. Steri strips removed prior to visit. Wound well healed. Home monitor transmitting nightly. No episodes. HR SB at 30. patient c/o dizziness with ambulation intermittently. Stays in wheel chair or chair at home. Dr Curt Bears evaluated patient and  Coreg discontinued.  Brady detection programmed on for < 40 bpm, > 12 beats. ED precautions covered with family.  Questions answered.

## 2019-06-16 ENCOUNTER — Ambulatory Visit: Payer: Medicare Other | Admitting: Podiatry

## 2019-06-27 ENCOUNTER — Ambulatory Visit (INDEPENDENT_AMBULATORY_CARE_PROVIDER_SITE_OTHER): Payer: Medicare Other | Admitting: *Deleted

## 2019-06-27 DIAGNOSIS — I471 Supraventricular tachycardia: Secondary | ICD-10-CM

## 2019-06-27 DIAGNOSIS — I5032 Chronic diastolic (congestive) heart failure: Secondary | ICD-10-CM | POA: Diagnosis not present

## 2019-06-28 LAB — CUP PACEART REMOTE DEVICE CHECK
Date Time Interrogation Session: 20201117181815
Implantable Pulse Generator Implant Date: 20201014

## 2019-07-05 ENCOUNTER — Other Ambulatory Visit: Payer: Self-pay

## 2019-07-05 ENCOUNTER — Ambulatory Visit (INDEPENDENT_AMBULATORY_CARE_PROVIDER_SITE_OTHER): Payer: Medicare Other | Admitting: Adult Health

## 2019-07-05 ENCOUNTER — Encounter: Payer: Self-pay | Admitting: Adult Health

## 2019-07-05 VITALS — BP 154/72 | HR 72 | Temp 97.6°F | Ht 67.0 in | Wt 141.6 lb

## 2019-07-05 DIAGNOSIS — I639 Cerebral infarction, unspecified: Secondary | ICD-10-CM

## 2019-07-05 DIAGNOSIS — E785 Hyperlipidemia, unspecified: Secondary | ICD-10-CM | POA: Diagnosis not present

## 2019-07-05 DIAGNOSIS — I10 Essential (primary) hypertension: Secondary | ICD-10-CM

## 2019-07-05 DIAGNOSIS — Z95818 Presence of other cardiac implants and grafts: Secondary | ICD-10-CM

## 2019-07-05 NOTE — Patient Instructions (Signed)
Continue clopidogrel 75 mg daily  and lipitor  for secondary stroke prevention  Continue to follow up with PCP regarding cholesterol and blood pressure management   Follow up with Dr. Curt Bears as scheduled  Your loop recorder will continue to be monitored routinely   Continue to monitor blood pressure at home  Maintain strict control of hypertension with blood pressure goal below 130/90, diabetes with hemoglobin A1c goal below 6.5% and cholesterol with LDL cholesterol (bad cholesterol) goal below 70 mg/dL. I also advised the patient to eat a healthy diet with plenty of whole grains, cereals, fruits and vegetables, exercise regularly and maintain ideal body weight.  Followup in the future with me in 4 months or call earlier if needed       Thank you for coming to see [place] at [facility]. I hope we have been able to provide you high quality care today.  You may receive a patient satisfaction survey over the next few weeks. We would appreciate your feedback and comments so that we may continue to improve ourselves and the health of our patients.

## 2019-07-05 NOTE — Progress Notes (Signed)
[facility] [address] [place]. [place] [address] [phone]       HOSPITAL FOLLOW UP NOTE  [name] Date of Birth:  1935-04-23 Medical Record Number:  [id number]   Reason for Referral:  hospital stroke follow up    CHIEF COMPLAINT:  Chief Complaint  Patient presents with  . Hospitalization Follow-up    Sister present. Treatment room. No new concerns at this time.     HPI: Trevor Murphy being seen today for in office hospital follow-up regarding cryptogenic posterior right frontal lobe infarct on 05/20/2019.  History obtained from patient, sister and chart review. Reviewed all radiology images and labs personally.  Mr. GEARALD Murphy is a 84 y.o. 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  Stroke work-up showed acute posterior right frontal lobe infarct as evidenced on MRI embolic pattern secondary to unknown source. He did not receive IV t-PA due to late presentation (>4.5 hours from time of onset).  CTA head/neck negative E LVO or significant stenosis.  2D echo normal EF without cardiac source of embolus identified.  SARS coronavirus 2 negative.  Recommended DAPT for 3 weeks and Plavix alone as previously on aspirin.  HTN stable.  LDL 71 initiated atorvastatin 40 mg daily.  A1c 5.6.  Electrolytes factors include advanced age, history of stroke, family history of stroke, CAD and CHF.  Recommended TEE with possible loop recorder but unable to complete inpatient due to scheduling therefore TEE with loop recorder scheduled on 05/24/2019.  He was discharged with recommendation of home health therapies on 05/22/2019 in stable condition.  TEE completed on 05/24/2019 which did show small PFO and proceeded with loop recorder placement.  [name] is an 84 year old male who is being seen today for hospital follow-up accompanied by his sister.  He has recovered well from a stroke standpoint without residual deficits or reoccurring symptoms.  He does live alone but has a caregiver that assists for a couple hrs daily.  His sister also checks on him frequently and assist with medical care.  Loop recorder has not shown atrial fibrillation thus far.  Completed 3 weeks DAPT and continues on Plavix alone without bleeding or bruising.  Continues on atorvastatin 40 mg daily without myalgias.  Blood pressure today 154/72. He does not monitor blood pressure at home.  Sister states antihypertensives recently discontinued by cardiology due to symptomatic bradycardia.  Heart rate today 72 and denies any additional dizziness or bradycardic episodes.  He has follow-up with cardiology next week.  No further concerns at this time.    ROS:   14 system review of systems performed and negative with exception of no complaints  PMH:  Past Medical History:  Diagnosis Date  . Arthritis   . CAD (coronary artery disease)    a. Cath 08/2009: minimal nonobstructive disease (25% LAD). b. Lexiscan nuc 6/215: EF 54%, no ischemia or infarction.  . Chronic diastolic CHF (congestive heart failure) ([place])    a. Echo 2013: EF 55-60%, mild AI, mild MR. b. Echo 5/15 with EF 60-65%, no WMA, mild LVH, mild AI.  [name] Kitchen CKD (chronic kidney disease) stage 3, GFR 30-59 ml/min 07/26/2012   a. Felt likely due to HTN.  . CVA (cerebral infarction) 07/28/2012  .  Glaucoma   . Gout   . H/O umbilical hernia repair   . Headache(784.0)    " WHEN MY BLOOD PRESSURE IS UP"  . History of acute renal failure   . History of alcohol abuse   . HTN (hypertension)   . Hypertension    a. Patent renal arteries 2005.  [name] Kitchen Noncompliance   . OA (osteoarthritis)   . Other and unspecified hyperlipidemia   . Poor circulation   . Sinus bradycardia   . SVT (supraventricular tachycardia) ([place])    a.  Possible AVNRT.  Got adenosine from EMS in 2015 for SVT.  During 5/15 hospitalization had episode of short R-P tachycardia (possible AVNRT) terminated by adenosine.  b. 04/2016 - recurrent tachycardia (SVT vs. AVNRT), again terminated by Adenosine  . Visual impairment     PSH:  Past Surgical History:  Procedure Laterality Date  . APPENDECTOMY    . BUBBLE STUDY  05/24/2019   Procedure: BUBBLE STUDY;  Surgeon: Donato Heinz, MD;  Location: [facility];  Service: Endoscopy;;  . CARDIAC CATHETERIZATION  11/2003   EF 50-55%  . CARDIAC CATHETERIZATION  08/2009  . CATARACT EXTRACTION EXTRACAPSULAR Right 05/17/2013   Procedure: CATARACT EXTRACTION EXTRACAPSULAR WITH INTRAOCULAR LENS PLACEMENT (IOC) RIGHT EYE;  Surgeon: Marylynn Pearson, MD;  Location: [facility];  Service: Ophthalmology;  Laterality: Right;  . COLONOSCOPY    . HERNIA REPAIR    . LOOP RECORDER INSERTION N/A 05/24/2019   Procedure: LOOP RECORDER INSERTION;  Surgeon: Constance Haw, MD;  Location: [facility];  Service: Cardiovascular;  Laterality: N/A;  . TEE WITHOUT CARDIOVERSION N/A 05/24/2019   Procedure: TRANSESOPHAGEAL ECHOCARDIOGRAM (TEE);  Surgeon: Donato Heinz, MD;  Location: [facility] ENDOSCOPY;  Service: Endoscopy;  Laterality: N/A;  . VENTRAL HERNIA REPAIR  03/07/2012   Procedure: LAPAROSCOPIC VENTRAL HERNIA;  Surgeon: Gwenyth Ober, MD;  Location: [facility];  Service: General;  Laterality: N/A;    Social History:  Social History   Socioeconomic History  . Marital status: Married    Spouse name: Not on file  . Number of children: Not on file  . Years of education: Not on file  . Highest education level: Not on file  Occupational History  . Occupation: retired  Scientific laboratory technician  . Financial resource strain: Not hard at all  . Food insecurity    Worry: Never true    Inability: Never true  . Transportation needs    Medical: No    Non-medical: No  Tobacco Use  . Smoking status: Never Smoker  .  Smokeless tobacco: Never Used  Substance and Sexual Activity  . Alcohol use: No    Alcohol/week: 1.0 standard drinks    Types: 1 Cans of beer per week    Comment: h/o heavy use, quit in 2017  . Drug use: No  . Sexual activity: Not Currently  Lifestyle  . Physical activity    Days per week: 0 days    Minutes per session: 0 min  . Stress: Not at all  Relationships  . Social Herbalist on phone: Not on file    Gets together: Not on file    Attends religious service: Not on file    Active member of club or organization: Not on file    Attends meetings of clubs or organizations: Not on file    Relationship status: Not on file  . Intimate partner violence    Fear of current or ex partner: No  Emotionally abused: No    Physically abused: No    Forced sexual activity: No  Other Topics Concern  . Not on file  Social History Narrative  . Not on file    Family History:  Family History  Problem Relation Age of Onset  . CVA Mother   . Hypertension Brother   . Hypertension Sister   . Hypertension Brother   . Stroke Sister   . Heart attack Neg Hx     Medications:   Current Outpatient Medications on File Prior to Visit  Medication Sig Dispense Refill  . atorvastatin (LIPITOR) 80 MG tablet Take 1 tablet (80 mg total) by mouth daily at 6 PM. 30 tablet 3  . clopidogrel (PLAVIX) 75 MG tablet Take 1 tablet (75 mg total) by mouth daily. 30 tablet 2   No current facility-administered medications on file prior to visit.     Allergies:  No Known Allergies   Physical Exam  Vitals:   07/05/19 1304  BP: (!) 154/72  Pulse: 72  Temp: 97.6 F (36.4 C)  TempSrc: Oral  Weight: 141 lb 9.6 oz (64.2 kg)  Height: 5\' 7"  (1.702 m)   Body mass index is 22.18 kg/m. No exam data present  Depression screen [facility] 2/9 07/05/2019  Decreased Interest 0  Down, Depressed, Hopeless 0  PHQ - 2 Score 0  Altered sleeping -  Tired, decreased energy -  Change in appetite -  Feeling  bad or failure about yourself  -  Trouble concentrating -  Moving slowly or fidgety/restless -  Suicidal thoughts -  PHQ-9 Score -     General: well developed, well nourished,  pleasant elderly African-American male, seated, in no evident distress Head: head normocephalic and atraumatic.   Neck: supple with no carotid or supraclavicular bruits Cardiovascular: regular rate and rhythm, no murmurs Musculoskeletal: no deformity Skin:  no rash/petichiae Vascular:  Normal pulses all extremities   Neurologic Exam Mental Status: Awake and fully alert.   Mild dysarthria but per sister, patient's baseline.  Oriented to place and time. Recent and remote memory intact. Attention span, concentration and fund of knowledge appropriate. Mood and affect appropriate.  Cranial Nerves: Fundoscopic exam reveals sharp disc margins.  Right eye pupil equal, briskly reactive to light.  Chronic blindness left eye.  Extraocular movements full without nystagmus. Visual fields full to confrontation. Hearing intact to voice. Facial sensation intact. Face, tongue, palate moves normally and symmetrically.  Motor: Normal bulk and tone. Normal strength in all tested extremity muscles. Sensory.: intact to touch , pinprick , position and vibratory sensation.  Coordination: Rapid alternating movements normal in all extremities. Finger-to-nose and heel-to-shin performed accurately bilaterally. Gait and Station: Arises from chair without difficulty. Stance is normal. Gait demonstrates normal slow cautious steps with assistance of cane Reflexes: 1+ and symmetric. Toes downgoing.     NIHSS  0 Modified Rankin  0    Diagnostic Data (Labs, Imaging, Testing)  Ct Angio Head W Or Wo Contrast Ct Angio Neck W Or Wo Contrast 05/20/2019 IMPRESSION:  1. No major intracranial arterial occlusion or significant proximal stenosis.  2. Cervical carotid artery atherosclerosis without significant stenosis.  3. Small bilateral pleural  effusions.  Ct Head Wo Contrast 05/20/2019 IMPRESSION:  1. No acute intracranial findings.  2. Atrophy and white matter microvascular disease.   Mr Brain Wo Contrast 05/20/2019 IMPRESSION:  1. Severely motion degraded, incomplete examination.  2. Small acute posterior right frontal lobe infarct.     Transthoracic Echocardiogram  05/20/2019 Impression  1. Left ventricular ejection fraction, by visual estimation, is 55 to 60%. The left ventricle has normal function. Normal left ventricular size. There is severely increased left ventricular hypertrophy.  2. Left ventricular diastolic Doppler parameters are consistent with impaired relaxation pattern of LV diastolic filling.  3. Global right ventricle has normal systolic function.The right ventricular size is normal. No increase in right ventricular wall thickness.  4. Left atrial size was normal.  5. Right atrial size was normal.  6. The mitral valve is normal in structure. Mild mitral valve regurgitation. No evidence of mitral stenosis.  7. The tricuspid valve is normal in structure. Tricuspid valve regurgitation was not visualized by color flow Doppler.  8. The aortic valve is normal in structure. Aortic valve regurgitation is mild by color flow Doppler. Mild to moderate aortic valve sclerosis/calcification without any evidence of aortic stenosis.  9. The pulmonic valve was normal in structure. Pulmonic valve regurgitation is not visualized by color flow Doppler. 10. The inferior vena cava is normal in size with greater than 50% respiratory variability, suggesting right atrial pressure of 3 mmHg.   ECG - SB rate 57 BPM. (See cardiology reading for complete details)   ASSESSMENT: MCKINNON GLICK is a 84 y.o. year old male presented with 2-day history of left arm weakness, facial droop and slurred speech on 05/20/2019 with stroke work-up showing acute posterior right frontal lobe infarct embolic pattern secondary to unknown source.   Loop recorder placed for monitoring of atrial fibrillation.  Vascular risk factors include HTN, HLD, CAD, CHF, prior strokes.  Loop recorder has not shown atrial fibrillation thus far.  Denies residual deficits from recent stroke    PLAN:  1. Cryptogenic stroke: Continue clopidogrel 75 mg daily  and atorvastatin for secondary stroke prevention. Maintain strict control of hypertension with blood pressure goal below 130/90, diabetes with hemoglobin A1c goal below 6.5% and cholesterol with LDL cholesterol (bad cholesterol) goal below 70 mg/dL.  I also advised the patient to eat a healthy diet with plenty of whole grains, cereals, fruits and vegetables, exercise regularly with at least 30 minutes of continuous activity daily and maintain ideal body weight.  Continue to monitor loop recorder for potential atrial fibrillation. 2. HTN: Prior antihypertensives discontinued due to symptomatic bradycardia.  Blood pressure today slightly elevated but advised to monitor at home for potential need of restarting antihypertensive in the future.  Advised to follow with cardiology/PCP for ongoing monitoring and management 3. HLD: Advised to continue current treatment regimen along with continued follow-up with PCP for future prescribing and monitoring of lipid panel 4. CAD, CHF: Ongoing follow-up with cardiology as scheduled 5. Discussion regarding potential Remi Haggard participation.  Sister and patient interested in study.  Research team unavailable during visit and will provide patient information to research team for them to contact sister to provide her additional information for possible participation    Follow up in 4 months or call earlier if needed   Greater than 50% of time during this 45 minute visit was spent on counseling, explanation of diagnosis of cryptogenic stroke, reviewing risk factor management of HTN and HLD, planning of further management along with potential future management, and discussion with  patient and family answering all questions.    Frann Rider, AGNP-BC  [facility] [address] [place], [place] [address]  Phone [phone] Fax [phone] Note: This document was prepared with digital dictation and possible smart phrase technology. Any transcriptional errors that result from this  process are unintentional.

## 2019-07-18 ENCOUNTER — Ambulatory Visit (INDEPENDENT_AMBULATORY_CARE_PROVIDER_SITE_OTHER): Payer: Medicare Other | Admitting: Podiatry

## 2019-07-18 ENCOUNTER — Other Ambulatory Visit: Payer: Self-pay

## 2019-07-18 ENCOUNTER — Encounter: Payer: Self-pay | Admitting: Podiatry

## 2019-07-18 DIAGNOSIS — M79674 Pain in right toe(s): Secondary | ICD-10-CM

## 2019-07-18 DIAGNOSIS — M79675 Pain in left toe(s): Secondary | ICD-10-CM

## 2019-07-18 DIAGNOSIS — I739 Peripheral vascular disease, unspecified: Secondary | ICD-10-CM

## 2019-07-18 DIAGNOSIS — B351 Tinea unguium: Secondary | ICD-10-CM | POA: Diagnosis not present

## 2019-07-18 NOTE — Progress Notes (Signed)
Complaint:  Visit Type: Patient returns to my office for continued preventative foot care services. Complaint: Patient states" my nails have grown long and thick and become painful to walk and wear shoes" . The patient presents for preventative foot care services. No changes to ROS.  Patient is accompanied by his assistant.  Podiatric Exam: Vascular: dorsalis pedis and posterior tibial pulses are weakly  palpable bilateral. Capillary return is immediate. Temperature gradient is diminished. Skin turgor WNL  Sensorium: Normal Semmes Weinstein monofilament test. Normal tactile sensation bilaterally. Nail Exam: Pt has thick disfigured discolored nails with subungual debris noted bilateral entire nail hallux through fifth toenails Ulcer Exam: There is no evidence of ulcer or pre-ulcerative changes or infection. Orthopedic Exam: Muscle tone and strength are WNL. No limitations in general ROM. No crepitus or effusions noted. Foot type and digits show no abnormalities. HAV  B/L. Skin: No Porokeratosis. No infection or ulcers  Diagnosis:  Onychomycosis, , Pain in right toe, pain in left toes  Treatment & Plan Procedures and Treatment: Consent by patient was obtained for treatment procedures. The patient understood the discussion of treatment and procedures well. All questions were answered thoroughly reviewed. Debridement of mycotic and hypertrophic toenails, 1 through 5 bilateral and clearing of subungual debris.  Return Visit-Office Procedure: Patient instructed to return to the office for a follow up visit 9 weeks  for continued evaluation and treatment.    Stayce Delancy DPM 

## 2019-07-25 NOTE — Progress Notes (Signed)
Carelink Summary Report / Loop Recorder 

## 2019-07-26 ENCOUNTER — Ambulatory Visit: Payer: Medicare Other

## 2019-07-26 ENCOUNTER — Ambulatory Visit: Payer: Medicare Other | Admitting: Nurse Practitioner

## 2019-07-27 ENCOUNTER — Ambulatory Visit: Payer: Medicare Other | Admitting: Nurse Practitioner

## 2019-07-27 ENCOUNTER — Ambulatory Visit: Payer: Medicare Other

## 2019-07-31 ENCOUNTER — Ambulatory Visit (INDEPENDENT_AMBULATORY_CARE_PROVIDER_SITE_OTHER): Payer: Medicare Other | Admitting: *Deleted

## 2019-07-31 DIAGNOSIS — I5032 Chronic diastolic (congestive) heart failure: Secondary | ICD-10-CM

## 2019-07-31 LAB — CUP PACEART REMOTE DEVICE CHECK
Date Time Interrogation Session: 20201220131434
Implantable Pulse Generator Implant Date: 20201014

## 2019-08-08 ENCOUNTER — Encounter: Payer: Self-pay | Admitting: Nurse Practitioner

## 2019-08-08 ENCOUNTER — Ambulatory Visit (INDEPENDENT_AMBULATORY_CARE_PROVIDER_SITE_OTHER): Payer: Medicare Other

## 2019-08-08 ENCOUNTER — Ambulatory Visit (INDEPENDENT_AMBULATORY_CARE_PROVIDER_SITE_OTHER): Payer: Medicare Other | Admitting: Nurse Practitioner

## 2019-08-08 ENCOUNTER — Other Ambulatory Visit: Payer: Self-pay

## 2019-08-08 VITALS — BP 120/78 | HR 61 | Temp 97.6°F | Ht 67.0 in | Wt 143.0 lb

## 2019-08-08 VITALS — BP 120/78 | HR 61 | Temp 97.6°F | Ht 67.0 in | Wt 143.2 lb

## 2019-08-08 DIAGNOSIS — I129 Hypertensive chronic kidney disease with stage 1 through stage 4 chronic kidney disease, or unspecified chronic kidney disease: Secondary | ICD-10-CM

## 2019-08-08 DIAGNOSIS — I10 Essential (primary) hypertension: Secondary | ICD-10-CM

## 2019-08-08 DIAGNOSIS — Z8673 Personal history of transient ischemic attack (TIA), and cerebral infarction without residual deficits: Secondary | ICD-10-CM | POA: Diagnosis not present

## 2019-08-08 DIAGNOSIS — I639 Cerebral infarction, unspecified: Secondary | ICD-10-CM

## 2019-08-08 DIAGNOSIS — R7303 Prediabetes: Secondary | ICD-10-CM

## 2019-08-08 DIAGNOSIS — N183 Chronic kidney disease, stage 3 unspecified: Secondary | ICD-10-CM | POA: Diagnosis not present

## 2019-08-08 DIAGNOSIS — Z0000 Encounter for general adult medical examination without abnormal findings: Secondary | ICD-10-CM | POA: Diagnosis not present

## 2019-08-08 LAB — POCT UA - MICROALBUMIN
Creatinine, POC: 200 mg/dL
Microalbumin Ur, POC: 150 mg/L

## 2019-08-08 NOTE — Progress Notes (Signed)
This visit occurred during the SARS-CoV-2 public health emergency.  Safety protocols were in place, including screening questions prior to the visit, additional usage of staff PPE, and extensive cleaning of exam room while observing appropriate contact time as indicated for disinfecting solutions.  Subjective:     Patient ID: Trevor Murphy , male    DOB: June 14, 1935 , 84 y.o.   MRN: [id number]   Chief Complaint  Patient presents with  . Hypertension    HPI  Wt Readings from Last 3 Encounters: 08/08/19 : 143 lb (64.9 kg) 08/08/19 : 143 lb 3.2 oz (65 kg) 07/05/19 : 141 lb 9.6 oz (64.2 kg)    Hypertension This is a chronic problem. The current episode started more than 1 year ago. The problem is controlled. Pertinent negatives include no anxiety, blurred vision, chest pain, headaches, palpitations or peripheral edema. There are no associated agents to hypertension. Risk factors for coronary artery disease include male gender. There are no compliance problems.   Diabetes He presents for his follow-up diabetic visit. Diabetes type: prediabetes. His disease course has been stable. Pertinent negatives for hypoglycemia include no dizziness or headaches. Pertinent negatives for diabetes include no blurred vision and no chest pain. Compliance with diabetes treatment: no medications, diet controlled.     Past Medical History:  Diagnosis Date  . Arthritis   . CAD (coronary artery disease)    a. Cath 08/2009: minimal nonobstructive disease (25% LAD). b. Lexiscan nuc 6/215: EF 54%, no ischemia or infarction.  . Chronic diastolic CHF (congestive heart failure) ([place])    a. Echo 2013: EF 55-60%, mild AI, mild MR. b. Echo 5/15 with EF 60-65%, no WMA, mild LVH, mild AI.  [name] Kitchen CKD (chronic kidney disease) stage 3, GFR 30-59 ml/min 07/26/2012   a. Felt likely due to HTN.  . CVA (cerebral infarction) 07/28/2012  . Glaucoma   . Gout   . H/O umbilical hernia repair   . Headache(784.0)    " WHEN MY BLOOD  PRESSURE IS UP"  . History of acute renal failure   . History of alcohol abuse   . HTN (hypertension)   . Hypertension    a. Patent renal arteries 2005.  [name] Kitchen Noncompliance   . OA (osteoarthritis)   . Other and unspecified hyperlipidemia   . Poor circulation   . Sinus bradycardia   . SVT (supraventricular tachycardia) ([place])    a. Possible AVNRT.  Got adenosine from EMS in 2015 for SVT.  During 5/15 hospitalization had episode of short R-P tachycardia (possible AVNRT) terminated by adenosine.  b. 04/2016 - recurrent tachycardia (SVT vs. AVNRT), again terminated by Adenosine  . Visual impairment      Family History  Problem Relation Age of Onset  . CVA Mother   . Hypertension Brother   . Hypertension Sister   . Hypertension Brother   . Stroke Sister   . Heart attack Neg Hx      Current Outpatient Medications:  .  atorvastatin (LIPITOR) 80 MG tablet, Take 1 tablet (80 mg total) by mouth daily at 6 PM., Disp: 30 tablet, Rfl: 3 .  clopidogrel (PLAVIX) 75 MG tablet, Take 1 tablet (75 mg total) by mouth daily., Disp: 30 tablet, Rfl: 2   No Known Allergies   Review of Systems  Constitutional: Negative.  Negative for chills.  Eyes: Negative for blurred vision.  Respiratory: Negative.   Cardiovascular: Negative.  Negative for chest pain, palpitations and leg swelling.  Musculoskeletal: Negative.   Skin: Negative.  Neurological: Negative for dizziness and headaches.  Psychiatric/Behavioral: Negative.      Today's Vitals   08/08/19 1045  BP: 120/78  Pulse: 61  Temp: 97.6 F (36.4 C)  TempSrc: Oral  Weight: 143 lb (64.9 kg)  Height: _0  (1.702 m)  PainSc: 0-No pain   Body mass index is 22.4 kg/m.   Objective:  Physical Exam Constitutional:      Appearance: Normal appearance.  Cardiovascular:     Rate and Rhythm: Normal rate and regular rhythm.     Pulses: Normal pulses.     Heart sounds: Normal heart sounds. No murmur.  Pulmonary:     Effort: Pulmonary effort is  normal. No respiratory distress.     Breath sounds: Normal breath sounds.  Skin:    General: Skin is warm and dry.     Capillary Refill: Capillary refill takes less than 2 seconds.     Coloration: Skin is not jaundiced.  Neurological:     General: No focal deficit present.     Mental Status: He is alert and oriented to person, place, and time.         Assessment And Plan:     1. Essential hypertension . B/P is controlled.  . CMP ordered to check renal function.  . The importance of regular exercise and dietary modification was stressed to the patient.  [name] Kitchen He is not on any antihypertensive medication due to symptomatic bradycardia.  - BMP8+eGFR  2. Prediabetes  Chronic, normal at last visit HgbA1c.  Diabetic foot exam done with decreased sensation and 1+ pedal pulses bilaterally  He is scheduled for an eye exam at the beginning of the year.  - Hemoglobin A1c  3. Stage 3 chronic kidney disease, unspecified whether stage 3a or 3b CKD  Chronic  Advised to drink adequate amounts of water - BMP8+eGFR  4. Cerebral infarction, unspecified mechanism ([place])  He has been seen by Neurology, has a loop recorder and TEE were done  He is to continue to take plavix daily   [name], FNP    THE PATIENT IS ENCOURAGED TO PRACTICE SOCIAL DISTANCING DUE TO THE COVID-19 PANDEMIC.

## 2019-08-08 NOTE — Progress Notes (Signed)
This visit occurred during the SARS-CoV-2 public health emergency.  Safety protocols were in place, including screening questions prior to the visit, additional usage of staff PPE, and extensive cleaning of exam room while observing appropriate contact time as indicated for disinfecting solutions.  Subjective:   Trevor Murphy is a 84 y.o. male who presents for Medicare Annual/Subsequent preventive examination.  Review of Systems:  n/a Cardiac Risk Factors include: advanced age (>72men, >55 women);hypertension;dyslipidemia;male gender;sedentary lifestyle     Objective:    Vitals: BP 120/78 (BP Location: Left Arm, Patient Position: Sitting, Cuff Size: Normal)   Pulse 61   Temp 97.6 F (36.4 C) (Oral)   Ht 5\' 7"  (1.702 m)   Wt 143 lb 3.2 oz (65 kg)   SpO2 99%   BMI 22.43 kg/m   Body mass index is 22.43 kg/m.  Advanced Directives 08/08/2019 05/24/2019 07/22/2018 05/03/2016 04/14/2016 02/04/2015 07/10/2014  Does Patient Have a Medical Advance Directive? Yes No No Yes No No No  Type of Advance Directive [place] in Chart? No - copy requested - - - - - -  Would patient like information on creating a medical advance directive? - - No - Patient declined - No - patient declined information - -  Pre-existing out of facility DNR order (yellow form or pink MOST form) - - - - - - -    Tobacco Social History   Tobacco Use  Smoking Status Never Smoker  Smokeless Tobacco Never Used     Counseling given: Not Answered   Clinical Intake:  Pre-visit preparation completed: Yes  Pain : No/denies pain     Nutritional Status: BMI of 19-24  Normal Nutritional Risks: None Diabetes: No  How often do you need to have someone help you when you read instructions, pamphlets, or other written materials from your doctor or pharmacy?: 1 - Never What is the last grade level you completed in school?: 11th grade  Interpreter Needed?:  No  Information entered by :: NAllen LPN  Past Medical History:  Diagnosis Date  . Arthritis   . CAD (coronary artery disease)    a. Cath 08/2009: minimal nonobstructive disease (25% LAD). b. Lexiscan nuc 6/215: EF 54%, no ischemia or infarction.  . Chronic diastolic CHF (congestive heart failure) ([place])    a. Echo 2013: EF 55-60%, mild AI, mild MR. b. Echo 5/15 with EF 60-65%, no WMA, mild LVH, mild AI.  [name] Kitchen CKD (chronic kidney disease) stage 3, GFR 30-59 ml/min 07/26/2012   a. Felt likely due to HTN.  . CVA (cerebral infarction) 07/28/2012  . Glaucoma   . Gout   . H/O umbilical hernia repair   . Headache(784.0)    " WHEN MY BLOOD PRESSURE IS UP"  . History of acute renal failure   . History of alcohol abuse   . HTN (hypertension)   . Hypertension    a. Patent renal arteries 2005.  [name] Kitchen Noncompliance   . OA (osteoarthritis)   . Other and unspecified hyperlipidemia   . Poor circulation   . Sinus bradycardia   . SVT (supraventricular tachycardia) ([place])    a. Possible AVNRT.  Got adenosine from EMS in 2015 for SVT.  During 5/15 hospitalization had episode of short R-P tachycardia (possible AVNRT) terminated by adenosine.  b. 04/2016 - recurrent tachycardia (SVT vs. AVNRT), again terminated by Adenosine  . Visual impairment    Past Surgical History:  Procedure Laterality Date  . APPENDECTOMY    . BUBBLE STUDY  05/24/2019   Procedure: BUBBLE STUDY;  Surgeon: Donato Heinz, MD;  Location: [place];  Service: Endoscopy;;  . CARDIAC CATHETERIZATION  11/2003   EF 50-55%  . CARDIAC CATHETERIZATION  08/2009  . CATARACT EXTRACTION EXTRACAPSULAR Right 05/17/2013   Procedure: CATARACT EXTRACTION EXTRACAPSULAR WITH INTRAOCULAR LENS PLACEMENT (IOC) RIGHT EYE;  Surgeon: Marylynn Pearson, MD;  Location: [place];  Service: Ophthalmology;  Laterality: Right;  . COLONOSCOPY    . HERNIA REPAIR    . LOOP RECORDER INSERTION N/A 05/24/2019   Procedure: LOOP RECORDER INSERTION;  Surgeon: Constance Haw, MD;  Location: [place] [facility];  Service: Cardiovascular;  Laterality: N/A;  . TEE WITHOUT CARDIOVERSION N/A 05/24/2019   Procedure: TRANSESOPHAGEAL ECHOCARDIOGRAM (TEE);  Surgeon: Donato Heinz, MD;  Location: [facility];  Service: Endoscopy;  Laterality: N/A;  . VENTRAL HERNIA REPAIR  03/07/2012   Procedure: LAPAROSCOPIC VENTRAL HERNIA;  Surgeon: Gwenyth Ober, MD;  Location: [place];  Service: General;  Laterality: N/A;   Family History  Problem Relation Age of Onset  . CVA Mother   . Hypertension Brother   . Hypertension Sister   . Hypertension Brother   . Stroke Sister   . Heart attack Neg Hx    Social History   Socioeconomic History  . Marital status: Married    Spouse name: Not on file  . Number of children: Not on file  . Years of education: Not on file  . Highest education level: Not on file  Occupational History  . Occupation: retired  Tobacco Use  . Smoking status: Never Smoker  . Smokeless tobacco: Never Used  Substance and Sexual Activity  . Alcohol use: No    Alcohol/week: 1.0 standard drinks    Types: 1 Cans of beer per week    Comment: h/o heavy use, quit in 2017  . Drug use: No  . Sexual activity: Not Currently  Other Topics Concern  . Not on file  Social History Narrative  . Not on file   Social Determinants of Health   Financial Resource Strain: Low Risk   . Difficulty of Paying Living Expenses: Not hard at all  Food Insecurity: No Food Insecurity  . Worried About Charity fundraiser in the Last Year: Never true  . Ran Out of Food in the Last Year: Never true  Transportation Needs: No Transportation Needs  . Lack of Transportation (Medical): No  . Lack of Transportation (Non-Medical): No  Physical Activity: Inactive  . Days of Exercise per Week: 0 days  . Minutes of Exercise per Session: 0 min  Stress: No Stress Concern Present  . Feeling of Stress : Not at all  Social Connections:   . Frequency of Communication with  Friends and Family: Not on file  . Frequency of Social Gatherings with Friends and Family: Not on file  . Attends Religious Services: Not on file  . Active Member of Clubs or Organizations: Not on file  . Attends Archivist Meetings: Not on file  . Marital Status: Not on file    Outpatient Encounter Medications as of 08/08/2019  Medication Sig  . atorvastatin (LIPITOR) 80 MG tablet Take 1 tablet (80 mg total) by mouth daily at 6 PM.  . clopidogrel (PLAVIX) 75 MG tablet Take 1 tablet (75 mg total) by mouth daily.   No facility-administered encounter medications on file as of 08/08/2019.  Activities of Daily Living In your present state of health, do you have any difficulty performing the following activities: 08/08/2019  Hearing? Y  Comment trouble hearing on the phone  Vision? 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 N  Walking or climbing stairs? Y  Comment uses cane  Dressing or bathing? Y  Comment has an Tourist information centre manager errands, shopping? Y  Preparing Food and eating ? Y  Using the Toilet? N  In the past six months, have you accidently leaked urine? Y  Do you have problems with loss of bowel control? N  Managing your Medications? N  Managing your Finances? Y  Comment son and sister  Housekeeping or managing your Housekeeping? Y  Comment has an aide  Some recent data might be hidden    Patient Care Team: [name], FNP as PCP - General (General Practice)   Assessment:   This is a routine wellness examination for [place].  Exercise Activities and Dietary recommendations Current Exercise Habits: The patient does not participate in regular exercise at present  Goals    . Patient Stated     08/08/2019, no goals        Fall Risk Fall Risk  08/08/2019 11/21/2018 07/22/2018  Falls in the past year? 0 0 1  Number falls in past yr: - - 0  Comment - - lost balance  Injury with Fall? - - 1    Comment - - scrapes  Risk for fall due to : Impaired balance/gait;Impaired mobility - History of fall(s);Impaired mobility;Medication side effect;Impaired balance/gait;Impaired vision  Follow up Education provided;Falls prevention discussed - Falls prevention discussed   Is the patient's home free of loose throw rugs in walkways, pet beds, electrical cords, etc?   yes      Grab bars in the bathroom? yes      Handrails on the stairs?   n/a      Adequate lighting?   yes  Timed Get Up and Go Performed: n/a  Depression Screen PHQ 2/9 Scores 08/08/2019 07/05/2019 11/21/2018 07/22/2018  PHQ - 2 Score 0 0 0 0  PHQ- 9 Score 0 - - 0    Cognitive Function     6CIT Screen 08/08/2019 07/22/2018  What Year? 0 points 4 points  What month? 0 points 0 points  What time? 3 points 0 points  Count back from 20 0 points 0 points  Months in reverse 4 points 2 points  Repeat phrase 10 points 0 points  Total Score 17 6    There is no immunization history for the selected administration types on file for this patient.  Qualifies for Shingles Vaccine? yes  Screening Tests Health Maintenance  Topic Date Due  . OPHTHALMOLOGY EXAM  04/29/2019  . HEMOGLOBIN A1C  05/23/2019  . FOOT EXAM  07/23/2019  . INFLUENZA VACCINE  11/08/2019 (Originally 03/11/2019)  . PNA vac Low Risk Adult (1 of 2 - PCV13) 08/07/2020 (Originally 03/21/2002)  . URINE MICROALBUMIN  08/07/2020  . TETANUS/TDAP  06/22/2023   Cancer Screenings: Lung: Low Dose CT Chest recommended if Age 51-80 years, 30 pack-year currently smoking OR have quit w/in 15years. Patient does not qualify. Colorectal: not required  Additional Screenings:  Hepatitis C Screening:n/a      Plan:   Patient has no goals set at this time.  I have personally reviewed and noted the following in the patient's chart:   . Medical and social history .  Use of alcohol, tobacco or illicit drugs  . Current medications and supplements . Functional ability and  status . Nutritional status . Physical activity . Advanced directives . List of other physicians . Hospitalizations, surgeries, and ER visits in previous 12 months . Vitals . Screenings to include cognitive, depression, and falls . Referrals and appointments  In addition, I have reviewed and discussed with patient certain preventive protocols, quality metrics, and best practice recommendations. A written personalized care plan for preventive services as well as general preventive health recommendations were provided to patient.     Kellie Simmering, LPN  [id number]

## 2019-08-08 NOTE — Patient Instructions (Signed)
Trevor Murphy , Thank you for taking time to come for your Medicare Wellness Visit. I appreciate your ongoing commitment to your health goals. Please review the following plan we discussed and let me know if I can assist you in the future.   Screening recommendations/referrals: Colonoscopy: not required Recommended yearly ophthalmology/optometry visit for glaucoma screening and checkup Recommended yearly dental visit for hygiene and checkup  Vaccinations: Influenza vaccine: declines Pneumococcal vaccine: declines Tdap vaccine: 06/2013 Shingles vaccine: discussed    Advanced directives: Please bring a copy of your POA (Power of Attorney) and/or Living Will to your next appointment.    Conditions/risks identified: HTN  Next appointment: 08/08/20 at 11:00  Preventive Care 44 Years and Older, Male Preventive care refers to lifestyle choices and visits with your health care provider that can promote health and wellness. What does preventive care include?  A yearly physical exam. This is also called an annual well check.  Dental exams once or twice a year.  Routine eye exams. Ask your health care provider how often you should have your eyes checked.  Personal lifestyle choices, including:  Daily care of your teeth and gums.  Regular physical activity.  Eating a healthy diet.  Avoiding tobacco and drug use.  Limiting alcohol use.  Practicing safe sex.  Taking low doses of aspirin every day.  Taking vitamin and mineral supplements as recommended by your health care provider. What happens during an annual well check? The services and screenings done by your health care provider during your annual well check will depend on your age, overall health, lifestyle risk factors, and family history of disease. Counseling  Your health care provider may ask you questions about your:  Alcohol use.  Tobacco use.  Drug use.  Emotional well-being.  Home and relationship  well-being.  Sexual activity.  Eating habits.  History of falls.  Memory and ability to understand (cognition).  Work and work Statistician. Screening  You may have the following tests or measurements:  Height, weight, and BMI.  Blood pressure.  Lipid and cholesterol levels. These may be checked every 5 years, or more frequently if you are over 19 years old.  Skin check.  Lung cancer screening. You may have this screening every year starting at age 25 if you have a 30-pack-year history of smoking and currently smoke or have quit within the past 15 years.  Fecal occult blood test (FOBT) of the stool. You may have this test every year starting at age 94.  Flexible sigmoidoscopy or colonoscopy. You may have a sigmoidoscopy every 5 years or a colonoscopy every 10 years starting at age 46.  Prostate cancer screening. Recommendations will vary depending on your family history and other risks.  Hepatitis C blood test.  Hepatitis B blood test.  Sexually transmitted disease (STD) testing.  Diabetes screening. This is done by checking your blood sugar (glucose) after you have not eaten for a while (fasting). You may have this done every 1-3 years.  Abdominal aortic aneurysm (AAA) screening. You may need this if you are a current or former smoker.  Osteoporosis. You may be screened starting at age 19 if you are at high risk. Talk with your health care provider about your test results, treatment options, and if necessary, the need for more tests. Vaccines  Your health care provider may recommend certain vaccines, such as:  Influenza vaccine. This is recommended every year.  Tetanus, diphtheria, and acellular pertussis (Tdap, Td) vaccine. You may need a Td booster  every 10 years.  Zoster vaccine. You may need this after age 18.  Pneumococcal 13-valent conjugate (PCV13) vaccine. One dose is recommended after age 67.  Pneumococcal polysaccharide (PPSV23) vaccine. One dose is  recommended after age 15. Talk to your health care provider about which screenings and vaccines you need and how often you need them. This information is not intended to replace advice given to you by your health care provider. Make sure you discuss any questions you have with your health care provider. Document Released: 08/23/2015 Document Revised: 04/15/2016 Document Reviewed: 05/28/2015 Elsevier Interactive Patient Education  2017 [place] Prevention in the Home Falls can cause injuries. They can happen to people of all ages. There are many things you can do to make your home safe and to help prevent falls. What can I do on the outside of my home?  Regularly fix the edges of walkways and driveways and fix any cracks.  Remove anything that might make you trip as you walk through a door, such as a raised step or threshold.  Trim any bushes or trees on the path to your home.  Use bright outdoor lighting.  Clear any walking paths of anything that might make someone trip, such as rocks or tools.  Regularly check to see if handrails are loose or broken. Make sure that both sides of any steps have handrails.  Any raised decks and porches should have guardrails on the edges.  Have any leaves, snow, or ice cleared regularly.  Use sand or salt on walking paths during winter.  Clean up any spills in your garage right away. This includes oil or grease spills. What can I do in the bathroom?  Use night lights.  Install grab bars by the toilet and in the tub and shower. Do not use towel bars as grab bars.  Use non-skid mats or decals in the tub or shower.  If you need to sit down in the shower, use a plastic, non-slip stool.  Keep the floor dry. Clean up any water that spills on the floor as soon as it happens.  Remove soap buildup in the tub or shower regularly.  Attach bath mats securely with double-sided non-slip rug tape.  Do not have throw rugs and other things on  the floor that can make you trip. What can I do in the bedroom?  Use night lights.  Make sure that you have a light by your bed that is easy to reach.  Do not use any sheets or blankets that are too big for your bed. They should not hang down onto the floor.  Have a firm chair that has side arms. You can use this for support while you get dressed.  Do not have throw rugs and other things on the floor that can make you trip. What can I do in the kitchen?  Clean up any spills right away.  Avoid walking on wet floors.  Keep items that you use a lot in easy-to-reach places.  If you need to reach something above you, use a strong step stool that has a grab bar.  Keep electrical cords out of the way.  Do not use floor polish or wax that makes floors slippery. If you must use wax, use non-skid floor wax.  Do not have throw rugs and other things on the floor that can make you trip. What can I do with my stairs?  Do not leave any items on the stairs.  Make  sure that there are handrails on both sides of the stairs and use them. Fix handrails that are broken or loose. Make sure that handrails are as long as the stairways.  Check any carpeting to make sure that it is firmly attached to the stairs. Fix any carpet that is loose or worn.  Avoid having throw rugs at the top or bottom of the stairs. If you do have throw rugs, attach them to the floor with carpet tape.  Make sure that you have a light switch at the top of the stairs and the bottom of the stairs. If you do not have them, ask someone to add them for you. What else can I do to help prevent falls?  Wear shoes that:  Do not have high heels.  Have rubber bottoms.  Are comfortable and fit you well.  Are closed at the toe. Do not wear sandals.  If you use a stepladder:  Make sure that it is fully opened. Do not climb a closed stepladder.  Make sure that both sides of the stepladder are locked into place.  Ask someone to  hold it for you, if possible.  Clearly mark and make sure that you can see:  Any grab bars or handrails.  First and last steps.  Where the edge of each step is.  Use tools that help you move around (mobility aids) if they are needed. These include:  Canes.  Walkers.  Scooters.  Crutches.  Turn on the lights when you go into a dark area. Replace any light bulbs as soon as they burn out.  Set up your furniture so you have a clear path. Avoid moving your furniture around.  If any of your floors are uneven, fix them.  If there are any pets around you, be aware of where they are.  Review your medicines with your doctor. Some medicines can make you feel dizzy. This can increase your chance of falling. Ask your doctor what other things that you can do to help prevent falls. This information is not intended to replace advice given to you by your health care provider. Make sure you discuss any questions you have with your health care provider. Document Released: 05/23/2009 Document Revised: 01/02/2016 Document Reviewed: 08/31/2014 Elsevier Interactive Patient Education  2017 [employer].

## 2019-08-09 LAB — BMP8+EGFR
BUN/Creatinine Ratio: 10 (ref 10–24)
BUN: 15 mg/dL (ref 8–27)
CO2: 23 mmol/L (ref 20–29)
Calcium: 8.3 mg/dL — ABNORMAL LOW (ref 8.6–10.2)
Chloride: 109 mmol/L — ABNORMAL HIGH (ref 96–106)
Creatinine, Ser: 1.57 mg/dL — ABNORMAL HIGH (ref 0.76–1.27)
GFR calc Af Amer: 47 mL/min/{1.73_m2} — ABNORMAL LOW (ref >59)
GFR calc non Af Amer: 40 mL/min/{1.73_m2} — ABNORMAL LOW (ref >59)
Glucose: 87 mg/dL (ref 65–99)
Potassium: 5.2 mmol/L (ref 3.5–5.2)
Sodium: 142 mmol/L (ref 134–144)

## 2019-08-09 LAB — HEMOGLOBIN A1C
Est. average glucose Bld gHb Est-mCnc: 108 mg/dL
Hgb A1c MFr Bld: 5.4 % (ref 4.8–5.6)

## 2019-08-29 ENCOUNTER — Telehealth: Payer: Self-pay | Admitting: Cardiology

## 2019-08-29 NOTE — Telephone Encounter (Signed)
New Message  Debbe Bales is calling in to get clearance to accompany patient to his appointment with Dr. Curt Bears on 08/31/2019 at 2:00 pm. States that patient is medically unable to come by himself. Please give [name] a call back to confirm at [phone].

## 2019-08-29 NOTE — Telephone Encounter (Signed)
Pts [name] will need to accompany the pt to his OV with Dr. Curt Bears on 08/31/19.  Per the pts sister, he is unable to ambulate without assistance. Informed the pts sister that she may accompany him to this appt, and they will both need to wear their mask to the appt and during the entire duration of the appt. Sister verbalized understanding and agrees with this plan. Updated this in appt notes.

## 2019-08-31 ENCOUNTER — Encounter: Payer: Medicare Other | Admitting: Cardiology

## 2019-08-31 ENCOUNTER — Telehealth: Payer: Self-pay | Admitting: *Deleted

## 2019-08-31 NOTE — Telephone Encounter (Signed)
Sister returned my call. Informed that typically Dr. Curt Bears would not see pt in follow up unless we find abnormality on ILR.  Aware that if anything seen then we would call to discuss and arrange follow up if needed. Sister agrees to this plan and appreciates contacting them so they didn't not waste a trip. appt cancelled.

## 2019-09-01 ENCOUNTER — Telehealth: Payer: Self-pay

## 2019-09-01 NOTE — Telephone Encounter (Signed)
error 

## 2019-09-04 ENCOUNTER — Ambulatory Visit (INDEPENDENT_AMBULATORY_CARE_PROVIDER_SITE_OTHER): Payer: Medicare Other | Admitting: *Deleted

## 2019-09-04 DIAGNOSIS — I5032 Chronic diastolic (congestive) heart failure: Secondary | ICD-10-CM | POA: Diagnosis not present

## 2019-09-04 LAB — CUP PACEART REMOTE DEVICE CHECK
Date Time Interrogation Session: 20210124230124
Implantable Pulse Generator Implant Date: 20201014

## 2019-09-20 ENCOUNTER — Other Ambulatory Visit: Payer: Self-pay

## 2019-09-20 ENCOUNTER — Ambulatory Visit (INDEPENDENT_AMBULATORY_CARE_PROVIDER_SITE_OTHER): Payer: Medicare Other | Admitting: Podiatry

## 2019-09-20 ENCOUNTER — Encounter: Payer: Self-pay | Admitting: Podiatry

## 2019-09-20 DIAGNOSIS — M79674 Pain in right toe(s): Secondary | ICD-10-CM

## 2019-09-20 DIAGNOSIS — B351 Tinea unguium: Secondary | ICD-10-CM | POA: Diagnosis not present

## 2019-09-20 DIAGNOSIS — I739 Peripheral vascular disease, unspecified: Secondary | ICD-10-CM

## 2019-09-20 DIAGNOSIS — M79675 Pain in left toe(s): Secondary | ICD-10-CM | POA: Diagnosis not present

## 2019-09-20 NOTE — Progress Notes (Signed)
Complaint:  Visit Type: Patient returns to my office for continued preventative foot care services. Complaint: Patient states" my nails have grown long and thick and become painful to walk and wear shoes" . The patient presents for preventative foot care services. No changes to ROS.  Patient is accompanied by his assistant.  Podiatric Exam: Vascular: dorsalis pedis and posterior tibial pulses are weakly  palpable bilateral. Capillary return is immediate. Temperature gradient is diminished. Skin turgor WNL  Sensorium: Normal Semmes Weinstein monofilament test. Normal tactile sensation bilaterally. Nail Exam: Pt has thick disfigured discolored nails with subungual debris noted bilateral entire nail hallux through fifth toenails Ulcer Exam: There is no evidence of ulcer or pre-ulcerative changes or infection. Orthopedic Exam: Muscle tone and strength are WNL. No limitations in general ROM. No crepitus or effusions noted. Foot type and digits show no abnormalities. HAV  B/L. Skin: No Porokeratosis. No infection or ulcers  Diagnosis:  Onychomycosis, , Pain in right toe, pain in left toes  Treatment & Plan Procedures and Treatment: Consent by patient was obtained for treatment procedures. The patient understood the discussion of treatment and procedures well. All questions were answered thoroughly reviewed. Debridement of mycotic and hypertrophic toenails, 1 through 5 bilateral and clearing of subungual debris.  Return Visit-Office Procedure: Patient instructed to return to the office for a follow up visit 9 weeks  for continued evaluation and treatment.    Ryin Ambrosius DPM 

## 2019-10-09 ENCOUNTER — Ambulatory Visit (INDEPENDENT_AMBULATORY_CARE_PROVIDER_SITE_OTHER): Payer: Medicare Other | Admitting: *Deleted

## 2019-10-09 DIAGNOSIS — I5032 Chronic diastolic (congestive) heart failure: Secondary | ICD-10-CM

## 2019-10-09 LAB — CUP PACEART REMOTE DEVICE CHECK
Date Time Interrogation Session: 20210228230508
Implantable Pulse Generator Implant Date: 20201014

## 2019-10-10 NOTE — Progress Notes (Signed)
ILR Remote 

## 2019-11-07 ENCOUNTER — Ambulatory Visit (INDEPENDENT_AMBULATORY_CARE_PROVIDER_SITE_OTHER): Payer: Medicare Other | Admitting: Adult Health

## 2019-11-07 ENCOUNTER — Ambulatory Visit (INDEPENDENT_AMBULATORY_CARE_PROVIDER_SITE_OTHER): Payer: Medicare Other | Admitting: Nurse Practitioner

## 2019-11-07 ENCOUNTER — Other Ambulatory Visit: Payer: Self-pay

## 2019-11-07 ENCOUNTER — Encounter: Payer: Self-pay | Admitting: Nurse Practitioner

## 2019-11-07 ENCOUNTER — Encounter: Payer: Self-pay | Admitting: Adult Health

## 2019-11-07 VITALS — BP 118/80 | HR 66 | Temp 97.4°F | Ht 70.5 in | Wt 138.0 lb

## 2019-11-07 VITALS — BP 112/78 | HR 64 | Temp 98.4°F | Ht 67.0 in | Wt 137.4 lb

## 2019-11-07 DIAGNOSIS — R7303 Prediabetes: Secondary | ICD-10-CM | POA: Diagnosis not present

## 2019-11-07 DIAGNOSIS — D649 Anemia, unspecified: Secondary | ICD-10-CM

## 2019-11-07 DIAGNOSIS — I10 Essential (primary) hypertension: Secondary | ICD-10-CM

## 2019-11-07 DIAGNOSIS — I639 Cerebral infarction, unspecified: Secondary | ICD-10-CM | POA: Diagnosis not present

## 2019-11-07 DIAGNOSIS — Z8673 Personal history of transient ischemic attack (TIA), and cerebral infarction without residual deficits: Secondary | ICD-10-CM

## 2019-11-07 DIAGNOSIS — N183 Chronic kidney disease, stage 3 unspecified: Secondary | ICD-10-CM | POA: Diagnosis not present

## 2019-11-07 DIAGNOSIS — I129 Hypertensive chronic kidney disease with stage 1 through stage 4 chronic kidney disease, or unspecified chronic kidney disease: Secondary | ICD-10-CM

## 2019-11-07 DIAGNOSIS — E785 Hyperlipidemia, unspecified: Secondary | ICD-10-CM | POA: Diagnosis not present

## 2019-11-07 DIAGNOSIS — Z95818 Presence of other cardiac implants and grafts: Secondary | ICD-10-CM | POA: Diagnosis not present

## 2019-11-07 NOTE — Patient Instructions (Signed)
Continue clopidogrel 75 mg daily  and atorvastatin 40mg   for secondary stroke prevention  Continue to follow up with PCP regarding cholesterol and blood pressure management   We wil continue to monitor loop recorder for possible atrial fibrillation  Continue to monitor blood pressure at home  Maintain strict control of hypertension with blood pressure goal below 130/90, diabetes with hemoglobin A1c goal below 6.5% and cholesterol with LDL cholesterol (bad cholesterol) goal below 70 mg/dL. I also advised the patient to eat a healthy diet with plenty of whole grains, cereals, fruits and vegetables, exercise regularly and maintain ideal body weight.         Thank you for coming to see [place] at [facility]. I hope we have been able to provide you high quality care today.  You may receive a patient satisfaction survey over the next few weeks. We would appreciate your feedback and comments so that we may continue to improve ourselves and the health of our patients.

## 2019-11-07 NOTE — Progress Notes (Signed)
[facility] [address] [place]. [place] [address] [phone]       STROKE FOLLOW UP NOTE  [name] Date of Birth:  02-Jul-1935 Medical Record Number:  [id number]   Reason for Referral: stroke follow up    CHIEF COMPLAINT:  Chief Complaint  Patient presents with  . Follow-up    follow up, pt states things are well he is with sister. states he has no concerns    HPI:   Today, 11/07/2019, he is accompanied by his sister for stroke follow-up.  He has been doing well from a stroke standpoint without new or reoccuring stroke/TIA symptoms. Continues on plavix and atorvastatin for secondary stroke prvention. Blood pressure today 118/80. Loop recorder has not shown atrial fibrillation thus far.  No concerns at this time.    History: Provided for reference purposes only Stroke admission 05/20/2019: Trevor Murphy is a 85 y.o. 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  Stroke work-up showed acute posterior right frontal lobe infarct as evidenced on MRI embolic pattern secondary to unknown source. He did not receive IV t-PA due to late presentation (>4.5 hours from time of onset).  CTA head/neck negative E LVO or significant stenosis.  2D echo normal EF without cardiac source of embolus identified.  SARS coronavirus 2 negative.  Recommended DAPT for 3 weeks and Plavix alone as previously on aspirin.  HTN stable.  LDL 71 initiated atorvastatin 40 mg daily.  A1c 5.6.  Electrolytes factors include advanced age, history of stroke, family history of stroke, CAD and CHF.  Recommended TEE with possible loop recorder but unable to complete inpatient due to scheduling therefore TEE with loop recorder scheduled on 05/24/2019.  He was  discharged with recommendation of home health therapies on 05/22/2019 in stable condition.  TEE completed on 05/24/2019 which did show small PFO and proceeded with loop recorder placement.  Initial visit 07/05/2019 JM: [name] is an 85 year old male who is being seen today for hospital follow-up accompanied by his sister.  He has recovered well from a stroke standpoint without residual deficits or reoccurring symptoms.  He does live alone but has a caregiver that assists for a couple hrs daily.  His sister also checks on him frequently and assist with medical care.  Loop recorder has not shown atrial fibrillation thus far.  Completed 3 weeks DAPT and continues on Plavix alone without bleeding or bruising.  Continues on atorvastatin 40 mg daily without myalgias.  Blood pressure today 154/72. He does not monitor blood pressure at home.  Sister states antihypertensives recently discontinued by cardiology due to symptomatic bradycardia.  Heart rate today 72 and denies any additional dizziness or bradycardic episodes.  He has follow-up with cardiology next week.  No further concerns at this time.       ROS:   14 system review of systems performed and negative with exception of no complaints  PMH:  Past Medical History:  Diagnosis Date  . Arthritis   . CAD (coronary artery disease)    a. Cath 08/2009: minimal nonobstructive disease (25% LAD). b. Lexiscan nuc 6/215: EF 54%, no ischemia or infarction.  . Chronic diastolic CHF (congestive heart failure) ([place])    a. Echo 2013: EF 55-60%,  mild AI, mild MR. b. Echo 5/15 with EF 60-65%, no WMA, mild LVH, mild AI.  [name] Kitchen CKD (chronic kidney disease) stage 3, GFR 30-59 ml/min 07/26/2012   a. Felt likely due to HTN.  . CVA (cerebral infarction) 07/28/2012  . Glaucoma   . Gout   . H/O umbilical hernia repair   . Headache(784.0)    " WHEN MY BLOOD PRESSURE IS UP"  . History of acute renal failure   . History of alcohol abuse   . HTN (hypertension)   .  Hypertension    a. Patent renal arteries 2005.  [name] Kitchen Noncompliance   . OA (osteoarthritis)   . Other and unspecified hyperlipidemia   . Poor circulation   . Sinus bradycardia   . SVT (supraventricular tachycardia) ([place])    a. Possible AVNRT.  Got adenosine from EMS in 2015 for SVT.  During 5/15 hospitalization had episode of short R-P tachycardia (possible AVNRT) terminated by adenosine.  b. 04/2016 - recurrent tachycardia (SVT vs. AVNRT), again terminated by Adenosine  . Visual impairment     PSH:  Past Surgical History:  Procedure Laterality Date  . APPENDECTOMY    . BUBBLE STUDY  05/24/2019   Procedure: BUBBLE STUDY;  Surgeon: Donato Heinz, MD;  Location: [facility];  Service: Endoscopy;;  . CARDIAC CATHETERIZATION  11/2003   EF 50-55%  . CARDIAC CATHETERIZATION  08/2009  . CATARACT EXTRACTION EXTRACAPSULAR Right 05/17/2013   Procedure: CATARACT EXTRACTION EXTRACAPSULAR WITH INTRAOCULAR LENS PLACEMENT (IOC) RIGHT EYE;  Surgeon: Marylynn Pearson, MD;  Location: [facility];  Service: Ophthalmology;  Laterality: Right;  . COLONOSCOPY    . HERNIA REPAIR    . LOOP RECORDER INSERTION N/A 05/24/2019   Procedure: LOOP RECORDER INSERTION;  Surgeon: Constance Haw, MD;  Location: [facility];  Service: Cardiovascular;  Laterality: N/A;  . TEE WITHOUT CARDIOVERSION N/A 05/24/2019   Procedure: TRANSESOPHAGEAL ECHOCARDIOGRAM (TEE);  Surgeon: Donato Heinz, MD;  Location: [facility] END[facility]COPY;  Service: Endoscopy;  Laterality: N/A;  . VENTRAL HERNIA REPAIR  03/07/2012   Procedure: LAPAROSCOPIC VENTRAL HERNIA;  Surgeon: Gwenyth Ober, MD;  Location: [facility];  Service: General;  Laterality: N/A;    Social History:  Social History   Socioeconomic History  . Marital status: Married    Spouse name: Not on file  . Number of children: Not on file  . Years of education: Not on file  . Highest education level: Not on file  Occupational History  . Occupation: retired  Tobacco Use  .  Smoking status: Never Smoker  . Smokeless tobacco: Never Used  Substance and Sexual Activity  . Alcohol use: No    Alcohol/week: 1.0 standard drinks    Types: 1 Cans of beer per week    Comment: h/o heavy use, quit in 2017  . Drug use: No  . Sexual activity: Not Currently  Other Topics Concern  . Not on file  Social History Narrative  . Not on file   Social Determinants of Health   Financial Resource Strain: Low Risk   . Difficulty of Paying Living Expenses: Not hard at all  Food Insecurity: No Food Insecurity  . Worried About Charity fundraiser in the Last Year: Never true  . Ran Out of Food in the Last Year: Never true  Transportation Needs: No Transportation Needs  . Lack of Transportation (Medical): No  . Lack of Transportation (Non-Medical): No  Physical Activity: Inactive  . Days of Exercise per Week:  0 days  . Minutes of Exercise per Session: 0 min  Stress: No Stress Concern Present  . Feeling of Stress : Not at all  Social Connections:   . Frequency of Communication with Friends and Family:   . Frequency of Social Gatherings with Friends and Family:   . Attends Religious Services:   . Active Member of Clubs or Organizations:   . Attends Archivist Meetings:   [name] Kitchen Marital Status:   Intimate Partner Violence:   . Fear of Current or Ex-Partner:   . Emotionally Abused:   [name] Kitchen Physically Abused:   . Sexually Abused:     Family History:  Family History  Problem Relation Age of Onset  . CVA Mother   . Hypertension Brother   . Hypertension Sister   . Hypertension Brother   . Stroke Sister   . Heart attack Neg Hx     Medications:   Current Outpatient Medications on File Prior to Visit  Medication Sig Dispense Refill  . atorvastatin (LIPITOR) 80 MG tablet Take 1 tablet (80 mg total) by mouth daily at 6 PM. 30 tablet 3  . clopidogrel (PLAVIX) 75 MG tablet Take 1 tablet (75 mg total) by mouth daily. 30 tablet 2  . LUMIGAN 0.01 % SOLN      No current  facility-administered medications on file prior to visit.    Allergies:  No Known Allergies   Physical Exam  Vitals:   11/07/19 1054  BP: 118/80  Pulse: 66  Temp: (!) 97.4 F (36.3 C)  Weight: 138 lb (62.6 kg)  Height: 5' 10.5" (1.791 m)   Body mass index is 19.52 kg/m. No exam data present   General: well developed, well nourished,  pleasant elderly African-American male, seated, in no evident distress Head: head normocephalic and atraumatic.   Neck: supple with no carotid or supraclavicular bruits Cardiovascular: regular rate and rhythm, no murmurs Musculoskeletal: no deformity Skin:  no rash/petichiae Vascular:  Normal pulses all extremities   Neurologic Exam Mental Status: Awake and fully alert.  Oriented to place and time. Recent and remote memory intact. Attention span, concentration and fund of knowledge appropriate. Mood and affect appropriate.  Cranial Nerves: Right eye pupil equal, briskly reactive to light.  Chronic blindness left eye.  Extraocular movements full without nystagmus. Visual fields full to confrontation. Hearing intact to voice. Facial sensation intact. Face, tongue, palate moves normally and symmetrically.  Motor: Normal bulk and tone. Normal strength in all tested extremity muscles. Sensory.: intact to touch , pinprick , position and vibratory sensation.  Coordination: Rapid alternating movements normal in all extremities. Finger-to-nose and heel-to-shin performed accurately bilaterally. Gait and Station: Arises from chair without difficulty. Stance is normal. Gait demonstrates normal slow cautious steps with assistance of cane Reflexes: 1+ and symmetric. Toes downgoing.        ASSESSMENT: Trevor Murphy is a 85 y.o. year old male presented with 2-day history of left arm weakness, facial droop and slurred speech on 05/20/2019 with stroke work-up showing acute posterior right frontal lobe infarct embolic pattern secondary to unknown source.  Loop  recorder placed for monitoring of atrial fibrillation.  Vascular risk factors include HTN, HLD, CAD, CHF, prior strokes.  Loop recorder has not shown atrial fibrillation thus far.  Stable from a stroke standpoint without new or recurring stroke/TIA symptoms    PLAN:  1. Cryptogenic stroke:  -Continue clopidogrel 75 mg daily  and atorvastatin for secondary stroke prevention.  -Cardiology will continue to monitor  loop recorder for possible atrial fibrillation -Maintain strict control of hypertension with blood pressure goal below 130/90, diabetes with hemoglobin A1c goal below 6.5% and cholesterol with LDL cholesterol (bad cholesterol) goal below 70 mg/dL.  I also advised the patient to eat a healthy diet with plenty of whole grains, cereals, fruits and vegetables, exercise regularly with at least 30 minutes of continuous activity daily and maintain ideal body weight.   2. HTN: Stable.  Continue to follow with PCP 3. HLD: Continuation of atorvastatin and ongoing follow-up with PCP for monitoring management 4. CAD, CHF: Ongoing follow-up with cardiology as scheduled    Stable from stroke standpoint recommend follow-up   I spent 22 minutes of face-to-face and non-face-to-face time with patient.  This included previsit chart review, lab review, study review, order entry, electronic health record documentation, patient education regarding cryptogenic stroke and importance of stroke risk factor modification/management    Frann Rider, AGNP-BC  [facility] [address] [place], [place] [address]  Phone [phone] Fax [phone] Note: This document was prepared with digital dictation and possible smart phrase technology. Any transcriptional errors that result from this process are unintentional.

## 2019-11-07 NOTE — Progress Notes (Signed)
This visit occurred during the SARS-CoV-2 public health emergency.  Safety protocols were in place, including screening questions prior to the visit, additional usage of staff PPE, and extensive cleaning of exam room while observing appropriate contact time as indicated for disinfecting solutions.  Subjective:     Patient ID: Trevor Murphy , male    DOB: 21-Dec-1934 , 85 y.o.   MRN: [id number]   Chief Complaint  Patient presents with  . Prediabetes  . Hypertension    HPI  He has not had his covid vaccine - and does not plan on getting the vaccine. He lives in an apt complex.   Wt Readings from Last 3 Encounters: 11/07/19 : 137 lb 6.4 oz (62.3 kg) 11/07/19 : 138 lb (62.6 kg) 08/08/19 : 143 lb (64.9 kg)   Hypertension This is a chronic problem. The current episode started more than 1 year ago. The problem is controlled. Pertinent negatives include no anxiety, blurred vision, chest pain, headaches, palpitations or peripheral edema. There are no associated agents to hypertension. Risk factors for coronary artery disease include male gender. There are no compliance problems.   Diabetes He presents for his follow-up diabetic visit. Diabetes type: prediabetes. His disease course has been stable. Pertinent negatives for hypoglycemia include no dizziness or headaches. Pertinent negatives for diabetes include no blurred vision and no chest pain. Compliance with diabetes treatment: no medications, diet controlled.     Past Medical History:  Diagnosis Date  . Arthritis   . CAD (coronary artery disease)    a. Cath 08/2009: minimal nonobstructive disease (25% LAD). b. Lexiscan nuc 6/215: EF 54%, no ischemia or infarction.  . Chronic diastolic CHF (congestive heart failure) ([place])    a. Echo 2013: EF 55-60%, mild AI, mild MR. b. Echo 5/15 with EF 60-65%, no WMA, mild LVH, mild AI.  [name] Kitchen CKD (chronic kidney disease) stage 3, GFR 30-59 ml/min 07/26/2012   a. Felt likely due to HTN.  . CVA (cerebral  infarction) 07/28/2012  . Glaucoma   . Gout   . H/O umbilical hernia repair   . Headache(784.0)    " WHEN MY BLOOD PRESSURE IS UP"  . History of acute renal failure   . History of alcohol abuse   . HTN (hypertension)   . Hypertension    a. Patent renal arteries 2005.  [name] Kitchen Noncompliance   . OA (osteoarthritis)   . Other and unspecified hyperlipidemia   . Poor circulation   . Sinus bradycardia   . SVT (supraventricular tachycardia) ([place])    a. Possible AVNRT.  Got adenosine from EMS in 2015 for SVT.  During 5/15 hospitalization had episode of short R-P tachycardia (possible AVNRT) terminated by adenosine.  b. 04/2016 - recurrent tachycardia (SVT vs. AVNRT), again terminated by Adenosine  . Visual impairment      Family History  Problem Relation Age of Onset  . CVA Mother   . Hypertension Brother   . Hypertension Sister   . Hypertension Brother   . Stroke Sister   . Heart attack Neg Hx      Current Outpatient Medications:  .  atorvastatin (LIPITOR) 80 MG tablet, Take 1 tablet (80 mg total) by mouth daily at 6 PM., Disp: 30 tablet, Rfl: 3 .  clopidogrel (PLAVIX) 75 MG tablet, Take 1 tablet (75 mg total) by mouth daily., Disp: 30 tablet, Rfl: 2 .  LUMIGAN 0.01 % SOLN, , Disp: , Rfl:    No Known Allergies   Review of Systems  Constitutional: Negative.  Negative for chills.  Eyes: Negative for blurred vision.  Respiratory: Negative.   Cardiovascular: Negative.  Negative for chest pain, palpitations and leg swelling.  Musculoskeletal: Negative.   Skin: Negative.   Neurological: Negative for dizziness and headaches.  Psychiatric/Behavioral: Negative.      Today's Vitals   11/07/19 1154  BP: 112/78  Pulse: 64  Temp: 98.4 F (36.9 C)  TempSrc: Oral  Weight: 137 lb 6.4 oz (62.3 kg)  Height: _0  (1.702 m)  PainSc: 0-No pain   Body mass index is 21.52 kg/m.   Objective:  Physical Exam Constitutional:      Appearance: Normal appearance.  Cardiovascular:     Rate  and Rhythm: Normal rate and regular rhythm.     Pulses: Normal pulses.     Heart sounds: Normal heart sounds. No murmur.  Pulmonary:     Effort: Pulmonary effort is normal. No respiratory distress.     Breath sounds: Normal breath sounds.  Skin:    General: Skin is warm and dry.     Capillary Refill: Capillary refill takes less than 2 seconds.     Coloration: Skin is not jaundiced.  Neurological:     General: No focal deficit present.     Mental Status: He is alert and oriented to person, place, and time.         Assessment And Plan:     1. Essential hypertension . B/P is controlled.  . CMP ordered to check renal function.  . The importance of regular exercise and dietary modification was stressed to the patient.  [name] Kitchen He is not on any antihypertensive medication due to symptomatic bradycardia.  - BMP8+eGFR  2. Stage 3 chronic kidney disease, unspecified whether stage 3a or 3b CKD  Will recheck the kidney functions  Encouraged to continue with good water intake.  - BMP8+eGFR  3. Dyslipidemia  Chronic, normal except for HDL at last visit  Continue with cholesterol medication  Will check lipid panel - Lipid panel  4. Anemia, unspecified type  Chronic, will check iron studies - Iron, TIBC and Ferritin Panel  5. Prediabetes  Chronic, normal at last visit HgbA1c.  Diabetic foot exam done with decreased sensation and 1+ pedal pulses bilaterally  He is scheduled for an eye exam at the beginning of the year.  - Hemoglobin A1c  6. Cerebral infarction, unspecified mechanism ([place])  He has been cleared from the Neurologist  Continue with plavix as directed by Neurology  [name], FNP    THE PATIENT IS ENCOURAGED TO PRACTICE SOCIAL DISTANCING DUE TO THE COVID-19 PANDEMIC.

## 2019-11-08 LAB — LIPID PANEL
Chol/HDL Ratio: 4 ratio (ref 0.0–5.0)
Cholesterol, Total: 129 mg/dL (ref 100–199)
HDL: 32 mg/dL — ABNORMAL LOW (ref >39)
LDL Chol Calc (NIH): 79 mg/dL (ref 0–99)
Triglycerides: 94 mg/dL (ref 0–149)
VLDL Cholesterol Cal: 18 mg/dL (ref 5–40)

## 2019-11-08 LAB — BMP8+EGFR
BUN/Creatinine Ratio: 12 (ref 10–24)
BUN: 20 mg/dL (ref 8–27)
CO2: 17 mmol/L — ABNORMAL LOW (ref 20–29)
Calcium: 8.2 mg/dL — ABNORMAL LOW (ref 8.6–10.2)
Chloride: 106 mmol/L (ref 96–106)
Creatinine, Ser: 1.69 mg/dL — ABNORMAL HIGH (ref 0.76–1.27)
GFR calc Af Amer: 43 mL/min/{1.73_m2} — ABNORMAL LOW (ref >59)
GFR calc non Af Amer: 37 mL/min/{1.73_m2} — ABNORMAL LOW (ref >59)
Glucose: 82 mg/dL (ref 65–99)
Potassium: 4.8 mmol/L (ref 3.5–5.2)
Sodium: 139 mmol/L (ref 134–144)

## 2019-11-08 LAB — IRON,TIBC AND FERRITIN PANEL
Ferritin: 29 ng/mL — ABNORMAL LOW (ref 30–400)
Iron Saturation: 9 % — CL (ref 15–55)
Iron: 27 ug/dL — ABNORMAL LOW (ref 38–169)
Total Iron Binding Capacity: 287 ug/dL (ref 250–450)
UIBC: 260 ug/dL (ref 111–343)

## 2019-11-09 ENCOUNTER — Telehealth: Payer: Self-pay | Admitting: Emergency Medicine

## 2019-11-09 ENCOUNTER — Other Ambulatory Visit: Payer: Self-pay | Admitting: Nurse Practitioner

## 2019-11-09 ENCOUNTER — Ambulatory Visit (INDEPENDENT_AMBULATORY_CARE_PROVIDER_SITE_OTHER): Payer: Medicare Other | Admitting: *Deleted

## 2019-11-09 DIAGNOSIS — D509 Iron deficiency anemia, unspecified: Secondary | ICD-10-CM

## 2019-11-09 DIAGNOSIS — I5032 Chronic diastolic (congestive) heart failure: Secondary | ICD-10-CM

## 2019-11-09 LAB — CUP PACEART REMOTE DEVICE CHECK
Date Time Interrogation Session: 20210331230430
Implantable Pulse Generator Implant Date: 20201014

## 2019-11-09 MED ORDER — FERROUS SULFATE 325 (65 FE) MG PO TABS: 325.0000 mg | ORAL_TABLET | Freq: Every day | ORAL | 3 refills | Status: DC

## 2019-11-09 NOTE — Telephone Encounter (Signed)
Called to assess patient for symptoms during episodes of bradycardia with HR 34-37 bpm recorded by loop recorder on 10/13/19 .  Patient was unaware of events and reports he has hade no syncopal episodes, dizziness, CP or sob. Patient will call office if he has any change in condition or sek treatment in ED after hours or on weekends.

## 2019-11-10 NOTE — Progress Notes (Signed)
I agree with the above plan 

## 2019-11-10 NOTE — Progress Notes (Signed)
ILR Remote 

## 2019-11-24 ENCOUNTER — Ambulatory Visit: Payer: Medicare Other | Admitting: Podiatry

## 2019-11-28 ENCOUNTER — Other Ambulatory Visit: Payer: Self-pay

## 2019-11-28 ENCOUNTER — Encounter: Payer: Self-pay | Admitting: Podiatry

## 2019-11-28 ENCOUNTER — Ambulatory Visit (INDEPENDENT_AMBULATORY_CARE_PROVIDER_SITE_OTHER): Payer: Medicare Other | Admitting: Podiatry

## 2019-11-28 VITALS — Temp 96.9°F

## 2019-11-28 DIAGNOSIS — M79674 Pain in right toe(s): Secondary | ICD-10-CM | POA: Diagnosis not present

## 2019-11-28 DIAGNOSIS — B351 Tinea unguium: Secondary | ICD-10-CM

## 2019-11-28 DIAGNOSIS — N183 Chronic kidney disease, stage 3 unspecified: Secondary | ICD-10-CM | POA: Diagnosis not present

## 2019-11-28 DIAGNOSIS — I739 Peripheral vascular disease, unspecified: Secondary | ICD-10-CM

## 2019-11-28 DIAGNOSIS — M79675 Pain in left toe(s): Secondary | ICD-10-CM

## 2019-11-28 NOTE — Progress Notes (Signed)
This patient returns to my office for at risk foot care.  This patient requires this care by a professional since this patient will be at risk due to having chronic kidney disease and coagulation defect.  Patient is taking plavix. Patient presents to the office with hisnfemale caregiver. This patient is unable to cut nails himself since the patient cannot reach his nails.These nails are painful walking and wearing shoes.  This patient presents for at risk foot care today.  General Appearance  Alert, conversant and in no acute stress.  Vascular  Dorsalis pedis and posterior tibial  pulses are weakly  palpable  bilaterally.  Capillary return is within normal limits  bilaterally. Temperature is within normal limits  bilaterally.  Neurologic  Senn-Weinstein monofilament wire test within normal limits  bilaterally. Muscle power within normal limits bilaterally.  Nails Thick disfigured discolored nails with subungual debris  from hallux to fifth toes bilaterally. No evidence of bacterial infection or drainage bilaterally.  Orthopedic  No limitations of motion  feet .  No crepitus or effusions noted.  No bony pathology or digital deformities noted.  HAV  B/L.  Skin  normotropic skin with no porokeratosis noted bilaterally.  No signs of infections or ulcers noted.     Onychomycosis  Pain in right toes  Pain in left toes  Consent was obtained for treatment procedures.   Mechanical debridement of nails 1-5  bilaterally performed with a nail nipper.  Filed with dremel without incident.    Return office visit     9 weeks                Told patient to return for periodic foot care and evaluation due to potential at risk complications.   Gardiner Barefoot DPM

## 2019-12-10 LAB — CUP PACEART REMOTE DEVICE CHECK
Date Time Interrogation Session: 20210502002901
Implantable Pulse Generator Implant Date: 20201014

## 2019-12-11 ENCOUNTER — Ambulatory Visit (INDEPENDENT_AMBULATORY_CARE_PROVIDER_SITE_OTHER): Payer: Medicare Other | Admitting: *Deleted

## 2019-12-11 DIAGNOSIS — I639 Cerebral infarction, unspecified: Secondary | ICD-10-CM

## 2019-12-11 LAB — CUP PACEART REMOTE DEVICE CHECK
Date Time Interrogation Session: 20210501230618
Implantable Pulse Generator Implant Date: 20201014

## 2019-12-12 NOTE — Progress Notes (Signed)
Carelink Summary Report / Loop Recorder 

## 2020-01-11 ENCOUNTER — Ambulatory Visit (INDEPENDENT_AMBULATORY_CARE_PROVIDER_SITE_OTHER): Payer: Medicare Other | Admitting: *Deleted

## 2020-01-11 DIAGNOSIS — I639 Cerebral infarction, unspecified: Secondary | ICD-10-CM

## 2020-01-11 LAB — CUP PACEART REMOTE DEVICE CHECK
Date Time Interrogation Session: 20210602230617
Implantable Pulse Generator Implant Date: 20201014

## 2020-01-16 NOTE — Progress Notes (Signed)
Carelink Summary Report / Loop Recorder 

## 2020-01-31 ENCOUNTER — Ambulatory Visit: Payer: Medicare Other | Admitting: Podiatry

## 2020-02-13 ENCOUNTER — Ambulatory Visit (INDEPENDENT_AMBULATORY_CARE_PROVIDER_SITE_OTHER): Payer: Medicare Other | Admitting: *Deleted

## 2020-02-13 DIAGNOSIS — I639 Cerebral infarction, unspecified: Secondary | ICD-10-CM | POA: Diagnosis not present

## 2020-02-13 LAB — CUP PACEART REMOTE DEVICE CHECK
Date Time Interrogation Session: 20210705230140
Implantable Pulse Generator Implant Date: 20201014

## 2020-02-14 NOTE — Progress Notes (Signed)
Carelink Summary Report / Loop Recorder 

## 2020-03-12 ENCOUNTER — Ambulatory Visit (INDEPENDENT_AMBULATORY_CARE_PROVIDER_SITE_OTHER): Payer: Medicare Other | Admitting: Nurse Practitioner

## 2020-03-12 ENCOUNTER — Other Ambulatory Visit: Payer: Self-pay

## 2020-03-12 ENCOUNTER — Encounter: Payer: Self-pay | Admitting: Nurse Practitioner

## 2020-03-12 VITALS — BP 120/82 | HR 89 | Temp 97.3°F | Ht 64.0 in | Wt 135.4 lb

## 2020-03-12 DIAGNOSIS — N183 Chronic kidney disease, stage 3 unspecified: Secondary | ICD-10-CM

## 2020-03-12 DIAGNOSIS — Z2821 Immunization not carried out because of patient refusal: Secondary | ICD-10-CM

## 2020-03-12 DIAGNOSIS — D509 Iron deficiency anemia, unspecified: Secondary | ICD-10-CM | POA: Diagnosis not present

## 2020-03-12 DIAGNOSIS — I10 Essential (primary) hypertension: Secondary | ICD-10-CM | POA: Diagnosis not present

## 2020-03-12 DIAGNOSIS — R7303 Prediabetes: Secondary | ICD-10-CM | POA: Diagnosis not present

## 2020-03-12 MED ORDER — FERROUS SULFATE 325 (65 FE) MG PO TABS: 325.0000 mg | ORAL_TABLET | Freq: Every day | ORAL | 1 refills | Status: DC

## 2020-03-12 MED ORDER — CLOPIDOGREL BISULFATE 75 MG PO TABS: 75.0000 mg | ORAL_TABLET | Freq: Every day | ORAL | 1 refills | Status: DC

## 2020-03-12 MED ORDER — ATORVASTATIN CALCIUM 80 MG PO TABS: 80.0000 mg | ORAL_TABLET | Freq: Every day | ORAL | 1 refills | Status: DC

## 2020-03-12 MED ORDER — FERROUS SULFATE 325 (65 FE) MG PO TABS: 325.0000 mg | ORAL_TABLET | Freq: Every day | ORAL | 0 refills | Status: DC

## 2020-03-12 NOTE — Patient Instructions (Signed)
COVID-19 Vaccine Information can be found at: [email or web address] For questions related to vaccine distribution or appointments, please email [email or web address] or call [phone]    

## 2020-03-12 NOTE — Progress Notes (Signed)
This visit occurred during the SARS-CoV-2 public health emergency.  Safety protocols were in place, including screening questions prior to the visit, additional usage of staff PPE, and extensive cleaning of exam room while observing appropriate contact time as indicated for disinfecting solutions.  Subjective:     Patient ID: Trevor Murphy , male    DOB: 01/26/1935 , 85 y.o.   MRN: [id number]   Chief Complaint  Patient presents with  . Hypertension  . iron f/u    HPI  Here for iron follow up.  He reports he slid off the bed without any injuries. Reports having lightheadedness.      Past Medical History:  Diagnosis Date  . Arthritis   . CAD (coronary artery disease)    a. Cath 08/2009: minimal nonobstructive disease (25% LAD). b. Lexiscan nuc 6/215: EF 54%, no ischemia or infarction.  . Chronic diastolic CHF (congestive heart failure) ([place])    a. Echo 2013: EF 55-60%, mild AI, mild MR. b. Echo 5/15 with EF 60-65%, no WMA, mild LVH, mild AI.  [name] Kitchen CKD (chronic kidney disease) stage 3, GFR 30-59 ml/min 07/26/2012   a. Felt likely due to HTN.  . CVA (cerebral infarction) 07/28/2012  . Glaucoma   . Gout   . H/O umbilical hernia repair   . Headache(784.0)    " WHEN MY BLOOD PRESSURE IS UP"  . History of acute renal failure   . History of alcohol abuse   . HTN (hypertension)   . Hypertension    a. Patent renal arteries 2005.  [name] Kitchen Noncompliance   . OA (osteoarthritis)   . Other and unspecified hyperlipidemia   . Poor circulation   . Sinus bradycardia   . SVT (supraventricular tachycardia) ([place])    a. Possible AVNRT.  Got adenosine from EMS in 2015 for SVT.  During 5/15 hospitalization had episode of short R-P tachycardia (possible AVNRT) terminated by adenosine.  b. 04/2016 - recurrent tachycardia (SVT vs. AVNRT), again terminated by Adenosine  . Visual impairment      Family History  Problem Relation Age of Onset  . CVA Mother   . Hypertension Brother   . Hypertension Sister    . Hypertension Brother   . Stroke Sister   . Heart attack Neg Hx      Current Outpatient Medications:  .  atorvastatin (LIPITOR) 80 MG tablet, Take 1 tablet (80 mg total) by mouth daily at 6 PM., Disp: 90 tablet, Rfl: 1 .  clopidogrel (PLAVIX) 75 MG tablet, Take 1 tablet (75 mg total) by mouth daily., Disp: 90 tablet, Rfl: 1 .  ferrous sulfate 325 (65 FE) MG tablet, Take 1 tablet (325 mg total) by mouth daily., Disp: 90 tablet, Rfl: 1 .  LUMIGAN 0.01 % SOLN, , Disp: , Rfl:    No Known Allergies   Review of Systems  Constitutional: Negative.  Negative for fatigue.  Respiratory: Negative.  Negative for shortness of breath.   Cardiovascular: Negative.  Negative for chest pain, palpitations and leg swelling.  Endocrine: Negative for polydipsia, polyphagia and polyuria.  Neurological: Positive for light-headedness. Negative for dizziness and headaches.  Psychiatric/Behavioral: Negative.      Today's Vitals   03/12/20 1154  BP: 120/82  Pulse: 89  Temp: (!) 97.3 F (36.3 C)  TempSrc: Oral  Weight: 135 lb 6.4 oz (61.4 kg)  Height: 5' 4"  (1.626 m)  PainSc: 0-No pain   Body mass index is 23.24 kg/m.   Objective:  Physical Exam Constitutional:  General: He is not in acute distress.    Appearance: Normal appearance.  Cardiovascular:     Rate and Rhythm: Normal rate and regular rhythm.     Pulses: Normal pulses.     Heart sounds: Normal heart sounds. No murmur heard.   Pulmonary:     Effort: Pulmonary effort is normal. No respiratory distress.     Breath sounds: Normal breath sounds.  Neurological:     General: No focal deficit present.     Mental Status: He is alert and oriented to person, place, and time.     Cranial Nerves: No cranial nerve deficit.  Psychiatric:        Mood and Affect: Mood normal.        Behavior: Behavior normal.        Thought Content: Thought content normal.        Judgment: Judgment normal.         Assessment And Plan:     1.  Essential hypertension Chronic, blood pressure is well controlled - BMP8+eGFR  2. Iron deficiency anemia, unspecified iron deficiency anemia type Chronic, encouraged to take his iron daily - ferrous sulfate 325 (65 FE) MG tablet; Take 1 tablet (325 mg total) by mouth daily.  Dispense: 90 tablet; Refill: 1 - Iron, TIBC and Ferritin Panel - CBC  3. Stage 3 chronic kidney disease, unspecified whether stage 3a or 3b CKD  4. Prediabetes Chronic, stable Encouraged to avoid sugary foods, drinks and snacks - Hemoglobin A1c  5. COVID-19 virus vaccination declined Declines covid 19 vaccine. Discussed risk of covid 37 and if he changes her mind about the vaccine to call the office.  Encouraged to take multivitamin, vitamin d, vitamin c and zinc to increase immune system. Aware can call office if would like to have vaccine here at office.       Patient was given opportunity to ask questions. Patient verbalized understanding of the plan and was able to repeat key elements of the plan. All questions were answered to their satisfaction.  [name], FNP   I, [name], FNP, have reviewed all documentation for this visit. The documentation on 03/12/20 for the exam, diagnosis, procedures, and orders are all accurate and complete.  THE PATIENT IS ENCOURAGED TO PRACTICE SOCIAL DISTANCING DUE TO THE COVID-19 PANDEMIC.

## 2020-03-13 LAB — BMP8+EGFR
BUN/Creatinine Ratio: 12 (ref 10–24)
BUN: 19 mg/dL (ref 8–27)
CO2: 21 mmol/L (ref 20–29)
Calcium: 7.9 mg/dL — ABNORMAL LOW (ref 8.6–10.2)
Chloride: 108 mmol/L — ABNORMAL HIGH (ref 96–106)
Creatinine, Ser: 1.6 mg/dL — ABNORMAL HIGH (ref 0.76–1.27)
GFR calc Af Amer: 46 mL/min/{1.73_m2} — ABNORMAL LOW (ref >59)
GFR calc non Af Amer: 40 mL/min/{1.73_m2} — ABNORMAL LOW (ref >59)
Glucose: 89 mg/dL (ref 65–99)
Potassium: 5.1 mmol/L (ref 3.5–5.2)
Sodium: 138 mmol/L (ref 134–144)

## 2020-03-13 LAB — IRON,TIBC AND FERRITIN PANEL
Ferritin: 59 ng/mL (ref 30–400)
Iron Saturation: 7 % — CL (ref 15–55)
Iron: 23 ug/dL — ABNORMAL LOW (ref 38–169)
Total Iron Binding Capacity: 313 ug/dL (ref 250–450)
UIBC: 290 ug/dL (ref 111–343)

## 2020-03-13 LAB — HEMOGLOBIN A1C
Est. average glucose Bld gHb Est-mCnc: 105 mg/dL
Hgb A1c MFr Bld: 5.3 % (ref 4.8–5.6)

## 2020-03-13 LAB — CBC
Hematocrit: 31.5 % — ABNORMAL LOW (ref 37.5–51.0)
Hemoglobin: 9.2 g/dL — ABNORMAL LOW (ref 13.0–17.7)
MCH: 22.5 pg — ABNORMAL LOW (ref 26.6–33.0)
MCHC: 29.2 g/dL — ABNORMAL LOW (ref 31.5–35.7)
MCV: 77 fL — ABNORMAL LOW (ref 79–97)
Platelets: 134 10*3/uL — ABNORMAL LOW (ref 150–450)
RBC: 4.08 x10E6/uL — ABNORMAL LOW (ref 4.14–5.80)
RDW: 17.7 % — ABNORMAL HIGH (ref 11.6–15.4)
WBC: 2.7 10*3/uL — ABNORMAL LOW (ref 3.4–10.8)

## 2020-03-14 ENCOUNTER — Ambulatory Visit (INDEPENDENT_AMBULATORY_CARE_PROVIDER_SITE_OTHER): Payer: Medicare Other | Admitting: *Deleted

## 2020-03-14 DIAGNOSIS — I639 Cerebral infarction, unspecified: Secondary | ICD-10-CM

## 2020-03-18 LAB — CUP PACEART REMOTE DEVICE CHECK
Date Time Interrogation Session: 20210807230342
Implantable Pulse Generator Implant Date: 20201014

## 2020-03-18 NOTE — Progress Notes (Signed)
Carelink Summary Report / Loop Recorder 

## 2020-04-03 ENCOUNTER — Encounter: Payer: Self-pay | Admitting: Podiatry

## 2020-04-03 ENCOUNTER — Ambulatory Visit (INDEPENDENT_AMBULATORY_CARE_PROVIDER_SITE_OTHER): Payer: Medicare Other | Admitting: Podiatry

## 2020-04-03 ENCOUNTER — Other Ambulatory Visit: Payer: Self-pay

## 2020-04-03 DIAGNOSIS — I739 Peripheral vascular disease, unspecified: Secondary | ICD-10-CM

## 2020-04-03 DIAGNOSIS — N183 Chronic kidney disease, stage 3 unspecified: Secondary | ICD-10-CM

## 2020-04-03 DIAGNOSIS — B351 Tinea unguium: Secondary | ICD-10-CM

## 2020-04-03 DIAGNOSIS — M79675 Pain in left toe(s): Secondary | ICD-10-CM

## 2020-04-03 DIAGNOSIS — M79674 Pain in right toe(s): Secondary | ICD-10-CM

## 2020-04-03 NOTE — Progress Notes (Signed)
This patient returns to my office for at risk foot care.  This patient requires this care by a professional since this patient will be at risk due to having chronic kidney disease and coagulation defect.  Patient is taking plavix. Patient presents to the office with hisnfemale caregiver.  This patient is unable to cut nails himself since the patient cannot reach his nails.These nails are painful walking and wearing shoes.  This patient presents for at risk foot care today.  General Appearance  Alert, conversant and in no acute stress.  Vascular  Dorsalis pedis and posterior tibial  pulses are weakly  palpable  bilaterally.  Capillary return is within normal limits  bilaterally. Temperature is within normal limits  bilaterally.  Neurologic  Senn-Weinstein monofilament wire test within normal limits  bilaterally. Muscle power within normal limits bilaterally.  Nails Thick disfigured discolored nails with subungual debris  from hallux to fifth toes bilaterally. No evidence of bacterial infection or drainage bilaterally.  Orthopedic  No limitations of motion  feet .  No crepitus or effusions noted.  No bony pathology or digital deformities noted.  HAV  B/L.  Skin  normotropic skin with no porokeratosis noted bilaterally.  No signs of infections or ulcers noted.     Onychomycosis  Pain in right toes  Pain in left toes  Consent was obtained for treatment procedures.   Mechanical debridement of nails 1-5  bilaterally performed with a nail nipper.  Filed with dremel without incident.    Return office visit     10  weeks                Told patient to return for periodic foot care and evaluation due to potential at risk complications.   Gardiner Barefoot DPM

## 2020-04-16 ENCOUNTER — Ambulatory Visit (INDEPENDENT_AMBULATORY_CARE_PROVIDER_SITE_OTHER): Payer: Medicare Other | Admitting: *Deleted

## 2020-04-16 DIAGNOSIS — I639 Cerebral infarction, unspecified: Secondary | ICD-10-CM | POA: Diagnosis not present

## 2020-04-17 DIAGNOSIS — H401133 Primary open-angle glaucoma, bilateral, severe stage: Secondary | ICD-10-CM | POA: Diagnosis not present

## 2020-04-17 DIAGNOSIS — H35033 Hypertensive retinopathy, bilateral: Secondary | ICD-10-CM | POA: Diagnosis not present

## 2020-04-19 LAB — CUP PACEART REMOTE DEVICE CHECK
Date Time Interrogation Session: 20210909230152
Implantable Pulse Generator Implant Date: 20201014

## 2020-04-19 NOTE — Progress Notes (Signed)
Carelink Summary Report / Loop Recorder 

## 2020-05-16 ENCOUNTER — Ambulatory Visit: Payer: Medicare Other

## 2020-05-22 LAB — CUP PACEART REMOTE DEVICE CHECK
Date Time Interrogation Session: 20211012230120
Implantable Pulse Generator Implant Date: 20201014

## 2020-06-06 ENCOUNTER — Telehealth: Payer: Self-pay

## 2020-06-06 NOTE — Telephone Encounter (Signed)
ILR implanted for CVA. Alert received 06/06/20 for 14 new episodes of possible AF, 2.8% of the time. Some are SR w/ ectopy, although some appear to be atrial flutter. Currently no OAC.  EGM's showed to A. Tillery, PA, states patient does have possible AFL. Recommends reaching out to Dr. Curt Bears for his review and recommendations. Message sent along with pictures to Dr. Curt Bears. Will await for recommendations.

## 2020-06-06 NOTE — Telephone Encounter (Signed)
Called and spoke with patient.  He is aware of appt 10/29 at 10:30 am with Adline Peals, PA.

## 2020-06-06 NOTE — Telephone Encounter (Signed)
Per Dr. Curt Bears, he agrees, appears AF. Would like for patient to be seen in the AF Clinic.  Called patient to inform. Patient reports he felt fine yesterday and had no complaints.   Routing to AF Clinic.  [phone]- please call patient on this number.

## 2020-06-07 ENCOUNTER — Ambulatory Visit (HOSPITAL_COMMUNITY)
Admission: RE | Admit: 2020-06-07 | Discharge: 2020-06-07 | Disposition: A | Discharge status: Home / Self Care | Payer: Medicare Other | Source: Ambulatory Visit | Attending: Physician Assistant | Admitting: Physician Assistant

## 2020-06-07 ENCOUNTER — Other Ambulatory Visit: Payer: Self-pay

## 2020-06-07 ENCOUNTER — Encounter (HOSPITAL_COMMUNITY): Payer: Self-pay | Admitting: Physician Assistant

## 2020-06-07 VITALS — BP 148/80 | HR 65 | Ht 64.0 in | Wt 139.8 lb

## 2020-06-07 DIAGNOSIS — Z79899 Other long term (current) drug therapy: Secondary | ICD-10-CM | POA: Diagnosis not present

## 2020-06-07 DIAGNOSIS — Z8673 Personal history of transient ischemic attack (TIA), and cerebral infarction without residual deficits: Secondary | ICD-10-CM | POA: Diagnosis not present

## 2020-06-07 DIAGNOSIS — D6869 Other thrombophilia: Secondary | ICD-10-CM | POA: Insufficient documentation

## 2020-06-07 DIAGNOSIS — Z87898 Personal history of other specified conditions: Secondary | ICD-10-CM

## 2020-06-07 DIAGNOSIS — I5032 Chronic diastolic (congestive) heart failure: Secondary | ICD-10-CM | POA: Diagnosis not present

## 2020-06-07 DIAGNOSIS — N183 Chronic kidney disease, stage 3 unspecified: Secondary | ICD-10-CM | POA: Diagnosis not present

## 2020-06-07 DIAGNOSIS — Z8249 Family history of ischemic heart disease and other diseases of the circulatory system: Secondary | ICD-10-CM | POA: Insufficient documentation

## 2020-06-07 DIAGNOSIS — Z7901 Long term (current) use of anticoagulants: Secondary | ICD-10-CM | POA: Insufficient documentation

## 2020-06-07 DIAGNOSIS — D649 Anemia, unspecified: Secondary | ICD-10-CM | POA: Diagnosis not present

## 2020-06-07 DIAGNOSIS — I2510 Atherosclerotic heart disease of native coronary artery without angina pectoris: Secondary | ICD-10-CM | POA: Insufficient documentation

## 2020-06-07 DIAGNOSIS — I480 Paroxysmal atrial fibrillation: Secondary | ICD-10-CM | POA: Diagnosis not present

## 2020-06-07 DIAGNOSIS — I130 Hypertensive heart and chronic kidney disease with heart failure and stage 1 through stage 4 chronic kidney disease, or unspecified chronic kidney disease: Secondary | ICD-10-CM | POA: Diagnosis not present

## 2020-06-07 LAB — CBC
HCT: 31.6 % — ABNORMAL LOW (ref 39.0–52.0)
Hemoglobin: 8.8 g/dL — ABNORMAL LOW (ref 13.0–17.0)
MCH: 21.6 pg — ABNORMAL LOW (ref 26.0–34.0)
MCHC: 27.8 g/dL — ABNORMAL LOW (ref 30.0–36.0)
MCV: 77.6 fL — ABNORMAL LOW (ref 80.0–100.0)
Platelets: 147 10*3/uL — ABNORMAL LOW (ref 150–400)
RBC: 4.07 MIL/uL — ABNORMAL LOW (ref 4.22–5.81)
RDW: 20.6 % — ABNORMAL HIGH (ref 11.5–15.5)
WBC: 3.3 10*3/uL — ABNORMAL LOW (ref 4.0–10.5)
nRBC: 0 % (ref 0.0–0.2)

## 2020-06-07 LAB — BASIC METABOLIC PANEL
Anion gap: 9 (ref 5–15)
BUN: 26 mg/dL — ABNORMAL HIGH (ref 8–23)
CO2: 18 mmol/L — ABNORMAL LOW (ref 22–32)
Calcium: 7.9 mg/dL — ABNORMAL LOW (ref 8.9–10.3)
Chloride: 111 mmol/L (ref 98–111)
Creatinine, Ser: 1.74 mg/dL — ABNORMAL HIGH (ref 0.61–1.24)
GFR, Estimated: 38 mL/min — ABNORMAL LOW (ref >60)
Glucose, Bld: 96 mg/dL (ref 70–99)
Potassium: 5.1 mmol/L (ref 3.5–5.1)
Sodium: 138 mmol/L (ref 135–145)

## 2020-06-07 MED ORDER — APIXABAN 2.5 MG PO TABS
2.5000 mg | ORAL_TABLET | Freq: Two times a day (BID) | ORAL | 3 refills | Status: DC
Start: 2020-06-07 — End: 2020-07-19

## 2020-06-07 NOTE — Patient Instructions (Signed)
Stop Plavix Start Eliquis 2.5mg  -take one tablet by mouth twice daily

## 2020-06-07 NOTE — Progress Notes (Signed)
Call patient or his sister to see if he is still taking his iron supplement daily?

## 2020-06-07 NOTE — Progress Notes (Signed)
Primary Care Physician: [name], [place] Primary Cardiologist: Dr Aundra Dubin (remotely) Primary Electrophysiologist: Dr Curt Bears  Referring Physician: Dr Elin[facility]   Trevor Murphy is a 85 y.o. male with a history of SVT, non obstructive CAD, chronic diastolic CHF, anemia, CKD stage III, prior CVA, HTN, and paroxysmal atrial fibrillation who presents for follow up in the [facility]. The patient was initially diagnosed with atrial fibrillation 06/06/20 on ILR following a cryptogenic stroke in 05/2019. Patient was asymptomatic and rate controlled during the episodes. Some appear to be true afib and others SR with PACs. All < 10 minutes. Patient has a CHADS2VASC score of 6.   Today, he denies symptoms of palpitations, chest pain, shortness of breath, orthopnea, PND, lower extremity edema, dizziness, presyncope, syncope, snoring, daytime somnolence, bleeding, or neurologic sequela. The patient is tolerating medications without difficulties and is otherwise without complaint today.    Atrial Fibrillation Risk Factors:  he does not have symptoms or diagnosis of sleep apnea. he does not have a history of rheumatic fever. he does not have a history of alcohol use.   he has a BMI of Body mass index is 24 kg/m.[name] Kitchen Filed Weights   06/07/20 1022  Weight: 63.4 kg    Family History  Problem Relation Age of Onset  . CVA Mother   . Hypertension Brother   . Hypertension Sister   . Hypertension Brother   . Stroke Sister   . Heart attack Neg Hx      Atrial Fibrillation Management history:  Previous antiarrhythmic drugs: none Previous cardioversions: none Previous ablations: none CHADS2VASC score: 6 Anticoagulation history: none   Past Medical History:  Diagnosis Date  . Arthritis   . CAD (coronary artery disease)    a. Cath 08/2009: minimal nonobstructive disease (25% LAD). b. Lexiscan nuc 6/215: EF 54%, no ischemia or infarction.  . Chronic  diastolic CHF (congestive heart failure) ([place])    a. Echo 2013: EF 55-60%, mild AI, mild MR. b. Echo 5/15 with EF 60-65%, no WMA, mild LVH, mild AI.  [name] Kitchen CKD (chronic kidney disease) stage 3, GFR 30-59 ml/min ([facility]) 07/26/2012   a. Felt likely due to HTN.  . CVA (cerebral infarction) 07/28/2012  . Glaucoma   . Gout   . H/O umbilical hernia repair   . Headache(784.0)    " WHEN MY BLOOD PRESSURE IS UP"  . History of acute renal failure   . History of alcohol abuse   . HTN (hypertension)   . Hypertension    a. Patent renal arteries 2005.  [name] Kitchen Noncompliance   . OA (osteoarthritis)   . Other and unspecified hyperlipidemia   . Poor circulation   . Sinus bradycardia   . SVT (supraventricular tachycardia) ([place])    a. Possible AVNRT.  Got adenosine from EMS in 2015 for SVT.  During 5/15 hospitalization had episode of short R-P tachycardia (possible AVNRT) terminated by adenosine.  b. 04/2016 - recurrent tachycardia (SVT vs. AVNRT), again terminated by Adenosine  . Visual impairment    Past Surgical History:  Procedure Laterality Date  . APPENDECTOMY    . BUBBLE STUDY  05/24/2019   Procedure: BUBBLE STUDY;  Surgeon: Donato Heinz, MD;  Location: [facility];  Service: Endoscopy;;  . CARDIAC CATHETERIZATION  11/2003   EF 50-55%  . CARDIAC CATHETERIZATION  08/2009  . CATARACT EXTRACTION EXTRACAPSULAR Right 05/17/2013   Procedure: CATARACT EXTRACTION EXTRACAPSULAR WITH INTRAOCULAR LENS PLACEMENT ([place]) RIGHT EYE;  Surgeon: Marylynn Pearson,  MD;  Location: [facility];  Service: Ophthalmology;  Laterality: Right;  . COLONOSCOPY    . HERNIA REPAIR    . LOOP RECORDER INSERTION N/A 05/24/2019   Procedure: LOOP RECORDER INSERTION;  Surgeon: Constance Haw, MD;  Location: [facility] CV [facility];  Service: Cardiovascular;  Laterality: N/A;  . TEE WITHOUT CARDIOVERSION N/A 05/24/2019   Procedure: TRANSESOPHAGEAL ECHOCARDIOGRAM (TEE);  Surgeon: Donato Heinz, MD;  Location: [facility] ENDOSCOPY;   Service: Endoscopy;  Laterality: N/A;  . VENTRAL HERNIA REPAIR  03/07/2012   Procedure: LAPAROSCOPIC VENTRAL HERNIA;  Surgeon: Gwenyth Ober, MD;  Location: [place] [facility];  Service: General;  Laterality: N/A;    Current Outpatient Medications  Medication Sig Dispense Refill  . atorvastatin (LIPITOR) 80 MG tablet Take 1 tablet (80 mg total) by mouth daily at 6 PM. 90 tablet 1  . ferrous sulfate 325 (65 FE) MG tablet Take 1 tablet (325 mg total) by mouth daily. 90 tablet 1  . LUMIGAN 0.01 % SOLN     . apixaban (ELIQUIS) 2.5 MG TABS tablet Take 1 tablet (2.5 mg total) by mouth 2 (two) times daily. 60 tablet 3   No current facility-administered medications for this encounter.    No Known Allergies  Social History   Socioeconomic History  . Marital status: Married    Spouse name: Not on file  . Number of children: Not on file  . Years of education: Not on file  . Highest education level: Not on file  Occupational History  . Occupation: retired  Tobacco Use  . Smoking status: Never Smoker  . Smokeless tobacco: Never Used  Vaping Use  . Vaping Use: Never used  Substance and Sexual Activity  . Alcohol use: No    Alcohol/week: 1.0 standard drink    Types: 1 Cans of beer per week    Comment: h/o heavy use, quit in 2017  . Drug use: No  . Sexual activity: Not Currently  Other Topics Concern  . Not on file  Social History Narrative  . Not on file   Social Determinants of Health   Financial Resource Strain: Low Risk   . Difficulty of Paying Living Expenses: Not hard at all  Food Insecurity: No Food Insecurity  . Worried About Charity fundraiser in the Last Year: Never true  . Ran Out of Food in the Last Year: Never true  Transportation Needs: No Transportation Needs  . Lack of Transportation (Medical): No  . Lack of Transportation (Non-Medical): No  Physical Activity: Inactive  . Days of Exercise per Week: 0 days  . Minutes of Exercise per Session: 0 min  Stress: No Stress Concern  Present  . Feeling of Stress : Not at all  Social Connections:   . Frequency of Communication with Friends and Family: Not on file  . Frequency of Social Gatherings with Friends and Family: Not on file  . Attends Religious Services: Not on file  . Active Member of Clubs or Organizations: Not on file  . Attends Archivist Meetings: Not on file  . Marital Status: Not on file  Intimate Partner Violence:   . Fear of Current or Ex-Partner: Not on file  . Emotionally Abused: Not on file  . Physically Abused: Not on file  . Sexually Abused: Not on file     ROS- All systems are reviewed and negative except as per the HPI above.  Physical Exam: Vitals:   06/07/20 1022  BP: (!) 148/80  Pulse: 65  Weight: 63.4 kg  Height: 5\' 4"  (1.626 m)    GEN- The patient is well appearing elderly male, alert and oriented x 3 today.   Head- normocephalic, atraumatic Eyes-  Sclera clear, conjunctiva pink Ears- hearing intact Oropharynx- clear Neck- supple  Lungs- Clear to ausculation bilaterally, normal work of breathing Heart- Regular rate and rhythm, no murmurs, rubs or gallops  GI- soft, NT, ND, + BS Extremities- no clubbing, cyanosis, or edema MS- no significant deformity or atrophy Skin- no rash or lesion Psych- euthymic mood, full affect Neuro- strength and sensation are intact  Wt Readings from Last 3 Encounters:  06/07/20 63.4 kg  03/12/20 61.4 kg  11/07/19 62.3 kg    EKG today demonstrates SR HR 65, 1st degree AV block, RBBB, PR 278, QRS 124, QTc 499  Echo 05/20/19 demonstrated  1. Left ventricular ejection fraction, by visual estimation, is 55 to  60%. The left ventricle has normal function. Normal left ventricular size.  There is severely increased left ventricular hypertrophy.  2. Left ventricular diastolic Doppler parameters are consistent with  impaired relaxation pattern of LV diastolic filling.  3. Global right ventricle has normal systolic function.The right   ventricular size is normal. No increase in right ventricular wall  thickness.  4. Left atrial size was normal.  5. Right atrial size was normal.  6. The mitral valve is normal in structure. Mild mitral valve  regurgitation. No evidence of mitral stenosis.  7. The tricuspid valve is normal in structure. Tricuspid valve  regurgitation was not visualized by color flow Doppler.  8. The aortic valve is normal in structure. Aortic valve regurgitation is  mild by color flow Doppler. Mild to moderate aortic valve  sclerosis/calcification without any evidence of aortic stenosis.  9. The pulmonic valve was normal in structure. Pulmonic valve  regurgitation is not visualized by color flow Doppler.  10. The inferior vena cava is normal in size with greater than 50%  respiratory variability, suggesting right atrial pressure of 3 mmHg.   Epic records are reviewed at length today  CHA2DS2-VASc Score = 6  The patient's score is based upon: CHF History: 0 HTN History: 1 Diabetes History: 0 Stroke History: 2 Vascular Disease History: 1 Age Score: 2 Gender Score: 0      ASSESSMENT AND PLAN: 1. Paroxysmal Atrial Fibrillation (ICD10:  I48.0) The patient's CHA2DS2-VASc score is 6, indicating a 9.7% annual risk of stroke.   General education about afib provided and questions answered. We also discussed his stroke risk and the risks and benefits of anticoagulation. Will start Eliquis 2.5 mg (age, Cr >1.5). Will stop Plavix Check cbc/bmet today. Will not add rate control at this time as his rates were well controlled on ILR.  2. Secondary Hypercoagulable State (ICD10:  D68.69) The patient is at significant risk for stroke/thromboembolism based upon his CHA2DS2-VASc Score of 6.  Start Apixaban (Eliquis).   3. HTN Stable, no changes today.   Follow up in the AF clinic in one month.    [facility] [address] [place], [place]  [id number] [phone] 06/07/2020 1:25 PM

## 2020-06-17 ENCOUNTER — Ambulatory Visit (INDEPENDENT_AMBULATORY_CARE_PROVIDER_SITE_OTHER): Payer: Medicare Other

## 2020-06-17 DIAGNOSIS — I639 Cerebral infarction, unspecified: Secondary | ICD-10-CM | POA: Diagnosis not present

## 2020-06-19 LAB — CUP PACEART REMOTE DEVICE CHECK
Date Time Interrogation Session: 20211107230606
Implantable Pulse Generator Implant Date: 20201014

## 2020-06-19 NOTE — Progress Notes (Signed)
Carelink Summary Report / Loop Recorder 

## 2020-07-12 ENCOUNTER — Other Ambulatory Visit: Payer: Self-pay

## 2020-07-12 ENCOUNTER — Encounter: Payer: Self-pay | Admitting: Podiatry

## 2020-07-12 ENCOUNTER — Ambulatory Visit (INDEPENDENT_AMBULATORY_CARE_PROVIDER_SITE_OTHER): Payer: Medicare Other | Admitting: Podiatry

## 2020-07-12 DIAGNOSIS — I739 Peripheral vascular disease, unspecified: Secondary | ICD-10-CM

## 2020-07-12 DIAGNOSIS — M79674 Pain in right toe(s): Secondary | ICD-10-CM

## 2020-07-12 DIAGNOSIS — B351 Tinea unguium: Secondary | ICD-10-CM | POA: Diagnosis not present

## 2020-07-12 DIAGNOSIS — M79675 Pain in left toe(s): Secondary | ICD-10-CM | POA: Diagnosis not present

## 2020-07-12 DIAGNOSIS — N183 Chronic kidney disease, stage 3 unspecified: Secondary | ICD-10-CM

## 2020-07-12 NOTE — Progress Notes (Signed)
This patient returns to my office for at risk foot care.  This patient requires this care by a professional since this patient will be at risk due to having chronic kidney disease and coagulation defect.  Patient is taking plavix. Patient presents to the office with hisnfemale caregiver.  This patient is unable to cut nails himself since the patient cannot reach his nails.These nails are painful walking and wearing shoes.  This patient presents for at risk foot care today.  General Appearance  Alert, conversant and in no acute stress.  Vascular  Dorsalis pedis and posterior tibial  pulses are weakly  palpable  bilaterally.  Capillary return is within normal limits  bilaterally. Temperature is within normal limits  bilaterally.  Neurologic  Senn-Weinstein monofilament wire test within normal limits  bilaterally. Muscle power within normal limits bilaterally.  Nails Thick disfigured discolored nails with subungual debris  from hallux to fifth toes bilaterally. No evidence of bacterial infection or drainage bilaterally.  Orthopedic  No limitations of motion  feet .  No crepitus or effusions noted.  No bony pathology or digital deformities noted.  HAV  B/L.  Skin  normotropic skin with no porokeratosis noted bilaterally.  No signs of infections or ulcers noted.     Onychomycosis  Pain in right toes  Pain in left toes  Consent was obtained for treatment procedures.   Mechanical debridement of nails 1-5  bilaterally performed with a nail nipper.  Filed with dremel without incident.    Return office visit     10  weeks                Told patient to return for periodic foot care and evaluation due to potential at risk complications.   Gardiner Barefoot DPM

## 2020-07-18 ENCOUNTER — Ambulatory Visit (INDEPENDENT_AMBULATORY_CARE_PROVIDER_SITE_OTHER): Payer: Medicare Other

## 2020-07-18 DIAGNOSIS — I639 Cerebral infarction, unspecified: Secondary | ICD-10-CM | POA: Diagnosis not present

## 2020-07-18 NOTE — Progress Notes (Signed)
Primary Care Physician: [name], [place] Primary Cardiologist: Dr Aundra Dubin (remotely) Primary Electrophysiologist: Dr Curt Bears  Referring Physician: Dr Elin[facility]   Trevor Murphy is a 85 y.o. male with a history of SVT, non obstructive CAD, chronic diastolic CHF, anemia, CKD stage III, prior CVA, HTN, and paroxysmal atrial fibrillation who presents for follow up in the [facility]. The patient was initially diagnosed with atrial fibrillation 06/06/20 on ILR following a cryptogenic stroke in 05/2019. Patient was asymptomatic and rate controlled during the episodes. Some appear to be true afib and others SR with PACs. All < 10 minutes. Patient has a CHADS2VASC score of 6.   On follow up today, patient reports he has done well since his last visit. He denies any heart racing or palpitations. ILR shows no new episodes of afib. He denies any bleeding issues on anticoagulation. Denies any black, tarry stools. He does admit he has not been taking his iron supplement.   Today, he denies symptoms of palpitations, chest pain, shortness of breath, orthopnea, PND, lower extremity edema, dizziness, presyncope, syncope, snoring, daytime somnolence, bleeding, or neurologic sequela. The patient is tolerating medications without difficulties and is otherwise without complaint today.    Atrial Fibrillation Risk Factors:  he does not have symptoms or diagnosis of sleep apnea. he does not have a history of rheumatic fever. he does not have a history of alcohol use.   he has a BMI of Body mass index is 23.34 kg/m.[name] Kitchen Filed Weights   07/19/20 1029  Weight: 61.7 kg    Family History  Problem Relation Age of Onset  . CVA Mother   . Hypertension Brother   . Hypertension Sister   . Hypertension Brother   . Stroke Sister   . Heart attack Neg Hx      Atrial Fibrillation Management history:  Previous antiarrhythmic drugs: none Previous cardioversions:  none Previous ablations: none CHADS2VASC score: 6 Anticoagulation history: Eliquis   Past Medical History:  Diagnosis Date  . Arthritis   . CAD (coronary artery disease)    a. Cath 08/2009: minimal nonobstructive disease (25% LAD). b. Lexiscan nuc 6/215: EF 54%, no ischemia or infarction.  . Chronic diastolic CHF (congestive heart failure) ([place])    a. Echo 2013: EF 55-60%, mild AI, mild MR. b. Echo 5/15 with EF 60-65%, no WMA, mild LVH, mild AI.  [name] Kitchen CKD (chronic kidney disease) stage 3, GFR 30-59 ml/min (HCC) 07/26/2012   a. Felt likely due to HTN.  . CVA (cerebral infarction) 07/28/2012  . Glaucoma   . Gout   . H/O umbilical hernia repair   . Headache(784.0)    " WHEN MY BLOOD PRESSURE IS UP"  . History of acute renal failure   . History of alcohol abuse   . HTN (hypertension)   . Hypertension    a. Patent renal arteries 2005.  [name] Kitchen Noncompliance   . OA (osteoarthritis)   . Other and unspecified hyperlipidemia   . Poor circulation   . Sinus bradycardia   . SVT (supraventricular tachycardia) ([place])    a. Possible AVNRT.  Got adenosine from EMS in 2015 for SVT.  During 5/15 hospitalization had episode of short R-P tachycardia (possible AVNRT) terminated by adenosine.  b. 04/2016 - recurrent tachycardia (SVT vs. AVNRT), again terminated by Adenosine  . Visual impairment    Past Surgical History:  Procedure Laterality Date  . APPENDECTOMY    . BUBBLE STUDY  05/24/2019   Procedure: BUBBLE STUDY;  Surgeon: Donato Heinz, MD;  Location: [facility] ENDOSCOPY;  Service: Endoscopy;;  . CARDIAC CATHETERIZATION  11/2003   EF 50-55%  . CARDIAC CATHETERIZATION  08/2009  . CATARACT EXTRACTION EXTRACAPSULAR Right 05/17/2013   Procedure: CATARACT EXTRACTION EXTRACAPSULAR WITH INTRAOCULAR LENS PLACEMENT (IOC) RIGHT EYE;  Surgeon: Marylynn Pearson, MD;  Location: [place] [facility];  Service: Ophthalmology;  Laterality: Right;  . COLONOSCOPY    . HERNIA REPAIR    . LOOP RECORDER INSERTION N/A 05/24/2019    Procedure: LOOP RECORDER INSERTION;  Surgeon: Constance Haw, MD;  Location: [place] CV LAB;  Service: Cardiovascular;  Laterality: N/A;  . TEE WITHOUT CARDIOVERSION N/A 05/24/2019   Procedure: TRANSESOPHAGEAL ECHOCARDIOGRAM (TEE);  Surgeon: Donato Heinz, MD;  Location: [facility] ENDOSCOPY;  Service: Endoscopy;  Laterality: N/A;  . VENTRAL HERNIA REPAIR  03/07/2012   Procedure: LAPAROSCOPIC VENTRAL HERNIA;  Surgeon: Gwenyth Ober, MD;  Location: [place];  Service: General;  Laterality: N/A;    Current Outpatient Medications  Medication Sig Dispense Refill  . apixaban (ELIQUIS) 2.5 MG TABS tablet Take 1 tablet (2.5 mg total) by mouth 2 (two) times daily. 60 tablet 3  . atorvastatin (LIPITOR) 80 MG tablet Take 1 tablet (80 mg total) by mouth daily at 6 PM. (Patient not taking: Reported on 07/19/2020) 90 tablet 1  . ferrous sulfate 325 (65 FE) MG tablet Take 1 tablet (325 mg total) by mouth daily. (Patient not taking: Reported on 07/19/2020) 90 tablet 1  . LUMIGAN 0.01 % SOLN  (Patient not taking: Reported on 07/19/2020)     No current facility-administered medications for this encounter.    No Known Allergies  Social History   Socioeconomic History  . Marital status: Married    Spouse name: Not on file  . Number of children: Not on file  . Years of education: Not on file  . Highest education level: Not on file  Occupational History  . Occupation: retired  Tobacco Use  . Smoking status: Never Smoker  . Smokeless tobacco: Never Used  Vaping Use  . Vaping Use: Never used  Substance and Sexual Activity  . Alcohol use: No    Alcohol/week: 1.0 standard drink    Types: 1 Cans of beer per week    Comment: h/o heavy use, quit in 2017  . Drug use: No  . Sexual activity: Not Currently  Other Topics Concern  . Not on file  Social History Narrative  . Not on file   Social Determinants of Health   Financial Resource Strain: Low Risk   . Difficulty of Paying Living  Expenses: Not hard at all  Food Insecurity: No Food Insecurity  . Worried About Charity fundraiser in the Last Year: Never true  . Ran Out of Food in the Last Year: Never true  Transportation Needs: No Transportation Needs  . Lack of Transportation (Medical): No  . Lack of Transportation (Non-Medical): No  Physical Activity: Inactive  . Days of Exercise per Week: 0 days  . Minutes of Exercise per Session: 0 min  Stress: No Stress Concern Present  . Feeling of Stress : Not at all  Social Connections: Not on file  Intimate Partner Violence: Not on file     ROS- All systems are reviewed and negative except as per the HPI above.  Physical Exam: Vitals:   07/19/20 1029  BP: (!) 150/70  Pulse: (!) 50  Weight: 61.7 kg  Height: 5\' 4"  (1.626 m)    GEN- The  patient is well appearing elderly male, alert and oriented x 3 today.   HEENT-head normocephalic, atraumatic, sclera clear, conjunctiva pink, hearing intact, trachea midline. Lungs- Clear to ausculation bilaterally, normal work of breathing Heart- Regular rate and rhythm, bradycardia, no murmurs, rubs or gallops  GI- soft, NT, ND, + BS Extremities- no clubbing, cyanosis, or edema MS- no significant deformity or atrophy Skin- no rash or lesion Psych- euthymic mood, full affect Neuro- strength and sensation are intact   Wt Readings from Last 3 Encounters:  07/19/20 61.7 kg  06/07/20 63.4 kg  03/12/20 61.4 kg    EKG today demonstrates SB HR 50, RBBB, 1st degree AV block, PVC, PR 264, QRS 128, QTc 450  Echo 05/20/19 demonstrated  1. Left ventricular ejection fraction, by visual estimation, is 55 to  60%. The left ventricle has normal function. Normal left ventricular size.  There is severely increased left ventricular hypertrophy.  2. Left ventricular diastolic Doppler parameters are consistent with  impaired relaxation pattern of LV diastolic filling.  3. Global right ventricle has normal systolic function.The right   ventricular size is normal. No increase in right ventricular wall  thickness.  4. Left atrial size was normal.  5. Right atrial size was normal.  6. The mitral valve is normal in structure. Mild mitral valve  regurgitation. No evidence of mitral stenosis.  7. The tricuspid valve is normal in structure. Tricuspid valve  regurgitation was not visualized by color flow Doppler.  8. The aortic valve is normal in structure. Aortic valve regurgitation is  mild by color flow Doppler. Mild to moderate aortic valve  sclerosis/calcification without any evidence of aortic stenosis.  9. The pulmonic valve was normal in structure. Pulmonic valve  regurgitation is not visualized by color flow Doppler.  10. The inferior vena cava is normal in size with greater than 50%  respiratory variability, suggesting right atrial pressure of 3 mmHg.   Epic records are reviewed at length today  CHA2DS2-VASc Score = 6  The patient's score is based upon: CHF History: No HTN History: Yes Diabetes History: No Stroke History: Yes Vascular Disease History: Yes Age Score: 2 Gender Score: 0      ASSESSMENT AND PLAN: 1. Paroxysmal Atrial Fibrillation (ICD10:  I48.0) The patient's CHA2DS2-VASc score is 6, indicating a 9.7% annual risk of stroke.   No new episodes of afib on ILR Continue Eliquis 2.5 mg (age, Cr >1.5) Check CBC today. Encouraged him to resume his iron supplement.   2. Secondary Hypercoagulable State (ICD10:  D68.69) The patient is at significant risk for stroke/thromboembolism based upon his CHA2DS2-VASc Score of 6.  Continue Apixaban (Eliquis).   3. HTN Stable, no changes today.   Follow up in the AF clinic in 3 months.    [facility] [address] [place], [place] [address] [phone] 07/19/2020 10:41 AM

## 2020-07-19 ENCOUNTER — Encounter (HOSPITAL_COMMUNITY): Payer: Self-pay | Admitting: Physician Assistant

## 2020-07-19 ENCOUNTER — Other Ambulatory Visit: Payer: Self-pay

## 2020-07-19 ENCOUNTER — Ambulatory Visit (HOSPITAL_COMMUNITY)
Admission: RE | Admit: 2020-07-19 | Discharge: 2020-07-19 | Disposition: A | Discharge status: Home / Self Care | Payer: Medicare Other | Source: Ambulatory Visit | Attending: Physician Assistant | Admitting: Physician Assistant

## 2020-07-19 VITALS — BP 150/70 | HR 50 | Ht 64.0 in | Wt 136.0 lb

## 2020-07-19 DIAGNOSIS — I5032 Chronic diastolic (congestive) heart failure: Secondary | ICD-10-CM | POA: Diagnosis not present

## 2020-07-19 DIAGNOSIS — D6869 Other thrombophilia: Secondary | ICD-10-CM | POA: Insufficient documentation

## 2020-07-19 DIAGNOSIS — I480 Paroxysmal atrial fibrillation: Secondary | ICD-10-CM | POA: Diagnosis not present

## 2020-07-19 DIAGNOSIS — I2510 Atherosclerotic heart disease of native coronary artery without angina pectoris: Secondary | ICD-10-CM | POA: Insufficient documentation

## 2020-07-19 DIAGNOSIS — Z8673 Personal history of transient ischemic attack (TIA), and cerebral infarction without residual deficits: Secondary | ICD-10-CM | POA: Diagnosis not present

## 2020-07-19 DIAGNOSIS — Z7901 Long term (current) use of anticoagulants: Secondary | ICD-10-CM | POA: Diagnosis not present

## 2020-07-19 DIAGNOSIS — Z8249 Family history of ischemic heart disease and other diseases of the circulatory system: Secondary | ICD-10-CM | POA: Diagnosis not present

## 2020-07-19 DIAGNOSIS — D631 Anemia in chronic kidney disease: Secondary | ICD-10-CM | POA: Insufficient documentation

## 2020-07-19 DIAGNOSIS — N183 Chronic kidney disease, stage 3 unspecified: Secondary | ICD-10-CM | POA: Insufficient documentation

## 2020-07-19 DIAGNOSIS — I130 Hypertensive heart and chronic kidney disease with heart failure and stage 1 through stage 4 chronic kidney disease, or unspecified chronic kidney disease: Secondary | ICD-10-CM | POA: Insufficient documentation

## 2020-07-19 DIAGNOSIS — Z79899 Other long term (current) drug therapy: Secondary | ICD-10-CM | POA: Insufficient documentation

## 2020-07-19 LAB — CBC
HCT: 26.2 % — ABNORMAL LOW (ref 39.0–52.0)
Hemoglobin: 7.4 g/dL — ABNORMAL LOW (ref 13.0–17.0)
MCH: 21.1 pg — ABNORMAL LOW (ref 26.0–34.0)
MCHC: 28.2 g/dL — ABNORMAL LOW (ref 30.0–36.0)
MCV: 74.9 fL — ABNORMAL LOW (ref 80.0–100.0)
Platelets: 109 10*3/uL — ABNORMAL LOW (ref 150–400)
RBC: 3.5 MIL/uL — ABNORMAL LOW (ref 4.22–5.81)
RDW: 19.6 % — ABNORMAL HIGH (ref 11.5–15.5)
WBC: 4 10*3/uL (ref 4.0–10.5)
nRBC: 0 % (ref 0.0–0.2)

## 2020-07-19 MED ORDER — APIXABAN 2.5 MG PO TABS
2.5000 mg | ORAL_TABLET | Freq: Two times a day (BID) | ORAL | 11 refills | Status: DC
Start: 2020-07-19 — End: 2020-11-14

## 2020-07-20 LAB — CUP PACEART REMOTE DEVICE CHECK
Date Time Interrogation Session: 20211210230127
Implantable Pulse Generator Implant Date: 20201014

## 2020-07-31 NOTE — Progress Notes (Signed)
Carelink Summary Report / Loop Recorder 

## 2020-08-05 ENCOUNTER — Telehealth: Payer: Self-pay | Admitting: Nurse Practitioner

## 2020-08-05 NOTE — Telephone Encounter (Signed)
Left message for patient to call back and schedule Medicare Annual Wellness Visit (AWV) in office.   Last AWV 08/08/2019  please schedule at anytime with [facility]    This should be a 45 minute visit.

## 2020-08-08 ENCOUNTER — Other Ambulatory Visit: Payer: Self-pay

## 2020-08-08 ENCOUNTER — Ambulatory Visit (INDEPENDENT_AMBULATORY_CARE_PROVIDER_SITE_OTHER): Payer: Medicare Other | Admitting: Nurse Practitioner

## 2020-08-08 ENCOUNTER — Encounter: Payer: Self-pay | Admitting: Nurse Practitioner

## 2020-08-08 VITALS — BP 110/72 | HR 72 | Temp 97.9°F | Ht 64.0 in | Wt 129.4 lb

## 2020-08-08 DIAGNOSIS — Z23 Encounter for immunization: Secondary | ICD-10-CM

## 2020-08-08 DIAGNOSIS — I10 Essential (primary) hypertension: Secondary | ICD-10-CM | POA: Diagnosis not present

## 2020-08-08 DIAGNOSIS — I129 Hypertensive chronic kidney disease with stage 1 through stage 4 chronic kidney disease, or unspecified chronic kidney disease: Secondary | ICD-10-CM | POA: Diagnosis not present

## 2020-08-08 DIAGNOSIS — N183 Chronic kidney disease, stage 3 unspecified: Secondary | ICD-10-CM | POA: Diagnosis not present

## 2020-08-08 DIAGNOSIS — R7303 Prediabetes: Secondary | ICD-10-CM

## 2020-08-08 DIAGNOSIS — D509 Iron deficiency anemia, unspecified: Secondary | ICD-10-CM | POA: Diagnosis not present

## 2020-08-08 DIAGNOSIS — Z2821 Immunization not carried out because of patient refusal: Secondary | ICD-10-CM

## 2020-08-08 LAB — POCT UA - MICROALBUMIN
Albumin/Creatinine Ratio, Urine, POC: >300
Creatinine, POC: 50 mg/dL
Microalbumin Ur, POC: 80 mg/L

## 2020-08-08 MED ORDER — PNEUMOCOCCAL 13-VAL CONJ VACC IM SUSP: 0.5000 mL | INTRAMUSCULAR | 0 refills | Status: AC

## 2020-08-08 NOTE — Progress Notes (Signed)
I,Yamilka Roman [employer] as a Education administrator for [employer], FNP.,have documented all relevant documentation on the behalf of [name], FNP,as directed by  [name], FNP while in the presence of [name], [place]. This visit occurred during the SARS-CoV-2 public health emergency.  Safety protocols were in place, including screening questions prior to the visit, additional usage of staff PPE, and extensive cleaning of exam room while observing appropriate contact time as indicated for disinfecting solutions.  Subjective:     Patient ID: Trevor Murphy , male    DOB: [birth year] , 85 y.o.   MRN: [id number]   Chief Complaint  Patient presents with  . Hypertension  . Anemia    HPI  Here for iron follow up he was seen at the heart doctor his Hgb was 7.4. he denies any symptoms. He was advised by the Cardiologist to go to the ER for a transfusion however he did not want to do this at that time. He does continue taking Eliquis as directed by cardiology.     Past Medical History:  Diagnosis Date  . Arthritis   . CAD (coronary artery disease)    a. Cath 08/2009: minimal nonobstructive disease (25% LAD). b. Lexiscan nuc 6/215: EF 54%, no ischemia or infarction.  . Chronic diastolic CHF (congestive heart failure) ([place])    a. Echo 2013: EF 55-60%, mild AI, mild MR. b. Echo 5/15 with EF 60-65%, no WMA, mild LVH, mild AI.  [name] Kitchen CKD (chronic kidney disease) stage 3, GFR 30-59 ml/min (HCC) 07/26/2012   a. Felt likely due to HTN.  . CVA (cerebral infarction) 07/28/2012  . Glaucoma   . Gout   . H/O umbilical hernia repair   . Headache(784.0)    " WHEN MY BLOOD PRESSURE IS UP"  . History of acute renal failure   . History of alcohol abuse   . HTN (hypertension)   . Hypertension    a. Patent renal arteries 2005.  [name] Kitchen Noncompliance   . OA (osteoarthritis)   . Other and unspecified hyperlipidemia   . Poor circulation   . Sinus bradycardia   . SVT (supraventricular tachycardia) ([place])    a.  Possible AVNRT.  Got adenosine from EMS in 2015 for SVT.  During 5/15 hospitalization had episode of short R-P tachycardia (possible AVNRT) terminated by adenosine.  b. 04/2016 - recurrent tachycardia (SVT vs. AVNRT), again terminated by Adenosine  . Visual impairment      Family History  Problem Relation Age of Onset  . CVA Mother   . Hypertension Brother   . Hypertension Sister   . Hypertension Brother   . Stroke Sister   . Heart attack Neg Hx      Current Outpatient Medications:  .  apixaban (ELIQUIS) 2.5 MG TABS tablet, Take 1 tablet (2.5 mg total) by mouth 2 (two) times daily. (Patient not taking: Reported on 08/23/2020), Disp: 60 tablet, Rfl: 11 .  atorvastatin (LIPITOR) 80 MG tablet, Take 1 tablet (80 mg total) by mouth daily at 6 PM., Disp: 90 tablet, Rfl: 1 .  ferrous sulfate 325 (65 FE) MG tablet, Take 1 tablet (325 mg total) by mouth daily., Disp: 90 tablet, Rfl: 1 .  LUMIGAN 0.01 % SOLN, Place 1 drop into both eyes at bedtime., Disp: , Rfl:  .  pantoprazole (PROTONIX) 40 MG tablet, Take 1 tablet (40 mg total) by mouth 2 (two) times daily., Disp: 60 tablet, Rfl: 0   No Known Allergies   Review of Systems  Constitutional: Negative.   Respiratory: Negative.   Cardiovascular: Negative.  Negative for chest pain, palpitations and leg swelling.  Psychiatric/Behavioral: Negative.      Today's Vitals   08/08/20 1203  BP: 110/72  Pulse: 72  Temp: 97.9 F (36.6 C)  TempSrc: Oral  Weight: 129 lb 6.4 oz (58.7 kg)  Height: 5' 4"  (1.626 m)  PainSc: 0-No pain   Body mass index is 22.21 kg/m.   Objective:  Physical Exam Constitutional:      General: He is not in acute distress.    Appearance: Normal appearance.  Cardiovascular:     Rate and Rhythm: Normal rate and regular rhythm.     Pulses: Normal pulses.     Heart sounds: Normal heart sounds. No murmur heard.   Pulmonary:     Effort: Pulmonary effort is normal. No respiratory distress.     Breath sounds: Normal  breath sounds. No wheezing.  Skin:    Capillary Refill: Capillary refill takes less than 2 seconds.  Neurological:     General: No focal deficit present.     Mental Status: He is alert.     Cranial Nerves: No cranial nerve deficit.  Psychiatric:        Mood and Affect: Mood normal.        Behavior: Behavior normal.        Thought Content: Thought content normal.        Judgment: Judgment normal.         Assessment And Plan:     1. Iron deficiency anemia, unspecified iron deficiency anemia type  I have explained to him and his sister that he may need an iron transfusion or blood transfusion based on his last Hgb done at Cardiology. He had been hesitant as did not feel anything was wrong. Will make referral to Hematology and check iron studies.  - Ambulatory referral to Hematology - Iron, TIBC and Ferritin Panel  2. Essential hypertension  Chronic, blood pressure is well controlled - BMP8+eGFR  3. Prediabetes  Chronic, stable  No current medications  4. Stage 3 chronic kidney disease, unspecified whether stage 3a or 3b CKD ([place])  5. Influenza vaccination declined Patient declined influenza vaccination at this time. Patient is aware that influenza vaccine prevents illness in 70% of healthy people, and reduces hospitalizations to 30-70% in elderly. This vaccine is recommended annually. Pt is willing to accept risk associated with refusing vaccination.  6. COVID-19 vaccination declined Declines covid 19 vaccine. Discussed risk of covid 85 and if he changes her mind about the vaccine to call the office.  Encouraged to take multivitamin, vitamin d, vitamin c and zinc to increase immune system. Aware can call office if would like to have vaccine here at office.   7. Encounter for immunization - pneumococcal 13-valent conjugate vaccine (PREVNAR 13) SUSP injection; Inject 0.5 mLs into the muscle tomorrow at 10 am for 1 dose.  Dispense: 0.5 mL; Refill: 0     Patient was given  opportunity to ask questions. Patient verbalized understanding of the plan and was able to repeat key elements of the plan. All questions were answered to their satisfaction.  [name], FNP    I, [name], FNP, have reviewed all documentation for this visit. The documentation on 08/08/20 for the exam, diagnosis, procedures, and orders are all accurate and complete.   THE PATIENT IS ENCOURAGED TO PRACTICE SOCIAL DISTANCING DUE TO THE COVID-19 PANDEMIC.

## 2020-08-09 LAB — BMP8+EGFR
BUN/Creatinine Ratio: 12 (ref 10–24)
BUN: 21 mg/dL (ref 8–27)
CO2: 19 mmol/L — ABNORMAL LOW (ref 20–29)
Calcium: 7.9 mg/dL — ABNORMAL LOW (ref 8.6–10.2)
Chloride: 108 mmol/L — ABNORMAL HIGH (ref 96–106)
Creatinine, Ser: 1.79 mg/dL — ABNORMAL HIGH (ref 0.76–1.27)
GFR calc Af Amer: 40 mL/min/{1.73_m2} — ABNORMAL LOW (ref >59)
GFR calc non Af Amer: 34 mL/min/{1.73_m2} — ABNORMAL LOW (ref >59)
Glucose: 100 mg/dL — ABNORMAL HIGH (ref 65–99)
Potassium: 5.4 mmol/L — ABNORMAL HIGH (ref 3.5–5.2)
Sodium: 141 mmol/L (ref 134–144)

## 2020-08-09 LAB — IRON,TIBC AND FERRITIN PANEL
Ferritin: 47 ng/mL (ref 30–400)
Iron Saturation: 86 % (ref 15–55)
Iron: 270 ug/dL (ref 38–169)
Total Iron Binding Capacity: 314 ug/dL (ref 250–450)
UIBC: 44 ug/dL — ABNORMAL LOW (ref 111–343)

## 2020-08-11 ENCOUNTER — Other Ambulatory Visit: Payer: Self-pay

## 2020-08-11 ENCOUNTER — Emergency Department (HOSPITAL_COMMUNITY): Payer: Medicare Other

## 2020-08-11 ENCOUNTER — Inpatient Hospital Stay (HOSPITAL_COMMUNITY)
Admission: EM | Admit: 2020-08-11 | Discharge: 2020-08-15 | DRG: 392 | Disposition: A | Discharge status: Home Health | Payer: Medicare Other | Attending: Internal Medicine | Admitting: Internal Medicine

## 2020-08-11 DIAGNOSIS — M109 Gout, unspecified: Secondary | ICD-10-CM | POA: Diagnosis present

## 2020-08-11 DIAGNOSIS — R778 Other specified abnormalities of plasma proteins: Secondary | ICD-10-CM | POA: Diagnosis not present

## 2020-08-11 DIAGNOSIS — R195 Other fecal abnormalities: Secondary | ICD-10-CM | POA: Diagnosis not present

## 2020-08-11 DIAGNOSIS — K269 Duodenal ulcer, unspecified as acute or chronic, without hemorrhage or perforation: Secondary | ICD-10-CM | POA: Diagnosis not present

## 2020-08-11 DIAGNOSIS — Z79899 Other long term (current) drug therapy: Secondary | ICD-10-CM

## 2020-08-11 DIAGNOSIS — E782 Mixed hyperlipidemia: Secondary | ICD-10-CM | POA: Diagnosis present

## 2020-08-11 DIAGNOSIS — K267 Chronic duodenal ulcer without hemorrhage or perforation: Secondary | ICD-10-CM | POA: Diagnosis present

## 2020-08-11 DIAGNOSIS — Z7901 Long term (current) use of anticoagulants: Secondary | ICD-10-CM | POA: Diagnosis not present

## 2020-08-11 DIAGNOSIS — Z20822 Contact with and (suspected) exposure to covid-19: Secondary | ICD-10-CM | POA: Diagnosis not present

## 2020-08-11 DIAGNOSIS — K319 Disease of stomach and duodenum, unspecified: Principal | ICD-10-CM | POA: Diagnosis present

## 2020-08-11 DIAGNOSIS — I2510 Atherosclerotic heart disease of native coronary artery without angina pectoris: Secondary | ICD-10-CM | POA: Diagnosis present

## 2020-08-11 DIAGNOSIS — Z8673 Personal history of transient ischemic attack (TIA), and cerebral infarction without residual deficits: Secondary | ICD-10-CM | POA: Diagnosis not present

## 2020-08-11 DIAGNOSIS — R079 Chest pain, unspecified: Secondary | ICD-10-CM

## 2020-08-11 DIAGNOSIS — N1832 Chronic kidney disease, stage 3b: Secondary | ICD-10-CM | POA: Diagnosis present

## 2020-08-11 DIAGNOSIS — I130 Hypertensive heart and chronic kidney disease with heart failure and stage 1 through stage 4 chronic kidney disease, or unspecified chronic kidney disease: Secondary | ICD-10-CM | POA: Diagnosis not present

## 2020-08-11 DIAGNOSIS — D62 Acute posthemorrhagic anemia: Secondary | ICD-10-CM | POA: Diagnosis not present

## 2020-08-11 DIAGNOSIS — K2970 Gastritis, unspecified, without bleeding: Secondary | ICD-10-CM | POA: Diagnosis present

## 2020-08-11 DIAGNOSIS — N183 Chronic kidney disease, stage 3 unspecified: Secondary | ICD-10-CM | POA: Diagnosis present

## 2020-08-11 DIAGNOSIS — D509 Iron deficiency anemia, unspecified: Secondary | ICD-10-CM | POA: Diagnosis not present

## 2020-08-11 DIAGNOSIS — K921 Melena: Secondary | ICD-10-CM | POA: Diagnosis not present

## 2020-08-11 DIAGNOSIS — K922 Gastrointestinal hemorrhage, unspecified: Secondary | ICD-10-CM | POA: Diagnosis present

## 2020-08-11 DIAGNOSIS — I480 Paroxysmal atrial fibrillation: Secondary | ICD-10-CM | POA: Diagnosis not present

## 2020-08-11 DIAGNOSIS — I10 Essential (primary) hypertension: Secondary | ICD-10-CM | POA: Diagnosis present

## 2020-08-11 DIAGNOSIS — R7989 Other specified abnormal findings of blood chemistry: Secondary | ICD-10-CM | POA: Diagnosis present

## 2020-08-11 DIAGNOSIS — I5032 Chronic diastolic (congestive) heart failure: Secondary | ICD-10-CM | POA: Diagnosis not present

## 2020-08-11 DIAGNOSIS — K2980 Duodenitis without bleeding: Secondary | ICD-10-CM | POA: Diagnosis not present

## 2020-08-11 DIAGNOSIS — J9811 Atelectasis: Secondary | ICD-10-CM | POA: Diagnosis not present

## 2020-08-11 DIAGNOSIS — H409 Unspecified glaucoma: Secondary | ICD-10-CM | POA: Diagnosis not present

## 2020-08-11 DIAGNOSIS — D649 Anemia, unspecified: Secondary | ICD-10-CM | POA: Diagnosis not present

## 2020-08-11 HISTORY — DX: Acute posthemorrhagic anemia: D62

## 2020-08-11 LAB — BASIC METABOLIC PANEL
Anion gap: 11 (ref 5–15)
BUN: 25 mg/dL — ABNORMAL HIGH (ref 8–23)
CO2: 16 mmol/L — ABNORMAL LOW (ref 22–32)
Calcium: 7.8 mg/dL — ABNORMAL LOW (ref 8.9–10.3)
Chloride: 112 mmol/L — ABNORMAL HIGH (ref 98–111)
Creatinine, Ser: 2.14 mg/dL — ABNORMAL HIGH (ref 0.61–1.24)
GFR, Estimated: 30 mL/min — ABNORMAL LOW (ref >60)
Glucose, Bld: 126 mg/dL — ABNORMAL HIGH (ref 70–99)
Potassium: 4.4 mmol/L (ref 3.5–5.1)
Sodium: 139 mmol/L (ref 135–145)

## 2020-08-11 LAB — CBC
HCT: 22.8 % — ABNORMAL LOW (ref 39.0–52.0)
Hemoglobin: 6.3 g/dL — CL (ref 13.0–17.0)
MCH: 20.7 pg — ABNORMAL LOW (ref 26.0–34.0)
MCHC: 27.6 g/dL — ABNORMAL LOW (ref 30.0–36.0)
MCV: 75 fL — ABNORMAL LOW (ref 80.0–100.0)
Platelets: 178 10*3/uL (ref 150–400)
RBC: 3.04 MIL/uL — ABNORMAL LOW (ref 4.22–5.81)
RDW: 21.1 % — ABNORMAL HIGH (ref 11.5–15.5)
WBC: 4.4 10*3/uL (ref 4.0–10.5)
nRBC: 0.7 % — ABNORMAL HIGH (ref 0.0–0.2)

## 2020-08-11 LAB — TROPONIN I (HIGH SENSITIVITY)
Troponin I (High Sensitivity): 21 ng/L — ABNORMAL HIGH (ref <18)
Troponin I (High Sensitivity): 19 ng/L — ABNORMAL HIGH (ref <18)

## 2020-08-11 LAB — POC OCCULT BLOOD, ED: Fecal Occult Bld: POSITIVE — AB

## 2020-08-11 MED ORDER — PANTOPRAZOLE SODIUM 40 MG IV SOLR
40.0000 mg | Freq: Once | INTRAVENOUS | Status: AC
  Administered 2020-08-11: 40 mg via INTRAVENOUS
  Filled 2020-08-11: qty 40

## 2020-08-11 MED ORDER — SODIUM CHLORIDE 0.9 % IV SOLN
10.0000 mL/h | Freq: Once | INTRAVENOUS | Status: AC
  Administered 2020-08-12: 10 mL/h via INTRAVENOUS

## 2020-08-11 NOTE — ED Provider Notes (Signed)
[facility] EMERGENCY DEPARTMENT Provider Note   CSN: [id number] Arrival date & time: 08/11/20  1914     History Chief Complaint  Patient presents with  . Chest Pain    Trevor Murphy is a 86 y.o. male.  Reports that her last few days he has been having some discomfort across his chest.  Not currently having any pain.  Mild, nonradiating, not associated with exertion.  Feels fatigued but otherwise denies any acute complaints.  Has history of coronary artery disease, heart failure, atrial fibrillation on anticoagulation.  HPI     Past Medical History:  Diagnosis Date  . Arthritis   . CAD (coronary artery disease)    a. Cath 08/2009: minimal nonobstructive disease (25% LAD). b. Lexiscan nuc 6/215: EF 54%, no ischemia or infarction.  . Chronic diastolic CHF (congestive heart failure) ([place])    a. Echo 2013: EF 55-60%, mild AI, mild MR. b. Echo 5/15 with EF 60-65%, no WMA, mild LVH, mild AI.  [name] Kitchen CKD (chronic kidney disease) stage 3, GFR 30-59 ml/min (HCC) 07/26/2012   a. Felt likely due to HTN.  . CVA (cerebral infarction) 07/28/2012  . Glaucoma   . Gout   . H/O umbilical hernia repair   . Headache(784.0)    " WHEN MY BLOOD PRESSURE IS UP"  . History of acute renal failure   . History of alcohol abuse   . HTN (hypertension)   . Hypertension    a. Patent renal arteries 2005.  [name] Kitchen Noncompliance   . OA (osteoarthritis)   . Other and unspecified hyperlipidemia   . Poor circulation   . Sinus bradycardia   . SVT (supraventricular tachycardia) ([place])    a. Possible AVNRT.  Got adenosine from EMS in 2015 for SVT.  During 5/15 hospitalization had episode of short R-P tachycardia (possible AVNRT) terminated by adenosine.  b. 04/2016 - recurrent tachycardia (SVT vs. AVNRT), again terminated by Adenosine  . Visual impairment     Patient Active Problem List   Diagnosis Date Noted  . Paroxysmal atrial fibrillation ([place]) 06/07/2020  . Secondary hypercoagulable state ([place])  06/07/2020  . Ischemic cerebrovascular accident (CVA) of frontal lobe ([place]) 05/20/2019  . Pain due to onychomycosis of toenails of both feet 01/20/2019  . Prediabetes 07/22/2018  . Nail abnormality 07/22/2018  . Sinus bradycardia 05/02/2016  . Hypotension due to drugs 05/02/2016  . History of tachycardia 05/02/2016  . GERD (gastroesophageal reflux disease) 02/05/2015  . Mild coronary artery disease 04/20/2014  . Atypical chest pain 04/12/2014  . Hypertensive urgency 03/11/2014  . Chronic diastolic CHF (congestive heart failure) ([place]) 01/30/2014  . Alcohol abuse 01/19/2014  . Elevated troponin 12/19/2013  . Gout 12/19/2013  . Dyslipidemia 12/16/2013  . Hypertensive crisis 12/16/2013  . Cerebral infarction ([place]) 07/28/2012  . Acute on chronic renal failure ([place]) 07/27/2012  . Essential hypertension 07/26/2012  . CKD (chronic kidney disease) stage 3, GFR 30-59 ml/min (HCC) 07/26/2012  . Poor circulation   . Recurrent ventral hernia 02/16/2012  . Hernia (acquired) (recurrent) 02/10/2011    Past Surgical History:  Procedure Laterality Date  . APPENDECTOMY    . BUBBLE STUDY  05/24/2019   Procedure: BUBBLE STUDY;  Surgeon: Donato Heinz, MD;  Location: [facility];  Service: Endoscopy;;  . CARDIAC CATHETERIZATION  11/2003   EF 50-55%  . CARDIAC CATHETERIZATION  08/2009  . CATARACT EXTRACTION EXTRACAPSULAR Right 05/17/2013   Procedure: CATARACT EXTRACTION EXTRACAPSULAR WITH INTRAOCULAR LENS PLACEMENT (IOC) RIGHT EYE;  Surgeon:  Marylynn Pearson, MD;  Location: [facility];  Service: Ophthalmology;  Laterality: Right;  . COLONOSCOPY    . HERNIA REPAIR    . LOOP RECORDER INSERTION N/A 05/24/2019   Procedure: LOOP RECORDER INSERTION;  Surgeon: Constance Haw, MD;  Location: [facility];  Service: Cardiovascular;  Laterality: N/A;  . TEE WITHOUT CARDIOVERSION N/A 05/24/2019   Procedure: TRANSESOPHAGEAL ECHOCARDIOGRAM (TEE);  Surgeon: Donato Heinz, MD;  Location[facility]  ENDOSCOPY;  Service: Endoscopy;  Laterality: N/A;  . VENTRAL HERNIA REPAIR  03/07/2012   Procedure: LAPAROSCOPIC VENTRAL HERNIA;  Surgeon: Gwenyth Ober, MD;  Location: [facility];  Service: General;  Laterality: N/A;       Family History  Problem Relation Age of Onset  . CVA Mother   . Hypertension Brother   . Hypertension Sister   . Hypertension Brother   . Stroke Sister   . Heart attack Neg Hx     Social History   Tobacco Use  . Smoking status: Never Smoker  . Smokeless tobacco: Never Used  Vaping Use  . Vaping Use: Never used  Substance Use Topics  . Alcohol use: No    Alcohol/week: 1.0 standard drink    Types: 1 Cans of beer per week    Comment: h/o heavy use, quit in 2017  . Drug use: No    Home Medications Prior to Admission medications   Medication Sig Start Date End Date Taking? Authorizing Provider  apixaban (ELIQUIS) 2.5 MG TABS tablet Take 1 tablet (2.5 mg total) by mouth 2 (two) times daily. 07/19/20   Fenton, Clint R, PA  atorvastatin (LIPITOR) 80 MG tablet Take 1 tablet (80 mg total) by mouth daily at 6 PM. 03/12/20   [name], FNP  ferrous sulfate 325 (65 FE) MG tablet Take 1 tablet (325 mg total) by mouth daily. 03/12/20 03/12/21  [name], FNP  LUMIGAN 0.01 % SOLN  08/28/19   [provider]    Allergies    Patient has no known allergies.  Review of Systems   Review of Systems  Constitutional: Negative for chills and fever.  HENT: Negative for ear pain and sore throat.   Eyes: Negative for pain and visual disturbance.  Respiratory: Negative for cough and shortness of breath.   Cardiovascular: Positive for chest pain. Negative for palpitations.  Gastrointestinal: Negative for abdominal pain and vomiting.  Genitourinary: Negative for dysuria and hematuria.  Musculoskeletal: Negative for arthralgias and back pain.  Skin: Negative for color change and rash.  Neurological: Negative for seizures and syncope.  All other systems reviewed and are  negative.   Physical Exam Updated Vital Signs BP (!) 143/72   Pulse 69   Temp 98.1 F (36.7 C) (Oral)   Resp 16   SpO2 100%   Physical Exam Vitals and nursing note reviewed.  Constitutional:      Appearance: He is well-developed and well-nourished.  HENT:     Head: Normocephalic and atraumatic.  Eyes:     Conjunctiva/sclera: Conjunctivae normal.  Cardiovascular:     Rate and Rhythm: Normal rate and regular rhythm.     Heart sounds: No murmur heard.   Pulmonary:     Effort: Pulmonary effort is normal. No respiratory distress.     Breath sounds: Normal breath sounds.  Abdominal:     Palpations: Abdomen is soft.     Tenderness: There is no abdominal tenderness.  Musculoskeletal:        General: No edema.  Cervical back: Neck supple.     Right lower leg: No edema.     Left lower leg: No edema.  Skin:    General: Skin is warm and dry.     Capillary Refill: Capillary refill takes less than 2 seconds.  Neurological:     General: No focal deficit present.     Mental Status: He is alert and oriented to person, place, and time.  Psychiatric:        Mood and Affect: Mood and affect normal.     ED Results / Procedures / Treatments   Labs (all labs ordered are listed, but only abnormal results are displayed) Labs Reviewed  BASIC METABOLIC PANEL - Abnormal; Notable for the following components:      Result Value   Chloride 112 (*)    CO2 16 (*)    Glucose, Bld 126 (*)    BUN 25 (*)    Creatinine, Ser 2.14 (*)    Calcium 7.8 (*)    GFR, Estimated 30 (*)    All other components within normal limits  CBC - Abnormal; Notable for the following components:   RBC 3.04 (*)    Hemoglobin 6.3 (*)    HCT 22.8 (*)    MCV 75.0 (*)    MCH 20.7 (*)    MCHC 27.6 (*)    RDW 21.1 (*)    nRBC 0.7 (*)    All other components within normal limits  POC OCCULT BLOOD, ED - Abnormal; Notable for the following components:   Fecal Occult Bld POSITIVE (*)    All other components  within normal limits  TROPONIN I (HIGH SENSITIVITY) - Abnormal; Notable for the following components:   Troponin I (High Sensitivity) 21 (*)    All other components within normal limits  TROPONIN I (HIGH SENSITIVITY) - Abnormal; Notable for the following components:   Troponin I (High Sensitivity) 19 (*)    All other components within normal limits  SARS CORONAVIRUS 2 (TAT 6-24 HRS)  PREPARE RBC (CROSSMATCH)    EKG EKG Interpretation  Date/Time:  Sunday August 11 2020 19:21:24 EST Ventricular Rate:  79 PR Interval:  244 QRS Duration: 122 QT Interval:  432 QTC Calculation: 495 R Axis:   80 Text Interpretation: Sinus rhythm with 1st degree A-V block with occasional Premature ventricular complexes Right bundle branch block Abnormal ECG Confirmed by Madalyn Rob [phone]) on 08/11/2020 9:27:24 PM   Radiology No results found.  Procedures .Critical Care Performed by: Lucrezia Starch, MD Authorized by: Lucrezia Starch, MD   Critical care provider statement:    Critical care time (minutes):  45   Critical care was necessary to treat or prevent imminent or life-threatening deterioration of the following conditions:  Circulatory failure   Critical care was time spent personally by me on the following activities:  Discussions with consultants, evaluation of patient's response to treatment, examination of patient, ordering and performing treatments and interventions, ordering and review of laboratory studies, ordering and review of radiographic studies, pulse oximetry, re-evaluation of patient's condition, obtaining history from patient or surrogate and review of old charts   (including critical care time)  Medications Ordered in ED Medications  0.9 %  sodium chloride infusion (has no administration in time range)    ED Course  I have reviewed the triage vital signs and the nursing notes.  Pertinent labs & imaging results that were available during my care of the patient  were reviewed by me and considered  in my medical decision making (see chart for details).    MDM Rules/Calculators/A&P                         86 year old male presenting to the emergency room with concern for chest pain.  On exam patient is currently well-appearing with stable vital signs.  He does not have any ongoing pain.  His EKG does not have acute ischemic change, troponin initially was minimally elevated, repeat unchanged.  Doubt ACS.  More concerning his hemoglobin was down to 6.2.  His stool was somewhat dark but no frank blood and was heme positive.  He is on anticoagulation.  Will consult to the hospitalist service for admission.  Sent message to Dr. Ardis Hughs to request GI consult for tomorrow morning.  Will transfuse 1 unit of blood this evening.  Final Clinical Impression(s) / ED Diagnoses Final diagnoses:  Anemia, unspecified type  Chest pain, unspecified type  Heme positive stool    Rx / DC Orders ED Discharge Orders    None       Lucrezia Starch, MD 08/11/20 2252

## 2020-08-11 NOTE — ED Triage Notes (Signed)
Pt presents to ED POV. Pt c/o generalized CP that began a few days ago. Pt has hx had multiple cardiac caths

## 2020-08-12 ENCOUNTER — Telehealth: Payer: Self-pay | Admitting: Hematology and Oncology

## 2020-08-12 ENCOUNTER — Observation Stay (HOSPITAL_COMMUNITY): Payer: Medicare Other | Admitting: Certified Registered"

## 2020-08-12 ENCOUNTER — Encounter (HOSPITAL_COMMUNITY): Payer: Self-pay | Admitting: Internal Medicine

## 2020-08-12 ENCOUNTER — Encounter (HOSPITAL_COMMUNITY)
Admission: EM | Disposition: A | Discharge status: Home Health | Payer: Self-pay | Source: Home / Self Care | Attending: Internal Medicine

## 2020-08-12 ENCOUNTER — Other Ambulatory Visit: Payer: Self-pay | Admitting: Physician Assistant

## 2020-08-12 ENCOUNTER — Encounter: Payer: Self-pay | Admitting: Hematology and Oncology

## 2020-08-12 DIAGNOSIS — R195 Other fecal abnormalities: Secondary | ICD-10-CM | POA: Diagnosis not present

## 2020-08-12 DIAGNOSIS — N1832 Chronic kidney disease, stage 3b: Secondary | ICD-10-CM

## 2020-08-12 DIAGNOSIS — I2510 Atherosclerotic heart disease of native coronary artery without angina pectoris: Secondary | ICD-10-CM | POA: Diagnosis not present

## 2020-08-12 DIAGNOSIS — Z20822 Contact with and (suspected) exposure to covid-19: Secondary | ICD-10-CM | POA: Diagnosis not present

## 2020-08-12 DIAGNOSIS — D62 Acute posthemorrhagic anemia: Secondary | ICD-10-CM | POA: Diagnosis not present

## 2020-08-12 DIAGNOSIS — E782 Mixed hyperlipidemia: Secondary | ICD-10-CM | POA: Diagnosis present

## 2020-08-12 DIAGNOSIS — I5032 Chronic diastolic (congestive) heart failure: Secondary | ICD-10-CM

## 2020-08-12 DIAGNOSIS — Z79899 Other long term (current) drug therapy: Secondary | ICD-10-CM | POA: Diagnosis not present

## 2020-08-12 DIAGNOSIS — K267 Chronic duodenal ulcer without hemorrhage or perforation: Secondary | ICD-10-CM | POA: Diagnosis present

## 2020-08-12 DIAGNOSIS — I10 Essential (primary) hypertension: Secondary | ICD-10-CM

## 2020-08-12 DIAGNOSIS — R778 Other specified abnormalities of plasma proteins: Secondary | ICD-10-CM

## 2020-08-12 DIAGNOSIS — K2970 Gastritis, unspecified, without bleeding: Secondary | ICD-10-CM | POA: Diagnosis present

## 2020-08-12 DIAGNOSIS — R079 Chest pain, unspecified: Secondary | ICD-10-CM

## 2020-08-12 DIAGNOSIS — Z7901 Long term (current) use of anticoagulants: Secondary | ICD-10-CM | POA: Diagnosis not present

## 2020-08-12 DIAGNOSIS — H409 Unspecified glaucoma: Secondary | ICD-10-CM | POA: Diagnosis not present

## 2020-08-12 DIAGNOSIS — E785 Hyperlipidemia, unspecified: Secondary | ICD-10-CM | POA: Diagnosis not present

## 2020-08-12 DIAGNOSIS — I480 Paroxysmal atrial fibrillation: Secondary | ICD-10-CM

## 2020-08-12 DIAGNOSIS — Z8673 Personal history of transient ischemic attack (TIA), and cerebral infarction without residual deficits: Secondary | ICD-10-CM | POA: Diagnosis not present

## 2020-08-12 DIAGNOSIS — K922 Gastrointestinal hemorrhage, unspecified: Secondary | ICD-10-CM | POA: Diagnosis not present

## 2020-08-12 DIAGNOSIS — K319 Disease of stomach and duodenum, unspecified: Secondary | ICD-10-CM | POA: Diagnosis not present

## 2020-08-12 DIAGNOSIS — K921 Melena: Secondary | ICD-10-CM | POA: Diagnosis not present

## 2020-08-12 DIAGNOSIS — K2950 Unspecified chronic gastritis without bleeding: Secondary | ICD-10-CM | POA: Diagnosis not present

## 2020-08-12 DIAGNOSIS — I130 Hypertensive heart and chronic kidney disease with heart failure and stage 1 through stage 4 chronic kidney disease, or unspecified chronic kidney disease: Secondary | ICD-10-CM | POA: Diagnosis not present

## 2020-08-12 DIAGNOSIS — K2980 Duodenitis without bleeding: Secondary | ICD-10-CM | POA: Diagnosis present

## 2020-08-12 DIAGNOSIS — K269 Duodenal ulcer, unspecified as acute or chronic, without hemorrhage or perforation: Secondary | ICD-10-CM | POA: Diagnosis not present

## 2020-08-12 DIAGNOSIS — M109 Gout, unspecified: Secondary | ICD-10-CM | POA: Diagnosis not present

## 2020-08-12 DIAGNOSIS — D509 Iron deficiency anemia, unspecified: Secondary | ICD-10-CM | POA: Diagnosis not present

## 2020-08-12 HISTORY — PX: BIOPSY: SHX5522

## 2020-08-12 HISTORY — PX: ESOPHAGOGASTRODUODENOSCOPY (EGD) WITH PROPOFOL: SHX5813

## 2020-08-12 LAB — COMPREHENSIVE METABOLIC PANEL
ALT: 10 U/L (ref 0–44)
AST: 16 U/L (ref 15–41)
Albumin: 3.1 g/dL — ABNORMAL LOW (ref 3.5–5.0)
Alkaline Phosphatase: 59 U/L (ref 38–126)
Anion gap: 13 (ref 5–15)
BUN: 24 mg/dL — ABNORMAL HIGH (ref 8–23)
CO2: 15 mmol/L — ABNORMAL LOW (ref 22–32)
Calcium: 7.4 mg/dL — ABNORMAL LOW (ref 8.9–10.3)
Chloride: 111 mmol/L (ref 98–111)
Creatinine, Ser: 2.05 mg/dL — ABNORMAL HIGH (ref 0.61–1.24)
GFR, Estimated: 32 mL/min — ABNORMAL LOW (ref >60)
Glucose, Bld: 88 mg/dL (ref 70–99)
Potassium: 4.1 mmol/L (ref 3.5–5.1)
Sodium: 139 mmol/L (ref 135–145)
Total Bilirubin: 0.6 mg/dL (ref 0.3–1.2)
Total Protein: 6.3 g/dL — ABNORMAL LOW (ref 6.5–8.1)

## 2020-08-12 LAB — CBC WITH DIFFERENTIAL/PLATELET
Abs Immature Granulocytes: 0.01 10*3/uL (ref 0.00–0.07)
Basophils Absolute: 0 10*3/uL (ref 0.0–0.1)
Basophils Relative: 1 %
Eosinophils Absolute: 0 10*3/uL (ref 0.0–0.5)
Eosinophils Relative: 1 %
HCT: 24.9 % — ABNORMAL LOW (ref 39.0–52.0)
Hemoglobin: 7 g/dL — ABNORMAL LOW (ref 13.0–17.0)
Immature Granulocytes: 0 %
Lymphocytes Relative: 14 %
Lymphs Abs: 0.5 10*3/uL — ABNORMAL LOW (ref 0.7–4.0)
MCH: 22.2 pg — ABNORMAL LOW (ref 26.0–34.0)
MCHC: 28.1 g/dL — ABNORMAL LOW (ref 30.0–36.0)
MCV: 79 fL — ABNORMAL LOW (ref 80.0–100.0)
Monocytes Absolute: 0.3 10*3/uL (ref 0.1–1.0)
Monocytes Relative: 10 %
Neutro Abs: 2.7 10*3/uL (ref 1.7–7.7)
Neutrophils Relative %: 74 %
Platelets: 131 10*3/uL — ABNORMAL LOW (ref 150–400)
RBC: 3.15 MIL/uL — ABNORMAL LOW (ref 4.22–5.81)
RDW: 21.3 % — ABNORMAL HIGH (ref 11.5–15.5)
WBC: 3.6 10*3/uL — ABNORMAL LOW (ref 4.0–10.5)
nRBC: 0.8 % — ABNORMAL HIGH (ref 0.0–0.2)

## 2020-08-12 LAB — GLUCOSE, CAPILLARY
Glucose-Capillary: 52 mg/dL — ABNORMAL LOW (ref 70–99)
Glucose-Capillary: 66 mg/dL — ABNORMAL LOW (ref 70–99)

## 2020-08-12 LAB — PROTIME-INR
INR: 1.4 — ABNORMAL HIGH (ref 0.8–1.2)
Prothrombin Time: 16.3 seconds — ABNORMAL HIGH (ref 11.4–15.2)

## 2020-08-12 LAB — TROPONIN I (HIGH SENSITIVITY): Troponin I (High Sensitivity): 17 ng/L (ref <18)

## 2020-08-12 LAB — CBG MONITORING, ED
Glucose-Capillary: 42 mg/dL — CL (ref 70–99)
Glucose-Capillary: 83 mg/dL (ref 70–99)

## 2020-08-12 LAB — HEMOGLOBIN A1C
Hgb A1c MFr Bld: 5.4 % (ref 4.8–5.6)
Mean Plasma Glucose: 108.28 mg/dL

## 2020-08-12 LAB — RESP PANEL BY RT-PCR (FLU A&B, COVID) ARPGX2
Influenza A by PCR: NEGATIVE
Influenza B by PCR: NEGATIVE
SARS Coronavirus 2 by RT PCR: NEGATIVE

## 2020-08-12 LAB — ABO/RH: ABO/RH(D): O NEG

## 2020-08-12 LAB — APTT: aPTT: 42 seconds — ABNORMAL HIGH (ref 24–36)

## 2020-08-12 LAB — PREPARE RBC (CROSSMATCH)

## 2020-08-12 LAB — MAGNESIUM: Magnesium: 2.5 mg/dL — ABNORMAL HIGH (ref 1.7–2.4)

## 2020-08-12 SURGERY — ESOPHAGOGASTRODUODENOSCOPY (EGD) WITH PROPOFOL
Anesthesia: Monitor Anesthesia Care

## 2020-08-12 MED ORDER — DEXTROSE 50 % IV SOLN
INTRAVENOUS | Status: AC
  Administered 2020-08-12: 1 via INTRAVENOUS
  Filled 2020-08-12: qty 50

## 2020-08-12 MED ORDER — ATORVASTATIN CALCIUM 80 MG PO TABS
80.0000 mg | ORAL_TABLET | Freq: Every day | ORAL | Status: DC
  Administered 2020-08-12 – 2020-08-14 (×3): 80 mg via ORAL
  Filled 2020-08-12: qty 1
  Filled 2020-08-12: qty 2
  Filled 2020-08-12: qty 1

## 2020-08-12 MED ORDER — PROPOFOL 10 MG/ML IV BOLUS
INTRAVENOUS | Status: DC | PRN
  Administered 2020-08-12: 30 mg via INTRAVENOUS
  Administered 2020-08-12: 20 mg via INTRAVENOUS

## 2020-08-12 MED ORDER — DEXTROSE-NACL 5-0.9 % IV SOLN: INTRAVENOUS | Status: DC

## 2020-08-12 MED ORDER — ACETAMINOPHEN 325 MG PO TABS
650.0000 mg | ORAL_TABLET | Freq: Four times a day (QID) | ORAL | Status: DC | PRN
  Filled 2020-08-12 (×2): qty 2

## 2020-08-12 MED ORDER — DEXTROSE 10 % IV SOLN: INTRAVENOUS | Status: DC

## 2020-08-12 MED ORDER — PROPOFOL 500 MG/50ML IV EMUL
INTRAVENOUS | Status: DC | PRN
  Administered 2020-08-12: 75 ug/kg/min via INTRAVENOUS

## 2020-08-12 MED ORDER — ONDANSETRON HCL 4 MG/2ML IJ SOLN: 4.0000 mg | Freq: Four times a day (QID) | INTRAMUSCULAR | Status: DC | PRN

## 2020-08-12 MED ORDER — DEXTROSE 50 % IV SOLN: 1.0000 | Freq: Once | INTRAVENOUS | Status: AC

## 2020-08-12 MED ORDER — SODIUM CHLORIDE 0.9 % IV SOLN: INTRAVENOUS | Status: DC

## 2020-08-12 MED ORDER — NITROGLYCERIN 0.4 MG SL SUBL: 0.4000 mg | SUBLINGUAL_TABLET | SUBLINGUAL | Status: DC | PRN

## 2020-08-12 MED ORDER — PANTOPRAZOLE SODIUM 40 MG IV SOLR
40.0000 mg | Freq: Two times a day (BID) | INTRAVENOUS | Status: DC
  Administered 2020-08-12 – 2020-08-14 (×5): 40 mg via INTRAVENOUS
  Filled 2020-08-12 (×5): qty 40

## 2020-08-12 MED ORDER — LACTATED RINGERS IV SOLN: INTRAVENOUS | Status: DC | PRN

## 2020-08-12 MED ORDER — ACETAMINOPHEN 650 MG RE SUPP: 650.0000 mg | Freq: Four times a day (QID) | RECTAL | Status: DC | PRN

## 2020-08-12 MED ORDER — INSULIN ASPART 100 UNIT/ML ~~LOC~~ SOLN: 0.0000 [IU] | SUBCUTANEOUS | Status: DC

## 2020-08-12 MED ORDER — ONDANSETRON HCL 4 MG PO TABS: 4.0000 mg | ORAL_TABLET | Freq: Four times a day (QID) | ORAL | Status: DC | PRN

## 2020-08-12 SURGICAL SUPPLY — 14 items

## 2020-08-12 NOTE — Telephone Encounter (Signed)
Received a new hem referral from [facility] for IDA. Pt has been cld and scheduled to see Dr. Lorenso Courier on 1/14 at 1pm. Pt is currently in the hospital. Letter mailed.

## 2020-08-12 NOTE — Consult Note (Signed)
Reason for Consult: Iron deficiency anemia with guaiac positive stools. Referring Physician: THP.  Trevor Murphy is an 86 y.o. male.  HPI: Trevor Murphy is an 86 year old black male, with multiple medical problems listed below, who presented to the ER with chest pain last night but on evaluation was noted not to have any acute cardiac issues.  He noticed some chest discomfort when he was watching TV yesterday located in the mid sternal area with the pressure-like sensation without any radiation to the neck.  The chest pain symptoms resolved after an hour.  He had some generalized weakness after the onset of the chest pain but denies any syncope or near syncope. He has had a recent cardioembolic stroke secondary to paroxysmal atrial fibrillation in October 2021 and has a longstanding history of diastolic congestive heart failure with his last echo revealing an EF of 60 to 65%. His last cardiac catheterization was done in 2011 when he was noted to have nonobstructive coronary artery disease. He denies having any problems with acid reflux,  abdominal pain, nausea, vomiting, melena or hematochezia. He has a good appetite and his weight has been stable.He is on Plavix and he took his last dose yesterday. His last colonoscopy was done by me at my office in 2012 when a few scattered scattered sigmoid diverticula were noted.  Past Medical History:  Diagnosis Date  . Arthritis   . CAD (coronary artery disease)    a. Cath 08/2009: minimal nonobstructive disease (25% LAD). b. Lexiscan nuc 6/215: EF 54%, no ischemia or infarction.  . Chronic diastolic CHF (congestive heart failure) ([place])    a. Echo 2013: EF 55-60%, mild AI, mild MR. b. Echo 5/15 with EF 60-65%, no WMA, mild LVH, mild AI.  [name] Kitchen CKD (chronic kidney disease) stage 3, GFR 30-59 ml/min (HCC) 07/26/2012   a. Felt likely due to HTN.  . CVA (cerebral infarction) 07/28/2012  . Glaucoma   . Gout   . H/O umbilical hernia repair   . Headache(784.0)    " WHEN MY  BLOOD PRESSURE IS UP"  . History of acute renal failure   . History of alcohol abuse   . HTN (hypertension)   . Hypertension    a. Patent renal arteries 2005.  [name] Kitchen Noncompliance   . OA (osteoarthritis)   . Other and unspecified hyperlipidemia   . Poor circulation   . Sinus bradycardia   . SVT (supraventricular tachycardia) ([place])    a. Possible AVNRT.  Got adenosine from EMS in 2015 for SVT.  During 5/15 hospitalization had episode of short R-P tachycardia (possible AVNRT) terminated by adenosine.  b. 04/2016 - recurrent tachycardia (SVT vs. AVNRT), again terminated by Adenosine  . Visual impairment    Past Surgical History:  Procedure Laterality Date  . APPENDECTOMY    . BUBBLE STUDY  05/24/2019   Procedure: BUBBLE STUDY;  Surgeon: Donato Heinz, MD;  Location: [facility];  Service: Endoscopy;;  . CARDIAC CATHETERIZATION  11/2003   EF 50-55%  . CARDIAC CATHETERIZATION  08/2009  . CATARACT EXTRACTION EXTRACAPSULAR Right 05/17/2013   Procedure: CATARACT EXTRACTION EXTRACAPSULAR WITH INTRAOCULAR LENS PLACEMENT (IOC) RIGHT EYE;  Surgeon: Marylynn Pearson, MD;  Location: [facility];  Service: Ophthalmology;  Laterality: Right;  . COLONOSCOPY    . HERNIA REPAIR    . LOOP RECORDER INSERTION N/A 05/24/2019   Procedure: LOOP RECORDER INSERTION;  Surgeon: Constance Haw, MD;  Location: [facility];  Service: Cardiovascular;  Laterality: N/A;  .  TEE WITHOUT CARDIOVERSION N/A 05/24/2019   Procedure: TRANSESOPHAGEAL ECHOCARDIOGRAM (TEE);  Surgeon: Donato Heinz, MD;  Location: [facility] ENDOSCOPY;  Service: Endoscopy;  Laterality: N/A;  . VENTRAL HERNIA REPAIR  03/07/2012   Procedure: LAPAROSCOPIC VENTRAL HERNIA;  Surgeon: Gwenyth Ober, MD;  Location: [facility];  Service: General;  Laterality: N/A;   Family History  Problem Relation Age of Onset  . CVA Mother   . Hypertension Brother   . Hypertension Sister   . Hypertension Brother   . Stroke Sister   . Heart attack Neg Hx     Social History:  reports that he has never smoked. He has never used smokeless tobacco. He reports that he does not drink alcohol and does not use drugs.  Allergies: No Known Allergies  Medications: I have reviewed the patient's current medications.  Results for orders placed or performed during the hospital encounter of 08/11/20 (from the past 48 hour(s))  Basic metabolic panel     Status: Abnormal   Collection Time: 08/11/20  7:24 PM  Result Value Ref Range   Sodium 139 135 - 145 mmol/L   Potassium 4.4 3.5 - 5.1 mmol/L   Chloride 112 (H) 98 - 111 mmol/L   CO2 16 (L) 22 - 32 mmol/L   Glucose, Bld 126 (H) 70 - 99 mg/dL    Comment: Glucose reference range applies only to samples taken after fasting for at least 8 hours.   BUN 25 (H) 8 - 23 mg/dL   Creatinine, Ser 2.14 (H) 0.61 - 1.24 mg/dL   Calcium 7.8 (L) 8.9 - 10.3 mg/dL   GFR, Estimated 30 (L) >60 mL/min    Comment: (NOTE) Calculated using the CKD-EPI Creatinine Equation (2021)    Anion gap 11 5 - 15    Comment: Performed at [facility] [address] [place], [place] [address]  CBC     Status: Abnormal   Collection Time: 08/11/20  7:24 PM  Result Value Ref Range   WBC 4.4 4.0 - 10.5 K/uL   RBC 3.04 (L) 4.22 - 5.81 MIL/uL   Hemoglobin 6.3 (LL) 13.0 - 17.0 g/dL    Comment: REPEATED TO VERIFY Reticulocyte Hemoglobin testing may be clinically indicated, consider ordering this additional test [id number] THIS CRITICAL RESULT HAS VERIFIED AND BEEN CALLED TO C WIRTZ RN BY KYUNG BAEK ON 01 02 2022 AT 2034, AND HAS BEEN READ BACK.     HCT 22.8 (L) 39.0 - 52.0 %   MCV 75.0 (L) 80.0 - 100.0 fL   MCH 20.7 (L) 26.0 - 34.0 pg   MCHC 27.6 (L) 30.0 - 36.0 g/dL   RDW 21.1 (H) 11.5 - 15.5 %   Platelets 178 150 - 400 K/uL   nRBC 0.7 (H) 0.0 - 0.2 %    Comment: Performed at [facility] [address], [place], [place] [address]  Troponin I (High Sensitivity)     Status: Abnormal   Collection Time: 08/11/20  7:24 PM   Result Value Ref Range   Troponin I (High Sensitivity) 21 (H) <18 ng/L    Comment: (NOTE) Elevated high sensitivity troponin I (hsTnI) values and significant  changes across serial measurements may suggest ACS but many other  chronic and acute conditions are known to elevate hsTnI results.  Refer to the "Links" section for chest pain algorithms and additional  guidance. Performed at [facility], [place] [address], [place], [place] [address]   Troponin I (High Sensitivity)  Status: Abnormal   Collection Time: 08/11/20  9:24 PM  Result Value Ref Range   Troponin I (High Sensitivity) 19 (H) <18 ng/L    Comment: (NOTE) Elevated high sensitivity troponin I (hsTnI) values and significant  changes across serial measurements may suggest ACS but many other  chronic and acute conditions are known to elevate hsTnI results.  Refer to the "Links" section for chest pain algorithms and additional  guidance. Performed at [facility], [place] [address], [place], [place] [address]   POC occult blood, ED     Status: Abnormal   Collection Time: 08/11/20  9:51 PM  Result Value Ref Range   Fecal Occult Bld POSITIVE (A) NEGATIVE  Resp Panel by RT-PCR (Flu A&B, Covid) Nasopharyngeal Swab     Status: None   Collection Time: 08/11/20 11:00 PM   Specimen: Nasopharyngeal Swab; Nasopharyngeal(NP) swabs in vial transport medium  Result Value Ref Range   SARS Coronavirus 2 by RT PCR NEGATIVE NEGATIVE    Comment: (NOTE) SARS-CoV-2 target nucleic acids are NOT DETECTED.  The SARS-CoV-2 RNA is generally detectable in upper respiratory specimens during the acute phase of infection. The lowest concentration of SARS-CoV-2 viral copies this assay can detect is 138 copies/mL. A negative result does not preclude SARS-Cov-2 infection and should not be used as the sole basis for treatment or other patient management decisions. A negative result may occur with  improper specimen collection/handling,  submission of specimen other than nasopharyngeal swab, presence of viral mutation(s) within the areas targeted by this assay, and inadequate number of viral copies(<138 copies/mL). A negative result must be combined with clinical observations, patient history, and epidemiological information. The expected result is Negative.  Fact Sheet for Patients:  EntrepreneurPulse.com.au  Fact Sheet for Healthcare Providers:  IncredibleEmployment.be  This test is no t yet approved or cleared by the [place] FDA and  has been authorized for detection and/or diagnosis of SARS-CoV-2 by FDA under an Emergency Use Authorization (EUA). This EUA will remain  in effect (meaning this test can be used) for the duration of the COVID-19 declaration under Section 564(b)(1) of the Act, 21 U.S.C.section 360bbb-3(b)(1), unless the authorization is terminated  or revoked sooner.       Influenza A by PCR NEGATIVE NEGATIVE   Influenza B by PCR NEGATIVE NEGATIVE    Comment: (NOTE) The Xpert Xpress SARS-CoV-2/FLU/RSV plus assay is intended as an aid in the diagnosis of influenza from Nasopharyngeal swab specimens and should not be used as a sole basis for treatment. Nasal washings and aspirates are unacceptable for Xpert Xpress SARS-CoV-2/FLU/RSV testing.  Fact Sheet for Patients: EntrepreneurPulse.com.au  Fact Sheet for Healthcare Providers: IncredibleEmployment.be  This test is not yet approved or cleared by the [place] FDA and has been authorized for detection and/or diagnosis of SARS-CoV-2 by FDA under an Emergency Use Authorization (EUA). This EUA will remain in effect (meaning this test can be used) for the duration of the COVID-19 declaration under Section 564(b)(1) of the Act, 21 U.S.C. section 360bbb-3(b)(1), unless the authorization is terminated or revoked.  Performed at [facility], [place] [address]  [place], [place] [address]   Type and screen     Status: None (Preliminary result)   Collection Time: 08/11/20 11:10 PM  Result Value Ref Range   ABO/RH(D) O NEG    Antibody Screen NEG    Sample Expiration 08/14/2020,2359    Unit Number [id number]    Blood Component Type RBC, LR IRR  Unit division 00    Status of Unit ISSUED    Transfusion Status OK TO TRANSFUSE    Crossmatch Result      Compatible Performed at [facility], [place] [address] [place], [place] [address]   ABO/Rh     Status: None   Collection Time: 08/12/20 12:30 AM  Result Value Ref Range   ABO/RH(D)      O NEG Performed at [facility] [address], [place], [place] [address]   APTT     Status: Abnormal   Collection Time: 08/12/20  2:51 AM  Result Value Ref Range   aPTT 42 (H) 24 - 36 seconds    Comment:        IF BASELINE aPTT IS ELEVATED, SUGGEST PATIENT RISK ASSESSMENT BE USED TO DETERMINE APPROPRIATE ANTICOAGULANT THERAPY. Performed at [facility], [place] [address], [place], [place] [address]   Protime-INR     Status: Abnormal   Collection Time: 08/12/20  2:51 AM  Result Value Ref Range   Prothrombin Time 16.3 (H) 11.4 - 15.2 seconds   INR 1.4 (H) 0.8 - 1.2    Comment: (NOTE) INR goal varies based on device and disease states. Performed at [facility], [place] [address] [place], [place] [address]   Prepare RBC (crossmatch)     Status: None   Collection Time: 08/12/20  4:55 AM  Result Value Ref Range   Order Confirmation      ORDER PROCESSED BY BLOOD BANK Performed at [facility], [place] [address] [place], [place] [address]   CBC WITH DIFFERENTIAL     Status: Abnormal   Collection Time: 08/12/20  4:55 AM  Result Value Ref Range   WBC 3.6 (L) 4.0 - 10.5 K/uL   RBC 3.15 (L) 4.22 - 5.81 MIL/uL   Hemoglobin 7.0 (L) 13.0 - 17.0 g/dL   HCT 24.9 (L) 39.0 - 52.0 %   MCV 79.0 (L) 80.0 - 100.0 fL   MCH 22.2 (L) 26.0 - 34.0 pg   MCHC 28.1 (L) 30.0 - 36.0 g/dL   RDW  21.3 (H) 11.5 - 15.5 %   Platelets 131 (L) 150 - 400 K/uL   nRBC 0.8 (H) 0.0 - 0.2 %   Neutrophils Relative % 74 %   Neutro Abs 2.7 1.7 - 7.7 K/uL   Lymphocytes Relative 14 %   Lymphs Abs 0.5 (L) 0.7 - 4.0 K/uL   Monocytes Relative 10 %   Monocytes Absolute 0.3 0.1 - 1.0 K/uL   Eosinophils Relative 1 %   Eosinophils Absolute 0.0 0.0 - 0.5 K/uL   Basophils Relative 1 %   Basophils Absolute 0.0 0.0 - 0.1 K/uL   Immature Granulocytes 0 %   Abs Immature Granulocytes 0.01 0.00 - 0.07 K/uL   Schistocytes PRESENT    Tear Drop Cells PRESENT    Polychromasia PRESENT    Ovalocytes PRESENT     Comment: Performed at [facility], [place] [address] [place], [place] [address]  Comprehensive metabolic panel     Status: Abnormal   Collection Time: 08/12/20  4:55 AM  Result Value Ref Range   Sodium 139 135 - 145 mmol/L   Potassium 4.1 3.5 - 5.1 mmol/L   Chloride 111 98 - 111 mmol/L   CO2 15 (L) 22 - 32 mmol/L   Glucose, Bld 88 70 - 99 mg/dL    Comment: Glucose reference range applies only to samples taken after  fasting for at least 8 hours.   BUN 24 (H) 8 - 23 mg/dL   Creatinine, Ser 2.05 (H) 0.61 - 1.24 mg/dL   Calcium 7.4 (L) 8.9 - 10.3 mg/dL   Total Protein 6.3 (L) 6.5 - 8.1 g/dL   Albumin 3.1 (L) 3.5 - 5.0 g/dL   AST 16 15 - 41 U/L   ALT 10 0 - 44 U/L   Alkaline Phosphatase 59 38 - 126 U/L   Total Bilirubin 0.6 0.3 - 1.2 mg/dL   GFR, Estimated 32 (L) >60 mL/min    Comment: (NOTE) Calculated using the CKD-EPI Creatinine Equation (2021)    Anion gap 13 5 - 15    Comment: Performed at [facility], [place] [address], [place], [place] [address]  Magnesium     Status: Abnormal   Collection Time: 08/12/20  4:55 AM  Result Value Ref Range   Magnesium 2.5 (H) 1.7 - 2.4 mg/dL    Comment: Performed at [facility] [address] [place], [place] [address]  Troponin I (High Sensitivity)     Status: None   Collection Time: 08/12/20  4:55 AM  Result Value Ref Range   Troponin  I (High Sensitivity) 17 <18 ng/L    Comment: (NOTE) Elevated high sensitivity troponin I (hsTnI) values and significant  changes across serial measurements may suggest ACS but many other  chronic and acute conditions are known to elevate hsTnI results.  Refer to the "Links" section for chest pain algorithms and additional  guidance. Performed at [facility], [place] [address], [place], [place] [address]     DG Chest 2 View  Result Date: 08/11/2020 CLINICAL DATA:  Chest pain EXAM: CHEST - 2 VIEW COMPARISON:  05/09/2016 FINDINGS: The heart size and mediastinal contours are within normal limits. There is left basilar atelectasis. Otherwise, both lungs are clear. The visualized skeletal structures are unremarkable. IMPRESSION: Left basilar atelectasis. Electronically Signed   By: Ulyses Jarred M.D.   On: 08/11/2020 23:04   Review of Systems  Constitutional: Positive for fatigue. Negative for activity change, appetite change, chills, diaphoresis, fever and unexpected weight change.  Eyes: Negative.   Respiratory: Positive for chest tightness. Negative for apnea, cough, choking, shortness of breath, wheezing and stridor.   Gastrointestinal: Negative for abdominal distention, abdominal pain, anal bleeding, blood in stool, constipation, diarrhea, nausea, rectal pain and vomiting.  Endocrine: Negative.   Musculoskeletal: Positive for arthralgias and myalgias. Negative for gait problem and joint swelling.  Allergic/Immunologic: Negative.   Neurological: Positive for dizziness and weakness. Negative for tremors, seizures, syncope, facial asymmetry, speech difficulty, numbness and headaches.  Psychiatric/Behavioral: Negative for agitation, behavioral problems, confusion, decreased concentration, dysphoric mood, hallucinations, self-injury and suicidal ideas. The patient is not nervous/anxious.    Blood pressure (!) 154/95, pulse 64, temperature 98.4 F (36.9 C), temperature source Oral, resp. rate  17, SpO2 100 %. Physical Exam Constitutional:      General: He is not in acute distress. HENT:     Head: Normocephalic and atraumatic.  Eyes:     Extraocular Movements: Extraocular movements intact.  Cardiovascular:     Rate and Rhythm: Rhythm irregular.  Pulmonary:     Breath sounds: Normal breath sounds.  Abdominal:     General: There is no abdominal bruit.     Palpations: Abdomen is soft. There is no fluid wave, hepatomegaly, splenomegaly or mass.     Tenderness: There is no abdominal tenderness. There is no guarding or  rebound.  Musculoskeletal:     Cervical back: Neck supple.  Skin:    General: Skin is warm and dry.  Neurological:     Mental Status: He is alert.  Psychiatric:        Mood and Affect: Mood is not anxious.        Behavior: Behavior normal. Behavior is not agitated.   Assessment/Plan: 1) Iron deficiency anemia with guaiac positive stools-will proceed with an EGD today-rule out PUD, AVM's.   2) HTN/Hyperlipidemia/CAD/diastolic heart failure/history of SVT. 3) CKD. 4) History of alcohol abuse. 5) CVA-cardioembolic stroke secondary atrial fibrillation in October 2021. 6) Gout/osteoarthritis. 7) Glaucoma/visual impairment. 8) History of umbilical hernia repair. Juanita Craver 08/12/2020, 10:40 AM

## 2020-08-12 NOTE — Transfer of Care (Signed)
Immediate Anesthesia Transfer of Care Note  Patient: Trevor Murphy  Procedure(s) Performed: ESOPHAGOGASTRODUODENOSCOPY (EGD) WITH PROPOFOL (N/A ) BIOPSY  Patient Location: Endoscopy Unit  Anesthesia Type:MAC  Level of Consciousness: drowsy and patient cooperative  Airway & Oxygen Therapy: Patient Spontanous Breathing  Post-op Assessment: Report given to RN and Post -op Vital signs reviewed and stable  Post vital signs: Reviewed and stable  Last Vitals:  Vitals Value Taken Time  BP 104/40 08/12/20 1315  Temp 37 C 08/12/20 1315  Pulse 77 08/12/20 1316  Resp 16 08/12/20 1316  SpO2 98 % 08/12/20 1316  Vitals shown include unvalidated device data.  Last Pain:  Vitals:   08/12/20 1315  TempSrc: Temporal  PainSc: Asleep         Complications: No complications documented.

## 2020-08-12 NOTE — Op Note (Signed)
[facility] Patient Name: Trevor Murphy Procedure Date : 08/12/2020 MRN: [id number] Attending MD: Juanita Craver , MD Date of Birth: 08-23-1934 CSN: [id number] Age: 86 Admit Type: Inpatient Procedure:                EGD with antral gastritis. Indications:              Iron deficiency anemia, Heme positive stool,                            History of Goody powder use. Providers:                Juanita Craver, MD, Erenest Rasher, RN, Cherylynn Ridges,                            Technician, Viann Fish, CRNA Referring MD:             [name], FNP Medicines:                Monitored Anesthesia Care. Complications:            No immediate complications. Estimated Blood Loss:     Estimated blood loss: minimal. Procedure:                Pre-Anesthesia Assessment: - Prior to the                            procedure, a history and physical was performed,                            and patient medications and allergies were                            reviewed. The patient's tolerance of previous                            anesthesia was also reviewed. The risks and                            benefits of the The risks and benefits of the                            procedure and the sedation options and risks were                            discussed with the patient. All questions were                            answered, and informed consent was obtained. Prior                            Anticoagulants: The patient has taken Eliquis                            (apixaban), last dose was 1 day prior to procedure.  ASA Grade Assessment: IV - A patient with severe                            systemic disease that is a constant threat to life.                            After reviewing the risks and benefits, the patient                            was deemed in satisfactory condition to undergo the                            procedure.                           After  obtaining informed consent, the endoscope was                            passed under direct vision. Throughout the                            procedure, the patient's blood pressure, pulse, and                            oxygen saturations were monitored continuously. The                            [id number]-[id number] ([id number] Olympus gastroscope was                            introduced through the mouth, and advanced to the                            second part of duodenum. The EGD was accomplished                            without difficulty. The patient tolerated the                            procedure well. Scope In: Scope Out: Findings:      The examined esophagus and GEJ appeared widely patent and normal.      Multiple dispersed erosions with stigmata of recent bleeding were found       in the gastric antrum-antral biopsies were done to rule out H. pylori by       pathology.      The cardia and gastric fundus appeared normal on retroflexion.      Two non-bleeding cratered duodenal ulcers, 10 mm and 7 mm in size       respectively, with no stigmata of bleeding were found in the duodenal       bulb along with duodenitis in the bulb.      The first portion of the duodenum was normal. Impression:               - Normal appearing, widely patent esophagus.                           -  Erosive gastropathy with stigmata of recent                            bleeding-biopsied to rule out H. pylori by                            pathology.                           - Two non-bleeding duodenal ulcers, 10 mm and 7 mm                            in the duodenal bulb with duodenitis-see                            description above.                           - Normal first portion of the duodenum. Moderate Sedation:      MAC used. Recommendation:           - Full liquid diet today.                           - Continue present medications-patient needs to be                            on IV PPI's for  at least 72 hours.                           - No Ibuprofen, Naproxen, or other non-steroidal                            anti-inflammatory drugs. Procedure Code(s):        --- Professional ---                           [phone], Esophagogastroduodenoscopy, flexible,                            transoral; with biopsy, single or multiple Diagnosis Code(s):        --- Professional ---                           D50.9, Iron deficiency anemia, unspecified                           R19.5, Other fecal abnormalities                           K26.9, Duodenal ulcer, unspecified as acute or                            chronic, without hemorrhage or perforation                           K92.2, Gastrointestinal hemorrhage, unspecified CPT copyright 2019  American Medical Association. All rights reserved. The codes documented in this report are preliminary and upon coder review may  be revised to meet current compliance requirements. Juanita Craver, MD Juanita Craver, MD 08/12/2020 1:42:31 PM This report has been signed electronically. Number of Addenda: 0

## 2020-08-12 NOTE — ED Notes (Signed)
Pt transported to endo.  

## 2020-08-12 NOTE — H&P (Signed)
History and Physical    Trevor Murphy GEX:[id number] DOB: [birth year] DOA: 08/11/2020  PCP: [name], FNP  Patient coming from:    Chief Complaint:  Chief Complaint  Patient presents with  . Chest Pain     HPI:    86 year old male with past medical history of coronary artery disease (nonobstructive cath 2011), chronic kidney disease stage IIIb, gout, iron deficiency anemia, diastolic congestive heart failure (Echo 05/2019 EF 60-65%), recent cardioembolic stroke secondary to paroxysmal atrial fibrillation (05/2020) who presents to [facility] emergency department with complaints of chest pain.  Patient explains that yesterday afternoon while he was sitting in his living room watching TV he began to experience chest pain.  Patient describes chest pain as midsternal in location, pressure-like in quality and moderate in intensity.  There were no alleviating or exacerbating factors.  Chest pain seemed to spontaneously resolve after approximately 1 hour.  The following morning, patient had another similar episode of chest pain while washing his dishes in the kitchen.  Patient reports that this episode was no different in intensity or quality compared to the episode yesterday.  Chest discomfort once again resolved after approximately 1 hour.  Upon further questioning, patient states for approximately the past 1-1/2 weeks he has been experiencing generalized weakness.  Generalized weakness was initially mild but progressively worsened.  Weakness is worse with exertion and improved with rest.  Weakness has been associated with bouts of lightheadedness, primarily upon rising from a seated position.  Patient denies any associated shortness of breath.  Patient denies any obvious blood in the stool, black stool or hematemesis as of late.  Patient denies abdominal pain.  Patient denies regular alcohol use.  Patient does admit to using Goody powder daily despite being on Eliquis that has recently  been prescribed since October for paroxysmal atrial fibrillation.  After the second episode of chest discomfort the patient presented to [facility] emergency department for evaluation.  Upon evaluation in the emergency department, patient was found to have a markedly low hemoglobin of 6.3.  Emergency department provider ordered 1 unit of packed red blood cells to be transfused.  Emergency department provider additionally sent secure chat to Dr. Ardis Hughs with gastroenterology to request nonurgent GI consultation in the morning.  Patient was initiated on intravenous Protonix.  The hospitalist group was then called to assess the patient for admission to the hospital.  Review of Systems:   Review of Systems  Constitutional: Positive for malaise/fatigue.  Neurological: Positive for dizziness and weakness.  All other systems reviewed and are negative.   Past Medical History:  Diagnosis Date  . Arthritis   . CAD (coronary artery disease)    a. Cath 08/2009: minimal nonobstructive disease (25% LAD). b. Lexiscan nuc 6/215: EF 54%, no ischemia or infarction.  . Chronic diastolic CHF (congestive heart failure) ([place])    a. Echo 2013: EF 55-60%, mild AI, mild MR. b. Echo 5/15 with EF 60-65%, no WMA, mild LVH, mild AI.  [name] Kitchen CKD (chronic kidney disease) stage 3, GFR 30-59 ml/min (HCC) 07/26/2012   a. Felt likely due to HTN.  . CVA (cerebral infarction) 07/28/2012  . Glaucoma   . Gout   . H/O umbilical hernia repair   . Headache(784.0)    " WHEN MY BLOOD PRESSURE IS UP"  . History of acute renal failure   . History of alcohol abuse   . HTN (hypertension)   . Hypertension    a. Patent renal arteries 2005.  [name] Kitchen  Noncompliance   . OA (osteoarthritis)   . Other and unspecified hyperlipidemia   . Poor circulation   . Sinus bradycardia   . SVT (supraventricular tachycardia) ([place])    a. Possible AVNRT.  Got adenosine from EMS in 2015 for SVT.  During 5/15 hospitalization had episode of short R-P  tachycardia (possible AVNRT) terminated by adenosine.  b. 04/2016 - recurrent tachycardia (SVT vs. AVNRT), again terminated by Adenosine  . Visual impairment     Past Surgical History:  Procedure Laterality Date  . APPENDECTOMY    . BUBBLE STUDY  05/24/2019   Procedure: BUBBLE STUDY;  Surgeon: Donato Heinz, MD;  Location: [place];  Service: Endoscopy;;  . CARDIAC CATHETERIZATION  11/2003   EF 50-55%  . CARDIAC CATHETERIZATION  08/2009  . CATARACT EXTRACTION EXTRACAPSULAR Right 05/17/2013   Procedure: CATARACT EXTRACTION EXTRACAPSULAR WITH INTRAOCULAR LENS PLACEMENT (IOC) RIGHT EYE;  Surgeon: Marylynn Pearson, MD;  Location: [place];  Service: Ophthalmology;  Laterality: Right;  . COLONOSCOPY    . HERNIA REPAIR    . LOOP RECORDER INSERTION N/A 05/24/2019   Procedure: LOOP RECORDER INSERTION;  Surgeon: Constance Haw, MD;  Location: [place] [facility];  Service: Cardiovascular;  Laterality: N/A;  . TEE WITHOUT CARDIOVERSION N/A 05/24/2019   Procedure: TRANSESOPHAGEAL ECHOCARDIOGRAM (TEE);  Surgeon: Donato Heinz, MD;  Location: [facility] ENDOSCOPY;  Service: Endoscopy;  Laterality: N/A;  . VENTRAL HERNIA REPAIR  03/07/2012   Procedure: LAPAROSCOPIC VENTRAL HERNIA;  Surgeon: Gwenyth Ober, MD;  Location: [place];  Service: General;  Laterality: N/A;     reports that he has never smoked. He has never used smokeless tobacco. He reports that he does not drink alcohol and does not use drugs.  No Known Allergies  Family History  Problem Relation Age of Onset  . CVA Mother   . Hypertension Brother   . Hypertension Sister   . Hypertension Brother   . Stroke Sister   . Heart attack Neg Hx      Prior to Admission medications   Medication Sig Start Date End Date Taking? Authorizing Provider  apixaban (ELIQUIS) 2.5 MG TABS tablet Take 1 tablet (2.5 mg total) by mouth 2 (two) times daily. 07/19/20   Fenton, Clint R, PA  atorvastatin (LIPITOR) 80 MG tablet Take 1 tablet (80 mg  total) by mouth daily at 6 PM. 03/12/20   [name], FNP  ferrous sulfate 325 (65 FE) MG tablet Take 1 tablet (325 mg total) by mouth daily. 03/12/20 03/12/21  [name], FNP  LUMIGAN 0.01 % SOLN  08/28/19   [provider]    Physical Exam: Vitals:   08/12/20 0045 08/12/20 0100 08/12/20 0115 08/12/20 0207  BP: (!) 147/78 130/73 (!) 146/72 140/68  Pulse: 74 82 80 70  Resp: 18 16 (!) 28 18  Temp:    98.3 F (36.8 C)  TempSrc:    Oral  SpO2: 100% 100% 100% 100%    Constitutional: Acute alert and oriented x3, no associated distress.   Skin: no rashes, no lesions, good skin turgor noted. Eyes: Pupils are equally reactive to light.  No evidence of scleral icterus or conjunctival pallor.  ENMT: Moist mucous membranes noted.  Posterior pharynx clear of any exudate or lesions.   Neck: normal, supple, no masses, no thyromegaly.  No evidence of jugular venous distension.   Respiratory: clear to auscultation bilaterally, no wheezing, no crackles. Normal respiratory effort. No accessory muscle use.  Cardiovascular: Regular rate and rhythm,  no murmurs / rubs / gallops. No extremity edema. 2+ pedal pulses. No carotid bruits.  Chest:   Nontender without crepitus or deformity.   Back:   Nontender without crepitus or deformity. Abdomen: Abdomen is soft and nontender.  No evidence of intra-abdominal masses.  Positive bowel sounds noted in all quadrants.   Musculoskeletal: No joint deformity upper and lower extremities. Good ROM, no contractures. Normal muscle tone.  Neurologic: CN 2-12 grossly intact. Sensation intact.  Patient moving all 4 extremities spontaneously.  Patient is following all commands.  Patient is responsive to verbal stimuli.   Psychiatric: Patient exhibits normal mood with appropriate affect.  Patient seems to possess insight as to their current situation.     Labs on Admission: I have personally reviewed following labs and imaging studies -   CBC: Recent Labs  Lab  08/11/20 1924  WBC 4.4  HGB 6.3*  HCT 22.8*  MCV 75.0*  PLT 277   Basic Metabolic Panel: Recent Labs  Lab 08/08/20 1430 08/11/20 1924  NA 141 139  K 5.4* 4.4  CL 108* 112*  CO2 19* 16*  GLUCOSE 100* 126*  BUN 21 25*  CREATININE 1.79* 2.14*  CALCIUM 7.9* 7.8*   GFR: Estimated Creatinine Clearance: 21.7 mL/min (A) (by C-G formula based on SCr of 2.14 mg/dL (H)). Liver Function Tests: No results for input(s): AST, ALT, ALKPHOS, BILITOT, PROT, ALBUMIN in the last 168 hours. No results for input(s): LIPASE, AMYLASE in the last 168 hours. No results for input(s): AMMONIA in the last 168 hours. Coagulation Profile: No results for input(s): INR, PROTIME in the last 168 hours. Cardiac Enzymes: No results for input(s): CKTOTAL, CKMB, CKMBINDEX, TROPONINI in the last 168 hours. BNP (last 3 results) No results for input(s): PROBNP in the last 8760 hours. HbA1C: No results for input(s): HGBA1C in the last 72 hours. CBG: No results for input(s): GLUCAP in the last 168 hours. Lipid Profile: No results for input(s): CHOL, HDL, LDLCALC, TRIG, CHOLHDL, LDLDIRECT in the last 72 hours. Thyroid Function Tests: No results for input(s): TSH, T4TOTAL, FREET4, T3FREE, THYROIDAB in the last 72 hours. Anemia Panel: No results for input(s): VITAMINB12, FOLATE, FERRITIN, TIBC, IRON, RETICCTPCT in the last 72 hours. Urine analysis:    Component Value Date/Time   COLORURINE STRAW (A) 05/20/2019 1040   APPEARANCEUR CLEAR 05/20/2019 1040   LABSPEC 1.004 (L) 05/20/2019 1040   PHURINE 6.0 05/20/2019 1040   GLUCOSEU NEGATIVE 05/20/2019 1040   HGBUR LARGE (A) 05/20/2019 1040   BILIRUBINUR NEGATIVE 05/20/2019 1040   KETONESUR NEGATIVE 05/20/2019 1040   PROTEINUR NEGATIVE 05/20/2019 1040   UROBILINOGEN 0.2 07/08/2014 0333   NITRITE NEGATIVE 05/20/2019 1040   LEUKOCYTESUR NEGATIVE 05/20/2019 1040    Radiological Exams on Admission - Personally Reviewed: DG Chest 2 View  Result Date:  08/11/2020 CLINICAL DATA:  Chest pain EXAM: CHEST - 2 VIEW COMPARISON:  05/09/2016 FINDINGS: The heart size and mediastinal contours are within normal limits. There is left basilar atelectasis. Otherwise, both lungs are clear. The visualized skeletal structures are unremarkable. IMPRESSION: Left basilar atelectasis. Electronically Signed   By: Ulyses Jarred M.D.   On: 08/11/2020 23:04    EKG: Personally reviewed.  Rhythm is normal sinus rhythm with heart rate of 79 bpm.  Evidence of right bundle branch block.  PVCs noted.  No dynamic ST segment changes appreciated.  Assessment/Plan Principal Problem:   Acute blood loss anemia   Patient presenting with 1-1/2 weeks of increasing dyspnea on exertion, lightheadedness upon rising  from a seated position and notable substantial drop in hemoglobin to 6.3 on arrival.  While patient denies blood in stool or melena, patient does report ongoing daily Goody powder use despite having been started on Eliquis for paroxysmal atrial fibrillation in October.  Abdominal exam is completely benign however I suspect the patient may be suffering from an upper gastrointestinal bleeding due to ongoing use of Goody powder and Eliquis.  Patient has been made n.p.o.  Proceeding with 1 unit packed red blood cell transfusion  Will obtain posttransfusion CBC and continue to transfuse for hemoglobin less than 7  Initiating Protonix 40 mg IV every 12  Emergency department provider has already sent an epic secure chat to Dr. Ardis Hughs to request nonurgent GI consultation in the morning for potential endoscopic work-up  Holding Eliquis in the meantime.  Active Problems:   Chronic kidney disease, stage 3b (HCC)   Slight increase in creatinine and BUN compared to baseline, possibly secondary to ongoing slow GI bleeding  Strict input and output monitoring  Avoiding nephrotoxic agents if at all possible  Monitoring renal function and electrolytes with serial  chemistries    Elevated troponin level not due myocardial infarction   Patient exhibiting very slightly elevated troponin on arrival, with serial troponin revealing essentially flat trajectory of elevation plaque rupture extremely unlikely  Slight elevation in troponin likely secondary to transient supply demand mismatch due to anemia  Continuing to cycle cardiac enzymes  Monitoring patient on telemetry  Patient is currently chest pain-free    Chronic diastolic CHF (congestive heart failure) (HCC)   No evidence of cardiogenic volume overload at this time    Coronary artery disease involving native coronary artery of native heart without angina pectoris   Documented history of coronary artery disease with last cardiac catheterization in 2011 revealing nonobstructive disease.  Continue home regimen of statin therapy  Monitoring patient on telemetry  Holding anticoagulation for now in the setting of bleeding  As needed nitroglycerin for episodic chest pain    Paroxysmal atrial fibrillation (HCC)  Patient currently in controlled sinus rhythm  Holding Eliquis in the setting of concerns for bleeding  Monitoring patient on telemetry    Mixed hyperlipidemia .  Continuing home regimen of lipid lowering therapy.    Code Status:  Full code -patient has been DNR in the past however during this hospitalization I patient and his son confirmed that the patient wishes to be full code.  Full code order placed. Family Communication: Son is at bedside who has been updated on plan of care.  Status is: Observation  The patient remains OBS appropriate and will d/c before 2 midnights.  Dispo: The patient is from: Home              Anticipated d/c is to: Home              Anticipated d/c date is: 2 days              Patient currently is not medically stable to d/c.        Vernelle Emerald MD [facility] Pager [phone]  If 7PM-7AM, please contact  night-coverage [email or web address] Use universal [place] password for that web site. If you do not have the password, please call the hospital operator.  08/12/2020, 2:16 AM

## 2020-08-12 NOTE — Anesthesia Preprocedure Evaluation (Signed)
Anesthesia Evaluation  Patient identified by MRN, date of birth, ID band Patient awake    Reviewed: Allergy & Precautions, NPO status , Patient's Chart, lab work & pertinent test results  History of Anesthesia Complications Negative for: history of anesthetic complications  Airway Mallampati: II  TM Distance: >3 FB Neck ROM: Full    Dental  (+) Edentulous Upper, Edentulous Lower   Pulmonary neg pulmonary ROS,    Pulmonary exam normal        Cardiovascular hypertension, + CAD  Normal cardiovascular exam+ dysrhythmias Atrial Fibrillation and Supra Ventricular Tachycardia      Neuro/Psych CVA negative psych ROS   GI/Hepatic GERD  ,(+)     substance abuse  alcohol use, GI bleed   Endo/Other  negative endocrine ROS  Renal/GU Renal Insufficiency, ARF and CRFRenal disease  negative genitourinary   Musculoskeletal  (+) Arthritis ,   Abdominal   Peds  Hematology  (+) anemia ,   Anesthesia Other Findings   Reproductive/Obstetrics                             Anesthesia Physical Anesthesia Plan  ASA: III and emergent  Anesthesia Plan: MAC   Post-op Pain Management:    Induction: Intravenous  PONV Risk Score and Plan: 1 and Propofol infusion, TIVA and Treatment may vary due to age or medical condition  Airway Management Planned: Natural Airway, Nasal Cannula and Simple Face Mask  Additional Equipment: None  Intra-op Plan:   Post-operative Plan:   Informed Consent: I have reviewed the patients History and Physical, chart, labs and discussed the procedure including the risks, benefits and alternatives for the proposed anesthesia with the patient or authorized representative who has indicated his/her understanding and acceptance.       Plan Discussed with:   Anesthesia Plan Comments:         Anesthesia Quick Evaluation

## 2020-08-12 NOTE — Progress Notes (Signed)
PROGRESS NOTE    Trevor Murphy  FXT:[id number] DOB: 1935/06/29 DOA: 08/11/2020 PCP: [name], FNP    Brief Narrative:  86 year old male with past medical history of coronary artery disease (nonobstructive cath 2011), chronic kidney disease stage IIIb, gout, iron deficiency anemia, diastolic congestive heart failure (Echo 05/2019 EF 60-65%), recent cardioembolic stroke secondary to paroxysmal atrial fibrillation (05/2020) who presents to [facility] emergency department with complaints of chest pain with generalized weakness. In the ED, pt was found to have hgb of 6.3 with heme pos stools  Assessment & Plan:   Principal Problem:   Acute blood loss anemia Active Problems:   Essential hypertension   Chronic kidney disease, stage 3b (HCC)   Elevated troponin level not due myocardial infarction   Chronic diastolic CHF (congestive heart failure) (HCC)   Coronary artery disease involving native coronary artery of native heart without angina pectoris   Paroxysmal atrial fibrillation (HCC)   Mixed hyperlipidemia    Acute blood loss anemia secondary to erosive gastropathy   Patient presenting with 1-1/2 weeks of increasing dyspnea on exertion, lightheadedness upon rising from a seated position and notable substantial drop in hemoglobin to 6.3 on arrival.  Hx of eliquis use for afib with goody powder use as well  Stools heme pos on presentation  Given 1 unit PRBC on presentation  Cont on Protonix 40 mg IV every 12  Holding Eliquis   GI was consulted and pt is now s/p EGD 1/3 with findings of erosive gastropathy with stigmata of recent bleeding. Also two non-bleeding duodenal ulers noted  GI recommends full liquids for now and PPI IV x at least 72hrs. No Ibuprofen, naproxen, or other NSAID's  Active Problems:   Chronic kidney disease, stage 3b (HCC)   Slight increase in creatinine and BUN compared to baseline, suspect related to above blood loss anemia  Strict input  and output monitoring  Avoiding nephrotoxic agents if at all possible  Recheck bmet in AM     Elevated troponin level not due myocardial infarction   Patient presented with very slightly elevated troponin on arrival, with serial troponin trending down  Slight elevation in troponin likely secondary to transient supply demand mismatch due to anemia  Monitoring patient on telemetry  Patient is currently chest pain-free    Chronic diastolic CHF (congestive heart failure) (HCC)   No evidence of cardiogenic volume overload at this time    Coronary artery disease involving native coronary artery of native heart without angina pectoris   Documented history of coronary artery disease with last cardiac catheterization in 2011 revealing nonobstructive disease.  Continue home regimen of statin therapy  Monitoring patient on telemetry  Anticoagulation currently on hold for now in the setting of above blood loss anemia  As needed nitroglycerin for episodic chest pain    Paroxysmal atrial fibrillation (HCC)  Patient currently in controlled sinus rhythm  Holding Eliquis in the setting of concerns for bleeding  Monitoring patient on telemetry    Mixed hyperlipidemia   Continuing home regimen of lipid lowering therapy.  DVT prophylaxis: SCD's Code Status: Full Family Communication: Pt in room, family not at bedside  Status is: Observation  The patient will require care spanning > 2 midnights and should be moved to inpatient because: IV treatments appropriate due to intensity of illness or inability to take PO  Dispo: The patient is from: Home              Anticipated d/c is to: Home  Anticipated d/c date is: 3 days              Patient currently is not medically stable to d/c.    Consultants:   GI  Procedures:   Endoscopy 1/3  Antimicrobials: Anti-infectives (From admission, onward)   None       Subjective: Without  complaints  Objective: Vitals:   08/12/20 1330 08/12/20 1340 08/12/20 1350 08/12/20 1400  BP: (!) 98/45 (!) 108/40 (!) 119/57 (!) 128/56  Pulse: 65 63 (!) 57 (!) 56  Resp: 13 14 16 12   Temp:      TempSrc:      SpO2: 99% 100% 100% 100%    Intake/Output Summary (Last 24 hours) at 08/12/2020 1557 Last data filed at 08/12/2020 1146 Gross per 24 hour  Intake 83.75 ml  Output -  Net 83.75 ml   There were no vitals filed for this visit.  Examination:  General exam: Appears calm and comfortable  Respiratory system: Clear to auscultation. Respiratory effort normal. Cardiovascular system: S1 & S2 heard, Regular Gastrointestinal system: Abdomen is nondistended, soft and nontender. No organomegaly or masses felt. Normal bowel sounds heard. Central nervous system: Alert and oriented. No focal neurological deficits. Extremities: Symmetric 5 x 5 power. Skin: No rashes, lesions Psychiatry: Judgement and insight appear normal. Mood & affect appropriate.   Data Reviewed: I have personally reviewed following labs and imaging studies  CBC: Recent Labs  Lab 08/11/20 1924 08/12/20 0455  WBC 4.4 3.6*  NEUTROABS  --  2.7  HGB 6.3* 7.0*  HCT 22.8* 24.9*  MCV 75.0* 79.0*  PLT 178 527*   Basic Metabolic Panel: Recent Labs  Lab 08/08/20 1430 08/11/20 1924 08/12/20 0455  NA 141 139 139  K 5.4* 4.4 4.1  CL 108* 112* 111  CO2 19* 16* 15*  GLUCOSE 100* 126* 88  BUN 21 25* 24*  CREATININE 1.79* 2.14* 2.05*  CALCIUM 7.9* 7.8* 7.4*  MG  --   --  2.5*   GFR: Estimated Creatinine Clearance: 22.7 mL/min (A) (by C-G formula based on SCr of 2.05 mg/dL (H)). Liver Function Tests: Recent Labs  Lab 08/12/20 0455  AST 16  ALT 10  ALKPHOS 59  BILITOT 0.6  PROT 6.3*  ALBUMIN 3.1*   No results for input(s): LIPASE, AMYLASE in the last 168 hours. No results for input(s): AMMONIA in the last 168 hours. Coagulation Profile: Recent Labs  Lab 08/12/20 0251  INR 1.4*   Cardiac Enzymes: No  results for input(s): CKTOTAL, CKMB, CKMBINDEX, TROPONINI in the last 168 hours. BNP (last 3 results) No results for input(s): PROBNP in the last 8760 hours. HbA1C: No results for input(s): HGBA1C in the last 72 hours. CBG: Recent Labs  Lab 08/12/20 1102 08/12/20 1130  GLUCAP 42* 83   Lipid Profile: No results for input(s): CHOL, HDL, LDLCALC, TRIG, CHOLHDL, LDLDIRECT in the last 72 hours. Thyroid Function Tests: No results for input(s): TSH, T4TOTAL, FREET4, T3FREE, THYROIDAB in the last 72 hours. Anemia Panel: No results for input(s): VITAMINB12, FOLATE, FERRITIN, TIBC, IRON, RETICCTPCT in the last 72 hours. Sepsis Labs: No results for input(s): PROCALCITON, LATICACIDVEN in the last 168 hours.  Recent Results (from the past 240 hour(s))  Resp Panel by RT-PCR (Flu A&B, Covid) Nasopharyngeal Swab     Status: None   Collection Time: 08/11/20 11:00 PM   Specimen: Nasopharyngeal Swab; Nasopharyngeal(NP) swabs in vial transport medium  Result Value Ref Range Status   SARS Coronavirus 2 by RT PCR NEGATIVE  NEGATIVE Final    Comment: (NOTE) SARS-CoV-2 target nucleic acids are NOT DETECTED.  The SARS-CoV-2 RNA is generally detectable in upper respiratory specimens during the acute phase of infection. The lowest concentration of SARS-CoV-2 viral copies this assay can detect is 138 copies/mL. A negative result does not preclude SARS-Cov-2 infection and should not be used as the sole basis for treatment or other patient management decisions. A negative result may occur with  improper specimen collection/handling, submission of specimen other than nasopharyngeal swab, presence of viral mutation(s) within the areas targeted by this assay, and inadequate number of viral copies(<138 copies/mL). A negative result must be combined with clinical observations, patient history, and epidemiological information. The expected result is Negative.  Fact Sheet for Patients:   EntrepreneurPulse.com.au  Fact Sheet for Healthcare Providers:  IncredibleEmployment.be  This test is no t yet approved or cleared by the [place] FDA and  has been authorized for detection and/or diagnosis of SARS-CoV-2 by FDA under an Emergency Use Authorization (EUA). This EUA will remain  in effect (meaning this test can be used) for the duration of the COVID-19 declaration under Section 564(b)(1) of the Act, 21 U.S.C.section 360bbb-3(b)(1), unless the authorization is terminated  or revoked sooner.       Influenza A by PCR NEGATIVE NEGATIVE Final   Influenza B by PCR NEGATIVE NEGATIVE Final    Comment: (NOTE) The Xpert Xpress SARS-CoV-2/FLU/RSV plus assay is intended as an aid in the diagnosis of influenza from Nasopharyngeal swab specimens and should not be used as a sole basis for treatment. Nasal washings and aspirates are unacceptable for Xpert Xpress SARS-CoV-2/FLU/RSV testing.  Fact Sheet for Patients: EntrepreneurPulse.com.au  Fact Sheet for Healthcare Providers: IncredibleEmployment.be  This test is not yet approved or cleared by the [place] FDA and has been authorized for detection and/or diagnosis of SARS-CoV-2 by FDA under an Emergency Use Authorization (EUA). This EUA will remain in effect (meaning this test can be used) for the duration of the COVID-19 declaration under Section 564(b)(1) of the Act, 21 U.S.C. section 360bbb-3(b)(1), unless the authorization is terminated or revoked.  Performed at [facility], [place] [address], [place], [place] [address]      Radiology Studies: DG Chest 2 View  Result Date: 08/11/2020 CLINICAL DATA:  Chest pain EXAM: CHEST - 2 VIEW COMPARISON:  05/09/2016 FINDINGS: The heart size and mediastinal contours are within normal limits. There is left basilar atelectasis. Otherwise, both lungs are clear. The visualized skeletal structures are  unremarkable. IMPRESSION: Left basilar atelectasis. Electronically Signed   By: Ulyses Jarred M.D.   On: 08/11/2020 23:04    Scheduled Meds: . atorvastatin  80 mg Oral q1800  . pantoprazole (PROTONIX) IV  40 mg Intravenous Q12H   Continuous Infusions: . dextrose 75 mL/hr at 08/12/20 1253     LOS: 0 days   Marylu Lund, MD [facility] Pager On Amion  If 7PM-7AM, please contact night-coverage 08/12/2020, 3:57 PM

## 2020-08-12 NOTE — ED Notes (Signed)
Pt's CBG result was 42. Informed April - RN.

## 2020-08-13 DIAGNOSIS — R079 Chest pain, unspecified: Secondary | ICD-10-CM | POA: Diagnosis not present

## 2020-08-13 DIAGNOSIS — N1832 Chronic kidney disease, stage 3b: Secondary | ICD-10-CM | POA: Diagnosis not present

## 2020-08-13 DIAGNOSIS — D62 Acute posthemorrhagic anemia: Secondary | ICD-10-CM | POA: Diagnosis not present

## 2020-08-13 LAB — BPAM RBC
Blood Product Expiration Date: 202201072359
ISSUE DATE / TIME: 202201030201
Unit Type and Rh: 9500

## 2020-08-13 LAB — CBC
HCT: 26.4 % — ABNORMAL LOW (ref 39.0–52.0)
Hemoglobin: 7.5 g/dL — ABNORMAL LOW (ref 13.0–17.0)
MCH: 22 pg — ABNORMAL LOW (ref 26.0–34.0)
MCHC: 28.4 g/dL — ABNORMAL LOW (ref 30.0–36.0)
MCV: 77.4 fL — ABNORMAL LOW (ref 80.0–100.0)
Platelets: 129 10*3/uL — ABNORMAL LOW (ref 150–400)
RBC: 3.41 MIL/uL — ABNORMAL LOW (ref 4.22–5.81)
RDW: 21.4 % — ABNORMAL HIGH (ref 11.5–15.5)
WBC: 3.7 10*3/uL — ABNORMAL LOW (ref 4.0–10.5)
nRBC: 0.8 % — ABNORMAL HIGH (ref 0.0–0.2)

## 2020-08-13 LAB — COMPREHENSIVE METABOLIC PANEL
ALT: 9 U/L (ref 0–44)
AST: 14 U/L — ABNORMAL LOW (ref 15–41)
Albumin: 3.1 g/dL — ABNORMAL LOW (ref 3.5–5.0)
Alkaline Phosphatase: 63 U/L (ref 38–126)
Anion gap: 7 (ref 5–15)
BUN: 21 mg/dL (ref 8–23)
CO2: 19 mmol/L — ABNORMAL LOW (ref 22–32)
Calcium: 7.8 mg/dL — ABNORMAL LOW (ref 8.9–10.3)
Chloride: 113 mmol/L — ABNORMAL HIGH (ref 98–111)
Creatinine, Ser: 2.04 mg/dL — ABNORMAL HIGH (ref 0.61–1.24)
GFR, Estimated: 32 mL/min — ABNORMAL LOW (ref >60)
Glucose, Bld: 96 mg/dL (ref 70–99)
Potassium: 4.9 mmol/L (ref 3.5–5.1)
Sodium: 139 mmol/L (ref 135–145)
Total Bilirubin: 0.6 mg/dL (ref 0.3–1.2)
Total Protein: 6.3 g/dL — ABNORMAL LOW (ref 6.5–8.1)

## 2020-08-13 LAB — TYPE AND SCREEN
ABO/RH(D): O NEG
Antibody Screen: NEGATIVE
Unit division: 0

## 2020-08-13 LAB — GLUCOSE, CAPILLARY
Glucose-Capillary: 100 mg/dL — ABNORMAL HIGH (ref 70–99)
Glucose-Capillary: 103 mg/dL — ABNORMAL HIGH (ref 70–99)
Glucose-Capillary: 114 mg/dL — ABNORMAL HIGH (ref 70–99)
Glucose-Capillary: 82 mg/dL (ref 70–99)
Glucose-Capillary: 88 mg/dL (ref 70–99)
Glucose-Capillary: 89 mg/dL (ref 70–99)
Glucose-Capillary: 89 mg/dL (ref 70–99)

## 2020-08-13 NOTE — Anesthesia Postprocedure Evaluation (Signed)
Anesthesia Post Note  Patient: Trevor Murphy  Procedure(s) Performed: ESOPHAGOGASTRODUODENOSCOPY (EGD) WITH PROPOFOL (N/A ) BIOPSY     Patient location during evaluation: Endoscopy Anesthesia Type: MAC Level of consciousness: awake and alert Pain management: pain level controlled Vital Signs Assessment: post-procedure vital signs reviewed and stable Respiratory status: spontaneous breathing, nonlabored ventilation and respiratory function stable Cardiovascular status: blood pressure returned to baseline and stable Postop Assessment: no apparent nausea or vomiting Anesthetic complications: no   No complications documented.  Last Vitals:  Vitals:   08/13/20 1241 08/13/20 1300  BP: 134/61   Pulse: (!) 50   Resp: 15   Temp: 36.6 C   SpO2:  99%    Last Pain:  Vitals:   08/13/20 1241  TempSrc: Oral  PainSc:                  Lidia Collum

## 2020-08-13 NOTE — Progress Notes (Signed)
PROGRESS NOTE    Trevor Murphy  TDH:[id number] DOB: January 26, 1935 DOA: 08/11/2020 PCP: [name], FNP    Brief Narrative:  86 year old male with past medical history of coronary artery disease (nonobstructive cath 2011), chronic kidney disease stage IIIb, gout, iron deficiency anemia, diastolic congestive heart failure (Echo 05/2019 EF 60-65%), recent cardioembolic stroke secondary to paroxysmal atrial fibrillation (05/2020) who presents to [facility] emergency department with complaints of chest pain with generalized weakness. In the ED, pt was found to have hgb of 6.3 with heme pos stools  Assessment & Plan:   Principal Problem:   Acute blood loss anemia Active Problems:   Essential hypertension   Chronic kidney disease, stage 3b (HCC)   Elevated troponin level not due myocardial infarction   Chronic diastolic CHF (congestive heart failure) (HCC)   Coronary artery disease involving native coronary artery of native heart without angina pectoris   Paroxysmal atrial fibrillation (HCC)   Mixed hyperlipidemia    Acute blood loss anemia secondary to erosive gastropathy   Patient presenting with 1-1/2 weeks of increasing dyspnea on exertion, lightheadedness upon rising from a seated position and notable substantial drop in hemoglobin to 6.3 on arrival.  Hx of eliquis use for afib with goody powder use as well  Stools heme pos on presentation  Given 1 unit PRBC on presentation  Cont on Protonix 40 mg IV every 12  Cont to hold Eliquis until clear to resume per GI  GI was consulted and pt is now s/p EGD 1/3 with findings of erosive gastropathy with stigmata of recent bleeding. Also two non-bleeding duodenal ulers noted  GI recommends full liquids for now and PPI IV x at least 72hrs. No Ibuprofen, naproxen, or other NSAID's  Hgb remains stable this AM  Active Problems:   Chronic kidney disease, stage 3b (HCC)   Slight increase in creatinine and BUN compared to  baseline, suspect related to above blood loss anemia  Strict input and output monitoring  Avoiding nephrotoxic agents if at all possible  Renal function stable, repeat bmet in AM    Elevated troponin level not due myocardial infarction   Patient presented with very slightly elevated troponin on arrival, with serial troponin trending down  Slight elevation in troponin likely secondary to transient supply demand mismatch due to anemia  Monitoring patient on telemetry  Patient presently chest pain free    Chronic diastolic CHF (congestive heart failure) (HCC)   No evidence of cardiogenic volume overload at this time    Coronary artery disease involving native coronary artery of native heart without angina pectoris   Documented history of coronary artery disease with last cardiac catheterization in 2011 revealing nonobstructive disease.  Continue home regimen of statin therapy  Monitoring patient on telemetry  Anticoagulation currently on hold for now in the setting of above blood loss anemia  As needed nitroglycerin for episodic chest pain    Paroxysmal atrial fibrillation (HCC)  Patient currently in controlled sinus rhythm  Holding Eliquis in the setting of concerns for bleeding  Monitoring patient on telemetry    Mixed hyperlipidemia   Continuing home regimen of lipid lowering therapy.  DVT prophylaxis: SCD's Code Status: Full Family Communication: Pt in room, family not at bedside  Status is: Observation  The patient will require care spanning > 2 midnights and should be moved to inpatient because: IV treatments appropriate due to intensity of illness or inability to take PO  Dispo: The patient is from: Home  Anticipated d/c is to: Home              Anticipated d/c date is: 3 days              Patient currently is not medically stable to d/c.    Consultants:   GI  Procedures:   Endoscopy 1/3  Antimicrobials: Anti-infectives  (From admission, onward)   None      Subjective: No complaints this AM  Objective: Vitals:   08/13/20 0007 08/13/20 0355 08/13/20 1241 08/13/20 1300  BP: (!) 118/57 129/73 134/61   Pulse: 63 64 (!) 50   Resp: 14 13 15    Temp: 97.7 F (36.5 C) 97.6 F (36.4 C) 97.8 F (36.6 C)   TempSrc: Oral Oral Oral   SpO2: 100% 96%  99%    Intake/Output Summary (Last 24 hours) at 08/13/2020 1559 Last data filed at 08/13/2020 0981 Gross per 24 hour  Intake 480 ml  Output 755 ml  Net -275 ml   There were no vitals filed for this visit.  Examination: General exam: Awake, laying in bed, in nad Respiratory system: Normal respiratory effort, no wheezing Cardiovascular system: regular rate, s1, s2 Gastrointestinal system: Soft, nondistended, positive BS Central nervous system: CN2-12 grossly intact, strength intact Extremities: Perfused, no clubbing Skin: Normal skin turgor, no notable skin lesions seen Psychiatry: Mood normal // no visual hallucinations    Data Reviewed: I have personally reviewed following labs and imaging studies  CBC: Recent Labs  Lab 08/11/20 1924 08/12/20 0455 08/13/20 0219  WBC 4.4 3.6* 3.7*  NEUTROABS  --  2.7  --   HGB 6.3* 7.0* 7.5*  HCT 22.8* 24.9* 26.4*  MCV 75.0* 79.0* 77.4*  PLT 178 131* 191*   Basic Metabolic Panel: Recent Labs  Lab 08/08/20 1430 08/11/20 1924 08/12/20 0455 08/13/20 0219  NA 141 139 139 139  K 5.4* 4.4 4.1 4.9  CL 108* 112* 111 113*  CO2 19* 16* 15* 19*  GLUCOSE 100* 126* 88 96  BUN 21 25* 24* 21  CREATININE 1.79* 2.14* 2.05* 2.04*  CALCIUM 7.9* 7.8* 7.4* 7.8*  MG  --   --  2.5*  --    GFR: Estimated Creatinine Clearance: 22.8 mL/min (A) (by C-G formula based on SCr of 2.04 mg/dL (H)). Liver Function Tests: Recent Labs  Lab 08/12/20 0455 08/13/20 0219  AST 16 14*  ALT 10 9  ALKPHOS 59 63  BILITOT 0.6 0.6  PROT 6.3* 6.3*  ALBUMIN 3.1* 3.1*   No results for input(s): LIPASE, AMYLASE in the last 168  hours. No results for input(s): AMMONIA in the last 168 hours. Coagulation Profile: Recent Labs  Lab 08/12/20 0251  INR 1.4*   Cardiac Enzymes: No results for input(s): CKTOTAL, CKMB, CKMBINDEX, TROPONINI in the last 168 hours. BNP (last 3 results) No results for input(s): PROBNP in the last 8760 hours. HbA1C: Recent Labs    08/12/20 0455  HGBA1C 5.4   CBG: Recent Labs  Lab 08/12/20 2110 08/13/20 0005 08/13/20 0354 08/13/20 0916 08/13/20 1142  GLUCAP 66* 89 89 103* 82   Lipid Profile: No results for input(s): CHOL, HDL, LDLCALC, TRIG, CHOLHDL, LDLDIRECT in the last 72 hours. Thyroid Function Tests: No results for input(s): TSH, T4TOTAL, FREET4, T3FREE, THYROIDAB in the last 72 hours. Anemia Panel: No results for input(s): VITAMINB12, FOLATE, FERRITIN, TIBC, IRON, RETICCTPCT in the last 72 hours. Sepsis Labs: No results for input(s): PROCALCITON, LATICACIDVEN in the last 168 hours.  Recent Results (from  the past 240 hour(s))  Resp Panel by RT-PCR (Flu A&B, Covid) Nasopharyngeal Swab     Status: None   Collection Time: 08/11/20 11:00 PM   Specimen: Nasopharyngeal Swab; Nasopharyngeal(NP) swabs in vial transport medium  Result Value Ref Range Status   SARS Coronavirus 2 by RT PCR NEGATIVE NEGATIVE Final    Comment: (NOTE) SARS-CoV-2 target nucleic acids are NOT DETECTED.  The SARS-CoV-2 RNA is generally detectable in upper respiratory specimens during the acute phase of infection. The lowest concentration of SARS-CoV-2 viral copies this assay can detect is 138 copies/mL. A negative result does not preclude SARS-Cov-2 infection and should not be used as the sole basis for treatment or other patient management decisions. A negative result may occur with  improper specimen collection/handling, submission of specimen other than nasopharyngeal swab, presence of viral mutation(s) within the areas targeted by this assay, and inadequate number of viral copies(<138  copies/mL). A negative result must be combined with clinical observations, patient history, and epidemiological information. The expected result is Negative.  Fact Sheet for Patients:  EntrepreneurPulse.com.au  Fact Sheet for Healthcare Providers:  IncredibleEmployment.be  This test is no t yet approved or cleared by the [place] FDA and  has been authorized for detection and/or diagnosis of SARS-CoV-2 by FDA under an Emergency Use Authorization (EUA). This EUA will remain  in effect (meaning this test can be used) for the duration of the COVID-19 declaration under Section 564(b)(1) of the Act, 21 U.S.C.section 360bbb-3(b)(1), unless the authorization is terminated  or revoked sooner.       Influenza A by PCR NEGATIVE NEGATIVE Final   Influenza B by PCR NEGATIVE NEGATIVE Final    Comment: (NOTE) The Xpert Xpress SARS-CoV-2/FLU/RSV plus assay is intended as an aid in the diagnosis of influenza from Nasopharyngeal swab specimens and should not be used as a sole basis for treatment. Nasal washings and aspirates are unacceptable for Xpert Xpress SARS-CoV-2/FLU/RSV testing.  Fact Sheet for Patients: EntrepreneurPulse.com.au  Fact Sheet for Healthcare Providers: IncredibleEmployment.be  This test is not yet approved or cleared by the [place] FDA and has been authorized for detection and/or diagnosis of SARS-CoV-2 by FDA under an Emergency Use Authorization (EUA). This EUA will remain in effect (meaning this test can be used) for the duration of the COVID-19 declaration under Section 564(b)(1) of the Act, 21 U.S.C. section 360bbb-3(b)(1), unless the authorization is terminated or revoked.  Performed at [facility], [place] [address] [place], [place] [address]      Radiology Studies: DG Chest 2 View  Result Date: 08/11/2020 CLINICAL DATA:  Chest pain EXAM: CHEST - 2 VIEW COMPARISON:   05/09/2016 FINDINGS: The heart size and mediastinal contours are within normal limits. There is left basilar atelectasis. Otherwise, both lungs are clear. The visualized skeletal structures are unremarkable. IMPRESSION: Left basilar atelectasis. Electronically Signed   By: Ulyses Jarred M.D.   On: 08/11/2020 23:04    Scheduled Meds: . atorvastatin  80 mg Oral q1800  . insulin aspart  0-9 Units Subcutaneous Q4H  . pantoprazole (PROTONIX) IV  40 mg Intravenous Q12H   Continuous Infusions: . dextrose 75 mL/hr at 08/13/20 1026     LOS: 1 day   Marylu Lund, MD [facility] Pager On Amion  If 7PM-7AM, please contact night-coverage 08/13/2020, 3:59 PM

## 2020-08-13 NOTE — Progress Notes (Signed)
Hypoglycemic Event  CBG: 52  Treatment: 8 oz juice/soda  Symptoms: None  Follow-up CBG: Time:0005 CBG Result:89  Possible Reasons for Event: Inadequate meal intake  Comments/MD notified: Dr Myna Hidalgo    Yvette Rack

## 2020-08-13 NOTE — Progress Notes (Signed)
Subjective: No complaints.  Nursing did report a minimal amount of melenic stool this AM.  Objective: Vital signs in last 24 hours: Temp:  [97.6 F (36.4 C)-98.6 F (37 C)] 97.8 F (36.6 C) (01/04 1241) Pulse Rate:  [50-68] 50 (01/04 1241) Resp:  [13-16] 15 (01/04 1241) BP: (118-157)/(57-78) 134/61 (01/04 1241) SpO2:  [94 %-100 %] 99 % (01/04 1300) Last BM Date: 08/12/19  Intake/Output from previous day: 01/03 0701 - 01/04 0700 In: 323.8 [P.O.:240; I.V.:83.8] Out: 380 [Urine:380] Intake/Output this shift: Total I/O In: 2231.8 [P.O.:240; I.V.:1991.8] Out: 1125 [Urine:1125]  General appearance: alert and no distress GI: soft, non-tender; bowel sounds normal; no masses,  no organomegaly  Lab Results: Recent Labs    08/11/20 1924 08/12/20 0455 08/13/20 0219  WBC 4.4 3.6* 3.7*  HGB 6.3* 7.0* 7.5*  HCT 22.8* 24.9* 26.4*  PLT 178 131* 129*   BMET Recent Labs    08/11/20 1924 08/12/20 0455 08/13/20 0219  NA 139 139 139  K 4.4 4.1 4.9  CL 112* 111 113*  CO2 16* 15* 19*  GLUCOSE 126* 88 96  BUN 25* 24* 21  CREATININE 2.14* 2.05* 2.04*  CALCIUM 7.8* 7.4* 7.8*   LFT Recent Labs    08/13/20 0219  PROT 6.3*  ALBUMIN 3.1*  AST 14*  ALT 9  ALKPHOS 63  BILITOT 0.6   PT/INR Recent Labs    08/12/20 0251  LABPROT 16.3*  INR 1.4*   Hepatitis Panel No results for input(s): HEPBSAG, HCVAB, HEPAIGM, HEPBIGM in the last 72 hours. C-Diff No results for input(s): CDIFFTOX in the last 72 hours. Fecal Lactopherrin No results for input(s): FECLLACTOFRN in the last 72 hours.  Studies/Results: DG Chest 2 View  Result Date: 08/11/2020 CLINICAL DATA:  Chest pain EXAM: CHEST - 2 VIEW COMPARISON:  05/09/2016 FINDINGS: The heart size and mediastinal contours are within normal limits. There is left basilar atelectasis. Otherwise, both lungs are clear. The visualized skeletal structures are unremarkable. IMPRESSION: Left basilar atelectasis. Electronically Signed   By: Ulyses Jarred M.D.   On: 08/11/2020 23:04    Medications:  Scheduled: . atorvastatin  80 mg Oral q1800  . insulin aspart  0-9 Units Subcutaneous Q4H  . pantoprazole (PROTONIX) IV  40 mg Intravenous Q12H   Continuous: . dextrose 75 mL/hr at 08/13/20 1026    Assessment/Plan: 1) Duodenal ulcers - clean-based. 2) Melenic stool - most likely this is old stool. 3) Anemia - stable.   The patient is clinically stable.  The passage of some melena is representative of old stool as his HGB is stable.  Plan: 1) Advance to a regular diet. 2) Transition to PO pantoprazole BID x 1 month tomorrow. 3) If HGB is stable, he can be discharged home with follow up with Dr. Collene Mares in 2 weeks.  LOS: 1 day   Lonnetta Kniskern D 08/13/2020, 4:18 PM

## 2020-08-13 NOTE — Progress Notes (Signed)
Went into pt's room to meet pt. Pt asked if I could see if he dropped his cell phone on the floor. I checked and the pt had dropped his cell phone on the floor. The phone is a flip phone and was broken in half. I let the pt know that the phone was broken and placed it on his bedside table. Pt states he will ask his son to get him a new one.

## 2020-08-14 DIAGNOSIS — D62 Acute posthemorrhagic anemia: Secondary | ICD-10-CM | POA: Diagnosis not present

## 2020-08-14 DIAGNOSIS — I5032 Chronic diastolic (congestive) heart failure: Secondary | ICD-10-CM | POA: Diagnosis not present

## 2020-08-14 DIAGNOSIS — N1832 Chronic kidney disease, stage 3b: Secondary | ICD-10-CM | POA: Diagnosis not present

## 2020-08-14 DIAGNOSIS — I2510 Atherosclerotic heart disease of native coronary artery without angina pectoris: Secondary | ICD-10-CM | POA: Diagnosis not present

## 2020-08-14 LAB — CBC
HCT: 25.9 % — ABNORMAL LOW (ref 39.0–52.0)
Hemoglobin: 7.9 g/dL — ABNORMAL LOW (ref 13.0–17.0)
MCH: 23.7 pg — ABNORMAL LOW (ref 26.0–34.0)
MCHC: 30.5 g/dL (ref 30.0–36.0)
MCV: 77.8 fL — ABNORMAL LOW (ref 80.0–100.0)
Platelets: 127 10*3/uL — ABNORMAL LOW (ref 150–400)
RBC: 3.33 MIL/uL — ABNORMAL LOW (ref 4.22–5.81)
RDW: 22.9 % — ABNORMAL HIGH (ref 11.5–15.5)
WBC: 3.8 10*3/uL — ABNORMAL LOW (ref 4.0–10.5)
nRBC: 0.5 % — ABNORMAL HIGH (ref 0.0–0.2)

## 2020-08-14 LAB — GLUCOSE, CAPILLARY
Glucose-Capillary: 100 mg/dL — ABNORMAL HIGH (ref 70–99)
Glucose-Capillary: 102 mg/dL — ABNORMAL HIGH (ref 70–99)
Glucose-Capillary: 107 mg/dL — ABNORMAL HIGH (ref 70–99)
Glucose-Capillary: 109 mg/dL — ABNORMAL HIGH (ref 70–99)
Glucose-Capillary: 111 mg/dL — ABNORMAL HIGH (ref 70–99)

## 2020-08-14 LAB — COMPREHENSIVE METABOLIC PANEL
ALT: 9 U/L (ref 0–44)
AST: 15 U/L (ref 15–41)
Albumin: 3.1 g/dL — ABNORMAL LOW (ref 3.5–5.0)
Alkaline Phosphatase: 65 U/L (ref 38–126)
Anion gap: 5 (ref 5–15)
BUN: 16 mg/dL (ref 8–23)
CO2: 21 mmol/L — ABNORMAL LOW (ref 22–32)
Calcium: 7.5 mg/dL — ABNORMAL LOW (ref 8.9–10.3)
Chloride: 111 mmol/L (ref 98–111)
Creatinine, Ser: 2.03 mg/dL — ABNORMAL HIGH (ref 0.61–1.24)
GFR, Estimated: 32 mL/min — ABNORMAL LOW (ref >60)
Glucose, Bld: 131 mg/dL — ABNORMAL HIGH (ref 70–99)
Potassium: 4.8 mmol/L (ref 3.5–5.1)
Sodium: 137 mmol/L (ref 135–145)
Total Bilirubin: 0.8 mg/dL (ref 0.3–1.2)
Total Protein: 6 g/dL — ABNORMAL LOW (ref 6.5–8.1)

## 2020-08-14 LAB — SURGICAL PATHOLOGY

## 2020-08-14 MED ORDER — PANTOPRAZOLE SODIUM 40 MG PO TBEC
40.0000 mg | DELAYED_RELEASE_TABLET | Freq: Two times a day (BID) | ORAL | Status: DC
  Administered 2020-08-15: 40 mg via ORAL
  Filled 2020-08-14: qty 1

## 2020-08-14 NOTE — Progress Notes (Signed)
[facility] PROGRESS NOTE  Trevor Murphy QZE:[id number] DOB: [birth year] DOA: 08/11/2020 PCP: [name], FNP  Status is: The patient will require care spanning > 2 midnights and should be moved to inpatient because: IV treatments appropriate due to intensity of illness or inability to take PO   Dispo: The patient is from: home              Anticipated d/c is to: home              Anticipated d/c date is: 1/6//22              Patient currently not medically stable to d/c   Code Status: full Family Communication: patient DVT prophylaxis: scd  Brief Narrative:  86 year old male with past medical history of coronary artery disease (nonobstructive cath 2011), chronic kidney disease stage IIIb, gout, iron deficiency anemia, diastolic congestive heart failure (Echo 05/2019 EF 60-65%), recent cardioembolic stroke secondary to paroxysmal atrial fibrillation (05/2020) who presents to [facility] emergency department with complaints of chest pain with generalized weakness. In the ED, pt was found to have hgb of 6.3 with heme pos stools   Subjective:      Reports feeling better and slept well. Reports unable to eat food as he is blind and needs help and has no teeth  Objective: Vitals:   08/14/20 0421 08/14/20 1218  BP: 132/67 137/64  Pulse: 64 60  Resp: 18 16  Temp: 97.6 F (36.4 C) 98 F (36.7 C)  SpO2: 100%     Intake/Output Summary (Last 24 hours) at 08/14/2020 1249 Last data filed at 08/14/2020 0800 Gross per 24 hour  Intake 1991.83 ml  Output 1850 ml  Net 141.83 ml   There were no vitals filed for this visit.  Exam:  Constitutional: NAD, calm, comfortable somewhat thin and frail appearing Eyes: PERRL, lids and conjunctivae normal ENMT: Mucous membranes are moist. Posterior pharynx clear of any exudate or lesions. No teeth  Neck: normal, supple, no masses, no thyromegaly Respiratory: clear to auscultation bilaterally, no wheezing, no crackles. Normal  respiratory effort. No accessory muscle use.  Cardiovascular: Regular rate and rhythm, no murmurs / rubs / gallops. No extremity edema. 2+ pedal pulses. No carotid bruits.  Abdomen: no tenderness, no masses palpated. No hepatosplenomegaly. Bowel sounds positive.  Musculoskeletal: no clubbing / cyanosis. No joint deformity upper and lower extremities. Good ROM, no contractures. Normal muscle tone.  Skin: no rashes, lesions, ulcers. No induration Neurologic: CN 2-12 grossly intact. Sensation intact, DTR normal. Strength 5/5 x all 4 extremities.  Psychiatric: Normal judgment and insight. Alert and oriented x 3. Normal mood.    Assessment/Plan: Acute problems: Principal Problem:   Acute blood loss anemia Active Problems:   Essential hypertension   Chronic kidney disease, stage 3b (HCC)   Elevated troponin level not due myocardial infarction   Chronic diastolic CHF (congestive heart failure) (HCC)   Coronary artery disease involving native coronary artery of native heart without angina pectoris   Paroxysmal atrial fibrillation (HCC)   Mixed hyperlipidemia  Acute blood loss anemia secondary to erosive gastropathy   Patient presented on 08/12/20 with 1-1/2 weeks of increasing dyspnea on exertion, lightheadedness upon rising from a seated position and notable substantial drop in hemoglobin to 6.3 on arrival.  Hx of eliquis use for afib with goody powder use as well  Stools heme pos on presentation  Given 1 unit PRBC on presentation  Cont on Protonix 40 mg IV every 12  Conting to hold Eliquis until clear to resume per GI  GI was consulted and pt is now s/p EGD 1/3 with findings of erosive gastropathy with stigmata of recent bleeding. Also two non-bleeding duodenal ulers noted  GI advanced diet today and PPI IV until tomorrow. No Ibuprofen, naproxen, or other NSAID's  Hgb remains stable this AM; no s/sx active bleeding  Will provide food patient can eat to ensure he can tolerate;  provide assistance with feeds as he is basically blind and requested assistance  CBC in am  Transition to po protonix tomorrow  Active Problems:  Chronic kidney disease, stage 3b (HCC)   Slight increase in creatinine and BUN compared to baseline, suspect related to above blood loss anemia  Strict input and output monitoring  Avoiding nephrotoxic agents if at all possible  Renal function remains stable; OP follow up  Elevated troponin level not due myocardial infarction   Patient presented with very slightly elevated troponin on arrival, with serial troponin trending down  Slight elevation in troponin likely secondary to transient supply demand mismatch due to anemia  Monitoring patient on telemetry, no events on tele  Patient presently chest pain free  Chronic diastolic CHF (congestive heart failure) (HCC)   No evidence of cardiogenic volume overload at this time  Coronary artery disease involving native coronary artery of native heart without angina pectoris   Documented history of coronary artery disease with last cardiac catheterization in 2011 revealing nonobstructive disease.  Continue home regimen of statin therapy  Monitoring patient on telemetry  Anticoagulation currently on hold for now in the setting of above blood loss anemia  As needed nitroglycerin for episodic chest pain  Paroxysmal atrial fibrillation (HCC)  Patient currently in controlled sinus rhythm  Holding Eliquis in the setting of concerns for bleeding  Monitoring patient on telemetry  Mixed hyperlipidemia  Continuing home regimen of lipid lowering therapy.    Data Reviewed: Basic Metabolic Panel: Recent Labs  Lab 08/08/20 1430 08/11/20 1924 08/12/20 0455 08/13/20 0219 08/14/20 0226  NA 141 139 139 139 137  K 5.4* 4.4 4.1 4.9 4.8  CL 108* 112* 111 113* 111  CO2 19* 16* 15* 19* 21*  GLUCOSE 100* 126* 88 96 131*  BUN 21 25* 24* 21 16  CREATININE  1.79* 2.14* 2.05* 2.04* 2.03*  CALCIUM 7.9* 7.8* 7.4* 7.8* 7.5*  MG  --   --  2.5*  --   --    Liver Function Tests: Recent Labs  Lab 08/12/20 0455 08/13/20 0219 08/14/20 0226  AST 16 14* 15  ALT 10 9 9   ALKPHOS 59 63 65  BILITOT 0.6 0.6 0.8  PROT 6.3* 6.3* 6.0*  ALBUMIN 3.1* 3.1* 3.1*   No results for input(s): LIPASE, AMYLASE in the last 168 hours. No results for input(s): AMMONIA in the last 168 hours. CBC: Recent Labs  Lab 08/11/20 1924 08/12/20 0455 08/13/20 0219 08/14/20 0226  WBC 4.4 3.6* 3.7* 3.8*  NEUTROABS  --  2.7  --   --   HGB 6.3* 7.0* 7.5* 7.9*  HCT 22.8* 24.9* 26.4* 25.9*  MCV 75.0* 79.0* 77.4* 77.8*  PLT 178 131* 129* 127*   Cardiac Enzymes: No results for input(s): CKTOTAL, CKMB, CKMBINDEX, TROPONINI in the last 168 hours. BNP (last 3 results) No results for input(s): BNP in the last 8760 hours.  ProBNP (last 3 results) No results for input(s): PROBNP in the last 8760 hours.  CBG: Recent Labs  Lab 08/13/20 1626 08/13/20 2013 08/14/20 0000  08/14/20 0416 08/14/20 1105  GLUCAP 100* 114* 102* 107* 111*    Recent Results (from the past 240 hour(s))  Resp Panel by RT-PCR (Flu A&B, Covid) Nasopharyngeal Swab     Status: None   Collection Time: 08/11/20 11:00 PM   Specimen: Nasopharyngeal Swab; Nasopharyngeal(NP) swabs in vial transport medium  Result Value Ref Range Status   SARS Coronavirus 2 by RT PCR NEGATIVE NEGATIVE Final    Comment: (NOTE) SARS-CoV-2 target nucleic acids are NOT DETECTED.  The SARS-CoV-2 RNA is generally detectable in upper respiratory specimens during the acute phase of infection. The lowest concentration of SARS-CoV-2 viral copies this assay can detect is 138 copies/mL. A negative result does not preclude SARS-Cov-2 infection and should not be used as the sole basis for treatment or other patient management decisions. A negative result may occur with  improper specimen collection/handling, submission of specimen  other than nasopharyngeal swab, presence of viral mutation(s) within the areas targeted by this assay, and inadequate number of viral copies(<138 copies/mL). A negative result must be combined with clinical observations, patient history, and epidemiological information. The expected result is Negative.  Fact Sheet for Patients:  EntrepreneurPulse.com.au  Fact Sheet for Healthcare Providers:  IncredibleEmployment.be  This test is no t yet approved or cleared by the [place] FDA and  has been authorized for detection and/or diagnosis of SARS-CoV-2 by FDA under an Emergency Use Authorization (EUA). This EUA will remain  in effect (meaning this test can be used) for the duration of the COVID-19 declaration under Section 564(b)(1) of the Act, 21 U.S.C.section 360bbb-3(b)(1), unless the authorization is terminated  or revoked sooner.       Influenza A by PCR NEGATIVE NEGATIVE Final   Influenza B by PCR NEGATIVE NEGATIVE Final    Comment: (NOTE) The Xpert Xpress SARS-CoV-2/FLU/RSV plus assay is intended as an aid in the diagnosis of influenza from Nasopharyngeal swab specimens and should not be used as a sole basis for treatment. Nasal washings and aspirates are unacceptable for Xpert Xpress SARS-CoV-2/FLU/RSV testing.  Fact Sheet for Patients: EntrepreneurPulse.com.au  Fact Sheet for Healthcare Providers: IncredibleEmployment.be  This test is not yet approved or cleared by the [place] FDA and has been authorized for detection and/or diagnosis of SARS-CoV-2 by FDA under an Emergency Use Authorization (EUA). This EUA will remain in effect (meaning this test can be used) for the duration of the COVID-19 declaration under Section 564(b)(1) of the Act, 21 U.S.C. section 360bbb-3(b)(1), unless the authorization is terminated or revoked.  Performed at [facility], [place] [address], [place],  [place] [address]      Studies: No results found.  Scheduled Meds: . atorvastatin  80 mg Oral q1800  . insulin aspart  0-9 Units Subcutaneous Q4H  . [START ON 08/15/2020] pantoprazole  40 mg Oral BID   Continuous Infusions: . dextrose 75 mL/hr at 08/14/20 0018    Principal Problem:   Acute blood loss anemia Active Problems:   Essential hypertension   Chronic kidney disease, stage 3b (HCC)   Elevated troponin level not due myocardial infarction   Chronic diastolic CHF (congestive heart failure) (HCC)   Coronary artery disease involving native coronary artery of native heart without angina pectoris   Paroxysmal atrial fibrillation ([place])   Mixed hyperlipidemia   Consultants:  [facility]   Procedures:  EGD 08/12/20  Antibiotics:     Time spent: 60 minutes    Mykael Trott M ANP  [facility] 7 am - 330 pm/M-F

## 2020-08-14 NOTE — Evaluation (Signed)
Physical Therapy Evaluation Patient Details Name: Trevor Murphy MRN: [id number] DOB: 03-02-1935 Today's Date: 08/14/2020   History of Present Illness  86 year old male with past medical history of coronary artery disease (nonobstructive cath 2011), chronic kidney disease stage IIIb, gout, iron deficiency anemia, diastolic congestive heart failure (Echo 05/2019 EF 60-65%), recent cardioembolic stroke secondary to paroxysmal atrial fibrillation (05/2020) who presents to [facility] emergency department with complaints of chest pain. Found to have heme+stool and Hgb of 6.3 on arrival    Clinical Impression  Patient received sleeping in bed, easily woken with touch. Patient is pleasant and agreeable to PT assessment. He is mod independent with bed mobility, requires assist with lines due to poor vision. Min assist for sit to stand and min assist for ambulation with RW. Patient will benefit from continued skilled PT while here to improve functional independence and strength for safe return home.      Follow Up Recommendations Home health PT    Equipment Recommendations  None recommended by PT    Recommendations for Other Services       Precautions / Restrictions Precautions Precautions: Fall Restrictions Weight Bearing Restrictions: No      Mobility  Bed Mobility Overal bed mobility: Modified Independent             General bed mobility comments: increased time needed    Transfers Overall transfer level: Needs assistance Equipment used: Rolling walker (2 wheeled) Transfers: Sit to/from Stand Sit to Stand: Min assist         General transfer comment: pulling up on walker, needs min assist for safety  Ambulation/Gait Ambulation/Gait assistance: Min guard Gait Distance (Feet): 150 Feet Assistive device: Rolling walker (2 wheeled) Gait Pattern/deviations: Step-through pattern Gait velocity: slightly decreased   General Gait Details: ambulates with toe out on  left. Poor vision, so requires min verbal cues for safety in environment. No significant fatigue or difficulty reported or noted.  Stairs            Wheelchair Mobility    Modified Rankin (Stroke Patients Only)       Balance Overall balance assessment: Needs assistance Sitting-balance support: Feet supported Sitting balance-Leahy Scale: Good     Standing balance support: Bilateral upper extremity supported;During functional activity Standing balance-Leahy Scale: Good Standing balance comment: reliant on RW, poor vision                             Pertinent Vitals/Pain Pain Assessment: No/denies pain    Home Living Family/patient expects to be discharged to:: Private residence Living Arrangements: Alone Available Help at Discharge: Family;Available PRN/intermittently Type of Home: House Home Access: Level entry     Home Layout: One level Home Equipment: Walker - 2 wheels;Shower seat;Wheelchair - manual;Cane - single point      Prior Function Level of Independence: Independent with assistive device(s)         Comments: poor vision     Hand Dominance   Dominant Hand: Right    Extremity/Trunk Assessment   Upper Extremity Assessment Upper Extremity Assessment: Overall WFL for tasks assessed    Lower Extremity Assessment Lower Extremity Assessment: Overall WFL for tasks assessed    Cervical / Trunk Assessment Cervical / Trunk Assessment: Normal  Communication   Communication: No difficulties  Cognition Arousal/Alertness: Awake/alert Behavior During Therapy: WFL for tasks assessed/performed Overall Cognitive Status: Within Functional Limits for tasks assessed  General Comments      Exercises     Assessment/Plan    PT Assessment Patient needs continued PT services  PT Problem List Decreased mobility;Decreased activity tolerance;Decreased balance       PT Treatment  Interventions Therapeutic activities;Therapeutic exercise;Gait training;Functional mobility training;Patient/family education    PT Goals (Current goals can be found in the Care Plan section)  Acute Rehab PT Goals Patient Stated Goal: to return home PT Goal Formulation: With patient Time For Goal Achievement: 08/21/20 Potential to Achieve Goals: Good    Frequency Min 3X/week   Barriers to discharge Decreased caregiver support      Co-evaluation               AM-PAC PT "6 Clicks" Mobility  Outcome Measure Help needed turning from your back to your side while in a flat bed without using bedrails?: None Help needed moving from lying on your back to sitting on the side of a flat bed without using bedrails?: A Little Help needed moving to and from a bed to a chair (including a wheelchair)?: A Little Help needed standing up from a chair using your arms (e.g., wheelchair or bedside chair)?: A Little Help needed to walk in hospital room?: A Little Help needed climbing 3-5 steps with a railing? : A Lot 6 Click Score: 18    End of Session Equipment Utilized During Treatment: Gait belt Activity Tolerance: Patient tolerated treatment well Patient left: in bed;with call bell/phone within reach;with bed alarm set Nurse Communication: Mobility status PT Visit Diagnosis: Difficulty in walking, not elsewhere classified (R26.2);Muscle weakness (generalized) (M62.81)    Time: 1660-6301 PT Time Calculation (min) (ACUTE ONLY): 16 min   Charges:   PT Evaluation $PT Eval Moderate Complexity: 1 Mod PT Treatments $Gait Training: 8-22 mins        Letty Salvi, PT, GCS 08/14/20,1:56 PM

## 2020-08-15 DIAGNOSIS — N1832 Chronic kidney disease, stage 3b: Secondary | ICD-10-CM | POA: Diagnosis not present

## 2020-08-15 DIAGNOSIS — I2510 Atherosclerotic heart disease of native coronary artery without angina pectoris: Secondary | ICD-10-CM | POA: Diagnosis not present

## 2020-08-15 DIAGNOSIS — D62 Acute posthemorrhagic anemia: Secondary | ICD-10-CM | POA: Diagnosis not present

## 2020-08-15 DIAGNOSIS — I5032 Chronic diastolic (congestive) heart failure: Secondary | ICD-10-CM | POA: Diagnosis not present

## 2020-08-15 LAB — CBC
HCT: 25.2 % — ABNORMAL LOW (ref 39.0–52.0)
Hemoglobin: 7.5 g/dL — ABNORMAL LOW (ref 13.0–17.0)
MCH: 23.4 pg — ABNORMAL LOW (ref 26.0–34.0)
MCHC: 29.8 g/dL — ABNORMAL LOW (ref 30.0–36.0)
MCV: 78.8 fL — ABNORMAL LOW (ref 80.0–100.0)
Platelets: 127 10*3/uL — ABNORMAL LOW (ref 150–400)
RBC: 3.2 MIL/uL — ABNORMAL LOW (ref 4.22–5.81)
RDW: 23.9 % — ABNORMAL HIGH (ref 11.5–15.5)
WBC: 3.9 10*3/uL — ABNORMAL LOW (ref 4.0–10.5)
nRBC: 0 % (ref 0.0–0.2)

## 2020-08-15 LAB — GLUCOSE, CAPILLARY
Glucose-Capillary: 113 mg/dL — ABNORMAL HIGH (ref 70–99)
Glucose-Capillary: 80 mg/dL (ref 70–99)
Glucose-Capillary: 95 mg/dL (ref 70–99)

## 2020-08-15 MED ORDER — PANTOPRAZOLE SODIUM 40 MG PO TBEC: 40.0000 mg | DELAYED_RELEASE_TABLET | Freq: Two times a day (BID) | ORAL | 0 refills | Status: DC

## 2020-08-15 NOTE — Discharge Summary (Signed)
Physician Discharge Summary  Trevor Murphy ERD:[id number] DOB: 17-May-1935 DOA: 08/11/2020  PCP: [name], FNP  Admit date: 08/11/2020 Discharge date: 08/15/2020  Time spent: 45  minutes  Recommendations for Outpatient Follow-up:  1. Follow up with Dr Collene Mares in 2 weeks. Call for appointment 2. Protonix twice daily for 30 days; no Goody powders NSaids 3. Follow up with PCP 2 weeks for evaluation of Hg     Discharge Diagnoses:  Principal Problem:   Acute blood loss anemia Active Problems:   Essential hypertension   Chronic kidney disease, stage 3b (HCC)   Elevated troponin level not due myocardial infarction   Chronic diastolic CHF (congestive heart failure) (HCC)   Coronary artery disease involving native coronary artery of native heart without angina pectoris   Paroxysmal atrial fibrillation ([place])   Mixed hyperlipidemia   Discharge Condition: stable  Diet recommendation: heart healthy  There were no vitals filed for this visit.  History of present illness: per H%P 86 year old male with past medical history of coronary artery disease (nonobstructive cath 2011), chronic kidney disease stage IIIb, gout, iron deficiency anemia, diastolic congestive heart failure (Echo 05/2019 EF 60-65%), recent cardioembolic stroke secondary to paroxysmal atrial fibrillation (05/2020) who presents to [facility] emergency department with complaints of chest pain.  Patient explained that the day prior while he was sitting in his living room watching TV he began to experience chest pain.  Patient described chest pain as midsternal in location, pressure-like in quality and moderate in intensity.  There were no alleviating or exacerbating factors.  Chest pain seemed to spontaneously resolve after approximately 1 hour.  The following morning, patient had another similar episode of chest pain while washing his dishes in the kitchen.  Patient reports that this episode was no different in intensity or  quality compared to the episode yesterday.  Chest discomfort once again resolved after approximately 1 hour.  Upon further questioning, patient stated for approximately  1-1/2 weeks he had been experiencing generalized weakness.  Generalized weakness was initially mild but progressively worsened.  Weakness is worse with exertion and improved with rest.  Weakness has been associated with bouts of lightheadedness, primarily upon rising from a seated position.  Patient denies any associated shortness of breath.  Patient denied any obvious blood in the stool, Tarsha Blando stool or hematemesis as of late.  Patient denied abdominal pain.  Patient denied regular alcohol use.  Patient  admitted to using Goody powder daily despite being on Eliquis that has recently been prescribed since October for paroxysmal atrial fibrillation.  After the second episode of chest discomfort the patient presented to [facility] emergency department for evaluation.  Upon evaluation in the emergency department, patient was found to have a markedly low hemoglobin of 6.3.  Emergency department provider ordered 1 unit of packed red blood cells to be transfused.  Emergency department provider additionally sent secure chat to Dr. Ardis Hughs with gastroenterology to request nonurgent GI consultation in the morning.  Patient was initiated on intravenous Protonix.  The hospitalist group was then called to assess the patient for admission to the hospital.   Hospital Course:  Acute blood loss anemia secondary to erosive gastropathy   Patient presented on 08/12/20 with 1-1/2 weeks of increasing dyspnea on exertion, lightheadedness upon rising from a seated position and notable substantial drop in hemoglobin to 6.3 on arrival.  Hx of eliquis use for afib with goody powder use as well  Stools heme pos on presentation  Given 1 unit  PRBC on presentation  Received Protonix 40 mg IV every 12 for 72 hours  HeldEliquis and per GI may be  resumed at discharge  GI was consulted and pt is now s/p EGD 1/3 with findings of erosive gastropathy with stigmata of recent bleeding. Also two non-bleeding duodenal ulers noted  GI advanced diet 08/14/20 and recommended PPI IV for 1 more day.  No Ibuprofen, naproxen, or other NSAID's  Hgb remained stable after transfusion.No active bleeding  At discharge patient tolerating diet  Active Problems:  Chronic kidney disease, stage 3b (HCC)   Slight increase in creatinine and BUN compared to baseline on admission. Suspect related to above blood loss anemia. At baseline at discharge; OP follow up  Elevated troponin level not due myocardial infarction   Patient presented with very slightly elevated troponin on arrival, with serial troponin trending down  Slight elevation in troponin likely secondary to transient supply demand mismatch due to anemia  Monitoring patient on telemetry, no events on tele  Patient remained chest pain free  Chronic diastolic CHF (congestive heart failure) (HCC)   No evidence of cardiogenic volume overload at this time  Coronary artery disease involving native coronary artery of native heart without angina pectoris   Documented history of coronary artery disease with last cardiac catheterization in 2011 revealing nonobstructive disease.  Continued home regimen of statin therapy  No events on telemetry  Anticoagulation held in the setting of above blood loss anemia. Per GI may resume eliquis at discharge   Paroxysmal atrial fibrillation [facility])  Patient remained in controlled sinus rhythm  Held Eliquis in the setting of concerns for bleeding. Per GI may resume eliquis at discharge  Monitoring patient on telemetry  Mixed hyperlipidemia  Continuing home regimen of lipid lowering therapy.   Procedures:  EGD 08/12/20  Consultations:  Dr Collene Mares GI  Discharge Exam: Vitals:   08/15/20 0015 08/15/20 0426  BP: 135/79  118/61  Pulse: 73 71  Resp: 18 18  Temp: 97.8 F (36.6 C) 98.4 F (36.9 C)  SpO2: 100% 100%    General: alert oriented no acute distress Cardiovascular: rrr no mgr no LE edema Respiratory: normal effort   Discharge Instructions    Allergies as of 08/15/2020   No Known Allergies     Medication List    STOP taking these medications   clopidogrel 75 MG tablet Commonly known as: PLAVIX     TAKE these medications   apixaban 2.5 MG Tabs tablet Commonly known as: Eliquis Take 1 tablet (2.5 mg total) by mouth 2 (two) times daily.   atorvastatin 80 MG tablet Commonly known as: LIPITOR Take 1 tablet (80 mg total) by mouth daily at 6 PM.   ferrous sulfate 325 (65 FE) MG tablet Take 1 tablet (325 mg total) by mouth daily.   Lumigan 0.01 % Soln Generic drug: bimatoprost Place 1 drop into both eyes at bedtime.   pantoprazole 40 MG tablet Commonly known as: PROTONIX Take 1 tablet (40 mg total) by mouth 2 (two) times daily.      No Known Allergies    The results of significant diagnostics from this hospitalization (including imaging, microbiology, ancillary and laboratory) are listed below for reference.    Significant Diagnostic Studies: DG Chest 2 View  Result Date: 08/11/2020 CLINICAL DATA:  Chest pain EXAM: CHEST - 2 VIEW COMPARISON:  05/09/2016 FINDINGS: The heart size and mediastinal contours are within normal limits. There is left basilar atelectasis. Otherwise, both lungs are clear. The visualized skeletal  structures are unremarkable. IMPRESSION: Left basilar atelectasis. Electronically Signed   By: Ulyses Jarred M.D.   On: 08/11/2020 23:04   CUP PACEART REMOTE DEVICE CHECK  Result Date: 07/20/2020 ILR summary report received. Battery status OK. Normal device function. No new symptom, tachy, brady, or pause episodes. No new AF episodes. Monthly summary reports and ROV/PRN   Microbiology: Recent Results (from the past 240 hour(s))  Resp Panel by RT-PCR (Flu  A&B, Covid) Nasopharyngeal Swab     Status: None   Collection Time: 08/11/20 11:00 PM   Specimen: Nasopharyngeal Swab; Nasopharyngeal(NP) swabs in vial transport medium  Result Value Ref Range Status   SARS Coronavirus 2 by RT PCR NEGATIVE NEGATIVE Final    Comment: (NOTE) SARS-CoV-2 target nucleic acids are NOT DETECTED.  The SARS-CoV-2 RNA is generally detectable in upper respiratory specimens during the acute phase of infection. The lowest concentration of SARS-CoV-2 viral copies this assay can detect is 138 copies/mL. A negative result does not preclude SARS-Cov-2 infection and should not be used as the sole basis for treatment or other patient management decisions. A negative result may occur with  improper specimen collection/handling, submission of specimen other than nasopharyngeal swab, presence of viral mutation(s) within the areas targeted by this assay, and inadequate number of viral copies(<138 copies/mL). A negative result must be combined with clinical observations, patient history, and epidemiological information. The expected result is Negative.  Fact Sheet for Patients:  EntrepreneurPulse.com.au  Fact Sheet for Healthcare Providers:  IncredibleEmployment.be  This test is no t yet approved or cleared by the [place] FDA and  has been authorized for detection and/or diagnosis of SARS-CoV-2 by FDA under an Emergency Use Authorization (EUA). This EUA will remain  in effect (meaning this test can be used) for the duration of the COVID-19 declaration under Section 564(b)(1) of the Act, 21 U.S.C.section 360bbb-3(b)(1), unless the authorization is terminated  or revoked sooner.       Influenza A by PCR NEGATIVE NEGATIVE Final   Influenza B by PCR NEGATIVE NEGATIVE Final    Comment: (NOTE) The Xpert Xpress SARS-CoV-2/FLU/RSV plus assay is intended as an aid in the diagnosis of influenza from Nasopharyngeal swab specimens  and should not be used as a sole basis for treatment. Nasal washings and aspirates are unacceptable for Xpert Xpress SARS-CoV-2/FLU/RSV testing.  Fact Sheet for Patients: EntrepreneurPulse.com.au  Fact Sheet for Healthcare Providers: IncredibleEmployment.be  This test is not yet approved or cleared by the [place] FDA and has been authorized for detection and/or diagnosis of SARS-CoV-2 by FDA under an Emergency Use Authorization (EUA). This EUA will remain in effect (meaning this test can be used) for the duration of the COVID-19 declaration under Section 564(b)(1) of the Act, 21 U.S.C. section 360bbb-3(b)(1), unless the authorization is terminated or revoked.  Performed at [facility], [place] [address] [place], [place] [address]      Labs: Basic Metabolic Panel: Recent Labs  Lab 08/08/20 1430 08/11/20 1924 08/12/20 0455 08/13/20 0219 08/14/20 0226  NA 141 139 139 139 137  K 5.4* 4.4 4.1 4.9 4.8  CL 108* 112* 111 113* 111  CO2 19* 16* 15* 19* 21*  GLUCOSE 100* 126* 88 96 131*  BUN 21 25* 24* 21 16  CREATININE 1.79* 2.14* 2.05* 2.04* 2.03*  CALCIUM 7.9* 7.8* 7.4* 7.8* 7.5*  MG  --   --  2.5*  --   --    Liver Function Tests: Recent Labs  Lab 08/12/20 0455 08/13/20  0219 08/14/20 0226  AST 16 14* 15  ALT 10 9 9   ALKPHOS 59 63 65  BILITOT 0.6 0.6 0.8  PROT 6.3* 6.3* 6.0*  ALBUMIN 3.1* 3.1* 3.1*   No results for input(s): LIPASE, AMYLASE in the last 168 hours. No results for input(s): AMMONIA in the last 168 hours. CBC: Recent Labs  Lab 08/11/20 1924 08/12/20 0455 08/13/20 0219 08/14/20 0226 08/15/20 0221  WBC 4.4 3.6* 3.7* 3.8* 3.9*  NEUTROABS  --  2.7  --   --   --   HGB 6.3* 7.0* 7.5* 7.9* 7.5*  HCT 22.8* 24.9* 26.4* 25.9* 25.2*  MCV 75.0* 79.0* 77.4* 77.8* 78.8*  PLT 178 131* 129* 127* 127*   Cardiac Enzymes: No results for input(s): CKTOTAL, CKMB, CKMBINDEX, TROPONINI in the last 168  hours. BNP: BNP (last 3 results) No results for input(s): BNP in the last 8760 hours.  ProBNP (last 3 results) No results for input(s): PROBNP in the last 8760 hours.  CBG: Recent Labs  Lab 08/14/20 1105 08/14/20 1607 08/14/20 2021 08/15/20 0012 08/15/20 0423  GLUCAP 111* 109* 100* 113* 95       Signed:  Radene Gunning NP [facility] 08/15/2020, 8:41 AM

## 2020-08-15 NOTE — Progress Notes (Incomplete)
Pt IV removed, catheter intact. Tele removed, ccmd aware. Pt has all belongings. Pt understands d/c instructions. Pt d/c via wheelchair by

## 2020-08-15 NOTE — TOC Initial Note (Addendum)
Transition of Care [facility]) - Initial/Assessment Note    Patient Details  Name: Trevor Murphy MRN: [id number] Date of Birth: 03-Oct-1934  [facility]) CM/SW Contact:    Marilu Favre, RN Phone Number: 08/15/2020, 10:30 AM  Clinical Narrative:                 Confirmed face sheet information with patient. Patient states he is from [facility] assisted living . Called sister to confirm left voicemail. Called from [place]  Was told they are NOT assisted living , they are independent senior living apartments.   [name] returned call and confirmed he lives in an apartment and not ALF. SHe would like [facility] to call her to schedule visits. Information given to [place] arranged with [facility]   Expected Discharge Plan: [place] Barriers to Discharge: No Barriers Identified   Patient Goals and CMS Choice Patient states their goals for this hospitalization and ongoing recovery are:: to return to home CMS Medicare.gov Compare Post Acute Care list provided to:: Patient Choice offered to / list presented to : Patient  Expected Discharge Plan and Services Expected Discharge Plan: [place]   Discharge Planning Services: CM Consult Post Acute Care Choice: [place] arrangements for the past 2 months: Apartment Expected Discharge Date: 08/15/20               DME Arranged: N/A         HH Arranged: RN,PT [place] Agency: [place] Date [place]: 08/15/20 Time [place]: [id number] Representative spoke with at [place]: Tommi Rumps  Prior Living Arrangements/Services Living arrangements for the past 2 months: Apartment Lives with:: Self Patient language and need for interpreter reviewed:: Yes Do you feel safe going back to the place where you live?: Yes      Need for Family Participation in Patient Care: Yes (Comment) Care giver support system in place?: Yes (comment)   Criminal  Activity/Legal Involvement Pertinent to Current Situation/Hospitalization: No - Comment as needed  Activities of Daily Living Home Assistive Devices/Equipment: Wheelchair ADL Screening (condition at time of admission) Patient's cognitive ability adequate to safely complete daily activities?: Yes Is the patient deaf or have difficulty hearing?: No Does the patient have difficulty seeing, even when wearing glasses/contacts?: Yes Does the patient have difficulty concentrating, remembering, or making decisions?: No Patient able to express need for assistance with ADLs?: Yes Does the patient have difficulty dressing or bathing?: Yes Independently performs ADLs?: No Does the patient have difficulty walking or climbing stairs?: Yes Weakness of Legs: None Weakness of Arms/Hands: Both  Permission Sought/Granted      Share Information with NAME: Sister [name] [phone]  Permission granted to share info w AGENCY: [facility]        Emotional Assessment Appearance:: Appears stated age Attitude/Demeanor/Rapport: Engaged Affect (typically observed): Accepting Orientation: : Oriented to Self,Oriented to Place,Oriented to  Time,Oriented to Situation Alcohol / Substance Use: Not Applicable Psych Involvement: No (comment)  Admission diagnosis:  Acute blood loss anemia [D62] Acute GI bleeding [K92.2] Heme positive stool [R19.5] Anemia, unspecified type [D64.9] Chest pain, unspecified type [R07.9] Patient Active Problem List   Diagnosis Date Noted  . Mixed hyperlipidemia 08/12/2020  . Acute blood loss anemia 08/11/2020  . Paroxysmal atrial fibrillation ([place]) 06/07/2020  . Secondary hypercoagulable state ([place]) 06/07/2020  . Ischemic cerebrovascular accident (CVA) of frontal lobe ([place]) 05/20/2019  . Pain due to onychomycosis  of toenails of both feet 01/20/2019  . Prediabetes 07/22/2018  . Nail abnormality 07/22/2018  . Sinus bradycardia 05/02/2016  . Hypotension due to drugs 05/02/2016  .  History of tachycardia 05/02/2016  . GERD (gastroesophageal reflux disease) 02/05/2015  . Coronary artery disease involving native coronary artery of native heart without angina pectoris 04/20/2014  . Atypical chest pain 04/12/2014  . Hypertensive urgency 03/11/2014  . Chronic diastolic CHF (congestive heart failure) ([place]) 01/30/2014  . Alcohol abuse 01/19/2014  . Elevated troponin level not due myocardial infarction 12/19/2013  . Gout 12/19/2013  . Dyslipidemia 12/16/2013  . Hypertensive crisis 12/16/2013  . Cerebral infarction ([place]) 07/28/2012  . Acute on chronic renal failure ([place]) 07/27/2012  . Essential hypertension 07/26/2012  . Chronic kidney disease, stage 3b ([place]) 07/26/2012  . Poor circulation   . Recurrent ventral hernia 02/16/2012  . Hernia (acquired) (recurrent) 02/10/2011   PCP:  [name], [place] Pharmacy:   [facility] [place], [place] [address] Phone: [phone] Fax: [phone]     Social Determinants of Health (SDOH) Interventions    Readmission Risk Interventions No flowsheet data found.

## 2020-08-18 DIAGNOSIS — D649 Anemia, unspecified: Secondary | ICD-10-CM | POA: Diagnosis not present

## 2020-08-18 DIAGNOSIS — I130 Hypertensive heart and chronic kidney disease with heart failure and stage 1 through stage 4 chronic kidney disease, or unspecified chronic kidney disease: Secondary | ICD-10-CM | POA: Diagnosis not present

## 2020-08-18 DIAGNOSIS — Z8673 Personal history of transient ischemic attack (TIA), and cerebral infarction without residual deficits: Secondary | ICD-10-CM | POA: Diagnosis not present

## 2020-08-18 DIAGNOSIS — Z7901 Long term (current) use of anticoagulants: Secondary | ICD-10-CM | POA: Diagnosis not present

## 2020-08-18 DIAGNOSIS — K269 Duodenal ulcer, unspecified as acute or chronic, without hemorrhage or perforation: Secondary | ICD-10-CM | POA: Diagnosis not present

## 2020-08-18 DIAGNOSIS — E782 Mixed hyperlipidemia: Secondary | ICD-10-CM | POA: Diagnosis not present

## 2020-08-18 DIAGNOSIS — M170 Bilateral primary osteoarthritis of knee: Secondary | ICD-10-CM | POA: Diagnosis not present

## 2020-08-18 DIAGNOSIS — I5032 Chronic diastolic (congestive) heart failure: Secondary | ICD-10-CM | POA: Diagnosis not present

## 2020-08-18 DIAGNOSIS — N1832 Chronic kidney disease, stage 3b: Secondary | ICD-10-CM | POA: Diagnosis not present

## 2020-08-18 DIAGNOSIS — J9811 Atelectasis: Secondary | ICD-10-CM | POA: Diagnosis not present

## 2020-08-18 DIAGNOSIS — H409 Unspecified glaucoma: Secondary | ICD-10-CM | POA: Diagnosis not present

## 2020-08-18 DIAGNOSIS — D509 Iron deficiency anemia, unspecified: Secondary | ICD-10-CM | POA: Diagnosis not present

## 2020-08-18 DIAGNOSIS — M1030 Gout due to renal impairment, unspecified site: Secondary | ICD-10-CM | POA: Diagnosis not present

## 2020-08-18 DIAGNOSIS — I2510 Atherosclerotic heart disease of native coronary artery without angina pectoris: Secondary | ICD-10-CM | POA: Diagnosis not present

## 2020-08-18 DIAGNOSIS — D631 Anemia in chronic kidney disease: Secondary | ICD-10-CM | POA: Diagnosis not present

## 2020-08-18 DIAGNOSIS — Z9181 History of falling: Secondary | ICD-10-CM | POA: Diagnosis not present

## 2020-08-18 DIAGNOSIS — D62 Acute posthemorrhagic anemia: Secondary | ICD-10-CM | POA: Diagnosis not present

## 2020-08-18 DIAGNOSIS — I480 Paroxysmal atrial fibrillation: Secondary | ICD-10-CM | POA: Diagnosis not present

## 2020-08-19 ENCOUNTER — Telehealth: Payer: Self-pay

## 2020-08-19 NOTE — Telephone Encounter (Signed)
Called to do TCM and make hospital f/u call LVM

## 2020-08-20 ENCOUNTER — Ambulatory Visit: Payer: Medicare Other | Attending: Internal Medicine

## 2020-08-20 DIAGNOSIS — Z23 Encounter for immunization: Secondary | ICD-10-CM

## 2020-08-20 NOTE — Progress Notes (Signed)
   Covid-19 Vaccination Clinic  Name:  CABELL LAZENBY    MRN: [id number] DOB: Nov 12, 1934  08/20/2020  Mr. Kirsh was observed post Covid-19 immunization for 15 minutes without incident. He was provided with Vaccine Information Sheet and instruction to access the V-Safe system.   Mr. Hammitt was instructed to call 911 with any severe reactions post vaccine: [name] Kitchen Difficulty breathing  . Swelling of face and throat  . A fast heartbeat  . A bad rash all over body  . Dizziness and weakness   Immunizations Administered    Name Date Dose VIS Date Route   Pfizer COVID-19 Vaccine 08/20/2020  4:43 PM 0.3 mL 05/29/2020 Intramuscular   Manufacturer: [place]   Lot: [id number]   [place]: [id number]

## 2020-08-21 DIAGNOSIS — M170 Bilateral primary osteoarthritis of knee: Secondary | ICD-10-CM | POA: Diagnosis not present

## 2020-08-21 DIAGNOSIS — Z9181 History of falling: Secondary | ICD-10-CM | POA: Diagnosis not present

## 2020-08-21 DIAGNOSIS — I480 Paroxysmal atrial fibrillation: Secondary | ICD-10-CM | POA: Diagnosis not present

## 2020-08-21 DIAGNOSIS — J9811 Atelectasis: Secondary | ICD-10-CM | POA: Diagnosis not present

## 2020-08-21 DIAGNOSIS — Z7901 Long term (current) use of anticoagulants: Secondary | ICD-10-CM | POA: Diagnosis not present

## 2020-08-21 DIAGNOSIS — I130 Hypertensive heart and chronic kidney disease with heart failure and stage 1 through stage 4 chronic kidney disease, or unspecified chronic kidney disease: Secondary | ICD-10-CM | POA: Diagnosis not present

## 2020-08-21 DIAGNOSIS — D62 Acute posthemorrhagic anemia: Secondary | ICD-10-CM | POA: Diagnosis not present

## 2020-08-21 DIAGNOSIS — D649 Anemia, unspecified: Secondary | ICD-10-CM | POA: Diagnosis not present

## 2020-08-21 DIAGNOSIS — E782 Mixed hyperlipidemia: Secondary | ICD-10-CM | POA: Diagnosis not present

## 2020-08-21 DIAGNOSIS — D631 Anemia in chronic kidney disease: Secondary | ICD-10-CM | POA: Diagnosis not present

## 2020-08-21 DIAGNOSIS — K269 Duodenal ulcer, unspecified as acute or chronic, without hemorrhage or perforation: Secondary | ICD-10-CM | POA: Diagnosis not present

## 2020-08-21 DIAGNOSIS — M1030 Gout due to renal impairment, unspecified site: Secondary | ICD-10-CM | POA: Diagnosis not present

## 2020-08-21 DIAGNOSIS — N1832 Chronic kidney disease, stage 3b: Secondary | ICD-10-CM | POA: Diagnosis not present

## 2020-08-21 DIAGNOSIS — Z8673 Personal history of transient ischemic attack (TIA), and cerebral infarction without residual deficits: Secondary | ICD-10-CM | POA: Diagnosis not present

## 2020-08-21 DIAGNOSIS — H409 Unspecified glaucoma: Secondary | ICD-10-CM | POA: Diagnosis not present

## 2020-08-21 DIAGNOSIS — I2510 Atherosclerotic heart disease of native coronary artery without angina pectoris: Secondary | ICD-10-CM | POA: Diagnosis not present

## 2020-08-21 DIAGNOSIS — I5032 Chronic diastolic (congestive) heart failure: Secondary | ICD-10-CM | POA: Diagnosis not present

## 2020-08-21 DIAGNOSIS — D509 Iron deficiency anemia, unspecified: Secondary | ICD-10-CM | POA: Diagnosis not present

## 2020-08-22 ENCOUNTER — Ambulatory Visit (INDEPENDENT_AMBULATORY_CARE_PROVIDER_SITE_OTHER): Payer: Medicare Other

## 2020-08-22 DIAGNOSIS — D62 Acute posthemorrhagic anemia: Secondary | ICD-10-CM | POA: Diagnosis not present

## 2020-08-22 DIAGNOSIS — D631 Anemia in chronic kidney disease: Secondary | ICD-10-CM | POA: Diagnosis not present

## 2020-08-22 DIAGNOSIS — K269 Duodenal ulcer, unspecified as acute or chronic, without hemorrhage or perforation: Secondary | ICD-10-CM | POA: Diagnosis not present

## 2020-08-22 DIAGNOSIS — Z7901 Long term (current) use of anticoagulants: Secondary | ICD-10-CM | POA: Diagnosis not present

## 2020-08-22 DIAGNOSIS — D649 Anemia, unspecified: Secondary | ICD-10-CM | POA: Diagnosis not present

## 2020-08-22 DIAGNOSIS — I480 Paroxysmal atrial fibrillation: Secondary | ICD-10-CM | POA: Diagnosis not present

## 2020-08-22 DIAGNOSIS — E782 Mixed hyperlipidemia: Secondary | ICD-10-CM | POA: Diagnosis not present

## 2020-08-22 DIAGNOSIS — I2510 Atherosclerotic heart disease of native coronary artery without angina pectoris: Secondary | ICD-10-CM | POA: Diagnosis not present

## 2020-08-22 DIAGNOSIS — Z8673 Personal history of transient ischemic attack (TIA), and cerebral infarction without residual deficits: Secondary | ICD-10-CM | POA: Diagnosis not present

## 2020-08-22 DIAGNOSIS — D509 Iron deficiency anemia, unspecified: Secondary | ICD-10-CM | POA: Diagnosis not present

## 2020-08-22 DIAGNOSIS — I5032 Chronic diastolic (congestive) heart failure: Secondary | ICD-10-CM | POA: Diagnosis not present

## 2020-08-22 DIAGNOSIS — M1030 Gout due to renal impairment, unspecified site: Secondary | ICD-10-CM | POA: Diagnosis not present

## 2020-08-22 DIAGNOSIS — J9811 Atelectasis: Secondary | ICD-10-CM | POA: Diagnosis not present

## 2020-08-22 DIAGNOSIS — M170 Bilateral primary osteoarthritis of knee: Secondary | ICD-10-CM | POA: Diagnosis not present

## 2020-08-22 DIAGNOSIS — I639 Cerebral infarction, unspecified: Secondary | ICD-10-CM | POA: Diagnosis not present

## 2020-08-22 DIAGNOSIS — N1832 Chronic kidney disease, stage 3b: Secondary | ICD-10-CM | POA: Diagnosis not present

## 2020-08-22 DIAGNOSIS — H409 Unspecified glaucoma: Secondary | ICD-10-CM | POA: Diagnosis not present

## 2020-08-22 DIAGNOSIS — Z9181 History of falling: Secondary | ICD-10-CM | POA: Diagnosis not present

## 2020-08-22 DIAGNOSIS — I130 Hypertensive heart and chronic kidney disease with heart failure and stage 1 through stage 4 chronic kidney disease, or unspecified chronic kidney disease: Secondary | ICD-10-CM | POA: Diagnosis not present

## 2020-08-22 LAB — CUP PACEART REMOTE DEVICE CHECK
Date Time Interrogation Session: 20220112230800
Implantable Pulse Generator Implant Date: 20201014

## 2020-08-23 ENCOUNTER — Encounter: Payer: Self-pay | Admitting: Hematology and Oncology

## 2020-08-23 ENCOUNTER — Telehealth: Payer: Self-pay | Admitting: *Deleted

## 2020-08-23 ENCOUNTER — Inpatient Hospital Stay (HOSPITAL_BASED_OUTPATIENT_CLINIC_OR_DEPARTMENT_OTHER): Payer: Medicare Other | Admitting: Hematology and Oncology

## 2020-08-23 ENCOUNTER — Telehealth: Payer: Self-pay

## 2020-08-23 ENCOUNTER — Inpatient Hospital Stay: Payer: Medicare Other | Attending: Hematology and Oncology

## 2020-08-23 ENCOUNTER — Other Ambulatory Visit: Payer: Self-pay

## 2020-08-23 VITALS — BP 144/64 | HR 70 | Temp 97.7°F | Resp 16 | Ht 64.0 in | Wt 133.4 lb

## 2020-08-23 DIAGNOSIS — F1010 Alcohol abuse, uncomplicated: Secondary | ICD-10-CM | POA: Diagnosis not present

## 2020-08-23 DIAGNOSIS — Z801 Family history of malignant neoplasm of trachea, bronchus and lung: Secondary | ICD-10-CM | POA: Diagnosis not present

## 2020-08-23 DIAGNOSIS — K922 Gastrointestinal hemorrhage, unspecified: Secondary | ICD-10-CM

## 2020-08-23 DIAGNOSIS — Z803 Family history of malignant neoplasm of breast: Secondary | ICD-10-CM | POA: Diagnosis not present

## 2020-08-23 DIAGNOSIS — Z87891 Personal history of nicotine dependence: Secondary | ICD-10-CM | POA: Diagnosis not present

## 2020-08-23 DIAGNOSIS — N189 Chronic kidney disease, unspecified: Secondary | ICD-10-CM

## 2020-08-23 DIAGNOSIS — Z79899 Other long term (current) drug therapy: Secondary | ICD-10-CM | POA: Diagnosis not present

## 2020-08-23 DIAGNOSIS — I2510 Atherosclerotic heart disease of native coronary artery without angina pectoris: Secondary | ICD-10-CM

## 2020-08-23 DIAGNOSIS — D696 Thrombocytopenia, unspecified: Secondary | ICD-10-CM

## 2020-08-23 DIAGNOSIS — D500 Iron deficiency anemia secondary to blood loss (chronic): Secondary | ICD-10-CM

## 2020-08-23 DIAGNOSIS — Z8673 Personal history of transient ischemic attack (TIA), and cerebral infarction without residual deficits: Secondary | ICD-10-CM | POA: Insufficient documentation

## 2020-08-23 LAB — VITAMIN B12: Vitamin B-12: 233 pg/mL (ref 180–914)

## 2020-08-23 LAB — FOLATE: Folate: 8.4 ng/mL (ref >5.9)

## 2020-08-23 LAB — CBC WITH DIFFERENTIAL (CANCER CENTER ONLY)
Abs Immature Granulocytes: 0.01 10*3/uL (ref 0.00–0.07)
Basophils Absolute: 0 10*3/uL (ref 0.0–0.1)
Basophils Relative: 1 %
Eosinophils Absolute: 0.2 10*3/uL (ref 0.0–0.5)
Eosinophils Relative: 5 %
HCT: 27.2 % — ABNORMAL LOW (ref 39.0–52.0)
Hemoglobin: 7.9 g/dL — ABNORMAL LOW (ref 13.0–17.0)
Immature Granulocytes: 0 %
Lymphocytes Relative: 13 %
Lymphs Abs: 0.5 10*3/uL — ABNORMAL LOW (ref 0.7–4.0)
MCH: 23.5 pg — ABNORMAL LOW (ref 26.0–34.0)
MCHC: 29 g/dL — ABNORMAL LOW (ref 30.0–36.0)
MCV: 81 fL (ref 80.0–100.0)
Monocytes Absolute: 0.4 10*3/uL (ref 0.1–1.0)
Monocytes Relative: 9 %
Neutro Abs: 2.8 10*3/uL (ref 1.7–7.7)
Neutrophils Relative %: 72 %
Platelet Count: 204 10*3/uL (ref 150–400)
RBC: 3.36 MIL/uL — ABNORMAL LOW (ref 4.22–5.81)
RDW: 24.8 % — ABNORMAL HIGH (ref 11.5–15.5)
WBC Count: 3.9 10*3/uL — ABNORMAL LOW (ref 4.0–10.5)
nRBC: 0 % (ref 0.0–0.2)

## 2020-08-23 LAB — CMP (CANCER CENTER ONLY)
ALT: 12 U/L (ref 0–44)
AST: 16 U/L (ref 15–41)
Albumin: 3.5 g/dL (ref 3.5–5.0)
Alkaline Phosphatase: 83 U/L (ref 38–126)
Anion gap: 7 (ref 5–15)
BUN: 20 mg/dL (ref 8–23)
CO2: 22 mmol/L (ref 22–32)
Calcium: 7.7 mg/dL — ABNORMAL LOW (ref 8.9–10.3)
Chloride: 113 mmol/L — ABNORMAL HIGH (ref 98–111)
Creatinine: 1.67 mg/dL — ABNORMAL HIGH (ref 0.61–1.24)
GFR, Estimated: 40 mL/min — ABNORMAL LOW (ref >60)
Glucose, Bld: 115 mg/dL — ABNORMAL HIGH (ref 70–99)
Potassium: 4.7 mmol/L (ref 3.5–5.1)
Sodium: 142 mmol/L (ref 135–145)
Total Bilirubin: 0.5 mg/dL (ref 0.3–1.2)
Total Protein: 7.3 g/dL (ref 6.5–8.1)

## 2020-08-23 LAB — RETIC PANEL
Immature Retic Fract: 23.5 % — ABNORMAL HIGH (ref 2.3–15.9)
RBC.: 3.44 MIL/uL — ABNORMAL LOW (ref 4.22–5.81)
Retic Count, Absolute: 64 10*3/uL (ref 19.0–186.0)
Retic Ct Pct: 1.9 % (ref 0.4–3.1)
Reticulocyte Hemoglobin: 21.4 pg — ABNORMAL LOW (ref >27.9)

## 2020-08-23 LAB — SAVE SMEAR(SSMR), FOR PROVIDER SLIDE REVIEW

## 2020-08-23 LAB — LACTATE DEHYDROGENASE: LDH: 212 U/L — ABNORMAL HIGH (ref 98–192)

## 2020-08-23 NOTE — Telephone Encounter (Signed)
2nd to call pt to do TCM call and schedule appt. Left pt vm to call the office [facility]

## 2020-08-23 NOTE — Progress Notes (Signed)
[place] [phone]   Fax:([phone] [place] NOTE  Patient Care Team: [name], FNP as PCP - General (General Practice)  Hematological/Oncological History # Iron Deficiency Anemia in the Setting of GI Bleeding 08/12/2020: patient underwent an EGD which showed two non-bleeding cratered duodenal ulcers and multiple dispersed erosions with stigmata of recent bleeding. Hgb 7.0, MCV 79 08/23/2020: establish care with Dr. Lorenso Courier   CHIEF COMPLAINTS/PURPOSE OF CONSULTATION:  " Iron Deficiency Anemia in the Setting of GI Bleeding "  HISTORY OF PRESENTING ILLNESS:  Trevor Murphy 86 y.o. male with medical history significant for CAD, CKD, CVA, GI ulcers/erosions (noted in Jan 2022), and history of past ETOH abuse who presents for evaluation of iron deficiency anemia.   On review of the previous records [name] had a history of normal hemoglobin's with his most recent normal hemoglobin on 05/09/2016 at 17.2 (he is actually mildly polycythemic).  His hemoglobin again to trend downward in December 2019 at which time his hemoglobin was 11.7 with MCV of 78.  It continued to trend downward from there up to 10.7 in October 2020 and 9.2 in August 2021.  Most recently on 08/11/2020 the patient was found to have a hemoglobin of 6.3.  He received a transfusion on 08/12/2020 of 1 unit of packed red blood cells and underwent an endoscopy which had findings concerning for 2 nonbleeding cratered duodenal ulcers and multiple dispersed erosions with stigmata of recent bleeding in the gastric antrum.  Due to concern for these findings and his iron deficiency and anemia he was referred to hematology for further evaluation and management.  On exam today [place] is accompanied by his sister.  He first noticed that something went wrong when he fell at home.  He notes that he was lightheaded and dizzy because he "lost so much blood".  He notes that he is now feeling better because of the  medications he was started on including an antacid.  He has been on iron pills and has been taking these since August.  The etiology of his GI bleed is thought to be Goody's powder.  When he has aches and pains he notes he takes about 3 to 4 packs or more at a time.  He notes that he currently does not have any issues with fatigue, shortness of breath, he notes that his energy is "so-so".  He notes that his stools have been dark with the iron therapy but before he started taking iron he was not having any abnormalities.  On further discussion he notes that his family history is remarkable for breast cancer in his sister, lung cancer in his brother, and type 2 diabetes in his father.  He is a former smoker but has not had a drink for 7 to 8 years.  He knows that he is a never smoker and previously worked moving houses.  He currently denies any fevers, chills, sweats, nausea, vomiting or diarrhea.  Full 10 point ROS is listed below.  MEDICAL HISTORY:  Past Medical History:  Diagnosis Date  . Arthritis   . CAD (coronary artery disease)    a. Cath 08/2009: minimal nonobstructive disease (25% LAD). b. Lexiscan nuc 6/215: EF 54%, no ischemia or infarction.  . Chronic diastolic CHF (congestive heart failure) ([place])    a. Echo 2013: EF 55-60%, mild AI, mild MR. b. Echo 5/15 with EF 60-65%, no WMA, mild LVH, mild AI.  [name] Kitchen CKD (chronic kidney disease) stage 3, GFR 30-59  ml/min ([place]) 07/26/2012   a. Felt likely due to HTN.  . CVA (cerebral infarction) 07/28/2012  . Glaucoma   . Gout   . H/O umbilical hernia repair   . Headache(784.0)    " WHEN MY BLOOD PRESSURE IS UP"  . History of acute renal failure   . History of alcohol abuse   . HTN (hypertension)   . Hypertension    a. Patent renal arteries 2005.  [name] Kitchen Noncompliance   . OA (osteoarthritis)   . Other and unspecified hyperlipidemia   . Poor circulation   . Sinus bradycardia   . SVT (supraventricular tachycardia) ([place])    a. Possible AVNRT.  Got  adenosine from EMS in 2015 for SVT.  During 5/15 hospitalization had episode of short R-P tachycardia (possible AVNRT) terminated by adenosine.  b. 04/2016 - recurrent tachycardia (SVT vs. AVNRT), again terminated by Adenosine  . Visual impairment     SURGICAL HISTORY: Past Surgical History:  Procedure Laterality Date  . APPENDECTOMY    . BIOPSY  08/12/2020   Procedure: BIOPSY;  Surgeon: Juanita Craver, MD;  Location: [facility] ENDOSCOPY;  Service: Endoscopy;;  . BUBBLE STUDY  05/24/2019   Procedure: BUBBLE STUDY;  Surgeon: Donato Heinz, MD;  Location: [place];  Service: Endoscopy;;  . CARDIAC CATHETERIZATION  11/2003   EF 50-55%  . CARDIAC CATHETERIZATION  08/2009  . CATARACT EXTRACTION EXTRACAPSULAR Right 05/17/2013   Procedure: CATARACT EXTRACTION EXTRACAPSULAR WITH INTRAOCULAR LENS PLACEMENT (IOC) RIGHT EYE;  Surgeon: Marylynn Pearson, MD;  Location: [place];  Service: Ophthalmology;  Laterality: Right;  . COLONOSCOPY    . ESOPHAGOGASTRODUODENOSCOPY (EGD) WITH PROPOFOL N/A 08/12/2020   Procedure: ESOPHAGOGASTRODUODENOSCOPY (EGD) WITH PROPOFOL;  Surgeon: Juanita Craver, MD;  Location: [facility] ENDOSCOPY;  Service: Endoscopy;  Laterality: N/A;  . HERNIA REPAIR    . LOOP RECORDER INSERTION N/A 05/24/2019   Procedure: LOOP RECORDER INSERTION;  Surgeon: Constance Haw, MD;  Location: [place] CV LAB;  Service: Cardiovascular;  Laterality: N/A;  . TEE WITHOUT CARDIOVERSION N/A 05/24/2019   Procedure: TRANSESOPHAGEAL ECHOCARDIOGRAM (TEE);  Surgeon: Donato Heinz, MD;  Location: [facility] ENDOSCOPY;  Service: Endoscopy;  Laterality: N/A;  . VENTRAL HERNIA REPAIR  03/07/2012   Procedure: LAPAROSCOPIC VENTRAL HERNIA;  Surgeon: Gwenyth Ober, MD;  Location: [place];  Service: General;  Laterality: N/A;    SOCIAL HISTORY: Social History   Socioeconomic History  . Marital status: Married    Spouse name: Not on file  . Number of children: Not on file  . Years of education: Not on file  .  Highest education level: Not on file  Occupational History  . Occupation: retired  Tobacco Use  . Smoking status: Never Smoker  . Smokeless tobacco: Never Used  Vaping Use  . Vaping Use: Never used  Substance and Sexual Activity  . Alcohol use: No    Alcohol/week: 1.0 standard drink    Types: 1 Cans of beer per week    Comment: h/o heavy use, quit in 2017  . Drug use: No  . Sexual activity: Not Currently  Other Topics Concern  . Not on file  Social History Narrative  . Not on file   Social Determinants of Health   Financial Resource Strain: Not on file  Food Insecurity: Not on file  Transportation Needs: Not on file  Physical Activity: Not on file  Stress: Not on file  Social Connections: Not on file  Intimate Partner Violence: Not on file    FAMILY  HISTORY: Family History  Problem Relation Age of Onset  . CVA Mother   . Hypertension Brother   . Hypertension Sister   . Hypertension Brother   . Stroke Sister   . Heart attack Neg Hx     ALLERGIES:  has No Known Allergies.  MEDICATIONS:  Current Outpatient Medications  Medication Sig Dispense Refill  . apixaban (ELIQUIS) 2.5 MG TABS tablet Take 1 tablet (2.5 mg total) by mouth 2 (two) times daily. (Patient not taking: Reported on 08/23/2020) 60 tablet 11  . atorvastatin (LIPITOR) 80 MG tablet Take 1 tablet (80 mg total) by mouth daily at 6 PM. 90 tablet 1  . ferrous sulfate 325 (65 FE) MG tablet Take 1 tablet (325 mg total) by mouth daily. 90 tablet 1  . LUMIGAN 0.01 % SOLN Place 1 drop into both eyes at bedtime.    . pantoprazole (PROTONIX) 40 MG tablet Take 1 tablet (40 mg total) by mouth 2 (two) times daily. 60 tablet 0   No current facility-administered medications for this visit.    REVIEW OF SYSTEMS:   Constitutional: ( - ) fevers, ( - )  chills , ( - ) night sweats Eyes: ( - ) blurriness of vision, ( - ) double vision, ( - ) watery eyes Ears, nose, mouth, throat, and face: ( - ) mucositis, ( - ) sore  throat Respiratory: ( - ) cough, ( - ) dyspnea, ( - ) wheezes Cardiovascular: ( - ) palpitation, ( - ) chest discomfort, ( - ) lower extremity swelling Gastrointestinal:  ( - ) nausea, ( - ) heartburn, ( - ) change in bowel habits Skin: ( - ) abnormal skin rashes Lymphatics: ( - ) new lymphadenopathy, ( - ) easy bruising Neurological: ( - ) numbness, ( - ) tingling, ( - ) new weaknesses Behavioral/Psych: ( - ) mood change, ( - ) new changes  All other systems were reviewed with the patient and are negative.  PHYSICAL EXAMINATION:  Vitals:   08/23/20 1436  BP: (!) 144/64  Pulse: 70  Resp: 16  Temp: 97.7 F (36.5 C)  SpO2: 100%   Filed Weights   08/23/20 1436  Weight: 133 lb 6.4 oz (60.5 kg)    GENERAL: well appearing elderly African American male in NAD  SKIN: skin color, texture, turgor are normal, no rashes or significant lesions EYES: conjunctiva are pink and non-injected, sclera clear LUNGS: clear to auscultation and percussion with normal breathing effort HEART: regular rate & rhythm and no murmurs and no lower extremity edema Musculoskeletal: no cyanosis of digits and no clubbing  PSYCH: alert & oriented x 3, fluent speech NEURO: no focal motor/sensory deficits  LABORATORY DATA:  I have reviewed the data as listed CBC Latest Ref Rng & Units 08/23/2020 08/15/2020 08/14/2020  WBC 4.0 - 10.5 K/uL 3.9(L) 3.9(L) 3.8(L)  Hemoglobin 13.0 - 17.0 g/dL 7.9(L) 7.5(L) 7.9(L)  Hematocrit 39.0 - 52.0 % 27.2(L) 25.2(L) 25.9(L)  Platelets 150 - 400 K/uL 204 127(L) 127(L)    CMP Latest Ref Rng & Units 08/23/2020 08/14/2020 08/13/2020  Glucose 70 - 99 mg/dL 115(H) 131(H) 96  BUN 8 - 23 mg/dL 20 16 21   Creatinine 0.61 - 1.24 mg/dL 1.67(H) 2.03(H) 2.04(H)  Sodium 135 - 145 mmol/L 142 137 139  Potassium 3.5 - 5.1 mmol/L 4.7 4.8 4.9  Chloride 98 - 111 mmol/L 113(H) 111 113(H)  CO2 22 - 32 mmol/L 22 21(L) 19(L)  Calcium 8.9 - 10.3 mg/dL 7.7(L) 7.5(L) 7.8(L)  Total Protein 6.5 - 8.1 g/dL 7.3  6.0(L) 6.3(L)  Total Bilirubin 0.3 - 1.2 mg/dL 0.5 0.8 0.6  Alkaline Phos 38 - 126 U/L 83 65 63  AST 15 - 41 U/L 16 15 14(L)  ALT 0 - 44 U/L 12 9 9     RADIOGRAPHIC STUDIES: DG Chest 2 View  Result Date: 08/11/2020 CLINICAL DATA:  Chest pain EXAM: CHEST - 2 VIEW COMPARISON:  05/09/2016 FINDINGS: The heart size and mediastinal contours are within normal limits. There is left basilar atelectasis. Otherwise, both lungs are clear. The visualized skeletal structures are unremarkable. IMPRESSION: Left basilar atelectasis. Electronically Signed   By: Ulyses Jarred M.D.   On: 08/11/2020 23:04   CUP PACEART REMOTE DEVICE CHECK  Result Date: 08/22/2020 ILR summary report received. Battery status OK. Normal device function. No new symptom, tachy, brady, or pause episodes. One new AF episode that was not a true event but SR with ectopy. Monthly summary reports and ROV/PRN Kathy Breach, RN, CCDS, [employer]   ASSESSMENT & PLAN Trevor Murphy 86 y.o. male with medical history significant for CAD, CKD, CVA, GI ulcers/erosions (noted in Jan 2022), and history of past ETOH abuse who presents for evaluation of iron deficiency anemia.  After review the labs, the records, discussed with the patient the findings most consistent with an iron deficiency anemia in the setting of GI bleeding.  The patient took extensive amounts of Goody's powder and was found to have duodenal ulcers as well as erosions.  This is the most likely cause of the patient's iron deficiency anemia.  This may have been a chronic issue and have been slightly declining his hemoglobin since December 2019 was first noted to be low at 11.7.  We will repeat his iron studies today as the results of his iron labs in late December did not make much sense.  In the event the patient is found to have continued iron deficiency anemia I would recommend we proceed with IV Feraheme 510 mg q. 7 days x 2 doses.  We will plan to see him back in approximately  4 to 6 weeks after the last dose in order to evaluate.  In the event that iron-deficiency anemia is not the cause of his current findings I recommend we have him return for work-up of an alternative etiology.  # Iron Deficiency Anemia in the Setting of GI Bleeding -- Most concerning for 9 deficiency anemia in the setting of GI bleed.  As such we will order a CBC, CMP, reticulocyte panel, iron panel, and ferritin. -- Patient has had a slowly declining hemoglobin since 2019.  There may be alternative etiologies and as such we will order LDH, multiple [place] panel, and serum free light chains. -- May be a component of CKD contributing to the patient's anemia.  Can consider erythropoietin levels if iron deficiency does not adequately explain the extent of the patient's anemia. -- Additional nutritional labs with folic acid and vitamin B12. -- Given the patient has been on p.o. iron since August my recommendation would be for IV Feraheme 510 mg q. 7 days x 2 doses if he is found to have continued iron deficiency. -- Plan for return to clinic in approximately 4 to 6 weeks time after IV iron therapy or sooner if work-up for alternative etiology is required.  Orders Placed This Encounter  Procedures  . CBC with Differential (Cancer Center Only)    Standing Status:   Future    Number  of Occurrences:   1    Standing Expiration Date:   08/23/2021  . Retic Panel    Standing Status:   Future    Number of Occurrences:   1    Standing Expiration Date:   08/23/2021  . CMP ([place] only)    Standing Status:   Future    Number of Occurrences:   1    Standing Expiration Date:   08/23/2021  . Ferritin    Standing Status:   Future    Number of Occurrences:   1    Standing Expiration Date:   08/23/2021  . Iron and TIBC    Standing Status:   Future    Number of Occurrences:   1    Standing Expiration Date:   08/23/2021  . Vitamin B12    Standing Status:   Future    Number of Occurrences:   1    Standing  Expiration Date:   08/23/2021  . Folate, Serum    Standing Status:   Future    Number of Occurrences:   1    Standing Expiration Date:   08/23/2021  . Multiple Myeloma Panel (SPEP&IFE w/QIG)    Standing Status:   Future    Number of Occurrences:   1    Standing Expiration Date:   08/23/2021  . Kappa/lambda light chains    Standing Status:   Future    Number of Occurrences:   1    Standing Expiration Date:   08/23/2021  . Lactate dehydrogenase (LDH)    Standing Status:   Future    Number of Occurrences:   1    Standing Expiration Date:   08/23/2021  . Save Smear (SSMR)    Standing Status:   Future    Number of Occurrences:   1    Standing Expiration Date:   08/23/2021    All questions were answered. The patient knows to call the clinic with any problems, questions or concerns.  A total of more than 60 minutes were spent on this encounter and over half of that time was spent on counseling and coordination of care as outlined above.   Ledell Peoples, MD Department of Hematology/Oncology [place] at [facility] Phone: [phone] Pager: [phone] Email: [email or web address]  08/23/2020 5:21 PM

## 2020-08-23 NOTE — Telephone Encounter (Signed)
TCT patient regarding today's appt. He did not show for 1pm time. Spoke with his sister and she said they are on their way and should be here shortly. Dr. Lorenso Courier made aware

## 2020-08-26 LAB — MULTIPLE MYELOMA PANEL, SERUM
Albumin SerPl Elph-Mcnc: 3.8 g/dL (ref 2.9–4.4)
Albumin/Glob SerPl: 1.3 (ref 0.7–1.7)
Alpha 1: 0.3 g/dL (ref 0.0–0.4)
Alpha2 Glob SerPl Elph-Mcnc: 0.5 g/dL (ref 0.4–1.0)
B-Globulin SerPl Elph-Mcnc: 1 g/dL (ref 0.7–1.3)
Gamma Glob SerPl Elph-Mcnc: 1.3 g/dL (ref 0.4–1.8)
Globulin, Total: 3.1 g/dL (ref 2.2–3.9)
IgA: 288 mg/dL (ref 61–437)
IgG (Immunoglobin G), Serum: 1458 mg/dL (ref 603–1613)
IgM (Immunoglobulin M), Srm: 69 mg/dL (ref 15–143)
Total Protein ELP: 6.9 g/dL (ref 6.0–8.5)

## 2020-08-26 LAB — IRON AND TIBC
Iron: 141 ug/dL (ref 42–163)
Saturation Ratios: 49 % (ref 20–55)
TIBC: 288 ug/dL (ref 202–409)
UIBC: 147 ug/dL (ref 117–376)

## 2020-08-26 LAB — KAPPA/LAMBDA LIGHT CHAINS
Kappa free light chain: 61.1 mg/L — ABNORMAL HIGH (ref 3.3–19.4)
Kappa, lambda light chain ratio: 1.76 — ABNORMAL HIGH (ref 0.26–1.65)
Lambda free light chains: 34.8 mg/L — ABNORMAL HIGH (ref 5.7–26.3)

## 2020-08-26 LAB — FERRITIN: Ferritin: 44 ng/mL (ref 24–336)

## 2020-08-27 ENCOUNTER — Telehealth: Payer: Self-pay | Admitting: Hematology and Oncology

## 2020-08-27 NOTE — Telephone Encounter (Signed)
Scheduled follow-up appointment per 1/14 los. Patient's sister is aware.

## 2020-08-28 DIAGNOSIS — D509 Iron deficiency anemia, unspecified: Secondary | ICD-10-CM | POA: Diagnosis not present

## 2020-08-28 DIAGNOSIS — K269 Duodenal ulcer, unspecified as acute or chronic, without hemorrhage or perforation: Secondary | ICD-10-CM | POA: Diagnosis not present

## 2020-08-28 DIAGNOSIS — Z9181 History of falling: Secondary | ICD-10-CM | POA: Diagnosis not present

## 2020-08-28 DIAGNOSIS — I480 Paroxysmal atrial fibrillation: Secondary | ICD-10-CM | POA: Diagnosis not present

## 2020-08-28 DIAGNOSIS — M170 Bilateral primary osteoarthritis of knee: Secondary | ICD-10-CM | POA: Diagnosis not present

## 2020-08-28 DIAGNOSIS — E782 Mixed hyperlipidemia: Secondary | ICD-10-CM | POA: Diagnosis not present

## 2020-08-28 DIAGNOSIS — I2510 Atherosclerotic heart disease of native coronary artery without angina pectoris: Secondary | ICD-10-CM | POA: Diagnosis not present

## 2020-08-28 DIAGNOSIS — I130 Hypertensive heart and chronic kidney disease with heart failure and stage 1 through stage 4 chronic kidney disease, or unspecified chronic kidney disease: Secondary | ICD-10-CM | POA: Diagnosis not present

## 2020-08-28 DIAGNOSIS — D649 Anemia, unspecified: Secondary | ICD-10-CM | POA: Diagnosis not present

## 2020-08-28 DIAGNOSIS — M1030 Gout due to renal impairment, unspecified site: Secondary | ICD-10-CM | POA: Diagnosis not present

## 2020-08-28 DIAGNOSIS — I5032 Chronic diastolic (congestive) heart failure: Secondary | ICD-10-CM | POA: Diagnosis not present

## 2020-08-28 DIAGNOSIS — D62 Acute posthemorrhagic anemia: Secondary | ICD-10-CM | POA: Diagnosis not present

## 2020-08-28 DIAGNOSIS — Z7901 Long term (current) use of anticoagulants: Secondary | ICD-10-CM | POA: Diagnosis not present

## 2020-08-28 DIAGNOSIS — D631 Anemia in chronic kidney disease: Secondary | ICD-10-CM | POA: Diagnosis not present

## 2020-08-28 DIAGNOSIS — J9811 Atelectasis: Secondary | ICD-10-CM | POA: Diagnosis not present

## 2020-08-28 DIAGNOSIS — H409 Unspecified glaucoma: Secondary | ICD-10-CM | POA: Diagnosis not present

## 2020-08-28 DIAGNOSIS — Z8673 Personal history of transient ischemic attack (TIA), and cerebral infarction without residual deficits: Secondary | ICD-10-CM | POA: Diagnosis not present

## 2020-08-28 DIAGNOSIS — N1832 Chronic kidney disease, stage 3b: Secondary | ICD-10-CM | POA: Diagnosis not present

## 2020-09-03 DIAGNOSIS — D509 Iron deficiency anemia, unspecified: Secondary | ICD-10-CM | POA: Diagnosis not present

## 2020-09-03 DIAGNOSIS — E782 Mixed hyperlipidemia: Secondary | ICD-10-CM | POA: Diagnosis not present

## 2020-09-03 DIAGNOSIS — M1030 Gout due to renal impairment, unspecified site: Secondary | ICD-10-CM | POA: Diagnosis not present

## 2020-09-03 DIAGNOSIS — H409 Unspecified glaucoma: Secondary | ICD-10-CM | POA: Diagnosis not present

## 2020-09-03 DIAGNOSIS — Z8673 Personal history of transient ischemic attack (TIA), and cerebral infarction without residual deficits: Secondary | ICD-10-CM | POA: Diagnosis not present

## 2020-09-03 DIAGNOSIS — N1832 Chronic kidney disease, stage 3b: Secondary | ICD-10-CM | POA: Diagnosis not present

## 2020-09-03 DIAGNOSIS — K269 Duodenal ulcer, unspecified as acute or chronic, without hemorrhage or perforation: Secondary | ICD-10-CM | POA: Diagnosis not present

## 2020-09-03 DIAGNOSIS — D631 Anemia in chronic kidney disease: Secondary | ICD-10-CM | POA: Diagnosis not present

## 2020-09-03 DIAGNOSIS — I130 Hypertensive heart and chronic kidney disease with heart failure and stage 1 through stage 4 chronic kidney disease, or unspecified chronic kidney disease: Secondary | ICD-10-CM | POA: Diagnosis not present

## 2020-09-03 DIAGNOSIS — I2510 Atherosclerotic heart disease of native coronary artery without angina pectoris: Secondary | ICD-10-CM | POA: Diagnosis not present

## 2020-09-03 DIAGNOSIS — Z9181 History of falling: Secondary | ICD-10-CM | POA: Diagnosis not present

## 2020-09-03 DIAGNOSIS — Z7901 Long term (current) use of anticoagulants: Secondary | ICD-10-CM | POA: Diagnosis not present

## 2020-09-03 DIAGNOSIS — I480 Paroxysmal atrial fibrillation: Secondary | ICD-10-CM | POA: Diagnosis not present

## 2020-09-03 DIAGNOSIS — D649 Anemia, unspecified: Secondary | ICD-10-CM | POA: Diagnosis not present

## 2020-09-03 DIAGNOSIS — I5032 Chronic diastolic (congestive) heart failure: Secondary | ICD-10-CM | POA: Diagnosis not present

## 2020-09-03 DIAGNOSIS — J9811 Atelectasis: Secondary | ICD-10-CM | POA: Diagnosis not present

## 2020-09-03 DIAGNOSIS — D62 Acute posthemorrhagic anemia: Secondary | ICD-10-CM | POA: Diagnosis not present

## 2020-09-03 DIAGNOSIS — M170 Bilateral primary osteoarthritis of knee: Secondary | ICD-10-CM | POA: Diagnosis not present

## 2020-09-05 DIAGNOSIS — D631 Anemia in chronic kidney disease: Secondary | ICD-10-CM | POA: Diagnosis not present

## 2020-09-05 DIAGNOSIS — K269 Duodenal ulcer, unspecified as acute or chronic, without hemorrhage or perforation: Secondary | ICD-10-CM | POA: Diagnosis not present

## 2020-09-05 DIAGNOSIS — J9811 Atelectasis: Secondary | ICD-10-CM | POA: Diagnosis not present

## 2020-09-05 DIAGNOSIS — Z9181 History of falling: Secondary | ICD-10-CM | POA: Diagnosis not present

## 2020-09-05 DIAGNOSIS — I130 Hypertensive heart and chronic kidney disease with heart failure and stage 1 through stage 4 chronic kidney disease, or unspecified chronic kidney disease: Secondary | ICD-10-CM | POA: Diagnosis not present

## 2020-09-05 DIAGNOSIS — M170 Bilateral primary osteoarthritis of knee: Secondary | ICD-10-CM | POA: Diagnosis not present

## 2020-09-05 DIAGNOSIS — Z7901 Long term (current) use of anticoagulants: Secondary | ICD-10-CM | POA: Diagnosis not present

## 2020-09-05 DIAGNOSIS — I2510 Atherosclerotic heart disease of native coronary artery without angina pectoris: Secondary | ICD-10-CM | POA: Diagnosis not present

## 2020-09-05 DIAGNOSIS — I480 Paroxysmal atrial fibrillation: Secondary | ICD-10-CM | POA: Diagnosis not present

## 2020-09-05 DIAGNOSIS — I5032 Chronic diastolic (congestive) heart failure: Secondary | ICD-10-CM | POA: Diagnosis not present

## 2020-09-05 DIAGNOSIS — D62 Acute posthemorrhagic anemia: Secondary | ICD-10-CM | POA: Diagnosis not present

## 2020-09-05 DIAGNOSIS — N1832 Chronic kidney disease, stage 3b: Secondary | ICD-10-CM | POA: Diagnosis not present

## 2020-09-05 DIAGNOSIS — D509 Iron deficiency anemia, unspecified: Secondary | ICD-10-CM | POA: Diagnosis not present

## 2020-09-05 DIAGNOSIS — M1030 Gout due to renal impairment, unspecified site: Secondary | ICD-10-CM | POA: Diagnosis not present

## 2020-09-05 DIAGNOSIS — D649 Anemia, unspecified: Secondary | ICD-10-CM | POA: Diagnosis not present

## 2020-09-05 DIAGNOSIS — Z8673 Personal history of transient ischemic attack (TIA), and cerebral infarction without residual deficits: Secondary | ICD-10-CM | POA: Diagnosis not present

## 2020-09-05 DIAGNOSIS — E782 Mixed hyperlipidemia: Secondary | ICD-10-CM | POA: Diagnosis not present

## 2020-09-05 DIAGNOSIS — H409 Unspecified glaucoma: Secondary | ICD-10-CM | POA: Diagnosis not present

## 2020-09-05 NOTE — Progress Notes (Signed)
Carelink Summary Report / Loop Recorder 

## 2020-09-10 ENCOUNTER — Ambulatory Visit: Payer: Medicare Other | Attending: Internal Medicine

## 2020-09-10 DIAGNOSIS — Z23 Encounter for immunization: Secondary | ICD-10-CM

## 2020-09-10 NOTE — Progress Notes (Signed)
   Covid-19 Vaccination Clinic  Name:  Trevor Murphy    MRN: [id number] DOB: Feb 14, 1935  09/10/2020  Trevor Murphy was observed post Covid-19 immunization for 15 minutes without incident. He was provided with Vaccine Information Sheet and instruction to access the V-Safe system.   Trevor Murphy was instructed to call 911 with any severe reactions post vaccine: [name] Kitchen Difficulty breathing  . Swelling of face and throat  . A fast heartbeat  . A bad rash all over body  . Dizziness and weakness   Immunizations Administered    Name Date Dose VIS Date Route   PFIZER Comrnaty(Gray TOP) Covid-19 Vaccine 09/10/2020  3:40 PM 0.3 mL 07/18/2020 Intramuscular   Manufacturer: [employer], [employer]   Lot: [id number]   NDC: [phone]

## 2020-09-11 DIAGNOSIS — J9811 Atelectasis: Secondary | ICD-10-CM | POA: Diagnosis not present

## 2020-09-11 DIAGNOSIS — Z9181 History of falling: Secondary | ICD-10-CM | POA: Diagnosis not present

## 2020-09-11 DIAGNOSIS — N1832 Chronic kidney disease, stage 3b: Secondary | ICD-10-CM | POA: Diagnosis not present

## 2020-09-11 DIAGNOSIS — D509 Iron deficiency anemia, unspecified: Secondary | ICD-10-CM | POA: Diagnosis not present

## 2020-09-11 DIAGNOSIS — Z8673 Personal history of transient ischemic attack (TIA), and cerebral infarction without residual deficits: Secondary | ICD-10-CM | POA: Diagnosis not present

## 2020-09-11 DIAGNOSIS — I480 Paroxysmal atrial fibrillation: Secondary | ICD-10-CM | POA: Diagnosis not present

## 2020-09-11 DIAGNOSIS — D62 Acute posthemorrhagic anemia: Secondary | ICD-10-CM | POA: Diagnosis not present

## 2020-09-11 DIAGNOSIS — I5032 Chronic diastolic (congestive) heart failure: Secondary | ICD-10-CM | POA: Diagnosis not present

## 2020-09-11 DIAGNOSIS — E782 Mixed hyperlipidemia: Secondary | ICD-10-CM | POA: Diagnosis not present

## 2020-09-11 DIAGNOSIS — H409 Unspecified glaucoma: Secondary | ICD-10-CM | POA: Diagnosis not present

## 2020-09-11 DIAGNOSIS — D649 Anemia, unspecified: Secondary | ICD-10-CM | POA: Diagnosis not present

## 2020-09-11 DIAGNOSIS — D631 Anemia in chronic kidney disease: Secondary | ICD-10-CM | POA: Diagnosis not present

## 2020-09-11 DIAGNOSIS — Z7901 Long term (current) use of anticoagulants: Secondary | ICD-10-CM | POA: Diagnosis not present

## 2020-09-11 DIAGNOSIS — M1030 Gout due to renal impairment, unspecified site: Secondary | ICD-10-CM | POA: Diagnosis not present

## 2020-09-11 DIAGNOSIS — M170 Bilateral primary osteoarthritis of knee: Secondary | ICD-10-CM | POA: Diagnosis not present

## 2020-09-11 DIAGNOSIS — K269 Duodenal ulcer, unspecified as acute or chronic, without hemorrhage or perforation: Secondary | ICD-10-CM | POA: Diagnosis not present

## 2020-09-11 DIAGNOSIS — I130 Hypertensive heart and chronic kidney disease with heart failure and stage 1 through stage 4 chronic kidney disease, or unspecified chronic kidney disease: Secondary | ICD-10-CM | POA: Diagnosis not present

## 2020-09-11 DIAGNOSIS — I2510 Atherosclerotic heart disease of native coronary artery without angina pectoris: Secondary | ICD-10-CM | POA: Diagnosis not present

## 2020-09-12 ENCOUNTER — Ambulatory Visit (INDEPENDENT_AMBULATORY_CARE_PROVIDER_SITE_OTHER): Payer: Medicare Other

## 2020-09-12 ENCOUNTER — Other Ambulatory Visit: Payer: Self-pay

## 2020-09-12 ENCOUNTER — Telehealth: Payer: Self-pay

## 2020-09-12 VITALS — Ht 64.0 in | Wt 130.0 lb

## 2020-09-12 DIAGNOSIS — Z0000 Encounter for general adult medical examination without abnormal findings: Secondary | ICD-10-CM

## 2020-09-12 NOTE — Telephone Encounter (Signed)
Patient consented to telehealth visit. 

## 2020-09-12 NOTE — Progress Notes (Signed)
I connected with Baird Cancer today by telephone and verified that I am speaking with the correct person using two identifiers. Location patient: home Location provider: work Persons participating in the virtual visit: Baird Cancer, Trevor Murphy (sister), Trevor Durand LPN.   I discussed the limitations, risks, security and privacy concerns of performing an evaluation and management service by telephone and the availability of in person appointments. I also discussed with the patient that there may be a patient responsible charge related to this service. The patient expressed understanding and verbally consented to this telephonic visit.    Interactive audio and video telecommunications were attempted between this provider and patient, however failed, due to patient having technical difficulties OR patient did not have access to video capability.  We continued and completed visit with audio only.     Vital signs may be patient reported or missing.  Subjective:   Trevor Murphy is a 86 y.o. male who presents for Medicare Annual/Subsequent preventive examination.  Review of Systems     Cardiac Risk Factors include: advanced age (>21men, >32 women);dyslipidemia;hypertension;male gender;sedentary lifestyle     Objective:    Today's Vitals   09/12/20 1131  Weight: 130 lb (59 kg)  Height: 5\' 4"  (1.626 m)   Body mass index is 22.31 kg/m.  Advanced Directives 09/12/2020 08/12/2020 08/08/2019 05/24/2019 07/22/2018 05/03/2016 04/14/2016  Does Patient Have a Medical Advance Directive? No Yes Yes No No Yes No  Type of Advance Directive - Living will [place] - - - -  Does patient want to make changes to medical advance directive? - No - Patient declined - - - - -  Copy of [place] in Chart? - - No - copy requested - - - -  Would patient like information on creating a medical advance directive? - - - - No - Patient declined - No - patient declined information   Pre-existing out of facility DNR order (yellow form or pink MOST form) - - - - - - -    Current Medications (verified) Outpatient Encounter Medications as of 09/12/2020  Medication Sig  . apixaban (ELIQUIS) 2.5 MG TABS tablet Take 1 tablet (2.5 mg total) by mouth 2 (two) times daily.  [name] Kitchen atorvastatin (LIPITOR) 80 MG tablet Take 1 tablet (80 mg total) by mouth daily at 6 PM.  . ferrous sulfate 325 (65 FE) MG tablet Take 1 tablet (325 mg total) by mouth daily.  [name] Kitchen LUMIGAN 0.01 % SOLN Place 1 drop into both eyes at bedtime.  . pantoprazole (PROTONIX) 40 MG tablet Take 1 tablet (40 mg total) by mouth 2 (two) times daily.   No facility-administered encounter medications on file as of 09/12/2020.    Allergies (verified) Patient has no known allergies.   History: Past Medical History:  Diagnosis Date  . Arthritis   . CAD (coronary artery disease)    a. Cath 08/2009: minimal nonobstructive disease (25% LAD). b. Lexiscan nuc 6/215: EF 54%, no ischemia or infarction.  . Chronic diastolic CHF (congestive heart failure) ([place])    a. Echo 2013: EF 55-60%, mild AI, mild MR. b. Echo 5/15 with EF 60-65%, no WMA, mild LVH, mild AI.  [name] Kitchen CKD (chronic kidney disease) stage 3, GFR 30-59 ml/min (HCC) 07/26/2012   a. Felt likely due to HTN.  . CVA (cerebral infarction) 07/28/2012  . Glaucoma   . Gout   . H/O umbilical hernia repair   . Headache(784.0)    " WHEN MY BLOOD  PRESSURE IS UP"  . History of acute renal failure   . History of alcohol abuse   . HTN (hypertension)   . Hypertension    a. Patent renal arteries 2005.  [name] Kitchen Noncompliance   . OA (osteoarthritis)   . Other and unspecified hyperlipidemia   . Poor circulation   . Sinus bradycardia   . SVT (supraventricular tachycardia) ([place])    a. Possible AVNRT.  Got adenosine from EMS in 2015 for SVT.  During 5/15 hospitalization had episode of short R-P tachycardia (possible AVNRT) terminated by adenosine.  b. 04/2016 - recurrent tachycardia (SVT vs.  AVNRT), again terminated by Adenosine  . Visual impairment    Past Surgical History:  Procedure Laterality Date  . APPENDECTOMY    . BIOPSY  08/12/2020   Procedure: BIOPSY;  Surgeon: Juanita Craver, MD;  Location: [facility] ENDOSCOPY;  Service: Endoscopy;;  . BUBBLE STUDY  05/24/2019   Procedure: BUBBLE STUDY;  Surgeon: Donato Heinz, MD;  Location: [place];  Service: Endoscopy;;  . CARDIAC CATHETERIZATION  11/2003   EF 50-55%  . CARDIAC CATHETERIZATION  08/2009  . CATARACT EXTRACTION EXTRACAPSULAR Right 05/17/2013   Procedure: CATARACT EXTRACTION EXTRACAPSULAR WITH INTRAOCULAR LENS PLACEMENT (IOC) RIGHT EYE;  Surgeon: Marylynn Pearson, MD;  Location: [place];  Service: Ophthalmology;  Laterality: Right;  . COLONOSCOPY    . ESOPHAGOGASTRODUODENOSCOPY (EGD) WITH PROPOFOL N/A 08/12/2020   Procedure: ESOPHAGOGASTRODUODENOSCOPY (EGD) WITH PROPOFOL;  Surgeon: Juanita Craver, MD;  Location: [facility] ENDOSCOPY;  Service: Endoscopy;  Laterality: N/A;  . HERNIA REPAIR    . LOOP RECORDER INSERTION N/A 05/24/2019   Procedure: LOOP RECORDER INSERTION;  Surgeon: Constance Haw, MD;  Location: [place] CV LAB;  Service: Cardiovascular;  Laterality: N/A;  . TEE WITHOUT CARDIOVERSION N/A 05/24/2019   Procedure: TRANSESOPHAGEAL ECHOCARDIOGRAM (TEE);  Surgeon: Donato Heinz, MD;  Location: [facility] ENDOSCOPY;  Service: Endoscopy;  Laterality: N/A;  . VENTRAL HERNIA REPAIR  03/07/2012   Procedure: LAPAROSCOPIC VENTRAL HERNIA;  Surgeon: Gwenyth Ober, MD;  Location: [place];  Service: General;  Laterality: N/A;   Family History  Problem Relation Age of Onset  . CVA Mother   . Hypertension Brother   . Hypertension Sister   . Hypertension Brother   . Stroke Sister   . Heart attack Neg Hx    Social History   Socioeconomic History  . Marital status: Married    Spouse name: Not on file  . Number of children: Not on file  . Years of education: Not on file  . Highest education level: Not on file   Occupational History  . Occupation: retired  Tobacco Use  . Smoking status: Never Smoker  . Smokeless tobacco: Never Used  Vaping Use  . Vaping Use: Never used  Substance and Sexual Activity  . Alcohol use: No    Alcohol/week: 1.0 standard drink    Types: 1 Cans of beer per week    Comment: h/o heavy use, quit in 2017  . Drug use: No  . Sexual activity: Not Currently  Other Topics Concern  . Not on file  Social History Narrative  . Not on file   Social Determinants of Health   Financial Resource Strain: Low Risk   . Difficulty of Paying Living Expenses: Not hard at all  Food Insecurity: No Food Insecurity  . Worried About Charity fundraiser in the Last Year: Never true  . Ran Out of Food in the Last Year: Never true  Transportation Needs:  No Transportation Needs  . Lack of Transportation (Medical): No  . Lack of Transportation (Non-Medical): No  Physical Activity: Inactive  . Days of Exercise per Week: 0 days  . Minutes of Exercise per Session: 0 min  Stress: No Stress Concern Present  . Feeling of Stress : Not at all  Social Connections: Not on file    Tobacco Counseling Counseling given: Not Answered   Clinical Intake:  Pre-visit preparation completed: Yes  Pain : No/denies pain     Nutritional Status: BMI of 19-24  Normal Nutritional Risks: None Diabetes: No  How often do you need to have someone help you when you read instructions, pamphlets, or other written materials from your doctor or pharmacy?: 1 - Never What is the last grade level you completed in school?: 11th grade  Diabetic? no  Interpreter Needed?: No  Information entered by :: NAllen LPN   Activities of Daily Living In your present state of health, do you have any difficulty performing the following activities: 09/12/2020 08/12/2020  Hearing? N N  Vision? Y Y  Difficulty concentrating or making decisions? Y N  Walking or climbing stairs? Y Y  Dressing or bathing? N Y  Doing errands,  shopping? Tempie Donning  Comment sister takes -  Conservation officer, nature and eating ? Y -  Using the Toilet? N -  In the past six months, have you accidently leaked urine? Y -  Do you have problems with loss of bowel control? N -  Managing your Medications? Y -  Comment sister manages -  Managing your Finances? Y -  Housekeeping or managing your Housekeeping? Y -  Some recent data might be hidden    Patient Care Team: [name], FNP as PCP - General (General Practice)  Indicate any recent Medical Services you may have received from other than [facility] providers in the past year (date may be approximate).     Assessment:   This is a routine wellness examination for Charvez.  Hearing/Vision screen  Hearing Screening   125Hz  250Hz  500Hz  1000Hz  2000Hz  3000Hz  4000Hz  6000Hz  8000Hz   Right ear:           Left ear:           Vision Screening Comments: Regular eye exams, Dr. Venetia Maxon  Dietary issues and exercise activities discussed: Current Exercise Habits: The patient does not participate in regular exercise at present  Goals    . Patient Stated     08/08/2019, no goals     . Patient Stated     09/12/2020, no goals      Depression Screen PHQ 2/9 Scores 09/12/2020 11/07/2019 08/08/2019 07/05/2019 11/21/2018 07/22/2018  PHQ - 2 Score 0 0 0 0 0 0  PHQ- 9 Score - - 0 - - 0    Fall Risk Fall Risk  09/12/2020 11/07/2019 11/07/2019 08/08/2019 11/21/2018  Falls in the past year? 0 0 0 0 0  Number falls in past yr: - - - - -  Comment - - - - -  Injury with Fall? - - - - -  Comment - - - - -  Risk for fall due to : Impaired mobility;Impaired balance/gait;Medication side effect - - Impaired balance/gait;Impaired mobility -  Follow up Falls evaluation completed;Education provided;Falls prevention discussed - - Education provided;Falls prevention discussed -    FALL RISK PREVENTION PERTAINING TO THE HOME:  Any stairs in or around the home? No  If so, are there any without handrails? n/a Home free of  loose  throw rugs in walkways, pet beds, electrical cords, etc? Yes  Adequate lighting in your home to reduce risk of falls? Yes   ASSISTIVE DEVICES UTILIZED TO PREVENT FALLS:  Life alert? No  Use of a cane, walker or w/c? Yes  Grab bars in the bathroom? Yes  Shower chair or bench in shower? Yes  Elevated toilet seat or a handicapped toilet? Yes   TIMED UP AND GO:  Was the test performed? No . .    Cognitive Function:     6CIT Screen 09/12/2020 08/08/2019 07/22/2018  What Year? 4 points 0 points 4 points  What month? 0 points 0 points 0 points  What time? 3 points 3 points 0 points  Count back from 20 0 points 0 points 0 points  Months in reverse 4 points 4 points 2 points  Repeat phrase 10 points 10 points 0 points  Total Score 21 17 6     Immunizations Immunization History  Administered Date(s) Administered  . PFIZER Comirnaty(Gray Top)Covid-19 Tri-Sucrose Vaccine 09/10/2020  . PFIZER(Purple Top)SARS-COV-2 Vaccination 08/20/2020    TDAP status: Up to date  Flu Vaccine status: Declined, Education has been provided regarding the importance of this vaccine but patient still declined. Advised may receive this vaccine at local pharmacy or Health Dept. Aware to provide a copy of the vaccination record if obtained from local pharmacy or Health Dept. Verbalized acceptance and understanding.  Pneumococcal vaccine status: Declined,  Education has been provided regarding the importance of this vaccine but patient still declined. Advised may receive this vaccine at local pharmacy or Health Dept. Aware to provide a copy of the vaccination record if obtained from local pharmacy or Health Dept. Verbalized acceptance and understanding.   Covid-19 vaccine status: Completed vaccines  Qualifies for Shingles Vaccine? Yes   Zostavax completed No   Shingrix Completed?: No.    Education has been provided regarding the importance of this vaccine. Patient has been advised to call insurance company to  determine out of pocket expense if they have not yet received this vaccine. Advised may also receive vaccine at local pharmacy or Health Dept. Verbalized acceptance and understanding.  Screening Tests Health Maintenance  Topic Date Due  . PNA vac Low Risk Adult (1 of 2 - PCV13) Never done  . OPHTHALMOLOGY EXAM  04/29/2019  . FOOT EXAM  08/07/2020  . INFLUENZA VACCINE  11/07/2020 (Originally 03/10/2020)  . COVID-19 Vaccine (3 - Pfizer risk 4-dose series) 10/08/2020  . HEMOGLOBIN A1C  02/09/2021  . URINE MICROALBUMIN  08/08/2021  . TETANUS/TDAP  06/22/2023    Health Maintenance  Health Maintenance Due  Topic Date Due  . PNA vac Low Risk Adult (1 of 2 - PCV13) Never done  . OPHTHALMOLOGY EXAM  04/29/2019  . FOOT EXAM  08/07/2020    Colorectal cancer screening: No longer required.   Lung Cancer Screening: (Low Dose CT Chest recommended if Age 47-80 years, 30 pack-year currently smoking OR have quit w/in 15years.) does not qualify.   Lung Cancer Screening Referral: no  Additional Screening:  Hepatitis C Screening: does not qualify;   Vision Screening: Recommended annual ophthalmology exams for early detection of glaucoma and other disorders of the eye. Is the patient up to date with their annual eye exam?  No  Who is the provider or what is the name of the office in which the patient attends annual eye exams? Dr. Venetia Maxon If pt is not established with a provider, would they like to be referred  to a provider to establish care? No .   Dental Screening: Recommended annual dental exams for proper oral hygiene  Community Resource Referral / Chronic Care Management: CRR required this visit?  No   CCM required this visit?  No      Plan:     I have personally reviewed and noted the following in the patient's chart:   . Medical and social history . Use of alcohol, tobacco or illicit drugs  . Current medications and supplements . Functional ability and status . Nutritional  status . Physical activity . Advanced directives . List of other physicians . Hospitalizations, surgeries, and ER visits in previous 12 months . Vitals . Screenings to include cognitive, depression, and falls . Referrals and appointments  In addition, I have reviewed and discussed with patient certain preventive protocols, quality metrics, and best practice recommendations. A written personalized care plan for preventive services as well as general preventive health recommendations were provided to patient.     Kellie Simmering, LPN   10/10/2949   Nurse Notes:

## 2020-09-12 NOTE — Patient Instructions (Signed)
[name] , Thank you for taking time to come for your Medicare Wellness Visit. I appreciate your ongoing commitment to your health goals. Please review the following plan we discussed and let me know if I can assist you in the future.   Screening recommendations/referrals: Colonoscopy: not required Recommended yearly ophthalmology/optometry visit for glaucoma screening and checkup Recommended yearly dental visit for hygiene and checkup  Vaccinations: Influenza vaccine: decline Pneumococcal vaccine: decline Tdap vaccine: completed 06/21/2013, due 06/22/2023 Shingles vaccine: discussed   Covid-19:  09/10/2020, 08/20/2020  Advanced directives: Advance directive discussed with you today.   Conditions/risks identified: none  Next appointment: Follow up in one year for your annual wellness visit.   Preventive Care 30 Years and Older, Male Preventive care refers to lifestyle choices and visits with your health care provider that can promote health and wellness. What does preventive care include?  A yearly physical exam. This is also called an annual well check.  Dental exams once or twice a year.  Routine eye exams. Ask your health care provider how often you should have your eyes checked.  Personal lifestyle choices, including:  Daily care of your teeth and gums.  Regular physical activity.  Eating a healthy diet.  Avoiding tobacco and drug use.  Limiting alcohol use.  Practicing safe sex.  Taking low doses of aspirin every day.  Taking vitamin and mineral supplements as recommended by your health care provider. What happens during an annual well check? The services and screenings done by your health care provider during your annual well check will depend on your age, overall health, lifestyle risk factors, and family history of disease. Counseling  Your health care provider may ask you questions about your:  Alcohol use.  Tobacco use.  Drug use.  Emotional  well-being.  Home and relationship well-being.  Sexual activity.  Eating habits.  History of falls.  Memory and ability to understand (cognition).  Work and work Statistician. Screening  You may have the following tests or measurements:  Height, weight, and BMI.  Blood pressure.  Lipid and cholesterol levels. These may be checked every 5 years, or more frequently if you are over 56 years old.  Skin check.  Lung cancer screening. You may have this screening every year starting at age 52 if you have a 30-pack-year history of smoking and currently smoke or have quit within the past 15 years.  Fecal occult blood test (FOBT) of the stool. You may have this test every year starting at age 71.  Flexible sigmoidoscopy or colonoscopy. You may have a sigmoidoscopy every 5 years or a colonoscopy every 10 years starting at age 10.  Prostate cancer screening. Recommendations will vary depending on your family history and other risks.  Hepatitis C blood test.  Hepatitis B blood test.  Sexually transmitted disease (STD) testing.  Diabetes screening. This is done by checking your blood sugar (glucose) after you have not eaten for a while (fasting). You may have this done every 1-3 years.  Abdominal aortic aneurysm (AAA) screening. You may need this if you are a current or former smoker.  Osteoporosis. You may be screened starting at age 65 if you are at high risk. Talk with your health care provider about your test results, treatment options, and if necessary, the need for more tests. Vaccines  Your health care provider may recommend certain vaccines, such as:  Influenza vaccine. This is recommended every year.  Tetanus, diphtheria, and acellular pertussis (Tdap, Td) vaccine. You may need  a Td booster every 10 years.  Zoster vaccine. You may need this after age 53.  Pneumococcal 13-valent conjugate (PCV13) vaccine. One dose is recommended after age 68.  Pneumococcal  polysaccharide (PPSV23) vaccine. One dose is recommended after age 20. Talk to your health care provider about which screenings and vaccines you need and how often you need them. This information is not intended to replace advice given to you by your health care provider. Make sure you discuss any questions you have with your health care provider. Document Released: 08/23/2015 Document Revised: 04/15/2016 Document Reviewed: 05/28/2015 Elsevier Interactive Patient Education  2017 [employer] Prevention in the Home Falls can cause injuries. They can happen to people of all ages. There are many things you can do to make your home safe and to help prevent falls. What can I do on the outside of my home?  Regularly fix the edges of walkways and driveways and fix any cracks.  Remove anything that might make you trip as you walk through a door, such as a raised step or threshold.  Trim any bushes or trees on the path to your home.  Use bright outdoor lighting.  Clear any walking paths of anything that might make someone trip, such as rocks or tools.  Regularly check to see if handrails are loose or broken. Make sure that both sides of any steps have handrails.  Any raised decks and porches should have guardrails on the edges.  Have any leaves, snow, or ice cleared regularly.  Use sand or salt on walking paths during winter.  Clean up any spills in your garage right away. This includes oil or grease spills. What can I do in the bathroom?  Use night lights.  Install grab bars by the toilet and in the tub and shower. Do not use towel bars as grab bars.  Use non-skid mats or decals in the tub or shower.  If you need to sit down in the shower, use a plastic, non-slip stool.  Keep the floor dry. Clean up any water that spills on the floor as soon as it happens.  Remove soap buildup in the tub or shower regularly.  Attach bath mats securely with double-sided non-slip rug  tape.  Do not have throw rugs and other things on the floor that can make you trip. What can I do in the bedroom?  Use night lights.  Make sure that you have a light by your bed that is easy to reach.  Do not use any sheets or blankets that are too big for your bed. They should not hang down onto the floor.  Have a firm chair that has side arms. You can use this for support while you get dressed.  Do not have throw rugs and other things on the floor that can make you trip. What can I do in the kitchen?  Clean up any spills right away.  Avoid walking on wet floors.  Keep items that you use a lot in easy-to-reach places.  If you need to reach something above you, use a strong step stool that has a grab bar.  Keep electrical cords out of the way.  Do not use floor polish or wax that makes floors slippery. If you must use wax, use non-skid floor wax.  Do not have throw rugs and other things on the floor that can make you trip. What can I do with my stairs?  Do not leave any items on the  stairs.  Make sure that there are handrails on both sides of the stairs and use them. Fix handrails that are broken or loose. Make sure that handrails are as long as the stairways.  Check any carpeting to make sure that it is firmly attached to the stairs. Fix any carpet that is loose or worn.  Avoid having throw rugs at the top or bottom of the stairs. If you do have throw rugs, attach them to the floor with carpet tape.  Make sure that you have a light switch at the top of the stairs and the bottom of the stairs. If you do not have them, ask someone to add them for you. What else can I do to help prevent falls?  Wear shoes that:  Do not have high heels.  Have rubber bottoms.  Are comfortable and fit you well.  Are closed at the toe. Do not wear sandals.  If you use a stepladder:  Make sure that it is fully opened. Do not climb a closed stepladder.  Make sure that both sides of the  stepladder are locked into place.  Ask someone to hold it for you, if possible.  Clearly mark and make sure that you can see:  Any grab bars or handrails.  First and last steps.  Where the edge of each step is.  Use tools that help you move around (mobility aids) if they are needed. These include:  Canes.  Walkers.  Scooters.  Crutches.  Turn on the lights when you go into a dark area. Replace any light bulbs as soon as they burn out.  Set up your furniture so you have a clear path. Avoid moving your furniture around.  If any of your floors are uneven, fix them.  If there are any pets around you, be aware of where they are.  Review your medicines with your doctor. Some medicines can make you feel dizzy. This can increase your chance of falling. Ask your doctor what other things that you can do to help prevent falls. This information is not intended to replace advice given to you by your health care provider. Make sure you discuss any questions you have with your health care provider. Document Released: 05/23/2009 Document Revised: 01/02/2016 Document Reviewed: 08/31/2014 Elsevier Interactive Patient Education  2017 [employer].

## 2020-09-13 DIAGNOSIS — J9811 Atelectasis: Secondary | ICD-10-CM | POA: Diagnosis not present

## 2020-09-13 DIAGNOSIS — D509 Iron deficiency anemia, unspecified: Secondary | ICD-10-CM | POA: Diagnosis not present

## 2020-09-13 DIAGNOSIS — E782 Mixed hyperlipidemia: Secondary | ICD-10-CM | POA: Diagnosis not present

## 2020-09-13 DIAGNOSIS — M1030 Gout due to renal impairment, unspecified site: Secondary | ICD-10-CM | POA: Diagnosis not present

## 2020-09-13 DIAGNOSIS — D649 Anemia, unspecified: Secondary | ICD-10-CM | POA: Diagnosis not present

## 2020-09-13 DIAGNOSIS — M170 Bilateral primary osteoarthritis of knee: Secondary | ICD-10-CM | POA: Diagnosis not present

## 2020-09-13 DIAGNOSIS — Z7901 Long term (current) use of anticoagulants: Secondary | ICD-10-CM | POA: Diagnosis not present

## 2020-09-13 DIAGNOSIS — K269 Duodenal ulcer, unspecified as acute or chronic, without hemorrhage or perforation: Secondary | ICD-10-CM | POA: Diagnosis not present

## 2020-09-13 DIAGNOSIS — D631 Anemia in chronic kidney disease: Secondary | ICD-10-CM | POA: Diagnosis not present

## 2020-09-13 DIAGNOSIS — H409 Unspecified glaucoma: Secondary | ICD-10-CM | POA: Diagnosis not present

## 2020-09-13 DIAGNOSIS — Z8673 Personal history of transient ischemic attack (TIA), and cerebral infarction without residual deficits: Secondary | ICD-10-CM | POA: Diagnosis not present

## 2020-09-13 DIAGNOSIS — N1832 Chronic kidney disease, stage 3b: Secondary | ICD-10-CM | POA: Diagnosis not present

## 2020-09-13 DIAGNOSIS — I5032 Chronic diastolic (congestive) heart failure: Secondary | ICD-10-CM | POA: Diagnosis not present

## 2020-09-13 DIAGNOSIS — I130 Hypertensive heart and chronic kidney disease with heart failure and stage 1 through stage 4 chronic kidney disease, or unspecified chronic kidney disease: Secondary | ICD-10-CM | POA: Diagnosis not present

## 2020-09-13 DIAGNOSIS — Z9181 History of falling: Secondary | ICD-10-CM | POA: Diagnosis not present

## 2020-09-13 DIAGNOSIS — I480 Paroxysmal atrial fibrillation: Secondary | ICD-10-CM | POA: Diagnosis not present

## 2020-09-13 DIAGNOSIS — D62 Acute posthemorrhagic anemia: Secondary | ICD-10-CM | POA: Diagnosis not present

## 2020-09-13 DIAGNOSIS — I2510 Atherosclerotic heart disease of native coronary artery without angina pectoris: Secondary | ICD-10-CM | POA: Diagnosis not present

## 2020-09-15 ENCOUNTER — Other Ambulatory Visit: Payer: Self-pay

## 2020-09-15 ENCOUNTER — Emergency Department (HOSPITAL_COMMUNITY): Payer: Medicare Other

## 2020-09-15 ENCOUNTER — Emergency Department (HOSPITAL_COMMUNITY)
Admission: EM | Admit: 2020-09-15 | Discharge: 2020-09-15 | Disposition: A | Discharge status: Home / Self Care | Payer: Medicare Other | Attending: Emergency Medicine | Admitting: Emergency Medicine

## 2020-09-15 ENCOUNTER — Encounter (HOSPITAL_COMMUNITY): Payer: Self-pay | Admitting: Emergency Medicine

## 2020-09-15 DIAGNOSIS — I5032 Chronic diastolic (congestive) heart failure: Secondary | ICD-10-CM | POA: Diagnosis not present

## 2020-09-15 DIAGNOSIS — R002 Palpitations: Secondary | ICD-10-CM | POA: Insufficient documentation

## 2020-09-15 DIAGNOSIS — I130 Hypertensive heart and chronic kidney disease with heart failure and stage 1 through stage 4 chronic kidney disease, or unspecified chronic kidney disease: Secondary | ICD-10-CM | POA: Insufficient documentation

## 2020-09-15 DIAGNOSIS — R079 Chest pain, unspecified: Secondary | ICD-10-CM | POA: Diagnosis not present

## 2020-09-15 DIAGNOSIS — R001 Bradycardia, unspecified: Secondary | ICD-10-CM

## 2020-09-15 DIAGNOSIS — Z7901 Long term (current) use of anticoagulants: Secondary | ICD-10-CM | POA: Insufficient documentation

## 2020-09-15 DIAGNOSIS — N1832 Chronic kidney disease, stage 3b: Secondary | ICD-10-CM | POA: Diagnosis not present

## 2020-09-15 DIAGNOSIS — I2510 Atherosclerotic heart disease of native coronary artery without angina pectoris: Secondary | ICD-10-CM | POA: Insufficient documentation

## 2020-09-15 DIAGNOSIS — R0789 Other chest pain: Secondary | ICD-10-CM | POA: Diagnosis not present

## 2020-09-15 LAB — CBC
HCT: 35 % — ABNORMAL LOW (ref 39.0–52.0)
Hemoglobin: 10.4 g/dL — ABNORMAL LOW (ref 13.0–17.0)
MCH: 24.2 pg — ABNORMAL LOW (ref 26.0–34.0)
MCHC: 29.7 g/dL — ABNORMAL LOW (ref 30.0–36.0)
MCV: 81.6 fL (ref 80.0–100.0)
Platelets: 132 10*3/uL — ABNORMAL LOW (ref 150–400)
RBC: 4.29 MIL/uL (ref 4.22–5.81)
RDW: 23.5 % — ABNORMAL HIGH (ref 11.5–15.5)
WBC: 4.3 10*3/uL (ref 4.0–10.5)
nRBC: 0 % (ref 0.0–0.2)

## 2020-09-15 LAB — BASIC METABOLIC PANEL
Anion gap: 9 (ref 5–15)
BUN: 19 mg/dL (ref 8–23)
CO2: 20 mmol/L — ABNORMAL LOW (ref 22–32)
Calcium: 7.5 mg/dL — ABNORMAL LOW (ref 8.9–10.3)
Chloride: 111 mmol/L (ref 98–111)
Creatinine, Ser: 1.88 mg/dL — ABNORMAL HIGH (ref 0.61–1.24)
GFR, Estimated: 35 mL/min — ABNORMAL LOW (ref >60)
Glucose, Bld: 112 mg/dL — ABNORMAL HIGH (ref 70–99)
Potassium: 4 mmol/L (ref 3.5–5.1)
Sodium: 140 mmol/L (ref 135–145)

## 2020-09-15 LAB — TROPONIN I (HIGH SENSITIVITY)
Troponin I (High Sensitivity): 15 ng/L (ref <18)
Troponin I (High Sensitivity): 18 ng/L — ABNORMAL HIGH (ref <18)

## 2020-09-15 NOTE — ED Provider Notes (Signed)
  Physical Exam  BP (!) 148/57 (BP Location: Right Arm)   Pulse 84   Temp 98.4 F (36.9 C) (Oral)   Resp 20   SpO2 100%   Physical Exam  ED Course/Procedures     Procedures  MDM  Care assumed at 3 pm. Patient had recent stroke and had loop recorder placed. Had chest pain and palpitations. Sign out pending interrogation of loop recorder and repeat trop and possible cardiology consult   7:19 PM Loop recorder showed no events. Briefly bradycardic to 40s but mostly in the 50s-60s. Patient not on beta blocker or CCB. Repeat trop stable. Will dc home with cardiology follow up      Drenda Freeze, MD 09/15/20 Lurena Nida

## 2020-09-15 NOTE — ED Triage Notes (Signed)
Pt reports pain to center of chest and SOB x 1 hour.  Denies nausea and vomiting.

## 2020-09-15 NOTE — ED Provider Notes (Signed)
[facility] EMERGENCY DEPARTMENT Provider Note   CSN: [id number] Arrival date & time: 09/15/20  1234     History Chief Complaint  Patient presents with  . Chest Pain    TYGE SOMERS is a 86 y.o. male. past medical history of coronary artery disease (nonobstructive cath 2011), chronic kidney disease stage IIIb, gout, iron deficiency anemia, diastolic congestive heart failure (Echo 05/2019 EF 60-65%), recent cardioembolic stroke secondary to paroxysmal atrial fibrillation (05/2020).   Presents for chest pain, palpitations, sob.  Reports that early this morning when he got up to go to the bathroom he had an episode of palpitations, described as heart racing sensation.  Had some associated chest discomfort.  Also felt somewhat short of breath.  Episode lasted approximately 30 minutes and then subsided.  States he does not have any ongoing pain, palpitations or difficulty breathing.  Denies any episodes of passing out, did feel slightly lightheaded at the time of the episode.   HPI     Past Medical History:  Diagnosis Date  . Arthritis   . CAD (coronary artery disease)    a. Cath 08/2009: minimal nonobstructive disease (25% LAD). b. Lexiscan nuc 6/215: EF 54%, no ischemia or infarction.  . Chronic diastolic CHF (congestive heart failure) ([place])    a. Echo 2013: EF 55-60%, mild AI, mild MR. b. Echo 5/15 with EF 60-65%, no WMA, mild LVH, mild AI.  [name] Kitchen CKD (chronic kidney disease) stage 3, GFR 30-59 ml/min (HCC) 07/26/2012   a. Felt likely due to HTN.  . CVA (cerebral infarction) 07/28/2012  . Glaucoma   . Gout   . H/O umbilical hernia repair   . Headache(784.0)    " WHEN MY BLOOD PRESSURE IS UP"  . History of acute renal failure   . History of alcohol abuse   . HTN (hypertension)   . Hypertension    a. Patent renal arteries 2005.  [name] Kitchen Noncompliance   . OA (osteoarthritis)   . Other and unspecified hyperlipidemia   . Poor circulation   . Sinus bradycardia   . SVT  (supraventricular tachycardia) ([place])    a. Possible AVNRT.  Got adenosine from EMS in 2015 for SVT.  During 5/15 hospitalization had episode of short R-P tachycardia (possible AVNRT) terminated by adenosine.  b. 04/2016 - recurrent tachycardia (SVT vs. AVNRT), again terminated by Adenosine  . Visual impairment     Patient Active Problem List   Diagnosis Date Noted  . Mixed hyperlipidemia 08/12/2020  . Acute blood loss anemia 08/11/2020  . Paroxysmal atrial fibrillation ([place]) 06/07/2020  . Secondary hypercoagulable state ([place]) 06/07/2020  . Ischemic cerebrovascular accident (CVA) of frontal lobe ([place]) 05/20/2019  . Pain due to onychomycosis of toenails of both feet 01/20/2019  . Prediabetes 07/22/2018  . Nail abnormality 07/22/2018  . Sinus bradycardia 05/02/2016  . Hypotension due to drugs 05/02/2016  . History of tachycardia 05/02/2016  . GERD (gastroesophageal reflux disease) 02/05/2015  . Coronary artery disease involving native coronary artery of native heart without angina pectoris 04/20/2014  . Atypical chest pain 04/12/2014  . Hypertensive urgency 03/11/2014  . Chronic diastolic CHF (congestive heart failure) ([place]) 01/30/2014  . Alcohol abuse 01/19/2014  . Elevated troponin level not due myocardial infarction 12/19/2013  . Gout 12/19/2013  . Dyslipidemia 12/16/2013  . Hypertensive crisis 12/16/2013  . Cerebral infarction ([place]) 07/28/2012  . Acute on chronic renal failure ([place]) 07/27/2012  . Essential hypertension 07/26/2012  . Chronic kidney disease, stage 3b ([place]) 07/26/2012  .  Poor circulation   . Recurrent ventral hernia 02/16/2012  . Hernia (acquired) (recurrent) 02/10/2011    Past Surgical History:  Procedure Laterality Date  . APPENDECTOMY    . BIOPSY  08/12/2020   Procedure: BIOPSY;  Surgeon: Juanita Craver, MD;  Location: [facility] ENDOSCOPY;  Service: Endoscopy;;  . BUBBLE STUDY  05/24/2019   Procedure: BUBBLE STUDY;  Surgeon: Donato Heinz, MD;  Location: [facility];  Service: Endoscopy;;  . CARDIAC CATHETERIZATION  11/2003   EF 50-55%  . CARDIAC CATHETERIZATION  08/2009  . CATARACT EXTRACTION EXTRACAPSULAR Right 05/17/2013   Procedure: CATARACT EXTRACTION EXTRACAPSULAR WITH INTRAOCULAR LENS PLACEMENT (IOC) RIGHT EYE;  Surgeon: Marylynn Pearson, MD;  Location: [facility];  Service: Ophthalmology;  Laterality: Right;  . COLONOSCOPY    . ESOPHAGOGASTRODUODENOSCOPY (EGD) WITH PROPOFOL N/A 08/12/2020   Procedure: ESOPHAGOGASTRODUODENOSCOPY (EGD) WITH PROPOFOL;  Surgeon: Juanita Craver, MD;  Location: [facility] ENDOSCOPY;  Service: Endoscopy;  Laterality: N/A;  . HERNIA REPAIR    . LOOP RECORDER INSERTION N/A 05/24/2019   Procedure: LOOP RECORDER INSERTION;  Surgeon: Constance Haw, MD;  Location: [facility] CV LAB;  Service: Cardiovascular;  Laterality: N/A;  . TEE WITHOUT CARDIOVERSION N/A 05/24/2019   Procedure: TRANSESOPHAGEAL ECHOCARDIOGRAM (TEE);  Surgeon: Donato Heinz, MD;  Location: [facility] ENDOSCOPY;  Service: Endoscopy;  Laterality: N/A;  . VENTRAL HERNIA REPAIR  03/07/2012   Procedure: LAPAROSCOPIC VENTRAL HERNIA;  Surgeon: Gwenyth Ober, MD;  Location: [facility];  Service: General;  Laterality: N/A;       Family History  Problem Relation Age of Onset  . CVA Mother   . Hypertension Brother   . Hypertension Sister   . Hypertension Brother   . Stroke Sister   . Heart attack Neg Hx     Social History   Tobacco Use  . Smoking status: Never Smoker  . Smokeless tobacco: Never Used  Vaping Use  . Vaping Use: Never used  Substance Use Topics  . Alcohol use: No    Alcohol/week: 1.0 standard drink    Types: 1 Cans of beer per week    Comment: h/o heavy use, quit in 2017  . Drug use: No    Home Medications Prior to Admission medications   Medication Sig Start Date End Date Taking? Authorizing Provider  apixaban (ELIQUIS) 2.5 MG TABS tablet Take 1 tablet (2.5 mg total) by mouth 2 (two) times daily. 07/19/20  Yes Fenton, Clint R, PA   atorvastatin (LIPITOR) 80 MG tablet Take 1 tablet (80 mg total) by mouth daily at 6 PM. 03/12/20  Yes [name], FNP  ferrous sulfate 325 (65 FE) MG tablet Take 1 tablet (325 mg total) by mouth daily. 03/12/20 03/12/21 Yes [name], FNP  LUMIGAN 0.01 % SOLN Place 1 drop into both eyes at bedtime. 08/28/19  Yes [provider]  pantoprazole (PROTONIX) 40 MG tablet Take 1 tablet (40 mg total) by mouth 2 (two) times daily. 08/15/20  Yes Black, Lezlie Octave, NP    Allergies    Patient has no known allergies.  Review of Systems   Review of Systems  Constitutional: Positive for fatigue. Negative for chills and fever.  HENT: Negative for ear pain and sore throat.   Eyes: Negative for pain and visual disturbance.  Respiratory: Negative for cough and shortness of breath.   Cardiovascular: Positive for chest pain and palpitations.  Gastrointestinal: Negative for abdominal pain and vomiting.  Genitourinary: Negative for dysuria and hematuria.  Musculoskeletal: Negative for arthralgias and  back pain.  Skin: Negative for color change and rash.  Neurological: Negative for seizures and syncope.  All other systems reviewed and are negative.   Physical Exam Updated Vital Signs BP (!) 145/77   Pulse 63   Temp 98.4 F (36.9 C) (Oral)   Resp 16   SpO2 100%   Physical Exam Vitals and nursing note reviewed.  Constitutional:      Appearance: He is well-developed and well-nourished.  HENT:     Head: Normocephalic and atraumatic.  Eyes:     Conjunctiva/sclera: Conjunctivae normal.  Cardiovascular:     Rate and Rhythm: Regular rhythm. Bradycardia present.     Heart sounds: No murmur heard.   Pulmonary:     Effort: Pulmonary effort is normal. No respiratory distress.     Breath sounds: Normal breath sounds.  Abdominal:     Palpations: Abdomen is soft.     Tenderness: There is no abdominal tenderness.  Musculoskeletal:        General: No edema.     Cervical back: Neck supple.   Skin:    General: Skin is warm and dry.  Neurological:     Mental Status: He is alert.  Psychiatric:        Mood and Affect: Mood and affect normal.     ED Results / Procedures / Treatments   Labs (all labs ordered are listed, but only abnormal results are displayed) Labs Reviewed  BASIC METABOLIC PANEL - Abnormal; Notable for the following components:      Result Value   CO2 20 (*)    Glucose, Bld 112 (*)    Creatinine, Ser 1.88 (*)    Calcium 7.5 (*)    GFR, Estimated 35 (*)    All other components within normal limits  CBC - Abnormal; Notable for the following components:   Hemoglobin 10.4 (*)    HCT 35.0 (*)    MCH 24.2 (*)    MCHC 29.7 (*)    RDW 23.5 (*)    Platelets 132 (*)    All other components within normal limits  TROPONIN I (HIGH SENSITIVITY)  TROPONIN I (HIGH SENSITIVITY)    EKG EKG Interpretation  Date/Time:  Sunday September 15 2020 12:39:19 EST Ventricular Rate:  84 PR Interval:  274 QRS Duration: 122 QT Interval:  402 QTC Calculation: 475 R Axis:   62 Text Interpretation: Sinus rhythm with 1st degree A-V block with Fusion complexes and Premature atrial complexes with Abberant conduction Right bundle branch block Abnormal ECG no acute STEMI no change from prior Confirmed by Madalyn Rob [phone]) on 09/15/2020 2:21:40 PM   Radiology DG Chest 2 View  Result Date: 09/15/2020 CLINICAL DATA:  Chest pain. EXAM: CHEST - 2 VIEW COMPARISON:  August 11, 2020. FINDINGS: The heart size and mediastinal contours are within normal limits. Both lungs are clear. No pneumothorax or pleural effusion is noted. The visualized skeletal structures are unremarkable. IMPRESSION: No active cardiopulmonary disease. Electronically Signed   By: Marijo Conception M.D.   On: 09/15/2020 13:19    Procedures Procedures   Medications Ordered in ED Medications - No data to display  ED Course  I have reviewed the triage vital signs and the nursing notes.  Pertinent labs &  imaging results that were available during my care of the patient were reviewed by me and considered in my medical decision making (see chart for details).    MDM Rules/Calculators/A&P  86 year old male presenting to the emergency room with concern for episode of palpitations.  When I assessed patient, he was well-appearing in no acute distress and denied any ongoing symptoms.  His EKG did not have any obvious ischemic changes.  Labs grossly stable.  On review of his cardiac monitor however, patient was noted to have brief sinus pauses.  Patient has a loop recorder, will need to get interrogated.   Signed out to Dr. Darl Householder - anticipate cardiology consult after device has been interrogated.     Final Clinical Impression(s) / ED Diagnoses Final diagnoses:  Palpitations    Rx / DC Orders ED Discharge Orders    None       Lucrezia Starch, MD 09/15/20 [phone]

## 2020-09-15 NOTE — ED Notes (Signed)
Trevor Murphy, Financial controller, is contacting [employer] per request of Dr. Darl Householder

## 2020-09-15 NOTE — ED Notes (Signed)
ED Provider, Dr. Darl Householder,  at bedside.

## 2020-09-15 NOTE — Discharge Instructions (Addendum)
Your heart rate is sometimes low. Your loop recorder showed no events   See your cardiologist for follow up   Continue eliquis   Return to ER if you have worse palpitations, chest pain, trouble breathing, passing out

## 2020-09-15 NOTE — ED Notes (Signed)
Nursing is not able to check implantable loop recorder.  MD has been notified

## 2020-09-18 DIAGNOSIS — D62 Acute posthemorrhagic anemia: Secondary | ICD-10-CM | POA: Diagnosis not present

## 2020-09-18 DIAGNOSIS — D631 Anemia in chronic kidney disease: Secondary | ICD-10-CM | POA: Diagnosis not present

## 2020-09-18 DIAGNOSIS — I480 Paroxysmal atrial fibrillation: Secondary | ICD-10-CM | POA: Diagnosis not present

## 2020-09-18 DIAGNOSIS — J9811 Atelectasis: Secondary | ICD-10-CM | POA: Diagnosis not present

## 2020-09-18 DIAGNOSIS — M170 Bilateral primary osteoarthritis of knee: Secondary | ICD-10-CM | POA: Diagnosis not present

## 2020-09-18 DIAGNOSIS — I2510 Atherosclerotic heart disease of native coronary artery without angina pectoris: Secondary | ICD-10-CM | POA: Diagnosis not present

## 2020-09-18 DIAGNOSIS — M1030 Gout due to renal impairment, unspecified site: Secondary | ICD-10-CM | POA: Diagnosis not present

## 2020-09-18 DIAGNOSIS — I5032 Chronic diastolic (congestive) heart failure: Secondary | ICD-10-CM | POA: Diagnosis not present

## 2020-09-18 DIAGNOSIS — Z9181 History of falling: Secondary | ICD-10-CM | POA: Diagnosis not present

## 2020-09-18 DIAGNOSIS — E782 Mixed hyperlipidemia: Secondary | ICD-10-CM | POA: Diagnosis not present

## 2020-09-18 DIAGNOSIS — K269 Duodenal ulcer, unspecified as acute or chronic, without hemorrhage or perforation: Secondary | ICD-10-CM | POA: Diagnosis not present

## 2020-09-18 DIAGNOSIS — H409 Unspecified glaucoma: Secondary | ICD-10-CM | POA: Diagnosis not present

## 2020-09-18 DIAGNOSIS — D649 Anemia, unspecified: Secondary | ICD-10-CM | POA: Diagnosis not present

## 2020-09-18 DIAGNOSIS — Z8673 Personal history of transient ischemic attack (TIA), and cerebral infarction without residual deficits: Secondary | ICD-10-CM | POA: Diagnosis not present

## 2020-09-18 DIAGNOSIS — Z7901 Long term (current) use of anticoagulants: Secondary | ICD-10-CM | POA: Diagnosis not present

## 2020-09-18 DIAGNOSIS — N1832 Chronic kidney disease, stage 3b: Secondary | ICD-10-CM | POA: Diagnosis not present

## 2020-09-18 DIAGNOSIS — D509 Iron deficiency anemia, unspecified: Secondary | ICD-10-CM | POA: Diagnosis not present

## 2020-09-18 DIAGNOSIS — I130 Hypertensive heart and chronic kidney disease with heart failure and stage 1 through stage 4 chronic kidney disease, or unspecified chronic kidney disease: Secondary | ICD-10-CM | POA: Diagnosis not present

## 2020-09-23 ENCOUNTER — Ambulatory Visit (INDEPENDENT_AMBULATORY_CARE_PROVIDER_SITE_OTHER): Payer: Medicare Other

## 2020-09-23 DIAGNOSIS — I639 Cerebral infarction, unspecified: Secondary | ICD-10-CM

## 2020-09-24 LAB — CUP PACEART REMOTE DEVICE CHECK
Date Time Interrogation Session: 20220214230806
Implantable Pulse Generator Implant Date: 20201014

## 2020-09-25 ENCOUNTER — Other Ambulatory Visit: Payer: Self-pay | Admitting: Nurse Practitioner

## 2020-09-25 ENCOUNTER — Other Ambulatory Visit: Payer: Self-pay

## 2020-09-25 ENCOUNTER — Encounter: Payer: Self-pay | Admitting: Podiatry

## 2020-09-25 ENCOUNTER — Ambulatory Visit (INDEPENDENT_AMBULATORY_CARE_PROVIDER_SITE_OTHER): Payer: Medicare Other | Admitting: Podiatry

## 2020-09-25 DIAGNOSIS — Z7901 Long term (current) use of anticoagulants: Secondary | ICD-10-CM | POA: Diagnosis not present

## 2020-09-25 DIAGNOSIS — H409 Unspecified glaucoma: Secondary | ICD-10-CM | POA: Diagnosis not present

## 2020-09-25 DIAGNOSIS — D62 Acute posthemorrhagic anemia: Secondary | ICD-10-CM | POA: Diagnosis not present

## 2020-09-25 DIAGNOSIS — I739 Peripheral vascular disease, unspecified: Secondary | ICD-10-CM

## 2020-09-25 DIAGNOSIS — N183 Chronic kidney disease, stage 3 unspecified: Secondary | ICD-10-CM | POA: Diagnosis not present

## 2020-09-25 DIAGNOSIS — E782 Mixed hyperlipidemia: Secondary | ICD-10-CM | POA: Diagnosis not present

## 2020-09-25 DIAGNOSIS — I480 Paroxysmal atrial fibrillation: Secondary | ICD-10-CM | POA: Diagnosis not present

## 2020-09-25 DIAGNOSIS — M1030 Gout due to renal impairment, unspecified site: Secondary | ICD-10-CM | POA: Diagnosis not present

## 2020-09-25 DIAGNOSIS — B351 Tinea unguium: Secondary | ICD-10-CM

## 2020-09-25 DIAGNOSIS — Z9181 History of falling: Secondary | ICD-10-CM | POA: Diagnosis not present

## 2020-09-25 DIAGNOSIS — I5032 Chronic diastolic (congestive) heart failure: Secondary | ICD-10-CM | POA: Diagnosis not present

## 2020-09-25 DIAGNOSIS — I2510 Atherosclerotic heart disease of native coronary artery without angina pectoris: Secondary | ICD-10-CM | POA: Diagnosis not present

## 2020-09-25 DIAGNOSIS — M79674 Pain in right toe(s): Secondary | ICD-10-CM | POA: Diagnosis not present

## 2020-09-25 DIAGNOSIS — J9811 Atelectasis: Secondary | ICD-10-CM | POA: Diagnosis not present

## 2020-09-25 DIAGNOSIS — D649 Anemia, unspecified: Secondary | ICD-10-CM | POA: Diagnosis not present

## 2020-09-25 DIAGNOSIS — I130 Hypertensive heart and chronic kidney disease with heart failure and stage 1 through stage 4 chronic kidney disease, or unspecified chronic kidney disease: Secondary | ICD-10-CM | POA: Diagnosis not present

## 2020-09-25 DIAGNOSIS — K269 Duodenal ulcer, unspecified as acute or chronic, without hemorrhage or perforation: Secondary | ICD-10-CM | POA: Diagnosis not present

## 2020-09-25 DIAGNOSIS — Z8673 Personal history of transient ischemic attack (TIA), and cerebral infarction without residual deficits: Secondary | ICD-10-CM | POA: Diagnosis not present

## 2020-09-25 DIAGNOSIS — D509 Iron deficiency anemia, unspecified: Secondary | ICD-10-CM | POA: Diagnosis not present

## 2020-09-25 DIAGNOSIS — M170 Bilateral primary osteoarthritis of knee: Secondary | ICD-10-CM | POA: Diagnosis not present

## 2020-09-25 DIAGNOSIS — M79675 Pain in left toe(s): Secondary | ICD-10-CM

## 2020-09-25 DIAGNOSIS — D631 Anemia in chronic kidney disease: Secondary | ICD-10-CM | POA: Diagnosis not present

## 2020-09-25 DIAGNOSIS — N1832 Chronic kidney disease, stage 3b: Secondary | ICD-10-CM | POA: Diagnosis not present

## 2020-09-25 MED ORDER — PANTOPRAZOLE SODIUM 40 MG PO TBEC: 40.0000 mg | DELAYED_RELEASE_TABLET | Freq: Two times a day (BID) | ORAL | 2 refills | Status: DC

## 2020-09-25 NOTE — Progress Notes (Signed)
This patient returns to my office for at risk foot care.  This patient requires this care by a professional since this patient will be at risk due to having chronic kidney disease and coagulation defect.  Patient is taking plavix. Patient presents to the office with hisnfemale caregiver.  This patient is unable to cut nails himself since the patient cannot reach his nails.These nails are painful walking and wearing shoes.  This patient presents for at risk foot care today.  General Appearance  Alert, conversant and in no acute stress.  Vascular  Dorsalis pedis and posterior tibial  pulses are weakly  palpable  bilaterally.  Capillary return is within normal limits  Bilaterally. Cold feet bilaterally. Absent digital hair  B/L.  Neurologic  Senn-Weinstein monofilament wire test within normal limits  bilaterally. Muscle power within normal limits bilaterally.  Nails Thick disfigured discolored nails with subungual debris  from hallux to fifth toes bilaterally. No evidence of bacterial infection or drainage bilaterally.  Orthopedic  No limitations of motion  feet .  No crepitus or effusions noted.  No bony pathology or digital deformities noted.  HAV  B/L.  Skin  normotropic skin with no porokeratosis noted bilaterally.  No signs of infections or ulcers noted.     Onychomycosis  Pain in right toes  Pain in left toes  Consent was obtained for treatment procedures.   Mechanical debridement of nails 1-5  bilaterally performed with a nail nipper.  Filed with dremel without incident.    Return office visit     10  weeks                Told patient to return for periodic foot care and evaluation due to potential at risk complications.   Gardiner Barefoot DPM

## 2020-09-30 ENCOUNTER — Ambulatory Visit (INDEPENDENT_AMBULATORY_CARE_PROVIDER_SITE_OTHER): Payer: Medicare Other | Admitting: Nurse Practitioner

## 2020-09-30 ENCOUNTER — Other Ambulatory Visit: Payer: Self-pay

## 2020-09-30 VITALS — BP 134/76 | HR 82 | Temp 98.0°F | Ht 64.0 in | Wt 132.0 lb

## 2020-09-30 DIAGNOSIS — R002 Palpitations: Secondary | ICD-10-CM | POA: Diagnosis not present

## 2020-09-30 DIAGNOSIS — D509 Iron deficiency anemia, unspecified: Secondary | ICD-10-CM

## 2020-09-30 DIAGNOSIS — Z741 Need for assistance with personal care: Secondary | ICD-10-CM | POA: Diagnosis not present

## 2020-09-30 DIAGNOSIS — R296 Repeated falls: Secondary | ICD-10-CM

## 2020-09-30 DIAGNOSIS — I480 Paroxysmal atrial fibrillation: Secondary | ICD-10-CM

## 2020-09-30 NOTE — Progress Notes (Signed)
Carelink Summary Report / Loop Recorder 

## 2020-09-30 NOTE — Progress Notes (Signed)
Rutherford Nail as a scribe for [name], FNP.,have documented all relevant documentation on the behalf of [name], FNP,as directed by  [name], FNP while in the presence of [name], [place]. This visit occurred during the SARS-CoV-2 public health emergency.  Safety protocols were in place, including screening questions prior to the visit, additional usage of staff PPE, and extensive cleaning of exam room while observing appropriate contact time as indicated for disinfecting solutions.  Subjective:     Patient ID: Trevor Murphy , male    DOB: [birth year] , 86 y.o.   MRN: [id number]   Chief Complaint  Patient presents with  . Follow-up    ER    HPI  Pt is here today for a ER follow up after having palpitations, he is compliant with all his medications and have no other complaints at this time.  He is feeling better now however he is being followed by Cardiology to see if he may need a pacemaker. He is currently wearing a loop recorder which did not show any events other than a brief period with his heart rate in the 40's otherwise was as low as 50's - 60's. He is scheduled to see Dr. Lorenso Courier Hematology for his anemia.  He may need an iron transfusion.  He is taking iron supplements as he should and his sister feels he is doing better and getting up and doing things more. He does not have PT and the SN coming out any longer.    His sister feels he needs help with his personal care. He is needing help with bathing, cooking, light cleaning, vision disturbance.  His sister shares that noone in the apartment complex (senior living) [address].      Past Medical History:  Diagnosis Date  . Arthritis   . CAD (coronary artery disease)    a. Cath 08/2009: minimal nonobstructive disease (25% LAD). b. Lexiscan nuc 6/215: EF 54%, no ischemia or infarction.  . Chronic diastolic CHF (congestive heart failure) ([place])    a. Echo 2013: EF 55-60%, mild AI, mild MR. b. Echo  5/15 with EF 60-65%, no WMA, mild LVH, mild AI.  [name] Kitchen CKD (chronic kidney disease) stage 3, GFR 30-59 ml/min (HCC) 07/26/2012   a. Felt likely due to HTN.  . CVA (cerebral infarction) 07/28/2012  . Glaucoma   . Gout   . H/O umbilical hernia repair   . Headache(784.0)    " WHEN MY BLOOD PRESSURE IS UP"  . History of acute renal failure   . History of alcohol abuse   . HTN (hypertension)   . Hypertension    a. Patent renal arteries 2005.  [name] Kitchen Noncompliance   . OA (osteoarthritis)   . Other and unspecified hyperlipidemia   . Poor circulation   . Sinus bradycardia   . SVT (supraventricular tachycardia) ([place])    a. Possible AVNRT.  Got adenosine from EMS in 2015 for SVT.  During 5/15 hospitalization had episode of short R-P tachycardia (possible AVNRT) terminated by adenosine.  b. 04/2016 - recurrent tachycardia (SVT vs. AVNRT), again terminated by Adenosine  . Visual impairment      Family History  Problem Relation Age of Onset  . CVA Mother   . Hypertension Brother   . Hypertension Sister   . Hypertension Brother   . Stroke Sister   . Heart attack Neg Hx      Current Outpatient Medications:  .  acetaminophen (TYLENOL) 650 MG CR tablet,  Take 650 mg by mouth every 8 (eight) hours as needed for pain., Disp: , Rfl:  .  apixaban (ELIQUIS) 2.5 MG TABS tablet, Take 1 tablet (2.5 mg total) by mouth 2 (two) times daily., Disp: 60 tablet, Rfl: 11 .  atorvastatin (LIPITOR) 80 MG tablet, Take 1 tablet (80 mg total) by mouth daily at 6 PM., Disp: 90 tablet, Rfl: 1 .  ferrous sulfate 325 (65 FE) MG tablet, Take 1 tablet (325 mg total) by mouth daily., Disp: 90 tablet, Rfl: 1 .  LUMIGAN 0.01 % SOLN, Place 1 drop into both eyes at bedtime., Disp: , Rfl:  .  pantoprazole (PROTONIX) 40 MG tablet, Take 1 tablet (40 mg total) by mouth 2 (two) times daily., Disp: 60 tablet, Rfl: 2   No Known Allergies   Review of Systems  Constitutional: Negative.  Negative for fatigue.  HENT: Negative.    Respiratory: Negative.   Cardiovascular: Negative.   Endocrine: Negative for polydipsia, polyphagia and polyuria.  Musculoskeletal: Negative.   Skin: Negative.   Neurological: Negative for dizziness and headaches.  Psychiatric/Behavioral: Negative.      Today's Vitals   09/30/20 1052  BP: 134/76  Pulse: 82  Temp: 98 F (36.7 C)  TempSrc: Oral  Weight: 132 lb (59.9 kg)  Height: 5\' 4"  (1.626 m)   Body mass index is 22.66 kg/m.  Wt Readings from Last 3 Encounters:  09/30/20 132 lb (59.9 kg)  09/12/20 130 lb (59 kg)  08/23/20 133 lb 6.4 oz (60.5 kg)   Objective:  Physical Exam Vitals reviewed.  Constitutional:      General: He is not in acute distress.    Appearance: Normal appearance.  Eyes:     Extraocular Movements: Extraocular movements intact.     Conjunctiva/sclera: Conjunctivae normal.     Pupils: Pupils are equal, round, and reactive to light.  Cardiovascular:     Rate and Rhythm: Normal rate and regular rhythm.     Pulses: Normal pulses.     Heart sounds: Normal heart sounds. No murmur heard.   Pulmonary:     Effort: Pulmonary effort is normal. No respiratory distress.     Breath sounds: Normal breath sounds. No wheezing.  Skin:    General: Skin is warm and dry.     Capillary Refill: Capillary refill takes less than 2 seconds.  Neurological:     General: No focal deficit present.     Mental Status: He is alert and oriented to person, place, and time.     Cranial Nerves: No cranial nerve deficit.  Psychiatric:        Mood and Affect: Mood normal.        Behavior: Behavior normal.        Thought Content: Thought content normal.        Judgment: Judgment normal.         Assessment And Plan:     1. Iron deficiency anemia, unspecified iron deficiency anemia type  His Hgb has improved to 10.4 after having 2 blood transfusions while hospitalized in January.    He states he feels better  He is to continue follow up with his Hematologist - AMB  Referral to [facility]  2. Palpitations  Recent ER visit did not reveal any cause, currently has a loop recorder  3. Paroxysmal atrial fibrillation ([place])  Continue with eliquis  Continue with follow up with Cardiology  4. Frequent falls  Over the last several months he has been having more  falls  Will refer to [facility] to see if there are any resources available for him  Has completed PT from his last hospital stay  He continues to take eliquis - AMB Referral to [facility]  5. Need for assistance with personal care  Will complete form for PCS he is requiring more care with his ADLs getting dressed, light cleaning and cooking with meal presp   Patient was given opportunity to ask questions. Patient verbalized understanding of the plan and was able to repeat key elements of the plan. All questions were answered to their satisfaction.  [name], FNP    I, [name], FNP, have reviewed all documentation for this visit. The documentation on 09/30/20 for the exam, diagnosis, procedures, and orders are all accurate and complete.   THE PATIENT IS ENCOURAGED TO PRACTICE SOCIAL DISTANCING DUE TO THE COVID-19 PANDEMIC.

## 2020-10-08 ENCOUNTER — Encounter: Payer: Self-pay | Admitting: Nurse Practitioner

## 2020-10-09 ENCOUNTER — Telehealth: Payer: Self-pay | Admitting: *Deleted

## 2020-10-09 NOTE — Chronic Care Management (AMB) (Signed)
  Chronic Care Management   Outreach Note  10/09/2020 Name: Trevor Murphy MRN: [id number] DOB: 01/18/1935  Trevor Murphy is a 86 y.o. year old male who is a primary care patient of [name], [place]. I reached out to [name] by phone today in response to a referral sent by [name]'s PCP, [name], FNP.  An unsuccessful telephone outreach was attempted today. The patient was referred to the case management team for assistance with care management and care coordination.   Follow Up Plan: A HIPAA compliant phone message was left for the patient providing contact information and requesting a return call. The care management team will reach out to the patient again over the next 7 days. If patient returns call to provider office, please advise to call [facility] at [phone].  [facility]

## 2020-10-11 ENCOUNTER — Encounter (HOSPITAL_COMMUNITY): Payer: Self-pay | Admitting: Pharmacy Technician

## 2020-10-11 ENCOUNTER — Emergency Department (HOSPITAL_COMMUNITY)
Admission: EM | Admit: 2020-10-11 | Discharge: 2020-10-11 | Disposition: A | Discharge status: Home / Self Care | Payer: Medicare Other | Attending: Emergency Medicine | Admitting: Emergency Medicine

## 2020-10-11 ENCOUNTER — Other Ambulatory Visit: Payer: Self-pay

## 2020-10-11 ENCOUNTER — Emergency Department (HOSPITAL_COMMUNITY): Payer: Medicare Other

## 2020-10-11 DIAGNOSIS — I130 Hypertensive heart and chronic kidney disease with heart failure and stage 1 through stage 4 chronic kidney disease, or unspecified chronic kidney disease: Secondary | ICD-10-CM | POA: Diagnosis not present

## 2020-10-11 DIAGNOSIS — R079 Chest pain, unspecified: Secondary | ICD-10-CM | POA: Diagnosis not present

## 2020-10-11 DIAGNOSIS — N1832 Chronic kidney disease, stage 3b: Secondary | ICD-10-CM | POA: Insufficient documentation

## 2020-10-11 DIAGNOSIS — R002 Palpitations: Secondary | ICD-10-CM | POA: Insufficient documentation

## 2020-10-11 DIAGNOSIS — I5032 Chronic diastolic (congestive) heart failure: Secondary | ICD-10-CM | POA: Insufficient documentation

## 2020-10-11 DIAGNOSIS — Z7901 Long term (current) use of anticoagulants: Secondary | ICD-10-CM | POA: Diagnosis not present

## 2020-10-11 DIAGNOSIS — I2510 Atherosclerotic heart disease of native coronary artery without angina pectoris: Secondary | ICD-10-CM | POA: Insufficient documentation

## 2020-10-11 LAB — BASIC METABOLIC PANEL
Anion gap: 8 (ref 5–15)
BUN: 16 mg/dL (ref 8–23)
CO2: 20 mmol/L — ABNORMAL LOW (ref 22–32)
Calcium: 7.7 mg/dL — ABNORMAL LOW (ref 8.9–10.3)
Chloride: 108 mmol/L (ref 98–111)
Creatinine, Ser: 1.66 mg/dL — ABNORMAL HIGH (ref 0.61–1.24)
GFR, Estimated: 41 mL/min — ABNORMAL LOW (ref >60)
Glucose, Bld: 102 mg/dL — ABNORMAL HIGH (ref 70–99)
Potassium: 3.9 mmol/L (ref 3.5–5.1)
Sodium: 136 mmol/L (ref 135–145)

## 2020-10-11 LAB — CBC
HCT: 37.1 % — ABNORMAL LOW (ref 39.0–52.0)
Hemoglobin: 10.6 g/dL — ABNORMAL LOW (ref 13.0–17.0)
MCH: 23.9 pg — ABNORMAL LOW (ref 26.0–34.0)
MCHC: 28.6 g/dL — ABNORMAL LOW (ref 30.0–36.0)
MCV: 83.6 fL (ref 80.0–100.0)
Platelets: 108 10*3/uL — ABNORMAL LOW (ref 150–400)
RBC: 4.44 MIL/uL (ref 4.22–5.81)
RDW: 21.9 % — ABNORMAL HIGH (ref 11.5–15.5)
WBC: 3.3 10*3/uL — ABNORMAL LOW (ref 4.0–10.5)
nRBC: 0 % (ref 0.0–0.2)

## 2020-10-11 LAB — TROPONIN I (HIGH SENSITIVITY)
Troponin I (High Sensitivity): 11 ng/L (ref <18)
Troponin I (High Sensitivity): 14 ng/L (ref <18)

## 2020-10-11 NOTE — ED Provider Notes (Signed)
[facility] EMERGENCY DEPARTMENT Provider Note   CSN: [id number] Arrival date & time: 10/11/20  1035     History Chief Complaint  Patient presents with  . Chest Pain    Trevor Murphy is a 86 y.o. male.  HPI 86 year old male presents with palpitations.  Around 2 hours ago he was going to the bathroom to urinate and felt acute palpitations/rapid heart rate.  Felt like he was lightheaded like he was going to pass out but never did.  Did not actually have chest pain and has no shortness of breath.  This lasted in total 15-20 minutes.  Right now he feels well.  No recent or concomitant illness.  Past Medical History:  Diagnosis Date  . Arthritis   . CAD (coronary artery disease)    a. Cath 08/2009: minimal nonobstructive disease (25% LAD). b. Lexiscan nuc 6/215: EF 54%, no ischemia or infarction.  . Chronic diastolic CHF (congestive heart failure) ([place])    a. Echo 2013: EF 55-60%, mild AI, mild MR. b. Echo 5/15 with EF 60-65%, no WMA, mild LVH, mild AI.  [name] Kitchen CKD (chronic kidney disease) stage 3, GFR 30-59 ml/min (HCC) 07/26/2012   a. Felt likely due to HTN.  . CVA (cerebral infarction) 07/28/2012  . Glaucoma   . Gout   . H/O umbilical hernia repair   . Headache(784.0)    " WHEN MY BLOOD PRESSURE IS UP"  . History of acute renal failure   . History of alcohol abuse   . HTN (hypertension)   . Hypertension    a. Patent renal arteries 2005.  [name] Kitchen Noncompliance   . OA (osteoarthritis)   . Other and unspecified hyperlipidemia   . Poor circulation   . Sinus bradycardia   . SVT (supraventricular tachycardia) ([place])    a. Possible AVNRT.  Got adenosine from EMS in 2015 for SVT.  During 5/15 hospitalization had episode of short R-P tachycardia (possible AVNRT) terminated by adenosine.  b. 04/2016 - recurrent tachycardia (SVT vs. AVNRT), again terminated by Adenosine  . Visual impairment     Patient Active Problem List   Diagnosis Date Noted  . Mixed hyperlipidemia  08/12/2020  . Acute blood loss anemia 08/11/2020  . Paroxysmal atrial fibrillation ([place]) 06/07/2020  . Secondary hypercoagulable state ([place]) 06/07/2020  . Ischemic cerebrovascular accident (CVA) of frontal lobe ([place]) 05/20/2019  . Pain due to onychomycosis of toenails of both feet 01/20/2019  . Prediabetes 07/22/2018  . Nail abnormality 07/22/2018  . Sinus bradycardia 05/02/2016  . Hypotension due to drugs 05/02/2016  . History of tachycardia 05/02/2016  . GERD (gastroesophageal reflux disease) 02/05/2015  . Coronary artery disease involving native coronary artery of native heart without angina pectoris 04/20/2014  . Atypical chest pain 04/12/2014  . Hypertensive urgency 03/11/2014  . Chronic diastolic CHF (congestive heart failure) ([place]) 01/30/2014  . Alcohol abuse 01/19/2014  . Elevated troponin level not due myocardial infarction 12/19/2013  . Gout 12/19/2013  . Dyslipidemia 12/16/2013  . Hypertensive crisis 12/16/2013  . Cerebral infarction ([place]) 07/28/2012  . Acute on chronic renal failure ([place]) 07/27/2012  . Essential hypertension 07/26/2012  . Chronic kidney disease, stage 3b ([place]) 07/26/2012  . Poor circulation   . Recurrent ventral hernia 02/16/2012  . Hernia (acquired) (recurrent) 02/10/2011    Past Surgical History:  Procedure Laterality Date  . APPENDECTOMY    . BIOPSY  08/12/2020   Procedure: BIOPSY;  Surgeon: Juanita Craver, MD;  Location: [facility] ENDOSCOPY;  Service: Endoscopy;;  .  BUBBLE STUDY  05/24/2019   Procedure: BUBBLE STUDY;  Surgeon: Donato Heinz, MD;  Location: [facility];  Service: Endoscopy;;  . CARDIAC CATHETERIZATION  11/2003   EF 50-55%  . CARDIAC CATHETERIZATION  08/2009  . CATARACT EXTRACTION EXTRACAPSULAR Right 05/17/2013   Procedure: CATARACT EXTRACTION EXTRACAPSULAR WITH INTRAOCULAR LENS PLACEMENT (IOC) RIGHT EYE;  Surgeon: Marylynn Pearson, MD;  Location: [facility];  Service: Ophthalmology;  Laterality: Right;  . COLONOSCOPY    .  ESOPHAGOGASTRODUODENOSCOPY (EGD) WITH PROPOFOL N/A 08/12/2020   Procedure: ESOPHAGOGASTRODUODENOSCOPY (EGD) WITH PROPOFOL;  Surgeon: Juanita Craver, MD;  Location: [facility] ENDOSCOPY;  Service: Endoscopy;  Laterality: N/A;  . HERNIA REPAIR    . LOOP RECORDER INSERTION N/A 05/24/2019   Procedure: LOOP RECORDER INSERTION;  Surgeon: Constance Haw, MD;  Location: [facility] CV LAB;  Service: Cardiovascular;  Laterality: N/A;  . TEE WITHOUT CARDIOVERSION N/A 05/24/2019   Procedure: TRANSESOPHAGEAL ECHOCARDIOGRAM (TEE);  Surgeon: Donato Heinz, MD;  Location: [facility] ENDOSCOPY;  Service: Endoscopy;  Laterality: N/A;  . VENTRAL HERNIA REPAIR  03/07/2012   Procedure: LAPAROSCOPIC VENTRAL HERNIA;  Surgeon: Gwenyth Ober, MD;  Location: [place];  Service: General;  Laterality: N/A;       Family History  Problem Relation Age of Onset  . CVA Mother   . Hypertension Brother   . Hypertension Sister   . Hypertension Brother   . Stroke Sister   . Heart attack Neg Hx     Social History   Tobacco Use  . Smoking status: Never Smoker  . Smokeless tobacco: Never Used  Vaping Use  . Vaping Use: Never used  Substance Use Topics  . Alcohol use: No    Alcohol/week: 1.0 standard drink    Types: 1 Cans of beer per week    Comment: h/o heavy use, quit in 2017  . Drug use: No    Home Medications Prior to Admission medications   Medication Sig Start Date End Date Taking? Authorizing Provider  acetaminophen (TYLENOL) 650 MG CR tablet Take 650 mg by mouth every 8 (eight) hours as needed for pain.   Yes [provider]  apixaban (ELIQUIS) 2.5 MG TABS tablet Take 1 tablet (2.5 mg total) by mouth 2 (two) times daily. 07/19/20  Yes Fenton, Clint R, PA  atorvastatin (LIPITOR) 80 MG tablet Take 1 tablet (80 mg total) by mouth daily at 6 PM. 03/12/20  Yes [name], FNP  ferrous sulfate 325 (65 FE) MG tablet Take 1 tablet (325 mg total) by mouth daily. 03/12/20 03/12/21 Yes [name], FNP  LUMIGAN  0.01 % SOLN Place 1 drop into both eyes at bedtime. 08/28/19  Yes [provider]  pantoprazole (PROTONIX) 40 MG tablet Take 1 tablet (40 mg total) by mouth 2 (two) times daily. 09/25/20  Yes [name], FNP    Allergies    Patient has no known allergies.  Review of Systems   Review of Systems  Constitutional: Negative for fever.  Respiratory: Negative for cough and shortness of breath.   Cardiovascular: Positive for palpitations. Negative for chest pain.  Gastrointestinal: Negative for diarrhea.  All other systems reviewed and are negative.   Physical Exam Updated Vital Signs BP (!) 164/89   Pulse 66   Temp 97.7 F (36.5 C) (Oral)   Resp 19   SpO2 100%   Physical Exam Vitals and nursing note reviewed.  Constitutional:      Appearance: He is well-developed and well-nourished.  HENT:  Head: Normocephalic and atraumatic.     Right Ear: External ear normal.     Left Ear: External ear normal.     Nose: Nose normal.  Eyes:     General:        Right eye: No discharge.        Left eye: No discharge.  Cardiovascular:     Rate and Rhythm: Normal rate and regular rhythm.     Heart sounds: Normal heart sounds.     Comments: Occasional PVCs seen on monitor Pulmonary:     Effort: Pulmonary effort is normal.     Breath sounds: Normal breath sounds.  Abdominal:     Palpations: Abdomen is soft.     Tenderness: There is no abdominal tenderness.  Musculoskeletal:        General: No edema.     Cervical back: Neck supple.  Skin:    General: Skin is warm and dry.  Neurological:     Mental Status: He is alert.  Psychiatric:        Mood and Affect: Mood is not anxious.     ED Results / Procedures / Treatments   Labs (all labs ordered are listed, but only abnormal results are displayed) Labs Reviewed  BASIC METABOLIC PANEL - Abnormal; Notable for the following components:      Result Value   CO2 20 (*)    Glucose, Bld 102 (*)    Creatinine, Ser 1.66 (*)     Calcium 7.7 (*)    GFR, Estimated 41 (*)    All other components within normal limits  CBC - Abnormal; Notable for the following components:   WBC 3.3 (*)    Hemoglobin 10.6 (*)    HCT 37.1 (*)    MCH 23.9 (*)    MCHC 28.6 (*)    RDW 21.9 (*)    Platelets 108 (*)    All other components within normal limits  TROPONIN I (HIGH SENSITIVITY)  TROPONIN I (HIGH SENSITIVITY)    EKG EKG Interpretation  Date/Time:  Friday October 11 2020 10:41:39 EST Ventricular Rate:  87 PR Interval:  268 QRS Duration: 132 QT Interval:  400 QTC Calculation: 481 R Axis:   75 Text Interpretation: Sinus rhythm with 1st degree A-V block with Premature atrial complexes with Abberant conduction Right bundle branch block similar to Feb 2022 Confirmed by Sherwood Gambler [phone]) on 10/11/2020 11:07:21 AM   Radiology DG Chest 2 View  Result Date: 10/11/2020 CLINICAL DATA:  Chest pain for 1 week EXAM: CHEST - 2 VIEW COMPARISON:  09/15/2020 FINDINGS: Cardiac shadow is within normal limits. Loop recorder is again noted and stable. Aortic calcifications are seen. The lungs are well aerated bilaterally. No focal infiltrate is seen. No bony abnormality is noted. IMPRESSION: No acute abnormality seen. Electronically Signed   By: Inez Catalina M.D.   On: 10/11/2020 11:28    Procedures Procedures   Medications Ordered in ED Medications - No data to display  ED Course  I have reviewed the triage vital signs and the nursing notes.  Pertinent labs & imaging results that were available during my care of the patient were reviewed by me and considered in my medical decision making (see chart for details).    MDM Rules/Calculators/A&P                          Patient endorses no chest pain.  Sounds more like palpitations.  His loop recorder  was interrogated with no events today or in the last 4 days.  Unclear why he was having the symptoms.  Troponins were evaluated and are negative. He has cardiology follow up next week.  Will d/c. Final Clinical Impression(s) / ED Diagnoses Final diagnoses:  Palpitations    Rx / DC Orders ED Discharge Orders    None       Sherwood Gambler, MD 10/11/20 1512

## 2020-10-11 NOTE — ED Triage Notes (Signed)
Pt reports chest discomfort and feeling like his heart was beating out of his chest onset approx 2 hours pta. Pt also endorses weakness.

## 2020-10-11 NOTE — Discharge Instructions (Addendum)
If you develop recurrent, continued, or worsening chest pain, shortness of breath, fever, vomiting, abdominal or back pain, or any other new/concerning symptoms then return to the ER for evaluation.  

## 2020-10-16 NOTE — Chronic Care Management (AMB) (Signed)
  Chronic Care Management   Note  10/16/2020 Name: Trevor Murphy MRN: [id number] DOB: 08/21/1934  Trevor Murphy is a 86 y.o. year old male who is a primary care patient of [name], [place]. I reached out to [name] by phone today in response to a referral sent by [name]'s PCP, [name], FNP     [name] was given information about Chronic Care Management services today including:  1. CCM service includes personalized support from designated clinical staff supervised by his physician, including individualized plan of care and coordination with other care providers 2. 24/7 contact phone numbers for assistance for urgent and routine care needs. 3. Service will only be billed when office clinical staff spend 20 minutes or more in a month to coordinate care. 4. Only one practitioner may furnish and bill the service in a calendar month. 5. The patient may stop CCM services at any time (effective at the end of the month) by phone call to the office staff. 6. The patient will be responsible for cost sharing (co-pay) of up to 20% of the service fee (after annual deductible is met).  Confirmed by sister [name],[name] DPR on file verbally agreed to assistance and services provided by embedded care coordination/care management team today.  Follow up plan: Telephone appointment with care management team member scheduled for:10/24/2020  [facility]

## 2020-10-18 ENCOUNTER — Other Ambulatory Visit: Payer: Self-pay

## 2020-10-18 ENCOUNTER — Ambulatory Visit (HOSPITAL_COMMUNITY)
Admission: RE | Admit: 2020-10-18 | Discharge: 2020-10-18 | Disposition: A | Discharge status: Home / Self Care | Payer: Medicare Other | Source: Ambulatory Visit | Attending: Physician Assistant | Admitting: Physician Assistant

## 2020-10-18 ENCOUNTER — Encounter (HOSPITAL_COMMUNITY): Payer: Self-pay | Admitting: Physician Assistant

## 2020-10-18 ENCOUNTER — Inpatient Hospital Stay: Payer: Medicare Other

## 2020-10-18 ENCOUNTER — Other Ambulatory Visit: Payer: Self-pay | Admitting: Hematology and Oncology

## 2020-10-18 ENCOUNTER — Inpatient Hospital Stay: Payer: Medicare Other | Attending: Hematology and Oncology | Admitting: Hematology and Oncology

## 2020-10-18 VITALS — BP 148/80 | HR 67 | Ht 64.0 in | Wt 129.6 lb

## 2020-10-18 VITALS — BP 135/73 | HR 71 | Temp 97.8°F | Resp 18 | Ht 64.0 in | Wt 128.0 lb

## 2020-10-18 DIAGNOSIS — D500 Iron deficiency anemia secondary to blood loss (chronic): Secondary | ICD-10-CM

## 2020-10-18 DIAGNOSIS — D696 Thrombocytopenia, unspecified: Secondary | ICD-10-CM | POA: Diagnosis not present

## 2020-10-18 DIAGNOSIS — Z79899 Other long term (current) drug therapy: Secondary | ICD-10-CM | POA: Diagnosis not present

## 2020-10-18 DIAGNOSIS — I10 Essential (primary) hypertension: Secondary | ICD-10-CM | POA: Insufficient documentation

## 2020-10-18 DIAGNOSIS — R002 Palpitations: Secondary | ICD-10-CM | POA: Diagnosis not present

## 2020-10-18 DIAGNOSIS — I2510 Atherosclerotic heart disease of native coronary artery without angina pectoris: Secondary | ICD-10-CM | POA: Diagnosis not present

## 2020-10-18 DIAGNOSIS — D6869 Other thrombophilia: Secondary | ICD-10-CM | POA: Diagnosis not present

## 2020-10-18 DIAGNOSIS — I480 Paroxysmal atrial fibrillation: Secondary | ICD-10-CM | POA: Insufficient documentation

## 2020-10-18 DIAGNOSIS — Z7901 Long term (current) use of anticoagulants: Secondary | ICD-10-CM | POA: Insufficient documentation

## 2020-10-18 DIAGNOSIS — K922 Gastrointestinal hemorrhage, unspecified: Secondary | ICD-10-CM | POA: Insufficient documentation

## 2020-10-18 LAB — CBC WITH DIFFERENTIAL (CANCER CENTER ONLY)
Abs Immature Granulocytes: 0.01 10*3/uL (ref 0.00–0.07)
Basophils Absolute: 0 10*3/uL (ref 0.0–0.1)
Basophils Relative: 1 %
Eosinophils Absolute: 0.1 10*3/uL (ref 0.0–0.5)
Eosinophils Relative: 3 %
HCT: 38.9 % — ABNORMAL LOW (ref 39.0–52.0)
Hemoglobin: 11.5 g/dL — ABNORMAL LOW (ref 13.0–17.0)
Immature Granulocytes: 0 %
Lymphocytes Relative: 18 %
Lymphs Abs: 0.6 10*3/uL — ABNORMAL LOW (ref 0.7–4.0)
MCH: 24.1 pg — ABNORMAL LOW (ref 26.0–34.0)
MCHC: 29.6 g/dL — ABNORMAL LOW (ref 30.0–36.0)
MCV: 81.6 fL (ref 80.0–100.0)
Monocytes Absolute: 0.3 10*3/uL (ref 0.1–1.0)
Monocytes Relative: 8 %
Neutro Abs: 2.4 10*3/uL (ref 1.7–7.7)
Neutrophils Relative %: 70 %
Platelet Count: 127 10*3/uL — ABNORMAL LOW (ref 150–400)
RBC: 4.77 MIL/uL (ref 4.22–5.81)
RDW: 21 % — ABNORMAL HIGH (ref 11.5–15.5)
WBC Count: 3.4 10*3/uL — ABNORMAL LOW (ref 4.0–10.5)
nRBC: 0 % (ref 0.0–0.2)

## 2020-10-18 LAB — CMP (CANCER CENTER ONLY)
ALT: 11 U/L (ref 0–44)
AST: 18 U/L (ref 15–41)
Albumin: 4 g/dL (ref 3.5–5.0)
Alkaline Phosphatase: 105 U/L (ref 38–126)
Anion gap: 6 (ref 5–15)
BUN: 17 mg/dL (ref 8–23)
CO2: 23 mmol/L (ref 22–32)
Calcium: 8 mg/dL — ABNORMAL LOW (ref 8.9–10.3)
Chloride: 107 mmol/L (ref 98–111)
Creatinine: 1.93 mg/dL — ABNORMAL HIGH (ref 0.61–1.24)
GFR, Estimated: 34 mL/min — ABNORMAL LOW (ref >60)
Glucose, Bld: 120 mg/dL — ABNORMAL HIGH (ref 70–99)
Potassium: 4.2 mmol/L (ref 3.5–5.1)
Sodium: 136 mmol/L (ref 135–145)
Total Bilirubin: 0.6 mg/dL (ref 0.3–1.2)
Total Protein: 7.9 g/dL (ref 6.5–8.1)

## 2020-10-18 LAB — RETIC PANEL
Immature Retic Fract: 10.1 % (ref 2.3–15.9)
RBC.: 4.84 MIL/uL (ref 4.22–5.81)
Retic Count, Absolute: 37.3 10*3/uL (ref 19.0–186.0)
Retic Ct Pct: 0.8 % (ref 0.4–3.1)
Reticulocyte Hemoglobin: 29.9 pg (ref >27.9)

## 2020-10-18 NOTE — Progress Notes (Signed)
Primary Care Physician: [name], [place] Primary Cardiologist: Dr Aundra Dubin (remotely) Primary Electrophysiologist: Dr Curt Bears  Referring Physician: Dr Elin[facility]   Trevor Murphy is a 86 y.o. male with a history of SVT, non obstructive CAD, chronic diastolic CHF, anemia, CKD stage III, prior CVA, HTN, and paroxysmal atrial fibrillation who presents for follow up in the [facility]. The patient was initially diagnosed with atrial fibrillation 06/06/20 on ILR following a cryptogenic stroke in 05/2019. Patient was asymptomatic and rate controlled during the episodes. Some appear to be true afib and others SR with PACs. All < 10 minutes. Patient has a CHADS2VASC score of 6.   On follow up today, patient reports he has done reasonably well since his last visit. He has been to the ED twice with palpitations which lasted about 1 hour after a BM. At both visits he was in [place] on presentation. ILR shows no arrhythmia events. He was also admitted 08/11/20 with acute anemia and melenic stools. Workup per GI revealed duodenal ulcers, cleared to resume anticoagulation. He reports no melenic stools in the interim.   Today, he denies symptoms of chest pain, shortness of breath, orthopnea, PND, lower extremity edema, dizziness, presyncope, syncope, snoring, daytime somnolence, bleeding, or neurologic sequela. The patient is tolerating medications without difficulties and is otherwise without complaint today.    Atrial Fibrillation Risk Factors:  he does not have symptoms or diagnosis of sleep apnea. he does not have a history of rheumatic fever. he does not have a history of alcohol use.   he has a BMI of Body mass index is 22.25 kg/m.[name] Kitchen Filed Weights   10/18/20 1055  Weight: 58.8 kg    Family History  Problem Relation Age of Onset  . CVA Mother   . Hypertension Brother   . Hypertension Sister   . Hypertension Brother   . Stroke Sister   . Heart attack Neg Hx       Atrial Fibrillation Management history:  Previous antiarrhythmic drugs: none Previous cardioversions: none Previous ablations: none CHADS2VASC score: 6 Anticoagulation history: Eliquis   Past Medical History:  Diagnosis Date  . Arthritis   . CAD (coronary artery disease)    a. Cath 08/2009: minimal nonobstructive disease (25% LAD). b. Lexiscan nuc 6/215: EF 54%, no ischemia or infarction.  . Chronic diastolic CHF (congestive heart failure) ([place])    a. Echo 2013: EF 55-60%, mild AI, mild MR. b. Echo 5/15 with EF 60-65%, no WMA, mild LVH, mild AI.  [name] Kitchen CKD (chronic kidney disease) stage 3, GFR 30-59 ml/min (HCC) 07/26/2012   a. Felt likely due to HTN.  . CVA (cerebral infarction) 07/28/2012  . Glaucoma   . Gout   . H/O umbilical hernia repair   . Headache(784.0)    " WHEN MY BLOOD PRESSURE IS UP"  . History of acute renal failure   . History of alcohol abuse   . HTN (hypertension)   . Hypertension    a. Patent renal arteries 2005.  [name] Kitchen Noncompliance   . OA (osteoarthritis)   . Other and unspecified hyperlipidemia   . Poor circulation   . Sinus bradycardia   . SVT (supraventricular tachycardia) ([place])    a. Possible AVNRT.  Got adenosine from EMS in 2015 for SVT.  During 5/15 hospitalization had episode of short R-P tachycardia (possible AVNRT) terminated by adenosine.  b. 04/2016 - recurrent tachycardia (SVT vs. AVNRT), again terminated by Adenosine  . Visual impairment  Past Surgical History:  Procedure Laterality Date  . APPENDECTOMY    . BIOPSY  08/12/2020   Procedure: BIOPSY;  Surgeon: Juanita Craver, MD;  Location[facility] ENDOSCOPY;  Service: Endoscopy;;  . BUBBLE STUDY  05/24/2019   Procedure: BUBBLE STUDY;  Surgeon: Donato Heinz, MD;  Location: [facility];  Service: Endoscopy;;  . CARDIAC CATHETERIZATION  11/2003   EF 50-55%  . CARDIAC CATHETERIZATION  08/2009  . CATARACT EXTRACTION EXTRACAPSULAR Right 05/17/2013   Procedure: CATARACT EXTRACTION  EXTRACAPSULAR WITH INTRAOCULAR LENS PLACEMENT (IOC) RIGHT EYE;  Surgeon: Marylynn Pearson, MD;  Location: [place];  Service: Ophthalmology;  Laterality: Right;  . COLONOSCOPY    . ESOPHAGOGASTRODUODENOSCOPY (EGD) WITH PROPOFOL N/A 08/12/2020   Procedure: ESOPHAGOGASTRODUODENOSCOPY (EGD) WITH PROPOFOL;  Surgeon: Juanita Craver, MD;  Location: [facility] ENDOSCOPY;  Service: Endoscopy;  Laterality: N/A;  . HERNIA REPAIR    . LOOP RECORDER INSERTION N/A 05/24/2019   Procedure: LOOP RECORDER INSERTION;  Surgeon: Constance Haw, MD;  Location: [place] CV LAB;  Service: Cardiovascular;  Laterality: N/A;  . TEE WITHOUT CARDIOVERSION N/A 05/24/2019   Procedure: TRANSESOPHAGEAL ECHOCARDIOGRAM (TEE);  Surgeon: Donato Heinz, MD;  Location: [facility] ENDOSCOPY;  Service: Endoscopy;  Laterality: N/A;  . VENTRAL HERNIA REPAIR  03/07/2012   Procedure: LAPAROSCOPIC VENTRAL HERNIA;  Surgeon: Gwenyth Ober, MD;  Location: [place];  Service: General;  Laterality: N/A;    Current Outpatient Medications  Medication Sig Dispense Refill  . acetaminophen (TYLENOL) 650 MG CR tablet Take 650 mg by mouth every 8 (eight) hours as needed for pain.    [name] Kitchen apixaban (ELIQUIS) 2.5 MG TABS tablet Take 1 tablet (2.5 mg total) by mouth 2 (two) times daily. 60 tablet 11  . atorvastatin (LIPITOR) 80 MG tablet Take 1 tablet (80 mg total) by mouth daily at 6 PM. 90 tablet 1  . ferrous sulfate 325 (65 FE) MG tablet Take 1 tablet (325 mg total) by mouth daily. 90 tablet 1  . LUMIGAN 0.01 % SOLN Place 1 drop into both eyes at bedtime.    . pantoprazole (PROTONIX) 40 MG tablet Take 1 tablet (40 mg total) by mouth 2 (two) times daily. 60 tablet 2   No current facility-administered medications for this encounter.    No Known Allergies  Social History   Socioeconomic History  . Marital status: Married    Spouse name: Not on file  . Number of children: Not on file  . Years of education: Not on file  . Highest education level: Not on file   Occupational History  . Occupation: retired  Tobacco Use  . Smoking status: Never Smoker  . Smokeless tobacco: Never Used  Vaping Use  . Vaping Use: Never used  Substance and Sexual Activity  . Alcohol use: No    Alcohol/week: 1.0 standard drink    Types: 1 Cans of beer per week    Comment: h/o heavy use, quit in 2017  . Drug use: No  . Sexual activity: Not Currently  Other Topics Concern  . Not on file  Social History Narrative  . Not on file   Social Determinants of Health   Financial Resource Strain: Low Risk   . Difficulty of Paying Living Expenses: Not hard at all  Food Insecurity: No Food Insecurity  . Worried About Charity fundraiser in the Last Year: Never true  . Ran Out of Food in the Last Year: Never true  Transportation Needs: No Transportation Needs  . Lack  of Transportation (Medical): No  . Lack of Transportation (Non-Medical): No  Physical Activity: Inactive  . Days of Exercise per Week: 0 days  . Minutes of Exercise per Session: 0 min  Stress: No Stress Concern Present  . Feeling of Stress : Not at all  Social Connections: Not on file  Intimate Partner Violence: Not on file     ROS- All systems are reviewed and negative except as per the HPI above.  Physical Exam: Vitals:   10/18/20 1055  BP: (!) 148/80  Pulse: 67  Weight: 58.8 kg  Height: 5\' 4"  (1.626 m)    GEN- The patient is a well appearing elderly male, alert and oriented x 3 today.   HEENT-head normocephalic, atraumatic, sclera clear, conjunctiva pink, hearing intact, trachea midline. Lungs- Clear to ausculation bilaterally, normal work of breathing Heart- Regular rate and rhythm, no murmurs, rubs or gallops  GI- soft, NT, ND, + BS Extremities- no clubbing, cyanosis, or edema MS- no significant deformity or atrophy Skin- no rash or lesion Psych- euthymic mood, full affect Neuro- strength and sensation are intact   Wt Readings from Last 3 Encounters:  10/18/20 58.8 kg  09/30/20  59.9 kg  09/12/20 59 kg    EKG today demonstrates  SR, RBBB, PVC, 1st degree AV block Vent. rate 67 BPM PR interval 262 ms QRS duration 136 ms QT/QTc 466/492 ms  Echo 05/20/19 demonstrated  1. Left ventricular ejection fraction, by visual estimation, is 55 to  60%. The left ventricle has normal function. Normal left ventricular size.  There is severely increased left ventricular hypertrophy.  2. Left ventricular diastolic Doppler parameters are consistent with  impaired relaxation pattern of LV diastolic filling.  3. Global right ventricle has normal systolic function.The right  ventricular size is normal. No increase in right ventricular wall  thickness.  4. Left atrial size was normal.  5. Right atrial size was normal.  6. The mitral valve is normal in structure. Mild mitral valve  regurgitation. No evidence of mitral stenosis.  7. The tricuspid valve is normal in structure. Tricuspid valve  regurgitation was not visualized by color flow Doppler.  8. The aortic valve is normal in structure. Aortic valve regurgitation is  mild by color flow Doppler. Mild to moderate aortic valve  sclerosis/calcification without any evidence of aortic stenosis.  9. The pulmonic valve was normal in structure. Pulmonic valve  regurgitation is not visualized by color flow Doppler.  10. The inferior vena cava is normal in size with greater than 50%  respiratory variability, suggesting right atrial pressure of 3 mmHg.   Epic records are reviewed at length today  CHA2DS2-VASc Score = 6  The patient's score is based upon: CHF History: No HTN History: Yes Diabetes History: No Stroke History: Yes Vascular Disease History: Yes Age Score: 2 Gender Score: 0      ASSESSMENT AND PLAN: 1. Paroxysmal Atrial Fibrillation (ICD10:  I48.0) The patient's CHA2DS2-VASc score is 6, indicating a 9.7% annual risk of stroke.   ILR shows 0% afib burden Reassured patient that his device is working and  being monitored.  Continue Eliquis 2.5 mg (age, Cr >1.5)  2. Secondary Hypercoagulable State (ICD10:  D68.69) The patient is at significant risk for stroke/thromboembolism based upon his CHA2DS2-VASc Score of 6.  Continue Apixaban (Eliquis).   3. HTN Stable, no changes today.  4. CAD Non obstructive on LHC 2011 and normal stress test 2015.   Follow up in the AF clinic in 3 months.  [facility] [address] [place], [place] [address] [phone] 10/18/2020 11:17 AM

## 2020-10-18 NOTE — Progress Notes (Signed)
[place] [phone]   [phone]) [phone]  PROGRESS NOTE  Patient Care Team: Trevor Murphy, Trevor Murphy as PCP - General (General Practice) Trevor Murphy as [place], Trevor Stapler, RN as [place] History # Iron Deficiency Anemia in the Setting of GI Bleeding 08/12/2020: patient underwent an EGD which showed two non-bleeding cratered duodenal ulcers and multiple dispersed erosions with stigmata of recent bleeding. Hgb 7.0, MCV 79 08/23/2020: establish care with Dr. Lorenso Murphy  10/18/2020: WBC 3.4, Hgb 11.5, MCV 81.6, Plt 127. Iron sat 49%, ferritin 44  Interval History:  Trevor Murphy 86 y.o. male with medical history significant for iron deficiency anemia 2/2 to GI bleeding who presents for a follow up visit. The patient's last visit was on 08/23/2020. In the interim since the last visit his Hgb has increased from 7.9 up to 11.5.   On exam today Trevor Murphy notes he has been well in the interim since her last visit.  He has been taking his iron pills faithfully and notes that he has not been having any issues with stomach upset or constipation.  He reports he has a bowel movement every single day but he is not sure it is dark or not because he does not check.  He has had no other overt sources of bleeding, bruising, or nosebleeds.  He reports that his appetite is good and he has been eating iron rich foods such as red meat.  He denies having issues with shortness of breath or low energy.  He reports no fevers, chills, sweats, nausea, vomiting or diarrhea.  A full 10 point ROS is listed below.  MEDICAL HISTORY:  Past Medical History:  Diagnosis Date  . Arthritis   . CAD (coronary artery disease)    a. Cath 08/2009: minimal nonobstructive disease (25% LAD). b. Lexiscan nuc 6/215: EF 54%, no ischemia or infarction.  . Chronic diastolic CHF (congestive heart failure) ([place])    a. Echo 2013:  EF 55-60%, mild AI, mild MR. b. Echo 5/15 with EF 60-65%, no WMA, mild LVH, mild AI.  [name] Kitchen CKD (chronic kidney disease) stage 3, GFR 30-59 ml/min (HCC) 07/26/2012   a. Felt likely due to HTN.  . CVA (cerebral infarction) 07/28/2012  . Glaucoma   . Gout   . H/O umbilical hernia repair   . Headache(784.0)    " WHEN MY BLOOD PRESSURE IS UP"  . History of acute renal failure   . History of alcohol abuse   . HTN (hypertension)   . Hypertension    a. Patent renal arteries 2005.  [name] Kitchen Noncompliance   . OA (osteoarthritis)   . Other and unspecified hyperlipidemia   . Poor circulation   . Sinus bradycardia   . SVT (supraventricular tachycardia) ([place])    a. Possible AVNRT.  Got adenosine from EMS in 2015 for SVT.  During 5/15 hospitalization had episode of short R-P tachycardia (possible AVNRT) terminated by adenosine.  b. 04/2016 - recurrent tachycardia (SVT vs. AVNRT), again terminated by Adenosine  . Visual impairment     SURGICAL HISTORY: Past Surgical History:  Procedure Laterality Date  . APPENDECTOMY    . BIOPSY  08/12/2020   Procedure: BIOPSY;  Surgeon: Trevor Craver, MD;  Location: [facility] ENDOSCOPY;  Service: Endoscopy;;  . BUBBLE STUDY  05/24/2019   Procedure: BUBBLE STUDY;  Surgeon: Trevor Heinz, MD;  Location: [facility] ENDOSCOPY;  Service: Endoscopy;;  . CARDIAC CATHETERIZATION  11/2003  EF 50-55%  . CARDIAC CATHETERIZATION  08/2009  . CATARACT EXTRACTION EXTRACAPSULAR Right 05/17/2013   Procedure: CATARACT EXTRACTION EXTRACAPSULAR WITH INTRAOCULAR LENS PLACEMENT (IOC) RIGHT EYE;  Surgeon: Trevor Pearson, MD;  Location: [place];  Service: Ophthalmology;  Laterality: Right;  . COLONOSCOPY    . ESOPHAGOGASTRODUODENOSCOPY (EGD) WITH PROPOFOL N/A 08/12/2020   Procedure: ESOPHAGOGASTRODUODENOSCOPY (EGD) WITH PROPOFOL;  Surgeon: Trevor Craver, MD;  Location: [facility] ENDOSCOPY;  Service: Endoscopy;  Laterality: N/A;  . HERNIA REPAIR    . LOOP RECORDER INSERTION N/A 05/24/2019   Procedure: LOOP  RECORDER INSERTION;  Surgeon: Trevor Haw, MD;  Location: [facility] CV LAB;  Service: Cardiovascular;  Laterality: N/A;  . TEE WITHOUT CARDIOVERSION N/A 05/24/2019   Procedure: TRANSESOPHAGEAL ECHOCARDIOGRAM (TEE);  Surgeon: Trevor Heinz, MD;  Location: [facility] ENDOSCOPY;  Service: Endoscopy;  Laterality: N/A;  . VENTRAL HERNIA REPAIR  03/07/2012   Procedure: LAPAROSCOPIC VENTRAL HERNIA;  Surgeon: Trevor Ober, MD;  Location: [place];  Service: General;  Laterality: N/A;    SOCIAL HISTORY: Social History   Socioeconomic History  . Marital status: Married    Spouse name: Not on file  . Number of children: Not on file  . Years of education: Not on file  . Highest education level: Not on file  Occupational History  . Occupation: retired  Tobacco Use  . Smoking status: Never Smoker  . Smokeless tobacco: Never Used  Vaping Use  . Vaping Use: Never used  Substance and Sexual Activity  . Alcohol use: No    Alcohol/week: 1.0 standard drink    Types: 1 Cans of beer per week    Comment: h/o heavy use, quit in 2017  . Drug use: No  . Sexual activity: Not Currently  Other Topics Concern  . Not on file  Social History Narrative  . Not on file   Social Determinants of Health   Financial Resource Strain: Low Risk   . Difficulty of Paying Living Expenses: Not hard at all  Food Insecurity: No Food Insecurity  . Worried About Charity fundraiser in the Last Year: Never true  . Ran Out of Food in the Last Year: Never true  Transportation Needs: No Transportation Needs  . Lack of Transportation (Medical): No  . Lack of Transportation (Non-Medical): No  Physical Activity: Inactive  . Days of Exercise per Week: 0 days  . Minutes of Exercise per Session: 0 min  Stress: No Stress Concern Present  . Feeling of Stress : Not at all  Social Connections: Not on file  Intimate Partner Violence: Not on file    FAMILY HISTORY: Family History  Problem Relation Age of Onset  .  CVA Mother   . Hypertension Brother   . Hypertension Sister   . Hypertension Brother   . Stroke Sister   . Heart attack Neg Hx     ALLERGIES:  has No Known Allergies.  MEDICATIONS:  Current Outpatient Medications  Medication Sig Dispense Refill  . acetaminophen (TYLENOL) 650 MG CR tablet Take 650 mg by mouth every 8 (eight) hours as needed for pain.    [name] Kitchen apixaban (ELIQUIS) 2.5 MG TABS tablet Take 1 tablet (2.5 mg total) by mouth 2 (two) times daily. 60 tablet 11  . atorvastatin (LIPITOR) 80 MG tablet Take 1 tablet (80 mg total) by mouth daily at 6 PM. 90 tablet 1  . ferrous sulfate 325 (65 FE) MG tablet Take 1 tablet (325 mg total) by mouth daily. 90 tablet  1  . LUMIGAN 0.01 % SOLN Place 1 drop into both eyes at bedtime.    . pantoprazole (PROTONIX) 40 MG tablet Take 1 tablet (40 mg total) by mouth 2 (two) times daily. 60 tablet 2   No current facility-administered medications for this visit.    REVIEW OF SYSTEMS:   Constitutional: ( - ) fevers, ( - )  chills , ( - ) night sweats Eyes: ( - ) blurriness of vision, ( - ) double vision, ( - ) watery eyes Ears, nose, mouth, throat, and face: ( - ) mucositis, ( - ) sore throat Respiratory: ( - ) cough, ( - ) dyspnea, ( - ) wheezes Cardiovascular: ( - ) palpitation, ( - ) chest discomfort, ( - ) lower extremity swelling Gastrointestinal:  ( - ) nausea, ( - ) heartburn, ( - ) change in bowel habits Skin: ( - ) abnormal skin rashes Lymphatics: ( - ) new lymphadenopathy, ( - ) easy bruising Neurological: ( - ) numbness, ( - ) tingling, ( - ) new weaknesses Behavioral/Psych: ( - ) mood change, ( - ) new changes  All other systems were reviewed with the patient and are negative.  PHYSICAL EXAMINATION:  Vitals:   10/18/20 1450  BP: 135/73  Pulse: 71  Resp: 18  Temp: 97.8 F (36.6 C)  SpO2: 97%   Filed Weights   10/18/20 1450  Weight: 128 lb (58.1 kg)    GENERAL: well appearing elderly African American male alert, no distress  and comfortable SKIN: skin color, texture, turgor are normal, no rashes or significant lesions EYES: conjunctiva are pink and non-injected, sclera clear LUNGS: clear to auscultation and percussion with normal breathing effort HEART: regular rate & rhythm and no murmurs and no lower extremity edema Musculoskeletal: no cyanosis of digits and no clubbing  PSYCH: alert & oriented x 3, fluent speech NEURO: no focal motor/sensory deficits  LABORATORY DATA:  I have reviewed the data as listed CBC Latest Ref Rng & Units 10/18/2020 10/11/2020 09/15/2020  WBC 4.0 - 10.5 K/uL 3.4(L) 3.3(L) 4.3  Hemoglobin 13.0 - 17.0 g/dL 11.5(L) 10.6(L) 10.4(L)  Hematocrit 39.0 - 52.0 % 38.9(L) 37.1(L) 35.0(L)  Platelets 150 - 400 K/uL 127(L) 108(L) 132(L)    CMP Latest Ref Rng & Units 10/18/2020 10/11/2020 09/15/2020  Glucose 70 - 99 mg/dL 120(H) 102(H) 112(H)  BUN 8 - 23 mg/dL 17 16 19   Creatinine 0.61 - 1.24 mg/dL 1.93(H) 1.66(H) 1.88(H)  Sodium 135 - 145 mmol/L 136 136 140  Potassium 3.5 - 5.1 mmol/L 4.2 3.9 4.0  Chloride 98 - 111 mmol/L 107 108 111  CO2 22 - 32 mmol/L 23 20(L) 20(L)  Calcium 8.9 - 10.3 mg/dL 8.0(L) 7.7(L) 7.5(L)  Total Protein 6.5 - 8.1 g/dL 7.9 - -  Total Bilirubin 0.3 - 1.2 mg/dL 0.6 - -  Alkaline Phos 38 - 126 U/L 105 - -  AST 15 - 41 U/L 18 - -  ALT 0 - 44 U/L 11 - -    Lab Results  Component Value Date   MPROTEIN Not Observed 08/23/2020   Lab Results  Component Value Date   KPAFRELGTCHN 61.1 (H) 08/23/2020   LAMBDASER 34.8 (H) 08/23/2020   KAPLAMBRATIO 1.76 (H) 08/23/2020     RADIOGRAPHIC STUDIES: DG Chest 2 View  Result Date: 10/11/2020 CLINICAL DATA:  Chest pain for 1 week EXAM: CHEST - 2 VIEW COMPARISON:  09/15/2020 FINDINGS: Cardiac shadow is within normal limits. Loop recorder is again noted and stable.  Aortic calcifications are seen. The lungs are well aerated bilaterally. No focal infiltrate is seen. No bony abnormality is noted. IMPRESSION: No acute abnormality seen.  Electronically Signed   By: Inez Catalina M.D.   On: 10/11/2020 11:28   CUP PACEART REMOTE DEVICE CHECK  Result Date: 09/24/2020 ILR summary report received. Battery status OK. Normal device function. No new symptom, tachy, brady, or pause episodes. No new AF episodes. Monthly summary reports and ROV/PRN. HB   ASSESSMENT & PLAN Trevor Murphy 86 y.o. male with medical history significant for iron deficiency anemia 2/2 to GI bleeding who presents for a follow up visit.  After review the labs, the records, schedule the patient the findings most consistent with iron deficiency anemia due to GI bleeding which is properly responding to p.o. iron therapy.  His hemoglobin has increased to 11.5 and he has no overt signs or symptoms of anemia at this time.  I would recommend we continue with his p.o. iron therapy and have him return in 3 months time in order to assure that his stores are adequately repleted.  # Iron Deficiency Anemia in the Setting of GI Bleeding -- findings most consistent with iron deficiency anemia in the setting of GI bleed.   --Hgb increased nicely to 11.5, recommend continue PO ferrous sulfate 325mg  PO daily.  -- no alternative sources of anemia noted on our workup.  -- May be a component of CKD contributing to the patient's anemia.  Can consider erythropoietin levels if iron deficiency does not adequately explain the extent of the patient's anemia. -- Additional nutritional labs with folic acid and vitamin B12 were WNL. -- if PO iron fails to correct his anemia my recommendation would be for IV Feraheme 510 mg q. 7 days x 2 doses -- Plan for return to clinic in 3 months time for continued monitoring.  No orders of the defined types were placed in this encounter.   All questions were answered. The patient knows to call the clinic with any problems, questions or concerns.  A total of more than 30 minutes were spent on this encounter and over half of that time was spent on  counseling and coordination of care as outlined above.   Ledell Peoples, MD Department of Hematology/Oncology [place] at [facility] Phone: [phone] Pager: [phone] Email: [email or web address]  10/21/2020 2:30 PM

## 2020-10-19 LAB — ERYTHROPOIETIN: Erythropoietin: 22.1 m[IU]/mL — ABNORMAL HIGH (ref 2.6–18.5)

## 2020-10-21 ENCOUNTER — Encounter: Payer: Self-pay | Admitting: Hematology and Oncology

## 2020-10-21 ENCOUNTER — Telehealth: Payer: Self-pay | Admitting: Hematology and Oncology

## 2020-10-21 NOTE — Telephone Encounter (Signed)
Scheduled per 3/11 los. Called and spoke with pt, confirmed 6/13 appts

## 2020-10-24 ENCOUNTER — Ambulatory Visit (INDEPENDENT_AMBULATORY_CARE_PROVIDER_SITE_OTHER): Payer: Medicare Other

## 2020-10-24 ENCOUNTER — Ambulatory Visit: Payer: Self-pay

## 2020-10-24 ENCOUNTER — Telehealth: Payer: Medicare Other

## 2020-10-24 DIAGNOSIS — D509 Iron deficiency anemia, unspecified: Secondary | ICD-10-CM

## 2020-10-24 DIAGNOSIS — I10 Essential (primary) hypertension: Secondary | ICD-10-CM | POA: Diagnosis not present

## 2020-10-24 DIAGNOSIS — I480 Paroxysmal atrial fibrillation: Secondary | ICD-10-CM | POA: Diagnosis not present

## 2020-10-24 DIAGNOSIS — I639 Cerebral infarction, unspecified: Secondary | ICD-10-CM | POA: Diagnosis not present

## 2020-10-24 DIAGNOSIS — Z741 Need for assistance with personal care: Secondary | ICD-10-CM

## 2020-10-24 DIAGNOSIS — N183 Chronic kidney disease, stage 3 unspecified: Secondary | ICD-10-CM

## 2020-10-24 DIAGNOSIS — R7303 Prediabetes: Secondary | ICD-10-CM

## 2020-10-24 NOTE — Chronic Care Management (AMB) (Addendum)
Chronic Care Management   CCM RN Visit Note  10/24/2020 Name: Trevor Murphy MRN: [id number] DOB: February 16, 1935  Subjective: Trevor Murphy is a 86 y.o. year old male who is a primary care patient of [name], [place]. The care management team was consulted for assistance with disease management and care coordination needs.    Collaboration with embedded BSW [name]  for Case Collaboration  in response to provider referral for case management and/or care coordination services.   Consent to Services:  The patient was given the following information about Chronic Care Management services today, agreed to services, and gave verbal consent: 1. CCM service includes personalized support from designated clinical staff supervised by the primary care provider, including individualized plan of care and coordination with other care providers 2. 24/7 contact phone numbers for assistance for urgent and routine care needs. 3. Service will only be billed when office clinical staff spend 20 minutes or more in a month to coordinate care. 4. Only one practitioner may furnish and bill the service in a calendar month. 5.The patient may stop CCM services at any time (effective at the end of the month) by phone call to the office staff. 6. The patient will be responsible for cost sharing (co-pay) of up to 20% of the service fee (after annual deductible is met). Patient agreed to services and consent obtained.  Patient agreed to services and verbal consent obtained.   Assessment: Review of patient past medical history, allergies, medications, health status, including review of consultants reports, laboratory and other test data, was performed as part of comprehensive evaluation and provision of chronic care management services.   SDOH (Social Determinants of Health) assessments and interventions performed:    CCM Care Plan  No Known Allergies  Outpatient Encounter Medications as of 10/24/2020  Medication Sig  .  acetaminophen (TYLENOL) 650 MG CR tablet Take 650 mg by mouth every 8 (eight) hours as needed for pain.  [name] Kitchen apixaban (ELIQUIS) 2.5 MG TABS tablet Take 1 tablet (2.5 mg total) by mouth 2 (two) times daily.  [name] Kitchen atorvastatin (LIPITOR) 80 MG tablet Take 1 tablet (80 mg total) by mouth daily at 6 PM.  . ferrous sulfate 325 (65 FE) MG tablet Take 1 tablet (325 mg total) by mouth daily.  [name] Kitchen LUMIGAN 0.01 % SOLN Place 1 drop into both eyes at bedtime.  . pantoprazole (PROTONIX) 40 MG tablet Take 1 tablet (40 mg total) by mouth 2 (two) times daily.   No facility-administered encounter medications on file as of 10/24/2020.    Patient Active Problem List   Diagnosis Date Noted  . Mixed hyperlipidemia 08/12/2020  . Acute blood loss anemia 08/11/2020  . Paroxysmal atrial fibrillation ([place]) 06/07/2020  . Secondary hypercoagulable state ([place]) 06/07/2020  . Ischemic cerebrovascular accident (CVA) of frontal lobe ([place]) 05/20/2019  . Pain due to onychomycosis of toenails of both feet 01/20/2019  . Prediabetes 07/22/2018  . Nail abnormality 07/22/2018  . Sinus bradycardia 05/02/2016  . Hypotension due to drugs 05/02/2016  . History of tachycardia 05/02/2016  . GERD (gastroesophageal reflux disease) 02/05/2015  . Coronary artery disease involving native coronary artery of native heart without angina pectoris 04/20/2014  . Atypical chest pain 04/12/2014  . Hypertensive urgency 03/11/2014  . Chronic diastolic CHF (congestive heart failure) ([place]) 01/30/2014  . Alcohol abuse 01/19/2014  . Elevated troponin level not due myocardial infarction 12/19/2013  . Gout 12/19/2013  . Dyslipidemia 12/16/2013  . Hypertensive crisis 12/16/2013  . Cerebral infarction (  [place]) 07/28/2012  . Acute on chronic renal failure ([place]) 07/27/2012  . Essential hypertension 07/26/2012  . Chronic kidney disease, stage 3b ([place]) 07/26/2012  . Poor circulation   . Recurrent ventral hernia 02/16/2012  . Hernia (acquired) (recurrent)  02/10/2011    Conditions to be addressed/monitored:Paroxysmal atrial fibrillation, Iron deficiency anemia, Essential Hypertension, Prediabetes   Care Plan : Assist with Chronic Care Management and Care Coordination needs  Updates made by Lynne Logan, RN since 10/24/2020 12:00 AM    Problem: Assist with Chronic Care Management and Care Coordination needs   Priority: High    Goal: Assist with Chronic Care Management and Care Coordination needs   Start Date: 10/24/2020  Expected End Date: 12/24/2020  This Visit's Progress: On track  Priority: High  Note:   Current Barriers:  [name] Kitchen Knowledge Barriers related to resources and support available to address needs related to Chronic Care management and Care Coordination needs for Paroxysmal atrial fibrillation, Iron deficiency anemia, Essential Hypertension, Prediabetes  Case Manager Clinical Goal(s):  [name] Kitchen Over the next 60 days, patient will work with CCM RN CM to address needs related to Chronic Care management and Care Coordination needs for  Interventions:  . Collaborated with  embedded BSW [name]  to establish an individualized plan of care  Patient Self Care Activities:  . Patient will work with the embedded CCM team to address Chronic Care management and Care Coordination needs for Paroxysmal atrial fibrillation, Iron deficiency anemia, Essential Hypertension, Prediabetes   Initial CCM RN CM call scheduled for:  11/29/20    Plan:Telephone follow up appointment with care management team member scheduled for:  11/29/20  Barb Merino, RN, BSN, CCM Care Management Coordinator [place] [facility]  Direct Phone: [phone]

## 2020-10-24 NOTE — Chronic Care Management (AMB) (Signed)
Chronic Care Management    Social Work Note  10/24/2020 Name: Trevor Murphy MRN: [id number] DOB: 02-18-1935  Trevor Murphy is a 86 y.o. year old male who is a primary care patient of [name], [place]. The CCM team was consulted to assist the patient with chronic disease management and/or care coordination needs related to: Level of Care Concerns.   Engaged with patients [name] by phone for initial visit in response to provider referral for social work chronic care management and care coordination services.   Consent to Services:  The patient was given information about Chronic Care Management services, agreed to services, and gave verbal consent prior to initiation of services.  Please see initial visit note for detailed documentation.   Patient agreed to services and consent obtained.   Assessment: Review of patient past medical history, allergies, medications, and health status, including review of relevant consultants reports was performed today as part of a comprehensive evaluation and provision of chronic care management and care coordination services.     SDOH (Social Determinants of Health) assessments and interventions performed:  SDOH Interventions   Flowsheet Row Most Recent Value  SDOH Interventions   Food Insecurity Interventions Intervention Not Indicated  Housing Interventions Intervention Not Indicated  Transportation Interventions Intervention Not Indicated       Advanced Directives Status: Not addressed in this encounter.  CCM Care Plan  No Known Allergies  Outpatient Encounter Medications as of 10/24/2020  Medication Sig  . acetaminophen (TYLENOL) 650 MG CR tablet Take 650 mg by mouth every 8 (eight) hours as needed for pain.  [name] Kitchen apixaban (ELIQUIS) 2.5 MG TABS tablet Take 1 tablet (2.5 mg total) by mouth 2 (two) times daily.  [name] Kitchen atorvastatin (LIPITOR) 80 MG tablet Take 1 tablet (80 mg total) by mouth daily at 6 PM.  . ferrous sulfate 325 (65 FE) MG  tablet Take 1 tablet (325 mg total) by mouth daily.  [name] Kitchen LUMIGAN 0.01 % SOLN Place 1 drop into both eyes at bedtime.  . pantoprazole (PROTONIX) 40 MG tablet Take 1 tablet (40 mg total) by mouth 2 (two) times daily.   No facility-administered encounter medications on file as of 10/24/2020.    Patient Active Problem List   Diagnosis Date Noted  . Mixed hyperlipidemia 08/12/2020  . Acute blood loss anemia 08/11/2020  . Paroxysmal atrial fibrillation ([place]) 06/07/2020  . Secondary hypercoagulable state ([place]) 06/07/2020  . Ischemic cerebrovascular accident (CVA) of frontal lobe ([place]) 05/20/2019  . Pain due to onychomycosis of toenails of both feet 01/20/2019  . Prediabetes 07/22/2018  . Nail abnormality 07/22/2018  . Sinus bradycardia 05/02/2016  . Hypotension due to drugs 05/02/2016  . History of tachycardia 05/02/2016  . GERD (gastroesophageal reflux disease) 02/05/2015  . Coronary artery disease involving native coronary artery of native heart without angina pectoris 04/20/2014  . Atypical chest pain 04/12/2014  . Hypertensive urgency 03/11/2014  . Chronic diastolic CHF (congestive heart failure) ([place]) 01/30/2014  . Alcohol abuse 01/19/2014  . Elevated troponin level not due myocardial infarction 12/19/2013  . Gout 12/19/2013  . Dyslipidemia 12/16/2013  . Hypertensive crisis 12/16/2013  . Cerebral infarction ([place]) 07/28/2012  . Acute on chronic renal failure ([place]) 07/27/2012  . Essential hypertension 07/26/2012  . Chronic kidney disease, stage 3b ([place] ) 07/26/2012  . Poor circulation   . Recurrent ventral hernia 02/16/2012  . Hernia (acquired) (recurrent) 02/10/2011    Conditions to be addressed/monitored: CHF, HTN and CKD Stage III; Limited access to caregiver  Care Plan : Social Work [place]  Updates made by [name] since 10/24/2020 12:00 AM    Problem: Mobility and Independence     Goal: Mobility and Independence Optimized   Start Date: 10/24/2020  Expected End  Date: 12/08/2020  This Visit's Progress: On track  Priority: High  Note:   Current Barriers:  . Chronic disease management support and education needs related to CHF, HTN, and CKD Stage III   . Limited access to caregiver  Social Worker Clinical Goal(s):  [name] Kitchen Over the next 45 days, patient will work with SW to identify caregiver resources  CCM SW Interventions:  . Inter-disciplinary care team collaboration (see longitudinal plan of care) . Collaboration with [name], FNP regarding development and update of comprehensive plan of care as evidenced by provider attestation and co-signature . Successful outbound call placed to the patients sister, Trevor Murphy to complete visit . Discussed the patient lives alone in a senior apartment and is in need of a caregiver to assist with daily care needs . Performed chart review to note during 2.21.22 office visit the patients provider planned to submit a PCS application . Assessed for outcome of application - Trevor Murphy reports she did receive a call from [facility] asking the patient identify a PCS provider . Advised Trevor Murphy the importance of returning this call in order for the patient to begin receiving services - Trevor Murphy stated understanding and reported she would help the patient call . Performed [facility] screen - no acute needs identified at this time. Patient lives in an apartment for seniors and his sister assists with transportation . Discussed plans for SW to follow up over the next 21 days to assess outcome of PCS application . Collaboration with [place] regarding interventions and plan . Scheduled follow up call over the next 21 days  Patient Goals/Self-Care Activities . Over the next 21 days, patient will: With the help of his sister  - Patient will self administer medications as prescribed Patient will attend all scheduled provider appointments Patient will call provider office for new concerns or questions [place] to identify a PCS agency Contact SW as needed prior to next scheduled call  Follow Up Plan:  SW will follow up with the patient over the next 21 days       Follow Up Plan: SW will follow up with patient by phone over the next 21 days.      [name], BSW, CDP Social Worker, Certified Dementia Practitioner [place] [facility] [place] [facility] [phone]  Total time spent performing care coordination and/or care management activities with the patient by phone or face to face = 26 minutes.

## 2020-10-24 NOTE — Patient Instructions (Signed)
Social Worker Visit Information  Goals we discussed today:  Goals Addressed            This Visit's Progress   . Mobility and Independence Optimized       Timeframe:  Short-Term Goal Priority:  High Start Date: 3.17.22                            Expected End Date: 5.1.22                       Next planned outreach: 4.5.22  Patient Goals/Self-Care Activities . Over the next 21 days, patient will: With the help of his sister  - Patient will self administer medications as prescribed Patient will attend all scheduled provider appointments Patient will call provider office for new concerns or questions [place] to identify a PCS agency Contact SW as needed prior to next scheduled call        Materials provided: Verbal education about PCS program provided by phone  Mr. Yanke was given information about Chronic Care Management services today including:  1. CCM service includes personalized support from designated clinical staff supervised by his physician, including individualized plan of care and coordination with other care providers 2. 24/7 contact phone numbers for assistance for urgent and routine care needs. 3. Service will only be billed when office clinical staff spend 20 minutes or more in a month to coordinate care. 4. Only one practitioner may furnish and bill the service in a calendar month. 5. The patient may stop CCM services at any time (effective at the end of the month) by phone call to the office staff. 6. The patient will be responsible for cost sharing (co-pay) of up to 20% of the service fee (after annual deductible is met).  Patient agreed to services and verbal consent obtained.   The patient verbalized understanding of instructions, educational materials, and care plan provided today and declined offer to receive copy of patient instructions, educational materials, and care plan.   Follow up plan: SW will follow up with patient by phone  over the next 21 days.  [name], BSW, CDP Social Worker, Certified Dementia Practitioner [place] [facility] [place] [facility] [phone]

## 2020-10-28 ENCOUNTER — Encounter: Payer: Self-pay | Admitting: Nurse Practitioner

## 2020-10-28 ENCOUNTER — Telehealth: Payer: Self-pay

## 2020-10-28 LAB — CUP PACEART REMOTE DEVICE CHECK
Date Time Interrogation Session: 20220319230205
Implantable Pulse Generator Implant Date: 20201014

## 2020-10-28 NOTE — Telephone Encounter (Signed)
ILR summary report received showing Brady episodes.  Pt does have history of ncturnal Loletha Grayer, however som of these episodes do not appear during Nocturnal hours.  Attempted to reach pt to assess symptoms.  No answer, LVM requesting pt callback.  Device clinic # and hours provided.

## 2020-11-01 NOTE — Telephone Encounter (Signed)
Spoke to patients sister [name] in regards to brady episodes. Sister states he has not had of any complaints during that time or now. If the patient does complain of any further she or he will give [place] a call. Advised I will forward to Dr. Curt Bears and we will call if any changes are warranted.

## 2020-11-01 NOTE — Progress Notes (Signed)
Carelink Summary Report / Loop Recorder 

## 2020-11-11 ENCOUNTER — Other Ambulatory Visit: Payer: Self-pay | Admitting: Nurse Practitioner

## 2020-11-12 ENCOUNTER — Telehealth: Payer: Medicare Other

## 2020-11-12 ENCOUNTER — Telehealth: Payer: Self-pay

## 2020-11-12 NOTE — Telephone Encounter (Signed)
  Chronic Care Management   Outreach Note  11/12/2020 Name: Trevor Murphy MRN: [id number] DOB: [birth year]  Referred by: [name], FNP Reason for referral : Chronic Care Management   An unsuccessful telephone outreach was attempted today. The patient was referred to the case management team for assistance with care management and care coordination.   Follow Up Plan: A HIPAA compliant phone message was left for the patient providing contact information and requesting a return call.  The care management team will reach out to the patient again over the next 21 days.   [name], BSW, CDP Social Worker, Certified Dementia Practitioner [place] [facility] [place] [facility] [phone]

## 2020-11-13 ENCOUNTER — Other Ambulatory Visit: Payer: Self-pay | Admitting: Nurse Practitioner

## 2020-11-14 ENCOUNTER — Other Ambulatory Visit: Payer: Self-pay | Admitting: Nurse Practitioner

## 2020-11-14 ENCOUNTER — Other Ambulatory Visit (HOSPITAL_COMMUNITY): Payer: Self-pay | Admitting: Physician Assistant

## 2020-11-14 DIAGNOSIS — D509 Iron deficiency anemia, unspecified: Secondary | ICD-10-CM

## 2020-11-15 ENCOUNTER — Other Ambulatory Visit: Payer: Self-pay | Admitting: Nurse Practitioner

## 2020-11-25 ENCOUNTER — Ambulatory Visit (INDEPENDENT_AMBULATORY_CARE_PROVIDER_SITE_OTHER): Payer: Medicare Other

## 2020-11-25 DIAGNOSIS — I639 Cerebral infarction, unspecified: Secondary | ICD-10-CM | POA: Diagnosis not present

## 2020-11-26 ENCOUNTER — Ambulatory Visit: Payer: Medicare Other

## 2020-11-26 DIAGNOSIS — Z741 Need for assistance with personal care: Secondary | ICD-10-CM

## 2020-11-26 DIAGNOSIS — I10 Essential (primary) hypertension: Secondary | ICD-10-CM

## 2020-11-26 DIAGNOSIS — N183 Chronic kidney disease, stage 3 unspecified: Secondary | ICD-10-CM

## 2020-11-26 NOTE — Chronic Care Management (AMB) (Signed)
Chronic Care Management    Social Work Note  11/26/2020 Name: Trevor Murphy MRN: [id number] DOB: 13-Sep-1934  Trevor Murphy is a 86 y.o. year old male who is a primary care patient of [name], [place]. The CCM team was consulted to assist the patient with chronic disease management and/or care coordination needs related to: Level of Care Concerns.   Engaged with patients sister by phone for follow up visit in response to provider referral for social work chronic care management and care coordination services.   Consent to Services:  The patient was given information about Chronic Care Management services, agreed to services, and gave verbal consent prior to initiation of services.  Please see initial visit note for detailed documentation.   Patient agreed to services and consent obtained.   Assessment: Review of patient past medical history, allergies, medications, and health status, including review of relevant consultants reports was performed today as part of a comprehensive evaluation and provision of chronic care management and care coordination services.     SDOH (Social Determinants of Health) assessments and interventions performed:    Advanced Directives Status: Not addressed in this encounter.  CCM Care Plan  No Known Allergies  Outpatient Encounter Medications as of 11/26/2020  Medication Sig  . acetaminophen (TYLENOL) 650 MG CR tablet Take 650 mg by mouth every 8 (eight) hours as needed for pain.  [name] Kitchen atorvastatin (LIPITOR) 80 MG tablet TAKE 1 TABLET(80 MG) BY MOUTH DAILY AT 6 PM  . ELIQUIS 2.5 MG TABS tablet TAKE 1 TABLET(2.5 MG) BY MOUTH TWICE DAILY  . FEROSUL 325 (65 Fe) MG tablet TAKE 1 TABLET(325 MG) BY MOUTH DAILY  . LUMIGAN 0.01 % SOLN Place 1 drop into both eyes at bedtime.  . pantoprazole (PROTONIX) 40 MG tablet Take 1 tablet (40 mg total) by mouth 2 (two) times daily.   No facility-administered encounter medications on file as of 11/26/2020.    Patient Active  Problem List   Diagnosis Date Noted  . Mixed hyperlipidemia 08/12/2020  . Acute blood loss anemia 08/11/2020  . Paroxysmal atrial fibrillation ([place]) 06/07/2020  . Secondary hypercoagulable state ([place]) 06/07/2020  . Ischemic cerebrovascular accident (CVA) of frontal lobe ([place]) 05/20/2019  . Pain due to onychomycosis of toenails of both feet 01/20/2019  . Prediabetes 07/22/2018  . Nail abnormality 07/22/2018  . Sinus bradycardia 05/02/2016  . Hypotension due to drugs 05/02/2016  . History of tachycardia 05/02/2016  . GERD (gastroesophageal reflux disease) 02/05/2015  . Coronary artery disease involving native coronary artery of native heart without angina pectoris 04/20/2014  . Atypical chest pain 04/12/2014  . Hypertensive urgency 03/11/2014  . Chronic diastolic CHF (congestive heart failure) ([place]) 01/30/2014  . Alcohol abuse 01/19/2014  . Elevated troponin level not due myocardial infarction 12/19/2013  . Gout 12/19/2013  . Dyslipidemia 12/16/2013  . Hypertensive crisis 12/16/2013  . Cerebral infarction ([place]) 07/28/2012  . Acute on chronic renal failure ([place]) 07/27/2012  . Essential hypertension 07/26/2012  . Chronic kidney disease, stage 3b ([place]) 07/26/2012  . Poor circulation   . Recurrent ventral hernia 02/16/2012  . Hernia (acquired) (recurrent) 02/10/2011    Conditions to be addressed/monitored: CHF, HTN and CKD Stage III; Limited access to caregiver  Care Plan : Social Work [facility] Care Plan  Updates made by [name] since 11/26/2020 12:00 AM    Problem: Mobility and Independence     Goal: Mobility and Independence Optimized   Start Date: 10/24/2020  Expected End Date: 12/08/2020  Recent  Progress: On track  Priority: High  Note:   Current Barriers:  . Chronic disease management support and education needs related to CHF, HTN, and CKD Stage III   . Limited access to caregiver  Social Worker Clinical Goal(s):  [name] Kitchen Over the next 45 days, patient will work with SW to  identify caregiver resources  CCM SW Interventions:  . Inter-disciplinary care team collaboration (see longitudinal plan of care) . Collaboration with [name], FNP regarding development and update of comprehensive plan of care as evidenced by provider attestation and co-signature . Successful outbound call placed to the patients sister, Trevor Murphy to assess goal progression . Discussed she is working with the CAP program to become a paid caregiver . Determined [name] has obtained paperwork and will plan to drop off for the patients provider to complete . Scheduled follow up call over the next 30 days  Patient Goals/Self-Care Activities . Over the next 21 days, patient will: With the help of his sister  - Patient will self administer medications as prescribed Patient will attend all scheduled provider appointments Patient will call provider office for new concerns or questions Submit CAP Application Contact SW as needed prior to next scheduled call  Follow Up Plan:  SW will follow up with the patient over the next 30 days       Follow Up Plan: SW will follow up with patient by phone over the next month      [name], BSW, CDP Social Worker, Certified Dementia Practitioner [place] [facility] [place] [facility] [phone]  Total time spent performing care coordination and/or care management activities with the patient by phone or face to face = 15 minutes.

## 2020-11-26 NOTE — Patient Instructions (Signed)
Social Worker Visit Information  Goals we discussed today:  Goals Addressed            This Visit's Progress   . Mobility and Independence Optimized       Timeframe:  Short-Term Goal Priority:  High Start Date: 3.17.22                            Expected End Date: 5.1.22                       Next planned outreach: 5.17.22  Patient Goals/Self-Care Activities . Over the next 21 days, patient will: With the help of his sister  - Patient will self administer medications as prescribed Patient will attend all scheduled provider appointments Patient will call provider office for new concerns or questions Submit CAP Application Contact SW as needed prior to next scheduled call       Follow Up Plan: SW will follow up with patient by phone over the next month   [name], BSW, CDP Social Worker, Certified Dementia Practitioner [place] [facility] [place] [facility] [phone]

## 2020-11-29 ENCOUNTER — Telehealth: Payer: Self-pay

## 2020-11-29 ENCOUNTER — Ambulatory Visit (INDEPENDENT_AMBULATORY_CARE_PROVIDER_SITE_OTHER): Payer: Medicare Other

## 2020-11-29 ENCOUNTER — Telehealth: Payer: Medicare Other

## 2020-11-29 DIAGNOSIS — N183 Chronic kidney disease, stage 3 unspecified: Secondary | ICD-10-CM

## 2020-11-29 DIAGNOSIS — R7303 Prediabetes: Secondary | ICD-10-CM

## 2020-11-29 DIAGNOSIS — D509 Iron deficiency anemia, unspecified: Secondary | ICD-10-CM

## 2020-11-29 DIAGNOSIS — I10 Essential (primary) hypertension: Secondary | ICD-10-CM

## 2020-11-29 DIAGNOSIS — I480 Paroxysmal atrial fibrillation: Secondary | ICD-10-CM

## 2020-11-29 LAB — CUP PACEART REMOTE DEVICE CHECK
Date Time Interrogation Session: 20220421230344
Implantable Pulse Generator Implant Date: 20201014

## 2020-11-29 NOTE — Telephone Encounter (Signed)
Unsuccessful telephone encounter to [place]. [place] to assess for symptomatic bradycardia after carelink alert received today.  "ILR summary report received. Battery status OK. Normal device function. No new symptom, or pause episodes. No new AF episodes.  11 brady dyatime episodes all 11/28/20 (10:41-12:50). Longest 14 min 51 sec. rates <30"  Hipaa compliant VM message left at given number requesting call back to [phone].

## 2020-12-04 ENCOUNTER — Ambulatory Visit (INDEPENDENT_AMBULATORY_CARE_PROVIDER_SITE_OTHER): Payer: Medicare Other | Admitting: Podiatry

## 2020-12-04 ENCOUNTER — Encounter: Payer: Self-pay | Admitting: Podiatry

## 2020-12-04 ENCOUNTER — Other Ambulatory Visit: Payer: Self-pay

## 2020-12-04 DIAGNOSIS — M79674 Pain in right toe(s): Secondary | ICD-10-CM

## 2020-12-04 DIAGNOSIS — B351 Tinea unguium: Secondary | ICD-10-CM | POA: Diagnosis not present

## 2020-12-04 DIAGNOSIS — I739 Peripheral vascular disease, unspecified: Secondary | ICD-10-CM

## 2020-12-04 DIAGNOSIS — M79675 Pain in left toe(s): Secondary | ICD-10-CM | POA: Diagnosis not present

## 2020-12-04 DIAGNOSIS — N183 Chronic kidney disease, stage 3 unspecified: Secondary | ICD-10-CM

## 2020-12-04 NOTE — Progress Notes (Signed)
This patient returns to my office for at risk foot care.  This patient requires this care by a professional since this patient will be at risk due to having chronic kidney disease and coagulation defect.  Patient is taking plavix. Patient presents to the office with hisnfemale caregiver.  This patient is unable to cut nails himself since the patient cannot reach his nails.These nails are painful walking and wearing shoes.  This patient presents for at risk foot care today.  General Appearance  Alert, conversant and in no acute stress.  Vascular  Dorsalis pedis and posterior tibial  pulses are weakly  palpable  bilaterally.  Capillary return is within normal limits  Bilaterally. Cold feet bilaterally. Absent digital hair  B/L.  Neurologic  Senn-Weinstein monofilament wire test within normal limits  bilaterally. Muscle power within normal limits bilaterally.  Nails Thick disfigured discolored nails with subungual debris  from hallux to fifth toes bilaterally. No evidence of bacterial infection or drainage bilaterally.  Orthopedic  No limitations of motion  feet .  No crepitus or effusions noted.  No bony pathology or digital deformities noted.  HAV  B/L.  Skin  normotropic skin with no porokeratosis noted bilaterally.  No signs of infections or ulcers noted.     Onychomycosis  Pain in right toes  Pain in left toes  Consent was obtained for treatment procedures.   Mechanical debridement of nails 1-5  bilaterally performed with a nail nipper.  Filed with dremel without incident.    Return office visit     10  weeks                Told patient to return for periodic foot care and evaluation due to potential at risk complications.   Gardiner Barefoot DPM

## 2020-12-09 ENCOUNTER — Other Ambulatory Visit: Payer: Self-pay

## 2020-12-09 ENCOUNTER — Ambulatory Visit (INDEPENDENT_AMBULATORY_CARE_PROVIDER_SITE_OTHER): Payer: Medicare Other | Admitting: Nurse Practitioner

## 2020-12-09 ENCOUNTER — Encounter: Payer: Self-pay | Admitting: Nurse Practitioner

## 2020-12-09 VITALS — BP 144/70 | HR 64 | Temp 98.0°F | Ht 65.0 in | Wt 131.8 lb

## 2020-12-09 DIAGNOSIS — I480 Paroxysmal atrial fibrillation: Secondary | ICD-10-CM | POA: Diagnosis not present

## 2020-12-09 DIAGNOSIS — Z9989 Dependence on other enabling machines and devices: Secondary | ICD-10-CM | POA: Diagnosis not present

## 2020-12-09 DIAGNOSIS — H5450 Low vision, one eye, unspecified eye: Secondary | ICD-10-CM

## 2020-12-09 DIAGNOSIS — D509 Iron deficiency anemia, unspecified: Secondary | ICD-10-CM

## 2020-12-09 DIAGNOSIS — I10 Essential (primary) hypertension: Secondary | ICD-10-CM | POA: Diagnosis not present

## 2020-12-09 DIAGNOSIS — I129 Hypertensive chronic kidney disease with stage 1 through stage 4 chronic kidney disease, or unspecified chronic kidney disease: Secondary | ICD-10-CM

## 2020-12-09 DIAGNOSIS — Z2821 Immunization not carried out because of patient refusal: Secondary | ICD-10-CM

## 2020-12-09 DIAGNOSIS — H5440 Blindness, one eye, unspecified eye: Secondary | ICD-10-CM | POA: Diagnosis not present

## 2020-12-09 DIAGNOSIS — N183 Chronic kidney disease, stage 3 unspecified: Secondary | ICD-10-CM

## 2020-12-09 DIAGNOSIS — R7303 Prediabetes: Secondary | ICD-10-CM

## 2020-12-09 NOTE — Progress Notes (Signed)
This visit occurred during the SARS-CoV-2 public health emergency.  Safety protocols were in place, including screening questions prior to the visit, additional usage of staff PPE, and extensive cleaning of exam room while observing appropriate contact time as indicated for disinfecting solutions.  Subjective:     Patient ID: Trevor Murphy , male    DOB: 08-11-1934 , 86 y.o.   MRN: [id number]   Chief Complaint  Patient presents with  . Hypertension    HPI  Trevor Murphy presents for a hypertension follow-up.  His blood pressure today is 144/70. His Cardiologist took him off his blood pressure medications.  Pertinent negatives include chest pain, headaches, changes in vision, dizziness or increased shortness of breath.  He does not check his blood pressure at home.    Trevor Murphy denies any changes to health since his last visit and does not have any concerns.  He was started on [place] and reports more energy.  He denies constipation or dark stools. He declines the PCV-13.  His sister will schedule an opthalmology exam.      Past Medical History:  Diagnosis Date  . Arthritis   . CAD (coronary artery disease)    a. Cath 08/2009: minimal nonobstructive disease (25% LAD). b. Lexiscan nuc 6/215: EF 54%, no ischemia or infarction.  . Chronic diastolic CHF (congestive heart failure) ([place])    a. Echo 2013: EF 55-60%, mild AI, mild MR. b. Echo 5/15 with EF 60-65%, no WMA, mild LVH, mild AI.  [name] Kitchen CKD (chronic kidney disease) stage 3, GFR 30-59 ml/min (HCC) 07/26/2012   a. Felt likely due to HTN.  . CVA (cerebral infarction) 07/28/2012  . Glaucoma   . Gout   . H/O umbilical hernia repair   . Headache(784.0)    " WHEN MY BLOOD PRESSURE IS UP"  . History of acute renal failure   . History of alcohol abuse   . HTN (hypertension)   . Hypertension    a. Patent renal arteries 2005.  [name] Kitchen Noncompliance   . OA (osteoarthritis)   . Other and unspecified hyperlipidemia   . Poor circulation   . Sinus  bradycardia   . SVT (supraventricular tachycardia) ([place])    a. Possible AVNRT.  Got adenosine from EMS in 2015 for SVT.  During 5/15 hospitalization had episode of short R-P tachycardia (possible AVNRT) terminated by adenosine.  b. 04/2016 - recurrent tachycardia (SVT vs. AVNRT), again terminated by Adenosine  . Visual impairment      Family History  Problem Relation Age of Onset  . CVA Mother   . Hypertension Brother   . Hypertension Sister   . Hypertension Brother   . Stroke Sister   . Heart attack Neg Hx      Current Outpatient Medications:  .  acetaminophen (TYLENOL) 650 MG CR tablet, Take 650 mg by mouth every 8 (eight) hours as needed for pain., Disp: , Rfl:  .  atorvastatin (LIPITOR) 80 MG tablet, TAKE 1 TABLET(80 MG) BY MOUTH DAILY AT 6 PM, Disp: 90 tablet, Rfl: 1 .  ELIQUIS 2.5 MG TABS tablet, TAKE 1 TABLET(2.5 MG) BY MOUTH TWICE DAILY, Disp: 60 tablet, Rfl: 4 .  FEROSUL 325 (65 Fe) MG tablet, TAKE 1 TABLET(325 MG) BY MOUTH DAILY, Disp: 90 tablet, Rfl: 1 .  LUMIGAN 0.01 % SOLN, Place 1 drop into both eyes at bedtime., Disp: , Rfl:  .  pantoprazole (PROTONIX) 40 MG tablet, Take 1 tablet (40 mg total) by mouth 2 (two) times daily.,  Disp: 60 tablet, Rfl: 2   No Known Allergies   Review of Systems  Constitutional: Negative.  Negative for fatigue.  Eyes:       Legally blind left eye, only sees shadows with right eye  Respiratory: Negative.   Cardiovascular: Negative.   Neurological: Negative.   Psychiatric/Behavioral: Negative.      Today's Vitals   12/09/20 1137  BP: (!) 144/70  Pulse: 64  Temp: 98 F (36.7 C)  TempSrc: Oral  Weight: 131 lb 12.8 oz (59.8 kg)  Height: 5\' 5"  (1.651 m)  PainSc: 0-No pain   Body mass index is 21.93 kg/m.   Objective:  Physical Exam Constitutional:      General: He is not in acute distress.    Appearance: Normal appearance.  Cardiovascular:     Rate and Rhythm: Normal rate and regular rhythm.     Pulses: Normal pulses.      Heart sounds: No murmur heard.   Pulmonary:     Effort: Pulmonary effort is normal. No respiratory distress.     Breath sounds: Normal breath sounds. No wheezing.  Musculoskeletal:        General: Normal range of motion.     Right lower leg: No edema.     Left lower leg: No edema.     Comments: Using cane to ambulate  Skin:    General: Skin is warm and dry.     Capillary Refill: Capillary refill takes less than 2 seconds.  Neurological:     General: No focal deficit present.     Mental Status: He is alert and oriented to person, place, and time.     Cranial Nerves: No cranial nerve deficit.         Assessment And Plan:     1. Benign hypertension with chronic kidney disease, stage III . B/P is fairly controlled.  . CMP ordered to check renal function.  . The importance of regular exercise and dietary modification was stressed to the patient.   2. Prediabetes  Chronic, stable  No current medications  Encouraged to limit intake of sugary foods and drinks  Encouraged to increase physical activity to 150 minutes per week by doing chair exercises  4. Paroxysmal atrial fibrillation (HCC)  Chronic, stable  Continue follow up with Cardiology  5. Iron deficiency anemia, unspecified iron deficiency anemia type  Chronic, continue follow up with Hematology  Continue with your iron supplement  6. 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 Patient verbalized understanding of the plan and was able to repeat key elements of the plan. All questions were answered to their satisfaction.  Trevor Brine, FNP   I, Trevor Brine, FNP, have reviewed all documentation for this visit. The documentation on 12/09/20 for the exam, diagnosis, procedures, and orders are all accurate and complete.   IF YOU HAVE BEEN REFERRED TO A SPECIALIST, IT MAY TAKE 1-2 WEEKS TO  SCHEDULE/PROCESS THE REFERRAL. IF YOU HAVE NOT HEARD FROM US/SPECIALIST IN TWO WEEKS, PLEASE GIVE [place] A CALL AT [phone].   THE PATIENT IS ENCOURAGED TO PRACTICE SOCIAL DISTANCING DUE TO THE COVID-19 PANDEMIC.

## 2020-12-09 NOTE — Progress Notes (Deleted)
I,Tyric Rodeheaver [employer] as a Education administrator for [employer], FNP.,have documented all relevant documentation on the behalf of [name], FNP,as directed by  [name], FNP while in the presence of [name], [place]. This visit occurred during the SARS-CoV-2 public health emergency.  Safety protocols were in place, including screening questions prior to the visit, additional usage of staff PPE, and extensive cleaning of exam room while observing appropriate contact time as indicated for disinfecting solutions.  Subjective:     Patient ID: Trevor Murphy , male    DOB: 02-Jul-1935 , 86 y.o.   MRN: [id number]   Chief Complaint  Patient presents with  . Hypertension    HPI  Patient presents today for a blood pressure f/u  Hypertension     Past Medical History:  Diagnosis Date  . Arthritis   . CAD (coronary artery disease)    a. Cath 08/2009: minimal nonobstructive disease (25% LAD). b. Lexiscan nuc 6/215: EF 54%, no ischemia or infarction.  . Chronic diastolic CHF (congestive heart failure) ([place])    a. Echo 2013: EF 55-60%, mild AI, mild MR. b. Echo 5/15 with EF 60-65%, no WMA, mild LVH, mild AI.  [name] Kitchen CKD (chronic kidney disease) stage 3, GFR 30-59 ml/min (HCC) 07/26/2012   a. Felt likely due to HTN.  . CVA (cerebral infarction) 07/28/2012  . Glaucoma   . Gout   . H/O umbilical hernia repair   . Headache(784.0)    " WHEN MY BLOOD PRESSURE IS UP"  . History of acute renal failure   . History of alcohol abuse   . HTN (hypertension)   . Hypertension    a. Patent renal arteries 2005.  [name] Kitchen Noncompliance   . OA (osteoarthritis)   . Other and unspecified hyperlipidemia   . Poor circulation   . Sinus bradycardia   . SVT (supraventricular tachycardia) ([place])    a. Possible AVNRT.  Got adenosine from EMS in 2015 for SVT.  During 5/15 hospitalization had episode of short R-P tachycardia (possible AVNRT) terminated by adenosine.  b. 04/2016 - recurrent tachycardia (SVT vs. AVNRT), again  terminated by Adenosine  . Visual impairment      Family History  Problem Relation Age of Onset  . CVA Mother   . Hypertension Brother   . Hypertension Sister   . Hypertension Brother   . Stroke Sister   . Heart attack Neg Hx      Current Outpatient Medications:  .  acetaminophen (TYLENOL) 650 MG CR tablet, Take 650 mg by mouth every 8 (eight) hours as needed for pain., Disp: , Rfl:  .  atorvastatin (LIPITOR) 80 MG tablet, TAKE 1 TABLET(80 MG) BY MOUTH DAILY AT 6 PM, Disp: 90 tablet, Rfl: 1 .  ELIQUIS 2.5 MG TABS tablet, TAKE 1 TABLET(2.5 MG) BY MOUTH TWICE DAILY, Disp: 60 tablet, Rfl: 4 .  FEROSUL 325 (65 Fe) MG tablet, TAKE 1 TABLET(325 MG) BY MOUTH DAILY, Disp: 90 tablet, Rfl: 1 .  LUMIGAN 0.01 % SOLN, Place 1 drop into both eyes at bedtime., Disp: , Rfl:  .  pantoprazole (PROTONIX) 40 MG tablet, Take 1 tablet (40 mg total) by mouth 2 (two) times daily., Disp: 60 tablet, Rfl: 2   No Known Allergies   Review of Systems  Constitutional: Negative.   Eyes: Negative.   Gastrointestinal: Negative.   Endocrine: Negative.   Musculoskeletal: Negative.   Skin: Negative.   Psychiatric/Behavioral: Negative.      There were no vitals filed for this  visit. There is no height or weight on file to calculate BMI.   Objective:  Physical Exam      Assessment And Plan:     1. Essential hypertension     Patient was given opportunity to ask questions. Patient verbalized understanding of the plan and was able to repeat key elements of the plan. All questions were answered to their satisfaction.  [address] [place], CMA   I, [place], [place], have reviewed all documentation for this visit. The documentation on 12/09/20 for the exam, diagnosis, procedures, and orders are all accurate and complete.   IF YOU HAVE BEEN REFERRED TO A SPECIALIST, IT MAY TAKE 1-2 WEEKS TO SCHEDULE/PROCESS THE REFERRAL. IF YOU HAVE NOT HEARD FROM US/SPECIALIST IN TWO WEEKS, PLEASE GIVE [place] A CALL AT  [phone] X [phone].   THE PATIENT IS ENCOURAGED TO PRACTICE SOCIAL DISTANCING DUE TO THE COVID-19 PANDEMIC.

## 2020-12-09 NOTE — Patient Instructions (Signed)

## 2020-12-10 LAB — CBC
Hematocrit: 38.7 % (ref 37.5–51.0)
Hemoglobin: 11.8 g/dL — ABNORMAL LOW (ref 13.0–17.7)
MCH: 25.1 pg — ABNORMAL LOW (ref 26.6–33.0)
MCHC: 30.5 g/dL — ABNORMAL LOW (ref 31.5–35.7)
MCV: 82 fL (ref 79–97)
Platelets: 112 10*3/uL — ABNORMAL LOW (ref 150–450)
RBC: 4.71 x10E6/uL (ref 4.14–5.80)
RDW: 16.6 % — ABNORMAL HIGH (ref 11.6–15.4)
WBC: 3.3 10*3/uL — ABNORMAL LOW (ref 3.4–10.8)

## 2020-12-10 LAB — BMP8+EGFR
BUN/Creatinine Ratio: 10 (ref 10–24)
BUN: 15 mg/dL (ref 8–27)
CO2: 22 mmol/L (ref 20–29)
Calcium: 7.8 mg/dL — ABNORMAL LOW (ref 8.6–10.2)
Chloride: 105 mmol/L (ref 96–106)
Creatinine, Ser: 1.57 mg/dL — ABNORMAL HIGH (ref 0.76–1.27)
Glucose: 75 mg/dL (ref 65–99)
Potassium: 5 mmol/L (ref 3.5–5.2)
Sodium: 139 mmol/L (ref 134–144)
eGFR: 43 mL/min/{1.73_m2} — ABNORMAL LOW (ref >59)

## 2020-12-10 LAB — HEMOGLOBIN A1C
Est. average glucose Bld gHb Est-mCnc: 117 mg/dL
Hgb A1c MFr Bld: 5.7 % — ABNORMAL HIGH (ref 4.8–5.6)

## 2020-12-11 NOTE — Patient Instructions (Signed)
Goals Addressed    . Absence of Fall and Fall-Related injury   On track    Timeframe:  Long-Range Goal Priority:  High Start Date:  11/29/20                           Expected End Date: 05/31/21  Next Follow Up date: 02/21/21  Self-Care Activities:  . Continue to keep all scheduled follow up appointments . Take medications as directed  . Let your healthcare team know if you are unable to take your medications . Call your pharmacy for refills at least 7 days prior to running out of medication . Report falls to PCP provider promptly and or seek medical care right away if injured from fall  Patient Goals:  - Utilize (assistive device) appropriately with all ambulation - De-clutter walkways - Change positions slowly - Wear secure fitting shoes at all times with ambulation - Utilize home lighting for dim lit areas - Demonstrate self and pet awareness at all times                         . COMPLETED: Assist with Chronic Care Management and Care Coordination needs       Timeframe:  Short-Term Goal Priority:  High Start Date:  10/24/20                           Expected End Date:  12/24/20  Next Scheduled Follow up date: 11/29/20  Patient Goals/Self-Care Activities:        . Patient will work with the embedded CCM team to address Chronic Care management and Care Coordination needs for Paroxysmal atrial fibrillation, Iron deficiency anemia, Essential Hypertension, Prediabetes                  . Follow My Treatment Plan-Chronic Kidney   On track    Timeframe:  Long-Range Goal Priority:  High Start Date:  11/29/20                          Expected End Date: 05/31/21                      Follow Up Date: 02/21/21  . Continue to adhere to MD recommendations for CKD  . Continue to keep all scheduled follow up appointments . Take medications as directed  . Let your healthcare team know if you are unable to take your medications . Call your pharmacy for refills at least 7 days prior to running  out of medication . Increase your water intake unless otherwise directed Review mailed printed educational materials related to Kidney disease   Why is this important?    Staying as healthy as you can is very important. This may mean making changes if you smoke, don't exercise or eat poorly.   A healthy lifestyle is an important goal for you.   Following the treatment plan and making changes may be hard.   Try some of these steps to help keep the disease from getting worse.     Notes:       Patient Care Plan: Assist with Chronic Care Management and Care Coordination needs  Completed 12/11/2020  Problem Identified: Assist with Chronic Care Management and Care Coordination needs Resolved 11/29/2020  Priority: High    Goal: Assist with Chronic Care Management and Care  Coordination needs Completed 11/29/2020  Start Date: 10/24/2020  Expected End Date: 12/24/2020  Recent Progress: On track  Priority: High  Note:   Current Barriers:  [name] Kitchen Knowledge Barriers related to resources and support available to address needs related to Chronic Care management and Care Coordination needs for Paroxysmal atrial fibrillation, Iron deficiency anemia, Essential Hypertension, Prediabetes  Case Manager Clinical Goal(s):  [name] Kitchen Over the next 60 days, patient will work with CCM RN CM to address needs related to Chronic Care management and Care Coordination needs for  Interventions:  . Collaborated with  embedded BSW [name]  to establish an individualized plan of care  Patient Self Care Activities:  . Patient will work with the embedded CCM team to address Chronic Care management and Care Coordination needs for Paroxysmal atrial fibrillation, Iron deficiency anemia, Essential Hypertension, Prediabetes   Initial CCM RN CM call scheduled for:  11/29/20    Patient Care Plan: Social Work [facility] Care Plan    Problem Identified: Mobility and Independence     Goal: Mobility and Independence Optimized   Start  Date: 10/24/2020  Expected End Date: 12/08/2020  Recent Progress: On track  Priority: High  Note:   Current Barriers:  . Chronic disease management support and education needs related to CHF, HTN, and CKD Stage III   . Limited access to caregiver  Social Worker Clinical Goal(s):  [name] Kitchen Over the next 45 days, patient will work with SW to identify caregiver resources  CCM SW Interventions:  . Inter-disciplinary care team collaboration (see longitudinal plan of care) . Collaboration with [name], FNP regarding development and update of comprehensive plan of care as evidenced by provider attestation and co-signature . Successful outbound call placed to the patients sister, Lenice Pressman to assess goal progression . Discussed she is working with the CAP program to become a paid caregiver . Determined [name] has obtained paperwork and will plan to drop off for the patients provider to complete . Scheduled follow up call over the next 30 days  Patient Goals/Self-Care Activities . Over the next 21 days, patient will: With the help of his sister  - Patient will self administer medications as prescribed Patient will attend all scheduled provider appointments Patient will call provider office for new concerns or questions Submit CAP Application Contact SW as needed prior to next scheduled call  Follow Up Plan:  SW will follow up with the patient over the next 30 days    Patient Care Plan: Chronic Kidney (Adult)    Problem Identified: Disease Progression   Priority: High    Long-Range Goal: Disease Progression Prevented or Minimized   Start Date: 11/29/2020  Expected End Date: 05/30/2021  This Visit's Progress: On track  Priority: High  Note:   Current Barriers:   Ineffective Self Health Maintenance  Clinical Goal(s):  [name] Kitchen Collaboration with [name], FNP regarding development and update of comprehensive plan of care as evidenced by provider attestation and  co-signature . Inter-disciplinary care team collaboration (see longitudinal plan of care)  patient will work with care management team to address care coordination and chronic disease management needs related to Disease Management  Educational Needs  Care Coordination  Medication Management and Education  Psychosocial Support   Interventions:   Evaluation of current treatment plan related to  CKD 3b  self-management and patient's adherence to plan as established by provider.  Collaboration with [name], FNP regarding development and update of comprehensive plan of care as evidenced by provider attestation  and co-signature  Inter-disciplinary care team collaboration (see longitudinal plan of care)  11/29/20 completed successful outbound call to sister [facility]  . Provided education to patient about basic disease process related to CKD  . Review of patient status, including review of consultants reports, relevant laboratory and other test results, and medications completed. . Reviewed medications with patient and discussed importance of medication adherence . Educated patient on dietary and educational recommendations; Educated on importance of increasing water to 64 oz per day unless otherwise directed  . Mailed printed educational material related to Stages of Chronic Kidney disease   Discussed plans with patient for ongoing care management follow up and provided patient with direct contact information for care management team Self Care Activities:  . Continue to adhere to MD recommendations for CKD  . Continue to keep all scheduled follow up appointments . Take medications as directed  . Let your healthcare team know if you are unable to take your medications . Call your pharmacy for refills at least 7 days prior to running out of medication . Increase your water intake unless otherwise directed . Review mailed printed educational materials related to Kidney  disease Patient Goals: - maintain adequate renal function   Follow Up Plan: Telephone follow up appointment with care management team member scheduled for: 02/21/21    Patient Care Plan: Fall Risk (Adult)    Problem Identified: Fall Risk   Priority: High    Long-Range Goal: Absence of Fall and Fall-Related Injury   Start Date: 11/29/2020  Expected End Date: 05/30/2021  This Visit's Progress: On track  Priority: High  Note:   Current Barriers:  [name] Kitchen Knowledge Deficits related to fall precautions in patient with Paroxysmal atrial fibrillation, Iron deficiency anemia, Essential Hypertension, Prediabetes  . Decreased adherence to prescribed treatment for fall prevention . Self Care deficits related to impaired gait  . Chronic Disease Management support and education needs related to Impaired Physical Mobility/Gait  Clinical Goal(s):  . patient will demonstrate improved adherence to prescribed treatment plan for decreasing falls as evidenced by patient reporting and review of EMR . patient will verbalize using fall risk reduction strategies discussed . patient will not experience additional falls Interventions:  . Collaboration with [name], FNP regarding development and update of comprehensive plan of care as evidenced by provider attestation and co-signature . Inter-disciplinary care team collaboration (see longitudinal plan of care) . 11/29/20 completed successful outbound call to sister [facility]  . Evaluation of current treatment plan related to and patient's adherence to plan as established by provider. . Provided written and verbal education re: Potential causes of falls and Fall prevention strategies . Reviewed medications and discussed potential side effects of medications such as dizziness and frequent urination . Assessed for s/s of orthostatic hypotension . Assessed patients knowledge of fall risk prevention secondary to previously provided education. . Assessed  working status of life alert bracelet and patient adherence . Discussed plans with patient for ongoing care management follow up and provided patient with direct contact information for care management team Self-Care Activities:  . Continue to keep all scheduled follow up appointments . Take medications as directed  . Let your healthcare team know if you are unable to take your medications . Call your pharmacy for refills at least 7 days prior to running out of medication . Report falls to PCP provider promptly and or seek medical care right away if injured from fall  Patient Goals:  - Utilize (assistive device) appropriately with  all ambulation - De-clutter walkways - Change positions slowly - Wear secure fitting shoes at all times with ambulation - Utilize home lighting for dim lit areas - Demonstrate self and pet awareness at all times  Follow Up Plan: Telephone follow up appointment with care management team member scheduled for: 02/21/21

## 2020-12-11 NOTE — Chronic Care Management (AMB) (Signed)
Chronic Care Management   CCM RN Visit Note  11/29/2020 Name: Trevor Murphy MRN: [id number] DOB: Dec 27, 1934  Subjective: Trevor Murphy is a 86 y.o. year old male who is a primary care patient of [name], [place]. The care management team was consulted for assistance with disease management and care coordination needs.    Engaged with patient by telephone for initial visit in response to provider referral for case management and/or care coordination services.   Consent to Services:  The patient was given information about Chronic Care Management services, agreed to services, and gave verbal consent prior to initiation of services.  Please see initial visit note for detailed documentation.   Patient agreed to services and verbal consent obtained.   Assessment: Review of patient past medical history, allergies, medications, health status, including review of consultants reports, laboratory and other test data, was performed as part of comprehensive evaluation and provision of chronic care management services.   SDOH (Social Determinants of Health) assessments and interventions performed:  Yes, no acute needs  CCM Care Plan  No Known Allergies  Outpatient Encounter Medications as of 11/29/2020  Medication Sig  . acetaminophen (TYLENOL) 650 MG CR tablet Take 650 mg by mouth every 8 (eight) hours as needed for pain.  [name] Kitchen atorvastatin (LIPITOR) 80 MG tablet TAKE 1 TABLET(80 MG) BY MOUTH DAILY AT 6 PM  . ELIQUIS 2.5 MG TABS tablet TAKE 1 TABLET(2.5 MG) BY MOUTH TWICE DAILY  . FEROSUL 325 (65 Fe) MG tablet TAKE 1 TABLET(325 MG) BY MOUTH DAILY  . LUMIGAN 0.01 % SOLN Place 1 drop into both eyes at bedtime.  . pantoprazole (PROTONIX) 40 MG tablet Take 1 tablet (40 mg total) by mouth 2 (two) times daily.   No facility-administered encounter medications on file as of 11/29/2020.    Patient Active Problem List   Diagnosis Date Noted  . Mixed hyperlipidemia 08/12/2020  . Acute blood loss anemia  08/11/2020  . Paroxysmal atrial fibrillation ([place]) 06/07/2020  . Secondary hypercoagulable state ([place]) 06/07/2020  . Ischemic cerebrovascular accident (CVA) of frontal lobe ([place]) 05/20/2019  . Pain due to onychomycosis of toenails of both feet 01/20/2019  . Prediabetes 07/22/2018  . Nail abnormality 07/22/2018  . Sinus bradycardia 05/02/2016  . Hypotension due to drugs 05/02/2016  . History of tachycardia 05/02/2016  . GERD (gastroesophageal reflux disease) 02/05/2015  . Coronary artery disease involving native coronary artery of native heart without angina pectoris 04/20/2014  . Atypical chest pain 04/12/2014  . Hypertensive urgency 03/11/2014  . Chronic diastolic CHF (congestive heart failure) ([place]) 01/30/2014  . Alcohol abuse 01/19/2014  . Elevated troponin level not due myocardial infarction 12/19/2013  . Gout 12/19/2013  . Dyslipidemia 12/16/2013  . Hypertensive crisis 12/16/2013  . Cerebral infarction ([place]) 07/28/2012  . Acute on chronic renal failure ([place]) 07/27/2012  . Essential hypertension 07/26/2012  . Chronic kidney disease, stage 3b ([place]) 07/26/2012  . Poor circulation   . Recurrent ventral hernia 02/16/2012  . Hernia (acquired) (recurrent) 02/10/2011    Conditions to be addressed/monitored:Paroxysmal atrial fibrillation, Iron deficiency anemia, Essential Hypertension, Prediabetes   Care Plan : Assist with Chronic Care Management and Care Coordination needs  Updates made by Lynne Logan, RN since 12/11/2020 12:00 AM  Completed 12/11/2020  Problem: Assist with Chronic Care Management and Care Coordination needs Resolved 11/29/2020  Priority: High    Goal: Assist with Chronic Care Management and Care Coordination needs Completed 11/29/2020  Start Date: 10/24/2020  Expected End Date: 12/24/2020  Recent Progress: On track  Priority: High  Note:   Current Barriers:  [name] Kitchen Knowledge Barriers related to resources and support available to address needs related to Chronic Care  management and Care Coordination needs for Paroxysmal atrial fibrillation, Iron deficiency anemia, Essential Hypertension, Prediabetes  Case Manager Clinical Goal(s):  [name] Kitchen Over the next 60 days, patient will work with CCM RN CM to address needs related to Chronic Care management and Care Coordination needs for  Interventions:  . Collaborated with  embedded BSW [name]  to establish an individualized plan of care  Patient Self Care Activities:  . Patient will work with the embedded CCM team to address Chronic Care management and Care Coordination needs for Paroxysmal atrial fibrillation, Iron deficiency anemia, Essential Hypertension, Prediabetes   Initial CCM RN CM call scheduled for:  11/29/20    Care Plan : Chronic Kidney (Adult)  Updates made by Lynne Logan, RN since 12/11/2020 12:00 AM    Problem: Disease Progression   Priority: High    Long-Range Goal: Disease Progression Prevented or Minimized   Start Date: 11/29/2020  Expected End Date: 05/30/2021  This Visit's Progress: On track  Priority: High  Note:   Current Barriers:   Ineffective Self Health Maintenance  Clinical Goal(s):  [name] Kitchen Collaboration with [name], FNP regarding development and update of comprehensive plan of care as evidenced by provider attestation and co-signature . Inter-disciplinary care team collaboration (see longitudinal plan of care)  patient will work with care management team to address care coordination and chronic disease management needs related to Disease Management  Educational Needs  Care Coordination  Medication Management and Education  Psychosocial Support   Interventions:   Evaluation of current treatment plan related to  CKD 3b  self-management and patient's adherence to plan as established by provider.  Collaboration with [name], FNP regarding development and update of comprehensive plan of care as evidenced by provider attestation       and  co-signature  Inter-disciplinary care team collaboration (see longitudinal plan of care)  11/29/20 completed successful outbound call to sister [facility]  . Provided education to patient about basic disease process related to CKD  . Review of patient status, including review of consultants reports, relevant laboratory and other test results, and medications completed. . Reviewed medications with patient and discussed importance of medication adherence . Educated patient on dietary and educational recommendations; Educated on importance of increasing water to 64 oz per day unless otherwise directed  . Mailed printed educational material related to Stages of Chronic Kidney disease   Discussed plans with patient for ongoing care management follow up and provided patient with direct contact information for care management team Self Care Activities:  . Continue to adhere to MD recommendations for CKD  . Continue to keep all scheduled follow up appointments . Take medications as directed  . Let your healthcare team know if you are unable to take your medications . Call your pharmacy for refills at least 7 days prior to running out of medication . Increase your water intake unless otherwise directed . Review mailed printed educational materials related to Kidney disease Patient Goals: - maintain adequate renal function   Follow Up Plan: Telephone follow up appointment with care management team member scheduled for: 02/21/21    Care Plan : Fall Risk (Adult)  Updates made by Lynne Logan, RN since 12/11/2020 12:00 AM    Problem: Fall Risk   Priority: High    Long-Range Goal: Absence  of Fall and Fall-Related Injury   Start Date: 11/29/2020  Expected End Date: 05/30/2021  This Visit's Progress: On track  Priority: High  Note:   Current Barriers:  [name] Kitchen Knowledge Deficits related to fall precautions in patient with Paroxysmal atrial fibrillation, Iron deficiency anemia, Essential  Hypertension, Prediabetes  . Decreased adherence to prescribed treatment for fall prevention . Self Care deficits related to impaired gait  . Chronic Disease Management support and education needs related to Impaired Physical Mobility/Gait  Clinical Goal(s):  . patient will demonstrate improved adherence to prescribed treatment plan for decreasing falls as evidenced by patient reporting and review of EMR . patient will verbalize using fall risk reduction strategies discussed . patient will not experience additional falls Interventions:  . Collaboration with [name], FNP regarding development and update of comprehensive plan of care as evidenced by provider attestation and co-signature . Inter-disciplinary care team collaboration (see longitudinal plan of care) . 11/29/20 completed successful outbound call to sister [facility]  . Evaluation of current treatment plan related to and patient's adherence to plan as established by provider. . Provided written and verbal education re: Potential causes of falls and Fall prevention strategies . Reviewed medications and discussed potential side effects of medications such as dizziness and frequent urination . Assessed for s/s of orthostatic hypotension . Assessed patients knowledge of fall risk prevention secondary to previously provided education. . Assessed working status of life alert bracelet and patient adherence . Discussed plans with patient for ongoing care management follow up and provided patient with direct contact information for care management team Self-Care Activities:  . Continue to keep all scheduled follow up appointments . Take medications as directed  . Let your healthcare team know if you are unable to take your medications . Call your pharmacy for refills at least 7 days prior to running out of medication . Report falls to PCP provider promptly and or seek medical care right away if injured from fall  Patient Goals:  -  Utilize (assistive device) appropriately with all ambulation - De-clutter walkways - Change positions slowly - Wear secure fitting shoes at all times with ambulation - Utilize home lighting for dim lit areas - Demonstrate self and pet awareness at all times  Follow Up Plan: Telephone follow up appointment with care management team member scheduled for: 02/21/21     Plan:Telephone follow up appointment with care management team member scheduled for:  02/21/21  Barb Merino, RN, BSN, CCM Care Management Coordinator [place] [facility]  Direct Phone: [phone]

## 2020-12-12 NOTE — Progress Notes (Signed)
Carelink Summary Report / Loop Recorder 

## 2020-12-13 ENCOUNTER — Telehealth: Payer: Self-pay | Admitting: Emergency Medicine

## 2020-12-13 ENCOUNTER — Telehealth: Payer: Self-pay

## 2020-12-13 NOTE — Telephone Encounter (Signed)
Left the patient a message to call back for lab results. 

## 2020-12-13 NOTE — Telephone Encounter (Signed)
Contacted patient and spoke with patient's DPR (patient's sister) she states that patient is not currently with her but that she received the 1st message and had not called the [facility] back. She states that patient has had recent follow up with primary physician and is doing good with no issues. She states that she will see patient today and let him know that the [facility] was touching base from the previous phone call to follow up with the patient.

## 2020-12-13 NOTE — Telephone Encounter (Signed)
-----   Message from [name], [place] sent at 12/11/2020  5:10 PM EDT ----- Kidney functions are slightly better. Continue to stay well hydrated with water. HgbA1c is up to 5.7 cut back on sugary foods and drinks. Blood levels are slightly improved, continue follow up with hematology

## 2020-12-14 ENCOUNTER — Other Ambulatory Visit: Payer: Self-pay | Admitting: Nurse Practitioner

## 2020-12-16 ENCOUNTER — Encounter: Payer: Self-pay | Admitting: Nurse Practitioner

## 2020-12-24 ENCOUNTER — Telehealth: Payer: Medicare Other

## 2020-12-24 ENCOUNTER — Telehealth: Payer: Self-pay

## 2020-12-24 NOTE — Telephone Encounter (Signed)
  Care Management   Follow Up Note   12/24/2020 Name: Trevor Murphy MRN: [id number] DOB: 05-07-1935   Referred by: [name], FNP Reason for referral : Chronic Care Management   An unsuccessful telephone outreach was attempted today. The patient was referred to the case management team for assistance with care management and care coordination.   Follow Up Plan: A HIPPA compliant phone message was left for the patient providing contact information and requesting a return call. The Care Management team will attempt a second contact over the next 21 days.  [name], BSW, CDP Social Worker, Certified Dementia Practitioner [place] [facility] [place] [facility] [phone]

## 2020-12-26 ENCOUNTER — Ambulatory Visit (INDEPENDENT_AMBULATORY_CARE_PROVIDER_SITE_OTHER): Payer: Medicare Other

## 2020-12-26 DIAGNOSIS — I639 Cerebral infarction, unspecified: Secondary | ICD-10-CM

## 2021-01-02 LAB — CUP PACEART REMOTE DEVICE CHECK
Date Time Interrogation Session: 20220524230410
Implantable Pulse Generator Implant Date: 20201014

## 2021-01-13 ENCOUNTER — Telehealth: Payer: Medicare Other

## 2021-01-13 ENCOUNTER — Telehealth: Payer: Self-pay

## 2021-01-13 NOTE — Telephone Encounter (Signed)
  Care Management   Follow Up Note   01/13/2021 Name: Trevor Murphy MRN: [id number] DOB: 1934-09-20   Referred by: [name], FNP Reason for referral : Chronic Care Management   A second unsuccessful telephone outreach was attempted today. The patient was referred to the case management team for assistance with care management and care coordination. SW performed chart review to note physician portion of CAP application completed on 5.2.22. SW attempted to contact the patients sister to follow up on application status. HIPAA compliant voice message left requesting a return call.  Follow Up Plan: The care management team will reach out to the patient again over the next 30 days.   [name], BSW, CDP Social Worker, Certified Dementia Practitioner [place] [facility] [place] [facility] [phone]

## 2021-01-17 ENCOUNTER — Ambulatory Visit (HOSPITAL_COMMUNITY)
Admission: RE | Admit: 2021-01-17 | Discharge: 2021-01-17 | Disposition: A | Discharge status: Home / Self Care | Payer: Medicare Other | Source: Ambulatory Visit | Attending: Physician Assistant | Admitting: Physician Assistant

## 2021-01-17 ENCOUNTER — Other Ambulatory Visit: Payer: Self-pay

## 2021-01-17 VITALS — BP 172/80 | HR 64 | Ht 65.0 in | Wt 128.0 lb

## 2021-01-17 DIAGNOSIS — Z8249 Family history of ischemic heart disease and other diseases of the circulatory system: Secondary | ICD-10-CM | POA: Diagnosis not present

## 2021-01-17 DIAGNOSIS — Z7901 Long term (current) use of anticoagulants: Secondary | ICD-10-CM | POA: Insufficient documentation

## 2021-01-17 DIAGNOSIS — Z79899 Other long term (current) drug therapy: Secondary | ICD-10-CM | POA: Diagnosis not present

## 2021-01-17 DIAGNOSIS — I130 Hypertensive heart and chronic kidney disease with heart failure and stage 1 through stage 4 chronic kidney disease, or unspecified chronic kidney disease: Secondary | ICD-10-CM | POA: Insufficient documentation

## 2021-01-17 DIAGNOSIS — I5032 Chronic diastolic (congestive) heart failure: Secondary | ICD-10-CM | POA: Diagnosis not present

## 2021-01-17 DIAGNOSIS — D6869 Other thrombophilia: Secondary | ICD-10-CM | POA: Diagnosis not present

## 2021-01-17 DIAGNOSIS — Z8673 Personal history of transient ischemic attack (TIA), and cerebral infarction without residual deficits: Secondary | ICD-10-CM | POA: Insufficient documentation

## 2021-01-17 DIAGNOSIS — N183 Chronic kidney disease, stage 3 unspecified: Secondary | ICD-10-CM | POA: Diagnosis not present

## 2021-01-17 DIAGNOSIS — I480 Paroxysmal atrial fibrillation: Secondary | ICD-10-CM | POA: Diagnosis not present

## 2021-01-17 DIAGNOSIS — I2510 Atherosclerotic heart disease of native coronary artery without angina pectoris: Secondary | ICD-10-CM | POA: Insufficient documentation

## 2021-01-17 NOTE — Progress Notes (Signed)
Primary Care Physician: [name], [place] Primary Cardiologist: Dr Aundra Dubin (remotely) Primary Electrophysiologist: Dr Curt Bears  Referring Physician: Dr [facility]   Trevor Murphy is a 86 y.o. male with a history of SVT, non obstructive CAD, chronic diastolic CHF, anemia, CKD stage III, prior CVA, HTN, and paroxysmal atrial fibrillation who presents for follow up in the [facility]. The patient was initially diagnosed with atrial fibrillation 06/06/20 on ILR following a cryptogenic stroke in 05/2019. Patient was asymptomatic and rate controlled during the episodes. Some appear to be true afib and others SR with PACs. All < 10 minutes. Patient has a CHADS2VASC score of 6. He has been to the ED twice with palpitations which lasted about 1 hour after a BM. At both visits he was in [place] on presentation. ILR showed no arrhythmia events. He was also admitted 08/11/20 with acute anemia and melenic stools. Workup per GI revealed duodenal ulcers, cleared to resume anticoagulation.   On follow up today, patient reports he has done well since his last visit. He denies any heart racing or palpitations. His ILR shows 0% afib burden. He denies any bleeding issues on anticoagulation.   Today, he denies symptoms of palpitations, chest pain, shortness of breath, orthopnea, PND, lower extremity edema, dizziness, presyncope, syncope, snoring, daytime somnolence, bleeding, or neurologic sequela. The patient is tolerating medications without difficulties and is otherwise without complaint today.    Atrial Fibrillation Risk Factors:  he does not have symptoms or diagnosis of sleep apnea. he does not have a history of rheumatic fever. he does not have a history of alcohol use.   he has a BMI of Body mass index is 21.3 kg/m.[name] Kitchen Filed Weights   01/17/21 1058  Weight: 58.1 kg     Family History  Problem Relation Age of Onset   CVA Mother    Hypertension Brother     Hypertension Sister    Hypertension Brother    Stroke Sister    Heart attack Neg Hx      Atrial Fibrillation Management history:  Previous antiarrhythmic drugs: none Previous cardioversions: none Previous ablations: none CHADS2VASC score: 6 Anticoagulation history: Eliquis   Past Medical History:  Diagnosis Date   Arthritis    CAD (coronary artery disease)    a. Cath 08/2009: minimal nonobstructive disease (25% LAD). b. Lexiscan nuc 6/215: EF 54%, no ischemia or infarction.   Chronic diastolic CHF (congestive heart failure) ([place])    a. Echo 2013: EF 55-60%, mild AI, mild MR. b. Echo 5/15 with EF 60-65%, no WMA, mild LVH, mild AI.   CKD (chronic kidney disease) stage 3, GFR 30-59 ml/min (HCC) 07/26/2012   a. Felt likely due to HTN.   CVA (cerebral infarction) 07/28/2012   Glaucoma    Gout    H/O umbilical hernia repair    Headache(784.0)    " WHEN MY BLOOD PRESSURE IS UP"   History of acute renal failure    History of alcohol abuse    HTN (hypertension)    Hypertension    a. Patent renal arteries 2005.   Noncompliance    OA (osteoarthritis)    Other and unspecified hyperlipidemia    Poor circulation    Sinus bradycardia    SVT (supraventricular tachycardia) ([place])    a. Possible AVNRT.  Got adenosine from EMS in 2015 for SVT.  During 5/15 hospitalization had episode of short R-P tachycardia (possible AVNRT) terminated by adenosine.  b. 04/2016 - recurrent tachycardia (SVT  vs. AVNRT), again terminated by Adenosine   Visual impairment    Past Surgical History:  Procedure Laterality Date   APPENDECTOMY     BIOPSY  08/12/2020   Procedure: BIOPSY;  Surgeon: Juanita Craver, MD;  Location: [facility] ENDOSCOPY;  Service: Endoscopy;;   BUBBLE STUDY  05/24/2019   Procedure: BUBBLE STUDY;  Surgeon: Donato Heinz, MD;  Location: [place];  Service: Endoscopy;;   CARDIAC CATHETERIZATION  11/2003   EF 50-55%   CARDIAC CATHETERIZATION  08/2009   CATARACT EXTRACTION EXTRACAPSULAR  Right 05/17/2013   Procedure: CATARACT EXTRACTION EXTRACAPSULAR WITH INTRAOCULAR LENS PLACEMENT ([place]) RIGHT EYE;  Surgeon: Marylynn Pearson, MD;  Location: [place];  Service: Ophthalmology;  Laterality: Right;   COLONOSCOPY     ESOPHAGOGASTRODUODENOSCOPY (EGD) WITH PROPOFOL N/A 08/12/2020   Procedure: ESOPHAGOGASTRODUODENOSCOPY (EGD) WITH PROPOFOL;  Surgeon: Juanita Craver, MD;  Location: [facility] ENDOSCOPY;  Service: Endoscopy;  Laterality: N/A;   HERNIA REPAIR     LOOP RECORDER INSERTION N/A 05/24/2019   Procedure: LOOP RECORDER INSERTION;  Surgeon: Constance Haw, MD;  Location: [place] CV LAB;  Service: Cardiovascular;  Laterality: N/A;   TEE WITHOUT CARDIOVERSION N/A 05/24/2019   Procedure: TRANSESOPHAGEAL ECHOCARDIOGRAM (TEE);  Surgeon: Donato Heinz, MD;  Location: [facility] ENDOSCOPY;  Service: Endoscopy;  Laterality: N/A;   VENTRAL HERNIA REPAIR  03/07/2012   Procedure: LAPAROSCOPIC VENTRAL HERNIA;  Surgeon: Gwenyth Ober, MD;  Location: [place];  Service: General;  Laterality: N/A;    Current Outpatient Medications  Medication Sig Dispense Refill   acetaminophen (TYLENOL) 650 MG CR tablet Take 650 mg by mouth every 8 (eight) hours as needed for pain.     atorvastatin (LIPITOR) 80 MG tablet TAKE 1 TABLET(80 MG) BY MOUTH DAILY AT 6 PM 90 tablet 1   ELIQUIS 2.5 MG TABS tablet TAKE 1 TABLET(2.5 MG) BY MOUTH TWICE DAILY 60 tablet 4   FEROSUL 325 (65 Fe) MG tablet TAKE 1 TABLET(325 MG) BY MOUTH DAILY 90 tablet 1   LUMIGAN 0.01 % SOLN Place 1 drop into both eyes at bedtime.     pantoprazole (PROTONIX) 40 MG tablet TAKE 1 TABLET(40 MG) BY MOUTH TWICE DAILY 60 tablet 2   No current facility-administered medications for this encounter.    No Known Allergies  Social History   Socioeconomic History   Marital status: Married    Spouse name: Not on file   Number of children: Not on file   Years of education: Not on file   Highest education level: Not on file  Occupational History    Occupation: retired  Tobacco Use   Smoking status: Never   Smokeless tobacco: Never  Vaping Use   Vaping Use: Never used  Substance and Sexual Activity   Alcohol use: No    Alcohol/week: 1.0 standard drink    Types: 1 Cans of beer per week    Comment: h/o heavy use, quit in 2017   Drug use: No   Sexual activity: Not Currently  Other Topics Concern   Not on file  Social History Narrative   Not on file   Social Determinants of Health   Financial Resource Strain: Low Risk    Difficulty of Paying Living Expenses: Not hard at all  Food Insecurity: No Food Insecurity   Worried About Charity fundraiser in the Last Year: Never true   [place] in the Last Year: Never true  Transportation Needs: No Transportation Needs   Lack of Transportation (Medical):  No   Lack of Transportation (Non-Medical): No  Physical Activity: Inactive   Days of Exercise per Week: 0 days   Minutes of Exercise per Session: 0 min  Stress: No Stress Concern Present   Feeling of Stress : Not at all  Social Connections: Not on file  Intimate Partner Violence: Not on file     ROS- All systems are reviewed and negative except as per the HPI above.  Physical Exam: Vitals:   01/17/21 1058  BP: (!) 186/82  Pulse: 64  Weight: 58.1 kg  Height: 5\' 5"  (1.651 m)     GEN- The patient is a well appearing elderly male, alert and oriented x 3 today.   HEENT-head normocephalic, atraumatic, sclera clear, conjunctiva pink, hearing intact, trachea midline. Lungs- Clear to ausculation bilaterally, normal work of breathing Heart- Regular rate and rhythm, no murmurs, rubs or gallops  GI- soft, NT, ND, + BS Extremities- no clubbing, cyanosis, or edema MS- no significant deformity or atrophy Skin- no rash or lesion Psych- euthymic mood, full affect Neuro- strength and sensation are intact   Wt Readings from Last 3 Encounters:  01/17/21 58.1 kg  12/09/20 59.8 kg  10/18/20 58.8 kg    EKG today  demonstrates  SR, 1st degree AV block, RBBB Vent. rate 64 BPM PR interval 266 ms QRS duration 122 ms QT/QTcB 458/472 ms  Echo 05/20/19 demonstrated  1. Left ventricular ejection fraction, by visual estimation, is 55 to  60%. The left ventricle has normal function. Normal left ventricular size.  There is severely increased left ventricular hypertrophy.   2. Left ventricular diastolic Doppler parameters are consistent with  impaired relaxation pattern of LV diastolic filling.   3. Global right ventricle has normal systolic function.The right  ventricular size is normal. No increase in right ventricular wall  thickness.   4. Left atrial size was normal.   5. Right atrial size was normal.   6. The mitral valve is normal in structure. Mild mitral valve  regurgitation. No evidence of mitral stenosis.   7. The tricuspid valve is normal in structure. Tricuspid valve  regurgitation was not visualized by color flow Doppler.   8. The aortic valve is normal in structure. Aortic valve regurgitation is  mild by color flow Doppler. Mild to moderate aortic valve  sclerosis/calcification without any evidence of aortic stenosis.   9. The pulmonic valve was normal in structure. Pulmonic valve  regurgitation is not visualized by color flow Doppler.  10. The inferior vena cava is normal in size with greater than 50%  respiratory variability, suggesting right atrial pressure of 3 mmHg.   Epic records are reviewed at length today  CHA2DS2-VASc Score = 6  The patient's score is based upon: CHF History: No HTN History: Yes Diabetes History: No Stroke History: Yes Vascular Disease History: Yes Age Score: 2 Gender Score: 0      ASSESSMENT AND PLAN: 1. Paroxysmal Atrial Fibrillation (ICD10:  I48.0) The patient's CHA2DS2-VASc score is 6, indicating a 9.7% annual risk of stroke.   ILR shows 0% afib burden with very brief episodes less than one minute.  Continue Eliquis 2.5 mg (age, Cr >1.5)  2.  Secondary Hypercoagulable State (ICD10:  D68.69) The patient is at significant risk for stroke/thromboembolism based upon his CHA2DS2-VASc Score of 6.  Continue Apixaban (Eliquis).   3. HTN Elevated today, better controlled at previous visits and at home. Patient to check BP at home and call clinic if BP remains elevated.  4. CAD Non obstructive on LHC 2011 and normal stress test 2015. No anginal symptoms.   Follow up in the AF clinic in 6 months.    [facility] [address] [place], [place] [address] [phone] 01/17/2021 11:36 AM

## 2021-01-17 NOTE — Progress Notes (Signed)
Carelink Summary Report / Loop Recorder 

## 2021-01-20 ENCOUNTER — Other Ambulatory Visit: Payer: Self-pay | Admitting: Hematology and Oncology

## 2021-01-20 ENCOUNTER — Other Ambulatory Visit: Payer: Self-pay

## 2021-01-20 ENCOUNTER — Inpatient Hospital Stay: Payer: Medicare Other | Attending: Hematology and Oncology

## 2021-01-20 ENCOUNTER — Inpatient Hospital Stay (HOSPITAL_BASED_OUTPATIENT_CLINIC_OR_DEPARTMENT_OTHER): Payer: Medicare Other | Admitting: Hematology and Oncology

## 2021-01-20 ENCOUNTER — Inpatient Hospital Stay: Payer: Medicare Other

## 2021-01-20 VITALS — BP 157/74 | HR 55 | Temp 98.1°F | Resp 17 | Wt 128.0 lb

## 2021-01-20 DIAGNOSIS — D708 Other neutropenia: Secondary | ICD-10-CM | POA: Diagnosis not present

## 2021-01-20 DIAGNOSIS — D500 Iron deficiency anemia secondary to blood loss (chronic): Secondary | ICD-10-CM

## 2021-01-20 DIAGNOSIS — N1832 Chronic kidney disease, stage 3b: Secondary | ICD-10-CM | POA: Diagnosis not present

## 2021-01-20 DIAGNOSIS — I129 Hypertensive chronic kidney disease with stage 1 through stage 4 chronic kidney disease, or unspecified chronic kidney disease: Secondary | ICD-10-CM | POA: Insufficient documentation

## 2021-01-20 DIAGNOSIS — K922 Gastrointestinal hemorrhage, unspecified: Secondary | ICD-10-CM | POA: Insufficient documentation

## 2021-01-20 DIAGNOSIS — D696 Thrombocytopenia, unspecified: Secondary | ICD-10-CM | POA: Insufficient documentation

## 2021-01-20 DIAGNOSIS — I2510 Atherosclerotic heart disease of native coronary artery without angina pectoris: Secondary | ICD-10-CM | POA: Insufficient documentation

## 2021-01-20 DIAGNOSIS — Z8249 Family history of ischemic heart disease and other diseases of the circulatory system: Secondary | ICD-10-CM | POA: Diagnosis not present

## 2021-01-20 DIAGNOSIS — Z9049 Acquired absence of other specified parts of digestive tract: Secondary | ICD-10-CM | POA: Diagnosis not present

## 2021-01-20 DIAGNOSIS — Z79899 Other long term (current) drug therapy: Secondary | ICD-10-CM | POA: Diagnosis not present

## 2021-01-20 DIAGNOSIS — Z7901 Long term (current) use of anticoagulants: Secondary | ICD-10-CM | POA: Diagnosis not present

## 2021-01-20 DIAGNOSIS — D72819 Decreased white blood cell count, unspecified: Secondary | ICD-10-CM | POA: Insufficient documentation

## 2021-01-20 DIAGNOSIS — Z8673 Personal history of transient ischemic attack (TIA), and cerebral infarction without residual deficits: Secondary | ICD-10-CM | POA: Insufficient documentation

## 2021-01-20 DIAGNOSIS — Z823 Family history of stroke: Secondary | ICD-10-CM | POA: Diagnosis not present

## 2021-01-20 DIAGNOSIS — D709 Neutropenia, unspecified: Secondary | ICD-10-CM | POA: Diagnosis not present

## 2021-01-20 LAB — CBC WITH DIFFERENTIAL (CANCER CENTER ONLY)
Abs Immature Granulocytes: 0 10*3/uL (ref 0.00–0.07)
Basophils Absolute: 0 10*3/uL (ref 0.0–0.1)
Basophils Relative: 1 %
Eosinophils Absolute: 0.1 10*3/uL (ref 0.0–0.5)
Eosinophils Relative: 4 %
HCT: 37.4 % — ABNORMAL LOW (ref 39.0–52.0)
Hemoglobin: 11.7 g/dL — ABNORMAL LOW (ref 13.0–17.0)
Immature Granulocytes: 0 %
Lymphocytes Relative: 25 %
Lymphs Abs: 0.5 10*3/uL — ABNORMAL LOW (ref 0.7–4.0)
MCH: 25.7 pg — ABNORMAL LOW (ref 26.0–34.0)
MCHC: 31.3 g/dL (ref 30.0–36.0)
MCV: 82 fL (ref 80.0–100.0)
Monocytes Absolute: 0.3 10*3/uL (ref 0.1–1.0)
Monocytes Relative: 12 %
Neutro Abs: 1.3 10*3/uL — ABNORMAL LOW (ref 1.7–7.7)
Neutrophils Relative %: 58 %
Platelet Count: 78 10*3/uL — ABNORMAL LOW (ref 150–400)
RBC: 4.56 MIL/uL (ref 4.22–5.81)
RDW: 17.7 % — ABNORMAL HIGH (ref 11.5–15.5)
WBC Count: 2.2 10*3/uL — ABNORMAL LOW (ref 4.0–10.5)
nRBC: 0 % (ref 0.0–0.2)

## 2021-01-20 LAB — CMP (CANCER CENTER ONLY)
ALT: 24 U/L (ref 0–44)
AST: 26 U/L (ref 15–41)
Albumin: 3.3 g/dL — ABNORMAL LOW (ref 3.5–5.0)
Alkaline Phosphatase: 106 U/L (ref 38–126)
Anion gap: 5 (ref 5–15)
BUN: 18 mg/dL (ref 8–23)
CO2: 25 mmol/L (ref 22–32)
Calcium: 7.9 mg/dL — ABNORMAL LOW (ref 8.9–10.3)
Chloride: 107 mmol/L (ref 98–111)
Creatinine: 1.69 mg/dL — ABNORMAL HIGH (ref 0.61–1.24)
GFR, Estimated: 40 mL/min — ABNORMAL LOW (ref >60)
Glucose, Bld: 97 mg/dL (ref 70–99)
Potassium: 4.3 mmol/L (ref 3.5–5.1)
Sodium: 137 mmol/L (ref 135–145)
Total Bilirubin: 0.8 mg/dL (ref 0.3–1.2)
Total Protein: 7 g/dL (ref 6.5–8.1)

## 2021-01-20 LAB — FOLATE: Folate: 6.3 ng/mL (ref >5.9)

## 2021-01-20 LAB — IRON AND TIBC
Iron: 33 ug/dL — ABNORMAL LOW (ref 42–163)
Saturation Ratios: 13 % — ABNORMAL LOW (ref 20–55)
TIBC: 260 ug/dL (ref 202–409)
UIBC: 227 ug/dL (ref 117–376)

## 2021-01-20 LAB — HEPATITIS B SURFACE ANTIGEN: Hepatitis B Surface Ag: NONREACTIVE

## 2021-01-20 LAB — RETIC PANEL
Immature Retic Fract: 7 % (ref 2.3–15.9)
RBC.: 4.57 MIL/uL (ref 4.22–5.81)
Retic Count, Absolute: 40.7 10*3/uL (ref 19.0–186.0)
Retic Ct Pct: 0.9 % (ref 0.4–3.1)
Reticulocyte Hemoglobin: 31.1 pg (ref >27.9)

## 2021-01-20 LAB — HEPATITIS B SURFACE ANTIBODY,QUALITATIVE: Hep B S Ab: NONREACTIVE

## 2021-01-20 LAB — VITAMIN B12: Vitamin B-12: 266 pg/mL (ref 180–914)

## 2021-01-20 LAB — HEPATITIS B CORE ANTIBODY, TOTAL: Hep B Core Total Ab: NONREACTIVE

## 2021-01-20 LAB — FERRITIN: Ferritin: 80 ng/mL (ref 24–336)

## 2021-01-20 LAB — HIV ANTIBODY (ROUTINE TESTING W REFLEX): HIV Screen 4th Generation wRfx: NONREACTIVE

## 2021-01-20 LAB — HEPATITIS C ANTIBODY: HCV Ab: NONREACTIVE

## 2021-01-20 LAB — SAVE SMEAR(SSMR), FOR PROVIDER SLIDE REVIEW

## 2021-01-20 NOTE — Progress Notes (Signed)
[place] [phone]   [phone]  PROGRESS NOTE  Patient Care Team: [name], [place] as PCP - General (General Practice) [name] as [place], [name], RN as [place] History # Iron Deficiency Anemia in the Setting of GI Bleeding 08/12/2020: patient underwent an EGD which showed two non-bleeding cratered duodenal ulcers and multiple dispersed erosions with stigmata of recent bleeding. Hgb 7.0, MCV 79 08/23/2020: establish care with Dr. Lorenso Courier  10/18/2020: WBC 3.4, Hgb 11.5, MCV 81.6, Plt 127. Iron sat 49%, ferritin 44 01/20/2021: WBC 2.2, Hgb 11.7, MCV 82, Plt 78  Interval History:  Trevor Murphy 86 y.o. male with medical history significant for iron deficiency anemia 2/2 to GI bleeding who presents for a follow up visit. The patient's last visit was on 10/18/2020. In the interim since the last visit he has had no major changes in his health.   On exam today Trevor Murphy notes that his energy has been "fine".  He notes that he has not been having any issues with infections, or infectious symptoms.  He also notes that he is not been having any bleeding, bruising, or dark stools.  He notes that he "eats junk" and does not eat nearly as well as he should.  He denies having issues with shortness of breath or low energy.  He reports no fevers, chills, sweats, nausea, vomiting or diarrhea.  A full 10 point ROS is listed below.  MEDICAL HISTORY:  Past Medical History:  Diagnosis Date   Arthritis    CAD (coronary artery disease)    a. Cath 08/2009: minimal nonobstructive disease (25% LAD). b. Lexiscan nuc 6/215: EF 54%, no ischemia or infarction.   Chronic diastolic CHF (congestive heart failure) ([place])    a. Echo 2013: EF 55-60%, mild AI, mild MR. b. Echo 5/15 with EF 60-65%, no WMA, mild LVH, mild AI.   CKD (chronic kidney disease) stage 3, GFR 30-59 ml/min  (HCC) 07/26/2012   a. Felt likely due to HTN.   CVA (cerebral infarction) 07/28/2012   Glaucoma    Gout    H/O umbilical hernia repair    Headache(784.0)    " WHEN MY BLOOD PRESSURE IS UP"   History of acute renal failure    History of alcohol abuse    HTN (hypertension)    Hypertension    a. Patent renal arteries 2005.   Noncompliance    OA (osteoarthritis)    Other and unspecified hyperlipidemia    Poor circulation    Sinus bradycardia    SVT (supraventricular tachycardia) ([place])    a. Possible AVNRT.  Got adenosine from EMS in 2015 for SVT.  During 5/15 hospitalization had episode of short R-P tachycardia (possible AVNRT) terminated by adenosine.  b. 04/2016 - recurrent tachycardia (SVT vs. AVNRT), again terminated by Adenosine   Visual impairment     SURGICAL HISTORY: Past Surgical History:  Procedure Laterality Date   APPENDECTOMY     BIOPSY  08/12/2020   Procedure: BIOPSY;  Surgeon: Juanita Craver, MD;  Location: [facility] ENDOSCOPY;  Service: Endoscopy;;   BUBBLE STUDY  05/24/2019   Procedure: BUBBLE STUDY;  Surgeon: Donato Heinz, MD;  Location[facility] ENDOSCOPY;  Service: Endoscopy;;   CARDIAC CATHETERIZATION  11/2003   EF 50-55%   CARDIAC CATHETERIZATION  08/2009   CATARACT EXTRACTION EXTRACAPSULAR Right 05/17/2013   Procedure: CATARACT EXTRACTION EXTRACAPSULAR WITH INTRAOCULAR LENS PLACEMENT ([place]) RIGHT EYE;  Surgeon:  Marylynn Pearson, MD;  Location: [place];  Service: Ophthalmology;  Laterality: Right;   COLONOSCOPY     ESOPHAGOGASTRODUODENOSCOPY (EGD) WITH PROPOFOL N/A 08/12/2020   Procedure: ESOPHAGOGASTRODUODENOSCOPY (EGD) WITH PROPOFOL;  Surgeon: Juanita Craver, MD;  Location[facility] ENDOSCOPY;  Service: Endoscopy;  Laterality: N/A;   HERNIA REPAIR     LOOP RECORDER INSERTION N/A 05/24/2019   Procedure: LOOP RECORDER INSERTION;  Surgeon: Constance Haw, MD;  Location: [place] CV LAB;  Service: Cardiovascular;  Laterality: N/A;   TEE WITHOUT CARDIOVERSION N/A 05/24/2019    Procedure: TRANSESOPHAGEAL ECHOCARDIOGRAM (TEE);  Surgeon: Donato Heinz, MD;  Location: [facility] ENDOSCOPY;  Service: Endoscopy;  Laterality: N/A;   VENTRAL HERNIA REPAIR  03/07/2012   Procedure: LAPAROSCOPIC VENTRAL HERNIA;  Surgeon: Gwenyth Ober, MD;  Location: [place];  Service: General;  Laterality: N/A;    SOCIAL HISTORY: Social History   Socioeconomic History   Marital status: Married    Spouse name: Not on file   Number of children: Not on file   Years of education: Not on file   Highest education level: Not on file  Occupational History   Occupation: retired  Tobacco Use   Smoking status: Never   Smokeless tobacco: Never  Vaping Use   Vaping Use: Never used  Substance and Sexual Activity   Alcohol use: No    Alcohol/week: 1.0 standard drink    Types: 1 Cans of beer per week    Comment: h/o heavy use, quit in 2017   Drug use: No   Sexual activity: Not Currently  Other Topics Concern   Not on file  Social History Narrative   Not on file   Social Determinants of Health   Financial Resource Strain: Low Risk    Difficulty of Paying Living Expenses: Not hard at all  Food Insecurity: No Food Insecurity   Worried About Charity fundraiser in the Last Year: Never true   Ran Out of Food in the Last Year: Never true  Transportation Needs: No Transportation Needs   Lack of Transportation (Medical): No   Lack of Transportation (Non-Medical): No  Physical Activity: Inactive   Days of Exercise per Week: 0 days   Minutes of Exercise per Session: 0 min  Stress: No Stress Concern Present   Feeling of Stress : Not at all  Social Connections: Not on file  Intimate Partner Violence: Not on file    FAMILY HISTORY: Family History  Problem Relation Age of Onset   CVA Mother    Hypertension Brother    Hypertension Sister    Hypertension Brother    Stroke Sister    Heart attack Neg Hx     ALLERGIES:  has No Known Allergies.  MEDICATIONS:  Current Outpatient  Medications  Medication Sig Dispense Refill   acetaminophen (TYLENOL) 650 MG CR tablet Take 650 mg by mouth every 8 (eight) hours as needed for pain.     atorvastatin (LIPITOR) 80 MG tablet TAKE 1 TABLET(80 MG) BY MOUTH DAILY AT 6 PM 90 tablet 1   ELIQUIS 2.5 MG TABS tablet TAKE 1 TABLET(2.5 MG) BY MOUTH TWICE DAILY 60 tablet 4   FEROSUL 325 (65 Fe) MG tablet TAKE 1 TABLET(325 MG) BY MOUTH DAILY 90 tablet 1   LUMIGAN 0.01 % SOLN Place 1 drop into both eyes at bedtime.     pantoprazole (PROTONIX) 40 MG tablet TAKE 1 TABLET(40 MG) BY MOUTH TWICE DAILY 60 tablet 2   No current facility-administered medications for  this visit.    REVIEW OF SYSTEMS:   Constitutional: ( - ) fevers, ( - )  chills , ( - ) night sweats Eyes: ( - ) blurriness of vision, ( - ) double vision, ( - ) watery eyes Ears, nose, mouth, throat, and face: ( - ) mucositis, ( - ) sore throat Respiratory: ( - ) cough, ( - ) dyspnea, ( - ) wheezes Cardiovascular: ( - ) palpitation, ( - ) chest discomfort, ( - ) lower extremity swelling Gastrointestinal:  ( - ) nausea, ( - ) heartburn, ( - ) change in bowel habits Skin: ( - ) abnormal skin rashes Lymphatics: ( - ) new lymphadenopathy, ( - ) easy bruising Neurological: ( - ) numbness, ( - ) tingling, ( - ) new weaknesses Behavioral/Psych: ( - ) mood change, ( - ) new changes  All other systems were reviewed with the patient and are negative.  PHYSICAL EXAMINATION:  Vitals:   01/20/21 1027  BP: (!) 157/74  Pulse: (!) 55  Resp: 17  Temp: 98.1 F (36.7 C)  SpO2: 100%    Filed Weights   01/20/21 1027  Weight: 128 lb (58.1 kg)     GENERAL: well appearing elderly African American male alert, no distress and comfortable SKIN: skin color, texture, turgor are normal, no rashes or significant lesions EYES: conjunctiva are pink and non-injected, sclera clear LUNGS: clear to auscultation and percussion with normal breathing effort HEART: regular rate & rhythm and no murmurs  and no lower extremity edema Musculoskeletal: no cyanosis of digits and no clubbing  PSYCH: alert & oriented x 3, fluent speech NEURO: no focal motor/sensory deficits  LABORATORY DATA:  I have reviewed the data as listed CBC Latest Ref Rng & Units 01/20/2021 12/09/2020 10/18/2020  WBC 4.0 - 10.5 K/uL 2.2(L) 3.3(L) 3.4(L)  Hemoglobin 13.0 - 17.0 g/dL 11.7(L) 11.8(L) 11.5(L)  Hematocrit 39.0 - 52.0 % 37.4(L) 38.7 38.9(L)  Platelets 150 - 400 K/uL 78(L) 112(L) 127(L)    CMP Latest Ref Rng & Units 01/20/2021 12/09/2020 10/18/2020  Glucose 70 - 99 mg/dL 97 75 120(H)  BUN 8 - 23 mg/dL 18 15 17   Creatinine 0.61 - 1.24 mg/dL 1.69(H) 1.57(H) 1.93(H)  Sodium 135 - 145 mmol/L 137 139 136  Potassium 3.5 - 5.1 mmol/L 4.3 5.0 4.2  Chloride 98 - 111 mmol/L 107 105 107  CO2 22 - 32 mmol/L 25 22 23   Calcium 8.9 - 10.3 mg/dL 7.9(L) 7.8(L) 8.0(L)  Total Protein 6.5 - 8.1 g/dL 7.0 - 7.9  Total Bilirubin 0.3 - 1.2 mg/dL 0.8 - 0.6  Alkaline Phos 38 - 126 U/L 106 - 105  AST 15 - 41 U/L 26 - 18  ALT 0 - 44 U/L 24 - 11    Lab Results  Component Value Date   MPROTEIN Not Observed 08/23/2020   Lab Results  Component Value Date   KPAFRELGTCHN 61.1 (H) 08/23/2020   LAMBDASER 34.8 (H) 08/23/2020   KAPLAMBRATIO 1.76 (H) 08/23/2020     RADIOGRAPHIC STUDIES: CUP PACEART REMOTE DEVICE CHECK  Result Date: 01/02/2021 ILR summary report received. Battery status OK. Normal device function. 13 true brady events.  No new symptom, tachy, or pause episodes. No new AF episodes. Histogram controlled. Monthly summary reports and ROV/PRN. RP    ASSESSMENT & PLAN BRAHIM DOLMAN 87 y.o. male with medical history significant for iron deficiency anemia 2/2 to GI bleeding who presents for a follow up visit.  After review the labs, the  records, schedule the patient the findings most consistent with iron deficiency anemia due to GI bleeding which is properly responding to p.o. iron therapy.  His hemoglobin had increased to 11.7  and he has no overt signs or symptoms of anemia at this time.  I would recommend we continue with his p.o. iron therapy and have him return in 3 months time in order to assure that his stores are adequately repleted.  Unfortunately on exam today he was found to have a drop in his white blood cell count down to 2.2 and his platelet count down to 78.  The reason for this is not clear.  He did previously have borderline low levels of vitamin B12 but fortunately had a negative multiple myeloma panel.  Possible etiologies include nutritional deficiency, liver dysfunction, or bone marrow dysfunction.  In the event we are not able to find a clear etiology would recommend that we pursue a bone marrow biopsy  # Iron Deficiency Anemia in the Setting of GI Bleeding -- findings most consistent with iron deficiency anemia in the setting of GI bleed.   --Hgb increased nicely to 11.7, recommend continue PO ferrous sulfate 318m PO daily.  -- no alternative sources of anemia noted on our workup.  -- May be a component of CKD contributing to the patient's anemia.  Can consider erythropoietin levels if iron deficiency does not adequately explain the extent of the patient's anemia. -- Additional nutritional labs with folic acid and vitamin B12 were WNL. -- if PO iron fails to correct his anemia my recommendation would be for IV Feraheme 510 mg q. 7 days x 2 doses -- Plan for return to clinic in 3 months time for continued monitoring.  #Thrombocytopenia #Leukopenia, Neutropenia -- Etiology is unclear.  The patient is always had borderline low levels of platelets and white blood cell counts, however there has been a very large drop on his counts today --We will order full nutritional panel to include vitamin B12, folate, methylmalonic acid, homocystine as well as copper --Liver dysfunction work-up with hepatitis B and C panels --We will review peripheral blood film --Ultrasound to assure that there is no clear  evidence of liver disease or splenomegaly --If no clear etiology can be found we will need to pursue a bone marrow biopsy.  Orders Placed This Encounter  Procedures   [place]domen Complete    Standing Status:   Future    Standing Expiration Date:   01/20/2022    Order Specific Question:   Reason for Exam (SYMPTOM  OR DIAGNOSIS REQUIRED)    Answer:   thrombocytopenia, assess for liver disease/splenomegaly    Order Specific Question:   [facility]    [facility]  Hepatitis C antibody    Standing Status:   Future    Standing Expiration Date:   01/20/2022   Hepatitis B surface antigen    Standing Status:   Future    Standing Expiration Date:   01/20/2022   Hepatitis B surface antibody    Standing Status:   Future    Standing Expiration Date:   01/20/2022   Hepatitis B core antibody, total    Standing Status:   Future    Standing Expiration Date:   01/20/2022   HIV antibody (with reflex)    Standing Status:   Future    Standing Expiration Date:   01/20/2022   Methylmalonic acid, serum    Standing Status:   Future  Standing Expiration Date:   01/20/2022   Folate, Serum    Standing Status:   Future    Standing Expiration Date:   01/20/2022   Vitamin B12    Standing Status:   Future    Standing Expiration Date:   01/20/2022   Homocysteine, serum    Standing Status:   Future    Standing Expiration Date:   01/20/2022   Copper, serum    Standing Status:   Future    Standing Expiration Date:   01/20/2022   Save Smear (SSMR)    Standing Status:   Future    Standing Expiration Date:   01/20/2022     All questions were answered. The patient knows to call the clinic with any problems, questions or concerns.  A total of more than 30 minutes were spent on this encounter and over half of that time was spent on counseling and coordination of care as outlined above.   Ledell Peoples, MD Department of Hematology/Oncology [place] [facility] Phone: [phone] Pager: [phone] Email: [email or web address]  01/20/2021 11:11 AM

## 2021-01-21 LAB — COPPER, SERUM: Copper: 73 ug/dL (ref 69–132)

## 2021-01-21 LAB — HOMOCYSTEINE: Homocysteine: 26.7 umol/L — ABNORMAL HIGH (ref 0.0–21.3)

## 2021-01-23 ENCOUNTER — Telehealth: Payer: Self-pay | Admitting: Internal Medicine

## 2021-01-23 NOTE — Telephone Encounter (Signed)
Scheduled per los. Called and left msg. Mailed printout  °

## 2021-01-24 LAB — METHYLMALONIC ACID, SERUM: Methylmalonic Acid, Quantitative: 193 nmol/L (ref 0–378)

## 2021-01-27 ENCOUNTER — Ambulatory Visit: Payer: Medicare Other

## 2021-01-31 ENCOUNTER — Ambulatory Visit (HOSPITAL_COMMUNITY): Admission: RE | Admit: 2021-01-31 | Payer: Medicare Other | Source: Ambulatory Visit

## 2021-02-03 ENCOUNTER — Ambulatory Visit (INDEPENDENT_AMBULATORY_CARE_PROVIDER_SITE_OTHER): Payer: Medicare Other

## 2021-02-03 DIAGNOSIS — I639 Cerebral infarction, unspecified: Secondary | ICD-10-CM

## 2021-02-04 LAB — CUP PACEART REMOTE DEVICE CHECK
Date Time Interrogation Session: 20220626230535
Implantable Pulse Generator Implant Date: 20201014

## 2021-02-05 ENCOUNTER — Telehealth: Payer: Medicare Other

## 2021-02-05 ENCOUNTER — Ambulatory Visit: Payer: Self-pay

## 2021-02-05 DIAGNOSIS — I480 Paroxysmal atrial fibrillation: Secondary | ICD-10-CM

## 2021-02-05 DIAGNOSIS — N183 Chronic kidney disease, stage 3 unspecified: Secondary | ICD-10-CM

## 2021-02-05 DIAGNOSIS — R7303 Prediabetes: Secondary | ICD-10-CM

## 2021-02-05 NOTE — Chronic Care Management (AMB) (Signed)
  Care Management   Follow Up Note   02/05/2021 Name: Trevor Murphy MRN: [id number] DOB: Jun 05, 1935   Referred by: [name], FNP Reason for referral : Chronic Care Management (Third unsuccessful outreach attempt)   Third unsuccessful telephone outreach was attempted today. The patient was referred to the case management team for assistance with care management and care coordination. The patient's primary care provider has been notified of our unsuccessful attempts to make or maintain contact with the patient. The care management team is pleased to engage with this patient at any time in the future should he/she be interested in assistance from the care management team.    Patient Care Plan: Social Work [place]  Completed 02/05/2021   Problem Identified: Mobility and Independence Resolved 02/05/2021     Goal: Mobility and Independence Optimized Completed 02/05/2021  Start Date: 10/24/2020  Expected End Date: 12/08/2020  Recent Progress: On track  Priority: High  Note:   Current Barriers:  Chronic disease management support and education needs related to CHF, HTN, and CKD Stage III   Limited access to caregiver  Social Worker Clinical Goal(s):  Over the next 45 days, patient will work with SW to identify caregiver resources  6.29.22 - Goal closed due to inability to maintain patient contact  CCM SW Interventions:  Inter-disciplinary care team collaboration (see longitudinal plan of care) Collaboration with [name], [place] regarding development and update of comprehensive plan of care as evidenced by provider attestation and co-signature Successful outbound call placed to the patients sister, Trevor Murphy to assess goal progression Discussed she is working with the CAP program to become a paid caregiver Determined Trevor Murphy has obtained paperwork and will plan to drop off for the patients provider to complete Scheduled follow up call over the next 30 days  Patient  Goals/Self-Care Activities Over the next 21 days, patient will: With the help of his sister  - Patient will self administer medications as prescribed Patient will attend all scheduled provider appointments Patient will call provider office for new concerns or questions Submit CAP Application Contact SW as needed prior to next scheduled call       Follow Up Plan:  No SW follow up at this time due to inability to maintain patient contact. SW collaboration with Consulting civil engineer to advise of SW closure.  [name], BSW, CDP Social Worker, Certified Dementia Practitioner [place] [facility] [place] [facility] [phone]

## 2021-02-11 ENCOUNTER — Other Ambulatory Visit: Payer: Self-pay

## 2021-02-11 ENCOUNTER — Ambulatory Visit (HOSPITAL_COMMUNITY)
Admission: RE | Admit: 2021-02-11 | Discharge: 2021-02-11 | Disposition: A | Discharge status: Home / Self Care | Payer: Medicare Other | Source: Ambulatory Visit | Attending: Hematology and Oncology | Admitting: Hematology and Oncology

## 2021-02-11 DIAGNOSIS — K824 Cholesterolosis of gallbladder: Secondary | ICD-10-CM | POA: Diagnosis not present

## 2021-02-11 DIAGNOSIS — D500 Iron deficiency anemia secondary to blood loss (chronic): Secondary | ICD-10-CM | POA: Diagnosis not present

## 2021-02-14 ENCOUNTER — Telehealth: Payer: Self-pay | Admitting: *Deleted

## 2021-02-14 NOTE — Telephone Encounter (Signed)
Attempted call to patient. No answer but was able to leave vm message for pt to call back next week at his earliest convenience to [phone]

## 2021-02-14 NOTE — Telephone Encounter (Signed)
-----  Message from Orson Slick, MD sent at 02/13/2021  6:48 PM EDT ----- Please let [name] know that his ultrasound showed no problems with his liver or spleen. The next best step would be for a bone marrow biopsy (we discussed this in clinic). Please ask if he is willing to proceed with a bone marrow biopsy to search for the cause of his low blood counts.  ----- Message ----- From: Interface, Rad Results In Sent: 02/12/2021   9:21 AM EDT To: Orson Slick, MD

## 2021-02-17 ENCOUNTER — Ambulatory Visit (INDEPENDENT_AMBULATORY_CARE_PROVIDER_SITE_OTHER): Payer: Medicare Other | Admitting: Podiatry

## 2021-02-17 ENCOUNTER — Encounter: Payer: Self-pay | Admitting: Podiatry

## 2021-02-17 ENCOUNTER — Other Ambulatory Visit: Payer: Self-pay

## 2021-02-17 DIAGNOSIS — N183 Chronic kidney disease, stage 3 unspecified: Secondary | ICD-10-CM

## 2021-02-17 DIAGNOSIS — M79674 Pain in right toe(s): Secondary | ICD-10-CM | POA: Diagnosis not present

## 2021-02-17 DIAGNOSIS — M79675 Pain in left toe(s): Secondary | ICD-10-CM | POA: Diagnosis not present

## 2021-02-17 DIAGNOSIS — B351 Tinea unguium: Secondary | ICD-10-CM

## 2021-02-17 DIAGNOSIS — I739 Peripheral vascular disease, unspecified: Secondary | ICD-10-CM

## 2021-02-17 NOTE — Telephone Encounter (Signed)
Received call back  from pt's sister regarding [place] results. Advised that the results did not show any abnormalities with pt's liver or spleen. Advised that Dr. Lorenso Courier recommends a bone marrow biopsy to continue to search for the cause of his low blood counts. [name] stated that would be ok with them to go forward with bone marrow biopsy.  Dr. Lorenso Courier made aware.

## 2021-02-17 NOTE — Progress Notes (Signed)
This patient returns to my office for at risk foot care.  This patient requires this care by a professional since this patient will be at risk due to having chronic kidney disease and coagulation defect.  Patient is taking eliquis.   This patient is unable to cut nails himself since the patient cannot reach his nails.These nails are painful walking and wearing shoes.  This patient presents for at risk foot care today.  General Appearance  Alert, conversant and in no acute stress.  Vascular  Dorsalis pedis and posterior tibial  pulses are weakly  palpable  bilaterally.  Capillary return is within normal limits  Bilaterally. Cold feet bilaterally. Absent digital hair  B/L.  Neurologic  Senn-Weinstein monofilament wire test within normal limits  bilaterally. Muscle power within normal limits bilaterally.  Nails Thick disfigured discolored nails with subungual debris  from hallux to fifth toes bilaterally. No evidence of bacterial infection or drainage bilaterally.  Orthopedic  No limitations of motion  feet .  No crepitus or effusions noted.  No bony pathology or digital deformities noted.  HAV  B/L.  Skin  normotropic skin with no porokeratosis noted bilaterally.  No signs of infections or ulcers noted.     Onychomycosis  Pain in right toes  Pain in left toes  Consent was obtained for treatment procedures.   Mechanical debridement of nails 1-5  bilaterally performed with a nail nipper.  Filed with dremel without incident.    Return office visit     10  weeks                Told patient to return for periodic foot care and evaluation due to potential at risk complications.   Gardiner Barefoot DPM

## 2021-02-18 ENCOUNTER — Other Ambulatory Visit: Payer: Self-pay | Admitting: Hematology and Oncology

## 2021-02-18 DIAGNOSIS — D696 Thrombocytopenia, unspecified: Secondary | ICD-10-CM

## 2021-02-21 ENCOUNTER — Telehealth: Payer: Self-pay

## 2021-02-21 ENCOUNTER — Telehealth: Payer: Medicare Other

## 2021-02-21 NOTE — Telephone Encounter (Cosign Needed)
  Care Management   Follow Up Note   02/21/2021 Name: TSUGIO ELISON MRN: [id number] DOB: [birth year]   Referred by: [name], FNP Reason for referral : Chronic Care Management (RN CM Follow up call - 2nd attempt )   An unsuccessful telephone outreach was attempted today. The patient was referred to the case management team for assistance with care management and care coordination.   Follow Up Plan: A HIPPA compliant phone message was left for the patient providing contact information and requesting a return call.   Barb Merino, RN, BSN, CCM Care Management Coordinator [place] [facility]  Direct Phone: [phone]

## 2021-02-24 NOTE — Progress Notes (Signed)
Carelink Summary Report / Loop Recorder 

## 2021-02-28 ENCOUNTER — Other Ambulatory Visit: Payer: Self-pay | Admitting: Student

## 2021-03-03 ENCOUNTER — Encounter (HOSPITAL_COMMUNITY): Payer: Self-pay

## 2021-03-03 ENCOUNTER — Ambulatory Visit (HOSPITAL_COMMUNITY)
Admission: RE | Admit: 2021-03-03 | Discharge: 2021-03-03 | Disposition: A | Discharge status: Home / Self Care | Payer: Medicare Other | Source: Ambulatory Visit | Attending: Hematology and Oncology | Admitting: Hematology and Oncology

## 2021-03-03 ENCOUNTER — Other Ambulatory Visit: Payer: Self-pay

## 2021-03-03 DIAGNOSIS — D72819 Decreased white blood cell count, unspecified: Secondary | ICD-10-CM | POA: Diagnosis not present

## 2021-03-03 DIAGNOSIS — D509 Iron deficiency anemia, unspecified: Secondary | ICD-10-CM | POA: Diagnosis not present

## 2021-03-03 DIAGNOSIS — I2510 Atherosclerotic heart disease of native coronary artery without angina pectoris: Secondary | ICD-10-CM | POA: Diagnosis not present

## 2021-03-03 DIAGNOSIS — D72822 Plasmacytosis: Secondary | ICD-10-CM | POA: Diagnosis not present

## 2021-03-03 DIAGNOSIS — Z79899 Other long term (current) drug therapy: Secondary | ICD-10-CM | POA: Diagnosis not present

## 2021-03-03 DIAGNOSIS — I130 Hypertensive heart and chronic kidney disease with heart failure and stage 1 through stage 4 chronic kidney disease, or unspecified chronic kidney disease: Secondary | ICD-10-CM | POA: Diagnosis not present

## 2021-03-03 DIAGNOSIS — M1990 Unspecified osteoarthritis, unspecified site: Secondary | ICD-10-CM | POA: Insufficient documentation

## 2021-03-03 DIAGNOSIS — N183 Chronic kidney disease, stage 3 unspecified: Secondary | ICD-10-CM | POA: Diagnosis not present

## 2021-03-03 DIAGNOSIS — D696 Thrombocytopenia, unspecified: Secondary | ICD-10-CM

## 2021-03-03 DIAGNOSIS — Z8673 Personal history of transient ischemic attack (TIA), and cerebral infarction without residual deficits: Secondary | ICD-10-CM | POA: Diagnosis not present

## 2021-03-03 DIAGNOSIS — I5032 Chronic diastolic (congestive) heart failure: Secondary | ICD-10-CM | POA: Diagnosis not present

## 2021-03-03 DIAGNOSIS — Z7901 Long term (current) use of anticoagulants: Secondary | ICD-10-CM | POA: Diagnosis not present

## 2021-03-03 LAB — CBC WITH DIFFERENTIAL/PLATELET
Abs Immature Granulocytes: 0 10*3/uL (ref 0.00–0.07)
Basophils Absolute: 0 10*3/uL (ref 0.0–0.1)
Basophils Relative: 0 %
Eosinophils Absolute: 0.1 10*3/uL (ref 0.0–0.5)
Eosinophils Relative: 2 %
HCT: 37.1 % — ABNORMAL LOW (ref 39.0–52.0)
Hemoglobin: 11.6 g/dL — ABNORMAL LOW (ref 13.0–17.0)
Immature Granulocytes: 0 %
Lymphocytes Relative: 26 %
Lymphs Abs: 0.6 10*3/uL — ABNORMAL LOW (ref 0.7–4.0)
MCH: 26.9 pg (ref 26.0–34.0)
MCHC: 31.3 g/dL (ref 30.0–36.0)
MCV: 85.9 fL (ref 80.0–100.0)
Monocytes Absolute: 0.3 10*3/uL (ref 0.1–1.0)
Monocytes Relative: 12 %
Neutro Abs: 1.5 10*3/uL — ABNORMAL LOW (ref 1.7–7.7)
Neutrophils Relative %: 60 %
Platelets: 73 10*3/uL — ABNORMAL LOW (ref 150–400)
RBC: 4.32 MIL/uL (ref 4.22–5.81)
RDW: 17.6 % — ABNORMAL HIGH (ref 11.5–15.5)
WBC: 2.5 10*3/uL — ABNORMAL LOW (ref 4.0–10.5)
nRBC: 0 % (ref 0.0–0.2)

## 2021-03-03 MED ORDER — LIDOCAINE HCL (PF) 1 % IJ SOLN
INTRAMUSCULAR | Status: AC | PRN
  Administered 2021-03-03: 10 mL

## 2021-03-03 MED ORDER — FENTANYL CITRATE (PF) 100 MCG/2ML IJ SOLN
INTRAMUSCULAR | Status: AC
  Filled 2021-03-03: qty 2

## 2021-03-03 MED ORDER — MIDAZOLAM HCL 2 MG/2ML IJ SOLN
INTRAMUSCULAR | Status: AC | PRN
  Administered 2021-03-03: 0.5 mg via INTRAVENOUS

## 2021-03-03 MED ORDER — FENTANYL CITRATE (PF) 100 MCG/2ML IJ SOLN
INTRAMUSCULAR | Status: AC | PRN
  Administered 2021-03-03: 50 ug via INTRAVENOUS

## 2021-03-03 MED ORDER — MIDAZOLAM HCL 2 MG/2ML IJ SOLN
INTRAMUSCULAR | Status: AC
  Filled 2021-03-03: qty 4

## 2021-03-03 MED ORDER — SODIUM CHLORIDE 0.9 % IV SOLN: INTRAVENOUS | Status: DC

## 2021-03-03 MED ORDER — HYDROCODONE-ACETAMINOPHEN 5-325 MG PO TABS: 1.0000 | ORAL_TABLET | ORAL | Status: DC | PRN

## 2021-03-03 NOTE — Progress Notes (Signed)
Patient ID: Trevor Murphy, male   DOB: [birth year], 86 y.o.   MRN: [id number] Patient underwent uneventful CT-guided bone marrow biopsy earlier today.  Prior to discharge patient was noted to have small amount of hematuria per nurse.  Patient does not recall this happening before.  Dr. Lorenso Courier was notified of above findings.  Patient has appointment with Dr. Lorenso Courier on 7/27.  Patient was told to monitor closely and contact physician with any worsening of hematuria. Dr. Franz Dell) also aware.

## 2021-03-03 NOTE — Discharge Instructions (Signed)
Urgent needs - Interventional Radiology on call MD [phone]  Wound - May remove dressing and shower tomorrow (wash gently with soap and water).  Keep site clean and dry.  Replace with bandaid as needed.  Do not submerge in tub or water until site healing well. If closed with glue, glue will flake off on its own.   I

## 2021-03-03 NOTE — Consult Note (Signed)
Chief Complaint: Patient was seen in consultation today for CT-guided bone marrow biopsy    Referring Physician(s): Dorsey,John T IV  Supervising Physician: Mugweru,J  Patient Status: [name] - Out-pt  History of Present Illness: Trevor Murphy is an 86 y.o. male with history of iron deficiency anemia in the setting of prior GI bleeding, and now with thrombocytopenia and leukopenia/neutropenia of uncertain etiology who presents today for CT-guided bone marrow biopsy for further evaluation.  Additional medical history as listed below.  Past Medical History:  Diagnosis Date   Arthritis    CAD (coronary artery disease)    a. Cath 08/2009: minimal nonobstructive disease (25% LAD). b. Lexiscan nuc 6/215: EF 54%, no ischemia or infarction.   Chronic diastolic CHF (congestive heart failure) ([place])    a. Echo 2013: EF 55-60%, mild AI, mild MR. b. Echo 5/15 with EF 60-65%, no WMA, mild LVH, mild AI.   CKD (chronic kidney disease) stage 3, GFR 30-59 ml/min (HCC) 07/26/2012   a. Felt likely due to HTN.   CVA (cerebral infarction) 07/28/2012   Glaucoma    Gout    H/O umbilical hernia repair    Headache(784.0)    " WHEN MY BLOOD PRESSURE IS UP"   History of acute renal failure    History of alcohol abuse    HTN (hypertension)    Hypertension    a. Patent renal arteries 2005.   Noncompliance    OA (osteoarthritis)    Other and unspecified hyperlipidemia    Poor circulation    Sinus bradycardia    SVT (supraventricular tachycardia) ([place])    a. Possible AVNRT.  Got adenosine from EMS in 2015 for SVT.  During 5/15 hospitalization had episode of short R-P tachycardia (possible AVNRT) terminated by adenosine.  b. 04/2016 - recurrent tachycardia (SVT vs. AVNRT), again terminated by Adenosine   Visual impairment     Past Surgical History:  Procedure Laterality Date   APPENDECTOMY     BIOPSY  08/12/2020   Procedure: BIOPSY;  Surgeon: Juanita Craver, MD;  Location: [facility] ENDOSCOPY;  Service:  Endoscopy;;   BUBBLE STUDY  05/24/2019   Procedure: BUBBLE STUDY;  Surgeon: Donato Heinz, MD;  Location: [facility] ENDOSCOPY;  Service: Endoscopy;;   CARDIAC CATHETERIZATION  11/2003   EF 50-55%   CARDIAC CATHETERIZATION  08/2009   CATARACT EXTRACTION EXTRACAPSULAR Right 05/17/2013   Procedure: CATARACT EXTRACTION EXTRACAPSULAR WITH INTRAOCULAR LENS PLACEMENT ([place]) RIGHT EYE;  Surgeon: Marylynn Pearson, MD;  Location: [facility];  Service: Ophthalmology;  Laterality: Right;   COLONOSCOPY     ESOPHAGOGASTRODUODENOSCOPY (EGD) WITH PROPOFOL N/A 08/12/2020   Procedure: ESOPHAGOGASTRODUODENOSCOPY (EGD) WITH PROPOFOL;  Surgeon: Juanita Craver, MD;  Location: [facility] ENDOSCOPY;  Service: Endoscopy;  Laterality: N/A;   HERNIA REPAIR     LOOP RECORDER INSERTION N/A 05/24/2019   Procedure: LOOP RECORDER INSERTION;  Surgeon: Constance Haw, MD;  Location: [facility] CV LAB;  Service: Cardiovascular;  Laterality: N/A;   TEE WITHOUT CARDIOVERSION N/A 05/24/2019   Procedure: TRANSESOPHAGEAL ECHOCARDIOGRAM (TEE);  Surgeon: Donato Heinz, MD;  Location: [facility] ENDOSCOPY;  Service: Endoscopy;  Laterality: N/A;   VENTRAL HERNIA REPAIR  03/07/2012   Procedure: LAPAROSCOPIC VENTRAL HERNIA;  Surgeon: Gwenyth Ober, MD;  Location: [facility];  Service: General;  Laterality: N/A;    Allergies: Patient has no known allergies.  Medications: Prior to Admission medications   Medication Sig Start Date End Date Taking? Authorizing Provider  acetaminophen (TYLENOL) 650 MG CR  tablet Take 650 mg by mouth every 8 (eight) hours as needed for pain.   Yes [provider]  atorvastatin (LIPITOR) 80 MG tablet TAKE 1 TABLET(80 MG) BY MOUTH DAILY AT 6 PM 11/11/20  Yes [name], FNP  ELIQUIS 2.5 MG TABS tablet TAKE 1 TABLET(2.5 MG) BY MOUTH TWICE DAILY 11/14/20  Yes Fenton, Clint R, PA  FEROSUL 325 (65 Fe) MG tablet TAKE 1 TABLET(325 MG) BY MOUTH DAILY 11/14/20  Yes [name], FNP  LUMIGAN 0.01 % SOLN Place 1 drop into both  eyes at bedtime. 08/28/19  Yes [provider]  pantoprazole (PROTONIX) 40 MG tablet TAKE 1 TABLET(40 MG) BY MOUTH TWICE DAILY 12/16/20  Yes [name], FNP     Family History  Problem Relation Age of Onset   CVA Mother    Hypertension Brother    Hypertension Sister    Hypertension Brother    Stroke Sister    Heart attack Neg Hx     Social History   Socioeconomic History   Marital status: Married    Spouse name: Not on file   Number of children: Not on file   Years of education: Not on file   Highest education level: Not on file  Occupational History   Occupation: retired  Tobacco Use   Smoking status: Never   Smokeless tobacco: Never  Vaping Use   Vaping Use: Never used  Substance and Sexual Activity   Alcohol use: No    Alcohol/week: 1.0 standard drink    Types: 1 Cans of beer per week    Comment: h/o heavy use, quit in 2017   Drug use: No   Sexual activity: Not Currently  Other Topics Concern   Not on file  Social History Narrative   Not on file   Social Determinants of Health   Financial Resource Strain: Low Risk    Difficulty of Paying Living Expenses: Not hard at all  Food Insecurity: No Food Insecurity   Worried About Charity fundraiser in the Last Year: Never true   [place] in the Last Year: Never true  Transportation Needs: No Transportation Needs   Lack of Transportation (Medical): No   Lack of Transportation (Non-Medical): No  Physical Activity: Inactive   Days of Exercise per Week: 0 days   Minutes of Exercise per Session: 0 min  Stress: No Stress Concern Present   Feeling of Stress : Not at all  Social Connections: Not on file    Review of Systems currently denies fever, headache, chest pain, dyspnea, cough, abdominal/back pain, nausea, vomiting or visible bleeding.  Vital Signs: BP (!) 185/88   Pulse 67   Temp 97.8 F (36.6 C) (Oral)   Resp 15   SpO2 100%   Physical Exam awake, alert.  Chest clear to auscultation  bilaterally.  Heart with regular rate and rhythm.  Abdomen soft, positive bowel sounds, nontender.  No lower extremity edema.  Imaging: US Abdomen Complete  Result Date: 02/12/2021 CLINICAL DATA:  Iron deficiency anemia secondary to chronic blood loss. EXAM: ABDOMEN ULTRASOUND COMPLETE COMPARISON:  None. FINDINGS: Gallbladder: Gallbladder polyp measuring 8 mm. No gallstones or wall thickening visualized. No sonographic Murphy sign noted by sonographer. Common bile duct: Diameter: 6 mm Liver: No focal lesion identified. Within normal limits in parenchymal echogenicity. Portal vein is patent on color Doppler imaging with normal direction of blood flow towards the liver. IVC: No abnormality visualized. Pancreas: Visualized portion unremarkable. Spleen: Size and appearance  within normal limits. Right Kidney: Length: 10.6 cm. Echogenicity within normal limits. Anechoic 2.8 cm cyst with a single thin internal septation. No solid mass or hydronephrosis visualized. Left Kidney: Length: 9.1 cm. Echogenicity within normal limits. Anechoic 3.5 x 2.3 x 2.0 cm area extending from the medial aspect of the kidney at the renal hila, which demonstrates some internal echoes which may represent artifact. No mass or hydronephrosis visualized. Abdominal aorta: No aneurysm visualized. Other findings: None. IMPRESSION: 1. No ultrasound abnormality of the liver identified. Normal size spleen. 2. Gallbladder polyp measuring 8 mm. Yearly follow up with ultrasound to ensure no interval growth, suggested. Per consensus guidelines, this requires no additional evaluation or specific follow-up. This recommendation follows ACR consensus guidelines: White Paper of the ACR Incidental findings Committee II on Gallbladder and Biliary Findings. J Am Coll Radiol 2013:;10:953-956. 3. Anechoic 3.5 cm area extending from the medial aspect of the left kidney at the renal hila, which demonstrates some possible internal complexity. This is favored  represent a renal sinus cysts or other exophytic renal cyst but is incompletely characterized on this examination, consider renal protocol abdominal MRI or CT with and without contrast for further evaluation. Electronically Signed   By: Dahlia Bailiff MD   On: 02/12/2021 09:19   CUP PACEART REMOTE DEVICE CHECK  Result Date: 02/04/2021 ILR incomplete report received. Routing to triage for complete download. LH   Labs:  CBC: Recent Labs    10/18/20 1426 12/09/20 1215 01/20/21 0948 03/03/21 1001  WBC 3.4* 3.3* 2.2* 2.5*  HGB 11.5* 11.8* 11.7* 11.6*  HCT 38.9* 38.7 37.4* 37.1*  PLT 127* 112* 78* 73*    COAGS: Recent Labs    08/12/20 0251  INR 1.4*  APTT 42*    BMP: Recent Labs    03/12/20 1247 06/07/20 1115 08/08/20 1430 08/11/20 1924 09/15/20 1255 10/11/20 1040 10/18/20 1426 12/09/20 1215 01/20/21 0948  NA 138   < > 141   < > 140 136 136 139 137  K 5.1   < > 5.4*   < > 4.0 3.9 4.2 5.0 4.3  CL 108*   < > 108*   < > 111 108 107 105 107  CO2 21   < > 19*   < > 20* 20* _0 GLUCOSE 89   < > 100*   < > 112* 102* 120* 75 97  BUN 19   < > 21   < > _1 CALCIUM 7.9*   < > 7.9*   < > 7.5* 7.7* 8.0* 7.8* 7.9*  CREATININE 1.60*   < > 1.79*   < > 1.88* 1.66* 1.93* 1.57* 1.69*  GFRNONAA 40*   < > 34*   < > 35* 41* 34*  --  40*  GFRAA 46*  --  40*  --   --   --   --   --   --    < > = values in this interval not displayed.    LIVER FUNCTION TESTS: Recent Labs    08/14/20 0226 08/23/20 1526 10/18/20 1426 01/20/21 0948  BILITOT 0.8 0.5 0.6 0.8  AST _2 ALT _3 ALKPHOS 65 83 105 106  PROT 6.0* 7.3 7.9 7.0  ALBUMIN 3.1* 3.5 4.0 3.3*    TUMOR MARKERS: No results for input(s): AFPTM, CEA, CA199, CHROMGRNA in the last 8760 hours.  Assessment and Plan: 86 y.o. male with history of coronary  artery disease, CHF, chronic kidney disease, CVA, hypertension, arthritis and iron deficiency anemia in the setting of prior GI bleeding; now with  thrombocytopenia and leukopenia/neutropenia of uncertain etiology; presents today for CT-guided bone marrow biopsy for further evaluation.Risks and benefits of procedure was discussed with the patient and/or patient's family including, but not limited to bleeding, infection, damage to adjacent structures or low yield requiring additional tests.  All of the questions were answered and there is agreement to proceed.  Consent signed and in chart.    Thank you for this interesting consult.  I greatly enjoyed meeting KIMONI PAGLIARULO and look forward to participating in their care.  A copy of this report was sent to the requesting provider on this date.  Electronically Signed: D. Rowe Robert, PA-C 03/03/2021, 10:30 AM   I spent a total of  20 minutes   in face to face in clinical consultation, greater than 50% of which was counseling/coordinating care for CT-guided bone marrow biopsy

## 2021-03-03 NOTE — Procedures (Signed)
Vascular and Interventional Radiology Procedure Note  Patient: Trevor Murphy DOB: Jun 27, 1935 Medical Record Number: [id number] Note Date/Time: 03/03/21 11:31 AM   Performing Physician: Michaelle Birks, MD Assistant(s): None  Diagnosis: Thrombocytopenia  Procedure: BONE MARROW BIOPSY  Anesthesia: Conscious Sedation Complications: None Estimated Blood Loss: Minimal Specimens: Sent for Cytology and Pathology  Findings:  Successful CT-guided bone marrow biopsy A total of 1 cores were obtained. Hemostasis of the tract was achieved using Manual Pressure.  Plan: Bed rest for 1 hours.  See detailed procedure note with images in PACS. The patient tolerated the procedure well without incident or complication and was returned to Recovery in stable condition.    Michaelle Birks, MD [facility]   Clinic: [phone]

## 2021-03-05 ENCOUNTER — Inpatient Hospital Stay: Payer: Medicare Other | Attending: Hematology and Oncology

## 2021-03-05 ENCOUNTER — Other Ambulatory Visit: Payer: Self-pay | Admitting: Hematology and Oncology

## 2021-03-05 ENCOUNTER — Inpatient Hospital Stay (HOSPITAL_BASED_OUTPATIENT_CLINIC_OR_DEPARTMENT_OTHER): Payer: Medicare Other | Admitting: Hematology and Oncology

## 2021-03-05 ENCOUNTER — Other Ambulatory Visit: Payer: Self-pay

## 2021-03-05 VITALS — BP 173/64 | HR 61 | Temp 97.9°F | Resp 17 | Wt 130.2 lb

## 2021-03-05 DIAGNOSIS — D709 Neutropenia, unspecified: Secondary | ICD-10-CM | POA: Insufficient documentation

## 2021-03-05 DIAGNOSIS — Z79899 Other long term (current) drug therapy: Secondary | ICD-10-CM | POA: Insufficient documentation

## 2021-03-05 DIAGNOSIS — D696 Thrombocytopenia, unspecified: Secondary | ICD-10-CM | POA: Diagnosis not present

## 2021-03-05 DIAGNOSIS — K922 Gastrointestinal hemorrhage, unspecified: Secondary | ICD-10-CM | POA: Diagnosis not present

## 2021-03-05 DIAGNOSIS — D708 Other neutropenia: Secondary | ICD-10-CM | POA: Diagnosis not present

## 2021-03-05 DIAGNOSIS — D500 Iron deficiency anemia secondary to blood loss (chronic): Secondary | ICD-10-CM

## 2021-03-05 LAB — CMP (CANCER CENTER ONLY)
ALT: 12 U/L (ref 0–44)
AST: 20 U/L (ref 15–41)
Albumin: 3.2 g/dL — ABNORMAL LOW (ref 3.5–5.0)
Alkaline Phosphatase: 92 U/L (ref 38–126)
Anion gap: 7 (ref 5–15)
BUN: 14 mg/dL (ref 8–23)
CO2: 26 mmol/L (ref 22–32)
Calcium: 7.8 mg/dL — ABNORMAL LOW (ref 8.9–10.3)
Chloride: 105 mmol/L (ref 98–111)
Creatinine: 1.69 mg/dL — ABNORMAL HIGH (ref 0.61–1.24)
GFR, Estimated: 40 mL/min — ABNORMAL LOW (ref >60)
Glucose, Bld: 100 mg/dL — ABNORMAL HIGH (ref 70–99)
Potassium: 3.9 mmol/L (ref 3.5–5.1)
Sodium: 138 mmol/L (ref 135–145)
Total Bilirubin: 1.1 mg/dL (ref 0.3–1.2)
Total Protein: 6.7 g/dL (ref 6.5–8.1)

## 2021-03-05 LAB — CBC WITH DIFFERENTIAL (CANCER CENTER ONLY)
Abs Immature Granulocytes: 0 10*3/uL (ref 0.00–0.07)
Basophils Absolute: 0 10*3/uL (ref 0.0–0.1)
Basophils Relative: 1 %
Eosinophils Absolute: 0.1 10*3/uL (ref 0.0–0.5)
Eosinophils Relative: 3 %
HCT: 34.4 % — ABNORMAL LOW (ref 39.0–52.0)
Hemoglobin: 11 g/dL — ABNORMAL LOW (ref 13.0–17.0)
Immature Granulocytes: 0 %
Lymphocytes Relative: 22 %
Lymphs Abs: 0.5 10*3/uL — ABNORMAL LOW (ref 0.7–4.0)
MCH: 26.7 pg (ref 26.0–34.0)
MCHC: 32 g/dL (ref 30.0–36.0)
MCV: 83.5 fL (ref 80.0–100.0)
Monocytes Absolute: 0.3 10*3/uL (ref 0.1–1.0)
Monocytes Relative: 12 %
Neutro Abs: 1.3 10*3/uL — ABNORMAL LOW (ref 1.7–7.7)
Neutrophils Relative %: 62 %
Platelet Count: 77 10*3/uL — ABNORMAL LOW (ref 150–400)
RBC: 4.12 MIL/uL — ABNORMAL LOW (ref 4.22–5.81)
RDW: 17.4 % — ABNORMAL HIGH (ref 11.5–15.5)
WBC Count: 2.1 10*3/uL — ABNORMAL LOW (ref 4.0–10.5)
nRBC: 0 % (ref 0.0–0.2)

## 2021-03-06 ENCOUNTER — Ambulatory Visit (INDEPENDENT_AMBULATORY_CARE_PROVIDER_SITE_OTHER): Payer: Medicare Other

## 2021-03-06 DIAGNOSIS — I639 Cerebral infarction, unspecified: Secondary | ICD-10-CM

## 2021-03-06 NOTE — Progress Notes (Signed)
[place] [phone]   [phone]  PROGRESS NOTE  Patient Care Team: [name], FNP as PCP - General (General Practice) Rex Kras, [name], RN as [place] History # Iron Deficiency Anemia in the Setting of GI Bleeding #Thrombocytopenia/Leukopenia 08/12/2020: patient underwent an EGD which showed two non-bleeding cratered duodenal ulcers and multiple dispersed erosions with stigmata of recent bleeding. Hgb 7.0, MCV 79 08/23/2020: establish care with Dr. Lorenso Courier  10/18/2020: WBC 3.4, Hgb 11.5, MCV 81.6, Plt 127. Iron sat 49%, ferritin 44 01/20/2021: WBC 2.2, Hgb 11.7, MCV 82, Plt 78 03/03/2021: Bone marrow biopsy showed normocellular marrow with mild erythroid hyperplasia. Minor dysplasia noted.   Interval History:  TAEVIN MCFERRAN 86 y.o. male with medical history significant for pancytopenia who presents for a follow up visit. The patient's last visit was on 01/20/2021. In the interim since the last visit he has had a bone marrow biopsy performed which did not show any overt abnormalities.  On exam today Mr. Moede notes he did have some difficulty with bleeding from the site of his bone marrow biopsy.  He notes that he took Eliquis throughout the procedure (as we normally recommend) and they had substantial bleeding into his clothes and bed that night.  He notes that he covered with a bandage and has been well controlled since that time.  He notes that his energy is good and he is breathing just fine.  He does not have any other sites of bleeding, bruising, or dark stools.  He feels well and has no complaints.  He denies having issues with shortness of breath or low energy.  He reports no fevers, chills, sweats, nausea, vomiting or diarrhea.  A full 10 point ROS is listed below.  MEDICAL HISTORY:  Past Medical History:  Diagnosis Date   Arthritis    CAD (coronary artery disease)    a. Cath 08/2009:  minimal nonobstructive disease (25% LAD). b. Lexiscan nuc 6/215: EF 54%, no ischemia or infarction.   Chronic diastolic CHF (congestive heart failure) ([place])    a. Echo 2013: EF 55-60%, mild AI, mild MR. b. Echo 5/15 with EF 60-65%, no WMA, mild LVH, mild AI.   CKD (chronic kidney disease) stage 3, GFR 30-59 ml/min (HCC) 07/26/2012   a. Felt likely due to HTN.   CVA (cerebral infarction) 07/28/2012   Glaucoma    Gout    H/O umbilical hernia repair    Headache(784.0)    " WHEN MY BLOOD PRESSURE IS UP"   History of acute renal failure    History of alcohol abuse    HTN (hypertension)    Hypertension    a. Patent renal arteries 2005.   Noncompliance    OA (osteoarthritis)    Other and unspecified hyperlipidemia    Poor circulation    Sinus bradycardia    SVT (supraventricular tachycardia) ([place])    a. Possible AVNRT.  Got adenosine from EMS in 2015 for SVT.  During 5/15 hospitalization had episode of short R-P tachycardia (possible AVNRT) terminated by adenosine.  b. 04/2016 - recurrent tachycardia (SVT vs. AVNRT), again terminated by Adenosine   Visual impairment     SURGICAL HISTORY: Past Surgical History:  Procedure Laterality Date   APPENDECTOMY     BIOPSY  08/12/2020   Procedure: BIOPSY;  Surgeon: Juanita Craver, MD;  Location: [facility] ENDOSCOPY;  Service: Endoscopy;;   BUBBLE STUDY  05/24/2019   Procedure: BUBBLE STUDY;  Surgeon: Donato Heinz,  MD;  Location: [place] [facility];  Service: Endoscopy;;   CARDIAC CATHETERIZATION  11/2003   EF 50-55%   CARDIAC CATHETERIZATION  08/2009   CATARACT EXTRACTION EXTRACAPSULAR Right 05/17/2013   Procedure: CATARACT EXTRACTION EXTRACAPSULAR WITH INTRAOCULAR LENS PLACEMENT (IOC) RIGHT EYE;  Surgeon: Marylynn Pearson, MD;  Location: [facility];  Service: Ophthalmology;  Laterality: Right;   COLONOSCOPY     ESOPHAGOGASTRODUODENOSCOPY (EGD) WITH PROPOFOL N/A 08/12/2020   Procedure: ESOPHAGOGASTRODUODENOSCOPY (EGD) WITH PROPOFOL;  Surgeon: Juanita Craver, MD;   Location[facility] ENDOSCOPY;  Service: Endoscopy;  Laterality: N/A;   HERNIA REPAIR     LOOP RECORDER INSERTION N/A 05/24/2019   Procedure: LOOP RECORDER INSERTION;  Surgeon: Constance Haw, MD;  Location: [facility] CV LAB;  Service: Cardiovascular;  Laterality: N/A;   TEE WITHOUT CARDIOVERSION N/A 05/24/2019   Procedure: TRANSESOPHAGEAL ECHOCARDIOGRAM (TEE);  Surgeon: Donato Heinz, MD;  Location: [facility] ENDOSCOPY;  Service: Endoscopy;  Laterality: N/A;   VENTRAL HERNIA REPAIR  03/07/2012   Procedure: LAPAROSCOPIC VENTRAL HERNIA;  Surgeon: Gwenyth Ober, MD;  Location: [place];  Service: General;  Laterality: N/A;    SOCIAL HISTORY: Social History   Socioeconomic History   Marital status: Married    Spouse name: Not on file   Number of children: Not on file   Years of education: Not on file   Highest education level: Not on file  Occupational History   Occupation: retired  Tobacco Use   Smoking status: Never   Smokeless tobacco: Never  Vaping Use   Vaping Use: Never used  Substance and Sexual Activity   Alcohol use: No    Alcohol/week: 1.0 standard drink    Types: 1 Cans of beer per week    Comment: h/o heavy use, quit in 2017   Drug use: No   Sexual activity: Not Currently  Other Topics Concern   Not on file  Social History Narrative   Not on file   Social Determinants of Health   Financial Resource Strain: Low Risk    Difficulty of Paying Living Expenses: Not hard at all  Food Insecurity: No Food Insecurity   Worried About Charity fundraiser in the Last Year: Never true   Ran Out of Food in the Last Year: Never true  Transportation Needs: No Transportation Needs   Lack of Transportation (Medical): No   Lack of Transportation (Non-Medical): No  Physical Activity: Inactive   Days of Exercise per Week: 0 days   Minutes of Exercise per Session: 0 min  Stress: No Stress Concern Present   Feeling of Stress : Not at all  Social Connections: Not on file   Intimate Partner Violence: Not on file    FAMILY HISTORY: Family History  Problem Relation Age of Onset   CVA Mother    Hypertension Brother    Hypertension Sister    Hypertension Brother    Stroke Sister    Heart attack Neg Hx     ALLERGIES:  has No Known Allergies.  MEDICATIONS:  Current Outpatient Medications  Medication Sig Dispense Refill   acetaminophen (TYLENOL) 650 MG CR tablet Take 650 mg by mouth every 8 (eight) hours as needed for pain.     atorvastatin (LIPITOR) 80 MG tablet TAKE 1 TABLET(80 MG) BY MOUTH DAILY AT 6 PM 90 tablet 1   ELIQUIS 2.5 MG TABS tablet TAKE 1 TABLET(2.5 MG) BY MOUTH TWICE DAILY 60 tablet 4   FEROSUL 325 (65 Fe) MG tablet TAKE 1 TABLET(325 MG) BY  MOUTH DAILY 90 tablet 1   LUMIGAN 0.01 % SOLN Place 1 drop into both eyes at bedtime.     pantoprazole (PROTONIX) 40 MG tablet TAKE 1 TABLET(40 MG) BY MOUTH TWICE DAILY 60 tablet 2   No current facility-administered medications for this visit.    REVIEW OF SYSTEMS:   Constitutional: ( - ) fevers, ( - )  chills , ( - ) night sweats Eyes: ( - ) blurriness of vision, ( - ) double vision, ( - ) watery eyes Ears, nose, mouth, throat, and face: ( - ) mucositis, ( - ) sore throat Respiratory: ( - ) cough, ( - ) dyspnea, ( - ) wheezes Cardiovascular: ( - ) palpitation, ( - ) chest discomfort, ( - ) lower extremity swelling Gastrointestinal:  ( - ) nausea, ( - ) heartburn, ( - ) change in bowel habits Skin: ( - ) abnormal skin rashes Lymphatics: ( - ) new lymphadenopathy, ( - ) easy bruising Neurological: ( - ) numbness, ( - ) tingling, ( - ) new weaknesses Behavioral/Psych: ( - ) mood change, ( - ) new changes  All other systems were reviewed with the patient and are negative.  PHYSICAL EXAMINATION:  Vitals:   03/05/21 1426  BP: (!) 173/64  Pulse: 61  Resp: 17  Temp: 97.9 F (36.6 C)  SpO2: 100%    Filed Weights   03/05/21 1426  Weight: 130 lb 3.2 oz (59.1 kg)     GENERAL: well appearing  elderly African American male alert, no distress and comfortable SKIN: skin color, texture, turgor are normal, no rashes or significant lesions EYES: conjunctiva are pink and non-injected, sclera clear LUNGS: clear to auscultation and percussion with normal breathing effort HEART: regular rate & rhythm and no murmurs and no lower extremity edema Musculoskeletal: no cyanosis of digits and no clubbing  PSYCH: alert & oriented x 3, fluent speech NEURO: no focal motor/sensory deficits  LABORATORY DATA:  I have reviewed the data as listed CBC Latest Ref Rng & Units 03/05/2021 03/03/2021 01/20/2021  WBC 4.0 - 10.5 K/uL 2.1(L) 2.5(L) 2.2(L)  Hemoglobin 13.0 - 17.0 g/dL 11.0(L) 11.6(L) 11.7(L)  Hematocrit 39.0 - 52.0 % 34.4(L) 37.1(L) 37.4(L)  Platelets 150 - 400 K/uL 77(L) 73(L) 78(L)    CMP Latest Ref Rng & Units 03/05/2021 01/20/2021 12/09/2020  Glucose 70 - 99 mg/dL 100(H) 97 75  BUN 8 - 23 mg/dL _0 Creatinine 0.61 - 1.24 mg/dL 1.69(H) 1.69(H) 1.57(H)  Sodium 135 - 145 mmol/L 138 137 139  Potassium 3.5 - 5.1 mmol/L 3.9 4.3 5.0  Chloride 98 - 111 mmol/L 105 107 105  CO2 22 - 32 mmol/L _1 Calcium 8.9 - 10.3 mg/dL 7.8(L) 7.9(L) 7.8(L)  Total Protein 6.5 - 8.1 g/dL 6.7 7.0 -  Total Bilirubin 0.3 - 1.2 mg/dL 1.1 0.8 -  Alkaline Phos 38 - 126 U/L 92 106 -  AST 15 - 41 U/L 20 26 -  ALT 0 - 44 U/L 12 24 -    Lab Results  Component Value Date   MPROTEIN Not Observed 08/23/2020   Lab Results  Component Value Date   KPAFRELGTCHN 61.1 (H) 08/23/2020   LAMBDASER 34.8 (H) 08/23/2020   KAPLAMBRATIO 1.76 (H) 08/23/2020     RADIOGRAPHIC STUDIES: US Abdomen Complete  Result Date: 02/12/2021 CLINICAL DATA:  Iron deficiency anemia secondary to chronic blood loss. EXAM: ABDOMEN ULTRASOUND COMPLETE COMPARISON:  None. FINDINGS: Gallbladder: Gallbladder polyp measuring 8 mm. No  gallstones or wall thickening visualized. No sonographic Murphy sign noted by sonographer. Common bile duct:  Diameter: 6 mm Liver: No focal lesion identified. Within normal limits in parenchymal echogenicity. Portal vein is patent on color Doppler imaging with normal direction of blood flow towards the liver. IVC: No abnormality visualized. Pancreas: Visualized portion unremarkable. Spleen: Size and appearance within normal limits. Right Kidney: Length: 10.6 cm. Echogenicity within normal limits. Anechoic 2.8 cm cyst with a single thin internal septation. No solid mass or hydronephrosis visualized. Left Kidney: Length: 9.1 cm. Echogenicity within normal limits. Anechoic 3.5 x 2.3 x 2.0 cm area extending from the medial aspect of the kidney at the renal hila, which demonstrates some internal echoes which may represent artifact. No mass or hydronephrosis visualized. Abdominal aorta: No aneurysm visualized. Other findings: None. IMPRESSION: 1. No ultrasound abnormality of the liver identified. Normal size spleen. 2. Gallbladder polyp measuring 8 mm. Yearly follow up with ultrasound to ensure no interval growth, suggested. Per consensus guidelines, this requires no additional evaluation or specific follow-up. This recommendation follows ACR consensus guidelines: White Paper of the ACR Incidental findings Committee II on Gallbladder and Biliary Findings. J Am Coll Radiol 2013:;10:953-956. 3. Anechoic 3.5 cm area extending from the medial aspect of the left kidney at the renal hila, which demonstrates some possible internal complexity. This is favored represent a renal sinus cysts or other exophytic renal cyst but is incompletely characterized on this examination, consider renal protocol abdominal MRI or CT with and without contrast for further evaluation. Electronically Signed   By: Dahlia Bailiff MD   On: 02/12/2021 09:19   CT Biopsy  Result Date: 03/03/2021 INDICATION: 86 year old male with new diagnosis of thrombocytopenia EXAM: CT GUIDED BONE MARROW ASPIRATION AND CORE BIOPSY MEDICATIONS: None. ANESTHESIA/SEDATION:  Moderate (conscious) sedation was employed during this procedure. A total of 0.5 milligrams versed and 50 micrograms fentanyl were administered intravenously. The patient's level of consciousness and vital signs were monitored continuously by radiology nursing throughout the procedure under my direct supervision. Total monitored sedation time: 4 minutes FLUOROSCOPY TIME:  CT dose (82.06 mGy cm). COMPLICATIONS: None immediate. Estimated blood loss: <5 mL PROCEDURE: Informed written consent was obtained from the patient after a thorough discussion of the procedural risks, benefits and alternatives. All questions were addressed. Maximal Sterile Barrier Technique was utilized including caps, mask, sterile gowns, sterile gloves, sterile drape, hand hygiene and skin antiseptic. A timeout was performed prior to the initiation of the procedure. The patient was positioned prone and non-contrast localization CT was performed of the pelvis to demonstrate the iliac marrow spaces. Maximal barrier sterile technique utilized including caps, mask, sterile gowns, sterile gloves, large sterile drape, hand hygiene, and Betadine prep. Under sterile conditions and local anesthesia, an 11 gauge coaxial bone biopsy needle was advanced into the right iliac marrow space. Needle position was confirmed with CT imaging. Initially, bone marrow aspiration was performed. Next, the 11 gauge outer cannula was utilized to obtain a right iliac bone marrow core biopsy. Needle was removed. Hemostasis was obtained with compression. The patient tolerated the procedure well. Samples were prepared with the cytotechnologist. IMPRESSION: Successful CT-guided bone marrow aspiration and biopsy, as above. Electronically Signed   By: Michaelle Birks MD   On: 03/03/2021 17:12   CT BONE MARROW BIOPSY & ASPIRATION  Result Date: 03/03/2021 INDICATION: 86 year old male with new diagnosis of thrombocytopenia EXAM: CT GUIDED BONE MARROW ASPIRATION AND CORE BIOPSY  MEDICATIONS: None. ANESTHESIA/SEDATION: Moderate (conscious) sedation was employed during this procedure. A  total of 0.5 milligrams versed and 50 micrograms fentanyl were administered intravenously. The patient's level of consciousness and vital signs were monitored continuously by radiology nursing throughout the procedure under my direct supervision. Total monitored sedation time: 4 minutes FLUOROSCOPY TIME:  CT dose (82.06 mGy cm). COMPLICATIONS: None immediate. Estimated blood loss: <5 mL PROCEDURE: Informed written consent was obtained from the patient after a thorough discussion of the procedural risks, benefits and alternatives. All questions were addressed. Maximal Sterile Barrier Technique was utilized including caps, mask, sterile gowns, sterile gloves, sterile drape, hand hygiene and skin antiseptic. A timeout was performed prior to the initiation of the procedure. The patient was positioned prone and non-contrast localization CT was performed of the pelvis to demonstrate the iliac marrow spaces. Maximal barrier sterile technique utilized including caps, mask, sterile gowns, sterile gloves, large sterile drape, hand hygiene, and Betadine prep. Under sterile conditions and local anesthesia, an 11 gauge coaxial bone biopsy needle was advanced into the right iliac marrow space. Needle position was confirmed with CT imaging. Initially, bone marrow aspiration was performed. Next, the 11 gauge outer cannula was utilized to obtain a right iliac bone marrow core biopsy. Needle was removed. Hemostasis was obtained with compression. The patient tolerated the procedure well. Samples were prepared with the cytotechnologist. IMPRESSION: Successful CT-guided bone marrow aspiration and biopsy, as above. Electronically Signed   By: Michaelle Birks MD   On: 03/03/2021 17:12     ASSESSMENT & PLAN [name] 86 y.o. male with medical history significant for iron deficiency anemia 2/2 to GI bleeding who presents for a  follow up visit.  After review the labs, the records, schedule the patient the findings most consistent with iron deficiency anemia due to GI bleeding which is properly responding to p.o. iron therapy.  His hemoglobin had increased to 11.7 and he has no overt signs or symptoms of anemia at this time.  I would recommend we continue with his p.o. iron therapy and have him return in 3 months time in order to assure that his stores are adequately repleted.  Unfortunately on exam today he was found to have a drop in his white blood cell count down to 2.1 and his platelet count down to 77.  The reason for this is not clear.  He did previously have borderline low levels of vitamin B12 but fortunately had a negative multiple myeloma panel.  Possible etiologies include nutritional deficiency, liver dysfunction, or bone marrow dysfunction.  Bone marrow biopsy performed on 03/04/2019 did not show any clear signs of abnormality.  At this time we will continue to monitor his cytopenias.  # Iron Deficiency Anemia in the Setting of GI Bleeding -- findings most consistent with iron deficiency anemia in the setting of GI bleed.   --Hgb stable at 11.0, recommend continue PO ferrous sulfate 335m PO daily.  -- no alternative sources of anemia noted on our workup.  -- May be a component of CKD contributing to the patient's anemia.  Can consider erythropoietin levels if iron deficiency does not adequately explain the extent of the patient's anemia. -- Additional nutritional labs with folic acid and vitamin B12 were WNL. -- if PO iron fails to correct his anemia my recommendation would be for IV Feraheme 510 mg q. 7 days x 2 doses -- Plan for return to clinic in 3 months time for continued monitoring.  #Thrombocytopenia #Leukopenia, Neutropenia -- Etiology is unclear.  The patient is always had borderline low levels of platelets and  white blood cell counts, however there has been a steady drop on his counts today --WBC  2.1, ANC 1.3, Plt 77 today.  -- full nutritional panel including vitamin B12, folate, methylmalonic acid, homocystine as well as copper has been negative  --negative HIV, hepatitis B and C panels --peripheral blood film shows no concerning findings.  --Ultrasound showed no clear evidence of liver disease or splenomegaly on 02/12/21 --because no clear etiology could found we pursued a bone marrow biopsy.  This has showed some mild dysplasia but no overt abnormalities. --Continue to monitor  No orders of the defined types were placed in this encounter.  All questions were answered. The patient knows to call the clinic with any problems, questions or concerns.  A total of more than 30 minutes were spent on this encounter and over half of that time was spent on counseling and coordination of care as outlined above.   Ledell Peoples, MD Department of Hematology/Oncology [place] [facility] Phone: [phone] Pager: [phone] Email: [email or web address]  03/06/2021 11:46 AM

## 2021-03-08 LAB — CUP PACEART REMOTE DEVICE CHECK
Date Time Interrogation Session: 20220729230248
Implantable Pulse Generator Implant Date: 20201014

## 2021-03-10 ENCOUNTER — Encounter (HOSPITAL_COMMUNITY): Payer: Self-pay | Admitting: Hematology and Oncology

## 2021-03-11 ENCOUNTER — Encounter (HOSPITAL_COMMUNITY): Payer: Self-pay | Admitting: Hematology and Oncology

## 2021-03-12 LAB — SURGICAL PATHOLOGY

## 2021-03-17 ENCOUNTER — Other Ambulatory Visit: Payer: Self-pay | Admitting: Nurse Practitioner

## 2021-03-17 MED ORDER — PANTOPRAZOLE SODIUM 40 MG PO TBEC: DELAYED_RELEASE_TABLET | ORAL | 5 refills | Status: DC

## 2021-03-18 ENCOUNTER — Other Ambulatory Visit: Payer: Self-pay

## 2021-03-18 MED ORDER — PANTOPRAZOLE SODIUM 40 MG PO TBEC: DELAYED_RELEASE_TABLET | ORAL | 2 refills | Status: DC

## 2021-03-25 ENCOUNTER — Telehealth: Payer: Medicare Other

## 2021-03-25 ENCOUNTER — Ambulatory Visit (INDEPENDENT_AMBULATORY_CARE_PROVIDER_SITE_OTHER): Payer: Medicare Other

## 2021-03-25 DIAGNOSIS — D509 Iron deficiency anemia, unspecified: Secondary | ICD-10-CM | POA: Diagnosis not present

## 2021-03-25 DIAGNOSIS — I480 Paroxysmal atrial fibrillation: Secondary | ICD-10-CM | POA: Diagnosis not present

## 2021-03-25 DIAGNOSIS — R7303 Prediabetes: Secondary | ICD-10-CM

## 2021-03-25 DIAGNOSIS — I10 Essential (primary) hypertension: Secondary | ICD-10-CM

## 2021-03-25 DIAGNOSIS — N183 Chronic kidney disease, stage 3 unspecified: Secondary | ICD-10-CM

## 2021-04-01 NOTE — Patient Instructions (Signed)
Goals Addressed      Absence of Fall and Fall-Related injury   On track    Timeframe:  Long-Range Goal Priority:  High Start Date:  11/29/20                           Expected End Date: 11/29/21  Next Follow Up date: 04/18/21  Self-Care Activities:  Continue to keep all scheduled follow up appointments Take medications as directed  Let your healthcare team know if you are unable to take your medications Call your pharmacy for refills at least 7 days prior to running out of medication Report falls to PCP provider promptly and or seek medical care right away if injured from fall  Patient Goals:  - Utilize (assistive device) appropriately with all ambulation - De-clutter walkways - Change positions slowly - Wear secure fitting shoes at all times with ambulation - Utilize home lighting for dim lit areas - Demonstrate self and pet awareness at all times                          Follow My Treatment Plan-Chronic Kidney   On track    Timeframe:  Long-Range Goal Priority:  High Start Date:  11/29/20                          Expected End Date: 11/29/21                     Follow Up Date: 04/18/21  Continue to adhere to MD recommendations for CKD  Continue to keep all scheduled follow up appointments Take medications as directed  Let your healthcare team know if you are unable to take your medications Call your pharmacy for refills at least 7 days prior to running out of medication Increase your water intake unless otherwise directed Review mailed printed educational materials related to Kidney disease   Why is this important?   Staying as healthy as you can is very important. This may mean making changes if you smoke, don't exercise or eat poorly.  A healthy lifestyle is an important goal for you.  Following the treatment plan and making changes may be hard.  Try some of these steps to help keep the disease from getting worse.     Notes:

## 2021-04-01 NOTE — Chronic Care Management (AMB) (Signed)
Chronic Care Management   CCM RN Visit Note  03/25/2021 Name: Trevor Murphy MRN: [id number] DOB: 1935/02/23  Subjective: Trevor Murphy is a 86 y.o. year old male who is a primary care patient of [name], [place]. The care management team was consulted for assistance with disease management and care coordination needs.    Engaged with patient by telephone for follow up visit in response to provider referral for case management and/or care coordination services.   Consent to Services:  The patient was given information about Chronic Care Management services, agreed to services, and gave verbal consent prior to initiation of services.  Please see initial visit note for detailed documentation.   Patient agreed to services and verbal consent obtained.   Assessment: Review of patient past medical history, allergies, medications, health status, including review of consultants reports, laboratory and other test data, was performed as part of comprehensive evaluation and provision of chronic care management services.   SDOH (Social Determinants of Health) assessments and interventions performed:  Yes, no acute needs   CCM Care Plan  No Known Allergies  Outpatient Encounter Medications as of 03/25/2021  Medication Sig   acetaminophen (TYLENOL) 650 MG CR tablet Take 650 mg by mouth every 8 (eight) hours as needed for pain.   atorvastatin (LIPITOR) 80 MG tablet TAKE 1 TABLET(80 MG) BY MOUTH DAILY AT 6 PM   ELIQUIS 2.5 MG TABS tablet TAKE 1 TABLET(2.5 MG) BY MOUTH TWICE DAILY   FEROSUL 325 (65 Fe) MG tablet TAKE 1 TABLET(325 MG) BY MOUTH DAILY   LUMIGAN 0.01 % SOLN Place 1 drop into both eyes at bedtime.   pantoprazole (PROTONIX) 40 MG tablet TAKE 1 TABLET(40 MG) BY MOUTH TWICE DAILY   No facility-administered encounter medications on file as of 03/25/2021.    Patient Active Problem List   Diagnosis Date Noted   Mixed hyperlipidemia 08/12/2020   Acute blood loss anemia 08/11/2020    Paroxysmal atrial fibrillation ([place]) 06/07/2020   Secondary hypercoagulable state ([place]) 06/07/2020   Ischemic cerebrovascular accident (CVA) of frontal lobe ([place]) 05/20/2019   Pain due to onychomycosis of toenails of both feet 01/20/2019   Prediabetes 07/22/2018   Nail abnormality 07/22/2018   Sinus bradycardia 05/02/2016   Hypotension due to drugs 05/02/2016   History of tachycardia 05/02/2016   GERD (gastroesophageal reflux disease) 02/05/2015   Coronary artery disease involving native coronary artery of native heart without angina pectoris 04/20/2014   Atypical chest pain 04/12/2014   Hypertensive urgency 03/11/2014   Chronic diastolic CHF (congestive heart failure) ([place]) 01/30/2014   Alcohol abuse 01/19/2014   Elevated troponin level not due myocardial infarction 12/19/2013   Gout 12/19/2013   Dyslipidemia 12/16/2013   Hypertensive crisis 12/16/2013   Cerebral infarction ([place]) 07/28/2012   Acute on chronic renal failure ([place]) 07/27/2012   Essential hypertension 07/26/2012   Chronic kidney disease, stage 3b ([place]) 07/26/2012   Poor circulation    Recurrent ventral hernia 02/16/2012   Hernia (acquired) (recurrent) 02/10/2011    Conditions to be addressed/monitored: Paroxysmal atrial fibrillation, Iron deficiency anemia, Essential Hypertension, Prediabetes, Stage 3 Chronic Kidney disease    Care Plan : Chronic Kidney (Adult)  Updates made by Lynne Logan, RN since 03/25/2021 12:00 AM     Problem: Disease Progression   Priority: High     Long-Range Goal: Disease Progression Prevented or Minimized   Start Date: 11/29/2020  Expected End Date: 11/29/2021  Recent Progress: On track  Priority: High  Note:  Current Barriers:  Ineffective Self Health Maintenance  Clinical Goal(s):  Collaboration with [name], FNP regarding development and update of comprehensive plan of care as evidenced by provider attestation and co-signature Inter-disciplinary care team collaboration  (see longitudinal plan of care) patient will work with care management team to address care coordination and chronic disease management needs related to Disease Management Educational Needs Care Coordination Medication Management and Education Psychosocial Support   Interventions:  03/25/21 completed successful outbound call with sister [name]  Evaluation of current treatment plan related to  CKD 3b  self-management and patient's adherence to plan as established by provider. Collaboration with [name], FNP regarding development and update of comprehensive plan of care as evidenced by provider attestation       and co-signature Inter-disciplinary care team collaboration (see longitudinal plan of care) Provided education to patient about basic disease process related to CKD  Review of patient status, including review of consultants reports, relevant laboratory and other test results, and medications completed. Reviewed medications with patient and discussed importance of medication adherence Educated patient on dietary and educational recommendations; Educated on importance of increasing water to 64 oz per day unless otherwise directed  Mailed printed educational material related to Stages of Chronic Kidney disease; Eating Right with Kidney disease  Discussed plans with patient for ongoing care management follow up and provided patient with direct contact information for care management team Self Care Activities:  Continue to adhere to MD recommendations for CKD  Continue to keep all scheduled follow up appointments Take medications as directed  Let your healthcare team know if you are unable to take your medications Call your pharmacy for refills at least 7 days prior to running out of medication Increase your water intake unless otherwise directed Review mailed printed educational materials related to Kidney disease Patient Goals: - increase water to 64 oz per day    Follow Up Plan:  Telephone follow up appointment with care management team member scheduled for: 04/18/21     Care Plan : Fall Risk (Adult)  Updates made by Lynne Logan, RN since 03/25/2021 12:00 AM     Problem: Fall Risk   Priority: High     Long-Range Goal: Absence of Fall and Fall-Related Injury   Start Date: 11/29/2020  Expected End Date: 11/29/2021  Recent Progress: On track  Priority: High  Note:   Current Barriers:  Knowledge Deficits related to fall precautions in patient with Paroxysmal atrial fibrillation, Iron deficiency anemia, Essential Hypertension, Prediabetes  Decreased adherence to prescribed treatment for fall prevention Self Care deficits related to impaired gait  Chronic Disease Management support and education needs related to Impaired Physical Mobility/Gait  Clinical Goal(s):  patient will demonstrate improved adherence to prescribed treatment plan for decreasing falls as evidenced by patient reporting and review of EMR patient will verbalize using fall risk reduction strategies discussed patient will not experience additional falls Interventions:  03/25/21 completed successful outbound call with sister [name]  Collaboration with [name], [place] regarding development and update of comprehensive plan of care as evidenced by provider attestation and co-signature Inter-disciplinary care team collaboration (see longitudinal plan of care) Evaluation of current treatment plan related to and patient's adherence to plan as established by provider. Provided written and verbal education re: Potential causes of falls and Fall prevention strategies Reviewed medications and discussed potential side effects of medications such as dizziness and frequent urination Assessed for s/s of orthostatic hypotension Assessed patients knowledge of fall risk prevention secondary to previously  provided education. Assessed working status of life alert bracelet and patient adherence Mailed printed  educational material related to Falls  Discussed plans with patient for ongoing care management follow up and provided patient with direct contact information for care management team Self-Care Activities:  Continue to keep all scheduled follow up appointments Take medications as directed  Let your healthcare team know if you are unable to take your medications Call your pharmacy for refills at least 7 days prior to running out of medication Report falls to PCP provider promptly and or seek medical care right away if injured from fall  Patient Goals:  - Utilize (assistive device) appropriately with all ambulation - De-clutter walkways - Change positions slowly - Wear secure fitting shoes at all times with ambulation - Utilize home lighting for dim lit areas - Demonstrate self and pet awareness at all times  Follow Up Plan: Telephone follow up appointment with care management team member scheduled for: 04/18/21     Plan:Telephone follow up appointment with care management team member scheduled for:  04/18/21  Barb Merino, RN, BSN, CCM Care Management Coordinator [facility]  Direct Phone: [phone]

## 2021-04-01 NOTE — Progress Notes (Signed)
Carelink Summary Report / Loop Recorder 

## 2021-04-07 ENCOUNTER — Ambulatory Visit (INDEPENDENT_AMBULATORY_CARE_PROVIDER_SITE_OTHER): Payer: Medicare Other

## 2021-04-07 DIAGNOSIS — I639 Cerebral infarction, unspecified: Secondary | ICD-10-CM

## 2021-04-10 LAB — CUP PACEART REMOTE DEVICE CHECK
Date Time Interrogation Session: 20220901083506
Implantable Pulse Generator Implant Date: 20201014

## 2021-04-13 ENCOUNTER — Other Ambulatory Visit (HOSPITAL_COMMUNITY): Payer: Self-pay | Admitting: Physician Assistant

## 2021-04-15 ENCOUNTER — Other Ambulatory Visit: Payer: Self-pay

## 2021-04-15 ENCOUNTER — Encounter: Payer: Self-pay | Admitting: Nurse Practitioner

## 2021-04-15 ENCOUNTER — Other Ambulatory Visit (HOSPITAL_COMMUNITY): Payer: Self-pay | Admitting: Physician Assistant

## 2021-04-15 ENCOUNTER — Ambulatory Visit (INDEPENDENT_AMBULATORY_CARE_PROVIDER_SITE_OTHER): Payer: Medicare Other | Admitting: Nurse Practitioner

## 2021-04-15 VITALS — BP 142/80 | HR 70 | Temp 98.4°F | Ht 65.0 in | Wt 130.2 lb

## 2021-04-15 DIAGNOSIS — I10 Essential (primary) hypertension: Secondary | ICD-10-CM | POA: Diagnosis not present

## 2021-04-15 DIAGNOSIS — R8290 Unspecified abnormal findings in urine: Secondary | ICD-10-CM | POA: Diagnosis not present

## 2021-04-15 DIAGNOSIS — E782 Mixed hyperlipidemia: Secondary | ICD-10-CM | POA: Diagnosis not present

## 2021-04-15 DIAGNOSIS — R7303 Prediabetes: Secondary | ICD-10-CM

## 2021-04-15 DIAGNOSIS — Z0000 Encounter for general adult medical examination without abnormal findings: Secondary | ICD-10-CM

## 2021-04-15 DIAGNOSIS — Z2821 Immunization not carried out because of patient refusal: Secondary | ICD-10-CM

## 2021-04-15 LAB — POCT URINALYSIS DIPSTICK
Bilirubin, UA: NEGATIVE
Glucose, UA: NEGATIVE
Ketones, UA: NEGATIVE
Leukocytes, UA: NEGATIVE
Nitrite, UA: NEGATIVE
Protein, UA: POSITIVE — AB
Spec Grav, UA: >=1.03 — AB (ref 1.010–1.025)
Urobilinogen, UA: 0.2 E.U./dL
pH, UA: 5.5 (ref 5.0–8.0)

## 2021-04-15 NOTE — Patient Instructions (Signed)
Health Maintenance, Male Adopting a healthy lifestyle and getting preventive care are important in promoting health and wellness. Ask your health care provider about: The right schedule for you to have regular tests and exams. Things you can do on your own to prevent diseases and keep yourself healthy. What should I know about diet, weight, and exercise? Eat a healthy diet  Eat a diet that includes plenty of vegetables, fruits, low-fat dairy products, and lean protein. Do not eat a lot of foods that are high in solid fats, added sugars, or sodium. Maintain a healthy weight Body mass index (BMI) is a measurement that can be used to identify possible weight problems. It estimates body fat based on height and weight. Your health care provider can help determine your BMI and help you achieve or maintain a healthy weight. Get regular exercise Get regular exercise. This is one of the most important things you can do for your health. Most adults should: Exercise for at least 150 minutes each week. The exercise should increase your heart rate and make you sweat (moderate-intensity exercise). Do strengthening exercises at least twice a week. This is in addition to the moderate-intensity exercise. Spend less time sitting. Even light physical activity can be beneficial. Watch cholesterol and blood lipids Have your blood tested for lipids and cholesterol at 84 years of age, then have this test every 5 years. You may need to have your cholesterol levels checked more often if: Your lipid or cholesterol levels are high. You are older than 84 years of age. You are at high risk for heart disease. What should I know about cancer screening? Many types of cancers can be detected early and may often be prevented. Depending on your health history and family history, you may need to have cancer screening at various ages. This may include screening for: Colorectal cancer. Prostate cancer. Skin cancer. Lung  cancer. What should I know about heart disease, diabetes, and high blood pressure? Blood pressure and heart disease High blood pressure causes heart disease and increases the risk of stroke. This is more likely to develop in people who have high blood pressure readings, are of African descent, or are overweight. Talk with your health care provider about your target blood pressure readings. Have your blood pressure checked: Every 3-5 years if you are 18-39 years of age. Every year if you are 40 years old or older. If you are between the ages of 65 and 75 and are a current or former smoker, ask your health care provider if you should have a one-time screening for abdominal aortic aneurysm (AAA). Diabetes Have regular diabetes screenings. This checks your fasting blood sugar level. Have the screening done: Once every three years after age 45 if you are at a normal weight and have a low risk for diabetes. More often and at a younger age if you are overweight or have a high risk for diabetes. What should I know about preventing infection? Hepatitis B If you have a higher risk for hepatitis B, you should be screened for this virus. Talk with your health care provider to find out if you are at risk for hepatitis B infection. Hepatitis C Blood testing is recommended for: Everyone born from 1945 through 1965. Anyone with known risk factors for hepatitis C. Sexually transmitted infections (STIs) You should be screened each year for STIs, including gonorrhea and chlamydia, if: You are sexually active and are younger than 84 years of age. You are older than 84 years   of age and your health care provider tells you that you are at risk for this type of infection. Your sexual activity has changed since you were last screened, and you are at increased risk for chlamydia or gonorrhea. Ask your health care provider if you are at risk. Ask your health care provider about whether you are at high risk for HIV.  Your health care provider may recommend a prescription medicine to help prevent HIV infection. If you choose to take medicine to prevent HIV, you should first get tested for HIV. You should then be tested every 3 months for as long as you are taking the medicine. Follow these instructions at home: Lifestyle Do not use any products that contain nicotine or tobacco, such as cigarettes, e-cigarettes, and chewing tobacco. If you need help quitting, ask your health care provider. Do not use street drugs. Do not share needles. Ask your health care provider for help if you need support or information about quitting drugs. Alcohol use Do not drink alcohol if your health care provider tells you not to drink. If you drink alcohol: Limit how much you have to 0-2 drinks a day. Be aware of how much alcohol is in your drink. In the [place] one drink equals one 12 oz bottle of beer (355 mL), one 5 oz glass of wine (148 mL), or one 1 oz glass of hard liquor (44 mL). General instructions Schedule regular health, dental, and eye exams. Stay current with your vaccines. Tell your health care provider if: You often feel depressed. You have ever been abused or do not feel safe at home. Summary Adopting a healthy lifestyle and getting preventive care are important in promoting health and wellness. Follow your health care provider's instructions about healthy diet, exercising, and getting tested or screened for diseases. Follow your health care provider's instructions on monitoring your cholesterol and blood pressure. This information is not intended to replace advice given to you by your health care provider. Make sure you discuss any questions you have with your health care provider. Document Revised: 10/04/2020 Document Reviewed: 07/20/2018 Elsevier Patient Education  2022 Elsevier Inc.  

## 2021-04-15 NOTE — Progress Notes (Signed)
I,Yamilka Roman [employer] as a Education administrator for [employer], FNP.,have documented all relevant documentation on the behalf of [name], FNP,as directed by  [name], FNP while in the presence of [name], [place].   This visit occurred during the SARS-CoV-2 public health emergency.  Safety protocols were in place, including screening questions prior to the visit, additional usage of staff PPE, and extensive cleaning of exam room while observing appropriate contact time as indicated for disinfecting solutions.  Subjective:     Patient ID: Trevor Murphy , male    DOB: 07/15/1935 , 86 y.o.   MRN: [id number]   Chief Complaint  Patient presents with   Annual Exam    HPI  Patient presents today for his HM. He is accompanied by his Sister.   Wt Readings from Last 3 Encounters: 04/15/21 : 130 lb 3.2 oz (59.1 kg) 03/05/21 : 130 lb 3.2 oz (59.1 kg) 01/20/21 : 128 lb (58.1 kg)       Past Medical History:  Diagnosis Date   Arthritis    CAD (coronary artery disease)    a. Cath 08/2009: minimal nonobstructive disease (25% LAD). b. Lexiscan nuc 6/215: EF 54%, no ischemia or infarction.   Chronic diastolic CHF (congestive heart failure) ([place])    a. Echo 2013: EF 55-60%, mild AI, mild MR. b. Echo 5/15 with EF 60-65%, no WMA, mild LVH, mild AI.   CKD (chronic kidney disease) stage 3, GFR 30-59 ml/min (HCC) 07/26/2012   a. Felt likely due to HTN.   CVA (cerebral infarction) 07/28/2012   Glaucoma    Gout    H/O umbilical hernia repair    Headache(784.0)    " WHEN MY BLOOD PRESSURE IS UP"   History of acute renal failure    History of alcohol abuse    HTN (hypertension)    Hypertension    a. Patent renal arteries 2005.   Noncompliance    OA (osteoarthritis)    Other and unspecified hyperlipidemia    Poor circulation    Sinus bradycardia    SVT (supraventricular tachycardia) ([place])    a. Possible AVNRT.  Got adenosine from EMS in 2015 for SVT.  During 5/15 hospitalization had  episode of short R-P tachycardia (possible AVNRT) terminated by adenosine.  b. 04/2016 - recurrent tachycardia (SVT vs. AVNRT), again terminated by Adenosine   Visual impairment      Family History  Problem Relation Age of Onset   CVA Mother    Hypertension Brother    Hypertension Sister    Hypertension Brother    Stroke Sister    Heart attack Neg Hx      Current Outpatient Medications:    acetaminophen (TYLENOL) 650 MG CR tablet, Take 650 mg by mouth every 8 (eight) hours as needed for pain., Disp: , Rfl:    atorvastatin (LIPITOR) 80 MG tablet, TAKE 1 TABLET(80 MG) BY MOUTH DAILY AT 6 PM, Disp: 90 tablet, Rfl: 1   ELIQUIS 2.5 MG TABS tablet, TAKE 1 TABLET(2.5 MG) BY MOUTH TWICE DAILY, Disp: 60 tablet, Rfl: 11   FEROSUL 325 (65 Fe) MG tablet, TAKE 1 TABLET(325 MG) BY MOUTH DAILY, Disp: 90 tablet, Rfl: 1   LUMIGAN 0.01 % SOLN, Place 1 drop into both eyes at bedtime., Disp: , Rfl:    pantoprazole (PROTONIX) 40 MG tablet, TAKE 1 TABLET(40 MG) BY MOUTH TWICE DAILY, Disp: 60 tablet, Rfl: 2   No Known Allergies   Men's preventive visit. Patient Health Questionnaire (PHQ-2) is  Flowsheet Row Clinical Support from 09/12/2020 in [facility]  PHQ-2 Total Score 0      Patient is on a Regular diet. He does admit to eating junk food. Exercise - by walking 5-10 minutes a day, does admit to not doing more than he has to .Marital status: Married. Relevant history for alcohol use is:  Social History   Substance and Sexual Activity  Alcohol Use No   Alcohol/week: 1.0 standard drink   Types: 1 Cans of beer per week   Comment: h/o heavy use, quit in 2017   Relevant history for tobacco use is:  Social History   Tobacco Use  Smoking Status Never  Smokeless Tobacco Never  .   Review of Systems  Constitutional: Negative.   HENT: Negative.    Eyes: Negative.   Respiratory: Negative.    Cardiovascular: Negative.   Gastrointestinal: Negative.   Endocrine: Negative.    Genitourinary: Negative.   Musculoskeletal: Negative.   Skin: Negative.   Allergic/Immunologic: Negative.   Neurological: Negative.   Hematological: Negative.   Psychiatric/Behavioral: Negative.      Today's Vitals   04/15/21 1043  BP: (!) 142/80  Pulse: 70  Temp: 98.4 F (36.9 C)  SpO2: 97%  Weight: 130 lb 3.2 oz (59.1 kg)  Height: 5\' 5"  (1.651 m)  PainSc: 0-No pain   Body mass index is 21.67 kg/m.   Objective:  Physical Exam Vitals reviewed.  Constitutional:      General: He is not in acute distress.    Appearance: Normal appearance.  HENT:     Head: Normocephalic and atraumatic.     Right Ear: Tympanic membrane, ear canal and external ear normal. There is no impacted cerumen.     Left Ear: Tympanic membrane, ear canal and external ear normal. There is no impacted cerumen.     Nose:     Comments: Deferred - masked    Mouth/Throat:     Comments: Deferred - masked Eyes:     Extraocular Movements: Extraocular movements intact.     Pupils: Pupils are equal, round, and reactive to light.  Cardiovascular:     Rate and Rhythm: Normal rate and regular rhythm.     Pulses: Normal pulses.     Heart sounds: Normal heart sounds. No murmur heard. Pulmonary:     Effort: Pulmonary effort is normal. No respiratory distress.     Breath sounds: Normal breath sounds. No wheezing.  Abdominal:     General: Abdomen is flat. Bowel sounds are normal. There is no distension.     Palpations: Abdomen is soft.     Tenderness: There is no abdominal tenderness.  Genitourinary:    Prostate: Normal.     Rectum: Guaiac result negative.  Musculoskeletal:        General: Normal range of motion.     Cervical back: Normal range of motion and neck supple.     Comments: Using cane for ambulation  Skin:    General: Skin is warm and dry.     Capillary Refill: Capillary refill takes less than 2 seconds.  Neurological:     General: No focal deficit present.     Mental Status: He is alert and  oriented to person, place, and time.     Cranial Nerves: No cranial nerve deficit.     Motor: No weakness.  Psychiatric:        Mood and Affect: Mood normal.        Behavior: Behavior normal.  Thought Content: Thought content normal.        Judgment: Judgment normal.        Assessment And Plan:    1. Encounter for general adult medical examination w/o abnormal findings Behavior modifications discussed and diet history reviewed.   Pt will continue to exercise regularly and modify diet with low GI, plant based foods and decrease intake of processed foods.  Recommend intake of daily multivitamin, Vitamin D, and calcium.  Recommend for preventive screenings, as well as recommend immunizations that include influenza, TDAP, and Shingles (declines)   2. Essential hypertension Comments: Fairly elevated at this time  Encouraged to stay well hydrated with water.  No changes at this time - POCT Urinalysis Dipstick (81002)  3. Prediabetes Comments: Stable, continue to avoid sugary foods and drinks.  No current medications - Microalbumin / Creatinine Urine Ratio - Hemoglobin A1c  4. Mixed hyperlipidemia Comments: Well controlled at this time.  Continue current medications.  - Lipid panel  5. Influenza vaccination declined Comments: Declines influenza and shingrix vaccine  6. Abnormal urine Comments: Had blood clot in his urine will send for culture - Urine Culture    Patient was given opportunity to ask questions. Patient verbalized understanding of the plan and was able to repeat key elements of the plan. All questions were answered to their satisfaction.   [name], FNP   I, [name], FNP, have reviewed all documentation for this visit. The documentation on 04/15/21 for the exam, diagnosis, procedures, and orders are all accurate and complete.   THE PATIENT IS ENCOURAGED TO PRACTICE SOCIAL DISTANCING DUE TO THE COVID-19 PANDEMIC.

## 2021-04-16 LAB — MICROALBUMIN / CREATININE URINE RATIO
Creatinine, Urine: 150.5 mg/dL
Microalb/Creat Ratio: 261 mg/g creat — ABNORMAL HIGH (ref 0–29)
Microalbumin, Urine: 392.2 ug/mL

## 2021-04-16 LAB — LIPID PANEL
Chol/HDL Ratio: 2.3 ratio (ref 0.0–5.0)
Cholesterol, Total: 76 mg/dL — ABNORMAL LOW (ref 100–199)
HDL: 33 mg/dL — ABNORMAL LOW (ref >39)
LDL Chol Calc (NIH): 28 mg/dL (ref 0–99)
Triglycerides: 67 mg/dL (ref 0–149)
VLDL Cholesterol Cal: 15 mg/dL (ref 5–40)

## 2021-04-16 LAB — URINE CULTURE

## 2021-04-16 LAB — HEMOGLOBIN A1C
Est. average glucose Bld gHb Est-mCnc: 108 mg/dL
Hgb A1c MFr Bld: 5.4 % (ref 4.8–5.6)

## 2021-04-18 ENCOUNTER — Telehealth: Payer: Medicare Other

## 2021-04-18 NOTE — Progress Notes (Signed)
Carelink Summary Report / Loop Recorder 

## 2021-05-07 ENCOUNTER — Telehealth: Payer: Medicare Other

## 2021-05-07 ENCOUNTER — Telehealth: Payer: Self-pay

## 2021-05-07 ENCOUNTER — Encounter (INDEPENDENT_AMBULATORY_CARE_PROVIDER_SITE_OTHER): Payer: Medicare Other | Admitting: Ophthalmology

## 2021-05-07 ENCOUNTER — Other Ambulatory Visit: Payer: Self-pay

## 2021-05-07 DIAGNOSIS — I10 Essential (primary) hypertension: Secondary | ICD-10-CM | POA: Diagnosis not present

## 2021-05-07 DIAGNOSIS — H353111 Nonexudative age-related macular degeneration, right eye, early dry stage: Secondary | ICD-10-CM | POA: Diagnosis not present

## 2021-05-07 DIAGNOSIS — H43811 Vitreous degeneration, right eye: Secondary | ICD-10-CM | POA: Diagnosis not present

## 2021-05-07 DIAGNOSIS — H35031 Hypertensive retinopathy, right eye: Secondary | ICD-10-CM | POA: Diagnosis not present

## 2021-05-07 DIAGNOSIS — H2512 Age-related nuclear cataract, left eye: Secondary | ICD-10-CM

## 2021-05-07 NOTE — Telephone Encounter (Signed)
  Care Management   Follow Up Note   05/07/2021 Name: Trevor Murphy MRN: [id number] DOB: 1935-07-16   Referred by: [name], FNP Reason for referral : Chronic Care Management (RN CM Follow up call )   An unsuccessful telephone outreach was attempted today. The patient was referred to the case management team for assistance with care management and care coordination.   Follow Up Plan: A HIPPA compliant phone message was left for the patient providing contact information and requesting a return call.   Barb Merino, RN, BSN, CCM Care Management Coordinator [place]  Direct Phone: [phone]

## 2021-05-08 ENCOUNTER — Ambulatory Visit (INDEPENDENT_AMBULATORY_CARE_PROVIDER_SITE_OTHER): Payer: Medicare Other

## 2021-05-08 DIAGNOSIS — I639 Cerebral infarction, unspecified: Secondary | ICD-10-CM | POA: Diagnosis not present

## 2021-05-13 LAB — CUP PACEART REMOTE DEVICE CHECK
Date Time Interrogation Session: 20221003230616
Implantable Pulse Generator Implant Date: 20201014

## 2021-05-19 NOTE — Progress Notes (Signed)
Carelink Summary Report / Loop Recorder 

## 2021-05-20 ENCOUNTER — Telehealth: Payer: Medicare Other

## 2021-05-20 DIAGNOSIS — H35033 Hypertensive retinopathy, bilateral: Secondary | ICD-10-CM | POA: Diagnosis not present

## 2021-05-20 DIAGNOSIS — H5442A3 Blindness left eye category 3, normal vision right eye: Secondary | ICD-10-CM | POA: Diagnosis not present

## 2021-05-20 DIAGNOSIS — H401133 Primary open-angle glaucoma, bilateral, severe stage: Secondary | ICD-10-CM | POA: Diagnosis not present

## 2021-05-20 DIAGNOSIS — H34812 Central retinal vein occlusion, left eye, with macular edema: Secondary | ICD-10-CM | POA: Diagnosis not present

## 2021-05-20 DIAGNOSIS — H4323 Crystalline deposits in vitreous body, bilateral: Secondary | ICD-10-CM | POA: Diagnosis not present

## 2021-05-26 ENCOUNTER — Other Ambulatory Visit: Payer: Self-pay

## 2021-05-26 ENCOUNTER — Ambulatory Visit (INDEPENDENT_AMBULATORY_CARE_PROVIDER_SITE_OTHER): Payer: Medicare Other | Admitting: Podiatry

## 2021-05-26 ENCOUNTER — Encounter: Payer: Self-pay | Admitting: Podiatry

## 2021-05-26 DIAGNOSIS — I739 Peripheral vascular disease, unspecified: Secondary | ICD-10-CM | POA: Diagnosis not present

## 2021-05-26 DIAGNOSIS — N183 Chronic kidney disease, stage 3 unspecified: Secondary | ICD-10-CM | POA: Diagnosis not present

## 2021-05-26 DIAGNOSIS — M79675 Pain in left toe(s): Secondary | ICD-10-CM | POA: Diagnosis not present

## 2021-05-26 DIAGNOSIS — M79674 Pain in right toe(s): Secondary | ICD-10-CM | POA: Diagnosis not present

## 2021-05-26 DIAGNOSIS — B351 Tinea unguium: Secondary | ICD-10-CM

## 2021-05-26 NOTE — Progress Notes (Signed)
This patient returns to my office for at risk foot care.  This patient requires this care by a professional since this patient will be at risk due to having chronic kidney disease and coagulation defect.  Patient is taking eliquis.   This patient is unable to cut nails himself since the patient cannot reach his nails.These nails are painful walking and wearing shoes.  This patient presents for at risk foot care today.  General Appearance  Alert, conversant and in no acute stress.  Vascular  Dorsalis pedis and posterior tibial  pulses are weakly  palpable  bilaterally.  Capillary return is within normal limits  Bilaterally. Cold feet bilaterally. Absent digital hair  B/L.  Neurologic  Senn-Weinstein monofilament wire test within normal limits  bilaterally. Muscle power within normal limits bilaterally.  Nails Thick disfigured discolored nails with subungual debris  from hallux to fifth toes bilaterally. No evidence of bacterial infection or drainage bilaterally.  Orthopedic  No limitations of motion  feet .  No crepitus or effusions noted.  No bony pathology or digital deformities noted.  HAV  B/L.  Skin  normotropic skin with no porokeratosis noted bilaterally.  No signs of infections or ulcers noted.     Onychomycosis  Pain in right toes  Pain in left toes  Consent was obtained for treatment procedures.   Mechanical debridement of nails 1-5  bilaterally performed with a nail nipper.  Filed with dremel without incident.  Cauterized fourth toe left foot.   Return office visit     10  weeks                Told patient to return for periodic foot care and evaluation due to potential at risk complications.   Jasen Hartstein DPM  

## 2021-05-28 ENCOUNTER — Ambulatory Visit (INDEPENDENT_AMBULATORY_CARE_PROVIDER_SITE_OTHER): Payer: Medicare Other

## 2021-05-28 DIAGNOSIS — N183 Chronic kidney disease, stage 3 unspecified: Secondary | ICD-10-CM

## 2021-05-28 DIAGNOSIS — I10 Essential (primary) hypertension: Secondary | ICD-10-CM

## 2021-05-28 DIAGNOSIS — R7303 Prediabetes: Secondary | ICD-10-CM

## 2021-05-28 DIAGNOSIS — I480 Paroxysmal atrial fibrillation: Secondary | ICD-10-CM

## 2021-05-28 DIAGNOSIS — D509 Iron deficiency anemia, unspecified: Secondary | ICD-10-CM

## 2021-05-28 NOTE — Patient Instructions (Signed)
Visit Information   Consent to CCM Services: Trevor Murphy was given information about Chronic Care Management services including:  CCM service includes personalized support from designated clinical staff supervised by his physician, including individualized plan of care and coordination with other care providers 24/7 contact phone numbers for assistance for urgent and routine care needs. Service will only be billed when office clinical staff spend 20 minutes or more in a month to coordinate care. Only one practitioner may furnish and bill the service in a calendar month. The patient may stop CCM services at any time (effective at the end of the month) by phone call to the office staff. The patient will be responsible for cost sharing (co-pay) of up to 20% of the service fee (after annual deductible is met).  Patient agreed to services and verbal consent obtained.   The patient verbalized understanding of instructions, educational materials, and care plan provided today and declined offer to receive copy of patient instructions, educational materials, and care plan.   Telephone follow up appointment with care management team member scheduled for: 08/21/21  Barb Merino, RN, BSN, CCM Care Management Coordinator [facility]  Direct Phone: [phone]    CLINICAL CARE PLAN:  Patient Care Plan: RN Care Manager Plan of Care     Problem Identified: Chronic disease education and Care Coordination needs for Paroxysmal atrial fibrillation, Iron deficiency anemia, Essential Hypertension, Prediabetes, Stage 3 Chronic Kidney disease   Priority: High     Long-Range Goal: Assist with Chronic disease management and Care Coordination needs for Paroxysmal atrial fibrillation, Iron deficiency anemia, Essential Hypertension, Prediabetes, Stage 3 Chronic Kidney disease   Start Date: 05/28/2021  Expected End Date: 05/28/2022  This Visit's Progress: On track   Priority: High  Note:   Current Barriers:  Knowledge Deficits related to plan of care for management of Paroxysmal atrial fibrillation, Iron deficiency anemia, Essential Hypertension, Prediabetes, Stage 3 Chronic Kidney disease   Chronic Disease Management support and education needs related to Paroxysmal atrial fibrillation, Iron deficiency anemia, Essential Hypertension, Prediabetes, Stage 3 Chronic Kidney disease   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   Interventions: 1:1 collaboration with primary care provider regarding development and update of comprehensive plan of care as evidenced by provider attestation and co-signature Inter-disciplinary care team collaboration (see longitudinal plan of care) Evaluation of current treatment plan related to  self management and patient's adherence to plan as established by provider  Falls Interventions: Provided written and verbal education re: potential causes of falls and Fall prevention strategies; Advised patient of importance of notifying provider of falls; Assessed for falls since last encounter; Assessed patients knowledge of fall risk prevention secondary to previously provided education; Assessed for DME needs, patient is using a rollator for mobility at all times   Chronic Kidney Disease Interventions:  (Status:  Goal on track:  Yes Assessed the Designated Party Release [name]  understanding of chronic kidney disease    Evaluation of current treatment plan related to chronic kidney disease  self management and patient's adherence to plan as established by  provider      Reviewed prescribed diet increase water to 64 oz daily  Reviewed medications with patient and discussed importance of compliance    Advised patient, providing education and rationale, to monitor blood pressure daily and record, calling PCP for findings outside established parameters    Discussed plans with patient for ongoing care management follow up and provided patient with direct contact information for care management team    Last practice recorded BP readings:  BP Readings from Last 3 Encounters:  04/15/21 (!) 142/80  03/05/21 (!) 173/64  03/03/21 (!) 172/77  Most recent eGFR/CrCl:  Lab Results  Component Value Date   EGFR 43 (L) 12/09/2020    No components found for: CRCL  Patient Goals/Self-Care Activities: Patient will self administer medications as prescribed Patient will attend all scheduled provider appointments Patient will call pharmacy for medication refills Patient will call provider office for new concerns or questions  Follow Up Plan:  Telephone follow up appointment with care management team member scheduled for:  08/21/21

## 2021-05-28 NOTE — Chronic Care Management (AMB) (Signed)
Chronic Care Management   CCM RN Visit Note  05/28/2021 Name: Trevor Murphy MRN: [id number] DOB: 09/25/1934  Subjective: Trevor Murphy is a 86 y.o. year old male who is a primary care patient of [name], [place]. The care management team was consulted for assistance with disease management and care coordination needs.    Engaged with patient by telephone for follow up visit in response to provider referral for case management and/or care coordination services.   Consent to Services:  The patient was given information about Chronic Care Management services, agreed to services, and gave verbal consent prior to initiation of services.  Please see initial visit note for detailed documentation.   Patient agreed to services and verbal consent obtained.   Assessment: Review of patient past medical history, allergies, medications, health status, including review of consultants reports, laboratory and other test data, was performed as part of comprehensive evaluation and provision of chronic care management services.   SDOH (Social Determinants of Health) assessments and interventions performed:  SDOH Interventions    Flowsheet Row Most Recent Value  SDOH Interventions   Housing Interventions Intervention Not Indicated  Transportation Interventions Intervention Not Indicated        CCM Care Plan  No Known Allergies  Outpatient Encounter Medications as of 05/28/2021  Medication Sig   acetaminophen (TYLENOL) 650 MG CR tablet Take 650 mg by mouth every 8 (eight) hours as needed for pain.   atorvastatin (LIPITOR) 80 MG tablet TAKE 1 TABLET(80 MG) BY MOUTH DAILY AT 6 PM   ELIQUIS 2.5 MG TABS tablet TAKE 1 TABLET(2.5 MG) BY MOUTH TWICE DAILY   FEROSUL 325 (65 Fe) MG tablet TAKE 1 TABLET(325 MG) BY MOUTH DAILY   LUMIGAN 0.01 % SOLN Place 1 drop into both eyes at bedtime.   pantoprazole (PROTONIX) 40 MG tablet TAKE 1 TABLET(40 MG) BY MOUTH TWICE DAILY   No facility-administered encounter  medications on file as of 05/28/2021.    Patient Active Problem List   Diagnosis Date Noted   Mixed hyperlipidemia 08/12/2020   Acute blood loss anemia 08/11/2020   Paroxysmal atrial fibrillation ([place]) 06/07/2020   Secondary hypercoagulable state ([place]) 06/07/2020   Ischemic cerebrovascular accident (CVA) of frontal lobe ([place]) 05/20/2019   Pain due to onychomycosis of toenails of both feet 01/20/2019   Prediabetes 07/22/2018   Nail abnormality 07/22/2018   Sinus bradycardia 05/02/2016   Hypotension due to drugs 05/02/2016   History of tachycardia 05/02/2016   GERD (gastroesophageal reflux disease) 02/05/2015   Coronary artery disease involving native coronary artery of native heart without angina pectoris 04/20/2014   Atypical chest pain 04/12/2014   Hypertensive urgency 03/11/2014   Chronic diastolic CHF (congestive heart failure) ([place]) 01/30/2014   Alcohol abuse 01/19/2014   Elevated troponin level not due myocardial infarction 12/19/2013   Gout 12/19/2013   Dyslipidemia 12/16/2013   Hypertensive crisis 12/16/2013   Cerebral infarction ([place]) 07/28/2012   Acute on chronic renal failure ([place]) 07/27/2012   Essential hypertension 07/26/2012   Chronic kidney disease, stage 3b ([place]) 07/26/2012   Poor circulation    Recurrent ventral hernia 02/16/2012   Hernia (acquired) (recurrent) 02/10/2011    Conditions to be addressed/monitored: Paroxysmal atrial fibrillation, Iron deficiency anemia, Essential Hypertension, Prediabetes, Stage 3 Chronic Kidney disease    Problem: Chronic disease education and Care Coordination needs for Paroxysmal atrial fibrillation, Iron deficiency anemia, Essential Hypertension, Prediabetes, Stage 3 Chronic Kidney disease   Priority: High     Long-Range Goal: Assist with  Chronic disease management and Care Coordination needs for Paroxysmal atrial fibrillation, Iron deficiency anemia, Essential Hypertension, Prediabetes, Stage 3 Chronic Kidney disease    Start Date: 05/28/2021  Expected End Date: 05/28/2022  This Visit's Progress: On track  Priority: High  Note:   Current Barriers:  Knowledge Deficits related to plan of care for management of Paroxysmal atrial fibrillation, Iron deficiency anemia, Essential Hypertension, Prediabetes, Stage 3 Chronic Kidney disease   Chronic Disease Management support and education needs related to Paroxysmal atrial fibrillation, Iron deficiency anemia, Essential Hypertension, Prediabetes, Stage 3 Chronic Kidney disease   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   Interventions: 1:1 collaboration with primary care provider regarding development and update of comprehensive plan of care as evidenced by provider attestation and co-signature Inter-disciplinary care team collaboration (see longitudinal plan of care) Evaluation of current treatment plan related to  self management and patient's adherence to plan as established by provider  Falls Interventions: Provided written and verbal education re: potential causes of falls and Fall prevention strategies; Advised patient of importance of notifying provider of falls; Assessed for falls since last encounter; Assessed patients knowledge of fall risk prevention secondary to previously provided education; Assessed for DME needs, patient is using a rollator for mobility at all times   Chronic Kidney Disease Interventions:  (Status:  Goal on track:  Yes Assessed the Designated Party Release sister [name]  understanding of chronic  kidney disease    Evaluation of current treatment plan related to chronic kidney disease self management and patient's adherence to plan as established by provider      Reviewed prescribed diet increase water to 64 oz daily  Reviewed medications with patient and discussed importance of compliance    Advised patient, providing education and rationale, to monitor blood pressure daily and record, calling PCP for findings outside established parameters    Discussed plans with patient for ongoing care management follow up and provided patient with direct contact information for care management team    Last practice recorded BP readings:  BP Readings from Last 3 Encounters:  04/15/21 (!) 142/80  03/05/21 (!) 173/64  03/03/21 (!) 172/77  Most recent eGFR/CrCl:  Lab Results  Component Value Date   EGFR 43 (L) 12/09/2020    No components found for: CRCL  Patient Goals/Self-Care Activities: Patient will self administer medications as prescribed Patient will attend all scheduled provider appointments Patient will call pharmacy for medication refills Patient will call provider office for new concerns or questions  Follow Up Plan:  Telephone follow up appointment with care management team member scheduled for:  08/21/21      Plan:Telephone follow up appointment with care management team member scheduled for:  08/21/21  Barb Merino, RN, BSN, CCM Care Management Coordinator [facility]  Direct Phone: [phone]

## 2021-05-29 NOTE — Progress Notes (Signed)
This encounter was created in error - please disregard.

## 2021-06-05 ENCOUNTER — Other Ambulatory Visit: Payer: Self-pay

## 2021-06-05 ENCOUNTER — Other Ambulatory Visit: Payer: Self-pay | Admitting: Hematology and Oncology

## 2021-06-05 ENCOUNTER — Inpatient Hospital Stay: Payer: Medicare Other | Attending: Hematology and Oncology | Admitting: Hematology and Oncology

## 2021-06-05 ENCOUNTER — Inpatient Hospital Stay: Payer: Medicare Other

## 2021-06-05 VITALS — BP 162/62 | HR 60 | Temp 96.9°F | Resp 16 | Ht 65.0 in | Wt 134.1 lb

## 2021-06-05 DIAGNOSIS — D709 Neutropenia, unspecified: Secondary | ICD-10-CM | POA: Insufficient documentation

## 2021-06-05 DIAGNOSIS — D708 Other neutropenia: Secondary | ICD-10-CM | POA: Diagnosis not present

## 2021-06-05 DIAGNOSIS — D696 Thrombocytopenia, unspecified: Secondary | ICD-10-CM

## 2021-06-05 DIAGNOSIS — K922 Gastrointestinal hemorrhage, unspecified: Secondary | ICD-10-CM | POA: Diagnosis not present

## 2021-06-05 DIAGNOSIS — D500 Iron deficiency anemia secondary to blood loss (chronic): Secondary | ICD-10-CM | POA: Insufficient documentation

## 2021-06-05 LAB — CMP (CANCER CENTER ONLY)
ALT: 30 U/L (ref 0–44)
AST: 24 U/L (ref 15–41)
Albumin: 3.5 g/dL (ref 3.5–5.0)
Alkaline Phosphatase: 145 U/L — ABNORMAL HIGH (ref 38–126)
Anion gap: 7 (ref 5–15)
BUN: 19 mg/dL (ref 8–23)
CO2: 20 mmol/L — ABNORMAL LOW (ref 22–32)
Calcium: 8 mg/dL — ABNORMAL LOW (ref 8.9–10.3)
Chloride: 109 mmol/L (ref 98–111)
Creatinine: 1.77 mg/dL — ABNORMAL HIGH (ref 0.61–1.24)
GFR, Estimated: 37 mL/min — ABNORMAL LOW (ref >60)
Glucose, Bld: 98 mg/dL (ref 70–99)
Potassium: 5.2 mmol/L — ABNORMAL HIGH (ref 3.5–5.1)
Sodium: 136 mmol/L (ref 135–145)
Total Bilirubin: 0.7 mg/dL (ref 0.3–1.2)
Total Protein: 7.4 g/dL (ref 6.5–8.1)

## 2021-06-05 LAB — CBC WITH DIFFERENTIAL (CANCER CENTER ONLY)
Abs Immature Granulocytes: 0 10*3/uL (ref 0.00–0.07)
Basophils Absolute: 0 10*3/uL (ref 0.0–0.1)
Basophils Relative: 1 %
Eosinophils Absolute: 0.1 10*3/uL (ref 0.0–0.5)
Eosinophils Relative: 3 %
HCT: 37.6 % — ABNORMAL LOW (ref 39.0–52.0)
Hemoglobin: 11.8 g/dL — ABNORMAL LOW (ref 13.0–17.0)
Immature Granulocytes: 0 %
Lymphocytes Relative: 24 %
Lymphs Abs: 0.6 10*3/uL — ABNORMAL LOW (ref 0.7–4.0)
MCH: 26.6 pg (ref 26.0–34.0)
MCHC: 31.4 g/dL (ref 30.0–36.0)
MCV: 84.9 fL (ref 80.0–100.0)
Monocytes Absolute: 0.3 10*3/uL (ref 0.1–1.0)
Monocytes Relative: 13 %
Neutro Abs: 1.5 10*3/uL — ABNORMAL LOW (ref 1.7–7.7)
Neutrophils Relative %: 59 %
Platelet Count: 73 10*3/uL — ABNORMAL LOW (ref 150–400)
RBC: 4.43 MIL/uL (ref 4.22–5.81)
RDW: 16 % — ABNORMAL HIGH (ref 11.5–15.5)
WBC Count: 2.6 10*3/uL — ABNORMAL LOW (ref 4.0–10.5)
nRBC: 0 % (ref 0.0–0.2)

## 2021-06-09 ENCOUNTER — Ambulatory Visit (INDEPENDENT_AMBULATORY_CARE_PROVIDER_SITE_OTHER): Payer: Medicare Other

## 2021-06-09 DIAGNOSIS — I480 Paroxysmal atrial fibrillation: Secondary | ICD-10-CM | POA: Diagnosis not present

## 2021-06-09 DIAGNOSIS — N183 Chronic kidney disease, stage 3 unspecified: Secondary | ICD-10-CM

## 2021-06-09 DIAGNOSIS — D509 Iron deficiency anemia, unspecified: Secondary | ICD-10-CM | POA: Diagnosis not present

## 2021-06-09 DIAGNOSIS — I10 Essential (primary) hypertension: Secondary | ICD-10-CM

## 2021-06-09 DIAGNOSIS — I639 Cerebral infarction, unspecified: Secondary | ICD-10-CM

## 2021-06-10 NOTE — Progress Notes (Signed)
Trevor [phone]) [phone]   Fax:(336) [phone]  PROGRESS NOTE  Patient Care Team: Trevor Brine, FNP as PCP - General (General Practice) Trevor Murphy, Trevor Stapler, RN as Hiko History # Iron Deficiency Anemia in the Setting of GI Bleeding #Thrombocytopenia/Leukopenia 08/12/2020: patient underwent an EGD which showed two non-bleeding cratered duodenal ulcers and multiple dispersed erosions with stigmata of recent bleeding. Hgb 7.0, MCV 79 08/23/2020: establish care with Dr. Lorenso Courier  10/18/2020: WBC 3.4, Hgb 11.5, MCV 81.6, Plt 127. Iron sat 49%, ferritin 44 01/20/2021: WBC 2.2, Hgb 11.7, MCV 82, Plt 78 03/03/2021: Bone marrow biopsy showed normocellular marrow with mild erythroid hyperplasia. Minor dysplasia noted.   Interval History:  Trevor Murphy 86 y.o. male with medical history significant for pancytopenia who presents for a follow up visit. The patient's last visit was on 03/05/2021. In the interim since the last visit he has had a bone marrow biopsy performed which did not show any overt abnormalities.  On exam today Trevor Murphy notes he is gained approximately 2 to 3 pounds in the interim since her last visit.  He notes he is done this by eating more milk and bread.  He reports his energy levels are quite good.  He reports that he is taking iron pills without any stomach upset or difficulty.  He reports that due to his vision issues he is unable to tell if there is blood or dark stools.  He notes that he has been doing his best to eat red meat every other day including hamburger but is not able to eat steak due to his lack of dentition.  He currently denies any shortness of breath or infectious symptoms.  He reports no fevers, chills, sweats, nausea, vomiting or diarrhea.  A full 10 point ROS is listed below.  MEDICAL HISTORY:  Past Medical History:  Diagnosis Date   Arthritis    CAD (coronary artery disease)    a.  Cath 08/2009: minimal nonobstructive disease (25% LAD). b. Lexiscan nuc 6/215: EF 54%, no ischemia or infarction.   Chronic diastolic CHF (congestive heart failure) ([place])    a. Echo 2013: EF 55-60%, mild AI, mild MR. b. Echo 5/15 with EF 60-65%, no WMA, mild LVH, mild AI.   CKD (chronic kidney disease) stage 3, GFR 30-59 ml/min (HCC) 07/26/2012   a. Felt likely due to HTN.   CVA (cerebral infarction) 07/28/2012   Glaucoma    Gout    H/O umbilical hernia repair    Headache(784.0)    " WHEN MY BLOOD PRESSURE IS UP"   History of acute renal failure    History of alcohol abuse    HTN (hypertension)    Hypertension    a. Patent renal arteries 2005.   Noncompliance    OA (osteoarthritis)    Other and unspecified hyperlipidemia    Poor circulation    Sinus bradycardia    SVT (supraventricular tachycardia) ([place])    a. Possible AVNRT.  Got adenosine from EMS in 2015 for SVT.  During 5/15 hospitalization had episode of short R-P tachycardia (possible AVNRT) terminated by adenosine.  b. 04/2016 - recurrent tachycardia (SVT vs. AVNRT), again terminated by Adenosine   Visual impairment     SURGICAL HISTORY: Past Surgical History:  Procedure Laterality Date   APPENDECTOMY     BIOPSY  08/12/2020   Procedure: BIOPSY;  Surgeon: Trevor Craver, MD;  Location: [facility] ENDOSCOPY;  Service: Endoscopy;;   BUBBLE STUDY  05/24/2019  Procedure: BUBBLE STUDY;  Surgeon: Trevor Heinz, MD;  Location: [place];  Service: Endoscopy;;   CARDIAC CATHETERIZATION  11/2003   EF 50-55%   CARDIAC CATHETERIZATION  08/2009   CATARACT EXTRACTION EXTRACAPSULAR Right 05/17/2013   Procedure: CATARACT EXTRACTION EXTRACAPSULAR WITH INTRAOCULAR LENS PLACEMENT ([place]) RIGHT EYE;  Surgeon: Trevor Pearson, MD;  Location: [place];  Service: Ophthalmology;  Laterality: Right;   COLONOSCOPY     ESOPHAGOGASTRODUODENOSCOPY (EGD) WITH PROPOFOL N/A 08/12/2020   Procedure: ESOPHAGOGASTRODUODENOSCOPY (EGD) WITH PROPOFOL;  Surgeon: Trevor Craver, MD;  Location: [facility] ENDOSCOPY;  Service: Endoscopy;  Laterality: N/A;   HERNIA REPAIR     LOOP RECORDER INSERTION N/A 05/24/2019   Procedure: LOOP RECORDER INSERTION;  Surgeon: Trevor Haw, MD;  Location: [place] CV LAB;  Service: Cardiovascular;  Laterality: N/A;   TEE WITHOUT CARDIOVERSION N/A 05/24/2019   Procedure: TRANSESOPHAGEAL ECHOCARDIOGRAM (TEE);  Surgeon: Trevor Heinz, MD;  Location: [facility] ENDOSCOPY;  Service: Endoscopy;  Laterality: N/A;   VENTRAL HERNIA REPAIR  03/07/2012   Procedure: LAPAROSCOPIC VENTRAL HERNIA;  Surgeon: Trevor Ober, MD;  Location: [place];  Service: General;  Laterality: N/A;    SOCIAL HISTORY: Social History   Socioeconomic History   Marital status: Married    Spouse name: Not on file   Number of children: Not on file   Years of education: Not on file   Highest education level: Not on file  Occupational History   Occupation: retired  Tobacco Use   Smoking status: Never   Smokeless tobacco: Never  Vaping Use   Vaping Use: Never used  Substance and Sexual Activity   Alcohol use: No    Alcohol/week: 1.0 standard drink    Types: 1 Cans of beer per week    Comment: h/o heavy use, quit in 2017   Drug use: No   Sexual activity: Not Currently  Other Topics Concern   Not on file  Social History Narrative   Not on file   Social Determinants of Health   Financial Resource Strain: Low Risk    Difficulty of Paying Living Expenses: Not hard at all  Food Insecurity: No Food Insecurity   Worried About Charity fundraiser in the Last Year: Never true   Ran Out of Food in the Last Year: Never true  Transportation Needs: No Transportation Needs   Lack of Transportation (Medical): No   Lack of Transportation (Non-Medical): No  Physical Activity: Inactive   Days of Exercise per Week: 0 days   Minutes of Exercise per Session: 0 min  Stress: No Stress Concern Present   Feeling of Stress : Not at all  Social Connections:  Moderately Isolated   Frequency of Communication with Friends and Family: More than three times a week   Frequency of Social Gatherings with Friends and Family: More than three times a week   Attends Religious Services: More than 4 times per year   Active Member of [employer] or Organizations: No   Attends Archivist Meetings: Never   Marital Status: Widowed  Human resources officer Violence: Not on file    FAMILY HISTORY: Family History  Problem Relation Age of Onset   CVA Mother    Hypertension Brother    Hypertension Sister    Hypertension Brother    Stroke Sister    Heart attack Neg Hx     ALLERGIES:  has No Known Allergies.  MEDICATIONS:  Current Outpatient Medications  Medication Sig Dispense Refill   acetaminophen (  TYLENOL) 650 MG CR tablet Take 650 mg by mouth every 8 (eight) hours as needed for pain.     atorvastatin (LIPITOR) 80 MG tablet TAKE 1 TABLET(80 MG) BY MOUTH DAILY AT 6 PM 90 tablet 1   ELIQUIS 2.5 MG TABS tablet TAKE 1 TABLET(2.5 MG) BY MOUTH TWICE DAILY 60 tablet 11   FEROSUL 325 (65 Fe) MG tablet TAKE 1 TABLET(325 MG) BY MOUTH DAILY 90 tablet 1   LUMIGAN 0.01 % SOLN Place 1 drop into both eyes at bedtime.     pantoprazole (PROTONIX) 40 MG tablet TAKE 1 TABLET(40 MG) BY MOUTH TWICE DAILY 60 tablet 2   No current facility-administered medications for this visit.    REVIEW OF SYSTEMS:   Constitutional: ( - ) fevers, ( - )  chills , ( - ) night sweats Eyes: ( - ) blurriness of vision, ( - ) double vision, ( - ) watery eyes Ears, nose, mouth, throat, and face: ( - ) mucositis, ( - ) sore throat Respiratory: ( - ) cough, ( - ) dyspnea, ( - ) wheezes Cardiovascular: ( - ) palpitation, ( - ) chest discomfort, ( - ) lower extremity swelling Gastrointestinal:  ( - ) nausea, ( - ) heartburn, ( - ) change in bowel habits Skin: ( - ) abnormal skin rashes Lymphatics: ( - ) new lymphadenopathy, ( - ) easy bruising Neurological: ( - ) numbness, ( - ) tingling, ( - )  new weaknesses Behavioral/Psych: ( - ) mood change, ( - ) new changes  All other systems were reviewed with the patient and are negative.  PHYSICAL EXAMINATION:  Vitals:   06/05/21 1526  BP: (!) 162/62  Pulse: 60  Resp: 16  Temp: (!) 96.9 F (36.1 C)  SpO2: 100%    Filed Weights   06/05/21 1526  Weight: 134 lb 1.6 oz (60.8 kg)     GENERAL: well appearing elderly African American male alert, no distress and comfortable SKIN: skin color, texture, turgor are normal, no rashes or significant lesions EYES: conjunctiva are pink and non-injected, sclera clear LUNGS: clear to auscultation and percussion with normal breathing effort HEART: regular rate & rhythm and no murmurs and no lower extremity edema Musculoskeletal: no cyanosis of digits and no clubbing  PSYCH: alert & oriented x 3, fluent speech NEURO: no focal motor/sensory deficits  LABORATORY DATA:  I have reviewed the data as listed CBC Latest Ref Rng & Units 06/05/2021 03/05/2021 03/03/2021  WBC 4.0 - 10.5 K/uL 2.6(L) 2.1(L) 2.5(L)  Hemoglobin 13.0 - 17.0 g/dL 11.8(L) 11.0(L) 11.6(L)  Hematocrit 39.0 - 52.0 % 37.6(L) 34.4(L) 37.1(L)  Platelets 150 - 400 K/uL 73(L) 77(L) 73(L)    CMP Latest Ref Rng & Units 06/05/2021 03/05/2021 01/20/2021  Glucose 70 - 99 mg/dL 98 100(H) 97  BUN 8 - 23 mg/dL _0 Creatinine 0.61 - 1.24 mg/dL 1.77(H) 1.69(H) 1.69(H)  Sodium 135 - 145 mmol/L 136 138 137  Potassium 3.5 - 5.1 mmol/L 5.2(H) 3.9 4.3  Chloride 98 - 111 mmol/L 109 105 107  CO2 22 - 32 mmol/L 20(L) 26 25  Calcium 8.9 - 10.3 mg/dL 8.0(L) 7.8(L) 7.9(L)  Total Protein 6.5 - 8.1 g/dL 7.4 6.7 7.0  Total Bilirubin 0.3 - 1.2 mg/dL 0.7 1.1 0.8  Alkaline Phos 38 - 126 U/L 145(H) 92 106  AST 15 - 41 U/L _1 ALT 0 - 44 U/L _2 Lab Results  Component Value  Date   MPROTEIN Not Observed 08/23/2020   Lab Results  Component Value Date   KPAFRELGTCHN 61.1 (H) 08/23/2020   LAMBDASER 34.8 (H) 08/23/2020    KAPLAMBRATIO 1.76 (H) 08/23/2020     RADIOGRAPHIC STUDIES: CUP PACEART REMOTE DEVICE CHECK  Result Date: 05/13/2021 ILR summary report received. Battery status OK. Normal device function. No new symptom, tachy, or pause episodes. No new AF episodes. Monthly summary reports and ROV/PRN 11 brady events, longest duration 34mn 45sec, HR's 32-38, some nocturnal. Not a new finding, no symptom activations LR    ASSESSMENT & PLAN FPete Glatter89y.o. male with medical history significant for iron deficiency anemia 2/2 to GI bleeding who presents for a follow up visit.  After review the labs, the records, schedule the patient the findings most consistent with iron deficiency anemia due to GI bleeding which is properly responding to p.o. iron therapy.  His hemoglobin had increased to 11.7 and he has no overt signs or symptoms of anemia at this time.  I would recommend we continue with his p.o. iron therapy and have him return in 3 months time in order to assure that his stores are adequately repleted.  Unfortunately on exam today he was found to have a drop in his white blood cell count down to 2.1 and his platelet count down to 77.  The reason for this is not clear.  He did previously have borderline low levels of vitamin B12 but fortunately had a negative multiple myeloma panel.  Possible etiologies include nutritional deficiency, liver dysfunction, or bone marrow dysfunction.  Bone marrow biopsy performed on 03/04/2019 did not show any clear signs of abnormality.  At this time we will continue to monitor his cytopenias.  # Iron Deficiency Anemia in the Setting of GI Bleeding -- findings most consistent with iron deficiency anemia in the setting of GI bleed.   --Hgb stable at 11.8, recommend continue PO ferrous sulfate 3249mPO daily.  -- no alternative sources of anemia noted on our workup.  -- May be a component of CKD contributing to the patient's anemia.  Can consider erythropoietin treatment if Hgb  drops <10 -- Additional nutritional labs with folic acid and vitamin B12 were WNL. -- Plan for return to clinic in 6 months time for continued monitoring.  #Thrombocytopenia #Leukopenia, Neutropenia -- Etiology is unclear.  The patient is always had borderline low levels of platelets and white blood cell counts, however there has been a steady drop on his counts today --WBC 2.6, ANC 1.5, Plt 73 today.  -- full nutritional panel including vitamin B12, folate, methylmalonic acid, homocystine as well as copper has been negative  --negative HIV, hepatitis B and C panels --peripheral blood film shows no concerning findings.  --Ultrasound showed no clear evidence of liver disease or splenomegaly on 02/12/21 --because no clear etiology could found we pursued a bone marrow biopsy.  This has showed some mild dysplasia but no overt abnormalities. --Continue to monitor  No orders of the defined types were placed in this encounter.  All questions were answered. The patient knows to call the clinic with any problems, questions or concerns.  A total of more than 30 minutes were spent on this encounter and over half of that time was spent on counseling and coordination of care as outlined above.   Trevor PeoplesMD Department of Hematology/Oncology CoNavarrot WeAllegheny Clinic Dba Ahn Westmoreland Endoscopy Centerhone: [phone]: [phone]: [email or web address]  06/10/2021 8:13 AM

## 2021-06-16 LAB — CUP PACEART REMOTE DEVICE CHECK
Date Time Interrogation Session: 20221105231110
Implantable Pulse Generator Implant Date: 20201014

## 2021-06-16 NOTE — Progress Notes (Signed)
Carelink Summary Report / Loop Recorder 

## 2021-06-18 ENCOUNTER — Telehealth: Payer: Self-pay | Admitting: Hematology and Oncology

## 2021-06-18 DIAGNOSIS — H4323 Crystalline deposits in vitreous body, bilateral: Secondary | ICD-10-CM | POA: Diagnosis not present

## 2021-06-18 DIAGNOSIS — H35033 Hypertensive retinopathy, bilateral: Secondary | ICD-10-CM | POA: Diagnosis not present

## 2021-06-18 DIAGNOSIS — H401133 Primary open-angle glaucoma, bilateral, severe stage: Secondary | ICD-10-CM | POA: Diagnosis not present

## 2021-06-18 DIAGNOSIS — H5442A3 Blindness left eye category 3, normal vision right eye: Secondary | ICD-10-CM | POA: Diagnosis not present

## 2021-06-18 NOTE — Telephone Encounter (Signed)
Sch per 11/1 inbskt , pt sister aware, mailed calendar

## 2021-07-10 ENCOUNTER — Ambulatory Visit (INDEPENDENT_AMBULATORY_CARE_PROVIDER_SITE_OTHER): Payer: Medicare Other

## 2021-07-10 DIAGNOSIS — I639 Cerebral infarction, unspecified: Secondary | ICD-10-CM

## 2021-07-10 LAB — CUP PACEART REMOTE DEVICE CHECK
Date Time Interrogation Session: 20221201123022
Implantable Pulse Generator Implant Date: 20201014

## 2021-07-21 ENCOUNTER — Other Ambulatory Visit: Payer: Self-pay

## 2021-07-21 ENCOUNTER — Ambulatory Visit (HOSPITAL_COMMUNITY)
Admission: RE | Admit: 2021-07-21 | Discharge: 2021-07-21 | Disposition: A | Discharge status: Home / Self Care | Payer: Medicare Other | Source: Ambulatory Visit | Attending: Physician Assistant | Admitting: Physician Assistant

## 2021-07-21 ENCOUNTER — Encounter (HOSPITAL_COMMUNITY): Payer: Self-pay | Admitting: Physician Assistant

## 2021-07-21 VITALS — BP 142/92 | HR 63 | Ht 65.0 in | Wt 139.2 lb

## 2021-07-21 DIAGNOSIS — N183 Chronic kidney disease, stage 3 unspecified: Secondary | ICD-10-CM | POA: Diagnosis not present

## 2021-07-21 DIAGNOSIS — I2510 Atherosclerotic heart disease of native coronary artery without angina pectoris: Secondary | ICD-10-CM | POA: Diagnosis not present

## 2021-07-21 DIAGNOSIS — Z8673 Personal history of transient ischemic attack (TIA), and cerebral infarction without residual deficits: Secondary | ICD-10-CM | POA: Insufficient documentation

## 2021-07-21 DIAGNOSIS — Z7901 Long term (current) use of anticoagulants: Secondary | ICD-10-CM | POA: Insufficient documentation

## 2021-07-21 DIAGNOSIS — I130 Hypertensive heart and chronic kidney disease with heart failure and stage 1 through stage 4 chronic kidney disease, or unspecified chronic kidney disease: Secondary | ICD-10-CM | POA: Insufficient documentation

## 2021-07-21 DIAGNOSIS — I5032 Chronic diastolic (congestive) heart failure: Secondary | ICD-10-CM | POA: Insufficient documentation

## 2021-07-21 DIAGNOSIS — I480 Paroxysmal atrial fibrillation: Secondary | ICD-10-CM | POA: Diagnosis not present

## 2021-07-21 DIAGNOSIS — D6869 Other thrombophilia: Secondary | ICD-10-CM

## 2021-07-21 DIAGNOSIS — D631 Anemia in chronic kidney disease: Secondary | ICD-10-CM | POA: Insufficient documentation

## 2021-07-21 NOTE — Progress Notes (Signed)
Primary Care Physician: [name], [place] Primary Cardiologist: Dr Aundra Dubin (remotely) Primary Electrophysiologist: Dr Curt Bears  Referring Physician: Dr Elin[facility]   Trevor Murphy is a 86 y.o. male with a history of SVT, non obstructive CAD, chronic diastolic CHF, anemia, CKD stage III, prior CVA, HTN, and paroxysmal atrial fibrillation who presents for follow up in the [facility]. The patient was initially diagnosed with atrial fibrillation 06/06/20 on ILR following a cryptogenic stroke in 05/2019. Patient was asymptomatic and rate controlled during the episodes. Some appear to be true afib and others SR with PACs. All < 10 minutes. Patient has a CHADS2VASC score of 6. He has been to the ED twice with palpitations which lasted about 1 hour after a BM. At both visits he was in [place] on presentation. ILR showed no arrhythmia events. He was also admitted 08/11/20 with acute anemia and melenic stools. Workup per GI revealed duodenal ulcers, cleared to resume anticoagulation.   On follow up today, patient reports that has has done well since his last visit. ILR shows 0% afib burden. He denies any bleeding issues on anticoagulation. No heart racing or palpitations.   Today, he denies symptoms of palpitations, chest pain, shortness of breath, orthopnea, PND, lower extremity edema, dizziness, presyncope, syncope, snoring, daytime somnolence, bleeding, or neurologic sequela. The patient is tolerating medications without difficulties and is otherwise without complaint today.    Atrial Fibrillation Risk Factors:  he does not have symptoms or diagnosis of sleep apnea. he does not have a history of rheumatic fever. he does not have a history of alcohol use.   he has a BMI of Body mass index is 23.16 kg/m.[name] Kitchen Filed Weights   07/21/21 1035  Weight: 63.1 kg      Family History  Problem Relation Age of Onset   CVA Mother    Hypertension Brother    Hypertension  Sister    Hypertension Brother    Stroke Sister    Heart attack Neg Hx      Atrial Fibrillation Management history:  Previous antiarrhythmic drugs: none Previous cardioversions: none Previous ablations: none CHADS2VASC score: 6 Anticoagulation history: Eliquis   Past Medical History:  Diagnosis Date   Arthritis    CAD (coronary artery disease)    a. Cath 08/2009: minimal nonobstructive disease (25% LAD). b. Lexiscan nuc 6/215: EF 54%, no ischemia or infarction.   Chronic diastolic CHF (congestive heart failure) ([place])    a. Echo 2013: EF 55-60%, mild AI, mild MR. b. Echo 5/15 with EF 60-65%, no WMA, mild LVH, mild AI.   CKD (chronic kidney disease) stage 3, GFR 30-59 ml/min (HCC) 07/26/2012   a. Felt likely due to HTN.   CVA (cerebral infarction) 07/28/2012   Glaucoma    Gout    H/O umbilical hernia repair    Headache(784.0)    " WHEN MY BLOOD PRESSURE IS UP"   History of acute renal failure    History of alcohol abuse    HTN (hypertension)    Hypertension    a. Patent renal arteries 2005.   Noncompliance    OA (osteoarthritis)    Other and unspecified hyperlipidemia    Poor circulation    Sinus bradycardia    SVT (supraventricular tachycardia) ([place])    a. Possible AVNRT.  Got adenosine from EMS in 2015 for SVT.  During 5/15 hospitalization had episode of short R-P tachycardia (possible AVNRT) terminated by adenosine.  b. 04/2016 - recurrent tachycardia (SVT vs.  AVNRT), again terminated by Adenosine   Visual impairment    Past Surgical History:  Procedure Laterality Date   APPENDECTOMY     BIOPSY  08/12/2020   Procedure: BIOPSY;  Surgeon: Juanita Craver, MD;  Location: [facility] ENDOSCOPY;  Service: Endoscopy;;   BUBBLE STUDY  05/24/2019   Procedure: BUBBLE STUDY;  Surgeon: Donato Heinz, MD;  Location: [place];  Service: Endoscopy;;   CARDIAC CATHETERIZATION  11/2003   EF 50-55%   CARDIAC CATHETERIZATION  08/2009   CATARACT EXTRACTION EXTRACAPSULAR Right  05/17/2013   Procedure: CATARACT EXTRACTION EXTRACAPSULAR WITH INTRAOCULAR LENS PLACEMENT ([place]) RIGHT EYE;  Surgeon: Marylynn Pearson, MD;  Location: [place];  Service: Ophthalmology;  Laterality: Right;   COLONOSCOPY     ESOPHAGOGASTRODUODENOSCOPY (EGD) WITH PROPOFOL N/A 08/12/2020   Procedure: ESOPHAGOGASTRODUODENOSCOPY (EGD) WITH PROPOFOL;  Surgeon: Juanita Craver, MD;  Location: [facility] ENDOSCOPY;  Service: Endoscopy;  Laterality: N/A;   HERNIA REPAIR     LOOP RECORDER INSERTION N/A 05/24/2019   Procedure: LOOP RECORDER INSERTION;  Surgeon: Constance Haw, MD;  Location: [place] CV LAB;  Service: Cardiovascular;  Laterality: N/A;   TEE WITHOUT CARDIOVERSION N/A 05/24/2019   Procedure: TRANSESOPHAGEAL ECHOCARDIOGRAM (TEE);  Surgeon: Donato Heinz, MD;  Location: [facility] ENDOSCOPY;  Service: Endoscopy;  Laterality: N/A;   VENTRAL HERNIA REPAIR  03/07/2012   Procedure: LAPAROSCOPIC VENTRAL HERNIA;  Surgeon: Gwenyth Ober, MD;  Location: [place];  Service: General;  Laterality: N/A;    Current Outpatient Medications  Medication Sig Dispense Refill   acetaminophen (TYLENOL) 650 MG CR tablet Take 650 mg by mouth every 8 (eight) hours as needed for pain.     atorvastatin (LIPITOR) 80 MG tablet TAKE 1 TABLET(80 MG) BY MOUTH DAILY AT 6 PM 90 tablet 1   ELIQUIS 2.5 MG TABS tablet TAKE 1 TABLET(2.5 MG) BY MOUTH TWICE DAILY 60 tablet 11   FEROSUL 325 (65 Fe) MG tablet TAKE 1 TABLET(325 MG) BY MOUTH DAILY 90 tablet 1   LUMIGAN 0.01 % SOLN Place 1 drop into both eyes at bedtime.     SIMBRINZA 1-0.2 % SUSP Apply to eye.     No current facility-administered medications for this encounter.    No Known Allergies  Social History   Socioeconomic History   Marital status: Married    Spouse name: Not on file   Number of children: Not on file   Years of education: Not on file   Highest education level: Not on file  Occupational History   Occupation: retired  Tobacco Use   Smoking status: Never    Smokeless tobacco: Never  Vaping Use   Vaping Use: Never used  Substance and Sexual Activity   Alcohol use: No    Alcohol/week: 1.0 standard drink    Types: 1 Cans of beer per week    Comment: h/o heavy use, quit in 2017   Drug use: No   Sexual activity: Not Currently  Other Topics Concern   Not on file  Social History Narrative   Not on file   Social Determinants of Health   Financial Resource Strain: Low Risk    Difficulty of Paying Living Expenses: Not hard at all  Food Insecurity: No Food Insecurity   Worried About Charity fundraiser in the Last Year: Never true   [place] in the Last Year: Never true  Transportation Needs: No Transportation Needs   Lack of Transportation (Medical): No   Lack of Transportation (Non-Medical): No  Physical Activity: Inactive   Days of Exercise per Week: 0 days   Minutes of Exercise per Session: 0 min  Stress: No Stress Concern Present   Feeling of Stress : Not at all  Social Connections: Moderately Isolated   Frequency of Communication with Friends and Family: More than three times a week   Frequency of Social Gatherings with Friends and Family: More than three times a week   Attends Religious Services: More than 4 times per year   Active Member of [employer] or Organizations: No   Attends Archivist Meetings: Never   Marital Status: Widowed  Intimate Partner Violence: Not on file     ROS- All systems are reviewed and negative except as per the HPI above.  Physical Exam: Vitals:   07/21/21 1035  BP: (!) 142/92  Pulse: 63  Weight: 63.1 kg  Height: 5\' 5"  (1.651 m)    GEN- The patient is a well appearing elderly male, alert and oriented x 3 today.   HEENT-head normocephalic, atraumatic, sclera clear, conjunctiva pink, hearing intact, trachea midline. Lungs- Clear to ausculation bilaterally, normal work of breathing Heart- Regular rate and rhythm, no murmurs, rubs or gallops  GI- soft, NT, ND, + BS Extremities- no  clubbing, cyanosis, or edema MS- no significant deformity or atrophy Skin- no rash or lesion Psych- euthymic mood, full affect Neuro- strength and sensation are intact   Wt Readings from Last 3 Encounters:  07/21/21 63.1 kg  06/05/21 60.8 kg  04/15/21 59.1 kg    EKG today demonstrates  SR, 1st degree AV block, RBBB, PVC Vent. rate 63 BPM PR interval 270 ms QRS duration 126 ms QT/QTcB 492/503 ms  Echo 05/20/19 demonstrated  1. Left ventricular ejection fraction, by visual estimation, is 55 to  60%. The left ventricle has normal function. Normal left ventricular size. There is severely increased left ventricular hypertrophy.   2. Left ventricular diastolic Doppler parameters are consistent with  impaired relaxation pattern of LV diastolic filling.   3. Global right ventricle has normal systolic function.The right  ventricular size is normal. No increase in right ventricular wall  thickness.   4. Left atrial size was normal.   5. Right atrial size was normal.   6. The mitral valve is normal in structure. Mild mitral valve  regurgitation. No evidence of mitral stenosis.   7. The tricuspid valve is normal in structure. Tricuspid valve  regurgitation was not visualized by color flow Doppler.   8. The aortic valve is normal in structure. Aortic valve regurgitation is  mild by color flow Doppler. Mild to moderate aortic valve  sclerosis/calcification without any evidence of aortic stenosis.   9. The pulmonic valve was normal in structure. Pulmonic valve  regurgitation is not visualized by color flow Doppler.  10. The inferior vena cava is normal in size with greater than 50%  respiratory variability, suggesting right atrial pressure of 3 mmHg.   Epic records are reviewed at length today  CHA2DS2-VASc Score = 6  The patient's score is based upon: CHF History: 0 HTN History: 1 Diabetes History: 0 Stroke History: 2 Vascular Disease History: 1 Age Score: 2 Gender Score: 0        ASSESSMENT AND PLAN: 1. Paroxysmal Atrial Fibrillation (ICD10:  I48.0) The patient's CHA2DS2-VASc score is 6, indicating a 9.7% annual risk of stroke.   ILR continues to show 0% afib burden. Anemia and thrombocytopenia followed by Dr Lorenso Courier.  Continue Eliquis 2.5 mg (age, Cr >1.5) for  now.  2. Secondary Hypercoagulable State (ICD10:  D68.69) The patient is at significant risk for stroke/thromboembolism based upon his CHA2DS2-VASc Score of 6.  Continue Apixaban (Eliquis).   3. HTN Stable, no changes today.  4. CAD Non obstructive on LHC 2011 and normal stress test 2015. No anginal symptoms.   Follow up in the AF clinic in one year.    [facility] [address] [place],  [address] [phone] 07/21/2021 10:51 AM

## 2021-07-21 NOTE — Progress Notes (Signed)
Carelink Summary Report / [email or web address] 

## 2021-07-22 ENCOUNTER — Other Ambulatory Visit (HOSPITAL_COMMUNITY): Payer: Self-pay

## 2021-07-22 MED ORDER — APIXABAN 2.5 MG PO TABS: ORAL_TABLET | ORAL | 0 refills | Status: DC

## 2021-08-06 ENCOUNTER — Encounter: Payer: Self-pay | Admitting: Podiatry

## 2021-08-06 ENCOUNTER — Ambulatory Visit (INDEPENDENT_AMBULATORY_CARE_PROVIDER_SITE_OTHER): Payer: Medicare Other | Admitting: Podiatry

## 2021-08-06 ENCOUNTER — Other Ambulatory Visit: Payer: Self-pay

## 2021-08-06 DIAGNOSIS — B351 Tinea unguium: Secondary | ICD-10-CM

## 2021-08-06 DIAGNOSIS — M79674 Pain in right toe(s): Secondary | ICD-10-CM

## 2021-08-06 DIAGNOSIS — M79675 Pain in left toe(s): Secondary | ICD-10-CM

## 2021-08-06 DIAGNOSIS — I739 Peripheral vascular disease, unspecified: Secondary | ICD-10-CM

## 2021-08-06 DIAGNOSIS — N183 Chronic kidney disease, stage 3 unspecified: Secondary | ICD-10-CM

## 2021-08-06 NOTE — Progress Notes (Signed)
This patient returns to my office for at risk foot care.  This patient requires this care by a professional since this patient will be at risk due to having chronic kidney disease and coagulation defect.  Patient is taking eliquis.   This patient is unable to cut nails himself since the patient cannot reach his nails.These nails are painful walking and wearing shoes.  This patient presents for at risk foot care today.  General Appearance  Alert, conversant and in no acute stress.  Vascular  Dorsalis pedis and posterior tibial  pulses are weakly  palpable  bilaterally.  Capillary return is within normal limits  Bilaterally. Cold feet bilaterally. Absent digital hair  B/L.  Neurologic  Senn-Weinstein monofilament wire test within normal limits  bilaterally. Muscle power within normal limits bilaterally.  Nails Thick disfigured discolored nails with subungual debris  from hallux to fifth toes bilaterally. No evidence of bacterial infection or drainage bilaterally.  Orthopedic  No limitations of motion  feet .  No crepitus or effusions noted.  No bony pathology or digital deformities noted.  HAV  B/L.  Skin  normotropic skin with no porokeratosis noted bilaterally.  No signs of infections or ulcers noted.     Onychomycosis  Pain in right toes  Pain in left toes  Consent was obtained for treatment procedures.   Mechanical debridement of nails 1-5  bilaterally performed with a nail nipper.  Filed with dremel without incident.  Cauterized fourth toe left foot.   Return office visit     10  weeks                Told patient to return for periodic foot care and evaluation due to potential at risk complications.   Kaevon Cotta DPM  

## 2021-08-08 ENCOUNTER — Other Ambulatory Visit (HOSPITAL_COMMUNITY): Payer: Self-pay

## 2021-08-08 MED ORDER — APIXABAN 2.5 MG PO TABS: ORAL_TABLET | ORAL | 0 refills | Status: DC

## 2021-08-12 ENCOUNTER — Telehealth: Payer: Self-pay | Admitting: Nurse Practitioner

## 2021-08-12 ENCOUNTER — Ambulatory Visit (INDEPENDENT_AMBULATORY_CARE_PROVIDER_SITE_OTHER): Payer: Medicare Other

## 2021-08-12 DIAGNOSIS — I480 Paroxysmal atrial fibrillation: Secondary | ICD-10-CM | POA: Diagnosis not present

## 2021-08-12 NOTE — Telephone Encounter (Signed)
Left message for patient to call back and schedule Medicare Annual Wellness Visit (AWV) either virtually or in office.  Left both my jabber number [phone] and office number    Last AWV 09/12/20  calendar year [facility]   please schedule at anytime with [facility]    This should be a 45 minute visit.

## 2021-08-13 LAB — CUP PACEART REMOTE DEVICE CHECK
Date Time Interrogation Session: 20230102230630
Implantable Pulse Generator Implant Date: 20201014

## 2021-08-19 ENCOUNTER — Other Ambulatory Visit: Payer: Self-pay

## 2021-08-19 ENCOUNTER — Ambulatory Visit (INDEPENDENT_AMBULATORY_CARE_PROVIDER_SITE_OTHER): Payer: Commercial Managed Care - HMO | Admitting: Nurse Practitioner

## 2021-08-19 ENCOUNTER — Encounter: Payer: Self-pay | Admitting: Nurse Practitioner

## 2021-08-19 VITALS — BP 122/68 | HR 71 | Temp 97.8°F | Ht 69.0 in | Wt 138.8 lb

## 2021-08-19 DIAGNOSIS — N183 Chronic kidney disease, stage 3 unspecified: Secondary | ICD-10-CM | POA: Diagnosis not present

## 2021-08-19 DIAGNOSIS — I129 Hypertensive chronic kidney disease with stage 1 through stage 4 chronic kidney disease, or unspecified chronic kidney disease: Secondary | ICD-10-CM | POA: Diagnosis not present

## 2021-08-19 DIAGNOSIS — I5032 Chronic diastolic (congestive) heart failure: Secondary | ICD-10-CM

## 2021-08-19 DIAGNOSIS — I739 Peripheral vascular disease, unspecified: Secondary | ICD-10-CM

## 2021-08-19 DIAGNOSIS — I471 Supraventricular tachycardia: Secondary | ICD-10-CM

## 2021-08-19 DIAGNOSIS — I110 Hypertensive heart disease with heart failure: Secondary | ICD-10-CM | POA: Diagnosis not present

## 2021-08-19 DIAGNOSIS — E782 Mixed hyperlipidemia: Secondary | ICD-10-CM | POA: Diagnosis not present

## 2021-08-19 NOTE — Patient Instructions (Signed)

## 2021-08-19 NOTE — Progress Notes (Signed)
I,Tianna Badgett,acting as a Education administrator for [employer], FNP.,have documented all relevant documentation on the behalf of [name], FNP,as directed by  [name], FNP while in the presence of [name], [place].  This visit occurred during the SARS-CoV-2 public health emergency.  Safety protocols were in place, including screening questions prior to the visit, additional usage of staff PPE, and extensive cleaning of exam room while observing appropriate contact time as indicated for disinfecting solutions.  Subjective:     Patient ID: Trevor Murphy , male    DOB: 1934/08/22 , 87 y.o.   MRN: [id number]   Chief Complaint  Patient presents with   Hypertension    HPI  Patient presents today for HTN follow up. Verbalizes eating "junk food" brought to him by family and would like a good meal from time to time since he is unable to cook.  Hypertension This is a chronic problem. The current episode started more than 1 year ago. The problem is controlled. Pertinent negatives include no anxiety, chest pain, palpitations or peripheral edema. There are no associated agents to hypertension. Risk factors for coronary artery disease include male gender. There are no compliance problems.     Past Medical History:  Diagnosis Date   Arthritis    CAD (coronary artery disease)    a. Cath 08/2009: minimal nonobstructive disease (25% LAD). b. Lexiscan nuc 6/215: EF 54%, no ischemia or infarction.   Chronic diastolic CHF (congestive heart failure) ([place])    a. Echo 2013: EF 55-60%, mild AI, mild MR. b. Echo 5/15 with EF 60-65%, no WMA, mild LVH, mild AI.   CKD (chronic kidney disease) stage 3, GFR 30-59 ml/min (HCC) 07/26/2012   a. Felt likely due to HTN.   CVA (cerebral infarction) 07/28/2012   Glaucoma    Gout    H/O umbilical hernia repair    Headache(784.0)    " WHEN MY BLOOD PRESSURE IS UP"   History of acute renal failure    History of alcohol abuse    HTN (hypertension)    Hypertension    a.  Patent renal arteries 2005.   Noncompliance    OA (osteoarthritis)    Other and unspecified hyperlipidemia    Poor circulation    Sinus bradycardia    SVT (supraventricular tachycardia) ([place])    a. Possible AVNRT.  Got adenosine from EMS in 2015 for SVT.  During 5/15 hospitalization had episode of short R-P tachycardia (possible AVNRT) terminated by adenosine.  b. 04/2016 - recurrent tachycardia (SVT vs. AVNRT), again terminated by Adenosine   Visual impairment      Family History  Problem Relation Age of Onset   CVA Mother    Hypertension Brother    Hypertension Sister    Hypertension Brother    Stroke Sister    Heart attack Neg Hx      Current Outpatient Medications:    acetaminophen (TYLENOL) 650 MG CR tablet, Take 650 mg by mouth every 8 (eight) hours as needed for pain., Disp: , Rfl:    apixaban (ELIQUIS) 2.5 MG TABS tablet, TAKE 1 TABLET(2.5 MG) BY MOUTH TWICE DAILY, Disp: 56 tablet, Rfl: 0   atorvastatin (LIPITOR) 80 MG tablet, TAKE 1 TABLET(80 MG) BY MOUTH DAILY AT 6 PM, Disp: 90 tablet, Rfl: 1   FEROSUL 325 (65 Fe) MG tablet, TAKE 1 TABLET(325 MG) BY MOUTH DAILY, Disp: 90 tablet, Rfl: 1   LUMIGAN 0.01 % SOLN, Place 1 drop into both eyes at bedtime., Disp: ,  Rfl:    SIMBRINZA 1-0.2 % SUSP, Apply to eye., Disp: , Rfl:    No Known Allergies   Review of Systems  Constitutional: Negative.  Negative for fatigue.  Eyes:        Legally blind  Respiratory: Negative.    Cardiovascular: Negative.  Negative for chest pain, palpitations and leg swelling.  Gastrointestinal: Negative.   Neurological: Negative.   Psychiatric/Behavioral: Negative.      Today's Vitals   08/19/21 1019  BP: 122/68  Pulse: 71  Temp: 97.8 F (36.6 C)  TempSrc: Oral  Weight: 138 lb 12.8 oz (63 kg)  Height: $Remove'5\' 9"'mGIgLYe$  (1.753 m)   Body mass index is 20.5 kg/m.  Wt Readings from Last 3 Encounters:  08/19/21 138 lb 12.8 oz (63 kg)  07/21/21 139 lb 3.2 oz (63.1 kg)  06/05/21 134 lb 1.6 oz (60.8 kg)     Objective:  Physical Exam Vitals reviewed.  Constitutional:      General: He is not in acute distress.    Appearance: Normal appearance.  Eyes:     Extraocular Movements: Extraocular movements intact.     Conjunctiva/sclera: Conjunctivae normal.     Pupils: Pupils are equal, round, and reactive to light.  Cardiovascular:     Rate and Rhythm: Normal rate and regular rhythm.     Pulses: Normal pulses.     Heart sounds: Normal heart sounds. No murmur heard. Pulmonary:     Effort: Pulmonary effort is normal. No respiratory distress.     Breath sounds: Normal breath sounds. No wheezing.  Musculoskeletal:     Comments: Using a cane  Skin:    General: Skin is warm and dry.     Capillary Refill: Capillary refill takes less than 2 seconds.  Neurological:     General: No focal deficit present.     Mental Status: He is alert and oriented to person, place, and time.     Cranial Nerves: No cranial nerve deficit.     Motor: No weakness.  Psychiatric:        Mood and Affect: Mood normal.        Behavior: Behavior normal.        Thought Content: Thought content normal.        Judgment: Judgment normal.        Assessment And Plan:     1. Benign hypertension with chronic kidney disease, stage III (HCC) Comments: Blood pressure is well controlled, continue current medications - CMP14+EGFR  2. Stage 3 chronic kidney disease, unspecified whether stage 3a or 3b CKD ([place]) Comments: Continue to stay well hydrated with water  3. Paroxysmal supraventricular tachycardia ([place]) Comments: Controlled, managed by cardiology. HR is within normal range   4. Chronic diastolic heart failure (HCC) Comments: Stable, continue follow up with Cardiology. No recent weight gain  5. Mixed hyperlipidemia Comments: Well controlled. Continue current medications - Lipid panel - CMP14+EGFR  6. PVD (peripheral vascular disease) ([place]) Comments: He is taking Eliquis for his heart.     Patient was given  opportunity to ask questions. Patient verbalized understanding of the plan and was able to repeat key elements of the plan. All questions were answered to their satisfaction.  [name], FNP   I, [name], FNP, have reviewed all documentation for this visit. The documentation on 08/19/21 for the exam, diagnosis, procedures, and orders are all accurate and complete.   IF YOU HAVE BEEN REFERRED TO A SPECIALIST, IT MAY TAKE 1-2 WEEKS TO SCHEDULE/PROCESS  THE REFERRAL. IF YOU HAVE NOT HEARD FROM US/SPECIALIST IN TWO WEEKS, PLEASE GIVE [place] A CALL AT [phone].   THE PATIENT IS ENCOURAGED TO PRACTICE SOCIAL DISTANCING DUE TO THE COVID-19 PANDEMIC.

## 2021-08-20 LAB — CMP14+EGFR
ALT: 36 IU/L (ref 0–44)
AST: 37 IU/L (ref 0–40)
Albumin/Globulin Ratio: 1.1 — ABNORMAL LOW (ref 1.2–2.2)
Albumin: 3.7 g/dL (ref 3.6–4.6)
Alkaline Phosphatase: 160 IU/L — ABNORMAL HIGH (ref 44–121)
BUN/Creatinine Ratio: 10 (ref 10–24)
BUN: 14 mg/dL (ref 8–27)
Bilirubin Total: 0.9 mg/dL (ref 0.0–1.2)
CO2: 20 mmol/L (ref 20–29)
Calcium: 7.6 mg/dL — ABNORMAL LOW (ref 8.6–10.2)
Chloride: 109 mmol/L — ABNORMAL HIGH (ref 96–106)
Creatinine, Ser: 1.44 mg/dL — ABNORMAL HIGH (ref 0.76–1.27)
Globulin, Total: 3.3 g/dL (ref 1.5–4.5)
Glucose: 107 mg/dL — ABNORMAL HIGH (ref 70–99)
Potassium: 4.6 mmol/L (ref 3.5–5.2)
Sodium: 142 mmol/L (ref 134–144)
Total Protein: 7 g/dL (ref 6.0–8.5)
eGFR: 48 mL/min/{1.73_m2} — ABNORMAL LOW (ref >59)

## 2021-08-20 LAB — LIPID PANEL
Chol/HDL Ratio: 1.9 ratio (ref 0.0–5.0)
Cholesterol, Total: 69 mg/dL — ABNORMAL LOW (ref 100–199)
HDL: 37 mg/dL — ABNORMAL LOW (ref >39)
LDL Chol Calc (NIH): 18 mg/dL (ref 0–99)
Triglycerides: 56 mg/dL (ref 0–149)
VLDL Cholesterol Cal: 14 mg/dL (ref 5–40)

## 2021-08-21 ENCOUNTER — Telehealth: Payer: Medicare Other

## 2021-08-22 ENCOUNTER — Telehealth: Payer: Self-pay

## 2021-08-22 ENCOUNTER — Telehealth: Payer: Commercial Managed Care - HMO

## 2021-08-22 NOTE — Progress Notes (Signed)
Carelink Summary Report / Loop Recorder 

## 2021-08-22 NOTE — Telephone Encounter (Signed)
°  Care Management   Follow Up Note   08/22/2021 Name: Trevor Murphy MRN: [id number] DOB: 03-Jan-1935   Referred by: [name], FNP Reason for referral : Chronic Care Management (Unsuccessful call)   An unsuccessful telephone outreach was attempted today. The patient was referred to the case management team for assistance with care management and care coordination. HIPAA compliant voice message left requesting a return call.  Follow Up Plan: The care management team will reach out to the patient again over the next 14 days.   [name], BSW, CDP Social Worker, Certified Dementia Practitioner [place] [facility] [place] [facility] [phone]

## 2021-09-01 ENCOUNTER — Telehealth: Payer: Self-pay

## 2021-09-01 ENCOUNTER — Telehealth: Payer: Commercial Managed Care - HMO

## 2021-09-01 NOTE — Telephone Encounter (Signed)
°  Care Management   Follow Up Note   09/01/2021 Name: Trevor Murphy MRN: [id number] DOB: 1934/09/01   Referred by: [name], FNP Reason for referral : Chronic Care Management (Unsuccessful call)   A second unsuccessful telephone outreach was attempted today. The patient was referred to the case management team for assistance with care management and care coordination.   Follow Up Plan: The care management team will reach out to the patient again over the next 21 days.   [name], BSW, CDP Social Worker, Certified Dementia Practitioner [place] [facility] [place] [facility] [phone]

## 2021-09-09 ENCOUNTER — Other Ambulatory Visit: Payer: Self-pay

## 2021-09-09 MED ORDER — ATORVASTATIN CALCIUM 80 MG PO TABS: ORAL_TABLET | ORAL | 1 refills | Status: DC

## 2021-09-11 ENCOUNTER — Telehealth: Payer: Commercial Managed Care - HMO

## 2021-09-11 ENCOUNTER — Telehealth: Payer: Self-pay

## 2021-09-11 ENCOUNTER — Ambulatory Visit (INDEPENDENT_AMBULATORY_CARE_PROVIDER_SITE_OTHER): Payer: Medicare Other

## 2021-09-11 DIAGNOSIS — I639 Cerebral infarction, unspecified: Secondary | ICD-10-CM | POA: Diagnosis not present

## 2021-09-11 LAB — CUP PACEART REMOTE DEVICE CHECK
Date Time Interrogation Session: 20230202085309
Implantable Pulse Generator Implant Date: 20201014

## 2021-09-11 NOTE — Telephone Encounter (Signed)
°  Care Management   Follow Up Note   09/11/2021 Name: Trevor Murphy MRN: [id number] DOB: 09-21-1934   Referred by: [name], FNP Reason for referral : Chronic Care Management (Unsuccessful call)   Third unsuccessful telephone outreach was attempted today. The patient was referred to the case management team for assistance with care management and care coordination. The patient's primary care provider has been notified of our unsuccessful attempts to make or maintain contact with the patient. The care management team is pleased to engage with this patient at any time in the future should he/she be interested in assistance from the care management team.   Follow Up Plan:  No SW follow up planned at this time due to inability to establish patient contact.  [name], BSW, CDP Social Worker, Certified Dementia Practitioner [place] [facility] [place] [facility] [phone]

## 2021-09-15 ENCOUNTER — Telehealth: Payer: Self-pay

## 2021-09-15 ENCOUNTER — Telehealth: Payer: Medicaid Other

## 2021-09-15 NOTE — Telephone Encounter (Signed)
°  Care Management   Follow Up Note   09/15/2021 Name: Trevor Murphy MRN: [id number] DOB: 05-17-1935   Referred by: [name], FNP Reason for referral : Chronic Care Management (RN CM Follow up call )   An unsuccessful telephone outreach was attempted today. The patient was referred to the case management team for assistance with care management and care coordination.   Follow Up Plan: A HIPPA compliant phone message was left for the patient providing contact information and requesting a return call.   Barb Merino, RN, BSN, CCM Care Management Coordinator [place] [facility]  Direct Phone: [phone]

## 2021-09-17 ENCOUNTER — Ambulatory Visit (INDEPENDENT_AMBULATORY_CARE_PROVIDER_SITE_OTHER): Payer: Medicare Other

## 2021-09-17 VITALS — Ht 70.0 in | Wt 130.0 lb

## 2021-09-17 DIAGNOSIS — Z0000 Encounter for general adult medical examination without abnormal findings: Secondary | ICD-10-CM | POA: Diagnosis not present

## 2021-09-17 NOTE — Patient Instructions (Signed)
Trevor Murphy , Thank you for taking time to come for your Medicare Wellness Visit. I appreciate your ongoing commitment to your health goals. Please review the following plan we discussed and let me know if I can assist you in the future.   Screening recommendations/referrals: Colonoscopy: not required Recommended yearly ophthalmology/optometry visit for glaucoma screening and checkup Recommended yearly dental visit for hygiene and checkup  Vaccinations: Influenza vaccine: decline Pneumococcal vaccine: decline Tdap vaccine: completed 06/21/2013, due 06/22/2023 Shingles vaccine: decline   Covid-19:  09/21/2019, 08/20/2020  Advanced directives: Advance directive discussed with you today.   Conditions/risks identified: none  Next appointment: Follow up in one year for your annual wellness visit.   Preventive Care 85 Years and Older, Male Preventive care refers to lifestyle choices and visits with your health care provider that can promote health and wellness. What does preventive care include? A yearly physical exam. This is also called an annual well check. Dental exams once or twice a year. Routine eye exams. Ask your health care provider how often you should have your eyes checked. Personal lifestyle choices, including: Daily care of your teeth and gums. Regular physical activity. Eating a healthy diet. Avoiding tobacco and drug use. Limiting alcohol use. Practicing safe sex. Taking low doses of aspirin every day. Taking vitamin and mineral supplements as recommended by your health care provider. What happens during an annual well check? The services and screenings done by your health care provider during your annual well check will depend on your age, overall health, lifestyle risk factors, and family history of disease. Counseling  Your health care provider may ask you questions about your: Alcohol use. Tobacco use. Drug use. Emotional well-being. Home and relationship  well-being. Sexual activity. Eating habits. History of falls. Memory and ability to understand (cognition). Work and work Statistician. Screening  You may have the following tests or measurements: Height, weight, and BMI. Blood pressure. Lipid and cholesterol levels. These may be checked every 5 years, or more frequently if you are over 36 years old. Skin check. Lung cancer screening. You may have this screening every year starting at age 85 if you have a 30-pack-year history of smoking and currently smoke or have quit within the past 15 years. Fecal occult blood test (FOBT) of the stool. You may have this test every year starting at age 85. Flexible sigmoidoscopy or colonoscopy. You may have a sigmoidoscopy every 5 years or a colonoscopy every 10 years starting at age 85. Prostate cancer screening. Recommendations will vary depending on your family history and other risks. Hepatitis C blood test. Hepatitis B blood test. Sexually transmitted disease (STD) testing. Diabetes screening. This is done by checking your blood sugar (glucose) after you have not eaten for a while (fasting). You may have this done every 1-3 years. Abdominal aortic aneurysm (AAA) screening. You may need this if you are a current or former smoker. Osteoporosis. You may be screened starting at age 40 if you are at high risk. Talk with your health care provider about your test results, treatment options, and if necessary, the need for more tests. Vaccines  Your health care provider may recommend certain vaccines, such as: Influenza vaccine. This is recommended every year. Tetanus, diphtheria, and acellular pertussis (Tdap, Td) vaccine. You may need a Td booster every 10 years. Zoster vaccine. You may need this after age 85. Pneumococcal 13-valent conjugate (PCV13) vaccine. One dose is recommended after age 85. Pneumococcal polysaccharide (PPSV23) vaccine. One dose is recommended after age 85.  Talk to your health care  provider about which screenings and vaccines you need and how often you need them. This information is not intended to replace advice given to you by your health care provider. Make sure you discuss any questions you have with your health care provider. Document Released: 08/23/2015 Document Revised: 04/15/2016 Document Reviewed: 05/28/2015 Elsevier Interactive Patient Education  2017 [place] Prevention in the Home Falls can cause injuries. They can happen to people of all ages. There are many things you can do to make your home safe and to help prevent falls. What can I do on the outside of my home? Regularly fix the edges of walkways and driveways and fix any cracks. Remove anything that might make you trip as you walk through a door, such as a raised step or threshold. Trim any bushes or trees on the path to your home. Use bright outdoor lighting. Clear any walking paths of anything that might make someone trip, such as rocks or tools. Regularly check to see if handrails are loose or broken. Make sure that both sides of any steps have handrails. Any raised decks and porches should have guardrails on the edges. Have any leaves, snow, or ice cleared regularly. Use sand or salt on walking paths during winter. Clean up any spills in your garage right away. This includes oil or grease spills. What can I do in the bathroom? Use night lights. Install grab bars by the toilet and in the tub and shower. Do not use towel bars as grab bars. Use non-skid mats or decals in the tub or shower. If you need to sit down in the shower, use a plastic, non-slip stool. Keep the floor dry. Clean up any water that spills on the floor as soon as it happens. Remove soap buildup in the tub or shower regularly. Attach bath mats securely with double-sided non-slip rug tape. Do not have throw rugs and other things on the floor that can make you trip. What can I do in the bedroom? Use night lights. Make  sure that you have a light by your bed that is easy to reach. Do not use any sheets or blankets that are too big for your bed. They should not hang down onto the floor. Have a firm chair that has side arms. You can use this for support while you get dressed. Do not have throw rugs and other things on the floor that can make you trip. What can I do in the kitchen? Clean up any spills right away. Avoid walking on wet floors. Keep items that you use a lot in easy-to-reach places. If you need to reach something above you, use a strong step stool that has a grab bar. Keep electrical cords out of the way. Do not use floor polish or wax that makes floors slippery. If you must use wax, use non-skid floor wax. Do not have throw rugs and other things on the floor that can make you trip. What can I do with my stairs? Do not leave any items on the stairs. Make sure that there are handrails on both sides of the stairs and use them. Fix handrails that are broken or loose. Make sure that handrails are as long as the stairways. Check any carpeting to make sure that it is firmly attached to the stairs. Fix any carpet that is loose or worn. Avoid having throw rugs at the top or bottom of the stairs. If you do have throw  rugs, attach them to the floor with carpet tape. Make sure that you have a light switch at the top of the stairs and the bottom of the stairs. If you do not have them, ask someone to add them for you. What else can I do to help prevent falls? Wear shoes that: Do not have high heels. Have rubber bottoms. Are comfortable and fit you well. Are closed at the toe. Do not wear sandals. If you use a stepladder: Make sure that it is fully opened. Do not climb a closed stepladder. Make sure that both sides of the stepladder are locked into place. Ask someone to hold it for you, if possible. Clearly mark and make sure that you can see: Any grab bars or handrails. First and last steps. Where the  edge of each step is. Use tools that help you move around (mobility aids) if they are needed. These include: Canes. Walkers. Scooters. Crutches. Turn on the lights when you go into a dark area. Replace any light bulbs as soon as they burn out. Set up your furniture so you have a clear path. Avoid moving your furniture around. If any of your floors are uneven, fix them. If there are any pets around you, be aware of where they are. Review your medicines with your doctor. Some medicines can make you feel dizzy. This can increase your chance of falling. Ask your doctor what other things that you can do to help prevent falls. This information is not intended to replace advice given to you by your health care provider. Make sure you discuss any questions you have with your health care provider. Document Released: 05/23/2009 Document Revised: 01/02/2016 Document Reviewed: 08/31/2014 Elsevier Interactive Patient Education  2017 [employer].

## 2021-09-17 NOTE — Progress Notes (Signed)
I connected with Trevor Murphy today by telephone and verified that I am speaking with the correct person using two identifiers. Location patient: home Location provider: work Persons participating in the virtual visit: Trevor Murphy, Glenna Durand LPN.   I discussed the limitations, risks, security and privacy concerns of performing an evaluation and management service by telephone and the availability of in person appointments. I also discussed with the patient that there may be a patient responsible charge related to this service. The patient expressed understanding and verbally consented to this telephonic visit.    Interactive audio and video telecommunications were attempted between this provider and patient, however failed, due to patient having technical difficulties OR patient did not have access to video capability.  We continued and completed visit with audio only.     Vital signs may be patient reported or missing.  Subjective:   Trevor Murphy is a 87 y.o. male who presents for Medicare Annual/Subsequent preventive examination.  Review of Systems     Cardiac Risk Factors include: advanced age (>60men, >72 women);dyslipidemia;hypertension;male gender     Objective:    Today's Vitals   09/17/21 1138  Weight: 130 lb (59 kg)  Height: 5\' 10"  (1.778 m)   Body mass index is 18.65 kg/m.  Advanced Directives 09/17/2021 03/03/2021 09/12/2020 08/12/2020 08/08/2019 05/24/2019 07/22/2018  Does Patient Have a Medical Advance Directive? No No No Yes Yes No No  Type of Advance Directive - - - Living will [place] - -  Does patient want to make changes to medical advance directive? - - - No - Patient declined - - -  Copy of [place] in Chart? - - - - No - copy requested - -  Would patient like information on creating a medical advance directive? - Yes (MAU/Ambulatory/Procedural Areas - Information given) - - - - No - Patient declined  Pre-existing out of  facility DNR order (yellow form or pink MOST form) - - - - - - -    Current Medications (verified) Outpatient Encounter Medications as of 09/17/2021  Medication Sig   acetaminophen (TYLENOL) 650 MG CR tablet Take 650 mg by mouth every 8 (eight) hours as needed for pain.   apixaban (ELIQUIS) 2.5 MG TABS tablet TAKE 1 TABLET(2.5 MG) BY MOUTH TWICE DAILY   atorvastatin (LIPITOR) 80 MG tablet TAKE 1 TABLET(80 MG) BY MOUTH DAILY AT 6 PM   FEROSUL 325 (65 Fe) MG tablet TAKE 1 TABLET(325 MG) BY MOUTH DAILY   LUMIGAN 0.01 % SOLN Place 1 drop into both eyes at bedtime.   SIMBRINZA 1-0.2 % SUSP Apply to eye.   No facility-administered encounter medications on file as of 09/17/2021.    Allergies (verified) Patient has no known allergies.   History: Past Medical History:  Diagnosis Date   Arthritis    CAD (coronary artery disease)    a. Cath 08/2009: minimal nonobstructive disease (25% LAD). b. Lexiscan nuc 6/215: EF 54%, no ischemia or infarction.   Chronic diastolic CHF (congestive heart failure) ([place])    a. Echo 2013: EF 55-60%, mild AI, mild MR. b. Echo 5/15 with EF 60-65%, no WMA, mild LVH, mild AI.   CKD (chronic kidney disease) stage 3, GFR 30-59 ml/min (HCC) 07/26/2012   a. Felt likely due to HTN.   CVA (cerebral infarction) 07/28/2012   Glaucoma    Gout    H/O umbilical hernia repair    Headache(784.0)    " WHEN MY BLOOD PRESSURE  IS UP"   History of acute renal failure    History of alcohol abuse    HTN (hypertension)    Hypertension    a. Patent renal arteries 2005.   Noncompliance    OA (osteoarthritis)    Other and unspecified hyperlipidemia    Poor circulation    Sinus bradycardia    SVT (supraventricular tachycardia) ([place])    a. Possible AVNRT.  Got adenosine from EMS in 2015 for SVT.  During 5/15 hospitalization had episode of short R-P tachycardia (possible AVNRT) terminated by adenosine.  b. 04/2016 - recurrent tachycardia (SVT vs. AVNRT), again terminated by Adenosine    Visual impairment    Past Surgical History:  Procedure Laterality Date   APPENDECTOMY     BIOPSY  08/12/2020   Procedure: BIOPSY;  Surgeon: Juanita Craver, MD;  Location: [facility] ENDOSCOPY;  Service: Endoscopy;;   BUBBLE STUDY  05/24/2019   Procedure: BUBBLE STUDY;  Surgeon: Donato Heinz, MD;  Location: [facility] ENDOSCOPY;  Service: Endoscopy;;   CARDIAC CATHETERIZATION  11/2003   EF 50-55%   CARDIAC CATHETERIZATION  08/2009   CATARACT EXTRACTION EXTRACAPSULAR Right 05/17/2013   Procedure: CATARACT EXTRACTION EXTRACAPSULAR WITH INTRAOCULAR LENS PLACEMENT ([place]) RIGHT EYE;  Surgeon: Marylynn Pearson, MD;  Location: [facility];  Service: Ophthalmology;  Laterality: Right;   COLONOSCOPY     ESOPHAGOGASTRODUODENOSCOPY (EGD) WITH PROPOFOL N/A 08/12/2020   Procedure: ESOPHAGOGASTRODUODENOSCOPY (EGD) WITH PROPOFOL;  Surgeon: Juanita Craver, MD;  Location: [facility] ENDOSCOPY;  Service: Endoscopy;  Laterality: N/A;   HERNIA REPAIR     LOOP RECORDER INSERTION N/A 05/24/2019   Procedure: LOOP RECORDER INSERTION;  Surgeon: Constance Haw, MD;  Location: [facility] CV LAB;  Service: Cardiovascular;  Laterality: N/A;   TEE WITHOUT CARDIOVERSION N/A 05/24/2019   Procedure: TRANSESOPHAGEAL ECHOCARDIOGRAM (TEE);  Surgeon: Donato Heinz, MD;  Location: [facility] ENDOSCOPY;  Service: Endoscopy;  Laterality: N/A;   VENTRAL HERNIA REPAIR  03/07/2012   Procedure: LAPAROSCOPIC VENTRAL HERNIA;  Surgeon: Gwenyth Ober, MD;  Location: [facility];  Service: General;  Laterality: N/A;   Family History  Problem Relation Age of Onset   CVA Mother    Hypertension Brother    Hypertension Sister    Hypertension Brother    Stroke Sister    Heart attack Neg Hx    Social History   Socioeconomic History   Marital status: Married    Spouse name: Not on file   Number of children: Not on file   Years of education: Not on file   Highest education level: Not on file  Occupational History   Occupation: retired  Tobacco Use   Smoking  status: Never   Smokeless tobacco: Never  Vaping Use   Vaping Use: Never used  Substance and Sexual Activity   Alcohol use: No    Alcohol/week: 1.0 standard drink    Types: 1 Cans of beer per week    Comment: h/o heavy use, quit in 2017   Drug use: No   Sexual activity: Not Currently  Other Topics Concern   Not on file  Social History Narrative   Not on file   Social Determinants of Health   Financial Resource Strain: Low Risk    Difficulty of Paying Living Expenses: Not hard at all  Food Insecurity: No Food Insecurity   Worried About Charity fundraiser in the Last Year: Never true   Albers in the Last Year: Never true  Transportation Needs: No Transportation Needs  Lack of Transportation (Medical): No   Lack of Transportation (Non-Medical): No  Physical Activity: Inactive   Days of Exercise per Week: 0 days   Minutes of Exercise per Session: 0 min  Stress: No Stress Concern Present   Feeling of Stress : Not at all  Social Connections: Moderately Isolated   Frequency of Communication with Friends and Family: More than three times a week   Frequency of Social Gatherings with Friends and Family: More than three times a week   Attends Religious Services: More than 4 times per year   Active Member of [employer] or Organizations: No   Attends Archivist Meetings: Never   Marital Status: Widowed    Tobacco Counseling Counseling given: Not Answered   Clinical Intake:  Pre-visit preparation completed: Yes  Pain : No/denies pain     Nutritional Status: BMI <19  Underweight Nutritional Risks: None Diabetes: No  How often do you need to have someone help you when you read instructions, pamphlets, or other written materials from your doctor or pharmacy?: 1 - Never  Diabetic? No   Interpreter Needed?: No  Information entered by :: NAllen LPN   Activities of Daily Living In your present state of health, do you have any difficulty performing the  following activities: 09/17/2021 03/03/2021  Hearing? N Y  Vision? 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 Y N  Walking or climbing stairs? Y Y  Dressing or bathing? N N  Doing errands, shopping? Y -  Conservation officer, nature and eating ? Y -  Using the Toilet? N -  In the past six months, have you accidently leaked urine? Y -  Comment a little -  Do you have problems with loss of bowel control? N -  Managing your Medications? Y -  Managing your Finances? Y -  Housekeeping or managing your Housekeeping? Y -  Some recent data might be hidden    Patient Care Team: [name], FNP as PCP - General ([place]) [place], [name], RN as [place] any recent [place] you may have received from other than [facility] providers in the past year (date may be approximate).     Assessment:   This is a routine wellness examination for Trevor Murphy.  Hearing/Vision screen Vision Screening - Comments:: Regular eye exams, Dr. Venetia Maxon  Dietary issues and exercise activities discussed: Current Exercise Habits: The patient does not participate in regular exercise at present   Goals Addressed             This Visit's Progress    Patient Stated       09/17/2021, no goals       Depression Screen PHQ 2/9 Scores 09/17/2021 09/12/2020 11/07/2019 08/08/2019 07/05/2019 11/21/2018 07/22/2018  PHQ - 2 Score 0 0 0 0 0 0 0  PHQ- 9 Score - - - 0 - - 0    Fall Risk Fall Risk  09/17/2021 09/12/2020 11/07/2019 11/07/2019 08/08/2019  Falls in the past year? 0 0 0 0 0  Number falls in past yr: - - - - -  Comment - - - - -  Injury with Fall? - - - - -  Comment - - - - -  Risk for fall due to : Impaired balance/gait;Impaired mobility;Medication side effect;Impaired vision Impaired mobility;Impaired balance/gait;Medication side effect - - Impaired balance/gait;Impaired mobility  Follow up Falls  evaluation completed;Education  provided;Falls prevention discussed Falls evaluation completed;Education provided;Falls prevention discussed - - Education provided;Falls prevention discussed    FALL RISK PREVENTION PERTAINING TO THE HOME:  Any stairs in or around the home? No  If so, are there any without handrails?  N/a Home free of loose throw rugs in walkways, pet beds, electrical cords, etc? Yes  Adequate lighting in your home to reduce risk of falls? Yes   ASSISTIVE DEVICES UTILIZED TO PREVENT FALLS:  Life alert? No  Use of a cane, walker or w/c? Yes  Grab bars in the bathroom? Yes  Shower chair or bench in shower? Yes  Elevated toilet seat or a handicapped toilet? No   TIMED UP AND GO:  Was the test performed? No .      Cognitive Function:     6CIT Screen 09/17/2021 09/12/2020 08/08/2019 07/22/2018  What Year? 0 points 4 points 0 points 4 points  What month? 0 points 0 points 0 points 0 points  What time? 0 points 3 points 3 points 0 points  Count back from 20 0 points 0 points 0 points 0 points  Months in reverse 4 points 4 points 4 points 2 points  Repeat phrase 10 points 10 points 10 points 0 points  Total Score 14 21 17 6     Immunizations Immunization History  Administered Date(s) Administered   PFIZER Comirnaty(Gray Top)Covid-19 Tri-Sucrose Vaccine 09/10/2020   PFIZER(Purple Top)SARS-COV-2 Vaccination 08/20/2020    TDAP status: Up to date  Flu Vaccine status: Declined, Education has been provided regarding the importance of this vaccine but patient still declined. Advised may receive this vaccine at local pharmacy or Health Dept. Aware to provide a copy of the vaccination record if obtained from local pharmacy or Health Dept. Verbalized acceptance and understanding.  Pneumococcal vaccine status: Declined,  Education has been provided regarding the importance of this vaccine but patient still declined. Advised may receive this vaccine at local pharmacy or Health  Dept. Aware to provide a copy of the vaccination record if obtained from local pharmacy or Health Dept. Verbalized acceptance and understanding.   Covid-19 vaccine status: Completed vaccines  Qualifies for Shingles Vaccine? Yes   Zostavax completed No   Shingrix Completed?: No.    Education has been provided regarding the importance of this vaccine. Patient has been advised to call insurance company to determine out of pocket expense if they have not yet received this vaccine. Advised may also receive vaccine at local pharmacy or Health Dept. Verbalized acceptance and understanding.  Screening Tests Health Maintenance  Topic Date Due   OPHTHALMOLOGY EXAM  04/29/2019   COVID-19 Vaccine (3 - Pfizer risk series) 10/03/2021 (Originally 10/08/2020)   INFLUENZA VACCINE  11/07/2021 (Originally 03/10/2021)   Zoster Vaccines- Shingrix (1 of 2) 11/17/2021 (Originally 03/21/1954)   Pneumonia Vaccine 56+ Years old (1 - PCV) 08/19/2022 (Originally 03/22/1941)   FOOT EXAM  09/25/2021   HEMOGLOBIN A1C  10/13/2021   URINE MICROALBUMIN  04/15/2022   TETANUS/TDAP  06/22/2023   HPV VACCINES  Aged Out    Health Maintenance  Health Maintenance Due  Topic Date Due   OPHTHALMOLOGY EXAM  04/29/2019    Colorectal Murphy screening: No longer required.   Lung Murphy Screening: (Low Dose CT Chest recommended if Age 68-80 years, 30 pack-year currently smoking OR have quit w/in 15years.) does not qualify.   Lung Murphy Screening Referral: no  Additional Screening:  Hepatitis C Screening: does not qualify; Completed no  Vision Screening: Recommended annual ophthalmology exams for  early detection of glaucoma and other disorders of the eye. Is the patient up to date with their annual eye exam?  Yes  Who is the provider or what is the name of the office in which the patient attends annual eye exams? Dr. Venetia Maxon  If pt is not established with a provider, would they like to be referred to a provider to establish  care? No .   Dental Screening: Recommended annual dental exams for proper oral hygiene  Community Resource Referral / Chronic Care Management: CRR required this visit?  No   CCM required this visit?  No      Plan:     I have personally reviewed and noted the following in the patients chart:   Medical and social history Use of alcohol, tobacco or illicit drugs  Current medications and supplements including opioid prescriptions. Patient is not currently taking opioid prescriptions. Functional ability and status Nutritional status Physical activity Advanced directives List of other physicians Hospitalizations, surgeries, and ER visits in previous 12 months Vitals Screenings to include cognitive, depression, and falls Referrals and appointments  In addition, I have reviewed and discussed with patient certain preventive protocols, quality metrics, and best practice recommendations. A written personalized care plan for preventive services as well as general preventive health recommendations were provided to patient.     Kellie Simmering, LPN   09/16/2534   Nurse Notes: none  Due to this being a virtual visit, the after visit summary with patients personalized plan was offered to patient via mail or my-chart. per request, patient was mailed a copy of AVS.

## 2021-09-17 NOTE — Progress Notes (Signed)
Carelink Summary Report / Loop Recorder 

## 2021-09-25 ENCOUNTER — Telehealth: Payer: Medicare Other

## 2021-09-25 ENCOUNTER — Telehealth: Payer: Self-pay

## 2021-09-25 NOTE — Telephone Encounter (Signed)
°  Care Management   Follow Up Note   09/25/2021 Name: NIV DARLEY MRN: [id number] DOB: [birth year]   Referred by: [name], FNP Reason for referral : Chronic Care Management (RN CM Follow up - #2)   A second unsuccessful telephone outreach was attempted today. The patient was referred to the case management team for assistance with care management and care coordination.   Follow Up Plan: A HIPPA compliant phone message was left for the patient providing contact information and requesting a return call.   Barb Merino, RN, BSN, CCM Care Management Coordinator [place] [facility]  Direct Phone: [phone]

## 2021-10-13 ENCOUNTER — Other Ambulatory Visit: Payer: Self-pay

## 2021-10-13 DIAGNOSIS — D509 Iron deficiency anemia, unspecified: Secondary | ICD-10-CM

## 2021-10-13 MED ORDER — FERROUS SULFATE 325 (65 FE) MG PO TABS: ORAL_TABLET | ORAL | 1 refills | Status: DC

## 2021-10-14 ENCOUNTER — Ambulatory Visit (INDEPENDENT_AMBULATORY_CARE_PROVIDER_SITE_OTHER): Payer: Medicare Other

## 2021-10-14 DIAGNOSIS — I639 Cerebral infarction, unspecified: Secondary | ICD-10-CM

## 2021-10-14 LAB — CUP PACEART REMOTE DEVICE CHECK
Date Time Interrogation Session: 20230306230941
Implantable Pulse Generator Implant Date: 20201014

## 2021-10-17 ENCOUNTER — Encounter (HOSPITAL_COMMUNITY): Payer: Self-pay | Admitting: Emergency Medicine

## 2021-10-17 ENCOUNTER — Ambulatory Visit (INDEPENDENT_AMBULATORY_CARE_PROVIDER_SITE_OTHER): Payer: Medicare Other | Admitting: Podiatry

## 2021-10-17 ENCOUNTER — Other Ambulatory Visit: Payer: Self-pay

## 2021-10-17 ENCOUNTER — Ambulatory Visit (HOSPITAL_COMMUNITY)
Admission: EM | Admit: 2021-10-17 | Discharge: 2021-10-17 | Disposition: A | Discharge status: Home / Self Care | Payer: Medicare Other | Attending: Nurse Practitioner | Admitting: Nurse Practitioner

## 2021-10-17 ENCOUNTER — Telehealth: Payer: Self-pay

## 2021-10-17 DIAGNOSIS — I739 Peripheral vascular disease, unspecified: Secondary | ICD-10-CM

## 2021-10-17 DIAGNOSIS — B351 Tinea unguium: Secondary | ICD-10-CM

## 2021-10-17 DIAGNOSIS — R310 Gross hematuria: Secondary | ICD-10-CM | POA: Insufficient documentation

## 2021-10-17 DIAGNOSIS — N183 Chronic kidney disease, stage 3 unspecified: Secondary | ICD-10-CM | POA: Diagnosis not present

## 2021-10-17 DIAGNOSIS — M79675 Pain in left toe(s): Secondary | ICD-10-CM

## 2021-10-17 DIAGNOSIS — M79674 Pain in right toe(s): Secondary | ICD-10-CM

## 2021-10-17 DIAGNOSIS — R350 Frequency of micturition: Secondary | ICD-10-CM | POA: Diagnosis not present

## 2021-10-17 LAB — POCT URINALYSIS DIPSTICK, ED / UC
Glucose, UA: NEGATIVE mg/dL
Ketones, ur: 15 mg/dL — AB
Nitrite: NEGATIVE
Protein, ur: >=300 mg/dL — AB
Specific Gravity, Urine: 1.015 (ref 1.005–1.030)
Urobilinogen, UA: 2 mg/dL — ABNORMAL HIGH (ref 0.0–1.0)
pH: 5 (ref 5.0–8.0)

## 2021-10-17 MED ORDER — SULFAMETHOXAZOLE-TRIMETHOPRIM 400-80 MG PO TABS: 1.0000 | ORAL_TABLET | Freq: Two times a day (BID) | ORAL | 0 refills | Status: AC

## 2021-10-17 NOTE — ED Provider Notes (Signed)
[facility]    CSN: [id number] Arrival date & time: 10/17/21  1149      History   Chief Complaint Chief Complaint  Patient presents with   Hematuria    HPI Trevor Murphy is a 87 y.o. male.   Patient presents with sister who is primary caregiver.  Sister reports 2 day history of blood in his urine.  Patient denies painful urination, urgency, voiding smaller amounts, change in the odor of his urine, abdominal pain, back pain, fevers, body aches, chills, nausea, vomiting, and back pain.  He does report seeing blood in his urine, increased urinary frequency, and urinary incontinence which is the baseline for the patient.  He has not tried anything for his symptoms.    The patient does have a history of atrial fibrillation, history of stroke, and takes Eliquis twice daily.      Past Medical History:  Diagnosis Date   Arthritis    CAD (coronary artery disease)    a. Cath 08/2009: minimal nonobstructive disease (25% LAD). b. Lexiscan nuc 6/215: EF 54%, no ischemia or infarction.   Chronic diastolic CHF (congestive heart failure) ([place])    a. Echo 2013: EF 55-60%, mild AI, mild MR. b. Echo 5/15 with EF 60-65%, no WMA, mild LVH, mild AI.   CKD (chronic kidney disease) stage 3, GFR 30-59 ml/min (HCC) 07/26/2012   a. Felt likely due to HTN.   CVA (cerebral infarction) 07/28/2012   Glaucoma    Gout    H/O umbilical hernia repair    Headache(784.0)    " WHEN MY BLOOD PRESSURE IS UP"   History of acute renal failure    History of alcohol abuse    HTN (hypertension)    Hypertension    a. Patent renal arteries 2005.   Noncompliance    OA (osteoarthritis)    Other and unspecified hyperlipidemia    Poor circulation    Sinus bradycardia    SVT (supraventricular tachycardia) ([place])    a. Possible AVNRT.  Got adenosine from EMS in 2015 for SVT.  During 5/15 hospitalization had episode of short R-P tachycardia (possible AVNRT) terminated by adenosine.  b. 04/2016 - recurrent  tachycardia (SVT vs. AVNRT), again terminated by Adenosine   Visual impairment     Patient Active Problem List   Diagnosis Date Noted   PVD (peripheral vascular disease) ([place]) 08/19/2021   Mixed hyperlipidemia 08/12/2020   Acute blood loss anemia 08/11/2020   Paroxysmal atrial fibrillation ([place]) 06/07/2020   Secondary hypercoagulable state ([place]) 06/07/2020   Ischemic cerebrovascular accident (CVA) of frontal lobe ([place]) 05/20/2019   Pain due to onychomycosis of toenails of both feet 01/20/2019   Prediabetes 07/22/2018   Nail abnormality 07/22/2018   Sinus bradycardia 05/02/2016   Hypotension due to drugs 05/02/2016   History of tachycardia 05/02/2016   GERD (gastroesophageal reflux disease) 02/05/2015   Coronary artery disease involving native coronary artery of native heart without angina pectoris 04/20/2014   Atypical chest pain 04/12/2014   Hypertensive urgency 03/11/2014   Chronic diastolic CHF (congestive heart failure) ([place]) 01/30/2014   Alcohol abuse 01/19/2014   Elevated troponin level not due myocardial infarction 12/19/2013   Gout 12/19/2013   Dyslipidemia 12/16/2013   Hypertensive crisis 12/16/2013   Cerebral infarction ([place]) 07/28/2012   Acute on chronic renal failure ([place]) 07/27/2012   Essential hypertension 07/26/2012   Chronic kidney disease, stage 3b ([place]) 07/26/2012   Poor circulation    Recurrent ventral hernia 02/16/2012   Hernia (  acquired) (recurrent) 02/10/2011    Past Surgical History:  Procedure Laterality Date   APPENDECTOMY     BIOPSY  08/12/2020   Procedure: BIOPSY;  Surgeon: Juanita Craver, MD;  Location: [facility]COPY;  Service: Endoscopy;;   BUBBLE STUDY  05/24/2019   Procedure: BUBBLE STUDY;  Surgeon: Donato Heinz, MD;  Location: [facility];  Service: Endoscopy;;   CARDIAC CATHETERIZATION  11/2003   EF 50-55%   CARDIAC CATHETERIZATION  08/2009   CATARACT EXTRACTION EXTRACAPSULAR Right 05/17/2013   Procedure: CATARACT EXTRACTION  EXTRACAPSULAR WITH INTRAOCULAR LENS PLACEMENT ([place]) RIGHT EYE;  Surgeon: Marylynn Pearson, MD;  Location: [facility];  Service: Ophthalmology;  Laterality: Right;   COLONOSCOPY     ESOPHAGOGASTRODUODENOSCOPY (EGD) WITH PROPOFOL N/A 08/12/2020   Procedure: ESOPHAGOGASTRODUODENOSCOPY (EGD) WITH PROPOFOL;  Surgeon: Juanita Craver, MD;  Location: [facility] ENDOSCOPY;  Service: Endoscopy;  Laterality: N/A;   HERNIA REPAIR     LOOP RECORDER INSERTION N/A 05/24/2019   Procedure: LOOP RECORDER INSERTION;  Surgeon: Constance Haw, MD;  Location: [facility];  Service: Cardiovascular;  Laterality: N/A;   TEE WITHOUT CARDIOVERSION N/A 05/24/2019   Procedure: TRANSESOPHAGEAL ECHOCARDIOGRAM (TEE);  Surgeon: Donato Heinz, MD;  Location: [facility] ENDOSCOPY;  Service: Endoscopy;  Laterality: N/A;   VENTRAL HERNIA REPAIR  03/07/2012   Procedure: LAPAROSCOPIC VENTRAL HERNIA;  Surgeon: Gwenyth Ober, MD;  Location: [facility];  Service: General;  Laterality: N/A;       Home Medications    Prior to Admission medications   Medication Sig Start Date End Date Taking? Authorizing Provider  apixaban (ELIQUIS) 2.5 MG TABS tablet TAKE 1 TABLET(2.5 MG) BY MOUTH TWICE DAILY 08/08/21  Yes Fenton, Clint R, PA  atorvastatin (LIPITOR) 80 MG tablet TAKE 1 TABLET(80 MG) BY MOUTH DAILY AT 6 PM 09/09/21  Yes [name], FNP  ferrous sulfate (FEROSUL) 325 (65 FE) MG tablet TAKE 1 TABLET(325 MG) BY MOUTH DAILY 10/13/21  Yes [name], FNP  LUMIGAN 0.01 % SOLN Place 1 drop into both eyes at bedtime. 08/28/19  Yes [provider]  sulfamethoxazole-trimethoprim (BACTRIM) 400-80 MG tablet Take 1 tablet by mouth every 12 (twelve) hours for 14 days. 10/17/21 10/31/21 Yes Eulogio Bear, NP  acetaminophen (TYLENOL) 650 MG CR tablet Take 650 mg by mouth every 8 (eight) hours as needed for pain.    [provider]  SIMBRINZA 1-0.2 % SUSP Apply to eye. 07/14/21   [provider]    Family History Family History   Problem Relation Age of Onset   CVA Mother    Hypertension Brother    Hypertension Sister    Hypertension Brother    Stroke Sister    Heart attack Neg Hx     Social History Social History   Tobacco Use   Smoking status: Never   Smokeless tobacco: Never  Vaping Use   Vaping Use: Never used  Substance Use Topics   Alcohol use: No    Alcohol/week: 1.0 standard drink    Types: 1 Cans of beer per week    Comment: h/o heavy use, quit in 2017   Drug use: No     Allergies   Patient has no known allergies.   Review of Systems Review of Systems Per HPI  Physical Exam Triage Vital Signs ED Triage Vitals  Enc Vitals Group     BP 10/17/21 1214 (!) 197/78     Pulse Rate 10/17/21 1214 71     Resp 10/17/21 1214 16  Temp 10/17/21 1214 98.1 F (36.7 C)     Temp Source 10/17/21 1214 Oral     SpO2 10/17/21 1214 100 %     Weight --      Height --      Head Circumference --      Peak Flow --      Pain Score 10/17/21 1213 0     Pain Loc --      Pain Edu? --      Excl. in [place]? --    No data found.  Updated Vital Signs BP (!) 180/74    Pulse 71    Temp 98.1 F (36.7 C) (Oral)    Resp 16    SpO2 100%   Visual Acuity Right Eye Distance:   Left Eye Distance:   Bilateral Distance:    Right Eye Near:   Left Eye Near:    Bilateral Near:     Physical Exam Vitals and nursing note reviewed.  Constitutional:      General: He is not in acute distress.    Appearance: Normal appearance. He is not toxic-appearing.  HENT:     Head: Normocephalic and atraumatic.  Cardiovascular:     Rate and Rhythm: Normal rate and regular rhythm.  Pulmonary:     Effort: Pulmonary effort is normal. No respiratory distress.     Breath sounds: Normal breath sounds. No wheezing, rhonchi or rales.  Abdominal:     General: Abdomen is flat. Bowel sounds are normal. There is no distension.     Palpations: Abdomen is soft. There is no mass.     Tenderness: There is no abdominal tenderness.  There is no right CVA tenderness, left CVA tenderness or guarding.  Musculoskeletal:     Right lower leg: No edema.     Left lower leg: No edema.  Skin:    General: Skin is warm and dry.     Coloration: Skin is not jaundiced or pale.     Findings: No erythema.  Neurological:     Mental Status: He is alert and oriented to person, place, and time.     Motor: No weakness.     Gait: Gait normal.  Psychiatric:        Mood and Affect: Mood normal.     UC Treatments / Results  Labs (all labs ordered are listed, but only abnormal results are displayed) Labs Reviewed  POCT URINALYSIS DIPSTICK, ED / UC - Abnormal; Notable for the following components:      Result Value   Bilirubin Urine LARGE (*)    Ketones, ur 15 (*)    Hgb urine dipstick LARGE (*)    Protein, ur >=300 (*)    Urobilinogen, UA 2.0 (*)    Leukocytes,Ua LARGE (*)    All other components within normal limits  URINE CULTURE    EKG   Radiology No results found.  Procedures Procedures (including critical care time)  Medications Ordered in UC Medications - No data to display  Initial Impression / Assessment and Plan / UC Course  I have reviewed the triage vital signs and the nursing notes.  Pertinent labs & imaging results that were available during my care of the patient were reviewed by me and considered in my medical decision making (see chart for details).    UA dipstick today shows large amount of hemoglobin, large amount of leukocytes.  Suspect UTI may be cause of hematuria.  Will send urine for culture and in meantime  treat with renally-dosed Bactrim SS twice daily for 14 days.  I encouraged the patient to follow up with PCP within the next 2 weeks; if symptoms improve then can cancel that appointment but if they worsen or persist, will want PCP follow up.  If severe back pain, nausea/vomiting, unable to keep fluids down develops, go to ER.  Blood pressure was significantly elevated initially today, however  after resting for ~10 minutes, the systolic blood pressure came down about 14 points.  Encouraged PCP follow up for ongoing elevated blood pressure.  He is asymptomatic from the elevated blood pressure.  Final Clinical Impressions(s) / UC Diagnoses   Final diagnoses:  Gross hematuria  Urinary frequency     Discharge Instructions      Please start the Bactrim for the possible UTI.  We will let you know if the urine culture results show we need to use a different antibiotic.  Please make sure you are drinking plenty of water and schedule an appointment with your PCP for the next 2 weeks.      ED Prescriptions     Medication Sig Dispense Auth. Provider   sulfamethoxazole-trimethoprim (BACTRIM) 400-80 MG tablet Take 1 tablet by mouth every 12 (twelve) hours for 14 days. 28 tablet Eulogio Bear, NP      PDMP not reviewed this encounter.   Eulogio Bear, NP 10/17/21 1259

## 2021-10-17 NOTE — Discharge Instructions (Addendum)
Please start the Bactrim for the possible UTI.  We will let you know if the urine culture results show we need to use a different antibiotic.  Please make sure you are drinking plenty of water and schedule an appointment with your PCP for the next 2 weeks.  ?

## 2021-10-17 NOTE — Progress Notes (Signed)
This patient returns to my office for at risk foot care.  This patient requires this care by a professional since this patient will be at risk due to having chronic kidney disease and coagulation defect.  Patient is taking eliquis.   This patient is unable to cut nails himself since the patient cannot reach his nails.These nails are painful walking and wearing shoes.  This patient presents for at risk foot care today.  General Appearance  Alert, conversant and in no acute stress.  Vascular  Dorsalis pedis and posterior tibial  pulses are weakly  palpable  bilaterally.  Capillary return is within normal limits  Bilaterally. Cold feet bilaterally. Absent digital hair  B/L.  Neurologic  Senn-Weinstein monofilament wire test within normal limits  bilaterally. Muscle power within normal limits bilaterally.  Nails Thick disfigured discolored nails with subungual debris  from hallux to fifth toes bilaterally. No evidence of bacterial infection or drainage bilaterally.  Orthopedic  No limitations of motion  feet .  No crepitus or effusions noted.  No bony pathology or digital deformities noted.  HAV  B/L.  Skin  normotropic skin with no porokeratosis noted bilaterally.  No signs of infections or ulcers noted.     Onychomycosis  Pain in right toes  Pain in left toes  Consent was obtained for treatment procedures.   Mechanical debridement of nails 1-5  bilaterally performed with a nail nipper.  Filed with dremel without incident.  Cauterized fourth toe left foot.   Return office visit     10  weeks                Told patient to return for periodic foot care and evaluation due to potential at risk complications.   Syliva Mee DPM  

## 2021-10-17 NOTE — Telephone Encounter (Signed)
?  Care Management  ? ?Follow Up Note ? ? ?10/17/2021 ?Name: Trevor Murphy MRN: [id number] DOB: 10/25/1934 ? ? ?Referred by: [name], FNP ?Reason for referral : Care Coordination ? ? ?In [place] call received from patients sister [name] to advise patient has blood in his urine and she is not sure who to call. Advised [name] to contact the patients primary care provider for assistance. ? ? ?Follow Up Plan: [name] to contact [facility] directly. SW will route note to patients provider [name] FNP. ? ?[name], BSW, CDP ?Social Worker, Certified Dementia Practitioner ?[facility] ?[phone] ? ?   ? ? ?

## 2021-10-17 NOTE — ED Triage Notes (Signed)
PT reports hematuria since Tuesday. Notes that he is urinating more frequently, denies dysuria, abdominal pain, genital pain, back pain. No new meds ?

## 2021-10-18 LAB — URINE CULTURE: Culture: <10000 — AB

## 2021-10-20 ENCOUNTER — Telehealth: Payer: Self-pay

## 2021-10-20 NOTE — Telephone Encounter (Signed)
Transition Care Management Follow-up Telephone Call ?Date of discharge and from where: 10/17/2021  ?How have you been since you were released from the hospital? Pt sister reports he is doing fine.  ?Any questions or concerns? No ? ?Items Reviewed: ?Did the pt receive and understand the discharge instructions provided? Yes  ?Medications obtained and verified? Yes  ?Other? Yes  ?Any new allergies since your discharge? No  ?Dietary orders reviewed? Yes ?Do you have support at home? Yes  ? ?Home Care and Equipment/Supplies: ?Were home health services ordered? no ?If so, what is the name of the agency? N/a  ?Has the agency set up a time to come to the patient's home? no ?Were any new equipment or medical supplies ordered?  No ?What is the name of the medical supply agency? N/a ?Were you able to get the supplies/equipment? no ?Do you have any questions related to the use of the equipment or supplies? No ? ?Functional Questionnaire: (I = Independent and D = Dependent) ?ADLs: I/d ? ?Bathing/Dressing- I/d ? ?Meal Prep- I/d ? ?Eating- I/d ? ?Maintaining continence- I/d ? ?Transferring/Ambulation- I/d ? ?Managing Meds- I/d ? ?Follow up appointments reviewed: ? ?PCP Hospital f/u appt confirmed? Yes  Scheduled to see [name] on 11/03/2021 @ [facility] ?[facility] f/u appt confirmed? No  Scheduled to see n/a on n/a @ n/a. ?Are transportation arrangements needed? No  ?If their condition worsens, is the pt aware to call PCP or go to the Emergency Dept.? Yes ?Was the patient provided with contact information for the PCP's office or ED? Yes ?Was to pt encouraged to call back with questions or concerns? Yes  ?

## 2021-10-27 NOTE — Progress Notes (Signed)
Carelink Summary Report / Loop Recorder 

## 2021-11-03 ENCOUNTER — Other Ambulatory Visit: Payer: Self-pay

## 2021-11-03 ENCOUNTER — Encounter: Payer: Self-pay | Admitting: Nurse Practitioner

## 2021-11-03 ENCOUNTER — Ambulatory Visit (INDEPENDENT_AMBULATORY_CARE_PROVIDER_SITE_OTHER): Payer: Medicare Other | Admitting: Nurse Practitioner

## 2021-11-03 VITALS — BP 120/64 | HR 66 | Temp 98.3°F | Ht 70.0 in | Wt 135.0 lb

## 2021-11-03 DIAGNOSIS — R3121 Asymptomatic microscopic hematuria: Secondary | ICD-10-CM

## 2021-11-03 DIAGNOSIS — E782 Mixed hyperlipidemia: Secondary | ICD-10-CM

## 2021-11-03 DIAGNOSIS — N390 Urinary tract infection, site not specified: Secondary | ICD-10-CM

## 2021-11-03 DIAGNOSIS — I10 Essential (primary) hypertension: Secondary | ICD-10-CM

## 2021-11-03 DIAGNOSIS — I129 Hypertensive chronic kidney disease with stage 1 through stage 4 chronic kidney disease, or unspecified chronic kidney disease: Secondary | ICD-10-CM

## 2021-11-03 DIAGNOSIS — N183 Chronic kidney disease, stage 3 unspecified: Secondary | ICD-10-CM | POA: Diagnosis not present

## 2021-11-03 DIAGNOSIS — R7303 Prediabetes: Secondary | ICD-10-CM

## 2021-11-03 DIAGNOSIS — R319 Hematuria, unspecified: Secondary | ICD-10-CM

## 2021-11-03 LAB — POCT URINALYSIS DIPSTICK
Bilirubin, UA: NEGATIVE
Glucose, UA: NEGATIVE
Ketones, UA: NEGATIVE
Leukocytes, UA: NEGATIVE
Nitrite, UA: NEGATIVE
Protein, UA: POSITIVE — AB
Spec Grav, UA: 1.015 (ref 1.010–1.025)
Urobilinogen, UA: 0.2 E.U./dL
pH, UA: 5.5 (ref 5.0–8.0)

## 2021-11-03 NOTE — Patient Instructions (Signed)
Urinary Tract Infection, Adult °A urinary tract infection (UTI) is an infection of any part of the urinary tract. The urinary tract includes: °The kidneys. °The ureters. °The bladder. °The urethra. °These organs make, store, and get rid of pee (urine) in the body. °What are the causes? °This infection is caused by germs (bacteria) in your genital area. These germs grow and cause swelling (inflammation) of your urinary tract. °What increases the risk? °The following factors may make you more likely to develop this condition: °Using a small, thin tube (catheter) to drain pee. °Not being able to control when you pee or poop (incontinence). °Being male. If you are male, these things can increase the risk: °Using these methods to prevent pregnancy: °A medicine that kills sperm (spermicide). °A device that blocks sperm (diaphragm). °Having low levels of a male hormone (estrogen). °Being pregnant. °You are more likely to develop this condition if: °You have genes that add to your risk. °You are sexually active. °You take antibiotic medicines. °You have trouble peeing because of: °A prostate that is bigger than normal, if you are male. °A blockage in the part of your body that drains pee from the bladder. °A kidney stone. °A nerve condition that affects your bladder. °Not getting enough to drink. °Not peeing often enough. °You have other conditions, such as: °Diabetes. °A weak disease-fighting system (immune system). °Sickle cell disease. °Gout. °Injury of the spine. °What are the signs or symptoms? °Symptoms of this condition include: °Needing to pee right away. °Peeing small amounts often. °Pain or burning when peeing. °Blood in the pee. °Pee that smells bad or not like normal. °Trouble peeing. °Pee that is cloudy. °Fluid coming from the vagina, if you are male. °Pain in the belly or lower back. °Other symptoms include: °Vomiting. °Not feeling hungry. °Feeling mixed up (confused). This may be the first symptom in  older adults. °Being tired and grouchy (irritable). °A fever. °Watery poop (diarrhea). °How is this treated? °Taking antibiotic medicine. °Taking other medicines. °Drinking enough water. °In some cases, you may need to see a specialist. °Follow these instructions at home: °Medicines °Take over-the-counter and prescription medicines only as told by your doctor. °If you were prescribed an antibiotic medicine, take it as told by your doctor. Do not stop taking it even if you start to feel better. °General instructions °Make sure you: °Pee until your bladder is empty. °Do not hold pee for a long time. °Empty your bladder after sex. °Wipe from front to back after peeing or pooping if you are a male. Use each tissue one time when you wipe. °Drink enough fluid to keep your pee pale yellow. °Keep all follow-up visits. °Contact a doctor if: °You do not get better after 1-2 days. °Your symptoms go away and then come back. °Get help right away if: °You have very bad back pain. °You have very bad pain in your lower belly. °You have a fever. °You have chills. °You feeling like you will vomit or you vomit. °Summary °A urinary tract infection (UTI) is an infection of any part of the urinary tract. °This condition is caused by germs in your genital area. °There are many risk factors for a UTI. °Treatment includes antibiotic medicines. °Drink enough fluid to keep your pee pale yellow. °This information is not intended to replace advice given to you by your health care provider. Make sure you discuss any questions you have with your health care provider. °Document Revised: 03/08/2020 Document Reviewed: 03/08/2020 °Elsevier Patient Education © 2022   [employer] ° °

## 2021-11-03 NOTE — Progress Notes (Signed)
?Industrial/product designer as a Education administrator for [employer], FNP.,have documented all relevant documentation on the behalf of [name], FNP,as directed by  [name], FNP while in the presence of [name], [place]. ? ?This visit occurred during the SARS-CoV-2 public health emergency.  Safety protocols were in place, including screening questions prior to the visit, additional usage of staff PPE, and extensive cleaning of exam room while observing appropriate contact time as indicated for disinfecting solutions. ? ?Subjective:  ?  ? Patient ID: Trevor Murphy , male    DOB: Jan 08, 1935 , 87 y.o.   MRN: [id number] ? ? ?Chief Complaint  ?Patient presents with  ?? Follow-up  ? ? ?HPI ? ?Patient is here for ED follow up for UTI.  He had large blood in urine and was diagnosed with a UTI. Took Bactrim for 12 days. No longer seeing blood in urine by the family.  ?  ? ?Past Medical History:  ?Diagnosis Date  ?? Arthritis   ?? CAD (coronary artery disease)   ? a. Cath 08/2009: minimal nonobstructive disease (25% LAD). b. Lexiscan nuc 6/215: EF 54%, no ischemia or infarction.  ?? Chronic diastolic CHF (congestive heart failure) ([place])   ? a. Echo 2013: EF 55-60%, mild AI, mild MR. b. Echo 5/15 with EF 60-65%, no WMA, mild LVH, mild AI.  ?? CKD (chronic kidney disease) stage 3, GFR 30-59 ml/min (HCC) 07/26/2012  ? a. Felt likely due to HTN.  ?? CVA (cerebral infarction) 07/28/2012  ?? Glaucoma   ?? Gout   ?? H/O umbilical hernia repair   ?? Headache(784.0)   ? " WHEN MY BLOOD PRESSURE IS UP"  ?? History of acute renal failure   ?? History of alcohol abuse   ?? HTN (hypertension)   ?? Hypertension   ? a. Patent renal arteries 2005.  ?? Noncompliance   ?? OA (osteoarthritis)   ?? Other and unspecified hyperlipidemia   ?? Poor circulation   ?? Sinus bradycardia   ?? SVT (supraventricular tachycardia) (HCC)   ? a. Possible AVNRT.  Got adenosine from EMS in 2015 for SVT.  During 5/15 hospitalization had episode of short R-P tachycardia  (possible AVNRT) terminated by adenosine.  b. 04/2016 - recurrent tachycardia (SVT vs. AVNRT), again terminated by Adenosine  ?? Visual impairment   ?  ? ?Family History  ?Problem Relation Age of Onset  ?? CVA Mother   ?? Hypertension Brother   ?? Hypertension Sister   ?? Hypertension Brother   ?? Stroke Sister   ?? Heart attack Neg Hx   ? ? ? ?Current Outpatient Medications:  ??  acetaminophen (TYLENOL) 650 MG CR tablet, Take 650 mg by mouth every 8 (eight) hours as needed for pain., Disp: , Rfl:  ??  apixaban (ELIQUIS) 2.5 MG TABS tablet, TAKE 1 TABLET(2.5 MG) BY MOUTH TWICE DAILY, Disp: 56 tablet, Rfl: 0 ??  atorvastatin (LIPITOR) 80 MG tablet, TAKE 1 TABLET(80 MG) BY MOUTH DAILY AT 6 PM, Disp: 90 tablet, Rfl: 1 ??  ferrous sulfate (FEROSUL) 325 (65 FE) MG tablet, TAKE 1 TABLET(325 MG) BY MOUTH DAILY, Disp: 90 tablet, Rfl: 1 ??  LUMIGAN 0.01 % SOLN, Place 1 drop into both eyes at bedtime., Disp: , Rfl:  ??  SIMBRINZA 1-0.2 % SUSP, Apply to eye., Disp: , Rfl:   ? ?No Known Allergies  ? ?Review of Systems  ?Constitutional: Negative.   ?Respiratory: Negative.    ?Cardiovascular: Negative.   ?Gastrointestinal: Negative.   ?Neurological: Negative.   ?  Psychiatric/Behavioral: Negative.     ? ?Today's Vitals  ? 11/03/21 1133  ?BP: 120/64  ?Pulse: 66  ?Temp: 98.3 ?F (36.8 ?C)  ?TempSrc: Oral  ?Weight: 135 lb (61.2 kg)  ?Height: _0  (1.778 m)  ? ?Body mass index is 19.37 kg/m?.  ? ?Objective:  ?Physical Exam ?Vitals reviewed.  ?Constitutional:   ?   General: He is not in acute distress. ?   Appearance: Normal appearance.  ?Cardiovascular:  ?   Rate and Rhythm: Normal rate and regular rhythm.  ?   Pulses: Normal pulses.  ?   Heart sounds: Normal heart sounds. No murmur heard. ?Pulmonary:  ?   Effort: Pulmonary effort is normal. No respiratory distress.  ?   Breath sounds: Normal breath sounds. No wheezing.  ?Abdominal:  ?   General: Abdomen is flat. Bowel sounds are normal. There is no distension.  ?   Palpations:  Abdomen is soft. There is no mass.  ?   Tenderness: There is no abdominal tenderness.  ?Neurological:  ?   General: No focal deficit present.  ?   Mental Status: He is alert and oriented to person, place, and time.  ?   Cranial Nerves: No cranial nerve deficit.  ?   Motor: No weakness.  ?Psychiatric:     ?   Mood and Affect: Mood normal.     ?   Behavior: Behavior normal.     ?   Thought Content: Thought content normal.     ?   Judgment: Judgment normal.  ?  ? ?   ?Assessment And Plan:  ?   ?1. Urinary tract infection with hematuria, site unspecified ?Comments: Seen at Urgent care for UTI and blood in urine, his culture had less than 10,000 colonies. Will send urine culture today to recheck, doing better. TCM Performed. A member of the clinical team spoke with the patient upon dischare. Discharge summary was reviewed in full detail during the visit. Meds reconciled and compared to discharge meds. Medication list is updated and reviewed with the patient.  Greater than 50% face to face time was spent in counseling an coordination of care.  All questions were answered to the satisfaction of the patient.   ?- POCT Urinalysis Dipstick (81002) ? ?2. Essential hypertension ?Comments: Blood pressure is well controlled, continue current medications ? ?3. Mixed hyperlipidemia ?Comments: Stable, continue statin tolerating well.  ? ?4. Stage 3 chronic kidney disease, unspecified whether stage 3a or 3b CKD ([place]) ?- BMP8+EGFR ? ?5. Prediabetes ?Comments: Stable, diet controlled.  ?- BMP8+EGFR ?- Hemoglobin A1c ?  ? ? ?Patient was given opportunity to ask questions. Patient verbalized understanding of the plan and was able to repeat key elements of the plan. All questions were answered to their satisfaction.  ?[name], FNP  ? ?I, [name], FNP, have reviewed all documentation for this visit. The documentation on 11/03/21 for the exam, diagnosis, procedures, and orders are all accurate and complete.  ? ?IF YOU HAVE BEEN  REFERRED TO A SPECIALIST, IT MAY TAKE 1-2 WEEKS TO SCHEDULE/PROCESS THE REFERRAL. IF YOU HAVE NOT HEARD FROM US/SPECIALIST IN TWO WEEKS, PLEASE GIVE [place] A CALL AT [phone].  ? ?THE PATIENT IS ENCOURAGED TO PRACTICE SOCIAL DISTANCING DUE TO THE COVID-19 PANDEMIC.   ?

## 2021-11-04 LAB — BMP8+EGFR
BUN/Creatinine Ratio: 9 — ABNORMAL LOW (ref 10–24)
BUN: 17 mg/dL (ref 8–27)
CO2: 17 mmol/L — ABNORMAL LOW (ref 20–29)
Calcium: 7.9 mg/dL — ABNORMAL LOW (ref 8.6–10.2)
Chloride: 107 mmol/L — ABNORMAL HIGH (ref 96–106)
Creatinine, Ser: 1.85 mg/dL — ABNORMAL HIGH (ref 0.76–1.27)
Glucose: 103 mg/dL — ABNORMAL HIGH (ref 70–99)
Potassium: 4.7 mmol/L (ref 3.5–5.2)
Sodium: 138 mmol/L (ref 134–144)
eGFR: 35 mL/min/{1.73_m2} — ABNORMAL LOW (ref >59)

## 2021-11-04 LAB — HEMOGLOBIN A1C
Est. average glucose Bld gHb Est-mCnc: 114 mg/dL
Hgb A1c MFr Bld: 5.6 % (ref 4.8–5.6)

## 2021-11-17 ENCOUNTER — Ambulatory Visit (INDEPENDENT_AMBULATORY_CARE_PROVIDER_SITE_OTHER): Payer: Medicare Other

## 2021-11-17 DIAGNOSIS — I639 Cerebral infarction, unspecified: Secondary | ICD-10-CM

## 2021-11-19 ENCOUNTER — Ambulatory Visit: Payer: Commercial Managed Care - HMO | Admitting: Nurse Practitioner

## 2021-11-19 LAB — CUP PACEART REMOTE DEVICE CHECK
Date Time Interrogation Session: 20230412073638
Implantable Pulse Generator Implant Date: 20201014

## 2021-12-04 ENCOUNTER — Other Ambulatory Visit: Payer: Self-pay | Admitting: Hematology and Oncology

## 2021-12-04 ENCOUNTER — Other Ambulatory Visit: Payer: Self-pay

## 2021-12-04 ENCOUNTER — Inpatient Hospital Stay (HOSPITAL_BASED_OUTPATIENT_CLINIC_OR_DEPARTMENT_OTHER): Payer: Medicare Other | Admitting: Hematology and Oncology

## 2021-12-04 ENCOUNTER — Inpatient Hospital Stay: Payer: Medicare Other | Attending: Hematology and Oncology

## 2021-12-04 VITALS — BP 182/72 | HR 52 | Resp 18 | Wt 136.4 lb

## 2021-12-04 DIAGNOSIS — D500 Iron deficiency anemia secondary to blood loss (chronic): Secondary | ICD-10-CM | POA: Diagnosis not present

## 2021-12-04 DIAGNOSIS — K922 Gastrointestinal hemorrhage, unspecified: Secondary | ICD-10-CM | POA: Diagnosis not present

## 2021-12-04 DIAGNOSIS — D708 Other neutropenia: Secondary | ICD-10-CM

## 2021-12-04 DIAGNOSIS — D696 Thrombocytopenia, unspecified: Secondary | ICD-10-CM

## 2021-12-04 DIAGNOSIS — Z8639 Personal history of other endocrine, nutritional and metabolic disease: Secondary | ICD-10-CM | POA: Diagnosis not present

## 2021-12-04 DIAGNOSIS — D72819 Decreased white blood cell count, unspecified: Secondary | ICD-10-CM | POA: Diagnosis not present

## 2021-12-04 LAB — CBC WITH DIFFERENTIAL (CANCER CENTER ONLY)
Abs Immature Granulocytes: 0.01 10*3/uL (ref 0.00–0.07)
Basophils Absolute: 0 10*3/uL (ref 0.0–0.1)
Basophils Relative: 1 %
Eosinophils Absolute: 0.1 10*3/uL (ref 0.0–0.5)
Eosinophils Relative: 7 %
HCT: 40.7 % (ref 39.0–52.0)
Hemoglobin: 12.9 g/dL — ABNORMAL LOW (ref 13.0–17.0)
Immature Granulocytes: 1 %
Lymphocytes Relative: 27 %
Lymphs Abs: 0.6 10*3/uL — ABNORMAL LOW (ref 0.7–4.0)
MCH: 27.9 pg (ref 26.0–34.0)
MCHC: 31.7 g/dL (ref 30.0–36.0)
MCV: 88.1 fL (ref 80.0–100.0)
Monocytes Absolute: 0.2 10*3/uL (ref 0.1–1.0)
Monocytes Relative: 12 %
Neutro Abs: 1.1 10*3/uL — ABNORMAL LOW (ref 1.7–7.7)
Neutrophils Relative %: 52 %
Platelet Count: 65 10*3/uL — ABNORMAL LOW (ref 150–400)
RBC: 4.62 MIL/uL (ref 4.22–5.81)
RDW: 17.3 % — ABNORMAL HIGH (ref 11.5–15.5)
WBC Count: 2.1 10*3/uL — ABNORMAL LOW (ref 4.0–10.5)
nRBC: 0 % (ref 0.0–0.2)

## 2021-12-04 LAB — FERRITIN: Ferritin: 72 ng/mL (ref 24–336)

## 2021-12-04 LAB — CMP (CANCER CENTER ONLY)
ALT: 25 U/L (ref 0–44)
AST: 28 U/L (ref 15–41)
Albumin: 3.5 g/dL (ref 3.5–5.0)
Alkaline Phosphatase: 111 U/L (ref 38–126)
Anion gap: 3 — ABNORMAL LOW (ref 5–15)
BUN: 18 mg/dL (ref 8–23)
CO2: 25 mmol/L (ref 22–32)
Calcium: 8.1 mg/dL — ABNORMAL LOW (ref 8.9–10.3)
Chloride: 111 mmol/L (ref 98–111)
Creatinine: 1.6 mg/dL — ABNORMAL HIGH (ref 0.61–1.24)
GFR, Estimated: 42 mL/min — ABNORMAL LOW (ref >60)
Glucose, Bld: 110 mg/dL — ABNORMAL HIGH (ref 70–99)
Potassium: 4.8 mmol/L (ref 3.5–5.1)
Sodium: 139 mmol/L (ref 135–145)
Total Bilirubin: 1.1 mg/dL (ref 0.3–1.2)
Total Protein: 7 g/dL (ref 6.5–8.1)

## 2021-12-04 LAB — RETIC PANEL
Immature Retic Fract: 10.6 % (ref 2.3–15.9)
RBC.: 4.63 MIL/uL (ref 4.22–5.81)
Retic Count, Absolute: 52.3 10*3/uL (ref 19.0–186.0)
Retic Ct Pct: 1.1 % (ref 0.4–3.1)
Reticulocyte Hemoglobin: 31.5 pg (ref >27.9)

## 2021-12-04 LAB — IRON AND IRON BINDING CAPACITY (CC-WL,HP ONLY)
Iron: 68 ug/dL (ref 45–182)
Saturation Ratios: 27 % (ref 17.9–39.5)
TIBC: 251 ug/dL (ref 250–450)
UIBC: 183 ug/dL (ref 117–376)

## 2021-12-04 NOTE — Progress Notes (Signed)
Carelink Summary Report / Loop Recorder 

## 2021-12-04 NOTE — Progress Notes (Signed)
?[place] ?[phone]   [phone] ? ?PROGRESS NOTE ? ?Patient Care Team: ?[name], FNP as PCP - General (General Practice) ?Little, [name], RN as [place] Management ? ?Hematological/Oncological History ?# Iron Deficiency Anemia in the Setting of GI Bleeding ?#Thrombocytopenia/Leukopenia ?08/12/2020: patient underwent an EGD which showed two non-bleeding cratered duodenal ulcers and multiple dispersed erosions with stigmata of recent bleeding. Hgb 7.0, MCV 79 ?08/23/2020: establish care with Dr. Lorenso Courier  ?10/18/2020: WBC 3.4, Hgb 11.5, MCV 81.6, Plt 127. Iron sat 49%, ferritin 44 ?01/20/2021: WBC 2.2, Hgb 11.7, MCV 82, Plt 78 ?03/03/2021: Bone marrow biopsy showed normocellular marrow with mild erythroid hyperplasia. Minor dysplasia noted.  ?12/04/2021: WBC 2.1, Hgb 12.9, MCV 88.1, Plt 65 ? ?Interval History:  ?Trevor Murphy 87 y.o. male with medical history significant for pancytopenia who presents for a follow up visit. The patient's last visit was on 06/05/2022. In the interim since the last visit he has had no major changes in his health. ? ?On exam today Trevor Murphy notes he has been "doing fine" in the interim since her last visit.  He reports everything is been "smooth".  He notes he has not had any hospitalizations though he did have to go to the urgent care due to blood in his urine.  He was noted to have a urinary tract at that time and his symptoms improved entirely with antibiotics.  He has had no further bleeding from his urine or from other sites.  He notes he is not having any issues with shortness of breath, lightheadedness, or dizziness.  He notes that he is currently taking iron pills every other day and tolerating it well without any stomach upset.  His hemoglobin is improved today and he is happy to hear this. He currently denies any shortness of breath or infectious symptoms.  He reports no fevers, chills, sweats, nausea, vomiting or  diarrhea.  A full 10 point ROS is listed below. ? ?MEDICAL HISTORY:  ?Past Medical History:  ?Diagnosis Date  ? Arthritis   ? CAD (coronary artery disease)   ? a. Cath 08/2009: minimal nonobstructive disease (25% LAD). b. Lexiscan nuc 6/215: EF 54%, no ischemia or infarction.  ? Chronic diastolic CHF (congestive heart failure) ([place])   ? a. Echo 2013: EF 55-60%, mild AI, mild MR. b. Echo 5/15 with EF 60-65%, no WMA, mild LVH, mild AI.  ? CKD (chronic kidney disease) stage 3, GFR 30-59 ml/min (HCC) 07/26/2012  ? a. Felt likely due to HTN.  ? CVA (cerebral infarction) 07/28/2012  ? Glaucoma   ? Gout   ? H/O umbilical hernia repair   ? Headache(784.0)   ? " WHEN MY BLOOD PRESSURE IS UP"  ? History of acute renal failure   ? History of alcohol abuse   ? HTN (hypertension)   ? Hypertension   ? a. Patent renal arteries 2005.  ? Noncompliance   ? OA (osteoarthritis)   ? Other and unspecified hyperlipidemia   ? Poor circulation   ? Sinus bradycardia   ? SVT (supraventricular tachycardia) ([place])   ? a. Possible AVNRT.  Got adenosine from EMS in 2015 for SVT.  During 5/15 hospitalization had episode of short R-P tachycardia (possible AVNRT) terminated by adenosine.  b. 04/2016 - recurrent tachycardia (SVT vs. AVNRT), again terminated by Adenosine  ? Visual impairment   ? ? ?SURGICAL HISTORY: ?Past Surgical History:  ?Procedure Laterality Date  ? APPENDECTOMY    ? BIOPSY  08/12/2020  ? Procedure: BIOPSY;  Surgeon: Juanita Craver, MD;  Location: [facility] ENDOSCOPY;  Service: Endoscopy;;  ? BUBBLE STUDY  05/24/2019  ? Procedure: BUBBLE STUDY;  Surgeon: Donato Heinz, MD;  Location: [facility] ENDOSCOPY;  Service: Endoscopy;;  ? CARDIAC CATHETERIZATION  11/2003  ? EF 50-55%  ? CARDIAC CATHETERIZATION  08/2009  ? CATARACT EXTRACTION EXTRACAPSULAR Right 05/17/2013  ? Procedure: CATARACT EXTRACTION EXTRACAPSULAR WITH INTRAOCULAR LENS PLACEMENT (IOC) RIGHT EYE;  Surgeon: Marylynn Pearson, MD;  Location: [place];  Service: Ophthalmology;  Laterality:  Right;  ? COLONOSCOPY    ? ESOPHAGOGASTRODUODENOSCOPY (EGD) WITH PROPOFOL N/A 08/12/2020  ? Procedure: ESOPHAGOGASTRODUODENOSCOPY (EGD) WITH PROPOFOL;  Surgeon: Juanita Craver, MD;  Location[facility] END[facility]COPY;  Service: Endoscopy;  Laterality: N/A;  ? HERNIA REPAIR    ? LOOP RECORDER INSERTION N/A 05/24/2019  ? Procedure: LOOP RECORDER INSERTION;  Surgeon: Constance Haw, MD;  Location: [place] CV LAB;  Service: Cardiovascular;  Laterality: N/A;  ? TEE WITHOUT CARDIOVERSION N/A 05/24/2019  ? Procedure: TRANSESOPHAGEAL ECHOCARDIOGRAM (TEE);  Surgeon: Donato Heinz, MD;  Location: [facility] ENDOSCOPY;  Service: Endoscopy;  Laterality: N/A;  ? VENTRAL HERNIA REPAIR  03/07/2012  ? Procedure: LAPAROSCOPIC VENTRAL HERNIA;  Surgeon: Gwenyth Ober, MD;  Location: [place];  Service: General;  Laterality: N/A;  ? ? ?SOCIAL HISTORY: ?Social History  ? ?Socioeconomic History  ? Marital status: Married  ?  Spouse name: Not on file  ? Number of children: Not on file  ? Years of education: Not on file  ? Highest education level: Not on file  ?Occupational History  ? Occupation: retired  ?Tobacco Use  ? Smoking status: Never  ? Smokeless tobacco: Never  ?Vaping Use  ? Vaping Use: Never used  ?Substance and Sexual Activity  ? Alcohol use: No  ?  Alcohol/week: 1.0 standard drink  ?  Types: 1 Cans of beer per week  ?  Comment: h/o heavy use, quit in 2017  ? Drug use: No  ? Sexual activity: Not Currently  ?Other Topics Concern  ? Not on file  ?Social History Narrative  ? Not on file  ? ?Social Determinants of Health  ? ?Financial Resource Strain: Low Risk   ? Difficulty of Paying Living Expenses: Not hard at all  ?Food Insecurity: No Food Insecurity  ? Worried About Charity fundraiser in the Last Year: Never true  ? Ran Out of Food in the Last Year: Never true  ?Transportation Needs: No Transportation Needs  ? Lack of Transportation (Medical): No  ? Lack of Transportation (Non-Medical): No  ?Physical Activity: Inactive  ? Days of  Exercise per Week: 0 days  ? Minutes of Exercise per Session: 0 min  ?Stress: No Stress Concern Present  ? Feeling of Stress : Not at all  ?Social Connections: Moderately Isolated  ? Frequency of Communication with Friends and Family: More than three times a week  ? Frequency of Social Gatherings with Friends and Family: More than three times a week  ? Attends Religious Services: More than 4 times per year  ? Active Member of Clubs or Organizations: No  ? Attends Archivist Meetings: Never  ? Marital Status: Widowed  ?Intimate Partner Violence: Not on file  ? ? ?FAMILY HISTORY: ?Family History  ?Problem Relation Age of Onset  ? CVA Mother   ? Hypertension Brother   ? Hypertension Sister   ? Hypertension Brother   ? Stroke Sister   ? Heart attack Neg  Hx   ? ? ?ALLERGIES:  has No Known Allergies. ? ?MEDICATIONS:  ?Current Outpatient Medications  ?Medication Sig Dispense Refill  ? acetaminophen (TYLENOL) 650 MG CR tablet Take 650 mg by mouth every 8 (eight) hours as needed for pain.    ? apixaban (ELIQUIS) 2.5 MG TABS tablet TAKE 1 TABLET(2.5 MG) BY MOUTH TWICE DAILY 56 tablet 0  ? atorvastatin (LIPITOR) 80 MG tablet TAKE 1 TABLET(80 MG) BY MOUTH DAILY AT 6 PM 90 tablet 1  ? ferrous sulfate (FEROSUL) 325 (65 FE) MG tablet TAKE 1 TABLET(325 MG) BY MOUTH DAILY 90 tablet 1  ? LUMIGAN 0.01 % SOLN Place 1 drop into both eyes at bedtime.    ? SIMBRINZA 1-0.2 % SUSP Apply to eye.    ? ?No current facility-administered medications for this visit.  ? ? ?REVIEW OF SYSTEMS:   ?Constitutional: ( - ) fevers, ( - )  chills , ( - ) night sweats ?Eyes: ( - ) blurriness of vision, ( - ) double vision, ( - ) watery eyes ?Ears, nose, mouth, throat, and face: ( - ) mucositis, ( - ) sore throat ?Respiratory: ( - ) cough, ( - ) dyspnea, ( - ) wheezes ?Cardiovascular: ( - ) palpitation, ( - ) chest discomfort, ( - ) lower extremity swelling ?Gastrointestinal:  ( - ) nausea, ( - ) heartburn, ( - ) change in bowel habits ?Skin: ( - )  abnormal skin rashes ?Lymphatics: ( - ) new lymphadenopathy, ( - ) easy bruising ?Neurological: ( - ) numbness, ( - ) tingling, ( - ) new weaknesses ?Behavioral/Psych: ( - ) mood change, ( - ) new changes  ?A

## 2021-12-08 ENCOUNTER — Telehealth: Payer: Self-pay | Admitting: Hematology and Oncology

## 2021-12-08 NOTE — Telephone Encounter (Signed)
Scheduled follow-up appointment per 4/27 los. Patient's sister is aware. ?

## 2021-12-22 ENCOUNTER — Ambulatory Visit (INDEPENDENT_AMBULATORY_CARE_PROVIDER_SITE_OTHER): Payer: Medicare Other

## 2021-12-22 DIAGNOSIS — I639 Cerebral infarction, unspecified: Secondary | ICD-10-CM

## 2021-12-23 ENCOUNTER — Ambulatory Visit: Payer: Medicare Other | Admitting: Podiatry

## 2021-12-24 LAB — CUP PACEART REMOTE DEVICE CHECK
Date Time Interrogation Session: 20230517110647
Implantable Pulse Generator Implant Date: 20201014

## 2022-01-09 ENCOUNTER — Other Ambulatory Visit: Payer: Self-pay

## 2022-01-09 ENCOUNTER — Ambulatory Visit (INDEPENDENT_AMBULATORY_CARE_PROVIDER_SITE_OTHER): Payer: Medicare Other | Admitting: Podiatry

## 2022-01-09 ENCOUNTER — Encounter: Payer: Self-pay | Admitting: Podiatry

## 2022-01-09 DIAGNOSIS — M79674 Pain in right toe(s): Secondary | ICD-10-CM | POA: Diagnosis not present

## 2022-01-09 DIAGNOSIS — I739 Peripheral vascular disease, unspecified: Secondary | ICD-10-CM | POA: Diagnosis not present

## 2022-01-09 DIAGNOSIS — M79675 Pain in left toe(s): Secondary | ICD-10-CM

## 2022-01-09 DIAGNOSIS — N183 Chronic kidney disease, stage 3 unspecified: Secondary | ICD-10-CM | POA: Diagnosis not present

## 2022-01-09 DIAGNOSIS — B351 Tinea unguium: Secondary | ICD-10-CM | POA: Diagnosis not present

## 2022-01-09 DIAGNOSIS — D509 Iron deficiency anemia, unspecified: Secondary | ICD-10-CM | POA: Insufficient documentation

## 2022-01-09 DIAGNOSIS — K269 Duodenal ulcer, unspecified as acute or chronic, without hemorrhage or perforation: Secondary | ICD-10-CM | POA: Insufficient documentation

## 2022-01-09 MED ORDER — ATORVASTATIN CALCIUM 80 MG PO TABS: ORAL_TABLET | ORAL | 1 refills | Status: DC

## 2022-01-09 NOTE — Progress Notes (Signed)
This patient returns to my office for at risk foot care.  This patient requires this care by a professional since this patient will be at risk due to having chronic kidney disease and coagulation defect.  Patient is taking eliquis.   This patient is unable to cut nails himself since the patient cannot reach his nails.These nails are painful walking and wearing shoes.  This patient presents for at risk foot care today.  General Appearance  Alert, conversant and in no acute stress.  Vascular  Dorsalis pedis and posterior tibial  pulses are weakly  palpable  bilaterally.  Capillary return is within normal limits  Bilaterally. Cold feet bilaterally. Absent digital hair  B/L.  Neurologic  Senn-Weinstein monofilament wire test within normal limits  bilaterally. Muscle power within normal limits bilaterally.  Nails Thick disfigured discolored nails with subungual debris  from hallux to fifth toes bilaterally. No evidence of bacterial infection or drainage bilaterally.  Orthopedic  No limitations of motion  feet .  No crepitus or effusions noted.  No bony pathology or digital deformities noted.  HAV  B/L.  Skin  normotropic skin with no porokeratosis noted bilaterally.  No signs of infections or ulcers noted.     Onychomycosis  Pain in right toes  Pain in left toes  Consent was obtained for treatment procedures.   Mechanical debridement of nails 1-5  bilaterally performed with a nail nipper.  Filed with dremel without incident.  Cauterized fourth toe left foot.   Return office visit     10  weeks                Told patient to return for periodic foot care and evaluation due to potential at risk complications.   Anisah Kuck DPM  

## 2022-01-12 NOTE — Progress Notes (Signed)
Carelink Summary Report / Loop Recorder 

## 2022-01-13 ENCOUNTER — Ambulatory Visit (INDEPENDENT_AMBULATORY_CARE_PROVIDER_SITE_OTHER): Payer: Medicare Other | Admitting: Nurse Practitioner

## 2022-01-13 ENCOUNTER — Encounter: Payer: Self-pay | Admitting: Nurse Practitioner

## 2022-01-13 VITALS — BP 136/88 | HR 90 | Temp 98.4°F | Ht 70.0 in | Wt 134.4 lb

## 2022-01-13 DIAGNOSIS — Z87440 Personal history of urinary (tract) infections: Secondary | ICD-10-CM

## 2022-01-13 DIAGNOSIS — R3121 Asymptomatic microscopic hematuria: Secondary | ICD-10-CM | POA: Diagnosis not present

## 2022-01-13 LAB — POCT URINALYSIS DIPSTICK
Bilirubin, UA: NEGATIVE
Glucose, UA: NEGATIVE
Ketones, UA: NEGATIVE
Leukocytes, UA: NEGATIVE
Nitrite, UA: NEGATIVE
Protein, UA: POSITIVE — AB
Spec Grav, UA: 1.015 (ref 1.010–1.025)
Urobilinogen, UA: 1 E.U./dL
pH, UA: 7 (ref 5.0–8.0)

## 2022-01-13 MED ORDER — SULFAMETHOXAZOLE-TRIMETHOPRIM 400-80 MG PO TABS: 1.0000 | ORAL_TABLET | Freq: Two times a day (BID) | ORAL | 0 refills | Status: AC

## 2022-01-13 NOTE — Patient Instructions (Signed)

## 2022-01-13 NOTE — Progress Notes (Signed)
I,Victoria T Hamilton,acting as a Education administrator for [name], FNP.,have documented all relevant documentation on the behalf of [name], FNP,as directed by  [name], FNP while in the presence of [name], [place].   This visit occurred during the SARS-CoV-2 public health emergency.  Safety protocols were in place, including screening questions prior to the visit, additional usage of staff PPE, and extensive cleaning of exam room while observing appropriate contact time as indicated for disinfecting solutions.  Subjective:     Patient ID: Trevor Murphy , male    DOB: [birth year] , 87 y.o.   MRN: [id number]   Chief Complaint  Patient presents with   Hematuria    HPI  Pt presents for blood in urine, he is here today with his sister Jule. She took him to go to urgent care today for UTI, mr Privett did not want to wait 2 hours so they came here for this appt today. He has blood in his urine for the last few weeks. He has been drinking adequate amounts of water and has frequent urination. Denies pain. His sister reports he has had blood in his bed and down the side of the commode.      Past Medical History:  Diagnosis Date   Arthritis    CAD (coronary artery disease)    a. Cath 08/2009: minimal nonobstructive disease (25% LAD). b. Lexiscan nuc 6/215: EF 54%, no ischemia or infarction.   Chronic diastolic CHF (congestive heart failure) ([place])    a. Echo 2013: EF 55-60%, mild AI, mild MR. b. Echo 5/15 with EF 60-65%, no WMA, mild LVH, mild AI.   CKD (chronic kidney disease) stage 3, GFR 30-59 ml/min (HCC) 07/26/2012   a. Felt likely due to HTN.   CVA (cerebral infarction) 07/28/2012   Glaucoma    Gout    H/O umbilical hernia repair    Headache(784.0)    " WHEN MY BLOOD PRESSURE IS UP"   History of acute renal failure    History of alcohol abuse    HTN (hypertension)    Hypertension    a. Patent renal arteries 2005.   Noncompliance    OA (osteoarthritis)    Other and unspecified  hyperlipidemia    Poor circulation    Sinus bradycardia    SVT (supraventricular tachycardia) ([place])    a. Possible AVNRT.  Got adenosine from EMS in 2015 for SVT.  During 5/15 hospitalization had episode of short R-P tachycardia (possible AVNRT) terminated by adenosine.  b. 04/2016 - recurrent tachycardia (SVT vs. AVNRT), again terminated by Adenosine   Visual impairment      Family History  Problem Relation Age of Onset   CVA Mother    Hypertension Brother    Hypertension Sister    Hypertension Brother    Stroke Sister    Heart attack Neg Hx      Current Outpatient Medications:    acetaminophen (TYLENOL) 650 MG CR tablet, Take 650 mg by mouth every 8 (eight) hours as needed for pain., Disp: , Rfl:    apixaban (ELIQUIS) 2.5 MG TABS tablet, TAKE 1 TABLET(2.5 MG) BY MOUTH TWICE DAILY, Disp: 56 tablet, Rfl: 0   atorvastatin (LIPITOR) 80 MG tablet, TAKE 1 TABLET(80 MG) BY MOUTH DAILY AT 6 PM, Disp: 90 tablet, Rfl: 1   ferrous sulfate (FEROSUL) 325 (65 FE) MG tablet, TAKE 1 TABLET(325 MG) BY MOUTH DAILY, Disp: 90 tablet, Rfl: 1   LUMIGAN 0.01 % SOLN, Place 1 drop into both  eyes at bedtime., Disp: , Rfl:    SIMBRINZA 1-0.2 % SUSP, Apply to eye., Disp: , Rfl:    sulfamethoxazole-trimethoprim (BACTRIM) 400-80 MG tablet, Take 1 tablet by mouth 2 (two) times daily for 10 days., Disp: 20 tablet, Rfl: 0   No Known Allergies   Review of Systems  Constitutional: Negative.   HENT: Negative.    Eyes: Negative.   Cardiovascular: Negative.   Gastrointestinal: Negative.   Genitourinary: Negative.   Musculoskeletal: Negative.   Skin: Negative.   Allergic/Immunologic: Negative.   Neurological: Negative.   Hematological: Negative.     Today's Vitals   01/13/22 1439  BP: 136/88  Pulse: 90  Temp: 98.4 F (36.9 C)  SpO2: 98%  Weight: 134 lb 6.4 oz (61 kg)  Height: '5\' 10"'$  (1.778 m)  PainSc: 0-No pain   Body mass index is 19.28 kg/m.  Wt Readings from Last 3 Encounters:  01/13/22 134 lb  6.4 oz (61 kg)  12/04/21 136 lb 6.4 oz (61.9 kg)  11/03/21 135 lb (61.2 kg)    Objective:  Physical Exam Vitals reviewed.  Constitutional:      General: He is not in acute distress.    Appearance: Normal appearance.  Cardiovascular:     Rate and Rhythm: Normal rate and regular rhythm.     Pulses: Normal pulses.     Heart sounds: Normal heart sounds. No murmur heard. Pulmonary:     Effort: Pulmonary effort is normal. No respiratory distress.     Breath sounds: Normal breath sounds. No wheezing.  Abdominal:     General: Abdomen is flat. Bowel sounds are normal. There is distension (soft).     Palpations: Abdomen is soft. There is no mass.     Tenderness: There is no abdominal tenderness.  Skin:    General: Skin is warm and dry.  Neurological:     General: No focal deficit present.     Mental Status: He is alert and oriented to person, place, and time.     Cranial Nerves: No cranial nerve deficit.     Motor: No weakness.  Psychiatric:        Mood and Affect: Mood normal.        Behavior: Behavior normal.        Thought Content: Thought content normal.        Judgment: Judgment normal.        Assessment And Plan:     1. Asymptomatic microscopic hematuria Comments: Large blood and 100 + protein. I will send for cx but I do not feel this is an infection but will treat to be sure. I will check a renal ultrasound and refer to Urology this is the second time he has had last visit at ER urine culture is negative for bacteria.  - POCT Urinalysis Dipstick (61950) - US Renal; Future - Ambulatory referral to Urology - sulfamethoxazole-trimethoprim (BACTRIM) 400-80 MG tablet; Take 1 tablet by mouth 2 (two) times daily for 10 days.  Dispense: 20 tablet; Refill: 0 - CBC - Urine Culture  2. History of UTI Given antibiotic as precaution.  - sulfamethoxazole-trimethoprim (BACTRIM) 400-80 MG tablet; Take 1 tablet by mouth 2 (two) times daily for 10 days.  Dispense: 20 tablet; Refill: 0 -  Urine Culture    Patient was given opportunity to ask questions. Patient verbalized understanding of the plan and was able to repeat key elements of the plan. All questions were answered to their satisfaction.  [name], FNP  Teola Bradley, FNP, have reviewed all documentation for this visit. The documentation on 01/13/22 for the exam, diagnosis, procedures, and orders are all accurate and complete.   IF YOU HAVE BEEN REFERRED TO A SPECIALIST, IT MAY TAKE 1-2 WEEKS TO SCHEDULE/PROCESS THE REFERRAL. IF YOU HAVE NOT HEARD FROM US/SPECIALIST IN TWO WEEKS, PLEASE GIVE [place] A CALL AT [phone].   THE PATIENT IS ENCOURAGED TO PRACTICE SOCIAL DISTANCING DUE TO THE COVID-19 PANDEMIC.

## 2022-01-14 LAB — CBC
Hematocrit: 39.5 % (ref 37.5–51.0)
Hemoglobin: 12.4 g/dL — ABNORMAL LOW (ref 13.0–17.7)
MCH: 27.9 pg (ref 26.6–33.0)
MCHC: 31.4 g/dL — ABNORMAL LOW (ref 31.5–35.7)
MCV: 89 fL (ref 79–97)
Platelets: 94 10*3/uL — CL (ref 150–450)
RBC: 4.44 x10E6/uL (ref 4.14–5.80)
RDW: 16 % — ABNORMAL HIGH (ref 11.6–15.4)
WBC: 2.7 10*3/uL — ABNORMAL LOW (ref 3.4–10.8)

## 2022-01-16 LAB — URINE CULTURE

## 2022-01-21 ENCOUNTER — Ambulatory Visit
Admission: RE | Admit: 2022-01-21 | Discharge: 2022-01-21 | Disposition: A | Discharge status: Home / Self Care | Payer: Medicare Other | Source: Ambulatory Visit | Attending: Nurse Practitioner | Admitting: Nurse Practitioner

## 2022-01-21 DIAGNOSIS — R3121 Asymptomatic microscopic hematuria: Secondary | ICD-10-CM

## 2022-01-21 DIAGNOSIS — N281 Cyst of kidney, acquired: Secondary | ICD-10-CM | POA: Diagnosis not present

## 2022-01-21 DIAGNOSIS — R319 Hematuria, unspecified: Secondary | ICD-10-CM | POA: Diagnosis not present

## 2022-01-25 ENCOUNTER — Other Ambulatory Visit: Payer: Self-pay | Admitting: Nurse Practitioner

## 2022-01-25 DIAGNOSIS — N390 Urinary tract infection, site not specified: Secondary | ICD-10-CM

## 2022-01-25 MED ORDER — NITROFURANTOIN MONOHYD MACRO 100 MG PO CAPS: 100.0000 mg | ORAL_CAPSULE | Freq: Two times a day (BID) | ORAL | 0 refills | Status: AC

## 2022-01-26 ENCOUNTER — Ambulatory Visit (INDEPENDENT_AMBULATORY_CARE_PROVIDER_SITE_OTHER): Payer: Medicare Other

## 2022-01-26 DIAGNOSIS — I639 Cerebral infarction, unspecified: Secondary | ICD-10-CM

## 2022-01-28 LAB — CUP PACEART REMOTE DEVICE CHECK
Date Time Interrogation Session: 20230621150502
Implantable Pulse Generator Implant Date: 20201014

## 2022-01-29 ENCOUNTER — Telehealth: Payer: Self-pay

## 2022-01-29 NOTE — Telephone Encounter (Signed)
Spoke to patients sister about brady events noted on ILR. She reported that patient has experienced bleeding from his urethera recently. States patient saw PCP, was told he had clots in his urine and has a f/u apt with Dr. Roni Bread ~ urologist.  Durward Fortes I would forward to let Ricky know.

## 2022-02-03 ENCOUNTER — Ambulatory Visit: Payer: Commercial Managed Care - HMO | Admitting: Nurse Practitioner

## 2022-02-09 ENCOUNTER — Telehealth: Payer: Self-pay

## 2022-02-09 DIAGNOSIS — I480 Paroxysmal atrial fibrillation: Secondary | ICD-10-CM

## 2022-02-09 NOTE — Telephone Encounter (Signed)
LVM for patient or patients care-giver to call device clinic back, ? Eliquis, last filled by Adline Peals on 08/08/21 with 56 tabs no refills.   LINQ alert received. 3 AF episodes detected on 02/07/22, longest 1 hr. 6 min. Total time ~ 3 hrs per [facility]. Average ventricular rate 100-143 bpm. Sent to Triage, unable to verify [place] status.  Follow up as scheduled.

## 2022-02-12 DIAGNOSIS — R310 Gross hematuria: Secondary | ICD-10-CM | POA: Diagnosis not present

## 2022-02-12 MED ORDER — APIXABAN 2.5 MG PO TABS: ORAL_TABLET | ORAL | 5 refills | Status: DC

## 2022-02-12 NOTE — Telephone Encounter (Signed)
Spoke with patients caregiver, she stated that patient is still taking medications has been filled by PCP but she stated that the pharmacy called about possibly having trouble refilling Rx

## 2022-02-12 NOTE — Telephone Encounter (Addendum)
Eliquis 2.'5mg'$  refill request received. Last eliquis encounter was samples per documentation. Patient is 87 years old, weight-61kg, Crea-1.60 on 12/04/2021, Diagnosis-Afib, and last seen by Malka So on 07/21/2021. Dose is appropriate based on dosing criteria. Will send in refill to requested pharmacy.

## 2022-02-12 NOTE — Addendum Note (Signed)
Addended by: Derrel Nip B on: 02/12/2022 11:03 AM   Modules accepted: Orders

## 2022-02-17 NOTE — Progress Notes (Signed)
Carelink Summary Report / Loop Recorder 

## 2022-02-23 DIAGNOSIS — R319 Hematuria, unspecified: Secondary | ICD-10-CM | POA: Diagnosis not present

## 2022-02-23 DIAGNOSIS — R310 Gross hematuria: Secondary | ICD-10-CM | POA: Diagnosis not present

## 2022-03-02 ENCOUNTER — Ambulatory Visit (INDEPENDENT_AMBULATORY_CARE_PROVIDER_SITE_OTHER): Payer: Medicare Other

## 2022-03-02 DIAGNOSIS — I480 Paroxysmal atrial fibrillation: Secondary | ICD-10-CM

## 2022-03-04 LAB — CUP PACEART REMOTE DEVICE CHECK
Date Time Interrogation Session: 20230725160040
Implantable Pulse Generator Implant Date: 20201014

## 2022-03-09 DIAGNOSIS — R310 Gross hematuria: Secondary | ICD-10-CM | POA: Diagnosis not present

## 2022-04-01 NOTE — Progress Notes (Signed)
This encounter was created in error - please disregard.

## 2022-04-06 ENCOUNTER — Ambulatory Visit (INDEPENDENT_AMBULATORY_CARE_PROVIDER_SITE_OTHER): Payer: Medicare Other

## 2022-04-06 DIAGNOSIS — I639 Cerebral infarction, unspecified: Secondary | ICD-10-CM | POA: Diagnosis not present

## 2022-04-07 ENCOUNTER — Ambulatory Visit (INDEPENDENT_AMBULATORY_CARE_PROVIDER_SITE_OTHER): Payer: Medicare Other | Admitting: Podiatry

## 2022-04-07 ENCOUNTER — Encounter: Payer: Self-pay | Admitting: Podiatry

## 2022-04-07 DIAGNOSIS — M79675 Pain in left toe(s): Secondary | ICD-10-CM

## 2022-04-07 DIAGNOSIS — B351 Tinea unguium: Secondary | ICD-10-CM | POA: Diagnosis not present

## 2022-04-07 DIAGNOSIS — N183 Chronic kidney disease, stage 3 unspecified: Secondary | ICD-10-CM

## 2022-04-07 DIAGNOSIS — I739 Peripheral vascular disease, unspecified: Secondary | ICD-10-CM

## 2022-04-07 DIAGNOSIS — M79674 Pain in right toe(s): Secondary | ICD-10-CM | POA: Diagnosis not present

## 2022-04-07 LAB — CUP PACEART REMOTE DEVICE CHECK
Date Time Interrogation Session: 20230819230754
Implantable Pulse Generator Implant Date: 20201014

## 2022-04-07 NOTE — Progress Notes (Signed)
This patient returns to my office for at risk foot care.  This patient requires this care by a professional since this patient will be at risk due to having chronic kidney disease and coagulation defect.  Patient is taking eliquis.   This patient is unable to cut nails himself since the patient cannot reach his nails.These nails are painful walking and wearing shoes.  This patient presents for at risk foot care today.  General Appearance  Alert, conversant and in no acute stress.  Vascular  Dorsalis pedis and posterior tibial  pulses are weakly  palpable  bilaterally.  Capillary return is within normal limits  Bilaterally. Cold feet bilaterally. Absent digital hair  B/L.  Neurologic  Senn-Weinstein monofilament wire test within normal limits  bilaterally. Muscle power within normal limits bilaterally.  Nails Thick disfigured discolored nails with subungual debris  from hallux to fifth toes bilaterally. No evidence of bacterial infection or drainage bilaterally.  Orthopedic  No limitations of motion  feet .  No crepitus or effusions noted.  No bony pathology or digital deformities noted.  HAV  B/L.  Skin  normotropic skin with no porokeratosis noted bilaterally.  No signs of infections or ulcers noted.     Onychomycosis  Pain in right toes  Pain in left toes  Consent was obtained for treatment procedures.   Mechanical debridement of nails 1-5  bilaterally performed with a nail nipper.  Filed with dremel without incident.  Cauterized fourth toe left foot.   Return office visit     10  weeks                Told patient to return for periodic foot care and evaluation due to potential at risk complications.   Shailah Gibbins DPM  

## 2022-04-10 NOTE — Progress Notes (Signed)
Carelink Summary Report / Loop Recorder 

## 2022-04-19 NOTE — Progress Notes (Signed)
I,Tianna Badgett,acting as a Education administrator for [employer], FNP.,have documented all relevant documentation on the behalf of [name], FNP,as directed by  [name], FNP while in the presence of [name], [place].  Subjective:     Patient ID: Trevor Murphy , male    DOB: 07-02-1935 , 87 y.o.   MRN: [id number]   Chief Complaint  Patient presents with   Annual Exam    HPI  Patient presents today for his HM. He is accompanied by his Sister.  He has seen Urology and had cystoscopy which was unremarkable, the hematuria was related to his prostate. His sister has not seen the blood around the house as much.        Past Medical History:  Diagnosis Date   Arthritis    CAD (coronary artery disease)    a. Cath 08/2009: minimal nonobstructive disease (25% LAD). b. Lexiscan nuc 6/215: EF 54%, no ischemia or infarction.   Chronic diastolic CHF (congestive heart failure) ([place])    a. Echo 2013: EF 55-60%, mild AI, mild MR. b. Echo 5/15 with EF 60-65%, no WMA, mild LVH, mild AI.   CKD (chronic kidney disease) stage 3, GFR 30-59 ml/min (HCC) 07/26/2012   a. Felt likely due to HTN.   CVA (cerebral infarction) 07/28/2012   Glaucoma    Gout    H/O umbilical hernia repair    Headache(784.0)    " WHEN MY BLOOD PRESSURE IS UP"   History of acute renal failure    History of alcohol abuse    HTN (hypertension)    Hypertension    a. Patent renal arteries 2005.   Noncompliance    OA (osteoarthritis)    Other and unspecified hyperlipidemia    Poor circulation    Sinus bradycardia    SVT (supraventricular tachycardia) ([place])    a. Possible AVNRT.  Got adenosine from EMS in 2015 for SVT.  During 5/15 hospitalization had episode of short R-P tachycardia (possible AVNRT) terminated by adenosine.  b. 04/2016 - recurrent tachycardia (SVT vs. AVNRT), again terminated by Adenosine   Visual impairment      Family History  Problem Relation Age of Onset   CVA Mother    Hypertension Brother     Hypertension Sister    Hypertension Brother    Stroke Sister    Heart attack Neg Hx      Current Outpatient Medications:    acetaminophen (TYLENOL) 650 MG CR tablet, Take 650 mg by mouth every 8 (eight) hours as needed for pain., Disp: , Rfl:    apixaban (ELIQUIS) 2.5 MG TABS tablet, TAKE 1 TABLET(2.5 MG) BY MOUTH TWICE DAILY, Disp: 60 tablet, Rfl: 5   atorvastatin (LIPITOR) 80 MG tablet, TAKE 1 TABLET(80 MG) BY MOUTH DAILY AT 6 PM, Disp: 90 tablet, Rfl: 1   ferrous sulfate (FEROSUL) 325 (65 FE) MG tablet, TAKE 1 TABLET(325 MG) BY MOUTH DAILY, Disp: 90 tablet, Rfl: 1   finasteride (PROSCAR) 5 MG tablet, Take 5 mg by mouth daily., Disp: , Rfl:    LUMIGAN 0.01 % SOLN, Place 1 drop into both eyes at bedtime., Disp: , Rfl:    SIMBRINZA 1-0.2 % SUSP, Apply to eye., Disp: , Rfl:    No Known Allergies   Men's preventive visit. Patient Health Questionnaire (PHQ-2) is  [place] Office Visit from 04/20/2022 in [facility]  PHQ-2 Total Score 0      Patient is on a Regular diet. Exercising - he has been  moving more and sitting on the porch.  Marital status: Married. Relevant history for alcohol use is:  Social History   Substance and Sexual Activity  Alcohol Use No   Alcohol/week: 1.0 standard drink of alcohol   Types: 1 Cans of beer per week   Comment: h/o heavy use, quit in 2017   Relevant history for tobacco use is:  Social History   Tobacco Use  Smoking Status Never  Smokeless Tobacco Never  .   Review of Systems  Constitutional: Negative.   HENT: Negative.    Eyes:  Positive for visual disturbance (only able to see shadows).  Respiratory: Negative.    Cardiovascular: Negative.   Gastrointestinal: Negative.   Endocrine: Negative.   Genitourinary: Negative.   Musculoskeletal: Negative.   Skin: Negative.   Allergic/Immunologic: Negative.   Neurological: Negative.   Hematological: Negative.   Psychiatric/Behavioral: Negative.       Today's  Vitals   04/20/22 1014  BP: 138/70  Pulse: 70  Temp: 97.7 F (36.5 C)  TempSrc: Oral  Weight: 145 lb (65.8 kg)  Height: _0  (1.778 m)   Body mass index is 20.81 kg/m.  Wt Readings from Last 3 Encounters:  04/20/22 145 lb (65.8 kg)  01/13/22 134 lb 6.4 oz (61 kg)  12/04/21 136 lb 6.4 oz (61.9 kg)    Objective:  Physical Exam Vitals reviewed.  Constitutional:      General: He is not in acute distress.    Appearance: Normal appearance. He is obese.  Cardiovascular:     Rate and Rhythm: Normal rate and regular rhythm.     Pulses: Normal pulses.     Heart sounds: Normal heart sounds. No murmur heard. Skin:    General: Skin is warm and dry.     Coloration: Skin is not jaundiced.     Findings: No bruising.  Neurological:     Mental Status: He is alert.         Assessment And Plan:    1. Encounter for annual physical exam Behavior modifications discussed and diet history reviewed.   Pt will continue to exercise regularly and modify diet with low GI, plant based foods and decrease intake of processed foods.  Recommend intake of daily multivitamin, Vitamin D, and calcium.  Recommend for preventive screenings, as well as recommend immunizations that include influenza, TDAP, and Shingles (declined)  2. Influenza vaccination declined Patient declined influenza vaccination at this time. Patient is aware that influenza vaccine prevents illness in 70% of healthy people, and reduces hospitalizations to 30-70% in elderly. This vaccine is recommended annually. Education has been provided regarding the importance of this vaccine but patient still declined. Advised may receive this vaccine at local pharmacy or Health Dept.or vaccine clinic. Aware to provide a copy of the vaccination record if obtained from local pharmacy or Health Dept.  Pt is willing to accept risk associated with refusing vaccination.  3. Herpes zoster vaccination declined Declines shingrix, educated on disease  process and is aware if he changes his mind to notify office  4. Other long term (current) drug therapy - CBC  5. Hypertensive heart disease with chronic combined systolic and diastolic congestive heart failure ([place]) Comments: Continue current medications and f/u with cardiology. Sinus Bradycardia -First degree A-V block.  Right bundle branch block.   - POCT Urinalysis Dipstick (81002) - Microalbumin / Creatinine Urine Ratio - EKG 12-Lead  6. Mixed hyperlipidemia Comments: Stable. Continue statin, tolerating well.  - CMP14+EGFR - Lipid panel  7. Prediabetes Comments: No current medications, encouraged to avoid eating junk food.  - Hemoglobin A1c  8. Paroxysmal atrial fibrillation (HCC) Comments: Continue Eliquis, tolerating well.   9. Presence of automatic (implantable) cardiac defibrillator  10. Gallbladder polyp Comments: Repeat ultrasound due to having a polyp last year to check for change in size or any growth - US Abdomen Limited RUQ (LIVER/GB); Future   Patient was given opportunity to ask questions. Patient verbalized understanding of the plan and was able to repeat key elements of the plan. All questions were answered to their satisfaction.   [name], FNP   I, [name], FNP, have reviewed all documentation for this visit. The documentation on 04/20/22 for the exam, diagnosis, procedures, and orders are all accurate and complete.   THE PATIENT IS ENCOURAGED TO PRACTICE SOCIAL DISTANCING DUE TO THE COVID-19 PANDEMIC.

## 2022-04-20 ENCOUNTER — Ambulatory Visit (INDEPENDENT_AMBULATORY_CARE_PROVIDER_SITE_OTHER): Payer: Medicare Other | Admitting: Nurse Practitioner

## 2022-04-20 ENCOUNTER — Encounter: Payer: Self-pay | Admitting: Nurse Practitioner

## 2022-04-20 VITALS — BP 138/70 | HR 70 | Temp 97.7°F | Ht 70.0 in | Wt 145.0 lb

## 2022-04-20 DIAGNOSIS — Z2821 Immunization not carried out because of patient refusal: Secondary | ICD-10-CM

## 2022-04-20 DIAGNOSIS — Z0000 Encounter for general adult medical examination without abnormal findings: Secondary | ICD-10-CM | POA: Diagnosis not present

## 2022-04-20 DIAGNOSIS — I10 Essential (primary) hypertension: Secondary | ICD-10-CM

## 2022-04-20 DIAGNOSIS — Z95810 Presence of automatic (implantable) cardiac defibrillator: Secondary | ICD-10-CM | POA: Diagnosis not present

## 2022-04-20 DIAGNOSIS — I5032 Chronic diastolic (congestive) heart failure: Secondary | ICD-10-CM

## 2022-04-20 DIAGNOSIS — I480 Paroxysmal atrial fibrillation: Secondary | ICD-10-CM | POA: Diagnosis not present

## 2022-04-20 DIAGNOSIS — E782 Mixed hyperlipidemia: Secondary | ICD-10-CM | POA: Diagnosis not present

## 2022-04-20 DIAGNOSIS — K824 Cholesterolosis of gallbladder: Secondary | ICD-10-CM | POA: Diagnosis not present

## 2022-04-20 DIAGNOSIS — I5042 Chronic combined systolic (congestive) and diastolic (congestive) heart failure: Secondary | ICD-10-CM | POA: Diagnosis not present

## 2022-04-20 DIAGNOSIS — R7303 Prediabetes: Secondary | ICD-10-CM

## 2022-04-20 DIAGNOSIS — I110 Hypertensive heart disease with heart failure: Secondary | ICD-10-CM | POA: Diagnosis not present

## 2022-04-20 DIAGNOSIS — Z79899 Other long term (current) drug therapy: Secondary | ICD-10-CM | POA: Diagnosis not present

## 2022-04-20 LAB — POCT URINALYSIS DIPSTICK
Bilirubin, UA: NEGATIVE
Glucose, UA: NEGATIVE
Ketones, UA: NEGATIVE
Leukocytes, UA: NEGATIVE
Nitrite, UA: NEGATIVE
Protein, UA: POSITIVE — AB
Spec Grav, UA: 1.025 (ref 1.010–1.025)
Urobilinogen, UA: >=8 E.U./dL — AB
pH, UA: 7 (ref 5.0–8.0)

## 2022-04-20 NOTE — Patient Instructions (Addendum)
Health Maintenance, Male Adopting a healthy lifestyle and getting preventive care are important in promoting health and wellness. Ask your health care provider about: The right schedule for you to have regular tests and exams. Things you can do on your own to prevent diseases and keep yourself healthy. What should I know about diet, weight, and exercise? Eat a healthy diet  Eat a diet that includes plenty of vegetables, fruits, low-fat dairy products, and lean protein. Do not eat a lot of foods that are high in solid fats, added sugars, or sodium. Maintain a healthy weight Body mass index (BMI) is a measurement that can be used to identify possible weight problems. It estimates body fat based on height and weight. Your health care provider can help determine your BMI and help you achieve or maintain a healthy weight. Get regular exercise Get regular exercise. This is one of the most important things you can do for your health. Most adults should: Exercise for at least 150 minutes each week. The exercise should increase your heart rate and make you sweat (moderate-intensity exercise). Do strengthening exercises at least twice a week. This is in addition to the moderate-intensity exercise. Spend less time sitting. Even light physical activity can be beneficial. Watch cholesterol and blood lipids Have your blood tested for lipids and cholesterol at 85 years of age, then have this test every 5 years. You may need to have your cholesterol levels checked more often if: Your lipid or cholesterol levels are high. You are older than 85 years of age. You are at high risk for heart disease. What should I know about cancer screening? Many types of cancers can be detected early and may often be prevented. Depending on your health history and family history, you may need to have cancer screening at various ages. This may include screening for: Colorectal cancer. Prostate cancer. Skin cancer. Lung  cancer. What should I know about heart disease, diabetes, and high blood pressure? Blood pressure and heart disease High blood pressure causes heart disease and increases the risk of stroke. This is more likely to develop in people who have high blood pressure readings or are overweight. Talk with your health care provider about your target blood pressure readings. Have your blood pressure checked: Every 3-5 years if you are 18-39 years of age. Every year if you are 40 years old or older. If you are between the ages of 65 and 75 and are a current or former smoker, ask your health care provider if you should have a one-time screening for abdominal aortic aneurysm (AAA). Diabetes Have regular diabetes screenings. This checks your fasting blood sugar level. Have the screening done: Once every three years after age 45 if you are at a normal weight and have a low risk for diabetes. More often and at a younger age if you are overweight or have a high risk for diabetes. What should I know about preventing infection? Hepatitis B If you have a higher risk for hepatitis B, you should be screened for this virus. Talk with your health care provider to find out if you are at risk for hepatitis B infection. Hepatitis C Blood testing is recommended for: Everyone born from 1945 through 1965. Anyone with known risk factors for hepatitis C. Sexually transmitted infections (STIs) You should be screened each year for STIs, including gonorrhea and chlamydia, if: You are sexually active and are younger than 85 years of age. You are older than 85 years of age and your   health care provider tells you that you are at risk for this type of infection. Your sexual activity has changed since you were last screened, and you are at increased risk for chlamydia or gonorrhea. Ask your health care provider if you are at risk. Ask your health care provider about whether you are at high risk for HIV. Your health care provider  may recommend a prescription medicine to help prevent HIV infection. If you choose to take medicine to prevent HIV, you should first get tested for HIV. You should then be tested every 3 months for as long as you are taking the medicine. Follow these instructions at home: Alcohol use Do not drink alcohol if your health care provider tells you not to drink. If you drink alcohol: Limit how much you have to 0-2 drinks a day. Know how much alcohol is in your drink. In the [place] one drink equals one 12 oz bottle of beer (355 mL), one 5 oz glass of wine (148 mL), or one 1 oz glass of hard liquor (44 mL). Lifestyle Do not use any products that contain nicotine or tobacco. These products include cigarettes, chewing tobacco, and vaping devices, such as e-cigarettes. If you need help quitting, ask your health care provider. Do not use street drugs. Do not share needles. Ask your health care provider for help if you need support or information about quitting drugs. General instructions Schedule regular health, dental, and eye exams. Stay current with your vaccines. Tell your health care provider if: You often feel depressed. You have ever been abused or do not feel safe at home. Summary Adopting a healthy lifestyle and getting preventive care are important in promoting health and wellness. Follow your health care provider's instructions about healthy diet, exercising, and getting tested or screened for diseases. Follow your health care provider's instructions on monitoring your cholesterol and blood pressure. This information is not intended to replace advice given to you by your health care provider. Make sure you discuss any questions you have with your health care provider. Document Revised: 12/16/2020 Document Reviewed: 12/16/2020 Elsevier Patient Education  [employer].  Afib clinic Address: [address] [place], [place], [place] [address] Phone: [phone]

## 2022-04-21 LAB — CMP14+EGFR
ALT: 17 IU/L (ref 0–44)
AST: 28 IU/L (ref 0–40)
Albumin/Globulin Ratio: 0.9 — ABNORMAL LOW (ref 1.2–2.2)
Albumin: 3 g/dL — ABNORMAL LOW (ref 3.7–4.7)
Alkaline Phosphatase: 119 IU/L (ref 44–121)
BUN/Creatinine Ratio: 8 — ABNORMAL LOW (ref 10–24)
BUN: 12 mg/dL (ref 8–27)
Bilirubin Total: 1.4 mg/dL — ABNORMAL HIGH (ref 0.0–1.2)
CO2: 21 mmol/L (ref 20–29)
Calcium: 7.4 mg/dL — ABNORMAL LOW (ref 8.6–10.2)
Chloride: 108 mmol/L — ABNORMAL HIGH (ref 96–106)
Creatinine, Ser: 1.55 mg/dL — ABNORMAL HIGH (ref 0.76–1.27)
Globulin, Total: 3.5 g/dL (ref 1.5–4.5)
Glucose: 91 mg/dL (ref 70–99)
Potassium: 4.8 mmol/L (ref 3.5–5.2)
Sodium: 139 mmol/L (ref 134–144)
Total Protein: 6.5 g/dL (ref 6.0–8.5)
eGFR: 44 mL/min/{1.73_m2} — ABNORMAL LOW (ref >59)

## 2022-04-21 LAB — CBC
Hematocrit: 36.2 % — ABNORMAL LOW (ref 37.5–51.0)
Hemoglobin: 11.7 g/dL — ABNORMAL LOW (ref 13.0–17.7)
MCH: 28.7 pg (ref 26.6–33.0)
MCHC: 32.3 g/dL (ref 31.5–35.7)
MCV: 89 fL (ref 79–97)
Platelets: 67 10*3/uL — CL (ref 150–450)
RBC: 4.08 x10E6/uL — ABNORMAL LOW (ref 4.14–5.80)
RDW: 16.2 % — ABNORMAL HIGH (ref 11.6–15.4)
WBC: 2.9 10*3/uL — ABNORMAL LOW (ref 3.4–10.8)

## 2022-04-21 LAB — MICROALBUMIN / CREATININE URINE RATIO
Creatinine, Urine: 137.6 mg/dL
Microalb/Creat Ratio: 95 mg/g creat — ABNORMAL HIGH (ref 0–29)
Microalbumin, Urine: 130.4 ug/mL

## 2022-04-21 LAB — HEMOGLOBIN A1C
Est. average glucose Bld gHb Est-mCnc: 108 mg/dL
Hgb A1c MFr Bld: 5.4 % (ref 4.8–5.6)

## 2022-04-21 LAB — LIPID PANEL
Chol/HDL Ratio: 1.6 ratio (ref 0.0–5.0)
Cholesterol, Total: 73 mg/dL — ABNORMAL LOW (ref 100–199)
HDL: 47 mg/dL (ref >39)
LDL Chol Calc (NIH): 13 mg/dL (ref 0–99)
Triglycerides: 47 mg/dL (ref 0–149)
VLDL Cholesterol Cal: 13 mg/dL (ref 5–40)

## 2022-04-22 ENCOUNTER — Ambulatory Visit: Payer: Self-pay

## 2022-04-22 NOTE — Patient Instructions (Signed)
Visit Information  Thank you for taking time to visit with me today. Please don't hesitate to contact me if I can be of assistance to you.   Following are the goals we discussed today:   Goals Addressed             This Visit's Progress    I need to schedule a follow up for his A-fib       Care Coordination Interventions: Counseled on increased risk of stroke due to Afib and benefits of anticoagulation for stroke prevention Reviewed importance of adherence to anticoagulant exactly as prescribed Advised patient to discuss reduced dosing of Eliquis from sister due to hematuria with provider Counseled on bleeding risk associated with Eliquis and importance of self-monitoring for signs/symptoms of bleeding Counseled on seeking medical attention after a head injury or if there is blood in the urine/stool       Our next appointment is by telephone on 06/22/22 at 0900 AM   Please call the care guide team at [phone] if you need to cancel or reschedule your appointment.   If you are experiencing a Mental Health or [place] or need someone to talk to, please call 1-800-273-TALK (toll free, 24 hour hotline)  Patient verbalizes understanding of instructions and care plan provided today and agrees to view in [place]. Active MyChart status and patient understanding of how to access instructions and care plan via MyChart confirmed with patient.     Barb Merino, RN, BSN, CCM Care Management Coordinator [facility] Care Management Direct Phone: [phone]

## 2022-04-22 NOTE — Patient Outreach (Signed)
  Care Coordination   Follow Up Visit Note   04/22/2022 Name: DELMAR ARRIAGA MRN: [id number] DOB: 08/15/1934  ELDRED SOOY is a 87 y.o. year old male who sees [name], [place] for primary care. I spoke with sister Debbe Bales by phone today.  What matters to the patients health and wellness today?  Patient's sister will call Cardiology to schedule patient's follow up with the Afib clinic.     Goals Addressed             This Visit's Progress    I need to schedule a follow up for his A-fib       Care Coordination Interventions: Counseled on increased risk of stroke due to Afib and benefits of anticoagulation for stroke prevention Reviewed importance of adherence to anticoagulant exactly as prescribed Advised patient to discuss reduced dosing of Eliquis from sister due to hematuria with provider Counseled on bleeding risk associated with Eliquis and importance of self-monitoring for signs/symptoms of bleeding Counseled on seeking medical attention after a head injury or if there is blood in the urine/stool       SDOH assessments and interventions completed:  No     Care Coordination Interventions Activated:  Yes  Care Coordination Interventions:  Yes, provided   Follow up plan: Follow up call scheduled for 06/22/22 '@09'$ :00 AM    Encounter Outcome:  Pt. Visit Completed

## 2022-04-27 ENCOUNTER — Other Ambulatory Visit (HOSPITAL_COMMUNITY): Payer: Self-pay | Admitting: *Deleted

## 2022-04-27 DIAGNOSIS — I480 Paroxysmal atrial fibrillation: Secondary | ICD-10-CM

## 2022-04-27 MED ORDER — APIXABAN 2.5 MG PO TABS: ORAL_TABLET | ORAL | 5 refills | Status: DC

## 2022-04-30 ENCOUNTER — Ambulatory Visit
Admission: RE | Admit: 2022-04-30 | Discharge: 2022-04-30 | Disposition: A | Discharge status: Home / Self Care | Payer: Medicare Other | Source: Ambulatory Visit | Attending: Nurse Practitioner | Admitting: Nurse Practitioner

## 2022-04-30 DIAGNOSIS — K7460 Unspecified cirrhosis of liver: Secondary | ICD-10-CM | POA: Diagnosis not present

## 2022-04-30 DIAGNOSIS — K824 Cholesterolosis of gallbladder: Secondary | ICD-10-CM

## 2022-04-30 DIAGNOSIS — R188 Other ascites: Secondary | ICD-10-CM | POA: Diagnosis not present

## 2022-04-30 NOTE — Progress Notes (Signed)
Carelink Summary Report / Loop Recorder 

## 2022-05-01 ENCOUNTER — Ambulatory Visit (INDEPENDENT_AMBULATORY_CARE_PROVIDER_SITE_OTHER): Payer: Medicare Other

## 2022-05-01 DIAGNOSIS — I639 Cerebral infarction, unspecified: Secondary | ICD-10-CM

## 2022-05-03 ENCOUNTER — Other Ambulatory Visit: Payer: Self-pay | Admitting: Nurse Practitioner

## 2022-05-04 LAB — CUP PACEART REMOTE DEVICE CHECK
Date Time Interrogation Session: 20230922100144
Implantable Pulse Generator Implant Date: 20201014

## 2022-05-08 NOTE — Progress Notes (Signed)
Carelink Summary Report / Loop Recorder 

## 2022-05-22 ENCOUNTER — Other Ambulatory Visit: Payer: Self-pay | Admitting: Nurse Practitioner

## 2022-05-22 DIAGNOSIS — R1900 Intra-abdominal and pelvic swelling, mass and lump, unspecified site: Secondary | ICD-10-CM

## 2022-05-27 ENCOUNTER — Other Ambulatory Visit: Payer: Self-pay | Admitting: Nurse Practitioner

## 2022-05-27 DIAGNOSIS — H548 Legal blindness, as defined in USA: Secondary | ICD-10-CM

## 2022-06-03 ENCOUNTER — Ambulatory Visit (INDEPENDENT_AMBULATORY_CARE_PROVIDER_SITE_OTHER): Payer: Medicare Other

## 2022-06-03 DIAGNOSIS — I639 Cerebral infarction, unspecified: Secondary | ICD-10-CM | POA: Diagnosis not present

## 2022-06-03 LAB — CUP PACEART REMOTE DEVICE CHECK
Date Time Interrogation Session: 20231024230108
Implantable Pulse Generator Implant Date: 20201014

## 2022-06-05 ENCOUNTER — Encounter (HOSPITAL_COMMUNITY): Payer: Self-pay

## 2022-06-05 ENCOUNTER — Other Ambulatory Visit: Payer: Self-pay | Admitting: Hematology and Oncology

## 2022-06-05 ENCOUNTER — Inpatient Hospital Stay (HOSPITAL_COMMUNITY)
Admission: EM | Admit: 2022-06-05 | Discharge: 2022-06-09 | DRG: 683 | Disposition: A | Discharge status: Home / Self Care | Payer: Medicare Other | Attending: Family Medicine | Admitting: Family Medicine

## 2022-06-05 ENCOUNTER — Telehealth: Payer: Self-pay | Admitting: *Deleted

## 2022-06-05 ENCOUNTER — Inpatient Hospital Stay: Payer: Medicare Other

## 2022-06-05 ENCOUNTER — Inpatient Hospital Stay: Payer: Medicare Other | Attending: Hematology and Oncology | Admitting: Hematology and Oncology

## 2022-06-05 VITALS — BP 153/63 | HR 95 | Temp 97.5°F | Resp 16 | Wt 139.6 lb

## 2022-06-05 DIAGNOSIS — D500 Iron deficiency anemia secondary to blood loss (chronic): Secondary | ICD-10-CM | POA: Diagnosis not present

## 2022-06-05 DIAGNOSIS — I480 Paroxysmal atrial fibrillation: Secondary | ICD-10-CM | POA: Diagnosis present

## 2022-06-05 DIAGNOSIS — D696 Thrombocytopenia, unspecified: Secondary | ICD-10-CM | POA: Diagnosis not present

## 2022-06-05 DIAGNOSIS — Z7901 Long term (current) use of anticoagulants: Secondary | ICD-10-CM | POA: Diagnosis not present

## 2022-06-05 DIAGNOSIS — I5032 Chronic diastolic (congestive) heart failure: Secondary | ICD-10-CM | POA: Diagnosis not present

## 2022-06-05 DIAGNOSIS — Z79899 Other long term (current) drug therapy: Secondary | ICD-10-CM | POA: Diagnosis not present

## 2022-06-05 DIAGNOSIS — N179 Acute kidney failure, unspecified: Secondary | ICD-10-CM | POA: Diagnosis not present

## 2022-06-05 DIAGNOSIS — Z8249 Family history of ischemic heart disease and other diseases of the circulatory system: Secondary | ICD-10-CM

## 2022-06-05 DIAGNOSIS — R310 Gross hematuria: Secondary | ICD-10-CM | POA: Diagnosis not present

## 2022-06-05 DIAGNOSIS — I2510 Atherosclerotic heart disease of native coronary artery without angina pectoris: Secondary | ICD-10-CM | POA: Diagnosis present

## 2022-06-05 DIAGNOSIS — Z8673 Personal history of transient ischemic attack (TIA), and cerebral infarction without residual deficits: Secondary | ICD-10-CM | POA: Diagnosis not present

## 2022-06-05 DIAGNOSIS — N183 Chronic kidney disease, stage 3 unspecified: Secondary | ICD-10-CM | POA: Diagnosis present

## 2022-06-05 DIAGNOSIS — M109 Gout, unspecified: Secondary | ICD-10-CM | POA: Diagnosis not present

## 2022-06-05 DIAGNOSIS — I10 Essential (primary) hypertension: Secondary | ICD-10-CM | POA: Diagnosis not present

## 2022-06-05 DIAGNOSIS — E785 Hyperlipidemia, unspecified: Secondary | ICD-10-CM | POA: Diagnosis present

## 2022-06-05 DIAGNOSIS — D509 Iron deficiency anemia, unspecified: Secondary | ICD-10-CM | POA: Diagnosis present

## 2022-06-05 DIAGNOSIS — Z9841 Cataract extraction status, right eye: Secondary | ICD-10-CM | POA: Diagnosis not present

## 2022-06-05 DIAGNOSIS — Z9049 Acquired absence of other specified parts of digestive tract: Secondary | ICD-10-CM

## 2022-06-05 DIAGNOSIS — T795XXD Traumatic anuria, subsequent encounter: Secondary | ICD-10-CM

## 2022-06-05 DIAGNOSIS — D469 Myelodysplastic syndrome, unspecified: Secondary | ICD-10-CM | POA: Diagnosis present

## 2022-06-05 DIAGNOSIS — I130 Hypertensive heart and chronic kidney disease with heart failure and stage 1 through stage 4 chronic kidney disease, or unspecified chronic kidney disease: Secondary | ICD-10-CM | POA: Diagnosis present

## 2022-06-05 DIAGNOSIS — H409 Unspecified glaucoma: Secondary | ICD-10-CM | POA: Diagnosis not present

## 2022-06-05 DIAGNOSIS — R531 Weakness: Secondary | ICD-10-CM | POA: Diagnosis present

## 2022-06-05 DIAGNOSIS — N1832 Chronic kidney disease, stage 3b: Secondary | ICD-10-CM | POA: Diagnosis not present

## 2022-06-05 DIAGNOSIS — K274 Chronic or unspecified peptic ulcer, site unspecified, with hemorrhage: Secondary | ICD-10-CM | POA: Diagnosis present

## 2022-06-05 LAB — CBC WITH DIFFERENTIAL (CANCER CENTER ONLY)
Abs Immature Granulocytes: 0 10*3/uL (ref 0.00–0.07)
Basophils Absolute: 0 10*3/uL (ref 0.0–0.1)
Basophils Relative: 0 %
Eosinophils Absolute: 0.1 10*3/uL (ref 0.0–0.5)
Eosinophils Relative: 4 %
HCT: 35.2 % — ABNORMAL LOW (ref 39.0–52.0)
Hemoglobin: 11.6 g/dL — ABNORMAL LOW (ref 13.0–17.0)
Immature Granulocytes: 0 %
Lymphocytes Relative: 18 %
Lymphs Abs: 0.5 10*3/uL — ABNORMAL LOW (ref 0.7–4.0)
MCH: 30.2 pg (ref 26.0–34.0)
MCHC: 33 g/dL (ref 30.0–36.0)
MCV: 91.7 fL (ref 80.0–100.0)
Monocytes Absolute: 0.3 10*3/uL (ref 0.1–1.0)
Monocytes Relative: 12 %
Neutro Abs: 1.8 10*3/uL (ref 1.7–7.7)
Neutrophils Relative %: 66 %
Platelet Count: 62 10*3/uL — ABNORMAL LOW (ref 150–400)
RBC: 3.84 MIL/uL — ABNORMAL LOW (ref 4.22–5.81)
RDW: 17 % — ABNORMAL HIGH (ref 11.5–15.5)
WBC Count: 2.8 10*3/uL — ABNORMAL LOW (ref 4.0–10.5)
nRBC: 0 % (ref 0.0–0.2)

## 2022-06-05 LAB — CBC WITH DIFFERENTIAL/PLATELET
Abs Immature Granulocytes: 0 10*3/uL (ref 0.00–0.07)
Basophils Absolute: 0 10*3/uL (ref 0.0–0.1)
Basophils Relative: 0 %
Eosinophils Absolute: 0.1 10*3/uL (ref 0.0–0.5)
Eosinophils Relative: 2 %
HCT: 38.4 % — ABNORMAL LOW (ref 39.0–52.0)
Hemoglobin: 12.2 g/dL — ABNORMAL LOW (ref 13.0–17.0)
Immature Granulocytes: 0 %
Lymphocytes Relative: 14 %
Lymphs Abs: 0.4 10*3/uL — ABNORMAL LOW (ref 0.7–4.0)
MCH: 29.8 pg (ref 26.0–34.0)
MCHC: 31.8 g/dL (ref 30.0–36.0)
MCV: 93.9 fL (ref 80.0–100.0)
Monocytes Absolute: 0.3 10*3/uL (ref 0.1–1.0)
Monocytes Relative: 9 %
Neutro Abs: 2.2 10*3/uL (ref 1.7–7.7)
Neutrophils Relative %: 75 %
Platelets: 63 10*3/uL — ABNORMAL LOW (ref 150–400)
RBC: 4.09 MIL/uL — ABNORMAL LOW (ref 4.22–5.81)
RDW: 17.2 % — ABNORMAL HIGH (ref 11.5–15.5)
WBC: 2.9 10*3/uL — ABNORMAL LOW (ref 4.0–10.5)
nRBC: 0 % (ref 0.0–0.2)

## 2022-06-05 LAB — FERRITIN: Ferritin: 135 ng/mL (ref 24–336)

## 2022-06-05 LAB — BASIC METABOLIC PANEL
Anion gap: 4 — ABNORMAL LOW (ref 5–15)
BUN: 36 mg/dL — ABNORMAL HIGH (ref 8–23)
CO2: 20 mmol/L — ABNORMAL LOW (ref 22–32)
Calcium: 7.7 mg/dL — ABNORMAL LOW (ref 8.9–10.3)
Chloride: 111 mmol/L (ref 98–111)
Creatinine, Ser: 3.12 mg/dL — ABNORMAL HIGH (ref 0.61–1.24)
GFR, Estimated: 19 mL/min — ABNORMAL LOW (ref >60)
Glucose, Bld: 110 mg/dL — ABNORMAL HIGH (ref 70–99)
Potassium: 4.8 mmol/L (ref 3.5–5.1)
Sodium: 135 mmol/L (ref 135–145)

## 2022-06-05 LAB — CMP (CANCER CENTER ONLY)
ALT: 21 U/L (ref 0–44)
AST: 28 U/L (ref 15–41)
Albumin: 2.7 g/dL — ABNORMAL LOW (ref 3.5–5.0)
Alkaline Phosphatase: 92 U/L (ref 38–126)
Anion gap: 5 (ref 5–15)
BUN: 32 mg/dL — ABNORMAL HIGH (ref 8–23)
CO2: 22 mmol/L (ref 22–32)
Calcium: 7.5 mg/dL — ABNORMAL LOW (ref 8.9–10.3)
Chloride: 109 mmol/L (ref 98–111)
Creatinine: 3.07 mg/dL (ref 0.61–1.24)
GFR, Estimated: 19 mL/min — ABNORMAL LOW (ref >60)
Glucose, Bld: 100 mg/dL — ABNORMAL HIGH (ref 70–99)
Potassium: 4.6 mmol/L (ref 3.5–5.1)
Sodium: 136 mmol/L (ref 135–145)
Total Bilirubin: 1.4 mg/dL — ABNORMAL HIGH (ref 0.3–1.2)
Total Protein: 6.1 g/dL — ABNORMAL LOW (ref 6.5–8.1)

## 2022-06-05 LAB — RETIC PANEL
Immature Retic Fract: 7.3 % (ref 2.3–15.9)
RBC.: 3.76 MIL/uL — ABNORMAL LOW (ref 4.22–5.81)
Retic Count, Absolute: 33.8 10*3/uL (ref 19.0–186.0)
Retic Ct Pct: 0.9 % (ref 0.4–3.1)
Reticulocyte Hemoglobin: 33.8 pg (ref >27.9)

## 2022-06-05 LAB — IRON AND IRON BINDING CAPACITY (CC-WL,HP ONLY)
Iron: 80 ug/dL (ref 45–182)
Saturation Ratios: 48 % — ABNORMAL HIGH (ref 17.9–39.5)
TIBC: 168 ug/dL — ABNORMAL LOW (ref 250–450)
UIBC: 88 ug/dL — ABNORMAL LOW (ref 117–376)

## 2022-06-05 MED ORDER — APIXABAN 2.5 MG PO TABS
2.5000 mg | ORAL_TABLET | Freq: Two times a day (BID) | ORAL | Status: DC
  Administered 2022-06-05 – 2022-06-09 (×8): 2.5 mg via ORAL
  Filled 2022-06-05 (×8): qty 1

## 2022-06-05 MED ORDER — ACETAMINOPHEN 650 MG RE SUPP: 650.0000 mg | Freq: Four times a day (QID) | RECTAL | Status: DC | PRN

## 2022-06-05 MED ORDER — ACETAMINOPHEN 325 MG PO TABS
650.0000 mg | ORAL_TABLET | Freq: Four times a day (QID) | ORAL | Status: DC | PRN
  Administered 2022-06-07 – 2022-06-08 (×2): 650 mg via ORAL
  Filled 2022-06-05 (×3): qty 2

## 2022-06-05 MED ORDER — SODIUM CHLORIDE 0.9 % IV SOLN: INTRAVENOUS | Status: AC

## 2022-06-05 MED ORDER — HYDRALAZINE HCL 20 MG/ML IJ SOLN: 10.0000 mg | Freq: Four times a day (QID) | INTRAMUSCULAR | Status: DC | PRN

## 2022-06-05 MED ORDER — FERROUS SULFATE 325 (65 FE) MG PO TABS
325.0000 mg | ORAL_TABLET | Freq: Every day | ORAL | Status: DC
  Administered 2022-06-06 – 2022-06-09 (×4): 325 mg via ORAL
  Filled 2022-06-05 (×4): qty 1

## 2022-06-05 MED ORDER — LORAZEPAM 1 MG PO TABS: 1.0000 mg | ORAL_TABLET | Freq: Once | ORAL | 0 refills | Status: AC

## 2022-06-05 MED ORDER — ALBUTEROL SULFATE (2.5 MG/3ML) 0.083% IN NEBU: 2.5000 mg | INHALATION_SOLUTION | RESPIRATORY_TRACT | Status: DC | PRN

## 2022-06-05 MED ORDER — SODIUM CHLORIDE 0.9 % IV SOLN: INTRAVENOUS | Status: DC

## 2022-06-05 NOTE — ED Triage Notes (Signed)
Pt arrived via POV, with family. Had blood work done today, called to come to ED due to elevated kidney function

## 2022-06-05 NOTE — Telephone Encounter (Signed)
TCT patient's sister [name].  Spoke with her. Advised thatwe received more lab results after they left here today. These labs indicate a serious problem with his kidneys. Advised that he needs to go to the emergency room here at [facility] immediately. Advised that his kidney function changed significantly in the last month. [name] was surprised but is very agreeable to taking him to the ED.  Dr. Lorenso Courier aware.

## 2022-06-05 NOTE — ED Provider Triage Note (Signed)
Emergency Medicine Provider Triage Evaluation Note  Trevor Murphy , a 87 y.o. male  was evaluated in triage.  Pt complains of elevated creatinine.  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  Presented today and had lab work done.  This did show elevated creatinine of 3.07 which is above his baseline of approximately 1.6.  Patient was called and informed to come to the ED immediately.  Currently denying all symptoms.  Specifically denies urinary symptoms, chest pain, shortness of breath  Review of Systems  Positive: As above Negative: As above  Physical Exam  BP (!) 158/69 (BP Location: Left Arm)   Pulse 62   Temp (!) 97.5 F (36.4 C) (Oral)   Resp 16   SpO2 100%  Gen:   Awake, no distress   Resp:  Normal effort  MSK:   Moves extremities without difficulty  Other:    Medical Decision Making  Medically screening exam initiated at 5:52 PM.  Appropriate orders placed.  [name] was informed that the remainder of the evaluation will be completed by another provider, this initial triage assessment does not replace that evaluation, and the importance of remaining in the ED until their evaluation is complete.     Roylene Reason, [place] 06/05/22 1754

## 2022-06-05 NOTE — H&P (Signed)
History and Physical    Trevor Murphy JHE:[id number] DOB: 1935/03/17 DOA: 06/05/2022  PCP: [name], FNP  Patient coming from: heme clinic  I have personally briefly reviewed patient's old medical records in [place]  Chief Complaint: abnormal renal function   HPI: Trevor Murphy is a 87 y.o. male with medical history significant of  IDA insetting of GI bleed due to PUD, thrombocytopenia, leukopenia,, myelodysplasia, CAD nonobstructive disease, CKDIIIa, Essential hypertension, CVA, Gout ,SVT who presents to ED in referral from heme clinic due to increase in CR from 1.55 to 3. Patient notes no n/v/d/abdominal pain, chest pain, sob , recent hospitalizations  or new medications.  He notes he feels very well. He denies any dark stools or bleeding.    ED Course:  Vitals: Afeb, bp 158, /69, hr 62, rr 16, sat 100%  Labs: A 135, K 4.8, co2 20,  gly 110, cr 3.12 ( 1.55 9/11) Wbc 2.9 at baseline, hgb 12.2 at baseline,  plt 63 at baseline  ED: tx NS 1L  Review of Systems: As per HPI otherwise 10 point review of systems negative.   Past Medical History:  Diagnosis Date   Arthritis    CAD (coronary artery disease)    a. Cath 08/2009: minimal nonobstructive disease (25% LAD). b. Lexiscan nuc 6/215: EF 54%, no ischemia or infarction.   Chronic diastolic CHF (congestive heart failure) ([place])    a. Echo 2013: EF 55-60%, mild AI, mild MR. b. Echo 5/15 with EF 60-65%, no WMA, mild LVH, mild AI.   CKD (chronic kidney disease) stage 3, GFR 30-59 ml/min (HCC) 07/26/2012   a. Felt likely due to HTN.   CVA (cerebral infarction) 07/28/2012   Glaucoma    Gout    H/O umbilical hernia repair    Headache(784.0)    " WHEN MY BLOOD PRESSURE IS UP"   History of acute renal failure    History of alcohol abuse    HTN (hypertension)    Hypertension    a. Patent renal arteries 2005.   Noncompliance    OA (osteoarthritis)    Other and unspecified hyperlipidemia    Poor circulation    Sinus  bradycardia    SVT (supraventricular tachycardia)    a. Possible AVNRT.  Got adenosine from EMS in 2015 for SVT.  During 5/15 hospitalization had episode of short R-P tachycardia (possible AVNRT) terminated by adenosine.  b. 04/2016 - recurrent tachycardia (SVT vs. AVNRT), again terminated by Adenosine   Visual impairment     Past Surgical History:  Procedure Laterality Date   APPENDECTOMY     BIOPSY  08/12/2020   Procedure: BIOPSY;  Surgeon: Juanita Craver, MD;  Location: [facility] ENDOSCOPY;  Service: Endoscopy;;   BUBBLE STUDY  05/24/2019   Procedure: BUBBLE STUDY;  Surgeon: Donato Heinz, MD;  Location: [facility] ENDOSCOPY;  Service: Endoscopy;;   CARDIAC CATHETERIZATION  11/2003   EF 50-55%   CARDIAC CATHETERIZATION  08/2009   CATARACT EXTRACTION EXTRACAPSULAR Right 05/17/2013   Procedure: CATARACT EXTRACTION EXTRACAPSULAR WITH INTRAOCULAR LENS PLACEMENT ([place]) RIGHT EYE;  Surgeon: Marylynn Pearson, MD;  Location: [place];  Service: Ophthalmology;  Laterality: Right;   COLONOSCOPY     ESOPHAGOGASTRODUODENOSCOPY (EGD) WITH PROPOFOL N/A 08/12/2020   Procedure: ESOPHAGOGASTRODUODENOSCOPY (EGD) WITH PROPOFOL;  Surgeon: Juanita Craver, MD;  Location: [facility] ENDOSCOPY;  Service: Endoscopy;  Laterality: N/A;   HERNIA REPAIR     LOOP RECORDER INSERTION N/A 05/24/2019   Procedure: LOOP RECORDER INSERTION;  Surgeon: Curt Bears,  Ocie Doyne, MD;  Location: [place] CV LAB;  Service: Cardiovascular;  Laterality: N/A;   TEE WITHOUT CARDIOVERSION N/A 05/24/2019   Procedure: TRANSESOPHAGEAL ECHOCARDIOGRAM (TEE);  Surgeon: Donato Heinz, MD;  Location: [facility] ENDOSCOPY;  Service: Endoscopy;  Laterality: N/A;   VENTRAL HERNIA REPAIR  03/07/2012   Procedure: LAPAROSCOPIC VENTRAL HERNIA;  Surgeon: Gwenyth Ober, MD;  Location: [place];  Service: General;  Laterality: N/A;     reports that he has never smoked. He has never used smokeless tobacco. He reports that he does not drink alcohol and does not use drugs.  No Known  Allergies  Family History  Problem Relation Age of Onset   CVA Mother    Hypertension Brother    Hypertension Sister    Hypertension Brother    Stroke Sister    Heart attack Neg Hx     Prior to Admission medications   Medication Sig Start Date End Date Taking? Authorizing Provider  acetaminophen (TYLENOL) 650 MG CR tablet Take 650 mg by mouth every 8 (eight) hours as needed for pain.    [provider]  apixaban (ELIQUIS) 2.5 MG TABS tablet TAKE 1 TABLET(2.5 MG) BY MOUTH TWICE DAILY 04/27/22   Fenton, Clint R, PA  atorvastatin (LIPITOR) 80 MG tablet TAKE 1 TABLET(80 MG) BY MOUTH DAILY AT 6 PM 01/09/22   [name], FNP  ferrous sulfate (FEROSUL) 325 (65 FE) MG tablet TAKE 1 TABLET(325 MG) BY MOUTH DAILY 10/13/21   [name], FNP  finasteride (PROSCAR) 5 MG tablet Take 5 mg by mouth daily. 03/09/22   [provider]  LORazepam (ATIVAN) 1 MG tablet Take 1 tablet (1 mg total) by mouth once for 1 dose. Administer 1 hour prior to MRI scan 06/05/22 06/05/22  Ledell Peoples IV, MD  LUMIGAN 0.01 % SOLN Place 1 drop into both eyes at bedtime. 08/28/19   [provider]  SIMBRINZA 1-0.2 % SUSP Apply to eye. 07/14/21   [provider]    Physical Exam: Vitals:   06/05/22 1724 06/05/22 2043  BP: (!) 158/69 (!) 180/67  Pulse: 62 (!) 58  Resp: 16 16  Temp: (!) 97.5 F (36.4 C) (!) 97.5 F (36.4 C)  TempSrc: Oral Oral  SpO2: 100% 97%    Constitutional: NAD, calm, comfortable Vitals:   06/05/22 1724 06/05/22 2043  BP: (!) 158/69 (!) 180/67  Pulse: 62 (!) 58  Resp: 16 16  Temp: (!) 97.5 F (36.4 C) (!) 97.5 F (36.4 C)  TempSrc: Oral Oral  SpO2: 100% 97%   Eyes: PERRL, lids and conjunctivae normal ENMT: Mucous membranes are moist. Posterior pharynx clear of any exudate or lesions.Normal dentition.  Neck: normal, supple, no masses, no thyromegaly Respiratory: clear to auscultation bilaterally, no wheezing, no crackles. Normal respiratory effort. No  accessory muscle use.  Cardiovascular: Regular rate and rhythm, no murmurs / rubs / gallops. No extremity edema. 2+ pedal pulses. No carotid bruits.  Abdomen: no tenderness, no masses palpated. No hepatosplenomegaly. Bowel sounds positive.  Musculoskeletal: no clubbing / cyanosis. No joint deformity upper and lower extremities. Good ROM, no contractures. Normal muscle tone.  Skin: no rashes, lesions, ulcers. No induration Neurologic: CN 2-12 grossly intact. Sensation intact, DTR normal. Strength 5/5 in all 4.  Psychiatric: Normal judgment and insight. Alert and oriented x 3. Normal mood.    Labs on Admission: I have personally reviewed following labs and imaging studies  CBC: Recent Labs  Lab 06/05/22 1505 06/05/22 2044  WBC  2.8* 2.9*  NEUTROABS 1.8 2.2  HGB 11.6* 12.2*  HCT 35.2* 38.4*  MCV 91.7 93.9  PLT 62* 63*   Basic Metabolic Panel: Recent Labs  Lab 06/05/22 1505 06/05/22 2044  NA 136 135  K 4.6 4.8  CL 109 111  CO2 22 20*  GLUCOSE 100* 110*  BUN 32* 36*  CREATININE 3.07* 3.12*  CALCIUM 7.5* 7.7*   GFR: Estimated Creatinine Clearance: 15.5 mL/min (A) (by C-G formula based on SCr of 3.12 mg/dL (H)). Liver Function Tests: Recent Labs  Lab 06/05/22 1505  AST 28  ALT 21  ALKPHOS 92  BILITOT 1.4*  PROT 6.1*  ALBUMIN 2.7*   No results for input(s): "LIPASE", "AMYLASE" in the last 168 hours. No results for input(s): "AMMONIA" in the last 168 hours. Coagulation Profile: No results for input(s): "INR", "PROTIME" in the last 168 hours. Cardiac Enzymes: No results for input(s): "CKTOTAL", "CKMB", "CKMBINDEX", "TROPONINI" in the last 168 hours. BNP (last 3 results) No results for input(s): "PROBNP" in the last 8760 hours. HbA1C: No results for input(s): "HGBA1C" in the last 72 hours. CBG: No results for input(s): "GLUCAP" in the last 168 hours. Lipid Profile: No results for input(s): "CHOL", "HDL", "LDLCALC", "TRIG", "CHOLHDL", "LDLDIRECT" in the last 72  hours. Thyroid Function Tests: No results for input(s): "TSH", "T4TOTAL", "FREET4", "T3FREE", "THYROIDAB" in the last 72 hours. Anemia Panel: Recent Labs    06/05/22 1505  FERRITIN 135  TIBC 168*  IRON 80  RETICCTPCT 0.9   Urine analysis:    Component Value Date/Time   COLORURINE STRAW (A) 05/20/2019 1040   APPEARANCEUR CLEAR 05/20/2019 1040   LABSPEC 1.015 10/17/2021 1222   PHURINE 5.0 10/17/2021 1222   GLUCOSEU NEGATIVE 10/17/2021 1222   HGBUR LARGE (A) 10/17/2021 1222   BILIRUBINUR Negative 04/20/2022 1137   KETONESUR 15 (A) 10/17/2021 1222   PROTEINUR Positive (A) 04/20/2022 1137   PROTEINUR >=300 (A) 10/17/2021 1222   UROBILINOGEN >=8.0 (A) 04/20/2022 1137   UROBILINOGEN 2.0 (H) 10/17/2021 1222   NITRITE Negative 04/20/2022 1137   NITRITE NEGATIVE 10/17/2021 1222   LEUKOCYTESUR Negative 04/20/2022 1137   LEUKOCYTESUR LARGE (A) 10/17/2021 1222    Radiological Exams on Admission: No results found.  EKG: Independently reviewed. pending  Assessment/Plan Acute renal failure on CKDIIIa -unclear cause /no new medications no gi losses - per renal rec -initiate hydration , they will consult in am  - will hold nephrotoxic medications  -continue ivfs  - strict I/o  -renal u/s  in am if no improvement with ivfs overnight    IDA  -insetting of GI bleed due to PUD -stable at baseline  -followed by heme   MDS Thrombocytopenia Leukopenia -stable at baseline  -followed by heme   CAD nonobstructive disease] -no active issues , resume home regimen    Essential hypertension -uncontrolled in ED --resume home regimen  -prn as needed    CVA -resume secondary ppx    Gout  -no active issues     DVT prophylaxis: scd Code Status: full Family Communication:  Disposition Plan:patient  expected to be admitted greater less than2 midnights  Consults called: nephrology  Admission status: med tele   Clance Boll MD [facility]   If 7PM-7AM, please  contact night-coverage [email or web address] Password [facility]  06/05/2022, 9:43 PM

## 2022-06-05 NOTE — ED Provider Notes (Signed)
[facility] [place] DEPT Provider Note   CSN: [id number] Arrival date & time: 06/05/22  1644     History  Chief Complaint  Patient presents with   Abnormal Labs    Trevor Murphy is a 87 y.o. male.  87 year old male presents due to abnormal labs.  Patient was seen by his hematologist today for 32-monthfollow-up after a blood transfusion.  He does have history of pancytopenia.  Lower today showed his creatinine of 3.  Creatinine about a month ago was 1.5.  Patient states he has not been short of breath.  Does endorse whole body weakness which is unchanged.  Patient had no significant new anemia compared to prior labs according to the old records today.  He denies any urinary retention symptoms.  No flank pain.  Was sent here by his doctor for further evaluation       Home Medications Prior to Admission medications   Medication Sig Start Date End Date Taking? Authorizing Provider  acetaminophen (TYLENOL) 650 MG CR tablet Take 650 mg by mouth every 8 (eight) hours as needed for pain.    [provider]  apixaban (ELIQUIS) 2.5 MG TABS tablet TAKE 1 TABLET(2.5 MG) BY MOUTH TWICE DAILY 04/27/22   Fenton, Clint R, PA  atorvastatin (LIPITOR) 80 MG tablet TAKE 1 TABLET(80 MG) BY MOUTH DAILY AT 6 PM 01/09/22   MMinette Brine FNP  ferrous sulfate (FEROSUL) 325 (65 FE) MG tablet TAKE 1 TABLET(325 MG) BY MOUTH DAILY 10/13/21   MMinette Brine FNP  finasteride (PROSCAR) 5 MG tablet Take 5 mg by mouth daily. 03/09/22   [provider]  LORazepam (ATIVAN) 1 MG tablet Take 1 tablet (1 mg total) by mouth once for 1 dose. Administer 1 hour prior to MRI scan 06/05/22 06/05/22  DLedell PeoplesIV, MD  LUMIGAN 0.01 % SOLN Place 1 drop into both eyes at bedtime. 08/28/19   [provider]  SIMBRINZA 1-0.2 % SUSP Apply to eye. 07/14/21   [provider]      Allergies    Patient has no known allergies.    Review of Systems   Review of Systems  All other  systems reviewed and are negative.   Physical Exam Updated Vital Signs BP (!) 180/67 (BP Location: Left Arm)   Pulse (!) 58   Temp (!) 97.5 F (36.4 C) (Oral)   Resp 16   SpO2 97%  Physical Exam Vitals and nursing note reviewed.  Constitutional:      General: He is not in acute distress.    Appearance: Normal appearance. He is well-developed. He is not toxic-appearing.  HENT:     Head: Normocephalic and atraumatic.  Eyes:     General: Lids are normal.     Conjunctiva/sclera: Conjunctivae normal.     Pupils: Pupils are equal, round, and reactive to light.  Neck:     Thyroid: No thyroid mass.     Trachea: No tracheal deviation.  Cardiovascular:     Rate and Rhythm: Normal rate and regular rhythm.     Heart sounds: Normal heart sounds. No murmur heard.    No gallop.  Pulmonary:     Effort: Pulmonary effort is normal. No respiratory distress.     Breath sounds: Normal breath sounds. No stridor. No decreased breath sounds, wheezing, rhonchi or rales.  Abdominal:     General: There is no distension.     Palpations: Abdomen is soft.     Tenderness: There is  no abdominal tenderness. There is no rebound.  Musculoskeletal:        General: No tenderness. Normal range of motion.     Cervical back: Normal range of motion and neck supple.  Skin:    General: Skin is warm and dry.     Findings: No abrasion or rash.  Neurological:     Mental Status: He is alert and oriented to person, place, and time. Mental status is at baseline.     GCS: GCS eye subscore is 4. GCS verbal subscore is 5. GCS motor subscore is 6.     Cranial Nerves: No cranial nerve deficit.     Sensory: No sensory deficit.     Motor: Motor function is intact.  Psychiatric:        Attention and Perception: Attention normal.        Speech: Speech normal.        Behavior: Behavior normal.     ED Results / Procedures / Treatments   Labs (all labs ordered are listed, but only abnormal results are displayed) Labs  Reviewed  BASIC METABOLIC PANEL  CBC WITH DIFFERENTIAL/PLATELET  URINALYSIS, ROUTINE W REFLEX MICROSCOPIC    EKG None  Radiology No results found.  Procedures Procedures    Medications Ordered in ED Medications - No data to display  ED Course/ Medical Decision Making/ A&P                           Medical Decision Making Risk Prescription drug management.   Patient here with evidence of new kidney injury.  Discussed with nephrology on-call who recommends patient be admitted.  Recommend patient get IV hydration which have started here.  Discussed with hospitalist team and they will admit the patient        Final Clinical Impression(s) / ED Diagnoses Final diagnoses:  None    Rx / DC Orders ED Discharge Orders     None         Lacretia Leigh, MD 06/05/22 2142

## 2022-06-05 NOTE — Progress Notes (Signed)
[place] [phone]   [phone]  PROGRESS NOTE  Patient Care Team: [name], FNP as PCP - General (General Practice) Rex Kras, [name], RN as [place] History # Iron Deficiency Anemia in the Setting of GI Bleeding #Thrombocytopenia/Leukopenia # Myelodysplasia  08/12/2020: patient underwent an EGD which showed two non-bleeding cratered duodenal ulcers and multiple dispersed erosions with stigmata of recent bleeding. Hgb 7.0, MCV 79 08/23/2020: establish care with Dr. Lorenso Courier  10/18/2020: WBC 3.4, Hgb 11.5, MCV 81.6, Plt 127. Iron sat 49%, ferritin 44 01/20/2021: WBC 2.2, Hgb 11.7, MCV 82, Plt 78 03/03/2021: Bone marrow biopsy showed normocellular marrow with mild erythroid hyperplasia. Minor dysplasia noted.  12/04/2021: WBC 2.1, Hgb 12.9, MCV 88.1, Plt 65  Interval History:  Trevor Murphy 87 y.o. male with medical history significant for pancytopenia who presents for a follow up visit. The patient's last visit was on 12/04/2021. In the interim since the last visit he has had no major changes in his health.  On exam today [name] notes he has been doing "pretty good" in the interim since her last visit.  He has had no hospitalizations or emergency room visits.  He reports he does not have a whole lot of energy.  His energy is currently a 7 out of 10.  He notes that he eats red meat daily and eats hamburgers daily.  He reports that he really does enjoy eating beans.  He has had no issues with bleeding, bruising, or dark stools while on Eliquis therapy.  He does note that he had some issues with blood in his urine since his last visit and was evaluated by Dr. Zadie Rhine.  During that time he was started on finasteride.  He notes that he has had no infectious symptoms such as fevers, chills, sweats, nausea, vomiting or diarrhea.  He had no focal symptoms such as cough, shortness of breath, chest pain, or  urinary tract infection.  A full 10 point ROS is listed below.  MEDICAL HISTORY:  Past Medical History:  Diagnosis Date   Arthritis    CAD (coronary artery disease)    a. Cath 08/2009: minimal nonobstructive disease (25% LAD). b. Lexiscan nuc 6/215: EF 54%, no ischemia or infarction.   Chronic diastolic CHF (congestive heart failure) ([place])    a. Echo 2013: EF 55-60%, mild AI, mild MR. b. Echo 5/15 with EF 60-65%, no WMA, mild LVH, mild AI.   CKD (chronic kidney disease) stage 3, GFR 30-59 ml/min (HCC) 07/26/2012   a. Felt likely due to HTN.   CVA (cerebral infarction) 07/28/2012   Glaucoma    Gout    H/O umbilical hernia repair    Headache(784.0)    " WHEN MY BLOOD PRESSURE IS UP"   History of acute renal failure    History of alcohol abuse    HTN (hypertension)    Hypertension    a. Patent renal arteries 2005.   Noncompliance    OA (osteoarthritis)    Other and unspecified hyperlipidemia    Poor circulation    Sinus bradycardia    SVT (supraventricular tachycardia) ([place])    a. Possible AVNRT.  Got adenosine from EMS in 2015 for SVT.  During 5/15 hospitalization had episode of short R-P tachycardia (possible AVNRT) terminated by adenosine.  b. 04/2016 - recurrent tachycardia (SVT vs. AVNRT), again terminated by Adenosine   Visual impairment     SURGICAL HISTORY: Past Surgical History:  Procedure Laterality  Date   APPENDECTOMY     BIOPSY  08/12/2020   Procedure: BIOPSY;  Surgeon: Juanita Craver, MD;  Location: [facility] ENDOSCOPY;  Service: Endoscopy;;   BUBBLE STUDY  05/24/2019   Procedure: BUBBLE STUDY;  Surgeon: Donato Heinz, MD;  Location: [place];  Service: Endoscopy;;   CARDIAC CATHETERIZATION  11/2003   EF 50-55%   CARDIAC CATHETERIZATION  08/2009   CATARACT EXTRACTION EXTRACAPSULAR Right 05/17/2013   Procedure: CATARACT EXTRACTION EXTRACAPSULAR WITH INTRAOCULAR LENS PLACEMENT ([place]) RIGHT EYE;  Surgeon: Marylynn Pearson, MD;  Location: [facility];  Service: Ophthalmology;   Laterality: Right;   COLONOSCOPY     ESOPHAGOGASTRODUODENOSCOPY (EGD) WITH PROPOFOL N/A 08/12/2020   Procedure: ESOPHAGOGASTRODUODENOSCOPY (EGD) WITH PROPOFOL;  Surgeon: Juanita Craver, MD;  Location: [facility] ENDOSCOPY;  Service: Endoscopy;  Laterality: N/A;   HERNIA REPAIR     LOOP RECORDER INSERTION N/A 05/24/2019   Procedure: LOOP RECORDER INSERTION;  Surgeon: Constance Haw, MD;  Location: [facility] CV LAB;  Service: Cardiovascular;  Laterality: N/A;   TEE WITHOUT CARDIOVERSION N/A 05/24/2019   Procedure: TRANSESOPHAGEAL ECHOCARDIOGRAM (TEE);  Surgeon: Donato Heinz, MD;  Location: [facility] ENDOSCOPY;  Service: Endoscopy;  Laterality: N/A;   VENTRAL HERNIA REPAIR  03/07/2012   Procedure: LAPAROSCOPIC VENTRAL HERNIA;  Surgeon: Gwenyth Ober, MD;  Location: [place];  Service: General;  Laterality: N/A;    SOCIAL HISTORY: Social History   Socioeconomic History   Marital status: Married    Spouse name: Not on file   Number of children: Not on file   Years of education: Not on file   Highest education level: Not on file  Occupational History   Occupation: retired  Tobacco Use   Smoking status: Never   Smokeless tobacco: Never  Vaping Use   Vaping Use: Never used  Substance and Sexual Activity   Alcohol use: No    Alcohol/week: 1.0 standard drink of alcohol    Types: 1 Cans of beer per week    Comment: h/o heavy use, quit in 2017   Drug use: No   Sexual activity: Not Currently  Other Topics Concern   Not on file  Social History Narrative   Not on file   Social Determinants of Health   Financial Resource Strain: Low Risk  (09/17/2021)   Overall Financial Resource Strain (CARDIA)    Difficulty of Paying Living Expenses: Not hard at all  Food Insecurity: No Food Insecurity (09/17/2021)   Hunger Vital Sign    Worried About Running Out of Food in the Last Year: Never true    [place] in the Last Year: Never true  Transportation Needs: No Transportation Needs (09/17/2021)    PRAPARE - Hydrologist (Medical): No    Lack of Transportation (Non-Medical): No  Physical Activity: Inactive (09/17/2021)   Exercise Vital Sign    Days of Exercise per Week: 0 days    Minutes of Exercise per Session: 0 min  Stress: No Stress Concern Present (09/17/2021)   [place]    Feeling of Stress : Not at all  Social Connections: Moderately Isolated (04/15/2021)   Social Connection and Isolation Panel [NHANES]    Frequency of Communication with Friends and Family: More than three times a week    Frequency of Social Gatherings with Friends and Family: More than three times a week    Attends Religious Services: More than 4 times per year  Active Member of Clubs or Organizations: No    Attends Archivist Meetings: Never    Marital Status: Widowed  Intimate Partner Violence: Not At Risk (07/22/2018)   Humiliation, Afraid, Rape, and Kick questionnaire    Fear of Current or Ex-Partner: No    Emotionally Abused: No    Physically Abused: No    Sexually Abused: No    FAMILY HISTORY: Family History  Problem Relation Age of Onset   CVA Mother    Hypertension Brother    Hypertension Sister    Hypertension Brother    Stroke Sister    Heart attack Neg Hx     ALLERGIES:  has No Known Allergies.  MEDICATIONS:  Current Outpatient Medications  Medication Sig Dispense Refill   acetaminophen (TYLENOL) 650 MG CR tablet Take 650 mg by mouth every 8 (eight) hours as needed for pain.     apixaban (ELIQUIS) 2.5 MG TABS tablet TAKE 1 TABLET(2.5 MG) BY MOUTH TWICE DAILY 60 tablet 5   atorvastatin (LIPITOR) 80 MG tablet TAKE 1 TABLET(80 MG) BY MOUTH DAILY AT 6 PM 90 tablet 1   ferrous sulfate (FEROSUL) 325 (65 FE) MG tablet TAKE 1 TABLET(325 MG) BY MOUTH DAILY 90 tablet 1   finasteride (PROSCAR) 5 MG tablet Take 5 mg by mouth daily.     LUMIGAN 0.01 % SOLN Place 1 drop into both eyes at  bedtime.     SIMBRINZA 1-0.2 % SUSP Apply to eye.     No current facility-administered medications for this visit.    REVIEW OF SYSTEMS:   Constitutional: ( - ) fevers, ( - )  chills , ( - ) night sweats Eyes: ( - ) blurriness of vision, ( - ) double vision, ( - ) watery eyes Ears, nose, mouth, throat, and face: ( - ) mucositis, ( - ) sore throat Respiratory: ( - ) cough, ( - ) dyspnea, ( - ) wheezes Cardiovascular: ( - ) palpitation, ( - ) chest discomfort, ( - ) lower extremity swelling Gastrointestinal:  ( - ) nausea, ( - ) heartburn, ( - ) change in bowel habits Skin: ( - ) abnormal skin rashes Lymphatics: ( - ) new lymphadenopathy, ( - ) easy bruising Neurological: ( - ) numbness, ( - ) tingling, ( - ) new weaknesses Behavioral/Psych: ( - ) mood change, ( - ) new changes  All other systems were reviewed with the patient and are negative.  PHYSICAL EXAMINATION:  There were no vitals filed for this visit.   There were no vitals filed for this visit.    GENERAL: well appearing elderly African American male alert, no distress and comfortable SKIN: skin color, texture, turgor are normal, no rashes or significant lesions EYES: conjunctiva are pink and non-injected, sclera clear LUNGS: clear to auscultation and percussion with normal breathing effort HEART: regular rate & rhythm and no murmurs and no lower extremity edema Musculoskeletal: no cyanosis of digits and no clubbing  PSYCH: alert & oriented x 3, fluent speech NEURO: no focal motor/sensory deficits  LABORATORY DATA:  I have reviewed the data as listed    Latest Ref Rng & Units 04/20/2022   11:06 AM 01/13/2022    3:29 PM 12/04/2021    2:36 PM  CBC  WBC 3.4 - 10.8 x10E3/uL 2.9  2.7  2.1   Hemoglobin 13.0 - 17.7 g/dL 11.7  12.4  12.9   Hematocrit 37.5 - 51.0 % 36.2  39.5  40.7   Platelets 150 -  450 x10E3/uL 67  94  65        Latest Ref Rng & Units 04/20/2022   11:06 AM 12/04/2021    2:36 PM 11/03/2021   11:56 AM   CMP  Glucose 70 - 99 mg/dL 91  110  103   BUN 8 - 27 mg/dL _0 Creatinine 0.76 - 1.27 mg/dL 1.55  1.60  1.85   Sodium 134 - 144 mmol/L 139  139  138   Potassium 3.5 - 5.2 mmol/L 4.8  4.8  4.7   Chloride 96 - 106 mmol/L 108  111  107   CO2 20 - 29 mmol/L _1 Calcium 8.6 - 10.2 mg/dL 7.4  8.1  7.9   Total Protein 6.0 - 8.5 g/dL 6.5  7.0    Total Bilirubin 0.0 - 1.2 mg/dL 1.4  1.1    Alkaline Phos 44 - 121 IU/L 119  111    AST 0 - 40 IU/L 28  28    ALT 0 - 44 IU/L 17  25      Lab Results  Component Value Date   MPROTEIN Not Observed 08/23/2020   Lab Results  Component Value Date   KPAFRELGTCHN 61.1 (H) 08/23/2020   LAMBDASER 34.8 (H) 08/23/2020   KAPLAMBRATIO 1.76 (H) 08/23/2020     RADIOGRAPHIC STUDIES: CUP PACEART REMOTE DEVICE CHECK  Result Date: 06/03/2022 ILR summary report received. Battery status OK. Normal device function. No new symptom, tachy, brady, or pause episodes. No new AF episodes. Monthly summary reports and ROV/PRN LA    ASSESSMENT & PLAN JENTRY Trevor Murphy 87 y.o. male with medical history significant for iron deficiency anemia 2/2 to GI bleeding who presents for a follow up visit.  After review the labs, the records, schedule the patient the findings most consistent with iron deficiency anemia due to GI bleeding which is properly responding to p.o. iron therapy.  His hemoglobin is steady at 11.6 and he has no overt signs or symptoms of anemia at this time.  I would recommend we continue with his p.o. iron therapy and have him return in 6 months time in order to assure his labs are stable.   Unfortunately on exam today he has continued leukopenia and thrombocytopenia.  The reason for this is not clear.  He did previously have borderline low levels of vitamin B12 but fortunately had a negative multiple myeloma panel.  Possible etiologies include nutritional deficiency, liver dysfunction, or bone marrow dysfunction.  Bone marrow biopsy performed on  03/04/2019 did not show any clear signs of abnormality.  At this time we will continue to monitor his cytopenias.  # Worsening Cr, AKI on CKD -- Creatinine increased to 3.07 from a baseline of 1.5.  Calcium 7.5 -- Recommend patient proceed to the emergency department for work-up for urinary obstruction versus alternative etiology for his AKI on CKD.  # Iron Deficiency Anemia in the Setting of GI Bleeding -- findings most consistent with iron deficiency anemia in the setting of GI bleed.   --Hgb stable at 11.6, recommend continue PO ferrous sulfate 364m PO QOD.  -- no alternative sources of anemia noted on our workup.  -- May be a component of CKD contributing to the patient's anemia.  Can consider erythropoietin treatment if Hgb drops <10 -- Additional nutritional labs with folic acid and vitamin B12 were WNL. -- Plan for return to clinic in 6 months time for continued monitoring.  #  Thrombocytopenia #Leukopenia, Neutropenia -- Etiology is unclear.  The patient is always had borderline low levels of platelets and white blood cell counts, however there has been a steady drop on his counts today --WBC 2.8, Plt 62 -- full nutritional panel including vitamin B12, folate, methylmalonic acid, homocystine as well as copper has been negative  --negative HIV, hepatitis B and C panels --peripheral blood film shows no concerning findings.  --Ultrasound showed no clear evidence of liver disease or splenomegaly on 02/12/21 --because no clear etiology could found we pursued a bone marrow biopsy.  This has showed some mild dysplasia but no overt abnormalities. --Continue to monitor  No orders of the defined types were placed in this encounter.   All questions were answered. The patient knows to call the clinic with any problems, questions or concerns.  A total of more than 30 minutes were spent on this encounter and over half of that time was spent on counseling and coordination of care as outlined  above.   Ledell Peoples, MD Department of Hematology/Oncology [place] at [facility] Phone: [phone] Pager: [phone] Email: [email or web address]  06/05/2022 3:10 PM

## 2022-06-06 ENCOUNTER — Observation Stay (HOSPITAL_COMMUNITY): Payer: Medicare Other

## 2022-06-06 ENCOUNTER — Other Ambulatory Visit: Payer: Self-pay

## 2022-06-06 DIAGNOSIS — I130 Hypertensive heart and chronic kidney disease with heart failure and stage 1 through stage 4 chronic kidney disease, or unspecified chronic kidney disease: Secondary | ICD-10-CM | POA: Diagnosis present

## 2022-06-06 DIAGNOSIS — I10 Essential (primary) hypertension: Secondary | ICD-10-CM | POA: Diagnosis not present

## 2022-06-06 DIAGNOSIS — Z8249 Family history of ischemic heart disease and other diseases of the circulatory system: Secondary | ICD-10-CM | POA: Diagnosis not present

## 2022-06-06 DIAGNOSIS — Z9049 Acquired absence of other specified parts of digestive tract: Secondary | ICD-10-CM | POA: Diagnosis not present

## 2022-06-06 DIAGNOSIS — N1832 Chronic kidney disease, stage 3b: Secondary | ICD-10-CM | POA: Diagnosis not present

## 2022-06-06 DIAGNOSIS — N281 Cyst of kidney, acquired: Secondary | ICD-10-CM | POA: Diagnosis not present

## 2022-06-06 DIAGNOSIS — D509 Iron deficiency anemia, unspecified: Secondary | ICD-10-CM | POA: Diagnosis not present

## 2022-06-06 DIAGNOSIS — H409 Unspecified glaucoma: Secondary | ICD-10-CM | POA: Diagnosis present

## 2022-06-06 DIAGNOSIS — Z7901 Long term (current) use of anticoagulants: Secondary | ICD-10-CM | POA: Diagnosis not present

## 2022-06-06 DIAGNOSIS — Z79899 Other long term (current) drug therapy: Secondary | ICD-10-CM | POA: Diagnosis not present

## 2022-06-06 DIAGNOSIS — I5032 Chronic diastolic (congestive) heart failure: Secondary | ICD-10-CM | POA: Diagnosis present

## 2022-06-06 DIAGNOSIS — N179 Acute kidney failure, unspecified: Secondary | ICD-10-CM | POA: Diagnosis not present

## 2022-06-06 DIAGNOSIS — E785 Hyperlipidemia, unspecified: Secondary | ICD-10-CM | POA: Diagnosis present

## 2022-06-06 DIAGNOSIS — I480 Paroxysmal atrial fibrillation: Secondary | ICD-10-CM | POA: Diagnosis not present

## 2022-06-06 DIAGNOSIS — Z8673 Personal history of transient ischemic attack (TIA), and cerebral infarction without residual deficits: Secondary | ICD-10-CM | POA: Diagnosis not present

## 2022-06-06 DIAGNOSIS — M109 Gout, unspecified: Secondary | ICD-10-CM | POA: Diagnosis present

## 2022-06-06 DIAGNOSIS — R531 Weakness: Secondary | ICD-10-CM | POA: Diagnosis present

## 2022-06-06 DIAGNOSIS — D469 Myelodysplastic syndrome, unspecified: Secondary | ICD-10-CM | POA: Diagnosis present

## 2022-06-06 DIAGNOSIS — Z9841 Cataract extraction status, right eye: Secondary | ICD-10-CM | POA: Diagnosis not present

## 2022-06-06 DIAGNOSIS — D696 Thrombocytopenia, unspecified: Secondary | ICD-10-CM | POA: Diagnosis present

## 2022-06-06 DIAGNOSIS — R310 Gross hematuria: Secondary | ICD-10-CM | POA: Diagnosis present

## 2022-06-06 DIAGNOSIS — K274 Chronic or unspecified peptic ulcer, site unspecified, with hemorrhage: Secondary | ICD-10-CM | POA: Diagnosis present

## 2022-06-06 DIAGNOSIS — I2510 Atherosclerotic heart disease of native coronary artery without angina pectoris: Secondary | ICD-10-CM | POA: Diagnosis not present

## 2022-06-06 LAB — CBC
HCT: 34.7 % — ABNORMAL LOW (ref 39.0–52.0)
Hemoglobin: 11.3 g/dL — ABNORMAL LOW (ref 13.0–17.0)
MCH: 30.1 pg (ref 26.0–34.0)
MCHC: 32.6 g/dL (ref 30.0–36.0)
MCV: 92.3 fL (ref 80.0–100.0)
Platelets: 49 10*3/uL — ABNORMAL LOW (ref 150–400)
RBC: 3.76 MIL/uL — ABNORMAL LOW (ref 4.22–5.81)
RDW: 16.8 % — ABNORMAL HIGH (ref 11.5–15.5)
WBC: 3 10*3/uL — ABNORMAL LOW (ref 4.0–10.5)
nRBC: 0 % (ref 0.0–0.2)

## 2022-06-06 LAB — URINALYSIS, ROUTINE W REFLEX MICROSCOPIC
Bilirubin Urine: NEGATIVE
Glucose, UA: NEGATIVE mg/dL
Ketones, ur: NEGATIVE mg/dL
Leukocytes,Ua: NEGATIVE
Nitrite: NEGATIVE
Protein, ur: 100 mg/dL — AB
RBC / HPF: >50 RBC/hpf — ABNORMAL HIGH (ref 0–5)
Specific Gravity, Urine: 1.027 (ref 1.005–1.030)
pH: 5 (ref 5.0–8.0)

## 2022-06-06 LAB — COMPREHENSIVE METABOLIC PANEL
ALT: 25 U/L (ref 0–44)
AST: 33 U/L (ref 15–41)
Albumin: 2.7 g/dL — ABNORMAL LOW (ref 3.5–5.0)
Alkaline Phosphatase: 84 U/L (ref 38–126)
Anion gap: 6 (ref 5–15)
BUN: 35 mg/dL — ABNORMAL HIGH (ref 8–23)
CO2: 20 mmol/L — ABNORMAL LOW (ref 22–32)
Calcium: 7.5 mg/dL — ABNORMAL LOW (ref 8.9–10.3)
Chloride: 112 mmol/L — ABNORMAL HIGH (ref 98–111)
Creatinine, Ser: 2.82 mg/dL — ABNORMAL HIGH (ref 0.61–1.24)
GFR, Estimated: 21 mL/min — ABNORMAL LOW (ref >60)
Glucose, Bld: 91 mg/dL (ref 70–99)
Potassium: 4 mmol/L (ref 3.5–5.1)
Sodium: 138 mmol/L (ref 135–145)
Total Bilirubin: 1.7 mg/dL — ABNORMAL HIGH (ref 0.3–1.2)
Total Protein: 6.1 g/dL — ABNORMAL LOW (ref 6.5–8.1)

## 2022-06-06 LAB — GLUCOSE, CAPILLARY: Glucose-Capillary: 60 mg/dL — ABNORMAL LOW (ref 70–99)

## 2022-06-06 MED ORDER — ORAL CARE MOUTH RINSE: 15.0000 mL | OROMUCOSAL | Status: DC | PRN

## 2022-06-06 MED ORDER — BRIMONIDINE TARTRATE 0.2 % OP SOLN
1.0000 [drp] | Freq: Three times a day (TID) | OPHTHALMIC | Status: DC
  Administered 2022-06-06 – 2022-06-09 (×10): 1 [drp] via OPHTHALMIC
  Filled 2022-06-06: qty 5

## 2022-06-06 MED ORDER — BRINZOLAMIDE 1 % OP SUSP
1.0000 [drp] | Freq: Three times a day (TID) | OPHTHALMIC | Status: DC
  Administered 2022-06-06 – 2022-06-09 (×10): 1 [drp] via OPHTHALMIC
  Filled 2022-06-06: qty 10

## 2022-06-06 NOTE — Plan of Care (Signed)

## 2022-06-06 NOTE — Progress Notes (Signed)
  Progress Note   Patient: Trevor Murphy EXN:[id number] DOB: 02/10/1935 DOA: 06/05/2022     0 DOS: the patient was seen and examined on 06/06/2022   Brief hospital course:  Trevor Murphy is a 87 y.o. male with medical history significant of  IDA insetting of GI bleed due to PUD, thrombocytopenia, leukopenia,, myelodysplasia, CAD nonobstructive disease, CKDIIIa, Essential hypertension, CVA, Gout ,SVT who presents to ED in referral from heme clinic due to increase in CR from 1.55 to 3. Patient notes no n/v/d/abdominal pain, chest pain, sob , recent hospitalizations  or new medications.  He notes he feels very well. He denies any dark stools or bleeding.   Assessment and Plan: Acute renal failure on CKDIIIa -Likely prerenal in nature poor oral intake. -Serum creatinine is slowly improving with hydration -Continue hydration with IV fluids, discussed case with nephrology, will repeat serum creatinine in the morning -Check  ultrasound of his kidneys to rule out postrenal causes.  IDA  -insetting of GI bleed due to PUD -stable at baseline  -followed by heme    MDS Thrombocytopenia Leukopenia -stable at baseline  -followed by heme    CAD nonobstructive disease] -no active issues , resume home regimen    Essential hypertension -uncontrolled in ED --resume home regimen  -prn as needed     CVA -resume secondary ppx     Gout  -no active issues      DVT prophylaxis: scd Code Status: full Family Communication: Trevor Murphy son [phone]       Subjective: Overnight events.  No new complaints this morning.  Serum creatinine slowly improving.  Physical Exam: Physical Exam Constitutional:      Appearance: Normal appearance.  HENT:     Head: Normocephalic and atraumatic.     Nose: Nose normal.     Mouth/Throat:     Mouth: Mucous membranes are dry.  Eyes:     Extraocular Movements: Extraocular movements intact.     Pupils: Pupils are equal, round, and reactive to light.   Cardiovascular:     Rate and Rhythm: Normal rate and regular rhythm.  Pulmonary:     Effort: Pulmonary effort is normal.     Breath sounds: Normal breath sounds.  Abdominal:     General: Bowel sounds are normal.     Palpations: Abdomen is soft.  Musculoskeletal:     Cervical back: Normal range of motion.  Skin:    General: Skin is warm and dry.  Neurological:     General: No focal deficit present.     Mental Status: He is alert and oriented to person, place, and time. Mental status is at baseline.  Psychiatric:        Mood and Affect: Mood normal.     Vitals:   06/06/22 0041 06/06/22 0145 06/06/22 0454 06/06/22 0857  BP:  (!) 177/79 (!) 177/70 (!) 154/72  Pulse:  61 63 (!) 51  Resp:  '15 18 16  '$ Temp: 97.6 F (36.4 C)  97.8 F (36.6 C) (!) 97.4 F (36.3 C)  TempSrc: Oral  Oral Oral  SpO2:  100% 100% 100%   Data Reviewed:  There are no new results to review at this time.  Family Communication:   Disposition: Status is: Observation The patient remains OBS appropriate and will d/c before 2 midnights.  Planned Discharge Destination: Home    Time spent: 15 minutes  Author: Cristela Felt, MD 06/06/2022 1:26 PM  For on call review [email or web address].

## 2022-06-07 LAB — COMPREHENSIVE METABOLIC PANEL
ALT: 23 U/L (ref 0–44)
AST: 31 U/L (ref 15–41)
Albumin: 2.4 g/dL — ABNORMAL LOW (ref 3.5–5.0)
Alkaline Phosphatase: 85 U/L (ref 38–126)
Anion gap: 1 — ABNORMAL LOW (ref 5–15)
BUN: 31 mg/dL — ABNORMAL HIGH (ref 8–23)
CO2: 20 mmol/L — ABNORMAL LOW (ref 22–32)
Calcium: 6.9 mg/dL — ABNORMAL LOW (ref 8.9–10.3)
Chloride: 114 mmol/L — ABNORMAL HIGH (ref 98–111)
Creatinine, Ser: 2.44 mg/dL — ABNORMAL HIGH (ref 0.61–1.24)
GFR, Estimated: 25 mL/min — ABNORMAL LOW (ref >60)
Glucose, Bld: 107 mg/dL — ABNORMAL HIGH (ref 70–99)
Potassium: 4.6 mmol/L (ref 3.5–5.1)
Sodium: 135 mmol/L (ref 135–145)
Total Bilirubin: 1.1 mg/dL (ref 0.3–1.2)
Total Protein: 5.6 g/dL — ABNORMAL LOW (ref 6.5–8.1)

## 2022-06-07 LAB — GLUCOSE, CAPILLARY: Glucose-Capillary: 77 mg/dL (ref 70–99)

## 2022-06-07 NOTE — Progress Notes (Signed)
  Progress Note   Patient: Trevor Murphy WGN:[id number] DOB: 22-Sep-1934 DOA: 06/05/2022     1 DOS: the patient was seen and examined on 06/07/2022   Brief hospital course:  Trevor Murphy is a 87 y.o. male with medical history significant of  IDA insetting of GI bleed due to PUD, thrombocytopenia, leukopenia,, myelodysplasia, CAD nonobstructive disease, CKDIIIa, Essential hypertension, CVA, Gout ,SVT who presents to ED in referral from heme clinic due to increase in CR from 1.55 to 3. Patient notes no n/v/d/abdominal pain, chest pain, sob , recent hospitalizations  or new medications.  He notes he feels very well. He denies any dark stools or bleeding.   Assessment and Plan: Acute renal failure on CKDIIIa -Likely prerenal in nature poor oral intake. -Serum creatinine is slowly improving with hydration -Continue hydration with IV fluids, discussed case with nephrology, creatinine improved to 2.3.  ultrasound of his kidneys showed no hydronephrosis.     IDA  -insetting of GI bleed due to PUD -stable at baseline  -followed by heme    MDS Thrombocytopenia Leukopenia -stable at baseline  -followed by heme    CAD nonobstructive disease] -no active issues , resume home regimen    Essential hypertension --resume home regimen  -prn as needed     CVA -resume secondary ppx     Gout  -no active issues      DVT prophylaxis: scd Code Status: full Family Communication: Trevor Murphy son [phone]       Subjective: Seen this morning eating breakfast, he has no complaints.  He is hopeful to go home tomorrow.  Physical Exam: Vitals:   06/06/22 1437 06/06/22 1720 06/06/22 2017 06/07/22 0330  BP: (!) 169/80 139/75 116/72 137/66  Pulse: 67 (!) 58 (!) 59 (!) 49  Resp: '14 14 14 16  '$ Temp: (!) 97.5 F (36.4 C) 97.6 F (36.4 C) 97.7 F (36.5 C) (!) 97.5 F (36.4 C)  TempSrc: Oral Oral Oral Oral  SpO2: 100% 100% 100% 100%  Weight:    66.6 kg  Constitutional:      Appearance: Normal  appearance.  HENT:     Head: Normocephalic and atraumatic.     Nose: Nose normal.     Mouth/Throat:     Mouth: Mucous membranes are moist Eyes:     Extraocular Movements: Extraocular movements intact.     Pupils: Pupils are equal, round, and reactive to light.  Cardiovascular:     Rate and Rhythm: Normal rate and regular rhythm.  Pulmonary:     Effort: Pulmonary effort is normal.     Breath sounds: Normal breath sounds.  Abdominal:     General: Bowel sounds are normal.     Palpations: Abdomen is soft.  Musculoskeletal:     Cervical back: Normal range of motion.  Skin:    General: Skin is warm and dry.  Neurological:     General: No focal deficit present.     Mental Status: He is alert and oriented to person, place, and time. Mental status is at baseline.  Psychiatric:        Mood and Affect: Mood normal.     Data Reviewed:  There are no new results to review at this time.  Family Communication:   Disposition: Status is: Inpatient Remains inpatient appropriate because: AKI  Planned Discharge Destination: Home    Time spent: 15 minutes  Author: Cristela Felt, MD 06/07/2022 12:56 PM  For on call review [email or web address].

## 2022-06-08 ENCOUNTER — Telehealth: Payer: Self-pay | Admitting: Hematology and Oncology

## 2022-06-08 DIAGNOSIS — N1832 Chronic kidney disease, stage 3b: Secondary | ICD-10-CM

## 2022-06-08 DIAGNOSIS — I10 Essential (primary) hypertension: Secondary | ICD-10-CM

## 2022-06-08 DIAGNOSIS — N179 Acute kidney failure, unspecified: Secondary | ICD-10-CM | POA: Diagnosis not present

## 2022-06-08 DIAGNOSIS — I2510 Atherosclerotic heart disease of native coronary artery without angina pectoris: Secondary | ICD-10-CM

## 2022-06-08 DIAGNOSIS — Z8673 Personal history of transient ischemic attack (TIA), and cerebral infarction without residual deficits: Secondary | ICD-10-CM

## 2022-06-08 DIAGNOSIS — I480 Paroxysmal atrial fibrillation: Secondary | ICD-10-CM

## 2022-06-08 DIAGNOSIS — D509 Iron deficiency anemia, unspecified: Secondary | ICD-10-CM

## 2022-06-08 LAB — GLUCOSE, CAPILLARY: Glucose-Capillary: 65 mg/dL — ABNORMAL LOW (ref 70–99)

## 2022-06-08 MED ORDER — ATORVASTATIN CALCIUM 40 MG PO TABS
80.0000 mg | ORAL_TABLET | Freq: Every day | ORAL | Status: DC
  Administered 2022-06-08 – 2022-06-09 (×2): 80 mg via ORAL
  Filled 2022-06-08 (×2): qty 2

## 2022-06-08 MED ORDER — CALCIUM CARBONATE ANTACID 500 MG PO CHEW
1.0000 | CHEWABLE_TABLET | Freq: Three times a day (TID) | ORAL | Status: AC
  Administered 2022-06-08 – 2022-06-09 (×3): 200 mg via ORAL
  Filled 2022-06-08 (×3): qty 1

## 2022-06-08 NOTE — Telephone Encounter (Signed)
Per 10/27 los called and spoke to pt sister about appointment

## 2022-06-08 NOTE — Progress Notes (Signed)
Mobility Specialist - Progress Note   06/08/22 0959  Mobility  Activity Ambulated with assistance in hallway  Level of Assistance Minimal assist, patient does 75% or more  Assistive Device Front wheel walker  Distance Ambulated (ft) 300 ft  Activity Response Tolerated well  Mobility Referral Yes  $Mobility charge 1 Mobility   Pt received in bed and agreed to mobility, no c/o pain nor discomfort during ambulation just some fatigue. Pt back to chair with all needs met and chair alarm on, requesting another session in PM.  Roderick Pee Mobility Specialist

## 2022-06-08 NOTE — Progress Notes (Signed)
PROGRESS NOTE    Trevor Murphy  HEN:[id number] DOB: 02-16-1935 DOA: 06/05/2022 PCP: [name], FNP   Brief Narrative: Trevor Murphy is a 87 y.o. male with a history of iron deficiency anemia, CKD stage IIIb, myelodysplastic anemia, CAD, hypertension, CVA, gout. Patient presented secondary to abnormal lab. Patient found to have AKI on CKD. IV fluids initiated.   Assessment and Plan:  AKI on CKD stage IIIb Baseline creatinine of about 1.5-1.6. Creatinine of 3.07 on admission. Patient started on IV fluids with steady improvement of creatinine. Creatinine of 2.44 on BMP this morning. -Continue IV fluids -BMP in AM  Iron deficiency anemia Hemoglobin stable. -Continue ferrous sulfate  Thrombocytopenia Chronic and stable. Patient follows with hematology/oncology.  Leukopenia Patient follows with hematology/oncology.  CAD No chest pain.  Paroxysmal atrial fibrillation -Continue Eliquis  Primary hypertension Patient does not appear to be on antihypertensive medication regimen. Blood pressure slightly elevated.  History of CVA -Continue Lipitor   DVT prophylaxis: Eliquis Code Status:   Code Status: Full Code Family Communication: None at bedside Disposition Plan: Discharge home in 1-2 days pending improvement   Consultants:  None  Procedures:  None  Antimicrobials: None    Subjective: Patient reports no issues overnight. Feels good today. Was walking with mobility.  Objective: BP (!) 151/58 (BP Location: Right Arm)   Pulse (!) 52   Temp 98.6 F (37 C)   Resp 17   Wt 68.7 kg   SpO2 91%   BMI 21.73 kg/m   Examination:  General exam: Appears calm and comfortable Respiratory system: Clear to auscultation. Respiratory effort normal. Cardiovascular system: S1 & S2 heard, RRR. Gastrointestinal system: Abdomen is nondistended, soft and nontender. Normal bowel sounds heard. Central nervous system: Alert and oriented. No focal neurological  deficits. Musculoskeletal: No calf tenderness Skin: No cyanosis. No rashes   Data Reviewed: I have personally reviewed following labs and imaging studies  CBC Lab Results  Component Value Date   WBC 3.0 (L) 06/06/2022   RBC 3.76 (L) 06/06/2022   HGB 11.3 (L) 06/06/2022   HCT 34.7 (L) 06/06/2022   MCV 92.3 06/06/2022   MCH 30.1 06/06/2022   PLT 49 (L) 06/06/2022   MCHC 32.6 06/06/2022   RDW 16.8 (H) 06/06/2022   LYMPHSABS 0.4 (L) 06/05/2022   MONOABS 0.3 06/05/2022   EOSABS 0.1 06/05/2022   BASOSABS 0.0 36/14/4315     Last metabolic panel Lab Results  Component Value Date   NA 135 06/07/2022   K 4.6 06/07/2022   CL 114 (H) 06/07/2022   CO2 20 (L) 06/07/2022   BUN 31 (H) 06/07/2022   CREATININE 2.44 (H) 06/07/2022   GLUCOSE 107 (H) 06/07/2022   GFRNONAA 25 (L) 06/07/2022   GFRAA 40 (L) 08/08/2020   CALCIUM 6.9 (L) 06/07/2022   PHOS 3.5 08/18/2009   PROT 5.6 (L) 06/07/2022   ALBUMIN 2.4 (L) 06/07/2022   LABGLOB 3.5 04/20/2022   AGRATIO 0.9 (L) 04/20/2022   BILITOT 1.1 06/07/2022   ALKPHOS 85 06/07/2022   AST 31 06/07/2022   ALT 23 06/07/2022   ANIONGAP 1 (L) 06/07/2022    GFR: Estimated Creatinine Clearance: 21.5 mL/min (A) (by C-G formula based on SCr of 2.44 mg/dL (H)).  No results found for this or any previous visit (from the past 240 hour(s)).    Radiology Studies: No results found.    LOS: 2 days    Cordelia Poche, MD [facility] 06/08/2022, 2:21 PM   If 7PM-7AM, please contact  night-coverage [email or web address]

## 2022-06-08 NOTE — Hospital Course (Addendum)
Trevor Murphy is a 87 y.o. male with a history of iron deficiency anemia, CKD stage IIIb, myelodysplastic anemia, CAD, hypertension, CVA, gout. Patient presented secondary to abnormal lab. Patient found to have AKI on CKD. IV fluids initiated.

## 2022-06-09 DIAGNOSIS — N179 Acute kidney failure, unspecified: Secondary | ICD-10-CM | POA: Diagnosis not present

## 2022-06-09 LAB — BASIC METABOLIC PANEL
Anion gap: 4 — ABNORMAL LOW (ref 5–15)
BUN: 23 mg/dL (ref 8–23)
CO2: 19 mmol/L — ABNORMAL LOW (ref 22–32)
Calcium: 7.1 mg/dL — ABNORMAL LOW (ref 8.9–10.3)
Chloride: 115 mmol/L — ABNORMAL HIGH (ref 98–111)
Creatinine, Ser: 1.86 mg/dL — ABNORMAL HIGH (ref 0.61–1.24)
GFR, Estimated: 35 mL/min — ABNORMAL LOW (ref >60)
Glucose, Bld: 99 mg/dL (ref 70–99)
Potassium: 4.3 mmol/L (ref 3.5–5.1)
Sodium: 138 mmol/L (ref 135–145)

## 2022-06-09 LAB — GLUCOSE, CAPILLARY
Glucose-Capillary: 68 mg/dL — ABNORMAL LOW (ref 70–99)
Glucose-Capillary: 94 mg/dL (ref 70–99)

## 2022-06-09 LAB — CBC
HCT: 36 % — ABNORMAL LOW (ref 39.0–52.0)
Hemoglobin: 11.3 g/dL — ABNORMAL LOW (ref 13.0–17.0)
MCH: 29.4 pg (ref 26.0–34.0)
MCHC: 31.4 g/dL (ref 30.0–36.0)
MCV: 93.8 fL (ref 80.0–100.0)
Platelets: 46 10*3/uL — ABNORMAL LOW (ref 150–400)
RBC: 3.84 MIL/uL — ABNORMAL LOW (ref 4.22–5.81)
RDW: 17.1 % — ABNORMAL HIGH (ref 11.5–15.5)
WBC: 2.8 10*3/uL — ABNORMAL LOW (ref 4.0–10.5)
nRBC: 0 % (ref 0.0–0.2)

## 2022-06-09 MED ORDER — SIMBRINZA 1-0.2 % OP SUSP: OPHTHALMIC | Status: AC

## 2022-06-09 NOTE — Discharge Summary (Signed)
Physician Discharge Summary   Patient: Trevor Murphy MRN: [id number] DOB: 08/06/1935  Admit date:     06/05/2022  Discharge date: 06/09/22  Discharge Physician: Cordelia Poche, MD   PCP: [name], FNP   Recommendations at discharge:  PCP follow-up BMP in 3-5 days Resume Simbrinza based on improved renal function  Discharge Diagnoses: Active Problems:   Essential hypertension   Chronic kidney disease, stage 3b ([place])   Coronary artery disease involving native coronary artery of native heart without angina pectoris   Paroxysmal atrial fibrillation (HCC)   Iron deficiency anemia   History of CVA (cerebrovascular accident)  Principal Problem (Resolved):   AKI (acute kidney injury) [facility])  Hospital Course: Trevor Murphy is a 87 y.o. male with a history of iron deficiency anemia, CKD stage IIIb, myelodysplastic anemia, CAD, hypertension, CVA, gout. Patient presented secondary to abnormal lab. Patient found to have AKI on CKD. IV fluids initiated. Creatinine trended down back toward baseline prior to discharge.  Assessment and Plan:  AKI on CKD stage IIIb Baseline creatinine of about 1.5-1.6. Creatinine of 3.07 on admission. Patient started on IV fluids with steady improvement of creatinine. Creatinine of 1.86 on BMP this morning.   Iron deficiency anemia Hemoglobin stable. -Continue ferrous sulfate   Thrombocytopenia Chronic and stable. Patient follows with hematology/oncology.   Leukopenia Patient follows with hematology/oncology.  Gross hematuria Chronic. Hemoglobin stable.   CAD No chest pain.   Paroxysmal atrial fibrillation Continue Eliquis   Primary hypertension Patient does not appear to be on antihypertensive medication regimen. Blood pressure slightly elevated.   History of CVA Continue Lipitor   Consultants: None Procedures performed: None  Disposition: Home Diet recommendation: Regular diet   DISCHARGE MEDICATION: Allergies as of 06/09/2022   No  Known Allergies      Medication List     STOP taking these medications    LORazepam 1 MG tablet Commonly known as: ATIVAN       TAKE these medications    acetaminophen 650 MG CR tablet Commonly known as: TYLENOL Take 650 mg by mouth every 8 (eight) hours as needed for pain.   apixaban 2.5 MG Tabs tablet Commonly known as: Eliquis TAKE 1 TABLET(2.5 MG) BY MOUTH TWICE DAILY   atorvastatin 80 MG tablet Commonly known as: LIPITOR TAKE 1 TABLET(80 MG) BY MOUTH DAILY AT 6 PM   ferrous sulfate 325 (65 FE) MG tablet Commonly known as: FeroSul TAKE 1 TABLET(325 MG) BY MOUTH DAILY   finasteride 5 MG tablet Commonly known as: PROSCAR Take 5 mg by mouth daily.   Lumigan 0.01 % Soln Generic drug: bimatoprost Place 1 drop into both eyes at bedtime.   Simbrinza 1-0.2 % Susp Generic drug: Brinzolamide-Brimonidine Follow up with your PCP on when to restart this medication. What changed:  how much to take how to take this when to take this additional instructions        Follow-up Information     [name], [place]. Schedule an appointment as soon as possible for a visit in 1 week(s).   Specialty: General Practice Why: For hospital follow-up Contact information: [address] [place]  [address] [phone]                Discharge Exam: BP (!) 162/76 (BP Location: Right Arm)   Pulse 64   Temp (!) 97.5 F (36.4 C) (Oral)   Resp 18   Wt 68.7 kg   SpO2 100%   BMI 21.73 kg/m  General exam: Appears calm and comfortable Respiratory system: Clear to auscultation. Respiratory effort normal. Cardiovascular system: S1 & S2 heard, RRR. Gastrointestinal system: Abdomen is nondistended, soft and nontender. Normal bowel sounds heard. Central nervous system: Alert and oriented. No focal neurological deficits. Musculoskeletal: Left upper extremity edema. No calf tenderness Skin: No cyanosis. No rashes Psychiatry: Judgement and insight appear  normal. Mood & affect appropriate.   Condition at discharge: stable  The results of significant diagnostics from this hospitalization (including imaging, microbiology, ancillary and laboratory) are listed below for reference.   Imaging Studies: US RENAL  Result Date: 06/06/2022 CLINICAL DATA:  Acute kidney injury. EXAM: RENAL / URINARY TRACT ULTRASOUND COMPLETE COMPARISON:  CT scan 02/23/2022 FINDINGS: Right Kidney: Renal measurements: 9.3 x 5.0 x 4.7 cm = volume: 114 mL. Markedly echogenic. Septated cyst identified upper pole measuring up to 2.7 cm. Left Kidney: Renal measurements: 10.0 x 5.4 x 5.3 cm = volume: 151 mL. Markedly echogenic. At least 2 cysts are identified measuring up to 2.9 cm Bladder: Appears normal for degree of bladder distention. Other: None. IMPRESSION: 1. Markedly echogenic kidneys bilaterally consistent with medical renal disease. No hydronephrosis. 2. Bilateral renal cysts. Electronically Signed   By: Misty Stanley M.D.   On: 06/06/2022 13:47   CUP PACEART REMOTE DEVICE CHECK  Result Date: 06/03/2022 ILR summary report received. Battery status OK. Normal device function. No new symptom, tachy, brady, or pause episodes. No new AF episodes. Monthly summary reports and ROV/PRN LA   Microbiology: Results for orders placed or performed in visit on 01/13/22  Urine Culture     Status: Abnormal   Collection Time: 01/13/22  3:31 PM   Specimen: Blood   UR  Result Value Ref Range Status   Urine Culture, Routine Final report (A)  Final   Organism ID, Bacteria Comment (A)  Final    Comment: Staphylococcus epidermidis Based on resistance to oxacillin this isolate would be resistant to all currently available beta-lactam antimicrobial agents, with the exception of the newer cephalosporins with anti-MRSA activity, such as Ceftaroline 25,000-50,000 colony forming units per mL    ORGANISM ID, BACTERIA Comment (A)  Final    Comment: Staphylococcus hominis Based on resistance  to oxacillin this isolate would be resistant to all currently available beta-lactam antimicrobial agents, with the exception of the newer cephalosporins with anti-MRSA activity, such as Ceftaroline 10,000-25,000 colony forming units per mL    Antimicrobial Susceptibility Comment  Final    Comment:       ** S = Susceptible; I = Intermediate; R = Resistant **                    P = Positive; N = Negative             MICS are expressed in micrograms per mL    Antibiotic                 RSLT#1    RSLT#2    RSLT#3    RSLT#4 Ciprofloxacin                  S         S Gentamicin                     S         S Levofloxacin                   S  S Linezolid                      S Moxifloxacin                   S         S Nitrofurantoin                 S         S Oxacillin                      R         R Penicillin                     R         R Quinupristin/Dalfopristin      S Rifampin                       S         S Tetracycline                   S         S Trimethoprim/Sulfa             R         S Vancomycin                     S         S     Labs: CBC: Recent Labs  Lab 06/05/22 1505 06/05/22 2044 06/06/22 0556 06/09/22 0558  WBC 2.8* 2.9* 3.0* 2.8*  NEUTROABS 1.8 2.2  --   --   HGB 11.6* 12.2* 11.3* 11.3*  HCT 35.2* 38.4* 34.7* 36.0*  MCV 91.7 93.9 92.3 93.8  PLT 62* 63* 49* 46*   Basic Metabolic Panel: Recent Labs  Lab 06/05/22 1505 06/05/22 2044 06/06/22 0556 06/07/22 0539 06/09/22 0558  NA 136 135 138 135 138  K 4.6 4.8 4.0 4.6 4.3  CL 109 111 112* 114* 115*  CO2 22 20* 20* 20* 19*  GLUCOSE 100* 110* 91 107* 99  BUN 32* 36* 35* 31* 23  CREATININE 3.07* 3.12* 2.82* 2.44* 1.86*  CALCIUM 7.5* 7.7* 7.5* 6.9* 7.1*   Liver Function Tests: Recent Labs  Lab 06/05/22 1505 06/06/22 0556 06/07/22 0539  AST 28 33 31  ALT '21 25 23  '$ ALKPHOS 92 84 85  BILITOT 1.4* 1.7* 1.1  PROT 6.1* 6.1* 5.6*  ALBUMIN 2.7* 2.7* 2.4*   CBG: Recent Labs  Lab  06/06/22 0819 06/07/22 0811 06/08/22 0800 06/09/22 0733 06/09/22 0811  GLUCAP 60* 77 65* 68* 94    Discharge time spent: 35 minutes.  Signed: Cordelia Poche, MD [facility] 06/09/2022

## 2022-06-09 NOTE — Progress Notes (Signed)
Mobility Specialist - Progress Note Pt refused mobility, just ambulate with OT and did not want to again. Will check back in if schedule permits.   Roderick Pee Mobility Specialist

## 2022-06-09 NOTE — Progress Notes (Signed)
Pt discharged to home. Dc instructions given. No concerns verbalized. Pt left unit in wheelchair pushed by this Probation officer. Left in stable condition

## 2022-06-09 NOTE — Evaluation (Signed)
Occupational Therapy Evaluation Patient Details Name: Trevor Murphy MRN: [id number] DOB: 12/15/1934 Today's Date: 06/09/2022   History of Present Illness Trevor Murphy is a 87 y.o. male with medical history significant of  IDA insetting of GI bleed due to PUD, thrombocytopenia, leukopenia,, myelodysplasia, CAD nonobstructive disease, CKDIIIa, Essential hypertension, CVA, Gout ,SVT who presents to ED in referral from heme clinic due to increase in CR from 1.55 to 3. Patient notes no n/v/d/abdominal pain, chest pain, sob , recent hospitalizations  or new medications.  He notes he feels very well. He denies any dark stools or bleeding.   Clinical Impression   [name] required set-up assist for self-feeding and dressing tasks in sitting, and SBA to occasional min guard assist for functional transfers and ambulation using a rolling walker. Intermittent verbal/directional cues and min guard assist were mostly required, given his baseline vision impairment (he is legally blind) and unfamiliarity with his current physical environment.  Overall, he appears to be near his baseline level of functioning for ADL management and hedoes not require further therapy services in the hospital setting. Given his vision impairment, he would benefit from post-hospital OT services specializing in low vision intervention(s).      Recommendations for follow up therapy are one component of a multi-disciplinary discharge planning process, led by the attending physician.  Recommendations may be updated based on patient status, additional functional criteria and insurance authorization.   Follow Up Recommendations  Other (comment). Low vision therapy services in the home vs. outpatient setting   Assistance Recommended at Discharge PRN  Patient can return home with the following Assist for transportation;Assistance with cooking/housework    Functional Status Assessment  Patient has not had a recent decline in their  functional status  Equipment Recommendations  None recommended by OT       Precautions / Restrictions Precautions Precautions: Fall Restrictions Weight Bearing Restrictions: No Other Position/Activity Restrictions: The patient is legally blind      Mobility Bed Mobility   Bed Mobility: Sit to Supine       Sit to supine: Modified independent (Device/Increase time)        Transfers Overall transfer level: Needs assistance Equipment used: Rolling walker (2 wheels) Transfers: Sit to/from Stand Sit to Stand: Supervision           Balance     Sitting balance-Leahy Scale: Good         Standing balance comment: SBA to occasional min guard assist using RW         ADL either performed or assessed with clinical judgement   ADL   Eating/Feeding: Bed level;Set up Eating/Feeding Details (indicate cue type and reason): Required set-up assist, given baseline vision impairment Grooming: Set up;Sitting           Upper Body Dressing : Set up;Sitting   Lower Body Dressing: Set up Lower Body Dressing Details (indicate cue type and reason): for sock management seated in the chair Toilet Transfer: Min guard Toilet Transfer Details (indicate cue type and reason): at bathroom level, based on clinical judgement                 Vision Baseline Vision/History: 2 Legally blind Patient Visual Report: No change from baseline Additional Comments: He reports total blindness in his L eye and blurry vision in his R eye            Pertinent Vitals/Pain Pain Assessment Pain Score: 2  Pain Location: L UE where an IV was  removed     Hand Dominance Right   Extremity/Trunk Assessment Upper Extremity Assessment Upper Extremity Assessment: Overall WFL for tasks assessed   Lower Extremity Assessment Lower Extremity Assessment: Overall WFL for tasks assessed       Communication Communication Communication: No difficulties   Cognition Arousal/Alertness:  Awake/alert Behavior During Therapy: WFL for tasks assessed/performed Overall Cognitive Status: Within Functional Limits for tasks assessed          General Comments: Oriented x4, able to follow 1-2 commands without difficulty                Home Living Family/patient expects to be discharged to:: Private residence Living Arrangements: Alone Available Help at Discharge: Family;Available PRN/intermittently Type of Home: Apartment Home Access: Level entry     Home Layout: One level     Bathroom Shower/Tub: Tub/shower unit         Home Equipment: Merchant navy officer (4 wheels);Toilet riser;Shower seat          Prior Functioning/Environment Prior Level of Function : Independent/Modified Independent             Mobility Comments: He used a rollator for household ambulation and hurrycane for ambulation outside the home. ADLs Comments: He reported being modified independent to independent with ADLs, his family brings him meals daily, and he does not drive.        OT Problem List: Impaired vision/perception       AM-PAC OT "6 Clicks" Daily Activity     Outcome Measure Help from another person eating meals?: None Help from another person taking care of personal grooming?: None Help from another person toileting, which includes using toliet, bedpan, or urinal?: A Little Help from another person bathing (including washing, rinsing, drying)?: A Little Help from another person to put on and taking off regular upper body clothing?: None Help from another person to put on and taking off regular lower body clothing?: None 6 Click Score: 22   End of Session Equipment Utilized During Treatment: Rolling walker (2 wheels) Nurse Communication: Mobility status  Activity Tolerance: Patient tolerated treatment well Patient left: in bed;with call bell/phone within reach;with bed alarm set  OT Visit Diagnosis: Unsteadiness on feet (R26.81)                Time:  7096-2836 OT Time Calculation (min): 22 min Charges:  OT General Charges $OT Visit: 1 Visit OT Evaluation $OT Eval Low Complexity: 1 Low  Axel Meas L Cordarrius Coad, OTR/L 06/09/2022, 12:22 PM

## 2022-06-10 ENCOUNTER — Telehealth: Payer: Self-pay

## 2022-06-10 NOTE — Telephone Encounter (Signed)
Transition Care Management Follow-up Telephone Call Date of discharge and from where: [facility] 06/09/2022 How have you been since you were released from the hospital? good Any questions or concerns? No  Items Reviewed: Did the pt receive and understand the discharge instructions provided? Yes  Medications obtained and verified? Yes  Other? No  Any new allergies since your discharge? No  Dietary orders reviewed? Yes Do you have support at home? Yes   Home Care and Equipment/Supplies: Were home health services ordered? no If so, what is the name of the agency? N/a  Has the agency set up a time to come to the patient's home? not applicable Were any new equipment or medical supplies ordered?  No What is the name of the medical supply agency? N/a Were you able to get the supplies/equipment? no Do you have any questions related to the use of the equipment or supplies? No  Functional Questionnaire: (I = Independent and D = Dependent) ADLs: I  Bathing/Dressing- I  Meal Prep- D  Eating- I  Maintaining continence- I  Transferring/Ambulation- I  Managing Meds- D  Follow up appointments reviewed:  PCP Hospital f/u appt confirmed? No   No appts available. Nurse to schedule appt with Jancece Moore's advise [facility] f/u appt confirmed? No   Are transportation arrangements needed? No  If their condition worsens, is the pt aware to call PCP or go to the Emergency Dept.? Yes Was the patient provided with contact information for the PCP's office or ED? Yes Was to pt encouraged to call back with questions or concerns? Yes  Juanda Crumble, LPN [place] Direct Dial [phone]

## 2022-06-10 NOTE — Telephone Encounter (Signed)
Transition Care Management Follow-up Telephone Call Date of discharge and from where: 06/09/2022 [facility]  How have you been since you were released from the hospital? Patient sister report he is doing well. He is not experiencing any pain at the moment.  Any questions or concerns? No  Items Reviewed: Did the pt receive and understand the discharge instructions provided? Yes  Medications obtained and verified? Yes  Other? No  Any new allergies since your discharge? No  Dietary orders reviewed? Yes Do you have support at home? Yes   Home Care and Equipment/Supplies: Were home health services ordered? no If so, what is the name of the agency? N/a  Has the agency set up a time to come to the patient's home? no Were any new equipment or medical supplies ordered?  No What is the name of the medical supply agency? N/a Were you able to get the supplies/equipment? no Do you have any questions related to the use of the equipment or supplies? No  Functional Questionnaire: (I = Independent and D = Dependent) ADLs: I/d  Bathing/Dressing- I/d  Meal Prep- I/d  Eating- I/d  Maintaining continence- I/d  Transferring/Ambulation- I/d  Managing Meds- I/d  Follow up appointments reviewed:  PCP Hospital f/u appt confirmed? Yes  Scheduled to see janece moore on n/a @ n/a. [facility] f/u appt confirmed? No  Scheduled to see n/a on n/a @ n/a. Are transportation arrangements needed? No  If their condition worsens, is the pt aware to call PCP or go to the Emergency Dept.? Yes Was the patient provided with contact information for the PCP's office or ED? Yes Was to pt encouraged to call back with questions or concerns? Yes

## 2022-06-16 ENCOUNTER — Inpatient Hospital Stay: Admission: RE | Admit: 2022-06-16 | Payer: Medicare Other | Source: Ambulatory Visit

## 2022-06-16 NOTE — Progress Notes (Signed)
Carelink Summary Report / Loop Recorder 

## 2022-06-17 ENCOUNTER — Ambulatory Visit (INDEPENDENT_AMBULATORY_CARE_PROVIDER_SITE_OTHER): Payer: Medicare Other | Admitting: Podiatry

## 2022-06-17 DIAGNOSIS — M79674 Pain in right toe(s): Secondary | ICD-10-CM

## 2022-06-17 DIAGNOSIS — B351 Tinea unguium: Secondary | ICD-10-CM

## 2022-06-17 DIAGNOSIS — I739 Peripheral vascular disease, unspecified: Secondary | ICD-10-CM

## 2022-06-17 DIAGNOSIS — N183 Chronic kidney disease, stage 3 unspecified: Secondary | ICD-10-CM

## 2022-06-17 DIAGNOSIS — M79675 Pain in left toe(s): Secondary | ICD-10-CM

## 2022-06-17 NOTE — Progress Notes (Signed)
This patient returns to my office for at risk foot care.  This patient requires this care by a professional since this patient will be at risk due to having chronic kidney disease and coagulation defect.  Patient is taking eliquis.   This patient is unable to cut nails himself since the patient cannot reach his nails.These nails are painful walking and wearing shoes.  This patient presents for at risk foot care today.  General Appearance  Alert, conversant and in no acute stress.  Vascular  Dorsalis pedis and posterior tibial  pulses are weakly  palpable  bilaterally.  Capillary return is within normal limits  Bilaterally. Cold feet bilaterally. Absent digital hair  B/L.  Neurologic  Senn-Weinstein monofilament wire test within normal limits  bilaterally. Muscle power within normal limits bilaterally.  Nails Thick disfigured discolored nails with subungual debris  from hallux to fifth toes bilaterally. No evidence of bacterial infection or drainage bilaterally.  Orthopedic  No limitations of motion  feet .  No crepitus or effusions noted.  No bony pathology or digital deformities noted.  HAV  B/L.  Skin  normotropic skin with no porokeratosis noted bilaterally.  No signs of infections or ulcers noted.     Onychomycosis  Pain in right toes  Pain in left toes  Consent was obtained for treatment procedures.   Mechanical debridement of nails 1-5  bilaterally performed with a nail nipper.  Filed with dremel without incident.  Cauterized fourth toe left foot.   Return office visit     10  weeks                Told patient to return for periodic foot care and evaluation due to potential at risk complications.   Gardiner Barefoot DPM

## 2022-06-18 ENCOUNTER — Ambulatory Visit (INDEPENDENT_AMBULATORY_CARE_PROVIDER_SITE_OTHER): Payer: Medicare Other | Admitting: Nurse Practitioner

## 2022-06-18 ENCOUNTER — Encounter: Payer: Self-pay | Admitting: Nurse Practitioner

## 2022-06-18 VITALS — BP 138/88 | HR 77 | Temp 98.3°F | Ht 70.0 in | Wt 151.0 lb

## 2022-06-18 DIAGNOSIS — I5032 Chronic diastolic (congestive) heart failure: Secondary | ICD-10-CM

## 2022-06-18 DIAGNOSIS — Z95810 Presence of automatic (implantable) cardiac defibrillator: Secondary | ICD-10-CM | POA: Diagnosis not present

## 2022-06-18 DIAGNOSIS — E860 Dehydration: Secondary | ICD-10-CM | POA: Diagnosis not present

## 2022-06-18 DIAGNOSIS — Z95818 Presence of other cardiac implants and grafts: Secondary | ICD-10-CM | POA: Insufficient documentation

## 2022-06-18 DIAGNOSIS — R310 Gross hematuria: Secondary | ICD-10-CM

## 2022-06-18 DIAGNOSIS — M7989 Other specified soft tissue disorders: Secondary | ICD-10-CM

## 2022-06-18 MED ORDER — DAPAGLIFLOZIN PROPANEDIOL 10 MG PO TABS: 10.0000 mg | ORAL_TABLET | Freq: Every day | ORAL | 2 refills | Status: DC

## 2022-06-18 NOTE — Progress Notes (Signed)
Barnet [place] Martin,acting as a Education administrator for [name], FNP.,have documented all relevant documentation on the behalf of [name], FNP,as directed by  [name], FNP while in the presence of [name], [place].    Subjective:     Patient ID: Trevor Murphy , male    DOB: Feb 06, 1935 , 87 y.o.   MRN: [id number]   Chief Complaint  Patient presents with   Follow-up    ER    HPI  Patient presents today for a hospital follow-up he was admitted 06/05/2022 - 06/09/2022. Patient was very dehydrated, he was administered IV fluids. Patient states he is feeling better but his feet are very swollen per patient and sister   He had been to his Hematology appt and checked his labs, his creatinine was 3.02 and BUN was 32. He was given IV fluids and admitted. His ankles are swollen today and yesterday. Denies shortness of breath. He reports he is feeling "fine".   BP Readings from Last 3 Encounters: 06/18/22 : 138/88 06/09/22 : (!) 156/71 06/05/22 : [name] Kitchen 153/63       Past Medical History:  Diagnosis Date   Arthritis    CAD (coronary artery disease)    a. Cath 08/2009: minimal nonobstructive disease (25% LAD). b. Lexiscan nuc 6/215: EF 54%, no ischemia or infarction.   Chronic diastolic CHF (congestive heart failure) ([place])    a. Echo 2013: EF 55-60%, mild AI, mild MR. b. Echo 5/15 with EF 60-65%, no WMA, mild LVH, mild AI.   CKD (chronic kidney disease) stage 3, GFR 30-59 ml/min (HCC) 07/26/2012   a. Felt likely due to HTN.   CVA (cerebral infarction) 07/28/2012   Glaucoma    Gout    H/O umbilical hernia repair    Headache(784.0)    " WHEN MY BLOOD PRESSURE IS UP"   History of acute renal failure    History of alcohol abuse    HTN (hypertension)    Hypertension    a. Patent renal arteries 2005.   Noncompliance    OA (osteoarthritis)    Other and unspecified hyperlipidemia    Poor circulation    Sinus bradycardia    SVT (supraventricular tachycardia)    a. Possible AVNRT.  Got  adenosine from EMS in 2015 for SVT.  During 5/15 hospitalization had episode of short R-P tachycardia (possible AVNRT) terminated by adenosine.  b. 04/2016 - recurrent tachycardia (SVT vs. AVNRT), again terminated by Adenosine   Visual impairment      Family History  Problem Relation Age of Onset   CVA Mother    Hypertension Brother    Hypertension Sister    Hypertension Brother    Stroke Sister    Heart attack Neg Hx      Current Outpatient Medications:    acetaminophen (TYLENOL) 650 MG CR tablet, Take 650 mg by mouth every 8 (eight) hours as needed for pain., Disp: , Rfl:    apixaban (ELIQUIS) 2.5 MG TABS tablet, TAKE 1 TABLET(2.5 MG) BY MOUTH TWICE DAILY, Disp: 60 tablet, Rfl: 5   atorvastatin (LIPITOR) 80 MG tablet, TAKE 1 TABLET(80 MG) BY MOUTH DAILY AT 6 PM, Disp: 90 tablet, Rfl: 1   dapagliflozin propanediol ([name]) 10 MG TABS tablet, Take 1 tablet (10 mg total) by mouth daily before breakfast., Disp: 30 tablet, Rfl: 2   ferrous sulfate (FEROSUL) 325 (65 FE) MG tablet, TAKE 1 TABLET(325 MG) BY MOUTH DAILY, Disp: 90 tablet, Rfl: 1   finasteride (PROSCAR) 5 MG tablet,  Take 5 mg by mouth daily., Disp: , Rfl:    LUMIGAN 0.01 % SOLN, Place 1 drop into both eyes at bedtime., Disp: , Rfl:    SIMBRINZA 1-0.2 % SUSP, Follow up with your PCP on when to restart this medication., Disp: , Rfl:    No Known Allergies   Review of Systems  Constitutional: Negative.   HENT: Negative.    Eyes: Negative.   Cardiovascular:  Positive for leg swelling (both legs).  Gastrointestinal: Negative.   Genitourinary: Negative.   Musculoskeletal: Negative.   Skin: Negative.   Allergic/Immunologic: Negative.   Neurological: Negative.   Hematological: Negative.      Today's Vitals   06/18/22 1445  BP: 138/88  Pulse: 77  Temp: 98.3 F (36.8 C)  TempSrc: Oral  Weight: 151 lb (68.5 kg)  Height: _0  (1.778 m)  PainSc: 0-No pain   Body mass index is 21.67 kg/m.  Wt Readings from Last 3  Encounters:  06/18/22 151 lb (68.5 kg)  06/08/22 151 lb 7.3 oz (68.7 kg)  06/05/22 139 lb 9 oz (63.3 kg)    Objective:  Physical Exam Vitals reviewed.  Constitutional:      General: He is not in acute distress.    Appearance: Normal appearance.  Cardiovascular:     Rate and Rhythm: Normal rate and regular rhythm.     Pulses: Normal pulses.     Heart sounds: Normal heart sounds. No murmur heard. Pulmonary:     Effort: Pulmonary effort is normal. No respiratory distress.     Breath sounds: Normal breath sounds. No wheezing.  Abdominal:     General: Distension: soft.  Musculoskeletal:     Right lower leg: Edema present.     Left lower leg: Edema present.  Skin:    General: Skin is warm and dry.  Neurological:     General: No focal deficit present.     Mental Status: He is alert and oriented to person, place, and time.     Cranial Nerves: No cranial nerve deficit.     Motor: No weakness.  Psychiatric:        Mood and Affect: Mood normal.        Behavior: Behavior normal.        Thought Content: Thought content normal.        Judgment: Judgment normal.         Assessment And Plan:     1. Dehydration TCM Performed. A member of the clinical team spoke with the patient upon dischare. Discharge summary was reviewed in full detail during the visit. Meds reconciled and compared to discharge meds. Medication list is updated and reviewed with the patient.  Greater than 50% face to face time was spent in counseling an coordination of care.  All questions were answered to the satisfaction of the patient.   Will recheck kidney functions.  Encouraged to increase water intake to at least 1500 ml a day - BMP8+eGFR  2. Chronic diastolic CHF (congestive heart failure) (HCC) Comments: Advised to take his medications regularly. Will start [name] to help with CHF and kidney functions. I will also refer back to Cardiology - dapagliflozin propanediol ([name]) 10 MG TABS tablet; Take 1  tablet (10 mg total) by mouth daily before breakfast.  Dispense: 30 tablet; Refill: 2 - Ambulatory referral to Cardiology  3. Hematuria, gross Comments: Improved since being admitted, he is on finasteride and is to f/u with Urology.  4. Leg swelling Trace edema bilateral feet.  5. Presence of automatic (implantable) cardiac defibrillator     Patient was given opportunity to ask questions. Patient verbalized understanding of the plan and was able to repeat key elements of the plan. All questions were answered to their satisfaction.  [name], FNP   I, [name], FNP, have reviewed all documentation for this visit. The documentation on 06/18/22 for the exam, diagnosis, procedures, and orders are all accurate and complete.   IF YOU HAVE BEEN REFERRED TO A SPECIALIST, IT MAY TAKE 1-2 WEEKS TO SCHEDULE/PROCESS THE REFERRAL. IF YOU HAVE NOT HEARD FROM US/SPECIALIST IN TWO WEEKS, PLEASE GIVE [place] A CALL AT [phone].   THE PATIENT IS ENCOURAGED TO PRACTICE SOCIAL DISTANCING DUE TO THE COVID-19 PANDEMIC.

## 2022-06-18 NOTE — Patient Instructions (Addendum)
Dehydration, Adult Dehydration is condition in which there is not enough water or other fluids in the body. This happens when a person loses more fluids than he or she takes in. Important body parts cannot work right without the right amount of fluids. Any loss of fluids from the body can cause dehydration. Dehydration can be mild, worse, or very bad. It should be treated right away to keep it from getting very bad. What are the causes? This condition may be caused by: Conditions that cause loss of water or other fluids, such as: Watery poop (diarrhea). Vomiting. Sweating a lot. Peeing (urinating) a lot. Not drinking enough fluids, especially when you: Are ill. Are doing things that take a lot of energy to do. Other illnesses and conditions, such as fever or infection. Certain medicines, such as medicines that take extra fluid out of the body (diuretics). Lack of safe drinking water. Not being able to get enough water and food. What increases the risk? The following factors may make you more likely to develop this condition: Having a long-term (chronic) illness that has not been treated the right way, such as: Diabetes. Heart disease. Kidney disease. Being 65 years of age or older. Having a disability. Living in a place that is high above the ground or sea (high in altitude). The thinner, dried air causes more fluid loss. Doing exercises that put stress on your body for a long time. What are the signs or symptoms? Symptoms of dehydration depend on how bad it is. Mild or worse dehydration Thirst. Dry lips or dry mouth. Feeling dizzy or light-headed, especially when you stand up from sitting. Muscle cramps. Your body making: Dark pee (urine). Pee may be the color of tea. Less pee than normal. Less tears than normal. Headache. Very bad dehydration Changes in skin. Skin may: Be cold to the touch (clammy). Be blotchy or pale. Not go back to normal right after you lightly pinch  it and let it go. Little or no tears, pee, or sweat. Changes in vital signs, such as: Fast breathing. Low blood pressure. Weak pulse. Pulse that is more than 100 beats a minute when you are sitting still. Other changes, such as: Feeling very thirsty. Eyes that look hollow (sunken). Cold hands and feet. Being mixed up (confused). Being very tired (lethargic) or having trouble waking from sleep. Short-term weight loss. Loss of consciousness. How is this treated? Treatment for this condition depends on how bad it is. Treatment should start right away. Do not wait until your condition gets very bad. Very bad dehydration is an emergency. You will need to go to a hospital. Mild or worse dehydration can be treated at home. You may be asked to: Drink more fluids. Drink an oral rehydration solution (ORS). This drink helps get the right amounts of fluids and salts and minerals in the blood (electrolytes). Very bad dehydration can be treated: With fluids through an IV tube. By getting normal levels of salts and minerals in your blood. This is often done by giving salts and minerals through a tube. The tube is passed through your nose and into your stomach. By treating the root cause. Follow these instructions at home: Oral rehydration solution If told by your doctor, drink an ORS: Make an ORS. Use instructions on the package. Start by drinking small amounts, about  cup (120 mL) every 5-10 minutes. Slowly drink more until you have had the amount that your doctor said to have. Eating and drinking          Drink enough clear fluid to keep your pee pale yellow. If you were told to drink an ORS, finish the ORS first. Then, start slowly drinking other clear fluids. Drink fluids such as: Water. Do not drink only water. Doing that can make the salt (sodium) level in your body get too low. Water from ice chips you suck on. Fruit juice that you have added water to (diluted). Low-calorie sports  drinks. Eat foods that have the right amounts of salts and minerals, such as: Bananas. Oranges. Potatoes. Tomatoes. Spinach. Do not drink alcohol. Avoid: Drinks that have a lot of sugar. These include: High-calorie sports drinks. Fruit juice that you did not add water to. Soda. Caffeine. Foods that are greasy or have a lot of fat or sugar. General instructions Take over-the-counter and prescription medicines only as told by your doctor. Do not take salt tablets. Doing that can make the salt level in your body get too high. Return to your normal activities as told by your doctor. Ask your doctor what activities are safe for you. Keep all follow-up visits as told by your doctor. This is important. Contact a doctor if: You have pain in your belly (abdomen) and the pain: Gets worse. Stays in one place. You have a rash. You have a stiff neck. You get angry or annoyed (irritable) more easily than normal. You are more tired or have a harder time waking than normal. You feel: Weak or dizzy. Very thirsty. Get help right away if you have: Any symptoms of very bad dehydration. Symptoms of vomiting, such as: You cannot eat or drink without vomiting. Your vomiting gets worse or does not go away. Your vomit has blood or green stuff in it. Symptoms that get worse with treatment. A fever. A very bad headache. Problems with peeing or pooping (having a bowel movement), such as: Watery poop that gets worse or does not go away. Blood in your poop (stool). This may cause poop to look black and tarry. Not peeing in 6-8 hours. Peeing only a small amount of very dark pee in 6-8 hours. Trouble breathing. These symptoms may be an emergency. Do not wait to see if the symptoms will go away. Get medical help right away. Call your local emergency services (911 in the [place] Do not drive yourself to the hospital. Summary Dehydration is a condition in which there is not enough water or other fluids  in the body. This happens when a person loses more fluids than he or she takes in. Treatment for this condition depends on how bad it is. Treatment should be started right away. Do not wait until your condition gets very bad. Drink enough clear fluid to keep your pee pale yellow. If you were told to drink an oral rehydration solution (ORS), finish the ORS first. Then, start slowly drinking other clear fluids. Take over-the-counter and prescription medicines only as told by your doctor. Get help right away if you have any symptoms of very bad dehydration. This information is not intended to replace advice given to you by your health care provider. Make sure you discuss any questions you have with your health care provider. Document Revised: 12/03/2021 Document Reviewed: 03/09/2019 Elsevier Patient Education  2023 Elsevier Inc.  

## 2022-06-19 LAB — BMP8+EGFR
BUN/Creatinine Ratio: 9 — ABNORMAL LOW (ref 10–24)
BUN: 13 mg/dL (ref 8–27)
CO2: 17 mmol/L — ABNORMAL LOW (ref 20–29)
Calcium: 7.6 mg/dL — ABNORMAL LOW (ref 8.6–10.2)
Chloride: 110 mmol/L — ABNORMAL HIGH (ref 96–106)
Creatinine, Ser: 1.44 mg/dL — ABNORMAL HIGH (ref 0.76–1.27)
Glucose: 93 mg/dL (ref 70–99)
Potassium: 4.6 mmol/L (ref 3.5–5.2)
Sodium: 138 mmol/L (ref 134–144)
eGFR: 48 mL/min/{1.73_m2} — ABNORMAL LOW (ref >59)

## 2022-07-01 ENCOUNTER — Ambulatory Visit: Payer: Self-pay

## 2022-07-01 DIAGNOSIS — M7989 Other specified soft tissue disorders: Secondary | ICD-10-CM | POA: Insufficient documentation

## 2022-07-01 NOTE — Patient Instructions (Signed)
Visit Information  Thank you for taking time to visit with me today. Please don't hesitate to contact me if I can be of assistance to you.   Following are the goals we discussed today:   Goals Addressed             This Visit's Progress    I need to schedule a follow up for his A-fib       Care Coordination Interventions: Determined patient received a new Cardiology referral per PCP Reviewed referral process with sister [name], she will contact this RN if she has not heard from the Cardiologist office by 07/18/22     To increase water intake and start [place] Coordination Interventions: Assessed the sister [name] understanding of chronic kidney disease    Evaluation of current treatment plan related to chronic kidney disease self management and patient's adherence to plan as established by provider      Reviewed prescribed diet increase water intake to 48 oz daily  Provided education on kidney disease progression    Reviewed medications with sister [name] including new Rx for [name] Determined sister [name] was advised by the pharmacy, patient did not have Rx on file for this medication Confirmed per chart review, [name] 10 mg tablet was prescribed on 06/18/22 with 2 refills, confirmed pharmacy with sister Educated sister on indication and usage of this medication including the importance of patient staying well hydrated           Our next appointment is by telephone on 07/30/22 at 11:00 AM  Please call the care guide team at [phone] if you need to cancel or reschedule your appointment.   If you are experiencing a Mental Health or [place] or need someone to talk to, please call 1-800-273-TALK (toll free, 24 hour hotline)  The patient verbalized understanding of instructions, educational materials, and care plan provided today and agreed to receive a mailed copy of patient instructions, educational materials, and care plan.   Barb Merino, RN,  BSN, CCM Care Management Coordinator [facility] Care Management  Direct Phone: [phone]

## 2022-07-01 NOTE — Patient Outreach (Signed)
  Care Coordination   Follow Up Visit Note   07/01/2022 Name: BOLDEN HAGERMAN MRN: [id number] DOB: 25-Apr-1935  ADVAIT BUICE is a 87 y.o. year old male who sees [name], [place] for primary care. I spoke with sister Debbe Bales by phone today.  What matters to the patients health and wellness today?  Patient will continue to increase his water intake to 48 oz daily. He will start [place] as directed.     Goals Addressed             This Visit's Progress    I need to schedule a follow up for his A-fib       Care Coordination Interventions: Determined patient received a new Cardiology referral per PCP Reviewed referral process with sister [name], she will contact this RN if she has not heard from the Cardiologist office by 07/18/22     To increase water intake and start [place] Coordination Interventions: Assessed the sister [name] understanding of chronic kidney disease    Evaluation of current treatment plan related to chronic kidney disease self management and patient's adherence to plan as established by provider      Reviewed prescribed diet increase water intake to 48 oz daily  Provided education on kidney disease progression    Reviewed medications with sister [name] including new Rx for [name] Determined sister [name] was advised by the pharmacy, patient did not have Rx on file for this medication Confirmed per chart review, [name] 10 mg tablet was prescribed on 06/18/22 with 2 refills, confirmed pharmacy with sister Educated sister on indication and usage of this medication including the importance of patient staying well hydrated           SDOH assessments and interventions completed:  No     Care Coordination Interventions:  Yes, provided   Follow up plan: Follow up call scheduled for 07/30/22 '@11'$ :00 AM    Encounter Outcome:  Pt. Visit Completed

## 2022-07-06 ENCOUNTER — Ambulatory Visit (INDEPENDENT_AMBULATORY_CARE_PROVIDER_SITE_OTHER): Payer: Medicare Other

## 2022-07-06 ENCOUNTER — Ambulatory Visit: Payer: Medicare Other | Attending: Internal Medicine | Admitting: Internal Medicine

## 2022-07-06 ENCOUNTER — Encounter: Payer: Self-pay | Admitting: Internal Medicine

## 2022-07-06 VITALS — BP 138/80 | HR 78 | Ht 70.0 in | Wt 145.8 lb

## 2022-07-06 DIAGNOSIS — N184 Chronic kidney disease, stage 4 (severe): Secondary | ICD-10-CM | POA: Diagnosis not present

## 2022-07-06 DIAGNOSIS — I639 Cerebral infarction, unspecified: Secondary | ICD-10-CM

## 2022-07-06 DIAGNOSIS — I2510 Atherosclerotic heart disease of native coronary artery without angina pectoris: Secondary | ICD-10-CM | POA: Diagnosis not present

## 2022-07-06 DIAGNOSIS — I10 Essential (primary) hypertension: Secondary | ICD-10-CM | POA: Diagnosis not present

## 2022-07-06 DIAGNOSIS — I5032 Chronic diastolic (congestive) heart failure: Secondary | ICD-10-CM | POA: Diagnosis not present

## 2022-07-06 DIAGNOSIS — I700 Atherosclerosis of aorta: Secondary | ICD-10-CM

## 2022-07-06 DIAGNOSIS — E785 Hyperlipidemia, unspecified: Secondary | ICD-10-CM

## 2022-07-06 DIAGNOSIS — I480 Paroxysmal atrial fibrillation: Secondary | ICD-10-CM

## 2022-07-06 NOTE — Patient Instructions (Signed)
Medication Instructions:  Your physician recommends that you continue on your current medications as directed. Please refer to the Current Medication list given to you today.  *If you need a refill on your cardiac medications before your next appointment, please call your pharmacy*   Lab Work: none If you have labs (blood work) drawn today and your tests are completely normal, you will receive your results only by: [place] (if you have MyChart) OR A paper copy in the mail If you have any lab test that is abnormal or we need to change your treatment, we will call you to review the results.   Testing/Procedures: none   Follow-Up: At [facility], you and your health needs are our priority.  As part of our continuing mission to provide you with exceptional heart care, we have created designated Provider Care Teams.  These Care Teams include your primary Cardiologist (physician) and Advanced Practice Providers (APPs -  Physician Assistants and Nurse Practitioners) who all work together to provide you with the care you need, when you need it.  We recommend signing up for the patient portal called "MyChart".  Sign up information is provided on this After Visit Summary.  MyChart is used to connect with patients for Virtual Visits (Telemedicine).  Patients are able to view lab/test results, encounter notes, upcoming appointments, etc.  Non-urgent messages can be sent to your provider as well.   To learn more about what you can do with MyChart, go to [email or web address].    Your next appointment:   6 month(s)  The format for your next appointment:   In Person  Provider:   Early Osmond, MD     Other Instructions    Important Information About Sugar

## 2022-07-06 NOTE — Progress Notes (Signed)
Cardiology Office Note:    Date:  07/06/2022   ID:  Trevor Murphy, DOB 06/10/1935, MRN [id number]  PCP:  [name], [facility] Cardiologist:  Lenna Sciara, MD Referring MD: [name], FNP   Chief Complaint/Reason for Referral:  Chronic diastolic heart failure  ASSESSMENT:    1. Chronic diastolic CHF (congestive heart failure) ([place])   2. Coronary artery disease involving native coronary artery of native heart without angina pectoris   3. CKD (chronic kidney disease) stage 4, GFR 15-29 ml/min (HCC)   4. Primary hypertension   5. Hyperlipidemia LDL goal <70   6. Cryptogenic stroke ([place])   7. Aortic atherosclerosis ([place])   8. Paroxysmal atrial fibrillation (HCC)     PLAN:    In order of problems listed above: 1.  Chronic diastolic heart failure: We will defer echocardiogram.  I agree with [place].  Patient's kidney function will limit our ability to add other agents.  I had a long conversation regarding the patient's advanced age and goals of care.  I told him and his sister to discuss CODE STATUS and to address with his PCP with plans to qualify this in the chart. 2.  Coronary artery disease: This is mild based on cath in 2011.  Continue Eliquis in lieu of aspirin and statin. 3.  Chronic kidney disease: Continue [name] for now.  Given recent AKI and fluctuating kidney function, would defer ACEi or ARB for renal protection purposes right now (if ever). 4.  Hypertension:  5.  Hyperlipidemia:  Given advanced age I do not think strict lipid control is of great urgency here. 6.  Cryptogenic stroke: Patient does have a PFO but his stroke was sustained at the age of 36 so it is not likely to PFO associated. 7.  Aortic atherosclerosis: Continue Eliquis in lieu of aspirin, continue statin for now, and pursue strict blood pressure control. 8.  Paroxysmal atrial fibrillation: Continue Eliquis.             Dispo:  Return in about 6 months (around 01/04/2023).       Medication Adjustments/Labs and Tests Ordered: Current medicines are reviewed at length with the patient today.  Concerns regarding medicines are outlined above.  The following changes have been made:  no change   Labs/tests ordered: Orders Placed This Encounter  Procedures   EKG 12-Lead    Medication Changes: No orders of the defined types were placed in this encounter.    Current medicines are reviewed at length with the patient today.  The patient does not have concerns regarding medicines.   History of Present Illness:    FOCUSED PROBLEM LIST:   1.  Paroxysmal atrial fibrillation with a CV 2 score of 6 followed by atrial fibrillation clinic with a Linq monitor implanted 2.  Mild coronary artery disease on cardiac catheterization 2011 with a 25% LAD lesion 3.  Chronic kidney disease stage IV 4.  Hypertension 5.  Hyperlipidemia 6.  History of stroke in 2020 7.  Bifascicular block with first-degree and right bundle branch block 8.  Aortic calcifications and atherosclerosis seen on chest CT 2015 9.  SVT in 2015 and 2016 terminated by adenosine 10.  Frailty  The patient is a 87 y.o. male with the indicated medical history here for recommendations regarding chronic diastolic heart failure.  The patient was admitted to the hospital in October due to AKI and was treated with IV fluids.  His lorazepam was stopped at that time.  He was seen by his primary care provider in early November and was doing well.  He was started on [name] at that time.  The patient is here with his sister.  On his own admission he does not do much in a day.  He sometimes gets short of breath but it is not too bothersome to him.  He likes to watch TV most of the day.  He denies any paroxysmal atrial dyspnea, orthopnea, severe falls, or severe bleeding.  He is required no recent emergency room visits or hospitalizations since discharge after treatment for AKI.  He is otherwise well without significant  complaints.        Current Medications: Current Meds  Medication Sig   acetaminophen (TYLENOL) 650 MG CR tablet Take 650 mg by mouth every 8 (eight) hours as needed for pain.   apixaban (ELIQUIS) 2.5 MG TABS tablet TAKE 1 TABLET(2.5 MG) BY MOUTH TWICE DAILY   atorvastatin (LIPITOR) 80 MG tablet TAKE 1 TABLET(80 MG) BY MOUTH DAILY AT 6 PM   dapagliflozin propanediol ([name]) 10 MG TABS tablet Take 1 tablet (10 mg total) by mouth daily before breakfast.   ferrous sulfate (FEROSUL) 325 (65 FE) MG tablet TAKE 1 TABLET(325 MG) BY MOUTH DAILY   finasteride (PROSCAR) 5 MG tablet Take 5 mg by mouth daily.   LUMIGAN 0.01 % SOLN Place 1 drop into both eyes at bedtime.   SIMBRINZA 1-0.2 % SUSP Follow up with your PCP on when to restart this medication.     Allergies:    Patient has no known allergies.   Social History:   Social History   Tobacco Use   Smoking status: Never   Smokeless tobacco: Never  Vaping Use   Vaping Use: Never used  Substance Use Topics   Alcohol use: No    Alcohol/week: 1.0 standard drink of alcohol    Types: 1 Cans of beer per week    Comment: h/o heavy use, quit in 2017   Drug use: No     Family Hx: Family History  Problem Relation Age of Onset   CVA Mother    Hypertension Brother    Hypertension Sister    Hypertension Brother    Stroke Sister    Heart attack Neg Hx      Review of Systems:   Please see the history of present illness.    All other systems reviewed and are negative.     EKGs/Labs/Other Test Reviewed:    EKG:  EKG performed September 2023 that I personally reviewed demonstrates sinus bradycardia with first-degree AV block and right bundle branch block; EKG performed today that I personally reviewed demonstrates sinus rhythm with first-degree AV block, right bundle branch block, and left ventricular hypertrophy.  Prior CV studies:  Coronary angiography 2011 with mild obstructive disease of the LAD of around 25%  TTE 2020  1.  Left ventricular ejection fraction, by visual estimation, is 55 to  60%. The left ventricle has normal function. Normal left ventricular size.  There is severely increased left ventricular hypertrophy.   2. Left ventricular diastolic Doppler parameters are consistent with  impaired relaxation pattern of LV diastolic filling.   3. Global right ventricle has normal systolic function.The right  ventricular size is normal. No increase in right ventricular wall  thickness.   4. Left atrial size was normal.   5. Right atrial size was normal.   6. The mitral valve is normal in structure. Mild mitral valve  regurgitation. No  evidence of mitral stenosis.   7. The tricuspid valve is normal in structure. Tricuspid valve  regurgitation was not visualized by color flow Doppler.   8. The aortic valve is normal in structure. Aortic valve regurgitation is  mild by color flow Doppler. Mild to moderate aortic valve  sclerosis/calcification without any evidence of aortic stenosis.   9. The pulmonic valve was normal in structure. Pulmonic valve  regurgitation is not visualized by color flow Doppler.  10. The inferior vena cava is normal in size with greater than 50%  respiratory variability, suggesting right atrial pressure of 3 mmHg.   Other studies Reviewed: Review of the additional studies/records demonstrates: None relevant  Recent Labs: 06/07/2022: ALT 23 06/09/2022: Hemoglobin 11.3; Platelets 46 06/18/2022: BUN 13; Creatinine, Ser 1.44; Potassium 4.6; Sodium 138   Recent Lipid Panel Lab Results  Component Value Date/Time   CHOL 73 (L) 04/20/2022 11:06 AM   TRIG 47 04/20/2022 11:06 AM   HDL 47 04/20/2022 11:06 AM   LDLCALC 13 04/20/2022 11:06 AM    Risk Assessment/Calculations:     CHA2DS2-VASc Score = 6   This indicates a 9.7% annual risk of stroke. The patient's score is based upon: CHF History: 0 HTN History: 1 Diabetes History: 0 Stroke History: 2 Vascular Disease History: 1 Age  Score: 2 Gender Score: 0              Physical Exam:    VS:  BP 138/80   Pulse 78   Ht '5\' 10"'$  (1.778 m)   Wt 145 lb 12.8 oz (66.1 kg)   SpO2 96%   BMI 20.92 kg/m    Wt Readings from Last 3 Encounters:  07/06/22 145 lb 12.8 oz (66.1 kg)  06/18/22 151 lb (68.5 kg)  06/08/22 151 lb 7.3 oz (68.7 kg)    GENERAL:  No apparent distress, AOx3 HEENT:  No carotid bruits, +2 carotid impulses, no scleral icterus, dry mucous membranes CAR: RRR no murmurs, gallops, rubs, or thrills RES:  Clear to auscultation bilaterally ABD:  Soft, nontender, nondistended, positive bowel sounds x 4 VASC:  +2 radial pulses, +2 carotid pulses, palpable pedal pulses NEURO:  CN 2-12 grossly intact; motor and sensory grossly intact PSYCH:  No active depression or anxiety EXT:  No edema, ecchymosis, or cyanosis  Signed, Early Osmond, MD  07/06/2022 2:49 PM    [address], [place], [place]  [address] Phone: [phone]; Fax: [phone]   Note:  This document was prepared using Dragon voice recognition software and may include unintentional dictation errors.

## 2022-07-07 LAB — CUP PACEART REMOTE DEVICE CHECK
Date Time Interrogation Session: 20231126230623
Implantable Pulse Generator Implant Date: 20201014

## 2022-07-14 ENCOUNTER — Encounter (HOSPITAL_COMMUNITY): Payer: Self-pay

## 2022-07-14 ENCOUNTER — Other Ambulatory Visit: Payer: Self-pay

## 2022-07-14 ENCOUNTER — Emergency Department (HOSPITAL_COMMUNITY)
Admission: EM | Admit: 2022-07-14 | Discharge: 2022-07-14 | Disposition: A | Discharge status: Home / Self Care | Payer: Medicare Other | Attending: Emergency Medicine | Admitting: Emergency Medicine

## 2022-07-14 ENCOUNTER — Emergency Department (HOSPITAL_COMMUNITY): Payer: Medicare Other

## 2022-07-14 ENCOUNTER — Ambulatory Visit
Admission: RE | Admit: 2022-07-14 | Discharge: 2022-07-14 | Disposition: A | Discharge status: Home / Self Care | Payer: Medicare Other | Source: Ambulatory Visit | Attending: Nurse Practitioner | Admitting: Nurse Practitioner

## 2022-07-14 DIAGNOSIS — H65191 Other acute nonsuppurative otitis media, right ear: Secondary | ICD-10-CM | POA: Diagnosis not present

## 2022-07-14 DIAGNOSIS — M47812 Spondylosis without myelopathy or radiculopathy, cervical region: Secondary | ICD-10-CM | POA: Diagnosis not present

## 2022-07-14 DIAGNOSIS — S199XXA Unspecified injury of neck, initial encounter: Secondary | ICD-10-CM | POA: Diagnosis not present

## 2022-07-14 DIAGNOSIS — S0990XA Unspecified injury of head, initial encounter: Secondary | ICD-10-CM | POA: Insufficient documentation

## 2022-07-14 DIAGNOSIS — Z79899 Other long term (current) drug therapy: Secondary | ICD-10-CM | POA: Diagnosis not present

## 2022-07-14 DIAGNOSIS — Z7901 Long term (current) use of anticoagulants: Secondary | ICD-10-CM | POA: Insufficient documentation

## 2022-07-14 DIAGNOSIS — I5032 Chronic diastolic (congestive) heart failure: Secondary | ICD-10-CM | POA: Diagnosis not present

## 2022-07-14 DIAGNOSIS — I2510 Atherosclerotic heart disease of native coronary artery without angina pectoris: Secondary | ICD-10-CM | POA: Diagnosis not present

## 2022-07-14 DIAGNOSIS — I959 Hypotension, unspecified: Secondary | ICD-10-CM | POA: Diagnosis not present

## 2022-07-14 DIAGNOSIS — Z9229 Personal history of other drug therapy: Secondary | ICD-10-CM

## 2022-07-14 DIAGNOSIS — W19XXXA Unspecified fall, initial encounter: Secondary | ICD-10-CM

## 2022-07-14 DIAGNOSIS — N183 Chronic kidney disease, stage 3 unspecified: Secondary | ICD-10-CM | POA: Insufficient documentation

## 2022-07-14 DIAGNOSIS — Z8673 Personal history of transient ischemic attack (TIA), and cerebral infarction without residual deficits: Secondary | ICD-10-CM | POA: Diagnosis not present

## 2022-07-14 DIAGNOSIS — M4316 Spondylolisthesis, lumbar region: Secondary | ICD-10-CM | POA: Diagnosis not present

## 2022-07-14 DIAGNOSIS — G319 Degenerative disease of nervous system, unspecified: Secondary | ICD-10-CM | POA: Diagnosis not present

## 2022-07-14 DIAGNOSIS — K862 Cyst of pancreas: Secondary | ICD-10-CM | POA: Diagnosis not present

## 2022-07-14 DIAGNOSIS — I130 Hypertensive heart and chronic kidney disease with heart failure and stage 1 through stage 4 chronic kidney disease, or unspecified chronic kidney disease: Secondary | ICD-10-CM | POA: Insufficient documentation

## 2022-07-14 DIAGNOSIS — R1900 Intra-abdominal and pelvic swelling, mass and lump, unspecified site: Secondary | ICD-10-CM

## 2022-07-14 DIAGNOSIS — K7689 Other specified diseases of liver: Secondary | ICD-10-CM | POA: Diagnosis not present

## 2022-07-14 DIAGNOSIS — I480 Paroxysmal atrial fibrillation: Secondary | ICD-10-CM | POA: Diagnosis not present

## 2022-07-14 DIAGNOSIS — Z043 Encounter for examination and observation following other accident: Secondary | ICD-10-CM | POA: Diagnosis not present

## 2022-07-14 MED ORDER — AMOXICILLIN-POT CLAVULANATE 875-125 MG PO TABS: 1.0000 | ORAL_TABLET | Freq: Two times a day (BID) | ORAL | 0 refills | Status: AC

## 2022-07-14 MED ORDER — GADOPICLENOL 0.5 MMOL/ML IV SOLN
6.0000 mL | Freq: Once | INTRAVENOUS | Status: AC | PRN
  Administered 2022-07-14: 6 mL via INTRAVENOUS

## 2022-07-14 NOTE — ED Provider Notes (Signed)
[facility] EMERGENCY DEPARTMENT Provider Note   CSN: [id number] Arrival date & time: 07/14/22  1844     History {Add pertinent medical, surgical, social history, OB history to HPI:1} Chief Complaint  Patient presents with   Level 2    Fall on thinners    Trevor Murphy is a 87 y.o. male.  87 year old male with a history of CAD, CHF, pAF on Eliquis who presents to the emergency department with fall.  Patient was going to use the restroom and says that when he went to sit down he missed the seat and hit the floor with his bottom.  Denies any head strike or LOC.  No preceding chest pain or shortness of breath or dizziness.  Per EMS they are unsure if he hit his head or not.    Past Medical History:  Diagnosis Date   Arthritis    CAD (coronary artery disease)    a. Cath 08/2009: minimal nonobstructive disease (25% LAD). b. Lexiscan nuc 6/215: EF 54%, no ischemia or infarction.   Chronic diastolic CHF (congestive heart failure) ([place])    a. Echo 2013: EF 55-60%, mild AI, mild MR. b. Echo 5/15 with EF 60-65%, no WMA, mild LVH, mild AI.   CKD (chronic kidney disease) stage 3, GFR 30-59 ml/min (HCC) 07/26/2012   a. Felt likely due to HTN.   CVA (cerebral infarction) 07/28/2012   Glaucoma    Gout    H/O umbilical hernia repair    Headache(784.0)    " WHEN MY BLOOD PRESSURE IS UP"   History of acute renal failure    History of alcohol abuse    HTN (hypertension)    Hypertension    a. Patent renal arteries 2005.   Noncompliance    OA (osteoarthritis)    Other and unspecified hyperlipidemia    Poor circulation    Sinus bradycardia    SVT (supraventricular tachycardia)    a. Possible AVNRT.  Got adenosine from EMS in 2015 for SVT.  During 5/15 hospitalization had episode of short R-P tachycardia (possible AVNRT) terminated by adenosine.  b. 04/2016 - recurrent tachycardia (SVT vs. AVNRT), again terminated by Adenosine   Visual impairment         Home  Medications Prior to Admission medications   Medication Sig Start Date End Date Taking? Authorizing Provider  acetaminophen (TYLENOL) 650 MG CR tablet Take 650 mg by mouth every 8 (eight) hours as needed for pain.    [provider]  apixaban (ELIQUIS) 2.5 MG TABS tablet TAKE 1 TABLET(2.5 MG) BY MOUTH TWICE DAILY 04/27/22   Fenton, Clint R, PA  atorvastatin (LIPITOR) 80 MG tablet TAKE 1 TABLET(80 MG) BY MOUTH DAILY AT 6 PM 01/09/22   [name], FNP  dapagliflozin propanediol ([name]) 10 MG TABS tablet Take 1 tablet (10 mg total) by mouth daily before breakfast. 06/18/22   [name], FNP  ferrous sulfate (FEROSUL) 325 (65 FE) MG tablet TAKE 1 TABLET(325 MG) BY MOUTH DAILY 10/13/21   [name], FNP  finasteride (PROSCAR) 5 MG tablet Take 5 mg by mouth daily. 03/09/22   [provider]  LUMIGAN 0.01 % SOLN Place 1 drop into both eyes at bedtime. 08/28/19   [provider]  SIMBRINZA 1-0.2 % SUSP Follow up with your PCP on when to restart this medication. 06/09/22   Mariel Aloe, MD      Allergies    Patient has no known allergies.    Review of Systems  Review of Systems  Physical Exam Updated Vital Signs Ht '5\' 10"'$  (1.778 m)   Wt 65.8 kg   BMI 20.81 kg/m  Physical Exam  ED Results / Procedures / Treatments   Labs (all labs ordered are listed, but only abnormal results are displayed) Labs Reviewed - No data to display  EKG None  Radiology No results found.  Procedures Procedures  {Document cardiac monitor, telemetry assessment procedure when appropriate:1}  Medications Ordered in ED Medications - No data to display  ED Course/ Medical Decision Making/ A&P                           Medical Decision Making  ***  {Document critical care time when appropriate:1} {Document review of labs and clinical decision tools ie heart score, Chads2Vasc2 etc:1}  {Document your independent review of radiology images, and any outside  records:1} {Document your discussion with family members, caretakers, and with consultants:1} {Document social determinants of health affecting pt's care:1} {Document your decision making why or why not admission, treatments were needed:1} Final Clinical Impression(s) / ED Diagnoses Final diagnoses:  None    Rx / DC Orders ED Discharge Orders     None

## 2022-07-14 NOTE — Progress Notes (Signed)
Chaplain responded to level 2, fall on blood thinners.  Patient not available to chaplain.  Please call if needed. Rev. Tamsen Snider Pager [phone]

## 2022-07-14 NOTE — Progress Notes (Signed)
Orthopedic Tech Progress Note Patient Details:  Trevor Murphy 1935/05/02 [id number]  Level 2 trauma   Patient ID: Trevor Murphy, male   DOB: 1935-03-25, 87 y.o.   MRN: [id number]  Trevor Murphy 07/14/2022, 6:58 PM

## 2022-07-14 NOTE — Discharge Instructions (Signed)
Today you were seen in the emergency department for your fall.  You had a CT scan and chest x-rays that did not show any injuries.  At home please use Tylenol as needed for any pain that you may have.  Follow-up with your primary doctor in 2 to 3 days.  Return to the emergency department immediately if you experience any of the following: Severe headache, vomiting, or any other concerning symptoms.

## 2022-07-14 NOTE — ED Triage Notes (Signed)
Pt BIB EMS from [place] having a scan and was given xanax before scan(unknown of how much). Pt went to bathroom and had unwitnessed fall and takes eliquis. Pt unsure if he hit his head. Pt has hx of dementia.

## 2022-07-15 ENCOUNTER — Telehealth: Payer: Self-pay

## 2022-07-15 NOTE — Telephone Encounter (Signed)
Transition Care Management Follow-up Telephone Call Date of discharge and from where: 07/14/2022  How have you been since you were released from the hospital? Pt sister states he reports being sore.   Any questions or concerns? No  Items Reviewed: Did the pt receive and understand the discharge instructions provided? Yes  Medications obtained and verified? Yes  Other? Yes  Any new allergies since your discharge? No  Dietary orders reviewed? Yes Do you have support at home? Yes   Home Care and Equipment/Supplies: Were home health services ordered? no If so, what is the name of the agency? N/a  Has the agency set up a time to come to the patient's home? no Were any new equipment or medical supplies ordered?  No What is the name of the medical supply agency? N/a Were you able to get the supplies/equipment? no Do you have any questions related to the use of the equipment or supplies? No  Functional Questionnaire: (I = Independent and D = Dependent) ADLs: I/d  Bathing/Dressing- I/d  Meal Prep- I/d  Eating- I/d  Maintaining continence- I/d  Transferring/Ambulation- I/d  Managing Meds- I/d  Follow up appointments reviewed:  PCP Hospital f/u appt confirmed? Yes  Scheduled to see janece moore  on n/a @ n/a. [facility] f/u appt confirmed? No  Scheduled to see n/a on n/a @ n/a. Are transportation arrangements needed? No  If their condition worsens, is the pt aware to call PCP or go to the Emergency Dept.? Yes Was the patient provided with contact information for the PCP's office or ED? Yes Was to pt encouraged to call back with questions or concerns? Yes

## 2022-07-30 ENCOUNTER — Ambulatory Visit: Payer: Self-pay

## 2022-07-30 NOTE — Patient Instructions (Signed)
Visit Information  Thank you for taking time to visit with me today. Please don't hesitate to contact me if I can be of assistance to you.   Following are the goals we discussed today:   Goals Addressed               This Visit's Progress     Patient Stated     To start daily weights (pt-stated)        Care Coordination Interventions: Basic overview and discussion of pathophysiology of Heart Failure reviewed Reviewed Heart Failure Action Plan in depth and provided written copy Assessed need for readable accurate scales in home Provided education about placing scale on hard, flat surface Advised patient to weigh each morning after emptying bladder Discussed importance of daily weight and advised patient to weigh and record daily Discussed the importance of keeping all appointments with provider         Other     COMPLETED: I need to schedule a follow up for his A-fib        Care Coordination Interventions: Determined patient completed a Cardiology follow up with Dr. Ali Lowe on 07/06/22 Review of patient status, including review of consultant's reports, relevant laboratory and other test results, and medications completed      To reduce number of falls        Care Coordination Interventions: Assessed for falls since last encounter Assessed patients knowledge of fall risk prevention secondary to previously provided education           Our next appointment is by telephone on 08/25/22 at 09:30 AM  Please call the care guide team at [phone] if you need to cancel or reschedule your appointment.   If you are experiencing a Mental Health or [place] or need someone to talk to, please call 1-800-273-TALK (toll free, 24 hour hotline)  The patient verbalized understanding of instructions, educational materials, and care plan provided today and agreed to receive a mailed copy of patient instructions, educational materials, and care plan.   Barb Merino,  RN, BSN, CCM Care Management Coordinator [facility] Care Management  Direct Phone: [phone]

## 2022-07-30 NOTE — Patient Outreach (Signed)
  Care Coordination   Follow Up Visit Note   07/30/2022 Name: Trevor Murphy MRN: [id number] DOB: 06-23-1935  Trevor Murphy is a 87 y.o. year old male who sees [name], [facility] for primary care. I spoke with [name] by phone today.  What matters to the patients health and wellness today?  Patient will start weighing himself daily.     Goals Addressed               This Visit's Progress     Patient Stated     To start daily weights (pt-stated)        Care Coordination Interventions: Basic overview and discussion of pathophysiology of Heart Failure reviewed Reviewed Heart Failure Action Plan in depth and provided written copy Assessed need for readable accurate scales in home Provided education about placing scale on hard, flat surface Advised patient to weigh each morning after emptying bladder Discussed importance of daily weight and advised patient to weigh and record daily Discussed the importance of keeping all appointments with provider         Other     COMPLETED: I need to schedule a follow up for his A-fib        Care Coordination Interventions: Determined patient completed a Cardiology follow up with Dr. Ali Lowe on 07/06/22 Review of patient status, including review of consultant's reports, relevant laboratory and other test results, and medications completed      To reduce number of falls        Care Coordination Interventions: Assessed for falls since last encounter Assessed patients knowledge of fall risk prevention secondary to previously provided education           SDOH assessments and interventions completed:  No     Care Coordination Interventions:  Yes, provided   Follow up plan: Follow up call scheduled for 08/25/22 '@09'$ :30 AM    Encounter Outcome:  Pt. Visit Completed

## 2022-08-06 ENCOUNTER — Encounter: Payer: Self-pay | Admitting: Nurse Practitioner

## 2022-08-06 ENCOUNTER — Ambulatory Visit: Payer: Medicare Other | Admitting: Nurse Practitioner

## 2022-08-06 ENCOUNTER — Telehealth (INDEPENDENT_AMBULATORY_CARE_PROVIDER_SITE_OTHER): Payer: Medicare Other | Admitting: Nurse Practitioner

## 2022-08-06 DIAGNOSIS — N183 Chronic kidney disease, stage 3 unspecified: Secondary | ICD-10-CM | POA: Diagnosis not present

## 2022-08-06 DIAGNOSIS — I5042 Chronic combined systolic (congestive) and diastolic (congestive) heart failure: Secondary | ICD-10-CM | POA: Diagnosis not present

## 2022-08-06 DIAGNOSIS — R7303 Prediabetes: Secondary | ICD-10-CM | POA: Diagnosis not present

## 2022-08-06 DIAGNOSIS — E782 Mixed hyperlipidemia: Secondary | ICD-10-CM | POA: Diagnosis not present

## 2022-08-06 DIAGNOSIS — R188 Other ascites: Secondary | ICD-10-CM | POA: Diagnosis not present

## 2022-08-06 DIAGNOSIS — I130 Hypertensive heart and chronic kidney disease with heart failure and stage 1 through stage 4 chronic kidney disease, or unspecified chronic kidney disease: Secondary | ICD-10-CM

## 2022-08-06 NOTE — Patient Instructions (Signed)

## 2022-08-06 NOTE — Progress Notes (Signed)
Virtual Visit via Telephone    This visit type was conducted due to national recommendations for restrictions regarding the COVID-19 Pandemic (e.g. social distancing) in an effort to limit this patient's exposure and mitigate transmission in our community.  Due to his co-morbid illnesses, this patient is at least at moderate risk for complications without adequate follow up.  This format is felt to be most appropriate for this patient at this time.  All issues noted in this document were discussed and addressed.  A limited physical exam was performed with this format.    This visit type was conducted due to national recommendations for restrictions regarding the COVID-19 Pandemic (e.g. social distancing) in an effort to limit this patient's exposure and mitigate transmission in our community.  Patients identity confirmed using two different identifiers.  This format is felt to be most appropriate for this patient at this time.  All issues noted in this document were discussed and addressed.  No physical exam was performed (except for noted visual exam findings with Video Visits).    Date:  08/09/2022   ID:  Trevor Murphy, DOB [birth year], MRN [id number]  Patient Location:  Home-spoke with Trevor Murphy and Trevor Murphy  Provider location:   Office    Chief Complaint:  discuss results of MRI  History of Present Illness:    NICHALAS COIN is a 87 y.o. male who presents via video conferencing for a telehealth visit today.    The patient does not have symptoms concerning for COVID-19 infection (fever, chills, cough, or new shortness of breath).   Pt presents today for [name] '10MG'$  follow up. Reports compliance with meds.  His sister reports him having a fall recently. Having to visit ED on 07/16/22. CT scans completed on the same day.   They have no specific questions or concerns at the moment.      Past Medical History:  Diagnosis Date   Arthritis    CAD (coronary artery disease)     a. Cath 08/2009: minimal nonobstructive disease (25% LAD). b. Lexiscan nuc 6/215: EF 54%, no ischemia or infarction.   Chronic diastolic CHF (congestive heart failure) ([place])    a. Echo 2013: EF 55-60%, mild AI, mild MR. b. Echo 5/15 with EF 60-65%, no WMA, mild LVH, mild AI.   CKD (chronic kidney disease) stage 3, GFR 30-59 ml/min (HCC) 07/26/2012   a. Felt likely due to HTN.   CVA (cerebral infarction) 07/28/2012   Glaucoma    Gout    H/O umbilical hernia repair    Headache(784.0)    " WHEN MY BLOOD PRESSURE IS UP"   History of acute renal failure    History of alcohol abuse    HTN (hypertension)    Hypertension    a. Patent renal arteries 2005.   Noncompliance    OA (osteoarthritis)    Other and unspecified hyperlipidemia    Poor circulation    Sinus bradycardia    SVT (supraventricular tachycardia)    a. Possible AVNRT.  Got adenosine from EMS in 2015 for SVT.  During 5/15 hospitalization had episode of short R-P tachycardia (possible AVNRT) terminated by adenosine.  b. 04/2016 - recurrent tachycardia (SVT vs. AVNRT), again terminated by Adenosine   Visual impairment    Past Surgical History:  Procedure Laterality Date   APPENDECTOMY     BIOPSY  08/12/2020   Procedure: BIOPSY;  Surgeon: Juanita Craver, MD;  Location: [facility] ENDOSCOPY;  Service: Endoscopy;;   BUBBLE STUDY  05/24/2019   Procedure: BUBBLE STUDY;  Surgeon: Donato Heinz, MD;  Location: [facility] ENDOSCOPY;  Service: Endoscopy;;   CARDIAC CATHETERIZATION  11/2003   EF 50-55%   CARDIAC CATHETERIZATION  08/2009   CATARACT EXTRACTION EXTRACAPSULAR Right 05/17/2013   Procedure: CATARACT EXTRACTION EXTRACAPSULAR WITH INTRAOCULAR LENS PLACEMENT (False Pass) RIGHT EYE;  Surgeon: Marylynn Pearson, MD;  Location: [place];  Service: Ophthalmology;  Laterality: Right;   COLONOSCOPY     ESOPHAGOGASTRODUODENOSCOPY (EGD) WITH PROPOFOL N/A 08/12/2020   Procedure: ESOPHAGOGASTRODUODENOSCOPY (EGD) WITH PROPOFOL;  Surgeon: Juanita Craver, MD;  Location: [facility]  ENDOSCOPY;  Service: Endoscopy;  Laterality: N/A;   HERNIA REPAIR     LOOP RECORDER INSERTION N/A 05/24/2019   Procedure: LOOP RECORDER INSERTION;  Surgeon: Constance Haw, MD;  Location: [place] CV LAB;  Service: Cardiovascular;  Laterality: N/A;   TEE WITHOUT CARDIOVERSION N/A 05/24/2019   Procedure: TRANSESOPHAGEAL ECHOCARDIOGRAM (TEE);  Surgeon: Donato Heinz, MD;  Location: [facility] ENDOSCOPY;  Service: Endoscopy;  Laterality: N/A;   VENTRAL HERNIA REPAIR  03/07/2012   Procedure: LAPAROSCOPIC VENTRAL HERNIA;  Surgeon: Gwenyth Ober, MD;  Location: [place];  Service: General;  Laterality: N/A;     Current Meds  Medication Sig   acetaminophen (TYLENOL) 650 MG CR tablet Take 650 mg by mouth every 8 (eight) hours as needed for pain.   apixaban (ELIQUIS) 2.5 MG TABS tablet TAKE 1 TABLET(2.5 MG) BY MOUTH TWICE DAILY   atorvastatin (LIPITOR) 80 MG tablet TAKE 1 TABLET(80 MG) BY MOUTH DAILY AT 6 PM   dapagliflozin propanediol ([name]) 10 MG TABS tablet Take 1 tablet (10 mg total) by mouth daily before breakfast.   ferrous sulfate (FEROSUL) 325 (65 FE) MG tablet TAKE 1 TABLET(325 MG) BY MOUTH DAILY   finasteride (PROSCAR) 5 MG tablet Take 5 mg by mouth daily.   LUMIGAN 0.01 % SOLN Place 1 drop into both eyes at bedtime.   SIMBRINZA 1-0.2 % SUSP Follow up with your PCP on when to restart this medication.     Allergies:   Patient has no known allergies.   Social History   Tobacco Use   Smoking status: Never   Smokeless tobacco: Never  Vaping Use   Vaping Use: Never used  Substance Use Topics   Alcohol use: No    Alcohol/week: 1.0 standard drink of alcohol    Types: 1 Cans of beer per week    Comment: h/o heavy use, quit in 2017   Drug use: No     Family Hx: The patient's family history includes CVA in his mother; Hypertension in his brother, brother, and sister; Stroke in his sister. There is no history of Heart attack.  ROS:   Please see the history of present  illness.    Review of Systems  Constitutional: Negative.   HENT: Negative.    Respiratory: Negative.    Cardiovascular: Negative.   Gastrointestinal: Negative.   Neurological: Negative.   Endo/Heme/Allergies: Negative.   Psychiatric/Behavioral: Negative.      All other systems reviewed and are negative.   Labs/Other Tests and Data Reviewed:    Recent Labs: 06/07/2022: ALT 23 06/09/2022: Hemoglobin 11.3; Platelets 46 06/18/2022: BUN 13; Creatinine, Ser 1.44; Potassium 4.6; Sodium 138   Recent Lipid Panel Lab Results  Component Value Date/Time   CHOL 73 (L) 04/20/2022 11:06 AM   TRIG 47 04/20/2022 11:06 AM   HDL 47 04/20/2022 11:06 AM   CHOLHDL 1.6 04/20/2022 11:06 AM   CHOLHDL 3.9 05/21/2019 04:22 AM  [place] 13 04/20/2022 11:06 AM    Wt Readings from Last 3 Encounters:  07/14/22 145 lb (65.8 kg)  07/06/22 145 lb 12.8 oz (66.1 kg)  06/18/22 151 lb (68.5 kg)     Exam:    Vital Signs:  There were no vitals taken for this visit.    Physical Exam Constitutional:      General: He is not in acute distress.    Appearance: Normal appearance.  Pulmonary:     Effort: Pulmonary effort is normal. No respiratory distress.     Comments: He was able to speak in complete sentences Neurological:     General: No focal deficit present.     Mental Status: He is alert and oriented to person, place, and time. Mental status is at baseline.     Cranial Nerves: No cranial nerve deficit.  Psychiatric:        Mood and Affect: Mood and affect normal.        Behavior: Behavior normal.        Thought Content: Thought content normal.        Cognition and Memory: Memory normal.        Judgment: Judgment normal.     ASSESSMENT & PLAN:    Prediabetes - Continue focusing on healthy diet  Hypertensive heart disease with chronic combined systolic and diastolic congestive heart failure ([place]) - he is doing well with [place]  Mixed hyperlipidemia - Continue statin, tolerating  well.  Stage 3 chronic kidney disease, unspecified whether stage 3a or 3b CKD (HCC)  Other ascites - Seen on MRI of abdomen, will refer to GI for further evaluation. MRI showed some signs of cirrhosis. - Plan: Ambulatory referral to Gastroenterology   COVID-19 Education: The signs and symptoms of COVID-19 were discussed with the patient and how to seek care for testing (follow up with PCP or arrange E-visit).  The importance of social distancing was discussed today.  Patient Risk:   After full review of this patients clinical status, I feel that they are at least moderate risk at this time.  Time:   Today, I have spent 12 minutes/ seconds with the patient with telehealth technology discussing above diagnoses.     Medication Adjustments/Labs and Tests Ordered: Current medicines are reviewed at length with the patient today.  Concerns regarding medicines are outlined above.   Tests Ordered: Orders Placed This Encounter  Procedures   Ambulatory referral to Gastroenterology    Medication Changes: No orders of the defined types were placed in this encounter.   Disposition:  Follow up prn  Signed, [name], FNP

## 2022-08-09 ENCOUNTER — Encounter: Payer: Self-pay | Admitting: Nurse Practitioner

## 2022-08-11 ENCOUNTER — Ambulatory Visit (INDEPENDENT_AMBULATORY_CARE_PROVIDER_SITE_OTHER): Payer: 59

## 2022-08-11 DIAGNOSIS — I639 Cerebral infarction, unspecified: Secondary | ICD-10-CM

## 2022-08-11 LAB — CUP PACEART REMOTE DEVICE CHECK
Date Time Interrogation Session: 20240101230501
Implantable Pulse Generator Implant Date: 20201014

## 2022-08-18 NOTE — Progress Notes (Signed)
Carelink Summary Report / Loop Recorder 

## 2022-08-25 ENCOUNTER — Ambulatory Visit: Payer: Self-pay

## 2022-08-25 NOTE — Patient Outreach (Signed)
  Care Coordination   Follow Up Visit Note   08/25/2022 Name: FAYE SANFILIPPO MRN: [id number] DOB: 08/06/1935  ALEKZANDER CARDELL is a 88 y.o. year old male who sees [name], [place] for primary care. I spoke with  [name] by phone today.  What matters to the patients health and wellness today?  Patient would like to have an emergency alert system put into place. He will continue to check his daily weights to help monitor his CHF.     Goals Addressed               This Visit's Progress     Patient Stated     To start daily weights (pt-stated)        Care Coordination Interventions: Provided education on low sodium diet Reviewed Heart Failure Action Plan in depth and provided written copy Discussed importance of daily weight and advised patient to weigh and record daily Determined the patient's weights are stable at this time  Reviewed medications with patient and discussed importance of medication adherence         Other     To reduce number of falls        Care Coordination Interventions: Assessed for falls since last encounter Assessed patients knowledge of fall risk prevention secondary to previously provided education Provided patient information for fall alert systems SW referral sent to assist with initiation of emergency alert system Reviewed scheduled/upcoming provider appointment including: next PCP follow up appointment scheduled for 09/24/22 '@10'$ :20 AM, AWV/11:00 AM w/PCP           SDOH assessments and interventions completed:  No     Care Coordination Interventions:  Yes, provided   Follow up plan: Referral made to SW referral sent to [place] to assist with emergency alert system  Follow up call scheduled for 09/28/22 '@12'$  PM    Encounter Outcome:  Pt. Visit Completed

## 2022-08-25 NOTE — Patient Instructions (Signed)
Visit Information  Thank you for taking time to visit with me today. Please don't hesitate to contact me if I can be of assistance to you.   Following are the goals we discussed today:   Goals Addressed               This Visit's Progress     Patient Stated     To start daily weights (pt-stated)        Care Coordination Interventions: Provided education on low sodium diet Reviewed Heart Failure Action Plan in depth and provided written copy Discussed importance of daily weight and advised patient to weigh and record daily Determined the patient's weights are stable at this time  Reviewed medications with patient and discussed importance of medication adherence         Other     To reduce number of falls        Care Coordination Interventions: Assessed for falls since last encounter Assessed patients knowledge of fall risk prevention secondary to previously provided education Provided patient information for fall alert systems SW referral sent to assist with initiation of emergency alert system Reviewed scheduled/upcoming provider appointment including: next PCP follow up appointment scheduled for 09/24/22 '@10'$ :20 AM, AWV/11:00 AM w/PCP           Our next appointment is by telephone on 09/28/22 at 12 PM  Please call the care guide team at [phone] if you need to cancel or reschedule your appointment.   If you are experiencing a Mental Health or [place] or need someone to talk to, please go to [facility] [place] [phone])  The patient verbalized understanding of instructions, educational materials, and care plan provided today and agreed to receive a mailed copy of patient instructions, educational materials, and care plan.   Barb Merino, RN, BSN, CCM Care Management Coordinator [facility] Care Management  Direct Phone: [phone]

## 2022-08-28 ENCOUNTER — Telehealth: Payer: Self-pay

## 2022-08-28 NOTE — Patient Outreach (Signed)
  Care Coordination   08/28/2022 Name: Trevor Murphy MRN: [id number] DOB: 03/30/1935   Care Coordination Outreach Attempts:  An unsuccessful telephone outreach was attempted today to offer the patient information about available care coordination services as a benefit of their health plan.   Follow Up Plan:  Additional outreach attempts will be made to offer the patient care coordination information and services.   Encounter Outcome:  No Answer   Care Coordination Interventions:  No, not indicated     [name], BSW, CDP Social Worker, Certified Dementia Practitioner [place] [facility]  Care Coordination [phone]

## 2022-09-01 ENCOUNTER — Ambulatory Visit: Payer: Self-pay

## 2022-09-01 NOTE — Patient Instructions (Signed)
Visit Information  Thank you for taking time to visit with me today. Please don't hesitate to contact me if I can be of assistance to you.   Following are the goals we discussed today:   Goals Addressed             This Visit's Progress    Care Coordination Activities       Care Coordination Interventions: Collaboration with [place] who requests SW assistance with obtaining an emergency alert system for the patient Performed chart review to note patient has [facility] DSNP plan Spoke with patients sister and caregiver [name] to advise a personal emergency response system may be covered under patients health plan Scheduled follow up call on 1/26 at 3:00 pm to assist [name] in contacting patients health plan         Our next appointment is by telephone on 1/26 at 3:00  Please call the care guide team at [phone] if you need to cancel or reschedule your appointment.   If you are experiencing a Mental Health or [place] or need someone to talk to, please call 911  The patient verbalized understanding of instructions, educational materials, and care plan provided today and DECLINED offer to receive copy of patient instructions, educational materials, and care plan.   Telephone follow up appointment with care management team member scheduled for:1/26  [name], BSW, CDP Social Worker, Certified Dementia Practitioner [place] [facility]  Care Coordination [phone]

## 2022-09-01 NOTE — Patient Outreach (Signed)
  Care Coordination   Follow Up Visit Note   09/01/2022 Name: Trevor Murphy MRN: [id number] DOB: 1935-07-27  Trevor Murphy is a 88 y.o. year old male who sees [name], [place] for primary care. I  spoke with patients [name] by phone.  What matters to the patients health and wellness today?  Obtain a life alert    Goals Addressed             This Visit's Progress    Care Coordination Activities       Care Coordination Interventions: Collaboration with RN Care Manager Ila who requests SW assistance with obtaining an emergency alert system for the patient Performed chart review to note patient has [facility] DSNP plan Spoke with patients sister and caregiver Trevor Murphy to advise a personal emergency response system may be covered under patients health plan Scheduled follow up call on 1/26 at 3:00 pm to assist Trevor Murphy in contacting patients health plan         SDOH assessments and interventions completed:  No     Care Coordination Interventions:  Yes, provided   Follow up plan: Follow up call scheduled for 1/26    Encounter Outcome:  Pt. Visit Completed   Trevor Murphy, Arita Miss, CDP Social Worker, Certified Dementia Practitioner [place] [facility]  Care Coordination [phone]

## 2022-09-04 ENCOUNTER — Ambulatory Visit: Payer: Self-pay

## 2022-09-04 NOTE — Patient Outreach (Signed)
  Care Coordination   Follow Up Visit Note   09/04/2022 Name: Trevor Murphy MRN: [id number] DOB: 1935/06/10  Trevor Murphy is a 88 y.o. year old male who sees [name], [place] for primary care. I  spoke with patients sister and caregiver [name] by phone  What matters to the patients health and wellness today?  Order a personal emergency response system    Goals Addressed             This Visit's Progress    Care Coordination Activities       Care Coordination Interventions: Joint call placed to [facility] to order a personal emergency response system on behalf of the patient; due to the fact that patient was not present with sister we were unable to complete order Attempted to reschedule call; [name] declined stating when she is with patient she will help him order a device and will contact SW if she is met with barriers Provided [name] with the number to [employer] who contracts with [place] advising to have patients health plan ID when she places the call Discussed plan for SW to follow up over the next two weeks to confirm there were no barriers with placing the order for a personal emergency response system (PERS)         SDOH assessments and interventions completed:  No     Care Coordination Interventions:  Yes, provided   Follow up plan: Follow up call scheduled for 09/16/22    Encounter Outcome:  Pt. Visit Completed   [name], Arita Miss, CDP Social Worker, Certified Dementia Practitioner [place] [facility]  Care Coordination [phone]

## 2022-09-04 NOTE — Patient Instructions (Signed)
Visit Information  Thank you for taking time to visit with me today. Please don't hesitate to contact me if I can be of assistance to you.   Following are the goals we discussed today:   Goals Addressed             This Visit's Progress    Care Coordination Activities       Care Coordination Interventions: Joint call placed to [facility] to order a personal emergency response system on behalf of the patient; due to the fact that patient was not present with sister we were unable to complete order Attempted to reschedule call; [name] declined stating when she is with patient she will help him order a device and will contact SW if she is met with barriers Provided [name] with the number to [employer] who contracts with [place] advising to have patients health plan ID when she places the call Discussed plan for SW to follow up over the next two weeks to confirm there were no barriers with placing the order for a personal emergency response system (PERS)         Our next appointment is by telephone on 09/16/22 at 12:30 pm  Please call the care guide team at [phone] if you need to cancel or reschedule your appointment.   If you are experiencing a Mental Health or [place] or need someone to talk to, please call 911  The patient verbalized understanding of instructions, educational materials, and care plan provided today and DECLINED offer to receive copy of patient instructions, educational materials, and care plan.   Telephone follow up appointment with care management team member scheduled for:09/16/22  [name], Arita Miss, CDP Social Worker, Certified Dementia Practitioner [facility] Care Management  Care Coordination [phone]

## 2022-09-13 LAB — CUP PACEART REMOTE DEVICE CHECK
Date Time Interrogation Session: 20240203230417
Implantable Pulse Generator Implant Date: 20201014

## 2022-09-14 ENCOUNTER — Ambulatory Visit: Payer: 59

## 2022-09-14 DIAGNOSIS — I639 Cerebral infarction, unspecified: Secondary | ICD-10-CM | POA: Diagnosis not present

## 2022-09-15 NOTE — Progress Notes (Signed)
Carelink Summary Report / Loop Recorder 

## 2022-09-16 ENCOUNTER — Telehealth: Payer: Self-pay

## 2022-09-16 NOTE — Patient Outreach (Signed)
  Care Coordination   09/16/2022 Name: Trevor Murphy MRN: [id number] DOB: Dec 16, 1934   Care Coordination Outreach Attempts:  An unsuccessful telephone outreach was attempted for a scheduled appointment today. SW attempted to contact patients sister to confirm life alert was ordered.  Follow Up Plan:  Additional outreach attempts will be made to offer the patient care coordination information and services.   Encounter Outcome:  No Answer   Care Coordination Interventions:  No, not indicated    [name], BSW, CDP Social Worker, Certified Dementia Practitioner [place] [facility]  Care Coordination [phone]

## 2022-09-18 ENCOUNTER — Telehealth: Payer: Self-pay | Admitting: Hematology and Oncology

## 2022-09-18 NOTE — Telephone Encounter (Signed)
Per [place] schedule on 4/30 rescheduled pt ; left voicemail to confirm change or reschedule

## 2022-09-22 ENCOUNTER — Encounter: Payer: Self-pay | Admitting: Podiatry

## 2022-09-22 ENCOUNTER — Telehealth: Payer: Self-pay

## 2022-09-22 ENCOUNTER — Ambulatory Visit (INDEPENDENT_AMBULATORY_CARE_PROVIDER_SITE_OTHER): Payer: 59 | Admitting: Podiatry

## 2022-09-22 DIAGNOSIS — M79675 Pain in left toe(s): Secondary | ICD-10-CM

## 2022-09-22 DIAGNOSIS — M79674 Pain in right toe(s): Secondary | ICD-10-CM | POA: Diagnosis not present

## 2022-09-22 DIAGNOSIS — B351 Tinea unguium: Secondary | ICD-10-CM

## 2022-09-22 DIAGNOSIS — I739 Peripheral vascular disease, unspecified: Secondary | ICD-10-CM

## 2022-09-22 NOTE — Progress Notes (Signed)
This patient returns to my office for at risk foot care.  This patient requires this care by a professional since this patient will be at risk due to having chronic kidney disease and coagulation defect.  Patient is taking eliquis.   This patient is unable to cut nails himself since the patient cannot reach his nails.These nails are painful walking and wearing shoes.  He presents to the office with his sister.  This patient presents for at risk foot care today.  General Appearance  Alert, conversant and in no acute stress.  Vascular  Dorsalis pedis and posterior tibial  pulses are weakly  palpable  bilaterally.  Capillary return is within normal limits  Bilaterally. Cold feet bilaterally. Absent digital hair  B/L.  Neurologic  Senn-Weinstein monofilament wire test within normal limits  bilaterally. Muscle power within normal limits bilaterally.  Nails Thick disfigured discolored nails with subungual debris  from hallux to fifth toes bilaterally. No evidence of bacterial infection or drainage bilaterally.  Orthopedic  No limitations of motion  feet .  No crepitus or effusions noted.  No bony pathology or digital deformities noted.  HAV  B/L.  Skin  normotropic skin with no porokeratosis noted bilaterally.  No signs of infections or ulcers noted.     Onychomycosis  Pain in right toes  Pain in left toes  Consent was obtained for treatment procedures.   Mechanical debridement of nails 1-5  bilaterally performed with a nail nipper.  Filed with dremel without incident.  Cauterized fourth toe left foot.   Return office visit     10  weeks                Told patient to return for periodic foot care and evaluation due to potential at risk complications.   Gardiner Barefoot DPM

## 2022-09-22 NOTE — Patient Outreach (Signed)
  Care Coordination   09/22/2022 Name: Trevor Murphy MRN: [id number] DOB: 1934/10/13   Care Coordination Outreach Attempts:  A second unsuccessful outreach was attempted today to offer the patient with information about available care coordination services as a benefit of their health plan.     Follow Up Plan:  Additional outreach attempts will be made to offer the patient care coordination information and services.   Encounter Outcome:  No Answer   Care Coordination Interventions:  No, not indicated    [name], BSW, CDP Social Worker, Certified Dementia Practitioner [place] [facility]  Care Coordination [phone]

## 2022-09-23 ENCOUNTER — Encounter: Payer: Self-pay | Admitting: Nurse Practitioner

## 2022-09-23 ENCOUNTER — Inpatient Hospital Stay (HOSPITAL_COMMUNITY)
Admission: EM | Admit: 2022-09-23 | Discharge: 2022-09-28 | DRG: 433 | Disposition: A | Discharge status: Home Health | Payer: 59 | Attending: Internal Medicine | Admitting: Internal Medicine

## 2022-09-23 ENCOUNTER — Emergency Department (HOSPITAL_COMMUNITY): Payer: 59

## 2022-09-23 ENCOUNTER — Ambulatory Visit (INDEPENDENT_AMBULATORY_CARE_PROVIDER_SITE_OTHER): Payer: 59 | Admitting: Nurse Practitioner

## 2022-09-23 VITALS — BP 124/82 | Temp 97.7°F | Ht 70.0 in

## 2022-09-23 DIAGNOSIS — K449 Diaphragmatic hernia without obstruction or gangrene: Secondary | ICD-10-CM | POA: Diagnosis not present

## 2022-09-23 DIAGNOSIS — I110 Hypertensive heart disease with heart failure: Secondary | ICD-10-CM

## 2022-09-23 DIAGNOSIS — I10 Essential (primary) hypertension: Secondary | ICD-10-CM | POA: Diagnosis not present

## 2022-09-23 DIAGNOSIS — Q2112 Patent foramen ovale: Secondary | ICD-10-CM | POA: Diagnosis not present

## 2022-09-23 DIAGNOSIS — I2510 Atherosclerotic heart disease of native coronary artery without angina pectoris: Secondary | ICD-10-CM | POA: Diagnosis present

## 2022-09-23 DIAGNOSIS — I480 Paroxysmal atrial fibrillation: Secondary | ICD-10-CM | POA: Diagnosis not present

## 2022-09-23 DIAGNOSIS — Z823 Family history of stroke: Secondary | ICD-10-CM

## 2022-09-23 DIAGNOSIS — M5136 Other intervertebral disc degeneration, lumbar region: Secondary | ICD-10-CM | POA: Diagnosis present

## 2022-09-23 DIAGNOSIS — Z7901 Long term (current) use of anticoagulants: Secondary | ICD-10-CM

## 2022-09-23 DIAGNOSIS — R531 Weakness: Secondary | ICD-10-CM | POA: Insufficient documentation

## 2022-09-23 DIAGNOSIS — Y92009 Unspecified place in unspecified non-institutional (private) residence as the place of occurrence of the external cause: Secondary | ICD-10-CM

## 2022-09-23 DIAGNOSIS — R188 Other ascites: Secondary | ICD-10-CM | POA: Diagnosis present

## 2022-09-23 DIAGNOSIS — Z66 Do not resuscitate: Secondary | ICD-10-CM | POA: Diagnosis not present

## 2022-09-23 DIAGNOSIS — D61818 Other pancytopenia: Secondary | ICD-10-CM | POA: Diagnosis present

## 2022-09-23 DIAGNOSIS — W19XXXA Unspecified fall, initial encounter: Secondary | ICD-10-CM | POA: Diagnosis present

## 2022-09-23 DIAGNOSIS — N289 Disorder of kidney and ureter, unspecified: Secondary | ICD-10-CM | POA: Diagnosis not present

## 2022-09-23 DIAGNOSIS — N1832 Chronic kidney disease, stage 3b: Secondary | ICD-10-CM | POA: Diagnosis not present

## 2022-09-23 DIAGNOSIS — I5032 Chronic diastolic (congestive) heart failure: Secondary | ICD-10-CM | POA: Diagnosis present

## 2022-09-23 DIAGNOSIS — R1909 Other intra-abdominal and pelvic swelling, mass and lump: Secondary | ICD-10-CM

## 2022-09-23 DIAGNOSIS — Z8249 Family history of ischemic heart disease and other diseases of the circulatory system: Secondary | ICD-10-CM

## 2022-09-23 DIAGNOSIS — K439 Ventral hernia without obstruction or gangrene: Secondary | ICD-10-CM | POA: Diagnosis not present

## 2022-09-23 DIAGNOSIS — I4510 Unspecified right bundle-branch block: Secondary | ICD-10-CM | POA: Diagnosis present

## 2022-09-23 DIAGNOSIS — Z79899 Other long term (current) drug therapy: Secondary | ICD-10-CM

## 2022-09-23 DIAGNOSIS — Z6820 Body mass index (BMI) 20.0-20.9, adult: Secondary | ICD-10-CM

## 2022-09-23 DIAGNOSIS — K766 Portal hypertension: Secondary | ICD-10-CM | POA: Diagnosis present

## 2022-09-23 DIAGNOSIS — Z515 Encounter for palliative care: Secondary | ICD-10-CM

## 2022-09-23 DIAGNOSIS — Z961 Presence of intraocular lens: Secondary | ICD-10-CM | POA: Diagnosis present

## 2022-09-23 DIAGNOSIS — M6284 Sarcopenia: Secondary | ICD-10-CM | POA: Diagnosis present

## 2022-09-23 DIAGNOSIS — I509 Heart failure, unspecified: Secondary | ICD-10-CM | POA: Diagnosis not present

## 2022-09-23 DIAGNOSIS — I700 Atherosclerosis of aorta: Secondary | ICD-10-CM | POA: Diagnosis present

## 2022-09-23 DIAGNOSIS — D649 Anemia, unspecified: Secondary | ICD-10-CM | POA: Insufficient documentation

## 2022-09-23 DIAGNOSIS — I130 Hypertensive heart and chronic kidney disease with heart failure and stage 1 through stage 4 chronic kidney disease, or unspecified chronic kidney disease: Secondary | ICD-10-CM | POA: Diagnosis not present

## 2022-09-23 DIAGNOSIS — I083 Combined rheumatic disorders of mitral, aortic and tricuspid valves: Secondary | ICD-10-CM | POA: Diagnosis present

## 2022-09-23 DIAGNOSIS — H409 Unspecified glaucoma: Secondary | ICD-10-CM | POA: Diagnosis not present

## 2022-09-23 DIAGNOSIS — R198 Other specified symptoms and signs involving the digestive system and abdomen: Secondary | ICD-10-CM | POA: Insufficient documentation

## 2022-09-23 DIAGNOSIS — K7460 Unspecified cirrhosis of liver: Secondary | ICD-10-CM | POA: Diagnosis not present

## 2022-09-23 DIAGNOSIS — R600 Localized edema: Secondary | ICD-10-CM | POA: Diagnosis not present

## 2022-09-23 DIAGNOSIS — K7031 Alcoholic cirrhosis of liver with ascites: Principal | ICD-10-CM | POA: Diagnosis present

## 2022-09-23 DIAGNOSIS — I8510 Secondary esophageal varices without bleeding: Secondary | ICD-10-CM | POA: Diagnosis not present

## 2022-09-23 DIAGNOSIS — E782 Mixed hyperlipidemia: Secondary | ICD-10-CM | POA: Diagnosis not present

## 2022-09-23 DIAGNOSIS — M25461 Effusion, right knee: Secondary | ICD-10-CM | POA: Diagnosis present

## 2022-09-23 DIAGNOSIS — K4090 Unilateral inguinal hernia, without obstruction or gangrene, not specified as recurrent: Secondary | ICD-10-CM | POA: Diagnosis present

## 2022-09-23 DIAGNOSIS — N183 Chronic kidney disease, stage 3 unspecified: Secondary | ICD-10-CM | POA: Diagnosis present

## 2022-09-23 DIAGNOSIS — N271 Small kidney, bilateral: Secondary | ICD-10-CM | POA: Diagnosis not present

## 2022-09-23 DIAGNOSIS — Z8711 Personal history of peptic ulcer disease: Secondary | ICD-10-CM

## 2022-09-23 DIAGNOSIS — M25561 Pain in right knee: Secondary | ICD-10-CM | POA: Diagnosis present

## 2022-09-23 DIAGNOSIS — K429 Umbilical hernia without obstruction or gangrene: Secondary | ICD-10-CM | POA: Diagnosis not present

## 2022-09-23 DIAGNOSIS — R627 Adult failure to thrive: Secondary | ICD-10-CM | POA: Diagnosis not present

## 2022-09-23 DIAGNOSIS — R7989 Other specified abnormal findings of blood chemistry: Secondary | ICD-10-CM | POA: Diagnosis present

## 2022-09-23 DIAGNOSIS — K862 Cyst of pancreas: Secondary | ICD-10-CM | POA: Diagnosis not present

## 2022-09-23 DIAGNOSIS — Z9841 Cataract extraction status, right eye: Secondary | ICD-10-CM

## 2022-09-23 DIAGNOSIS — Z8673 Personal history of transient ischemic attack (TIA), and cerebral infarction without residual deficits: Secondary | ICD-10-CM

## 2022-09-23 DIAGNOSIS — M47816 Spondylosis without myelopathy or radiculopathy, lumbar region: Secondary | ICD-10-CM | POA: Diagnosis present

## 2022-09-23 HISTORY — DX: Weakness: R53.1

## 2022-09-23 HISTORY — DX: Other ascites: R18.8

## 2022-09-23 HISTORY — DX: Diaphragmatic hernia without obstruction or gangrene: K44.9

## 2022-09-23 LAB — URINALYSIS, ROUTINE W REFLEX MICROSCOPIC
Bilirubin Urine: NEGATIVE
Glucose, UA: >=500 mg/dL — AB
Ketones, ur: 5 mg/dL — AB
Leukocytes,Ua: NEGATIVE
Nitrite: NEGATIVE
Protein, ur: 100 mg/dL — AB
Specific Gravity, Urine: 1.025 (ref 1.005–1.030)
pH: 6 (ref 5.0–8.0)

## 2022-09-23 LAB — COMPREHENSIVE METABOLIC PANEL
ALT: 43 U/L (ref 0–44)
AST: 52 U/L — ABNORMAL HIGH (ref 15–41)
Albumin: 2.4 g/dL — ABNORMAL LOW (ref 3.5–5.0)
Alkaline Phosphatase: 125 U/L (ref 38–126)
Anion gap: 12 (ref 5–15)
BUN: 15 mg/dL (ref 8–23)
CO2: 13 mmol/L — ABNORMAL LOW (ref 22–32)
Calcium: 7.8 mg/dL — ABNORMAL LOW (ref 8.9–10.3)
Chloride: 114 mmol/L — ABNORMAL HIGH (ref 98–111)
Creatinine, Ser: 1.65 mg/dL — ABNORMAL HIGH (ref 0.61–1.24)
GFR, Estimated: 40 mL/min — ABNORMAL LOW (ref >60)
Glucose, Bld: 99 mg/dL (ref 70–99)
Potassium: 3.7 mmol/L (ref 3.5–5.1)
Sodium: 139 mmol/L (ref 135–145)
Total Bilirubin: 1.6 mg/dL — ABNORMAL HIGH (ref 0.3–1.2)
Total Protein: 6.7 g/dL (ref 6.5–8.1)

## 2022-09-23 LAB — I-STAT VENOUS BLOOD GAS, ED
Acid-base deficit: 5 mmol/L — ABNORMAL HIGH (ref 0.0–2.0)
Bicarbonate: 18.9 mmol/L — ABNORMAL LOW (ref 20.0–28.0)
Calcium, Ion: 0.99 mmol/L — ABNORMAL LOW (ref 1.15–1.40)
HCT: 36 % — ABNORMAL LOW (ref 39.0–52.0)
Hemoglobin: 12.2 g/dL — ABNORMAL LOW (ref 13.0–17.0)
O2 Saturation: 96 %
Potassium: 4.8 mmol/L (ref 3.5–5.1)
Sodium: 141 mmol/L (ref 135–145)
TCO2: 20 mmol/L — ABNORMAL LOW (ref 22–32)
pCO2, Ven: 32.4 mmHg — ABNORMAL LOW (ref 44–60)
pH, Ven: 7.374 (ref 7.25–7.43)
pO2, Ven: 79 mmHg — ABNORMAL HIGH (ref 32–45)

## 2022-09-23 LAB — CBC
HCT: 42.3 % (ref 39.0–52.0)
Hemoglobin: 12.9 g/dL — ABNORMAL LOW (ref 13.0–17.0)
MCH: 29.9 pg (ref 26.0–34.0)
MCHC: 30.5 g/dL (ref 30.0–36.0)
MCV: 98.1 fL (ref 80.0–100.0)
Platelets: 88 10*3/uL — ABNORMAL LOW (ref 150–400)
RBC: 4.31 MIL/uL (ref 4.22–5.81)
RDW: 16.4 % — ABNORMAL HIGH (ref 11.5–15.5)
WBC: 4.1 10*3/uL (ref 4.0–10.5)
nRBC: 0 % (ref 0.0–0.2)

## 2022-09-23 LAB — BRAIN NATRIURETIC PEPTIDE: B Natriuretic Peptide: 246 pg/mL — ABNORMAL HIGH (ref 0.0–100.0)

## 2022-09-23 LAB — TROPONIN I (HIGH SENSITIVITY): Troponin I (High Sensitivity): 26 ng/L — ABNORMAL HIGH (ref <18)

## 2022-09-23 MED ORDER — FERROUS SULFATE 325 (65 FE) MG PO TABS
325.0000 mg | ORAL_TABLET | Freq: Every day | ORAL | Status: DC
  Administered 2022-09-24 – 2022-09-28 (×5): 325 mg via ORAL
  Filled 2022-09-23 (×5): qty 1

## 2022-09-23 MED ORDER — LATANOPROST 0.005 % OP SOLN
1.0000 [drp] | Freq: Every day | OPHTHALMIC | Status: DC
  Administered 2022-09-23 – 2022-09-27 (×5): 1 [drp] via OPHTHALMIC
  Filled 2022-09-23: qty 2.5

## 2022-09-23 MED ORDER — DAPAGLIFLOZIN PROPANEDIOL 10 MG PO TABS
10.0000 mg | ORAL_TABLET | Freq: Every day | ORAL | Status: DC
  Administered 2022-09-24 – 2022-09-28 (×5): 10 mg via ORAL
  Filled 2022-09-23 (×5): qty 1

## 2022-09-23 MED ORDER — FINASTERIDE 5 MG PO TABS
5.0000 mg | ORAL_TABLET | Freq: Every day | ORAL | Status: DC
  Administered 2022-09-24 – 2022-09-28 (×5): 5 mg via ORAL
  Filled 2022-09-23 (×5): qty 1

## 2022-09-23 MED ORDER — ONDANSETRON HCL 4 MG PO TABS: 4.0000 mg | ORAL_TABLET | Freq: Four times a day (QID) | ORAL | Status: DC | PRN

## 2022-09-23 MED ORDER — ONDANSETRON HCL 4 MG/2ML IJ SOLN: 4.0000 mg | Freq: Four times a day (QID) | INTRAMUSCULAR | Status: DC | PRN

## 2022-09-23 MED ORDER — FUROSEMIDE 10 MG/ML IJ SOLN
20.0000 mg | Freq: Every day | INTRAMUSCULAR | Status: DC
  Administered 2022-09-24: 20 mg via INTRAVENOUS
  Filled 2022-09-23: qty 2

## 2022-09-23 MED ORDER — SPIRONOLACTONE 25 MG PO TABS
50.0000 mg | ORAL_TABLET | Freq: Every day | ORAL | Status: DC
  Administered 2022-09-23 – 2022-09-25 (×3): 50 mg via ORAL
  Filled 2022-09-23: qty 4
  Filled 2022-09-23 (×2): qty 2

## 2022-09-23 MED ORDER — FUROSEMIDE 10 MG/ML IJ SOLN
20.0000 mg | Freq: Once | INTRAMUSCULAR | Status: AC
  Administered 2022-09-23: 20 mg via INTRAVENOUS
  Filled 2022-09-23: qty 2

## 2022-09-23 MED ORDER — ATORVASTATIN CALCIUM 80 MG PO TABS
80.0000 mg | ORAL_TABLET | Freq: Every day | ORAL | Status: DC
  Administered 2022-09-23 – 2022-09-25 (×3): 80 mg via ORAL
  Filled 2022-09-23: qty 1
  Filled 2022-09-23: qty 2
  Filled 2022-09-23: qty 1

## 2022-09-23 MED ORDER — BRINZOLAMIDE 1 % OP SUSP
1.0000 [drp] | Freq: Three times a day (TID) | OPHTHALMIC | Status: DC
  Administered 2022-09-23 – 2022-09-28 (×13): 1 [drp] via OPHTHALMIC
  Filled 2022-09-23: qty 10

## 2022-09-23 MED ORDER — BRIMONIDINE TARTRATE 0.2 % OP SOLN
1.0000 [drp] | Freq: Three times a day (TID) | OPHTHALMIC | Status: DC
  Administered 2022-09-23 – 2022-09-28 (×13): 1 [drp] via OPHTHALMIC
  Filled 2022-09-23: qty 5

## 2022-09-23 NOTE — Assessment & Plan Note (Signed)
Reducible hernia. Follow up with gen surg as outpt.

## 2022-09-23 NOTE — Assessment & Plan Note (Addendum)
-   discontinue statin in setting of decompensated cirrhosis and likely no further mortality benefit at this time

## 2022-09-23 NOTE — H&P (Signed)
History and Physical    Patient: Trevor Murphy [id number] DOB: 1935/04/17 DOA: 09/23/2022 DOS: the patient was seen and examined on 09/23/2022 PCP: [name], [place]  Patient coming from: Home  Chief Complaint:  Chief Complaint  Patient presents with   Leg Swelling   HPI: Trevor Murphy is a 88 y.o. male with medical history significant of CKD 3, HTN, dCHF, PFO, Cryptogenic stroke, ILR -> PAF on eliquis, h/o heavy EtOH abuse, quit in 2017.  Pt with BLE and abd swelling.  Progressively worse over past 2-3 weeks.  Looks like this may have been ongoing for somewhat longer though.  Not on diuretic at baseline.  No SOB, CP, dizziness, N/V.  Some intermittent abd pain and L inguinal and umbilical hernia.  These ongoing for some time.    Review of Systems: As mentioned in the history of present illness. All other systems reviewed and are negative. Past Medical History:  Diagnosis Date   Arthritis    CAD (coronary artery disease)    a. Cath 08/2009: minimal nonobstructive disease (25% LAD). b. Lexiscan nuc 6/215: EF 54%, no ischemia or infarction.   Chronic diastolic CHF (congestive heart failure) ([place])    a. Echo 2013: EF 55-60%, mild AI, mild MR. b. Echo 5/15 with EF 60-65%, no WMA, mild LVH, mild AI.   CKD (chronic kidney disease) stage 3, GFR 30-59 ml/min (HCC) 07/26/2012   a. Felt likely due to HTN.   CVA (cerebral infarction) 07/28/2012   Glaucoma    Gout    H/O umbilical hernia repair    Headache(784.0)    " WHEN MY BLOOD PRESSURE IS UP"   History of acute renal failure    History of alcohol abuse    HTN (hypertension)    Hypertension    a. Patent renal arteries 2005.   Noncompliance    OA (osteoarthritis)    Other and unspecified hyperlipidemia    Poor circulation    Sinus bradycardia    SVT (supraventricular tachycardia)    a. Possible AVNRT.  Got adenosine from EMS in 2015 for SVT.  During 5/15 hospitalization had episode of short R-P tachycardia (possible AVNRT)  terminated by adenosine.  b. 04/2016 - recurrent tachycardia (SVT vs. AVNRT), again terminated by Adenosine   Visual impairment    Past Surgical History:  Procedure Laterality Date   APPENDECTOMY     BIOPSY  08/12/2020   Procedure: BIOPSY;  Surgeon: Juanita Craver, MD;  Location: [facility] ENDOSCOPY;  Service: Endoscopy;;   BUBBLE STUDY  05/24/2019   Procedure: BUBBLE STUDY;  Surgeon: Donato Heinz, MD;  Location: [facility] ENDOSCOPY;  Service: Endoscopy;;   CARDIAC CATHETERIZATION  11/2003   EF 50-55%   CARDIAC CATHETERIZATION  08/2009   CATARACT EXTRACTION EXTRACAPSULAR Right 05/17/2013   Procedure: CATARACT EXTRACTION EXTRACAPSULAR WITH INTRAOCULAR LENS PLACEMENT ([place]) RIGHT EYE;  Surgeon: Marylynn Pearson, MD;  Location: [place];  Service: Ophthalmology;  Laterality: Right;   COLONOSCOPY     ESOPHAGOGASTRODUODENOSCOPY (EGD) WITH PROPOFOL N/A 08/12/2020   Procedure: ESOPHAGOGASTRODUODENOSCOPY (EGD) WITH PROPOFOL;  Surgeon: Juanita Craver, MD;  Location: [facility] ENDOSCOPY;  Service: Endoscopy;  Laterality: N/A;   HERNIA REPAIR     LOOP RECORDER INSERTION N/A 05/24/2019   Procedure: LOOP RECORDER INSERTION;  Surgeon: Constance Haw, MD;  Location: [place] CV LAB;  Service: Cardiovascular;  Laterality: N/A;   TEE WITHOUT CARDIOVERSION N/A 05/24/2019   Procedure: TRANSESOPHAGEAL ECHOCARDIOGRAM (TEE);  Surgeon: Donato Heinz, MD;  Location[facility] ENDOSCOPY;  Service: Endoscopy;  Laterality: N/A;   VENTRAL HERNIA REPAIR  03/07/2012   Procedure: LAPAROSCOPIC VENTRAL HERNIA;  Surgeon: Gwenyth Ober, MD;  Location: [place];  Service: General;  Laterality: N/A;   Social History:  reports that he has never smoked. He has never used smokeless tobacco. He reports that he does not drink alcohol and does not use drugs.  No Known Allergies  Family History  Problem Relation Age of Onset   CVA Mother    Hypertension Brother    Hypertension Sister    Hypertension Brother    Stroke Sister    Heart attack Neg  Hx     Prior to Admission medications   Medication Sig Start Date End Date Taking? Authorizing Provider  acetaminophen (TYLENOL) 500 MG tablet Take 1,000 mg by mouth 2 (two) times daily as needed for mild pain or moderate pain (leg pain).   Yes [provider]  apixaban (ELIQUIS) 2.5 MG TABS tablet TAKE 1 TABLET(2.5 MG) BY MOUTH TWICE DAILY Patient taking differently: Take 2.5 mg by mouth 2 (two) times daily. 04/27/22  Yes Fenton, Clint R, PA  atorvastatin (LIPITOR) 80 MG tablet TAKE 1 TABLET(80 MG) BY MOUTH DAILY AT 6 PM Patient taking differently: Take 80 mg by mouth daily at 6 PM. 01/09/22  Yes [name], FNP  dapagliflozin propanediol ([name]) 10 MG TABS tablet Take 1 tablet (10 mg total) by mouth daily before breakfast. 06/18/22  Yes [name], FNP  ferrous sulfate (FEROSUL) 325 (65 FE) MG tablet TAKE 1 TABLET(325 MG) BY MOUTH DAILY Patient taking differently: Take 325 mg by mouth daily. 10/13/21  Yes [name], FNP  finasteride (PROSCAR) 5 MG tablet Take 5 mg by mouth daily. 03/09/22  Yes [provider]  LUMIGAN 0.01 % SOLN Place 1 drop into both eyes at bedtime. 08/28/19  Yes [provider]  [place] 1-0.2 % SUSP Follow up with your PCP on when to restart this medication. Patient taking differently: Place 1 drop into both eyes 3 (three) times daily. 06/09/22  Yes Mariel Aloe, MD    Physical Exam: Vitals:   09/23/22 1645 09/23/22 1700 09/23/22 1845 09/23/22 1859  BP: (!) 163/83 (!) 171/88 (!) 153/88   Pulse: 68 68 66   Resp: 13 12 14   $ Temp:    (!) 94.9 F (34.9 C)  TempSrc:      SpO2: 100% 99% 100%    Constitutional: NAD, calm, comfortable Respiratory: clear to auscultation bilaterally, no wheezing, no crackles. Normal respiratory effort. No accessory muscle use.  Cardiovascular: Regular rate and rhythm, no murmurs / rubs / gallops. No extremity edema. 2+ pedal pulses. No carotid bruits.  Abdomen: Distended with ascites Neurologic: CN  2-12 grossly intact. Sensation intact, DTR normal. Strength 5/5 in all 4.  Psychiatric: Normal judgment and insight. Alert and oriented x 3. Normal mood.   Data Reviewed:       Latest Ref Rng & Units 09/23/2022    8:26 PM 09/23/2022   10:56 AM 06/18/2022    3:45 PM  CMP  Glucose 70 - 99 mg/dL  99  93   BUN 8 - 23 mg/dL  15  13   Creatinine 0.61 - 1.24 mg/dL  1.65  1.44   Sodium 135 - 145 mmol/L 141  139  138   Potassium 3.5 - 5.1 mmol/L 4.8  3.7  4.6   Chloride 98 - 111 mmol/L  114  110   CO2 22 - 32 mmol/L  13  17   Calcium 8.9 - 10.3 mg/dL  7.8  7.6   Total Protein 6.5 - 8.1 g/dL  6.7    Total Bilirubin 0.3 - 1.2 mg/dL  1.6    Alkaline Phos 38 - 126 U/L  125    AST 15 - 41 U/L  52    ALT 0 - 44 U/L  43     CBC    Component Value Date/Time   WBC 4.1 09/23/2022 1056   RBC 4.31 09/23/2022 1056   HGB 12.2 (L) 09/23/2022 2026   HGB 11.6 (L) 06/05/2022 1505   HGB 11.7 (L) 04/20/2022 1106   HCT 36.0 (L) 09/23/2022 2026   HCT 36.2 (L) 04/20/2022 1106   PLT 88 (L) 09/23/2022 1056   PLT 62 (L) 06/05/2022 1505   PLT 67 (LL) 04/20/2022 1106   MCV 98.1 09/23/2022 1056   MCV 89 04/20/2022 1106   MCH 29.9 09/23/2022 1056   MCHC 30.5 09/23/2022 1056   RDW 16.4 (H) 09/23/2022 1056   RDW 16.2 (H) 04/20/2022 1106   LYMPHSABS 0.4 (L) 06/05/2022 2044   MONOABS 0.3 06/05/2022 2044   EOSABS 0.1 06/05/2022 2044   BASOSABS 0.0 06/05/2022 2044   MR Abdomen W Wo Contrast 07/14/2022  Narrative CLINICAL DATA:  Small pancreatic head lesion on CT of 02/24/2022. Suspected hepatic cirrhosis and possible hepatic masses on ultrasound of 04/30/2022.  EXAM: MRI ABDOMEN WITHOUT AND WITH CONTRAST  TECHNIQUE: Multiplanar multisequence MR imaging of the abdomen was performed both before and after the administration of intravenous contrast.  CONTRAST:  6 cc Vueway  COMPARISON:  Ultrasound 04/30/2022 and CT scan 02/23/2022  FINDINGS: Despite efforts by the technologist and patient, motion  artifact is present on today's exam and could not be eliminated. This reduces exam sensitivity and specificity.  Lower chest: Small type 1 hiatal hernia. Notable uphill paraesophageal varices.  Hepatobiliary: Small liver. Mild prominence of the lateral segment left hepatic lobe may be a subtle indicator of cirrhosis. No steatosis. Small posterior filling defect in the gallbladder for example on image 18 series 4, not appreciably enhancing, compatible with either gallbladder polyp or small gallstone. This measures about 0.6 cm.  No arterial phase enhancing lesion in the liver. No specific liver lesion is identified to corroborate with the hypoechoic focal findings on ultrasound. No definite restriction of diffusion in the liver.  Pancreas: Atrophic pancreas especially the pancreatic tail. Questionable 7 by 4 mm cyst in the pancreatic head on image 48 of series 17, not enhancing.  Spleen:  Unremarkable, no splenomegaly.  Adrenals/Urinary Tract: Multiple Bosniak category 1 renal cysts are benign and warrant no further workup. Adrenal glands unremarkable.  Stomach/Bowel: No definite dilated bowel.  Vascular/Lymphatic: Atherosclerosis of the abdominal aorta. Collateral vessels in the right gastric region compatible with portal venous hypertension. Patent celiac trunk and superior mesenteric artery.  Other:  Extensive ascites and mesenteric edema.  Subcutaneous edema.  Musculoskeletal: Lower lumbar spondylosis and degenerative disc disease.  IMPRESSION: 1. No specific liver lesion is identified to corroborate with the hypoechoic focal findings on ultrasound. 2. Extensive ascites and mesenteric edema. Subcutaneous edema. 3. Small liver with mild prominence of the lateral segment left hepatic lobe may be a subtle indicator of cirrhosis. 4. Small posterior filling defect in the gallbladder compatible with either gallbladder polyp or small gallstone. On ultrasound this  was thought to be a polyp although it does not demonstrably enhance. 5. Questionable 7 by 4 mm cyst in the pancreatic head. This  could be incidental or could reflect a small intraductal papillary mucinous neoplasm. Consider follow up pancreatic protocol MRI with and without contrast in 2 years time. This recommendation follows ACR consensus guidelines: Management of Incidental Pancreatic Cysts: A White Paper of the ACR Incidental Findings Committee. J Am Coll Radiol [id number]. 6. Notable uphill paraesophageal varices and right gastric varices compatible with portal venous hypertension. 7. Lower lumbar spondylosis and degenerative disc disease. 8. Small type 1 hiatal hernia. 9. Despite efforts by the technologist and patient, motion artifact is present on today's exam and could not be eliminated. This reduces exam sensitivity and specificity.   Electronically Signed By: Van Clines M.D. On: 07/14/2022 20:40  US Abdomen Complete 09/23/2022  Narrative CLINICAL DATA:  Ascites  EXAM: ABDOMEN ULTRASOUND COMPLETE  COMPARISON:  MRI 07/14/2022, ultrasound 04/30/2022  FINDINGS: Gallbladder: No shadowing stones. Sludge within the gallbladder. Polyp versus tumefactive sludge measuring up to 10 mm. Upper normal wall thickness. Negative sonographic Murphy  Common bile duct: Diameter: 2.6 mm  Liver: Heterogenous slightly nodular appearing liver suspicious for cirrhosis. Portal vein is patent on color Doppler imaging with normal direction of blood flow towards the liver.  IVC: No abnormality visualized.  Pancreas: Poorly visualized due to bowel gas  Spleen: Size and appearance within normal limits.  Right Kidney: Length: 7 cm. Cortex is echogenic. Cyst at the upper pole measuring 2.5 cm, no imaging follow-up is recommended. No hydronephrosis  Left Kidney: Length: 7.8 cm. Echogenic cortex. No mass or hydronephrosis.  Abdominal aorta: No aneurysm  visualized.  Other findings: Large volume of ascites.  IMPRESSION: 1. Large volume of ascites. 2. Heterogenous slightly nodular liver suspicious for cirrhosis. 3. Sludge within the gallbladder. 10 mm polyp versus tumefactive sludge. 4. Small echogenic kidneys consistent with medical renal disease.   Electronically Signed By: Donavan Foil M.D. On: 09/23/2022 18:42   Assessment and Plan: * Cirrhosis of liver with ascites ([place]) Maybe EtOH cirrhosis given PMH of EtOH abuse? "h/o heavy use, quit in 2017" GI wanted admit and will see in consult in AM Hepatitis pnl pending Paracentesis ordered Starting Lasix 68m daily and aldactone 58mdaily Not a fan of his MRI findings from December showing esophageal varices, will hold eliquis for the moment until GI can evaluate.  Paroxysmal atrial fibrillation (HCC) H/o cryptogenic stroke, ILR placement, PAF found started on eliquis. Hold eliquis for the moment due to: Worsening thrombocytopenia over past 2 years presumably secondary to progression of cirrhosis and portal HTN Findings of portal HTN with esophageal and paraesophageal varicies demonstrated on MRI in Dec 2023. Will defer to GI wether its safe to restart the eliquis when they see him in consult in AM.  Chronic kidney disease, stage 3b ([place] appears baseline today Monitor after starting diuretics  Inguinal bulge Reducible hernia. Follow up with gen surg as outpt.  Mixed hyperlipidemia Cont statin  Chronic diastolic CHF (congestive heart failure) (HCC) Nl EF, has PFO, has LVH, has impaired relaxation on echo in 2020.  Continue Jardiance Starting Aldactone and Lasix for likely hepatogenic ascites and anasarca      Advance Care Planning:   Code Status: Full Code  Consults: EDP spoke with Dr. DaLoletha CarrowFamily Communication: Family at bedside  Severity of Illness: The appropriate patient status for this patient is OBSERVATION. Observation status is judged to be  reasonable and necessary in order to provide the required intensity of service to ensure the patient's safety. The patient's presenting symptoms, physical exam findings, and initial radiographic  and laboratory data in the context of their medical condition is felt to place them at decreased risk for further clinical deterioration. Furthermore, it is anticipated that the patient will be medically stable for discharge from the hospital within 2 midnights of admission.   Author: Etta Quill., DO 09/23/2022 9:01 PM  For on call review [email or web address].

## 2022-09-23 NOTE — ED Notes (Signed)
To ultrasound

## 2022-09-23 NOTE — ED Provider Notes (Signed)
[facility] Provider Note   CSN: [id number] Arrival date & time: 09/23/22  1022     History  Chief Complaint  Patient presents with   Leg Swelling    Trevor Murphy is a 88 y.o. male.  With a history of CHF with echo on 05/24/2019 showing EF of 60 to 65%, hypertension, dyslipidemia, CKD stage III, history of alcohol abuse, CAD, CVA who presents to the ED for evaluation of bilateral lower extremity and abdominal swelling.  He states that the symptoms have progressively gotten worse over the past 2 to 3 weeks.  He states he does not typically take a diuretic.  He denies shortness of breath, chest pain, dizziness, lightheadedness, nausea, vomiting.  He states he has some intermittent abdominal pain and a left inguinal and umbilical hernia.  These hernias have been present for some time.  States he was encouraged by his primary care provider to come to the ED for evaluation of possible CHF exacerbation.  His main complaint is weakness.  He states he has had some difficulty doing daily activities recently including difficulty rolling over and getting out of bed.  HPI     Home Medications Prior to Admission medications   Medication Sig Start Date End Date Taking? Authorizing Provider  acetaminophen (TYLENOL) 500 MG tablet Take 1,000 mg by mouth 2 (two) times daily as needed for mild pain or moderate pain (leg pain).   Yes [provider]  apixaban (ELIQUIS) 2.5 MG TABS tablet TAKE 1 TABLET(2.5 MG) BY MOUTH TWICE DAILY Patient taking differently: Take 2.5 mg by mouth 2 (two) times daily. 04/27/22  Yes Fenton, Clint R, PA  atorvastatin (LIPITOR) 80 MG tablet TAKE 1 TABLET(80 MG) BY MOUTH DAILY AT 6 PM Patient taking differently: Take 80 mg by mouth daily at 6 PM. 01/09/22  Yes [name], FNP  dapagliflozin propanediol ([name]) 10 MG TABS tablet Take 1 tablet (10 mg total) by mouth daily before breakfast. 06/18/22  Yes [name], FNP   ferrous sulfate (FEROSUL) 325 (65 FE) MG tablet TAKE 1 TABLET(325 MG) BY MOUTH DAILY Patient taking differently: Take 325 mg by mouth daily. 10/13/21  Yes [name], FNP  finasteride (PROSCAR) 5 MG tablet Take 5 mg by mouth daily. 03/09/22  Yes [provider]  LUMIGAN 0.01 % SOLN Place 1 drop into both eyes at bedtime. 08/28/19  Yes [provider]  [place] 1-0.2 % SUSP Follow up with your PCP on when to restart this medication. Patient taking differently: Place 1 drop into both eyes 3 (three) times daily. 06/09/22  Yes Mariel Aloe, MD      Allergies    Patient has no known allergies.    Review of Systems   Review of Systems  Cardiovascular:  Positive for leg swelling.  Gastrointestinal:  Positive for abdominal pain.  All other systems reviewed and are negative.   Physical Exam Updated Vital Signs BP (!) 153/88   Pulse 66   Temp (!) 94.9 F (34.9 C)   Resp 14   SpO2 100%  Physical Exam Vitals and nursing note reviewed.  Constitutional:      General: He is not in acute distress.    Appearance: Normal appearance. He is well-developed. He is not ill-appearing.  HENT:     Head: Normocephalic and atraumatic.  Eyes:     Conjunctiva/sclera: Conjunctivae normal.  Cardiovascular:     Rate and Rhythm: Normal rate and regular rhythm.  Heart sounds: No murmur heard. Pulmonary:     Effort: Pulmonary effort is normal. No respiratory distress.     Breath sounds: Normal breath sounds.  Abdominal:     General: There is distension.     Palpations: Abdomen is soft.     Tenderness: There is no abdominal tenderness. There is no guarding.     Hernia: A hernia is present. Hernia is present in the left inguinal area.     Comments: Left inguinal hernia that was easily reduced with minimal pressure  Musculoskeletal:        General: No swelling.     Cervical back: Neck supple.     Right lower leg: Edema present.     Left lower leg: Edema present.     Comments:  Bilateral lower extremity 2+ pitting edema  Skin:    General: Skin is warm and dry.     Capillary Refill: Capillary refill takes less than 2 seconds.  Neurological:     General: No focal deficit present.     Mental Status: He is alert and oriented to person, place, and time.  Psychiatric:        Mood and Affect: Mood normal.     ED Results / Procedures / Treatments   Labs (all labs ordered are listed, but only abnormal results are displayed) Labs Reviewed  CBC - Abnormal; Notable for the following components:      Result Value   Hemoglobin 12.9 (*)    RDW 16.4 (*)    Platelets 88 (*)    All other components within normal limits  BRAIN NATRIURETIC PEPTIDE - Abnormal; Notable for the following components:   B Natriuretic Peptide 246.0 (*)    All other components within normal limits  COMPREHENSIVE METABOLIC PANEL - Abnormal; Notable for the following components:   Chloride 114 (*)    CO2 13 (*)    Creatinine, Ser 1.65 (*)    Calcium 7.8 (*)    Albumin 2.4 (*)    AST 52 (*)    Total Bilirubin 1.6 (*)    GFR, Estimated 40 (*)    All other components within normal limits  URINALYSIS, ROUTINE W REFLEX MICROSCOPIC - Abnormal; Notable for the following components:   APPearance HAZY (*)    Glucose, UA >=500 (*)    Hgb urine dipstick MODERATE (*)    Ketones, ur 5 (*)    Protein, ur 100 (*)    Bacteria, UA RARE (*)    All other components within normal limits  TROPONIN I (HIGH SENSITIVITY) - Abnormal; Notable for the following components:   Troponin I (High Sensitivity) 26 (*)    All other components within normal limits  LACTIC ACID, PLASMA  LACTIC ACID, PLASMA  AMMONIA  SALICYLATE LEVEL  ACETAMINOPHEN LEVEL  PROTIME-INR  HEPATITIS PANEL, ACUTE  I-STAT VENOUS BLOOD GAS, ED    EKG EKG Interpretation  Date/Time:  Wednesday September 23 2022 10:39:18 EST Ventricular Rate:  81 PR Interval:  240 QRS Duration: 126 QT Interval:  432 QTC Calculation: 501 R  Axis:   0 Text Interpretation: Sinus rhythm with 1st degree A-V block with Premature atrial complexes with Abberant conduction Right bundle branch block Possible Lateral infarct , age undetermined Cannot rule out Inferior infarct , age undetermined Abnormal ECG No significant change since last tracing Confirmed by Deno Etienne [phone]) on 09/23/2022 6:48:07 PM  Radiology US Abdomen Complete  Result Date: 09/23/2022 CLINICAL DATA:  Ascites EXAM: ABDOMEN ULTRASOUND COMPLETE COMPARISON:  MRI 07/14/2022, ultrasound 04/30/2022 FINDINGS: Gallbladder: No shadowing stones. Sludge within the gallbladder. Polyp versus tumefactive sludge measuring up to 10 mm. Upper normal wall thickness. Negative sonographic Murphy Common bile duct: Diameter: 2.6 mm Liver: Heterogenous slightly nodular appearing liver suspicious for cirrhosis. Portal vein is patent on color Doppler imaging with normal direction of blood flow towards the liver. IVC: No abnormality visualized. Pancreas: Poorly visualized due to bowel gas Spleen: Size and appearance within normal limits. Right Kidney: Length: 7 cm. Cortex is echogenic. Cyst at the upper pole measuring 2.5 cm, no imaging follow-up is recommended. No hydronephrosis Left Kidney: Length: 7.8 cm. Echogenic cortex. No mass or hydronephrosis. Abdominal aorta: No aneurysm visualized. Other findings: Large volume of ascites. IMPRESSION: 1. Large volume of ascites. 2. Heterogenous slightly nodular liver suspicious for cirrhosis. 3. Sludge within the gallbladder. 10 mm polyp versus tumefactive sludge. 4. Small echogenic kidneys consistent with medical renal disease. Electronically Signed   By: Donavan Foil M.D.   On: 09/23/2022 18:42   DG Chest 2 View  Result Date: 09/23/2022 CLINICAL DATA:  congestive heart failure EXAM: CHEST - 2 VIEW COMPARISON:  10/11/2020 FINDINGS: The heart size and mediastinal contours are within normal limits. Both lungs are clear. The visualized skeletal structures are  unremarkable except for degenerative changes of the spine with a similar mild scoliosis. Bones are osteopenic. Cardiac loop recorder over the chest as before. Aorta atherosclerotic. IMPRESSION: No active cardiopulmonary disease. Electronically Signed   By: Jerilynn Mages.  Shick M.D.   On: 09/23/2022 11:36    Procedures Procedures    Medications Ordered in ED Medications - No data to display  ED Course/ Medical Decision Making/ A&P Clinical Course as of 09/23/22 1949  Wed Sep 23, 2022  1934 Spoke with hospitalist Dr. Alcario Drought.  He will not admit patient until GI has been consulted despite patient's inability to be discharged home due to failure to thrive [AS]    Clinical Course User Index [AS] Samik Balkcom, [place], PA-C                             Medical Decision Making Amount and/or Complexity of Data Reviewed Labs: ordered. Radiology: ordered.  This patient presents to the ED for concern of ascites and bilateral lower extremity swelling, this involves an extensive number of treatment options, and is a complaint that carries with it a high risk of complications and morbidity.  The differential diagnosis includes cirrhosis, CHF exacerbation, SBP  Co morbidities that complicate the patient evaluation  CHF with echo on 05/24/2019 showing EF of 60 to 65%, hypertension, dyslipidemia, CKD stage III, history of alcohol abuse, CAD, CVA  My initial workup includes labs, imaging  Additional history obtained from: Nursing notes from this visit.  I ordered, reviewed and interpreted labs which include: CBC, CMP, BNP, troponin, urinalysis elevated BNP of 246, slightly elevated troponin of 26, decreased bicarb of 13, increased creatinine of 1.65, decreased albumin of 2.4  I ordered imaging studies including chest x-ray, abdominal ultrasound I independently visualized and interpreted imaging which showed no acute cardiopulmonary abnormalities.  Large volume ascites, evidence of cirrhotic liver and CKD I  agree with the radiologist interpretation  Afebrile, hypertensive but otherwise hemodynamically stable.  88 year old male presents to the ED for evaluation of lower extremity and abdominal swelling has been getting progressively worse over the past 3 weeks.  On exam, he had an easily reducible left inguinal hernia, abdominal distention and bilateral 2+  pitting edema to the level of the knees.  His lab work was significant for an elevated BNP of 246, slightly elevated troponin of 26, decreased bicarb of 13, increased creatinine of 1.65, decreased albumin of 2.4.  Patient will be admitted to hospitalist for failure to thrive with signs and symptoms of liver failure. He has had significant weakness with difficulty getting out of bed.  Care will be handed off to Dr. Tyrone Nine at shift change.  Current plan is likely admission for expedited care including diagnostic and therapeutic paracentesis and GI consult due to worsening ascites and weakness.  Plan may change at the discretion of the oncoming provider.  Please see his note for final disposition and decision making  Note: Portions of this report may have been transcribed using voice recognition software. Every effort was made to ensure accuracy; however, inadvertent computerized transcription errors may still be present.        Final Clinical Impression(s) / ED Diagnoses Final diagnoses:  Failure to thrive in adult  Other ascites  Renal insufficiency    Rx / DC Orders ED Discharge Orders     None         Roylene Reason, PA-C 09/23/22 [place], [place], DO 09/23/22 2052

## 2022-09-23 NOTE — Assessment & Plan Note (Addendum)
-   Maybe EtOH cirrhosis given PMH of EtOH abuse? "h/o heavy use, quit in 2017" - s/p paracentesis on 2/15 with 2.1 L fluid removed - GI following, appreciate assistance - continues on spironolactone; on hold is lasix -Limited options in setting of his decompensated cirrhosis.  Not a transplant candidate or TIPS candidate due to comorbidities and severe MR; CKD also limits diuretic use.  Given difficulty with treatment options, palliative care consult recommended which is certainly appropriate in this context -GOC discussions have been held with palliative care. DNR at this time, but continuing treatment as noted; patient and son seem to understand generalized guarded prognosis. MOST form has been filled out as well - d/c statin given no further mortality benefit and contraindication now -Likely will need intermittent outpatient paracenteses as fluid reaccumulates

## 2022-09-23 NOTE — Progress Notes (Addendum)
I,Sheena H Holbrook,acting as a Neurosurgeon for [name], FNP.,have documented all relevant documentation on the behalf of [name], FNP,as directed by  [name], FNP while in the presence of [name], FNP.    Subjective:     Patient ID: Trevor Murphy , male    DOB: [birth year] , 88 y.o.   MRN: [id number]   Chief Complaint  Patient presents with   Follow-up    HPI  Patient presents today for follow up and medication review.  Patient's sister reports increased abdominal swelling, BLE edema for the last few weeks, weakness for about the past month. Patient denies nausea/emesis, diarrhea or BM changes. Patient has several hernias 1 to include in the left groin area which are causing pain.   He had a fall yesterday after getting back from the foot doctor yesterday. Her son has been having to help.   Last bowel movement this morning was normal.   Referral was made to GI in December however family has not heard from the referral.     Past Medical History:  Diagnosis Date   Arthritis    CAD (coronary artery disease)    a. Cath 08/2009: minimal nonobstructive disease (25% LAD). b. Lexiscan nuc 6/215: EF 54%, no ischemia or infarction.   Chronic diastolic CHF (congestive heart failure)    a. Echo 2013: EF 55-60%, mild AI, mild MR. b. Echo 5/15 with EF 60-65%, no WMA, mild LVH, mild AI.   CKD (chronic kidney disease) stage 3, GFR 30-59 ml/min 07/26/2012   a. Felt likely due to HTN.   CVA (cerebral infarction) 07/28/2012   Glaucoma    Gout    H/O umbilical hernia repair    Headache(784.0)    " WHEN MY BLOOD PRESSURE IS UP"   History of acute renal failure    History of alcohol abuse    HTN (hypertension)    Hypertension    a. Patent renal arteries 2005.   Noncompliance    OA (osteoarthritis)    Other and unspecified hyperlipidemia    Poor circulation    Sinus bradycardia    SVT (supraventricular tachycardia)    a. Possible AVNRT.  Got adenosine from EMS in 2015 for  SVT.  During 5/15 hospitalization had episode of short R-P tachycardia (possible AVNRT) terminated by adenosine.  b. 04/2016 - recurrent tachycardia (SVT vs. AVNRT), again terminated by Adenosine   Visual impairment      Family History  Problem Relation Age of Onset   CVA Mother    Hypertension Brother    Hypertension Sister    Hypertension Brother    Stroke Sister    Heart attack Neg Hx     No current facility-administered medications for this visit. No current outpatient medications on file.  Facility-Administered Medications Ordered in Other Visits:    0.9 %  sodium chloride infusion, , Intravenous, Continuous, Pahwani, Ravi, MD, Last Rate: 100 mL/hr at 11/23/22 0457, New Bag at 11/23/22 0457   acetaminophen (TYLENOL) tablet 650 mg, 650 mg, Oral, Q6H PRN, 650 mg at 11/22/22 0921 **OR** acetaminophen (TYLENOL) suppository 650 mg, 650 mg, Rectal, Q6H PRN, Allena Katz, Vishal R, MD   brinzolamide (AZOPT) 1 % ophthalmic suspension 1 drop, 1 drop, Both Eyes, TID, 1 drop at 11/23/22 0926 **AND** brimonidine (ALPHAGAN) 0.2 % ophthalmic solution 1 drop, 1 drop, Both Eyes, TID, Pahwani, Ravi, MD, 1 drop at 11/23/22 0926   finasteride (PROSCAR) tablet 5 mg, 5 mg, Oral, Daily, Pahwani, Ravi, MD, 5 mg  at 11/23/22 1010   hydrALAZINE (APRESOLINE) injection 10 mg, 10 mg, Intravenous, Q4H PRN, Pahwani, Ravi, MD   HYDROmorphone (DILAUDID) injection 0.5 mg, 0.5 mg, Intravenous, Q2H PRN, Patel, Vishal R, MD   latanoprost (XALATAN) 0.005 % ophthalmic solution 1 drop, 1 drop, Both Eyes, QHS, Pahwani, Ravi, MD, 1 drop at 11/22/22 2048   ondansetron (ZOFRAN) tablet 4 mg, 4 mg, Oral, Q6H PRN **OR** ondansetron (ZOFRAN) injection 4 mg, 4 mg, Intravenous, Q6H PRN, Patel, Vishal R, MD   sodium chloride flush (NS) 0.9 % injection 3 mL, 3 mL, Intravenous, Q12H, Allena Katz, Vishal R, MD, 3 mL at 11/23/22 0927   traMADol (ULTRAM) tablet 50 mg, 50 mg, Oral, Q6H PRN, Kinsinger, De Blanch, MD   No Known Allergies   Review of  Systems  Constitutional:  Positive for activity change and fatigue.  Cardiovascular:  Positive for leg swelling.  Gastrointestinal:  Positive for abdominal distention and abdominal pain.  Musculoskeletal: Negative.   Neurological:  Positive for weakness.  Psychiatric/Behavioral: Negative.    All other systems reviewed and are negative.    Today's Vitals   09/23/22 0921  BP: 124/82  Temp: 97.7 F (36.5 C)  TempSrc: Oral  Height:  (1.778 m)   Body mass index is 20.81 kg/m.   Objective:  Physical Exam Vitals reviewed.  Constitutional:      General: He is not in acute distress.    Appearance: Normal appearance.  Cardiovascular:     Rate and Rhythm: Normal rate and regular rhythm.     Pulses: Normal pulses.     Heart sounds: Normal heart sounds. No murmur heard. Pulmonary:     Effort: Pulmonary effort is normal. No respiratory distress.     Breath sounds: Normal breath sounds. No wheezing.  Abdominal:     General: There is distension (semi firm to abdomen around to lateral areas bilaterally).     Palpations: There is no fluid wave, hepatomegaly or mass.     Tenderness: There is generalized abdominal tenderness.     Hernia: A hernia is present. Hernia is present in the ventral area and left inguinal area.  Musculoskeletal:     Right lower leg: Edema (2+) present.     Left lower leg: Edema (2+) present.  Skin:    General: Skin is warm and dry.  Neurological:     General: No focal deficit present.     Mental Status: He is alert and oriented to person, place, and time.     Cranial Nerves: No cranial nerve deficit.     Motor: No weakness.  Psychiatric:        Mood and Affect: Mood normal.        Behavior: Behavior normal.        Thought Content: Thought content normal.        Judgment: Judgment normal.         Assessment And Plan:     1. Weakness Comments: In wheelchair today was on able to walk all the way to the back of the office to the patient room.  Will  order home health nurse and PT for strength evaluation - Ambulatory referral to Home Health - Ambulatory referral to General Surgery  2. Lower leg edema Comments: 2+ pitting edema to lower extremities bilaterally. - Ambulatory referral to Home Health - Ambulatory referral to General Surgery  3. Abdomen enlarged Comments: Abdominal distention semi for goals around laterally bilateral.  Generalized tenderness noted - Ambulatory referral to General Surgery  4. Other ascites Comments: He is to f/u with GI for further evaluation  5. Chronic diastolic heart failure (HCC) Comments: He has not seen Cardiology in quite some time, have advised his sister to follow up  6. Inguinal bulge Comments: Reducible inguinal hernia to left groin however has some tenderness will refer to general surgery for further evaluation and option. - Ambulatory referral to General Surgery  7. Hiatal hernia Comments: This was seen on his last CT of the abdomen in December 2023.  Referral to general surgery  8. Hypertensive heart disease with chronic diastolic congestive heart failure (HCC) Comments: Continue f/u with Cardiology    Spoke with son and sister and recommended going to the emergency room for evaluation due to his condition.  Patient was given opportunity to ask questions. Patient verbalized understanding of the plan and was able to repeat key elements of the plan. All questions were answered to their satisfaction.  [name], FNP   I, [name], FNP, have reviewed all documentation for this visit. The documentation on 11/23/22 for the exam, diagnosis, procedures, and orders are all accurate and complete.   IF YOU HAVE BEEN REFERRED TO A SPECIALIST, IT MAY TAKE 1-2 WEEKS TO SCHEDULE/PROCESS THE REFERRAL. IF YOU HAVE NOT HEARD FROM US/SPECIALIST IN TWO WEEKS, PLEASE GIVE [place] A CALL AT [phone].   THE PATIENT IS ENCOURAGED TO PRACTICE SOCIAL DISTANCING DUE TO THE COVID-19 PANDEMIC.

## 2022-09-23 NOTE — ED Provider Triage Note (Signed)
Emergency Medicine Provider Triage Evaluation Note  Trevor Murphy , a 88 y.o. male  was evaluated in triage.  Pt complains of lower extremity and abdominal swelling.  Symptoms began approximately 1 month ago.  Was referred to the ED by his primary care provider for evaluation of possible CHF exacerbation.  States he is weaker than normal but denies all other symptoms.  Review of Systems  Positive: As above Negative: As above  Physical Exam  BP (!) 163/72 (BP Location: Right Arm)   Pulse 84   Temp (!) 97.3 F (36.3 C) (Oral)   Resp 16   SpO2 100%  Gen:   Awake, no distress   Resp:  Normal effort  MSK:   Moves extremities without difficulty  Other:  Bilateral 3+ pitting edema to the level of the knee.  Abdominal enlargement  Medical Decision Making  Medically screening exam initiated at 10:55 AM.  Appropriate orders placed.  [name] was informed that the remainder of the evaluation will be completed by another provider, this initial triage assessment does not replace that evaluation, and the importance of remaining in the ED until their evaluation is complete.     Trevor Murphy, [place] 09/23/22 1056

## 2022-09-23 NOTE — Assessment & Plan Note (Addendum)
-   Last echo reviewed, October 2020: EF 55 to 60%, severe LVH, impaired relaxation -On Jardiance at home - Continue spironolactone; lasix on hold - BNP also elevated - echo repeated: EF 55-60%; mild LVH, indeterminate diastolic parameters; moderately severe to severe MR - will recommend outpatient cardiology followup

## 2022-09-23 NOTE — Assessment & Plan Note (Addendum)
H/o cryptogenic stroke, ILR placement, PAF found started on eliquis - now he has worsening liver decompensation and severe thrombocytopenia; also discussed with GI - it's feasible that anticoag could be continued very cautiously, but I agree it's still rather high risk. The alternative is not the greatest to holding either; but I will continue to hold for now; discussed with patient and family prior to discharge

## 2022-09-23 NOTE — Assessment & Plan Note (Addendum)
-   patient has history of CKD3b. Baseline creat ~ 1.8, eGFR 37-42 -Difficulty with diuresis and labile renal fxn

## 2022-09-23 NOTE — ED Triage Notes (Signed)
Patient with history of congestive heart failure referred from [place] office for further evaluation and lower extremity and abdominal swelling that started one month ago. Patient denies shortness of breath, denies chest pain, is alert, oriented and speaking in complete sentences.

## 2022-09-23 NOTE — ED Notes (Signed)
The pt arrived from his docyors office at 1000am  c/o abd pain with some distention  wife at  the bedside

## 2022-09-23 NOTE — ED Notes (Signed)
ED TO INPATIENT HANDOFF REPORT  ED Nurse Name and Phone #: Gregary Signs [phone]  S Name/Age/Gender Trevor Murphy 88 y.o. male Room/Bed: 008C/008C  Code Status   Code Status: Full Code  Home/SNF/Other Home Patient oriented to: self Is this baseline? Yes   Triage Complete: Triage complete  Chief Complaint Cirrhosis of liver with ascites ([place]) [K74.60, R18.8]  Triage Note Patient with history of congestive heart failure referred from [place] office for further evaluation and lower extremity and abdominal swelling that started one month ago. Patient denies shortness of breath, denies chest pain, is alert, oriented and speaking in complete sentences.   Allergies No Known Allergies  Level of Care/Admitting Diagnosis ED Disposition     ED Disposition  Admit   Condition  --   Comment  Hospital Area: [place] [[id number]]  Level of Care: Med-Surg [16]  May place patient in observation at [facility] or [facility] if equivalent level of care is available:: No  Covid Evaluation: Asymptomatic - no recent exposure (last 10 days) testing not required  Diagnosis: Cirrhosis of liver with ascites [id number]  Admitting Physician: Etta Quill [id number]  Attending Physician: Etta Quill [phone]          B Medical/Surgery History Past Medical History:  Diagnosis Date   Arthritis    CAD (coronary artery disease)    a. Cath 08/2009: minimal nonobstructive disease (25% LAD). b. Lexiscan nuc 6/215: EF 54%, no ischemia or infarction.   Chronic diastolic CHF (congestive heart failure) ([place])    a. Echo 2013: EF 55-60%, mild AI, mild MR. b. Echo 5/15 with EF 60-65%, no WMA, mild LVH, mild AI.   CKD (chronic kidney disease) stage 3, GFR 30-59 ml/min (HCC) 07/26/2012   a. Felt likely due to HTN.   CVA (cerebral infarction) 07/28/2012   Glaucoma    Gout    H/O umbilical hernia repair    Headache(784.0)    " WHEN MY BLOOD PRESSURE IS UP"   History of acute renal  failure    History of alcohol abuse    HTN (hypertension)    Hypertension    a. Patent renal arteries 2005.   Noncompliance    OA (osteoarthritis)    Other and unspecified hyperlipidemia    Poor circulation    Sinus bradycardia    SVT (supraventricular tachycardia)    a. Possible AVNRT.  Got adenosine from EMS in 2015 for SVT.  During 5/15 hospitalization had episode of short R-P tachycardia (possible AVNRT) terminated by adenosine.  b. 04/2016 - recurrent tachycardia (SVT vs. AVNRT), again terminated by Adenosine   Visual impairment    Past Surgical History:  Procedure Laterality Date   APPENDECTOMY     BIOPSY  08/12/2020   Procedure: BIOPSY;  Surgeon: Juanita Craver, MD;  Location: [facility] ENDOSCOPY;  Service: Endoscopy;;   BUBBLE STUDY  05/24/2019   Procedure: BUBBLE STUDY;  Surgeon: Donato Heinz, MD;  Location: [facility] ENDOSCOPY;  Service: Endoscopy;;   CARDIAC CATHETERIZATION  11/2003   EF 50-55%   CARDIAC CATHETERIZATION  08/2009   CATARACT EXTRACTION EXTRACAPSULAR Right 05/17/2013   Procedure: CATARACT EXTRACTION EXTRACAPSULAR WITH INTRAOCULAR LENS PLACEMENT ([place]) RIGHT EYE;  Surgeon: Marylynn Pearson, MD;  Location: [place];  Service: Ophthalmology;  Laterality: Right;   COLONOSCOPY     ESOPHAGOGASTRODUODENOSCOPY (EGD) WITH PROPOFOL N/A 08/12/2020   Procedure: ESOPHAGOGASTRODUODENOSCOPY (EGD) WITH PROPOFOL;  Surgeon: Juanita Craver, MD;  Location: [facility] ENDOSCOPY;  Service: Endoscopy;  Laterality: N/A;  HERNIA REPAIR     LOOP RECORDER INSERTION N/A 05/24/2019   Procedure: LOOP RECORDER INSERTION;  Surgeon: Constance Haw, MD;  Location: [place]  CV LAB;  Service: Cardiovascular;  Laterality: N/A;   TEE WITHOUT CARDIOVERSION N/A 05/24/2019   Procedure: TRANSESOPHAGEAL ECHOCARDIOGRAM (TEE);  Surgeon: Donato Heinz, MD;  Location: [facility] ENDOSCOPY;  Service: Endoscopy;  Laterality: N/A;   VENTRAL HERNIA REPAIR  03/07/2012   Procedure: LAPAROSCOPIC VENTRAL HERNIA;  Surgeon: Gwenyth Ober, MD;  Location: [place];  Service: General;  Laterality: N/A;     A IV Location/Drains/Wounds Patient Lines/Drains/Airways Status     Active Line/Drains/Airways     Name Placement date Placement time Site Days   Peripheral IV 09/23/22 20 G Anterior;Distal;Right;Upper Arm 09/23/22  2020  Arm  less than 1            Intake/Output Last 24 hours  Intake/Output Summary (Last 24 hours) at 09/23/2022 2349 Last data filed at 09/23/2022 2256 Gross per 24 hour  Intake --  Output 1 ml  Net -1 ml    Labs/Imaging Results for orders placed or performed during the hospital encounter of 09/23/22 (from the past 48 hour(s))  CBC     Status: Abnormal   Collection Time: 09/23/22 10:56 AM  Result Value Ref Range   WBC 4.1 4.0 - 10.5 K/uL   RBC 4.31 4.22 - 5.81 MIL/uL   Hemoglobin 12.9 (L) 13.0 - 17.0 g/dL   HCT 42.3 39.0 - 52.0 %   MCV 98.1 80.0 - 100.0 fL   MCH 29.9 26.0 - 34.0 pg   MCHC 30.5 30.0 - 36.0 g/dL   RDW 16.4 (H) 11.5 - 15.5 %   Platelets 88 (L) 150 - 400 K/uL    Comment: Immature Platelet Fraction may be clinically indicated, consider ordering this additional test [id number] REPEATED TO VERIFY    nRBC 0.0 0.0 - 0.2 %    Comment: Performed at [facility], [place] [address] [place], [place] [address]  Brain natriuretic peptide     Status: Abnormal   Collection Time: 09/23/22 10:56 AM  Result Value Ref Range   B Natriuretic Peptide 246.0 (H) 0.0 - 100.0 pg/mL    Comment: Performed at [place] [address] [place], [place] [address]  Comprehensive metabolic panel     Status: Abnormal   Collection Time: 09/23/22 10:56 AM  Result Value Ref Range   Sodium 139 135 - 145 mmol/L   Potassium 3.7 3.5 - 5.1 mmol/L   Chloride 114 (H) 98 - 111 mmol/L   CO2 13 (L) 22 - 32 mmol/L   Glucose, Bld 99 70 - 99 mg/dL    Comment: Glucose reference range applies only to samples taken after fasting for at least 8 hours.   BUN 15 8 - 23 mg/dL   Creatinine, Ser 1.65  (H) 0.61 - 1.24 mg/dL   Calcium 7.8 (L) 8.9 - 10.3 mg/dL   Total Protein 6.7 6.5 - 8.1 g/dL   Albumin 2.4 (L) 3.5 - 5.0 g/dL   AST 52 (H) 15 - 41 U/L   ALT 43 0 - 44 U/L   Alkaline Phosphatase 125 38 - 126 U/L   Total Bilirubin 1.6 (H) 0.3 - 1.2 mg/dL   GFR, Estimated 40 (L) >60 mL/min    Comment: (NOTE) Calculated using the CKD-EPI Creatinine Equation (2021)    Anion gap 12 5 - 15    Comment: Performed at [facility]  Lab, [address] [place], [place] [address]  Troponin I (High Sensitivity)     Status: Abnormal   Collection Time: 09/23/22  1:42 PM  Result Value Ref Range   Troponin I (High Sensitivity) 26 (H) <18 ng/L    Comment: (NOTE) Elevated high sensitivity troponin I (hsTnI) values and significant  changes across serial measurements may suggest ACS but many other  chronic and acute conditions are known to elevate hsTnI results.  Refer to the "Links" section for chest pain algorithms and additional  guidance. Performed at [facility], [place] [address], [place], [place] [address]   Urinalysis, Routine w reflex microscopic -Urine, Random     Status: Abnormal   Collection Time: 09/23/22  4:14 PM  Result Value Ref Range   Color, Urine YELLOW YELLOW   APPearance HAZY (A) CLEAR   Specific Gravity, Urine 1.025 1.005 - 1.030   pH 6.0 5.0 - 8.0   Glucose, UA >=500 (A) NEGATIVE mg/dL   Hgb urine dipstick MODERATE (A) NEGATIVE   Bilirubin Urine NEGATIVE NEGATIVE   Ketones, ur 5 (A) NEGATIVE mg/dL   Protein, ur 100 (A) NEGATIVE mg/dL   Nitrite NEGATIVE NEGATIVE   Leukocytes,Ua NEGATIVE NEGATIVE   RBC / HPF 0-5 0 - 5 RBC/hpf   WBC, UA 0-5 0 - 5 WBC/hpf   Bacteria, UA RARE (A) NONE SEEN   Squamous Epithelial / HPF 0-5 0 - 5 /HPF   Mucus PRESENT    Hyaline Casts, UA PRESENT    Granular Casts, UA PRESENT     Comment: Performed at [facility], [place] [address] [place], [place] [address]  I-Stat venous blood gas, [facility], [facility], [facility])     Status: Abnormal    Collection Time: 09/23/22  8:26 PM  Result Value Ref Range   pH, Ven 7.374 7.25 - 7.43   pCO2, Ven 32.4 (L) 44 - 60 mmHg   pO2, Ven 79 (H) 32 - 45 mmHg   Bicarbonate 18.9 (L) 20.0 - 28.0 mmol/L   TCO2 20 (L) 22 - 32 mmol/L   O2 Saturation 96 %   Acid-base deficit 5.0 (H) 0.0 - 2.0 mmol/L   Sodium 141 135 - 145 mmol/L   Potassium 4.8 3.5 - 5.1 mmol/L   Calcium, Ion 0.99 (L) 1.15 - 1.40 mmol/L   HCT 36.0 (L) 39.0 - 52.0 %   Hemoglobin 12.2 (L) 13.0 - 17.0 g/dL   Sample type VENOUS    US Abdomen Complete  Result Date: 09/23/2022 CLINICAL DATA:  Ascites EXAM: ABDOMEN ULTRASOUND COMPLETE COMPARISON:  MRI 07/14/2022, ultrasound 04/30/2022 FINDINGS: Gallbladder: No shadowing stones. Sludge within the gallbladder. Polyp versus tumefactive sludge measuring up to 10 mm. Upper normal wall thickness. Negative sonographic Murphy Common bile duct: Diameter: 2.6 mm Liver: Heterogenous slightly nodular appearing liver suspicious for cirrhosis. Portal vein is patent on color Doppler imaging with normal direction of blood flow towards the liver. IVC: No abnormality visualized. Pancreas: Poorly visualized due to bowel gas Spleen: Size and appearance within normal limits. Right Kidney: Length: 7 cm. Cortex is echogenic. Cyst at the upper pole measuring 2.5 cm, no imaging follow-up is recommended. No hydronephrosis Left Kidney: Length: 7.8 cm. Echogenic cortex. No mass or hydronephrosis. Abdominal aorta: No aneurysm visualized. Other findings: Large volume of ascites. IMPRESSION: 1. Large volume of ascites. 2. Heterogenous slightly nodular liver suspicious for cirrhosis. 3. Sludge within the gallbladder. 10 mm polyp versus tumefactive sludge. 4. Small echogenic kidneys consistent with medical renal  disease. Electronically Signed   By: Donavan Foil M.D.   On: 09/23/2022 18:42   DG Chest 2 View  Result Date: 09/23/2022 CLINICAL DATA:  congestive heart failure EXAM: CHEST - 2 VIEW COMPARISON:  10/11/2020 FINDINGS:  The heart size and mediastinal contours are within normal limits. Both lungs are clear. The visualized skeletal structures are unremarkable except for degenerative changes of the spine with a similar mild scoliosis. Bones are osteopenic. Cardiac loop recorder over the chest as before. Aorta atherosclerotic. IMPRESSION: No active cardiopulmonary disease. Electronically Signed   By: Jerilynn Mages.  Shick M.D.   On: 09/23/2022 11:36    Pending Labs Unresulted Labs (From admission, onward)     Start     Ordered   09/24/22 0500  CBC  Tomorrow morning,   R        09/23/22 2041   09/24/22 0500  Comprehensive metabolic panel  Tomorrow morning,   R        09/23/22 2041   09/23/22 2200  Lactic acid, plasma  Once,   STAT        09/23/22 2200   09/23/22 1917  Hepatitis panel, acute  Once,   URGENT        09/23/22 1916   09/23/22 1908  Protime-INR  Once,   STAT        09/23/22 1907   09/23/22 1858  Lactic acid, plasma  Now then every 2 hours,   R      09/23/22 1859   09/23/22 1858  Ammonia  Once,   STAT        09/23/22 1859   [id number]  Salicylate level  Once,   STAT        09/23/22 1859   09/23/22 1858  Acetaminophen level  Once,   STAT        09/23/22 1859            Vitals/Pain Today's Vitals   09/23/22 2215 09/23/22 2230 09/23/22 2245 09/23/22 2256  BP:  (!) 147/75    Pulse: 67 61 65   Resp: 20 14 16   $ Temp:      TempSrc:      SpO2: 100% 100% 100%   PainSc:    0-No pain    Isolation Precautions No active isolations  Medications Medications  spironolactone (ALDACTONE) tablet 50 mg (50 mg Oral Given 09/23/22 2020)  atorvastatin (LIPITOR) tablet 80 mg (80 mg Oral Given 09/23/22 2026)  dapagliflozin propanediol ([name]) tablet 10 mg (has no administration in time range)  finasteride (PROSCAR) tablet 5 mg (has no administration in time range)  ferrous sulfate tablet 325 mg (has no administration in time range)  brinzolamide (AZOPT) 1 % ophthalmic suspension 1 drop (1 drop Both Eyes  Given 09/23/22 2139)    And  brimonidine (ALPHAGAN) 0.2 % ophthalmic solution 1 drop (1 drop Both Eyes Given 09/23/22 2137)  latanoprost (XALATAN) 0.005 % ophthalmic solution 1 drop (1 drop Both Eyes Given 09/23/22 2138)  furosemide (LASIX) injection 20 mg (has no administration in time range)  ondansetron (ZOFRAN) tablet 4 mg (has no administration in time range)    Or  ondansetron (ZOFRAN) injection 4 mg (has no administration in time range)  furosemide (LASIX) injection 20 mg (20 mg Intravenous Given 09/23/22 2020)    Mobility walks with person assist     Focused Assessments Cardiac Assessment Handoff:    Lab Results  Component Value Date   CKTOTAL 93 03/19/2010   CKMB 2.4 03/19/2010  TROPONINI <0.30 04/12/2014   Lab Results  Component Value Date   DDIMER 1.43 (H) 12/18/2013   Does the Patient currently have chest pain? No    R Recommendations: See Admitting Provider Note  Report given to:   Additional Notes: RPT Temp 97.7

## 2022-09-24 ENCOUNTER — Inpatient Hospital Stay (HOSPITAL_COMMUNITY): Payer: 59

## 2022-09-24 ENCOUNTER — Ambulatory Visit: Payer: Medicare Other

## 2022-09-24 ENCOUNTER — Other Ambulatory Visit: Payer: Self-pay

## 2022-09-24 ENCOUNTER — Ambulatory Visit: Payer: Medicare Other | Admitting: Nurse Practitioner

## 2022-09-24 DIAGNOSIS — Z515 Encounter for palliative care: Secondary | ICD-10-CM | POA: Diagnosis not present

## 2022-09-24 DIAGNOSIS — R600 Localized edema: Secondary | ICD-10-CM | POA: Diagnosis not present

## 2022-09-24 DIAGNOSIS — I5032 Chronic diastolic (congestive) heart failure: Secondary | ICD-10-CM | POA: Diagnosis not present

## 2022-09-24 DIAGNOSIS — Z66 Do not resuscitate: Secondary | ICD-10-CM | POA: Diagnosis present

## 2022-09-24 DIAGNOSIS — W19XXXA Unspecified fall, initial encounter: Secondary | ICD-10-CM | POA: Diagnosis present

## 2022-09-24 DIAGNOSIS — K7460 Unspecified cirrhosis of liver: Secondary | ICD-10-CM

## 2022-09-24 DIAGNOSIS — I8510 Secondary esophageal varices without bleeding: Secondary | ICD-10-CM | POA: Diagnosis present

## 2022-09-24 DIAGNOSIS — I700 Atherosclerosis of aorta: Secondary | ICD-10-CM | POA: Diagnosis present

## 2022-09-24 DIAGNOSIS — K429 Umbilical hernia without obstruction or gangrene: Secondary | ICD-10-CM | POA: Diagnosis present

## 2022-09-24 DIAGNOSIS — Q2112 Patent foramen ovale: Secondary | ICD-10-CM | POA: Diagnosis not present

## 2022-09-24 DIAGNOSIS — I8500 Esophageal varices without bleeding: Secondary | ICD-10-CM | POA: Diagnosis not present

## 2022-09-24 DIAGNOSIS — D649 Anemia, unspecified: Secondary | ICD-10-CM | POA: Insufficient documentation

## 2022-09-24 DIAGNOSIS — I083 Combined rheumatic disorders of mitral, aortic and tricuspid valves: Secondary | ICD-10-CM | POA: Diagnosis present

## 2022-09-24 DIAGNOSIS — K439 Ventral hernia without obstruction or gangrene: Secondary | ICD-10-CM | POA: Diagnosis present

## 2022-09-24 DIAGNOSIS — R188 Other ascites: Secondary | ICD-10-CM | POA: Diagnosis not present

## 2022-09-24 DIAGNOSIS — R1909 Other intra-abdominal and pelvic swelling, mass and lump: Secondary | ICD-10-CM | POA: Diagnosis not present

## 2022-09-24 DIAGNOSIS — K4090 Unilateral inguinal hernia, without obstruction or gangrene, not specified as recurrent: Secondary | ICD-10-CM | POA: Diagnosis present

## 2022-09-24 DIAGNOSIS — R2689 Other abnormalities of gait and mobility: Secondary | ICD-10-CM | POA: Diagnosis not present

## 2022-09-24 DIAGNOSIS — Y92009 Unspecified place in unspecified non-institutional (private) residence as the place of occurrence of the external cause: Secondary | ICD-10-CM | POA: Diagnosis not present

## 2022-09-24 DIAGNOSIS — Z823 Family history of stroke: Secondary | ICD-10-CM | POA: Diagnosis not present

## 2022-09-24 DIAGNOSIS — M25461 Effusion, right knee: Secondary | ICD-10-CM | POA: Diagnosis not present

## 2022-09-24 DIAGNOSIS — D61818 Other pancytopenia: Secondary | ICD-10-CM | POA: Diagnosis not present

## 2022-09-24 DIAGNOSIS — N289 Disorder of kidney and ureter, unspecified: Secondary | ICD-10-CM | POA: Diagnosis not present

## 2022-09-24 DIAGNOSIS — I130 Hypertensive heart and chronic kidney disease with heart failure and stage 1 through stage 4 chronic kidney disease, or unspecified chronic kidney disease: Secondary | ICD-10-CM | POA: Diagnosis present

## 2022-09-24 DIAGNOSIS — M25561 Pain in right knee: Secondary | ICD-10-CM | POA: Diagnosis not present

## 2022-09-24 DIAGNOSIS — Z8673 Personal history of transient ischemic attack (TIA), and cerebral infarction without residual deficits: Secondary | ICD-10-CM | POA: Diagnosis not present

## 2022-09-24 DIAGNOSIS — I2510 Atherosclerotic heart disease of native coronary artery without angina pectoris: Secondary | ICD-10-CM | POA: Diagnosis present

## 2022-09-24 DIAGNOSIS — R198 Other specified symptoms and signs involving the digestive system and abdomen: Secondary | ICD-10-CM | POA: Diagnosis not present

## 2022-09-24 DIAGNOSIS — R627 Adult failure to thrive: Secondary | ICD-10-CM | POA: Diagnosis not present

## 2022-09-24 DIAGNOSIS — I480 Paroxysmal atrial fibrillation: Secondary | ICD-10-CM | POA: Diagnosis not present

## 2022-09-24 DIAGNOSIS — K7031 Alcoholic cirrhosis of liver with ascites: Secondary | ICD-10-CM | POA: Diagnosis present

## 2022-09-24 DIAGNOSIS — E782 Mixed hyperlipidemia: Secondary | ICD-10-CM | POA: Diagnosis not present

## 2022-09-24 DIAGNOSIS — H409 Unspecified glaucoma: Secondary | ICD-10-CM | POA: Diagnosis present

## 2022-09-24 DIAGNOSIS — Z7901 Long term (current) use of anticoagulants: Secondary | ICD-10-CM | POA: Diagnosis not present

## 2022-09-24 DIAGNOSIS — K766 Portal hypertension: Secondary | ICD-10-CM | POA: Diagnosis present

## 2022-09-24 DIAGNOSIS — N1832 Chronic kidney disease, stage 3b: Secondary | ICD-10-CM | POA: Diagnosis not present

## 2022-09-24 HISTORY — PX: IR PARACENTESIS: IMG2679

## 2022-09-24 LAB — BODY FLUID CELL COUNT WITH DIFFERENTIAL
Eos, Fluid: 0 %
Lymphs, Fluid: 58 %
Monocyte-Macrophage-Serous Fluid: 27 % — ABNORMAL LOW (ref 50–90)
Neutrophil Count, Fluid: 15 % (ref 0–25)
Total Nucleated Cell Count, Fluid: 55 cu mm (ref 0–1000)

## 2022-09-24 LAB — ECHOCARDIOGRAM COMPLETE
Area-P 1/2: 2.16 cm2
Height: 70 in
MV M vel: 5.27 m/s
MV Peak grad: 111.1 mmHg
P 1/2 time: 878 msec
Radius: 0.7 cm
S' Lateral: 3.5 cm
Single Plane A4C EF: 39.4 %
Weight: 2317.48 oz

## 2022-09-24 LAB — COMPREHENSIVE METABOLIC PANEL
ALT: 37 U/L (ref 0–44)
AST: 44 U/L — ABNORMAL HIGH (ref 15–41)
Albumin: 2.1 g/dL — ABNORMAL LOW (ref 3.5–5.0)
Alkaline Phosphatase: 120 U/L (ref 38–126)
Anion gap: 8 (ref 5–15)
BUN: 16 mg/dL (ref 8–23)
CO2: 20 mmol/L — ABNORMAL LOW (ref 22–32)
Calcium: 7.7 mg/dL — ABNORMAL LOW (ref 8.9–10.3)
Chloride: 113 mmol/L — ABNORMAL HIGH (ref 98–111)
Creatinine, Ser: 1.91 mg/dL — ABNORMAL HIGH (ref 0.61–1.24)
GFR, Estimated: 34 mL/min — ABNORMAL LOW (ref >60)
Glucose, Bld: 108 mg/dL — ABNORMAL HIGH (ref 70–99)
Potassium: 4.1 mmol/L (ref 3.5–5.1)
Sodium: 141 mmol/L (ref 135–145)
Total Bilirubin: 1 mg/dL (ref 0.3–1.2)
Total Protein: 5.9 g/dL — ABNORMAL LOW (ref 6.5–8.1)

## 2022-09-24 LAB — GRAM STAIN

## 2022-09-24 LAB — HEPATITIS PANEL, ACUTE
HCV Ab: NONREACTIVE
Hep A IgM: NONREACTIVE
Hep B C IgM: NONREACTIVE
Hepatitis B Surface Ag: NONREACTIVE

## 2022-09-24 LAB — CBC
HCT: 32.6 % — ABNORMAL LOW (ref 39.0–52.0)
Hemoglobin: 10.7 g/dL — ABNORMAL LOW (ref 13.0–17.0)
MCH: 30.4 pg (ref 26.0–34.0)
MCHC: 32.8 g/dL (ref 30.0–36.0)
MCV: 92.6 fL (ref 80.0–100.0)
Platelets: 59 10*3/uL — ABNORMAL LOW (ref 150–400)
RBC: 3.52 MIL/uL — ABNORMAL LOW (ref 4.22–5.81)
RDW: 16 % — ABNORMAL HIGH (ref 11.5–15.5)
WBC: 3 10*3/uL — ABNORMAL LOW (ref 4.0–10.5)
nRBC: 0 % (ref 0.0–0.2)

## 2022-09-24 LAB — AMMONIA: Ammonia: 21 umol/L (ref 9–35)

## 2022-09-24 LAB — LACTIC ACID, PLASMA
Lactic Acid, Venous: 2.1 mmol/L (ref 0.5–1.9)
Lactic Acid, Venous: 1.9 mmol/L (ref 0.5–1.9)

## 2022-09-24 LAB — PROTIME-INR
INR: 1.4 — ABNORMAL HIGH (ref 0.8–1.2)
Prothrombin Time: 17.2 seconds — ABNORMAL HIGH (ref 11.4–15.2)

## 2022-09-24 LAB — ALBUMIN, PLEURAL OR PERITONEAL FLUID: Albumin, Fluid: <1.5 g/dL

## 2022-09-24 LAB — GLUCOSE, PLEURAL OR PERITONEAL FLUID: Glucose, Fluid: 128 mg/dL

## 2022-09-24 LAB — LACTATE DEHYDROGENASE, PLEURAL OR PERITONEAL FLUID: LD, Fluid: 25 U/L — ABNORMAL HIGH (ref 3–23)

## 2022-09-24 LAB — SALICYLATE LEVEL: Salicylate Lvl: <7 mg/dL — ABNORMAL LOW (ref 7.0–30.0)

## 2022-09-24 LAB — ACETAMINOPHEN LEVEL: Acetaminophen (Tylenol), Serum: <10 ug/mL — ABNORMAL LOW (ref 10–30)

## 2022-09-24 MED ORDER — LIDOCAINE HCL 1 % IJ SOLN
INTRAMUSCULAR | Status: AC
  Filled 2022-09-24: qty 20

## 2022-09-24 NOTE — Progress Notes (Signed)
Progress Note    SAIQUAN YUREK   [id number]  DOB: 1934-10-13  DOA: 09/23/2022     0 PCP: [name], FNP  Initial CC: swelling  Hospital Course: [name] is an 88 yo male with PMH CKD 3, HTN, dCHF, PFO, cryptogenic stroke s/p ILR, PAF on eliquis, prior heavy etoh use (quit 2017). He presented with lower extremity edema, abdominal swelling, and generalized malaise. Abdominal ultrasound obtained in the ER showed large volume ascites and nodular liver consistent with cirrhosis.  Gallbladder sludge and possible 10 mm polyp noted.  He also had prior MRCP on 07/14/2022 which also showed extensive ascites and mesenteric edema along with paraesophageal varices and right gastric varices compatible with portal venous hypertension.  Due to these findings, GI was consulted on admission.  Patient was also ordered paracentesis for diagnostic and therapeutic purposes and started on Lasix/spironolactone.  Interval History:  No events overnight. He's comfortable when seen this morning. Slightly poor historian but most complains of the swelling in his abdomen.   Assessment and Plan: * Cirrhosis of liver with ascites ([place]) - Maybe EtOH cirrhosis given PMH of EtOH abuse? "h/o heavy use, quit in 2017" - follow up paracentesis results - GI consulted given underlying varices noted on prior imaging and patient on anticoagulation - some increase in renal function after starting on lasix/spirono; watch function for now while on diuretics  Chronic kidney disease, stage 3b ([place]) - patient has history of CKD3b. Baseline creat ~ 1.8, eGFR 37-42   Paroxysmal atrial fibrillation (HCC) H/o cryptogenic stroke, ILR placement, PAF found started on eliquis. Hold eliquis for the moment due to: Worsening thrombocytopenia over past 2 years presumably secondary to progression of cirrhosis and portal HTN Findings of portal HTN with esophageal and paraesophageal varicies demonstrated on MRI in Dec 2023. Will defer to  GI wether its safe to restart the eliquis  Chronic diastolic CHF (congestive heart failure) ([place]) - Last echo reviewed, October 2020: EF 55 to 60%, severe LVH, impaired relaxation -On Jardiance at home - Continue spironolactone and Lasix for now in setting of ascites - BNP also elevated - Update echo this admission  Pancytopenia ([place]) - Suspect this is due to bone marrow suppression from underlying cirrhosis  Inguinal bulge Reducible hernia. Follow up with gen surg as outpt.  Mixed hyperlipidemia Cont statin    Old records reviewed in assessment of this patient  Antimicrobials:   DVT prophylaxis:  SCDs Start: 09/23/22 2040   Code Status:   Code Status: Full Code  Mobility Assessment (last 72 hours)     Mobility Assessment     Row Name 09/24/22 1100 09/24/22 0800 09/24/22 0046       Does patient have an order for bedrest or is patient medically unstable -- No - Continue assessment No - Continue assessment     What is the highest level of mobility based on the progressive mobility assessment? Level 4 (Walks with assist in room) - Balance while marching in place and cannot step forward and back - Complete Level 4 (Walks with assist in room) - Balance while marching in place and cannot step forward and back - Complete Level 4 (Walks with assist in room) - Balance while marching in place and cannot step forward and back - Complete              Barriers to discharge:  Disposition Plan:  Home 2-3 days Status is: Inpt  Objective: Blood pressure (!) 142/76, pulse 76, temperature 98 F (  36.7 C), temperature source Oral, resp. rate 18, height 5' 10"$  (1.778 m), weight 65.7 kg, SpO2 100 %.  Examination: GI Physical Exam Constitutional:      General: He is not in acute distress.    Appearance: Normal appearance.  HENT:     Head: Normocephalic and atraumatic.     Mouth/Throat:     Mouth: Mucous membranes are moist.  Eyes:     Extraocular Movements: Extraocular  movements intact.  Cardiovascular:     Rate and Rhythm: Normal rate.  Pulmonary:     Effort: Pulmonary effort is normal. No respiratory distress.     Breath sounds: Normal breath sounds. No wheezing.  Abdominal:     General: Bowel sounds are normal. There is distension.     Palpations: Abdomen is soft.     Tenderness: There is no abdominal tenderness.  Musculoskeletal:        General: Swelling present. Normal range of motion.     Cervical back: Normal range of motion and neck supple.     Comments: 1-2 + B/L LE edema  Skin:    General: Skin is warm and dry.  Neurological:     General: No focal deficit present.     Mental Status: He is alert.  Psychiatric:        Mood and Affect: Mood normal.      Consultants:  GI  Procedures:    Data Reviewed: Results for orders placed or performed during the hospital encounter of 09/23/22 (from the past 24 hour(s))  Troponin I (High Sensitivity)     Status: Abnormal   Collection Time: 09/23/22  1:42 PM  Result Value Ref Range   Troponin I (High Sensitivity) 26 (H) <18 ng/L  Urinalysis, Routine w reflex microscopic -Urine, Random     Status: Abnormal   Collection Time: 09/23/22  4:14 PM  Result Value Ref Range   Color, Urine YELLOW YELLOW   APPearance HAZY (A) CLEAR   Specific Gravity, Urine 1.025 1.005 - 1.030   pH 6.0 5.0 - 8.0   Glucose, UA >=500 (A) NEGATIVE mg/dL   Hgb urine dipstick MODERATE (A) NEGATIVE   Bilirubin Urine NEGATIVE NEGATIVE   Ketones, ur 5 (A) NEGATIVE mg/dL   Protein, ur 100 (A) NEGATIVE mg/dL   Nitrite NEGATIVE NEGATIVE   Leukocytes,Ua NEGATIVE NEGATIVE   RBC / HPF 0-5 0 - 5 RBC/hpf   WBC, UA 0-5 0 - 5 WBC/hpf   Bacteria, UA RARE (A) NONE SEEN   Squamous Epithelial / HPF 0-5 0 - 5 /HPF   Mucus PRESENT    Hyaline Casts, UA PRESENT    Granular Casts, UA PRESENT   I-Stat venous blood gas, ([facility], [facility], [facility])     Status: Abnormal   Collection Time: 09/23/22  8:26 PM  Result Value Ref Range   pH, Ven 7.374  7.25 - 7.43   pCO2, Ven 32.4 (L) 44 - 60 mmHg   pO2, Ven 79 (H) 32 - 45 mmHg   Bicarbonate 18.9 (L) 20.0 - 28.0 mmol/L   TCO2 20 (L) 22 - 32 mmol/L   O2 Saturation 96 %   Acid-base deficit 5.0 (H) 0.0 - 2.0 mmol/L   Sodium 141 135 - 145 mmol/L   Potassium 4.8 3.5 - 5.1 mmol/L   Calcium, Ion 0.99 (L) 1.15 - 1.40 mmol/L   HCT 36.0 (L) 39.0 - 52.0 %   Hemoglobin 12.2 (L) 13.0 - 17.0 g/dL   Sample type VENOUS   Lactic  acid, plasma     Status: Abnormal   Collection Time: 09/23/22 11:00 PM  Result Value Ref Range   Lactic Acid, Venous 2.1 (HH) 0.5 - 1.9 mmol/L  Ammonia     Status: None   Collection Time: 09/24/22  1:21 AM  Result Value Ref Range   Ammonia 21 9 - 35 umol/L  Salicylate level     Status: Abnormal   Collection Time: 09/24/22  1:21 AM  Result Value Ref Range   Salicylate Lvl [id number] (L) 7.0 - 30.0 mg/dL  Acetaminophen level     Status: Abnormal   Collection Time: 09/24/22  1:21 AM  Result Value Ref Range   Acetaminophen (Tylenol), Serum <10 (L) 10 - 30 ug/mL  Protime-INR     Status: Abnormal   Collection Time: 09/24/22  1:21 AM  Result Value Ref Range   Prothrombin Time 17.2 (H) 11.4 - 15.2 seconds   INR 1.4 (H) 0.8 - 1.2  Hepatitis panel, acute     Status: None   Collection Time: 09/24/22  1:21 AM  Result Value Ref Range   Hepatitis B Surface Ag NON REACTIVE NON REACTIVE   HCV Ab NON REACTIVE NON REACTIVE   Hep A IgM NON REACTIVE NON REACTIVE   Hep B C IgM NON REACTIVE NON REACTIVE  CBC     Status: Abnormal   Collection Time: 09/24/22  1:21 AM  Result Value Ref Range   WBC 3.0 (L) 4.0 - 10.5 K/uL   RBC 3.52 (L) 4.22 - 5.81 MIL/uL   Hemoglobin 10.7 (L) 13.0 - 17.0 g/dL   HCT 32.6 (L) 39.0 - 52.0 %   MCV 92.6 80.0 - 100.0 fL   MCH 30.4 26.0 - 34.0 pg   MCHC 32.8 30.0 - 36.0 g/dL   RDW 16.0 (H) 11.5 - 15.5 %   Platelets 59 (L) 150 - 400 K/uL   nRBC 0.0 0.0 - 0.2 %  Comprehensive metabolic panel     Status: Abnormal   Collection Time: 09/24/22  1:21 AM  Result  Value Ref Range   Sodium 141 135 - 145 mmol/L   Potassium 4.1 3.5 - 5.1 mmol/L   Chloride 113 (H) 98 - 111 mmol/L   CO2 20 (L) 22 - 32 mmol/L   Glucose, Bld 108 (H) 70 - 99 mg/dL   BUN 16 8 - 23 mg/dL   Creatinine, Ser 1.91 (H) 0.61 - 1.24 mg/dL   Calcium 7.7 (L) 8.9 - 10.3 mg/dL   Total Protein 5.9 (L) 6.5 - 8.1 g/dL   Albumin 2.1 (L) 3.5 - 5.0 g/dL   AST 44 (H) 15 - 41 U/L   ALT 37 0 - 44 U/L   Alkaline Phosphatase 120 38 - 126 U/L   Total Bilirubin 1.0 0.3 - 1.2 mg/dL   GFR, Estimated 34 (L) >60 mL/min   Anion gap 8 5 - 15  Lactic acid, plasma     Status: None   Collection Time: 09/24/22  1:21 AM  Result Value Ref Range   Lactic Acid, Venous 1.9 0.5 - 1.9 mmol/L    I have reviewed pertinent nursing notes, vitals, labs, and images as necessary. I have ordered labwork to follow up on as indicated.  I have reviewed the last notes from staff over past 24 hours. I have discussed patient's care plan and test results with nursing staff, CM/SW, and other staff as appropriate.  Time spent: Greater than 50% of the 55 minute visit was spent in counseling/coordination  of care for the patient as laid out in the A&P.   LOS: 0 days   Dwyane Dee, MD [facility] 09/24/2022, 12:44 PM

## 2022-09-24 NOTE — Assessment & Plan Note (Signed)
-   Suspect this is due to bone marrow suppression from underlying cirrhosis

## 2022-09-24 NOTE — Evaluation (Addendum)
Physical Therapy Evaluation Patient Details Name: Trevor Murphy MRN: [id number] DOB: 1935-01-25 Today's Date: 09/24/2022  History of Present Illness  Pt is an 88 y.o. male who presented 09/23/22 with bil lower extremity and abdominal swelling. Pt with cirrhosis of liver with ascites. PMH: blind L eye, SVT; PAF; HLD; HTN; ETOH abuse(quit in 2017); CVA (2013); stage 3 CKD; CAD; and chronic diastolic CHF   Clinical Impression  Pt presents with condition above and deficits mentioned below, see PT Problem List. PTA, he was mod I for functional mobility using a rollator, living alone in a ground floor apartment. He relied on his sister, son, and nephew to assist with providing meals, transportation, cleaning, and assisting with bathing and dressing daily. He reports he began to need assistance to transfer to stand a couple days PTA. Currently, pt is demonstrating deficits in overall strength, balance, power, and activity tolerance that place him at high risk for falls. He needed x3 attempts and modA to be successful in transferring to stand with UE support today. He was only able to ambulate a short distance in the room with a RW and minA before fatiguing as well. Pt would benefit from rehab at a facility prior to d/c home to obtain increased therapy frequency and facilitate quicker progress in increasing his independence while decreasing his burden of care. Recommending AIR at this time as he is highly motivated to participate and improve to return home, but if pt does not qualify then recommend SNF. If family is able and willing to provide the level of care pt currently needs, then it is possible for him to d/c home with HHPT. Will continue to follow acutely.     Recommendations for follow up therapy are one component of a multi-disciplinary discharge planning process, led by the attending physician.  Recommendations may be updated based on patient status, additional functional criteria and insurance  authorization.  Follow Up Recommendations Acute inpatient rehab (3hours/day) (if not, AIR then SNF)      Assistance Recommended at Discharge Frequent or constant Supervision/Assistance  Patient can return home with the following  A lot of help with walking and/or transfers;A lot of help with bathing/dressing/bathroom;Assistance with cooking/housework;Assist for transportation    Equipment Recommendations Rolling walker (2 wheels)  Recommendations for Other Services  Rehab consult    Functional Status Assessment Patient has had a recent decline in their functional status and demonstrates the ability to make significant improvements in function in a reasonable and predictable amount of time.     Precautions / Restrictions Precautions Precautions: Fall Restrictions Weight Bearing Restrictions: No      Mobility  Bed Mobility Overal bed mobility: Needs Assistance Bed Mobility: Rolling, Sidelying to Sit, Sit to Supine Rolling: Min guard Sidelying to sit: Min guard, HOB elevated   Sit to supine: Min guard, HOB elevated   General bed mobility comments: Extra time and cues to roll to side to sit up to avoid abdominal pain. Min guard for safety.    Transfers Overall transfer level: Needs assistance Equipment used: Rolling walker (2 wheels) Transfers: Sit to/from Stand Sit to Stand: Mod assist           General transfer comment: x2 attempts to stand from EOB to RW with lateral approach to assist pt by PT, but no success. Successful with anterior approach with pt pulling up on therapist and transitioning hands from PT to RW once standing, modA.    Ambulation/Gait Ambulation/Gait assistance: Min assist Gait Distance (Feet): 13 Feet  Assistive device: Rolling walker (2 wheels) Gait Pattern/deviations: Step-to pattern, Decreased step length - right, Decreased step length - left, Decreased stride length, Shuffle Gait velocity: reduced Gait velocity interpretation: <1.31 ft/sec,  indicative of household ambulator   General Gait Details: Pt with slow, small, shuffling steps anterior <> posterior at EOB for safety. MinA for stability, pt fatigues quickly.  Stairs            Wheelchair Mobility    Modified Rankin (Stroke Patients Only)       Balance Overall balance assessment: Needs assistance Sitting-balance support: No upper extremity supported, Feet supported Sitting balance-Leahy Scale: Good     Standing balance support: Bilateral upper extremity supported, During functional activity, Reliant on assistive device for balance Standing balance-Leahy Scale: Poor Standing balance comment: Reliant on RW and external physical assistance                             Pertinent Vitals/Pain Pain Assessment Pain Assessment: Faces Faces Pain Scale: Hurts little more Pain Location: abdomen Pain Descriptors / Indicators: Discomfort, Guarding Pain Intervention(s): Limited activity within patient's tolerance, Monitored during session, Repositioned    Home Living Family/patient expects to be discharged to:: Private residence Living Arrangements: Alone Available Help at Discharge: Family;Available 24 hours/day Type of Home: Apartment Home Access: Level entry       Home Layout: One level Home Equipment: Tub bench;Toilet riser;Grab bars - toilet;Grab bars - tub/shower;Wheelchair - Engineer, technical sales (4 wheels)      Prior Function Prior Level of Function : Needs assist             Mobility Comments: Mod I using rollator until a few days ago in which he began to need assistance to transfer to stand. x1 fall x2 days PTA and then x1 fall while at [facility] several months ago. ADLs Comments: Sister and son come daily to assist with providing meals, dressing and bathing pt, and cleaning. They also provide transportation. Nephew also there often to assist.     Hand Dominance        Extremity/Trunk Assessment   Upper Extremity  Assessment Upper Extremity Assessment: Defer to OT evaluation    Lower Extremity Assessment Lower Extremity Assessment: Generalized weakness (noted 1+ pitting edema bil)    Cervical / Trunk Assessment Cervical / Trunk Assessment: Normal  Communication   Communication: HOH  Cognition Arousal/Alertness: Awake/alert Behavior During Therapy: WFL for tasks assessed/performed Overall Cognitive Status: Within Functional Limits for tasks assessed                                          General Comments      Exercises     Assessment/Plan    PT Assessment Patient needs continued PT services  PT Problem List Decreased strength;Decreased activity tolerance;Decreased balance;Decreased mobility;Pain       PT Treatment Interventions DME instruction;Gait training;Functional mobility training;Therapeutic activities;Balance training;Therapeutic exercise;Neuromuscular re-education;Patient/family education    PT Goals (Current goals can be found in the Care Plan section)  Acute Rehab PT Goals Patient Stated Goal: to get stronger and not fall PT Goal Formulation: With patient Time For Goal Achievement: 10/08/22 Potential to Achieve Goals: Good    Frequency Min 3X/week     Co-evaluation               AM-PAC PT "6 Clicks" Mobility  Outcome Measure Help needed turning from your back to your side while in a flat bed without using bedrails?: A Little Help needed moving from lying on your back to sitting on the side of a flat bed without using bedrails?: A Little Help needed moving to and from a bed to a chair (including a wheelchair)?: A Lot Help needed standing up from a chair using your arms (e.g., wheelchair or bedside chair)?: A Lot Help needed to walk in hospital room?: Total Help needed climbing 3-5 steps with a railing? : Total 6 Click Score: 12    End of Session Equipment Utilized During Treatment: Gait belt Activity Tolerance: Patient tolerated  treatment well Patient left: in bed;with call bell/phone within reach;with bed alarm set   PT Visit Diagnosis: Unsteadiness on feet (R26.81);Other abnormalities of gait and mobility (R26.89);Muscle weakness (generalized) (M62.81);Repeated falls (R29.6);History of falling (Z91.81);Difficulty in walking, not elsewhere classified (R26.2);Pain Pain - part of body:  (abdomen)    Time: [id number] PT Time Calculation (min) (ACUTE ONLY): 21 min   Charges:   PT Evaluation $PT Eval Moderate Complexity: 1 Mod          Moishe Spice, PT, DPT Acute Rehabilitation Services  Office: [phone]   Orvan Falconer 09/24/2022, 12:44 PM

## 2022-09-24 NOTE — Hospital Course (Addendum)
[name] is an 88 yo male with PMH CKD 3, HTN, dCHF, PFO, cryptogenic stroke s/p ILR, PAF on eliquis, prior heavy etoh use (quit 2017). He presented with lower extremity edema, abdominal swelling, and generalized malaise. Abdominal ultrasound obtained in the ER showed large volume ascites and nodular liver consistent with cirrhosis.  Gallbladder sludge and possible 10 mm polyp noted.  He also had prior MRCP on 07/14/2022 which also showed extensive ascites and mesenteric edema along with paraesophageal varices and right gastric varices compatible with portal venous hypertension.  Due to these findings, GI was consulted on admission.  Patient was also ordered paracentesis for diagnostic and therapeutic purposes and started on Lasix/spironolactone.  GI was also consulted during hospitalization.

## 2022-09-24 NOTE — ED Notes (Signed)
Lactic acid of 2.1 reported to Dr Hurley Cisco, no new orders

## 2022-09-24 NOTE — Consult Note (Signed)
Reason for Consult: New onset decompensated cirrhosis Referring Physician: Triad Murphy  Trevor Murphy HPI: This is an 88 year old male with a PMH of pancytopenia, gastric ulcer (2022), HTN, CHF, PFO, CKD, CVA, PAF on Eliquis, and history of heavy ETOH abuse (quit in 2017) admitted for abdominal swelling and lower extremity edema.  The patient reports that his symptoms started 2-3 days ago, but the accuracy of his history is uncertain.  He was evaluated by Trevor Murphy, his PCP yesterday.  Per the records his sister reported weakness, lower extremity swelling, and abdominal swelling for the past month.  He had a fall earlier this week and he needed assistance.  In his PCP's office yesterday his weakness was so severe that he needed the assistance of a wheelchair.  Because of his medical condition, he was recommended to present to the ER.  Today he had 2.1 liters of ascites removed and he feels better.  Lasix 20 mg IV was provided for him three times and he reports significant diuresis.  Past Medical History:  Diagnosis Date   Arthritis    CAD (coronary artery disease)    a. Cath 08/2009: minimal nonobstructive disease (25% LAD). b. Lexiscan nuc 6/215: EF 54%, no ischemia or infarction.   Chronic diastolic CHF (congestive heart failure) ([place])    a. Echo 2013: EF 55-60%, mild AI, mild MR. b. Echo 5/15 with EF 60-65%, no WMA, mild LVH, mild AI.   CKD (chronic kidney disease) stage 3, GFR 30-59 ml/min (HCC) 07/26/2012   a. Felt likely due to HTN.   CVA (cerebral infarction) 07/28/2012   Glaucoma    Gout    H/O umbilical hernia repair    Headache(784.0)    " WHEN MY BLOOD PRESSURE IS UP"   History of acute renal failure    History of alcohol abuse    HTN (hypertension)    Hypertension    a. Patent renal arteries 2005.   Noncompliance    OA (osteoarthritis)    Other and unspecified hyperlipidemia    Poor circulation    Sinus bradycardia    SVT (supraventricular tachycardia)    a. Possible  AVNRT.  Got adenosine from EMS in 2015 for SVT.  During 5/15 hospitalization had episode of short R-P tachycardia (possible AVNRT) terminated by adenosine.  b. 04/2016 - recurrent tachycardia (SVT vs. AVNRT), again terminated by Adenosine   Visual impairment     Past Surgical History:  Procedure Laterality Date   APPENDECTOMY     BIOPSY  08/12/2020   Procedure: BIOPSY;  Surgeon: Trevor Craver, MD;  Location: [facility] ENDOSCOPY;  Service: Endoscopy;;   BUBBLE STUDY  05/24/2019   Procedure: BUBBLE STUDY;  Surgeon: Trevor Heinz, MD;  Location: [facility] ENDOSCOPY;  Service: Endoscopy;;   CARDIAC CATHETERIZATION  11/2003   EF 50-55%   CARDIAC CATHETERIZATION  08/2009   CATARACT EXTRACTION EXTRACAPSULAR Right 05/17/2013   Procedure: CATARACT EXTRACTION EXTRACAPSULAR WITH INTRAOCULAR LENS PLACEMENT ([place]) RIGHT EYE;  Surgeon: Trevor Pearson, MD;  Location: [place];  Service: Ophthalmology;  Laterality: Right;   COLONOSCOPY     ESOPHAGOGASTRODUODENOSCOPY (EGD) WITH PROPOFOL N/A 08/12/2020   Procedure: ESOPHAGOGASTRODUODENOSCOPY (EGD) WITH PROPOFOL;  Surgeon: Trevor Craver, MD;  Location: [facility] ENDOSCOPY;  Service: Endoscopy;  Laterality: N/A;   HERNIA REPAIR     LOOP RECORDER INSERTION N/A 05/24/2019   Procedure: LOOP RECORDER INSERTION;  Surgeon: Trevor Haw, MD;  Location: [place] [facility] LAB;  Service: Cardiovascular;  Laterality: N/A;   TEE  WITHOUT CARDIOVERSION N/A 05/24/2019   Procedure: TRANSESOPHAGEAL ECHOCARDIOGRAM (TEE);  Surgeon: Trevor Heinz, MD;  Location: [facility] ENDOSCOPY;  Service: Endoscopy;  Laterality: N/A;   VENTRAL HERNIA REPAIR  03/07/2012   Procedure: LAPAROSCOPIC VENTRAL HERNIA;  Surgeon: Trevor Ober, MD;  Location: [place];  Service: General;  Laterality: N/A;    Family History  Problem Relation Age of Onset   CVA Mother    Hypertension Brother    Hypertension Sister    Hypertension Brother    Stroke Sister    Heart attack Neg Hx     Social History:  reports that he  has never smoked. He has never used smokeless tobacco. He reports that he does not drink alcohol and does not use drugs.  Allergies: No Known Allergies  Medications: Scheduled:  atorvastatin  80 mg Oral q1800   brinzolamide  1 drop Both Eyes TID   And   brimonidine  1 drop Both Eyes TID   dapagliflozin propanediol  10 mg Oral QAC breakfast   ferrous sulfate  325 mg Oral Daily   finasteride  5 mg Oral Daily   furosemide  20 mg Intravenous Daily   latanoprost  1 drop Both Eyes QHS   lidocaine       spironolactone  50 mg Oral Daily   Continuous:  Results for orders placed or performed during the hospital encounter of 09/23/22 (from the past 24 hour(s))  Urinalysis, Routine w reflex microscopic -Urine, Random     Status: Abnormal   Collection Time: 09/23/22  4:14 PM  Result Value Ref Range   Color, Urine YELLOW YELLOW   APPearance HAZY (A) CLEAR   Specific Gravity, Urine 1.025 1.005 - 1.030   pH 6.0 5.0 - 8.0   Glucose, UA >=500 (A) NEGATIVE mg/dL   Hgb urine dipstick MODERATE (A) NEGATIVE   Bilirubin Urine NEGATIVE NEGATIVE   Ketones, ur 5 (A) NEGATIVE mg/dL   Protein, ur 100 (A) NEGATIVE mg/dL   Nitrite NEGATIVE NEGATIVE   Leukocytes,Ua NEGATIVE NEGATIVE   RBC / HPF 0-5 0 - 5 RBC/hpf   WBC, UA 0-5 0 - 5 WBC/hpf   Bacteria, UA RARE (A) NONE SEEN   Squamous Epithelial / HPF 0-5 0 - 5 /HPF   Mucus PRESENT    Hyaline Casts, UA PRESENT    Granular Casts, UA PRESENT   I-Stat venous blood gas, ([facility] ED, [facility], [facility])     Status: Abnormal   Collection Time: 09/23/22  8:26 PM  Result Value Ref Range   pH, Ven 7.374 7.25 - 7.43   pCO2, Ven 32.4 (L) 44 - 60 mmHg   pO2, Ven 79 (H) 32 - 45 mmHg   Bicarbonate 18.9 (L) 20.0 - 28.0 mmol/L   TCO2 20 (L) 22 - 32 mmol/L   O2 Saturation 96 %   Acid-base deficit 5.0 (H) 0.0 - 2.0 mmol/L   Sodium 141 135 - 145 mmol/L   Potassium 4.8 3.5 - 5.1 mmol/L   Calcium, Ion 0.99 (L) 1.15 - 1.40 mmol/L   HCT 36.0 (L) 39.0 - 52.0 %   Hemoglobin 12.2 (L)  13.0 - 17.0 g/dL   Sample type VENOUS   Lactic acid, plasma     Status: Abnormal   Collection Time: 09/23/22 11:00 PM  Result Value Ref Range   Lactic Acid, Venous 2.1 (HH) 0.5 - 1.9 mmol/L  Ammonia     Status: None   Collection Time: 09/24/22  1:21 AM  Result Value Ref  Range   Ammonia 21 9 - 35 umol/L  Salicylate level     Status: Abnormal   Collection Time: 09/24/22  1:21 AM  Result Value Ref Range   Salicylate Lvl [id number] (L) 7.0 - 30.0 mg/dL  Acetaminophen level     Status: Abnormal   Collection Time: 09/24/22  1:21 AM  Result Value Ref Range   Acetaminophen (Tylenol), Serum <10 (L) 10 - 30 ug/mL  Protime-INR     Status: Abnormal   Collection Time: 09/24/22  1:21 AM  Result Value Ref Range   Prothrombin Time 17.2 (H) 11.4 - 15.2 seconds   INR 1.4 (H) 0.8 - 1.2  Hepatitis panel, acute     Status: None   Collection Time: 09/24/22  1:21 AM  Result Value Ref Range   Hepatitis B Surface Ag NON REACTIVE NON REACTIVE   HCV Ab NON REACTIVE NON REACTIVE   Hep A IgM NON REACTIVE NON REACTIVE   Hep B C IgM NON REACTIVE NON REACTIVE  CBC     Status: Abnormal   Collection Time: 09/24/22  1:21 AM  Result Value Ref Range   WBC 3.0 (L) 4.0 - 10.5 K/uL   RBC 3.52 (L) 4.22 - 5.81 MIL/uL   Hemoglobin 10.7 (L) 13.0 - 17.0 g/dL   HCT 32.6 (L) 39.0 - 52.0 %   MCV 92.6 80.0 - 100.0 fL   MCH 30.4 26.0 - 34.0 pg   MCHC 32.8 30.0 - 36.0 g/dL   RDW 16.0 (H) 11.5 - 15.5 %   Platelets 59 (L) 150 - 400 K/uL   nRBC 0.0 0.0 - 0.2 %  Comprehensive metabolic panel     Status: Abnormal   Collection Time: 09/24/22  1:21 AM  Result Value Ref Range   Sodium 141 135 - 145 mmol/L   Potassium 4.1 3.5 - 5.1 mmol/L   Chloride 113 (H) 98 - 111 mmol/L   CO2 20 (L) 22 - 32 mmol/L   Glucose, Bld 108 (H) 70 - 99 mg/dL   BUN 16 8 - 23 mg/dL   Creatinine, Ser 1.91 (H) 0.61 - 1.24 mg/dL   Calcium 7.7 (L) 8.9 - 10.3 mg/dL   Total Protein 5.9 (L) 6.5 - 8.1 g/dL   Albumin 2.1 (L) 3.5 - 5.0 g/dL   AST 44 (H) 15 -  41 U/L   ALT 37 0 - 44 U/L   Alkaline Phosphatase 120 38 - 126 U/L   Total Bilirubin 1.0 0.3 - 1.2 mg/dL   GFR, Estimated 34 (L) >60 mL/min   Anion gap 8 5 - 15  Lactic acid, plasma     Status: None   Collection Time: 09/24/22  1:21 AM  Result Value Ref Range   Lactic Acid, Venous 1.9 0.5 - 1.9 mmol/L     US Abdomen Complete  Result Date: 09/23/2022 CLINICAL DATA:  Ascites EXAM: ABDOMEN ULTRASOUND COMPLETE COMPARISON:  MRI 07/14/2022, ultrasound 04/30/2022 FINDINGS: Gallbladder: No shadowing stones. Sludge within the gallbladder. Polyp versus tumefactive sludge measuring up to 10 mm. Upper normal wall thickness. Negative sonographic Murphy Common bile duct: Diameter: 2.6 mm Liver: Heterogenous slightly nodular appearing liver suspicious for cirrhosis. Portal vein is patent on color Doppler imaging with normal direction of blood flow towards the liver. IVC: No abnormality visualized. Pancreas: Poorly visualized due to bowel gas Spleen: Size and appearance within normal limits. Right Kidney: Length: 7 cm. Cortex is echogenic. Cyst at the upper pole measuring 2.5 cm, no imaging follow-up is recommended. No  hydronephrosis Left Kidney: Length: 7.8 cm. Echogenic cortex. No mass or hydronephrosis. Abdominal aorta: No aneurysm visualized. Other findings: Large volume of ascites. IMPRESSION: 1. Large volume of ascites. 2. Heterogenous slightly nodular liver suspicious for cirrhosis. 3. Sludge within the gallbladder. 10 mm polyp versus tumefactive sludge. 4. Small echogenic kidneys consistent with medical renal disease. Electronically Signed   By: Donavan Foil M.D.   On: 09/23/2022 18:42   DG Chest 2 View  Result Date: 09/23/2022 CLINICAL DATA:  congestive heart failure EXAM: CHEST - 2 VIEW COMPARISON:  10/11/2020 FINDINGS: The heart size and mediastinal contours are within normal limits. Both lungs are clear. The visualized skeletal structures are unremarkable except for degenerative changes of the spine  with a similar mild scoliosis. Bones are osteopenic. Cardiac loop recorder over the chest as before. Aorta atherosclerotic. IMPRESSION: No active cardiopulmonary disease. Electronically Signed   By: Jerilynn Mages.  Shick M.D.   On: 09/23/2022 11:36    ROS:  As stated above in the HPI otherwise negative.  Blood pressure (!) 142/76, pulse 76, temperature 98 F (36.7 C), temperature source Oral, resp. rate 18, height 5' 10"$  (1.778 m), weight 65.7 kg, SpO2 100 %.    PE: Gen: NAD, Alert and Oriented HEENT:  [place]/AT, EOMI Neck: Supple, no LAD Lungs: CTA Bilaterally CV: RRR without M/G/R ABD: Soft, + ascites, umbilical hernia - reducible, +BS Ext: No C/C/E, no asterixis  Assessment/Plan: 1) Cirrhosis - presumed to be ETOH even though he quit in 2017. 2) Thrombocytopenia. 3) Esophageal varices. 4) Increasing creatinine.   The patient has cirrhosis.  The onset of his thrombocytopenia was in 2022 and he was worked up by Hematology.  There is a long history of ETOH, but it is not clear if he stopped in 2017 or 15 years ago, per his report.  There is the possibility of metabolic disease as the source of his cirrhosis, however, it is felt to be low utility to perform extensive serologic work up or other causes of cirrhosis.  He is elderly, frail, and he has multiple medical problems.  The patient responded very well to diuresis and now his creatinine is increasing.  Plan: 1) 2 grams sodium diet. 2) Discontinue IV lasix for now. 3) Follow creatinine closely. 4) Maintain spironolactone. 5) Await ascitic fluid results.  Hopefully a SAAG can be calculated. 6) Outpatient EGD in the near future to assess variceal size.  Addalynn Kumari D 09/24/2022, 3:22 PM

## 2022-09-24 NOTE — Progress Notes (Signed)
New Admission Note:   Arrival Method: Arrived from [facility] ED via stretcher Mental Orientation: Alert and oriented x4 Telemetry: N/A Assessment: Completed Skin: Intact IV: NSL- Rt upper arm Pain: 0/10 Tubes: N/A Safety Measures: Safety Fall Prevention Plan has been discussed.  Admission: Completed 5MW Orientation: Patient has been oriented to the room, unit and staff.  Family: Son at bedside  Orders have been reviewed and implemented. Will continue to monitor the patient. Call light has been placed within reach and bed alarm has been activated.   Saajan Willmon [employer], RN-BC Phone number: [phone]

## 2022-09-24 NOTE — Progress Notes (Signed)
  Echocardiogram 2D Echocardiogram has been performed.  Bobbye Charleston 09/24/2022, 3:33 PM

## 2022-09-24 NOTE — Consult Note (Signed)
   [facility] CM Inpatient Consult   09/24/2022  SEMISI BIELA 1935-01-28 [id number]  [place] Organization [ACO] Patient: [employer] Medicare  Primary Care Provider:  [name], FNP with [facility]  *Alerted from [facility] team of patient's admission   Patient is currently active with [place] [facility] for chronic disease management services.  Patient has been engaged by a [facility] RNCM and Education officer, museum.  Our community based plan of care has focused on disease management and community resource support.  Current PT recommendations is for CIR verse SNF for transition of care.  Plan: Will follow up with Inpatient Transition Of Care [TOC] team member to make aware that [place] [facility] following for progression and post hospital needs, if appropriate.  Of note, [facility] Care Management services does not replace or interfere with any services that are needed or arranged by inpatient [facility] care management team.   For additional questions or referrals please contact:  Natividad Brood, RN BSN [place]  [phone] business mobile phone Toll free office [phone]  *[place]  [phone] Fax number: [phone] [email or web address]

## 2022-09-24 NOTE — Progress Notes (Signed)
Inpatient Rehab Admissions Coordinator Note:   Per PT recommendations patient was screened for CIR candidacy by Michel Santee, PT. At this time, pt appears to be a potential candidate for CIR. I will place an order for rehab consult for full assessment, per our protocol.  Please contact me any with questions.Shann Medal, PT, DPT [phone] 09/24/22 4:43 PM

## 2022-09-24 NOTE — Procedures (Signed)
PROCEDURE SUMMARY:  Successful US guided paracentesis from left lateral abdomen.  Yielded 2.1 liters of yellow fluid.  No immediate complications.  Pt tolerated well.   Specimen was sent for labs.   EBL < 80m  Of note, patient right AC IV was bloodied upon arrival to IR.  IR RN assessed and flushed.  IV functional, but bleeding with change in position.  Redressed, secured, and left in place as it is currently patient's only IV.  RN made aware.   KDocia BarrierPA-C 09/24/2022 2:38 PM

## 2022-09-25 LAB — CBC WITH DIFFERENTIAL/PLATELET
Abs Immature Granulocytes: 0.01 10*3/uL (ref 0.00–0.07)
Basophils Absolute: 0 10*3/uL (ref 0.0–0.1)
Basophils Relative: 0 %
Eosinophils Absolute: 0.1 10*3/uL (ref 0.0–0.5)
Eosinophils Relative: 5 %
HCT: 32.1 % — ABNORMAL LOW (ref 39.0–52.0)
Hemoglobin: 10.6 g/dL — ABNORMAL LOW (ref 13.0–17.0)
Immature Granulocytes: 0 %
Lymphocytes Relative: 16 %
Lymphs Abs: 0.5 10*3/uL — ABNORMAL LOW (ref 0.7–4.0)
MCH: 30.5 pg (ref 26.0–34.0)
MCHC: 33 g/dL (ref 30.0–36.0)
MCV: 92.2 fL (ref 80.0–100.0)
Monocytes Absolute: 0.5 10*3/uL (ref 0.1–1.0)
Monocytes Relative: 17 %
Neutro Abs: 1.7 10*3/uL (ref 1.7–7.7)
Neutrophils Relative %: 62 %
Platelets: 54 10*3/uL — ABNORMAL LOW (ref 150–400)
RBC: 3.48 MIL/uL — ABNORMAL LOW (ref 4.22–5.81)
RDW: 16.1 % — ABNORMAL HIGH (ref 11.5–15.5)
WBC: 2.8 10*3/uL — ABNORMAL LOW (ref 4.0–10.5)
nRBC: 0 % (ref 0.0–0.2)

## 2022-09-25 LAB — COMPREHENSIVE METABOLIC PANEL
ALT: 30 U/L (ref 0–44)
AST: 38 U/L (ref 15–41)
Albumin: 1.9 g/dL — ABNORMAL LOW (ref 3.5–5.0)
Alkaline Phosphatase: 106 U/L (ref 38–126)
Anion gap: 7 (ref 5–15)
BUN: 16 mg/dL (ref 8–23)
CO2: 20 mmol/L — ABNORMAL LOW (ref 22–32)
Calcium: 7.4 mg/dL — ABNORMAL LOW (ref 8.9–10.3)
Chloride: 111 mmol/L (ref 98–111)
Creatinine, Ser: 1.82 mg/dL — ABNORMAL HIGH (ref 0.61–1.24)
GFR, Estimated: 36 mL/min — ABNORMAL LOW (ref >60)
Glucose, Bld: 98 mg/dL (ref 70–99)
Potassium: 3.7 mmol/L (ref 3.5–5.1)
Sodium: 138 mmol/L (ref 135–145)
Total Bilirubin: 0.6 mg/dL (ref 0.3–1.2)
Total Protein: 5.3 g/dL — ABNORMAL LOW (ref 6.5–8.1)

## 2022-09-25 LAB — MAGNESIUM: Magnesium: 1.8 mg/dL (ref 1.7–2.4)

## 2022-09-25 MED ORDER — SPIRONOLACTONE 25 MG PO TABS
50.0000 mg | ORAL_TABLET | Freq: Once | ORAL | Status: AC
  Administered 2022-09-25: 50 mg via ORAL
  Filled 2022-09-25: qty 2

## 2022-09-25 MED ORDER — SPIRONOLACTONE 25 MG PO TABS
100.0000 mg | ORAL_TABLET | Freq: Every day | ORAL | Status: DC
  Administered 2022-09-26 – 2022-09-28 (×3): 100 mg via ORAL
  Filled 2022-09-25 (×3): qty 4

## 2022-09-25 NOTE — TOC Initial Note (Signed)
Transition of Care [facility]) - Initial/Assessment Note    Patient Details  Name: Trevor Murphy MRN: [id number] Date of Birth: 12/31/1934  Transition of Care [facility]) CM/SW Contact:    Trevor Murphy, Trevor Ee, RN Phone Number: 09/25/2022, 2:26 PM  Clinical Narrative:                  CM spoke with patient at bedside about needs for post hospital transition.  Admitted for Liver Cirrhosis with Ascites with lower extremity edema.. Had 2.1 liters of ascites removed by IR on 09/25/23. States he feel better. On Eliquis for A-fib. GI following. From home alone daytime. Has a nephew that spends the night. Has a son and a sister that are very supportive and assist with his care. His sister drives him to his appointments.  Has all necessary DME's at home.   PCP is Trevor Brine, Trevor Murphy and uses [facility] on [address].  Home Health referral called in to [facility] per patient's request as he had used their services in 2022 and [facility] noted acceptance, info on AVS. RW ordered from [employer], Erasmo Downer to deliver to patient at bedside. CM will continue to follow as patient progresses with care towards discharge.            Expected Discharge Plan: [place] Barriers to Discharge: Continued Medical Work up   Patient Goals and CMS Choice Patient states their goals for this hospitalization and ongoing recovery are:: To return home CMS Medicare.gov Compare Post Acute Care list provided to:: Patient Choice offered to / list presented to : Patient      Expected Discharge Plan and Services   Discharge Planning Services: CM Consult Post Acute Care Choice: [place] arrangements for the past 2 months: Apartment                           HH Arranged: PT, OT HH Agency: [place] Date [place]: 09/25/22 Time [place]: 44 Representative spoke with at [place]: Tommi Rumps  Prior Living Arrangements/Services Living arrangements for the  past 2 months: Apartment Lives with:: Self (Son, nephew and sister assist with care.) Patient language and need for interpreter reviewed:: Yes Do you feel safe going back to the place where you live?: Yes      Need for Family Participation in Patient Care: Yes (Comment) Care giver support system in place?: Yes (comment) Current home services: DME (All necessary DME's) Criminal Activity/Legal Involvement Pertinent to Current Situation/Hospitalization: No - Comment as needed  Activities of Daily Living Home Assistive Devices/Equipment: Dentures (specify type), Walker (specify type) ADL Screening (condition at time of admission) Patient's cognitive ability adequate to safely complete daily activities?: Yes Is the patient deaf or have difficulty hearing?: No Does the patient have difficulty seeing, even when wearing glasses/contacts?: No Does the patient have difficulty concentrating, remembering, or making decisions?: No Patient able to express need for assistance with ADLs?: Yes Does the patient have difficulty dressing or bathing?: No Independently performs ADLs?: No Communication: Independent Dressing (OT): Independent Grooming: Independent Feeding: Independent Bathing: Independent Toileting: Independent In/Out Bed: Independent Walks in Home: Independent with device (comment) Does the patient have difficulty walking or climbing stairs?: Yes Weakness of Legs: Both Weakness of Arms/Hands: None  Permission Sought/Granted Permission sought to share information with : Case Manager, Customer service manager, Family Supports Permission granted to share information with : Yes, Verbal Permission Granted  Emotional Assessment Appearance:: Appears stated age Attitude/Demeanor/Rapport: Engaged, Gracious Affect (typically observed): Accepting, Appropriate, Calm, Hopeful, Pleasant Orientation: : Oriented to Self, Oriented to Place, Oriented to  Time, Oriented to  Situation Alcohol / Substance Use: Not Applicable Psych Involvement: No (comment)  Admission diagnosis:  Renal insufficiency [N28.9] Other ascites [R18.8] Failure to thrive in adult [R62.7] Cirrhosis of liver with ascites (HCC) [K74.60, R18.8] Patient Active Problem List   Diagnosis Date Noted   Pancytopenia ([place]) 09/24/2022   Weakness 09/23/2022   Lower leg edema 09/23/2022   Abdomen enlarged 09/23/2022   Other ascites 09/23/2022   Inguinal bulge 09/23/2022   Hiatal hernia 09/23/2022   Cirrhosis of liver with ascites ([place]) 09/23/2022   Leg swelling 07/01/2022   Hematuria, gross 06/18/2022   Status post placement of implantable loop recorder 06/18/2022   History of CVA (cerebrovascular accident) 06/08/2022   Duodenal ulcer, unspecified as acute or chronic, without hemorrhage or perforation 01/09/2022   Iron deficiency anemia 01/09/2022   PVD (peripheral vascular disease) ([place]) 08/19/2021   Mixed hyperlipidemia 08/12/2020   Acute blood loss anemia 08/11/2020   Paroxysmal atrial fibrillation ([place]) 06/07/2020   Secondary hypercoagulable state ([place]) 06/07/2020   Ischemic cerebrovascular accident (CVA) of frontal lobe ([place]) 05/20/2019   Pain due to onychomycosis of toenails of both feet 01/20/2019   Prediabetes 07/22/2018   Nail abnormality 07/22/2018   Sinus bradycardia 05/02/2016   Hypotension due to drugs 05/02/2016   History of tachycardia 05/02/2016   GERD (gastroesophageal reflux disease) 02/05/2015   Coronary artery disease involving native coronary artery of native heart without angina pectoris 04/20/2014   Atypical chest pain 04/12/2014   Hypertensive urgency 03/11/2014   Chronic diastolic CHF (congestive heart failure) ([place]) 01/30/2014   Alcohol abuse 01/19/2014   Elevated troponin level not due myocardial infarction 12/19/2013   Gout 12/19/2013   Dyslipidemia 12/16/2013   Hypertensive crisis 12/16/2013   Cerebral infarction ([place]) 07/28/2012   Acute on chronic  renal failure ([place]) 07/27/2012   Essential hypertension 07/26/2012   Chronic kidney disease, stage 3b ([place]) 07/26/2012   Poor circulation    Recurrent ventral hernia 02/16/2012   Hernia (acquired) (recurrent) 02/10/2011   PCP:  Trevor Brine, Trevor Murphy Pharmacy:   [facility] DRUG STORE [place], [place] AT [address] [place] [address] Phone: [phone] Fax: [phone]     Social Determinants of Health (SDOH) Social History: SDOH Screenings   Food Insecurity: No Food Insecurity (09/24/2022)  Housing: Low Risk  (09/24/2022)  Transportation Needs: No Transportation Needs (09/24/2022)  Utilities: Not At Risk (09/24/2022)  Depression (PHQ2-9): Low Risk  (08/06/2022)  Financial Resource Strain: Low Risk  (09/17/2021)  Physical Activity: Inactive (09/17/2021)  Social Connections: Moderately Isolated (04/15/2021)  Stress: No Stress Concern Present (09/17/2021)  Tobacco Use: Low Risk  (09/25/2022)   SDOH Interventions: Transportation Interventions: Intervention Not Indicated, Inpatient TOC, Patient Resources (Friends/Family)   Readmission Risk Interventions     No data to display

## 2022-09-25 NOTE — Progress Notes (Signed)
Progress Note    BROCH PELCHER   [id number]  DOB: 10-30-1934  DOA: 09/23/2022     1 PCP: [name], FNP  Initial CC: swelling  Hospital Course: [name] is an 88 yo male with PMH CKD 3, HTN, dCHF, PFO, cryptogenic stroke s/p ILR, PAF on eliquis, prior heavy etoh use (quit 2017). He presented with lower extremity edema, abdominal swelling, and generalized malaise. Abdominal ultrasound obtained in the ER showed large volume ascites and nodular liver consistent with cirrhosis.  Gallbladder sludge and possible 10 mm polyp noted.  He also had prior MRCP on 07/14/2022 which also showed extensive ascites and mesenteric edema along with paraesophageal varices and right gastric varices compatible with portal venous hypertension.  Due to these findings, GI was consulted on admission.  Patient was also ordered paracentesis for diagnostic and therapeutic purposes and started on Lasix/spironolactone.  Interval History:  No events overnight. Did have some relief from paracentesis yesterday and ambulated better today with PT such that he is now recommended for home with [facility].    Assessment and Plan: * Cirrhosis of liver with ascites ([place]) - Maybe EtOH cirrhosis given PMH of EtOH abuse? "h/o heavy use, quit in 2017" - s/p paracentesis on 2/15 with 2.1 L fluid removed - GI following, appreciate assistance - continues on spironolactone; on hold is lasix  Chronic kidney disease, stage 3b ([place]) - patient has history of CKD3b. Baseline creat ~ 1.8, eGFR 37-42   Paroxysmal atrial fibrillation (HCC) H/o cryptogenic stroke, ILR placement, PAF found started on eliquis. Hold eliquis for the moment due to: Worsening thrombocytopenia over past 2 years presumably secondary to progression of cirrhosis and portal HTN Findings of portal HTN with esophageal and paraesophageal varicies demonstrated on MRI in Dec 2023. Will defer to GI wether its safe to restart the eliquis  Chronic diastolic CHF (congestive  heart failure) ([place]) - Last echo reviewed, October 2020: EF 55 to 60%, severe LVH, impaired relaxation -On Jardiance at home - Continue spironolactone; lasix on hold - BNP also elevated - echo repeated: EF 55-60%; mild LVH, indeterminate diastolic parameters; moderately severe to severe MR - will recommend outpatient cardiology followup  Pancytopenia ([place]) - Suspect this is due to bone marrow suppression from underlying cirrhosis  Inguinal bulge Reducible hernia. Follow up with gen surg as outpt.  Mixed hyperlipidemia Cont statin    Old records reviewed in assessment of this patient  Antimicrobials:   DVT prophylaxis:  SCDs Start: 09/23/22 2040   Code Status:   Code Status: Full Code  Mobility Assessment (last 72 hours)     Mobility Assessment     Row Name 09/25/22 1100 09/25/22 1000 09/24/22 2150 09/24/22 1100 09/24/22 0800   Does patient have an order for bedrest or is patient medically unstable -- -- No - Continue assessment -- No - Continue assessment   What is the highest level of mobility based on the progressive mobility assessment? Level 5 (Walks with assist in room/hall) - Balance while stepping forward/back and can walk in room with assist - Complete Level 4 (Walks with assist in room) - Balance while marching in place and cannot step forward and back - Complete Level 4 (Walks with assist in room) - Balance while marching in place and cannot step forward and back - Complete Level 4 (Walks with assist in room) - Balance while marching in place and cannot step forward and back - Complete Level 4 (Walks with assist in room) - Balance while marching in  place and cannot step forward and back - Complete    Row Name 09/24/22 0046           Does patient have an order for bedrest or is patient medically unstable No - Continue assessment       What is the highest level of mobility based on the progressive mobility assessment? Level 4 (Walks with assist in room) - Balance  while marching in place and cannot step forward and back - Complete                Barriers to discharge:  Disposition Plan:  Home 1-2 days Status is: Inpt  Objective: Blood pressure (!) 167/97, pulse 91, temperature 98.3 F (36.8 C), temperature source Oral, resp. rate 18, height 5' 10"$  (1.778 m), weight 65.7 kg, SpO2 98 %.  Examination: GI Physical Exam Constitutional:      General: He is not in acute distress.    Appearance: Normal appearance.  HENT:     Head: Normocephalic and atraumatic.     Mouth/Throat:     Mouth: Mucous membranes are moist.  Eyes:     Extraocular Movements: Extraocular movements intact.  Cardiovascular:     Rate and Rhythm: Normal rate.  Pulmonary:     Effort: Pulmonary effort is normal. No respiratory distress.     Breath sounds: Normal breath sounds. No wheezing.  Abdominal:     General: Bowel sounds are normal. There is distension.     Palpations: Abdomen is soft.     Tenderness: There is no abdominal tenderness.  Musculoskeletal:        General: Swelling present. Normal range of motion.     Cervical back: Normal range of motion and neck supple.     Comments: 1-2 + B/L LE edema  Skin:    General: Skin is warm and dry.  Neurological:     General: No focal deficit present.     Mental Status: He is alert.  Psychiatric:        Mood and Affect: Mood normal.      Consultants:  GI  Procedures:  09/24/22: Paracentesis   Data Reviewed: Results for orders placed or performed during the hospital encounter of 09/23/22 (from the past 24 hour(s))  Albumin, pleural or peritoneal fluid      Status: None   Collection Time: 09/24/22  4:01 PM  Result Value Ref Range   Albumin, Fluid <1.5 g/dL   Fluid Type-FALB PERITONEAL CAVITY   CBC with Differential/Platelet     Status: Abnormal   Collection Time: 09/25/22  3:23 AM  Result Value Ref Range   WBC 2.8 (L) 4.0 - 10.5 K/uL   RBC 3.48 (L) 4.22 - 5.81 MIL/uL   Hemoglobin 10.6 (L) 13.0 - 17.0 g/dL    HCT 32.1 (L) 39.0 - 52.0 %   MCV 92.2 80.0 - 100.0 fL   MCH 30.5 26.0 - 34.0 pg   MCHC 33.0 30.0 - 36.0 g/dL   RDW 16.1 (H) 11.5 - 15.5 %   Platelets 54 (L) 150 - 400 K/uL   nRBC 0.0 0.0 - 0.2 %   Neutrophils Relative % 62 %   Neutro Abs 1.7 1.7 - 7.7 K/uL   Lymphocytes Relative 16 %   Lymphs Abs 0.5 (L) 0.7 - 4.0 K/uL   Monocytes Relative 17 %   Monocytes Absolute 0.5 0.1 - 1.0 K/uL   Eosinophils Relative 5 %   Eosinophils Absolute 0.1 0.0 - 0.5 K/uL   Basophils  Relative 0 %   Basophils Absolute 0.0 0.0 - 0.1 K/uL   Immature Granulocytes 0 %   Abs Immature Granulocytes 0.01 0.00 - 0.07 K/uL  Comprehensive metabolic panel     Status: Abnormal   Collection Time: 09/25/22  3:23 AM  Result Value Ref Range   Sodium 138 135 - 145 mmol/L   Potassium 3.7 3.5 - 5.1 mmol/L   Chloride 111 98 - 111 mmol/L   CO2 20 (L) 22 - 32 mmol/L   Glucose, Bld 98 70 - 99 mg/dL   BUN 16 8 - 23 mg/dL   Creatinine, Ser 1.82 (H) 0.61 - 1.24 mg/dL   Calcium 7.4 (L) 8.9 - 10.3 mg/dL   Total Protein 5.3 (L) 6.5 - 8.1 g/dL   Albumin 1.9 (L) 3.5 - 5.0 g/dL   AST 38 15 - 41 U/L   ALT 30 0 - 44 U/L   Alkaline Phosphatase 106 38 - 126 U/L   Total Bilirubin 0.6 0.3 - 1.2 mg/dL   GFR, Estimated 36 (L) >60 mL/min   Anion gap 7 5 - 15  Magnesium     Status: None   Collection Time: 09/25/22  3:23 AM  Result Value Ref Range   Magnesium 1.8 1.7 - 2.4 mg/dL    I have reviewed pertinent nursing notes, vitals, labs, and images as necessary. I have ordered labwork to follow up on as indicated.  I have reviewed the last notes from staff over past 24 hours. I have discussed patient's care plan and test results with nursing staff, CM/SW, and other staff as appropriate.  Time spent: Greater than 50% of the 55 minute visit was spent in counseling/coordination of care for the patient as laid out in the A&P.   LOS: 1 day   Dwyane Dee, MD [facility] 09/25/2022, 2:48 PM

## 2022-09-25 NOTE — TOC Progression Note (Signed)
Transition of Care [facility]) - Initial/Assessment Note    Patient Details  Name: Trevor Murphy MRN: [id number] Date of Birth: 06-09-1935  Transition [facility]) CM/SW Contact:    Milinda Antis, [place] Phone Number: 09/25/2022, 10:43 AM  Clinical Narrative:                 PT is recommending CIR and per rehab admissions the patient is a potential candidate.  TOC will continue to follow patient for any d/c planning needs once medically stable.  Lind Covert, MSW, LCSW        Patient Goals and CMS Choice            Expected Discharge Plan and Services                                              Prior Living Arrangements/Services                       Activities of Daily Living Home Assistive Devices/Equipment: Dentures (specify type), Walker (specify type) ADL Screening (condition at time of admission) Patient's cognitive ability adequate to safely complete daily activities?: Yes Is the patient deaf or have difficulty hearing?: No Does the patient have difficulty seeing, even when wearing glasses/contacts?: No Does the patient have difficulty concentrating, remembering, or making decisions?: No Patient able to express need for assistance with ADLs?: Yes Does the patient have difficulty dressing or bathing?: No Independently performs ADLs?: No Communication: Independent Dressing (OT): Independent Grooming: Independent Feeding: Independent Bathing: Independent Toileting: Independent In/Out Bed: Independent Walks in Home: Independent with device (comment) Does the patient have difficulty walking or climbing stairs?: Yes Weakness of Legs: Both Weakness of Arms/Hands: None  Permission Sought/Granted                  Emotional Assessment              Admission diagnosis:  Renal insufficiency [N28.9] Other ascites [R18.8] Failure to thrive in adult [R62.7] Cirrhosis of liver with ascites ([place]) [K74.60, R18.8] Patient Active  Problem List   Diagnosis Date Noted   Pancytopenia ([place]) 09/24/2022   Weakness 09/23/2022   Lower leg edema 09/23/2022   Abdomen enlarged 09/23/2022   Other ascites 09/23/2022   Inguinal bulge 09/23/2022   Hiatal hernia 09/23/2022   Cirrhosis of liver with ascites ([place]) 09/23/2022   Leg swelling 07/01/2022   Hematuria, gross 06/18/2022   Status post placement of implantable loop recorder 06/18/2022   History of CVA (cerebrovascular accident) 06/08/2022   Duodenal ulcer, unspecified as acute or chronic, without hemorrhage or perforation 01/09/2022   Iron deficiency anemia 01/09/2022   PVD (peripheral vascular disease) ([place]) 08/19/2021   Mixed hyperlipidemia 08/12/2020   Acute blood loss anemia 08/11/2020   Paroxysmal atrial fibrillation ([place]) 06/07/2020   Secondary hypercoagulable state ([place]) 06/07/2020   Ischemic cerebrovascular accident (CVA) of frontal lobe ([place]) 05/20/2019   Pain due to onychomycosis of toenails of both feet 01/20/2019   Prediabetes 07/22/2018   Nail abnormality 07/22/2018   Sinus bradycardia 05/02/2016   Hypotension due to drugs 05/02/2016   History of tachycardia 05/02/2016   GERD (gastroesophageal reflux disease) 02/05/2015   Coronary artery disease involving native coronary artery of native heart without angina pectoris 04/20/2014   Atypical chest pain 04/12/2014   Hypertensive urgency 03/11/2014   Chronic diastolic  CHF (congestive heart failure) ([place]) 01/30/2014   Alcohol abuse 01/19/2014   Elevated troponin level not due myocardial infarction 12/19/2013   Gout 12/19/2013   Dyslipidemia 12/16/2013   Hypertensive crisis 12/16/2013   Cerebral infarction ([place]) 07/28/2012   Acute on chronic renal failure ([place]) 07/27/2012   Essential hypertension 07/26/2012   Chronic kidney disease, stage 3b ([place]) 07/26/2012   Poor circulation    Recurrent ventral hernia 02/16/2012   Hernia (acquired) (recurrent) 02/10/2011   PCP:  [name], [place] Pharmacy:    [facility] DR[facility] STORE [place], [address] [place] [address] Phone: [phone] Fax: [phone]     Social Determinants of Health (SDOH) Social History: SDOH Screenings   Food Insecurity: No Food Insecurity (09/24/2022)  Housing: Low Risk  (09/24/2022)  Transportation Needs: No Transportation Needs (09/24/2022)  Utilities: Not At Risk (09/24/2022)  Depression (PHQ2-9): Low Risk  (08/06/2022)  Financial Resource Strain: Low Risk  (09/17/2021)  Physical Activity: Inactive (09/17/2021)  Social Connections: Moderately Isolated (04/15/2021)  Stress: No Stress Concern Present (09/17/2021)  Tobacco Use: Low Risk  (09/25/2022)   SDOH Interventions:     Readmission Risk Interventions     No data to display

## 2022-09-25 NOTE — Progress Notes (Signed)
  Inpatient Rehabilitation Admissions Coordinator   Met with patient  at bedside for rehab assessment. I also spoke with son by phone. He has a nephew, Nicole Kindred, who is with him at night as well as sister and son in and out all day. They prefer home with [facility] and do not feel further rehab is needed.  Please call me with any questions.   Danne Baxter, RN, MSN Rehab Admissions Coordinator [phone]

## 2022-09-25 NOTE — Progress Notes (Signed)
Subjective: Trevor Murphy complains about some abdominal discomfort.  Objective: Vital signs in last 24 hours: Temp:  [97.4 F (36.3 C)-98.3 F (36.8 C)] 98.3 F (36.8 C) (02/16 1000) Pulse Rate:  [71-100] 91 (02/16 1000) Resp:  [18] 18 (02/16 0429) BP: (119-167)/(71-97) 167/97 (02/16 1000) SpO2:  [89 %-100 %] 89 % (02/16 1000) Last BM Date : 09/23/22  Intake/Output from previous day: 02/15 0701 - 02/16 0700 In: 840 [P.O.:840] Out: 2125 [Urine:2125] Intake/Output this shift: No intake/output data recorded.  General appearance: alert, no distress, and very frail and unsteady GI: soft distended with ascites, moderately-sized ventral hernia Ext: No edema  Lab Results: Recent Labs    09/23/22 1056 09/23/22 2026 09/24/22 0121 09/25/22 0323  WBC 4.1  --  3.0* 2.8*  HGB 12.9* 12.2* 10.7* 10.6*  HCT 42.3 36.0* 32.6* 32.1*  PLT 88*  --  59* 54*   BMET Recent Labs    09/23/22 1056 09/23/22 2026 09/24/22 0121 09/25/22 0323  NA 139 141 141 138  K 3.7 4.8 4.1 3.7  CL 114*  --  113* 111  CO2 13*  --  20* 20*  GLUCOSE 99  --  108* 98  BUN 15  --  16 16  CREATININE 1.65*  --  1.91* 1.82*  CALCIUM 7.8*  --  7.7* 7.4*   LFT Recent Labs    09/25/22 0323  PROT 5.3*  ALBUMIN 1.9*  AST 38  ALT 30  ALKPHOS 106  BILITOT 0.6   PT/INR Recent Labs    09/24/22 0121  LABPROT 17.2*  INR 1.4*   Hepatitis Panel Recent Labs    09/24/22 0121  HEPBSAG NON REACTIVE  HCVAB NON REACTIVE  HEPAIGM NON REACTIVE  HEPBIGM NON REACTIVE   C-Diff No results for input(s): "CDIFFTOX" in the last 72 hours. Fecal Lactopherrin No results for input(s): "FECLLACTOFRN" in the last 72 hours.  Studies/Results: IR Paracentesis  Result Date: 09/25/2022 INDICATION: 88 year old male with abdominal distention, ascites. Request made for diagnostic and therapeutic paracentesis. EXAM: ULTRASOUND GUIDED DIAGNOSTIC AND THERAPEUTIC PARACENTESIS MEDICATIONS: 10 mL 1% lidocaine COMPLICATIONS: None  immediate. PROCEDURE: Informed written consent was obtained from the patient after a discussion of the risks, benefits and alternatives to treatment. A timeout was performed prior to the initiation of the procedure. Initial ultrasound scanning demonstrates a moderate amount of ascites within the left lateral abdomen. The left lateral abdomen was prepped and draped in the usual sterile fashion. 1% lidocaine was used for local anesthesia. Following this, a 19 gauge, 7-cm, Yueh catheter was introduced. An ultrasound image was saved for documentation purposes. The paracentesis was performed. The catheter was removed and a dressing was applied. The patient tolerated the procedure well without immediate post procedural complication. FINDINGS: A total of approximately 2.1 liters of clear, yellow fluid was removed. Samples were sent to the laboratory as requested by the clinical team. IMPRESSION: Successful ultrasound-guided paracentesis yielding 2.1 liters of peritoneal fluid. Read by: Brynda Greathouse PA-C Electronically Signed   By: Corrie Mckusick D.O.   On: 09/25/2022 10:15   ECHOCARDIOGRAM COMPLETE  Result Date: 09/24/2022    ECHOCARDIOGRAM REPORT   Patient Name:   Trevor Murphy Date of Exam: 09/24/2022 Medical Rec #:  [id number]    Height:       70.0 in Accession #:    [id number]   Weight:       144.8 lb Date of Birth:  01-Nov-1934    BSA:  1.820 m Patient Age:    88 years     BP:           142/76 mmHg Patient Gender: M            HR:           48 bpm. Exam Location:  Inpatient Procedure: 2D Echo, 3D Echo, Cardiac Doppler and Color Doppler Indications:    I50.40* Unspecified combined systolic (congestive) and diastolic                 (congestive) heart failure  History:        Patient has prior history of Echocardiogram examinations, most                 recent 05/24/2019. CHF, CAD, Abnormal ECG, Stroke,                 Arrythmias:Bradycardia, Atrial Fibrillation and Tachycardia,                  Signs/Symptoms:Edema; Risk Factors:Hypertension and                 Dyslipidemia. ETOH. PFO.  Sonographer:    Roseanna Rainbow RDCS Referring Phys: [place]  Sonographer Comments: Study delayed for patient care. IMPRESSIONS  1. Proximal septal thickening, Basal inferior hypokinesis. Left ventricular ejection fraction, by estimation, is 55 to 60%. The left ventricle has normal function. There is mild left ventricular hypertrophy. Left ventricular diastolic parameters are indeterminate.  2. Right ventricular systolic function is normal. The right ventricular size is normal.  3. MR is somewhat eccentric. It appears at least moderately severe to severe. Consider TEE to further evaluate mechanism/severity. The mitral valve is normal in structure.  4. The aortic valve is tricuspid. Aortic valve regurgitation is moderate. Aortic valve sclerosis is present, with no evidence of aortic valve stenosis. FINDINGS  Left Ventricle: Proximal septal thickening, Basal inferior hypokinesis. Left ventricular ejection fraction, by estimation, is 55 to 60%. The left ventricle has normal function. The left ventricular internal cavity size was normal in size. There is mild left ventricular hypertrophy. Left ventricular diastolic parameters are indeterminate. Right Ventricle: The right ventricular size is normal. Right vetricular wall thickness was not assessed. Right ventricular systolic function is normal. Left Atrium: Left atrial size was normal in size. Right Atrium: Right atrial size was normal in size. Pericardium: Trivial pericardial effusion is present. Mitral Valve: MR is somewhat eccentric. It appears at least moderately severe to severe. Consider TEE to further evaluate mechanism/severity. The mitral valve is normal in structure. There is mild thickening of the mitral valve leaflet(s). Mild mitral annular calcification. Tricuspid Valve: The tricuspid valve is normal in structure. Tricuspid valve regurgitation is mild. Aortic  Valve: The aortic valve is tricuspid. Aortic valve regurgitation is moderate. Aortic regurgitation PHT measures 878 msec. Aortic valve sclerosis is present, with no evidence of aortic valve stenosis. Pulmonic Valve: The pulmonic valve was thickened with good excursion. Pulmonic valve regurgitation is mild. Aorta: The aortic root and ascending aorta are structurally normal, with no evidence of dilitation. IAS/Shunts: No atrial level shunt detected by color flow Doppler.  LEFT VENTRICLE PLAX 2D LVIDd:         4.60 cm      Diastology LVIDs:         3.50 cm      LV e' medial:    5.66 cm/s LV PW:         1.30 cm  LV E/e' medial:  13.1 LV IVS:        1.30 cm      LV e' lateral:   5.11 cm/s LVOT diam:     2.60 cm      LV E/e' lateral: 14.5 LV SV:         102 LV SV Index:   56 LVOT Area:     5.31 cm                              3D Volume EF: LV Volumes (MOD)            3D EF:        63 % LV vol d, MOD A4C: 114.0 ml LV EDV:       132 ml LV vol s, MOD A2C: 37.6 ml  LV ESV:       48 ml LV vol s, MOD A4C: 69.1 ml  LV SV:        83 ml LV SV MOD A4C:     114.0 ml RIGHT VENTRICLE             IVC RV S prime:     12.10 cm/s  IVC diam: 1.70 cm TAPSE (M-mode): 1.8 cm LEFT ATRIUM             Index        RIGHT ATRIUM           Index LA diam:        3.40 cm 1.87 cm/m   RA Area:     10.00 cm LA Vol (A2C):   24.3 ml 13.35 ml/m  RA Volume:   17.70 ml  9.73 ml/m LA Vol (A4C):   40.2 ml 22.09 ml/m LA Biplane Vol: 35.6 ml 19.56 ml/m  AORTIC VALVE             PULMONIC VALVE LVOT Vmax:   69.50 cm/s  PR End Diast Vel: 2.01 msec LVOT Vmean:  44.800 cm/s LVOT VTI:    0.192 m AI PHT:      878 msec  AORTA Ao Root diam: 4.05 cm Ao Asc diam:  3.90 cm MITRAL VALVE                  TRICUSPID VALVE MV Area (PHT): 2.16 cm       TR Peak grad:   32.5 mmHg MV Decel Time: 351 msec       TR Vmax:        285.00 cm/s MR Peak grad:    111.1 mmHg MR Mean grad:    70.0 mmHg    SHUNTS MR Vmax:         527.00 cm/s  Systemic VTI:  0.19 m MR Vmean:         385.0 cm/s   Systemic Diam: 2.60 cm MR PISA:         3.08 cm MR PISA Eff ROA: 22 mm MR PISA Radius:  0.70 cm MV E velocity: 74.25 cm/s MV A velocity: 71.10 cm/s MV E/A ratio:  1.04 Dorris Carnes MD Electronically signed by Dorris Carnes MD Signature Date/Time: 09/24/2022/5:36:28 PM    Final    US Abdomen Complete  Result Date: 09/23/2022 CLINICAL DATA:  Ascites EXAM: ABDOMEN ULTRASOUND COMPLETE COMPARISON:  MRI 07/14/2022, ultrasound 04/30/2022 FINDINGS: Gallbladder: No shadowing stones. Sludge within the gallbladder. Polyp versus tumefactive sludge measuring up to 10 mm. Upper normal wall thickness.  Negative sonographic Murphy Common bile duct: Diameter: 2.6 mm Liver: Heterogenous slightly nodular appearing liver suspicious for cirrhosis. Portal vein is patent on color Doppler imaging with normal direction of blood flow towards the liver. IVC: No abnormality visualized. Pancreas: Poorly visualized due to bowel gas Spleen: Size and appearance within normal limits. Right Kidney: Length: 7 cm. Cortex is echogenic. Cyst at the upper pole measuring 2.5 cm, no imaging follow-up is recommended. No hydronephrosis Left Kidney: Length: 7.8 cm. Echogenic cortex. No mass or hydronephrosis. Abdominal aorta: No aneurysm visualized. Other findings: Large volume of ascites. IMPRESSION: 1. Large volume of ascites. 2. Heterogenous slightly nodular liver suspicious for cirrhosis. 3. Sludge within the gallbladder. 10 mm polyp versus tumefactive sludge. 4. Small echogenic kidneys consistent with medical renal disease. Electronically Signed   By: Donavan Foil M.D.   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  MELD 3.0 - 17. 2) Ascites. 3) Ventral hernia. 4) Weakness/sarcopenia. 5) Renal insufficiency.   The patient is clinically stable.  There may be evidence that Trevor Murphy is reaccumulating ascitic fluid.  It is difficult to discern as Trevor Murphy was sitting in a chair today compared to be examined in the bed yesterday.  Trevor Murphy complains about focal abdominal pain a the ventral hernia site, but there was no pain with palpation.  There is no evidence of SBP.  The SAAG was not able to be calculated as the fluid albumin cannot be further differentiated <1.5, ie reported as 0.3 or 0.2, etc.  It is unknown if his SAAG is >1.1 or <1.1, but it is presumed to be >1.1.  Again, the utility of performing further serologic work up in this particular patient is of highly questionable value.  If desired, the work up can be performed as an outpatient by Dr. Collene Mares.  The patient is very frail and Trevor Murphy has significant sarcopenia, which are poor prognostic indicators.  Plan: 1) Daily weights.  If weight continues to increase Trevor Murphy may benefit from a repeat paracentesis with albumin. 2) Maintain 2 gram sodium diet. 3) Increase spironolactone to 100 mg. 4) Continue to hold lasix. 5) Follow creatinine.  LOS: 1 day   Jenniger Figiel D 09/25/2022, 12:40 PM

## 2022-09-25 NOTE — Progress Notes (Signed)
Physical Therapy Treatment Patient Details Name: Trevor Murphy MRN: [id number] DOB: [birth year] Today's Date: 09/25/2022   History of Present Illness Pt is an 88 y.o. male who presented 09/23/22 with bil lower extremity and abdominal swelling. Pt with cirrhosis of liver with ascites. PMH: blind L eye, SVT; PAF; HLD; HTN; ETOH abuse(quit in 2017); CVA (2013); stage 3 CKD; CAD; and chronic diastolic CHF    PT Comments    Great progress. Reviewed transfer training from various surfaces. Supervision for safety, improved techniques, no physical assist required today. Gait training with RW, cues for proximity to device, supervision for safety. Pt eager to return home, family confident they can provide care. Spoke with sister over the phone. Team updated. Will continue to follow and progress independence until d/c.   Recommendations for follow up therapy are one component of a multi-disciplinary discharge planning process, led by the attending physician.  Recommendations may be updated based on patient status, additional functional criteria and insurance authorization.  Follow Up Recommendations  Home health PT     Assistance Recommended at Discharge Intermittent Supervision/Assistance  Patient can return home with the following A little help with walking and/or transfers;A little help with bathing/dressing/bathroom;Assistance with cooking/housework;Assist for transportation   Equipment Recommendations  Rolling walker (2 wheels)    Recommendations for Other Services       Precautions / Restrictions Precautions Precautions: Fall Precaution Comments: low vision Restrictions Weight Bearing Restrictions: No     Mobility  Bed Mobility               General bed mobility comments: sitting in recliner    Transfers Overall transfer level: Needs assistance Equipment used: Rolling walker (2 wheels) Transfers: Sit to/from Stand Sit to Stand: Supervision           General transfer  comment: supervision for safety. Educated on improved techniques to rise from various surfaces. Performed at a supervision level. Cues for anterior lean over RW prior to pressing up enabled adequate weight shift to prevent posterior lean which was hindering pt initially. Able to demonstrate back from recliner and bed.    Ambulation/Gait Ambulation/Gait assistance: Supervision Gait Distance (Feet): 110 Feet Assistive device: Rolling walker (2 wheels) Gait Pattern/deviations: Step-through pattern, Decreased stride length, Trunk flexed Gait velocity: reduced Gait velocity interpretation: <1.31 ft/sec, indicative of household ambulator   General Gait Details: Supervision for safety. Cues for RW placement to proximity and upright posture. Maintained for short periods of time before leaning forward again. Cues with turns to remain closer to RW and within BOS of device. No buckling or LOB noted.   Stairs             Wheelchair Mobility    Modified Rankin (Stroke Patients Only)       Balance Overall balance assessment: Needs assistance Sitting-balance support: No upper extremity supported Sitting balance-Leahy Scale: Good     Standing balance support: Bilateral upper extremity supported, During functional activity, Reliant on assistive device for balance Standing balance-Leahy Scale: Poor Standing balance comment: Stable with RW for support.                            Cognition Arousal/Alertness: Awake/alert Behavior During Therapy: WFL for tasks assessed/performed Overall Cognitive Status: No family/caregiver present to determine baseline cognitive functioning  Exercises      General Comments General comments (skin integrity, edema, etc.): Spoke with pt and family in regards to d/c planning. He is notably improved today and family feel confident caring for him at a supervision level, even if he needs some  physical assist at times. Team updated on progress and pt dispo wishes      Pertinent Vitals/Pain Pain Assessment Pain Assessment: Faces Faces Pain Scale: Hurts a little bit Pain Location: abdomen Pain Descriptors / Indicators: Discomfort Pain Intervention(s): Monitored during session    Home Living Family/patient expects to be discharged to:: Private residence Living Arrangements: Alone Available Help at Discharge: Family;Available 24 hours/day Type of Home: Apartment Home Access: Level entry       Home Layout: One level Home Equipment: Tub bench;Toilet riser;Grab bars - toilet;Grab bars - tub/shower;Wheelchair - Engineer, technical sales (4 wheels)      Prior Function            PT Goals (current goals can now be found in the care plan section) Acute Rehab PT Goals Patient Stated Goal: to get stronger and not fall PT Goal Formulation: With patient Time For Goal Achievement: 10/08/22 Potential to Achieve Goals: Good Progress towards PT goals: Progressing toward goals    Frequency    Min 3X/week      PT Plan Discharge plan needs to be updated    Co-evaluation              AM-PAC PT "6 Clicks" Mobility   Outcome Measure  Help needed turning from your back to your side while in a flat bed without using bedrails?: A Little Help needed moving from lying on your back to sitting on the side of a flat bed without using bedrails?: A Little Help needed moving to and from a bed to a chair (including a wheelchair)?: A Little Help needed standing up from a chair using your arms (e.g., wheelchair or bedside chair)?: A Little Help needed to walk in hospital room?: A Little Help needed climbing 3-5 steps with a railing? : A Lot 6 Click Score: 17    End of Session Equipment Utilized During Treatment: Gait belt Activity Tolerance: Patient tolerated treatment well Patient left: in chair;with call bell/phone within reach Nurse Communication: Mobility status PT Visit  Diagnosis: Unsteadiness on feet (R26.81);Other abnormalities of gait and mobility (R26.89);Muscle weakness (generalized) (M62.81);Repeated falls (R29.6);History of falling (Z91.81);Difficulty in walking, not elsewhere classified (R26.2);Pain Pain - part of body:  (abdomen)     Time: [id number] PT Time Calculation (min) (ACUTE ONLY): 20 min  Charges:  $Therapeutic Activity: 8-22 mins                     Candie Mile, PT, DPT Physical Therapist [facility] [place]    Ellouise Newer 09/25/2022, 11:38 AM

## 2022-09-25 NOTE — Evaluation (Addendum)
Occupational Therapy Evaluation Patient Details Name: Trevor Murphy MRN: [id number] DOB: Apr 08, 1935 Today's Date: 09/25/2022   History of Present Illness Pt is an 87 y.o. male who presented 09/23/22 with bil lower extremity and abdominal swelling. Pt with cirrhosis of liver with ascites. PMH: blind L eye, SVT; PAF; HLD; HTN; ETOH abuse(quit in 2017); CVA (2013); stage 3 CKD; CAD; and chronic diastolic CHF   Clinical Impression   Pt lives alone, but family is in and out all day assisting with IADLs. Pt walks with a rollator and is modified independent in self care. Presents with low vision (baseline), generalized weakness and impaired standing balance. From elevated surfaces he requires min guard assist and RW. He needs min assist for ADLs. Pt reports his family can stay with him 24 hours when he returns home.      Recommendations for follow up therapy are one component of a multi-disciplinary discharge planning process, led by the attending physician.  Recommendations may be updated based on patient status, additional functional criteria and insurance authorization.   Follow Up Recommendations  Home Health OT     Assistance Recommended at Discharge Frequent or constant Supervision/Assistance  Patient can return home with the following A little help with walking and/or transfers;A little help with bathing/dressing/bathroom;Assistance with cooking/housework;Direct supervision/assist for medications management;Direct supervision/assist for financial management;Assist for transportation;Help with stairs or ramp for entrance    Functional Status Assessment  Patient has had a recent decline in their functional status and demonstrates the ability to make significant improvements in function in a reasonable and predictable amount of time.  Equipment Recommendations  None recommended by OT    Recommendations for Other Services       Precautions / Restrictions Precautions Precautions:  Fall Precaution Comments: low vision Restrictions Weight Bearing Restrictions: No      Mobility Bed Mobility Overal bed mobility: Needs Assistance Bed Mobility: Supine to Sit     Supine to sit: Supervision     General bed mobility comments: HOB up, no difficulty    Transfers Overall transfer level: Needs assistance Equipment used: Rolling walker (2 wheels) Transfers: Sit to/from Stand Sit to Stand: Min guard, From elevated surface           General transfer comment: no physical assist, bed and chair elevated      Balance Overall balance assessment: Needs assistance   Sitting balance-Leahy Scale: Good     Standing balance support: Bilateral upper extremity supported, During functional activity, Reliant on assistive device for balance Standing balance-Leahy Scale: Poor                             ADL either performed or assessed with clinical judgement   ADL Overall ADL's : Needs assistance/impaired Eating/Feeding: Set up;Sitting Eating/Feeding Details (indicate cue type and reason): cues to locate utensils and food items Grooming: Wash/dry hands;Wash/dry face;Sitting;Set up   Upper Body Bathing: Minimal assistance;Sitting   Lower Body Bathing: Minimal assistance;Sit to/from stand   Upper Body Dressing : Minimal assistance;Sitting   Lower Body Dressing: Minimal assistance;Sit to/from stand   Toilet Transfer: Min guard;Stand-pivot;Rolling walker (2 wheels)   Toileting- Clothing Manipulation and Hygiene: Minimal assistance;Sit to/from stand               Vision Baseline Vision/History: 2 Legally blind Patient Visual Report: No change from baseline Additional Comments: no vision in L eye, R with poor acuity     Perception  Praxis      Pertinent Vitals/Pain Pain Assessment Pain Assessment: No/denies pain     Hand Dominance Right   Extremity/Trunk Assessment Upper Extremity Assessment Upper Extremity Assessment: Overall WFL  for tasks assessed   Lower Extremity Assessment Lower Extremity Assessment: Defer to PT evaluation   Cervical / Trunk Assessment Cervical / Trunk Assessment: Kyphotic   Communication Communication Communication: HOH   Cognition Arousal/Alertness: Awake/alert Behavior During Therapy: WFL for tasks assessed/performed Overall Cognitive Status: No family/caregiver present to determine baseline cognitive functioning                                       General Comments       Exercises     Shoulder Instructions      Home Living Family/patient expects to be discharged to:: Private residence Living Arrangements: Alone Available Help at Discharge: Family;Available 24 hours/day Type of Home: Apartment Home Access: Level entry     Home Layout: One level     Bathroom Shower/Tub: Tub/shower unit         Home Equipment: Tub bench;Toilet riser;Grab bars - toilet;Grab bars - tub/shower;Wheelchair - Engineer, technical sales (4 wheels)          Prior Functioning/Environment Prior Level of Function : Needs assist             Mobility Comments: walks with rollator ADLs Comments: family assists daily with meals and IADLs, pt reports being able to bathe and dress himself        OT Problem List: Decreased strength;Decreased activity tolerance;Impaired balance (sitting and/or standing);Impaired vision/perception;Decreased knowledge of use of DME or AE      OT Treatment/Interventions: Self-care/ADL training;Therapeutic activities;Patient/family education;Balance training;DME and/or AE instruction    OT Goals(Current goals can be found in the care plan section) Acute Rehab OT Goals OT Goal Formulation: With patient Time For Goal Achievement: 10/16/22 Potential to Achieve Goals: Good ADL Goals Pt Will Perform Grooming: with supervision;standing Pt Will Perform Lower Body Bathing: with supervision;sit to/from stand Pt Will Perform Lower Body Dressing: with  supervision;sit to/from stand Pt Will Transfer to Toilet: with supervision;ambulating Pt Will Perform Toileting - Clothing Manipulation and hygiene: with supervision;sit to/from stand  OT Frequency: Min 2X/week    Co-evaluation              AM-PAC OT "6 Clicks" Daily Activity     Outcome Measure Help from another person eating meals?: A Little Help from another person taking care of personal grooming?: A Little Help from another person toileting, which includes using toliet, bedpan, or urinal?: A Little Help from another person bathing (including washing, rinsing, drying)?: A Little Help from another person to put on and taking off regular upper body clothing?: A Little Help from another person to put on and taking off regular lower body clothing?: A Little 6 Click Score: 18   End of Session Equipment Utilized During Treatment: Rolling walker (2 wheels);Gait belt Nurse Communication: Other (comment) (bleeding IV site)  Activity Tolerance: Patient tolerated treatment well Patient left: in chair;with call bell/phone within reach  OT Visit Diagnosis: Unsteadiness on feet (R26.81);Other abnormalities of gait and mobility (R26.89);Muscle weakness (generalized) (M62.81);Low vision, both eyes (H54.2)                Time: 0937-1000 OT Time Calculation (min): 23 min Charges:  OT General Charges $OT Visit: 1 Visit OT Evaluation $OT Eval Moderate Complexity: 1  Mod OT Treatments $Self Care/Home Management : 8-22 mins  Cleta Alberts, OTR/L Acute Rehabilitation Services Office: [phone]   Malka So 09/25/2022, 10:11 AM

## 2022-09-26 ENCOUNTER — Inpatient Hospital Stay (HOSPITAL_COMMUNITY): Payer: 59

## 2022-09-26 DIAGNOSIS — R627 Adult failure to thrive: Secondary | ICD-10-CM

## 2022-09-26 DIAGNOSIS — N289 Disorder of kidney and ureter, unspecified: Secondary | ICD-10-CM

## 2022-09-26 DIAGNOSIS — M25561 Pain in right knee: Secondary | ICD-10-CM

## 2022-09-26 DIAGNOSIS — D61818 Other pancytopenia: Secondary | ICD-10-CM

## 2022-09-26 LAB — MAGNESIUM: Magnesium: 1.7 mg/dL (ref 1.7–2.4)

## 2022-09-26 LAB — CBC WITH DIFFERENTIAL/PLATELET
Abs Immature Granulocytes: 0 10*3/uL (ref 0.00–0.07)
Basophils Absolute: 0 10*3/uL (ref 0.0–0.1)
Basophils Relative: 0 %
Eosinophils Absolute: 0.1 10*3/uL (ref 0.0–0.5)
Eosinophils Relative: 4 %
HCT: 29.6 % — ABNORMAL LOW (ref 39.0–52.0)
Hemoglobin: 9.9 g/dL — ABNORMAL LOW (ref 13.0–17.0)
Immature Granulocytes: 0 %
Lymphocytes Relative: 17 %
Lymphs Abs: 0.5 10*3/uL — ABNORMAL LOW (ref 0.7–4.0)
MCH: 30.4 pg (ref 26.0–34.0)
MCHC: 33.4 g/dL (ref 30.0–36.0)
MCV: 90.8 fL (ref 80.0–100.0)
Monocytes Absolute: 0.5 10*3/uL (ref 0.1–1.0)
Monocytes Relative: 18 %
Neutro Abs: 1.7 10*3/uL (ref 1.7–7.7)
Neutrophils Relative %: 61 %
Platelets: 52 10*3/uL — ABNORMAL LOW (ref 150–400)
RBC: 3.26 MIL/uL — ABNORMAL LOW (ref 4.22–5.81)
RDW: 15.9 % — ABNORMAL HIGH (ref 11.5–15.5)
WBC: 2.7 10*3/uL — ABNORMAL LOW (ref 4.0–10.5)
nRBC: 0 % (ref 0.0–0.2)

## 2022-09-26 LAB — COMPREHENSIVE METABOLIC PANEL
ALT: 25 U/L (ref 0–44)
AST: 33 U/L (ref 15–41)
Albumin: 1.7 g/dL — ABNORMAL LOW (ref 3.5–5.0)
Alkaline Phosphatase: 92 U/L (ref 38–126)
Anion gap: 7 (ref 5–15)
BUN: 15 mg/dL (ref 8–23)
CO2: 20 mmol/L — ABNORMAL LOW (ref 22–32)
Calcium: 7.2 mg/dL — ABNORMAL LOW (ref 8.9–10.3)
Chloride: 108 mmol/L (ref 98–111)
Creatinine, Ser: 1.8 mg/dL — ABNORMAL HIGH (ref 0.61–1.24)
GFR, Estimated: 36 mL/min — ABNORMAL LOW (ref >60)
Glucose, Bld: 94 mg/dL (ref 70–99)
Potassium: 3.7 mmol/L (ref 3.5–5.1)
Sodium: 135 mmol/L (ref 135–145)
Total Bilirubin: 0.6 mg/dL (ref 0.3–1.2)
Total Protein: 4.7 g/dL — ABNORMAL LOW (ref 6.5–8.1)

## 2022-09-26 MED ORDER — ACETAMINOPHEN 500 MG PO TABS
500.0000 mg | ORAL_TABLET | Freq: Four times a day (QID) | ORAL | Status: DC | PRN
  Administered 2022-09-26 – 2022-09-27 (×3): 500 mg via ORAL
  Filled 2022-09-26 (×3): qty 1

## 2022-09-26 MED ORDER — CAMPHOR-MENTHOL 0.5-0.5 % EX LOTN
TOPICAL_LOTION | CUTANEOUS | Status: DC | PRN
  Filled 2022-09-26: qty 222

## 2022-09-26 NOTE — Progress Notes (Signed)
Physical Therapy Treatment Patient Details Name: Trevor Murphy MRN: [id number] DOB: 10/17/1934 Today's Date: 09/26/2022   History of Present Illness Pt is an 88 y.o. male who presented 09/23/22 with bil lower extremity and abdominal swelling. Pt with cirrhosis of liver with ascites. PMH: blind L eye, SVT; PAF; HLD; HTN; ETOH abuse(quit in 2017); CVA (2013); stage 3 CKD; CAD; and chronic diastolic CHF    PT Comments    Very light min assist for transfer from bed and recliner today, reviewing techniques. Limited mainly by Rt knee pain which is notably edematous (see findings below). Tolerated ambulating with RW up to 45 feet without physical assistance. Pt still feels comfortable with family assist at d/c. Ice applied to Rt knee, elevated and supported on pillows. Patient will continue to benefit from skilled physical therapy services to further improve independence with functional mobility.  RLE edematous with some varus laxity, however only mild pain lateral knee, with moderate to severe pain medial knee. Lachmann's without gross instability although guarded. McMurray's painful throughout range no clunk. TTP primarily at medial joint line and pre and suprapatellar bursae.    Recommendations for follow up therapy are one component of a multi-disciplinary discharge planning process, led by the attending physician.  Recommendations may be updated based on patient status, additional functional criteria and insurance authorization.  Follow Up Recommendations  Home health PT     Assistance Recommended at Discharge Intermittent Supervision/Assistance  Patient can return home with the following A little help with walking and/or transfers;A little help with bathing/dressing/bathroom;Assistance with cooking/housework;Assist for transportation   Equipment Recommendations  Rolling walker (2 wheels)    Recommendations for Other Services Rehab consult     Precautions / Restrictions  Precautions Precautions: Fall Precaution Comments: low vision Restrictions Weight Bearing Restrictions: No     Mobility  Bed Mobility Overal bed mobility: Needs Assistance Bed Mobility: Supine to Sit     Supine to sit: Supervision     General bed mobility comments: supervision for safety. Requires extra time, cues for technique no assist to rise to EOB.    Transfers Overall transfer level: Needs assistance Equipment used: Rolling walker (2 wheels) Transfers: Sit to/from Stand Sit to Stand: Min assist           General transfer comment: Min assist for boost. Reviewed techniques for forward lean and hand placement. A bit weaker this morning complaining of Rt knee soreness. Performed from bed and recliner.    Ambulation/Gait Ambulation/Gait assistance: Supervision Gait Distance (Feet): 45 Feet Assistive device: Rolling walker (2 wheels) Gait Pattern/deviations: Step-through pattern, Decreased stride length, Trunk flexed, Antalgic Gait velocity: reduced Gait velocity interpretation: <1.31 ft/sec, indicative of household ambulator   General Gait Details: Mildly antalgic. Cues for increased step length, symmetry and RW proximity. No buckling noted. Pt bothered by soreness limiting distance today but did not require physical assist with RW.   Stairs             Wheelchair Mobility    Modified Rankin (Stroke Patients Only)       Balance Overall balance assessment: Needs assistance Sitting-balance support: No upper extremity supported Sitting balance-Leahy Scale: Good     Standing balance support: Bilateral upper extremity supported, During functional activity, Reliant on assistive device for balance Standing balance-Leahy Scale: Poor Standing balance comment: Stable with RW for support.  Cognition Arousal/Alertness: Awake/alert Behavior During Therapy: WFL for tasks assessed/performed Overall Cognitive Status: Within  Functional Limits for tasks assessed                                          Exercises      General Comments General comments (skin integrity, edema, etc.): RLE with some varus laxity, mild pain lateral knee, moderate to severe pain medial knee. Lachmann's without gross instability although guarded. McMurray's painful throughout range no clunk. TTP primarily at medial joint line and suprapatellar bursa      Pertinent Vitals/Pain Pain Assessment Pain Assessment: Faces Faces Pain Scale: Hurts even more Pain Location: Rt knee Pain Descriptors / Indicators: Discomfort Pain Intervention(s): Monitored during session, Repositioned, Ice applied    Home Living                          Prior Function            PT Goals (current goals can now be found in the care plan section) Acute Rehab PT Goals Patient Stated Goal: to get stronger and not fall PT Goal Formulation: With patient Time For Goal Achievement: 10/08/22 Potential to Achieve Goals: Good Progress towards PT goals: Progressing toward goals    Frequency    Min 3X/week      PT Plan Current plan remains appropriate    Co-evaluation              AM-PAC PT "6 Clicks" Mobility   Outcome Measure  Help needed turning from your back to your side while in a flat bed without using bedrails?: A Little Help needed moving from lying on your back to sitting on the side of a flat bed without using bedrails?: A Little Help needed moving to and from a bed to a chair (including a wheelchair)?: A Little Help needed standing up from a chair using your arms (e.g., wheelchair or bedside chair)?: A Little Help needed to walk in hospital room?: A Little Help needed climbing 3-5 steps with a railing? : A Lot 6 Click Score: 17    End of Session Equipment Utilized During Treatment: Gait belt Activity Tolerance: Patient tolerated treatment well Patient left: in chair;with call bell/phone within  reach;with chair alarm set Nurse Communication: Mobility status PT Visit Diagnosis: Unsteadiness on feet (R26.81);Other abnormalities of gait and mobility (R26.89);Muscle weakness (generalized) (M62.81);Repeated falls (R29.6);History of falling (Z91.81);Difficulty in walking, not elsewhere classified (R26.2);Pain Pain - Right/Left: Right Pain - part of body: Knee (Rt knee)     Time: [id number] PT Time Calculation (min) (ACUTE ONLY): 25 min  Charges:  $Gait Training: 8-22 mins $Therapeutic Activity: 8-22 mins                     Candie Mile, PT, DPT Physical Therapist Acute Rehabilitation Services [place]    Ellouise Newer 09/26/2022, 10:49 AM

## 2022-09-26 NOTE — Consult Note (Signed)
Palliative Medicine Inpatient Consult Note  Reason for consult: Goals of care discussion, decompensated cirrhosis, worsened renal function with limited options for  cirrhosis  HPI:  Per intake H&P -- by Jennette Kettle on 09/23/2021->  "Trevor Murphy is a 88 y.o. male with medical history significant of CKD 3, HTN, dCHF, PFO, Cryptogenic stroke, ILR -> PAF on eliquis, h/o heavy EtOH abuse, quit in 2017."   Pt with BLE and abd swelling.  Progressively worse over past 2-3 weeks.  Looks like this may have been ongoing for somewhat longer though.   Not on diuretic at baseline.   No SOB, CP, dizziness, N/V.   Some intermittent abd pain and L inguinal and umbilical hernia.  These ongoing for some time."   Abdominal US showed large volume ascites and nodular liver disease consistent with cirrhosis, gallbladder sludge and possible 10 mm polyp noted. MRCP on 07/14/2022 showed extensive ascites and mesenteric edema, paraesophageal varices and right gastric varices compatible with portal venous hypertension.   Paracentesis performed, started on lasix and spironolactone but CKD stage 3b is causing difficulty with diuresis  Right knee pain related to fall on Tuesday, now better with pain medication.   Clinical Assessment/Goals of Care: I have reviewed medical records including EPIC notes, labs and imaging,  assessed the patient.    I met with Trevor Murphy to further discuss diagnosis prognosis, GOC, EOL wishes, disposition and options.   I introduced Palliative Medicine as specialized medical care for people living with serious illness. It focuses on providing relief from the symptoms and stress of a serious illness. The goal is to improve quality of life for both the patient and the family.  A detailed discussion was had today regarding advanced directives.  Concepts specific to code status, artifical feeding and hydration, continued IV antibiotics and rehospitalization was had.  The difference between a  aggressive medical intervention path  and a palliative comfort care path for this patient at this time was had. Values and goals of care important to patient and family were attempted to be elicited.  I also called and spoke to his son Trevor Murphy.   Patient lives alone in an apartment. 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 He has one living son and one son died 24 years ago. He is of [place]. His [name] lives nearby and is his source of transportation. He has great support from his family, which is a strength.   [name] readily volunteered that he does not wish to be resuscitated. He states he has lived a good life. We completed a MOST form which I have placed to fax to Medical records. I placed a photo in media and the original is on his unit chart. He would want to come back to the hospital if his symptoms cannot be managed at home. He wishes for IV therapy, antibiotics and a feeding tube if indicated in the future but no ventilator.  His son states these topics have all been discussed previously and he is aware of his father's wishes.   Symptoms to manage currently are right knee pain, tiredness with walking, and pruritus.   Discussed the importance of continued conversation with family and their  medical providers regarding overall plan of care and treatment options, ensuring decisions are within the context of the patients values and GOCs.  Decision Maker: Patient  SUMMARY OF RECOMMENDATIONS    Code Status/Advance Care Planning:  DNAR/DNI- MOST completed today  Symptom Management:  Right knee pain- 500 mg every 6 hours for pain. Use sparingly Fatigue with walking- OT and PT  Pruitis-Sarna lotion prn   Palliative Prophylaxis:  Aspiration precautions, bowel regimen, delirium protocol, frequent pain assessment  Additional Recommendations (Limitations, Scope, Preferences): Preferences for IVFs, antibiotics, feeding tube if  indicated. Limitations: no CPR, no ventilator  Psycho-social/Spiritual:  Desire for further Chaplaincy support: yes Additional Recommendations:    Prognosis: life limiting illness, to be determined  Discharge Planning:  Desires to go home with family with home health  Review of Systems  Constitutional:  Positive for malaise/fatigue.  HENT: Negative.    Eyes: Negative.   Respiratory: Negative.    Cardiovascular: Negative.   Gastrointestinal: Negative.   Genitourinary: Negative.   Musculoskeletal:  Positive for joint pain.       Right kneww following fall Tuesday, better with medicaiton  Skin:  Positive for itching.  Neurological: Negative.   Endo/Heme/Allergies: Negative.   Psychiatric/Behavioral: Negative.         09/26/2022    9:42 AM 09/26/2022    5:12 AM 09/25/2022    8:13 PM  Vitals with BMI  Systolic [id number]  Diastolic 65 67 59  Pulse 72 75 71    Physical Exam Constitutional:      General: He is not in acute distress.    Appearance: He is ill-appearing. He is not diaphoretic.  HENT:     Head: Normocephalic.  Eyes:     Comments: Eye structures are sunken and eyeballs protrude bilaterally  Cardiovascular:     Rate and Rhythm: Normal rate and regular rhythm.     Pulses: Normal pulses.  Pulmonary:     Effort: Pulmonary effort is normal.  Abdominal:     General: There is distension.  Musculoskeletal:     Right lower leg: Edema present.     Left lower leg: Edema present.  Skin:    General: Skin is warm and dry.     Capillary Refill: Capillary refill takes less than 2 seconds.  Neurological:     General: No focal deficit present.     Mental Status: He is alert and oriented to person, place, and time.     Motor: No weakness.  Psychiatric:        Mood and Affect: Mood normal.        Behavior: Behavior normal.        Thought Content: Thought content normal.        Judgment: Judgment normal.     PPS: 50%   This conversation/these recommendations  were discussed with patient primary care team, Dr. Sabino Gasser.  Thank you for the opportunity to participate in the care of this patient and family.   Total Time: 70 minutes Greater than 50%  of this time was spent counseling and coordinating care related to the above assessment and plan.  Trevor Spar, NP [facility] Palliative Medicine Team Team Cell Phone: [phone] Please utilize secure chat with additional questions, if there is no response within 30 minutes please call the above phone number  Palliative Medicine Team providers are available by phone from 7am to 7pm daily and can be reached through the team cell phone.  Should this patient require assistance outside of these hours, please call the patient's attending physician.

## 2022-09-26 NOTE — Assessment & Plan Note (Signed)
-   s/p fall at home prior to admission; now having some worsening pain and swelling today but still able to ambulate - X-ray obtained which shows severe underlying osteoarthritis as expected and a moderate effusion.  Likely traumatic/inflammatory and low suspicion for infectious.  Hold off on any aspiration at this time - Continue supportive care with ice and wrapping as needed - Low-dose Tylenol for pain in setting of cirrhosis -Pain much improved today

## 2022-09-26 NOTE — Progress Notes (Signed)
Progress Note   Subjective  Patient states he feels about the same. He has some ascites present, he thinks starting to re-accumulate, and LE edema. His pain complaint is R knee pain after he fell on it Tuesday, xray in room to evaluate this AM.   Objective   Vital signs in last 24 hours: Temp:  [97.5 F (36.4 C)-98.6 F (37 C)] 98 F (36.7 C) (02/17 0942) Pulse Rate:  [71-94] 72 (02/17 0942) Resp:  [18] 18 (02/17 0942) BP: (97-165)/(59-93) 102/65 (02/17 0942) SpO2:  [96 %-100 %] 96 % (02/17 0942) Last BM Date : 09/25/22 General:    male in NAD Abdomen:  Soft, distended with some ascites but not tight.  Extremities:  (+) 2 LE edema. Neurologic:  Alert and oriented,  grossly normal neurologically. Psych:  Cooperative. Normal mood and affect.  Intake/Output from previous day: 02/16 0701 - 02/17 0700 In: 480 [P.O.:480] Out: 1150 [Urine:1150] Intake/Output this shift: Total I/O In: 240 [P.O.:240] Out: 0   Lab Results: Recent Labs    09/24/22 0121 09/25/22 0323 09/26/22 0222  WBC 3.0* 2.8* 2.7*  HGB 10.7* 10.6* 9.9*  HCT 32.6* 32.1* 29.6*  PLT 59* 54* 52*   BMET Recent Labs    09/24/22 0121 09/25/22 0323 09/26/22 0222  NA 141 138 135  K 4.1 3.7 3.7  CL 113* 111 108  CO2 20* 20* 20*  GLUCOSE 108* 98 94  BUN 16 16 15  $ CREATININE 1.91* 1.82* 1.80*  CALCIUM 7.7* 7.4* 7.2*   LFT Recent Labs    09/26/22 0222  PROT 4.7*  ALBUMIN 1.7*  AST 33  ALT 25  ALKPHOS 92  BILITOT 0.6   PT/INR Recent Labs    09/24/22 0121  LABPROT 17.2*  INR 1.4*    Studies/Results: IR Paracentesis  Result Date: 09/25/2022 INDICATION: 88 year old male with abdominal distention, ascites. Request made for diagnostic and therapeutic paracentesis. EXAM: ULTRASOUND GUIDED DIAGNOSTIC AND THERAPEUTIC PARACENTESIS MEDICATIONS: 10 mL 1% lidocaine COMPLICATIONS: None immediate. PROCEDURE: Informed written consent was obtained from the patient after a discussion of the risks,  benefits and alternatives to treatment. A timeout was performed prior to the initiation of the procedure. Initial ultrasound scanning demonstrates a moderate amount of ascites within the left lateral abdomen. The left lateral abdomen was prepped and draped in the usual sterile fashion. 1% lidocaine was used for local anesthesia. Following this, a 19 gauge, 7-cm, Yueh catheter was introduced. An ultrasound image was saved for documentation purposes. The paracentesis was performed. The catheter was removed and a dressing was applied. The patient tolerated the procedure well without immediate post procedural complication. FINDINGS: A total of approximately 2.1 liters of clear, yellow fluid was removed. Samples were sent to the laboratory as requested by the clinical team. IMPRESSION: Successful ultrasound-guided paracentesis yielding 2.1 liters of peritoneal fluid. Read by: Brynda Greathouse PA-C Electronically Signed   By: Corrie Mckusick D.O.   On: 09/25/2022 10:15   ECHOCARDIOGRAM COMPLETE  Result Date: 09/24/2022    ECHOCARDIOGRAM REPORT   Patient Name:   Trevor Murphy Date of Exam: 09/24/2022 Medical Rec #:  [id number]    Height:       70.0 in Accession #:    [id number]   Weight:       144.8 lb Date of Birth:  April 20, 1935    BSA:          1.820 m Patient Age:    53 years  BP:           142/76 mmHg Patient Gender: M            HR:           48 bpm. Exam Location:  Inpatient Procedure: 2D Echo, 3D Echo, Cardiac Doppler and Color Doppler Indications:    I50.40* Unspecified combined systolic (congestive) and diastolic                 (congestive) heart failure  History:        Patient has prior history of Echocardiogram examinations, most                 recent 05/24/2019. CHF, CAD, Abnormal ECG, Stroke,                 Arrythmias:Bradycardia, Atrial Fibrillation and Tachycardia,                 Signs/Symptoms:Edema; Risk Factors:Hypertension and                 Dyslipidemia. ETOH. PFO.  Sonographer:    Roseanna Rainbow RDCS  Referring Phys: [place]  Sonographer Comments: Study delayed for patient care. IMPRESSIONS  1. Proximal septal thickening, Basal inferior hypokinesis. Left ventricular ejection fraction, by estimation, is 55 to 60%. The left ventricle has normal function. There is mild left ventricular hypertrophy. Left ventricular diastolic parameters are indeterminate.  2. Right ventricular systolic function is normal. The right ventricular size is normal.  3. MR is somewhat eccentric. It appears at least moderately severe to severe. Consider TEE to further evaluate mechanism/severity. The mitral valve is normal in structure.  4. The aortic valve is tricuspid. Aortic valve regurgitation is moderate. Aortic valve sclerosis is present, with no evidence of aortic valve stenosis. FINDINGS  Left Ventricle: Proximal septal thickening, Basal inferior hypokinesis. Left ventricular ejection fraction, by estimation, is 55 to 60%. The left ventricle has normal function. The left ventricular internal cavity size was normal in size. There is mild left ventricular hypertrophy. Left ventricular diastolic parameters are indeterminate. Right Ventricle: The right ventricular size is normal. Right vetricular wall thickness was not assessed. Right ventricular systolic function is normal. Left Atrium: Left atrial size was normal in size. Right Atrium: Right atrial size was normal in size. Pericardium: Trivial pericardial effusion is present. Mitral Valve: MR is somewhat eccentric. It appears at least moderately severe to severe. Consider TEE to further evaluate mechanism/severity. The mitral valve is normal in structure. There is mild thickening of the mitral valve leaflet(s). Mild mitral annular calcification. Tricuspid Valve: The tricuspid valve is normal in structure. Tricuspid valve regurgitation is mild. Aortic Valve: The aortic valve is tricuspid. Aortic valve regurgitation is moderate. Aortic regurgitation PHT measures 878 msec.  Aortic valve sclerosis is present, with no evidence of aortic valve stenosis. Pulmonic Valve: The pulmonic valve was thickened with good excursion. Pulmonic valve regurgitation is mild. Aorta: The aortic root and ascending aorta are structurally normal, with no evidence of dilitation. IAS/Shunts: No atrial level shunt detected by color flow Doppler.  LEFT VENTRICLE PLAX 2D LVIDd:         4.60 cm      Diastology LVIDs:         3.50 cm      LV e' medial:    5.66 cm/s LV PW:         1.30 cm      LV E/e' medial:  13.1 LV IVS:  1.30 cm      LV e' lateral:   5.11 cm/s LVOT diam:     2.60 cm      LV E/e' lateral: 14.5 LV SV:         102 LV SV Index:   56 LVOT Area:     5.31 cm                              3D Volume EF: LV Volumes (MOD)            3D EF:        63 % LV vol d, MOD A4C: 114.0 ml LV EDV:       132 ml LV vol s, MOD A2C: 37.6 ml  LV ESV:       48 ml LV vol s, MOD A4C: 69.1 ml  LV SV:        83 ml LV SV MOD A4C:     114.0 ml RIGHT VENTRICLE             IVC RV S prime:     12.10 cm/s  IVC diam: 1.70 cm TAPSE (M-mode): 1.8 cm LEFT ATRIUM             Index        RIGHT ATRIUM           Index LA diam:        3.40 cm 1.87 cm/m   RA Area:     10.00 cm LA Vol (A2C):   24.3 ml 13.35 ml/m  RA Volume:   17.70 ml  9.73 ml/m LA Vol (A4C):   40.2 ml 22.09 ml/m LA Biplane Vol: 35.6 ml 19.56 ml/m  AORTIC VALVE             PULMONIC VALVE LVOT Vmax:   69.50 cm/s  PR End Diast Vel: 2.01 msec LVOT Vmean:  44.800 cm/s LVOT VTI:    0.192 m AI PHT:      878 msec  AORTA Ao Root diam: 4.05 cm Ao Asc diam:  3.90 cm MITRAL VALVE                  TRICUSPID VALVE MV Area (PHT): 2.16 cm       TR Peak grad:   32.5 mmHg MV Decel Time: 351 msec       TR Vmax:        285.00 cm/s MR Peak grad:    111.1 mmHg MR Mean grad:    70.0 mmHg    SHUNTS MR Vmax:         527.00 cm/s  Systemic VTI:  0.19 m MR Vmean:        385.0 cm/s   Systemic Diam: 2.60 cm MR PISA:         3.08 cm MR PISA Eff ROA: 22 mm MR PISA Radius:  0.70 cm MV E  velocity: 74.25 cm/s MV A velocity: 71.10 cm/s MV E/A ratio:  1.04 Dorris Carnes MD Electronically signed by Dorris Carnes MD Signature Date/Time: 09/24/2022/5:36:28 PM    Final     MELD 3.0: 19 at 09/26/2022  2:22 AM MELD-Na: 18 at 09/26/2022  2:22 AM Calculated from: Serum Creatinine: 1.80 mg/dL at 09/26/2022  2:22 AM Serum Sodium: 135 mmol/L at 09/26/2022  2:22 AM Total Bilirubin: 0.6 mg/dL (Using min of 1 mg/dL) at 09/26/2022  2:22 AM Serum Albumin: 1.7 g/dL at 09/26/2022  2:22 AM INR(ratio): 1.4 at 09/24/2022  1:21 AM Age at listing (hypothetical): 65 years Sex: Male at 09/26/2022  2:22 AM     Assessment / Plan:    88 y/o male admitted with the following issues:  Decompensated cirrhosis with ascites (MELD 3.0 is 19) LE edema Weakness / sarcopenia Renal insufficiency Moderate to Severe MR Pancytopenia Anticoagulated -history of atrial fibrillation with stroke  Cirrhosis that is decompensated with ascites and lower extremity edema, suspected varices on imaging.  Admitted with worsening ascites, weakness, failure to thrive.  He lives by himself in an apartment.  Unclear source of cirrhosis, Dr. Benson Norway did not think worthwhile pursue further workup for this right now, likely due to alcohol. Hep B /C negative.  Difficult situation.  He has chronic kidney disease which may limit the use of diuretics in the situation.  He has not a good candidate for TIPS given his comorbidities and severe MR.  He is not a transplant candidate.  He is high risk for anticoagulation in light of suspected varices, however with A-fib and history of stroke, if no overt bleeding could cautiously resume anticoagulation although needs to be thorough discussion with him about risk benefits with his primary team in regards to his stroke risk.  I discussed cirrhosis with him, unfortunately we do not have a quick way to resolve all of this for him.  He has clearly decompensated with time, concerned about worsening renal function  with diuretics and that he is not a candidate for other more aggressive interventions.  He lives by himself, with his debilitation, prognosis is likely poor.  I think palliative care consult may be very reasonable in this situation.  For now, continue present dosing of Aldactone, would not escalate or add back Lasix right now, while his creatinine appears to be stable and going in the right direction.  Can potentially add back low-dose Lasix if creatinine improves moving forward.  Of note, he is on a very high dose of statin which is contraindicated in the setting of decompensated cirrhosis.  I would keep him on a 10 mg dosing of atorvastatin as max dose in this scenario.  We will follow peripherally over the weekend, Dr. Benson Norway or Collene Mares will resume care on Monday.  Call with questions in the interim.  RECOMMEND: - low Na diet - continue aldactone 137m / day - continue to hold lasix - pending renal function if stable may add back low dose cautiously in upcoming days - recommend palliative care consult - he has decompensated cirrhosis, is not a candidate for more aggressive measures such as transplant, and a poor candidate for TIPS should his ascites prove refractory - reduce dose of lipitor to max dose of 125m- will need discussion about resumption of anticoagulation with primary team - he has significant risks of bleeding with low platelets and varices, but also has history of CVA. He is not actively bleeding at this time. Cannot add empiric Coreg or Nadolol given renal function / ascites  Dr. MaCollene Mares HuBenson Norwayo resume his care on Monday, call in the interim with questions.  StJolly MangoMD [facility]

## 2022-09-26 NOTE — Progress Notes (Signed)
Progress Note    Trevor Murphy   [id number]  DOB: 06-Jan-1935  DOA: 09/23/2022     2 PCP: [name], FNP  Initial CC: swelling  Hospital Course: [name] is an 88 yo male with PMH CKD 3, HTN, dCHF, PFO, cryptogenic stroke s/p ILR, PAF on eliquis, prior heavy etoh use (quit 2017). He presented with lower extremity edema, abdominal swelling, and generalized malaise. Abdominal ultrasound obtained in the ER showed large volume ascites and nodular liver consistent with cirrhosis.  Gallbladder sludge and possible 10 mm polyp noted.  He also had prior MRCP on 07/14/2022 which also showed extensive ascites and mesenteric edema along with paraesophageal varices and right gastric varices compatible with portal venous hypertension.  Due to these findings, GI was consulted on admission.  Patient was also ordered paracentesis for diagnostic and therapeutic purposes and started on Lasix/spironolactone.  Interval History:  No events overnight.  Complaining of more pain in his right knee when seen this morning.  He was still able to ambulate about 45 feet with rolling walker with PT this morning.  Assessment and Plan: * Cirrhosis of liver with ascites ([place]) - Maybe EtOH cirrhosis given PMH of EtOH abuse? "h/o heavy use, quit in 2017" - s/p paracentesis on 2/15 with 2.1 L fluid removed - GI following, appreciate assistance - continues on spironolactone; on hold is lasix -Limited options in setting of his decompensated cirrhosis.  Not a transplant candidate or TIPS candidate due to comorbidities and severe MR; CKD also limits diuretic use.  Given difficulty with treatment options, palliative care consult recommended which is certainly appropriate in this context; I tried calling son today to discuss/update but no answer and will keep trying - d/c statin given no further mortality benefit and contraindication now  Chronic kidney disease, stage 3b ([place]) - patient has history of CKD3b. Baseline creat ~  1.8, eGFR 37-42 -Difficulty with diuresis and labile renal fxn   Paroxysmal atrial fibrillation ([facility]) H/o cryptogenic stroke, ILR placement, PAF found started on eliquis - now he has worsening liver decompensation and severe thrombocytopenia; also discussed with GI - it's feasible that anticoag could be continued very cautiously, but I agree it's still rather high risk. The alternative is not the greatest to holding either; but I will continue to hold for now and discuss further with patient and family prior to discharge  Chronic diastolic CHF (congestive heart failure) ([place]) - Last echo reviewed, October 2020: EF 55 to 60%, severe LVH, impaired relaxation -On Jardiance at home - Continue spironolactone; lasix on hold - BNP also elevated - echo repeated: EF 55-60%; mild LVH, indeterminate diastolic parameters; moderately severe to severe MR - will recommend outpatient cardiology followup  Right knee pain - s/p fall at home prior to admission; now having some worsening pain and swelling today but still able to ambulate - X-ray obtained which shows severe underlying osteoarthritis as expected and a moderate effusion.  Likely traumatic/inflammatory and low suspicion for infectious.  Hold off on any aspiration at this time - Continue supportive care with ice and wrapping as needed - Low-dose Tylenol for pain in setting of cirrhosis  Pancytopenia ([place]) - Suspect this is due to bone marrow suppression from underlying cirrhosis  Inguinal bulge Reducible hernia. Follow up with gen surg as outpt.  Mixed hyperlipidemia - discontinue statin in setting of decompensated cirrhosis and likely no further mortality benefit at this time    Old records reviewed in assessment of this patient  Antimicrobials:  DVT prophylaxis:  SCDs Start: 09/23/22 2040   Code Status:   Code Status: Full Code  Mobility Assessment (last 72 hours)     Mobility Assessment     Row Name 09/26/22 1000  09/25/22 2100 09/25/22 1100 09/25/22 1000 09/24/22 2150   Does patient have an order for bedrest or is patient medically unstable -- No - Continue assessment -- -- No - Continue assessment   What is the highest level of mobility based on the progressive mobility assessment? Level 5 (Walks with assist in room/hall) - Balance while stepping forward/back and can walk in room with assist - Complete Level 4 (Walks with assist in room) - Balance while marching in place and cannot step forward and back - Complete Level 5 (Walks with assist in room/hall) - Balance while stepping forward/back and can walk in room with assist - Complete Level 4 (Walks with assist in room) - Balance while marching in place and cannot step forward and back - Complete Level 4 (Walks with assist in room) - Balance while marching in place and cannot step forward and back - Complete    Row Name 09/24/22 1100 09/24/22 0800 09/24/22 0046       Does patient have an order for bedrest or is patient medically unstable -- No - Continue assessment No - Continue assessment     What is the highest level of mobility based on the progressive mobility assessment? Level 4 (Walks with assist in room) - Balance while marching in place and cannot step forward and back - Complete Level 4 (Walks with assist in room) - Balance while marching in place and cannot step forward and back - Complete Level 4 (Walks with assist in room) - Balance while marching in place and cannot step forward and back - Complete              Barriers to discharge:  Disposition Plan:  Home 1-2 days Status is: Inpt  Objective: Blood pressure 102/65, pulse 72, temperature 98 F (36.7 C), temperature source Oral, resp. rate 18, height 5' 10"$  (1.778 m), weight 65.7 kg, SpO2 96 %.  Examination: GI Physical Exam Constitutional:      General: He is not in acute distress.    Appearance: Normal appearance.  HENT:     Head: Normocephalic and atraumatic.     Mouth/Throat:      Mouth: Mucous membranes are moist.  Eyes:     Extraocular Movements: Extraocular movements intact.  Cardiovascular:     Rate and Rhythm: Normal rate.  Pulmonary:     Effort: Pulmonary effort is normal. No respiratory distress.     Breath sounds: Normal breath sounds. No wheezing.  Abdominal:     General: Bowel sounds are normal. There is distension.     Palpations: Abdomen is soft.     Tenderness: There is no abdominal tenderness.  Musculoskeletal:        General: Swelling present. Normal range of motion.     Cervical back: Normal range of motion and neck supple.     Comments: 1-2 + B/L LE edema.  Slight worsening of pain and swelling in right knee with ice pack in place.  No appreciated erythema or calor  Skin:    General: Skin is warm and dry.  Neurological:     General: No focal deficit present.     Mental Status: He is alert.  Psychiatric:        Mood and Affect: Mood normal.  Consultants:  GI  Procedures:  09/24/22: Paracentesis   Data Reviewed: Results for orders placed or performed during the hospital encounter of 09/23/22 (from the past 24 hour(s))  CBC with Differential/Platelet     Status: Abnormal   Collection Time: 09/26/22  2:22 AM  Result Value Ref Range   WBC 2.7 (L) 4.0 - 10.5 K/uL   RBC 3.26 (L) 4.22 - 5.81 MIL/uL   Hemoglobin 9.9 (L) 13.0 - 17.0 g/dL   HCT 29.6 (L) 39.0 - 52.0 %   MCV 90.8 80.0 - 100.0 fL   MCH 30.4 26.0 - 34.0 pg   MCHC 33.4 30.0 - 36.0 g/dL   RDW 15.9 (H) 11.5 - 15.5 %   Platelets 52 (L) 150 - 400 K/uL   nRBC 0.0 0.0 - 0.2 %   Neutrophils Relative % 61 %   Neutro Abs 1.7 1.7 - 7.7 K/uL   Lymphocytes Relative 17 %   Lymphs Abs 0.5 (L) 0.7 - 4.0 K/uL   Monocytes Relative 18 %   Monocytes Absolute 0.5 0.1 - 1.0 K/uL   Eosinophils Relative 4 %   Eosinophils Absolute 0.1 0.0 - 0.5 K/uL   Basophils Relative 0 %   Basophils Absolute 0.0 0.0 - 0.1 K/uL   Immature Granulocytes 0 %   Abs Immature Granulocytes 0.00 0.00 -  0.07 K/uL  Comprehensive metabolic panel     Status: Abnormal   Collection Time: 09/26/22  2:22 AM  Result Value Ref Range   Sodium 135 135 - 145 mmol/L   Potassium 3.7 3.5 - 5.1 mmol/L   Chloride 108 98 - 111 mmol/L   CO2 20 (L) 22 - 32 mmol/L   Glucose, Bld 94 70 - 99 mg/dL   BUN 15 8 - 23 mg/dL   Creatinine, Ser 1.80 (H) 0.61 - 1.24 mg/dL   Calcium 7.2 (L) 8.9 - 10.3 mg/dL   Total Protein 4.7 (L) 6.5 - 8.1 g/dL   Albumin 1.7 (L) 3.5 - 5.0 g/dL   AST 33 15 - 41 U/L   ALT 25 0 - 44 U/L   Alkaline Phosphatase 92 38 - 126 U/L   Total Bilirubin 0.6 0.3 - 1.2 mg/dL   GFR, Estimated 36 (L) >60 mL/min   Anion gap 7 5 - 15  Magnesium     Status: None   Collection Time: 09/26/22  2:22 AM  Result Value Ref Range   Magnesium 1.7 1.7 - 2.4 mg/dL    I have reviewed pertinent nursing notes, vitals, labs, and images as necessary. I have ordered labwork to follow up on as indicated.  I have reviewed the last notes from staff over past 24 hours. I have discussed patient's care plan and test results with nursing staff, CM/SW, and other staff as appropriate.  Time spent: Greater than 50% of the 55 minute visit was spent in counseling/coordination of care for the patient as laid out in the A&P.   LOS: 2 days   Dwyane Dee, MD [facility] 09/26/2022, 3:14 PM

## 2022-09-27 LAB — CBC WITH DIFFERENTIAL/PLATELET
Abs Immature Granulocytes: 0.01 10*3/uL (ref 0.00–0.07)
Basophils Absolute: 0 10*3/uL (ref 0.0–0.1)
Basophils Relative: 0 %
Eosinophils Absolute: 0.2 10*3/uL (ref 0.0–0.5)
Eosinophils Relative: 5 %
HCT: 32.1 % — ABNORMAL LOW (ref 39.0–52.0)
Hemoglobin: 10.8 g/dL — ABNORMAL LOW (ref 13.0–17.0)
Immature Granulocytes: 0 %
Lymphocytes Relative: 17 %
Lymphs Abs: 0.5 10*3/uL — ABNORMAL LOW (ref 0.7–4.0)
MCH: 30.3 pg (ref 26.0–34.0)
MCHC: 33.6 g/dL (ref 30.0–36.0)
MCV: 90.2 fL (ref 80.0–100.0)
Monocytes Absolute: 0.5 10*3/uL (ref 0.1–1.0)
Monocytes Relative: 15 %
Neutro Abs: 2 10*3/uL (ref 1.7–7.7)
Neutrophils Relative %: 63 %
Platelets: 65 10*3/uL — ABNORMAL LOW (ref 150–400)
RBC: 3.56 MIL/uL — ABNORMAL LOW (ref 4.22–5.81)
RDW: 15.9 % — ABNORMAL HIGH (ref 11.5–15.5)
WBC: 3.1 10*3/uL — ABNORMAL LOW (ref 4.0–10.5)
nRBC: 0 % (ref 0.0–0.2)

## 2022-09-27 LAB — COMPREHENSIVE METABOLIC PANEL WITH GFR
ALT: 30 U/L (ref 0–44)
AST: 35 U/L (ref 15–41)
Albumin: 2 g/dL — ABNORMAL LOW (ref 3.5–5.0)
Alkaline Phosphatase: 106 U/L (ref 38–126)
Anion gap: 8 (ref 5–15)
BUN: 17 mg/dL (ref 8–23)
CO2: 19 mmol/L — ABNORMAL LOW (ref 22–32)
Calcium: 7.3 mg/dL — ABNORMAL LOW (ref 8.9–10.3)
Chloride: 106 mmol/L (ref 98–111)
Creatinine, Ser: 1.81 mg/dL — ABNORMAL HIGH (ref 0.61–1.24)
GFR, Estimated: 36 mL/min — ABNORMAL LOW
Glucose, Bld: 112 mg/dL — ABNORMAL HIGH (ref 70–99)
Potassium: 3.7 mmol/L (ref 3.5–5.1)
Sodium: 133 mmol/L — ABNORMAL LOW (ref 135–145)
Total Bilirubin: 0.9 mg/dL (ref 0.3–1.2)
Total Protein: 5.4 g/dL — ABNORMAL LOW (ref 6.5–8.1)

## 2022-09-27 LAB — MAGNESIUM: Magnesium: 1.8 mg/dL (ref 1.7–2.4)

## 2022-09-27 NOTE — Progress Notes (Signed)
Mobility Specialist Progress Note   09/27/22 1100  Mobility  Activity Ambulated with assistance in hallway  Level of Assistance Contact guard assist, steadying assist  Assistive Device Front wheel walker  Distance Ambulated (ft) 60 ft  Range of Motion/Exercises Active;All extremities  Activity Response Tolerated well   Patient received in supine, sleep and easily aroused. Agreeable to participate. Reported improved pain tolerance. Was independent for bed mobility and required min A for initial boost to stand + cues for hand/foot placement. Ambulated min guard with slow steady gait. Needed cues to navigate environment second visual deficits. Returned to room without complaint or incident. Was left in supine with all needs met, call bell in reach.   [place] Leiam Hopwood, BS EXP Mobility Specialist Please contact via SecureChat or Rehab office at [phone]

## 2022-09-27 NOTE — Progress Notes (Signed)
Progress Note    Trevor Murphy   [id number]  DOB: 1934/12/12  DOA: 09/23/2022     3 PCP: [name], FNP  Initial CC: swelling  Hospital Course: Trevor Murphy is an 88 yo male with PMH CKD 3, HTN, dCHF, PFO, cryptogenic stroke s/p ILR, PAF on eliquis, prior heavy etoh use (quit 2017). He presented with lower extremity edema, abdominal swelling, and generalized malaise. Abdominal ultrasound obtained in the ER showed large volume ascites and nodular liver consistent with cirrhosis.  Gallbladder sludge and possible 10 mm polyp noted.  He also had prior MRCP on 07/14/2022 which also showed extensive ascites and mesenteric edema along with paraesophageal varices and right gastric varices compatible with portal venous hypertension.  Due to these findings, GI was consulted on admission.  Patient was also ordered paracentesis for diagnostic and therapeutic purposes and started on Lasix/spironolactone.  Interval History:  No events overnight.  Knee pain much better today when seen.  He had no concerns in general.  Understands discussions from yesterday regarding decompensated cirrhosis and guarded prognosis.  Assessment and Plan: * Cirrhosis of liver with ascites ([place]) - Maybe EtOH cirrhosis given PMH of EtOH abuse? "h/o heavy use, quit in 2017" - s/p paracentesis on 2/15 with 2.1 L fluid removed - GI following, appreciate assistance - continues on spironolactone; on hold is lasix -Limited options in setting of his decompensated cirrhosis.  Not a transplant candidate or TIPS candidate due to comorbidities and severe MR; CKD also limits diuretic use.  Given difficulty with treatment options, palliative care consult recommended which is certainly appropriate in this context -[place] discussions have been held with palliative care. DNR at this time, but continuing treatment as noted; patient and son seem to understand generalized guarded prognosis. MOST form has been filled out as well - d/c statin given  no further mortality benefit and contraindication now  Chronic kidney disease, stage 3b ([place]) - patient has history of CKD3b. Baseline creat ~ 1.8, eGFR 37-42 -Difficulty with diuresis and labile renal fxn   Paroxysmal atrial fibrillation (HCC) H/o cryptogenic stroke, ILR placement, PAF found started on eliquis - now he has worsening liver decompensation and severe thrombocytopenia; also discussed with GI - it's feasible that anticoag could be continued very cautiously, but I agree it's still rather high risk. The alternative is not the greatest to holding either; but I will continue to hold for now and discuss further with patient and family prior to discharge  Chronic diastolic CHF (congestive heart failure) ([place]) - Last echo reviewed, October 2020: EF 55 to 60%, severe LVH, impaired relaxation -On Jardiance at home - Continue spironolactone; lasix on hold - BNP also elevated - echo repeated: EF 55-60%; mild LVH, indeterminate diastolic parameters; moderately severe to severe MR - will recommend outpatient cardiology followup  Right knee pain - s/p fall at home prior to admission; now having some worsening pain and swelling today but still able to ambulate - X-ray obtained which shows severe underlying osteoarthritis as expected and a moderate effusion.  Likely traumatic/inflammatory and low suspicion for infectious.  Hold off on any aspiration at this time - Continue supportive care with ice and wrapping as needed - Low-dose Tylenol for pain in setting of cirrhosis -Pain much improved today  Pancytopenia ([place]) - Suspect this is due to bone marrow suppression from underlying cirrhosis  Inguinal bulge Reducible hernia. Follow up with gen surg as outpt.  Mixed hyperlipidemia - discontinue statin in setting of decompensated cirrhosis and likely no  further mortality benefit at this time    Old records reviewed in assessment of this patient  Antimicrobials:   DVT prophylaxis:   SCDs Start: 09/23/22 2040   Code Status:   Code Status: DNR  Mobility Assessment (last 72 hours)     Mobility Assessment     Row Name 09/26/22 1000 09/25/22 2100 09/25/22 1100 09/25/22 1000 09/24/22 2150   Does patient have an order for bedrest or is patient medically unstable -- No - Continue assessment -- -- No - Continue assessment   What is the highest level of mobility based on the progressive mobility assessment? Level 5 (Walks with assist in room/hall) - Balance while stepping forward/back and can walk in room with assist - Complete Level 4 (Walks with assist in room) - Balance while marching in place and cannot step forward and back - Complete Level 5 (Walks with assist in room/hall) - Balance while stepping forward/back and can walk in room with assist - Complete Level 4 (Walks with assist in room) - Balance while marching in place and cannot step forward and back - Complete Level 4 (Walks with assist in room) - Balance while marching in place and cannot step forward and back - Complete            Barriers to discharge:  Disposition Plan:  Home 1-2 days Status is: Inpt  Objective: Blood pressure (!) 146/72, pulse 73, temperature 97.7 F (36.5 C), temperature source Oral, resp. rate 18, height 5' 10"$  (1.778 m), weight 65.7 kg, SpO2 100 %.  Examination: GI Physical Exam Constitutional:      General: He is not in acute distress.    Appearance: Normal appearance.  HENT:     Head: Normocephalic and atraumatic.     Mouth/Throat:     Mouth: Mucous membranes are moist.  Eyes:     Extraocular Movements: Extraocular movements intact.  Cardiovascular:     Rate and Rhythm: Normal rate.  Pulmonary:     Effort: Pulmonary effort is normal. No respiratory distress.     Breath sounds: Normal breath sounds. No wheezing.  Abdominal:     General: Bowel sounds are normal. There is distension.     Palpations: Abdomen is soft.     Tenderness: There is no abdominal tenderness.   Musculoskeletal:        General: Swelling present. Normal range of motion.     Cervical back: Normal range of motion and neck supple.     Comments: 1-2 + B/L LE edema.  Improved right knee swelling and tenderness  Skin:    General: Skin is warm and dry.  Neurological:     General: No focal deficit present.     Mental Status: He is alert.  Psychiatric:        Mood and Affect: Mood normal.      Consultants:  GI  Procedures:  09/24/22: Paracentesis   Data Reviewed: Results for orders placed or performed during the hospital encounter of 09/23/22 (from the past 24 hour(s))  CBC with Differential/Platelet     Status: Abnormal   Collection Time: 09/27/22  1:06 AM  Result Value Ref Range   WBC 3.1 (L) 4.0 - 10.5 K/uL   RBC 3.56 (L) 4.22 - 5.81 MIL/uL   Hemoglobin 10.8 (L) 13.0 - 17.0 g/dL   HCT 32.1 (L) 39.0 - 52.0 %   MCV 90.2 80.0 - 100.0 fL   MCH 30.3 26.0 - 34.0 pg   MCHC 33.6 30.0 - 36.0  g/dL   RDW 15.9 (H) 11.5 - 15.5 %   Platelets 65 (L) 150 - 400 K/uL   nRBC 0.0 0.0 - 0.2 %   Neutrophils Relative % 63 %   Neutro Abs 2.0 1.7 - 7.7 K/uL   Lymphocytes Relative 17 %   Lymphs Abs 0.5 (L) 0.7 - 4.0 K/uL   Monocytes Relative 15 %   Monocytes Absolute 0.5 0.1 - 1.0 K/uL   Eosinophils Relative 5 %   Eosinophils Absolute 0.2 0.0 - 0.5 K/uL   Basophils Relative 0 %   Basophils Absolute 0.0 0.0 - 0.1 K/uL   Immature Granulocytes 0 %   Abs Immature Granulocytes 0.01 0.00 - 0.07 K/uL  Comprehensive metabolic panel     Status: Abnormal   Collection Time: 09/27/22  1:06 AM  Result Value Ref Range   Sodium 133 (L) 135 - 145 mmol/L   Potassium 3.7 3.5 - 5.1 mmol/L   Chloride 106 98 - 111 mmol/L   CO2 19 (L) 22 - 32 mmol/L   Glucose, Bld 112 (H) 70 - 99 mg/dL   BUN 17 8 - 23 mg/dL   Creatinine, Ser 1.81 (H) 0.61 - 1.24 mg/dL   Calcium 7.3 (L) 8.9 - 10.3 mg/dL   Total Protein 5.4 (L) 6.5 - 8.1 g/dL   Albumin 2.0 (L) 3.5 - 5.0 g/dL   AST 35 15 - 41 U/L   ALT 30 0 - 44 U/L    Alkaline Phosphatase 106 38 - 126 U/L   Total Bilirubin 0.9 0.3 - 1.2 mg/dL   GFR, Estimated 36 (L) >60 mL/min   Anion gap 8 5 - 15  Magnesium     Status: None   Collection Time: 09/27/22  1:06 AM  Result Value Ref Range   Magnesium 1.8 1.7 - 2.4 mg/dL    I have reviewed pertinent nursing notes, vitals, labs, and images as necessary. I have ordered labwork to follow up on as indicated.  I have reviewed the last notes from staff over past 24 hours. I have discussed patient's care plan and test results with nursing staff, CM/SW, and other staff as appropriate.  Time spent: Greater than 50% of the 55 minute visit was spent in counseling/coordination of care for the patient as laid out in the A&P.   LOS: 3 days   Dwyane Dee, MD [facility] 09/27/2022, 12:01 PM

## 2022-09-28 ENCOUNTER — Ambulatory Visit: Payer: Self-pay

## 2022-09-28 LAB — COMPREHENSIVE METABOLIC PANEL
ALT: 31 U/L (ref 0–44)
AST: 41 U/L (ref 15–41)
Albumin: 1.9 g/dL — ABNORMAL LOW (ref 3.5–5.0)
Alkaline Phosphatase: 110 U/L (ref 38–126)
Anion gap: 7 (ref 5–15)
BUN: 18 mg/dL (ref 8–23)
CO2: 21 mmol/L — ABNORMAL LOW (ref 22–32)
Calcium: 7.2 mg/dL — ABNORMAL LOW (ref 8.9–10.3)
Chloride: 107 mmol/L (ref 98–111)
Creatinine, Ser: 2.04 mg/dL — ABNORMAL HIGH (ref 0.61–1.24)
GFR, Estimated: 31 mL/min — ABNORMAL LOW (ref >60)
Glucose, Bld: 97 mg/dL (ref 70–99)
Potassium: 4 mmol/L (ref 3.5–5.1)
Sodium: 135 mmol/L (ref 135–145)
Total Bilirubin: 0.9 mg/dL (ref 0.3–1.2)
Total Protein: 5.2 g/dL — ABNORMAL LOW (ref 6.5–8.1)

## 2022-09-28 LAB — CBC WITH DIFFERENTIAL/PLATELET
Abs Immature Granulocytes: 0.01 10*3/uL (ref 0.00–0.07)
Basophils Absolute: 0 10*3/uL (ref 0.0–0.1)
Basophils Relative: 0 %
Eosinophils Absolute: 0.2 10*3/uL (ref 0.0–0.5)
Eosinophils Relative: 6 %
HCT: 28.4 % — ABNORMAL LOW (ref 39.0–52.0)
Hemoglobin: 9.4 g/dL — ABNORMAL LOW (ref 13.0–17.0)
Immature Granulocytes: 0 %
Lymphocytes Relative: 16 %
Lymphs Abs: 0.5 10*3/uL — ABNORMAL LOW (ref 0.7–4.0)
MCH: 30.5 pg (ref 26.0–34.0)
MCHC: 33.1 g/dL (ref 30.0–36.0)
MCV: 92.2 fL (ref 80.0–100.0)
Monocytes Absolute: 0.5 10*3/uL (ref 0.1–1.0)
Monocytes Relative: 16 %
Neutro Abs: 1.9 10*3/uL (ref 1.7–7.7)
Neutrophils Relative %: 62 %
Platelets: 59 10*3/uL — ABNORMAL LOW (ref 150–400)
RBC: 3.08 MIL/uL — ABNORMAL LOW (ref 4.22–5.81)
RDW: 15.9 % — ABNORMAL HIGH (ref 11.5–15.5)
WBC: 3 10*3/uL — ABNORMAL LOW (ref 4.0–10.5)
nRBC: 0 % (ref 0.0–0.2)

## 2022-09-28 LAB — MAGNESIUM: Magnesium: 1.8 mg/dL (ref 1.7–2.4)

## 2022-09-28 MED ORDER — SPIRONOLACTONE 50 MG PO TABS: 50.0000 mg | ORAL_TABLET | Freq: Every day | ORAL | 3 refills | Status: DC

## 2022-09-28 NOTE — TOC Transition Note (Signed)
Transition of Care [facility] - CM/SW Discharge Note   Patient Details  Name: Trevor Murphy MRN: [id number] Date of Birth: [birth year]  [facility] CM/SW Contact:  Tom-Johnson, Renea Ee, RN Phone Number: 09/28/2022, 11:09 AM   Clinical Narrative:     Patient is scheduled for discharge today. Home Health info on AVS. Outpatient info and instructions on AVS. Family to transport at discharge. No further TOC needs noted.          Final next level of care: Home w Home Health Services Barriers to Discharge: Barriers Resolved   Patient Goals and CMS Choice CMS Medicare.gov Compare Post Acute Care list provided to:: Patient Choice offered to / list presented to : Patient  Discharge Placement                  Patient to be transferred to facility by: Sister      Discharge Plan and Services Additional resources added to the After Visit Summary for     Discharge Planning Services: CM Consult Post Acute Care Choice: Home Health          DME Arranged: N/A DME Agency: NA       HH Arranged: PT, OT HH Agency: [place] Date [place]: 09/25/22 Time [place]: [id number] Representative spoke with at [place]: [place] Determinants of Health ([place]) Interventions SDOH Screenings   Food Insecurity: No Food Insecurity (09/24/2022)  Housing: Low Risk  (09/24/2022)  Transportation Needs: No Transportation Needs (09/24/2022)  Utilities: Not At Risk (09/24/2022)  Depression (PHQ2-9): Low Risk  (08/06/2022)  Financial Resource Strain: Low Risk  (09/17/2021)  Physical Activity: Inactive (09/17/2021)  Social Connections: Moderately Isolated (04/15/2021)  Stress: No Stress Concern Present (09/17/2021)  Tobacco Use: Low Risk  (09/25/2022)     Readmission Risk Interventions     No data to display

## 2022-09-28 NOTE — Discharge Summary (Signed)
Physician Discharge Summary   Trevor Murphy [id number] DOB: 1935-03-23 DOA: 09/23/2022  PCP: [name], FNP  Admit date: 09/23/2022 Discharge date: 09/28/2022  Admitted From: Home Disposition:  Home Discharging physician: Dwyane Dee, MD Barriers to discharge:   Recommendations for Outpatient Follow-up:  Follow up with GI May need serial paracenteses Follow up with cardiology; monitor renal fxn with Jardiance use Anticoagulation discontinued Reducible hernia; needs to be monitored   Home health: PT/OT  Discharge Condition: stable CODE STATUS: DNR Diet recommendation:  Diet Orders (From admission, onward)     Start     Ordered   09/28/22 0000  Diet - low sodium heart healthy        09/28/22 0853   09/24/22 1609  Diet 2 gram sodium Room service appropriate? Yes; Fluid consistency: Thin  Diet effective now       Question Answer Comment  Room service appropriate? Yes   Fluid consistency: Thin      09/24/22 1608            Hospital Course: [name] is an 88 yo male with PMH CKD 3, HTN, dCHF, PFO, cryptogenic stroke s/p ILR, PAF on eliquis, prior heavy etoh use (quit 2017). He presented with lower extremity edema, abdominal swelling, and generalized malaise. Abdominal ultrasound obtained in the ER showed large volume ascites and nodular liver consistent with cirrhosis.  Gallbladder sludge and possible 10 mm polyp noted.  He also had prior MRCP on 07/14/2022 which also showed extensive ascites and mesenteric edema along with paraesophageal varices and right gastric varices compatible with portal venous hypertension.  Due to these findings, GI was consulted on admission.  Patient was also ordered paracentesis for diagnostic and therapeutic purposes and started on Lasix/spironolactone.  GI was also consulted during hospitalization.   Assessment and Plan: * Cirrhosis of liver with ascites ([place]) - Maybe EtOH cirrhosis given PMH of EtOH abuse? "h/o heavy use, quit in  2017" - s/p paracentesis on 2/15 with 2.1 L fluid removed - GI following, appreciate assistance - continues on spironolactone; on hold is lasix -Limited options in setting of his decompensated cirrhosis.  Not a transplant candidate or TIPS candidate due to comorbidities and severe MR; CKD also limits diuretic use.  Given difficulty with treatment options, palliative care consult recommended which is certainly appropriate in this context -[place] discussions have been held with palliative care. DNR at this time, but continuing treatment as noted; patient and son seem to understand generalized guarded prognosis. MOST form has been filled out as well - d/c statin given no further mortality benefit and contraindication now -Likely will need intermittent outpatient paracenteses as fluid reaccumulates  Chronic kidney disease, stage 3b ([place]) - patient has history of CKD3b. Baseline creat ~ 1.8, eGFR 37-42 -Difficulty with diuresis and labile renal fxn   Paroxysmal atrial fibrillation ([facility]) H/o cryptogenic stroke, ILR placement, PAF found started on eliquis - now he has worsening liver decompensation and severe thrombocytopenia; also discussed with GI - it's feasible that anticoag could be continued very cautiously, but I agree it's still rather high risk. The alternative is not the greatest to holding either; but I will continue to hold for now; discussed with patient and family prior to discharge   Chronic diastolic CHF (congestive heart failure) ([place]) - Last echo reviewed, October 2020: EF 55 to 60%, severe LVH, impaired relaxation -On Jardiance at home (caution with renal function and may need to be discontinued if further worsening of renal function) - Continue spironolactone;  lasix on hold - BNP also elevated - echo repeated: EF 55-60%; mild LVH, indeterminate diastolic parameters; moderately severe to severe MR - will recommend outpatient cardiology followup  Right knee pain - s/p fall at home  prior to admission; now having some worsening pain and swelling today but still able to ambulate - X-ray obtained which shows severe underlying osteoarthritis as expected and a moderate effusion.  Likely traumatic/inflammatory and low suspicion for infectious.  Hold off on any aspiration at this time - Continue supportive care with ice and wrapping as needed - Low-dose Tylenol for pain in setting of cirrhosis -Pain much improved   Pancytopenia ([place]) - Suspect this is due to bone marrow suppression from underlying cirrhosis  Inguinal bulge Reducible hernia. Follow up with gen surg as outpt.  Mixed hyperlipidemia - discontinue statin in setting of decompensated cirrhosis and likely no further mortality benefit at this time   The patient's chronic medical conditions were treated accordingly per the patient's home medication regimen except as noted.  On day of discharge, patient was felt deemed stable for discharge. Patient/family member advised to call PCP or come back to ER if needed.   Principal Diagnosis: Cirrhosis of liver with ascites [facility])  Discharge Diagnoses: Active Hospital Problems   Diagnosis Date Noted   Cirrhosis of liver with ascites ([place]) 09/23/2022    Priority: 1.   Chronic kidney disease, stage 3b ([place]) 07/26/2012    Priority: 2.   Paroxysmal atrial fibrillation ([place]) 06/07/2020    Priority: 3.   Chronic diastolic CHF (congestive heart failure) ([place]) 01/30/2014    Priority: 4.   Right knee pain 12/19/2013    Priority: 5.   Failure to thrive in adult 09/26/2022   Renal insufficiency 09/26/2022   Pancytopenia ([place]) 09/24/2022   Inguinal bulge 09/23/2022   Mixed hyperlipidemia 08/12/2020    Resolved Hospital Problems  No resolved problems to display.     Discharge Instructions     Diet - low sodium heart healthy   Complete by: As directed    Increase activity slowly   Complete by: As directed       Allergies as of 09/28/2022   No Known Allergies       Medication List     STOP taking these medications    apixaban 2.5 MG Tabs tablet Commonly known as: Eliquis   atorvastatin 80 MG tablet Commonly known as: LIPITOR       TAKE these medications    acetaminophen 500 MG tablet Commonly known as: TYLENOL Take 1,000 mg by mouth 2 (two) times daily as needed for mild pain or moderate pain (leg pain).   dapagliflozin propanediol 10 MG Tabs tablet Commonly known as: [name] Take 1 tablet (10 mg total) by mouth daily before breakfast.   ferrous sulfate 325 (65 FE) MG tablet Commonly known as: FeroSul TAKE 1 TABLET(325 MG) BY MOUTH DAILY What changed:  how much to take how to take this when to take this additional instructions   finasteride 5 MG tablet Commonly known as: PROSCAR Take 5 mg by mouth daily.   Lumigan 0.01 % Soln Generic drug: bimatoprost Place 1 drop into both eyes at bedtime.   Simbrinza 1-0.2 % Susp Generic drug: Brinzolamide-Brimonidine Follow up with your PCP on when to restart this medication. What changed:  how much to take how to take this when to take this additional instructions   spironolactone 50 MG tablet Commonly known as: ALDACTONE Take 1 tablet (50 mg total) by mouth  daily. Start taking on: September 29, 2022               Durable Medical Equipment  (From admission, onward)           Start     Ordered   09/25/22 1439  For home use only DME Walker rolling  Once       Question Answer Comment  Walker: With 5 Inch Wheels   Patient needs a walker to treat with the following condition Gait instability      09/25/22 1440            Follow-up Information     [facility], [facility] Follow up.   Specialty: Home Health Services Why: Someone will call you to schedule first home visit. If you have not received a call after two days of discharging home, call their number listed. If no one comes to assess, call Case Manager at [phone] Contact information: [place] [address] [phone]         Carol Ada, MD. Schedule an appointment as soon as possible for a visit in 2 week(s).   Specialty: Gastroenterology Contact information: [place], [place] [address] [phone]                No Known Allergies  Consultations: GI  Procedures:   Discharge Exam: BP 126/61 (BP Location: Left Arm)   Pulse 72   Temp 97.8 F (36.6 C) (Oral)   Resp 18   Ht 5' 10"$  (1.778 m)   Wt 65.7 kg   SpO2 100%   BMI 20.78 kg/m  Physical Exam Constitutional:      General: He is not in acute distress.    Appearance: Normal appearance.  HENT:     Head: Normocephalic and atraumatic.     Mouth/Throat:     Mouth: Mucous membranes are moist.  Eyes:     Extraocular Movements: Extraocular movements intact.  Cardiovascular:     Rate and Rhythm: Normal rate.  Pulmonary:     Effort: Pulmonary effort is normal. No respiratory distress.     Breath sounds: Normal breath sounds. No wheezing.  Abdominal:     General: Bowel sounds are normal. There is distension.     Palpations: Abdomen is soft.     Tenderness: There is no abdominal tenderness.  Musculoskeletal:        General: Swelling present. Normal range of motion.     Cervical back: Normal range of motion and neck supple.     Comments: 1 + B/L LE edema.  Improved right knee swelling and tenderness  Skin:    General: Skin is warm and dry.  Neurological:     General: No focal deficit present.     Mental Status: He is alert.  Psychiatric:        Mood and Affect: Mood normal.      The results of significant diagnostics from this hospitalization (including imaging, microbiology, ancillary and laboratory) are listed below for reference.   Microbiology: Recent Results (from the past 240 hour(s))  Gram stain     Status: None   Collection Time: 09/24/22  2:06 PM   Specimen: Abdomen; Peritoneal Fluid  Result Value Ref Range Status    Specimen Description ABDOMEN PERITONEAL  Final   Special Requests NONE  Final   Gram Stain   Final    WBC PRESENT, PREDOMINANTLY MONONUCLEAR NO ORGANISMS SEEN CYTOSPIN SMEAR Performed at [facility]  Lab, [address], [place], [place] [address]    Report Status 09/24/2022 FINAL  Final  Culture, body fluid w Gram Stain-bottle     Status: None (Preliminary result)   Collection Time: 09/24/22  2:06 PM   Specimen: Abdomen  Result Value Ref Range Status   Specimen Description ABDOMEN PERITONEAL  Final   Special Requests NONE  Final   Culture   Final    NO GROWTH 4 DAYS Performed at [facility] [address], [place], [place] [address]    Report Status PENDING  Incomplete     Labs: BNP (last 3 results) Recent Labs    09/23/22 1056  BNP 123XX123*   Basic Metabolic Panel: Recent Labs  Lab 09/24/22 0121 09/25/22 0323 09/26/22 0222 09/27/22 0106 09/28/22 0206  NA 141 138 135 133* 135  K 4.1 3.7 3.7 3.7 4.0  CL 113* 111 108 106 107  CO2 20* 20* 20* 19* 21*  GLUCOSE 108* 98 94 112* 97  BUN 16 16 15 17 18  $ CREATININE 1.91* 1.82* 1.80* 1.81* 2.04*  CALCIUM 7.7* 7.4* 7.2* 7.3* 7.2*  MG  --  1.8 1.7 1.8 1.8   Liver Function Tests: Recent Labs  Lab 09/24/22 0121 09/25/22 0323 09/26/22 0222 09/27/22 0106 09/28/22 0206  AST 44* 38 33 35 41  ALT 37 30 25 30 31  $ ALKPHOS 120 106 92 106 110  BILITOT 1.0 0.6 0.6 0.9 0.9  PROT 5.9* 5.3* 4.7* 5.4* 5.2*  ALBUMIN 2.1* 1.9* 1.7* 2.0* 1.9*   No results for input(s): "LIPASE", "AMYLASE" in the last 168 hours. Recent Labs  Lab 09/24/22 0121  AMMONIA 21   CBC: Recent Labs  Lab 09/24/22 0121 09/25/22 0323 09/26/22 0222 09/27/22 0106 09/28/22 0206  WBC 3.0* 2.8* 2.7* 3.1* 3.0*  NEUTROABS  --  1.7 1.7 2.0 1.9  HGB 10.7* 10.6* 9.9* 10.8* 9.4*  HCT 32.6* 32.1* 29.6* 32.1* 28.4*  MCV 92.6 92.2 90.8 90.2 92.2  PLT 59* 54* 52* 65* 59*   Cardiac Enzymes: No results for input(s): "CKTOTAL", "CKMB", "CKMBINDEX",  "TROPONINI" in the last 168 hours. BNP: Invalid input(s): "POCBNP" CBG: No results for input(s): "GLUCAP" in the last 168 hours. D-Dimer No results for input(s): "DDIMER" in the last 72 hours. Hgb A1c No results for input(s): "HGBA1C" in the last 72 hours. Lipid Profile No results for input(s): "CHOL", "HDL", "LDLCALC", "TRIG", "CHOLHDL", "LDLDIRECT" in the last 72 hours. Thyroid function studies No results for input(s): "TSH", "T4TOTAL", "T3FREE", "THYROIDAB" in the last 72 hours.  Invalid input(s): "FREET3" Anemia work up No results for input(s): "VITAMINB12", "FOLATE", "FERRITIN", "TIBC", "IRON", "RETICCTPCT" in the last 72 hours. Urinalysis    Component Value Date/Time   COLORURINE YELLOW 09/23/2022 1614   APPEARANCEUR HAZY (A) 09/23/2022 1614   LABSPEC 1.025 09/23/2022 1614   PHURINE 6.0 09/23/2022 1614   GLUCOSEU >=500 (A) 09/23/2022 1614   HGBUR MODERATE (A) 09/23/2022 1614   BILIRUBINUR NEGATIVE 09/23/2022 1614   BILIRUBINUR Negative 04/20/2022 1137   KETONESUR 5 (A) 09/23/2022 1614   PROTEINUR 100 (A) 09/23/2022 1614   UROBILINOGEN >=8.0 (A) 04/20/2022 1137   UROBILINOGEN 2.0 (H) 10/17/2021 1222   NITRITE NEGATIVE 09/23/2022 1614   LEUKOCYTESUR NEGATIVE 09/23/2022 1614   Sepsis Labs Recent Labs  Lab 09/25/22 0323 09/26/22 0222 09/27/22 0106 09/28/22 0206  WBC 2.8* 2.7* 3.1* 3.0*   Microbiology Recent Results (from the past 240 hour(s))  Gram stain     Status: None   Collection Time:  09/24/22  2:06 PM   Specimen: Abdomen; Peritoneal Fluid  Result Value Ref Range Status   Specimen Description ABDOMEN PERITONEAL  Final   Special Requests NONE  Final   Gram Stain   Final    WBC PRESENT, PREDOMINANTLY MONONUCLEAR NO ORGANISMS SEEN CYTOSPIN SMEAR Performed at [facility], [place] [address] [place], [place] [address]    Report Status 09/24/2022 FINAL  Final  Culture, body fluid w Gram Stain-bottle     Status: None (Preliminary result)   Collection  Time: 09/24/22  2:06 PM   Specimen: Abdomen  Result Value Ref Range Status   Specimen Description ABDOMEN PERITONEAL  Final   Special Requests NONE  Final   Culture   Final    NO GROWTH 4 DAYS Performed at [facility] [address], [place], [place] [address]    Report Status PENDING  Incomplete    Procedures/Studies: DG Knee Complete 4 Views Right  Result Date: 09/26/2022 CLINICAL DATA:  [id number] Fall 190176 EXAM: RIGHT KNEE - COMPLETE 4+ VIEW COMPARISON:  12/18/2013 FINDINGS: Progressive cartilage loss in the lateral compartment with bone on bone apposition, some remodeling of the tibial plateau. No definite acute fracture. No dislocation. Moderate knee effusion. There is narrowing of the articular cartilage in the medial compartment with chondrocalcinosis. IMPRESSION: 1. Progressive cartilage loss in the lateral compartment with bone on bone apposition. 2. Moderate knee effusion. Electronically Signed   By: Lucrezia Europe M.D.   On: 09/26/2022 11:40   IR Paracentesis  Result Date: 09/25/2022 INDICATION: 88 year old male with abdominal distention, ascites. Request made for diagnostic and therapeutic paracentesis. EXAM: ULTRASOUND GUIDED DIAGNOSTIC AND THERAPEUTIC PARACENTESIS MEDICATIONS: 10 mL 1% lidocaine COMPLICATIONS: None immediate. PROCEDURE: Informed written consent was obtained from the patient after a discussion of the risks, benefits and alternatives to treatment. A timeout was performed prior to the initiation of the procedure. Initial ultrasound scanning demonstrates a moderate amount of ascites within the left lateral abdomen. The left lateral abdomen was prepped and draped in the usual sterile fashion. 1% lidocaine was used for local anesthesia. Following this, a 19 gauge, 7-cm, Yueh catheter was introduced. An ultrasound image was saved for documentation purposes. The paracentesis was performed. The catheter was removed and a dressing was applied. The patient tolerated the  procedure well without immediate post procedural complication. FINDINGS: A total of approximately 2.1 liters of clear, yellow fluid was removed. Samples were sent to the laboratory as requested by the clinical team. IMPRESSION: Successful ultrasound-guided paracentesis yielding 2.1 liters of peritoneal fluid. Read by: Brynda Greathouse PA-C Electronically Signed   By: Corrie Mckusick D.O.   On: 09/25/2022 10:15   ECHOCARDIOGRAM COMPLETE  Result Date: 09/24/2022    ECHOCARDIOGRAM REPORT   Patient Name:   Trevor Murphy Date of Exam: 09/24/2022 Medical Rec #:  [id number]    Height:       70.0 in Accession #:    [id number]   Weight:       144.8 lb Date of Birth:  September 14, 1934    BSA:          1.820 m Patient Age:    41 years     BP:           142/76 mmHg Patient Gender: M            HR:           48 bpm. Exam Location:  Inpatient Procedure: 2D Echo, 3D Echo, Cardiac  Doppler and Color Doppler Indications:    I50.40* Unspecified combined systolic (congestive) and diastolic                 (congestive) heart failure  History:        Patient has prior history of Echocardiogram examinations, most                 recent 05/24/2019. CHF, CAD, Abnormal ECG, Stroke,                 Arrythmias:Bradycardia, Atrial Fibrillation and Tachycardia,                 Signs/Symptoms:Edema; Risk Factors:Hypertension and                 Dyslipidemia. ETOH. PFO.  Sonographer:    Roseanna Rainbow RDCS Referring Phys: [place]  Sonographer Comments: Study delayed for patient care. IMPRESSIONS  1. Proximal septal thickening, Basal inferior hypokinesis. Left ventricular ejection fraction, by estimation, is 55 to 60%. The left ventricle has normal function. There is mild left ventricular hypertrophy. Left ventricular diastolic parameters are indeterminate.  2. Right ventricular systolic function is normal. The right ventricular size is normal.  3. MR is somewhat eccentric. It appears at least moderately severe to severe. Consider TEE to further  evaluate mechanism/severity. The mitral valve is normal in structure.  4. The aortic valve is tricuspid. Aortic valve regurgitation is moderate. Aortic valve sclerosis is present, with no evidence of aortic valve stenosis. FINDINGS  Left Ventricle: Proximal septal thickening, Basal inferior hypokinesis. Left ventricular ejection fraction, by estimation, is 55 to 60%. The left ventricle has normal function. The left ventricular internal cavity size was normal in size. There is mild left ventricular hypertrophy. Left ventricular diastolic parameters are indeterminate. Right Ventricle: The right ventricular size is normal. Right vetricular wall thickness was not assessed. Right ventricular systolic function is normal. Left Atrium: Left atrial size was normal in size. Right Atrium: Right atrial size was normal in size. Pericardium: Trivial pericardial effusion is present. Mitral Valve: MR is somewhat eccentric. It appears at least moderately severe to severe. Consider TEE to further evaluate mechanism/severity. The mitral valve is normal in structure. There is mild thickening of the mitral valve leaflet(s). Mild mitral annular calcification. Tricuspid Valve: The tricuspid valve is normal in structure. Tricuspid valve regurgitation is mild. Aortic Valve: The aortic valve is tricuspid. Aortic valve regurgitation is moderate. Aortic regurgitation PHT measures 878 msec. Aortic valve sclerosis is present, with no evidence of aortic valve stenosis. Pulmonic Valve: The pulmonic valve was thickened with good excursion. Pulmonic valve regurgitation is mild. Aorta: The aortic root and ascending aorta are structurally normal, with no evidence of dilitation. IAS/Shunts: No atrial level shunt detected by color flow Doppler.  LEFT VENTRICLE PLAX 2D LVIDd:         4.60 cm      Diastology LVIDs:         3.50 cm      LV e' medial:    5.66 cm/s LV PW:         1.30 cm      LV E/e' medial:  13.1 LV IVS:        1.30 cm      LV e' lateral:    5.11 cm/s LVOT diam:     2.60 cm      LV E/e' lateral: 14.5 LV SV:         102 LV SV Index:  56 LVOT Area:     5.31 cm                              3D Volume EF: LV Volumes (MOD)            3D EF:        63 % LV vol d, MOD A4C: 114.0 ml LV EDV:       132 ml LV vol s, MOD A2C: 37.6 ml  LV ESV:       48 ml LV vol s, MOD A4C: 69.1 ml  LV SV:        83 ml LV SV MOD A4C:     114.0 ml RIGHT VENTRICLE             IVC RV S prime:     12.10 cm/s  IVC diam: 1.70 cm TAPSE (M-mode): 1.8 cm LEFT ATRIUM             Index        RIGHT ATRIUM           Index LA diam:        3.40 cm 1.87 cm/m   RA Area:     10.00 cm LA Vol (A2C):   24.3 ml 13.35 ml/m  RA Volume:   17.70 ml  9.73 ml/m LA Vol (A4C):   40.2 ml 22.09 ml/m LA Biplane Vol: 35.6 ml 19.56 ml/m  AORTIC VALVE             PULMONIC VALVE LVOT Vmax:   69.50 cm/s  PR End Diast Vel: 2.01 msec LVOT Vmean:  44.800 cm/s LVOT VTI:    0.192 m AI PHT:      878 msec  AORTA Ao Root diam: 4.05 cm Ao Asc diam:  3.90 cm MITRAL VALVE                  TRICUSPID VALVE MV Area (PHT): 2.16 cm       TR Peak grad:   32.5 mmHg MV Decel Time: 351 msec       TR Vmax:        285.00 cm/s MR Peak grad:    111.1 mmHg MR Mean grad:    70.0 mmHg    SHUNTS MR Vmax:         527.00 cm/s  Systemic VTI:  0.19 m MR Vmean:        385.0 cm/s   Systemic Diam: 2.60 cm MR PISA:         3.08 cm MR PISA Eff ROA: 22 mm MR PISA Radius:  0.70 cm MV E velocity: 74.25 cm/s MV A velocity: 71.10 cm/s MV E/A ratio:  1.04 Dorris Carnes MD Electronically signed by Dorris Carnes MD Signature Date/Time: 09/24/2022/5:36:28 PM    Final    US Abdomen Complete  Result Date: 09/23/2022 CLINICAL DATA:  Ascites EXAM: ABDOMEN ULTRASOUND COMPLETE COMPARISON:  MRI 07/14/2022, ultrasound 04/30/2022 FINDINGS: Gallbladder: No shadowing stones. Sludge within the gallbladder. Polyp versus tumefactive sludge measuring up to 10 mm. Upper normal wall thickness. Negative sonographic Murphy Common bile duct: Diameter: 2.6 mm Liver:  Heterogenous slightly nodular appearing liver suspicious for cirrhosis. Portal vein is patent on color Doppler imaging with normal direction of blood flow towards the liver. IVC: No abnormality visualized. Pancreas: Poorly visualized due to bowel gas Spleen: Size and appearance within normal limits. Right Kidney: Length: 7 cm. Cortex is echogenic.  Cyst at the upper pole measuring 2.5 cm, no imaging follow-up is recommended. No hydronephrosis Left Kidney: Length: 7.8 cm. Echogenic cortex. No mass or hydronephrosis. Abdominal aorta: No aneurysm visualized. Other findings: Large volume of ascites. IMPRESSION: 1. Large volume of ascites. 2. Heterogenous slightly nodular liver suspicious for cirrhosis. 3. Sludge within the gallbladder. 10 mm polyp versus tumefactive sludge. 4. Small echogenic kidneys consistent with medical renal disease. Electronically Signed   By: Donavan Foil M.D.   On: 09/23/2022 18:42   DG Chest 2 View  Result Date: 09/23/2022 CLINICAL DATA:  congestive heart failure EXAM: CHEST - 2 VIEW COMPARISON:  10/11/2020 FINDINGS: The heart size and mediastinal contours are within normal limits. Both lungs are clear. The visualized skeletal structures are unremarkable except for degenerative changes of the spine with a similar mild scoliosis. Bones are osteopenic. Cardiac loop recorder over the chest as before. Aorta atherosclerotic. IMPRESSION: No active cardiopulmonary disease. Electronically Signed   By: Jerilynn Mages.  Shick M.D.   On: 09/23/2022 11:36   CUP PACEART REMOTE DEVICE CHECK  Result Date: 09/13/2022 ILR summary report received. Battery status OK. Normal device function. No new symptom, tachy, brady, or pause episodes. No new AF episodes. AF burden is 0% of the time.  Monthly summary reports and ROV/PRN Kathy Breach, RN, CCDS, [employer]    Time coordinating discharge: Over 30 minutes    Dwyane Dee, MD  [facility] 09/28/2022, 12:17 PM

## 2022-09-28 NOTE — Patient Outreach (Signed)
  Care Coordination   09/28/2022 Name: MANVEER SORLIE MRN: [id number] DOB: 02/18/1935   Care Coordination Outreach Attempts:  An unsuccessful telephone outreach was attempted for a scheduled appointment today.  Follow Up Plan:  Additional outreach attempts will be made to offer the patient care coordination information and services.   Encounter Outcome:  No Answer   Care Coordination Interventions:  No, not indicated    Barb Merino, RN, BSN, CCM Care Management Coordinator [facility] Care Management  Direct Phone: [phone]

## 2022-09-28 NOTE — Progress Notes (Addendum)
This chaplain responded to PMT NP-Audrey consult for spiritual care in the context of creating/updating the Pt. Advance Directive.   The Pt. wakes up to the call of his name and updates the chaplain of his d/c later today. The chaplain understands the Pt. sister and son will be present around 1:30pm.The Pt. is hopeful the chaplain can visit and provide AD education at this time.  The chaplain will update the Pt. RN-Lynn.  **1448 The chaplain is present with the Pt., Pt. Son-Trevor Murphy, notary and witnesses for the notarizing of the Pt. Advance Directive:  HCPOA. The Pt. Did not create a Living Will.  The Pt. named Trevor Murphy as his healthcare agent. If this person is unwilling or unable to serve in this role the Pt. next choice is Trevor Murphy.  The chaplain gave the Pt. the original AD along with two copies. The chaplain scanned the Pt. AD into the Pt. EMR.  Chaplain Sallyanne Kuster [phone]

## 2022-09-28 NOTE — Care Management Important Message (Signed)
Important Message  Patient Details  Name: Trevor Murphy MRN: [id number] Date of Birth: October 29, 1934   Medicare Important Message Given:  Yes     Shanasia Ibrahim Montine Circle 09/28/2022, 3:31 PM

## 2022-09-28 NOTE — Progress Notes (Signed)
Occupational Therapy Treatment Patient Details Name: Trevor Murphy MRN: [id number] DOB: 03/28/1935 Today's Date: 09/28/2022   History of present illness Pt is an 88 y.o. male who presented 09/23/22 with bil lower extremity and abdominal swelling. Pt with cirrhosis of liver with ascites. PMH: blind L eye, SVT; PAF; HLD; HTN; ETOH abuse(quit in 2017); CVA (2013); stage 3 CKD; CAD; and chronic diastolic CHF   OT comments  Pt making good progress with functional goals, is pleasant and cooperative. OT will continue to follow acutely to maximize level of function and safety to return home   Recommendations for follow up therapy are one component of a multi-disciplinary discharge planning process, led by the attending physician.  Recommendations may be updated based on patient status, additional functional criteria and insurance authorization.    Follow Up Recommendations  Home health OT     Assistance Recommended at Discharge Frequent or constant Supervision/Assistance  Patient can return home with the following  A little help with walking and/or transfers;A little help with bathing/dressing/bathroom;Assistance with cooking/housework;Direct supervision/assist for medications management;Direct supervision/assist for financial management;Assist for transportation;Help with stairs or ramp for entrance   Equipment Recommendations       Recommendations for Other Services      Precautions / Restrictions Precautions Precautions: Fall Precaution Comments: low vision Restrictions Weight Bearing Restrictions: No       Mobility Bed Mobility Overal bed mobility: Needs Assistance Bed Mobility: Supine to Sit     Supine to sit: Supervision     General bed mobility comments: supervision for safety. Requires extra time, cues for using rail    Transfers Overall transfer level: Needs assistance Equipment used: Rolling walker (2 wheels) Transfers: Sit to/from Stand Sit to Stand: Min assist            General transfer comment: Min A to power up. Reviewed hand placement.     Balance Overall balance assessment: Needs assistance Sitting-balance support: No upper extremity supported Sitting balance-Leahy Scale: Good     Standing balance support: Bilateral upper extremity supported, During functional activity Standing balance-Leahy Scale: Poor                             ADL either performed or assessed with clinical judgement   ADL Overall ADL's : Needs assistance/impaired     Grooming: Wash/dry hands;Wash/dry face;Sitting;Set up           Upper Body Dressing : Min guard;Sitting   Lower Body Dressing: Minimal assistance;Sit to/from stand       Toileting- Water quality scientist and Hygiene: Min guard;Sit to/from stand;Cueing for safety;Cueing for sequencing       Functional mobility during ADLs: Minimal assistance;Cueing for safety;Rolling walker (2 wheels)      Extremity/Trunk Assessment Upper Extremity Assessment Upper Extremity Assessment: Overall WFL for tasks assessed   Lower Extremity Assessment Lower Extremity Assessment: Defer to PT evaluation   Cervical / Trunk Assessment Cervical / Trunk Assessment: Kyphotic    Vision Baseline Vision/History: 2 Legally blind Patient Visual Report: No change from baseline     Perception     Praxis      Cognition Arousal/Alertness: Awake/alert Behavior During Therapy: WFL for tasks assessed/performed Overall Cognitive Status: Within Functional Limits for tasks assessed  Exercises      Shoulder Instructions       General Comments      Pertinent Vitals/ Pain       Pain Assessment Pain Assessment: No/denies pain Faces Pain Scale: No hurt  Home Living                                          Prior Functioning/Environment              Frequency  Min 2X/week        Progress Toward Goals  OT  Goals(current goals can now be found in the care plan section)  Progress towards OT goals: Progressing toward goals     Plan Discharge plan remains appropriate    Co-evaluation                 AM-PAC OT "6 Clicks" Daily Activity     Outcome Measure   Help from another person eating meals?: None (set up of tray due to low vision) Help from another person taking care of personal grooming?: A Little Help from another person toileting, which includes using toliet, bedpan, or urinal?: A Little Help from another person bathing (including washing, rinsing, drying)?: A Little Help from another person to put on and taking off regular upper body clothing?: A Little Help from another person to put on and taking off regular lower body clothing?: A Little 6 Click Score: 19    End of Session Equipment Utilized During Treatment: Rolling walker (2 wheels);Gait belt  OT Visit Diagnosis: Unsteadiness on feet (R26.81);Other abnormalities of gait and mobility (R26.89);Muscle weakness (generalized) (M62.81);Low vision, both eyes (H54.2)   Activity Tolerance     Patient Left in chair;with call bell/phone within reach;with chair alarm set   Nurse Communication          Time: [id number] OT Time Calculation (min): 24 min  Charges: OT General Charges $OT Visit: 1 Visit OT Treatments $Self Care/Home Management : 8-22 mins $Therapeutic Activity: 8-22 mins   Britt Bottom 09/28/2022, 1:35 PM

## 2022-09-29 ENCOUNTER — Telehealth: Payer: Self-pay

## 2022-09-29 LAB — CULTURE, BODY FLUID W GRAM STAIN -BOTTLE: Culture: NO GROWTH

## 2022-09-29 LAB — PATHOLOGIST SMEAR REVIEW

## 2022-09-29 NOTE — Transitions of Care (Post Inpatient/ED Visit) (Signed)
   09/29/2022  Name: ZAIVEN OSTERHOUDT MRN: [id number] DOB: 1935/02/06  Today's TOC FU Call Status: Today's TOC FU Call Status:: Unsuccessul Call (1st Attempt) Unsuccessful Call (1st Attempt) Date: 09/29/22  Attempted to reach the patient regarding the most recent Inpatient/ED visit.  Follow Up Plan: Additional outreach attempts will be made to reach the patient to complete the Transitions of Care (Post Inpatient/ED visit) call.   Signature  Tami Lin, [place]

## 2022-09-29 NOTE — Transitions of Care (Post Inpatient/ED Visit) (Signed)
   09/29/2022  Name: Trevor Murphy MRN: [id number] DOB: 11-29-1934  Today's TOC FU Call Status: Today's TOC FU Call Status:: Unsuccessul Call (1st Attempt) Unsuccessful Call (1st Attempt) Date: 09/29/22  Attempted to reach the patient regarding the most recent Inpatient/ED visit.  Follow Up Plan: Additional outreach attempts will be made to reach the patient to complete the Transitions of Care (Post Inpatient/ED visit) call.     Enzo Montgomery, RN,BSN,CCM [place] Management Telephonic Care Management Coordinator Direct Phone: [phone] Toll Free: [phone] Fax: [phone]

## 2022-09-30 ENCOUNTER — Telehealth: Payer: Self-pay

## 2022-09-30 ENCOUNTER — Telehealth: Payer: Self-pay | Admitting: Nurse Practitioner

## 2022-09-30 DIAGNOSIS — K7460 Unspecified cirrhosis of liver: Secondary | ICD-10-CM | POA: Diagnosis not present

## 2022-09-30 DIAGNOSIS — R188 Other ascites: Secondary | ICD-10-CM | POA: Diagnosis not present

## 2022-09-30 DIAGNOSIS — I5032 Chronic diastolic (congestive) heart failure: Secondary | ICD-10-CM | POA: Diagnosis not present

## 2022-09-30 DIAGNOSIS — I130 Hypertensive heart and chronic kidney disease with heart failure and stage 1 through stage 4 chronic kidney disease, or unspecified chronic kidney disease: Secondary | ICD-10-CM | POA: Diagnosis not present

## 2022-09-30 DIAGNOSIS — M1711 Unilateral primary osteoarthritis, right knee: Secondary | ICD-10-CM | POA: Diagnosis not present

## 2022-09-30 LAB — MISC LABCORP TEST (SEND OUT): Labcorp test code: 9985

## 2022-09-30 NOTE — Telephone Encounter (Signed)
Contacted Trevor Murphy to schedule their annual wellness visit. Appointment made for 10/14/21.  Trevor Murphy AWV direct phone # [phone]

## 2022-09-30 NOTE — Transitions of Care (Post Inpatient/ED Visit) (Signed)
   09/30/2022  Name: Trevor Murphy MRN: [id number] DOB: Dec 10, 1934  Today's TOC FU Call Status: Today's TOC FU Call Status:: Unsuccessful Call (2nd Attempt) Unsuccessful Call (2nd Attempt) Date: 09/30/22  Attempted to reach the patient regarding the most recent Inpatient/ED visit.  Follow Up Plan: Additional outreach attempts will be made to reach the patient to complete the Transitions of Care (Post Inpatient/ED visit) call.     Enzo Montgomery, RN,BSN,CCM [facility] Care Management Care Management Community Coordinator Direct Phone: [phone] Toll Free: [phone] Fax: [phone]

## 2022-09-30 NOTE — Telephone Encounter (Signed)
Home health with [name] called with considers of CHF, he has swelling in feet, he isn't currently weighing. Still has swelling in belly. PT orders, he also would need a home health nurse for diseases and medications.  [name]

## 2022-09-30 NOTE — Transitions of Care (Post Inpatient/ED Visit) (Signed)
   09/30/2022  Name: Trevor Murphy MRN: [id number] DOB: 12/08/1934  Today's TOC FU Call Status: Today's TOC FU Call Status:: Successful TOC FU Call Competed TOC FU Call Complete Date: 09/30/22  Transition Care Management Follow-up Telephone Call Date of Discharge: 09/28/22 [facility]charge [facility]) Type of Discharge: Inpatient Admission Primary Inpatient Discharge Diagnosis:: "failure to thrive" How have you been since you were released from the hospital?: Better (call completed with both patient and his sister) Any questions or concerns?: No  Items Reviewed: Did you receive and understand the discharge instructions provided?: Yes Medications obtained and verified?: Yes (Medications Reviewed) Any new allergies since your discharge?: No Dietary orders reviewed?: Yes Type of Diet Ordered:: low salt/heart healthy Do you have support at home?: Yes People in Home: sibling(s) Name of Support/Comfort Primary Source: [place] and Equipment/Supplies: [place] Ordered?: Yes Name of Ashley:: [place] set up a time to come to your home?: Yes [place] Visit Date: 09/30/22 (patient reports he is expecting Kamiah to arrive at his home at any moment now) Any new equipment or medical supplies ordered?: No  Functional Questionnaire: Do you need assistance with bathing/showering or dressing?: No Do you need assistance with meal preparation?: Yes Do you need assistance with eating?: No Do you have difficulty maintaining continence: No Do you need assistance with getting out of bed/getting out of a chair/moving?: No Do you have difficulty managing or taking your medications?: Yes (sister reports she is filling med planner)  Nehemiah Massed up appointments reviewed: PCP Follow-up appointment confirmed?: NA (pt just saw PCP last week-not advised to follow up at this time) [facility] Follow-up appointment confirmed?: No Reason Specialist  Follow-Up Not Confirmed: Patient has Specialist Provider Number and will Call for Appointment (sister states she will call office and make an appt) Do you need transportation to your follow-up appointment?: No Do you understand care options if your condition(s) worsen?: Yes-patient verbalized understanding  SDOH Interventions Today    Flowsheet Row Most Recent Value  SDOH Interventions   Food Insecurity Interventions Intervention Not Indicated  Transportation Interventions Intervention Not Indicated      TOC Interventions Today    Flowsheet Row Most Recent Value  TOC Interventions   TOC Interventions Discussed/Reviewed TOC Interventions Discussed       Interventions Today    Flowsheet Row Most Recent Value  Education Interventions   Education Provided Provided Education  Provided Verbal Education On Nutrition, When to see the doctor  Nutrition Interventions   Nutrition Discussed/Reviewed Nutrition Discussed, Decreasing salt  Pharmacy Interventions   Pharmacy Dicussed/Reviewed Pharmacy Topics Discussed, Medications and their functions  Safety Interventions   Safety Discussed/Reviewed Safety Discussed, Fall Risk       Hetty Blend [facility] Care Management Care Management Community Coordinator Direct Phone: [phone] Toll Free: [phone] Fax: [phone]

## 2022-10-02 ENCOUNTER — Telehealth: Payer: Self-pay

## 2022-10-02 NOTE — Transitions of Care (Post Inpatient/ED Visit) (Signed)
   10/02/2022  Name: Trevor Murphy MRN: [id number] DOB: 23-Jan-1935  Today's TOC FU Call Status: Today's TOC FU Call Status:: Unsuccessful Call (3rd Attempt) Unsuccessful Call (3rd Attempt) Date: 10/02/22  Attempted to reach the patient regarding the most recent Inpatient/ED visit.  Follow Up Plan: No further outreach attempts will be made at this time. We have been unable to contact the patient.  Signature Tami Lin, cma

## 2022-10-05 DIAGNOSIS — K7460 Unspecified cirrhosis of liver: Secondary | ICD-10-CM | POA: Diagnosis not present

## 2022-10-05 DIAGNOSIS — R188 Other ascites: Secondary | ICD-10-CM | POA: Diagnosis not present

## 2022-10-05 DIAGNOSIS — I5032 Chronic diastolic (congestive) heart failure: Secondary | ICD-10-CM | POA: Diagnosis not present

## 2022-10-05 DIAGNOSIS — I130 Hypertensive heart and chronic kidney disease with heart failure and stage 1 through stage 4 chronic kidney disease, or unspecified chronic kidney disease: Secondary | ICD-10-CM | POA: Diagnosis not present

## 2022-10-05 DIAGNOSIS — M1711 Unilateral primary osteoarthritis, right knee: Secondary | ICD-10-CM | POA: Diagnosis not present

## 2022-10-07 DIAGNOSIS — I5032 Chronic diastolic (congestive) heart failure: Secondary | ICD-10-CM | POA: Diagnosis not present

## 2022-10-07 DIAGNOSIS — I130 Hypertensive heart and chronic kidney disease with heart failure and stage 1 through stage 4 chronic kidney disease, or unspecified chronic kidney disease: Secondary | ICD-10-CM | POA: Diagnosis not present

## 2022-10-07 DIAGNOSIS — R188 Other ascites: Secondary | ICD-10-CM | POA: Diagnosis not present

## 2022-10-07 DIAGNOSIS — K7460 Unspecified cirrhosis of liver: Secondary | ICD-10-CM | POA: Diagnosis not present

## 2022-10-07 DIAGNOSIS — M1711 Unilateral primary osteoarthritis, right knee: Secondary | ICD-10-CM | POA: Diagnosis not present

## 2022-10-08 DIAGNOSIS — I5032 Chronic diastolic (congestive) heart failure: Secondary | ICD-10-CM | POA: Diagnosis not present

## 2022-10-08 DIAGNOSIS — M1711 Unilateral primary osteoarthritis, right knee: Secondary | ICD-10-CM | POA: Diagnosis not present

## 2022-10-08 DIAGNOSIS — I130 Hypertensive heart and chronic kidney disease with heart failure and stage 1 through stage 4 chronic kidney disease, or unspecified chronic kidney disease: Secondary | ICD-10-CM | POA: Diagnosis not present

## 2022-10-08 DIAGNOSIS — K7460 Unspecified cirrhosis of liver: Secondary | ICD-10-CM | POA: Diagnosis not present

## 2022-10-08 DIAGNOSIS — R188 Other ascites: Secondary | ICD-10-CM | POA: Diagnosis not present

## 2022-10-12 DIAGNOSIS — K7460 Unspecified cirrhosis of liver: Secondary | ICD-10-CM | POA: Diagnosis not present

## 2022-10-12 DIAGNOSIS — I130 Hypertensive heart and chronic kidney disease with heart failure and stage 1 through stage 4 chronic kidney disease, or unspecified chronic kidney disease: Secondary | ICD-10-CM | POA: Diagnosis not present

## 2022-10-12 DIAGNOSIS — M1711 Unilateral primary osteoarthritis, right knee: Secondary | ICD-10-CM | POA: Diagnosis not present

## 2022-10-12 DIAGNOSIS — R188 Other ascites: Secondary | ICD-10-CM | POA: Diagnosis not present

## 2022-10-12 DIAGNOSIS — I5032 Chronic diastolic (congestive) heart failure: Secondary | ICD-10-CM | POA: Diagnosis not present

## 2022-10-13 ENCOUNTER — Telehealth: Payer: Self-pay | Admitting: *Deleted

## 2022-10-13 NOTE — Progress Notes (Signed)
  Care Coordination Note  10/13/2022 Name: [name] MRN: [id number] DOB: [birth year]  Trevor Murphy is a 88 y.o. year old male who is a primary care patient of [name], [place] and is actively engaged with the care management team. I reached out to [name] by phone today to assist with re-scheduling a follow up visit with the RN Case Manager  Follow up plan: Unsuccessful telephone outreach attempt made. A HIPAA compliant phone message was left for the patient providing contact information and requesting a return call.   [place]  Direct Dial: [phone]

## 2022-10-14 DIAGNOSIS — I130 Hypertensive heart and chronic kidney disease with heart failure and stage 1 through stage 4 chronic kidney disease, or unspecified chronic kidney disease: Secondary | ICD-10-CM | POA: Diagnosis not present

## 2022-10-14 DIAGNOSIS — M1711 Unilateral primary osteoarthritis, right knee: Secondary | ICD-10-CM | POA: Diagnosis not present

## 2022-10-14 DIAGNOSIS — I5032 Chronic diastolic (congestive) heart failure: Secondary | ICD-10-CM | POA: Diagnosis not present

## 2022-10-14 DIAGNOSIS — R188 Other ascites: Secondary | ICD-10-CM | POA: Diagnosis not present

## 2022-10-14 DIAGNOSIS — K7460 Unspecified cirrhosis of liver: Secondary | ICD-10-CM | POA: Diagnosis not present

## 2022-10-15 ENCOUNTER — Telehealth: Payer: Self-pay

## 2022-10-15 DIAGNOSIS — M1711 Unilateral primary osteoarthritis, right knee: Secondary | ICD-10-CM | POA: Diagnosis not present

## 2022-10-15 DIAGNOSIS — I5032 Chronic diastolic (congestive) heart failure: Secondary | ICD-10-CM | POA: Diagnosis not present

## 2022-10-15 DIAGNOSIS — R188 Other ascites: Secondary | ICD-10-CM | POA: Diagnosis not present

## 2022-10-15 DIAGNOSIS — K7460 Unspecified cirrhosis of liver: Secondary | ICD-10-CM | POA: Diagnosis not present

## 2022-10-15 DIAGNOSIS — I130 Hypertensive heart and chronic kidney disease with heart failure and stage 1 through stage 4 chronic kidney disease, or unspecified chronic kidney disease: Secondary | ICD-10-CM | POA: Diagnosis not present

## 2022-10-15 NOTE — Telephone Encounter (Signed)
This nurse called patient for AWV. His sister [name] stated that she was not with him at this time and asked to reschedule. We rescheduled for 10/21/2022 at 12:30.

## 2022-10-19 ENCOUNTER — Ambulatory Visit (INDEPENDENT_AMBULATORY_CARE_PROVIDER_SITE_OTHER): Payer: 59

## 2022-10-19 DIAGNOSIS — R188 Other ascites: Secondary | ICD-10-CM | POA: Diagnosis not present

## 2022-10-19 DIAGNOSIS — I8500 Esophageal varices without bleeding: Secondary | ICD-10-CM | POA: Diagnosis not present

## 2022-10-19 DIAGNOSIS — I639 Cerebral infarction, unspecified: Secondary | ICD-10-CM

## 2022-10-19 LAB — CUP PACEART REMOTE DEVICE CHECK
Date Time Interrogation Session: 20240310000508
Implantable Pulse Generator Implant Date: 20201014
Zone Setting Status: 755011
Zone Setting Status: 755011
Zone Setting Status: 755011

## 2022-10-19 NOTE — Progress Notes (Signed)
  Care Coordination Note  10/19/2022 Name: Trevor Murphy MRN: [id number] DOB: 1935/07/05  Trevor Murphy is a 88 y.o. year old male who is a primary care patient of [name], [place] and is actively engaged with the care management team. I reached out to [name] by phone today to assist with re-scheduling a follow up visit with the RN Case Manager  Follow up plan: Telephone appointment with care management team member scheduled for:10/30/22  [place]: [phone]

## 2022-10-21 ENCOUNTER — Ambulatory Visit (INDEPENDENT_AMBULATORY_CARE_PROVIDER_SITE_OTHER): Payer: 59

## 2022-10-21 VITALS — Ht 67.5 in | Wt 126.0 lb

## 2022-10-21 DIAGNOSIS — Z0000 Encounter for general adult medical examination without abnormal findings: Secondary | ICD-10-CM

## 2022-10-21 NOTE — Progress Notes (Signed)
I connected with  Trevor Murphy on 10/21/22 by a audio enabled telemedicine application and verified that I am speaking with the correct person using two identifiers. Sister Particia Nearing was also on the call  Patient Location: Home  Provider Location: Home Office  I discussed the limitations of evaluation and management by telemedicine. The patient expressed understanding and agreed to proceed.  Subjective:   Trevor Murphy is a 88 y.o. male who presents for Medicare Annual/Subsequent preventive examination.  Review of Systems     Cardiac Risk Factors include: advanced age (>35mn, >>48women);hypertension;male gender     Objective:    Today's Vitals   10/21/22 1232  Weight: 126 lb (57.2 kg)  Height: 5' 7.5" (1.715 m)   Body mass index is 19.44 kg/m.     10/21/2022   12:40 PM 09/26/2022    4:35 PM 09/24/2022   12:45 AM 07/14/2022    6:51 PM 06/06/2022    4:00 AM 09/17/2021   11:44 AM 03/03/2021   10:00 AM  Advanced Directives  Does Patient Have a Medical Advance Directive? Yes Yes No;Yes No Yes No No  Type of AParamedicof [place] will  Healthcare Power of [place]   Does patient want to make changes to medical advance directive?   No - Patient declined  No - Patient declined    Copy of [place] Chart? Yes - validated most recent copy scanned in chart (See row information) No - copy requested No - copy requested  No - copy requested    Would patient like information on creating a medical advance directive?    No - Patient declined   Yes (MAU/Ambulatory/Procedural Areas - Information given)    Current Medications (verified) Outpatient Encounter Medications as of 10/21/2022  Medication Sig   acetaminophen (TYLENOL) 500 MG tablet Take 1,000 mg by mouth 2 (two) times daily as needed for mild pain or moderate pain (leg pain).   dapagliflozin propanediol ([name]) 10 MG TABS tablet Take 1 tablet (10 mg  total) by mouth daily before breakfast.   ferrous sulfate (FEROSUL) 325 (65 FE) MG tablet TAKE 1 TABLET(325 MG) BY MOUTH DAILY (Patient taking differently: Take 325 mg by mouth daily.)   finasteride (PROSCAR) 5 MG tablet Take 5 mg by mouth daily.   LUMIGAN 0.01 % SOLN Place 1 drop into both eyes at bedtime.   SIMBRINZA 1-0.2 % SUSP Follow up with your PCP on when to restart this medication. (Patient taking differently: Place 1 drop into both eyes 3 (three) times daily.)   spironolactone (ALDACTONE) 50 MG tablet Take 1 tablet (50 mg total) by mouth daily.   No facility-administered encounter medications on file as of 10/21/2022.    Allergies (verified) Patient has no known allergies.   History: Past Medical History:  Diagnosis Date   Arthritis    CAD (coronary artery disease)    a. Cath 08/2009: minimal nonobstructive disease (25% LAD). b. Lexiscan nuc 6/215: EF 54%, no ischemia or infarction.   Chronic diastolic CHF (congestive heart failure) [place]    a. Echo 2013: EF 55-60%, mild AI, mild MR. b. Echo 5/15 with EF 60-65%, no WMA, mild LVH, mild AI.   CKD (chronic kidney disease) stage 3, GFR 30-59 ml/min (HCC) 07/26/2012   a. Felt likely due to HTN.   CVA (cerebral infarction) 07/28/2012   Glaucoma    Gout    H/O umbilical hernia repair  Headache(784.0)    " WHEN MY BLOOD PRESSURE IS UP"   History of acute renal failure    History of alcohol abuse    HTN (hypertension)    Hypertension    a. Patent renal arteries 2005.   Noncompliance    OA (osteoarthritis)    Other and unspecified hyperlipidemia    Poor circulation    Sinus bradycardia    SVT (supraventricular tachycardia)    a. Possible AVNRT.  Got adenosine from EMS in 2015 for SVT.  During 5/15 hospitalization had episode of short R-P tachycardia (possible AVNRT) terminated by adenosine.  b. 04/2016 - recurrent tachycardia (SVT vs. AVNRT), again terminated by Adenosine   Visual impairment    Past Surgical History:   Procedure Laterality Date   APPENDECTOMY     BIOPSY  08/12/2020   Procedure: BIOPSY;  Surgeon: Juanita Craver, MD;  Location[facility] ENDOSCOPY;  Service: Endoscopy;;   BUBBLE STUDY  05/24/2019   Procedure: BUBBLE STUDY;  Surgeon: Donato Heinz, MD;  Location: [facility] ENDOSCOPY;  Service: Endoscopy;;   CARDIAC CATHETERIZATION  11/2003   EF 50-55%   CARDIAC CATHETERIZATION  08/2009   CATARACT EXTRACTION EXTRACAPSULAR Right 05/17/2013   Procedure: CATARACT EXTRACTION EXTRACAPSULAR WITH INTRAOCULAR LENS PLACEMENT ([place]) RIGHT EYE;  Surgeon: Marylynn Pearson, MD;  Location: [facility];  Service: Ophthalmology;  Laterality: Right;   COLONOSCOPY     ESOPHAGOGASTRODUODENOSCOPY (EGD) WITH PROPOFOL N/A 08/12/2020   Procedure: ESOPHAGOGASTRODUODENOSCOPY (EGD) WITH PROPOFOL;  Surgeon: Juanita Craver, MD;  Location[facility] ENDOSCOPY;  Service: Endoscopy;  Laterality: N/A;   HERNIA REPAIR     IR PARACENTESIS  09/24/2022   LOOP RECORDER INSERTION N/A 05/24/2019   Procedure: LOOP RECORDER INSERTION;  Surgeon: Constance Haw, MD;  Location: [facility];  Service: Cardiovascular;  Laterality: N/A;   TEE WITHOUT CARDIOVERSION N/A 05/24/2019   Procedure: TRANSESOPHAGEAL ECHOCARDIOGRAM (TEE);  Surgeon: Donato Heinz, MD;  Location: [facility] ENDOSCOPY;  Service: Endoscopy;  Laterality: N/A;   VENTRAL HERNIA REPAIR  03/07/2012   Procedure: LAPAROSCOPIC VENTRAL HERNIA;  Surgeon: Gwenyth Ober, MD;  Location: [facility];  Service: General;  Laterality: N/A;   Family History  Problem Relation Age of Onset   CVA Mother    Hypertension Brother    Hypertension Sister    Hypertension Brother    Stroke Sister    Heart attack Neg Hx    Social History   Socioeconomic History   Marital status: Married    Spouse name: Not on file   Number of children: Not on file   Years of education: Not on file   Highest education level: Not on file  Occupational History   Occupation: retired  Tobacco Use   Smoking status: Never    Smokeless tobacco: Never  Vaping Use   Vaping Use: Never used  Substance and Sexual Activity   Alcohol use: No    Alcohol/week: 1.0 standard drink of alcohol    Types: 1 Cans of beer per week    Comment: h/o heavy use, quit in 2017   Drug use: No   Sexual activity: Not Currently  Other Topics Concern   Not on file  Social History Narrative   Not on file   Social Determinants of Health   Financial Resource Strain: Low Risk  (10/21/2022)   Overall Financial Resource Strain (CARDIA)    Difficulty of Paying Living Expenses: Not hard at all  Food Insecurity: No Food Insecurity (10/21/2022)   Hunger Vital Sign  Worried About Charity fundraiser in the Last Year: Never true    [place] in the Last Year: Never true  Transportation Needs: No Transportation Needs (10/21/2022)   PRAPARE - Hydrologist (Medical): No    Lack of Transportation (Non-Medical): No  Physical Activity: Inactive (10/21/2022)   Exercise Vital Sign    Days of Exercise per Week: 0 days    Minutes of Exercise per Session: 0 min  Stress: No Stress Concern Present (10/21/2022)   [place]    Feeling of Stress : Not at all  Social Connections: Moderately Isolated (04/15/2021)   Social Connection and Isolation Panel [NHANES]    Frequency of Communication with Friends and Family: More than three times a week    Frequency of Social Gatherings with Friends and Family: More than three times a week    Attends Religious Services: More than 4 times per year    Active Member of [employer] or Organizations: No    Attends Archivist Meetings: Never    Marital Status: Widowed    Tobacco Counseling Counseling given: Not Answered   Clinical Intake:  Pre-visit preparation completed: Yes  Pain : No/denies pain     Nutritional Status: BMI of 19-24  Normal Nutritional Risks: None Diabetes: No  How often do you need  to have someone help you when you read instructions, pamphlets, or other written materials from your doctor or pharmacy?: 1 - Never  Diabetic? no  Interpreter Needed?: No  Information entered by :: NAllen LPN   Activities of Daily Living    10/21/2022   12:41 PM 09/24/2022   12:45 AM  In your present state of health, do you have any difficulty performing the following activities:  Hearing? 1 0  Vision? 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 0 0  Walking or climbing stairs? 1 1  Dressing or bathing? 0 0  Doing errands, shopping? 1 1  Preparing Food and eating ? N   Using the Toilet? N   In the past six months, have you accidently leaked urine? Y   Do you have problems with loss of bowel control? N   Managing your Medications? Y   Comment family sets up   Managing your Finances? Y   Comment son Ecologist or managing your Housekeeping? Y   Comment sister manages     Patient Care Team: [name], FNP as PCP - General (General Practice) Early Osmond, MD as PCP - Cardiology (Cardiology) Rex Kras, [name], RN as Mark as [place] any recent [place] you may have received from other than [facility] providers in the past year (date may be approximate).     Assessment:   This is a routine wellness examination for Mizraim.  Hearing/Vision screen Vision Screening - Comments:: Regular eye exams, Dr. Venetia Maxon  Dietary issues and exercise activities discussed: Current Exercise Habits: The patient does not participate in regular exercise at present   Goals Addressed             This Visit's Progress    Patient Stated       10/21/2022, no goals       Depression Screen    10/21/2022   12:41 PM 08/06/2022    9:46 AM 06/18/2022  2:44 PM 04/20/2022   10:14 AM 09/17/2021   11:45 AM 09/12/2020   11:39 AM 11/07/2019   11:54 AM   PHQ 2/9 Scores  PHQ - 2 Score 0 0 0 0 0 0 0    Fall Risk    10/21/2022   12:40 PM 08/06/2022    9:45 AM 06/18/2022    2:44 PM 04/20/2022   10:14 AM 09/17/2021   11:44 AM  Fall Risk   Falls in the past year? 1 1 0 0 0  Comment legs give out      Number falls in past yr: 1 0 0 0   Injury with Fall? 0 1 0 0   Risk for fall due to : History of fall(s);Impaired balance/gait;Impaired mobility;Medication side effect History of fall(s) Impaired balance/gait;Impaired mobility No Fall Risks Impaired balance/gait;Impaired mobility;Medication side effect;Impaired vision  Follow up Falls prevention discussed;Education provided;Falls evaluation completed Falls evaluation completed Falls evaluation completed Falls evaluation completed Falls evaluation completed;Education provided;Falls prevention discussed    FALL RISK PREVENTION PERTAINING TO THE HOME:  Any stairs in or around the home? No  If so, are there any without handrails? N/a Home free of loose throw rugs in walkways, pet beds, electrical cords, etc? Yes  Adequate lighting in your home to reduce risk of falls? Yes   ASSISTIVE DEVICES UTILIZED TO PREVENT FALLS:  Life alert? No  Use of a cane, walker or w/c? Yes  Grab bars in the bathroom? Yes  Shower chair or bench in shower? Yes  Elevated toilet seat or a handicapped toilet? Yes   TIMED UP AND GO:  Was the test performed? No .      Cognitive Function:        10/21/2022   12:43 PM 09/17/2021   11:47 AM 09/12/2020   11:41 AM 08/08/2019   10:43 AM 07/22/2018   10:57 AM  6CIT Screen  What Year? 0 points 0 points 4 points 0 points 4 points  What month? 0 points 0 points 0 points 0 points 0 points  What time? 0 points 0 points 3 points 3 points 0 points  Count back from 20 0 points 0 points 0 points 0 points 0 points  Months in reverse 4 points 4 points 4 points 4 points 2 points  Repeat phrase 10 points 10 points 10 points 10 points 0 points  Total Score 14 points 14 points 21  points 17 points 6 points    Immunizations Immunization History  Administered Date(s) Administered   PFIZER Comirnaty(Gray Top)Covid-19 Tri-Sucrose Vaccine 09/10/2020   PFIZER(Purple Top)SARS-COV-2 Vaccination 08/20/2020   Tdap 06/21/2013    TDAP status: Up to date  Flu Vaccine status: Declined, Education has been provided regarding the importance of this vaccine but patient still declined. Advised may receive this vaccine at local pharmacy or Health Dept. Aware to provide a copy of the vaccination record if obtained from local pharmacy or Health Dept. Verbalized acceptance and understanding.  Pneumococcal vaccine status: Declined,  Education has been provided regarding the importance of this vaccine but patient still declined. Advised may receive this vaccine at local pharmacy or Health Dept. Aware to provide a copy of the vaccination record if obtained from local pharmacy or Health Dept. Verbalized acceptance and understanding.   Covid-19 vaccine status: Completed vaccines  Qualifies for Shingles Vaccine? Yes   Zostavax completed No   Shingrix Completed?: No.    Education has been provided regarding the importance of this vaccine. Patient has been advised to  call insurance company to determine out of pocket expense if they have not yet received this vaccine. Advised may also receive vaccine at local pharmacy or Health Dept. Verbalized acceptance and understanding.  Screening Tests Health Maintenance  Topic Date Due   Pneumonia Vaccine 65+ Years old (1 of 2 - PCV) Never done   Zoster Vaccines- Shingrix (1 of 2) Never done   COVID-19 Vaccine (3 - Pfizer risk series) 10/08/2020   Medicare Annual Wellness (AWV)  09/17/2022   INFLUENZA VACCINE  11/08/2022 (Originally 03/10/2022)   DTaP/Tdap/Td (2 - Td or Tdap) 06/22/2023   HPV VACCINES  Aged Out    Health Maintenance  Health Maintenance Due  Topic Date Due   Pneumonia Vaccine 52+ Years old (1 of 2 - PCV) Never done   Zoster  Vaccines- Shingrix (1 of 2) Never done   COVID-19 Vaccine (3 - Pfizer risk series) 10/08/2020   Medicare Annual Wellness (AWV)  09/17/2022    Colorectal cancer screening: No longer required.   Lung Cancer Screening: (Low Dose CT Chest recommended if Age 86-80 years, 30 pack-year currently smoking OR have quit w/in 15years.) does not qualify.   Lung Cancer Screening Referral: no  Additional Screening:  Hepatitis C Screening: does not qualify;  Vision Screening: Recommended annual ophthalmology exams for early detection of glaucoma and other disorders of the eye. Is the patient up to date with their annual eye exam?  Yes  Who is the provider or what is the name of the office in which the patient attends annual eye exams? Dr. Venetia Maxon If pt is not established with a provider, would they like to be referred to a provider to establish care? No .   Dental Screening: Recommended annual dental exams for proper oral hygiene  Community Resource Referral / Chronic Care Management: CRR required this visit?  No   CCM required this visit?  No      Plan:     I have personally reviewed and noted the following in the patient's chart:   Medical and social history Use of alcohol, tobacco or illicit drugs  Current medications and supplements including opioid prescriptions. Patient is not currently taking opioid prescriptions. Functional ability and status Nutritional status Physical activity Advanced directives List of other physicians Hospitalizations, surgeries, and ER visits in previous 12 months Vitals Screenings to include cognitive, depression, and falls Referrals and appointments  In addition, I have reviewed and discussed with patient certain preventive protocols, quality metrics, and best practice recommendations. A written personalized care plan for preventive services as well as general preventive health recommendations were provided to patient.     Kellie Simmering,  LPN   [id number]   Nurse Notes: none  Due to this being a virtual visit, the after visit summary with patients personalized plan was offered to patient via mail or my-chart.  to pick up at office at next visit

## 2022-10-21 NOTE — Patient Instructions (Signed)
Trevor Murphy , Thank you for taking time to come for your Medicare Wellness Visit. I appreciate your ongoing commitment to your health goals. Please review the following plan we discussed and let me know if I can assist you in the future.   These are the goals we discussed:  Goals       Care Coordination Activities      Care Coordination Interventions: Joint call placed to [facility] to order a personal emergency response system on behalf of the patient; due to the fact that patient was not present with sister we were unable to complete order Attempted to reschedule call; Trevor Murphy declined stating when she is with patient she will help him order a device and will contact SW if she is met with barriers Provided Trevor Murphy with the number to [employer] who contracts with [place] advising to have patients health plan ID when she places the call Discussed plan for SW to follow up over the next two weeks to confirm there were no barriers with placing the order for a personal emergency response system (PERS)       Patient Stated      08/08/2019, no goals       Patient Stated      09/12/2020, no goals      Patient Stated      09/17/2021, no goals      Patient Stated      10/21/2022, no goals      To increase water intake and start [place] Coordination Interventions: Assessed the [name] understanding of chronic kidney disease    Evaluation of current treatment plan related to chronic kidney disease self management and patient's adherence to plan as established by provider      Reviewed prescribed diet increase water intake to 48 oz daily  Provided education on kidney disease progression    Reviewed medications with [name] including new Rx for [name] Determined [name] was advised by the pharmacy, patient did not have Rx on file for this medication Confirmed per chart review, [name] 10 mg tablet was prescribed on 06/18/22 with 2 refills,  confirmed pharmacy with sister Educated sister on indication and usage of this medication including the importance of patient staying well hydrated         To reduce number of falls      Care Coordination Interventions: Assessed for falls since last encounter Assessed patients knowledge of fall risk prevention secondary to previously provided education Provided patient information for fall alert systems SW referral sent to assist with initiation of emergency alert system Reviewed scheduled/upcoming provider appointment including: next PCP follow up appointment scheduled for 09/24/22 '@10'$ :20 AM, AWV/11:00 AM w/PCP         To start daily weights (pt-stated)      Care Coordination Interventions: Provided education on low sodium diet Reviewed Heart Failure Action Plan in depth and provided written copy Discussed importance of daily weight and advised patient to weigh and record daily Determined the patient's weights are stable at this time  Reviewed medications with patient and discussed importance of medication adherence           This is a list of the screening recommended for you and due dates:  Health Maintenance  Topic Date Due   Pneumonia Vaccine (1 of 2 - PCV) Never done   Zoster (Shingles) Vaccine (1 of 2) Never done   COVID-19 Vaccine (3 - [employer]  risk series) 10/08/2020   Flu Shot  11/08/2022*   DTaP/Tdap/Td vaccine (2 - Td or Tdap) 06/22/2023   Medicare Annual Wellness Visit  10/21/2023   HPV Vaccine  Aged Out  *Topic was postponed. The date shown is not the original due date.    Advanced directives: copy in chart  Conditions/risks identified: none  Next appointment: Follow up in one year for your annual wellness visit.   Preventive Care 15 Years and Older, Male  Preventive care refers to lifestyle choices and visits with your health care provider that can promote health and wellness. What does preventive care include? A yearly physical exam. This is also  called an annual well check. Dental exams once or twice a year. Routine eye exams. Ask your health care provider how often you should have your eyes checked. Personal lifestyle choices, including: Daily care of your teeth and gums. Regular physical activity. Eating a healthy diet. Avoiding tobacco and drug use. Limiting alcohol use. Practicing safe sex. Taking low doses of aspirin every day. Taking vitamin and mineral supplements as recommended by your health care provider. What happens during an annual well check? The services and screenings done by your health care provider during your annual well check will depend on your age, overall health, lifestyle risk factors, and family history of disease. Counseling  Your health care provider may ask you questions about your: Alcohol use. Tobacco use. Drug use. Emotional well-being. Home and relationship well-being. Sexual activity. Eating habits. History of falls. Memory and ability to understand (cognition). Work and work Statistician. Screening  You may have the following tests or measurements: Height, weight, and BMI. Blood pressure. Lipid and cholesterol levels. These may be checked every 5 years, or more frequently if you are over 6 years old. Skin check. Lung cancer screening. You may have this screening every year starting at age 81 if you have a 30-pack-year history of smoking and currently smoke or have quit within the past 15 years. Fecal occult blood test (FOBT) of the stool. You may have this test every year starting at age 90. Flexible sigmoidoscopy or colonoscopy. You may have a sigmoidoscopy every 5 years or a colonoscopy every 10 years starting at age 62. Prostate cancer screening. Recommendations will vary depending on your family history and other risks. Hepatitis C blood test. Hepatitis B blood test. Sexually transmitted disease (STD) testing. Diabetes screening. This is done by checking your blood sugar (glucose)  after you have not eaten for a while (fasting). You may have this done every 1-3 years. Abdominal aortic aneurysm (AAA) screening. You may need this if you are a current or former smoker. Osteoporosis. You may be screened starting at age 23 if you are at high risk. Talk with your health care provider about your test results, treatment options, and if necessary, the need for more tests. Vaccines  Your health care provider may recommend certain vaccines, such as: Influenza vaccine. This is recommended every year. Tetanus, diphtheria, and acellular pertussis (Tdap, Td) vaccine. You may need a Td booster every 10 years. Zoster vaccine. You may need this after age 25. Pneumococcal 13-valent conjugate (PCV13) vaccine. One dose is recommended after age 17. Pneumococcal polysaccharide (PPSV23) vaccine. One dose is recommended after age 79. Talk to your health care provider about which screenings and vaccines you need and how often you need them. This information is not intended to replace advice given to you by your health care provider. Make sure you discuss any questions you have  with your health care provider. Document Released: 08/23/2015 Document Revised: 04/15/2016 Document Reviewed: 05/28/2015 Elsevier Interactive Patient Education  2017 [employer] Prevention in the Home Falls can cause injuries. They can happen to people of all ages. There are many things you can do to make your home safe and to help prevent falls. What can I do on the outside of my home? Regularly fix the edges of walkways and driveways and fix any cracks. Remove anything that might make you trip as you walk through a door, such as a raised step or threshold. Trim any bushes or trees on the path to your home. Use bright outdoor lighting. Clear any walking paths of anything that might make someone trip, such as rocks or tools. Regularly check to see if handrails are loose or broken. Make sure that both sides of any  steps have handrails. Any raised decks and porches should have guardrails on the edges. Have any leaves, snow, or ice cleared regularly. Use sand or salt on walking paths during winter. Clean up any spills in your garage right away. This includes oil or grease spills. What can I do in the bathroom? Use night lights. Install grab bars by the toilet and in the tub and shower. Do not use towel bars as grab bars. Use non-skid mats or decals in the tub or shower. If you need to sit down in the shower, use a plastic, non-slip stool. Keep the floor dry. Clean up any water that spills on the floor as soon as it happens. Remove soap buildup in the tub or shower regularly. Attach bath mats securely with double-sided non-slip rug tape. Do not have throw rugs and other things on the floor that can make you trip. What can I do in the bedroom? Use night lights. Make sure that you have a light by your bed that is easy to reach. Do not use any sheets or blankets that are too big for your bed. They should not hang down onto the floor. Have a firm chair that has side arms. You can use this for support while you get dressed. Do not have throw rugs and other things on the floor that can make you trip. What can I do in the kitchen? Clean up any spills right away. Avoid walking on wet floors. Keep items that you use a lot in easy-to-reach places. If you need to reach something above you, use a strong step stool that has a grab bar. Keep electrical cords out of the way. Do not use floor polish or wax that makes floors slippery. If you must use wax, use non-skid floor wax. Do not have throw rugs and other things on the floor that can make you trip. What can I do with my stairs? Do not leave any items on the stairs. Make sure that there are handrails on both sides of the stairs and use them. Fix handrails that are broken or loose. Make sure that handrails are as long as the stairways. Check any carpeting to  make sure that it is firmly attached to the stairs. Fix any carpet that is loose or worn. Avoid having throw rugs at the top or bottom of the stairs. If you do have throw rugs, attach them to the floor with carpet tape. Make sure that you have a light switch at the top of the stairs and the bottom of the stairs. If you do not have them, ask someone to add them for you. What  else can I do to help prevent falls? Wear shoes that: Do not have high heels. Have rubber bottoms. Are comfortable and fit you well. Are closed at the toe. Do not wear sandals. If you use a stepladder: Make sure that it is fully opened. Do not climb a closed stepladder. Make sure that both sides of the stepladder are locked into place. Ask someone to hold it for you, if possible. Clearly mark and make sure that you can see: Any grab bars or handrails. First and last steps. Where the edge of each step is. Use tools that help you move around (mobility aids) if they are needed. These include: Canes. Walkers. Scooters. Crutches. Turn on the lights when you go into a dark area. Replace any light bulbs as soon as they burn out. Set up your furniture so you have a clear path. Avoid moving your furniture around. If any of your floors are uneven, fix them. If there are any pets around you, be aware of where they are. Review your medicines with your doctor. Some medicines can make you feel dizzy. This can increase your chance of falling. Ask your doctor what other things that you can do to help prevent falls. This information is not intended to replace advice given to you by your health care provider. Make sure you discuss any questions you have with your health care provider. Document Released: 05/23/2009 Document Revised: 01/02/2016 Document Reviewed: 08/31/2014 Elsevier Interactive Patient Education  2017 [employer].

## 2022-10-22 ENCOUNTER — Telehealth: Payer: Self-pay

## 2022-10-22 DIAGNOSIS — I130 Hypertensive heart and chronic kidney disease with heart failure and stage 1 through stage 4 chronic kidney disease, or unspecified chronic kidney disease: Secondary | ICD-10-CM | POA: Diagnosis not present

## 2022-10-22 DIAGNOSIS — K7460 Unspecified cirrhosis of liver: Secondary | ICD-10-CM | POA: Diagnosis not present

## 2022-10-22 DIAGNOSIS — M1711 Unilateral primary osteoarthritis, right knee: Secondary | ICD-10-CM | POA: Diagnosis not present

## 2022-10-22 DIAGNOSIS — R188 Other ascites: Secondary | ICD-10-CM | POA: Diagnosis not present

## 2022-10-22 DIAGNOSIS — I5032 Chronic diastolic (congestive) heart failure: Secondary | ICD-10-CM | POA: Diagnosis not present

## 2022-10-22 NOTE — Telephone Encounter (Addendum)
Alert received from [employer]:  ILR alert report received. Battery status OK. Normal device function. No new symptom, tachy, or pause episodes. No new AF episodes.  AF burden is 0% of the time.   There were 44 brady episodes that were from 30-40 bpm.  Longest event was 6 minutes and 25 seconds.   Sent to triage.     Reviewed report with Dr. Lovena Le.  Strips indicate SR competing with an accelerated junction rhythm.     No action needed.  Will change brady detection to <30 bpm per Dr. Curt Bears.

## 2022-10-23 ENCOUNTER — Telehealth: Payer: Self-pay

## 2022-10-23 NOTE — Telephone Encounter (Signed)
ILR alert for 18 new brady events during waking hours 3/14 EGM's show primarily a junctional rhythm, 19sec-38min 26sec in duration, HR's 34-38 Brady events routed 3/14, alert changed to <30, no symptom activation's Route to triage for awareness of ongoing brady events LA  ( SEE PHONE NOTE on 10/22/22  WITH DR. Curt Bears AND DR. Lovena Le RESPONSE). Will continue with monitoring.

## 2022-10-28 ENCOUNTER — Other Ambulatory Visit: Payer: Self-pay

## 2022-10-28 DIAGNOSIS — M1711 Unilateral primary osteoarthritis, right knee: Secondary | ICD-10-CM | POA: Diagnosis not present

## 2022-10-28 DIAGNOSIS — I5032 Chronic diastolic (congestive) heart failure: Secondary | ICD-10-CM

## 2022-10-28 DIAGNOSIS — I130 Hypertensive heart and chronic kidney disease with heart failure and stage 1 through stage 4 chronic kidney disease, or unspecified chronic kidney disease: Secondary | ICD-10-CM | POA: Diagnosis not present

## 2022-10-28 DIAGNOSIS — R188 Other ascites: Secondary | ICD-10-CM | POA: Diagnosis not present

## 2022-10-28 DIAGNOSIS — K7460 Unspecified cirrhosis of liver: Secondary | ICD-10-CM | POA: Diagnosis not present

## 2022-10-28 MED ORDER — DAPAGLIFLOZIN PROPANEDIOL 10 MG PO TABS: 10.0000 mg | ORAL_TABLET | Freq: Every day | ORAL | 2 refills | Status: DC

## 2022-10-29 ENCOUNTER — Telehealth: Payer: Self-pay

## 2022-10-29 DIAGNOSIS — I5032 Chronic diastolic (congestive) heart failure: Secondary | ICD-10-CM | POA: Diagnosis not present

## 2022-10-29 DIAGNOSIS — M1711 Unilateral primary osteoarthritis, right knee: Secondary | ICD-10-CM | POA: Diagnosis not present

## 2022-10-29 DIAGNOSIS — R188 Other ascites: Secondary | ICD-10-CM | POA: Diagnosis not present

## 2022-10-29 DIAGNOSIS — K7460 Unspecified cirrhosis of liver: Secondary | ICD-10-CM | POA: Diagnosis not present

## 2022-10-29 DIAGNOSIS — I130 Hypertensive heart and chronic kidney disease with heart failure and stage 1 through stage 4 chronic kidney disease, or unspecified chronic kidney disease: Secondary | ICD-10-CM | POA: Diagnosis not present

## 2022-10-29 NOTE — Progress Notes (Signed)
Carelink Summary Report / Loop Recorder 

## 2022-10-29 NOTE — Telephone Encounter (Signed)
Received fax from [facility] requesting refill on Atorvastatin. Per chart patient is not currently on this medication.   Last prescribed 09/23/22 but has been discontinued Order Status Reason By On  Discontinued None Dwyane Dee, MD 09/26/22 1135    Please advise, thank you!

## 2022-10-30 ENCOUNTER — Ambulatory Visit: Payer: Self-pay

## 2022-10-30 NOTE — Patient Outreach (Signed)
  Care Coordination   Follow Up Visit Note   10/30/2022 Name: Trevor Murphy MRN: [id number] DOB: 08-17-1934  Trevor Murphy is a 88 y.o. year old male who sees [name], [place] for primary care. I spoke with patient's [name] today by telephone for an RN CC update.   What matters to the patients health and wellness today?  Patient's sister would like for patient to stay healthy and out of the hospital.     Goals Addressed             This Visit's Progress    To reduce number of falls       Care Coordination Interventions: Placed successful outbound call to [name]  Assessed for falls since last encounter Determined patient experienced a hospital admission since last RN CC follow up Reviewed and discussed discharge instructions for GI follow up with Dr. Benson Norway Determined this visit was completed and per [name], Dr. Benson Norway recommended a one year follow up unless the abdominal fluid returns  Discussed [name] feels the patient has made a full recovery and currently is reporting he feels well without noted symptoms  Determined patient has a good support system in place and has caregiver assistance when needed daily provided by his family  Determined [name] is able to verbalize signs/symptoms suggestive of ascites and when to call the doctor if symptoms reoccur Instructed [name] to report new symptoms or concerns to patient's doctor promptly if symptoms reoccur      Interventions Today    Flowsheet Row Most Recent Value  Chronic Disease   Chronic disease during today's visit Other  [ascites]  General Interventions   General Interventions Discussed/Reviewed General Interventions Discussed, Labs, General Interventions Reviewed, Doctor Visits, Level of Care  Doctor Visits Discussed/Reviewed --  [post hospital]  Level of Care --  [family providing care]  Education Interventions   Education Provided Provided Education  Provided Verbal  Education On When to see the doctor  Nutrition Interventions   Nutrition Discussed/Reviewed Fluid intake  Pharmacy Interventions   Pharmacy Dicussed/Reviewed Medications and their functions  Safety Interventions   Safety Discussed/Reviewed Safety Discussed, Safety Reviewed, Fall Risk, Home Safety          SDOH assessments and interventions completed:  No     Care Coordination Interventions:  Yes, provided   Follow up plan: Follow up call scheduled for 11/27/22 @11 :00 AM    Encounter Outcome:  Pt. Visit Completed

## 2022-10-30 NOTE — Patient Instructions (Signed)
Visit Information  Thank you for taking time to visit with me today. Please don't hesitate to contact me if I can be of assistance to you.   Following are the goals we discussed today:   Goals Addressed             This Visit's Progress    To reduce number of falls       Care Coordination Interventions: Placed successful outbound call to sister [name]  Assessed for falls since last encounter Determined patient experienced a hospital admission since last RN CC follow up Reviewed and discussed discharge instructions for GI follow up with Dr. Benson Norway Determined this visit was completed and per sister [name], Dr. Benson Norway recommended a one year follow up unless the abdominal fluid returns  Discussed sister [name] feels the patient has made a full recovery and currently is reporting he feels well without noted symptoms  Determined patient has a good support system in place and has caregiver assistance when needed daily provided by his family  Determined sister [name] is able to verbalize signs/symptoms suggestive of ascites and when to call the doctor if symptoms reoccur Instructed sister [name] to report new symptoms or concerns to patient's doctor promptly if symptoms reoccur          Our next appointment is by telephone on 11/27/22 at 11:00 AM  Please call the care guide team at [phone] if you need to cancel or reschedule your appointment.   If you are experiencing a Mental Health or [place] or need someone to talk to, please call 1-800-273-TALK (toll free, 24 hour hotline) go to [facility] [place] [phone])  The patient verbalized understanding of instructions, educational materials, and care plan provided today and DECLINED offer to receive copy of patient instructions, educational materials, and care plan.   Barb Merino, RN, BSN, CCM Care Management Coordinator [facility] Care Management Direct Phone:  [phone]

## 2022-11-09 ENCOUNTER — Other Ambulatory Visit: Payer: Self-pay

## 2022-11-09 DIAGNOSIS — D509 Iron deficiency anemia, unspecified: Secondary | ICD-10-CM

## 2022-11-09 MED ORDER — FERROUS SULFATE 325 (65 FE) MG PO TABS: ORAL_TABLET | ORAL | 1 refills | Status: DC

## 2022-11-09 NOTE — Telephone Encounter (Signed)
Spoke with patient's sister, she is aware of the Atorvastatin being stopped. She states they did not request a refill, advised her if it is set up on automatic refills the pharmacy will not know it has been discontinued. She will need to contact the pharmacy to advise them. She asked for refill on iron supplement, this has been sent to the pharmacy. Nothing further needed at this time.

## 2022-11-18 ENCOUNTER — Other Ambulatory Visit: Payer: Self-pay

## 2022-11-18 DIAGNOSIS — I5032 Chronic diastolic (congestive) heart failure: Secondary | ICD-10-CM

## 2022-11-18 MED ORDER — DAPAGLIFLOZIN PROPANEDIOL 10 MG PO TABS: 10.0000 mg | ORAL_TABLET | Freq: Every day | ORAL | 2 refills | Status: DC

## 2022-11-20 ENCOUNTER — Inpatient Hospital Stay (HOSPITAL_COMMUNITY)
Admission: EM | Admit: 2022-11-20 | Discharge: 2022-11-27 | DRG: 329 | Disposition: A | Discharge status: Home Health | Payer: 59 | Attending: Student | Admitting: Student

## 2022-11-20 ENCOUNTER — Encounter (HOSPITAL_COMMUNITY): Payer: Self-pay

## 2022-11-20 ENCOUNTER — Inpatient Hospital Stay (HOSPITAL_COMMUNITY): Payer: 59 | Admitting: Anesthesiology

## 2022-11-20 ENCOUNTER — Emergency Department (HOSPITAL_COMMUNITY): Payer: 59

## 2022-11-20 ENCOUNTER — Other Ambulatory Visit: Payer: Self-pay

## 2022-11-20 ENCOUNTER — Encounter (HOSPITAL_COMMUNITY)
Admission: EM | Disposition: A | Discharge status: Home Health | Payer: Self-pay | Source: Home / Self Care | Attending: Family Medicine

## 2022-11-20 DIAGNOSIS — D61818 Other pancytopenia: Secondary | ICD-10-CM | POA: Diagnosis present

## 2022-11-20 DIAGNOSIS — H409 Unspecified glaucoma: Secondary | ICD-10-CM | POA: Diagnosis not present

## 2022-11-20 DIAGNOSIS — K55019 Acute (reversible) ischemia of small intestine, extent unspecified: Secondary | ICD-10-CM | POA: Diagnosis not present

## 2022-11-20 DIAGNOSIS — R109 Unspecified abdominal pain: Secondary | ICD-10-CM | POA: Diagnosis not present

## 2022-11-20 DIAGNOSIS — Z8673 Personal history of transient ischemic attack (TIA), and cerebral infarction without residual deficits: Secondary | ICD-10-CM

## 2022-11-20 DIAGNOSIS — Z66 Do not resuscitate: Secondary | ICD-10-CM | POA: Diagnosis not present

## 2022-11-20 DIAGNOSIS — E875 Hyperkalemia: Secondary | ICD-10-CM | POA: Diagnosis not present

## 2022-11-20 DIAGNOSIS — I130 Hypertensive heart and chronic kidney disease with heart failure and stage 1 through stage 4 chronic kidney disease, or unspecified chronic kidney disease: Secondary | ICD-10-CM | POA: Diagnosis present

## 2022-11-20 DIAGNOSIS — Z8249 Family history of ischemic heart disease and other diseases of the circulatory system: Secondary | ICD-10-CM

## 2022-11-20 DIAGNOSIS — M109 Gout, unspecified: Secondary | ICD-10-CM | POA: Diagnosis present

## 2022-11-20 DIAGNOSIS — N1832 Chronic kidney disease, stage 3b: Secondary | ICD-10-CM | POA: Diagnosis not present

## 2022-11-20 DIAGNOSIS — Z79899 Other long term (current) drug therapy: Secondary | ICD-10-CM

## 2022-11-20 DIAGNOSIS — I5032 Chronic diastolic (congestive) heart failure: Secondary | ICD-10-CM | POA: Diagnosis present

## 2022-11-20 DIAGNOSIS — I8510 Secondary esophageal varices without bleeding: Secondary | ICD-10-CM | POA: Diagnosis present

## 2022-11-20 DIAGNOSIS — I2510 Atherosclerotic heart disease of native coronary artery without angina pectoris: Secondary | ICD-10-CM

## 2022-11-20 DIAGNOSIS — K625 Hemorrhage of anus and rectum: Secondary | ICD-10-CM | POA: Diagnosis not present

## 2022-11-20 DIAGNOSIS — I509 Heart failure, unspecified: Secondary | ICD-10-CM

## 2022-11-20 DIAGNOSIS — R188 Other ascites: Secondary | ICD-10-CM | POA: Diagnosis not present

## 2022-11-20 DIAGNOSIS — N179 Acute kidney failure, unspecified: Secondary | ICD-10-CM | POA: Diagnosis not present

## 2022-11-20 DIAGNOSIS — Z95828 Presence of other vascular implants and grafts: Secondary | ICD-10-CM | POA: Diagnosis not present

## 2022-11-20 DIAGNOSIS — R001 Bradycardia, unspecified: Secondary | ICD-10-CM | POA: Diagnosis not present

## 2022-11-20 DIAGNOSIS — Z823 Family history of stroke: Secondary | ICD-10-CM

## 2022-11-20 DIAGNOSIS — D696 Thrombocytopenia, unspecified: Secondary | ICD-10-CM | POA: Diagnosis not present

## 2022-11-20 DIAGNOSIS — I5030 Unspecified diastolic (congestive) heart failure: Secondary | ICD-10-CM | POA: Diagnosis not present

## 2022-11-20 DIAGNOSIS — E785 Hyperlipidemia, unspecified: Secondary | ICD-10-CM | POA: Diagnosis present

## 2022-11-20 DIAGNOSIS — K4040 Unilateral inguinal hernia, with gangrene, not specified as recurrent: Secondary | ICD-10-CM

## 2022-11-20 DIAGNOSIS — I480 Paroxysmal atrial fibrillation: Secondary | ICD-10-CM | POA: Diagnosis present

## 2022-11-20 DIAGNOSIS — K4090 Unilateral inguinal hernia, without obstruction or gangrene, not specified as recurrent: Secondary | ICD-10-CM | POA: Diagnosis not present

## 2022-11-20 DIAGNOSIS — K4030 Unilateral inguinal hernia, with obstruction, without gangrene, not specified as recurrent: Secondary | ICD-10-CM

## 2022-11-20 DIAGNOSIS — K460 Unspecified abdominal hernia with obstruction, without gangrene: Secondary | ICD-10-CM | POA: Diagnosis present

## 2022-11-20 DIAGNOSIS — D62 Acute posthemorrhagic anemia: Secondary | ICD-10-CM | POA: Diagnosis not present

## 2022-11-20 DIAGNOSIS — N329 Bladder disorder, unspecified: Secondary | ICD-10-CM | POA: Diagnosis not present

## 2022-11-20 DIAGNOSIS — N200 Calculus of kidney: Secondary | ICD-10-CM | POA: Diagnosis not present

## 2022-11-20 DIAGNOSIS — I110 Hypertensive heart disease with heart failure: Secondary | ICD-10-CM

## 2022-11-20 DIAGNOSIS — D500 Iron deficiency anemia secondary to blood loss (chronic): Secondary | ICD-10-CM | POA: Diagnosis not present

## 2022-11-20 DIAGNOSIS — I4891 Unspecified atrial fibrillation: Secondary | ICD-10-CM | POA: Diagnosis not present

## 2022-11-20 DIAGNOSIS — N183 Chronic kidney disease, stage 3 unspecified: Secondary | ICD-10-CM | POA: Diagnosis not present

## 2022-11-20 DIAGNOSIS — E1122 Type 2 diabetes mellitus with diabetic chronic kidney disease: Secondary | ICD-10-CM | POA: Diagnosis present

## 2022-11-20 DIAGNOSIS — K869 Disease of pancreas, unspecified: Secondary | ICD-10-CM | POA: Diagnosis present

## 2022-11-20 DIAGNOSIS — K921 Melena: Secondary | ICD-10-CM | POA: Diagnosis not present

## 2022-11-20 DIAGNOSIS — K658 Other peritonitis: Secondary | ICD-10-CM | POA: Diagnosis not present

## 2022-11-20 DIAGNOSIS — K7460 Unspecified cirrhosis of liver: Secondary | ICD-10-CM | POA: Diagnosis not present

## 2022-11-20 DIAGNOSIS — K5791 Diverticulosis of intestine, part unspecified, without perforation or abscess with bleeding: Secondary | ICD-10-CM | POA: Diagnosis not present

## 2022-11-20 DIAGNOSIS — I340 Nonrheumatic mitral (valve) insufficiency: Secondary | ICD-10-CM

## 2022-11-20 DIAGNOSIS — I10 Essential (primary) hypertension: Secondary | ICD-10-CM | POA: Diagnosis not present

## 2022-11-20 DIAGNOSIS — R000 Tachycardia, unspecified: Secondary | ICD-10-CM | POA: Diagnosis not present

## 2022-11-20 DIAGNOSIS — K8020 Calculus of gallbladder without cholecystitis without obstruction: Secondary | ICD-10-CM | POA: Diagnosis not present

## 2022-11-20 DIAGNOSIS — K661 Hemoperitoneum: Secondary | ICD-10-CM | POA: Diagnosis not present

## 2022-11-20 DIAGNOSIS — I639 Cerebral infarction, unspecified: Secondary | ICD-10-CM | POA: Diagnosis not present

## 2022-11-20 HISTORY — PX: INGUINAL HERNIA REPAIR: SHX194

## 2022-11-20 LAB — COMPREHENSIVE METABOLIC PANEL
ALT: 21 U/L (ref 0–44)
AST: 29 U/L (ref 15–41)
Albumin: 2.8 g/dL — ABNORMAL LOW (ref 3.5–5.0)
Alkaline Phosphatase: 112 U/L (ref 38–126)
Anion gap: 7 (ref 5–15)
BUN: 26 mg/dL — ABNORMAL HIGH (ref 8–23)
CO2: 17 mmol/L — ABNORMAL LOW (ref 22–32)
Calcium: 8.2 mg/dL — ABNORMAL LOW (ref 8.9–10.3)
Chloride: 113 mmol/L — ABNORMAL HIGH (ref 98–111)
Creatinine, Ser: 1.6 mg/dL — ABNORMAL HIGH (ref 0.61–1.24)
GFR, Estimated: 42 mL/min — ABNORMAL LOW (ref >60)
Glucose, Bld: 189 mg/dL — ABNORMAL HIGH (ref 70–99)
Potassium: 5.1 mmol/L (ref 3.5–5.1)
Sodium: 137 mmol/L (ref 135–145)
Total Bilirubin: 1.1 mg/dL (ref 0.3–1.2)
Total Protein: 6.9 g/dL (ref 6.5–8.1)

## 2022-11-20 LAB — TYPE AND SCREEN
ABO/RH(D): O NEG
Antibody Screen: NEGATIVE

## 2022-11-20 LAB — CBC
HCT: 33.8 % — ABNORMAL LOW (ref 39.0–52.0)
Hemoglobin: 10.6 g/dL — ABNORMAL LOW (ref 13.0–17.0)
MCH: 29.7 pg (ref 26.0–34.0)
MCHC: 31.4 g/dL (ref 30.0–36.0)
MCV: 94.7 fL (ref 80.0–100.0)
Platelets: 109 10*3/uL — ABNORMAL LOW (ref 150–400)
RBC: 3.57 MIL/uL — ABNORMAL LOW (ref 4.22–5.81)
RDW: 15.4 % (ref 11.5–15.5)
WBC: 7 10*3/uL (ref 4.0–10.5)
nRBC: 0 % (ref 0.0–0.2)

## 2022-11-20 LAB — URINALYSIS, ROUTINE W REFLEX MICROSCOPIC
Bilirubin Urine: NEGATIVE
Glucose, UA: NEGATIVE mg/dL
Ketones, ur: NEGATIVE mg/dL
Leukocytes,Ua: NEGATIVE
Nitrite: NEGATIVE
Protein, ur: 100 mg/dL — AB
Specific Gravity, Urine: 1.019 (ref 1.005–1.030)
pH: 5 (ref 5.0–8.0)

## 2022-11-20 LAB — LACTIC ACID, PLASMA: Lactic Acid, Venous: 3.3 mmol/L (ref 0.5–1.9)

## 2022-11-20 LAB — LIPASE, BLOOD: Lipase: 40 U/L (ref 11–51)

## 2022-11-20 LAB — PROTIME-INR
INR: 1.4 — ABNORMAL HIGH (ref 0.8–1.2)
Prothrombin Time: 16.9 seconds — ABNORMAL HIGH (ref 11.4–15.2)

## 2022-11-20 SURGERY — REPAIR, HERNIA, INGUINAL, ADULT
Anesthesia: General | Laterality: Left

## 2022-11-20 MED ORDER — ACETAMINOPHEN 325 MG PO TABS
650.0000 mg | ORAL_TABLET | Freq: Four times a day (QID) | ORAL | Status: DC | PRN
  Administered 2022-11-22 – 2022-11-27 (×6): 650 mg via ORAL
  Filled 2022-11-20 (×7): qty 2

## 2022-11-20 MED ORDER — PROMETHAZINE HCL 25 MG/ML IJ SOLN: 6.2500 mg | INTRAMUSCULAR | Status: DC | PRN

## 2022-11-20 MED ORDER — DIAZEPAM 5 MG/ML IJ SOLN
2.5000 mg | Freq: Once | INTRAMUSCULAR | Status: AC
  Administered 2022-11-20: 2.5 mg via INTRAVENOUS
  Filled 2022-11-20: qty 2

## 2022-11-20 MED ORDER — MORPHINE SULFATE (PF) 4 MG/ML IV SOLN
4.0000 mg | Freq: Once | INTRAVENOUS | Status: AC
  Administered 2022-11-20: 4 mg via INTRAVENOUS
  Filled 2022-11-20: qty 1

## 2022-11-20 MED ORDER — LIDOCAINE 2% (20 MG/ML) 5 ML SYRINGE
INTRAMUSCULAR | Status: DC | PRN
  Administered 2022-11-20: 20 mg via INTRAVENOUS

## 2022-11-20 MED ORDER — FENTANYL CITRATE (PF) 100 MCG/2ML IJ SOLN: 25.0000 ug | INTRAMUSCULAR | Status: DC | PRN

## 2022-11-20 MED ORDER — ONDANSETRON HCL 4 MG/2ML IJ SOLN
4.0000 mg | Freq: Once | INTRAMUSCULAR | Status: AC
  Administered 2022-11-20: 4 mg via INTRAVENOUS
  Filled 2022-11-20: qty 2

## 2022-11-20 MED ORDER — LIDOCAINE 2% (20 MG/ML) 5 ML SYRINGE
INTRAMUSCULAR | Status: AC
  Filled 2022-11-20: qty 20

## 2022-11-20 MED ORDER — ONDANSETRON HCL 4 MG PO TABS: 4.0000 mg | ORAL_TABLET | Freq: Four times a day (QID) | ORAL | Status: DC | PRN

## 2022-11-20 MED ORDER — SUCCINYLCHOLINE CHLORIDE 200 MG/10ML IV SOSY
PREFILLED_SYRINGE | INTRAVENOUS | Status: AC
  Filled 2022-11-20: qty 10

## 2022-11-20 MED ORDER — OXYCODONE HCL 5 MG PO TABS: 5.0000 mg | ORAL_TABLET | Freq: Once | ORAL | Status: DC | PRN

## 2022-11-20 MED ORDER — ROCURONIUM BROMIDE 10 MG/ML (PF) SYRINGE
PREFILLED_SYRINGE | INTRAVENOUS | Status: AC
  Filled 2022-11-20: qty 30

## 2022-11-20 MED ORDER — PROPOFOL 10 MG/ML IV BOLUS
INTRAVENOUS | Status: DC | PRN
  Administered 2022-11-20: 50 mg via INTRAVENOUS

## 2022-11-20 MED ORDER — ROCURONIUM BROMIDE 10 MG/ML (PF) SYRINGE
PREFILLED_SYRINGE | INTRAVENOUS | Status: DC | PRN
  Administered 2022-11-20: 50 mg via INTRAVENOUS

## 2022-11-20 MED ORDER — SODIUM CHLORIDE 0.9% FLUSH
3.0000 mL | Freq: Two times a day (BID) | INTRAVENOUS | Status: DC
  Administered 2022-11-21 – 2022-11-26 (×10): 3 mL via INTRAVENOUS

## 2022-11-20 MED ORDER — PHENYLEPHRINE HCL-NACL 20-0.9 MG/250ML-% IV SOLN
INTRAVENOUS | Status: DC | PRN
  Administered 2022-11-20: 25 ug/min via INTRAVENOUS

## 2022-11-20 MED ORDER — MIDAZOLAM HCL 2 MG/2ML IJ SOLN: 0.5000 mg | Freq: Once | INTRAMUSCULAR | Status: DC | PRN

## 2022-11-20 MED ORDER — BUPIVACAINE LIPOSOME 1.3 % IJ SUSP
INTRAMUSCULAR | Status: AC
  Filled 2022-11-20: qty 20

## 2022-11-20 MED ORDER — DEXAMETHASONE SODIUM PHOSPHATE 10 MG/ML IJ SOLN
INTRAMUSCULAR | Status: DC | PRN
  Administered 2022-11-20: 4 mg via INTRAVENOUS

## 2022-11-20 MED ORDER — ALBUMIN HUMAN 5 % IV SOLN: INTRAVENOUS | Status: DC | PRN

## 2022-11-20 MED ORDER — DIAZEPAM 5 MG/ML IJ SOLN
2.5000 mg | Freq: Once | INTRAMUSCULAR | Status: DC
  Filled 2022-11-20: qty 2

## 2022-11-20 MED ORDER — PIPERACILLIN-TAZOBACTAM 3.375 G IVPB
3.3750 g | Freq: Three times a day (TID) | INTRAVENOUS | Status: DC
  Administered 2022-11-21: 3.375 g via INTRAVENOUS
  Filled 2022-11-20: qty 50

## 2022-11-20 MED ORDER — FENTANYL CITRATE (PF) 250 MCG/5ML IJ SOLN
INTRAMUSCULAR | Status: DC | PRN
  Administered 2022-11-20 (×2): 50 ug via INTRAVENOUS

## 2022-11-20 MED ORDER — ACETAMINOPHEN 650 MG RE SUPP: 650.0000 mg | Freq: Four times a day (QID) | RECTAL | Status: DC | PRN

## 2022-11-20 MED ORDER — TRAMADOL HCL 50 MG PO TABS
50.0000 mg | ORAL_TABLET | Freq: Four times a day (QID) | ORAL | Status: DC | PRN
  Administered 2022-11-26: 50 mg via ORAL
  Filled 2022-11-20: qty 1

## 2022-11-20 MED ORDER — FENTANYL CITRATE (PF) 250 MCG/5ML IJ SOLN
INTRAMUSCULAR | Status: AC
  Filled 2022-11-20: qty 5

## 2022-11-20 MED ORDER — ONDANSETRON HCL 4 MG/2ML IJ SOLN: 4.0000 mg | Freq: Four times a day (QID) | INTRAMUSCULAR | Status: DC | PRN

## 2022-11-20 MED ORDER — HYDRALAZINE HCL 20 MG/ML IJ SOLN
10.0000 mg | INTRAMUSCULAR | Status: DC | PRN
  Administered 2022-11-20: 10 mg via INTRAVENOUS
  Filled 2022-11-20: qty 1

## 2022-11-20 MED ORDER — BUPIVACAINE HCL (PF) 0.25 % IJ SOLN
INTRAMUSCULAR | Status: DC | PRN
  Administered 2022-11-20: 30 mL

## 2022-11-20 MED ORDER — EPHEDRINE 5 MG/ML INJ
INTRAVENOUS | Status: AC
  Filled 2022-11-20: qty 10

## 2022-11-20 MED ORDER — SODIUM CHLORIDE 0.9 % IV SOLN
INTRAVENOUS | Status: DC | PRN
  Administered 2022-11-20: 20 mL

## 2022-11-20 MED ORDER — SODIUM CHLORIDE 0.9 % IV SOLN: INTRAVENOUS | Status: DC | PRN

## 2022-11-20 MED ORDER — PHENYLEPHRINE 80 MCG/ML (10ML) SYRINGE FOR IV PUSH (FOR BLOOD PRESSURE SUPPORT)
PREFILLED_SYRINGE | INTRAVENOUS | Status: AC
  Filled 2022-11-20: qty 30

## 2022-11-20 MED ORDER — PROPOFOL 10 MG/ML IV BOLUS
INTRAVENOUS | Status: AC
  Filled 2022-11-20: qty 20

## 2022-11-20 MED ORDER — PIPERACILLIN-TAZOBACTAM 3.375 G IVPB 30 MIN
3.3750 g | Freq: Once | INTRAVENOUS | Status: AC
  Administered 2022-11-20: 3.375 g via INTRAVENOUS
  Filled 2022-11-20: qty 50

## 2022-11-20 MED ORDER — HYDROMORPHONE HCL 1 MG/ML IJ SOLN: 0.5000 mg | INTRAMUSCULAR | Status: DC | PRN

## 2022-11-20 MED ORDER — OXYCODONE HCL 5 MG/5ML PO SOLN: 5.0000 mg | Freq: Once | ORAL | Status: DC | PRN

## 2022-11-20 MED ORDER — PHENYLEPHRINE 80 MCG/ML (10ML) SYRINGE FOR IV PUSH (FOR BLOOD PRESSURE SUPPORT)
PREFILLED_SYRINGE | INTRAVENOUS | Status: DC | PRN
  Administered 2022-11-20: 160 ug via INTRAVENOUS

## 2022-11-20 MED ORDER — IOHEXOL 350 MG/ML SOLN
60.0000 mL | Freq: Once | INTRAVENOUS | Status: AC | PRN
  Administered 2022-11-20: 60 mL via INTRAVENOUS

## 2022-11-20 MED ORDER — 0.9 % SODIUM CHLORIDE (POUR BTL) OPTIME
TOPICAL | Status: DC | PRN
  Administered 2022-11-20: 1000 mL

## 2022-11-20 MED ORDER — BUPIVACAINE HCL (PF) 0.25 % IJ SOLN
INTRAMUSCULAR | Status: AC
  Filled 2022-11-20: qty 30

## 2022-11-20 SURGICAL SUPPLY — 53 items
BAG COUNTER SPONGE SURGICOUNT (BAG) ×1 IMPLANT
BENZOIN TINCTURE PRP APPL 2/3 (GAUZE/BANDAGES/DRESSINGS) IMPLANT
BLADE CLIPPER SURG (BLADE) IMPLANT
CANISTER SUCT 3000ML PPV (MISCELLANEOUS) ×1 IMPLANT
CELLS DAT CNTRL 66122 CELL SVR (MISCELLANEOUS) IMPLANT
CHLORAPREP W/TINT 26 (MISCELLANEOUS) ×1 IMPLANT
COVER SURGICAL LIGHT HANDLE (MISCELLANEOUS) IMPLANT
DRAIN PENROSE 0.5X18 (DRAIN) IMPLANT
DRAIN PENROSE 12X.25 LTX STRL (MISCELLANEOUS) IMPLANT
DRAPE LAPAROTOMY TRNSV 102X78 (DRAPES) ×1 IMPLANT
DRSG COVADERM 4X6 (GAUZE/BANDAGES/DRESSINGS) ×1 IMPLANT
ELECT CAUTERY BLADE 6.4 (BLADE) ×1 IMPLANT
ELECT REM PT RETURN 9FT ADLT (ELECTROSURGICAL) ×1
ELECTRODE REM PT RTRN 9FT ADLT (ELECTROSURGICAL) ×1 IMPLANT
GAUZE SPONGE 4X4 12PLY STRL (GAUZE/BANDAGES/DRESSINGS) ×1 IMPLANT
GLOVE BIOGEL PI IND STRL 7.0 (GLOVE) ×1 IMPLANT
GLOVE SURG SS PI 7.0 STRL IVOR (GLOVE) ×1 IMPLANT
GOWN STRL REUS W/ TWL LRG LVL3 (GOWN DISPOSABLE) ×2 IMPLANT
GOWN STRL REUS W/TWL LRG LVL3 (GOWN DISPOSABLE) ×2
HANDLE SUCTION POOLE (INSTRUMENTS) IMPLANT
KIT BASIN OR (CUSTOM PROCEDURE TRAY) ×1 IMPLANT
KIT TURNOVER KIT B (KITS) ×1 IMPLANT
NDL 22X1.5 STRL (OR ONLY) (MISCELLANEOUS) ×1 IMPLANT
NDL HYPO 25GX1X1/2 BEV (NEEDLE) IMPLANT
NEEDLE 22X1.5 STRL (OR ONLY) (MISCELLANEOUS) ×1 IMPLANT
NEEDLE HYPO 25GX1X1/2 BEV (NEEDLE) ×1 IMPLANT
NS IRRIG 1000ML POUR BTL (IV SOLUTION) ×1 IMPLANT
PACK GENERAL/GYN (CUSTOM PROCEDURE TRAY) ×1 IMPLANT
PAD ARMBOARD 7.5X6 YLW CONV (MISCELLANEOUS) ×1 IMPLANT
PENCIL SMOKE EVACUATOR (MISCELLANEOUS) ×1 IMPLANT
RELOAD PROXIMATE 75MM BLUE (ENDOMECHANICALS) ×2 IMPLANT
RELOAD STAPLE 75 3.8 BLU REG (ENDOMECHANICALS) IMPLANT
RETRACTOR WND ALEXIS 18 MED (MISCELLANEOUS) IMPLANT
RTRCTR WOUND ALEXIS 18CM MED (MISCELLANEOUS)
RTRCTR WOUND ALEXIS 18CM SML (INSTRUMENTS)
SAVER CELL AAL HAEMONETICS (INSTRUMENTS) IMPLANT
STAPLER GUN LINEAR PROX 60 (STAPLE) IMPLANT
STAPLER PROXIMATE 75MM BLUE (STAPLE) IMPLANT
STRIP CLOSURE SKIN 1/2X4 (GAUZE/BANDAGES/DRESSINGS) IMPLANT
SUCTION POOLE HANDLE (INSTRUMENTS) ×1
SUT MNCRL AB 4-0 PS2 18 (SUTURE) ×1 IMPLANT
SUT PDS AB 0 CT1 27 (SUTURE) ×1 IMPLANT
SUT PDS AB 2-0 CT1 27 (SUTURE) IMPLANT
SUT PROLENE 2 0 CT2 30 (SUTURE) ×3 IMPLANT
SUT SILK 3 0SH CR/8 30 (SUTURE) IMPLANT
SUT VIC AB 2-0 SH 27 (SUTURE)
SUT VIC AB 2-0 SH 27X BRD (SUTURE) IMPLANT
SUT VIC AB 3-0 SH 18 (SUTURE) ×1 IMPLANT
SUT VIC AB 3-0 SH 27 (SUTURE) ×1
SUT VIC AB 3-0 SH 27XBRD (SUTURE) ×1 IMPLANT
SYR CONTROL 10ML LL (SYRINGE) IMPLANT
TOWEL GREEN STERILE (TOWEL DISPOSABLE) ×1 IMPLANT
TOWEL GREEN STERILE FF (TOWEL DISPOSABLE) ×1 IMPLANT

## 2022-11-20 NOTE — Transfer of Care (Signed)
Immediate Anesthesia Transfer of Care Note  Patient: Trevor Murphy  Procedure(s) Performed: OPEN LEFT INGUINAL HERNIA REPAIR FOR GANGRENE AND SMALL BOWEL RESECTION WITH STINASTIMOSIST (Left)  Patient Location: PACU  Anesthesia Type:General  Level of Consciousness: drowsy  Airway & Oxygen Therapy: Patient Spontanous Breathing  Post-op Assessment: Report given to RN and Post -op Vital signs reviewed and stable  Post vital signs: Reviewed and stable  Last Vitals:  Vitals Value Taken Time  BP 152/57 11/20/22 2230  Temp    Pulse 53 11/20/22 2234  Resp 16 11/20/22 2234  SpO2 99 % 11/20/22 2234  Vitals shown include unvalidated device data.  Last Pain:  Vitals:   11/20/22 2230  PainSc: Asleep         Complications: No notable events documented.

## 2022-11-20 NOTE — ED Provider Triage Note (Signed)
Emergency Medicine Provider Triage Evaluation Note  Trevor Murphy , a 88 y.o. male  was evaluated in triage.  Pt complains of low abdominal pain.  Patient has a history of ventral and left inguinal hernia repair.  Says that over the past couple of days he has been having worsening pain however he had acute onset low abdominal pain around the left inguinal hernia that caused nausea, vomiting.  Still having normal bowel movements.  Review of Systems  Positive:  Negative:  Physical Exam  BP (!) 184/61   Pulse (!) 53   Resp 16   Ht 5' 10.5" (1.791 m)   Wt 61.7 kg   SpO2 100%   BMI 19.24 kg/m  Gen:   Awake, no distress   Resp:  Normal effort  MSK:   Moves extremities without difficulty  Other:  Ventral/periumbilical hernia that I am unable to reduce.  Indurated left inguinal hernia, unable to maneuver.  Poorly performed testicular exam due to patient's positioning in the wheelchair.  Medical Decision Making  Medically screening exam initiated at 2:11 PM.  Appropriate orders placed.  [name] was informed that the remainder of the evaluation will be completed by another provider, this initial triage assessment does not replace that evaluation, and the importance of remaining in the ED until their evaluation is complete.    Somewhat concern for incarcerated hernia.  Potentially may just need pain control for hernia reduction.  CT ordered and patient will have the next available room   Adell Panek A, PA-C 11/20/22 1413

## 2022-11-20 NOTE — Anesthesia Preprocedure Evaluation (Addendum)
Anesthesia Evaluation  Patient identified by MRN, date of birth, ID band Patient awake    Reviewed: Allergy & Precautions, NPO status , Patient's Chart, lab work & pertinent test results  History of Anesthesia Complications Negative for: history of anesthetic complications  Airway Mallampati: I  TM Distance: >3 FB Neck ROM: Full    Dental  (+) Edentulous Upper, Edentulous Lower   Pulmonary neg pulmonary ROS   breath sounds clear to auscultation       Cardiovascular hypertension, Pt. on medications + CAD (non-obstructive by cath 2011) and +CHF  + dysrhythmias Supra Ventricular Tachycardia + Valvular Problems/Murmurs (severe) MR  Rhythm:Regular Rate:Normal + Systolic murmurs 03/3337 ECHO: 1. Proximal septal thickening, Basal inferior hypokinesis. Left  ventricular EF 55 to 60%. The LV has normal function. There is mild LVH.   2. Right ventricular systolic function is normal. The right ventricular size is normal.   3. MR is somewhat eccentric. It appears at least moderately severe to severe. Consider TEE to further evaluate mechanism/severity. The mitral valve is normal in structure.   4. The aortic valve is tricuspid. Aortic valve regurgitation is moderate. Aortic valve sclerosis is present, with no evidence of aortic valve stenosis.     Neuro/Psych  Headaches CVA    GI/Hepatic hiatal hernia,GERD  ,,(+) Cirrhosis   ascites    Incarcerated inguinal hernia   Endo/Other  negative endocrine ROS    Renal/GU Renal InsufficiencyRenal disease     Musculoskeletal  (+) Arthritis ,    Abdominal   Peds  Hematology  (+) Blood dyscrasia (Hb 10.6, plt 109k), anemia INR 1.4   Anesthesia Other Findings   Reproductive/Obstetrics                             Anesthesia Physical Anesthesia Plan  ASA: 4 and emergent  Anesthesia Plan: General   Post-op Pain Management: Dilaudid IV   Induction: Intravenous  and Rapid sequence  PONV Risk Score and Plan: 2 and Ondansetron and Dexamethasone  Airway Management Planned: Oral ETT  Additional Equipment: None  Intra-op Plan:   Post-operative Plan: Extubation in OR  Informed Consent: I have reviewed the patients History and Physical, chart, labs and discussed the procedure including the risks, benefits and alternatives for the proposed anesthesia with the patient or authorized representative who has indicated his/her understanding and acceptance.   Patient has DNR.  Discussed DNR with patient, Suspend DNR and Discussed DNR with power of attorney.   Consent reviewed with POA  Plan Discussed with: CRNA and Surgeon  Anesthesia Plan Comments: (Discussed with patient and his son   )        Anesthesia Quick Evaluation

## 2022-11-20 NOTE — Anesthesia Postprocedure Evaluation (Signed)
Anesthesia Post Note  Patient: Trevor Murphy  Procedure(s) Performed: OPEN LEFT INGUINAL HERNIA REPAIR FOR GANGRENE AND SMALL BOWEL RESECTION WITH STINASTIMOSIST (Left)     Patient location during evaluation: PACU Anesthesia Type: General Level of consciousness: awake and alert, patient cooperative and oriented Pain management: pain level controlled Vital Signs Assessment: post-procedure vital signs reviewed and stable Respiratory status: spontaneous breathing, nonlabored ventilation and respiratory function stable Cardiovascular status: blood pressure returned to baseline and stable Postop Assessment: no apparent nausea or vomiting Anesthetic complications: no   No notable events documented.  Last Vitals:  Vitals:   11/20/22 2245 11/20/22 2300  BP: (!) 150/60 (!) 144/56  Pulse: (!) 54 (!) 52  Resp: 16 17  Temp:    SpO2: 97% 97%    Last Pain:  Vitals:   11/20/22 2300  PainSc: Asleep                 Kharisma Glasner,E. Salah Burlison

## 2022-11-20 NOTE — ED Notes (Signed)
Pt to ct 

## 2022-11-20 NOTE — H&P (Signed)
History and Physical    Trevor Murphy WJX:[id number] DOB: 1935/03/19 DOA: 11/20/2022  PCP: [name], FNP  Patient coming from: Home  I have personally briefly reviewed patient's old medical records in [facility]  Chief Complaint: Abdominal pain, nausea, vomiting  HPI: Trevor Murphy is a 88 y.o. male with medical history significant for chronic diastolic CHF (EF 21-30%), CAD, CKD stage IIIb, PAF no longer on anticoagulation, bradycardia, cryptogenic stroke s/p ILR, hepatic cirrhosis with thrombocytopenia, severe mitral regurgitation who presented to the ED for evaluation of abdominal pain with nausea and vomiting.  Patient reports acute onset of left lower abdominal pain beginning this morning.  He has had nausea and vomiting.  He reports known chronic bilateral inguinal hernias.  He denies diarrhea, constipation, dysuria, fevers, chills.  He lives at home alone but has family that checks in on him.  He uses a wheelchair outside of the house.  Recently hospitalized 09/23/2022-09/28/2022 with decompensated hepatic cirrhosis with ascites.  Required paracentesis with 2.1 L fluid removed.  Started on spironolactone.  He had severe thrombocytopenia and was considered high risk for bleeding therefore after shared decision making Eliquis was discontinued.  ED Course  Labs/Imaging on admission: I have personally reviewed following labs and imaging studies.  Initial vitals showed BP 184/61, pulse 53, RR 16, temperature not documented, SpO2 100% on room air.  Labs show WBC 7.0, hemoglobin 10.6, platelets 109,000, sodium 137, potassium 5.1, bicarb 17, BUN 26, creatinine 1.60, serum glucose 189, LFTs within normal limits, lactic acid 3.3, lipase 40.  CT abdomen/pelvis with contrast shows left groin hernia containing short segment of small bowel and moderate to large volume of fluid.  Small bowel loops within the hernia sac is dilated.  Wall thickening and mural edema in the right colon and  transverse colon noted.  18 mm polypoid soft tissue lesion posterior bladder base also seen.  6 mm hypodensity in the pancreatic head reported.  Cirrhosis with small to moderate volume ascites and prominent esophageal and paraesophageal varices, cholelithiasis, 1-2 mm nonobstructing stone lower pole right kidney also noted.  Patient was given IV morphine 4 mg, IV Valium 2.5 mg, and IV Zosyn.  General surgery consulted and have evaluated the patient.  Plan is for OR tonight for left inguinal hernia reduction and repair.  Due to multiple chronic medical comorbidities the hospitalist service was consulted to admit for further evaluation and management.  Review of Systems: All systems reviewed and are negative except as documented in history of present illness above.   Past Medical History:  Diagnosis Date   Arthritis    CAD (coronary artery disease)    a. Cath 08/2009: minimal nonobstructive disease (25% LAD). b. Lexiscan nuc 6/215: EF 54%, no ischemia or infarction.   Chronic diastolic CHF (congestive heart failure)    a. Echo 2013: EF 55-60%, mild AI, mild MR. b. Echo 5/15 with EF 60-65%, no WMA, mild LVH, mild AI.   CKD (chronic kidney disease) stage 3, GFR 30-59 ml/min 07/26/2012   a. Felt likely due to HTN.   CVA (cerebral infarction) 07/28/2012   Glaucoma    Gout    H/O umbilical hernia repair    Headache(784.0)    " WHEN MY BLOOD PRESSURE IS UP"   History of acute renal failure    History of alcohol abuse    HTN (hypertension)    Hypertension    a. Patent renal arteries 2005.   Noncompliance    OA (osteoarthritis)  Other and unspecified hyperlipidemia    Poor circulation    Sinus bradycardia    SVT (supraventricular tachycardia)    a. Possible AVNRT.  Got adenosine from EMS in 2015 for SVT.  During 5/15 hospitalization had episode of short R-P tachycardia (possible AVNRT) terminated by adenosine.  b. 04/2016 - recurrent tachycardia (SVT vs. AVNRT), again terminated by Adenosine    Visual impairment     Past Surgical History:  Procedure Laterality Date   APPENDECTOMY     BIOPSY  08/12/2020   Procedure: BIOPSY;  Surgeon: Charna Elizabeth, MD;  Location: [facility] ENDOSCOPY;  Service: Endoscopy;;   BUBBLE STUDY  05/24/2019   Procedure: BUBBLE STUDY;  Surgeon: Little Ishikawa, MD;  Location: [facility] ENDOSCOPY;  Service: Endoscopy;;   CARDIAC CATHETERIZATION  11/2003   EF 50-55%   CARDIAC CATHETERIZATION  08/2009   CATARACT EXTRACTION EXTRACAPSULAR Right 05/17/2013   Procedure: CATARACT EXTRACTION EXTRACAPSULAR WITH INTRAOCULAR LENS PLACEMENT (IOC) RIGHT EYE;  Surgeon: Chalmers Guest, MD;  Location: [facility] OR;  Service: Ophthalmology;  Laterality: Right;   COLONOSCOPY     ESOPHAGOGASTRODUODENOSCOPY (EGD) WITH PROPOFOL N/A 08/12/2020   Procedure: ESOPHAGOGASTRODUODENOSCOPY (EGD) WITH PROPOFOL;  Surgeon: Charna Elizabeth, MD;  Location: [facility] ENDOSCOPY;  Service: Endoscopy;  Laterality: N/A;   HERNIA REPAIR     IR PARACENTESIS  09/24/2022   LOOP RECORDER INSERTION N/A 05/24/2019   Procedure: LOOP RECORDER INSERTION;  Surgeon: Regan Lemming, MD;  Location: [facility] INVASIVE CV LAB;  Service: Cardiovascular;  Laterality: N/A;   TEE WITHOUT CARDIOVERSION N/A 05/24/2019   Procedure: TRANSESOPHAGEAL ECHOCARDIOGRAM (TEE);  Surgeon: Little Ishikawa, MD;  Location: [facility] ENDOSCOPY;  Service: Endoscopy;  Laterality: N/A;   VENTRAL HERNIA REPAIR  03/07/2012   Procedure: LAPAROSCOPIC VENTRAL HERNIA;  Surgeon: Cherylynn Ridges, MD;  Location: [facility] OR;  Service: General;  Laterality: N/A;    Social History:  reports that he has never smoked. He has never used smokeless tobacco. He reports that he does not drink alcohol and does not use drugs.  No Known Allergies  Family History  Problem Relation Age of Onset   CVA Mother    Hypertension Brother    Hypertension Sister    Hypertension Brother    Stroke Sister    Heart attack Neg Hx      Prior to Admission medications   Medication Sig Start Date  End Date Taking? Authorizing Provider  acetaminophen (TYLENOL) 500 MG tablet Take 1,000 mg by mouth 2 (two) times daily as needed for mild pain or moderate pain (leg pain).    [provider]  dapagliflozin propanediol ([name]) 10 MG TABS tablet Take 1 tablet (10 mg total) by mouth daily before breakfast. 11/18/22   [name], FNP  ferrous sulfate (FEROSUL) 325 (65 FE) MG tablet TAKE 1 TABLET(325 MG) BY MOUTH DAILY 11/09/22   [name], FNP  finasteride (PROSCAR) 5 MG tablet Take 5 mg by mouth daily. 03/09/22   [provider]  LUMIGAN 0.01 % SOLN Place 1 drop into both eyes at bedtime. 08/28/19   [provider]  SIMBRINZA 1-0.2 % SUSP Follow up with your PCP on when to restart this medication. Patient taking differently: Place 1 drop into both eyes 3 (three) times daily. 06/09/22   Narda Bonds, MD  spironolactone (ALDACTONE) 50 MG tablet Take 1 tablet (50 mg total) by mouth daily. 09/29/22   Lewie Chamber, MD    Physical Exam: Vitals:   11/20/22 1645 11/20/22 1805 11/20/22 1900 11/20/22 2020  BP: (!) 168/75 (!) 182/72 (!) 190/65 (!) 171/75  Pulse: (!) 50 (!) 35 (!) 46 (!) 52  Resp: 14 19 19 19   SpO2: 97% 100% 99% 100%  Weight:      Height:       Constitutional: Chronic ill-appearing man resting supine in bed Eyes: Exophthalmos ENMT: Mucous membranes are moist. Posterior pharynx clear of any exudate or lesions.Normal dentition.  Neck: normal, supple, no masses. Respiratory: clear to auscultation anteriorly. Normal respiratory effort. No accessory muscle use.  Cardiovascular: Bradycardic, no murmurs / rubs / gallops. No extremity edema. 2+ pedal pulses. Abdomen: Bilateral inguinal hernias, left nonreducible and tender to palpation Musculoskeletal: Thin extremities, ROM intact Skin: no rashes, lesions, ulcers. No induration Neurologic: Sensation intact. Strength equal bilaterally. Psychiatric:  Alert and oriented x 3. Normal mood.   EKG: Personally  reviewed.  Ordered and pending  Assessment/Plan Principal Problem:   Incarcerated left inguinal hernia Active Problems:   Cirrhosis of liver with ascites   Chronic kidney disease, stage 3b   Paroxysmal atrial fibrillation   Chronic diastolic CHF (congestive heart failure)   Essential hypertension   Sinus bradycardia   History of CVA (cerebrovascular accident)   Thrombocytopenia   Severe mitral regurgitation   Trevor Murphy is a 88 y.o. male with medical history significant for chronic diastolic CHF (EF 73-42%), CAD, CKD stage IIIb, PAF no longer on anticoagulation, bradycardia, cryptogenic stroke s/p ILR, hepatic cirrhosis with thrombocytopenia, severe mitral regurgitation who is admitted with incarcerated left inguinal hernia.  Assessment and Plan: Incarcerated left inguinal hernia: General surgery consulted and planning for surgical reduction and repair tonight.  Patient has multiple chronic comorbidities placing him at increased perioperative risk (10.1% 30-day risk of death, MI, cardiac arrest by RCRI). -Keep n.p.o. -Continue IV Zosyn -Analgesics as needed -OR tonight per general surgery  Paroxysmal atrial fibrillation with sinus bradycardia: He is in sinus bradycardia on admission.  Appears to be asymptomatic from this standpoint.  Not on rate controlling agents.  He is off anticoagulation after prior shared decision making on recent admission.  Chronic diastolic CHF/severe mitral regurgitation: EF 55-60%.  Does not appear to be in decompensated CHF.  He is on spironolactone and [name] as an outpatient, currently on hold can resume when cleared from surgical standpoint for oral meds.  Monitor I/O's and daily weights.  History of cryptogenic stroke: Has ILR in place, recent review shows episodes of bradycardia as an outpatient.  Off of anticoagulation as above.  Hepatic cirrhosis with ascites: Small to moderate ascites noted on CT imaging.  He is mentating well on admission,  alert and oriented x 4.  No current need for paracentesis.  CKD stage IIIb: Renal function is stable.  Continue to monitor.  Hypertension: BP elevated on admission.  Spironolactone on hold as above.  Continue IV hydralazine as needed.  Thrombocytopenia: Platelets 109,000 improved from prior.  No indication for transfusion.  Bladder lesion: 18 mm polypoid soft tissue lesion posterior bladder base noted on CT imaging.  Potentially median lobe hypertrophy of the prostate gland although urothelial lesion not excluded per radiology read.  Findings discussed with patient's son.  Consider outpatient urology follow-up pending further goals of care.  Pancreatic head lesion: 6 mm hypodensity in pancreatic head noted on CT imaging.  This is felt to be a cyst or pseudocyst per radiology read.  Consider follow-up CT abdomen with contrast in 2 years pending goals of care.   DVT prophylaxis: SCDs Start: 11/20/22 1938 Code Status: DNR,  confirmed with patient and son at bedside.  ACP documents reviewed. Family Communication: Son at bedside Disposition Plan: From home, dispo pending clinical progress Consults called: Surgery Severity of Illness: The appropriate patient status for this patient is INPATIENT. Inpatient status is judged to be reasonable and necessary in order to provide the required intensity of service to ensure the patient's safety. The patient's presenting symptoms, physical exam findings, and initial radiographic and laboratory data in the context of their chronic comorbidities is felt to place them at high risk for further clinical deterioration. Furthermore, it is not anticipated that the patient will be medically stable for discharge from the hospital within 2 midnights of admission.   * I certify that at the point of admission it is my clinical judgment that the patient will require inpatient hospital care spanning beyond 2 midnights from the point of admission due to high intensity of  service, high risk for further deterioration and high frequency of surveillance required.Darreld Mclean MD [facility]  If 7PM-7AM, please contact night-coverage [email or web address]  11/20/2022, 8:24 PM

## 2022-11-20 NOTE — ED Provider Notes (Signed)
[name] EMERGENCY DEPARTMENT AT [facility] Provider Note   CSN: [id number] Arrival date & time: 11/20/22  1304     History  Chief Complaint  Patient presents with   Abdominal Pain   Emesis   Nausea    Trevor Murphy is a 88 y.o. male.  Patient is an 88 year old male with a past medical history of hypertension, hyperlipidemia, diabetes, CHF, CAD, cirrhosis, paroxysmal A-fib no longer on anticoagulation and prior ventral hernia repair presenting to the emergency department with abdominal pain.  Patient states that he woke up this morning with left lower quadrant abdominal pain.  He states he has had associated nausea and vomiting.  He denies any diarrhea or constipation, dysuria or hematuria.  He denies any fevers or chills.  He states that he does have a known inguinal hernia that he states that he has been unable to compress for at least a month.  He claims that he saw his surgeon about this and was told there is nothing to do unless it was causing him problems.  Per patient's recent admission records his left inguinal hernia was notable but was really reducible at that time.  He was recommended outpatient general surgery follow-up.  He also reports that he had an appendectomy several years ago.  The history is provided by the patient and medical records.  Abdominal Pain Associated symptoms: vomiting   Emesis Associated symptoms: abdominal pain        Home Medications Prior to Admission medications   Medication Sig Start Date End Date Taking? Authorizing Provider  acetaminophen (TYLENOL) 500 MG tablet Take 1,000 mg by mouth 2 (two) times daily as needed for mild pain or moderate pain (leg pain).    [provider]  dapagliflozin propanediol ([name]) 10 MG TABS tablet Take 1 tablet (10 mg total) by mouth daily before breakfast. 11/18/22   [name], FNP  ferrous sulfate (FEROSUL) 325 (65 FE) MG tablet TAKE 1 TABLET(325 MG) BY MOUTH DAILY 11/09/22   [name], FNP  finasteride (PROSCAR) 5 MG tablet Take 5 mg by mouth daily. 03/09/22   [provider]  LUMIGAN 0.01 % SOLN Place 1 drop into both eyes at bedtime. 08/28/19   [provider]  SIMBRINZA 1-0.2 % SUSP Follow up with your PCP on when to restart this medication. Patient taking differently: Place 1 drop into both eyes 3 (three) times daily. 06/09/22   Narda Bonds, MD  spironolactone (ALDACTONE) 50 MG tablet Take 1 tablet (50 mg total) by mouth daily. 09/29/22   Lewie Chamber, MD      Allergies    Patient has no known allergies.    Review of Systems   Review of Systems  Gastrointestinal:  Positive for abdominal pain and vomiting.    Physical Exam Updated Vital Signs BP (!) 166/106   Pulse (!) 51   Resp 17   Ht 5' 10.5" (1.791 m)   Wt 61.7 kg   SpO2 100%   BMI 19.24 kg/m  Physical Exam Vitals and nursing note reviewed.  Constitutional:      Appearance: He is well-developed.     Comments: Slightly cachectic appearing  HENT:     Head: Normocephalic and atraumatic.     Mouth/Throat:     Mouth: Mucous membranes are moist.     Pharynx: Oropharynx is clear.  Eyes:     Extraocular Movements: Extraocular movements intact.  Cardiovascular:     Rate and Rhythm: Normal rate and  regular rhythm.     Heart sounds: Normal heart sounds.  Pulmonary:     Effort: Pulmonary effort is normal.     Breath sounds: Normal breath sounds.  Abdominal:     General: Abdomen is flat.     Palpations: Abdomen is soft.     Tenderness: There is abdominal tenderness (L inguinal hernia). There is no guarding or rebound.     Hernia: A hernia is present. Hernia is present in the ventral area (Soft, compressible), left inguinal area (~5x8 cm, hard and tender to palpation, non-compressible, no overlying skin changes) and right inguinal area (~2-3 cm diameter, soft, compressible).  Skin:    General: Skin is warm and dry.  Neurological:     General: No focal deficit present.      Mental Status: He is alert and oriented to person, place, and time.  Psychiatric:        Mood and Affect: Mood normal.        Behavior: Behavior normal.     ED Results / Procedures / Treatments   Labs (all labs ordered are listed, but only abnormal results are displayed) Labs Reviewed  COMPREHENSIVE METABOLIC PANEL - Abnormal; Notable for the following components:      Result Value   Chloride 113 (*)    CO2 17 (*)    Glucose, Bld 189 (*)    BUN 26 (*)    Creatinine, Ser 1.60 (*)    Calcium 8.2 (*)    Albumin 2.8 (*)    GFR, Estimated 42 (*)    All other components within normal limits  CBC - Abnormal; Notable for the following components:   RBC 3.57 (*)    Hemoglobin 10.6 (*)    HCT 33.8 (*)    Platelets 109 (*)    All other components within normal limits  LIPASE, BLOOD  URINALYSIS, ROUTINE W REFLEX MICROSCOPIC  LACTIC ACID, PLASMA  LACTIC ACID, PLASMA  PROTIME-INR    EKG None  Radiology No results found.  Procedures Procedures    Medications Ordered in ED Medications  morphine (PF) 4 MG/ML injection 4 mg (has no administration in time range)  ondansetron (ZOFRAN) injection 4 mg (has no administration in time range)    ED Course/ Medical Decision Making/ A&P Clinical Course as of 11/20/22 1618  Fri Nov 20, 2022  1616 Patient signed out to Dr. Lockie Mola pending CTAP and reassessment. [VK]    Clinical Course User Index [VK] Rexford Maus, DO                             Medical Decision Making This patient presents to the ED with chief complaint(s) of abdominal pain with pertinent past medical history of cirrhosis, CHF, CKD, CAD, paroxysmal A-fib not on anticoagulation, prior ventral hernia repair which further complicates the presenting complaint. The complaint involves an extensive differential diagnosis and also carries with it a high risk of complications and morbidity.    The differential diagnosis includes incarcerated versus strangulated  hernia, Pancreatitis, hepatitis, cholelithiasis, cholecystitis, other intra-abdominal infection, considering UTI, considering possible SBP though abdomen does not feel significantly distended with ascites on exam  Additional history obtained: Additional history obtained from N/A Records reviewed previous admission documents  ED Course and Reassessment: Patient was initially evaluated by provider in triage and had basic labs and urine performed.  He will additionally have a lactic acid performed in the setting of possible incarcerated versus strangulated  hernia.  He will need a CT abdomen pelvis as I am unable to read reduce his left inguinal hernia on exam.  He was given nausea and pain control and will be closely reassessed.  Independent labs interpretation:  The following labs were independently interpreted: Cr improved from baseline, anemia and thrombocytopenia at baseline  Independent visualization of imaging: - Pending    Amount and/or Complexity of Data Reviewed Labs: ordered. Radiology: ordered.  Risk Prescription drug management.          Final Clinical Impression(s) / ED Diagnoses Final diagnoses:  None    Rx / DC Orders ED Discharge Orders     None         Rexford Maus, DO 11/20/22 1618

## 2022-11-20 NOTE — ED Notes (Signed)
ED TO INPATIENT HANDOFF REPORT  ED Nurse Name and Phone #: Theadora Rama RN [phone]  S Name/Age/Gender Trevor Murphy 88 y.o. male Room/Bed: 009C/009C  Code Status   Code Status: DNR  Home/SNF/Other Home Patient oriented to: self, place, time, and situation Is this baseline? Yes   Triage Complete: Triage complete  Chief Complaint Incarcerated left inguinal hernia [K40.30]  Triage Note Pt came in via pOV d/t lower ab pain tat began today & has been causing n/v, denies diarrhea. Reports he has a hernia that has been bothering him in his lower ab as well, A/Ox4, rate pain 10/10 while in triage.    Allergies No Known Allergies  Level of Care/Admitting Diagnosis ED Disposition     ED Disposition  Admit   Condition  --   Comment  Hospital Area: [facility] [[id number]]  Level of Care: Progressive [102]  Admit to Progressive based on following criteria: MULTISYSTEM THREATS such as stable sepsis, metabolic/electrolyte imbalance with or without encephalopathy that is responding to early treatment.  May admit patient to [facility] or [facility] if equivalent level of care is available:: No  Covid Evaluation: Asymptomatic - no recent exposure (last 10 days) testing not required  Diagnosis: Incarcerated left inguinal hernia [[id number]]  Admitting Physician: Charlsie Quest [[id number]]  Attending Physician: Charlsie Quest [[id number]]  Certification:: I certify this patient will need inpatient services for at least 2 midnights  Estimated Length of Stay: 4          B Medical/Surgery History Past Medical History:  Diagnosis Date   Arthritis    CAD (coronary artery disease)    a. Cath 08/2009: minimal nonobstructive disease (25% LAD). b. Lexiscan nuc 6/215: EF 54%, no ischemia or infarction.   Chronic diastolic CHF (congestive heart failure)    a. Echo 2013: EF 55-60%, mild AI, mild MR. b. Echo 5/15 with EF 60-65%, no WMA, mild LVH, mild AI.   CKD (chronic kidney disease)  stage 3, GFR 30-59 ml/min 07/26/2012   a. Felt likely due to HTN.   CVA (cerebral infarction) 07/28/2012   Glaucoma    Gout    H/O umbilical hernia repair    Headache(784.0)    " WHEN MY BLOOD PRESSURE IS UP"   History of acute renal failure    History of alcohol abuse    HTN (hypertension)    Hypertension    a. Patent renal arteries 2005.   Noncompliance    OA (osteoarthritis)    Other and unspecified hyperlipidemia    Poor circulation    Sinus bradycardia    SVT (supraventricular tachycardia)    a. Possible AVNRT.  Got adenosine from EMS in 2015 for SVT.  During 5/15 hospitalization had episode of short R-P tachycardia (possible AVNRT) terminated by adenosine.  b. 04/2016 - recurrent tachycardia (SVT vs. AVNRT), again terminated by Adenosine   Visual impairment    Past Surgical History:  Procedure Laterality Date   APPENDECTOMY     BIOPSY  08/12/2020   Procedure: BIOPSY;  Surgeon: Charna Elizabeth, MD;  Location: [facility] ENDOSCOPY;  Service: Endoscopy;;   BUBBLE STUDY  05/24/2019   Procedure: BUBBLE STUDY;  Surgeon: Little Ishikawa, MD;  Location: [facility] ENDOSCOPY;  Service: Endoscopy;;   CARDIAC CATHETERIZATION  11/2003   EF 50-55%   CARDIAC CATHETERIZATION  08/2009   CATARACT EXTRACTION EXTRACAPSULAR Right 05/17/2013   Procedure: CATARACT EXTRACTION EXTRACAPSULAR WITH INTRAOCULAR LENS PLACEMENT (IOC) RIGHT EYE;  Surgeon: Chalmers Guest, MD;  Location: [facility] OR;  Service: Ophthalmology;  Laterality: Right;   COLONOSCOPY     ESOPHAGOGASTRODUODENOSCOPY (EGD) WITH PROPOFOL N/A 08/12/2020   Procedure: ESOPHAGOGASTRODUODENOSCOPY (EGD) WITH PROPOFOL;  Surgeon: Charna Elizabeth, MD;  Location: [facility] ENDOSCOPY;  Service: Endoscopy;  Laterality: N/A;   HERNIA REPAIR     IR PARACENTESIS  09/24/2022   LOOP RECORDER INSERTION N/A 05/24/2019   Procedure: LOOP RECORDER INSERTION;  Surgeon: Regan Lemming, MD;  Location: [facility] INVASIVE CV LAB;  Service: Cardiovascular;  Laterality: N/A;   TEE WITHOUT  CARDIOVERSION N/A 05/24/2019   Procedure: TRANSESOPHAGEAL ECHOCARDIOGRAM (TEE);  Surgeon: Little Ishikawa, MD;  Location: [facility] ENDOSCOPY;  Service: Endoscopy;  Laterality: N/A;   VENTRAL HERNIA REPAIR  03/07/2012   Procedure: LAPAROSCOPIC VENTRAL HERNIA;  Surgeon: Cherylynn Ridges, MD;  Location: [facility] OR;  Service: General;  Laterality: N/A;     A IV Location/Drains/Wounds Patient Lines/Drains/Airways Status     Active Line/Drains/Airways     Name Placement date Placement time Site Days   Peripheral IV 11/20/22 20 G Left Antecubital 11/20/22  1616  Antecubital  less than 1   External Urinary Catheter 09/24/22  1600  --  57            Intake/Output Last 24 hours No intake or output data in the 24 hours ending 11/20/22 1940  Labs/Imaging Results for orders placed or performed during the hospital encounter of 11/20/22 (from the past 48 hour(s))  Lipase, blood     Status: None   Collection Time: 11/20/22  2:19 PM  Result Value Ref Range   Lipase 40 11 - 51 U/L    Comment: Performed at [facility], [address], [place], [place] [address]  Comprehensive metabolic panel     Status: Abnormal   Collection Time: 11/20/22  2:19 PM  Result Value Ref Range   Sodium 137 135 - 145 mmol/L   Potassium 5.1 3.5 - 5.1 mmol/L   Chloride 113 (H) 98 - 111 mmol/L   CO2 17 (L) 22 - 32 mmol/L   Glucose, Bld 189 (H) 70 - 99 mg/dL    Comment: Glucose reference range applies only to samples taken after fasting for at least 8 hours.   BUN 26 (H) 8 - 23 mg/dL   Creatinine, Ser 9.14 (H) 0.61 - 1.24 mg/dL   Calcium 8.2 (L) 8.9 - 10.3 mg/dL   Total Protein 6.9 6.5 - 8.1 g/dL   Albumin 2.8 (L) 3.5 - 5.0 g/dL   AST 29 15 - 41 U/L   ALT 21 0 - 44 U/L   Alkaline Phosphatase 112 38 - 126 U/L   Total Bilirubin 1.1 0.3 - 1.2 mg/dL   GFR, Estimated 42 (L) >60 mL/min    Comment: (NOTE) Calculated using the CKD-EPI Creatinine Equation (2021)    Anion gap 7 5 - 15    Comment: Performed at [facility], [address] [place], [place] [address]  CBC     Status: Abnormal   Collection Time: 11/20/22  2:19 PM  Result Value Ref Range   WBC 7.0 4.0 - 10.5 K/uL   RBC 3.57 (L) 4.22 - 5.81 MIL/uL   Hemoglobin 10.6 (L) 13.0 - 17.0 g/dL   HCT 62.1 (L) 30.8 - 65.7 %   MCV 94.7 80.0 - 100.0 fL   MCH 29.7 26.0 - 34.0 pg   MCHC 31.4 30.0 - 36.0 g/dL   RDW 84.6 96.2 - 95.2 %   Platelets  109 (L) 150 - 400 K/uL   nRBC 0.0 0.0 - 0.2 %    Comment: Performed at [facility], [address], [place], [place] [address]  Urinalysis, Routine w reflex microscopic -Urine, Clean Catch     Status: Abnormal   Collection Time: 11/20/22  4:06 PM  Result Value Ref Range   Color, Urine YELLOW YELLOW   APPearance CLEAR CLEAR   Specific Gravity, Urine 1.019 1.005 - 1.030   pH 5.0 5.0 - 8.0   Glucose, UA NEGATIVE NEGATIVE mg/dL   Hgb urine dipstick SMALL (A) NEGATIVE   Bilirubin Urine NEGATIVE NEGATIVE   Ketones, ur NEGATIVE NEGATIVE mg/dL   Protein, ur 878 (A) NEGATIVE mg/dL   Nitrite NEGATIVE NEGATIVE   Leukocytes,Ua NEGATIVE NEGATIVE   RBC / HPF 0-5 0 - 5 RBC/hpf   WBC, UA 0-5 0 - 5 WBC/hpf   Bacteria, UA FEW (A) NONE SEEN   Squamous Epithelial / HPF 0-5 0 - 5 /HPF   Mucus PRESENT     Comment: Performed at [facility], [address] [place], [place] [address]  Lactic acid, plasma     Status: Abnormal   Collection Time: 11/20/22  4:21 PM  Result Value Ref Range   Lactic Acid, Venous 3.3 (HH) 0.5 - 1.9 mmol/L    Comment: CRITICAL RESULT CALLED TO, READ BACK BY AND VERIFIED WITH Nicholes Rough, RN AT 1753 04.12.24 D. BLU Performed at [facility], [address], [place], [place] [address]   Protime-INR     Status: Abnormal   Collection Time: 11/20/22  4:26 PM  Result Value Ref Range   Prothrombin Time 16.9 (H) 11.4 - 15.2 seconds   INR 1.4 (H) 0.8 - 1.2    Comment: (NOTE) INR goal varies based on device and disease states. Performed at [facility], [address] [place], [place] [address]    CT Abdomen Pelvis W Contrast  Result Date: 11/20/2022 CLINICAL DATA:  Left lower quadrant abdominal pain. Nausea and vomiting. EXAM: CT ABDOMEN AND PELVIS WITH CONTRAST TECHNIQUE: Multidetector CT imaging of the abdomen and pelvis was performed using the standard protocol following bolus administration of intravenous contrast. RADIATION DOSE REDUCTION: This exam was performed according to the departmental dose-optimization program which includes automated exposure control, adjustment of the mA and/or kV according to patient size and/or use of iterative reconstruction technique. CONTRAST:  65mL OMNIPAQUE IOHEXOL 350 MG/ML SOLN COMPARISON:  02/23/2022 FINDINGS: Lower chest: Unremarkable. Hepatobiliary: Irregular liver contour suggests cirrhosis. No focal abnormality within the liver parenchyma. Layering sludge/stones noted in the gallbladder. No intrahepatic or extrahepatic biliary dilation. Pancreas: 6 mm hypodensity is identified in the pancreatic head on image 34/3. Pancreas otherwise unremarkable without main duct dilatation. Spleen: No splenomegaly. No focal mass lesion. Adrenals/Urinary Tract: No adrenal nodule or mass. 1-2 mm nonobstructing stone noted lower pole right kidney. No left renal stones. Bilateral renal cysts evident with additional tiny hypodensities in both kidneys too small to characterize but likely benign. No followup imaging is recommended. No evidence for hydroureter. 18 mm polypoid soft tissue lesion noted posterior bladder base, potentially median lobe hypertrophy of the prostate gland although urothelial lesion not excluded. Stomach/Bowel: Stomach is distended with food. Duodenum is normally positioned as is the ligament of Treitz. No small bowel wall thickening. No small bowel dilatation. The terminal ileum is normal. Wall thickening and mural edema noted in the right colon and transverse colon. Diverticular disease noted left colon without  diverticulitis. Vascular/Lymphatic: There is moderate atherosclerotic calcification of the abdominal aorta without aneurysm. There is no gastrohepatic or hepatoduodenal ligament lymphadenopathy. No retroperitoneal or mesenteric lymphadenopathy. Portal vein, superior mesenteric vein, and splenic vein are patent. Prominent esophageal and paraesophageal varices evident. No pelvic sidewall lymphadenopathy. Reproductive: Prostate gland is borderline enlarged. Other: Small to moderate volume ascites around the liver and spleen extending down into the pelvis. Musculoskeletal: Left groin hernia contains a short segment of small bowel and a moderate to large volume of fluid. The small bowel loop in the hernia sac is dilated up to 2.2 cm diameter. There is some mesenteric edema in the small bowel loops adjacent to the herniated segment although no substantial wall thickening or pneumatosis evident. Right groin hernia contains only fluid. IMPRESSION: 1. Left groin hernia contains a short segment of small bowel and a moderate to large volume of fluid. The small bowel loop in the hernia sac is dilated up to 2.2 cm diameter. There is some mesenteric edema in the small bowel loops adjacent to the herniated segment although no substantial wall thickening or pneumatosis evident. Small bowel proximal to the hernia does not appear substantially dilated although incarceration of the herniated segment cannot be excluded. 2. Wall thickening and mural edema in the right colon and transverse colon. Imaging features could be compatible with infectious/inflammatory colitis although given the apparent liver disease and ascites, this finding is nonspecific. 3. 18 mm polypoid soft tissue lesion posterior bladder base, potentially median lobe hypertrophy of the prostate gland although urothelial lesion not excluded. Urology consultation recommended. 4. 6 mm hypodensity in the pancreatic head. This is likely a cyst or pseudocyst. Given patient  age and lesion size, follow-up CT abdomen with contrast in 2 years recommended. This recommendation follows ACR consensus guidelines: Management of Incidental Pancreatic Cysts: A White Paper of the ACR Incidental Findings Committee. J Am Coll Radiol 2017;14:911-923. 5. Cirrhosis with small to moderate volume ascites and prominent esophageal and paraesophageal varices compatible with portal venous hypertension. 6. Cholelithiasis. 7. 1-2 mm nonobstructing stone lower pole right kidney. 8.  Aortic Atherosclerosis (ICD10-I70.0). Electronically Signed   By: Kennith Center M.D.   On: 11/20/2022 17:34    Pending Labs Unresulted Labs (From admission, onward)     Start     Ordered   11/21/22 0500  CBC  Tomorrow morning,   R        11/20/22 1939   11/21/22 0500  Basic metabolic panel  Tomorrow morning,   R        11/20/22 1939   11/20/22 1524  Lactic acid, plasma  Now then every 2 hours,   R (with STAT occurrences)      11/20/22 1523            Vitals/Pain Today's Vitals   11/20/22 1645 11/20/22 1659 11/20/22 1805 11/20/22 1900  BP: (!) 168/75  (!) 182/72 (!) 190/65  Pulse: (!) 50  (!) 35 (!) 46  Resp: SpO2: 97%  100% 99%  Weight:      Height:      PainSc:  5       Isolation Precautions No active isolations  Medications Medications  piperacillin-tazobactam (ZOSYN) IVPB 3.375 g (3.375 g Intravenous New Bag/Given 11/20/22 1922)  piperacillin-tazobactam (ZOSYN) IVPB 3.375 g (has no administration in time range)  HYDROmorphone (DILAUDID) injection 0.5 mg (has no administration in time range)  sodium chloride flush (NS) 0.9 % injection 3 mL (has no administration in time  range)  acetaminophen (TYLENOL) tablet 650 mg (has no administration in time range)    Or  acetaminophen (TYLENOL) suppository 650 mg (has no administration in time range)  ondansetron (ZOFRAN) tablet 4 mg (has no administration in time range)    Or  ondansetron (ZOFRAN) injection 4 mg (has no administration in  time range)  hydrALAZINE (APRESOLINE) injection 10 mg (has no administration in time range)  morphine (PF) 4 MG/ML injection 4 mg (4 mg Intravenous Given 11/20/22 1623)  ondansetron (ZOFRAN) injection 4 mg (4 mg Intravenous Given 11/20/22 1622)  iohexol (OMNIPAQUE) 350 MG/ML injection 60 mL (60 mLs Intravenous Contrast Given 11/20/22 1717)  diazepam (VALIUM) injection 2.5 mg (2.5 mg Intravenous Given 11/20/22 1803)    Mobility non-ambulatory     Focused Assessments Neuro Assessment Handoff:  Swallow screen pass? Yes          Neuro Assessment: Exceptions to WDL Neuro Checks:      Has TPA been given? No If patient is a Neuro Trauma and patient is going to OR before floor call report to 4N Charge nurse: [phone] or [phone]   R Recommendations: See Admitting Provider Note  Report given to:   Additional Notes: NPO.

## 2022-11-20 NOTE — Consult Note (Signed)
Reason for Consult:hernia Referring Provider: Virgina Norfolk  Trevor Murphy is an 88 y.o. male.  HPI: 88 yo male with known hernias presented with bilateral lower pain. The right has been reduced but the left side has been unable to be reduced. Pain started this morning. He denies vomiting.  He lives alone but a family member checks on him every day. He has a history of liver disease and underwent paracentesis in the past.  Past Medical History:  Diagnosis Date   Arthritis    CAD (coronary artery disease)    a. Cath 08/2009: minimal nonobstructive disease (25% LAD). b. Lexiscan nuc 6/215: EF 54%, no ischemia or infarction.   Chronic diastolic CHF (congestive heart failure)    a. Echo 2013: EF 55-60%, mild AI, mild MR. b. Echo 5/15 with EF 60-65%, no WMA, mild LVH, mild AI.   CKD (chronic kidney disease) stage 3, GFR 30-59 ml/min 07/26/2012   a. Felt likely due to HTN.   CVA (cerebral infarction) 07/28/2012   Glaucoma    Gout    H/O umbilical hernia repair    Headache(784.0)    " WHEN MY BLOOD PRESSURE IS UP"   History of acute renal failure    History of alcohol abuse    HTN (hypertension)    Hypertension    a. Patent renal arteries 2005.   Noncompliance    OA (osteoarthritis)    Other and unspecified hyperlipidemia    Poor circulation    Sinus bradycardia    SVT (supraventricular tachycardia)    a. Possible AVNRT.  Got adenosine from EMS in 2015 for SVT.  During 5/15 hospitalization had episode of short R-P tachycardia (possible AVNRT) terminated by adenosine.  b. 04/2016 - recurrent tachycardia (SVT vs. AVNRT), again terminated by Adenosine   Visual impairment     Past Surgical History:  Procedure Laterality Date   APPENDECTOMY     BIOPSY  08/12/2020   Procedure: BIOPSY;  Surgeon: Charna Elizabeth, MD;  Location: [facility] ENDOSCOPY;  Service: Endoscopy;;   BUBBLE STUDY  05/24/2019   Procedure: BUBBLE STUDY;  Surgeon: Little Ishikawa, MD;  Location: [facility] ENDOSCOPY;  Service:  Endoscopy;;   CARDIAC CATHETERIZATION  11/2003   EF 50-55%   CARDIAC CATHETERIZATION  08/2009   CATARACT EXTRACTION EXTRACAPSULAR Right 05/17/2013   Procedure: CATARACT EXTRACTION EXTRACAPSULAR WITH INTRAOCULAR LENS PLACEMENT (IOC) RIGHT EYE;  Surgeon: Chalmers Guest, MD;  Location: [facility] OR;  Service: Ophthalmology;  Laterality: Right;   COLONOSCOPY     ESOPHAGOGASTRODUODENOSCOPY (EGD) WITH PROPOFOL N/A 08/12/2020   Procedure: ESOPHAGOGASTRODUODENOSCOPY (EGD) WITH PROPOFOL;  Surgeon: Charna Elizabeth, MD;  Location: [facility] ENDOSCOPY;  Service: Endoscopy;  Laterality: N/A;   HERNIA REPAIR     IR PARACENTESIS  09/24/2022   LOOP RECORDER INSERTION N/A 05/24/2019   Procedure: LOOP RECORDER INSERTION;  Surgeon: Regan Lemming, MD;  Location: [facility] INVASIVE CV LAB;  Service: Cardiovascular;  Laterality: N/A;   TEE WITHOUT CARDIOVERSION N/A 05/24/2019   Procedure: TRANSESOPHAGEAL ECHOCARDIOGRAM (TEE);  Surgeon: Little Ishikawa, MD;  Location: [facility] ENDOSCOPY;  Service: Endoscopy;  Laterality: N/A;   VENTRAL HERNIA REPAIR  03/07/2012   Procedure: LAPAROSCOPIC VENTRAL HERNIA;  Surgeon: Cherylynn Ridges, MD;  Location: [facility] OR;  Service: General;  Laterality: N/A;    Family History  Problem Relation Age of Onset   CVA Mother    Hypertension Brother    Hypertension Sister    Hypertension Brother    Stroke Sister    Heart attack  Neg Hx     Social History:  reports that he has never smoked. He has never used smokeless tobacco. He reports that he does not drink alcohol and does not use drugs.  Allergies: No Known Allergies  Medications: I have reviewed the patient's current medications.  Results for orders placed or performed during the hospital encounter of 11/20/22 (from the past 48 hour(s))  Lipase, blood     Status: None   Collection Time: 11/20/22  2:19 PM  Result Value Ref Range   Lipase 40 11 - 51 U/L    Comment: Performed at [facility], [address] [place], [place] [address]   Comprehensive metabolic panel     Status: Abnormal   Collection Time: 11/20/22  2:19 PM  Result Value Ref Range   Sodium 137 135 - 145 mmol/L   Potassium 5.1 3.5 - 5.1 mmol/L   Chloride 113 (H) 98 - 111 mmol/L   CO2 17 (L) 22 - 32 mmol/L   Glucose, Bld 189 (H) 70 - 99 mg/dL    Comment: Glucose reference range applies only to samples taken after fasting for at least 8 hours.   BUN 26 (H) 8 - 23 mg/dL   Creatinine, Ser 6.04 (H) 0.61 - 1.24 mg/dL   Calcium 8.2 (L) 8.9 - 10.3 mg/dL   Total Protein 6.9 6.5 - 8.1 g/dL   Albumin 2.8 (L) 3.5 - 5.0 g/dL   AST 29 15 - 41 U/L   ALT 21 0 - 44 U/L   Alkaline Phosphatase 112 38 - 126 U/L   Total Bilirubin 1.1 0.3 - 1.2 mg/dL   GFR, Estimated 42 (L) >60 mL/min    Comment: (NOTE) Calculated using the CKD-EPI Creatinine Equation (2021)    Anion gap 7 5 - 15    Comment: Performed at [facility], [address], [place], [place] [address]  CBC     Status: Abnormal   Collection Time: 11/20/22  2:19 PM  Result Value Ref Range   WBC 7.0 4.0 - 10.5 K/uL   RBC 3.57 (L) 4.22 - 5.81 MIL/uL   Hemoglobin 10.6 (L) 13.0 - 17.0 g/dL   HCT 11.9 (L) 14.7 - 82.9 %   MCV 94.7 80.0 - 100.0 fL   MCH 29.7 26.0 - 34.0 pg   MCHC 31.4 30.0 - 36.0 g/dL   RDW 56.2 13.0 - 86.5 %   Platelets 109 (L) 150 - 400 K/uL   nRBC 0.0 0.0 - 0.2 %    Comment: Performed at [facility], [address], [place], [place] [address]  Urinalysis, Routine w reflex microscopic -Urine, Clean Catch     Status: Abnormal   Collection Time: 11/20/22  4:06 PM  Result Value Ref Range   Color, Urine YELLOW YELLOW   APPearance CLEAR CLEAR   Specific Gravity, Urine 1.019 1.005 - 1.030   pH 5.0 5.0 - 8.0   Glucose, UA NEGATIVE NEGATIVE mg/dL   Hgb urine dipstick SMALL (A) NEGATIVE   Bilirubin Urine NEGATIVE NEGATIVE   Ketones, ur NEGATIVE NEGATIVE mg/dL   Protein, ur 629 (A) NEGATIVE mg/dL   Nitrite NEGATIVE NEGATIVE   Leukocytes,Ua NEGATIVE NEGATIVE   RBC / HPF 0-5 0 - 5  RBC/hpf   WBC, UA 0-5 0 - 5 WBC/hpf   Bacteria, UA FEW (A) NONE SEEN   Squamous Epithelial / HPF 0-5 0 - 5 /HPF   Mucus PRESENT     Comment: Performed at [facility], [address] [place], [place] [address]  Lactic acid, plasma     Status: Abnormal   Collection Time: 11/20/22  4:21 PM  Result Value Ref Range   Lactic Acid, Venous 3.3 (HH) 0.5 - 1.9 mmol/L    Comment: CRITICAL RESULT CALLED TO, READ BACK BY AND VERIFIED WITH Nicholes Rough, RN AT 1753 04.12.24 D. BLU Performed at [facility], [address], [place], [place] [address]   Protime-INR     Status: Abnormal   Collection Time: 11/20/22  4:26 PM  Result Value Ref Range   Prothrombin Time 16.9 (H) 11.4 - 15.2 seconds   INR 1.4 (H) 0.8 - 1.2    Comment: (NOTE) INR goal varies based on device and disease states. Performed at [facility], [address], [place], [place] [address]     CT Abdomen Pelvis W Contrast  Result Date: 11/20/2022 CLINICAL DATA:  Left lower quadrant abdominal pain. Nausea and vomiting. EXAM: CT ABDOMEN AND PELVIS WITH CONTRAST TECHNIQUE: Multidetector CT imaging of the abdomen and pelvis was performed using the standard protocol following bolus administration of intravenous contrast. RADIATION DOSE REDUCTION: This exam was performed according to the departmental dose-optimization program which includes automated exposure control, adjustment of the mA and/or kV according to patient size and/or use of iterative reconstruction technique. CONTRAST:  14mL OMNIPAQUE IOHEXOL 350 MG/ML SOLN COMPARISON:  02/23/2022 FINDINGS: Lower chest: Unremarkable. Hepatobiliary: Irregular liver contour suggests cirrhosis. No focal abnormality within the liver parenchyma. Layering sludge/stones noted in the gallbladder. No intrahepatic or extrahepatic biliary dilation. Pancreas: 6 mm hypodensity is identified in the pancreatic head on image 34/3. Pancreas otherwise unremarkable without main duct dilatation. Spleen:  No splenomegaly. No focal mass lesion. Adrenals/Urinary Tract: No adrenal nodule or mass. 1-2 mm nonobstructing stone noted lower pole right kidney. No left renal stones. Bilateral renal cysts evident with additional tiny hypodensities in both kidneys too small to characterize but likely benign. No followup imaging is recommended. No evidence for hydroureter. 18 mm polypoid soft tissue lesion noted posterior bladder base, potentially median lobe hypertrophy of the prostate gland although urothelial lesion not excluded. Stomach/Bowel: Stomach is distended with food. Duodenum is normally positioned as is the ligament of Treitz. No small bowel wall thickening. No small bowel dilatation. The terminal ileum is normal. Wall thickening and mural edema noted in the right colon and transverse colon. Diverticular disease noted left colon without diverticulitis. Vascular/Lymphatic: There is moderate atherosclerotic calcification of the abdominal aorta without aneurysm. There is no gastrohepatic or hepatoduodenal ligament lymphadenopathy. No retroperitoneal or mesenteric lymphadenopathy. Portal vein, superior mesenteric vein, and splenic vein are patent. Prominent esophageal and paraesophageal varices evident. No pelvic sidewall lymphadenopathy. Reproductive: Prostate gland is borderline enlarged. Other: Small to moderate volume ascites around the liver and spleen extending down into the pelvis. Musculoskeletal: Left groin hernia contains a short segment of small bowel and a moderate to large volume of fluid. The small bowel loop in the hernia sac is dilated up to 2.2 cm diameter. There is some mesenteric edema in the small bowel loops adjacent to the herniated segment although no substantial wall thickening or pneumatosis evident. Right groin hernia contains only fluid. IMPRESSION: 1. Left groin hernia contains a short segment of small bowel and a moderate to large volume of fluid. The small bowel loop in the hernia sac is  dilated up to 2.2 cm diameter. There is some mesenteric edema in the small bowel loops adjacent to the herniated  segment although no substantial wall thickening or pneumatosis evident. Small bowel proximal to the hernia does not appear substantially dilated although incarceration of the herniated segment cannot be excluded. 2. Wall thickening and mural edema in the right colon and transverse colon. Imaging features could be compatible with infectious/inflammatory colitis although given the apparent liver disease and ascites, this finding is nonspecific. 3. 18 mm polypoid soft tissue lesion posterior bladder base, potentially median lobe hypertrophy of the prostate gland although urothelial lesion not excluded. Urology consultation recommended. 4. 6 mm hypodensity in the pancreatic head. This is likely a cyst or pseudocyst. Given patient age and lesion size, follow-up CT abdomen with contrast in 2 years recommended. This recommendation follows ACR consensus guidelines: Management of Incidental Pancreatic Cysts: A White Paper of the ACR Incidental Findings Committee. J Am Coll Radiol 2017;14:911-923. 5. Cirrhosis with small to moderate volume ascites and prominent esophageal and paraesophageal varices compatible with portal venous hypertension. 6. Cholelithiasis. 7. 1-2 mm nonobstructing stone lower pole right kidney. 8.  Aortic Atherosclerosis (ICD10-I70.0). Electronically Signed   By: Kennith Center M.D.   On: 11/20/2022 17:34    Review of Systems  Constitutional: Negative.   HENT: Negative.    Eyes: Negative.   Respiratory: Negative.    Cardiovascular: Negative.   Gastrointestinal:  Positive for abdominal pain.  Genitourinary: Negative.   Musculoskeletal: Negative.   Skin: Negative.   Neurological: Negative.   Endo/Heme/Allergies: Negative.   Psychiatric/Behavioral: Negative.      PE Blood pressure (!) 182/72, pulse (!) 35, resp. rate 19, height 5' 10.5" (1.791 m), weight 61.7 kg, SpO2 100  %. Constitutional: NAD; conversant; no deformities Eyes: Moist conjunctiva; no lid lag; anicteric; PERRL Neck: Trachea midline; no thyromegaly Lungs: Normal respiratory effort; no tactile fremitus CV: RRR; no palpable thrills; no pitting edema GI: Abd right inguinal hernia soft and reducible, left side unable to reduce the hernia; no palpable hepatosplenomegaly MSK: unable to assess gait; no clubbing/cyanosis Psychiatric: Appropriate affect; slow but alert and oriented x3 Lymphatic: No palpable cervical or axillary lymphadenopathy Skin: No major subcutaneous nodules. Warm and dry   Assessment/Plan: 88 yo male with cirrhosis (MELD 16), CHF, h/o CVA with incarcerated inguinal hernia -IV abx -admit to medicine service -OR tonight for left inguinal hernia reduction and repair. -I discussed the risks of surgery and nonoperative management. Risks of surgery include infection, bleeding, worsening heart or liver disease, injury to intestine, loss of independence, prolonged respiratory failure, pneumonia, or death.  I reviewed last 24 h vitals and pain scores, last 48 h intake and output, last 24 h labs and trends, and last 24 h imaging results.  This care required high  level of medical decision making.   De Blanch Shamara Soza 11/20/2022, 7:12 PM

## 2022-11-20 NOTE — ED Provider Notes (Signed)
Patient is here with inguinal hernia pain on the left.  Handed off to me at 4 PM.  Unable to reduce his left inguinal hernia both with myself and prior doctor.  He has a history of CHF, CAD, cirrhosis.  He is no longer on any anticoagulation.  States that he has noticed that the pain started more this morning.  Will check basic labs and he is awaiting CT scan.  Will consult general surgery.  Dr. Magnus Ivan with general surgery and I tried to reduce the hernia again at the bedside but unable to do so.  He will need to take the patient to the OR as patient with an incarcerated hernia.  His lactic acid 3.2.  Will start him on IV Zosyn.  He will need medicine team to manage postop and will call for admission for this service per surgery team request.  This chart was dictated using voice recognition software.  Despite best efforts to proofread,  errors can occur which can change the documentation meaning.    Virgina Norfolk, DO 11/20/22 6440

## 2022-11-20 NOTE — Op Note (Signed)
Preop diagnosis: left obstructing inguinal hernia  Postop diagnosis: left obstructing and gangrenous inguinal hernia  Procedure: open Left obstructing and gangrenous inguinal hernia repair, small bowel resection with anastomosis  Surgeon: Feliciana Rossetti, M.D.  Asst: none  Anesthesia: Gen.   Indications for procedure: Trevor Murphy is a 88 y.o. male with symptoms of pain and enlarging Left inguinal hernia(s). It was unable to be reduced in the ED. CT scan confirmed intestine obstructed within the hernia. After discussing risks, alternatives and benefits he decided on open repair and was brought to day surgery for repair.  Description of procedure: The patient was brought into the operative suite, placed supine. Anesthesia was administered with endotracheal tube. Patient was strapped in place. The patient was prepped and draped in the usual sterile fashion.  The anterior superior iliac spine and pubic tubercle were identified on the Left side. An incision was made 1cm above the connecting line, representative of the location of the inguinal ligament. The subcutaneous tissue was bluntly dissected, scarpa's fascia was dissected away. The external abdominal oblique fascia was identified and sharply opened down to the external inguinal ring. The conjoint tendon and inguinal ligament were identified. The external ring was opened to allow the hernia to reduce. The cord structures and sac were dissected free of the surrounding tissue in 360 degrees. A penrose drain was used to encircle the contents. The cremasteric fibers were dissected free of the contents of the cord and hernia sac. The cord structures (vessels and vas deferens) were identified and carefully dissected away from the hernia sac. The hernia sac was opened, moderate amount of ascites was removed. The small intestine just beyond the deep ring was pulled into the operating space. The mesentery was bruised. The serosa was purple in color. 5 minutes  were given and reassessed without improvement.   Therefore decision was made to proceed with small bowel resection. A 75 mm GIA stapler was used to divide the small intestine beyond the ischemic margin. The mesentery was taken between [facility] clamps and 3-0 silk stick ties used to ligation of vessels. Next, a side to side anastomosis was made with with 75 mm GIA. The enterotomy was closed with 60 mm blue load TA stapler. The stapler corners were imbricated with 3-0 silk. The mesenteric defect was closed with 3-0 silk.  Preperitoneal fat was identified showing appropriate dissection. The sac was closed with 3-0 silk. The sac was then reduced into the preperitoneal space. Due to contamination, no mesh was used. A Bassini repair was performed with 2-0 PDS suturing the conjoint tendon to the inguinal ligament in interrupted fashion. Next the external abdominal oblique fascia was closed with a 2-0 Vicryl in interrupted fashion to re-create the external inguinal ring. Exparel:Marcaine mix was injected into the muscle and surrounding area. Scarpa's fascia was closed with 3-0 Vicryl in running fashion. Skin was closed with a stapler. Bandage was placed for dressing. Patient woke from anesthesia and brought to PACU in stable condition. All counts are correct.  Findings: left obstructing and gangrene inguinal hernia, ischemia of small intestine  Specimen: none  Blood loss: 50 ml ml  Local anesthesia: 50 ml Marcaine/Exparel mix  Complications: none  Implant: none  Feliciana Rossetti, M.D. General, Bariatric, & Minimally Invasive Surgery [facility], [place] 10:17 PM 11/20/2022

## 2022-11-20 NOTE — Hospital Course (Signed)
Trevor Murphy is a 88 y.o. male with medical history significant for chronic diastolic CHF (EF 53-79%), CAD, CKD stage IIIb, PAF no longer on anticoagulation, bradycardia, cryptogenic stroke s/p ILR, hepatic cirrhosis with thrombocytopenia, severe mitral regurgitation who is admitted with incarcerated left inguinal hernia.

## 2022-11-20 NOTE — Progress Notes (Signed)
Pharmacy Antibiotic Note  Trevor Murphy is a 88 y.o. male admitted on 11/20/2022 presenting with abdominal pain, concern for intra-abdominal infection.  Pharmacy has been consulted for zosyn dosing.  Plan: Zosyn 3.375g IV every 8 hours (extended 4h infusion) Monitor renal function, clinical progression and LOT  Height: 5' 10.5" (179.1 cm) Weight: 61.7 kg (136 lb) IBW/kg (Calculated) : 74.15  No data recorded.  Recent Labs  Lab 11/20/22 1419 11/20/22 1621  WBC 7.0  --   CREATININE 1.60*  --   LATICACIDVEN  --  3.3*    Estimated Creatinine Clearance: 29.5 mL/min (A) (by C-G formula based on SCr of 1.6 mg/dL (H)).    No Known Allergies  Daylene Posey, PharmD, [facility] Clinical Pharmacist ED Pharmacist Phone # [phone] 11/20/2022 6:18 PM

## 2022-11-20 NOTE — Anesthesia Procedure Notes (Signed)
Procedure Name: Intubation Date/Time: 11/20/2022 9:04 PM  Performed by: Dairl Ponder, CRNAPre-anesthesia Checklist: Patient identified, Emergency Drugs available, Suction available and Patient being monitored Patient Re-evaluated:Patient Re-evaluated prior to induction Oxygen Delivery Method: Circle System Utilized Preoxygenation: Pre-oxygenation with 100% oxygen Induction Type: IV induction Ventilation: Mask ventilation without difficulty Laryngoscope Size: Mac and 3 Grade View: Grade I Tube type: Oral Tube size: 7.5 mm Number of attempts: 1 Airway Equipment and Method: Stylet and Oral airway Placement Confirmation: ETT inserted through vocal cords under direct vision, positive ETCO2 and breath sounds checked- equal and bilateral Secured at: 23 cm Tube secured with: Tape Dental Injury: Teeth and Oropharynx as per pre-operative assessment

## 2022-11-20 NOTE — ED Triage Notes (Signed)
Pt came in via pOV d/t lower ab pain tat began today & has been causing n/v, denies diarrhea. Reports he has a hernia that has been bothering him in his lower ab as well, A/Ox4, rate pain 10/10 while in triage.

## 2022-11-21 ENCOUNTER — Encounter (HOSPITAL_COMMUNITY): Payer: Self-pay | Admitting: General Surgery

## 2022-11-21 DIAGNOSIS — K4030 Unilateral inguinal hernia, with obstruction, without gangrene, not specified as recurrent: Secondary | ICD-10-CM | POA: Diagnosis not present

## 2022-11-21 LAB — CBC
HCT: 31.3 % — ABNORMAL LOW (ref 39.0–52.0)
Hemoglobin: 10 g/dL — ABNORMAL LOW (ref 13.0–17.0)
MCH: 29.8 pg (ref 26.0–34.0)
MCHC: 31.9 g/dL (ref 30.0–36.0)
MCV: 93.2 fL (ref 80.0–100.0)
Platelets: 75 10*3/uL — ABNORMAL LOW (ref 150–400)
RBC: 3.36 MIL/uL — ABNORMAL LOW (ref 4.22–5.81)
RDW: 15.3 % (ref 11.5–15.5)
WBC: 6.2 10*3/uL (ref 4.0–10.5)
nRBC: 0 % (ref 0.0–0.2)

## 2022-11-21 LAB — POTASSIUM
Potassium: 6.5 mmol/L (ref 3.5–5.1)
Potassium: 6.6 mmol/L (ref 3.5–5.1)
Potassium: 6.2 mmol/L — ABNORMAL HIGH (ref 3.5–5.1)
Potassium: 5.4 mmol/L — ABNORMAL HIGH (ref 3.5–5.1)

## 2022-11-21 LAB — BASIC METABOLIC PANEL
Anion gap: 9 (ref 5–15)
BUN: 26 mg/dL — ABNORMAL HIGH (ref 8–23)
CO2: 15 mmol/L — ABNORMAL LOW (ref 22–32)
Calcium: 8.1 mg/dL — ABNORMAL LOW (ref 8.9–10.3)
Chloride: 112 mmol/L — ABNORMAL HIGH (ref 98–111)
Creatinine, Ser: 1.51 mg/dL — ABNORMAL HIGH (ref 0.61–1.24)
GFR, Estimated: 45 mL/min — ABNORMAL LOW (ref >60)
Glucose, Bld: 159 mg/dL — ABNORMAL HIGH (ref 70–99)
Potassium: 6.4 mmol/L (ref 3.5–5.1)
Sodium: 136 mmol/L (ref 135–145)

## 2022-11-21 LAB — LACTIC ACID, PLASMA
Lactic Acid, Venous: 2.8 mmol/L (ref 0.5–1.9)
Lactic Acid, Venous: 4.8 mmol/L (ref 0.5–1.9)
Lactic Acid, Venous: 1.9 mmol/L (ref 0.5–1.9)

## 2022-11-21 MED ORDER — INSULIN ASPART 100 UNIT/ML IJ SOLN
5.0000 [IU] | Freq: Once | INTRAMUSCULAR | Status: AC
  Administered 2022-11-21: 5 [IU] via INTRAVENOUS

## 2022-11-21 MED ORDER — DEXTROSE 50 % IV SOLN
1.0000 | Freq: Once | INTRAVENOUS | Status: AC
  Administered 2022-11-21: 50 mL via INTRAVENOUS
  Filled 2022-11-21: qty 50

## 2022-11-21 MED ORDER — SODIUM BICARBONATE 8.4 % IV SOLN
50.0000 meq | Freq: Once | INTRAVENOUS | Status: AC
  Administered 2022-11-21: 50 meq via INTRAVENOUS
  Filled 2022-11-21: qty 50

## 2022-11-21 MED ORDER — INSULIN ASPART 100 UNIT/ML IV SOLN
10.0000 [IU] | Freq: Once | INTRAVENOUS | Status: AC
  Administered 2022-11-21: 10 [IU] via INTRAVENOUS

## 2022-11-21 MED ORDER — FINASTERIDE 5 MG PO TABS
5.0000 mg | ORAL_TABLET | Freq: Every day | ORAL | Status: DC
  Administered 2022-11-21 – 2022-11-27 (×7): 5 mg via ORAL
  Filled 2022-11-21 (×7): qty 1

## 2022-11-21 MED ORDER — SODIUM ZIRCONIUM CYCLOSILICATE 10 G PO PACK
10.0000 g | PACK | Freq: Once | ORAL | Status: AC
  Administered 2022-11-21: 10 g via ORAL
  Filled 2022-11-21: qty 1

## 2022-11-21 MED ORDER — CALCIUM GLUCONATE-NACL 1-0.675 GM/50ML-% IV SOLN
1.0000 g | Freq: Once | INTRAVENOUS | Status: AC
  Administered 2022-11-21: 1000 mg via INTRAVENOUS
  Filled 2022-11-21: qty 50

## 2022-11-21 MED ORDER — HYDRALAZINE HCL 20 MG/ML IJ SOLN
10.0000 mg | INTRAMUSCULAR | Status: DC | PRN
  Administered 2022-11-26: 10 mg via INTRAVENOUS
  Filled 2022-11-21: qty 1

## 2022-11-21 MED ORDER — SODIUM CHLORIDE 0.9 % IV SOLN: INTRAVENOUS | Status: DC

## 2022-11-21 MED ORDER — ENOXAPARIN SODIUM 30 MG/0.3ML IJ SOSY
30.0000 mg | PREFILLED_SYRINGE | INTRAMUSCULAR | Status: DC
  Administered 2022-11-21: 30 mg via SUBCUTANEOUS
  Filled 2022-11-21: qty 0.3

## 2022-11-21 NOTE — Progress Notes (Signed)
Subjective No acute events. Sleepy, arouses to voice. No n/v. Denies any pain at present.   Objective: Vital signs in last 24 hours: Temp:  [97 F (36.1 C)-97.9 F (36.6 C)] 97.5 F (36.4 C) (04/13 0754) Pulse Rate:  [35-58] 56 (04/13 0754) Resp:  [14-19] 14 (04/13 0754) BP: (117-190)/(50-106) 117/50 (04/13 0754) SpO2:  [97 %-100 %] 98 % (04/13 0754) Weight:  [59.5 kg-61.7 kg] 59.5 kg (04/13 0500)    Intake/Output from previous day: 04/12 0701 - 04/13 0700 In: 800 [I.V.:500; IV Piggyback:300] Out: 25 [Blood:25] Intake/Output this shift: No intake/output data recorded.  Gen: NAD, comfortable CV: RRR Pulm: Normal work of breathing Abd: Soft, NT/ND. Dressing in L groin dry. No palpable bulges. Ext: SCDs in place  Lab Results: CBC  Recent Labs    11/20/22 1419 11/21/22 0101  WBC 7.0 6.2  HGB 10.6* 10.0*  HCT 33.8* 31.3*  PLT 109* 75*   BMET Recent Labs    11/20/22 1419 11/21/22 0101 11/21/22 0405 11/21/22 0627  NA 137 136  --   --   K 5.1 6.4* 6.5* 6.6*  CL 113* 112*  --   --   CO2 17* 15*  --   --   GLUCOSE 189* 159*  --   --   BUN 26* 26*  --   --   CREATININE 1.60* 1.51*  --   --   CALCIUM 8.2* 8.1*  --   --    PT/INR Recent Labs    11/20/22 1626  LABPROT 16.9*  INR 1.4*   ABG No results for input(s): "PHART", "HCO3" in the last 72 hours.  Invalid input(s): "PCO2", "PO2"  Studies/Results:  Anti-infectives: Anti-infectives (From admission, onward)    Start     Dose/Rate Route Frequency Ordered Stop   11/21/22 0600  piperacillin-tazobactam (ZOSYN) IVPB 3.375 g        3.375 g 12.5 mL/hr over 240 Minutes Intravenous Every 8 hours 11/20/22 1818     11/20/22 1830  piperacillin-tazobactam (ZOSYN) IVPB 3.375 g        3.375 g 100 mL/hr over 30 Minutes Intravenous  Once 11/20/22 1817 11/20/22 1952        Assessment/Plan: Patient Active Problem List   Diagnosis Date Noted   Incarcerated left inguinal hernia 11/20/2022   Thrombocytopenia  11/20/2022   Severe mitral regurgitation 11/20/2022   Failure to thrive in adult 09/26/2022   Renal insufficiency 09/26/2022   Pancytopenia 09/24/2022   Weakness 09/23/2022   Lower leg edema 09/23/2022   Abdomen enlarged 09/23/2022   Other ascites 09/23/2022   Inguinal bulge 09/23/2022   Hiatal hernia 09/23/2022   Cirrhosis of liver with ascites 09/23/2022   Leg swelling 07/01/2022   Hematuria, gross 06/18/2022   Status post placement of implantable loop recorder 06/18/2022   History of CVA (cerebrovascular accident) 06/08/2022   Duodenal ulcer, unspecified as acute or chronic, without hemorrhage or perforation 01/09/2022   Iron deficiency anemia 01/09/2022   PVD (peripheral vascular disease) 08/19/2021   Mixed hyperlipidemia 08/12/2020   Acute blood loss anemia 08/11/2020   Paroxysmal atrial fibrillation 06/07/2020   Secondary hypercoagulable state 06/07/2020   Ischemic cerebrovascular accident (CVA) of frontal lobe 05/20/2019   Pain due to onychomycosis of toenails of both feet 01/20/2019   Prediabetes 07/22/2018   Nail abnormality 07/22/2018   Sinus bradycardia 05/02/2016   Hypotension due to drugs 05/02/2016   History of tachycardia 05/02/2016   GERD (gastroesophageal reflux disease) 02/05/2015   Coronary artery disease involving  native coronary artery of native heart without angina pectoris 04/20/2014   Atypical chest pain 04/12/2014   Hypertensive urgency 03/11/2014   Chronic diastolic CHF (congestive heart failure) 01/30/2014   Alcohol abuse 01/19/2014   Elevated troponin level not due myocardial infarction 12/19/2013   Gout 12/19/2013   Right knee pain 12/19/2013   Dyslipidemia 12/16/2013   Hypertensive crisis 12/16/2013   Cerebral infarction (HCC) 07/28/2012   Acute on chronic renal failure 07/27/2012   Essential hypertension 07/26/2012   Chronic kidney disease, stage 3b 07/26/2012   Poor circulation    Recurrent ventral hernia 02/16/2012   Hernia (acquired)  (recurrent) 02/10/2011   s/p Procedure(s): OPEN LEFT INGUINAL HERNIA REPAIR FOR GANGRENE AND SMALL BOWEL RESECTION WITH STINASTIMOSIST 11/20/2022  -Sips of liquids later today if doing well -PT/OT -Ok for chemical dvt prophylaxis from our perspective   LOS: 1 day   Check [email or web address] for General Surgery coverage night/weekend/holidays  No secure chat available for me given surgeries/clinic/off post call which would lead to a delay in care.    Marin Olp, MD [facility], A [facility]

## 2022-11-21 NOTE — Progress Notes (Addendum)
PROGRESS NOTE    Trevor Murphy  WLN:[id number] DOB: 12-May-1935 DOA: 11/20/2022 PCP: [name], FNP   Brief Narrative:  HPI: Trevor Murphy is a 88 y.o. male with medical history significant for chronic diastolic CHF (EF 74-08%), CAD, CKD stage IIIb, PAF no longer on anticoagulation, bradycardia, cryptogenic stroke s/p ILR, hepatic cirrhosis with thrombocytopenia, severe mitral regurgitation who presented to the ED for evaluation of abdominal pain with nausea and vomiting.   Patient reports acute onset of left lower abdominal pain beginning this morning.  He has had nausea and vomiting.  He reports known chronic bilateral inguinal hernias.  He denies diarrhea, constipation, dysuria, fevers, chills.  He lives at home alone but has family that checks in on him.  He uses a wheelchair outside of the house.   Recently hospitalized 09/23/2022-09/28/2022 with decompensated hepatic cirrhosis with ascites.  Required paracentesis with 2.1 L fluid removed.  Started on spironolactone.  He had severe thrombocytopenia and was considered high risk for bleeding therefore after shared decision making Eliquis was discontinued.   ED Course  Labs/Imaging on admission: I have personally reviewed following labs and imaging studies.   Initial vitals showed BP 184/61, pulse 53, RR 16, temperature not documented, SpO2 100% on room air.   Labs show WBC 7.0, hemoglobin 10.6, platelets 109,000, sodium 137, potassium 5.1, bicarb 17, BUN 26, creatinine 1.60, serum glucose 189, LFTs within normal limits, lactic acid 3.3, lipase 40.   CT abdomen/pelvis with contrast shows left groin hernia containing short segment of small bowel and moderate to large volume of fluid.  Small bowel loops within the hernia sac is dilated.  Wall thickening and mural edema in the right colon and transverse colon noted.  18 mm polypoid soft tissue lesion posterior bladder base also seen.  6 mm hypodensity in the pancreatic head reported.  Cirrhosis  with small to moderate volume ascites and prominent esophageal and paraesophageal varices, cholelithiasis, 1-2 mm nonobstructing stone lower pole right kidney also noted.   Patient was given IV morphine 4 mg, IV Valium 2.5 mg, and IV Zosyn.  General surgery consulted and have evaluated the patient.  Plan is for OR tonight for left inguinal hernia reduction and repair.  Due to multiple chronic medical comorbidities the hospitalist service was consulted to admit for further evaluation and management.  Assessment & Plan:   Principal Problem:   Incarcerated left inguinal hernia Active Problems:   Cirrhosis of liver with ascites   Chronic kidney disease, stage 3b   Paroxysmal atrial fibrillation   Chronic diastolic CHF (congestive heart failure)   Essential hypertension   Sinus bradycardia   History of CVA (cerebrovascular accident)   Thrombocytopenia   Severe mitral regurgitation  Incarcerated left inguinal hernia: S/p open left inguinal hernia repair 11/20/2022 by Dr. Sheliah Hatch.  Doing well.  Discussed with general surgery/Dr. Cliffton Asters.  No indication of antibiotics, will discontinue Zosyn.   Paroxysmal atrial fibrillation with sinus bradycardia: Remains in sinus bradycardia since admission but also asymptomatic.  Not on any rate control medications.  Monitor.   He is off anticoagulation after prior shared decision making on recent admission.  General surgery has cleared him for chemical DVT prophylaxis, will start him on Lovenox.   Chronic diastolic CHF/severe mitral regurgitation: EF 55-60%.  Does not appear to be in decompensated CHF.  He is on spironolactone and [name] as an outpatient, currently on hold and will continue to hold since he is hyperkalemic.  Hyperkalemia: 6.4 earlier today, received 5 units  of IV insulin and dextrose.  Repeat potassium 6.6.  Will give him another 5 units of insulin and Lokelma.  Rechecking at noon and continue to monitor on telemetry.  Patient asymptomatic.    History of cryptogenic stroke: Has ILR in place, recent review shows episodes of bradycardia as an outpatient.  Off of anticoagulation as above.   Hepatic cirrhosis with ascites: Small to moderate ascites noted on CT imaging.  He is fully alert and oriented.  No paracentesis indicated.   CKD stage IIIb: Renal function is stable.  Continue to monitor.   Essential hypertension: BP elevated on admission.  Spironolactone on hold as above.  Resume finasteride.  Continue IV hydralazine as needed.   Chronic thrombocytopenia: Stable. No indication for transfusion.   Bladder lesion: 18 mm polypoid soft tissue lesion posterior bladder base noted on CT imaging.  Potentially median lobe hypertrophy of the prostate gland although urothelial lesion not excluded per radiology read.  Findings discussed with patient's son.  Consider outpatient urology follow-up pending further goals of care.   Pancreatic head lesion: 6 mm hypodensity in pancreatic head noted on CT imaging.  This is felt to be a cyst or pseudocyst per radiology read.  Consider follow-up CT abdomen with contrast in 2 years pending goals of care.  DVT prophylaxis: SCDs Start: 11/20/22 1938   Code Status: DNR  Family Communication:  None present at bedside.  Plan of care discussed with patient in length and he/she verbalized understanding and agreed with it.  Status is: Inpatient Remains inpatient appropriate because: Postoperative.  PT OT evaluation pending.   Estimated body mass index is 18.56 kg/m as calculated from the following:   Height as of this encounter: 5' 10.5" (1.791 m).   Weight as of this encounter: 59.5 kg.    Nutritional Assessment: Body mass index is 18.56 kg/m.[name] Kitchen Seen by dietician.  I agree with the assessment and plan as outlined below: Nutrition Status:        . Skin Assessment: I have examined the patient's skin and I agree with the wound assessment as performed by the wound care RN as outlined  below:    Consultants:  Surgery  Procedures:  As above  Antimicrobials:  Anti-infectives (From admission, onward)    Start     Dose/Rate Route Frequency Ordered Stop   11/21/22 0600  piperacillin-tazobactam (ZOSYN) IVPB 3.375 g        3.375 g 12.5 mL/hr over 240 Minutes Intravenous Every 8 hours 11/20/22 1818     11/20/22 1830  piperacillin-tazobactam (ZOSYN) IVPB 3.375 g        3.375 g 100 mL/hr over 30 Minutes Intravenous  Once 11/20/22 1817 11/20/22 1952         Subjective: Seen and examined.  He is feeling well.  No pain no complaint.  Objective: Vitals:   11/20/22 2313 11/21/22 0340 11/21/22 0500 11/21/22 0754  BP: (!) 153/57 (!) 146/63  (!) 117/50  Pulse: (!) 54 (!) 58  (!) 56  Resp: Temp:  97.9 F (36.6 C)  (!) 97.5 F (36.4 C)  TempSrc:  Oral  Axillary  SpO2: 97% 100%  98%  Weight:   59.5 kg   Height:        Intake/Output Summary (Last 24 hours) at 11/21/2022 1023 Last data filed at 11/20/2022 2224 Gross per 24 hour  Intake 800 ml  Output 25 ml  Net 775 ml   Filed Weights   11/20/22 1406 11/21/22  0500  Weight: 61.7 kg 59.5 kg    Examination:  General exam: Appears calm and comfortable, although slightly sleepy. Respiratory system: Clear to auscultation. Respiratory effort normal. Cardiovascular system: S1 & S2 heard, RRR. No JVD, murmurs, rubs, gallops or clicks. No pedal edema. Gastrointestinal system: Abdomen is nondistended, soft and nontender. No organomegaly or masses felt. Normal bowel sounds heard. Central nervous system: Alert and oriented. No focal neurological deficits. Extremities: Symmetric 5 x 5 power. Skin: No rashes, lesions or ulcers Psychiatry: Judgement and insight appear normal. Mood & affect appropriate.    Data Reviewed: I have personally reviewed following labs and imaging studies  CBC: Recent Labs  Lab 11/20/22 1419 11/21/22 0101  WBC 7.0 6.2  HGB 10.6* 10.0*  HCT 33.8* 31.3*  MCV 94.7 93.2  PLT 109*  75*   Basic Metabolic Panel: Recent Labs  Lab 11/20/22 1419 11/21/22 0101 11/21/22 0405 11/21/22 0627  NA 137 136  --   --   K 5.1 6.4* 6.5* 6.6*  CL 113* 112*  --   --   CO2 17* 15*  --   --   GLUCOSE 189* 159*  --   --   BUN 26* 26*  --   --   CREATININE 1.60* 1.51*  --   --   CALCIUM 8.2* 8.1*  --   --    GFR: Estimated Creatinine Clearance: 30.1 mL/min (A) (by C-G formula based on SCr of 1.51 mg/dL (H)). Liver Function Tests: Recent Labs  Lab 11/20/22 1419  AST 29  ALT 21  ALKPHOS 112  BILITOT 1.1  PROT 6.9  ALBUMIN 2.8*   Recent Labs  Lab 11/20/22 1419  LIPASE 40   No results for input(s): "AMMONIA" in the last 168 hours. Coagulation Profile: Recent Labs  Lab 11/20/22 1626  INR 1.4*   Cardiac Enzymes: No results for input(s): "CKTOTAL", "CKMB", "CKMBINDEX", "TROPONINI" in the last 168 hours. BNP (last 3 results) No results for input(s): "PROBNP" in the last 8760 hours. HbA1C: No results for input(s): "HGBA1C" in the last 72 hours. CBG: No results for input(s): "GLUCAP" in the last 168 hours. Lipid Profile: No results for input(s): "CHOL", "HDL", "LDLCALC", "TRIG", "CHOLHDL", "LDLDIRECT" in the last 72 hours. Thyroid Function Tests: No results for input(s): "TSH", "T4TOTAL", "FREET4", "T3FREE", "THYROIDAB" in the last 72 hours. Anemia Panel: No results for input(s): "VITAMINB12", "FOLATE", "FERRITIN", "TIBC", "IRON", "RETICCTPCT" in the last 72 hours. Sepsis Labs: Recent Labs  Lab 11/20/22 1621 11/21/22 0101  LATICACIDVEN 3.3* 2.8*    No results found for this or any previous visit (from the past 240 hour(s)).   Radiology Studies: CT Abdomen Pelvis W Contrast  Result Date: 11/20/2022 CLINICAL DATA:  Left lower quadrant abdominal pain. Nausea and vomiting. EXAM: CT ABDOMEN AND PELVIS WITH CONTRAST TECHNIQUE: Multidetector CT imaging of the abdomen and pelvis was performed using the standard protocol following bolus administration of intravenous  contrast. RADIATION DOSE REDUCTION: This exam was performed according to the departmental dose-optimization program which includes automated exposure control, adjustment of the mA and/or kV according to patient size and/or use of iterative reconstruction technique. CONTRAST:  60mL OMNIPAQUE IOHEXOL 350 MG/ML SOLN COMPARISON:  02/23/2022 FINDINGS: Lower chest: Unremarkable. Hepatobiliary: Irregular liver contour suggests cirrhosis. No focal abnormality within the liver parenchyma. Layering sludge/stones noted in the gallbladder. No intrahepatic or extrahepatic biliary dilation. Pancreas: 6 mm hypodensity is identified in the pancreatic head on image 34/3. Pancreas otherwise unremarkable without main duct dilatation. Spleen: No splenomegaly. No focal  mass lesion. Adrenals/Urinary Tract: No adrenal nodule or mass. 1-2 mm nonobstructing stone noted lower pole right kidney. No left renal stones. Bilateral renal cysts evident with additional tiny hypodensities in both kidneys too small to characterize but likely benign. No followup imaging is recommended. No evidence for hydroureter. 18 mm polypoid soft tissue lesion noted posterior bladder base, potentially median lobe hypertrophy of the prostate gland although urothelial lesion not excluded. Stomach/Bowel: Stomach is distended with food. Duodenum is normally positioned as is the ligament of Treitz. No small bowel wall thickening. No small bowel dilatation. The terminal ileum is normal. Wall thickening and mural edema noted in the right colon and transverse colon. Diverticular disease noted left colon without diverticulitis. Vascular/Lymphatic: There is moderate atherosclerotic calcification of the abdominal aorta without aneurysm. There is no gastrohepatic or hepatoduodenal ligament lymphadenopathy. No retroperitoneal or mesenteric lymphadenopathy. Portal vein, superior mesenteric vein, and splenic vein are patent. Prominent esophageal and paraesophageal varices  evident. No pelvic sidewall lymphadenopathy. Reproductive: Prostate gland is borderline enlarged. Other: Small to moderate volume ascites around the liver and spleen extending down into the pelvis. Musculoskeletal: Left groin hernia contains a short segment of small bowel and a moderate to large volume of fluid. The small bowel loop in the hernia sac is dilated up to 2.2 cm diameter. There is some mesenteric edema in the small bowel loops adjacent to the herniated segment although no substantial wall thickening or pneumatosis evident. Right groin hernia contains only fluid. IMPRESSION: 1. Left groin hernia contains a short segment of small bowel and a moderate to large volume of fluid. The small bowel loop in the hernia sac is dilated up to 2.2 cm diameter. There is some mesenteric edema in the small bowel loops adjacent to the herniated segment although no substantial wall thickening or pneumatosis evident. Small bowel proximal to the hernia does not appear substantially dilated although incarceration of the herniated segment cannot be excluded. 2. Wall thickening and mural edema in the right colon and transverse colon. Imaging features could be compatible with infectious/inflammatory colitis although given the apparent liver disease and ascites, this finding is nonspecific. 3. 18 mm polypoid soft tissue lesion posterior bladder base, potentially median lobe hypertrophy of the prostate gland although urothelial lesion not excluded. Urology consultation recommended. 4. 6 mm hypodensity in the pancreatic head. This is likely a cyst or pseudocyst. Given patient age and lesion size, follow-up CT abdomen with contrast in 2 years recommended. This recommendation follows ACR consensus guidelines: Management of Incidental Pancreatic Cysts: A White Paper of the ACR Incidental Findings Committee. J Am Coll Radiol 2017;14:911-923. 5. Cirrhosis with small to moderate volume ascites and prominent esophageal and paraesophageal  varices compatible with portal venous hypertension. 6. Cholelithiasis. 7. 1-2 mm nonobstructing stone lower pole right kidney. 8.  Aortic Atherosclerosis (ICD10-I70.0). Electronically Signed   By: Kennith Center M.D.   On: 11/20/2022 17:34    Scheduled Meds:  finasteride  5 mg Oral Daily   sodium chloride flush  3 mL Intravenous Q12H   Continuous Infusions:  piperacillin-tazobactam (ZOSYN)  IV 3.375 g (11/21/22 0616)     LOS: 1 day   Hughie Closs, MD [facility]  11/21/2022, 10:23 AM   *Please note that this is a verbal dictation therefore any spelling or grammatical errors are due to the "Dragon [employer] One" system interpretation.  Please page via Amion and do not message via secure chat for urgent patient care matters. Secure chat can be used for non urgent patient care  matters.  How to contact the [facility] Attending or Consulting provider [place] or covering provider during after hours [place], for this patient?  Check the care team in [facility] and look for a) attending/consulting [facility] provider listed and b) the [facility] team listed. Page or secure chat [phone]. Log into [email or web address] and use [employer]'s universal password to access. If you do not have the password, please contact the hospital operator. Locate the [facility] provider you are looking for under [facility] and page to a number that you can be directly reached. If you still have difficulty reaching the provider, please page the [facility] (Director on Call) for the Hospitalists listed on amion for assistance.

## 2022-11-21 NOTE — Progress Notes (Signed)
PT Cancellation Note  Patient Details Name: Trevor Murphy MRN: [id number] DOB: 23-Jul-1935   Cancelled Treatment:    Reason Eval/Treat Not Completed: Fatigue/lethargy limiting ability to participate. Pt sleeping soundly. Unable to rouse. PT to re-attempt tomorrow.   Ilda Foil 11/21/2022, 2:05 PM

## 2022-11-21 NOTE — Progress Notes (Signed)
ANTICOAGULATION CONSULT NOTE - Initial Consult  Pharmacy Consult for Lovenox  Indication: VTE prophylaxis  No Known Allergies  Patient Measurements: Height: 5' 10.5" (179.1 cm) Weight: 59.5 kg (131 lb 2.8 oz) IBW/kg (Calculated) : 74.15   Vital Signs: Temp: 97.5 F (36.4 C) (04/13 0754) Temp Source: Axillary (04/13 0754) BP: 117/50 (04/13 0754) Pulse Rate: 56 (04/13 0754)  Labs: Recent Labs    11/20/22 1419 11/20/22 1626 11/21/22 0101  HGB 10.6*  --  10.0*  HCT 33.8*  --  31.3*  PLT 109*  --  75*  LABPROT  --  16.9*  --   INR  --  1.4*  --   CREATININE 1.60*  --  1.51*    Estimated Creatinine Clearance: 30.1 mL/min (A) (by C-G formula based on SCr of 1.51 mg/dL (H)).   Medical History: Past Medical History:  Diagnosis Date   Arthritis    CAD (coronary artery disease)    a. Cath 08/2009: minimal nonobstructive disease (25% LAD). b. Lexiscan nuc 6/215: EF 54%, no ischemia or infarction.   Chronic diastolic CHF (congestive heart failure)    a. Echo 2013: EF 55-60%, mild AI, mild MR. b. Echo 5/15 with EF 60-65%, no WMA, mild LVH, mild AI.   CKD (chronic kidney disease) stage 3, GFR 30-59 ml/min 07/26/2012   a. Felt likely due to HTN.   CVA (cerebral infarction) 07/28/2012   Glaucoma    Gout    H/O umbilical hernia repair    Headache(784.0)    " WHEN MY BLOOD PRESSURE IS UP"   History of acute renal failure    History of alcohol abuse    HTN (hypertension)    Hypertension    a. Patent renal arteries 2005.   Noncompliance    OA (osteoarthritis)    Other and unspecified hyperlipidemia    Poor circulation    Sinus bradycardia    SVT (supraventricular tachycardia)    a. Possible AVNRT.  Got adenosine from EMS in 2015 for SVT.  During 5/15 hospitalization had episode of short R-P tachycardia (possible AVNRT) terminated by adenosine.  b. 04/2016 - recurrent tachycardia (SVT vs. AVNRT), again terminated by Adenosine   Visual impairment     Medications:   Medications Prior to Admission  Medication Sig Dispense Refill Last Dose   acetaminophen (TYLENOL) 500 MG tablet Take 1,000 mg by mouth 2 (two) times daily as needed for mild pain or moderate pain (leg pain).      dapagliflozin propanediol ([name]) 10 MG TABS tablet Take 1 tablet (10 mg total) by mouth daily before breakfast. 30 tablet 2    ferrous sulfate (FEROSUL) 325 (65 FE) MG tablet TAKE 1 TABLET(325 MG) BY MOUTH DAILY 90 tablet 1    finasteride (PROSCAR) 5 MG tablet Take 5 mg by mouth daily.      LUMIGAN 0.01 % SOLN Place 1 drop into both eyes at bedtime.      SIMBRINZA 1-0.2 % SUSP Follow up with your PCP on when to restart this medication. (Patient taking differently: Place 1 drop into both eyes 3 (three) times daily.)      spironolactone (ALDACTONE) 50 MG tablet Take 1 tablet (50 mg total) by mouth daily. 30 tablet 3    Scheduled:   finasteride  5 mg Oral Daily   sodium chloride flush  3 mL Intravenous Q12H    Assessment: 88 YO male admitted 4/12 with left lower abdominal pain. Pt underwent open left obstructing and gangrenous inguinal hernia repair  4/12 PM. General surgery okay with chemical VTE prophylaxis. Pharmacy consulted for Lovenox dosing.   Hgb 10--stable. Plts 109>>75 from yesterday; however likely due to procedure and plts are chronically low at baseline. Will cautiously dose Lovenox for VTE prophylaxis given low plts and borderline CrCl <30.   Goal of Therapy:  Monitor platelets by anticoagulation protocol: Yes   Plan:  Start Lovenox  SQ daily  Monitor CBC and s/sx of bleeding   Cherylin Mylar, PharmD PGY1 Pharmacy Resident 4/13/202411:05 AM

## 2022-11-21 NOTE — Progress Notes (Signed)
Critical lab of potassium 6.4 called on the patient. MD paged to notify. Awaiting response.

## 2022-11-21 NOTE — Progress Notes (Signed)
Critical lab of potassium 6.5 called on this patient, MD paged to notify. Awaiting response.

## 2022-11-22 ENCOUNTER — Inpatient Hospital Stay (HOSPITAL_COMMUNITY): Payer: 59

## 2022-11-22 DIAGNOSIS — N183 Chronic kidney disease, stage 3 unspecified: Secondary | ICD-10-CM | POA: Diagnosis not present

## 2022-11-22 DIAGNOSIS — K921 Melena: Secondary | ICD-10-CM

## 2022-11-22 DIAGNOSIS — I5030 Unspecified diastolic (congestive) heart failure: Secondary | ICD-10-CM

## 2022-11-22 DIAGNOSIS — D62 Acute posthemorrhagic anemia: Secondary | ICD-10-CM

## 2022-11-22 DIAGNOSIS — K4030 Unilateral inguinal hernia, with obstruction, without gangrene, not specified as recurrent: Secondary | ICD-10-CM | POA: Diagnosis not present

## 2022-11-22 DIAGNOSIS — I4891 Unspecified atrial fibrillation: Secondary | ICD-10-CM

## 2022-11-22 DIAGNOSIS — N329 Bladder disorder, unspecified: Secondary | ICD-10-CM

## 2022-11-22 LAB — CBC WITH DIFFERENTIAL/PLATELET
Abs Immature Granulocytes: 0.02 10*3/uL (ref 0.00–0.07)
Abs Immature Granulocytes: 0.02 10*3/uL (ref 0.00–0.07)
Basophils Absolute: 0 10*3/uL (ref 0.0–0.1)
Basophils Absolute: 0 10*3/uL (ref 0.0–0.1)
Basophils Relative: 0 %
Basophils Relative: 0 %
Eosinophils Absolute: 0 10*3/uL (ref 0.0–0.5)
Eosinophils Absolute: 0.2 10*3/uL (ref 0.0–0.5)
Eosinophils Relative: 0 %
Eosinophils Relative: 2 %
HCT: 18.2 % — ABNORMAL LOW (ref 39.0–52.0)
HCT: 26 % — ABNORMAL LOW (ref 39.0–52.0)
Hemoglobin: 5.9 g/dL — CL (ref 13.0–17.0)
Hemoglobin: 8.7 g/dL — ABNORMAL LOW (ref 13.0–17.0)
Immature Granulocytes: 0 %
Immature Granulocytes: 0 %
Lymphocytes Relative: 7 %
Lymphocytes Relative: 7 %
Lymphs Abs: 0.6 10*3/uL — ABNORMAL LOW (ref 0.7–4.0)
Lymphs Abs: 0.4 10*3/uL — ABNORMAL LOW (ref 0.7–4.0)
MCH: 30.3 pg (ref 26.0–34.0)
MCH: 29.6 pg (ref 26.0–34.0)
MCHC: 32.4 g/dL (ref 30.0–36.0)
MCHC: 33.5 g/dL (ref 30.0–36.0)
MCV: 93.3 fL (ref 80.0–100.0)
MCV: 88.4 fL (ref 80.0–100.0)
Monocytes Absolute: 1 10*3/uL (ref 0.1–1.0)
Monocytes Absolute: 0.7 10*3/uL (ref 0.1–1.0)
Monocytes Relative: 12 %
Monocytes Relative: 11 %
Neutro Abs: 6.7 10*3/uL (ref 1.7–7.7)
Neutro Abs: 5.2 10*3/uL (ref 1.7–7.7)
Neutrophils Relative %: 81 %
Neutrophils Relative %: 80 %
Platelets: 56 10*3/uL — ABNORMAL LOW (ref 150–400)
Platelets: 44 10*3/uL — ABNORMAL LOW (ref 150–400)
RBC: 1.95 MIL/uL — ABNORMAL LOW (ref 4.22–5.81)
RBC: 2.94 MIL/uL — ABNORMAL LOW (ref 4.22–5.81)
RDW: 15.4 % (ref 11.5–15.5)
RDW: 17.2 % — ABNORMAL HIGH (ref 11.5–15.5)
WBC: 8.3 10*3/uL (ref 4.0–10.5)
WBC: 6.6 10*3/uL (ref 4.0–10.5)
nRBC: 0 % (ref 0.0–0.2)
nRBC: 0 % (ref 0.0–0.2)

## 2022-11-22 LAB — HEMOGLOBIN: Hemoglobin: 8.2 g/dL — ABNORMAL LOW (ref 13.0–17.0)

## 2022-11-22 LAB — BASIC METABOLIC PANEL
Anion gap: 7 (ref 5–15)
BUN: 38 mg/dL — ABNORMAL HIGH (ref 8–23)
CO2: 15 mmol/L — ABNORMAL LOW (ref 22–32)
Calcium: 7.3 mg/dL — ABNORMAL LOW (ref 8.9–10.3)
Chloride: 112 mmol/L — ABNORMAL HIGH (ref 98–111)
Creatinine, Ser: 1.81 mg/dL — ABNORMAL HIGH (ref 0.61–1.24)
GFR, Estimated: 36 mL/min — ABNORMAL LOW (ref >60)
Glucose, Bld: 100 mg/dL — ABNORMAL HIGH (ref 70–99)
Potassium: 6.2 mmol/L — ABNORMAL HIGH (ref 3.5–5.1)
Sodium: 134 mmol/L — ABNORMAL LOW (ref 135–145)

## 2022-11-22 LAB — BPAM RBC: Blood Product Expiration Date: 202404222359

## 2022-11-22 LAB — HEMOGLOBIN AND HEMATOCRIT, BLOOD
HCT: 17.3 % — ABNORMAL LOW (ref 39.0–52.0)
HCT: 25.4 % — ABNORMAL LOW (ref 39.0–52.0)
Hemoglobin: 5.7 g/dL — CL (ref 13.0–17.0)
Hemoglobin: 8.3 g/dL — ABNORMAL LOW (ref 13.0–17.0)

## 2022-11-22 LAB — PREPARE RBC (CROSSMATCH)

## 2022-11-22 LAB — PROTIME-INR
INR: 1.3 — ABNORMAL HIGH (ref 0.8–1.2)
Prothrombin Time: 16.4 seconds — ABNORMAL HIGH (ref 11.4–15.2)

## 2022-11-22 LAB — TYPE AND SCREEN

## 2022-11-22 LAB — POTASSIUM: Potassium: 5.4 mmol/L — ABNORMAL HIGH (ref 3.5–5.1)

## 2022-11-22 MED ORDER — BRINZOLAMIDE 1 % OP SUSP
1.0000 [drp] | Freq: Three times a day (TID) | OPHTHALMIC | Status: DC
  Administered 2022-11-22 – 2022-11-25 (×12): 1 [drp] via OPHTHALMIC
  Filled 2022-11-22: qty 10

## 2022-11-22 MED ORDER — SODIUM CHLORIDE 0.9% IV SOLUTION: Freq: Once | INTRAVENOUS | Status: AC

## 2022-11-22 MED ORDER — DEXTROSE 50 % IV SOLN
1.0000 | Freq: Once | INTRAVENOUS | Status: AC
  Administered 2022-11-22: 50 mL via INTRAVENOUS
  Filled 2022-11-22: qty 50

## 2022-11-22 MED ORDER — PEG-KCL-NACL-NASULF-NA ASC-C 100 G PO SOLR: 1.0000 | Freq: Once | ORAL | Status: DC

## 2022-11-22 MED ORDER — BRIMONIDINE TARTRATE 0.2 % OP SOLN
1.0000 [drp] | Freq: Three times a day (TID) | OPHTHALMIC | Status: DC
  Administered 2022-11-22 – 2022-11-25 (×12): 1 [drp] via OPHTHALMIC
  Filled 2022-11-22: qty 5

## 2022-11-22 MED ORDER — SODIUM BICARBONATE 8.4 % IV SOLN
50.0000 meq | Freq: Once | INTRAVENOUS | Status: AC
  Administered 2022-11-22: 50 meq via INTRAVENOUS
  Filled 2022-11-22: qty 50

## 2022-11-22 MED ORDER — IOHEXOL 9 MG/ML PO SOLN
500.0000 mL | ORAL | Status: AC
  Administered 2022-11-22 (×2): 500 mL via ORAL

## 2022-11-22 MED ORDER — PEG-KCL-NACL-NASULF-NA ASC-C 100 G PO SOLR
0.5000 | Freq: Once | ORAL | Status: AC
  Administered 2022-11-22: 100 g via ORAL
  Filled 2022-11-22: qty 1

## 2022-11-22 MED ORDER — LATANOPROST 0.005 % OP SOLN
1.0000 [drp] | Freq: Every day | OPHTHALMIC | Status: DC
  Administered 2022-11-22 – 2022-11-25 (×4): 1 [drp] via OPHTHALMIC
  Filled 2022-11-22: qty 2.5

## 2022-11-22 MED ORDER — INSULIN ASPART 100 UNIT/ML IJ SOLN
5.0000 [IU] | Freq: Once | INTRAMUSCULAR | Status: AC
  Administered 2022-11-22: 5 [IU] via INTRAVENOUS

## 2022-11-22 MED ORDER — PEG-KCL-NACL-NASULF-NA ASC-C 100 G PO SOLR
0.5000 | Freq: Once | ORAL | Status: DC
  Filled 2022-11-22: qty 1

## 2022-11-22 MED ORDER — SODIUM ZIRCONIUM CYCLOSILICATE 10 G PO PACK
10.0000 g | PACK | Freq: Once | ORAL | Status: AC
  Administered 2022-11-22: 10 g via ORAL
  Filled 2022-11-22: qty 1

## 2022-11-22 NOTE — Progress Notes (Signed)
Patient has a hematoma on right groin which is soft to touch.

## 2022-11-22 NOTE — Progress Notes (Signed)
PROGRESS NOTE    KYLE LUPPINO  RUE:[id number] DOB: February 21, 1935 DOA: 11/20/2022 PCP: [name], FNP   Brief Narrative:  HPI: Trevor Murphy is a 88 y.o. male with medical history significant for chronic diastolic CHF (EF 14-78%), CAD, CKD stage IIIb, PAF no longer on anticoagulation, bradycardia, cryptogenic stroke s/p ILR, hepatic cirrhosis with thrombocytopenia, severe mitral regurgitation who presented to the ED for evaluation of abdominal pain with nausea and vomiting.   Patient reports acute onset of left lower abdominal pain beginning this morning.  He has had nausea and vomiting.  He reports known chronic bilateral inguinal hernias.  He denies diarrhea, constipation, dysuria, fevers, chills.  He lives at home alone but has family that checks in on him.  He uses a wheelchair outside of the house.   Recently hospitalized 09/23/2022-09/28/2022 with decompensated hepatic cirrhosis with ascites.  Required paracentesis with 2.1 L fluid removed.  Started on spironolactone.  He had severe thrombocytopenia and was considered high risk for bleeding therefore after shared decision making Eliquis was discontinued.   ED Course  Labs/Imaging on admission: I have personally reviewed following labs and imaging studies.   Initial vitals showed BP 184/61, pulse 53, RR 16, temperature not documented, SpO2 100% on room air.   Labs show WBC 7.0, hemoglobin 10.6, platelets 109,000, sodium 137, potassium 5.1, bicarb 17, BUN 26, creatinine 1.60, serum glucose 189, LFTs within normal limits, lactic acid 3.3, lipase 40.   CT abdomen/pelvis with contrast shows left groin hernia containing short segment of small bowel and moderate to large volume of fluid.  Small bowel loops within the hernia sac is dilated.  Wall thickening and mural edema in the right colon and transverse colon noted.  18 mm polypoid soft tissue lesion posterior bladder base also seen.  6 mm hypodensity in the pancreatic head reported.  Cirrhosis  with small to moderate volume ascites and prominent esophageal and paraesophageal varices, cholelithiasis, 1-2 mm nonobstructing stone lower pole right kidney also noted.   Patient was given IV morphine 4 mg, IV Valium 2.5 mg, and IV Zosyn.  General surgery consulted and have evaluated the patient.  Plan is for OR tonight for left inguinal hernia reduction and repair.  Due to multiple chronic medical comorbidities the hospitalist service was consulted to admit for further evaluation and management.  Assessment & Plan:   Principal Problem:   Incarcerated left inguinal hernia Active Problems:   Cirrhosis of liver with ascites   Chronic kidney disease, stage 3b   Paroxysmal atrial fibrillation   Chronic diastolic CHF (congestive heart failure)   Essential hypertension   Sinus bradycardia   History of CVA (cerebrovascular accident)   Thrombocytopenia   Severe mitral regurgitation  Incarcerated left inguinal hernia: S/p open left inguinal hernia repair 11/20/2022 by Dr. Sheliah Hatch.  Doing well.  Discussed with general surgery/Dr. Cliffton Asters.  No indication of antibiotics, will discontinued Zosyn on 11/21/2022.[name] Kitchen   Paroxysmal atrial fibrillation with sinus bradycardia: Remains in sinus bradycardia since admission but also asymptomatic.  Not on any rate control medications.  Monitor.   He is off anticoagulation after prior shared decision making on recent admission.  General surgery had cleared him for chemical DVT prophylaxis, I started him on Lovenox which was discontinued overnight due to sudden drop of hemoglobin.  Acute blood loss anemia/bright red blood per rectum: Overnight, his hemoglobin dropped to 5.9 all the way from 10 preoperatively.  There were no reports of GI bleed or any other source of obvious bleeding.  Since incarcerated left inguinal hernia repair should not cause such blood loss, there was some concern of intra-abdominal bleeding.  He was ordered 2 units of PRBC transfusion, he has  received only 1, he is going to receive another 1.  Night hospitalist ordered CT abdomen to find out intra-abdominal bleeding.  When I saw him, he was on the commode.  I was later notified by the nurse that patient actually had large amount of bright red blood per rectum.  Patient was not complaining of anything else but the abdominal pain.  He appeared hemodynamically stable.  I consulted [facility] immediately since this appears to be something that needs urgent attention.  I wonder if patient is going to benefit from CT abdomen pelvis versus colonoscopy.  Will defer to GI.  They are going to see him shortly.  We will repeat his hemoglobin after blood transfusion and will transfuse if it is less than 7.   Chronic diastolic CHF/severe mitral regurgitation: EF 55-60%.  Does not appear to be in decompensated CHF.  He is on spironolactone and [name] as an outpatient, currently on hold and will continue to hold since he is hyperkalemic.  Hyperkalemia: Hyperkalemic yesterday, received insulin, dextrose, Lokelma, improved to 5.4 but then again 6.2 this morning.  He received hyperkalemic treatment again.  Potassium is in process now.   History of cryptogenic stroke: Has ILR in place, recent review shows episodes of bradycardia as an outpatient.  Off of anticoagulation as above.   Hepatic cirrhosis with ascites: Small to moderate ascites noted on CT imaging.  He is fully alert and oriented.  No paracentesis indicated.   AKI on CKD stage IIIb: Baseline creatinine 1.5.  Was stable until yesterday but this morning 1.81, likely due to acute blood loss anemia.  Monitor.    Essential hypertension: BP elevated on admission.  Only on finasteride now.  Aldactone on hold.  Blood pressure controlled.   Chronic thrombocytopenia: Stable. No indication for transfusion.   Bladder lesion: 18 mm polypoid soft tissue lesion posterior bladder base noted on CT imaging.  Potentially median lobe hypertrophy of the prostate  gland although urothelial lesion not excluded per radiology read.  Findings discussed with patient's son.  Consider outpatient urology follow-up pending further goals of care.   Pancreatic head lesion: 6 mm hypodensity in pancreatic head noted on CT imaging.  This is felt to be a cyst or pseudocyst per radiology read.  Consider follow-up CT abdomen with contrast in 2 years pending goals of care.  DVT prophylaxis: SCDs Start: 11/20/22 1938   Code Status: DNR  Family Communication: Son present at bedside.  Plan of care discussed with patient in length and he/she verbalized understanding and agreed with it.  Status is: Inpatient Remains inpatient appropriate because: Having GI bleed.  Needs further assessment by GI.   Estimated body mass index is 18.56 kg/m as calculated from the following:   Height as of this encounter: 5' 10.5" (1.791 m).   Weight as of this encounter: 59.5 kg.    Nutritional Assessment: Body mass index is 18.56 kg/m.[name] Kitchen Seen by dietician.  I agree with the assessment and plan as outlined below: Nutrition Status:        . Skin Assessment: I have examined the patient's skin and I agree with the wound assessment as performed by the wound care RN as outlined below:    Consultants:  Surgery  Procedures:  As above  Antimicrobials:  Anti-infectives (From admission, onward)  Start     Dose/Rate Route Frequency Ordered Stop   11/21/22 0600  piperacillin-tazobactam (ZOSYN) IVPB 3.375 g  Status:  Discontinued        3.375 g 12.5 mL/hr over 240 Minutes Intravenous Every 8 hours 11/20/22 1818 11/21/22 1048   11/20/22 1830  piperacillin-tazobactam (ZOSYN) IVPB 3.375 g        3.375 g 100 mL/hr over 30 Minutes Intravenous  Once 11/20/22 1817 11/20/22 1952         Subjective: Seen and examined.  Son at the bedside.  Patient was on the commode.  He was complaining of mild abdominal pain but he was fully alert and oriented and hemodynamically stable.  No other  complaint.  Objective: Vitals:   11/22/22 0600 11/22/22 0635 11/22/22 0809 11/22/22 0922  BP: (!) 120/100 (!) 131/53 (!) 134/43 129/69  Pulse: 86 87 90 61  Resp: 19 17 15 18   Temp: (!) 97.2 F (36.2 C) (!) 96.5 F (35.8 C) 98.3 F (36.8 C) (!) 97.3 F (36.3 C)  TempSrc: Oral Oral Oral Oral  SpO2: 100% 100% 100% 100%  Weight:      Height:        Intake/Output Summary (Last 24 hours) at 11/22/2022 1041 Last data filed at 11/22/2022 0500 Gross per 24 hour  Intake 1597.65 ml  Output --  Net 1597.65 ml    Filed Weights   11/20/22 1406 11/21/22 0500  Weight: 61.7 kg 59.5 kg    Examination: General exam: Appears calm and comfortable  Respiratory system: Clear to auscultation. Respiratory effort normal. Cardiovascular system: S1 & S2 heard, RRR. No JVD, murmurs, rubs, gallops or clicks. No pedal edema. Gastrointestinal system: Abdomen is nondistended, soft and nontender. No organomegaly or masses felt. Normal bowel sounds heard. Central nervous system: Alert and oriented. No focal neurological deficits. Extremities: Symmetric 5 x 5 power. Skin: No rashes, lesions or ulcers.  Psychiatry: Judgement and insight appear normal. Mood & affect appropriate.    Data Reviewed: I have personally reviewed following labs and imaging studies  CBC: Recent Labs  Lab 11/20/22 1419 11/21/22 0101 11/22/22 0159 11/22/22 0352  WBC 7.0 6.2 8.3  --   NEUTROABS  --   --  6.7  --   HGB 10.6* 10.0* 5.9* 5.7*  HCT 33.8* 31.3* 18.2* 17.3*  MCV 94.7 93.2 93.3  --   PLT 109* 75* 56*  --     Basic Metabolic Panel: Recent Labs  Lab 11/20/22 1419 11/21/22 0101 11/21/22 0405 11/21/22 0627 11/21/22 1042 11/21/22 1535 11/22/22 0159  NA 137 136  --   --   --   --  134*  K 5.1 6.4* 6.5* 6.6* 6.2* 5.4* 6.2*  CL 113* 112*  --   --   --   --  112*  CO2 17* 15*  --   --   --   --  15*  GLUCOSE 189* 159*  --   --   --   --  100*  BUN 26* 26*  --   --   --   --  38*  CREATININE 1.60* 1.51*  --    --   --   --  1.81*  CALCIUM 8.2* 8.1*  --   --   --   --  7.3*    GFR: Estimated Creatinine Clearance: 25.1 mL/min (A) (by C-G formula based on SCr of 1.81 mg/dL (H)). Liver Function Tests: Recent Labs  Lab 11/20/22 1419  AST 29  ALT  21  ALKPHOS 112  BILITOT 1.1  PROT 6.9  ALBUMIN 2.8*    Recent Labs  Lab 11/20/22 1419  LIPASE 40    No results for input(s): "AMMONIA" in the last 168 hours. Coagulation Profile: Recent Labs  Lab 11/20/22 1626  INR 1.4*    Cardiac Enzymes: No results for input(s): "CKTOTAL", "CKMB", "CKMBINDEX", "TROPONINI" in the last 168 hours. BNP (last 3 results) No results for input(s): "PROBNP" in the last 8760 hours. HbA1C: No results for input(s): "HGBA1C" in the last 72 hours. CBG: No results for input(s): "GLUCAP" in the last 168 hours. Lipid Profile: No results for input(s): "CHOL", "HDL", "LDLCALC", "TRIG", "CHOLHDL", "LDLDIRECT" in the last 72 hours. Thyroid Function Tests: No results for input(s): "TSH", "T4TOTAL", "FREET4", "T3FREE", "THYROIDAB" in the last 72 hours. Anemia Panel: No results for input(s): "VITAMINB12", "FOLATE", "FERRITIN", "TIBC", "IRON", "RETICCTPCT" in the last 72 hours. Sepsis Labs: Recent Labs  Lab 11/20/22 1621 11/21/22 0101 11/21/22 1042 11/21/22 1735  LATICACIDVEN 3.3* 2.8* 4.8* 1.9     No results found for this or any previous visit (from the past 240 hour(s)).   Radiology Studies: CT Abdomen Pelvis W Contrast  Result Date: 11/20/2022 CLINICAL DATA:  Left lower quadrant abdominal pain. Nausea and vomiting. EXAM: CT ABDOMEN AND PELVIS WITH CONTRAST TECHNIQUE: Multidetector CT imaging of the abdomen and pelvis was performed using the standard protocol following bolus administration of intravenous contrast. RADIATION DOSE REDUCTION: This exam was performed according to the departmental dose-optimization program which includes automated exposure control, adjustment of the mA and/or kV according to  patient size and/or use of iterative reconstruction technique. CONTRAST:  60mL OMNIPAQUE IOHEXOL 350 MG/ML SOLN COMPARISON:  02/23/2022 FINDINGS: Lower chest: Unremarkable. Hepatobiliary: Irregular liver contour suggests cirrhosis. No focal abnormality within the liver parenchyma. Layering sludge/stones noted in the gallbladder. No intrahepatic or extrahepatic biliary dilation. Pancreas: 6 mm hypodensity is identified in the pancreatic head on image 34/3. Pancreas otherwise unremarkable without main duct dilatation. Spleen: No splenomegaly. No focal mass lesion. Adrenals/Urinary Tract: No adrenal nodule or mass. 1-2 mm nonobstructing stone noted lower pole right kidney. No left renal stones. Bilateral renal cysts evident with additional tiny hypodensities in both kidneys too small to characterize but likely benign. No followup imaging is recommended. No evidence for hydroureter. 18 mm polypoid soft tissue lesion noted posterior bladder base, potentially median lobe hypertrophy of the prostate gland although urothelial lesion not excluded. Stomach/Bowel: Stomach is distended with food. Duodenum is normally positioned as is the ligament of Treitz. No small bowel wall thickening. No small bowel dilatation. The terminal ileum is normal. Wall thickening and mural edema noted in the right colon and transverse colon. Diverticular disease noted left colon without diverticulitis. Vascular/Lymphatic: There is moderate atherosclerotic calcification of the abdominal aorta without aneurysm. There is no gastrohepatic or hepatoduodenal ligament lymphadenopathy. No retroperitoneal or mesenteric lymphadenopathy. Portal vein, superior mesenteric vein, and splenic vein are patent. Prominent esophageal and paraesophageal varices evident. No pelvic sidewall lymphadenopathy. Reproductive: Prostate gland is borderline enlarged. Other: Small to moderate volume ascites around the liver and spleen extending down into the pelvis.  Musculoskeletal: Left groin hernia contains a short segment of small bowel and a moderate to large volume of fluid. The small bowel loop in the hernia sac is dilated up to 2.2 cm diameter. There is some mesenteric edema in the small bowel loops adjacent to the herniated segment although no substantial wall thickening or pneumatosis evident. Right groin hernia contains only fluid. IMPRESSION: 1. Left  groin hernia contains a short segment of small bowel and a moderate to large volume of fluid. The small bowel loop in the hernia sac is dilated up to 2.2 cm diameter. There is some mesenteric edema in the small bowel loops adjacent to the herniated segment although no substantial wall thickening or pneumatosis evident. Small bowel proximal to the hernia does not appear substantially dilated although incarceration of the herniated segment cannot be excluded. 2. Wall thickening and mural edema in the right colon and transverse colon. Imaging features could be compatible with infectious/inflammatory colitis although given the apparent liver disease and ascites, this finding is nonspecific. 3. 18 mm polypoid soft tissue lesion posterior bladder base, potentially median lobe hypertrophy of the prostate gland although urothelial lesion not excluded. Urology consultation recommended. 4. 6 mm hypodensity in the pancreatic head. This is likely a cyst or pseudocyst. Given patient age and lesion size, follow-up CT abdomen with contrast in 2 years recommended. This recommendation follows ACR consensus guidelines: Management of Incidental Pancreatic Cysts: A White Paper of the ACR Incidental Findings Committee. J Am Coll Radiol 2017;14:911-923. 5. Cirrhosis with small to moderate volume ascites and prominent esophageal and paraesophageal varices compatible with portal venous hypertension. 6. Cholelithiasis. 7. 1-2 mm nonobstructing stone lower pole right kidney. 8.  Aortic Atherosclerosis (ICD10-I70.0). Electronically Signed   By:  Kennith Center M.D.   On: 11/20/2022 17:34    Scheduled Meds:  brinzolamide  1 drop Both Eyes TID   And   brimonidine  1 drop Both Eyes TID   finasteride  5 mg Oral Daily   iohexol  500 mL Oral Q1H   latanoprost  1 drop Both Eyes QHS   sodium chloride flush  3 mL Intravenous Q12H   Continuous Infusions:  sodium chloride 100 mL/hr at 11/21/22 1228     LOS: 2 days   Hughie Closs, MD [facility]  11/22/2022, 10:41 AM   *Please note that this is a verbal dictation therefore any spelling or grammatical errors are due to the "Dragon Medical One" system interpretation.  Please page via Amion and do not message via secure chat for urgent patient care matters. Secure chat can be used for non urgent patient care matters.  How to contact the [facility] Attending or Consulting provider 7A - 7P or covering provider during after hours 7P -7A, for this patient?  Check the care team in [facility] and look for a) attending/consulting [facility] provider listed and b) the [facility] team listed. Page or secure chat 7A-7P. Log into [email or web address] and use [place]'s universal password to access. If you do not have the password, please contact the hospital operator. Locate the [facility] provider you are looking for under [facility] and page to a number that you can be directly reached. If you still have difficulty reaching the provider, please page the [facility] (Director on Call) for the Hospitalists listed on amion for assistance.

## 2022-11-22 NOTE — Progress Notes (Addendum)
Subjective Alert. Had a large bloody stool this AM. Currently getting blood for hgb 5.9 from 10 yesterday. Denies nausea or vomiting. Some belching.   Family at the bedside states this patient had an episode of rectal bleeding 3-4 months ago that he was hospitalized for as well, they say the source never identified.  Patient denies a history of gastric ulcers, melena, or hematemesis.   Objective: Vital signs in last 24 hours: Temp:  [96.5 F (35.8 C)-98.3 F (36.8 C)] 98 F (36.7 C) (04/14 1058) Pulse Rate:  [55-97] 92 (04/14 1100) Resp:  [14-19] 15 (04/14 1100) BP: (105-156)/(43-100) 141/71 (04/14 1100) SpO2:  [95 %-100 %] 100 % (04/14 1100) Last BM Date : 11/22/22  Intake/Output from previous day: 04/13 0701 - 04/14 0700 In: 1597.7 [I.V.:1597.7] Out: -  Intake/Output this shift: No intake/output data recorded.  Gen: NAD, comfortable CV: RRR Pulm: Normal work of breathing Abd: Soft, mild distention, overall nontender,  Dressing in L groin dry, fullness over SP region but no visible or firm hematomas.  Ext: SCDs in place  Lab Results: CBC  Recent Labs    11/21/22 0101 11/22/22 0159 11/22/22 0352  WBC 6.2 8.3  --   HGB 10.0* 5.9* 5.7*  HCT 31.3* 18.2* 17.3*  PLT 75* 56*  --    BMET Recent Labs    11/21/22 0101 11/21/22 0405 11/22/22 0159 11/22/22 1029  NA 136  --  134*  --   K 6.4*   < > 6.2* 5.4*  CL 112*  --  112*  --   CO2 15*  --  15*  --   GLUCOSE 159*  --  100*  --   BUN 26*  --  38*  --   CREATININE 1.51*  --  1.81*  --   CALCIUM 8.1*  --  7.3*  --    < > = values in this interval not displayed.   PT/INR Recent Labs    11/20/22 1626  LABPROT 16.9*  INR 1.4*   ABG No results for input(s): "PHART", "HCO3" in the last 72 hours.  Invalid input(s): "PCO2", "PO2"  Studies/Results:  Anti-infectives: Anti-infectives (From admission, onward)    Start     Dose/Rate Route Frequency Ordered Stop   11/21/22 0600  piperacillin-tazobactam (ZOSYN)  IVPB 3.375 g  Status:  Discontinued        3.375 g 12.5 mL/hr over 240 Minutes Intravenous Every 8 hours 11/20/22 1818 11/21/22 1048   11/20/22 1830  piperacillin-tazobactam (ZOSYN) IVPB 3.375 g        3.375 g 100 mL/hr over 30 Minutes Intravenous  Once 11/20/22 1817 11/20/22 1952        Assessment/Plan: Patient Active Problem List   Diagnosis Date Noted   Incarcerated left inguinal hernia 11/20/2022   Thrombocytopenia 11/20/2022   Severe mitral regurgitation 11/20/2022   Failure to thrive in adult 09/26/2022   Renal insufficiency 09/26/2022   Pancytopenia 09/24/2022   Weakness 09/23/2022   Lower leg edema 09/23/2022   Abdomen enlarged 09/23/2022   Other ascites 09/23/2022   Inguinal bulge 09/23/2022   Hiatal hernia 09/23/2022   Cirrhosis of liver with ascites 09/23/2022   Leg swelling 07/01/2022   Hematuria, gross 06/18/2022   Status post placement of implantable loop recorder 06/18/2022   History of CVA (cerebrovascular accident) 06/08/2022   Duodenal ulcer, unspecified as acute or chronic, without hemorrhage or perforation 01/09/2022   Iron deficiency anemia 01/09/2022   PVD (peripheral vascular disease) 08/19/2021   Mixed  hyperlipidemia 08/12/2020   Acute blood loss anemia 08/11/2020   Paroxysmal atrial fibrillation 06/07/2020   Secondary hypercoagulable state 06/07/2020   Ischemic cerebrovascular accident (CVA) of frontal lobe 05/20/2019   Pain due to onychomycosis of toenails of both feet 01/20/2019   Prediabetes 07/22/2018   Nail abnormality 07/22/2018   Sinus bradycardia 05/02/2016   Hypotension due to drugs 05/02/2016   History of tachycardia 05/02/2016   GERD (gastroesophageal reflux disease) 02/05/2015   Coronary artery disease involving native coronary artery of native heart without angina pectoris 04/20/2014   Atypical chest pain 04/12/2014   Hypertensive urgency 03/11/2014   Chronic diastolic CHF (congestive heart failure) 01/30/2014   Alcohol abuse  01/19/2014   Elevated troponin level not due myocardial infarction 12/19/2013   Gout 12/19/2013   Right knee pain 12/19/2013   Dyslipidemia 12/16/2013   Hypertensive crisis 12/16/2013   Cerebral infarction (HCC) 07/28/2012   Acute on chronic renal failure 07/27/2012   Essential hypertension 07/26/2012   Chronic kidney disease, stage 3b 07/26/2012   Poor circulation    Recurrent ventral hernia 02/16/2012   Hernia (acquired) (recurrent) 02/10/2011   s/p Procedure(s): OPEN LEFT INGUINAL HERNIA REPAIR FOR GANGRENE AND SMALL BOWEL RESECTION WITH STINASTIMOSIST 11/20/2022 - AFVSS  - significant drop in hgb with large volume maroon stool this AM. Getting 2 u RBC. Chemical VTE held.  - he also has thrombocytopenia - plts 56 today. May need platelet transfusion.  - it is possible that he is bleeding from his small bowel anastomosis but could also be a diverticular bleed. CT abdomen and pelvis ordered by primary team is pending without contrast. Agree with GI that a CTA would likely give more information - if we are trying to localize a bleed and not just identify a possible hematoma/hemoperitoneum then IV contrast is needed.  Low suspicion for large volume hemoperitoneum based on overall benign abdominal exam. Continue to transfuse PRN. No emergent surgical needs right now. [facility] will follow closely.   PMH decompensated cirrhosis with esophageal varices. GI consulted and following. Possible colonoscopy tomorrow which is ok from [facility] standpoint   LOS: 2 days   Check [email or web address] for General Surgery coverage night/weekend/holidays  No secure chat available for me given surgeries/clinic/off post call which would lead to a delay in care.    Hosie Spangle, [facility] Surgery, A [facility]

## 2022-11-22 NOTE — Consult Note (Addendum)
Weekend coverage for Dr. Loreta Ave and Elnoria Howard  Consultation  Referring Provider: Dr. Jacqulyn Bath   Primary Care Physician:  [name], FNP Primary Gastroenterologist: Dr. Elnoria Howard       Reason for Consultation: Anemia with hematochezia            HPI:   Trevor Murphy is a 88 y.o. male with past medical history as listed below including chronic diastolic CHF (EF 16-10%), CAD, CKD stage III, PAF no longer on anticoagulation, bradycardia, cryptogenic stroke status post ILR, hepatic cirrhosis with thrombocytopenia, severe mitral regurgitation who presented to the ED on 11/20/2022 for abdominal pain with nausea and vomiting.  We are consulted now in regards to a drop in patient's hemoglobin with an episode of hematochezia.    At time of admission: patient had acute onset of left lower abdominal pain which had started that morning with nausea and vomiting.  Describes chronic bilateral inguinal hernias.  Recent hospitalization noted 2/14-2/19 with decompensated hepatic cirrhosis with ascites which required paracentesis with 2.1 L fluid removed and started on Spironolactone.  Also severe thrombocytopenia and was considered high risk for bleeding therefore decision made to stop Eliquis.    Hospital course: Patient had hernia repair on 11/20/2022.  Apparently placed on Lovenox for DVT prophylaxis on 11/21/2022, potassium 6.6 yesterday and he received treatment for hyperkalemia, labs this morning showed a hemoglobin of 5.9 (10.0 yesterday), patient not tachycardic or hypertensive, per RN no bleeding at that point, platelet count had dropped to 56.  Patient's transfusion of 2 units PRBCs was started at 6:00 this morning. He then had episode of maroon/ brb mixture when going to the bathroom around 10:15 this morning and we were called.    At time of my interview with the patient he tells me that he feels just fine.  He did have some abdominal pain and vomiting prior to presentation at the hospital but has not had any since  repair of his hernia.  He denies any abdominal pain now.  He is resting comfortably chewing on his bubblegum.  Tells me he got up to have a bowel movement and the nursing staff told him it was just blood.  It is still in the basin at time of my interview and does fill up at least a third of the container.    Denies fever or chills.  ED course: BP 184/61, pulse 53, RR 16, labs with a hemoglobin of 10.6, platelets 109,000, sodium 137, potassium 5.1, BUN 26, creatinine 1.6, LFTs within normal limits, lipase 40; CTAP with contrast showed left groin hernia containing short segment of small bowel with moderate to large volume of fluid, small bowel loops within the hernia sac were dilated, wall thickening and mural edema in the right colon and transverse colon noted, 18 mm polypoid soft tissue lesion posterior bladder base also seen, 6 mm hypodensity in the pancreatic head reported, cirrhosis with small to moderate volume ascites and prominent esophageal and paraesophageal varices as well as cholelithiasis and a 1 to 2 mm nonobstructing stone lower pole right kidney  GI history: 09/24/2022 patient consulted by Dr. Elnoria Howard for new onset decompensated cirrhosis; at that time presumed to be alcohol even though he quit in 2017, he did have thrombocytopenia and esophageal varices with increasing creatinine.  Noted he did have cirrhosis with onset of thrombocytopenia in 2022 worked up by hematology.  At that time extensive serologic workup was deferred given that he was elderly and frail with multiple medical problems.  He  was told to remain on 2 g sodium diet and stay on Spironolactone.  Discussed possible outpatient EGD to assess variceal signs in the future.  During that admission palliative care consult was recommended given decompensated cirrhosis and not a candidate for more aggressive measures such as transplant and poor candidate for TIPS if his ascites were refractory  Past Medical History:  Diagnosis Date    Arthritis    CAD (coronary artery disease)    a. Cath 08/2009: minimal nonobstructive disease (25% LAD). b. Lexiscan nuc 6/215: EF 54%, no ischemia or infarction.   Chronic diastolic CHF (congestive heart failure)    a. Echo 2013: EF 55-60%, mild AI, mild MR. b. Echo 5/15 with EF 60-65%, no WMA, mild LVH, mild AI.   CKD (chronic kidney disease) stage 3, GFR 30-59 ml/min 07/26/2012   a. Felt likely due to HTN.   CVA (cerebral infarction) 07/28/2012   Glaucoma    Gout    H/O umbilical hernia repair    Headache(784.0)    " WHEN MY BLOOD PRESSURE IS UP"   History of acute renal failure    History of alcohol abuse    HTN (hypertension)    Hypertension    a. Patent renal arteries 2005.   Noncompliance    OA (osteoarthritis)    Other and unspecified hyperlipidemia    Poor circulation    Sinus bradycardia    SVT (supraventricular tachycardia)    a. Possible AVNRT.  Got adenosine from EMS in 2015 for SVT.  During 5/15 hospitalization had episode of short R-P tachycardia (possible AVNRT) terminated by adenosine.  b. 04/2016 - recurrent tachycardia (SVT vs. AVNRT), again terminated by Adenosine   Visual impairment     Past Surgical History:  Procedure Laterality Date   APPENDECTOMY     BIOPSY  08/12/2020   Procedure: BIOPSY;  Surgeon: Charna Elizabeth, MD;  Location: [facility] ENDOSCOPY;  Service: Endoscopy;;   BUBBLE STUDY  05/24/2019   Procedure: BUBBLE STUDY;  Surgeon: Little Ishikawa, MD;  Location: [facility] ENDOSCOPY;  Service: Endoscopy;;   CARDIAC CATHETERIZATION  11/2003   EF 50-55%   CARDIAC CATHETERIZATION  08/2009   CATARACT EXTRACTION EXTRACAPSULAR Right 05/17/2013   Procedure: CATARACT EXTRACTION EXTRACAPSULAR WITH INTRAOCULAR LENS PLACEMENT (IOC) RIGHT EYE;  Surgeon: Chalmers Guest, MD;  Location: [facility] OR;  Service: Ophthalmology;  Laterality: Right;   COLONOSCOPY     ESOPHAGOGASTRODUODENOSCOPY (EGD) WITH PROPOFOL N/A 08/12/2020   Procedure: ESOPHAGOGASTRODUODENOSCOPY (EGD) WITH PROPOFOL;   Surgeon: Charna Elizabeth, MD;  Location: [facility] ENDOSCOPY;  Service: Endoscopy;  Laterality: N/A;   HERNIA REPAIR     INGUINAL HERNIA REPAIR Left 11/20/2022   Procedure: OPEN LEFT INGUINAL HERNIA REPAIR FOR GANGRENE AND SMALL BOWEL RESECTION WITH STINASTIMOSIST;  Surgeon: Kinsinger, De Blanch, MD;  Location: [facility] OR;  Service: General;  Laterality: Left;   IR PARACENTESIS  09/24/2022   LOOP RECORDER INSERTION N/A 05/24/2019   Procedure: LOOP RECORDER INSERTION;  Surgeon: Regan Lemming, MD;  Location: [facility] INVASIVE CV LAB;  Service: Cardiovascular;  Laterality: N/A;   TEE WITHOUT CARDIOVERSION N/A 05/24/2019   Procedure: TRANSESOPHAGEAL ECHOCARDIOGRAM (TEE);  Surgeon: Little Ishikawa, MD;  Location: [facility] ENDOSCOPY;  Service: Endoscopy;  Laterality: N/A;   VENTRAL HERNIA REPAIR  03/07/2012   Procedure: LAPAROSCOPIC VENTRAL HERNIA;  Surgeon: Cherylynn Ridges, MD;  Location: [facility] OR;  Service: General;  Laterality: N/A;    Family History  Problem Relation Age of Onset   CVA Mother    Hypertension Brother  Hypertension Sister    Hypertension Brother    Stroke Sister    Heart attack Neg Hx     Social History   Tobacco Use   Smoking status: Never   Smokeless tobacco: Never  Vaping Use   Vaping Use: Never used  Substance Use Topics   Alcohol use: No    Alcohol/week: 1.0 standard drink of alcohol    Types: 1 Cans of beer per week    Comment: h/o heavy use, quit in 2017   Drug use: No    Prior to Admission medications   Medication Sig Start Date End Date Taking? Authorizing Provider  acetaminophen (TYLENOL) 500 MG tablet Take 1,000 mg by mouth 2 (two) times daily as needed for mild pain or moderate pain (leg pain).    [provider]  dapagliflozin propanediol ([name]) 10 MG TABS tablet Take 1 tablet (10 mg total) by mouth daily before breakfast. 11/18/22   [name], FNP  ferrous sulfate (FEROSUL) 325 (65 FE) MG tablet TAKE 1 TABLET(325 MG) BY MOUTH DAILY 11/09/22   [name], FNP  finasteride (PROSCAR) 5 MG tablet Take 5 mg by mouth daily. 03/09/22   [provider]  LUMIGAN 0.01 % SOLN Place 1 drop into both eyes at bedtime. 08/28/19   [provider]  SIMBRINZA 1-0.2 % SUSP Follow up with your PCP on when to restart this medication. Patient taking differently: Place 1 drop into both eyes 3 (three) times daily. 06/09/22   Narda Bonds, MD  spironolactone (ALDACTONE) 50 MG tablet Take 1 tablet (50 mg total) by mouth daily. 09/29/22   Lewie Chamber, MD    Current Facility-Administered Medications  Medication Dose Route Frequency Provider Last Rate Last Admin   0.9 %  sodium chloride infusion   Intravenous Continuous Hughie Closs, MD 100 mL/hr at 11/21/22 1228 New Bag at 11/21/22 1228   acetaminophen (TYLENOL) tablet 650 mg  650 mg Oral Q6H PRN Charlsie Quest, MD   650 mg at 11/22/22 1610   Or   acetaminophen (TYLENOL) suppository 650 mg  650 mg Rectal Q6H PRN Charlsie Quest, MD       brinzolamide (AZOPT) 1 % ophthalmic suspension 1 drop  1 drop Both Eyes TID Hughie Closs, MD       And   brimonidine (ALPHAGAN) 0.2 % ophthalmic solution 1 drop  1 drop Both Eyes TID Hughie Closs, MD       finasteride (PROSCAR) tablet 5 mg  5 mg Oral Daily Hughie Closs, MD   5 mg at 11/22/22 0915   hydrALAZINE (APRESOLINE) injection 10 mg  10 mg Intravenous Q4H PRN Hughie Closs, MD       HYDROmorphone (DILAUDID) injection 0.5 mg  0.5 mg Intravenous Q2H PRN Charlsie Quest, MD       iohexol (OMNIPAQUE) 9 MG/ML oral solution 500 mL  500 mL Oral Q1H John Giovanni, MD       latanoprost (XALATAN) 0.005 % ophthalmic solution 1 drop  1 drop Both Eyes QHS Pahwani, Ravi, MD       ondansetron (ZOFRAN) tablet 4 mg  4 mg Oral Q6H PRN Charlsie Quest, MD       Or   ondansetron (ZOFRAN) injection 4 mg  4 mg Intravenous Q6H PRN Darreld Mclean R, MD       sodium chloride flush (NS) 0.9 % injection 3 mL  3 mL Intravenous Q12H Darreld Mclean R, MD   3 mL at 11/22/22  0915   traMADol (ULTRAM) tablet 50 mg  50 mg Oral Q6H PRN Kinsinger, De Blanch, MD        Allergies as of 11/20/2022   (No Known Allergies)     Review of Systems:    Constitutional: No weight loss, fever or chills Skin: No rash  Cardiovascular: No chest pain   Respiratory: No SOB Gastrointestinal: See HPI and otherwise negative Genitourinary: No dysuria  Neurological: No headache, dizziness or syncope Musculoskeletal: No new muscle or joint pain Hematologic: No bruising Psychiatric: No history of depression or anxiety    Physical Exam:  Vital signs in last 24 hours: Temp:  [96.5 F (35.8 C)-98.3 F (36.8 C)] 97.3 F (36.3 C) (04/14 0922) Pulse Rate:  [55-90] 61 (04/14 0922) Resp:  [14-19] 18 (04/14 0922) BP: (105-134)/(43-100) 129/69 (04/14 0922) SpO2:  [95 %-100 %] 100 % (04/14 6045)   General:   Pleasant AA male appears to be in NAD, Well developed, Well nourished, alert and cooperative Head:  Normocephalic and atraumatic. Eyes:   PEERL, EOMI. No icterus. Conjunctiva pink. Ears:  Normal auditory acuity. Neck:  Supple Throat: Oral cavity and pharynx without inflammation, swelling or lesion.  Lungs: Respirations even and unlabored. Lungs clear to auscultation bilaterally.   No wheezes, crackles, or rhonchi.  Heart: Normal S1, S2. No MRG. Regular rate and rhythm. No peripheral edema, cyanosis or pallor.  Abdomen:  Soft, nondistended, nontender. No rebound or guarding. Normal bowel sounds. No appreciable masses or hepatomegaly. +fresh bandage in LLQ over area of recent hernia repair, easily reducible right inguinal hernia with no tenderness Rectal:  Not performed.  Msk:  Symmetrical without gross deformities. Peripheral pulses intact.  Extremities:  Without edema, no deformity or joint abnormality.  Neurologic:  Alert and  oriented x4;  grossly normal neurologically.  Skin:   Dry and intact without significant lesions or rashes. Psychiatric: Demonstrates good judgement  and reason without abnormal affect or behaviors.   LAB RESULTS: Recent Labs    11/20/22 1419 11/21/22 0101 11/22/22 0159 11/22/22 0352  WBC 7.0 6.2 8.3  --   HGB 10.6* 10.0* 5.9* 5.7*  HCT 33.8* 31.3* 18.2* 17.3*  PLT 109* 75* 56*  --    BMET Recent Labs    11/20/22 1419 11/21/22 0101 11/21/22 0405 11/21/22 1042 11/21/22 1535 11/22/22 0159  NA 137 136  --   --   --  134*  K 5.1 6.4*   < > 6.2* 5.4* 6.2*  CL 113* 112*  --   --   --  112*  CO2 17* 15*  --   --   --  15*  GLUCOSE 189* 159*  --   --   --  100*  BUN 26* 26*  --   --   --  38*  CREATININE 1.60* 1.51*  --   --   --  1.81*  CALCIUM 8.2* 8.1*  --   --   --  7.3*   < > = values in this interval not displayed.   LFT Recent Labs    11/20/22 1419  PROT 6.9  ALBUMIN 2.8*  AST 29  ALT 21  ALKPHOS 112  BILITOT 1.1   PT/INR Recent Labs    11/20/22 1626  LABPROT 16.9*  INR 1.4*    STUDIES: CT Abdomen Pelvis W Contrast  Result Date: 11/20/2022 CLINICAL DATA:  Left lower quadrant abdominal pain. Nausea and vomiting. EXAM: CT ABDOMEN AND PELVIS WITH CONTRAST TECHNIQUE: Multidetector CT imaging of the  abdomen and pelvis was performed using the standard protocol following bolus administration of intravenous contrast. RADIATION DOSE REDUCTION: This exam was performed according to the departmental dose-optimization program which includes automated exposure control, adjustment of the mA and/or kV according to patient size and/or use of iterative reconstruction technique. CONTRAST:  67mL OMNIPAQUE IOHEXOL 350 MG/ML SOLN COMPARISON:  02/23/2022 FINDINGS: Lower chest: Unremarkable. Hepatobiliary: Irregular liver contour suggests cirrhosis. No focal abnormality within the liver parenchyma. Layering sludge/stones noted in the gallbladder. No intrahepatic or extrahepatic biliary dilation. Pancreas: 6 mm hypodensity is identified in the pancreatic head on image 34/3. Pancreas otherwise unremarkable without main duct  dilatation. Spleen: No splenomegaly. No focal mass lesion. Adrenals/Urinary Tract: No adrenal nodule or mass. 1-2 mm nonobstructing stone noted lower pole right kidney. No left renal stones. Bilateral renal cysts evident with additional tiny hypodensities in both kidneys too small to characterize but likely benign. No followup imaging is recommended. No evidence for hydroureter. 18 mm polypoid soft tissue lesion noted posterior bladder base, potentially median lobe hypertrophy of the prostate gland although urothelial lesion not excluded. Stomach/Bowel: Stomach is distended with food. Duodenum is normally positioned as is the ligament of Treitz. No small bowel wall thickening. No small bowel dilatation. The terminal ileum is normal. Wall thickening and mural edema noted in the right colon and transverse colon. Diverticular disease noted left colon without diverticulitis. Vascular/Lymphatic: There is moderate atherosclerotic calcification of the abdominal aorta without aneurysm. There is no gastrohepatic or hepatoduodenal ligament lymphadenopathy. No retroperitoneal or mesenteric lymphadenopathy. Portal vein, superior mesenteric vein, and splenic vein are patent. Prominent esophageal and paraesophageal varices evident. No pelvic sidewall lymphadenopathy. Reproductive: Prostate gland is borderline enlarged. Other: Small to moderate volume ascites around the liver and spleen extending down into the pelvis. Musculoskeletal: Left groin hernia contains a short segment of small bowel and a moderate to large volume of fluid. The small bowel loop in the hernia sac is dilated up to 2.2 cm diameter. There is some mesenteric edema in the small bowel loops adjacent to the herniated segment although no substantial wall thickening or pneumatosis evident. Right groin hernia contains only fluid. IMPRESSION: 1. Left groin hernia contains a short segment of small bowel and a moderate to large volume of fluid. The small bowel loop in  the hernia sac is dilated up to 2.2 cm diameter. There is some mesenteric edema in the small bowel loops adjacent to the herniated segment although no substantial wall thickening or pneumatosis evident. Small bowel proximal to the hernia does not appear substantially dilated although incarceration of the herniated segment cannot be excluded. 2. Wall thickening and mural edema in the right colon and transverse colon. Imaging features could be compatible with infectious/inflammatory colitis although given the apparent liver disease and ascites, this finding is nonspecific. 3. 18 mm polypoid soft tissue lesion posterior bladder base, potentially median lobe hypertrophy of the prostate gland although urothelial lesion not excluded. Urology consultation recommended. 4. 6 mm hypodensity in the pancreatic head. This is likely a cyst or pseudocyst. Given patient age and lesion size, follow-up CT abdomen with contrast in 2 years recommended. This recommendation follows ACR consensus guidelines: Management of Incidental Pancreatic Cysts: A White Paper of the ACR Incidental Findings Committee. J Am Coll Radiol 2017;14:911-923. 5. Cirrhosis with small to moderate volume ascites and prominent esophageal and paraesophageal varices compatible with portal venous hypertension. 6. Cholelithiasis. 7. 1-2 mm nonobstructing stone lower pole right kidney. 8.  Aortic Atherosclerosis (ICD10-I70.0). Electronically Signed   By:  Kennith Center M.D.   On: 11/20/2022 17:34      Impression / Plan:   Impression: 1.  Acute blood loss anemia with episode of hematochezia: Hemoglobin 10.6 (11/20/22)--> 10.0--> 5.9--> 5.7, did have CT with diverticular disease and some colonic wall thickening at admission, passed a large volume of maroon/bright red blood around 1015 this morning, completely asymptomatic, did get a dose of Lovenox yesterday for DVT prophylaxis; consider likely diverticular bleed versus possible relation to hernias, though less  likely 2.   Incarcerated left inguinal hernia: General surgery reduced and repaired on 4/12 3.  A-fib with sinus bradycardia: He was in sinus bradycardia on admission, asymptomatic, off anticoagulation given prior low platelets and bleeding risk 4.  Chronic diastolic CHF/severe mitral regurgitation: EF 55-60%, on Spironolactone and [name] 5.  History of cryptogenic stroke: Has ILR in place 6.  Hepatic cirrhosis with ascites: Small to moderate ascites on CT, mentating well on admission 7.  CKD stage III: Renal function stable 8.  Thrombocytopenia: Platelets 109 admission--> 56 today 9.  Bladder lesion: 18 mm polypoid soft tissue lesion posterior bladder base on CT, consideration for outpatient urology follow-up pending goals of care 10.  Pancreatic head lesion: 6 mm hypodensity in pancreatic head on CT, felt to be a cyst or pseudocyst, recommendations for follow-up CTAP with contrast in 2 years pending goals of care  Plan: 1.  Continue to monitor hemoglobin with transfusion as needed less than 7 2.  Continue to monitor for further bloody bowel movements and document well. 3.  Agree with clear liquid diet 4.  Ideally would proceed with CTA instead of CT abdomen pelvis without contrast as I am not sure this would tell us much, but creatinine is 1.81 this morning.  Instead we will go ahead and prep the patient this evening for possible colonoscopy tomorrow pending status of bleeding and hemoglobin rechecks.  This would be with Dr. Elnoria Howard as he takes over the service tomorrow. 5.  Movi prep ordered to start at 3:00 this afternoon and split dose fashion, n.p.o. at midnight.   Thank you for your kind consultation, we will continue to follow.  Violet Baldy [facility]  11/22/2022, 10:33 AM  ----------------------------------------------------------------------------  ADDENDUM: Discussed case with Dr. Loreta Ave.  Given high likelihood of anastomotic bleed, will cancel bowel prep and potential  colonoscopy  Reesa Gotschall E. Tomasa Rand, MD [facility]

## 2022-11-22 NOTE — Progress Notes (Signed)
PT Cancellation Note  Patient Details Name: Trevor Murphy MRN: [id number] DOB: 11/03/1934   Cancelled Treatment:    Reason Eval/Treat Not Completed: Patient not medically ready (pt on 2nd unit of blood, large GIB per RN and await CT abdomen. Will hold this date)   [name] 11/22/2022, 11:21 AM Merryl Hacker, PT Acute Rehabilitation Services Office: [phone]

## 2022-11-22 NOTE — Progress Notes (Signed)
Blood consent  given by son, Aurther Loft, 2 RNs witness it.  Transfusion started at 0620, patient tolerated well. Report given to incoming RN.

## 2022-11-22 NOTE — Progress Notes (Signed)
OT Cancellation Note  Patient Details Name: SAMRATH PELT MRN: [id number] DOB: 1935-08-04   Cancelled Treatment:    Reason Eval/Treat Not Completed: Patient not medically ready (pt on 2nd unit of blood, large GIB per RN and await CT abdomen. Will hold this date)  Limmie Patricia, OTR/L,CBIS  Supplemental OT - [facility] Secure Chat Preferred   11/22/2022, 12:45 PM

## 2022-11-22 NOTE — Progress Notes (Signed)
Overnight progress note  Patient admitted for incarcerated left inguinal hernia and underwent surgical repair on 11/20/2022.  History of paroxysmal A-fib but no longer on chronic anticoagulation.  He was placed on Lovenox for DVT prophylaxis yesterday.  Potassium was 6.6 yesterday and he received treatment for hyperkalemia  Labs this morning showing hemoglobin 5.9, was 10.0 yesterday.  Patient is not tachycardic or hypotensive.  Per RN, no bleeding from surgical site and no other obvious signs of bleeding.  Patient is not complaining of any abdominal or groin pain, resting comfortably.  Platelet count 56k, has chronic thrombocytopenia and platelet count was in the 50-60k range on labs done over a month ago as well.   -Type and screen, urgent repeat H&H ordered to confirm.  Transfuse PRBCs if hemoglobin less than 7.  Patient has given consent for blood transfusion.  Also order CT abdomen pelvis to rule out hematoma if hemoglobin still low on repeat labs.  Will hold Lovenox.  Also remains hyperkalemic with potassium 6.2 on morning labs today.   -Insulin/dextrose, sodium bicarb, and Lokelma ordered.  Repeat labs after treatment to check potassium level.

## 2022-11-23 ENCOUNTER — Ambulatory Visit (INDEPENDENT_AMBULATORY_CARE_PROVIDER_SITE_OTHER): Payer: 59

## 2022-11-23 ENCOUNTER — Encounter (HOSPITAL_COMMUNITY)
Admission: EM | Disposition: A | Discharge status: Home Health | Payer: Self-pay | Source: Home / Self Care | Attending: Family Medicine

## 2022-11-23 DIAGNOSIS — K4030 Unilateral inguinal hernia, with obstruction, without gangrene, not specified as recurrent: Secondary | ICD-10-CM | POA: Diagnosis not present

## 2022-11-23 DIAGNOSIS — I639 Cerebral infarction, unspecified: Secondary | ICD-10-CM | POA: Diagnosis not present

## 2022-11-23 DIAGNOSIS — D696 Thrombocytopenia, unspecified: Secondary | ICD-10-CM | POA: Diagnosis not present

## 2022-11-23 DIAGNOSIS — I340 Nonrheumatic mitral (valve) insufficiency: Secondary | ICD-10-CM | POA: Diagnosis not present

## 2022-11-23 LAB — CBC WITH DIFFERENTIAL/PLATELET
Abs Immature Granulocytes: 0.02 10*3/uL (ref 0.00–0.07)
Abs Immature Granulocytes: 0.02 10*3/uL (ref 0.00–0.07)
Basophils Absolute: 0 10*3/uL (ref 0.0–0.1)
Basophils Absolute: 0 10*3/uL (ref 0.0–0.1)
Basophils Relative: 0 %
Basophils Relative: 0 %
Eosinophils Absolute: 0.2 10*3/uL (ref 0.0–0.5)
Eosinophils Absolute: 0.2 10*3/uL (ref 0.0–0.5)
Eosinophils Relative: 3 %
Eosinophils Relative: 4 %
HCT: 26.4 % — ABNORMAL LOW (ref 39.0–52.0)
HCT: 25.2 % — ABNORMAL LOW (ref 39.0–52.0)
Hemoglobin: 8.7 g/dL — ABNORMAL LOW (ref 13.0–17.0)
Hemoglobin: 8.3 g/dL — ABNORMAL LOW (ref 13.0–17.0)
Immature Granulocytes: 0 %
Immature Granulocytes: 0 %
Lymphocytes Relative: 8 %
Lymphocytes Relative: 8 %
Lymphs Abs: 0.5 10*3/uL — ABNORMAL LOW (ref 0.7–4.0)
Lymphs Abs: 0.5 10*3/uL — ABNORMAL LOW (ref 0.7–4.0)
MCH: 29.4 pg (ref 26.0–34.0)
MCH: 29.2 pg (ref 26.0–34.0)
MCHC: 33 g/dL (ref 30.0–36.0)
MCHC: 32.9 g/dL (ref 30.0–36.0)
MCV: 89.2 fL (ref 80.0–100.0)
MCV: 88.7 fL (ref 80.0–100.0)
Monocytes Absolute: 0.6 10*3/uL (ref 0.1–1.0)
Monocytes Absolute: 0.6 10*3/uL (ref 0.1–1.0)
Monocytes Relative: 9 %
Monocytes Relative: 10 %
Neutro Abs: 5.1 10*3/uL (ref 1.7–7.7)
Neutro Abs: 4.7 10*3/uL (ref 1.7–7.7)
Neutrophils Relative %: 80 %
Neutrophils Relative %: 78 %
Platelets: 61 10*3/uL — ABNORMAL LOW (ref 150–400)
Platelets: 72 10*3/uL — ABNORMAL LOW (ref 150–400)
RBC: 2.96 MIL/uL — ABNORMAL LOW (ref 4.22–5.81)
RBC: 2.84 MIL/uL — ABNORMAL LOW (ref 4.22–5.81)
RDW: 17.3 % — ABNORMAL HIGH (ref 11.5–15.5)
RDW: 17.2 % — ABNORMAL HIGH (ref 11.5–15.5)
WBC: 6.4 10*3/uL (ref 4.0–10.5)
WBC: 6 10*3/uL (ref 4.0–10.5)
nRBC: 0 % (ref 0.0–0.2)
nRBC: 0 % (ref 0.0–0.2)

## 2022-11-23 LAB — TYPE AND SCREEN
ABO/RH(D): O NEG
Antibody Screen: NEGATIVE
Unit division: 0
Unit division: 0

## 2022-11-23 LAB — HEMOGLOBIN AND HEMATOCRIT, BLOOD
HCT: 25.3 % — ABNORMAL LOW (ref 39.0–52.0)
Hemoglobin: 8.6 g/dL — ABNORMAL LOW (ref 13.0–17.0)

## 2022-11-23 LAB — BPAM RBC
Blood Product Expiration Date: 202404222359
ISSUE DATE / TIME: 202404140554
ISSUE DATE / TIME: 202404141043
Unit Type and Rh: 9500
Unit Type and Rh: 9500

## 2022-11-23 LAB — BASIC METABOLIC PANEL
Anion gap: 4 — ABNORMAL LOW (ref 5–15)
BUN: 32 mg/dL — ABNORMAL HIGH (ref 8–23)
CO2: 19 mmol/L — ABNORMAL LOW (ref 22–32)
Calcium: 7.5 mg/dL — ABNORMAL LOW (ref 8.9–10.3)
Chloride: 115 mmol/L — ABNORMAL HIGH (ref 98–111)
Creatinine, Ser: 1.57 mg/dL — ABNORMAL HIGH (ref 0.61–1.24)
GFR, Estimated: 43 mL/min — ABNORMAL LOW (ref >60)
Glucose, Bld: 105 mg/dL — ABNORMAL HIGH (ref 70–99)
Potassium: 5 mmol/L (ref 3.5–5.1)
Sodium: 138 mmol/L (ref 135–145)

## 2022-11-23 SURGERY — COLONOSCOPY WITH PROPOFOL
Anesthesia: Monitor Anesthesia Care

## 2022-11-23 NOTE — Progress Notes (Signed)
Progress Note  3 Days Post-Op  Subjective: Sitting up in chair and feeling well overall. Heart rate is elevated in 120s which was noted during PT prior to my visit. He denies associated symptoms - no palpatations, dizziness, chest pain. Has had 2-3 bloody Bms yesterday evening/night. BM this am as well but did not look at it. No nausea or emesis. Some soreness over ventral hernia  Brother at bedside  Objective: Vital signs in last 24 hours: Temp:  [97 F (36.1 C)-98.1 F (36.7 C)] 97.6 F (36.4 C) (04/15 0303) Pulse Rate:  [61-115] 115 (04/15 0738) Resp:  [14-23] 16 (04/15 0738) BP: (129-156)/(66-88) 149/88 (04/15 0738) SpO2:  [98 %-100 %] 100 % (04/15 0738) Weight:  [58.7 kg] 58.7 kg (04/15 0303) Last BM Date : 11/22/22  Intake/Output from previous day: 04/14 0701 - 04/15 0700 In: 1765.7 [I.V.:1377.7; Blood:388] Out: 600 [Urine:600] Intake/Output this shift: No intake/output data recorded.  PE: General: pleasant, WD, male who is sitting up in chair in NAD Heart: tachycardic in 120s Lungs: Respiratory effort nonlabored on room air Abd: soft, ND, +BS, mild TTP over ventral hernia which is soft and reducible. Incision left groin with staples c/d/i MSK: all 4 extremities are symmetrical with no cyanosis, clubbing, or edema. Skin: warm and dry Psych: A&Ox3 with an appropriate affect.    Lab Results:  Recent Labs    11/22/22 2149 11/23/22 0801  WBC 6.6 6.4  HGB 8.7* 8.7*  HCT 26.0* 26.4*  PLT 44* 61*   BMET Recent Labs    11/21/22 0101 11/21/22 0405 11/22/22 0159 11/22/22 1029  NA 136  --  134*  --   K 6.4*   < > 6.2* 5.4*  CL 112*  --  112*  --   CO2 15*  --  15*  --   GLUCOSE 159*  --  100*  --   BUN 26*  --  38*  --   CREATININE 1.51*  --  1.81*  --   CALCIUM 8.1*  --  7.3*  --    < > = values in this interval not displayed.   PT/INR Recent Labs    11/20/22 1626 11/22/22 2149  LABPROT 16.9* 16.4*  INR 1.4* 1.3*   CMP     Component Value  Date/Time   NA 134 (L) 11/22/2022 0159   NA 138 06/18/2022 1545   K 5.4 (H) 11/22/2022 1029   CL 112 (H) 11/22/2022 0159   CO2 15 (L) 11/22/2022 0159   GLUCOSE 100 (H) 11/22/2022 0159   BUN 38 (H) 11/22/2022 0159   BUN 13 06/18/2022 1545   CREATININE 1.81 (H) 11/22/2022 0159   CREATININE 3.07 (HH) 06/05/2022 1505   CALCIUM 7.3 (L) 11/22/2022 0159   PROT 6.9 11/20/2022 1419   PROT 6.5 04/20/2022 1106   ALBUMIN 2.8 (L) 11/20/2022 1419   ALBUMIN 3.0 (L) 04/20/2022 1106   AST 29 11/20/2022 1419   AST 28 06/05/2022 1505   ALT 21 11/20/2022 1419   ALT 21 06/05/2022 1505   ALKPHOS 112 11/20/2022 1419   BILITOT 1.1 11/20/2022 1419   BILITOT 1.4 (H) 06/05/2022 1505   GFRNONAA 36 (L) 11/22/2022 0159   GFRNONAA 19 (L) 06/05/2022 1505   GFRAA 40 (L) 08/08/2020 1430   Lipase     Component Value Date/Time   LIPASE 40 11/20/2022 1419       Studies/Results: CT ABDOMEN PELVIS WO CONTRAST  Result Date: 11/22/2022 CLINICAL DATA:  Abdominal pain, postop incarcerated LEFT  inguinal hernia status post surgical repair on 11/20/2022. Acute drop in hemoglobin. Rule out hematoma. EXAM: CT ABDOMEN AND PELVIS WITHOUT CONTRAST TECHNIQUE: Multidetector CT imaging of the abdomen and pelvis was performed following the standard protocol without IV contrast. RADIATION DOSE REDUCTION: This exam was performed according to the departmental dose-optimization program which includes automated exposure control, adjustment of the mA and/or kV according to patient size and/or use of iterative reconstruction technique. COMPARISON:  CT abdomen and pelvis dated 11/20/2022. FINDINGS: Lower chest: Mild bibasilar atelectasis. Hepatobiliary: No focal liver abnormality is seen. Cholelithiasis. No bile duct dilatation is seen. Pancreas: Unremarkable. No pancreatic ductal dilatation or surrounding inflammatory changes. Spleen: Normal in size without focal abnormality. Adrenals/Urinary Tract: Kidneys are unremarkable without  suspicious mass, stone or hydronephrosis. No ureteral or bladder calculi are identified. Stomach/Bowel: No dilated large or small bowel loops. Gas and oral contrast is seen throughout the nondistended large bowel to the rectum. Vascular/Lymphatic: No abdominal aortic aneurysm. No enlarged lymph nodes are seen in the abdomen or pelvis. Reproductive: Prostate is unremarkable. Other: Moderate amount of ascites within the abdomen and pelvis, similar in amount when compared to the preoperative CT of 11/20/2022. No evidence of acute hematoma within the abdomen or pelvis. Surgical changes of LEFT inguinal hernia repair, with staples overlying the LEFT inguinal region. Additional surgical changes are suspected within the RIGHT lower quadrant, corresponding to the op note's description of small-bowel resection with anastomosis. Musculoskeletal: No acute-appearing osseous abnormality. IMPRESSION: 1. Moderate amount of ascites within the abdomen and pelvis, similar in amount when compared to the preoperative CT of 11/20/2022. 2. No evidence of acute hematoma within the abdomen or pelvis. 3. Surgical changes of LEFT inguinal hernia repair, with staples overlying the LEFT inguinal region. Additional surgical changes are suspected within the RIGHT lower quadrant, corresponding to the op note's description of small-bowel resection with anastomosis. 4. Cholelithiasis without evidence of acute cholecystitis. Electronically Signed   By: Bary Richard M.D.   On: 11/22/2022 16:08    Anti-infectives: Anti-infectives (From admission, onward)    Start     Dose/Rate Route Frequency Ordered Stop   11/21/22 0600  piperacillin-tazobactam (ZOSYN) IVPB 3.375 g  Status:  Discontinued        3.375 g 12.5 mL/hr over 240 Minutes Intravenous Every 8 hours 11/20/22 1818 11/21/22 1048   11/20/22 1830  piperacillin-tazobactam (ZOSYN) IVPB 3.375 g        3.375 g 100 mL/hr over 30 Minutes Intravenous  Once 11/20/22 1817 11/20/22 1952         Assessment/Plan Left obstructing and gangrenous inguinal hernia  POD3 s/p open Left obstructing and gangrenous inguinal hernia repair, small bowel resection with anastomosis 11/20/2022 Dr. Sheliah Hatch - tachycardic this am - significant drop in hgb with large volume maroon stool 4/14 am. S/p 2 u RBC 4/14. With appropriate response in hgb - 8.7 this am. Suspect SB anastomotic bleed which should be self limiting - thrombocytopenia - plts 61 today. PT INR stable from prior - CT abd/pelvis wo contrast 4/14 with post surgical changes, stable mod ascites, no evidence of acute hematoma. No hemoperitoneum - GI consulted and holding off on colonoscopy for now  Recheck H and H ordered for 1200. Will follow.  FEN: CLD ID: [email or web address] periop VTE: hold in setting of acute bleeding  I reviewed Consultant GI notes, hospitalist notes, last 24 h vitals and pain scores, last 48 h intake and output, last 24 h labs and trends, and last 24 h imaging results.  LOS: 3 days   Eric Form, [facility] Surgery 11/23/2022, 9:02 AM Please see Amion for pager number during day hours 7:00am-4:30pm

## 2022-11-23 NOTE — TOC Initial Note (Signed)
Transition of Care [facility]) - Initial/Assessment Note    Patient Details  Name: Trevor Murphy MRN: [id number] Date of Birth: January 21, 1935  Transition of Care [facility]) CM/SW Contact:    Kermit Balo, RN Phone Number: 11/23/2022, 4:22 PM  Clinical Narrative:                 Pt is from home alone but has family in and out a lot.  His sister provides needed transportation.  His sister also oversee his medications at home. Pharmacy: [facility].  Home health arranged with [facility] Information on the AVS.  BSC ordered through [employer] and will be delivered to the room. Family will provide transport home when discharged.   Expected Discharge Plan: Home w Home Health Services Barriers to Discharge: Continued Medical Work up   Patient Goals and CMS Choice   CMS Medicare.gov Compare Post Acute Care list provided to:: Patient Choice offered to / list presented to : Patient      Expected Discharge Plan and Services   Discharge Planning Services: CM Consult Post Acute Care Choice: Home Health, Durable Medical Equipment Living arrangements for the past 2 months: Apartment                 DME Arranged: Bedside commode DME Agency: [employer] Date DME Agency Contacted: 11/23/22   Representative spoke with at DME Agency: Barbara Cower HH Arranged: PT, OT          Prior Living Arrangements/Services Living arrangements for the past 2 months: Apartment Lives with:: Self              Current home services: DME (Cane/ walker/ wheelchair/ shower seat)    Activities of Daily Living Home Assistive Devices/Equipment: Environmental consultant (specify type), Cane (specify quad or straight) ADL Screening (condition at time of admission) Patient's cognitive ability adequate to safely complete daily activities?: Yes Is the patient deaf or have difficulty hearing?: No Does the patient have difficulty seeing, even when wearing glasses/contacts?: Yes (left eye blind, right eye sees  shadows) Does the patient have difficulty concentrating, remembering, or making decisions?: No Patient able to express need for assistance with ADLs?: Yes Does the patient have difficulty dressing or bathing?: No Independently performs ADLs?: Yes (appropriate for developmental age) Does the patient have difficulty walking or climbing stairs?: Yes Weakness of Legs: Both Weakness of Arms/Hands: None  Permission Sought/Granted                  Emotional Assessment              Admission diagnosis:  Incarcerated hernia [K46.0] Incarcerated left inguinal hernia [K40.30] Patient Active Problem List   Diagnosis Date Noted   Incarcerated left inguinal hernia 11/20/2022   Thrombocytopenia 11/20/2022   Severe mitral regurgitation 11/20/2022   Failure to thrive in adult 09/26/2022   Renal insufficiency 09/26/2022   Pancytopenia 09/24/2022   Weakness 09/23/2022   Lower leg edema 09/23/2022   Abdomen enlarged 09/23/2022   Other ascites 09/23/2022   Inguinal bulge 09/23/2022   Hiatal hernia 09/23/2022   Cirrhosis of liver with ascites 09/23/2022   Leg swelling 07/01/2022   Hematuria, gross 06/18/2022   Status post placement of implantable loop recorder 06/18/2022   History of CVA (cerebrovascular accident) 06/08/2022   Duodenal ulcer, unspecified as acute or chronic, without hemorrhage or perforation 01/09/2022   Iron deficiency anemia 01/09/2022   PVD (peripheral vascular disease) 08/19/2021   Mixed hyperlipidemia 08/12/2020   Acute blood  loss anemia 08/11/2020   Paroxysmal atrial fibrillation 06/07/2020   Secondary hypercoagulable state 06/07/2020   Ischemic cerebrovascular accident (CVA) of frontal lobe 05/20/2019   Pain due to onychomycosis of toenails of both feet 01/20/2019   Prediabetes 07/22/2018   Nail abnormality 07/22/2018   Sinus bradycardia 05/02/2016   Hypotension due to drugs 05/02/2016   History of tachycardia 05/02/2016   GERD (gastroesophageal reflux  disease) 02/05/2015   Coronary artery disease involving native coronary artery of native heart without angina pectoris 04/20/2014   Atypical chest pain 04/12/2014   Hypertensive urgency 03/11/2014   Chronic diastolic CHF (congestive heart failure) 01/30/2014   Alcohol abuse 01/19/2014   Elevated troponin level not due myocardial infarction 12/19/2013   Gout 12/19/2013   Right knee pain 12/19/2013   Dyslipidemia 12/16/2013   Hypertensive crisis 12/16/2013   Cerebral infarction (HCC) 07/28/2012   Acute on chronic renal failure 07/27/2012   Essential hypertension 07/26/2012   Chronic kidney disease, stage 3b 07/26/2012   Poor circulation    Recurrent ventral hernia 02/16/2012   Hernia (acquired) (recurrent) 02/10/2011   PCP:  [name], FNP Pharmacy:   [id number] - [place], [place] - [address] AT [facility] [address] & [address] [place] [address] Phone: [phone] Fax: [phone]     Social Determinants of Health (SDOH) Social History: SDOH Screenings   Food Insecurity: No Food Insecurity (11/20/2022)  Housing: Low Risk  (11/20/2022)  Transportation Needs: No Transportation Needs (11/20/2022)  Utilities: Not At Risk (11/20/2022)  Depression (PHQ2-9): Low Risk  (10/21/2022)  Financial Resource Strain: Low Risk  (10/21/2022)  Physical Activity: Inactive (10/21/2022)  Social Connections: Moderately Isolated (04/15/2021)  Stress: No Stress Concern Present (10/21/2022)  Tobacco Use: Low Risk  (11/21/2022)   SDOH Interventions:     Readmission Risk Interventions     No data to display

## 2022-11-23 NOTE — Care Management Important Message (Signed)
Important Message  Patient Details  Name: Trevor Murphy MRN: [id number] Date of Birth: 1935/06/13   Medicare Important Message Given:  Yes     Renie Ora 11/23/2022, 10:45 AM

## 2022-11-23 NOTE — Progress Notes (Signed)
    Durable Medical Equipment  (From admission, onward)           Start     Ordered   11/23/22 1625  For home use only DME Bedside commode  Once       Question:  Patient needs a bedside commode to treat with the following condition  Answer:  Weakness   11/23/22 1625             Pt requires a bedside commode as he has difficulty making it to the restroom in a timely manner on the level of the home in which he will be staying.

## 2022-11-23 NOTE — Progress Notes (Signed)
PROGRESS NOTE    Trevor Murphy  ZOX:[id number] DOB: [birth year] DOA: 11/20/2022 PCP: [name], FNP   Brief Narrative:  Trevor Murphy is a 88 y.o. male with medical history significant for chronic diastolic CHF (EF 81-19%), CAD, CKD stage IIIb, PAF no longer on anticoagulation, bradycardia, cryptogenic stroke s/p ILR, hepatic cirrhosis with thrombocytopenia, severe mitral regurgitation who presented to the ED for evaluation of abdominal pain with nausea and vomiting.  Was diagnosed with incarcerated left inguinal hernia.  Due to multiple comorbidities, patient was admitted under hospital service and surgery was consulted.  Details below.   Recently hospitalized 09/23/2022-09/28/2022 with decompensated hepatic cirrhosis with ascites.  Required paracentesis with 2.1 L fluid removed.  Started on spironolactone.  He had severe thrombocytopenia and was considered high risk for bleeding therefore after shared decision making Eliquis was discontinued.  Assessment & Plan:   Principal Problem:   Incarcerated left inguinal hernia Active Problems:   Cirrhosis of liver with ascites   Chronic kidney disease, stage 3b   Paroxysmal atrial fibrillation   Chronic diastolic CHF (congestive heart failure)   Essential hypertension   Sinus bradycardia   History of CVA (cerebrovascular accident)   Thrombocytopenia   Severe mitral regurgitation    Incarcerated left inguinal hernia: S/p open left inguinal hernia repair 11/20/2022 by Dr. Sheliah Hatch.  Doing well.  Discussed with general surgery/Dr. Cliffton Asters.  No indication of antibiotics, discontinued Zosyn on 11/21/2022.[name] Kitchen   Paroxysmal atrial fibrillation with sinus bradycardia:  He is off anticoagulation after prior shared decision making on recent admission.  Patient was initially bradycardic but this morning he was tachycardic but in sinus rhythm.  He was asymptomatic.  Monitor for now.  Acute blood loss anemia/bright red blood per rectum: Overnight, on  11/22/2022 his hemoglobin dropped to 5.9 all the way from 10 preoperatively.  There were no reports of GI bleed or any other source of obvious bleeding at that time.  there was some concern of intra-abdominal bleeding for which CT abdomen with oral contrast was done which ruled out any intra-abdominal bleeding.  He then had large episode of bright red blood per rectum.  He was ordered 2 units of PRBC transfusion, posttransfusion hemoglobin improved to 8.7.  GI was consulted.  Initial plan was to do colonoscopy today but that was canceled.  Reportedly, patient has had 2 more episodes of bright red blood per rectum.  Patient clinically appears stable without any complaints.  Interestingly, despite of 2 more episodes of bright red blood per rectum, his hemoglobin has remained stable.  We will recheck at noon.  We are waiting for GI to clarify plans.  I discussed with his son at the bedside and another son over the phone.    Chronic diastolic CHF/severe mitral regurgitation: EF 55-60%.  Does not appear to be in decompensated CHF.  He is on spironolactone and [name] as an outpatient, currently on hold and will continue to hold since he is hyperkalemic.  Hyperkalemia: Now resolved.  Monitor closely.  Patient received several doses of Lokelma, insulin and dextrose.   History of cryptogenic stroke: Has ILR in place, recent review shows episodes of bradycardia as an outpatient.  Off of anticoagulation as above.   Hepatic cirrhosis with ascites: Small to moderate ascites noted on CT imaging.  He is fully alert and oriented.  No paracentesis indicated.   AKI on CKD stage IIIb: Baseline creatinine 1.5.  Developed mild AKI with creatinine 1.81 on 11/22/2022 which resolved and back to baseline  now.   Essential hypertension: BP elevated on admission.  Only on finasteride now.  Aldactone on hold.  Blood pressure controlled.   Chronic thrombocytopenia: Stable. No indication for transfusion.   Bladder lesion: 18  mm polypoid soft tissue lesion posterior bladder base noted on CT imaging.  Potentially median lobe hypertrophy of the prostate gland although urothelial lesion not excluded per radiology read.  Findings discussed with patient's son.  Consider outpatient urology follow-up pending further goals of care.   Pancreatic head lesion: 6 mm hypodensity in pancreatic head noted on CT imaging.  This is felt to be a cyst or pseudocyst per radiology read.  Consider follow-up CT abdomen with contrast in 2 years pending goals of care.  DVT prophylaxis: SCDs Start: 11/20/22 1938   Code Status: DNR  Family Communication: Son present at bedside.  Plan of care discussed with patient in length and he/she verbalized understanding and agreed with it.  Status is: Inpatient Remains inpatient appropriate because: Having GI bleed.  Needs further assessment by GI.   Estimated body mass index is 18.31 kg/m as calculated from the following:   Height as of this encounter: 5' 10.5" (1.791 m).   Weight as of this encounter: 58.7 kg.    Nutritional Assessment: Body mass index is 18.31 kg/m.[name] Kitchen Seen by dietician.  I agree with the assessment and plan as outlined below: Nutrition Status:        . Skin Assessment: I have examined the patient's skin and I agree with the wound assessment as performed by the wound care RN as outlined below:    Consultants:  Surgery  Procedures:  As above  Antimicrobials:  Anti-infectives (From admission, onward)    Start     Dose/Rate Route Frequency Ordered Stop   11/21/22 0600  piperacillin-tazobactam (ZOSYN) IVPB 3.375 g  Status:  Discontinued        3.375 g 12.5 mL/hr over 240 Minutes Intravenous Every 8 hours 11/20/22 1818 11/21/22 1048   11/20/22 1830  piperacillin-tazobactam (ZOSYN) IVPB 3.375 g        3.375 g 100 mL/hr over 30 Minutes Intravenous  Once 11/20/22 1817 11/20/22 1952         Subjective: Patient seen and examined.  Son at the bedside.  Patient has  no complaints.  Objective: Vitals:   11/22/22 2300 11/23/22 0303 11/23/22 0738 11/23/22 1152  BP: 132/67 132/79 (!) 149/88 123/75  Pulse: 77  (!) 115 98  Resp: (!) 23 17 16 15   Temp: 97.7 F (36.5 C) 97.6 F (36.4 C)    TempSrc: Oral Oral    SpO2: 100% 99% 100% 100%  Weight:  58.7 kg    Height:        Intake/Output Summary (Last 24 hours) at 11/23/2022 1158 Last data filed at 11/23/2022 0456 Gross per 24 hour  Intake 1765.66 ml  Output 600 ml  Net 1165.66 ml    Filed Weights   11/20/22 1406 11/21/22 0500 11/23/22 0303  Weight: 61.7 kg 59.5 kg 58.7 kg    Examination: General exam: Appears calm and comfortable  Respiratory system: Clear to auscultation. Respiratory effort normal. Cardiovascular system: S1 & S2 heard, RRR. No JVD, murmurs, rubs, gallops or clicks. No pedal edema. Gastrointestinal system: Abdomen is nondistended, soft and nontender. No organomegaly or masses felt. Normal bowel sounds heard. Central nervous system: Alert and oriented. No focal neurological deficits. Extremities: Symmetric 5 x 5 power. Skin: No rashes, lesions or ulcers.  Psychiatry: Judgement and insight appear normal.  Mood & affect appropriate.    Data Reviewed: I have personally reviewed following labs and imaging studies  CBC: Recent Labs  Lab 11/20/22 1419 11/21/22 0101 11/22/22 0159 11/22/22 0352 11/22/22 1029 11/22/22 1500 11/22/22 2149 11/23/22 0801  WBC 7.0 6.2 8.3  --   --   --  6.6 6.4  NEUTROABS  --   --  6.7  --   --   --  5.2 5.1  HGB 10.6* 10.0* 5.9* 5.7* 8.2* 8.3* 8.7* 8.7*  HCT 33.8* 31.3* 18.2* 17.3*  --  25.4* 26.0* 26.4*  MCV 94.7 93.2 93.3  --   --   --  88.4 89.2  PLT 109* 75* 56*  --   --   --  44* 61*    Basic Metabolic Panel: Recent Labs  Lab 11/20/22 1419 11/21/22 0101 11/21/22 0405 11/21/22 1042 11/21/22 1535 11/22/22 0159 11/22/22 1029 11/23/22 0801  NA 137 136  --   --   --  134*  --  138  K 5.1 6.4*   < > 6.2* 5.4* 6.2* 5.4* 5.0  CL 113*  112*  --   --   --  112*  --  115*  CO2 17* 15*  --   --   --  15*  --  19*  GLUCOSE 189* 159*  --   --   --  100*  --  105*  BUN 26* 26*  --   --   --  38*  --  32*  CREATININE 1.60* 1.51*  --   --   --  1.81*  --  1.57*  CALCIUM 8.2* 8.1*  --   --   --  7.3*  --  7.5*   < > = values in this interval not displayed.    GFR: Estimated Creatinine Clearance: 28.6 mL/min (A) (by C-G formula based on SCr of 1.57 mg/dL (H)). Liver Function Tests: Recent Labs  Lab 11/20/22 1419  AST 29  ALT 21  ALKPHOS 112  BILITOT 1.1  PROT 6.9  ALBUMIN 2.8*    Recent Labs  Lab 11/20/22 1419  LIPASE 40    No results for input(s): "AMMONIA" in the last 168 hours. Coagulation Profile: Recent Labs  Lab 11/20/22 1626 11/22/22 2149  INR 1.4* 1.3*    Cardiac Enzymes: No results for input(s): "CKTOTAL", "CKMB", "CKMBINDEX", "TROPONINI" in the last 168 hours. BNP (last 3 results) No results for input(s): "PROBNP" in the last 8760 hours. HbA1C: No results for input(s): "HGBA1C" in the last 72 hours. CBG: No results for input(s): "GLUCAP" in the last 168 hours. Lipid Profile: No results for input(s): "CHOL", "HDL", "LDLCALC", "TRIG", "CHOLHDL", "LDLDIRECT" in the last 72 hours. Thyroid Function Tests: No results for input(s): "TSH", "T4TOTAL", "FREET4", "T3FREE", "THYROIDAB" in the last 72 hours. Anemia Panel: No results for input(s): "VITAMINB12", "FOLATE", "FERRITIN", "TIBC", "IRON", "RETICCTPCT" in the last 72 hours. Sepsis Labs: Recent Labs  Lab 11/20/22 1621 11/21/22 0101 11/21/22 1042 11/21/22 1735  LATICACIDVEN 3.3* 2.8* 4.8* 1.9     No results found for this or any previous visit (from the past 240 hour(s)).   Radiology Studies: CT ABDOMEN PELVIS WO CONTRAST  Result Date: 11/22/2022 CLINICAL DATA:  Abdominal pain, postop incarcerated LEFT inguinal hernia status post surgical repair on 11/20/2022. Acute drop in hemoglobin. Rule out hematoma. EXAM: CT ABDOMEN AND PELVIS  WITHOUT CONTRAST TECHNIQUE: Multidetector CT imaging of the abdomen and pelvis was performed following the standard protocol without IV contrast. RADIATION DOSE  REDUCTION: This exam was performed according to the departmental dose-optimization program which includes automated exposure control, adjustment of the mA and/or kV according to patient size and/or use of iterative reconstruction technique. COMPARISON:  CT abdomen and pelvis dated 11/20/2022. FINDINGS: Lower chest: Mild bibasilar atelectasis. Hepatobiliary: No focal liver abnormality is seen. Cholelithiasis. No bile duct dilatation is seen. Pancreas: Unremarkable. No pancreatic ductal dilatation or surrounding inflammatory changes. Spleen: Normal in size without focal abnormality. Adrenals/Urinary Tract: Kidneys are unremarkable without suspicious mass, stone or hydronephrosis. No ureteral or bladder calculi are identified. Stomach/Bowel: No dilated large or small bowel loops. Gas and oral contrast is seen throughout the nondistended large bowel to the rectum. Vascular/Lymphatic: No abdominal aortic aneurysm. No enlarged lymph nodes are seen in the abdomen or pelvis. Reproductive: Prostate is unremarkable. Other: Moderate amount of ascites within the abdomen and pelvis, similar in amount when compared to the preoperative CT of 11/20/2022. No evidence of acute hematoma within the abdomen or pelvis. Surgical changes of LEFT inguinal hernia repair, with staples overlying the LEFT inguinal region. Additional surgical changes are suspected within the RIGHT lower quadrant, corresponding to the op note's description of small-bowel resection with anastomosis. Musculoskeletal: No acute-appearing osseous abnormality. IMPRESSION: 1. Moderate amount of ascites within the abdomen and pelvis, similar in amount when compared to the preoperative CT of 11/20/2022. 2. No evidence of acute hematoma within the abdomen or pelvis. 3. Surgical changes of LEFT inguinal hernia  repair, with staples overlying the LEFT inguinal region. Additional surgical changes are suspected within the RIGHT lower quadrant, corresponding to the op note's description of small-bowel resection with anastomosis. 4. Cholelithiasis without evidence of acute cholecystitis. Electronically Signed   By: Bary Richard M.D.   On: 11/22/2022 16:08    Scheduled Meds:  brinzolamide  1 drop Both Eyes TID   And   brimonidine  1 drop Both Eyes TID   finasteride  5 mg Oral Daily   latanoprost  1 drop Both Eyes QHS   sodium chloride flush  3 mL Intravenous Q12H   Continuous Infusions:  sodium chloride 100 mL/hr at 11/23/22 0457     LOS: 3 days   Hughie Closs, MD [facility]  11/23/2022, 11:58 AM   *Please note that this is a verbal dictation therefore any spelling or grammatical errors are due to the "Dragon [employer] One" system interpretation.  Please page via Amion and do not message via secure chat for urgent patient care matters. Secure chat can be used for non urgent patient care matters.  How to contact the [facility] Attending or Consulting provider 7A - 7P or covering provider during after hours 7P -7A, for this patient?  Check the care team in [facility] and look for a) attending/consulting [facility] provider listed and b) the [facility] team listed. Page or secure chat 7A-7P. Log into [email or web address] and use 's universal password to access. If you do not have the password, please contact the hospital operator. Locate the [facility] provider you are looking for under [facility] and page to a number that you can be directly reached. If you still have difficulty reaching the provider, please page the [facility] (Director on Call) for the Hospitalists listed on amion for assistance.

## 2022-11-23 NOTE — Progress Notes (Signed)
Mobility Specialist: Progress Note   11/23/22 1542  Mobility  Activity Ambulated with assistance in hallway  Level of Assistance Contact guard assist, steadying assist  Assistive Device Front wheel walker  Distance Ambulated (ft) 130 ft  Activity Response Tolerated well  Mobility Referral Yes  $Mobility charge 1 Mobility   Pre-Mobility: 111 HR, 100% SpO2 During Mobility: 138 HR Post-Mobility: 120 HR, 100% SpO2  Pt received in the bed and agreeable to mobility. Mod I with bed mobility and contact guard during ambulation. No c/o throughout. Pt to the chair after session with call bell at his side and pt's brother present in the room.   Bradley Bostelman Mobility Specialist Please contact via SecureChat or Rehab office at [phone]

## 2022-11-23 NOTE — Evaluation (Signed)
Occupational Therapy Evaluation Patient Details Name: Trevor Murphy MRN: [id number] DOB: 1934-10-07 Today's Date: 11/23/2022   History of Present Illness 88 y/o male admitted for incarcerated left inguinal hernia and underwent surgical repair on 11/20/2022. Hgb dropped to 5.8 and pt given 2 unit PRBC, 11/22/22 Medical history significant for chronic diastolic CHF (EF 82-95%), CAD, CKD stage IIIb, PAF, bradycardia, cryptogenic stroke s/p ILR, hepatic cirrhosis with thrombocytopenia, severe mitral regurgitation, impaired vision.   Clinical Impression   Pt reports having assist at baseline for IADLs and uses RW for mobility, lives alone but has family assist PRN. Pt needing min -mod A for ADLs, min guard for bed mobility, and min A for transfers with RW. Pt HR up to 138bpm with short distance ambulation in room, however asymptomatic. Pt presenting with impairments listed below, will follow acutely. Recommend HHOT at d/c.      Recommendations for follow up therapy are one component of a multi-disciplinary discharge planning process, led by the attending physician.  Recommendations may be updated based on patient status, additional functional criteria and insurance authorization.   Assistance Recommended at Discharge Intermittent Supervision/Assistance  Patient can return home with the following A little help with walking and/or transfers;A little help with bathing/dressing/bathroom;Assistance with cooking/housework;Direct supervision/assist for financial management;Direct supervision/assist for medications management;Help with stairs or ramp for entrance    Functional Status Assessment  Patient has had a recent decline in their functional status and demonstrates the ability to make significant improvements in function in a reasonable and predictable amount of time.  Equipment Recommendations  BSC/3in1    Recommendations for Other Services PT consult     Precautions / Restrictions  Precautions Precautions: Fall Restrictions Weight Bearing Restrictions: No      Mobility Bed Mobility Overal bed mobility: Needs Assistance Bed Mobility: Supine to Sit     Supine to sit: Min guard     General bed mobility comments: cues to scoot to EOB    Transfers Overall transfer level: Needs assistance Equipment used: Rolling walker (2 wheels) Transfers: Sit to/from Stand Sit to Stand: Min assist                  Balance Overall balance assessment: Needs assistance Sitting-balance support: Feet supported, Feet unsupported, Bilateral upper extremity supported, Single extremity supported Sitting balance-Leahy Scale: Fair     Standing balance support: Bilateral upper extremity supported, During functional activity, Reliant on assistive device for balance Standing balance-Leahy Scale: Poor                             ADL either performed or assessed with clinical judgement   ADL Overall ADL's : Needs assistance/impaired Eating/Feeding: Set up   Grooming: Min guard;Sitting;Standing   Upper Body Bathing: Minimal assistance   Lower Body Bathing: Moderate assistance   Upper Body Dressing : Minimal assistance   Lower Body Dressing: Moderate assistance   Toilet Transfer: Ambulation;Rolling walker (2 wheels);Minimal assistance   Toileting- Clothing Manipulation and Hygiene: Minimal assistance       Functional mobility during ADLs: Moderate assistance;Rolling walker (2 wheels)       Vision Baseline Vision/History: 2 Legally blind Patient Visual Report: No change from baseline Additional Comments: reports unable to read number on wall, unable to tell what color scrubs therapist is wearing     Perception Perception Perception Tested?: No   Praxis Praxis Praxis tested?: Not tested    Pertinent Vitals/Pain Pain Assessment Pain Assessment: No/denies  pain     Hand Dominance Right   Extremity/Trunk Assessment Upper Extremity  Assessment Upper Extremity Assessment: Generalized weakness   Lower Extremity Assessment Lower Extremity Assessment: Defer to PT evaluation   Cervical / Trunk Assessment Cervical / Trunk Assessment: Kyphotic   Communication Communication Communication: HOH   Cognition Arousal/Alertness: Awake/alert Behavior During Therapy: WFL for tasks assessed/performed Overall Cognitive Status: Within Functional Limits for tasks assessed                                       General Comments  HR up to 138 with short distance mobility    Exercises     Shoulder Instructions      Home Living Family/patient expects to be discharged to:: Private residence Living Arrangements: Alone Available Help at Discharge: Family;Available 24 hours/day Type of Home: Apartment Home Access: Level entry     Home Layout: One level     Bathroom Shower/Tub: Tub/shower unit     Bathroom Accessibility: Yes   Home Equipment: Tub bench;Toilet riser;Grab bars - toilet;Grab bars - tub/shower;Wheelchair - Counsellor (4 wheels)          Prior Functioning/Environment Prior Level of Function : Needs assist             Mobility Comments: walks with rollator in home, wheelchair for going to MD appointments ADLs Comments: family assists daily with meals and IADLs, pt reports being able to bathe and dress himself. reports his sister assists with med mgmt, utilizes pillbox        OT Problem List: Decreased strength;Decreased range of motion;Decreased activity tolerance;Impaired balance (sitting and/or standing);Impaired vision/perception;Decreased safety awareness;Cardiopulmonary status limiting activity      OT Treatment/Interventions: Self-care/ADL training;Therapeutic exercise;Energy conservation;DME and/or AE instruction;Therapeutic activities;Patient/family education;Balance training    OT Goals(Current goals can be found in the care plan section) Acute Rehab OT  Goals Patient Stated Goal: none stated OT Goal Formulation: With patient Time For Goal Achievement: 12/07/22 Potential to Achieve Goals: Good ADL Goals Pt Will Perform Upper Body Dressing: with modified independence;sitting Pt Will Perform Lower Body Dressing: with modified independence;sitting/lateral leans;sit to/from stand Pt Will Transfer to Toilet: with modified independence;ambulating;regular height toilet Pt Will Perform Tub/Shower Transfer: Tub transfer;Shower transfer;with modified independence;ambulating;shower seat  OT Frequency: Min 2X/week    Co-evaluation              AM-PAC OT "6 Clicks" Daily Activity     Outcome Measure Help from another person eating meals?: None Help from another person taking care of personal grooming?: A Little Help from another person toileting, which includes using toliet, bedpan, or urinal?: A Little Help from another person bathing (including washing, rinsing, drying)?: A Lot Help from another person to put on and taking off regular upper body clothing?: A Little Help from another person to put on and taking off regular lower body clothing?: A Lot 6 Click Score: 17   End of Session Equipment Utilized During Treatment: Gait belt;Rolling walker (2 wheels) Nurse Communication: Mobility status  Activity Tolerance: Patient tolerated treatment well Patient left: in bed;with call bell/phone within reach;with bed alarm set;with family/visitor present  OT Visit Diagnosis: Unsteadiness on feet (R26.81);Other abnormalities of gait and mobility (R26.89);Muscle weakness (generalized) (M62.81)                Time: 1610-9604 OT Time Calculation (min): 18 min Charges:  OT General Charges $OT Visit: 1 Visit  OT Evaluation $OT Eval Moderate Complexity: 1 Mod  Charday Capetillo K, OTD, OTR/L SecureChat Preferred Acute Rehab [phone]   Dalphine Handing 11/23/2022, 12:40 PM

## 2022-11-23 NOTE — Progress Notes (Signed)
Subjective: Patient is a 88 year old black male with multiple medical problems who was admitted for an incarcerated left inguinal hernia which operated on 11/19/2021 and subsequently patient had large-volume hematochezia with drop in his hemoglobin for which he received 2 units of blood was suspected to be postoperative bleed but the CT scan did not show any hemoperitoneum. Patient vitals have been stable since yesterday even though he is passed some whole blood with BMs today. He denies having any abdominal pain and he has been hemodynamically stable throughout the day..  Objective: Vital signs in last 24 hours: Temp:  [97 F (36.1 C)-98.3 F (36.8 C)] 97.6 F (36.4 C) (04/15 0303) Pulse Rate:  [61-97] 77 (04/14 2300) Resp:  [14-23] 17 (04/15 0303) BP: (129-156)/(43-79) 132/79 (04/15 0303) SpO2:  [98 %-100 %] 99 % (04/15 0303) Weight:  [58.7 kg] 58.7 kg (04/15 0303) Last BM Date : 11/22/22  Intake/Output from previous day: 04/14 0701 - 04/15 0700 In: 1765.7 [I.V.:1377.7; Blood:388] Out: 600 [Urine:600] Intake/Output this shift: No intake/output data recorded.  General appearance: alert, cooperative, appears stated age, and no distress Resp: clear to auscultation bilaterally except for rectal bleeding with posthemorrhagic anemia after open repair of occasional rhonchi Cardio: regular rate and rhythm, S1, S2 normal, no murmur, click, rub or gallop GI: abnormal findings:  distended with normal bowel sounds  Lab Results: Recent Labs    11/21/22 0101 11/22/22 0159 11/22/22 0352 11/22/22 1029 11/22/22 1500 11/22/22 2149  WBC 6.2 8.3  --   --   --  6.6  HGB 10.0* 5.9* 5.7* 8.2* 8.3* 8.7*  HCT 31.3* 18.2* 17.3*  --  25.4* 26.0*  PLT 75* 56*  --   --   --  44*   BMET Recent Labs    11/20/22 1419 11/21/22 0101 11/21/22 0405 11/21/22 1535 11/22/22 0159 11/22/22 1029  NA 137 136  --   --  134*  --   K 5.1 6.4*   < > 5.4* 6.2* 5.4*  CL 113* 112*  --   --  112*  --   CO2 17*  15*  --   --  15*  --   GLUCOSE 189* 159*  --   --  100*  --   BUN 26* 26*  --   --  38*  --   CREATININE 1.60* 1.51*  --   --  1.81*  --   CALCIUM 8.2* 8.1*  --   --  7.3*  --    < > = values in this interval not displayed.   LFT Recent Labs    11/20/22 1419  PROT 6.9  ALBUMIN 2.8*  AST 29  ALT 21  ALKPHOS 112  BILITOT 1.1   PT/INR Recent Labs    11/20/22 1626 11/22/22 2149  LABPROT 16.9* 16.4*  INR 1.4* 1.3*   Studies/Results: CT ABDOMEN PELVIS WO CONTRAST  Result Date: 11/22/2022 CLINICAL DATA:  Abdominal pain, postop incarcerated LEFT inguinal hernia status post surgical repair on 11/20/2022. Acute drop in hemoglobin. Rule out hematoma. EXAM: CT ABDOMEN AND PELVIS WITHOUT CONTRAST TECHNIQUE: Multidetector CT imaging of the abdomen and pelvis was performed following the standard protocol without IV contrast. RADIATION DOSE REDUCTION: This exam was performed according to the departmental dose-optimization program which includes automated exposure control, adjustment of the mA and/or kV according to patient size and/or use of iterative reconstruction technique. COMPARISON:  CT abdomen and pelvis dated 11/20/2022. FINDINGS: Lower chest: Mild bibasilar atelectasis. Hepatobiliary: No focal liver abnormality is seen.  Cholelithiasis. No bile duct dilatation is seen. Pancreas: Unremarkable. No pancreatic ductal dilatation or surrounding inflammatory changes. Spleen: Normal in size without focal abnormality. Adrenals/Urinary Tract: Kidneys are unremarkable without suspicious mass, stone or hydronephrosis. No ureteral or bladder calculi are identified. Stomach/Bowel: No dilated large or small bowel loops. Gas and oral contrast is seen throughout the nondistended large bowel to the rectum. Vascular/Lymphatic: No abdominal aortic aneurysm. No enlarged lymph nodes are seen in the abdomen or pelvis. Reproductive: Prostate is unremarkable. Other: Moderate amount of ascites within the abdomen and  pelvis, similar in amount when compared to the preoperative CT of 11/20/2022. No evidence of acute hematoma within the abdomen or pelvis. Surgical changes of LEFT inguinal hernia repair, with staples overlying the LEFT inguinal region. Additional surgical changes are suspected within the RIGHT lower quadrant, corresponding to the op note's description of small-bowel resection with anastomosis. Musculoskeletal: No acute-appearing osseous abnormality. IMPRESSION: 1. Moderate amount of ascites within the abdomen and pelvis, similar in amount when compared to the preoperative CT of 11/20/2022. 2. No evidence of acute hematoma within the abdomen or pelvis. 3. Surgical changes of LEFT inguinal hernia repair, with staples overlying the LEFT inguinal region. Additional surgical changes are suspected within the RIGHT lower quadrant, corresponding to the op note's description of small-bowel resection with anastomosis. 4. Cholelithiasis without evidence of acute cholecystitis. Electronically Signed   By: Bary Richard M.D.   On: 11/22/2022 16:08    Medications: I have reviewed the patient's current medications. Prior to Admission:  Medications Prior to Admission  Medication Sig Dispense Refill Last Dose   acetaminophen (TYLENOL) 500 MG tablet Take 1,000 mg by mouth 2 (two) times daily as needed for mild pain or moderate pain (leg pain).   Past Week   dapagliflozin propanediol ([name]) 10 MG TABS tablet Take 1 tablet (10 mg total) by mouth daily before breakfast. 30 tablet 2 Past Week   ferrous sulfate (FEROSUL) 325 (65 FE) MG tablet TAKE 1 TABLET(325 MG) BY MOUTH DAILY 90 tablet 1 Past Week   finasteride (PROSCAR) 5 MG tablet Take 5 mg by mouth daily.   Past Week   LUMIGAN 0.01 % SOLN Place 1 drop into both eyes at bedtime.   Past Week   SIMBRINZA 1-0.2 % SUSP Follow up with your PCP on when to restart this medication. (Patient taking differently: Place 1 drop into both eyes 3 (three) times daily.)   Past Week    spironolactone (ALDACTONE) 50 MG tablet Take 1 tablet (50 mg total) by mouth daily. 30 tablet 3 Past Week   Scheduled:  brinzolamide  1 drop Both Eyes TID   And   brimonidine  1 drop Both Eyes TID   finasteride  5 mg Oral Daily   latanoprost  1 drop Both Eyes QHS   sodium chloride flush  3 mL Intravenous Q12H   Continuous:  sodium chloride 100 mL/hr at 11/23/22 0457   ZOX:[id number] **OR** acetaminophen, hydrALAZINE, HYDROmorphone (DILAUDID) injection, ondansetron **OR** ondansetron (ZOFRAN) IV, traMADol  Assessment/Plan: 1) Rectal bleeding with posthemorrhagic anemia after open repair of left incarcerated inguinal hernia-I suspect the patient is having a diverticular bleed and therefore CTA might be more helpful in this case have discussed this with Dr. Beverly Gust and the patient has acute drop in his hemoglobin or has hypotension with tachycardia a CTA might be helpful.  I spoke to the patient's nurse Arlys John who mentioned in her chart that he was trying to get in touch with me all day  but unfortunately was trying to contact the [facility] office and did not have the right office number to call.  2) Cirrhosis with ascites and thrombocytopenia. 3) Pancreatic head lesion on CT thought to be benign cyst. 4) Paroxysmal atrial fibrillation with sinus bradycardia/chronic to CHF with severe MR. 5) Acute kidney injury on stage IIIb chronic kidney disease. 6) History of cryptogenic stroke.  LOS: 3 days   Charna Elizabeth 11/23/2022, 7:20 AM

## 2022-11-23 NOTE — Evaluation (Signed)
Physical Therapy Evaluation Patient Details Name: Trevor Murphy MRN: [id number] DOB: 08-18-1934 Today's Date: 11/23/2022  History of Present Illness  88 y/o male admitted for incarcerated left inguinal hernia and underwent surgical repair on 11/20/2022. Hgb dropped to 5.8 and pt given 2 unit PRBC, 11/22/22 Medical history significant for chronic diastolic CHF (EF 40-98%), CAD, CKD stage IIIb, PAF, bradycardia, cryptogenic stroke s/p ILR, hepatic cirrhosis with thrombocytopenia, severe mitral regurgitation, impaired vision.  Clinical Impression  PTA pt living in single story apartment with level entry. Pt reports his nephew spends the night with him and his son and sister provide meals, medication management and transportation. Pt reports that he is able to bathe and dress with set up and that he walks with rollator in the home and uses a wheelchair for community mobility. Pt mobility is limited by his visual impairments and increased HR (max 148bpm with ambulation to door), in presence of generalized weakness. Pt is light minA for bed mobility, transfers and ambulation with RW. Pt will likely do best in his home environment with 24 hour care initially. PT will continue to follow acutely.         Recommendations for follow up therapy are one component of a multi-disciplinary discharge planning process, led by the attending physician.  Recommendations may be updated based on patient status, additional functional criteria and insurance authorization.     Assistance Recommended at Discharge Frequent or constant Supervision/Assistance  Patient can return home with the following  A little help with walking and/or transfers;A little help with bathing/dressing/bathroom;Assistance with cooking/housework;Direct supervision/assist for medications management;Direct supervision/assist for financial management;Assist for transportation;Help with stairs or ramp for entrance    Equipment Recommendations None  recommended by PT     Functional Status Assessment Patient has had a recent decline in their functional status and demonstrates the ability to make significant improvements in function in a reasonable and predictable amount of time.     Precautions / Restrictions Precautions Precautions: Fall Restrictions Weight Bearing Restrictions: No      Mobility  Bed Mobility Overal bed mobility: Needs Assistance Bed Mobility: Supine to Sit     Supine to sit: HOB elevated, Min assist     General bed mobility comments: pt able to manage LE to EoB and bring trunk to upright, requires minA  for pad scoot of hips to EoB    Transfers Overall transfer level: Needs assistance Equipment used: Rolling walker (2 wheels) Transfers: Sit to/from Stand Sit to Stand: Min assist           General transfer comment: good power up, light min A for steadying in RW, slight dizziness which dissipates quickly    Ambulation/Gait Ambulation/Gait assistance: Min guard, Min assist Gait Distance (Feet): 30 Feet Assistive device: Rolling walker (2 wheels) Gait Pattern/deviations: Step-through pattern, WFL(Within Functional Limits), Decreased step length - right, Decreased step length - left, Shuffle Gait velocity: slowed Gait velocity interpretation: <1.31 ft/sec, indicative of household ambulator   General Gait Details: min A for navigation due to visual deficits, min guard for straight line ambulation        Balance Overall balance assessment: Needs assistance Sitting-balance support: Feet supported, Feet unsupported, Bilateral upper extremity supported, Single extremity supported Sitting balance-Leahy Scale: Fair     Standing balance support: Bilateral upper extremity supported, During functional activity, Reliant on assistive device for balance Standing balance-Leahy Scale: Poor  Pertinent Vitals/Pain Pain Assessment Pain Assessment: 0-10 Pain  Score: 8  Pain Location: abdominal surgical site Pain Descriptors / Indicators: Aching, Sore, Grimacing, Guarding Pain Intervention(s): Limited activity within patient's tolerance, Monitored during session, Repositioned    Home Living Family/patient expects to be discharged to:: Private residence Living Arrangements: Alone Available Help at Discharge: Family;Available 24 hours/day Type of Home: Apartment Home Access: Level entry       Home Layout: One level Home Equipment: Tub bench;Toilet riser;Grab bars - toilet;Grab bars - tub/shower;Wheelchair - Counsellor (4 wheels)      Prior Function Prior Level of Function : Needs assist             Mobility Comments: walks with rollator in home, wheelchair for going to MD appointments ADLs Comments: family assists daily with meals and IADLs, pt reports being able to bathe and dress himself     Hand Dominance   Dominant Hand: Right    Extremity/Trunk Assessment   Upper Extremity Assessment Upper Extremity Assessment: Defer to OT evaluation    Lower Extremity Assessment Lower Extremity Assessment: Overall WFL for tasks assessed;Generalized weakness    Cervical / Trunk Assessment Cervical / Trunk Assessment: Kyphotic  Communication   Communication: HOH  Cognition Arousal/Alertness: Awake/alert Behavior During Therapy: WFL for tasks assessed/performed Overall Cognitive Status: Within Functional Limits for tasks assessed                                          General Comments General comments (skin integrity, edema, etc.): ambulation duration limited by HR increase to 148bpm with ambulation to door, with return to seated HR back to 70s. SpO2 > 90% O2        Assessment/Plan    PT Assessment Patient needs continued PT services  PT Problem List Decreased activity tolerance;Decreased mobility;Cardiopulmonary status limiting activity;Pain       PT Treatment Interventions DME  instruction;Gait training;Functional mobility training;Therapeutic activities;Therapeutic exercise;Balance training;Cognitive remediation;Patient/family education    PT Goals (Current goals can be found in the Care Plan section)  Acute Rehab PT Goals Patient Stated Goal: get home PT Goal Formulation: With patient Time For Goal Achievement: 12/07/22 Potential to Achieve Goals: Good    Frequency Min 1X/week        AM-PAC PT "6 Clicks" Mobility  Outcome Measure Help needed turning from your back to your side while in a flat bed without using bedrails?: None Help needed moving from lying on your back to sitting on the side of a flat bed without using bedrails?: A Little Help needed moving to and from a bed to a chair (including a wheelchair)?: A Little Help needed standing up from a chair using your arms (e.g., wheelchair or bedside chair)?: A Little Help needed to walk in hospital room?: A Little Help needed climbing 3-5 steps with a railing? : A Lot 6 Click Score: 18    End of Session Equipment Utilized During Treatment: Gait belt Activity Tolerance: Treatment limited secondary to medical complications (Comment) (increased HR with ambulation) Patient left: in chair;with call bell/phone within reach;with chair alarm set Nurse Communication: Mobility status;Other (comment) (increased HR with ambulation) PT Visit Diagnosis: Unsteadiness on feet (R26.81);Muscle weakness (generalized) (M62.81);Pain Pain - part of body:  (abdominal)    Time: 6582-6088 PT Time Calculation (min) (ACUTE ONLY): 22 min   Charges:   PT Evaluation $PT Eval Moderate Complexity: 1 Mod  Orchid Glassberg B. Beverely Risen PT, DPT Acute Rehabilitation Services Please use secure chat or  Call Office [phone]   [facility]: Lambert Rhonda W 11/23/2022, 9:17 AM

## 2022-11-24 DIAGNOSIS — K4030 Unilateral inguinal hernia, with obstruction, without gangrene, not specified as recurrent: Secondary | ICD-10-CM | POA: Diagnosis not present

## 2022-11-24 LAB — CBC WITH DIFFERENTIAL/PLATELET
Abs Immature Granulocytes: 0.01 10*3/uL (ref 0.00–0.07)
Abs Immature Granulocytes: 0.03 10*3/uL (ref 0.00–0.07)
Basophils Absolute: 0 10*3/uL (ref 0.0–0.1)
Basophils Absolute: 0 10*3/uL (ref 0.0–0.1)
Basophils Relative: 0 %
Basophils Relative: 0 %
Eosinophils Absolute: 0.1 10*3/uL (ref 0.0–0.5)
Eosinophils Absolute: 0.2 10*3/uL (ref 0.0–0.5)
Eosinophils Relative: 4 %
Eosinophils Relative: 5 %
HCT: 22 % — ABNORMAL LOW (ref 39.0–52.0)
HCT: 24.8 % — ABNORMAL LOW (ref 39.0–52.0)
Hemoglobin: 7.5 g/dL — ABNORMAL LOW (ref 13.0–17.0)
Hemoglobin: 8.1 g/dL — ABNORMAL LOW (ref 13.0–17.0)
Immature Granulocytes: 0 %
Immature Granulocytes: 1 %
Lymphocytes Relative: 10 %
Lymphocytes Relative: 9 %
Lymphs Abs: 0.4 10*3/uL — ABNORMAL LOW (ref 0.7–4.0)
Lymphs Abs: 0.4 10*3/uL — ABNORMAL LOW (ref 0.7–4.0)
MCH: 30.2 pg (ref 26.0–34.0)
MCH: 29.5 pg (ref 26.0–34.0)
MCHC: 34.1 g/dL (ref 30.0–36.0)
MCHC: 32.7 g/dL (ref 30.0–36.0)
MCV: 88.7 fL (ref 80.0–100.0)
MCV: 90.2 fL (ref 80.0–100.0)
Monocytes Absolute: 0.3 10*3/uL (ref 0.1–1.0)
Monocytes Absolute: 0.5 10*3/uL (ref 0.1–1.0)
Monocytes Relative: 9 %
Monocytes Relative: 11 %
Neutro Abs: 2.8 10*3/uL (ref 1.7–7.7)
Neutro Abs: 3.3 10*3/uL (ref 1.7–7.7)
Neutrophils Relative %: 77 %
Neutrophils Relative %: 74 %
Platelets: 58 10*3/uL — ABNORMAL LOW (ref 150–400)
Platelets: 64 10*3/uL — ABNORMAL LOW (ref 150–400)
RBC: 2.48 MIL/uL — ABNORMAL LOW (ref 4.22–5.81)
RBC: 2.75 MIL/uL — ABNORMAL LOW (ref 4.22–5.81)
RDW: 17 % — ABNORMAL HIGH (ref 11.5–15.5)
RDW: 17.1 % — ABNORMAL HIGH (ref 11.5–15.5)
WBC: 3.6 10*3/uL — ABNORMAL LOW (ref 4.0–10.5)
WBC: 4.4 10*3/uL (ref 4.0–10.5)
nRBC: 0 % (ref 0.0–0.2)
nRBC: 0 % (ref 0.0–0.2)

## 2022-11-24 LAB — SURGICAL PATHOLOGY

## 2022-11-24 LAB — CUP PACEART REMOTE DEVICE CHECK
Date Time Interrogation Session: 20240414230206
Implantable Pulse Generator Implant Date: 20201014

## 2022-11-24 LAB — BASIC METABOLIC PANEL
Anion gap: 6 (ref 5–15)
BUN: 27 mg/dL — ABNORMAL HIGH (ref 8–23)
CO2: 17 mmol/L — ABNORMAL LOW (ref 22–32)
Calcium: 7.3 mg/dL — ABNORMAL LOW (ref 8.9–10.3)
Chloride: 116 mmol/L — ABNORMAL HIGH (ref 98–111)
Creatinine, Ser: 1.46 mg/dL — ABNORMAL HIGH (ref 0.61–1.24)
GFR, Estimated: 47 mL/min — ABNORMAL LOW (ref >60)
Glucose, Bld: 95 mg/dL (ref 70–99)
Potassium: 4.5 mmol/L (ref 3.5–5.1)
Sodium: 139 mmol/L (ref 135–145)

## 2022-11-24 NOTE — Progress Notes (Signed)
Mobility Specialist: Progress Note   11/24/22 1211  Mobility  Activity Ambulated with assistance in hallway  Level of Assistance Contact guard assist, steadying assist  Assistive Device Front wheel walker  Distance Ambulated (ft) 100 ft  Activity Response Tolerated well  Mobility Referral Yes  $Mobility charge 1 Mobility   Pre-Mobility: 120 HR, 100% SpO2 During Mobility: 148 HR Post-Mobility: 132 HR, 130/80 (94) BP  Pt received in the bed and agreeable to mobility. Mod I with bed mobility and contact guard during ambulation for balance. Pt has tendency to run into objects on his Lt requiring physical assist for RW management and direction. No c/o throughout. Pt back to bed after session with call bell and phone at his side.   Terrell Ostrand Mobility Specialist Please contact via SecureChat or Rehab office at [phone]

## 2022-11-24 NOTE — Progress Notes (Addendum)
PROGRESS NOTE    [name]  NFA:[id number] DOB: [birth year] DOA: 11/20/2022 PCP: [name], FNP   Brief Narrative:  Trevor Murphy is a 88 y.o. male with medical history significant for chronic diastolic CHF (EF 46-96%), CAD, CKD stage IIIb, PAF no longer on anticoagulation, bradycardia, cryptogenic stroke s/p ILR, hepatic cirrhosis with thrombocytopenia, severe mitral regurgitation who presented to the ED for evaluation of abdominal pain with nausea and vomiting.  Was diagnosed with incarcerated left inguinal hernia.  Due to multiple comorbidities, patient was admitted under hospital service and surgery was consulted.  Details below.   Recently hospitalized 09/23/2022-09/28/2022 with decompensated hepatic cirrhosis with ascites.  Required paracentesis with 2.1 L fluid removed.  Started on spironolactone.  He had severe thrombocytopenia and was considered high risk for bleeding therefore after shared decision making Eliquis was discontinued.  Assessment & Plan:   Principal Problem:   Incarcerated left inguinal hernia Active Problems:   Cirrhosis of liver with ascites   Chronic kidney disease, stage 3b   Paroxysmal atrial fibrillation   Chronic diastolic CHF (congestive heart failure)   Essential hypertension   Sinus bradycardia   History of CVA (cerebrovascular accident)   Thrombocytopenia   Severe mitral regurgitation    Incarcerated left inguinal hernia: S/p open left inguinal hernia repair 11/20/2022 by Dr. Sheliah Hatch.  Doing well.  Discussed with general surgery/Dr. Cliffton Asters.  No indication of antibiotics, discontinued Zosyn on 11/21/2022.[name] Kitchen   Paroxysmal atrial fibrillation with sinus bradycardia:  He is off anticoagulation after prior shared decision making on recent admission.  She denies intermittent transient episodes of elevated heart rate.  He remains asymptomatic.  Acute blood loss anemia/bright red blood per rectum: Overnight, on 11/22/2022 his hemoglobin dropped to 5.9 all  the way from 10 preoperatively.  There were no reports of GI bleed or any other source of obvious bleeding at that time.  there was some concern of intra-abdominal bleeding for which CT abdomen with oral contrast was done which ruled out any intra-abdominal bleeding.  He then had large episode of bright red blood per rectum.  He was ordered 2 units of PRBC transfusion, posttransfusion hemoglobin improved to 8.7.  GI was consulted.  Initial plan was to do colonoscopy but that was canceled.  Patient had total of 3 episodes of bright red blood per rectum and had 1 episode this morning of maroon-colored stool.  Hemoglobin is drifting down and 7.5 this morning.  Continue to monitor every 12 hours and transfuse if less than 7.  GI is on board and they want to manage conservatively.  If patient were to have excessive bright red blood per rectum, plan from GI is to repeat CTA instead of colonoscopy as the belief that his rectal bleeding is likely diverticular which hopefully will stop on its own.    Chronic diastolic CHF/severe mitral regurgitation: EF 55-60%.  Does not appear to be in decompensated CHF.  He is on spironolactone and [name] as an outpatient, currently on hold.  Hyperkalemia: Now resolved.  Monitor closely.  Patient received several doses of Lokelma, insulin and dextrose.  Repeat labs today.   History of cryptogenic stroke: Has ILR in place, recent review shows episodes of bradycardia as an outpatient.  Off of anticoagulation as above.   Hepatic cirrhosis with ascites: Small to moderate ascites noted on CT imaging.  He is fully alert and oriented.  No paracentesis indicated.   AKI on CKD stage IIIb: Baseline creatinine 1.5.  Developed mild AKI with creatinine  1.81 on 11/22/2022 which resolved and back to baseline now.   Essential hypertension: BP elevated on admission.  Only on finasteride now.  Aldactone on hold.  Blood pressure controlled.   Chronic thrombocytopenia: Stable. No  indication for transfusion.   Bladder lesion: 18 mm polypoid soft tissue lesion posterior bladder base noted on CT imaging.  Potentially median lobe hypertrophy of the prostate gland although urothelial lesion not excluded per radiology read.  Findings discussed with patient's son.  Consider outpatient urology follow-up pending further goals of care.   Pancreatic head lesion: 6 mm hypodensity in pancreatic head noted on CT imaging.  This is felt to be a cyst or pseudocyst per radiology read.  Consider follow-up CT abdomen with contrast in 2 years pending goals of care.  DVT prophylaxis: SCDs Start: 11/20/22 1938   Code Status: DNR  Family Communication: Son present at bedside.  Plan of care discussed with patient in length and he/she verbalized understanding and agreed with it.  Status is: Inpatient Remains inpatient appropriate because: Having GI bleed.  Needs further assessment by GI.   Estimated body mass index is 19.77 kg/m as calculated from the following:   Height as of this encounter: 5' 10.5" (1.791 m).   Weight as of this encounter: 63.4 kg.    Nutritional Assessment: Body mass index is 19.77 kg/m.[name] Kitchen Seen by dietician.  I agree with the assessment and plan as outlined below: Nutrition Status:        . Skin Assessment: I have examined the patient's skin and I agree with the wound assessment as performed by the wound care RN as outlined below:    Consultants:  Surgery  Procedures:  As above  Antimicrobials:  Anti-infectives (From admission, onward)    Start     Dose/Rate Route Frequency Ordered Stop   11/21/22 0600  piperacillin-tazobactam (ZOSYN) IVPB 3.375 g  Status:  Discontinued        3.375 g 12.5 mL/hr over 240 Minutes Intravenous Every 8 hours 11/20/22 1818 11/21/22 1048   11/20/22 1830  piperacillin-tazobactam (ZOSYN) IVPB 3.375 g        3.375 g 100 mL/hr over 30 Minutes Intravenous  Once 11/20/22 1817 11/20/22 1952         Subjective: Patient  seen and examined.  No complaints.  Son at the bedside.  Objective: Vitals:   11/23/22 1952 11/23/22 2306 11/24/22 0300 11/24/22 0800  BP: 114/66 (!) 105/54 131/65 (!) 142/96  Pulse: 62 70  (!) 128  Resp: Temp: 97.9 F (36.6 C) (!) 97.5 F (36.4 C) 97.8 F (36.6 C) 97.6 F (36.4 C)  TempSrc: Oral Axillary Oral Oral  SpO2: 98% 94% 92% 100%  Weight:   63.4 kg   Height:        Intake/Output Summary (Last 24 hours) at 11/24/2022 1027 Last data filed at 11/24/2022 0656 Gross per 24 hour  Intake 1250 ml  Output 450 ml  Net 800 ml    Filed Weights   11/21/22 0500 11/23/22 0303 11/24/22 0300  Weight: 59.5 kg 58.7 kg 63.4 kg    Examination:  General exam: Appears calm and comfortable  Respiratory system: Clear to auscultation. Respiratory effort normal. Cardiovascular system: S1 & S2 heard, RRR. No JVD, murmurs, rubs, gallops or clicks. No pedal edema. Gastrointestinal system: Abdomen is nondistended, soft and nontender. No organomegaly or masses felt. Normal bowel sounds heard. Central nervous system: Alert and oriented. No focal neurological deficits. Extremities: Symmetric 5 x  5 power. Skin: No rashes, lesions or ulcers.  Psychiatry: Judgement and insight appear normal. Mood & affect appropriate.    Data Reviewed: I have personally reviewed following labs and imaging studies  CBC: Recent Labs  Lab 11/22/22 0159 11/22/22 0352 11/22/22 2149 11/23/22 0801 11/23/22 1158 11/23/22 1735 11/24/22 0543  WBC 8.3  --  6.6 6.4  --  6.0 3.6*  NEUTROABS 6.7  --  5.2 5.1  --  4.7 2.8  HGB 5.9*   < > 8.7* 8.7* 8.6* 8.3* 7.5*  HCT 18.2*   < > 26.0* 26.4* 25.3* 25.2* 22.0*  MCV 93.3  --  88.4 89.2  --  88.7 88.7  PLT 56*  --  44* 61*  --  72* 58*   < > = values in this interval not displayed.    Basic Metabolic Panel: Recent Labs  Lab 11/20/22 1419 11/21/22 0101 11/21/22 0405 11/21/22 1042 11/21/22 1535 11/22/22 0159 11/22/22 1029 11/23/22 0801  NA 137  136  --   --   --  134*  --  138  K 5.1 6.4*   < > 6.2* 5.4* 6.2* 5.4* 5.0  CL 113* 112*  --   --   --  112*  --  115*  CO2 17* 15*  --   --   --  15*  --  19*  GLUCOSE 189* 159*  --   --   --  100*  --  105*  BUN 26* 26*  --   --   --  38*  --  32*  CREATININE 1.60* 1.51*  --   --   --  1.81*  --  1.57*  CALCIUM 8.2* 8.1*  --   --   --  7.3*  --  7.5*   < > = values in this interval not displayed.    GFR: Estimated Creatinine Clearance: 30.8 mL/min (A) (by C-G formula based on SCr of 1.57 mg/dL (H)). Liver Function Tests: Recent Labs  Lab 11/20/22 1419  AST 29  ALT 21  ALKPHOS 112  BILITOT 1.1  PROT 6.9  ALBUMIN 2.8*    Recent Labs  Lab 11/20/22 1419  LIPASE 40    No results for input(s): "AMMONIA" in the last 168 hours. Coagulation Profile: Recent Labs  Lab 11/20/22 1626 11/22/22 2149  INR 1.4* 1.3*    Cardiac Enzymes: No results for input(s): "CKTOTAL", "CKMB", "CKMBINDEX", "TROPONINI" in the last 168 hours. BNP (last 3 results) No results for input(s): "PROBNP" in the last 8760 hours. HbA1C: No results for input(s): "HGBA1C" in the last 72 hours. CBG: No results for input(s): "GLUCAP" in the last 168 hours. Lipid Profile: No results for input(s): "CHOL", "HDL", "LDLCALC", "TRIG", "CHOLHDL", "LDLDIRECT" in the last 72 hours. Thyroid Function Tests: No results for input(s): "TSH", "T4TOTAL", "FREET4", "T3FREE", "THYROIDAB" in the last 72 hours. Anemia Panel: No results for input(s): "VITAMINB12", "FOLATE", "FERRITIN", "TIBC", "IRON", "RETICCTPCT" in the last 72 hours. Sepsis Labs: Recent Labs  Lab 11/20/22 1621 11/21/22 0101 11/21/22 1042 11/21/22 1735  LATICACIDVEN 3.3* 2.8* 4.8* 1.9     No results found for this or any previous visit (from the past 240 hour(s)).   Radiology Studies: CT ABDOMEN PELVIS WO CONTRAST  Result Date: 11/22/2022 CLINICAL DATA:  Abdominal pain, postop incarcerated LEFT inguinal hernia status post surgical repair on  11/20/2022. Acute drop in hemoglobin. Rule out hematoma. EXAM: CT ABDOMEN AND PELVIS WITHOUT CONTRAST TECHNIQUE: Multidetector CT imaging of the abdomen and pelvis was  performed following the standard protocol without IV contrast. RADIATION DOSE REDUCTION: This exam was performed according to the departmental dose-optimization program which includes automated exposure control, adjustment of the mA and/or kV according to patient size and/or use of iterative reconstruction technique. COMPARISON:  CT abdomen and pelvis dated 11/20/2022. FINDINGS: Lower chest: Mild bibasilar atelectasis. Hepatobiliary: No focal liver abnormality is seen. Cholelithiasis. No bile duct dilatation is seen. Pancreas: Unremarkable. No pancreatic ductal dilatation or surrounding inflammatory changes. Spleen: Normal in size without focal abnormality. Adrenals/Urinary Tract: Kidneys are unremarkable without suspicious mass, stone or hydronephrosis. No ureteral or bladder calculi are identified. Stomach/Bowel: No dilated large or small bowel loops. Gas and oral contrast is seen throughout the nondistended large bowel to the rectum. Vascular/Lymphatic: No abdominal aortic aneurysm. No enlarged lymph nodes are seen in the abdomen or pelvis. Reproductive: Prostate is unremarkable. Other: Moderate amount of ascites within the abdomen and pelvis, similar in amount when compared to the preoperative CT of 11/20/2022. No evidence of acute hematoma within the abdomen or pelvis. Surgical changes of LEFT inguinal hernia repair, with staples overlying the LEFT inguinal region. Additional surgical changes are suspected within the RIGHT lower quadrant, corresponding to the op note's description of small-bowel resection with anastomosis. Musculoskeletal: No acute-appearing osseous abnormality. IMPRESSION: 1. Moderate amount of ascites within the abdomen and pelvis, similar in amount when compared to the preoperative CT of 11/20/2022. 2. No evidence of acute  hematoma within the abdomen or pelvis. 3. Surgical changes of LEFT inguinal hernia repair, with staples overlying the LEFT inguinal region. Additional surgical changes are suspected within the RIGHT lower quadrant, corresponding to the op note's description of small-bowel resection with anastomosis. 4. Cholelithiasis without evidence of acute cholecystitis. Electronically Signed   By: Bary Richard M.D.   On: 11/22/2022 16:08    Scheduled Meds:  brinzolamide  1 drop Both Eyes TID   And   brimonidine  1 drop Both Eyes TID   finasteride  5 mg Oral Daily   latanoprost  1 drop Both Eyes QHS   sodium chloride flush  3 mL Intravenous Q12H   Continuous Infusions:  sodium chloride 100 mL/hr at 11/24/22 0656     LOS: 4 days   Hughie Closs, MD [facility]  11/24/2022, 10:27 AM   *Please note that this is a verbal dictation therefore any spelling or grammatical errors are due to the "[employer] One" system interpretation.  Please page via Amion and do not message via secure chat for urgent patient care matters. Secure chat can be used for non urgent patient care matters.  How to contact the [facility] Attending or Consulting provider 7A - 7P or covering provider during after hours 7P -7A, for this patient?  Check the care team in [facility] and look for a) attending/consulting [facility] provider listed and b) the [facility] team listed. Page or secure chat 7A-7P. Log into [email or web address] and use [place]'s universal password to access. If you do not have the password, please contact the hospital operator. Locate the [facility] provider you are looking for under [facility] and page to a number that you can be directly reached. If you still have difficulty reaching the provider, please page the [facility] (Director on Call) for the Hospitalists listed on amion for assistance.

## 2022-11-24 NOTE — Progress Notes (Signed)
Subjective: Hematochezia this AM.  No current complaints.  Objective: Vital signs in last 24 hours: Temp:  [97.4 F (36.3 C)-98.1 F (36.7 C)] 97.4 F (36.3 C) (04/16 1200) Pulse Rate:  [62-128] 117 (04/16 1200) Resp:  [13-19] 19 (04/16 1200) BP: (105-142)/(54-96) 130/80 (04/16 1200) SpO2:  [92 %-100 %] 100 % (04/16 1200) Weight:  [63.4 kg] 63.4 kg (04/16 0300) Last BM Date : 11/24/22  Intake/Output from previous day: 04/15 0701 - 04/16 0700 In: 1250 [P.O.:150; I.V.:1100] Out: 450 [Urine:450] Intake/Output this shift: No intake/output data recorded.  General appearance: alert and no distress GI: soft, non-tender; bowel sounds normal; no masses,  no organomegaly  Lab Results: Recent Labs    11/23/22 0801 11/23/22 1158 11/23/22 1735 11/24/22 0543  WBC 6.4  --  6.0 3.6*  HGB 8.7* 8.6* 8.3* 7.5*  HCT 26.4* 25.3* 25.2* 22.0*  PLT 61*  --  72* 58*   BMET Recent Labs    11/22/22 0159 11/22/22 1029 11/23/22 0801 11/24/22 1029  NA 134*  --  138 139  K 6.2* 5.4* 5.0 4.5  CL 112*  --  115* 116*  CO2 15*  --  19* 17*  GLUCOSE 100*  --  105* 95  BUN 38*  --  32* 27*  CREATININE 1.81*  --  1.57* 1.46*  CALCIUM 7.3*  --  7.5* 7.3*   LFT No results for input(s): "PROT", "ALBUMIN", "AST", "ALT", "ALKPHOS", "BILITOT", "BILIDIR", "IBILI" in the last 72 hours. PT/INR Recent Labs    11/22/22 2149  LABPROT 16.4*  INR 1.3*   Hepatitis Panel No results for input(s): "HEPBSAG", "HCVAB", "HEPAIGM", "HEPBIGM" in the last 72 hours. C-Diff No results for input(s): "CDIFFTOX" in the last 72 hours. Fecal Lactopherrin No results for input(s): "FECLLACTOFRN" in the last 72 hours.  Studies/Results: CT ABDOMEN PELVIS WO CONTRAST  Result Date: 11/22/2022 CLINICAL DATA:  Abdominal pain, postop incarcerated LEFT inguinal hernia status post surgical repair on 11/20/2022. Acute drop in hemoglobin. Rule out hematoma. EXAM: CT ABDOMEN AND PELVIS WITHOUT CONTRAST TECHNIQUE:  Multidetector CT imaging of the abdomen and pelvis was performed following the standard protocol without IV contrast. RADIATION DOSE REDUCTION: This exam was performed according to the departmental dose-optimization program which includes automated exposure control, adjustment of the mA and/or kV according to patient size and/or use of iterative reconstruction technique. COMPARISON:  CT abdomen and pelvis dated 11/20/2022. FINDINGS: Lower chest: Mild bibasilar atelectasis. Hepatobiliary: No focal liver abnormality is seen. Cholelithiasis. No bile duct dilatation is seen. Pancreas: Unremarkable. No pancreatic ductal dilatation or surrounding inflammatory changes. Spleen: Normal in size without focal abnormality. Adrenals/Urinary Tract: Kidneys are unremarkable without suspicious mass, stone or hydronephrosis. No ureteral or bladder calculi are identified. Stomach/Bowel: No dilated large or small bowel loops. Gas and oral contrast is seen throughout the nondistended large bowel to the rectum. Vascular/Lymphatic: No abdominal aortic aneurysm. No enlarged lymph nodes are seen in the abdomen or pelvis. Reproductive: Prostate is unremarkable. Other: Moderate amount of ascites within the abdomen and pelvis, similar in amount when compared to the preoperative CT of 11/20/2022. No evidence of acute hematoma within the abdomen or pelvis. Surgical changes of LEFT inguinal hernia repair, with staples overlying the LEFT inguinal region. Additional surgical changes are suspected within the RIGHT lower quadrant, corresponding to the op note's description of small-bowel resection with anastomosis. Musculoskeletal: No acute-appearing osseous abnormality. IMPRESSION: 1. Moderate amount of ascites within the abdomen and pelvis, similar in amount when compared to the preoperative CT of  11/20/2022. 2. No evidence of acute hematoma within the abdomen or pelvis. 3. Surgical changes of LEFT inguinal hernia repair, with staples overlying  the LEFT inguinal region. Additional surgical changes are suspected within the RIGHT lower quadrant, corresponding to the op note's description of small-bowel resection with anastomosis. 4. Cholelithiasis without evidence of acute cholecystitis. Electronically Signed   By: Bary Richard M.D.   On: 11/22/2022 16:08    Medications: Scheduled:  brinzolamide  1 drop Both Eyes TID   And   brimonidine  1 drop Both Eyes TID   finasteride  5 mg Oral Daily   latanoprost  1 drop Both Eyes QHS   sodium chloride flush  3 mL Intravenous Q12H   Continuous:  sodium chloride 100 mL/hr at 11/24/22 9811    Assessment/Plan: 1) Small bowel anastomotic bleed s/p left obstructing and gangrenous inguinal hernia (11/20/2022). 2) Cirrhosis. 3) Anemia.   The patient's HGB did decrease.  There is some evidence that his bleeding is decreasing.  The situation was discussed with Surgery and there are no plans for a colonoscopy as the bleeding is presumed to be from the anastomosis.  Also, his thrombocytopenia is playing a role in the bleeding.  INR is essentially normal.  With the recent bowel resection he would have a high chance of failure at the anastomosis with the prep.  Plan: 1) No GI intervention. 2) Continue supportive care and transfuse as necessary. 3) Signing off.  Call with any questions, concerns, or changes in his clinical status.  LOS: 4 days   Onesty Clair D 11/24/2022, 3:34 PM

## 2022-11-24 NOTE — Progress Notes (Addendum)
Progress Note  4 Days Post-Op  Subjective: Has had 3 bloody bowel movements since my visit with him yesterday but he says per RN they seem to be increasingly less bloody. He reports feeling some unsteadiness with ambulation and can feel his heart racing at that time but otherwise no dizziness, SHOB, palpitations, CP. HR in 120s at time of my exam and he denies symptoms. Pain well controlled. No nausea or emesis with clears  Objective: Vital signs in last 24 hours: Temp:  [97.5 F (36.4 C)-98.1 F (36.7 C)] 97.6 F (36.4 C) (04/16 0800) Pulse Rate:  [62-128] 128 (04/16 0800) Resp:  [13-18] 16 (04/16 0800) BP: (105-142)/(54-96) 142/96 (04/16 0800) SpO2:  [92 %-100 %] 100 % (04/16 0800) Weight:  [63.4 kg] 63.4 kg (04/16 0300) Last BM Date : 11/24/22  Intake/Output from previous day: 04/15 0701 - 04/16 0700 In: 1250 [P.O.:150; I.V.:1100] Out: 450 [Urine:450] Intake/Output this shift: No intake/output data recorded.  PE: General: pleasant, WD, male who is laying in bed in NAD Heart: tachycardic in 120s Lungs: Respiratory effort nonlabored on room air Abd: soft, ND, +BS, mild TTP over ventral hernia which is soft and reducible. Incision left groin with staples c/d/i MSK: all 4 extremities are symmetrical with no cyanosis, clubbing, or edema. Skin: warm and dry Psych: A&Ox3 with an appropriate affect.    Lab Results:  Recent Labs    11/23/22 1735 11/24/22 0543  WBC 6.0 3.6*  HGB 8.3* 7.5*  HCT 25.2* 22.0*  PLT 72* 58*    BMET Recent Labs    11/22/22 0159 11/22/22 1029 11/23/22 0801  NA 134*  --  138  K 6.2* 5.4* 5.0  CL 112*  --  115*  CO2 15*  --  19*  GLUCOSE 100*  --  105*  BUN 38*  --  32*  CREATININE 1.81*  --  1.57*  CALCIUM 7.3*  --  7.5*    PT/INR Recent Labs    11/22/22 2149  LABPROT 16.4*  INR 1.3*    CMP     Component Value Date/Time   NA 138 11/23/2022 0801   NA 138 06/18/2022 1545   K 5.0 11/23/2022 0801   CL 115 (H) 11/23/2022  0801   CO2 19 (L) 11/23/2022 0801   GLUCOSE 105 (H) 11/23/2022 0801   BUN 32 (H) 11/23/2022 0801   BUN 13 06/18/2022 1545   CREATININE 1.57 (H) 11/23/2022 0801   CREATININE 3.07 (HH) 06/05/2022 1505   CALCIUM 7.5 (L) 11/23/2022 0801   PROT 6.9 11/20/2022 1419   PROT 6.5 04/20/2022 1106   ALBUMIN 2.8 (L) 11/20/2022 1419   ALBUMIN 3.0 (L) 04/20/2022 1106   AST 29 11/20/2022 1419   AST 28 06/05/2022 1505   ALT 21 11/20/2022 1419   ALT 21 06/05/2022 1505   ALKPHOS 112 11/20/2022 1419   BILITOT 1.1 11/20/2022 1419   BILITOT 1.4 (H) 06/05/2022 1505   GFRNONAA 43 (L) 11/23/2022 0801   GFRNONAA 19 (L) 06/05/2022 1505   GFRAA 40 (L) 08/08/2020 1430   Lipase     Component Value Date/Time   LIPASE 40 11/20/2022 1419       Studies/Results: CT ABDOMEN PELVIS WO CONTRAST  Result Date: 11/22/2022 CLINICAL DATA:  Abdominal pain, postop incarcerated LEFT inguinal hernia status post surgical repair on 11/20/2022. Acute drop in hemoglobin. Rule out hematoma. EXAM: CT ABDOMEN AND PELVIS WITHOUT CONTRAST TECHNIQUE: Multidetector CT imaging of the abdomen and pelvis was performed following the standard protocol without  IV contrast. RADIATION DOSE REDUCTION: This exam was performed according to the departmental dose-optimization program which includes automated exposure control, adjustment of the mA and/or kV according to patient size and/or use of iterative reconstruction technique. COMPARISON:  CT abdomen and pelvis dated 11/20/2022. FINDINGS: Lower chest: Mild bibasilar atelectasis. Hepatobiliary: No focal liver abnormality is seen. Cholelithiasis. No bile duct dilatation is seen. Pancreas: Unremarkable. No pancreatic ductal dilatation or surrounding inflammatory changes. Spleen: Normal in size without focal abnormality. Adrenals/Urinary Tract: Kidneys are unremarkable without suspicious mass, stone or hydronephrosis. No ureteral or bladder calculi are identified. Stomach/Bowel: No dilated large or  small bowel loops. Gas and oral contrast is seen throughout the nondistended large bowel to the rectum. Vascular/Lymphatic: No abdominal aortic aneurysm. No enlarged lymph nodes are seen in the abdomen or pelvis. Reproductive: Prostate is unremarkable. Other: Moderate amount of ascites within the abdomen and pelvis, similar in amount when compared to the preoperative CT of 11/20/2022. No evidence of acute hematoma within the abdomen or pelvis. Surgical changes of LEFT inguinal hernia repair, with staples overlying the LEFT inguinal region. Additional surgical changes are suspected within the RIGHT lower quadrant, corresponding to the op note's description of small-bowel resection with anastomosis. Musculoskeletal: No acute-appearing osseous abnormality. IMPRESSION: 1. Moderate amount of ascites within the abdomen and pelvis, similar in amount when compared to the preoperative CT of 11/20/2022. 2. No evidence of acute hematoma within the abdomen or pelvis. 3. Surgical changes of LEFT inguinal hernia repair, with staples overlying the LEFT inguinal region. Additional surgical changes are suspected within the RIGHT lower quadrant, corresponding to the op note's description of small-bowel resection with anastomosis. 4. Cholelithiasis without evidence of acute cholecystitis. Electronically Signed   By: Bary Richard M.D.   On: 11/22/2022 16:08    Anti-infectives: Anti-infectives (From admission, onward)    Start     Dose/Rate Route Frequency Ordered Stop   11/21/22 0600  piperacillin-tazobactam (ZOSYN) IVPB 3.375 g  Status:  Discontinued        3.375 g 12.5 mL/hr over 240 Minutes Intravenous Every 8 hours 11/20/22 1818 11/21/22 1048   11/20/22 1830  piperacillin-tazobactam (ZOSYN) IVPB 3.375 g        3.375 g 100 mL/hr over 30 Minutes Intravenous  Once 11/20/22 1817 11/20/22 1952        Assessment/Plan Left obstructing and gangrenous inguinal hernia  POD4 s/p open Left obstructing and gangrenous  inguinal hernia repair, small bowel resection with anastomosis 11/20/2022 Dr. Sheliah Hatch - remains tachycardic - significant drop in hgb with large volume maroon stool 4/14 am. S/p 2 u RBC 4/14.  hgb - 7.5 this am. Suspect SB anastomotic bleed which should be self limiting. Still having maroon bms but hgb overall stable. Continue to trend - thrombocytopenia - plts 58 today. PT INR stable from prior - CT abd/pelvis wo contrast 4/14 with post surgical changes, stable mod ascites, no evidence of acute hematoma. No hemoperitoneum - GI consulted and holding off on colonoscopy for now  Continue to trend hgb. May advance to FLD today  FEN: CLD ID: [email or web address] periop VTE: hold in setting of acute bleeding  Per primary Chronic diastolic CHF/severe MR History of cryptogenic stroke Hepatic cirrhosis with ascites AKI on CKD stage IIIb Essential hypertension Chronic thrombocytopenia Bladder lesion: Pancreatic head lesion A fib    LOS: 4 days   Eric Form, [facility] Surgery 11/24/2022, 8:49 AM Please see Amion for pager number during day hours 7:00am-4:30pm

## 2022-11-25 DIAGNOSIS — K4030 Unilateral inguinal hernia, with obstruction, without gangrene, not specified as recurrent: Secondary | ICD-10-CM | POA: Diagnosis not present

## 2022-11-25 LAB — CBC WITH DIFFERENTIAL/PLATELET
Abs Immature Granulocytes: 0.01 10*3/uL (ref 0.00–0.07)
Basophils Absolute: 0 10*3/uL (ref 0.0–0.1)
Basophils Relative: 0 %
Eosinophils Absolute: 0.2 10*3/uL (ref 0.0–0.5)
Eosinophils Relative: 5 %
HCT: 24.5 % — ABNORMAL LOW (ref 39.0–52.0)
Hemoglobin: 8 g/dL — ABNORMAL LOW (ref 13.0–17.0)
Immature Granulocytes: 0 %
Lymphocytes Relative: 13 %
Lymphs Abs: 0.5 10*3/uL — ABNORMAL LOW (ref 0.7–4.0)
MCH: 29.7 pg (ref 26.0–34.0)
MCHC: 32.7 g/dL (ref 30.0–36.0)
MCV: 91.1 fL (ref 80.0–100.0)
Monocytes Absolute: 0.4 10*3/uL (ref 0.1–1.0)
Monocytes Relative: 10 %
Neutro Abs: 2.6 10*3/uL (ref 1.7–7.7)
Neutrophils Relative %: 72 %
Platelets: 67 10*3/uL — ABNORMAL LOW (ref 150–400)
RBC: 2.69 MIL/uL — ABNORMAL LOW (ref 4.22–5.81)
RDW: 17 % — ABNORMAL HIGH (ref 11.5–15.5)
WBC: 3.7 10*3/uL — ABNORMAL LOW (ref 4.0–10.5)
nRBC: 0 % (ref 0.0–0.2)

## 2022-11-25 NOTE — Progress Notes (Signed)
PROGRESS NOTE    Trevor Murphy  WUJ:[id number] DOB: 11/10/1934 DOA: 11/20/2022 PCP: [name], FNP   Brief Narrative:  Trevor Murphy is a 88 y.o. male with medical history significant for chronic diastolic CHF (EF 95-62%), CAD, CKD stage IIIb, PAF no longer on anticoagulation, bradycardia, cryptogenic stroke s/p ILR, hepatic cirrhosis with thrombocytopenia, severe mitral regurgitation who presented to the ED for evaluation of abdominal pain with nausea and vomiting.  Was diagnosed with incarcerated left inguinal hernia.  Due to multiple comorbidities, patient was admitted under hospital service and surgery was consulted.  Details below.   Recently hospitalized 09/23/2022-09/28/2022 with decompensated hepatic cirrhosis with ascites.  Required paracentesis with 2.1 L fluid removed.  Started on spironolactone.  He had severe thrombocytopenia and was considered high risk for bleeding therefore after shared decision making Eliquis was discontinued.  Assessment & Plan:   Principal Problem:   Incarcerated left inguinal hernia Active Problems:   Cirrhosis of liver with ascites   Chronic kidney disease, stage 3b   Paroxysmal atrial fibrillation   Chronic diastolic CHF (congestive heart failure)   Essential hypertension   Sinus bradycardia   History of CVA (cerebrovascular accident)   Thrombocytopenia   Severe mitral regurgitation    Incarcerated left inguinal hernia: S/p open left inguinal hernia repair 11/20/2022 by Dr. Sheliah Hatch.  Doing well.  Discussed with general surgery/Dr. Cliffton Asters.  No indication of antibiotics, discontinued Zosyn on 11/21/2022.[name] Kitchen   Paroxysmal atrial fibrillation with sinus bradycardia:  He is off anticoagulation after prior shared decision making on recent admission.  She did have intermittent transient episodes of elevated heart rate.  He remains asymptomatic.  Acute blood loss anemia/bright red blood per rectum: Overnight, on 11/22/2022 his hemoglobin dropped to 5.9 all  the way from 10 preoperatively.  There were no reports of GI bleed or any other source of obvious bleeding at that time.  there was some concern of intra-abdominal bleeding for which CT abdomen with oral contrast was done which ruled out any intra-abdominal bleeding.  He then had large episode of bright red blood per rectum.  He was ordered 2 units of PRBC transfusion, posttransfusion hemoglobin improved to 8.7.  GI was consulted.  Initial plan was to do colonoscopy but that was canceled.  Patient had total of 3 episodes of bright red blood per rectum and had 1 episode this morning of maroon-colored stool.  Hemoglobin is drifting down and 7.5 this morning.  Continue to monitor every 12 hours and transfuse if less than 7.  GI on board and they wanted to manage conservatively.  If patient were to have excessive bright red blood per rectum, plan from GI is to repeat CTA instead of colonoscopy as the belief that his rectal bleeding is likely diverticular which hopefully will stop on its own.  GI/Dr. Elnoria Howard eventually signed off yesterday.  Reportedly, patient had 2 bowel movements yesterday which had some blood in it but this morning he had 1 bowel movement without any blood and hemoglobin remained stable.  Due to his several comorbidities and tenuous situation, it would be safer to keep him another night and observe and if he remains stable without any evidence of GI bleeding, he can be discharged tomorrow.   Chronic diastolic CHF/severe mitral regurgitation: EF 55-60%.  Does not appear to be in decompensated CHF.  He is on spironolactone and [name] as an outpatient, currently on hold.  Hyperkalemia: Now resolved. Patient received several doses of Lokelma, insulin and dextrose.  Repeat lab  tomorrow morning.   History of cryptogenic stroke: Has ILR in place, recent review shows episodes of bradycardia as an outpatient.  Off of anticoagulation as above.   Hepatic cirrhosis with ascites: Small to moderate  ascites noted on CT imaging.  He is fully alert and oriented.  No paracentesis indicated.   AKI on CKD stage IIIb: Baseline creatinine 1.5.  Developed mild AKI with creatinine 1.81 on 11/22/2022 which resolved and back to baseline now.   Essential hypertension: BP elevated on admission.  Only on finasteride now.  Aldactone on hold.  Blood pressure controlled.   Chronic thrombocytopenia: Stable. No indication for transfusion.   Bladder lesion: 18 mm polypoid soft tissue lesion posterior bladder base noted on CT imaging.  Potentially median lobe hypertrophy of the prostate gland although urothelial lesion not excluded per radiology read.  Findings discussed with patient's son.  Consider outpatient urology follow-up.    Pancreatic head lesion: 6 mm hypodensity in pancreatic head noted on CT imaging.  This is felt to be a cyst or pseudocyst per radiology read.  Consider follow-up CT abdomen with contrast in 2 years pending goals of care.  DVT prophylaxis: SCDs Start: 11/20/22 1938   Code Status: DNR  Family Communication: Son present at bedside.  Plan of care discussed with patient in length and he/she verbalized understanding and agreed with it.  Status is: Inpatient Remains inpatient appropriate because: Has started to stabilize.  Needs another day of observation.  Estimated body mass index is 19.68 kg/m as calculated from the following:   Height as of this encounter: 5' 10.5" (1.791 m).   Weight as of this encounter: 63.1 kg.    Nutritional Assessment: Body mass index is 19.68 kg/m.[name] Kitchen Seen by dietician.  I agree with the assessment and plan as outlined below: Nutrition Status:        . Skin Assessment: I have examined the patient's skin and I agree with the wound assessment as performed by the wound care RN as outlined below:    Consultants:  Surgery  Procedures:  As above  Antimicrobials:  Anti-infectives (From admission, onward)    Start     Dose/Rate Route  Frequency Ordered Stop   11/21/22 0600  piperacillin-tazobactam (ZOSYN) IVPB 3.375 g  Status:  Discontinued        3.375 g 12.5 mL/hr over 240 Minutes Intravenous Every 8 hours 11/20/22 1818 11/21/22 1048   11/20/22 1830  piperacillin-tazobactam (ZOSYN) IVPB 3.375 g        3.375 g 100 mL/hr over 30 Minutes Intravenous  Once 11/20/22 1817 11/20/22 1952         Subjective: Seen and examined.  He has no complaints.  Objective: Vitals:   11/24/22 2348 11/25/22 0406 11/25/22 0700 11/25/22 0832  BP: 114/63 (!) 145/74  (!) 141/65  Pulse: (!) 58 66    Resp: Temp: 97.9 F (36.6 C) 98 F (36.7 C)  (!) 97.4 F (36.3 C)  TempSrc: Oral Oral  Oral  SpO2: 100% 100%    Weight:   63.1 kg   Height:        Intake/Output Summary (Last 24 hours) at 11/25/2022 1140 Last data filed at 11/25/2022 1101 Gross per 24 hour  Intake 2031.24 ml  Output 500 ml  Net 1531.24 ml    Filed Weights   11/23/22 0303 11/24/22 0300 11/25/22 0700  Weight: 58.7 kg 63.4 kg 63.1 kg    Examination: General exam: Appears calm and comfortable  Respiratory system: Clear to auscultation. Respiratory effort normal. Cardiovascular system: S1 & S2 heard, RRR. No JVD, murmurs, rubs, gallops or clicks. No pedal edema. Gastrointestinal system: Abdomen is nondistended, soft and nontender. No organomegaly or masses felt. Normal bowel sounds heard. Central nervous system: Alert and oriented. No focal neurological deficits. Extremities: Symmetric 5 x 5 power. Skin: No rashes, lesions or ulcers.  Psychiatry: Judgement and insight appear normal. Mood & affect appropriate.    Data Reviewed: I have personally reviewed following labs and imaging studies  CBC: Recent Labs  Lab 11/23/22 0801 11/23/22 1158 11/23/22 1735 11/24/22 0543 11/24/22 1738 11/25/22 0550  WBC 6.4  --  6.0 3.6* 4.4 3.7*  NEUTROABS 5.1  --  4.7 2.8 3.3 2.6  HGB 8.7* 8.6* 8.3* 7.5* 8.1* 8.0*  HCT 26.4* 25.3* 25.2* 22.0* 24.8* 24.5*   MCV 89.2  --  88.7 88.7 90.2 91.1  PLT 61*  --  72* 58* 64* 67*    Basic Metabolic Panel: Recent Labs  Lab 11/20/22 1419 11/21/22 0101 11/21/22 0405 11/21/22 1535 11/22/22 0159 11/22/22 1029 11/23/22 0801 11/24/22 1029  NA 137 136  --   --  134*  --  138 139  K 5.1 6.4*   < > 5.4* 6.2* 5.4* 5.0 4.5  CL 113* 112*  --   --  112*  --  115* 116*  CO2 17* 15*  --   --  15*  --  19* 17*  GLUCOSE 189* 159*  --   --  100*  --  105* 95  BUN 26* 26*  --   --  38*  --  32* 27*  CREATININE 1.60* 1.51*  --   --  1.81*  --  1.57* 1.46*  CALCIUM 8.2* 8.1*  --   --  7.3*  --  7.5* 7.3*   < > = values in this interval not displayed.    GFR: Estimated Creatinine Clearance: 33 mL/min (A) (by C-G formula based on SCr of 1.46 mg/dL (H)). Liver Function Tests: Recent Labs  Lab 11/20/22 1419  AST 29  ALT 21  ALKPHOS 112  BILITOT 1.1  PROT 6.9  ALBUMIN 2.8*    Recent Labs  Lab 11/20/22 1419  LIPASE 40    No results for input(s): "AMMONIA" in the last 168 hours. Coagulation Profile: Recent Labs  Lab 11/20/22 1626 11/22/22 2149  INR 1.4* 1.3*    Cardiac Enzymes: No results for input(s): "CKTOTAL", "CKMB", "CKMBINDEX", "TROPONINI" in the last 168 hours. BNP (last 3 results) No results for input(s): "PROBNP" in the last 8760 hours. HbA1C: No results for input(s): "HGBA1C" in the last 72 hours. CBG: No results for input(s): "GLUCAP" in the last 168 hours. Lipid Profile: No results for input(s): "CHOL", "HDL", "LDLCALC", "TRIG", "CHOLHDL", "LDLDIRECT" in the last 72 hours. Thyroid Function Tests: No results for input(s): "TSH", "T4TOTAL", "FREET4", "T3FREE", "THYROIDAB" in the last 72 hours. Anemia Panel: No results for input(s): "VITAMINB12", "FOLATE", "FERRITIN", "TIBC", "IRON", "RETICCTPCT" in the last 72 hours. Sepsis Labs: Recent Labs  Lab 11/20/22 1621 11/21/22 0101 11/21/22 1042 11/21/22 1735  LATICACIDVEN 3.3* 2.8* 4.8* 1.9     No results found for this or  any previous visit (from the past 240 hour(s)).   Radiology Studies: No results found.  Scheduled Meds:  brinzolamide  1 drop Both Eyes TID   And   brimonidine  1 drop Both Eyes TID   finasteride  5 mg Oral Daily   latanoprost  1 drop  Both Eyes QHS   sodium chloride flush  3 mL Intravenous Q12H   Continuous Infusions:  sodium chloride 100 mL/hr at 11/25/22 1134     LOS: 5 days   Hughie Closs, MD [facility]  11/25/2022, 11:40 AM   *Please note that this is a verbal dictation therefore any spelling or grammatical errors are due to the "[employer]agon [employer] One" system interpretation.  Please page via Amion and do not message via secure chat for urgent patient care matters. Secure chat can be used for non urgent patient care matters.  How to contact the [facility] Attending or Consulting provider 7A - 7P or covering provider during after hours 7P -7A, for this patient?  Check the care team in [facility] and look for a) attending/consulting TR[facility] provider listed and b) the [facility] team listed. Page or secure chat 7A-7P. Log into [email or web address] and use [facility] universal password to access. If you do not have the password, please contact the hospital operator. Locate the [facility] provider you are looking for under [facility] and page to a number that you can be directly reached. If you still have difficulty reaching the provider, please page the [facility] (Director on Call) for the Hospitalists listed on amion for assistance.

## 2022-11-25 NOTE — Consult Note (Signed)
   [facility] CM Inpatient Consult   11/25/2022  KENYATA GUESS [birth year] [id number]  [facility] [[facility]]  Accountable Care Organization [ACO] Patient:  Primary Care Provider:    Patient is currently active with [facility] [[facility]] Care Management for chronic disease management services.  Patient has been engaged by a [facility] ***.  Our community based plan of care has focused on disease management and community resource support.    Patient will receive a post hospital call and will be evaluated for assessments and disease process education.    3:55 pm Met with patient and brother at bedside, confirm PCP and ongoing follow up  Plan:   *** Inpatient Transition Of Care [TOC] team member to make aware that [facility] Care Management following.   Of note, [facility] Care Management services does not replace or interfere with any services that are needed or arranged by inpatient [facility] care management team.   For additional questions or referrals please contact:  Charlesetta Shanks, RN BSN CCM [facility]  [phone] business mobile phone Toll free office [phone]  *Concierge Line  [phone] Fax number: [phone] [email or web address]

## 2022-11-25 NOTE — Progress Notes (Signed)
Physical Therapy Treatment Patient Details Name: Trevor Murphy MRN: [id number] DOB: 1935-07-05 Today's Date: 11/25/2022   History of Present Illness 88 y/o male admitted for incarcerated left inguinal hernia and underwent surgical repair on 11/20/2022. Hgb dropped to 5.8 and pt given 2 unit PRBC, 11/22/22 Medical history significant for chronic diastolic CHF (EF 40-98%), CAD, CKD stage IIIb, PAF, bradycardia, cryptogenic stroke s/p ILR, hepatic cirrhosis with thrombocytopenia, severe mitral regurgitation, impaired vision.    PT Comments    Pt received in supine, drowsy but awoken with increased time and agreeable to therapy session and with good participation and tolerance for transfer and gait training. Pt needing up to minA for bed mobility and navigation using RW in hallway (legally blind and veers toward L side of hall), mod cues for upright posture. VSS on RA. Pt continues to benefit from PT services to progress toward functional mobility goals.   Recommendations for follow up therapy are one component of a multi-disciplinary discharge planning process, led by the attending physician.  Recommendations may be updated based on patient status, additional functional criteria and insurance authorization.  Follow Up Recommendations       Assistance Recommended at Discharge Frequent or constant Supervision/Assistance  Patient can return home with the following A little help with walking and/or transfers;A little help with bathing/dressing/bathroom;Assistance with cooking/housework;Direct supervision/assist for medications management;Direct supervision/assist for financial management;Assist for transportation;Help with stairs or ramp for entrance   Equipment Recommendations  None recommended by PT    Recommendations for Other Services       Precautions / Restrictions Precautions Precautions: Fall Precaution Comments: legally blind, only sees shapes out of one eye, can't really see  colors Restrictions Weight Bearing Restrictions: No     Mobility  Bed Mobility Overal bed mobility: Needs Assistance Bed Mobility: Supine to Sit, Sit to Supine     Supine to sit: Min assist Sit to supine: Min guard   General bed mobility comments: increased assist to raise trunk due to pt fatigue/lethargy initially; pt HOB elevated and using bed rail but needs additional support to sit fully upright; min guard and cues for positioning returning to supine    Transfers Overall transfer level: Needs assistance Equipment used: Rolling walker (2 wheels) Transfers: Sit to/from Stand Sit to Stand: Min guard           General transfer comment: cues for safe UE placement standing to RW    Ambulation/Gait Ambulation/Gait assistance: Min guard, Min assist Gait Distance (Feet): 150 Feet Assistive device: Rolling walker (2 wheels) Gait Pattern/deviations: Step-through pattern, WFL(Within Functional Limits), Decreased step length - right, Decreased step length - left, Shuffle, Trunk flexed Gait velocity: slowed Gait velocity interpretation: <1.31 ft/sec, indicative of household ambulator   General Gait Details: min A for navigation due to visual deficits, min guard for straight line ambulation. Pt veering toward L side of hallway continuously. Flexed trunk so RW raised more and pt given intermittent tactile cues for hip extension/forward gaze.   Stairs             Wheelchair Mobility    Modified Rankin (Stroke Patients Only)       Balance Overall balance assessment: Needs assistance Sitting-balance support: Feet supported, Feet unsupported, Bilateral upper extremity supported, Single extremity supported Sitting balance-Leahy Scale: Fair     Standing balance support: Bilateral upper extremity supported, During functional activity, Reliant on assistive device for balance Standing balance-Leahy Scale: Poor  Cognition  Arousal/Alertness: Awake/alert Behavior During Therapy: WFL for tasks assessed/performed Overall Cognitive Status: Within Functional Limits for tasks assessed                                 General Comments: drowsy initially, slow processing, more alert once seated EOB able to open his eyes more.        Exercises Other Exercises Other Exercises: encouraged ankle pumps hourly x10 reps, pt agreeable to SCDs donned in supine    General Comments General comments (skin integrity, edema, etc.): VSS on RA, HR WFL; IV site leaking and RN notified, entering room to assess at end of session; heels floated once back in supine      Pertinent Vitals/Pain Pain Assessment Pain Assessment: Faces Faces Pain Scale: Hurts a little bit Pain Location: abdominal surgical site Pain Descriptors / Indicators: Sore, Grimacing Pain Intervention(s): Monitored during session, Repositioned     PT Goals (current goals can now be found in the care plan section) Acute Rehab PT Goals Patient Stated Goal: get home PT Goal Formulation: With patient Time For Goal Achievement: 12/07/22 Progress towards PT goals: Progressing toward goals    Frequency    Min 1X/week      PT Plan Current plan remains appropriate       AM-PAC PT "6 Clicks" Mobility   Outcome Measure  Help needed turning from your back to your side while in a flat bed without using bedrails?: None Help needed moving from lying on your back to sitting on the side of a flat bed without using bedrails?: A Little Help needed moving to and from a bed to a chair (including a wheelchair)?: A Little Help needed standing up from a chair using your arms (e.g., wheelchair or bedside chair)?: A Little Help needed to walk in hospital room?: A Little Help needed climbing 3-5 steps with a railing? : A Lot 6 Click Score: 18    End of Session Equipment Utilized During Treatment: Gait belt Activity Tolerance: Patient tolerated treatment  well Patient left: in bed;with call bell/phone within reach;with bed alarm set;with SCD's reapplied;Other (comment);with nursing/sitter in room;with family/visitor present (heels floated; son present) Nurse Communication: Mobility status;Other (comment) (IV leaking) PT Visit Diagnosis: Unsteadiness on feet (R26.81);Muscle weakness (generalized) (M62.81);Pain     Time: 4098-1191 PT Time Calculation (min) (ACUTE ONLY): 20 min  Charges:  $Gait Training: 8-22 mins                     Trevor Dearman P., Trevor Murphy Acute Rehabilitation Services Secure Chat Preferred 9a-5:30pm Office: [phone]    Angus [place]ms 11/25/2022, 7:22 PM

## 2022-11-25 NOTE — Progress Notes (Signed)
Occupational Therapy Treatment Patient Details Name: Trevor Murphy MRN: [id number] DOB: [birth year] Today's Date: 11/25/2022   History of present illness 88 y/o male admitted for incarcerated left inguinal hernia and underwent surgical repair on 11/20/2022. Hgb dropped to 5.8 and pt given 2 unit PRBC, 11/22/22 Medical history significant for chronic diastolic CHF (EF 69-62%), CAD, CKD stage IIIb, PAF, bradycardia, cryptogenic stroke s/p ILR, hepatic cirrhosis with thrombocytopenia, severe mitral regurgitation, impaired vision.   OT comments  Pt progressing towards goals this session, needing min guard A for ADLs, bed mobility, and transfers with RW. Pt able to complete simulated tub transfer and toilet transfer this session, HR 60-80 with mobility. Pt/family member educated on use of shower seat for home, pt/family verbalized understanding. Pt presenting with impairments listed below, will follow acutely. Continue to recommend HHOT at d/c.   Recommendations for follow up therapy are one component of a multi-disciplinary discharge planning process, led by the attending physician.  Recommendations may be updated based on patient status, additional functional criteria and insurance authorization.    Assistance Recommended at Discharge Intermittent Supervision/Assistance  Patient can return home with the following  A little help with walking and/or transfers;A little help with bathing/dressing/bathroom;Assistance with cooking/housework;Direct supervision/assist for financial management;Direct supervision/assist for medications management;Help with stairs or ramp for entrance   Equipment Recommendations  BSC/3in1    Recommendations for Other Services PT consult    Precautions / Restrictions Precautions Precautions: Fall Restrictions Weight Bearing Restrictions: No       Mobility Bed Mobility Overal bed mobility: Needs Assistance Bed Mobility: Supine to Sit, Sit to Supine     Supine to sit:  Min guard Sit to supine: Min guard        Transfers Overall transfer level: Needs assistance Equipment used: Rolling walker (2 wheels) Transfers: Sit to/from Stand Sit to Stand: Min guard                 Balance Overall balance assessment: Needs assistance Sitting-balance support: Feet supported, Feet unsupported, Bilateral upper extremity supported, Single extremity supported Sitting balance-Leahy Scale: Fair     Standing balance support: Bilateral upper extremity supported, During functional activity, Reliant on assistive device for balance Standing balance-Leahy Scale: Poor                             ADL either performed or assessed with clinical judgement   ADL Overall ADL's : Needs assistance/impaired                         Toilet Transfer: Min guard;Rolling walker (2 wheels)       Tub/ Shower Transfer: Min guard;Tub transfer;Ambulation;Rolling walker (2 wheels)   Functional mobility during ADLs: Min guard;Rolling walker (2 wheels)      Extremity/Trunk Assessment Upper Extremity Assessment Upper Extremity Assessment: Generalized weakness   Lower Extremity Assessment Lower Extremity Assessment: Defer to PT evaluation        Vision       Perception Perception Perception: Not tested   Praxis Praxis Praxis: Not tested    Cognition Arousal/Alertness: Awake/alert Behavior During Therapy: WFL for tasks assessed/performed Overall Cognitive Status: Within Functional Limits for tasks assessed                                          Exercises  Shoulder Instructions       General Comments VSS on RA    Pertinent Vitals/ Pain       Pain Assessment Pain Assessment: Faces Pain Score: 4  Faces Pain Scale: Hurts little more Pain Location: abdominal surgical site Pain Descriptors / Indicators: Aching, Sore, Grimacing, Guarding Pain Intervention(s): Limited activity within patient's tolerance, Monitored  during session, Repositioned  Home Living Family/patient expects to be discharged to:: Private residence                                        Prior Functioning/Environment              Frequency  Min 2X/week        Progress Toward Goals  OT Goals(current goals can now be found in the care plan section)  Progress towards OT goals: Progressing toward goals  Acute Rehab OT Goals Patient Stated Goal: none stated OT Goal Formulation: With patient Time For Goal Achievement: 12/07/22 Potential to Achieve Goals: Good ADL Goals Pt Will Perform Upper Body Dressing: with modified independence;sitting Pt Will Perform Lower Body Dressing: with modified independence;sitting/lateral leans;sit to/from stand Pt Will Transfer to Toilet: with modified independence;ambulating;regular height toilet Pt Will Perform Tub/Shower Transfer: Tub transfer;Shower transfer;with modified independence;ambulating;shower seat  Plan Discharge plan remains appropriate;Frequency remains appropriate    Co-evaluation                 AM-PAC OT "6 Clicks" Daily Activity     Outcome Measure   Help from another person eating meals?: None Help from another person taking care of personal grooming?: A Little Help from another person toileting, which includes using toliet, bedpan, or urinal?: A Little Help from another person bathing (including washing, rinsing, drying)?: A Lot Help from another person to put on and taking off regular upper body clothing?: A Little Help from another person to put on and taking off regular lower body clothing?: A Lot 6 Click Score: 17    End of Session Equipment Utilized During Treatment: Gait belt;Rolling walker (2 wheels)  OT Visit Diagnosis: Unsteadiness on feet (R26.81);Other abnormalities of gait and mobility (R26.89);Muscle weakness (generalized) (M62.81)   Activity Tolerance Patient tolerated treatment well   Patient Left in bed;with call  bell/phone within reach;with bed alarm set;with family/visitor present   Nurse Communication Mobility status        Time: 1202-1226 OT Time Calculation (min): 24 min  Charges: OT General Charges $OT Visit: 1 Visit OT Treatments $Self Care/Home Management : 8-22 mins $Therapeutic Activity: 8-22 mins  Carver Fila, OTD, OTR/L SecureChat Preferred Acute Rehab [phone]   Carver Fila Koonce 11/25/2022, 12:42 PM

## 2022-11-25 NOTE — Progress Notes (Addendum)
Progress Note  5 Days Post-Op  Subjective: He thinks he has had up to 3 bowel movements since yesterday morning and was told by RN last one did not appear bloody. Tolerated fulls without n/v/abdominal pain. Surgical pain well controlled.  Objective: Vital signs in last 24 hours: Temp:  [97.4 F (36.3 C)-98.3 F (36.8 C)] 97.4 F (36.3 C) (04/17 0832) Pulse Rate:  [58-117] 66 (04/17 0406) Resp:  [16-19] 18 (04/17 0832) BP: (114-145)/(63-86) 141/65 (04/17 0832) SpO2:  [98 %-100 %] 100 % (04/17 0406) Weight:  [63.1 kg] 63.1 kg (04/17 0700) Last BM Date : 11/24/22  Intake/Output from previous day: 04/16 0701 - 04/17 0700 In: 1531.3 [P.O.:150; I.V.:1381.3] Out: 500 [Urine:500] Intake/Output this shift: Total I/O In: 300 [I.V.:300] Out: -   PE: General: pleasant, WD, male who is laying in bed in NAD Heart: regular rate Lungs: Respiratory effort nonlabored on room air Abd: soft, ND, +BS, mild TTP over ventral hernia which is soft and reducible. Incision left groin with staples c/d/i MSK: all 4 extremities are symmetrical with no cyanosis, clubbing, or edema. Skin: warm and dry Psych: A&Ox3 with an appropriate affect.    Lab Results:  Recent Labs    11/24/22 1738 11/25/22 0550  WBC 4.4 3.7*  HGB 8.1* 8.0*  HCT 24.8* 24.5*  PLT 64* 67*    BMET Recent Labs    11/23/22 0801 11/24/22 1029  NA 138 139  K 5.0 4.5  CL 115* 116*  CO2 19* 17*  GLUCOSE 105* 95  BUN 32* 27*  CREATININE 1.57* 1.46*  CALCIUM 7.5* 7.3*    PT/INR Recent Labs    11/22/22 2149  LABPROT 16.4*  INR 1.3*    CMP     Component Value Date/Time   NA 139 11/24/2022 1029   NA 138 06/18/2022 1545   K 4.5 11/24/2022 1029   CL 116 (H) 11/24/2022 1029   CO2 17 (L) 11/24/2022 1029   GLUCOSE 95 11/24/2022 1029   BUN 27 (H) 11/24/2022 1029   BUN 13 06/18/2022 1545   CREATININE 1.46 (H) 11/24/2022 1029   CREATININE 3.07 (HH) 06/05/2022 1505   CALCIUM 7.3 (L) 11/24/2022 1029   PROT 6.9  11/20/2022 1419   PROT 6.5 04/20/2022 1106   ALBUMIN 2.8 (L) 11/20/2022 1419   ALBUMIN 3.0 (L) 04/20/2022 1106   AST 29 11/20/2022 1419   AST 28 06/05/2022 1505   ALT 21 11/20/2022 1419   ALT 21 06/05/2022 1505   ALKPHOS 112 11/20/2022 1419   BILITOT 1.1 11/20/2022 1419   BILITOT 1.4 (H) 06/05/2022 1505   GFRNONAA 47 (L) 11/24/2022 1029   GFRNONAA 19 (L) 06/05/2022 1505   GFRAA 40 (L) 08/08/2020 1430   Lipase     Component Value Date/Time   LIPASE 40 11/20/2022 1419       Studies/Results: No results found.  Anti-infectives: Anti-infectives (From admission, onward)    Start     Dose/Rate Route Frequency Ordered Stop   11/21/22 0600  piperacillin-tazobactam (ZOSYN) IVPB 3.375 g  Status:  Discontinued        3.375 g 12.5 mL/hr over 240 Minutes Intravenous Every 8 hours 11/20/22 1818 11/21/22 1048   11/20/22 1830  piperacillin-tazobactam (ZOSYN) IVPB 3.375 g        3.375 g 100 mL/hr over 30 Minutes Intravenous  Once 11/20/22 1817 11/20/22 1952        Assessment/Plan Left obstructing and gangrenous inguinal hernia  POD5 s/p open Left obstructing and gangrenous inguinal  hernia repair, small bowel resection with anastomosis 11/20/2022 Dr. Sheliah Hatch - tachycardia improved - significant drop in hgb with large volume maroon stool 4/14 am. S/p 2 u RBC 4/14.  hgb - 8.0 this am. Suspect SB anastomotic bleed which should be self limiting. maroon Bms decreasing. Continue to trend - thrombocytopenia overall stable. PT INR stable from prior - CT abd/pelvis wo contrast 4/14 with post surgical changes, stable mod ascites, no evidence of acute hematoma. No hemoperitoneum - GI consulted and no colonoscopy at this time  FEN: dys3/mechanical soft (edentulous and does not have dentures here) ID: [email or web address] periop VTE: hold in setting of acute bleeding  Per primary Chronic diastolic CHF/severe MR History of cryptogenic stroke Hepatic cirrhosis with ascites AKI on CKD stage IIIb Essential  hypertension Chronic thrombocytopenia Bladder lesion: Pancreatic head lesion A fib    LOS: 5 days   Eric Form, [facility] 11/25/2022, 10:16 AM Please see Amion for pager number during day hours 7:00am-4:30pm

## 2022-11-26 ENCOUNTER — Telehealth: Payer: Self-pay | Admitting: Hematology and Oncology

## 2022-11-26 DIAGNOSIS — K7460 Unspecified cirrhosis of liver: Secondary | ICD-10-CM

## 2022-11-26 DIAGNOSIS — N1832 Chronic kidney disease, stage 3b: Secondary | ICD-10-CM

## 2022-11-26 DIAGNOSIS — I10 Essential (primary) hypertension: Secondary | ICD-10-CM

## 2022-11-26 DIAGNOSIS — D696 Thrombocytopenia, unspecified: Secondary | ICD-10-CM

## 2022-11-26 DIAGNOSIS — I5032 Chronic diastolic (congestive) heart failure: Secondary | ICD-10-CM

## 2022-11-26 DIAGNOSIS — I340 Nonrheumatic mitral (valve) insufficiency: Secondary | ICD-10-CM | POA: Diagnosis not present

## 2022-11-26 DIAGNOSIS — K4030 Unilateral inguinal hernia, with obstruction, without gangrene, not specified as recurrent: Secondary | ICD-10-CM | POA: Diagnosis not present

## 2022-11-26 DIAGNOSIS — R188 Other ascites: Secondary | ICD-10-CM

## 2022-11-26 DIAGNOSIS — Z8673 Personal history of transient ischemic attack (TIA), and cerebral infarction without residual deficits: Secondary | ICD-10-CM | POA: Diagnosis not present

## 2022-11-26 DIAGNOSIS — R001 Bradycardia, unspecified: Secondary | ICD-10-CM

## 2022-11-26 DIAGNOSIS — I480 Paroxysmal atrial fibrillation: Secondary | ICD-10-CM

## 2022-11-26 LAB — BASIC METABOLIC PANEL
Anion gap: 6 (ref 5–15)
BUN: 18 mg/dL (ref 8–23)
CO2: 15 mmol/L — ABNORMAL LOW (ref 22–32)
Calcium: 6.9 mg/dL — ABNORMAL LOW (ref 8.9–10.3)
Chloride: 116 mmol/L — ABNORMAL HIGH (ref 98–111)
Creatinine, Ser: 1.39 mg/dL — ABNORMAL HIGH (ref 0.61–1.24)
GFR, Estimated: 50 mL/min — ABNORMAL LOW (ref >60)
Glucose, Bld: 102 mg/dL — ABNORMAL HIGH (ref 70–99)
Potassium: 4 mmol/L (ref 3.5–5.1)
Sodium: 137 mmol/L (ref 135–145)

## 2022-11-26 LAB — TYPE AND SCREEN
ABO/RH(D): O NEG
Antibody Screen: NEGATIVE

## 2022-11-26 LAB — IRON AND TIBC
Iron: 20 ug/dL — ABNORMAL LOW (ref 45–182)
Saturation Ratios: 12 % — ABNORMAL LOW (ref 17.9–39.5)
TIBC: 162 ug/dL — ABNORMAL LOW (ref 250–450)
UIBC: 142 ug/dL

## 2022-11-26 LAB — CBC WITH DIFFERENTIAL/PLATELET
Abs Immature Granulocytes: 0.01 10*3/uL (ref 0.00–0.07)
Basophils Absolute: 0 10*3/uL (ref 0.0–0.1)
Basophils Relative: 0 %
Eosinophils Absolute: 0.2 10*3/uL (ref 0.0–0.5)
Eosinophils Relative: 6 %
HCT: 24.3 % — ABNORMAL LOW (ref 39.0–52.0)
Hemoglobin: 7.3 g/dL — ABNORMAL LOW (ref 13.0–17.0)
Immature Granulocytes: 0 %
Lymphocytes Relative: 10 %
Lymphs Abs: 0.3 10*3/uL — ABNORMAL LOW (ref 0.7–4.0)
MCH: 29.4 pg (ref 26.0–34.0)
MCHC: 30 g/dL (ref 30.0–36.0)
MCV: 98 fL (ref 80.0–100.0)
Monocytes Absolute: 0.3 10*3/uL (ref 0.1–1.0)
Monocytes Relative: 9 %
Neutro Abs: 2.4 10*3/uL (ref 1.7–7.7)
Neutrophils Relative %: 75 %
Platelets: 66 10*3/uL — ABNORMAL LOW (ref 150–400)
RBC: 2.48 MIL/uL — ABNORMAL LOW (ref 4.22–5.81)
RDW: 17 % — ABNORMAL HIGH (ref 11.5–15.5)
WBC: 3.2 10*3/uL — ABNORMAL LOW (ref 4.0–10.5)
nRBC: 0 % (ref 0.0–0.2)

## 2022-11-26 LAB — BPAM RBC
Blood Product Expiration Date: 202405032359
ISSUE DATE / TIME: 202404181324
Unit Type and Rh: 9500

## 2022-11-26 LAB — RETICULOCYTES
Immature Retic Fract: 25.3 % — ABNORMAL HIGH (ref 2.3–15.9)
RBC.: 2.19 MIL/uL — ABNORMAL LOW (ref 4.22–5.81)
Retic Count, Absolute: 78.4 10*3/uL (ref 19.0–186.0)
Retic Ct Pct: 3.6 % — ABNORMAL HIGH (ref 0.4–3.1)

## 2022-11-26 LAB — PREPARE RBC (CROSSMATCH)

## 2022-11-26 LAB — FERRITIN: Ferritin: 147 ng/mL (ref 24–336)

## 2022-11-26 LAB — FOLATE: Folate: 12.4 ng/mL (ref >5.9)

## 2022-11-26 LAB — VITAMIN B12: Vitamin B-12: 348 pg/mL (ref 180–914)

## 2022-11-26 LAB — ALBUMIN: Albumin: 2.1 g/dL — ABNORMAL LOW (ref 3.5–5.0)

## 2022-11-26 MED ORDER — FUROSEMIDE 20 MG PO TABS
20.0000 mg | ORAL_TABLET | Freq: Every day | ORAL | Status: DC
  Administered 2022-11-26 – 2022-11-27 (×2): 20 mg via ORAL
  Filled 2022-11-26 (×2): qty 1

## 2022-11-26 MED ORDER — SODIUM CHLORIDE 0.9% IV SOLUTION: Freq: Once | INTRAVENOUS | Status: AC

## 2022-11-26 MED ORDER — SPIRONOLACTONE 25 MG PO TABS
25.0000 mg | ORAL_TABLET | Freq: Every day | ORAL | Status: DC
  Administered 2022-11-26 – 2022-11-27 (×2): 25 mg via ORAL
  Filled 2022-11-26 (×2): qty 1

## 2022-11-26 MED ORDER — CARVEDILOL 3.125 MG PO TABS
3.1250 mg | ORAL_TABLET | Freq: Two times a day (BID) | ORAL | Status: DC
  Administered 2022-11-26 – 2022-11-27 (×2): 3.125 mg via ORAL
  Filled 2022-11-26 (×2): qty 1

## 2022-11-26 NOTE — Discharge Instructions (Signed)
[facility] _______[facility]  UMBILICAL OR INGUINAL HERNIA REPAIR: POST OP INSTRUCTIONS  Always review your discharge instruction sheet given to you by the facility where your surgery was performed. IF YOU HAVE DISABILITY OR FAMILY LEAVE FORMS, YOU MUST BRING THEM TO THE OFFICE FOR PROCESSING.   DO NOT GIVE THEM TO YOUR DOCTOR.  1. A  prescription for pain medication may be given to you upon discharge.  Take your pain medication as prescribed, if needed.  If narcotic pain medicine is not needed, then you may take acetaminophen (Tylenol) or ibuprofen (Advil) as needed. 2. Take your usually prescribed medications unless otherwise directed. If you need a refill on your pain medication, please contact your pharmacy.  They will contact our office to request authorization. Prescriptions will not be filled after 5 pm or on week-ends. 3. You should follow a light diet the first 24 hours after arrival home, such as soup and crackers, etc.  Be sure to include lots of fluids daily.  Resume your normal diet the day after surgery. 4.Most patients will experience some swelling and bruising around the umbilicus or in the groin and scrotum.  Ice packs and reclining will help.  Swelling and bruising can take several days to resolve.  6. It is common to experience some constipation if taking pain medication after surgery.  Increasing fluid intake and taking a stool softener (such as Colace) will usually help or prevent this problem from occurring.  A mild laxative (Milk of Magnesia or Miralax) should be taken according to package directions if there are no bowel movements after 48 hours. 7. Unless discharge instructions indicate otherwise, you may remove your bandages 24-48 hours after surgery, and you may shower at that time.  You may have steri-strips (small skin tapes) in place directly over the incision.  These strips should be left on the skin for 7-10 days.  If your surgeon used skin glue on the  incision, you may shower in 24 hours.  The glue will flake off over the next 2-3 weeks.  Any sutures or staples will be removed at the office during your follow-up visit. 8. ACTIVITIES:  You may resume regular (light) daily activities beginning the next day--such as daily self-care, walking, climbing stairs--gradually increasing activities as tolerated.  You may have sexual intercourse when it is comfortable.  Refrain from any heavy lifting or straining until approved by your doctor.  a.You may drive when you are no longer taking prescription pain medication, you can comfortably wear a seatbelt, and you can safely maneuver your car and apply brakes. b.RETURN TO WORK:   _____________________________________________  9.You should see your doctor in the office for a follow-up appointment approximately 2-3 weeks after your surgery.  Make sure that you call for this appointment within a day or two after you arrive home to insure a convenient appointment time. 10.OTHER INSTRUCTIONS: _________________________    _____________________________________  WHEN TO CALL YOUR DOCTOR: Fever over 101.0 Inability to urinate Nausea and/or vomiting Extreme swelling or bruising Continued bleeding from incision. Increased pain, redness, or drainage from the incision  The clinic staff is available to answer your questions during regular business hours.  Please don't hesitate to call and ask to speak to one of the nurses for clinical concerns.  If you have a medical emergency, go to the nearest emergency room or call 911.  A surgeon from [facility] is always on call at the hospital   [address], [address],   [place], [place]  [address] ?  [address] [address], [place], [place]   [address] [phone] ? [phone] ? FAX [phone] Web site: [email or web address]  

## 2022-11-26 NOTE — Care Management Important Message (Signed)
Important Message  Patient Details  Name: Trevor Murphy MRN: [id number] Date of Birth: 05/18/1935   Medicare Important Message Given:  Yes     Renie Ora 11/26/2022, 9:49 AM

## 2022-11-26 NOTE — Progress Notes (Signed)
Progress Note  6 Days Post-Op  Subjective: He has had 3 bowel movements since yesterday only one of which had blood. Tolerated dys3 diet without abd pain, nausea, vomiting.   Brother at bedside  Objective: Vital signs in last 24 hours: Temp:  [97.3 F (36.3 C)-97.9 F (36.6 C)] 97.9 F (36.6 C) (04/18 0821) Pulse Rate:  [57-89] 89 (04/18 0856) Resp:  [15-20] 17 (04/18 0900) BP: (140-172)/(69-96) 158/82 (04/18 0900) SpO2:  [99 %-100 %] 99 % (04/18 0821) Weight:  [65.5 kg] 65.5 kg (04/18 0402) Last BM Date : 11/24/22  Intake/Output from previous day: 04/17 0701 - 04/18 0700 In: 1898.9 [P.O.:720; I.V.:1178.9] Out: 200 [Urine:200] Intake/Output this shift: No intake/output data recorded.  PE: General: pleasant, WD, male who is laying in bed in NAD Heart: regular rate Lungs: Respiratory effort nonlabored on room air Abd: soft, ND, +BS, mild TTP over ventral hernia which is soft and reducible. Incision left groin with staples c/d/i MSK: all 4 extremities are symmetrical with no cyanosis, clubbing, or edema. Skin: warm and dry Psych: A&Ox3 with an appropriate affect.    Lab Results:  Recent Labs    11/25/22 0550 11/26/22 0113  WBC 3.7* 3.2*  HGB 8.0* 7.3*  HCT 24.5* 24.3*  PLT 67* 66*    BMET Recent Labs    11/24/22 1029 11/26/22 0113  NA 139 137  K 4.5 4.0  CL 116* 116*  CO2 17* 15*  GLUCOSE 95 102*  BUN 27* 18  CREATININE 1.46* 1.39*  CALCIUM 7.3* 6.9*    PT/INR No results for input(s): "LABPROT", "INR" in the last 72 hours.  CMP     Component Value Date/Time   NA 137 11/26/2022 0113   NA 138 06/18/2022 1545   K 4.0 11/26/2022 0113   CL 116 (H) 11/26/2022 0113   CO2 15 (L) 11/26/2022 0113   GLUCOSE 102 (H) 11/26/2022 0113   BUN 18 11/26/2022 0113   BUN 13 06/18/2022 1545   CREATININE 1.39 (H) 11/26/2022 0113   CREATININE 3.07 (HH) 06/05/2022 1505   CALCIUM 6.9 (L) 11/26/2022 0113   PROT 6.9 11/20/2022 1419   PROT 6.5 04/20/2022 1106    ALBUMIN 2.1 (L) 11/26/2022 0113   ALBUMIN 3.0 (L) 04/20/2022 1106   AST 29 11/20/2022 1419   AST 28 06/05/2022 1505   ALT 21 11/20/2022 1419   ALT 21 06/05/2022 1505   ALKPHOS 112 11/20/2022 1419   BILITOT 1.1 11/20/2022 1419   BILITOT 1.4 (H) 06/05/2022 1505   GFRNONAA 50 (L) 11/26/2022 0113   GFRNONAA 19 (L) 06/05/2022 1505   GFRAA 40 (L) 08/08/2020 1430   Lipase     Component Value Date/Time   LIPASE 40 11/20/2022 1419       Studies/Results: No results found.  Anti-infectives: Anti-infectives (From admission, onward)    Start     Dose/Rate Route Frequency Ordered Stop   11/21/22 0600  piperacillin-tazobactam (ZOSYN) IVPB 3.375 g  Status:  Discontinued        3.375 g 12.5 mL/hr over 240 Minutes Intravenous Every 8 hours 11/20/22 1818 11/21/22 1048   11/20/22 1830  piperacillin-tazobactam (ZOSYN) IVPB 3.375 g        3.375 g 100 mL/hr over 30 Minutes Intravenous  Once 11/20/22 1817 11/20/22 1952        Assessment/Plan Left obstructing and gangrenous inguinal hernia  POD6 s/p open Left obstructing and gangrenous inguinal hernia repair, small bowel resection with anastomosis 11/20/2022 Dr. Sheliah Hatch - tachycardia improved -  significant drop in hgb with large volume maroon stool 4/14 am. S/p 2 u RBC 4/14.  hgb - 7.3 this am - 1 uprbc ordered per primary. Suspect SB anastomotic bleed which should be self limiting. maroon Bms decreasing. Continue to trend - thrombocytopenia overall stable. PT INR stable from prior - CT abd/pelvis wo contrast 4/14 with post surgical changes, stable mod ascites, no evidence of acute hematoma. No hemoperitoneum - GI consulted and no colonoscopy at this time  Cont dys3, transfusion today and cont to trend hgb  FEN: dys3/mechanical soft (edentulous and does not have dentures here) ID: zosyn periop VTE: hold in setting of acute bleeding  Per primary Chronic diastolic CHF/severe MR History of cryptogenic stroke Hepatic cirrhosis with  ascites AKI on CKD stage IIIb Essential hypertension Chronic thrombocytopenia Bladder lesion: Pancreatic head lesion A fib    LOS: 6 days   Trevor Murphy, [facility] Surgery 11/26/2022, 10:38 AM Please see Amion for pager number during day hours 7:00am-4:30pm

## 2022-11-26 NOTE — Progress Notes (Signed)
PROGRESS NOTE  Trevor Murphy ZOX:[id number] DOB: 1934-12-23   PCP: [name], FNP  Patient is from: Home  DOA: 11/20/2022 LOS: 6  Chief complaints Chief Complaint  Patient presents with   Abdominal Pain   Emesis   Nausea     Brief Narrative / Interim history: 88 year old M with PMH of diastolic CHF, CKD-3B, PAF not on AC, bradycardia, severe MR, cryptogenic stroke s/p ILR, hepatic cirrhosis and thrombocytopenia presenting to ED due to abdominal pain, nausea, vomiting and found to have incarcerated left inguinal hernia for which he he had surgical repair.  Hospital course complicated by ABLA and BRB with hemoglobin dropping from 10-5.9 postoperatively.  Hemoccult was positive.  Patient was transfused 2 units of blood with appropriate response.  GI consulted and recommended conservative management, and signed off.  Since then, hemoglobin slowly drifted down to 7.3 although patient has not had overt bleeding.  Transfused additional 1 unit  Subjective: Seen and examined earlier this morning.  No major events overnight of this morning.  No complaints.  Per RN, he just had a large normal bowel movement without melena or hematochezia.  Patient denies chest pain, dyspnea or palpitation but feels weak.  Denies abdominal pain.  Patient's brother at bedside.  Objective: Vitals:   11/26/22 1227 11/26/22 1329 11/26/22 1330 11/26/22 1355  BP: (!) 144/76 (!) 159/82 (!) 163/78 (!) 155/70  Pulse: 92  93 90  Resp: Temp:   (!) 97.4 F (36.3 C) 98 F (36.7 C)  TempSrc:   Oral Oral  SpO2:   100% 100%  Weight:      Height:        Examination:  GENERAL: Appears frail.  No apparent distress. HEENT: MMM.  Vision and hearing grossly intact.  NECK: Supple.  No apparent JVD.  RESP:  No IWOB.  Fair aeration bilaterally. CVS: Tachycardic to 110s.  2/6 SEM over RUSB, LUSB ABD/GI/GU: BS+. Abd soft, NTND.  MSK/EXT:  Moves extremities.  Significant muscle mass and subcu fat loss. SKIN:  no apparent skin lesion or wound NEURO: Awake, alert and oriented appropriately.  No apparent focal neuro deficit. PSYCH: Calm. Normal affect.   Procedures:  4/12-left hernia repair by Dr. Alvan Dame  Microbiology summarized: None  Assessment and plan: Principal Problem:   Incarcerated left inguinal hernia Active Problems:   Cirrhosis of liver with ascites   Chronic kidney disease, stage 3b   Paroxysmal atrial fibrillation   Chronic diastolic CHF (congestive heart failure)   Essential hypertension   Sinus bradycardia   History of CVA (cerebrovascular accident)   Thrombocytopenia   Severe mitral regurgitation  ABLA due to BRBPR: Likely diverticular bleed.  Baseline Hgb 10-11.  Dropped to 5.7 on 4/14.  Hemoccult positive.  Has history of liver cirrhosis with pancytopenia.  GI recommended conservative management and signed off.  Patient was transfused 2 units with appropriate response on 4/14.  Hgb drifted down to 7.3 again. Recent Labs    11/22/22 1029 11/22/22 1500 11/22/22 2149 11/23/22 0801 11/23/22 1158 11/23/22 1735 11/24/22 0543 11/24/22 1738 11/25/22 0550 11/26/22 0113  HGB 8.2* 8.3* 8.7* 8.7* 8.6* 8.3* 7.5* 8.1* 8.0* 7.3*  -Check anemia panel -Transfuse additional 1 unit -CT angio for overt bleeding  Incarcerated left inguinal hernia:s/p open left inguinal hernia repair by Dr. Alvan Dame on 4/12.  Doing well.   Paroxysmal A-fib: Not on AC due to GI bleed.  Not on rate control due to bradycardia -Continue monitoring -Optimize electrolytes  Chronic diastolic CHF/severe MR: Recent TTE with LVEF of 55 to 60%.  Appears compensated.  On Aldactone and [name] outpatient.  Aldactone discontinued due to hyperkalemia.  No cardiopulmonary symptoms. -Start low-dose Lasix and Aldactone. -Monitor INO, daily weight, renal functions and electrolytes.  Hepatic cirrhosis with ascites: CT showed small to moderate ascites.  -Start low-dose Lasix and Aldactone. -Start low-dose  Coreg.  History of cryptogenic stroke: has ILR in place, recent review shows episodes of bradycardia as an outpatient.  Off of anticoagulation as above.  AKI on CKD stage IIIb: Baseline Cr 1.5. Recent Labs    09/25/22 0323 09/26/22 0222 09/27/22 0106 09/28/22 0206 11/20/22 1419 11/21/22 0101 11/22/22 0159 11/23/22 0801 11/24/22 1029 11/26/22 0113  BUN 16 15 17 18  26* 26* 38* 32* 27* 18  CREATININE 1.82* 1.80* 1.81* 2.04* 1.60* 1.51* 1.81* 1.57* 1.46* 1.39*  -Continue monitoring    Essential hypertension: BP slightly elevated. -Cardiac meds and diuretics as above  Pancytopenia: Likely due to liver cirrhosis. -Continue monitoring   Bladder lesion: 18 mm polypoid soft tissue lesion posterior bladder base noted on CT imaging.  Potentially median lobe hypertrophy of the prostate gland although urothelial lesion not excluded per radiology read.  Findings discussed with patient's son by previous provider.  I have also discussed this with patient and patient's brother..   -Outpatient follow-up with urology   Pancreatic head lesion:: 6 mm hypodensity in pancreatic head noted on CT imaging.  This is felt to be a cyst or pseudocyst per radiology read.   -Consider follow-up CT abdomen with contrast in 2 years pending goals of care.  Physical deconditioning -PT/OT   Body mass index is 20.43 kg/m.          DVT prophylaxis:  SCDs Start: 11/20/22 1938  Code Status: DNR/DNI Family Communication: Updated patient's brother at bedside Level of care: Progressive Status is: Inpatient Remains inpatient appropriate because: ABLA   Final disposition: Likely home with home health once medically cleared Consultants:  General surgery Gastroenterology  55 minutes with more than 50% spent in reviewing records, counseling patient/family and coordinating care.   Sch Meds:  Scheduled Meds:  brinzolamide  1 drop Both Eyes TID   And   brimonidine  1 drop Both Eyes TID   finasteride   5 mg Oral Daily   latanoprost  1 drop Both Eyes QHS   sodium chloride flush  3 mL Intravenous Q12H   Continuous Infusions:  sodium chloride 100 mL/hr at 11/25/22 1925   PRN Meds:.acetaminophen **OR** acetaminophen, hydrALAZINE, HYDROmorphone (DILAUDID) injection, ondansetron **OR** ondansetron (ZOFRAN) IV, traMADol  Antimicrobials: Anti-infectives (From admission, onward)    Start     Dose/Rate Route Frequency Ordered Stop   11/21/22 0600  piperacillin-tazobactam (ZOSYN) IVPB 3.375 g  Status:  Discontinued        3.375 g 12.5 mL/hr over 240 Minutes Intravenous Every 8 hours 11/20/22 1818 11/21/22 1048   11/20/22 1830  piperacillin-tazobactam (ZOSYN) IVPB 3.375 g        3.375 g 100 mL/hr over 30 Minutes Intravenous  Once 11/20/22 1817 11/20/22 1952        I have personally reviewed the following labs and images: CBC: Recent Labs  Lab 11/23/22 1735 11/24/22 0543 11/24/22 1738 11/25/22 0550 11/26/22 0113  WBC 6.0 3.6* 4.4 3.7* 3.2*  NEUTROABS 4.7 2.8 3.3 2.6 2.4  HGB 8.3* 7.5* 8.1* 8.0* 7.3*  HCT 25.2* 22.0* 24.8* 24.5* 24.3*  MCV 88.7 88.7 90.2 91.1 98.0  PLT 72* 58* 64*  67* 66*   BMP &GFR Recent Labs  Lab 11/21/22 0101 11/21/22 0405 11/22/22 0159 11/22/22 1029 11/23/22 0801 11/24/22 1029 11/26/22 0113  NA 136  --  134*  --  138 139 137  K 6.4*   < > 6.2* 5.4* 5.0 4.5 4.0  CL 112*  --  112*  --  115* 116* 116*  CO2 15*  --  15*  --  19* 17* 15*  GLUCOSE 159*  --  100*  --  105* 95 102*  BUN 26*  --  38*  --  32* 27* 18  CREATININE 1.51*  --  1.81*  --  1.57* 1.46* 1.39*  CALCIUM 8.1*  --  7.3*  --  7.5* 7.3* 6.9*   < > = values in this interval not displayed.   Estimated Creatinine Clearance: 36 mL/min (A) (by C-G formula based on SCr of 1.39 mg/dL (H)). Liver & Pancreas: Recent Labs  Lab 11/20/22 1419 11/26/22 0113  AST 29  --   ALT 21  --   ALKPHOS 112  --   BILITOT 1.1  --   PROT 6.9  --   ALBUMIN 2.8* 2.1*   Recent Labs  Lab 11/20/22 1419   LIPASE 40   No results for input(s): "AMMONIA" in the last 168 hours. Diabetic: No results for input(s): "HGBA1C" in the last 72 hours. No results for input(s): "GLUCAP" in the last 168 hours. Cardiac Enzymes: No results for input(s): "CKTOTAL", "CKMB", "CKMBINDEX", "TROPONINI" in the last 168 hours. No results for input(s): "PROBNP" in the last 8760 hours. Coagulation Profile: Recent Labs  Lab 11/20/22 1626 11/22/22 2149  INR 1.4* 1.3*   Thyroid Function Tests: No results for input(s): "TSH", "T4TOTAL", "FREET4", "T3FREE", "THYROIDAB" in the last 72 hours. Lipid Profile: No results for input(s): "CHOL", "HDL", "LDLCALC", "TRIG", "CHOLHDL", "LDLDIRECT" in the last 72 hours. Anemia Panel: Recent Labs    11/25/22 0550 11/26/22 0113  VITAMINB12 348  --   FOLATE  --  12.4  FERRITIN 147  --   TIBC 162*  --   IRON 20*  --   RETICCTPCT  --  3.6*   Urine analysis:    Component Value Date/Time   COLORURINE YELLOW 11/20/2022 1606   APPEARANCEUR CLEAR 11/20/2022 1606   LABSPEC 1.019 11/20/2022 1606   PHURINE 5.0 11/20/2022 1606   GLUCOSEU NEGATIVE 11/20/2022 1606   HGBUR SMALL (A) 11/20/2022 1606   BILIRUBINUR NEGATIVE 11/20/2022 1606   BILIRUBINUR Negative 04/20/2022 1137   KETONESUR NEGATIVE 11/20/2022 1606   PROTEINUR 100 (A) 11/20/2022 1606   UROBILINOGEN >=8.0 (A) 04/20/2022 1137   UROBILINOGEN 2.0 (H) 10/17/2021 1222   NITRITE NEGATIVE 11/20/2022 1606   LEUKOCYTESUR NEGATIVE 11/20/2022 1606   Sepsis Labs: Invalid input(s): "PROCALCITONIN", "LACTICIDVEN"  Microbiology: No results found for this or any previous visit (from the past 240 hour(s)).  Radiology Studies: No results found.    Lurlean Kernen T. Javion Holmer [facility]  If 7PM-7AM, please contact night-coverage [email or web address] 11/26/2022, 2:06 PM

## 2022-11-26 NOTE — Telephone Encounter (Signed)
Patients wife called to let us know patient is in the hospital and wanted to cancel upcoming appointments.

## 2022-11-27 ENCOUNTER — Ambulatory Visit: Payer: Self-pay

## 2022-11-27 DIAGNOSIS — I340 Nonrheumatic mitral (valve) insufficiency: Secondary | ICD-10-CM | POA: Diagnosis not present

## 2022-11-27 DIAGNOSIS — K4030 Unilateral inguinal hernia, with obstruction, without gangrene, not specified as recurrent: Secondary | ICD-10-CM | POA: Diagnosis not present

## 2022-11-27 DIAGNOSIS — K7460 Unspecified cirrhosis of liver: Secondary | ICD-10-CM | POA: Diagnosis not present

## 2022-11-27 DIAGNOSIS — R001 Bradycardia, unspecified: Secondary | ICD-10-CM | POA: Diagnosis not present

## 2022-11-27 LAB — CBC
HCT: 28.8 % — ABNORMAL LOW (ref 39.0–52.0)
Hemoglobin: 9.6 g/dL — ABNORMAL LOW (ref 13.0–17.0)
MCH: 29.6 pg (ref 26.0–34.0)
MCHC: 33.3 g/dL (ref 30.0–36.0)
MCV: 88.9 fL (ref 80.0–100.0)
Platelets: 67 10*3/uL — ABNORMAL LOW (ref 150–400)
RBC: 3.24 MIL/uL — ABNORMAL LOW (ref 4.22–5.81)
RDW: 17.2 % — ABNORMAL HIGH (ref 11.5–15.5)
WBC: 4.6 10*3/uL (ref 4.0–10.5)
nRBC: 0 % (ref 0.0–0.2)

## 2022-11-27 LAB — COMPREHENSIVE METABOLIC PANEL
ALT: 20 U/L (ref 0–44)
AST: 25 U/L (ref 15–41)
Albumin: 2.2 g/dL — ABNORMAL LOW (ref 3.5–5.0)
Alkaline Phosphatase: 71 U/L (ref 38–126)
Anion gap: 4 — ABNORMAL LOW (ref 5–15)
BUN: 14 mg/dL (ref 8–23)
CO2: 17 mmol/L — ABNORMAL LOW (ref 22–32)
Calcium: 7 mg/dL — ABNORMAL LOW (ref 8.9–10.3)
Chloride: 116 mmol/L — ABNORMAL HIGH (ref 98–111)
Creatinine, Ser: 1.39 mg/dL — ABNORMAL HIGH (ref 0.61–1.24)
GFR, Estimated: 50 mL/min — ABNORMAL LOW (ref >60)
Glucose, Bld: 99 mg/dL (ref 70–99)
Potassium: 4.1 mmol/L (ref 3.5–5.1)
Sodium: 137 mmol/L (ref 135–145)
Total Bilirubin: 1.1 mg/dL (ref 0.3–1.2)
Total Protein: 5.3 g/dL — ABNORMAL LOW (ref 6.5–8.1)

## 2022-11-27 LAB — TYPE AND SCREEN: Unit division: 0

## 2022-11-27 LAB — PHOSPHORUS: Phosphorus: 2.8 mg/dL (ref 2.5–4.6)

## 2022-11-27 LAB — MAGNESIUM: Magnesium: 1.7 mg/dL (ref 1.7–2.4)

## 2022-11-27 MED ORDER — CARVEDILOL 6.25 MG PO TABS: 6.2500 mg | ORAL_TABLET | Freq: Two times a day (BID) | ORAL | 1 refills | Status: DC

## 2022-11-27 MED ORDER — TRAMADOL HCL 50 MG PO TABS: 50.0000 mg | ORAL_TABLET | Freq: Four times a day (QID) | ORAL | 0 refills | Status: DC | PRN

## 2022-11-27 MED ORDER — FUROSEMIDE 20 MG PO TABS: 20.0000 mg | ORAL_TABLET | Freq: Every day | ORAL | 1 refills | Status: DC

## 2022-11-27 MED ORDER — LACTULOSE 10 GM/15ML PO SOLN: 10.0000 g | Freq: Three times a day (TID) | ORAL | 0 refills | Status: DC | PRN

## 2022-11-27 NOTE — TOC Transition Note (Signed)
Transition of Care (TOC) - CM/SW Discharge Note Donn Pierini RN, BSN Transitions of Care Unit 4E- RN Case Manager See Treatment Team for direct phone #   Patient Details  Name: Trevor Murphy MRN: [id number] Date of Birth: 1934/09/20  Transition of Care [facility]) CM/SW Contact:  Darrold Span, RN Phone Number: 11/27/2022, 10:22 AM   Clinical Narrative:    Pt stable for transition home today, HHPT/OT has been set up per previous CM with [facility]. CM has sent msg to liaison of discharge for start of care.   [facility] has delivered [facility] to room for home per previous CM.   Family to transport home, no further TOC needs noted.     Final next level of care: Home w Home Health Services Barriers to Discharge: Barriers Resolved   Patient Goals and CMS Choice CMS Medicare.gov Compare Post Acute Care list provided to:: Patient Choice offered to / list presented to : Patient  Discharge Placement                 Home w/ [facility]        Discharge Plan and Services Additional resources added to the After Visit Summary for     Discharge Planning Services: CM Consult Post Acute Care Choice: Home Health, Durable Medical Equipment          DME Arranged: Bedside commode DME Agency: [facility] Date DME Agency Contacted: 11/23/22   Representative spoke with at DME Agency: Barbara Cower HH Arranged: PT, OT [facility] Agency: [facility] Date [facility] Agency Contacted: 11/27/22 Time HH Agency Contacted: 1021 Representative spoke with at [facility] Agency: Tresa Endo  Social Determinants of Health (SDOH) Interventions SDOH Screenings   Food Insecurity: No Food Insecurity (11/20/2022)  Housing: Low Risk  (11/20/2022)  Transportation Needs: No Transportation Needs (11/20/2022)  Utilities: Not At Risk (11/20/2022)  Depression (PHQ2-9): Low Risk  (10/21/2022)  Financial Resource Strain: Low Risk  (10/21/2022)  Physical Activity: Inactive (10/21/2022)  Social Connections: Moderately Isolated (04/15/2021)  Stress: No  Stress Concern Present (10/21/2022)  Tobacco Use: Low Risk  (11/21/2022)     Readmission Risk Interventions    11/27/2022   10:22 AM 11/27/2022   10:21 AM  Readmission Risk Prevention Plan  Transportation Screening  Complete  PCP or Specialist Appt within 3-5 Days Complete   HRI or Home Care Consult  Complete  Social Work Consult for Recovery Care Planning/Counseling  Complete  Palliative Care Screening  Not Applicable  Medication Review Oceanographer)  Complete

## 2022-11-27 NOTE — Patient Outreach (Signed)
Care Coordination   Follow Up Visit Note   11/27/2022 Name: Trevor Murphy MRN: [id number] DOB: 03-19-1935  Trevor Murphy is a 88 y.o. year old male who sees [name], FNP for primary care. I spoke with patient's sister Baxter Flattery by phone today.  What matters to the patients health and wellness today?  Sister Sebastian Ache will schedule patient's post hospital follow up appointments as directed.     Goals Addressed             This Visit's Progress    RN Care Coordination Activities: further follow up needed       Care Coordination Interventions: Completed successful outbound call with sister [facility]  Evaluation of current treatment plan related to Incarcerated left inguinal hernia and patient's adherence to plan as established by provider Determined patient experienced acute vomiting with lower abdominal pain, he was taken to [facility] and admitted for evidence of an incarcerated left hernia   Discussed and reviewed with sister [name], patient was discharged to his home today with the help of their brother  Reviewed and discussed with [name] discharge recommendations including;  Pt stable for transition home today, HHPT/OT has been set up per previous CM with [facility]. CM has sent msg to liaison of discharge for start of care.  [facility] has delivered [facility] to room for home per previous CM.   Follow up with PCP and general surgery as below  Check BP, CMP and CBC at follow-up   Follow-up Information       [facility] Follow up.   Specialty: Home Health Services Why: The home health agency will contact you for the first home visit. Contact information: [address] [place] [address] [phone]      Kinsinger, De Blanch, MD. Go on 12/16/2022.   Specialty: General Surgery Why: 5/8 at 11:10am. Please arrive 15 minutes early to complete check in, and bring photo ID and insurance card. Contact information: [address] [place] [address] [phone]      [facility], [place]. Go on 12/01/2022.   Specialty: General Surgery Why: staple removal scheduled for 4/23 at 3pm. Please arrive 30 minutes early to complete check in, and bring photo ID and insurance card. Contact information: [address] [place] [address] [phone]      [name], FNP. Schedule an appointment as soon as possible for a visit in 1 week(s).   Specialty: General Practice Contact information: [address] [place] [address] [phone]      [facility]. Schedule an appointment as soon as possible for a visit in 4 week(s).   Why: 18mm bladder lesion Contact information: [address] [place] [address] [phone]    Determined sister Sebastian Ache has a good understanding of patient's discharge instructions Instructed sister [name] to notify patient's doctor promptly of new symptoms or concerns    Interventions Today    Flowsheet Row Most Recent Value  Chronic Disease   Chronic disease during today's visit Other  [LEFT INGUINAL HERNIA REPAIR FOR GANGRENE AND SMALL BOWEL RESECTION WITH STINASTIMOSIST]  General Interventions   General Interventions Discussed/Reviewed General Interventions Discussed, General Interventions Reviewed, Doctor Visits, Durable Medical Equipment (DME)  Doctor Visits Discussed/Reviewed Doctor Visits Discussed, Doctor Visits Reviewed, Specialist, PCP  Durable Medical Equipment (DME) Bed side commode, Lift Chair  Exercise Interventions   Exercise Discussed/Reviewed Exercise Reviewed, Exercise  Discussed, Physical Activity  Physical Activity Discussed/Reviewed Physical Activity Discussed, Physical Activity Reviewed  [PT/OT has been ordered]  Education Interventions   Education Provided Provided Education  Provided Verbal Education On Medication, When to see the doctor          SDOH assessments  and interventions completed:  No     Care Coordination Interventions:  Yes, provided   Follow up plan: Follow up call scheduled for 12/25/22 :00 AM    Encounter Outcome:  Pt. Visit Completed

## 2022-11-27 NOTE — Discharge Summary (Signed)
Physician Discharge Summary  Trevor Murphy WUJ:[id number] DOB: [birth year] DOA: 11/20/2022  PCP: [name], FNP  Admit date: 11/20/2022 Discharge date: 11/27/2022 Admitted From: Home Disposition: Home Recommendations for Outpatient Follow-up:  Follow up with PCP and general surgery as below Check BP, CMP and CBC at follow-up Please follow up on the following pending results: None  Home Health: PT/OT Equipment/Devices: Bedside commode  Discharge Condition: Stable CODE STATUS: DNR/DNI  Follow-up Information     [facility] Follow up.   Specialty: Home Health Services Why: The home health agency will contact you for the first home visit. Contact information: [address] [place] [address] [phone]         Kinsinger, De Blanch, MD. Go on 12/16/2022.   Specialty: General Surgery Why: 5/8 at 11:10am. Please arrive 15 minutes early to complete check in, and bring photo ID and insurance card. Contact information: [address] [place] [address] [phone]         [facility], [place]. Go on 12/01/2022.   Specialty: General Surgery Why: staple removal scheduled for 4/23 at 3pm. Please arrive 30 minutes early to complete check in, and bring photo ID and insurance card. Contact information: [address] [place] [address] [phone]        [name], FNP. Schedule an appointment as soon as possible for a visit in 1 week(s).   Specialty: General Practice Contact information: [address] [place] [address] [phone]         [facility]. Schedule an appointment as soon as possible for a visit in 4 week(s).   Why: 18mm bladder lesion Contact information: [address] [place] [address] [phone]                Hospital course 88 year old M with PMH of diastolic CHF, CKD-3B, PAF not on AC,  bradycardia, severe MR, cryptogenic stroke s/p ILR, hepatic cirrhosis and thrombocytopenia presenting to ED due to abdominal pain, nausea, vomiting and found to have incarcerated left inguinal hernia for which he he had surgical repair.  Hospital course complicated by ABLA and BRB with hemoglobin dropping from 10-5.9 postoperatively.  Hemoccult was positive.  Patient was transfused 2 units of blood with appropriate response.  GI consulted and recommended conservative management, and signed off.  Since then, hemoglobin slowly drifted down to 7.3 although patient has not had overt bleeding.  Transfused additional 1 unit, and hemoglobin improved to 9.6 the following day.  Patient was started on Lasix and low-dose Coreg in addition to home Aldactone for liver cirrhosis.  Home health PT/OT and bedside commode ordered as recommended by therapy.  Cleared for discharge by general surgery and GI. Outpatient follow-up as above.  Patient has been advised to avoid NSAID.   See individual problem list below for more.   Problems addressed during this hospitalization Principal Problem:   Incarcerated left inguinal hernia Active Problems:   Cirrhosis of liver with ascites   Chronic kidney disease, stage 3b   Paroxysmal atrial fibrillation   Chronic diastolic CHF (congestive heart failure)   Essential hypertension   Sinus bradycardia   History of CVA (cerebrovascular accident)   Thrombocytopenia   Severe mitral regurgitation   ABLA due to BRBPR: Likely diverticular bleed.  Baseline Hgb 10-11.  Dropped to 5.7 on 4/14.  Hemoccult positive.  Has history of liver cirrhosis with pancytopenia.  GI recommended  conservative management and signed off.  Patient was transfused 2 units with appropriate response on 4/14.  Hgb drifted down to 7.3 and transfused additional unit.  Hgb improved to 9.6 the following day.  Anemia panel suggests anemia of chronic disease. Recent Labs    11/22/22 1500 11/22/22 2149  11/23/22 0801 11/23/22 1158 11/23/22 1735 11/24/22 0543 11/24/22 1738 11/25/22 0550 11/26/22 0113 11/27/22 0121  HGB 8.3* 8.7* 8.7* 8.6* 8.3* 7.5* 8.1* 8.0* 7.3* 9.6*  -Check CBC at follow-up.   Incarcerated left inguinal hernia:s/p open left inguinal hernia repair by Dr. Alvan Dame on 4/12.  Doing well. -Outpatient follow-up with general surgery   Paroxysmal A-fib: Not on AC due to GI bleed.  -Started on low-dose Coreg mainly due to BP and cirrhosis   Chronic diastolic CHF/severe MR: Recent TTE with LVEF of 55 to 60%.  Appears compensated.  -Started on low-dose Lasix -Continue home Coreg -Reassess fluid status, renal functions and electrolytes at follow-up   Hepatic cirrhosis with ascites: CT showed small to moderate ascites.  -Started on low-dose Coreg and Lasix in addition to home Aldactone -Reassess fluid status, BP and electrolytes at follow-up -Outpatient follow-up with GI   History of cryptogenic stroke: has ILR in place, recent review shows episodes of bradycardia as an outpatient.  Off of anticoagulation as above.   AKI on CKD stage IIIb: Baseline Cr 1.5.  AKI resolved. Recent Labs    09/26/22 0222 09/27/22 0106 09/28/22 0206 11/20/22 1419 11/21/22 0101 11/22/22 0159 11/23/22 0801 11/24/22 1029 11/26/22 0113 11/27/22 0121  BUN 26* 26* 38* 32* 27* 18 14  CREATININE 1.80* 1.81* 2.04* 1.60* 1.51* 1.81* 1.57* 1.46* 1.39* 1.39*  -Recheck in 1 to 2 weeks  Essential hypertension: BP slightly elevated. -Cardiac meds and diuretics as above   Pancytopenia: Likely due to liver cirrhosis.  Leukopenia resolved.  Anemia improved.  Thrombocytopenia stable. -Recheck CBC at follow-up   Bladder lesion: 18 mm polypoid soft tissue lesion posterior bladder base noted on CT imaging.  Potentially median lobe hypertrophy of the prostate gland although urothelial lesion not excluded per radiology read.  Findings discussed with patient's son by previous provider.  I have  also discussed this with patient and patient's brother..   -Outpatient follow-up with urology   Pancreatic head lesion:: 6 mm hypodensity in pancreatic head noted on CT imaging.  This is felt to be a cyst or pseudocyst per radiology read.   -Consider follow-up CT abdomen with contrast in 2 years pending goals of care.   Physical deconditioning -HH PT/OT and bedside commode ordered.           Time spent 45 minutes  Vital signs Vitals:   11/26/22 2319 11/27/22 0439 11/27/22 0443 11/27/22 0848  BP: (!) 157/74 (!) 158/77    Pulse: 66 74  67  Temp: (!) 97.3 F (36.3 C) (!) 97.4 F (36.3 C)    Resp: Height:      Weight:   68.8 kg   SpO2: 100% 100%  100%  TempSrc: Oral Oral  Oral  BMI (Calculated):   21.45      Discharge exam  GENERAL: Appears frail.  No apparent distress. HEENT: MMM.  Vision and hearing grossly intact.  NECK: Supple.  No apparent JVD.  RESP:  No IWOB.  Fair aeration bilaterally. CVS:  RRR.  2/6 SEM over LLSB. ABD/GI/GU: Irregular rhythm.  Normal rate.  Abd soft, NTND.  MSK/EXT:  Moves extremities.  Significant muscle  mass and subcu fat loss. SKIN: no apparent skin lesion or wound NEURO: Awake and alert. Oriented appropriately.  No apparent focal neuro deficit. PSYCH: Calm. Normal affect.   Discharge Instructions Discharge Instructions     Call MD for:  difficulty breathing, headache or visual disturbances   Complete by: As directed    Call MD for:  extreme fatigue   Complete by: As directed    Call MD for:  persistant dizziness or light-headedness   Complete by: As directed    Call MD for:  severe uncontrolled pain   Complete by: As directed    Call MD for:  temperature >100.4   Complete by: As directed    Diet - low sodium heart healthy   Complete by: As directed    Discharge instructions   Complete by: As directed    It has been a pleasure taking care of you!  You were hospitalized due to left inguinal hernia for which you have  been treated surgically.  You also had rectal bleeding that seems to have subsided.  You have been treated with blood transfusion for blood loss.  Bleeding seems to have subsided.  Avoid any over-the-counter pain medication other than plain Tylenol.  Watch your stool for any signs of bleeding.  Review your new medication list and the directions on your medications before you take them.  Follow-up with your primary care doctor in 1 to 2 weeks or sooner if needed.  Follow-up with general surgery and gastroenterology per their recommendation.  Note that your CT showed 18 mm bladder nodule.  It is unclear what this could be.  Recommend follow-up with urology in the next 3 to 4 weeks.   Take care,   Increase activity slowly   Complete by: As directed       Allergies as of 11/27/2022   No Known Allergies      Medication List     TAKE these medications    acetaminophen 500 MG tablet Commonly known as: TYLENOL Take 1,000 mg by mouth 2 (two) times daily as needed for mild pain or moderate pain (leg pain).   carvedilol 6.25 MG tablet Commonly known as: COREG Take 1 tablet (6.25 mg total) by mouth 2 (two) times daily with a meal.   dapagliflozin propanediol 10 MG Tabs tablet Commonly known as: [name] Take 1 tablet (10 mg total) by mouth daily before breakfast.   ferrous sulfate 325 (65 FE) MG tablet Commonly known as: FeroSul TAKE 1 TABLET(325 MG) BY MOUTH DAILY   finasteride 5 MG tablet Commonly known as: PROSCAR Take 5 mg by mouth daily.   furosemide 20 MG tablet Commonly known as: LASIX Take 1 tablet (20 mg total) by mouth daily.   lactulose 10 GM/15ML solution Commonly known as: CHRONULAC Take 15 mLs (10 g total) by mouth 3 (three) times daily as needed for mild constipation.   Lumigan 0.01 % Soln Generic drug: bimatoprost Place 1 drop into both eyes at bedtime.   Simbrinza 1-0.2 % Susp Generic drug: Brinzolamide-Brimonidine Follow up with your PCP on when to restart  this medication. What changed:  how much to take how to take this when to take this additional instructions   spironolactone 50 MG tablet Commonly known as: ALDACTONE Take 1 tablet (50 mg total) by mouth daily.   traMADol 50 MG tablet Commonly known as: ULTRAM Take 1 tablet (50 mg total) by mouth every 6 (six) hours as needed for moderate pain.  Durable Medical Equipment  (From admission, onward)           Start     Ordered   11/23/22 1625  For home use only DME Bedside commode  Once       Question:  Patient needs a bedside commode to treat with the following condition  Answer:  Weakness   11/23/22 1625            Consultations: General surgery Gastroenterology  Procedures/Studies: Open left inguinal hernia repair   CUP PACEART REMOTE DEVICE CHECK  Result Date: 11/24/2022 ILR summary report received. Battery status OK. Normal device function. 105 brady detections, EGMs consistent with sinus brady and junctional rhythm at 30 bpm.  Not a new finding.  1 false AF detection lasting 2 minutes. No new symptom, tachy, or pause episodes. Monthly summary reports and ROV/PRN C. Kavin Leech, RN, [employer]  CT ABDOMEN PELVIS WO CONTRAST  Result Date: 11/22/2022 CLINICAL DATA:  Abdominal pain, postop incarcerated LEFT inguinal hernia status post surgical repair on 11/20/2022. Acute drop in hemoglobin. Rule out hematoma. EXAM: CT ABDOMEN AND PELVIS WITHOUT CONTRAST TECHNIQUE: Multidetector CT imaging of the abdomen and pelvis was performed following the standard protocol without IV contrast. RADIATION DOSE REDUCTION: This exam was performed according to the departmental dose-optimization program which includes automated exposure control, adjustment of the mA and/or kV according to patient size and/or use of iterative reconstruction technique. COMPARISON:  CT abdomen and pelvis dated 11/20/2022. FINDINGS: Lower chest: Mild bibasilar atelectasis.  Hepatobiliary: No focal liver abnormality is seen. Cholelithiasis. No bile duct dilatation is seen. Pancreas: Unremarkable. No pancreatic ductal dilatation or surrounding inflammatory changes. Spleen: Normal in size without focal abnormality. Adrenals/Urinary Tract: Kidneys are unremarkable without suspicious mass, stone or hydronephrosis. No ureteral or bladder calculi are identified. Stomach/Bowel: No dilated large or small bowel loops. Gas and oral contrast is seen throughout the nondistended large bowel to the rectum. Vascular/Lymphatic: No abdominal aortic aneurysm. No enlarged lymph nodes are seen in the abdomen or pelvis. Reproductive: Prostate is unremarkable. Other: Moderate amount of ascites within the abdomen and pelvis, similar in amount when compared to the preoperative CT of 11/20/2022. No evidence of acute hematoma within the abdomen or pelvis. Surgical changes of LEFT inguinal hernia repair, with staples overlying the LEFT inguinal region. Additional surgical changes are suspected within the RIGHT lower quadrant, corresponding to the op note's description of small-bowel resection with anastomosis. Musculoskeletal: No acute-appearing osseous abnormality. IMPRESSION: 1. Moderate amount of ascites within the abdomen and pelvis, similar in amount when compared to the preoperative CT of 11/20/2022. 2. No evidence of acute hematoma within the abdomen or pelvis. 3. Surgical changes of LEFT inguinal hernia repair, with staples overlying the LEFT inguinal region. Additional surgical changes are suspected within the RIGHT lower quadrant, corresponding to the op note's description of small-bowel resection with anastomosis. 4. Cholelithiasis without evidence of acute cholecystitis. Electronically Signed   By: Bary Richard M.D.   On: 11/22/2022 16:08   CT Abdomen Pelvis W Contrast  Result Date: 11/20/2022 CLINICAL DATA:  Left lower quadrant abdominal pain. Nausea and vomiting. EXAM: CT ABDOMEN AND PELVIS  WITH CONTRAST TECHNIQUE: Multidetector CT imaging of the abdomen and pelvis was performed using the standard protocol following bolus administration of intravenous contrast. RADIATION DOSE REDUCTION: This exam was performed according to the departmental dose-optimization program which includes automated exposure control, adjustment of the mA and/or kV according to patient size and/or use of iterative reconstruction technique. CONTRAST:  60mL OMNIPAQUE  IOHEXOL 350 MG/ML SOLN COMPARISON:  02/23/2022 FINDINGS: Lower chest: Unremarkable. Hepatobiliary: Irregular liver contour suggests cirrhosis. No focal abnormality within the liver parenchyma. Layering sludge/stones noted in the gallbladder. No intrahepatic or extrahepatic biliary dilation. Pancreas: 6 mm hypodensity is identified in the pancreatic head on image 34/3. Pancreas otherwise unremarkable without main duct dilatation. Spleen: No splenomegaly. No focal mass lesion. Adrenals/Urinary Tract: No adrenal nodule or mass. 1-2 mm nonobstructing stone noted lower pole right kidney. No left renal stones. Bilateral renal cysts evident with additional tiny hypodensities in both kidneys too small to characterize but likely benign. No followup imaging is recommended. No evidence for hydroureter. 18 mm polypoid soft tissue lesion noted posterior bladder base, potentially median lobe hypertrophy of the prostate gland although urothelial lesion not excluded. Stomach/Bowel: Stomach is distended with food. Duodenum is normally positioned as is the ligament of Treitz. No small bowel wall thickening. No small bowel dilatation. The terminal ileum is normal. Wall thickening and mural edema noted in the right colon and transverse colon. Diverticular disease noted left colon without diverticulitis. Vascular/Lymphatic: There is moderate atherosclerotic calcification of the abdominal aorta without aneurysm. There is no gastrohepatic or hepatoduodenal ligament lymphadenopathy. No  retroperitoneal or mesenteric lymphadenopathy. Portal vein, superior mesenteric vein, and splenic vein are patent. Prominent esophageal and paraesophageal varices evident. No pelvic sidewall lymphadenopathy. Reproductive: Prostate gland is borderline enlarged. Other: Small to moderate volume ascites around the liver and spleen extending down into the pelvis. Musculoskeletal: Left groin hernia contains a short segment of small bowel and a moderate to large volume of fluid. The small bowel loop in the hernia sac is dilated up to 2.2 cm diameter. There is some mesenteric edema in the small bowel loops adjacent to the herniated segment although no substantial wall thickening or pneumatosis evident. Right groin hernia contains only fluid. IMPRESSION: 1. Left groin hernia contains a short segment of small bowel and a moderate to large volume of fluid. The small bowel loop in the hernia sac is dilated up to 2.2 cm diameter. There is some mesenteric edema in the small bowel loops adjacent to the herniated segment although no substantial wall thickening or pneumatosis evident. Small bowel proximal to the hernia does not appear substantially dilated although incarceration of the herniated segment cannot be excluded. 2. Wall thickening and mural edema in the right colon and transverse colon. Imaging features could be compatible with infectious/inflammatory colitis although given the apparent liver disease and ascites, this finding is nonspecific. 3. 18 mm polypoid soft tissue lesion posterior bladder base, potentially median lobe hypertrophy of the prostate gland although urothelial lesion not excluded. Urology consultation recommended. 4. 6 mm hypodensity in the pancreatic head. This is likely a cyst or pseudocyst. Given patient age and lesion size, follow-up CT abdomen with contrast in 2 years recommended. This recommendation follows ACR consensus guidelines: Management of Incidental Pancreatic Cysts: A White Paper of the  ACR Incidental Findings Committee. J Am Coll Radiol 2017;14:911-923. 5. Cirrhosis with small to moderate volume ascites and prominent esophageal and paraesophageal varices compatible with portal venous hypertension. 6. Cholelithiasis. 7. 1-2 mm nonobstructing stone lower pole right kidney. 8.  Aortic Atherosclerosis (ICD10-I70.0). Electronically Signed   By: Kennith Center M.D.   On: 11/20/2022 17:34       The results of significant diagnostics from this hospitalization (including imaging, microbiology, ancillary and laboratory) are listed below for reference.     Microbiology: No results found for this or any previous visit (from the past 240 hour(s)).  Labs:  CBC: Recent Labs  Lab 11/23/22 1735 11/24/22 0543 11/24/22 1738 11/25/22 0550 11/26/22 0113 11/27/22 0121  WBC 6.0 3.6* 4.4 3.7* 3.2* 4.6  NEUTROABS 4.7 2.8 3.3 2.6 2.4  --   HGB 8.3* 7.5* 8.1* 8.0* 7.3* 9.6*  HCT 25.2* 22.0* 24.8* 24.5* 24.3* 28.8*  MCV 88.7 88.7 90.2 91.1 98.0 88.9  PLT 72* 58* 64* 67* 66* 67*   BMP &GFR Recent Labs  Lab 11/22/22 0159 11/22/22 1029 11/23/22 0801 11/24/22 1029 11/26/22 0113 11/27/22 0121  NA 134*  --  138 139 137 137  K 6.2* 5.4* 5.0 4.5 4.0 4.1  CL 112*  --  115* 116* 116* 116*  CO2 15*  --  19* 17* 15* 17*  GLUCOSE 100*  --  105* 95 102* 99  BUN 38*  --  32* 27* 18 14  CREATININE 1.81*  --  1.57* 1.46* 1.39* 1.39*  CALCIUM 7.3*  --  7.5* 7.3* 6.9* 7.0*  MG  --   --   --   --   --  1.7  PHOS  --   --   --   --   --  2.8   Estimated Creatinine Clearance: 37.8 mL/min (A) (by C-G formula based on SCr of 1.39 mg/dL (H)). Liver & Pancreas: Recent Labs  Lab 11/26/22 0113 11/27/22 0121  AST  --  25  ALT  --  20  ALKPHOS  --  71  BILITOT  --  1.1  PROT  --  5.3*  ALBUMIN 2.1* 2.2*   No results for input(s): "LIPASE", "AMYLASE" in the last 168 hours. No results for input(s): "AMMONIA" in the last 168 hours. Diabetic: No results for input(s): "HGBA1C" in the last 72  hours. No results for input(s): "GLUCAP" in the last 168 hours. Cardiac Enzymes: No results for input(s): "CKTOTAL", "CKMB", "CKMBINDEX", "TROPONINI" in the last 168 hours. No results for input(s): "PROBNP" in the last 8760 hours. Coagulation Profile: Recent Labs  Lab 11/20/22 1626 11/22/22 2149  INR 1.4* 1.3*   Thyroid Function Tests: No results for input(s): "TSH", "T4TOTAL", "FREET4", "T3FREE", "THYROIDAB" in the last 72 hours. Lipid Profile: No results for input(s): "CHOL", "HDL", "LDLCALC", "TRIG", "CHOLHDL", "LDLDIRECT" in the last 72 hours. Anemia Panel: Recent Labs    11/25/22 0550 11/26/22 0113  VITAMINB12 348  --   FOLATE  --  12.4  FERRITIN 147  --   TIBC 162*  --   IRON 20*  --   RETICCTPCT  --  3.6*   Urine analysis:    Component Value Date/Time   COLORURINE YELLOW 11/20/2022 1606   APPEARANCEUR CLEAR 11/20/2022 1606   LABSPEC 1.019 11/20/2022 1606   PHURINE 5.0 11/20/2022 1606   GLUCOSEU NEGATIVE 11/20/2022 1606   HGBUR SMALL (A) 11/20/2022 1606   BILIRUBINUR NEGATIVE 11/20/2022 1606   BILIRUBINUR Negative 04/20/2022 1137   KETONESUR NEGATIVE 11/20/2022 1606   PROTEINUR 100 (A) 11/20/2022 1606   UROBILINOGEN >=8.0 (A) 04/20/2022 1137   UROBILINOGEN 2.0 (H) 10/17/2021 1222   NITRITE NEGATIVE 11/20/2022 1606   LEUKOCYTESUR NEGATIVE 11/20/2022 1606   Sepsis Labs: Invalid input(s): "PROCALCITONIN", "LACTICIDVEN"   SIGNED:  Almon Hercules, MD  [facility] 11/27/2022, 2:35 PM

## 2022-11-27 NOTE — Patient Instructions (Signed)
Visit Information  Thank you for taking time to visit with me today. Please don't hesitate to contact me if I can be of assistance to you.   Following are the goals we discussed today:   Goals Addressed             This Visit's Progress    RN Care Coordination Activities: further follow up needed       Care Coordination Interventions: Completed successful outbound call with sister [facility]  Evaluation of current treatment plan related to Incarcerated left inguinal hernia and patient's adherence to plan as established by provider Determined patient experienced acute vomiting with lower abdominal pain, he was taken to [facility] and admitted for evidence of an incarcerated left hernia   Discussed and reviewed with sister [name], patient was discharged to his home today with the help of their brother  Reviewed and discussed with [name] discharge recommendations including;  Pt stable for transition home today, HHPT/OT has been set up per previous CM with [facility]. CM has sent msg to liaison of discharge for start of care.  [facility] has delivered [facility] to room for home per previous CM.   Follow up with PCP and general surgery as below  Check BP, CMP and CBC at follow-up   Follow-up Information       [facility] Follow up.   Specialty: Home Health Services Why: The home health agency will contact you for the first home visit. Contact information: [address] [place] [address] [phone]      Kinsinger, De Blanch, MD. Go on 12/16/2022.   Specialty: General Surgery Why: 5/8 at 11:10am. Please arrive 15 minutes early to complete check in, and bring photo ID and insurance card. Contact information: [address] [place] [address] [phone]      [facility], [place]. Go on 12/01/2022.   Specialty: General Surgery Why: staple removal scheduled for 4/23 at 3pm. Please arrive 30 minutes early to complete check in,  and bring photo ID and insurance card. Contact information: [address] [place] [address] [phone]      [name], FNP. Schedule an appointment as soon as possible for a visit in 1 week(s).   Specialty: General Practice Contact information: [address] [place] [address] [phone]      [facility]. Schedule an appointment as soon as possible for a visit in 4 week(s).   Why: 18mm bladder lesion Contact information: [address] [place] [address] [phone]    Determined sister Sebastian Ache has a good understanding of patient's discharge instructions Instructed sister [name] to notify patient's doctor promptly of new symptoms or concerns        Our next appointment is by telephone on 12/25/22 at 09:00 AM  Please call the care guide team at [phone] if you need to cancel or reschedule your appointment.   If you are experiencing a Mental Health or Behavioral Health Crisis or need someone to talk to, please call 1-800-273-TALK (toll free, 24 hour hotline) go to [facility] [place] [facility] [address], [place] [phone])  The patient verbalized understanding of instructions, educational materials, and care plan provided today and DECLINED offer to receive copy of patient instructions, educational materials, and care plan.   Delsa Sale, RN, BSN, CCM Care Management Coordinator [facility] Care Management Direct Phone: [phone]

## 2022-11-27 NOTE — Progress Notes (Signed)
Progress Note  7 Days Post-Op  Subjective: Having a BM right now.  No further bleeding in at least 48 hrs.  No other issues.  Eating well.  Objective: Vital signs in last 24 hours: Temp:  [97.3 F (36.3 C)-98.1 F (36.7 C)] 97.4 F (36.3 C) (04/19 0439) Pulse Rate:  [66-114] 67 (04/19 0848) Resp:  [13-20] 13 (04/19 0848) BP: (143-169)/(70-101) 158/77 (04/19 0439) SpO2:  [94 %-100 %] 100 % (04/19 0848) Weight:  [68.8 kg] 68.8 kg (04/19 0443) Last BM Date : 11/26/22  Intake/Output from previous day: 04/18 0701 - 04/19 0700 In: 1195 [P.O.:480; I.V.:400; Blood:315] Out: 900 [Urine:900] Intake/Output this shift: No intake/output data recorded.  PE: Abd: soft, ND, Incision left groin with staples c/d/i   Lab Results:  Recent Labs    11/26/22 0113 11/27/22 0121  WBC 3.2* 4.6  HGB 7.3* 9.6*  HCT 24.3* 28.8*  PLT 66* 67*   BMET Recent Labs    11/26/22 0113 11/27/22 0121  NA 137 137  K 4.0 4.1  CL 116* 116*  CO2 15* 17*  GLUCOSE 102* 99  BUN 18 14  CREATININE 1.39* 1.39*  CALCIUM 6.9* 7.0*   PT/INR No results for input(s): "LABPROT", "INR" in the last 72 hours.  CMP     Component Value Date/Time   NA 137 11/27/2022 0121   NA 138 06/18/2022 1545   K 4.1 11/27/2022 0121   CL 116 (H) 11/27/2022 0121   CO2 17 (L) 11/27/2022 0121   GLUCOSE 99 11/27/2022 0121   BUN 14 11/27/2022 0121   BUN 13 06/18/2022 1545   CREATININE 1.39 (H) 11/27/2022 0121   CREATININE 3.07 (HH) 06/05/2022 1505   CALCIUM 7.0 (L) 11/27/2022 0121   PROT 5.3 (L) 11/27/2022 0121   PROT 6.5 04/20/2022 1106   ALBUMIN 2.2 (L) 11/27/2022 0121   ALBUMIN 3.0 (L) 04/20/2022 1106   AST 25 11/27/2022 0121   AST 28 06/05/2022 1505   ALT 20 11/27/2022 0121   ALT 21 06/05/2022 1505   ALKPHOS 71 11/27/2022 0121   BILITOT 1.1 11/27/2022 0121   BILITOT 1.4 (H) 06/05/2022 1505   GFRNONAA 50 (L) 11/27/2022 0121   GFRNONAA 19 (L) 06/05/2022 1505   GFRAA 40 (L) 08/08/2020 1430   Lipase      Component Value Date/Time   LIPASE 40 11/20/2022 1419       Studies/Results: No results found.  Anti-infectives: Anti-infectives (From admission, onward)    Start     Dose/Rate Route Frequency Ordered Stop   11/21/22 0600  piperacillin-tazobactam (ZOSYN) IVPB 3.375 g  Status:  Discontinued        3.375 g 12.5 mL/hr over 240 Minutes Intravenous Every 8 hours 11/20/22 1818 11/21/22 1048   11/20/22 1830  piperacillin-tazobactam (ZOSYN) IVPB 3.375 g        3.375 g 100 mL/hr over 30 Minutes Intravenous  Once 11/20/22 1817 11/20/22 1952        Assessment/Plan Left obstructing and gangrenous inguinal hernia  POD 7, s/p open Left obstructing and gangrenous inguinal hernia repair, small bowel resection with anastomosis 11/20/2022 Dr. Sheliah Hatch - hgb improved after tx yesterday -eating well -bleeding has resolved -plan for DC home today per medicine.  All follow up, etc arranged from surgery standpoint.  FEN: dys3/mechanical soft (edentulous and does not have dentures here) ID: zosyn periop VTE: hold in setting of acute bleeding  Per primary Chronic diastolic CHF/severe MR History of cryptogenic stroke Hepatic cirrhosis with ascites AKI on  CKD stage IIIb Essential hypertension Chronic thrombocytopenia Bladder lesion: Pancreatic head lesion A fib    LOS: 7 days   Letha Cape, [facility] 11/27/2022, 9:52 AM Please see Amion for pager number during day hours 7:00am-4:30pm

## 2022-11-27 NOTE — Plan of Care (Signed)
Discharge instructions discussed with patient and son at bedside.   Patient instructed on home medications, restrictions, and follow up appointments. Belongings gathered and sent with patient.  Patients medications sent to [facility].   Patient discharged via wheelchair by this Clinical research associate.

## 2022-11-30 ENCOUNTER — Telehealth: Payer: Self-pay

## 2022-11-30 ENCOUNTER — Inpatient Hospital Stay: Payer: 59

## 2022-11-30 ENCOUNTER — Inpatient Hospital Stay: Payer: 59 | Admitting: Hematology and Oncology

## 2022-11-30 NOTE — Progress Notes (Signed)
Carelink Summary Report / Loop Recorder 

## 2022-11-30 NOTE — Transitions of Care (Post Inpatient/ED Visit) (Signed)
   11/30/2022  Name: Trevor Murphy MRN: [id number] DOB: March 02, 1935  Today's TOC FU Call Status: Today's TOC FU Call Status:: Successful TOC FU Call Competed TOC FU Call Complete Date: 11/30/22  Transition Care Management Follow-up Telephone Call Date of Discharge: 11/27/22 Discharge Facility: [facility]) Type of Discharge: Inpatient Admission Primary Inpatient Discharge Diagnosis:: "incarcerated hernia" How have you been since you were released from the hospital?: Better (Call completed with sister-[name]. She states pt is 'doing real good." Very minimal pain-only taken Tylenol once. Appetite good. LBM yest.) Any questions or concerns?: No  Items Reviewed: Did you receive and understand the discharge instructions provided?: Yes Medications obtained and verified?: Yes (Medications Reviewed) Any new allergies since your discharge?: No Dietary orders reviewed?: Yes Type of Diet Ordered:: low salt/heart healthy Do you have support at home?: Yes People in Home: sibling(s) Name of Support/Comfort Primary Source: [name]-sister  Home Care and Equipment/Supplies: Were Home Health Services Ordered?: Yes Name of Home Health Agency:: [facility] Has Agency set up a time to come to your home?: Yes First Home Health Visit Date: 12/02/22 Any new equipment or medical supplies ordered?: Yes Name of Medical supply agency?: [facility] Were you able to get the equipment/medical supplies?: Yes Do you have any questions related to the use of the equipment/supplies?: No  Functional Questionnaire: Do you need assistance with bathing/showering or dressing?: No Do you need assistance with meal preparation?: Yes Do you need assistance with eating?: No Do you have difficulty maintaining continence: No Do you need assistance with getting out of bed/getting out of a chair/moving?: No Do you have difficulty managing or taking your medications?: Yes  Follow up appointments reviewed: PCP Follow-up  appointment confirmed?: Yes Date of PCP follow-up appointment?: 12/08/22 Follow-up Provider: [facility] Follow-up appointment confirmed?: Yes Date of Specialist follow-up appointment?: 12/01/22 (pt's siter will call urology offie today to make follow up appt) Follow-Up Specialty Provider:: [facility]-staples removal, Dr. Kinsger-12/16/22 Do you need transportation to your follow-up appointment?: No (sister transports pt to all appts) Do you understand care options if your condition(s) worsen?: Yes-patient verbalized understanding  SDOH Interventions Today    Flowsheet Row Most Recent Value  SDOH Interventions   Food Insecurity Interventions Intervention Not Indicated  Transportation Interventions Intervention Not Indicated       TOC Interventions Today    Flowsheet Row Most Recent Value  TOC Interventions   TOC Interventions Discussed/Reviewed TOC Interventions Discussed      Interventions Today    Flowsheet Row Most Recent Value  General Interventions   General Interventions Discussed/Reviewed General Interventions Discussed, Doctor Visits  Doctor Visits Discussed/Reviewed Doctor Visits Discussed, PCP, Specialist  Education Interventions   Education Provided Provided Education  Provided Verbal Education On Medication, Nutrition, When to see the doctor  Nutrition Interventions   Nutrition Discussed/Reviewed Nutrition Discussed, Adding fruits and vegetables, Decreasing salt  Pharmacy Interventions   Pharmacy Dicussed/Reviewed Pharmacy Topics Discussed, Medications and their functions  Safety Interventions   Safety Discussed/Reviewed Safety Discussed, Home Safety  Home Safety Assistive Devices        [place], [facility] Care Management Care Management Community Coordinator Direct Phone: [phone] Toll Free: [phone] Fax: [phone]

## 2022-12-02 DIAGNOSIS — M1990 Unspecified osteoarthritis, unspecified site: Secondary | ICD-10-CM | POA: Diagnosis not present

## 2022-12-02 DIAGNOSIS — I5032 Chronic diastolic (congestive) heart failure: Secondary | ICD-10-CM | POA: Diagnosis not present

## 2022-12-02 DIAGNOSIS — K7460 Unspecified cirrhosis of liver: Secondary | ICD-10-CM | POA: Diagnosis not present

## 2022-12-02 DIAGNOSIS — I2510 Atherosclerotic heart disease of native coronary artery without angina pectoris: Secondary | ICD-10-CM | POA: Diagnosis not present

## 2022-12-02 DIAGNOSIS — E789 Disorder of lipoprotein metabolism, unspecified: Secondary | ICD-10-CM | POA: Diagnosis not present

## 2022-12-02 DIAGNOSIS — I130 Hypertensive heart and chronic kidney disease with heart failure and stage 1 through stage 4 chronic kidney disease, or unspecified chronic kidney disease: Secondary | ICD-10-CM | POA: Diagnosis not present

## 2022-12-02 DIAGNOSIS — I739 Peripheral vascular disease, unspecified: Secondary | ICD-10-CM | POA: Diagnosis not present

## 2022-12-02 DIAGNOSIS — I340 Nonrheumatic mitral (valve) insufficiency: Secondary | ICD-10-CM | POA: Diagnosis not present

## 2022-12-02 DIAGNOSIS — H409 Unspecified glaucoma: Secondary | ICD-10-CM | POA: Diagnosis not present

## 2022-12-02 DIAGNOSIS — M109 Gout, unspecified: Secondary | ICD-10-CM | POA: Diagnosis not present

## 2022-12-02 DIAGNOSIS — Z48815 Encounter for surgical aftercare following surgery on the digestive system: Secondary | ICD-10-CM | POA: Diagnosis not present

## 2022-12-02 DIAGNOSIS — I480 Paroxysmal atrial fibrillation: Secondary | ICD-10-CM | POA: Diagnosis not present

## 2022-12-02 DIAGNOSIS — Z8673 Personal history of transient ischemic attack (TIA), and cerebral infarction without residual deficits: Secondary | ICD-10-CM | POA: Diagnosis not present

## 2022-12-02 DIAGNOSIS — K219 Gastro-esophageal reflux disease without esophagitis: Secondary | ICD-10-CM | POA: Diagnosis not present

## 2022-12-02 DIAGNOSIS — N1832 Chronic kidney disease, stage 3b: Secondary | ICD-10-CM | POA: Diagnosis not present

## 2022-12-02 DIAGNOSIS — D631 Anemia in chronic kidney disease: Secondary | ICD-10-CM | POA: Diagnosis not present

## 2022-12-08 ENCOUNTER — Encounter: Payer: Self-pay | Admitting: Nurse Practitioner

## 2022-12-08 ENCOUNTER — Telehealth: Payer: Self-pay

## 2022-12-08 ENCOUNTER — Ambulatory Visit (INDEPENDENT_AMBULATORY_CARE_PROVIDER_SITE_OTHER): Payer: 59 | Admitting: Nurse Practitioner

## 2022-12-08 ENCOUNTER — Ambulatory Visit: Payer: Medicare Other | Admitting: Hematology and Oncology

## 2022-12-08 ENCOUNTER — Other Ambulatory Visit: Payer: Medicare Other

## 2022-12-08 VITALS — BP 112/64 | HR 64 | Temp 98.2°F | Ht 70.5 in | Wt 137.0 lb

## 2022-12-08 DIAGNOSIS — I130 Hypertensive heart and chronic kidney disease with heart failure and stage 1 through stage 4 chronic kidney disease, or unspecified chronic kidney disease: Secondary | ICD-10-CM | POA: Diagnosis not present

## 2022-12-08 DIAGNOSIS — Z2821 Immunization not carried out because of patient refusal: Secondary | ICD-10-CM

## 2022-12-08 DIAGNOSIS — K4030 Unilateral inguinal hernia, with obstruction, without gangrene, not specified as recurrent: Secondary | ICD-10-CM | POA: Diagnosis not present

## 2022-12-08 DIAGNOSIS — I5032 Chronic diastolic (congestive) heart failure: Secondary | ICD-10-CM | POA: Diagnosis not present

## 2022-12-08 DIAGNOSIS — I110 Hypertensive heart disease with heart failure: Secondary | ICD-10-CM

## 2022-12-08 DIAGNOSIS — N183 Chronic kidney disease, stage 3 unspecified: Secondary | ICD-10-CM

## 2022-12-08 DIAGNOSIS — D649 Anemia, unspecified: Secondary | ICD-10-CM | POA: Diagnosis not present

## 2022-12-08 DIAGNOSIS — N1831 Chronic kidney disease, stage 3a: Secondary | ICD-10-CM

## 2022-12-08 NOTE — Progress Notes (Signed)
Jeri Cos [place]leton,acting as a Neurosurgeon for [name], FNP.,have documented all relevant documentation on the behalf of [name], FNP,as directed by  [name], FNP while in the presence of [name], FNP.    Subjective:     Patient ID: Trevor Murphy , male    DOB: May 02, 1935 , 88 y.o.   MRN: [id number]   Chief Complaint  Patient presents with  . Hospital f/u    HPI  Patient presents today for a hospital f/u. He was admitted into [facility] on 11/20/22 and discharged on 11/27/22.Patient is doing better since being out of the hospital. They do not have any questions or concerns at this time.  She has been to the Surgeon to have his sutures removed he has a f/u appt on 5/8 with the surgeon  He is to f/u wth Dr. Elnoria Howard in 3 months Wt Readings from Last 3 Encounters: 12/08/22 : 137 lb (62.1 kg) 11/27/22 : 151 lb 10.8 oz (68.8 kg) 10/21/22 : 126 lb (57.2 kg)      Past Medical History:  Diagnosis Date  . Arthritis   . CAD (coronary artery disease)    a. Cath 08/2009: minimal nonobstructive disease (25% LAD). b. Lexiscan nuc 6/215: EF 54%, no ischemia or infarction.  . Chronic diastolic CHF (congestive heart failure) (HCC)    a. Echo 2013: EF 55-60%, mild AI, mild MR. b. Echo 5/15 with EF 60-65%, no WMA, mild LVH, mild AI.  [name] Kitchen CKD (chronic kidney disease) stage 3, GFR 30-59 ml/min (HCC) 07/26/2012   a. Felt likely due to HTN.  . CVA (cerebral infarction) 07/28/2012  . Glaucoma   . Gout   . H/O umbilical hernia repair   . Headache(784.0)    " WHEN MY BLOOD PRESSURE IS UP"  . History of acute renal failure   . History of alcohol abuse   . HTN (hypertension)   . Hypertension    a. Patent renal arteries 2005.  [name] Kitchen Noncompliance   . OA (osteoarthritis)   . Other and unspecified hyperlipidemia   . Poor circulation   . Sinus bradycardia   . SVT (supraventricular tachycardia)    a. Possible AVNRT.  Got adenosine from EMS in 2015 for SVT.  During 5/15 hospitalization had  episode of short R-P tachycardia (possible AVNRT) terminated by adenosine.  b. 04/2016 - recurrent tachycardia (SVT vs. AVNRT), again terminated by Adenosine  . Visual impairment      Family History  Problem Relation Age of Onset  . CVA Mother   . Hypertension Brother   . Hypertension Sister   . Hypertension Brother   . Stroke Sister   . Heart attack Neg Hx      Current Outpatient Medications:  .  acetaminophen (TYLENOL) 500 MG tablet, Take 1,000 mg by mouth 2 (two) times daily as needed for mild pain or moderate pain (leg pain)., Disp: , Rfl:  .  carvedilol (COREG) 6.25 MG tablet, Take 1 tablet (6.25 mg total) by mouth 2 (two) times daily with a meal., Disp: 180 tablet, Rfl: 1 .  dapagliflozin propanediol ([name]) 10 MG TABS tablet, Take 1 tablet (10 mg total) by mouth daily before breakfast., Disp: 30 tablet, Rfl: 2 .  ferrous sulfate (FEROSUL) 325 (65 FE) MG tablet, TAKE 1 TABLET(325 MG) BY MOUTH DAILY, Disp: 90 tablet, Rfl: 1 .  finasteride (PROSCAR) 5 MG tablet, Take 5 mg by mouth daily., Disp: , Rfl:  .  furosemide (LASIX) 20 MG tablet, Take 1  tablet (20 mg total) by mouth daily., Disp: 90 tablet, Rfl: 1 .  lactulose (CHRONULAC) 10 GM/15ML solution, Take 15 mLs (10 g total) by mouth 3 (three) times daily as needed for mild constipation., Disp: 473 mL, Rfl: 0 .  LUMIGAN 0.01 % SOLN, Place 1 drop into both eyes at bedtime., Disp: , Rfl:  .  SIMBRINZA 1-0.2 % SUSP, Follow up with your PCP on when to restart this medication. (Patient taking differently: Place 1 drop into both eyes 3 (three) times daily.), Disp: , Rfl:  .  spironolactone (ALDACTONE) 50 MG tablet, Take 1 tablet (50 mg total) by mouth daily., Disp: 30 tablet, Rfl: 3 .  traMADol (ULTRAM) 50 MG tablet, Take 1 tablet (50 mg total) by mouth every 6 (six) hours as needed for moderate pain., Disp: 15 tablet, Rfl: 0   No Known Allergies   Review of Systems  Constitutional: Negative.   Eyes: Negative.   Respiratory:  Negative.    Cardiovascular: Negative.   Musculoskeletal: Negative.   Skin: Negative.   Psychiatric/Behavioral: Negative.       Today's Vitals   12/08/22 1529  BP: 112/64  Pulse: 64  Temp: 98.2 F (36.8 C)  Weight: 137 lb (62.1 kg)  Height: 5' 10.5" (1.791 m)  PainSc: 0-No pain   Body mass index is 19.38 kg/m.   Objective:  Physical Exam Vitals reviewed.  Constitutional:      General: He is not in acute distress.    Appearance: Normal appearance.  HENT:     Head: Normocephalic.  Cardiovascular:     Rate and Rhythm: Normal rate and regular rhythm.     Pulses: Normal pulses.     Heart sounds: Normal heart sounds. No murmur heard. Pulmonary:     Effort: Pulmonary effort is normal. No respiratory distress.     Breath sounds: Normal breath sounds. No wheezing.  Musculoskeletal:        General: Normal range of motion.     Comments: Sitting in wheelchair  Skin:    General: Skin is warm and dry.     Capillary Refill: Capillary refill takes less than 2 seconds.  Neurological:     General: No focal deficit present.     Mental Status: He is alert and oriented to person, place, and time.     Cranial Nerves: No cranial nerve deficit.     Motor: No weakness.  Psychiatric:        Mood and Affect: Mood normal.        Behavior: Behavior normal.        Thought Content: Thought content normal.        Judgment: Judgment normal.        Assessment And Plan:     1. Incarcerated inguinal hernia, unilateral Comments: He had surgical intervention and is to f/u with surgeon. TCM Performed. A member of the clinical team spoke with the patient upon dischare. Discharge summary was reviewed in full detail during the visit. Meds reconciled and compared to discharge meds. Medication list is updated and reviewed with the patient.  Greater than 50% face to face time was spent in counseling an coordination of care.  All questions were answered to the satisfaction of the patient.   -  BMP8+eGFR  2. Low hemoglobin Comments: Will recheck Hgb was low down to 5.6 given 2 units PRBC. - CBC with Differential/Platelet  3. Hypertensive heart disease with chronic diastolic congestive heart failure (HCC) Comments: Blood pressure is well controlled,  continue current medications and f/u with Cardiology  4. Stage 3a chronic kidney disease (HCC) Comments: Continue to avoid NSAIDs and stay well hydrated with water. - CBC with Differential/Platelet  5. Pneumococcal vaccination declined  6. Herpes zoster vaccination declined     Patient was given opportunity to ask questions. Patient verbalized understanding of the plan and was able to repeat key elements of the plan. All questions were answered to their satisfaction.  [name], FNP   I, [name], FNP, have reviewed all documentation for this visit. The documentation on /0/24 for the exam, diagnosis, procedures, and orders are all accurate and complete.   IF YOU HAVE BEEN REFERRED TO A SPECIALIST, IT MAY TAKE 1-2 WEEKS TO SCHEDULE/PROCESS THE REFERRAL. IF YOU HAVE NOT HEARD FROM US/SPECIALIST IN TWO WEEKS, PLEASE GIVE [place] A CALL AT [phone] X [phone].   THE PATIENT IS ENCOURAGED TO PRACTICE SOCIAL DISTANCING DUE TO THE COVID-19 PANDEMIC.

## 2022-12-08 NOTE — Telephone Encounter (Signed)
Patient's order for a transport chair has been placed through [employer]. YL,RMA

## 2022-12-09 DIAGNOSIS — Z8673 Personal history of transient ischemic attack (TIA), and cerebral infarction without residual deficits: Secondary | ICD-10-CM | POA: Diagnosis not present

## 2022-12-09 DIAGNOSIS — K219 Gastro-esophageal reflux disease without esophagitis: Secondary | ICD-10-CM | POA: Diagnosis not present

## 2022-12-09 DIAGNOSIS — K7460 Unspecified cirrhosis of liver: Secondary | ICD-10-CM | POA: Diagnosis not present

## 2022-12-09 DIAGNOSIS — I2510 Atherosclerotic heart disease of native coronary artery without angina pectoris: Secondary | ICD-10-CM | POA: Diagnosis not present

## 2022-12-09 DIAGNOSIS — I130 Hypertensive heart and chronic kidney disease with heart failure and stage 1 through stage 4 chronic kidney disease, or unspecified chronic kidney disease: Secondary | ICD-10-CM | POA: Diagnosis not present

## 2022-12-09 DIAGNOSIS — I5032 Chronic diastolic (congestive) heart failure: Secondary | ICD-10-CM | POA: Diagnosis not present

## 2022-12-09 DIAGNOSIS — I739 Peripheral vascular disease, unspecified: Secondary | ICD-10-CM | POA: Diagnosis not present

## 2022-12-09 DIAGNOSIS — M1990 Unspecified osteoarthritis, unspecified site: Secondary | ICD-10-CM | POA: Diagnosis not present

## 2022-12-09 DIAGNOSIS — H409 Unspecified glaucoma: Secondary | ICD-10-CM | POA: Diagnosis not present

## 2022-12-09 DIAGNOSIS — I480 Paroxysmal atrial fibrillation: Secondary | ICD-10-CM | POA: Diagnosis not present

## 2022-12-09 DIAGNOSIS — N1832 Chronic kidney disease, stage 3b: Secondary | ICD-10-CM | POA: Diagnosis not present

## 2022-12-09 DIAGNOSIS — D631 Anemia in chronic kidney disease: Secondary | ICD-10-CM | POA: Diagnosis not present

## 2022-12-09 DIAGNOSIS — E789 Disorder of lipoprotein metabolism, unspecified: Secondary | ICD-10-CM | POA: Diagnosis not present

## 2022-12-09 DIAGNOSIS — M109 Gout, unspecified: Secondary | ICD-10-CM | POA: Diagnosis not present

## 2022-12-09 DIAGNOSIS — Z48815 Encounter for surgical aftercare following surgery on the digestive system: Secondary | ICD-10-CM | POA: Diagnosis not present

## 2022-12-09 DIAGNOSIS — I340 Nonrheumatic mitral (valve) insufficiency: Secondary | ICD-10-CM | POA: Diagnosis not present

## 2022-12-10 DIAGNOSIS — D631 Anemia in chronic kidney disease: Secondary | ICD-10-CM | POA: Diagnosis not present

## 2022-12-10 DIAGNOSIS — I340 Nonrheumatic mitral (valve) insufficiency: Secondary | ICD-10-CM | POA: Diagnosis not present

## 2022-12-10 DIAGNOSIS — I480 Paroxysmal atrial fibrillation: Secondary | ICD-10-CM | POA: Diagnosis not present

## 2022-12-10 DIAGNOSIS — E789 Disorder of lipoprotein metabolism, unspecified: Secondary | ICD-10-CM | POA: Diagnosis not present

## 2022-12-10 DIAGNOSIS — H409 Unspecified glaucoma: Secondary | ICD-10-CM | POA: Diagnosis not present

## 2022-12-10 DIAGNOSIS — I2510 Atherosclerotic heart disease of native coronary artery without angina pectoris: Secondary | ICD-10-CM | POA: Diagnosis not present

## 2022-12-10 DIAGNOSIS — Z48815 Encounter for surgical aftercare following surgery on the digestive system: Secondary | ICD-10-CM | POA: Diagnosis not present

## 2022-12-10 DIAGNOSIS — M109 Gout, unspecified: Secondary | ICD-10-CM | POA: Diagnosis not present

## 2022-12-10 DIAGNOSIS — K219 Gastro-esophageal reflux disease without esophagitis: Secondary | ICD-10-CM | POA: Diagnosis not present

## 2022-12-10 DIAGNOSIS — I5032 Chronic diastolic (congestive) heart failure: Secondary | ICD-10-CM | POA: Diagnosis not present

## 2022-12-10 DIAGNOSIS — I739 Peripheral vascular disease, unspecified: Secondary | ICD-10-CM | POA: Diagnosis not present

## 2022-12-10 DIAGNOSIS — K7460 Unspecified cirrhosis of liver: Secondary | ICD-10-CM | POA: Diagnosis not present

## 2022-12-10 DIAGNOSIS — M1990 Unspecified osteoarthritis, unspecified site: Secondary | ICD-10-CM | POA: Diagnosis not present

## 2022-12-10 DIAGNOSIS — I130 Hypertensive heart and chronic kidney disease with heart failure and stage 1 through stage 4 chronic kidney disease, or unspecified chronic kidney disease: Secondary | ICD-10-CM | POA: Diagnosis not present

## 2022-12-10 DIAGNOSIS — Z8673 Personal history of transient ischemic attack (TIA), and cerebral infarction without residual deficits: Secondary | ICD-10-CM | POA: Diagnosis not present

## 2022-12-10 DIAGNOSIS — N1832 Chronic kidney disease, stage 3b: Secondary | ICD-10-CM | POA: Diagnosis not present

## 2022-12-11 DIAGNOSIS — K7460 Unspecified cirrhosis of liver: Secondary | ICD-10-CM | POA: Diagnosis not present

## 2022-12-11 DIAGNOSIS — Z48815 Encounter for surgical aftercare following surgery on the digestive system: Secondary | ICD-10-CM | POA: Diagnosis not present

## 2022-12-11 DIAGNOSIS — H409 Unspecified glaucoma: Secondary | ICD-10-CM | POA: Diagnosis not present

## 2022-12-11 DIAGNOSIS — I739 Peripheral vascular disease, unspecified: Secondary | ICD-10-CM | POA: Diagnosis not present

## 2022-12-11 DIAGNOSIS — M1990 Unspecified osteoarthritis, unspecified site: Secondary | ICD-10-CM | POA: Diagnosis not present

## 2022-12-11 DIAGNOSIS — I130 Hypertensive heart and chronic kidney disease with heart failure and stage 1 through stage 4 chronic kidney disease, or unspecified chronic kidney disease: Secondary | ICD-10-CM | POA: Diagnosis not present

## 2022-12-11 DIAGNOSIS — E789 Disorder of lipoprotein metabolism, unspecified: Secondary | ICD-10-CM | POA: Diagnosis not present

## 2022-12-11 DIAGNOSIS — D631 Anemia in chronic kidney disease: Secondary | ICD-10-CM | POA: Diagnosis not present

## 2022-12-11 DIAGNOSIS — M109 Gout, unspecified: Secondary | ICD-10-CM | POA: Diagnosis not present

## 2022-12-11 DIAGNOSIS — I5032 Chronic diastolic (congestive) heart failure: Secondary | ICD-10-CM | POA: Diagnosis not present

## 2022-12-11 DIAGNOSIS — K219 Gastro-esophageal reflux disease without esophagitis: Secondary | ICD-10-CM | POA: Diagnosis not present

## 2022-12-11 DIAGNOSIS — Z8673 Personal history of transient ischemic attack (TIA), and cerebral infarction without residual deficits: Secondary | ICD-10-CM | POA: Diagnosis not present

## 2022-12-11 DIAGNOSIS — I2510 Atherosclerotic heart disease of native coronary artery without angina pectoris: Secondary | ICD-10-CM | POA: Diagnosis not present

## 2022-12-11 DIAGNOSIS — N1832 Chronic kidney disease, stage 3b: Secondary | ICD-10-CM | POA: Diagnosis not present

## 2022-12-11 DIAGNOSIS — I480 Paroxysmal atrial fibrillation: Secondary | ICD-10-CM | POA: Diagnosis not present

## 2022-12-11 DIAGNOSIS — I340 Nonrheumatic mitral (valve) insufficiency: Secondary | ICD-10-CM | POA: Diagnosis not present

## 2022-12-15 DIAGNOSIS — H5442A3 Blindness left eye category 3, normal vision right eye: Secondary | ICD-10-CM | POA: Diagnosis not present

## 2022-12-15 DIAGNOSIS — H35033 Hypertensive retinopathy, bilateral: Secondary | ICD-10-CM | POA: Diagnosis not present

## 2022-12-15 DIAGNOSIS — H4323 Crystalline deposits in vitreous body, bilateral: Secondary | ICD-10-CM | POA: Diagnosis not present

## 2022-12-15 DIAGNOSIS — H348122 Central retinal vein occlusion, left eye, stable: Secondary | ICD-10-CM | POA: Diagnosis not present

## 2022-12-15 DIAGNOSIS — H401133 Primary open-angle glaucoma, bilateral, severe stage: Secondary | ICD-10-CM | POA: Diagnosis not present

## 2022-12-17 ENCOUNTER — Ambulatory Visit: Payer: Self-pay

## 2022-12-17 NOTE — Patient Outreach (Signed)
  Care Coordination   Follow Up Visit Note   12/17/2022 Name: Trevor Murphy MRN: [id number] DOB: 04-01-1935  Trevor Murphy is a 88 y.o. year old male who sees [name], FNP for primary care. I spoke with  [name] by phone today.  What matters to the patients health and wellness today?  Patient is in need of a transport chair and rollator.     Goals Addressed             This Visit's Progress    RN Care Coordination Activities: further follow up needed       Care Coordination Interventions: Completed successful outbound call with sister [name]  Evaluation of current treatment plan related to Incarcerated left inguinal hernia and patient's adherence to plan as established by provider Received inbound call from sister [name] patient needs a transport chair, reviewed chart to note the PCP ordered this DME and submitted the order to [employer] [facility] via [employer] on 12/08/22 Determined sister [name] contacted [employer] and was advised the order was not received Discussed with sister [name] patient will also benefit from having a rollator, sister [name] confirmed this DME is a covered benefit  Sent secure message to PCP [name] and CMA [name] requesting the order be resent to [employer] for the transport chair, requested an additional order for a rollator      Interventions Today    Flowsheet Row Most Recent Value  Chronic Disease   Chronic disease during today's visit Other, Chronic Kidney Disease/End Stage Renal Disease (ESRD)  [s/p hernia repair]  General Interventions   General Interventions Discussed/Reviewed General Interventions Discussed, General Interventions Reviewed, Communication with, Doctor Visits  Doctor Visits Discussed/Reviewed Doctor Visits Discussed, Doctor Visits Reviewed, PCP, Specialist  Durable Medical Equipment (DME) [name], Other  [patient needs transport chair and rolator]  Communication with PCP/Specialists  [[name]]  Education Interventions   Education Provided Provided Education  Provided Verbal Education On Labs  Labs Reviewed Kidney Function  Safety Interventions   Safety Discussed/Reviewed Safety Reviewed, Safety Discussed  Home Safety Assistive Devices          SDOH assessments and interventions completed:  No     Care Coordination Interventions:  Yes, provided   Follow up plan: Follow up call scheduled for 12/25/22 @09 :00 AM    Encounter Outcome:  Pt. Visit Completed

## 2022-12-17 NOTE — Patient Instructions (Signed)
Visit Information  Thank you for taking time to visit with me today. Please don't hesitate to contact me if I can be of assistance to you.   Following are the goals we discussed today:   Goals Addressed             This Visit's Progress    RN Care Coordination Activities: further follow up needed       Care Coordination Interventions: Completed successful outbound call with sister [facility]  Evaluation of current treatment plan related to Incarcerated left inguinal hernia and patient's adherence to plan as established by provider Received inbound call from sister [name] patient needs a transport chair, reviewed chart to note the PCP ordered this DME and submitted the order to [employer] via [employer] on 12/08/22 Determined sister [name] contacted [employer] and was advised the order was not received Discussed with sister [name] patient will also benefit from having a rollator, sister [name] confirmed this DME is a covered benefit  Sent secure message to PCP [name] and CMA [name] requesting the order be resent to [employer] for the transport chair, requested an additional order for a rollator          Our next appointment is by telephone on 12/25/22 at 09:00 AM  Please call the care guide team at [phone] if you need to cancel or reschedule your appointment.   If you are experiencing a Mental Health or Behavioral Health Crisis or need someone to talk to, please call 1-800-273-TALK (toll free, 24 hour hotline) go to [facility] [address], [place] [phone])  The patient verbalized understanding of instructions, educational materials, and care plan provided today and DECLINED offer to receive copy of patient instructions, educational materials, and care plan.   Delsa Sale, RN, BSN, CCM Care Management Coordinator [facility] Care Management  Direct Phone: [phone]

## 2022-12-21 DIAGNOSIS — R310 Gross hematuria: Secondary | ICD-10-CM | POA: Diagnosis not present

## 2022-12-23 DIAGNOSIS — I2510 Atherosclerotic heart disease of native coronary artery without angina pectoris: Secondary | ICD-10-CM | POA: Diagnosis not present

## 2022-12-23 DIAGNOSIS — H409 Unspecified glaucoma: Secondary | ICD-10-CM | POA: Diagnosis not present

## 2022-12-23 DIAGNOSIS — K7460 Unspecified cirrhosis of liver: Secondary | ICD-10-CM | POA: Diagnosis not present

## 2022-12-23 DIAGNOSIS — N1832 Chronic kidney disease, stage 3b: Secondary | ICD-10-CM | POA: Diagnosis not present

## 2022-12-23 DIAGNOSIS — I130 Hypertensive heart and chronic kidney disease with heart failure and stage 1 through stage 4 chronic kidney disease, or unspecified chronic kidney disease: Secondary | ICD-10-CM | POA: Diagnosis not present

## 2022-12-23 DIAGNOSIS — I739 Peripheral vascular disease, unspecified: Secondary | ICD-10-CM | POA: Diagnosis not present

## 2022-12-23 DIAGNOSIS — Z8673 Personal history of transient ischemic attack (TIA), and cerebral infarction without residual deficits: Secondary | ICD-10-CM | POA: Diagnosis not present

## 2022-12-23 DIAGNOSIS — M1990 Unspecified osteoarthritis, unspecified site: Secondary | ICD-10-CM | POA: Diagnosis not present

## 2022-12-23 DIAGNOSIS — D631 Anemia in chronic kidney disease: Secondary | ICD-10-CM | POA: Diagnosis not present

## 2022-12-23 DIAGNOSIS — M109 Gout, unspecified: Secondary | ICD-10-CM | POA: Diagnosis not present

## 2022-12-23 DIAGNOSIS — E789 Disorder of lipoprotein metabolism, unspecified: Secondary | ICD-10-CM | POA: Diagnosis not present

## 2022-12-23 DIAGNOSIS — I340 Nonrheumatic mitral (valve) insufficiency: Secondary | ICD-10-CM | POA: Diagnosis not present

## 2022-12-23 DIAGNOSIS — I480 Paroxysmal atrial fibrillation: Secondary | ICD-10-CM | POA: Diagnosis not present

## 2022-12-23 DIAGNOSIS — K219 Gastro-esophageal reflux disease without esophagitis: Secondary | ICD-10-CM | POA: Diagnosis not present

## 2022-12-23 DIAGNOSIS — I5032 Chronic diastolic (congestive) heart failure: Secondary | ICD-10-CM | POA: Diagnosis not present

## 2022-12-23 DIAGNOSIS — Z48815 Encounter for surgical aftercare following surgery on the digestive system: Secondary | ICD-10-CM | POA: Diagnosis not present

## 2022-12-24 ENCOUNTER — Encounter: Payer: Self-pay | Admitting: Podiatry

## 2022-12-24 ENCOUNTER — Ambulatory Visit (INDEPENDENT_AMBULATORY_CARE_PROVIDER_SITE_OTHER): Payer: 59 | Admitting: Podiatry

## 2022-12-24 DIAGNOSIS — M79674 Pain in right toe(s): Secondary | ICD-10-CM | POA: Diagnosis not present

## 2022-12-24 DIAGNOSIS — M79675 Pain in left toe(s): Secondary | ICD-10-CM

## 2022-12-24 DIAGNOSIS — B351 Tinea unguium: Secondary | ICD-10-CM | POA: Diagnosis not present

## 2022-12-24 DIAGNOSIS — I739 Peripheral vascular disease, unspecified: Secondary | ICD-10-CM | POA: Diagnosis not present

## 2022-12-24 NOTE — Progress Notes (Signed)
This patient returns to my office for at risk foot care.  This patient requires this care by a professional since this patient will be at risk due to having chronic kidney disease and coagulation defect.  Patient is taking eliquis.   This patient is unable to cut nails himself since the patient cannot reach his nails.These nails are painful walking and wearing shoes.  He presents to the office with his sister.  This patient presents for at risk foot care today.  General Appearance  Alert, conversant and in no acute stress.  Vascular  Dorsalis pedis and posterior tibial  pulses are weakly  palpable  bilaterally.  Capillary return is within normal limits  Bilaterally. Cold feet bilaterally. Absent digital hair  B/L.  Neurologic  Senn-Weinstein monofilament wire test within normal limits  bilaterally. Muscle power within normal limits bilaterally.  Nails Thick disfigured discolored nails with subungual debris  from hallux to fifth toes bilaterally. No evidence of bacterial infection or drainage bilaterally.  Orthopedic  No limitations of motion  feet .  No crepitus or effusions noted.  No bony pathology or digital deformities noted.  HAV  B/L.  Skin  normotropic skin with no porokeratosis noted bilaterally.  No signs of infections or ulcers noted.     Onychomycosis  Pain in right toes  Pain in left toes  Consent was obtained for treatment procedures.   Mechanical debridement of nails 1-5  bilaterally performed with a nail nipper.  Filed with dremel without incident.  Cauterized fourth toe left foot.   Return office visit     10  weeks                Told patient to return for periodic foot care and evaluation due to potential at risk complications.   Neftaly Swiss DPM  

## 2022-12-28 ENCOUNTER — Ambulatory Visit (INDEPENDENT_AMBULATORY_CARE_PROVIDER_SITE_OTHER): Payer: 59

## 2022-12-28 DIAGNOSIS — I639 Cerebral infarction, unspecified: Secondary | ICD-10-CM

## 2022-12-28 LAB — CUP PACEART REMOTE DEVICE CHECK
Date Time Interrogation Session: 20240517230206
Implantable Pulse Generator Implant Date: 20201014

## 2022-12-30 NOTE — Progress Notes (Signed)
Carelink Summary Report / Loop Recorder 

## 2023-01-01 DIAGNOSIS — M109 Gout, unspecified: Secondary | ICD-10-CM | POA: Diagnosis not present

## 2023-01-01 DIAGNOSIS — E789 Disorder of lipoprotein metabolism, unspecified: Secondary | ICD-10-CM | POA: Diagnosis not present

## 2023-01-01 DIAGNOSIS — Z8673 Personal history of transient ischemic attack (TIA), and cerebral infarction without residual deficits: Secondary | ICD-10-CM | POA: Diagnosis not present

## 2023-01-01 DIAGNOSIS — K219 Gastro-esophageal reflux disease without esophagitis: Secondary | ICD-10-CM | POA: Diagnosis not present

## 2023-01-01 DIAGNOSIS — Z48815 Encounter for surgical aftercare following surgery on the digestive system: Secondary | ICD-10-CM | POA: Diagnosis not present

## 2023-01-01 DIAGNOSIS — I480 Paroxysmal atrial fibrillation: Secondary | ICD-10-CM | POA: Diagnosis not present

## 2023-01-01 DIAGNOSIS — I5032 Chronic diastolic (congestive) heart failure: Secondary | ICD-10-CM | POA: Diagnosis not present

## 2023-01-01 DIAGNOSIS — N1832 Chronic kidney disease, stage 3b: Secondary | ICD-10-CM | POA: Diagnosis not present

## 2023-01-01 DIAGNOSIS — K7460 Unspecified cirrhosis of liver: Secondary | ICD-10-CM | POA: Diagnosis not present

## 2023-01-01 DIAGNOSIS — D631 Anemia in chronic kidney disease: Secondary | ICD-10-CM | POA: Diagnosis not present

## 2023-01-01 DIAGNOSIS — I739 Peripheral vascular disease, unspecified: Secondary | ICD-10-CM | POA: Diagnosis not present

## 2023-01-01 DIAGNOSIS — I2510 Atherosclerotic heart disease of native coronary artery without angina pectoris: Secondary | ICD-10-CM | POA: Diagnosis not present

## 2023-01-01 DIAGNOSIS — M1990 Unspecified osteoarthritis, unspecified site: Secondary | ICD-10-CM | POA: Diagnosis not present

## 2023-01-01 DIAGNOSIS — I130 Hypertensive heart and chronic kidney disease with heart failure and stage 1 through stage 4 chronic kidney disease, or unspecified chronic kidney disease: Secondary | ICD-10-CM | POA: Diagnosis not present

## 2023-01-01 DIAGNOSIS — H409 Unspecified glaucoma: Secondary | ICD-10-CM | POA: Diagnosis not present

## 2023-01-01 DIAGNOSIS — I340 Nonrheumatic mitral (valve) insufficiency: Secondary | ICD-10-CM | POA: Diagnosis not present

## 2023-01-06 ENCOUNTER — Ambulatory Visit: Payer: Self-pay

## 2023-01-07 ENCOUNTER — Other Ambulatory Visit (INDEPENDENT_AMBULATORY_CARE_PROVIDER_SITE_OTHER): Payer: 59 | Admitting: Nurse Practitioner

## 2023-01-07 DIAGNOSIS — R188 Other ascites: Secondary | ICD-10-CM | POA: Diagnosis not present

## 2023-01-07 DIAGNOSIS — K219 Gastro-esophageal reflux disease without esophagitis: Secondary | ICD-10-CM

## 2023-01-07 DIAGNOSIS — I130 Hypertensive heart and chronic kidney disease with heart failure and stage 1 through stage 4 chronic kidney disease, or unspecified chronic kidney disease: Secondary | ICD-10-CM | POA: Diagnosis not present

## 2023-01-07 DIAGNOSIS — N1831 Chronic kidney disease, stage 3a: Secondary | ICD-10-CM

## 2023-01-07 DIAGNOSIS — I739 Peripheral vascular disease, unspecified: Secondary | ICD-10-CM

## 2023-01-07 DIAGNOSIS — K7460 Unspecified cirrhosis of liver: Secondary | ICD-10-CM

## 2023-01-07 DIAGNOSIS — I5032 Chronic diastolic (congestive) heart failure: Secondary | ICD-10-CM

## 2023-01-07 DIAGNOSIS — Z48815 Encounter for surgical aftercare following surgery on the digestive system: Secondary | ICD-10-CM | POA: Diagnosis not present

## 2023-01-07 DIAGNOSIS — D631 Anemia in chronic kidney disease: Secondary | ICD-10-CM | POA: Diagnosis not present

## 2023-01-07 NOTE — Progress Notes (Signed)
Chief Complaint  Patient presents with   Home Health Initial Certification   Received home health orders orders from [facility]. Start of care 12/02/2022.   Certification and orders from 12/02/22 through 01/30/2023 are reviewed, signed and faxed back to home health company.  Need of intermittent skilled services at home: PT and OT  The home health care plan has been established by me and will be reviewed and updated as needed to maximize patient recovery.  I certify that all home health services have been and will be furnished to the patient while under my care.  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

## 2023-01-07 NOTE — Patient Instructions (Signed)
Visit Information  Thank you for taking time to visit with me today. Please don't hesitate to contact me if I can be of assistance to you.   Following are the goals we discussed today:   Goals Addressed             This Visit's Progress    To reduce number of falls       Care Coordination Interventions: Placed successful outbound call to sister Merrilyn Puma patient of importance of notifying provider of falls Assessed for falls since last encounter Assessed patients knowledge of fall risk prevention secondary to previously provided education Determined sister Roderic Scarce spoke with [employer] who advised patient's request for a transport chair and rollator walker was denied by his insurance due to having insufficient information from the PCP Placed outbound call to [employer], left my direct number and requested a call back from the [facility] department to discuss what is needed from PCP for DME             Our next appointment is by telephone on 01/14/23 at 01:30 PM  Please call the care guide team at [phone] if you need to cancel or reschedule your appointment.   If you are experiencing a Mental Health or Behavioral Health Crisis or need someone to talk to, please call 1-800-273-TALK (toll free, 24 hour hotline)  The patient verbalized understanding of instructions, educational materials, and care plan provided today and DECLINED offer to receive copy of patient instructions, educational materials, and care plan.   Delsa Sale, RN, BSN, CCM Care Management Coordinator [facility] Direct Phone: [phone]

## 2023-01-07 NOTE — Patient Outreach (Signed)
  Care Coordination   Follow Up Visit Note   01/14/2023 Name: Trevor Murphy MRN: [id number] DOB: 12-01-1934  Trevor Murphy is a 89 y.o. year old male who sees [name], FNP for primary care. I spoke with sister Lorna Few by phone today.  What matters to the patients health and wellness today?  Patient would like assistance with following up on DME needed, including a rollator and transport chair.     Goals Addressed             This Visit's Progress    To reduce number of falls       Care Coordination Interventions: Placed successful outbound call to sister Merrilyn Puma patient of importance of notifying provider of falls Assessed for falls since last encounter Assessed patients knowledge of fall risk prevention secondary to previously provided education Determined sister Roderic Scarce spoke with [employer] who advised patient's request for a transport chair and rollator walker was denied by his insurance due to having insufficient information from the PCP Placed outbound call to [employer], left my direct number and requested a call back from the [facility] department to discuss what is needed from PCP for DME    Interventions Today    Flowsheet Row Most Recent Value  General Interventions   General Interventions Discussed/Reviewed General Interventions Discussed, General Interventions Reviewed, Durable Medical Equipment (DME), Communication with  Durable Medical Equipment (DME) [name], Other  Wiliam Ke chair]  Communication with --  [[employer]]  Exercise Interventions   Exercise Discussed/Reviewed Physical Activity  Physical Activity Discussed/Reviewed Physical Activity Discussed, Physical Activity Reviewed  Education Interventions   Education Provided Provided Education  Safety Interventions   Safety Discussed/Reviewed Safety Discussed, Safety Reviewed, Fall Risk, Home Safety  Home Safety Assistive Devices          SDOH assessments and interventions  completed:  No     Care Coordination Interventions:  Yes, provided   Follow up plan: Follow up call scheduled for 01/14/23 @1 :30 PM    Encounter Outcome:  Pt. Visit Completed

## 2023-01-14 ENCOUNTER — Other Ambulatory Visit: Payer: Self-pay | Admitting: Pharmacist

## 2023-01-14 ENCOUNTER — Ambulatory Visit: Payer: Self-pay

## 2023-01-14 NOTE — Patient Outreach (Signed)
  Care Coordination   Follow Up Visit Note   01/14/2023 Name: Trevor Murphy MRN: [id number] DOB: 1934-12-29  Trevor Murphy is a 88 y.o. year old male who sees Trevor Felts, Trevor Murphy for primary care. I spoke with  Trevor Murphy by phone today.  What matters to the patients health and wellness today?  Patient needs a transport chair and rollator walker to help improve transfers and avoid falls.     Goals Addressed             This Visit's Progress    RN Care Coordination Activities: further follow up needed       Care Coordination Interventions: Completed successful outbound call with [name]  Evaluation of current treatment plan related to Incarcerated left inguinal hernia and patient's adherence to plan as established by provider Determined [name] received an email from [employer] advising the DME request has been denied, she is unsure why but states she was told the PCP needs to update the medical diagnosis Collaborated with PCP who advised the following diagnosis was used for the purpose of a transport chair, "gait disturbance, age related debility and frequent falls" Discussed with PCP, patient/sister should contact [employer] to determined why this diagnosis is not acceptable for this DME Placed outbound call to [name] to advise and she will f/u with the health plan, she will contact this RN if further assistance is needed    Interventions Today    Flowsheet Row Most Recent Value  General Interventions   General Interventions Discussed/Reviewed General Interventions Discussed, General Interventions Reviewed, Durable Medical Equipment (DME), Doctor Visits, Communication with  Doctor Visits Discussed/Reviewed Doctor Visits Discussed, Doctor Visits Reviewed, PCP, Specialist  [[employer]]  Durable Medical Equipment (DME) Nicoletta Ba chair, rollator walker]  Education Interventions   Education Provided Provided Education  Safety Interventions    Safety Discussed/Reviewed Safety Discussed, Safety Reviewed, Fall Risk, Home Safety  Home Safety Assistive Devices          SDOH assessments and interventions completed:  Yes     Care Coordination Interventions:  No, not indicated   Follow up plan: Follow up call scheduled for 04/29/23 @2 :00 PM     Encounter Outcome:  Pt. Visit Completed

## 2023-01-14 NOTE — Patient Instructions (Signed)
Visit Information  Thank you for taking time to visit with me today. Please don't hesitate to contact me if I can be of assistance to you.   Following are the goals we discussed today:   Goals Addressed             This Visit's Progress    RN Care Coordination Activities: further follow up needed       Care Coordination Interventions: Completed successful outbound call with sister [facility]  Evaluation of current treatment plan related to Incarcerated left inguinal hernia and patient's adherence to plan as established by provider Determined sister Roderic Scarce received an email from [employer] advising the DME request has been denied, she is unsure why but states she was told the PCP needs to update the medical diagnosis Collaborated with PCP who advised the following diagnosis was used for the purpose of a transport chair, "gait disturbance, age related debility and frequent falls" Discussed with PCP, patient/sister should contact [employer] to determined why this diagnosis is not acceptable for this DME Placed outbound call to sister Roderic Scarce to advise and she will f/u with the health plan, she will contact this RN if further assistance is needed        Our next appointment is by telephone on 04/29/23 at 2:00 PM  Please call the care guide team at [phone] if you need to cancel or reschedule your appointment.   If you are experiencing a Mental Health or Behavioral Health Crisis or need someone to talk to, please call 1-800-273-TALK (toll free, 24 hour hotline)  The patient verbalized understanding of instructions, educational materials, and care plan provided today and DECLINED offer to receive copy of patient instructions, educational materials, and care plan.   Delsa Sale, RN, BSN, CCM Care Management Coordinator [facility] Care Management Direct Phone: [phone]

## 2023-01-14 NOTE — Progress Notes (Signed)
Pharmacy Quality Measure Review  This patient is appearing on the insurance-provided list for being at risk of failing the adherence measure for diabetes medications this calendar year.   Medication: [name] 10 mg  Last fill date: 10/28/22 for 30 day supply  Patient had visit with RN CM today. Will coordinate.   Catie Eppie Gibson, PharmD, BCACP, CPP [facility] [phone]

## 2023-01-15 NOTE — Progress Notes (Signed)
Care Coordination Call  Contacted patient's pharmacy, confirmed last fill date. $0 copay for generic [name]. Called patient's sister, [name]. She believes she recently filled [place]. She's going to check at home and call me back.   Catie Eppie Gibson, PharmD, BCACP, CPP [facility] [phone]

## 2023-01-21 DIAGNOSIS — K7460 Unspecified cirrhosis of liver: Secondary | ICD-10-CM | POA: Diagnosis not present

## 2023-01-21 DIAGNOSIS — N1832 Chronic kidney disease, stage 3b: Secondary | ICD-10-CM | POA: Diagnosis not present

## 2023-01-21 DIAGNOSIS — I480 Paroxysmal atrial fibrillation: Secondary | ICD-10-CM | POA: Diagnosis not present

## 2023-01-21 DIAGNOSIS — K219 Gastro-esophageal reflux disease without esophagitis: Secondary | ICD-10-CM | POA: Diagnosis not present

## 2023-01-21 DIAGNOSIS — M1990 Unspecified osteoarthritis, unspecified site: Secondary | ICD-10-CM | POA: Diagnosis not present

## 2023-01-21 DIAGNOSIS — Z8673 Personal history of transient ischemic attack (TIA), and cerebral infarction without residual deficits: Secondary | ICD-10-CM | POA: Diagnosis not present

## 2023-01-21 DIAGNOSIS — I130 Hypertensive heart and chronic kidney disease with heart failure and stage 1 through stage 4 chronic kidney disease, or unspecified chronic kidney disease: Secondary | ICD-10-CM | POA: Diagnosis not present

## 2023-01-21 DIAGNOSIS — I2510 Atherosclerotic heart disease of native coronary artery without angina pectoris: Secondary | ICD-10-CM | POA: Diagnosis not present

## 2023-01-21 DIAGNOSIS — D631 Anemia in chronic kidney disease: Secondary | ICD-10-CM | POA: Diagnosis not present

## 2023-01-21 DIAGNOSIS — H409 Unspecified glaucoma: Secondary | ICD-10-CM | POA: Diagnosis not present

## 2023-01-21 DIAGNOSIS — I5032 Chronic diastolic (congestive) heart failure: Secondary | ICD-10-CM | POA: Diagnosis not present

## 2023-01-21 DIAGNOSIS — I739 Peripheral vascular disease, unspecified: Secondary | ICD-10-CM | POA: Diagnosis not present

## 2023-01-21 DIAGNOSIS — Z48815 Encounter for surgical aftercare following surgery on the digestive system: Secondary | ICD-10-CM | POA: Diagnosis not present

## 2023-01-21 DIAGNOSIS — E789 Disorder of lipoprotein metabolism, unspecified: Secondary | ICD-10-CM | POA: Diagnosis not present

## 2023-01-21 DIAGNOSIS — I340 Nonrheumatic mitral (valve) insufficiency: Secondary | ICD-10-CM | POA: Diagnosis not present

## 2023-01-21 DIAGNOSIS — M109 Gout, unspecified: Secondary | ICD-10-CM | POA: Diagnosis not present

## 2023-01-22 ENCOUNTER — Telehealth: Payer: Self-pay

## 2023-01-22 NOTE — Telephone Encounter (Signed)
Patient was called and inform that a RX for his transport wheelchair would be available for pick up Monday.

## 2023-01-22 NOTE — Progress Notes (Signed)
Care Coordination Call  Contacted [name] to follow up. She notes that he does not have dapagliflozin. Contacted pharmacy to ask them to refill, and [name] will also put this in the patient's pill box.   She also notes that the patient has more of a decreased appetite lately and is losing weight. They saw GI this week and had lab work done, she has not heard the results yet but they have a 2 week follow up.   We discussed meal supplements like Ensure. She said she bought some for Mr. Entrikin a few weeks ago, but he was not interested. She is going to try to get him to drink some again over the weekend.   Passing along to PCP and RN CM.   Catie Eppie Gibson, PharmD, BCACP, CPP [facility] [phone]

## 2023-01-26 NOTE — Progress Notes (Signed)
Carelink Summary Report / Loop Recorder 

## 2023-01-27 ENCOUNTER — Emergency Department (HOSPITAL_COMMUNITY): Payer: 59

## 2023-01-27 ENCOUNTER — Inpatient Hospital Stay (HOSPITAL_COMMUNITY)
Admission: EM | Admit: 2023-01-27 | Discharge: 2023-02-02 | DRG: 683 | Disposition: A | Discharge status: Home Health | Payer: 59 | Attending: Family Medicine | Admitting: Family Medicine

## 2023-01-27 ENCOUNTER — Other Ambulatory Visit: Payer: Self-pay

## 2023-01-27 ENCOUNTER — Telehealth: Payer: Self-pay

## 2023-01-27 DIAGNOSIS — Z8719 Personal history of other diseases of the digestive system: Secondary | ICD-10-CM

## 2023-01-27 DIAGNOSIS — M109 Gout, unspecified: Secondary | ICD-10-CM | POA: Diagnosis present

## 2023-01-27 DIAGNOSIS — I495 Sick sinus syndrome: Secondary | ICD-10-CM | POA: Diagnosis not present

## 2023-01-27 DIAGNOSIS — I2510 Atherosclerotic heart disease of native coronary artery without angina pectoris: Secondary | ICD-10-CM | POA: Diagnosis not present

## 2023-01-27 DIAGNOSIS — E785 Hyperlipidemia, unspecified: Secondary | ICD-10-CM | POA: Diagnosis present

## 2023-01-27 DIAGNOSIS — I639 Cerebral infarction, unspecified: Secondary | ICD-10-CM | POA: Diagnosis not present

## 2023-01-27 DIAGNOSIS — N400 Enlarged prostate without lower urinary tract symptoms: Secondary | ICD-10-CM

## 2023-01-27 DIAGNOSIS — Z8673 Personal history of transient ischemic attack (TIA), and cerebral infarction without residual deficits: Secondary | ICD-10-CM

## 2023-01-27 DIAGNOSIS — E871 Hypo-osmolality and hyponatremia: Secondary | ICD-10-CM | POA: Diagnosis present

## 2023-01-27 DIAGNOSIS — D631 Anemia in chronic kidney disease: Secondary | ICD-10-CM | POA: Diagnosis not present

## 2023-01-27 DIAGNOSIS — R627 Adult failure to thrive: Secondary | ICD-10-CM | POA: Diagnosis present

## 2023-01-27 DIAGNOSIS — K7210 Chronic hepatic failure without coma: Secondary | ICD-10-CM | POA: Diagnosis not present

## 2023-01-27 DIAGNOSIS — K828 Other specified diseases of gallbladder: Secondary | ICD-10-CM | POA: Diagnosis not present

## 2023-01-27 DIAGNOSIS — I080 Rheumatic disorders of both mitral and aortic valves: Secondary | ICD-10-CM | POA: Diagnosis present

## 2023-01-27 DIAGNOSIS — R109 Unspecified abdominal pain: Secondary | ICD-10-CM | POA: Diagnosis not present

## 2023-01-27 DIAGNOSIS — N179 Acute kidney failure, unspecified: Secondary | ICD-10-CM | POA: Diagnosis not present

## 2023-01-27 DIAGNOSIS — I739 Peripheral vascular disease, unspecified: Secondary | ICD-10-CM | POA: Diagnosis not present

## 2023-01-27 DIAGNOSIS — N1832 Chronic kidney disease, stage 3b: Secondary | ICD-10-CM | POA: Diagnosis not present

## 2023-01-27 DIAGNOSIS — I130 Hypertensive heart and chronic kidney disease with heart failure and stage 1 through stage 4 chronic kidney disease, or unspecified chronic kidney disease: Secondary | ICD-10-CM | POA: Diagnosis not present

## 2023-01-27 DIAGNOSIS — I4510 Unspecified right bundle-branch block: Secondary | ICD-10-CM | POA: Diagnosis present

## 2023-01-27 DIAGNOSIS — E861 Hypovolemia: Secondary | ICD-10-CM | POA: Diagnosis present

## 2023-01-27 DIAGNOSIS — I480 Paroxysmal atrial fibrillation: Secondary | ICD-10-CM | POA: Diagnosis present

## 2023-01-27 DIAGNOSIS — K7460 Unspecified cirrhosis of liver: Secondary | ICD-10-CM | POA: Diagnosis not present

## 2023-01-27 DIAGNOSIS — Z515 Encounter for palliative care: Secondary | ICD-10-CM

## 2023-01-27 DIAGNOSIS — Z8249 Family history of ischemic heart disease and other diseases of the circulatory system: Secondary | ICD-10-CM

## 2023-01-27 DIAGNOSIS — R64 Cachexia: Secondary | ICD-10-CM | POA: Diagnosis present

## 2023-01-27 DIAGNOSIS — K7031 Alcoholic cirrhosis of liver with ascites: Secondary | ICD-10-CM | POA: Diagnosis present

## 2023-01-27 DIAGNOSIS — Z48815 Encounter for surgical aftercare following surgery on the digestive system: Secondary | ICD-10-CM | POA: Diagnosis not present

## 2023-01-27 DIAGNOSIS — Z66 Do not resuscitate: Secondary | ICD-10-CM | POA: Diagnosis not present

## 2023-01-27 DIAGNOSIS — Z681 Body mass index (BMI) 19 or less, adult: Secondary | ICD-10-CM | POA: Diagnosis not present

## 2023-01-27 DIAGNOSIS — I5032 Chronic diastolic (congestive) heart failure: Secondary | ICD-10-CM | POA: Diagnosis present

## 2023-01-27 DIAGNOSIS — E872 Acidosis, unspecified: Secondary | ICD-10-CM | POA: Diagnosis not present

## 2023-01-27 DIAGNOSIS — R531 Weakness: Secondary | ICD-10-CM | POA: Diagnosis not present

## 2023-01-27 DIAGNOSIS — R001 Bradycardia, unspecified: Secondary | ICD-10-CM | POA: Diagnosis not present

## 2023-01-27 DIAGNOSIS — Z7984 Long term (current) use of oral hypoglycemic drugs: Secondary | ICD-10-CM

## 2023-01-27 DIAGNOSIS — N281 Cyst of kidney, acquired: Secondary | ICD-10-CM | POA: Diagnosis not present

## 2023-01-27 DIAGNOSIS — E875 Hyperkalemia: Secondary | ICD-10-CM

## 2023-01-27 DIAGNOSIS — R636 Underweight: Secondary | ICD-10-CM | POA: Diagnosis present

## 2023-01-27 DIAGNOSIS — D6959 Other secondary thrombocytopenia: Secondary | ICD-10-CM | POA: Diagnosis not present

## 2023-01-27 DIAGNOSIS — M1990 Unspecified osteoarthritis, unspecified site: Secondary | ICD-10-CM | POA: Diagnosis present

## 2023-01-27 DIAGNOSIS — D649 Anemia, unspecified: Secondary | ICD-10-CM | POA: Diagnosis present

## 2023-01-27 DIAGNOSIS — R630 Anorexia: Secondary | ICD-10-CM | POA: Diagnosis present

## 2023-01-27 DIAGNOSIS — R55 Syncope and collapse: Secondary | ICD-10-CM

## 2023-01-27 DIAGNOSIS — R0600 Dyspnea, unspecified: Secondary | ICD-10-CM | POA: Diagnosis not present

## 2023-01-27 DIAGNOSIS — E876 Hypokalemia: Secondary | ICD-10-CM | POA: Diagnosis present

## 2023-01-27 DIAGNOSIS — N184 Chronic kidney disease, stage 4 (severe): Secondary | ICD-10-CM | POA: Diagnosis not present

## 2023-01-27 DIAGNOSIS — E860 Dehydration: Secondary | ICD-10-CM | POA: Diagnosis not present

## 2023-01-27 DIAGNOSIS — Z95818 Presence of other cardiac implants and grafts: Secondary | ICD-10-CM

## 2023-01-27 DIAGNOSIS — K766 Portal hypertension: Secondary | ICD-10-CM | POA: Diagnosis not present

## 2023-01-27 DIAGNOSIS — Z823 Family history of stroke: Secondary | ICD-10-CM

## 2023-01-27 DIAGNOSIS — H409 Unspecified glaucoma: Secondary | ICD-10-CM | POA: Diagnosis present

## 2023-01-27 DIAGNOSIS — I340 Nonrheumatic mitral (valve) insufficiency: Secondary | ICD-10-CM | POA: Diagnosis not present

## 2023-01-27 DIAGNOSIS — K219 Gastro-esophageal reflux disease without esophagitis: Secondary | ICD-10-CM | POA: Diagnosis not present

## 2023-01-27 DIAGNOSIS — I471 Supraventricular tachycardia, unspecified: Secondary | ICD-10-CM | POA: Diagnosis present

## 2023-01-27 DIAGNOSIS — E789 Disorder of lipoprotein metabolism, unspecified: Secondary | ICD-10-CM | POA: Diagnosis not present

## 2023-01-27 DIAGNOSIS — Z79899 Other long term (current) drug therapy: Secondary | ICD-10-CM

## 2023-01-27 HISTORY — DX: Syncope and collapse: R55

## 2023-01-27 HISTORY — DX: Hyperkalemia: E87.5

## 2023-01-27 LAB — BASIC METABOLIC PANEL
Anion gap: 7 (ref 5–15)
BUN: 46 mg/dL — ABNORMAL HIGH (ref 8–23)
CO2: 18 mmol/L — ABNORMAL LOW (ref 22–32)
Calcium: 7.7 mg/dL — ABNORMAL LOW (ref 8.9–10.3)
Chloride: 103 mmol/L (ref 98–111)
Creatinine, Ser: 2.56 mg/dL — ABNORMAL HIGH (ref 0.61–1.24)
GFR, Estimated: 24 mL/min — ABNORMAL LOW (ref >60)
Glucose, Bld: 188 mg/dL — ABNORMAL HIGH (ref 70–99)
Potassium: 6.4 mmol/L (ref 3.5–5.1)
Sodium: 128 mmol/L — ABNORMAL LOW (ref 135–145)

## 2023-01-27 LAB — CBC WITH DIFFERENTIAL/PLATELET
Abs Immature Granulocytes: 0.03 10*3/uL (ref 0.00–0.07)
Basophils Absolute: 0 10*3/uL (ref 0.0–0.1)
Basophils Relative: 1 %
Eosinophils Absolute: 0.4 10*3/uL (ref 0.0–0.5)
Eosinophils Relative: 8 %
HCT: 39.4 % (ref 39.0–52.0)
Hemoglobin: 12.3 g/dL — ABNORMAL LOW (ref 13.0–17.0)
Immature Granulocytes: 1 %
Lymphocytes Relative: 25 %
Lymphs Abs: 1.1 10*3/uL (ref 0.7–4.0)
MCH: 27.5 pg (ref 26.0–34.0)
MCHC: 31.2 g/dL (ref 30.0–36.0)
MCV: 87.9 fL (ref 80.0–100.0)
Monocytes Absolute: 0.4 10*3/uL (ref 0.1–1.0)
Monocytes Relative: 8 %
Neutro Abs: 2.6 10*3/uL (ref 1.7–7.7)
Neutrophils Relative %: 57 %
Platelets: 217 10*3/uL (ref 150–400)
RBC: 4.48 MIL/uL (ref 4.22–5.81)
RDW: 15.3 % (ref 11.5–15.5)
WBC: 4.5 10*3/uL (ref 4.0–10.5)
nRBC: 0 % (ref 0.0–0.2)

## 2023-01-27 LAB — TROPONIN I (HIGH SENSITIVITY)
Troponin I (High Sensitivity): 11 ng/L (ref <18)
Troponin I (High Sensitivity): 8 ng/L (ref <18)

## 2023-01-27 LAB — BRAIN NATRIURETIC PEPTIDE: B Natriuretic Peptide: 547 pg/mL — ABNORMAL HIGH (ref 0.0–100.0)

## 2023-01-27 LAB — MAGNESIUM: Magnesium: 2.3 mg/dL (ref 1.7–2.4)

## 2023-01-27 LAB — TSH: TSH: 4.32 u[IU]/mL (ref 0.350–4.500)

## 2023-01-27 MED ORDER — FINASTERIDE 5 MG PO TABS
5.0000 mg | ORAL_TABLET | Freq: Every day | ORAL | Status: DC
  Administered 2023-01-27 – 2023-02-02 (×7): 5 mg via ORAL
  Filled 2023-01-27 (×7): qty 1

## 2023-01-27 MED ORDER — ONDANSETRON HCL 4 MG PO TABS: 4.0000 mg | ORAL_TABLET | Freq: Four times a day (QID) | ORAL | Status: DC | PRN

## 2023-01-27 MED ORDER — LATANOPROST 0.005 % OP SOLN
1.0000 [drp] | Freq: Every day | OPHTHALMIC | Status: DC
  Administered 2023-01-27 – 2023-02-01 (×6): 1 [drp] via OPHTHALMIC
  Filled 2023-01-27: qty 2.5

## 2023-01-27 MED ORDER — INSULIN ASPART 100 UNIT/ML IV SOLN
5.0000 [IU] | Freq: Once | INTRAVENOUS | Status: AC
  Administered 2023-01-27: 5 [IU] via INTRAVENOUS

## 2023-01-27 MED ORDER — ENOXAPARIN SODIUM 30 MG/0.3ML IJ SOSY
30.0000 mg | PREFILLED_SYRINGE | INTRAMUSCULAR | Status: DC
  Administered 2023-01-27 – 2023-01-28 (×2): 30 mg via SUBCUTANEOUS
  Filled 2023-01-27 (×2): qty 0.3

## 2023-01-27 MED ORDER — SODIUM ZIRCONIUM CYCLOSILICATE 10 G PO PACK
10.0000 g | PACK | Freq: Every day | ORAL | Status: DC
  Administered 2023-01-27 – 2023-01-29 (×3): 10 g via ORAL
  Filled 2023-01-27 (×3): qty 1

## 2023-01-27 MED ORDER — ATROPINE SULFATE 1 MG/ML IV SOLN
0.5000 mg | Freq: Once | INTRAVENOUS | Status: AC
  Administered 2023-01-27: 0.5 mg via INTRAVENOUS

## 2023-01-27 MED ORDER — TRAZODONE HCL 50 MG PO TABS
25.0000 mg | ORAL_TABLET | Freq: Every evening | ORAL | Status: DC | PRN
  Administered 2023-01-27: 25 mg via ORAL
  Filled 2023-01-27 (×2): qty 1

## 2023-01-27 MED ORDER — TRAMADOL HCL 50 MG PO TABS: 50.0000 mg | ORAL_TABLET | Freq: Two times a day (BID) | ORAL | Status: DC | PRN

## 2023-01-27 MED ORDER — ONDANSETRON HCL 4 MG/2ML IJ SOLN: 4.0000 mg | Freq: Four times a day (QID) | INTRAMUSCULAR | Status: DC | PRN

## 2023-01-27 MED ORDER — ACETAMINOPHEN 325 MG PO TABS
650.0000 mg | ORAL_TABLET | Freq: Four times a day (QID) | ORAL | Status: DC | PRN
  Administered 2023-01-29 – 2023-02-02 (×6): 650 mg via ORAL
  Filled 2023-01-27 (×6): qty 2

## 2023-01-27 MED ORDER — SODIUM CHLORIDE 0.9 % IV BOLUS
1000.0000 mL | Freq: Once | INTRAVENOUS | Status: AC
  Administered 2023-01-27: 1000 mL via INTRAVENOUS

## 2023-01-27 MED ORDER — MAGNESIUM HYDROXIDE 400 MG/5ML PO SUSP: 30.0000 mL | Freq: Every day | ORAL | Status: DC | PRN

## 2023-01-27 MED ORDER — ACETAMINOPHEN 650 MG RE SUPP: 650.0000 mg | Freq: Four times a day (QID) | RECTAL | Status: DC | PRN

## 2023-01-27 MED ORDER — CARVEDILOL 6.25 MG PO TABS: 6.2500 mg | ORAL_TABLET | Freq: Two times a day (BID) | ORAL | Status: DC

## 2023-01-27 MED ORDER — BRINZOLAMIDE 1 % OP SUSP
1.0000 [drp] | Freq: Three times a day (TID) | OPHTHALMIC | Status: DC
  Administered 2023-01-27 – 2023-02-02 (×18): 1 [drp] via OPHTHALMIC
  Filled 2023-01-27: qty 10

## 2023-01-27 MED ORDER — ATROPINE SULFATE 1 MG/10ML IJ SOSY
0.5000 mg | PREFILLED_SYRINGE | Freq: Once | INTRAMUSCULAR | Status: AC
  Administered 2023-01-27: 0.5 mg via INTRAVENOUS

## 2023-01-27 MED ORDER — DOPAMINE-DEXTROSE 3.2-5 MG/ML-% IV SOLN
0.0000 ug/kg/min | INTRAVENOUS | Status: DC
  Filled 2023-01-27: qty 250

## 2023-01-27 MED ORDER — LACTULOSE 10 GM/15ML PO SOLN: 10.0000 g | Freq: Three times a day (TID) | ORAL | Status: DC | PRN

## 2023-01-27 MED ORDER — CALCIUM GLUCONATE 10 % IV SOLN
1.0000 g | Freq: Once | INTRAVENOUS | Status: AC
  Administered 2023-01-27: 1 g via INTRAVENOUS
  Filled 2023-01-27: qty 10

## 2023-01-27 MED ORDER — SODIUM CHLORIDE 0.9 % IV SOLN: INTRAVENOUS | Status: DC

## 2023-01-27 MED ORDER — FERROUS SULFATE 325 (65 FE) MG PO TABS
325.0000 mg | ORAL_TABLET | Freq: Every day | ORAL | Status: DC
  Administered 2023-01-28 – 2023-02-02 (×6): 325 mg via ORAL
  Filled 2023-01-27 (×6): qty 1

## 2023-01-27 MED ORDER — PIPERACILLIN-TAZOBACTAM 3.375 G IVPB 30 MIN
3.3750 g | Freq: Once | INTRAVENOUS | Status: AC
  Administered 2023-01-27: 3.375 g via INTRAVENOUS
  Filled 2023-01-27: qty 50

## 2023-01-27 MED ORDER — DEXTROSE 50 % IV SOLN
1.0000 | Freq: Once | INTRAVENOUS | Status: AC
  Administered 2023-01-27: 50 mL via INTRAVENOUS
  Filled 2023-01-27: qty 50

## 2023-01-27 MED ORDER — VANCOMYCIN HCL IN DEXTROSE 1-5 GM/200ML-% IV SOLN
1000.0000 mg | Freq: Once | INTRAVENOUS | Status: AC
  Administered 2023-01-27: 1000 mg via INTRAVENOUS
  Filled 2023-01-27: qty 200

## 2023-01-27 NOTE — ED Provider Notes (Signed)
[place] EMERGENCY DEPARTMENT AT [facility] Provider Note   CSN: [id number] Arrival date & time: 01/27/23  1722     History  Chief Complaint  Patient presents with   Fatigue    Trevor Murphy is a 88 y.o. male.  71 yoM with a chief complaints of fatigue.  This been going on for couple days.  Not eating and drinking as much as he normally does.  Was admitted recently for an incarcerated hernia.  He had an episode in the waiting room where he had up losing consciousness.  Son tells me that he stiffened up and was making some funny noises.  Not making them on arrival to the bed but is quite bradycardic with heart rates going into the 20s.        Home Medications Prior to Admission medications   Medication Sig Start Date End Date Taking? Authorizing Provider  acetaminophen (TYLENOL) 500 MG tablet Take 1,000 mg by mouth 2 (two) times daily as needed for mild pain or moderate pain (leg pain).   Yes [provider]  carvedilol (COREG) 6.25 MG tablet Take 1 tablet (6.25 mg total) by mouth 2 (two) times daily with a meal. 11/27/22  Yes Almon Hercules, MD  dapagliflozin propanediol ([name]) 10 MG TABS tablet Take 1 tablet (10 mg total) by mouth daily before breakfast. 11/18/22  Yes [name], FNP  ferrous sulfate (FEROSUL) 325 (65 FE) MG tablet TAKE 1 TABLET(325 MG) BY MOUTH DAILY Patient taking differently: Take 325 mg by mouth daily. 11/09/22  Yes [name], FNP  finasteride (PROSCAR) 5 MG tablet Take 5 mg by mouth daily. 03/09/22  Yes [provider]  furosemide (LASIX) 20 MG tablet Take 1 tablet (20 mg total) by mouth daily. 11/27/22  Yes Gonfa, Taye T, MD  LUMIGAN 0.01 % SOLN Place 1 drop into both eyes at bedtime. 08/28/19  Yes [provider]  SIMBRINZA 1-0.2 % SUSP Follow up with your PCP on when to restart this medication. Patient taking differently: Place 1 drop into both eyes 3 (three) times daily. 06/09/22  Yes Narda Bonds, MD   spironolactone (ALDACTONE) 50 MG tablet Take 1 tablet (50 mg total) by mouth daily. 09/29/22  Yes Lewie Chamber, MD  lactulose (CHRONULAC) 10 GM/15ML solution Take 15 mLs (10 g total) by mouth 3 (three) times daily as needed for mild constipation. Patient not taking: Reported on 01/27/2023 11/27/22   Almon Hercules, MD  traMADol (ULTRAM) 50 MG tablet Take 1 tablet (50 mg total) by mouth every 6 (six) hours as needed for moderate pain. Patient not taking: Reported on 01/27/2023 11/27/22   Barnetta Chapel, PA-C      Allergies    Patient has no known allergies.    Review of Systems   Review of Systems  Physical Exam Updated Vital Signs BP 132/62   Pulse (!) 49   Temp 98 F (36.7 C)   Resp 17   Ht 5\' 10"  (1.778 m)   Wt 52.2 kg   SpO2 100%   BMI 16.50 kg/m  Physical Exam Vitals and nursing note reviewed.  Constitutional:      Appearance: He is well-developed.  HENT:     Head: Normocephalic and atraumatic.  Eyes:     Pupils: Pupils are equal, round, and reactive to light.  Neck:     Vascular: No JVD.  Cardiovascular:     Rate and Rhythm: Regular rhythm. Bradycardia present.     Heart  sounds: No murmur heard.    No friction rub. No gallop.  Pulmonary:     Effort: No respiratory distress.     Breath sounds: No wheezing.  Abdominal:     General: There is no distension.     Tenderness: There is no abdominal tenderness. There is no guarding or rebound.  Musculoskeletal:        General: Normal range of motion.     Cervical back: Normal range of motion and neck supple.  Skin:    Coloration: Skin is not pale.     Findings: No rash.  Neurological:     Mental Status: He is alert and oriented to person, place, and time.  Psychiatric:        Behavior: Behavior normal.     ED Results / Procedures / Treatments   Labs (all labs ordered are listed, but only abnormal results are displayed) Labs Reviewed  BASIC METABOLIC PANEL - Abnormal; Notable for the following components:       Result Value   Sodium 128 (*)    Potassium 6.4 (*)    CO2 18 (*)    Glucose, Bld 188 (*)    BUN 46 (*)    Creatinine, Ser 2.56 (*)    Calcium 7.7 (*)    GFR, Estimated 24 (*)    All other components within normal limits  BRAIN NATRIURETIC PEPTIDE - Abnormal; Notable for the following components:   B Natriuretic Peptide 547.0 (*)    All other components within normal limits  CBC WITH DIFFERENTIAL/PLATELET - Abnormal; Notable for the following components:   Hemoglobin 12.3 (*)    All other components within normal limits  MAGNESIUM  TSH  BASIC METABOLIC PANEL  CBC  TROPONIN I (HIGH SENSITIVITY)  TROPONIN I (HIGH SENSITIVITY)  TROPONIN I (HIGH SENSITIVITY)    EKG EKG Interpretation  Date/Time:  Wednesday January 27 2023 18:14:59 EDT Ventricular Rate:  28 PR Interval:    QRS Duration: 126 QT Interval:  648 QTC Calculation: 441 R Axis:   75 Text Interpretation: Atrial fibrillation with slow ventricular response Right bundle branch block Abnormal ECG Since last tracing rate slower Otherwise no significant change Confirmed by Melene Plan [phone]) on 01/27/2023 6:39:25 PM  Radiology CT ABDOMEN PELVIS WO CONTRAST  Result Date: 01/27/2023 CLINICAL DATA:  Acute abdominal pain EXAM: CT ABDOMEN AND PELVIS WITHOUT CONTRAST TECHNIQUE: Multidetector CT imaging of the abdomen and pelvis was performed following the standard protocol without IV contrast. RADIATION DOSE REDUCTION: This exam was performed according to the departmental dose-optimization program which includes automated exposure control, adjustment of the mA and/or kV according to patient size and/or use of iterative reconstruction technique. COMPARISON:  11/22/2022 FINDINGS: Lower chest: Mild atelectatic changes are noted bilaterally. Hepatobiliary: Gallbladder is well distended. Liver appears within normal limits. Pancreas: Unremarkable. No pancreatic ductal dilatation or surrounding inflammatory changes. Spleen: Normal in size without  focal abnormality. Adrenals/Urinary Tract: Adrenal glands are within normal limits. Kidneys demonstrate renal cystic change which appears simple in nature. No follow-up is recommended. No renal calculi or obstructive changes are seen. The bladder is decompressed. Stomach/Bowel: No obstructive or inflammatory changes of the colon are seen. Appendix is within normal limits. Contrast is seen within the stomach and proximal small bowel. No inflammatory changes are seen. Vascular/Lymphatic: Aortic atherosclerosis. No enlarged abdominal or pelvic lymph nodes. Reproductive: Prostate is unremarkable. Other: No abdominal wall hernia or abnormality. No abdominopelvic ascites. Musculoskeletal: Multilevel degenerative changes are noted without acute abnormality. IMPRESSION: No acute  abnormality is noted to correspond with the given clinical history. Electronically Signed   By: Alcide Clever M.D.   On: 01/27/2023 20:53   DG Chest Portable 1 View  Result Date: 01/27/2023 CLINICAL DATA:  dyspnea EXAM: PORTABLE CHEST 1 VIEW COMPARISON:  CXR 09/23/22 FINDINGS: Left-sided loop recorder device in place. There is a hazy opacity at the left lung base could represent atelectasis or infection. No radiographically apparent displaced rib fractures. Normal cardiac and mediastinal contours. Visualized upper abdomen is notable for gas distended loops of bowel. There is subdiaphragmatic lucency that could represent air trapped in a gas distended stomach, but pneumoperitoneum is not excluded. Recommend further evaluation with a dedicated abdominal radiograph. IMPRESSION: 1. Hazy opacity at the left lung base could represent atelectasis or infection. 2. Subdiaphragmatic lucency could represent air trapped in a gas distended stomach, but pneumoperitoneum is not excluded. Recommend further evaluation with a dedicated abdominal radiograph. Electronically Signed   By: Lorenza Cambridge M.D.   On: 01/27/2023 18:39    Procedures .Critical  Care  Performed by: Melene Plan, DO Authorized by: Melene Plan, DO   Critical care provider statement:    Critical care time (minutes):  80   Critical care time was exclusive of:  Separately billable procedures and treating other patients   Critical care was time spent personally by me on the following activities:  Development of treatment plan with patient or surrogate, discussions with consultants, evaluation of patient's response to treatment, examination of patient, ordering and review of laboratory studies, ordering and review of radiographic studies, ordering and performing treatments and interventions, pulse oximetry, re-evaluation of patient's condition and review of old charts   Care discussed with: admitting provider   .1-3 Lead EKG Interpretation  Performed by: Melene Plan, DO Authorized by: Melene Plan, DO     Interpretation: abnormal     ECG rate:  52   ECG rate assessment: bradycardic     Rhythm: sinus rhythm     Ectopy: none     Conduction: normal       Medications Ordered in ED Medications  sodium zirconium cyclosilicate (LOKELMA) packet 10 g (10 g Oral Given 01/27/23 1941)  traMADol (ULTRAM) tablet 50 mg (has no administration in time range)  carvedilol (COREG) tablet 6.25 mg (has no administration in time range)  lactulose (CHRONULAC) 10 GM/15ML solution 10 g (has no administration in time range)  finasteride (PROSCAR) tablet 5 mg (has no administration in time range)  ferrous sulfate tablet 325 mg (has no administration in time range)  latanoprost (XALATAN) 0.005 % ophthalmic solution 1 drop (has no administration in time range)  brinzolamide (AZOPT) 1 % ophthalmic suspension 1 drop (has no administration in time range)  enoxaparin (LOVENOX) injection 30 mg (has no administration in time range)  0.9 %  sodium chloride infusion (has no administration in time range)  acetaminophen (TYLENOL) tablet 650 mg (has no administration in time range)    Or  acetaminophen  (TYLENOL) suppository 650 mg (has no administration in time range)  traZODone (DESYREL) tablet 25 mg (has no administration in time range)  magnesium hydroxide (MILK OF MAGNESIA) suspension 30 mL (has no administration in time range)  ondansetron (ZOFRAN) tablet 4 mg (has no administration in time range)    Or  ondansetron (ZOFRAN) injection 4 mg (has no administration in time range)  atropine 1 MG/10ML injection 0.5 mg (0.5 mg Intravenous Given 01/27/23 1825)  sodium chloride 0.9 % bolus 1,000 mL (0 mLs Intravenous Stopped 01/27/23  1941)  vancomycin (VANCOCIN) IVPB 1000 mg/200 mL premix (0 mg Intravenous Stopped 01/27/23 2044)  piperacillin-tazobactam (ZOSYN) IVPB 3.375 g (0 g Intravenous Stopped 01/27/23 2017)  calcium gluconate inj 10% (1 g) URGENT USE ONLY! (1 g Intravenous Given 01/27/23 1941)  insulin aspart (novoLOG) injection 5 Units (5 Units Intravenous Given 01/27/23 1941)    And  dextrose 50 % solution 50 mL (50 mLs Intravenous Given 01/27/23 1941)  sodium chloride 0.9 % bolus 1,000 mL (0 mLs Intravenous Stopped 01/27/23 2125)    ED Course/ Medical Decision Making/ A&P                             Medical Decision Making Amount and/or Complexity of Data Reviewed Radiology: ordered.  Risk OTC drugs. Prescription drug management. Decision regarding hospitalization.   88 yo M with a chief complaints of loss of consciousness and fatigue for the past couple days.  Not really eating and drinking as well as he normally does.  He had an episode in the waiting room where he got very stiff.  Is now reportedly back to baseline.  Patient was very bradycardic when I saw him initially and he was given half a milligram of atropine with some improvement.  He continues to be in the 40s and 50s now and is back to baseline.  I am not sure if perhaps he had a vagal event.  No history of seizures.  Plain film of the chest radiology read with concern for pneumonia and possible pneumoperitoneum.  Will  obtain CT imaging.  CT scan without obvious pneumoperitoneum, no obvious acute finding.  Patient does have an acute kidney injury.  His potassium also is 6.5.  He was temporized.  Had narrowing of his QRS complex and now has P waves.  Discussed case with medicine for admission.  The patients results and plan were reviewed and discussed.   Any x-rays performed were independently reviewed by myself.   Differential diagnosis were considered with the presenting HPI.  Medications  sodium zirconium cyclosilicate (LOKELMA) packet 10 g (10 g Oral Given 01/27/23 1941)  traMADol (ULTRAM) tablet 50 mg (has no administration in time range)  carvedilol (COREG) tablet 6.25 mg (has no administration in time range)  lactulose (CHRONULAC) 10 GM/15ML solution 10 g (has no administration in time range)  finasteride (PROSCAR) tablet 5 mg (has no administration in time range)  ferrous sulfate tablet 325 mg (has no administration in time range)  latanoprost (XALATAN) 0.005 % ophthalmic solution 1 drop (has no administration in time range)  brinzolamide (AZOPT) 1 % ophthalmic suspension 1 drop (has no administration in time range)  enoxaparin (LOVENOX) injection 30 mg (has no administration in time range)  0.9 %  sodium chloride infusion (has no administration in time range)  acetaminophen (TYLENOL) tablet 650 mg (has no administration in time range)    Or  acetaminophen (TYLENOL) suppository 650 mg (has no administration in time range)  traZODone (DESYREL) tablet 25 mg (has no administration in time range)  magnesium hydroxide (MILK OF MAGNESIA) suspension 30 mL (has no administration in time range)  ondansetron (ZOFRAN) tablet 4 mg (has no administration in time range)    Or  ondansetron (ZOFRAN) injection 4 mg (has no administration in time range)  atropine 1 MG/10ML injection 0.5 mg (0.5 mg Intravenous Given 01/27/23 1825)  sodium chloride 0.9 % bolus 1,000 mL (0 mLs Intravenous Stopped 01/27/23 1941)   vancomycin (VANCOCIN) IVPB 1000  mg/200 mL premix (0 mg Intravenous Stopped 01/27/23 2044)  piperacillin-tazobactam (ZOSYN) IVPB 3.375 g (0 g Intravenous Stopped 01/27/23 2017)  calcium gluconate inj 10% (1 g) URGENT USE ONLY! (1 g Intravenous Given 01/27/23 1941)  insulin aspart (novoLOG) injection 5 Units (5 Units Intravenous Given 01/27/23 1941)    And  dextrose 50 % solution 50 mL (50 mLs Intravenous Given 01/27/23 1941)  sodium chloride 0.9 % bolus 1,000 mL (0 mLs Intravenous Stopped 01/27/23 2125)    Vitals:   01/27/23 1900 01/27/23 2015 01/27/23 2115 01/27/23 2145  BP: 128/62 133/64 (!) 153/69 132/62  Pulse: (!) 51 (!) 50 (!) 51 (!) 49  Resp: 16 18 16 17   Temp:   98 F (36.7 C)   TempSrc:      SpO2: 100% 100% 100% 100%  Weight:      Height:        Final diagnoses:  AKI (acute kidney injury) (HCC)  Hyperkalemia  Bradycardia    Admission/ observation were discussed with the admitting physician, patient and/or family and they are comfortable with the plan.          Final Clinical Impression(s) / ED Diagnoses Final diagnoses:  AKI (acute kidney injury) (HCC)  Hyperkalemia  Bradycardia    Rx / DC Orders ED Discharge Orders     None         Melene Plan, DO 01/27/23 2222

## 2023-01-27 NOTE — ED Notes (Signed)
This Clinical research associate was called into the room.  Pt noted to be unresponsive and having seizure-like activity.  Pt noted to be breathing and heart rate noted on EKG machine.  Pt continued to be unresponsive while being transported to room.

## 2023-01-27 NOTE — Assessment & Plan Note (Signed)
-   We will continue his lactulose. - Aldactone is being held off given his hyperkalemia.

## 2023-01-27 NOTE — Assessment & Plan Note (Addendum)
-   Coreg will be held off given his bradycardia.

## 2023-01-27 NOTE — Assessment & Plan Note (Addendum)
-   This likely prerenal due to volume depletion and dehydration from recent anorexia.  He has associated hyponatremia likely hypovolemic. - The patient will be admitted to a medical telemetry bed. - We will continue hydration with IV normal saline. - We will avoid nephrotoxins. - We will follow BMPs.

## 2023-01-27 NOTE — Telephone Encounter (Signed)
Patients sister called stating his home health therapist told her to call and get an appointment tomorrow for low vitals and he has low energy. Provider out until Tuesday of next week. No available appts tomorrow with other provider- patient was encouraged to go to Hospital or urgent care for this ASAP.

## 2023-01-27 NOTE — ED Triage Notes (Signed)
Pt c/o increasing fatigue and low heart rate (43) x2 days.  Denies pain and n/v/d.  Pt reports he hasn't been drinking much water.  Pt's family reports he was started on Lasix after admission in April.  Pt was admitted for an incarcerated hernia.

## 2023-01-27 NOTE — H&P (Addendum)
[place]   PATIENT NAME: Trevor Murphy    MR#:  [id number]  DATE OF BIRTH:  08-Jun-1935  DATE OF ADMISSION:  01/27/2023  PRIMARY CARE PHYSICIAN: [name], FNP   Patient is coming from: Home  REQUESTING/REFERRING PHYSICIAN: Melene Plan, DO   CHIEF COMPLAINT:   Chief Complaint  Patient presents with   Fatigue    HISTORY OF PRESENT ILLNESS:  Trevor Murphy is a 88 y.o. male with medical history significant for CAD artery disease, chronic diastolic CHF, paroxysmal atrial fibrillation, liver cirrhosis, essential hypertension, gout, glaucoma, osteoarthritis, stage III chronic kidney disease and SVT, who presented to the emergency room with acute onset of fatigue and tiredness with anorexia over the last couple of days to fluids and solid food.  He had a brief episode of unconsciousness while he was in the ER for about 2 to 3 minutes.  He was not witnessed to have a seizure.  No tongue bites, urinary or stool incontinence.  He was quite bradycardic with a heart rate going down to the 20s when he came for which she was given 0.5 mg of IV atropine.  No chest pain or palpitations.  No cough or wheezing or hemoptysis at home.  He had mild cough only with his syncope..  No dysuria, oliguria, hematuria, urgency or frequency or flank pain.  No paresthesias or focal muscle weakness.  No recent fever or chills.  No nausea or vomiting or abdominal pain or diarrhea.  ED Course: When he came to the ER, BP was 104/48 with a heart rate of 43 and later 38 respiratory rate of 16 and pulse oximetry 100% on room air.  Labs revealed hyperkalemia of 6.4, hyponatremia 128, CO2 of 18 and glucose 188 calcium 7.7 and BUN 46 with creatinine 2.56 compared to 14/1.39 on 11/27/2022.  High sensitive troponin I was 11 and BNP 547.  CBC showed hemoglobin 12.3 hematocrit 39.4 compared to 9.6 and 28.8 on 11/27/22.[name] Kitchen  TSH was 4.32 EKG as reviewed by me :EKG showed likely junctional bradycardia with rate of 28 with right  bundle branch block. Imaging: Portable chest x-ray showed the following: 1. Hazy opacity at the left lung base could represent atelectasis or infection. 2. Subdiaphragmatic lucency could represent air trapped in a gas distended stomach, but pneumoperitoneum is not excluded. Recommend further evaluation with a dedicated abdominal radiograph. Abdominal and pelvic CT scan without contrast revealed no acute abnormality.  The patient was given an amp of calcium gluconate, an ampule of D50 and 5 units of this IV NovoLog, 0.5 mg of IV atropine, IV vancomycin and Zosyn and 10 g of p.o. Lokelma.  He will be admitted to a medical telemetry bed for further evaluation and management. PAST MEDICAL HISTORY:   Past Medical History:  Diagnosis Date   Arthritis    CAD (coronary artery disease)    a. Cath 08/2009: minimal nonobstructive disease (25% LAD). b. Lexiscan nuc 6/215: EF 54%, no ischemia or infarction.   Chronic diastolic CHF (congestive heart failure) (HCC)    a. Echo 2013: EF 55-60%, mild AI, mild MR. b. Echo 5/15 with EF 60-65%, no WMA, mild LVH, mild AI.   CKD (chronic kidney disease) stage 3, GFR 30-59 ml/min (HCC) 07/26/2012   a. Felt likely due to HTN.   CVA (cerebral infarction) 07/28/2012   Glaucoma    Gout    H/O umbilical hernia repair    Headache(784.0)    " WHEN MY BLOOD PRESSURE IS  UP"   History of acute renal failure    History of alcohol abuse    HTN (hypertension)    Hypertension    a. Patent renal arteries 2005.   Noncompliance    OA (osteoarthritis)    Other and unspecified hyperlipidemia    Poor circulation    Sinus bradycardia    SVT (supraventricular tachycardia)    a. Possible AVNRT.  Got adenosine from EMS in 2015 for SVT.  During 5/15 hospitalization had episode of short R-P tachycardia (possible AVNRT) terminated by adenosine.  b. 04/2016 - recurrent tachycardia (SVT vs. AVNRT), again terminated by Adenosine   Visual impairment   -Liver cirrhosis  PAST  SURGICAL HISTORY:   Past Surgical History:  Procedure Laterality Date   APPENDECTOMY     BIOPSY  08/12/2020   Procedure: BIOPSY;  Surgeon: Charna Elizabeth, MD;  Location: [facility] ENDOSCOPY;  Service: Endoscopy;;   BUBBLE STUDY  05/24/2019   Procedure: BUBBLE STUDY;  Surgeon: Little Ishikawa, MD;  Location: [facility] ENDOSCOPY;  Service: Endoscopy;;   CARDIAC CATHETERIZATION  11/2003   EF 50-55%   CARDIAC CATHETERIZATION  08/2009   CATARACT EXTRACTION EXTRACAPSULAR Right 05/17/2013   Procedure: CATARACT EXTRACTION EXTRACAPSULAR WITH INTRAOCULAR LENS PLACEMENT (IOC) RIGHT EYE;  Surgeon: Chalmers Guest, MD;  Location: [facility] OR;  Service: Ophthalmology;  Laterality: Right;   COLONOSCOPY     ESOPHAGOGASTRODUODENOSCOPY (EGD) WITH PROPOFOL N/A 08/12/2020   Procedure: ESOPHAGOGASTRODUODENOSCOPY (EGD) WITH PROPOFOL;  Surgeon: Charna Elizabeth, MD;  Location: [facility] ENDOSCOPY;  Service: Endoscopy;  Laterality: N/A;   HERNIA REPAIR     INGUINAL HERNIA REPAIR Left 11/20/2022   Procedure: OPEN LEFT INGUINAL HERNIA REPAIR FOR GANGRENE AND SMALL BOWEL RESECTION WITH STINASTIMOSIST;  Surgeon: Kinsinger, De Blanch, MD;  Location: [facility] OR;  Service: General;  Laterality: Left;   IR PARACENTESIS  09/24/2022   LOOP RECORDER INSERTION N/A 05/24/2019   Procedure: LOOP RECORDER INSERTION;  Surgeon: Regan Lemming, MD;  Location: [facility] INVASIVE CV LAB;  Service: Cardiovascular;  Laterality: N/A;   TEE WITHOUT CARDIOVERSION N/A 05/24/2019   Procedure: TRANSESOPHAGEAL ECHOCARDIOGRAM (TEE);  Surgeon: Little Ishikawa, MD;  Location: [facility] ENDOSCOPY;  Service: Endoscopy;  Laterality: N/A;   VENTRAL HERNIA REPAIR  03/07/2012   Procedure: LAPAROSCOPIC VENTRAL HERNIA;  Surgeon: Cherylynn Ridges, MD;  Location: [facility] OR;  Service: General;  Laterality: N/A;    SOCIAL HISTORY:   Social History   Tobacco Use   Smoking status: Never   Smokeless tobacco: Never  Substance Use Topics   Alcohol use: No    Alcohol/week: 1.0 standard drink of  alcohol    Types: 1 Cans of beer per week    Comment: h/o heavy use, quit in 2017    FAMILY HISTORY:   Family History  Problem Relation Age of Onset   CVA Mother    Hypertension Brother    Hypertension Sister    Hypertension Brother    Stroke Sister    Heart attack Neg Hx     DRUG ALLERGIES:  No Known Allergies  REVIEW OF SYSTEMS:    As per history of present illness. All pertinent systems were reviewed above. Constitutional, HEENT, cardiovascular, respiratory, GI, GU, musculoskeletal, neuro, psychiatric, endocrine, integumentary and hematologic systems were reviewed and are otherwise negative/unremarkable except for positive findings mentioned above in the HPI.   MEDICATIONS AT HOME:   Prior to Admission medications   Medication Sig Start Date End Date Taking? Authorizing Provider  acetaminophen (TYLENOL) 500 MG tablet Take 1,000  mg by mouth 2 (two) times daily as needed for mild pain or moderate pain (leg pain).    [provider]  carvedilol (COREG) 6.25 MG tablet Take 1 tablet (6.25 mg total) by mouth 2 (two) times daily with a meal. 11/27/22   Almon Hercules, MD  dapagliflozin propanediol ([name]) 10 MG TABS tablet Take 1 tablet (10 mg total) by mouth daily before breakfast. 11/18/22   [name], FNP  ferrous sulfate (FEROSUL) 325 (65 FE) MG tablet TAKE 1 TABLET(325 MG) BY MOUTH DAILY 11/09/22   [name], FNP  finasteride (PROSCAR) 5 MG tablet Take 5 mg by mouth daily. 03/09/22   [provider]  furosemide (LASIX) 20 MG tablet Take 1 tablet (20 mg total) by mouth daily. 11/27/22   Almon Hercules, MD  lactulose (CHRONULAC) 10 GM/15ML solution Take 15 mLs (10 g total) by mouth 3 (three) times daily as needed for mild constipation. 11/27/22   Almon Hercules, MD  LUMIGAN 0.01 % SOLN Place 1 drop into both eyes at bedtime. 08/28/19   [provider]  SIMBRINZA 1-0.2 % SUSP Follow up with your PCP on when to restart this medication. Patient taking  differently: Place 1 drop into both eyes 3 (three) times daily. 06/09/22   Narda Bonds, MD  spironolactone (ALDACTONE) 50 MG tablet Take 1 tablet (50 mg total) by mouth daily. 09/29/22   Lewie Chamber, MD  traMADol (ULTRAM) 50 MG tablet Take 1 tablet (50 mg total) by mouth every 6 (six) hours as needed for moderate pain. 11/27/22   Barnetta Chapel, PA-C      VITAL SIGNS:  Blood pressure (!) 116/48, pulse 60, temperature (!) 96 F (35.6 C), resp. rate 16, height 5\' 10"  (1.778 m), weight 52.2 kg, SpO2 97 %.  PHYSICAL EXAMINATION:  Physical Exam  GENERAL:  88 y.o.-year-old slim African-American male patient lying in the bed with no acute distress.  He was somnolent but arousable.  He answers questions appropriately. EYES: Pupils equal, round, reactive to light and accommodation. No scleral icterus. Extraocular muscles intact.  HEENT: Head atraumatic, normocephalic. Oropharynx and nasopharynx clear.  NECK:  Supple, no jugular venous distention. No thyroid enlargement, no tenderness.  LUNGS: Normal breath sounds bilaterally, no wheezing, rales,rhonchi or crepitation. No use of accessory muscles of respiration.  CARDIOVASCULAR: Regular rate and rhythm, bradycardic, S1, S2 normal. No murmurs, rubs, or gallops.  ABDOMEN: Soft, nondistended, nontender. Bowel sounds present. No organomegaly or mass.  EXTREMITIES: No pedal edema, cyanosis, or clubbing.  NEUROLOGIC: Cranial nerves II through XII are intact. Muscle strength 5/5 in all extremities. Sensation intact. Gait not checked.  PSYCHIATRIC: The patient is alert and oriented x 3.  Normal affect and good eye contact. SKIN: No obvious rash, lesion, or ulcer.   LABORATORY PANEL:   CBC Recent Labs  Lab 01/27/23 1749  WBC 4.5  HGB 12.3*  HCT 39.4  PLT 217   ------------------------------------------------------------------------------------------------------------------  Chemistries  Recent Labs  Lab 01/27/23 1749  NA 128*  K 6.4*   CL 103  CO2 18*  GLUCOSE 188*  BUN 46*  CREATININE 2.56*  CALCIUM 7.7*  MG 2.3   ------------------------------------------------------------------------------------------------------------------  Cardiac Enzymes No results for input(s): "TROPONINI" in the last 168 hours. ------------------------------------------------------------------------------------------------------------------  RADIOLOGY:  CT ABDOMEN PELVIS WO CONTRAST  Result Date: 01/27/2023 CLINICAL DATA:  Acute abdominal pain EXAM: CT ABDOMEN AND PELVIS WITHOUT CONTRAST TECHNIQUE: Multidetector CT imaging of the abdomen and pelvis was performed following the standard protocol without IV  contrast. RADIATION DOSE REDUCTION: This exam was performed according to the departmental dose-optimization program which includes automated exposure control, adjustment of the mA and/or kV according to patient size and/or use of iterative reconstruction technique. COMPARISON:  11/22/2022 FINDINGS: Lower chest: Mild atelectatic changes are noted bilaterally. Hepatobiliary: Gallbladder is well distended. Liver appears within normal limits. Pancreas: Unremarkable. No pancreatic ductal dilatation or surrounding inflammatory changes. Spleen: Normal in size without focal abnormality. Adrenals/Urinary Tract: Adrenal glands are within normal limits. Kidneys demonstrate renal cystic change which appears simple in nature. No follow-up is recommended. No renal calculi or obstructive changes are seen. The bladder is decompressed. Stomach/Bowel: No obstructive or inflammatory changes of the colon are seen. Appendix is within normal limits. Contrast is seen within the stomach and proximal small bowel. No inflammatory changes are seen. Vascular/Lymphatic: Aortic atherosclerosis. No enlarged abdominal or pelvic lymph nodes. Reproductive: Prostate is unremarkable. Other: No abdominal wall hernia or abnormality. No abdominopelvic ascites. Musculoskeletal: Multilevel  degenerative changes are noted without acute abnormality. IMPRESSION: No acute abnormality is noted to correspond with the given clinical history. Electronically Signed   By: Alcide Clever M.D.   On: 01/27/2023 20:53   DG Chest Portable 1 View  Result Date: 01/27/2023 CLINICAL DATA:  dyspnea EXAM: PORTABLE CHEST 1 VIEW COMPARISON:  CXR 09/23/22 FINDINGS: Left-sided loop recorder device in place. There is a hazy opacity at the left lung base could represent atelectasis or infection. No radiographically apparent displaced rib fractures. Normal cardiac and mediastinal contours. Visualized upper abdomen is notable for gas distended loops of bowel. There is subdiaphragmatic lucency that could represent air trapped in a gas distended stomach, but pneumoperitoneum is not excluded. Recommend further evaluation with a dedicated abdominal radiograph. IMPRESSION: 1. Hazy opacity at the left lung base could represent atelectasis or infection. 2. Subdiaphragmatic lucency could represent air trapped in a gas distended stomach, but pneumoperitoneum is not excluded. Recommend further evaluation with a dedicated abdominal radiograph. Electronically Signed   By: Lorenza Cambridge M.D.   On: 01/27/2023 18:39      IMPRESSION AND PLAN:  Assessment and Plan: AKI (acute kidney injury) (HCC) - This likely prerenal due to volume depletion and dehydration from recent anorexia.  He has associated hyponatremia likely hypovolemic. - The patient will be admitted to a medical telemetry bed. - We will continue hydration with IV normal saline. - We will avoid nephrotoxins. - We will follow BMPs.  Syncope - This is likely secondary to symptomatic bradycardia. - 2D echo and cardiology consult to be obtained. - I notified Salley Hews about the patient. - We will check orthostatics every 12 hours. - She will be monitored for arrhythmias. - He had no evidence for hypoglycemia.  Hyperkalemia - This is clearly secondary to #1. -  This was aggressively managed and potassium will be followed.  Coronary artery disease involving native coronary artery of native heart without angina pectoris - Coreg will be held off given his bradycardia.  Chronic diastolic CHF (congestive heart failure) (HCC) - We will continue his  Marcelline Deist and hold off Coreg. - We are holding off Aldactone and Lasix given AKI.  BPH (benign prostatic hyperplasia) - We will continue his Proscar.  Alcoholic cirrhosis of liver with ascites (HCC) - We will continue his lactulose. - Aldactone is being held off given his hyperkalemia.   DVT prophylaxis: Lovenox.  Advanced Care Planning:  Code Status: full code.  Family Communication:  The plan of care was discussed in details with the patient (and  family). I answered all questions. The patient agreed to proceed with the above mentioned plan. Further management will depend upon hospital course. Disposition Plan: Back to previous home environment Consults called: none.  All the records are reviewed and case discussed with ED provider.  Status is: Inpatient   At the time of the admission, it appears that the appropriate admission status for this patient is inpatient.  This is judged to be reasonable and necessary in order to provide the required intensity of service to ensure the patient's safety given the presenting symptoms, physical exam findings and initial radiographic and laboratory data in the context of comorbid conditions.  The patient requires inpatient status due to high intensity of service, high risk of further deterioration and high frequency of surveillance required.  I certify that at the time of admission, it is my clinical judgment that the patient will require inpatient hospital care extending more than 2 midnights.                            Dispo: The patient is from: Home              Anticipated d/c is to: Home              Patient currently is not medically stable to d/c.               Difficult to place patient: No  Hannah Beat M.D on 01/27/2023 at 11:58 PM  [facility]   From 7 PM-7 AM, contact night-coverage [email or web address]  CC: Primary care physician; [name], FNP

## 2023-01-27 NOTE — Assessment & Plan Note (Addendum)
-   We will continue his  Trevor Murphy and hold off Coreg. - We are holding off Aldactone and Lasix given AKI.

## 2023-01-27 NOTE — Assessment & Plan Note (Signed)
-   This is likely secondary to symptomatic bradycardia. - 2D echo and cardiology consult to be obtained. - I notified Salley Hews about the patient. - We will check orthostatics every 12 hours. - She will be monitored for arrhythmias. - He had no evidence for hypoglycemia.

## 2023-01-27 NOTE — ED Provider Triage Note (Signed)
Emergency Medicine Provider Triage Evaluation Note  Trevor Murphy , a 88 y.o. male  was evaluated in triage.  Pt complains of fatigue for the past 2 days.  Denies chest pain, shortness of breath.  No other complaints.  Recently admitted for incarcerated hernia.  Started on Lasix since that admission.  Decreased hydration.  Review of Systems  Positive: As above Negative: As above  Physical Exam  BP (!) 104/48 (BP Location: Right Arm)   Pulse (!) 43   Temp 98 F (36.7 C) (Oral)   Resp 16   Ht 5\' 10"  (1.778 m)   Wt 52.2 kg   SpO2 100%   BMI 16.50 kg/m  Gen:   Awake, no distress   Resp:  Normal effort  MSK:   Moves extremities without difficulty  Other:  Bradycardic.  Per chart review this appears to be baseline  Medical Decision Making  Medically screening exam initiated at 5:47 PM.  Appropriate orders placed.  [name] was informed that the remainder of the evaluation will be completed by another provider, this initial triage assessment does not replace that evaluation, and the importance of remaining in the ED until their evaluation is complete.     [name], PA-C 01/27/23 [phone]

## 2023-01-27 NOTE — ED Notes (Addendum)
ED TO INPATIENT HANDOFF REPORT  ED Nurse Name and Phone #: [phone]  S Name/Age/Gender Trevor Murphy 88 y.o. male Room/Bed: 020C/020C  Code Status   Code Status: Prior  Home/SNF/Other Home Patient oriented to: self, place, time, and situation Is this baseline? Yes   Triage Complete: Triage complete  Chief Complaint AKI (acute kidney injury) (HCC) [N17.9]  Triage Note Pt c/o increasing fatigue and low heart rate (43) x2 days.  Denies pain and n/v/d.  Pt reports he hasn't been drinking much water.  Pt's family reports he was started on Lasix after admission in April.  Pt was admitted for an incarcerated hernia.    Allergies No Known Allergies  Level of Care/Admitting Diagnosis ED Disposition     ED Disposition  Admit   Condition  --   Comment  Hospital Area: [facility] [[id number]]  Level of Care: Telemetry Medical [104]  May admit patient to [facility] or [facility] if equivalent level of care is available:: No  Covid Evaluation: Asymptomatic - no recent exposure (last 10 days) testing not required  Diagnosis: AKI (acute kidney injury) [facility]) [[id number]]  Admitting Physician: Hannah Beat [[id number]]  Attending Physician: Hannah Beat [[id number]]  Certification:: I certify this patient will need inpatient services for at least 2 midnights  Estimated Length of Stay: 2          B Medical/Surgery History Past Medical History:  Diagnosis Date   Arthritis    CAD (coronary artery disease)    a. Cath 08/2009: minimal nonobstructive disease (25% LAD). b. Lexiscan nuc 6/215: EF 54%, no ischemia or infarction.   Chronic diastolic CHF (congestive heart failure) (HCC)    a. Echo 2013: EF 55-60%, mild AI, mild MR. b. Echo 5/15 with EF 60-65%, no WMA, mild LVH, mild AI.   CKD (chronic kidney disease) stage 3, GFR 30-59 ml/min (HCC) 07/26/2012   a. Felt likely due to HTN.   CVA (cerebral infarction) 07/28/2012   Glaucoma    Gout    H/O umbilical hernia repair     Headache(784.0)    " WHEN MY BLOOD PRESSURE IS UP"   History of acute renal failure    History of alcohol abuse    HTN (hypertension)    Hypertension    a. Patent renal arteries 2005.   Noncompliance    OA (osteoarthritis)    Other and unspecified hyperlipidemia    Poor circulation    Sinus bradycardia    SVT (supraventricular tachycardia)    a. Possible AVNRT.  Got adenosine from EMS in 2015 for SVT.  During 5/15 hospitalization had episode of short R-P tachycardia (possible AVNRT) terminated by adenosine.  b. 04/2016 - recurrent tachycardia (SVT vs. AVNRT), again terminated by Adenosine   Visual impairment    Past Surgical History:  Procedure Laterality Date   APPENDECTOMY     BIOPSY  08/12/2020   Procedure: BIOPSY;  Surgeon: Charna Elizabeth, MD;  Location: [facility] ENDOSCOPY;  Service: Endoscopy;;   BUBBLE STUDY  05/24/2019   Procedure: BUBBLE STUDY;  Surgeon: Little Ishikawa, MD;  Location: [facility] ENDOSCOPY;  Service: Endoscopy;;   CARDIAC CATHETERIZATION  11/2003   EF 50-55%   CARDIAC CATHETERIZATION  08/2009   CATARACT EXTRACTION EXTRACAPSULAR Right 05/17/2013   Procedure: CATARACT EXTRACTION EXTRACAPSULAR WITH INTRAOCULAR LENS PLACEMENT (IOC) RIGHT EYE;  Surgeon: Chalmers Guest, MD;  Location: [facility] OR;  Service: Ophthalmology;  Laterality: Right;   COLONOSCOPY     ESOPHAGOGASTRODUODENOSCOPY (EGD) WITH  PROPOFOL N/A 08/12/2020   Procedure: ESOPHAGOGASTRODUODENOSCOPY (EGD) WITH PROPOFOL;  Surgeon: Charna Elizabeth, MD;  Location: [facility] ENDOSCOPY;  Service: Endoscopy;  Laterality: N/A;   HERNIA REPAIR     INGUINAL HERNIA REPAIR Left 11/20/2022   Procedure: OPEN LEFT INGUINAL HERNIA REPAIR FOR GANGRENE AND SMALL BOWEL RESECTION WITH STINASTIMOSIST;  Surgeon: Kinsinger, De Blanch, MD;  Location: [facility] OR;  Service: General;  Laterality: Left;   IR PARACENTESIS  09/24/2022   LOOP RECORDER INSERTION N/A 05/24/2019   Procedure: LOOP RECORDER INSERTION;  Surgeon: Regan Lemming, MD;  Location: [facility]  INVASIVE CV LAB;  Service: Cardiovascular;  Laterality: N/A;   TEE WITHOUT CARDIOVERSION N/A 05/24/2019   Procedure: TRANSESOPHAGEAL ECHOCARDIOGRAM (TEE);  Surgeon: Little Ishikawa, MD;  Location: [facility] ENDOSCOPY;  Service: Endoscopy;  Laterality: N/A;   VENTRAL HERNIA REPAIR  03/07/2012   Procedure: LAPAROSCOPIC VENTRAL HERNIA;  Surgeon: Cherylynn Ridges, MD;  Location: [facility] OR;  Service: General;  Laterality: N/A;     A IV Location/Drains/Wounds Patient Lines/Drains/Airways Status     Active Line/Drains/Airways     Name Placement date Placement time Site Days   Peripheral IV 01/27/23 20 G Anterior;Right Antecubital 01/27/23  1757  Antecubital  less than 1   Peripheral IV 01/27/23 20 G Anterior;Left Forearm 01/27/23  2103  Forearm  less than 1            Intake/Output Last 24 hours  Intake/Output Summary (Last 24 hours) at 01/27/2023 2125 Last data filed at 01/27/2023 2125 Gross per 24 hour  Intake 2250 ml  Output --  Net 2250 ml    Labs/Imaging Results for orders placed or performed during the hospital encounter of 01/27/23 (from the past 48 hour(s))  Basic metabolic panel     Status: Abnormal   Collection Time: 01/27/23  5:49 PM  Result Value Ref Range   Sodium 128 (L) 135 - 145 mmol/L   Potassium 6.4 (HH) 3.5 - 5.1 mmol/L    Comment: CRITICAL RESULT CALLED TO, READ BACK BY AND VERIFIED WITH Veleta Miners, RN @ [phone] 01/27/23 BY SEKDAHL   Chloride 103 98 - 111 mmol/L   CO2 18 (L) 22 - 32 mmol/L   Glucose, Bld 188 (H) 70 - 99 mg/dL    Comment: Glucose reference range applies only to samples taken after fasting for at least 8 hours.   BUN 46 (H) 8 - 23 mg/dL   Creatinine, Ser 9.60 (H) 0.61 - 1.24 mg/dL   Calcium 7.7 (L) 8.9 - 10.3 mg/dL   GFR, Estimated 24 (L) >60 mL/min    Comment: (NOTE) Calculated using the CKD-EPI Creatinine Equation (2021)    Anion gap 7 5 - 15    Comment: Performed at [facility], [address] [place], [place] [address]  Brain natriuretic  peptide     Status: Abnormal   Collection Time: 01/27/23  5:49 PM  Result Value Ref Range   B Natriuretic Peptide 547.0 (H) 0.0 - 100.0 pg/mL    Comment: Performed at [facility], [address], [place], [place] [address]  CBC with Differential/Platelet     Status: Abnormal   Collection Time: 01/27/23  5:49 PM  Result Value Ref Range   WBC 4.5 4.0 - 10.5 K/uL   RBC 4.48 4.22 - 5.81 MIL/uL   Hemoglobin 12.3 (L) 13.0 - 17.0 g/dL   HCT 47.8 29.5 - 62.1 %   MCV 87.9 80.0 - 100.0 fL   MCH 27.5 26.0 -  34.0 pg   MCHC 31.2 30.0 - 36.0 g/dL   RDW 86.5 78.4 - 69.6 %   Platelets 217 150 - 400 K/uL   nRBC 0.0 0.0 - 0.2 %   Neutrophils Relative % 57 %   Neutro Abs 2.6 1.7 - 7.7 K/uL   Lymphocytes Relative 25 %   Lymphs Abs 1.1 0.7 - 4.0 K/uL   Monocytes Relative 8 %   Monocytes Absolute 0.4 0.1 - 1.0 K/uL   Eosinophils Relative 8 %   Eosinophils Absolute 0.4 0.0 - 0.5 K/uL   Basophils Relative 1 %   Basophils Absolute 0.0 0.0 - 0.1 K/uL   Immature Granulocytes 1 %   Abs Immature Granulocytes 0.03 0.00 - 0.07 K/uL    Comment: Performed at [facility], [address], [place], [place] [address]  Magnesium     Status: None   Collection Time: 01/27/23  5:49 PM  Result Value Ref Range   Magnesium 2.3 1.7 - 2.4 mg/dL    Comment: Performed at [facility], [address] [place], [place] [address]  Troponin I (High Sensitivity)     Status: None   Collection Time: 01/27/23  5:49 PM  Result Value Ref Range   Troponin I (High Sensitivity) 11 <18 ng/L    Comment: (NOTE) Elevated high sensitivity troponin I (hsTnI) values and significant  changes across serial measurements may suggest ACS but many other  chronic and acute conditions are known to elevate hsTnI results.  Refer to the "Links" section for chest pain algorithms and additional  guidance. Performed at [facility], [address], [place], [place] [address]   TSH     Status: None   Collection Time:  01/27/23  5:49 PM  Result Value Ref Range   TSH 4.320 0.350 - 4.500 uIU/mL    Comment: Performed by a 3rd Generation assay with a functional sensitivity of <=0.01 uIU/mL. Performed at [facility], [address] [place], [place] [address]    CT ABDOMEN PELVIS WO CONTRAST  Result Date: 01/27/2023 CLINICAL DATA:  Acute abdominal pain EXAM: CT ABDOMEN AND PELVIS WITHOUT CONTRAST TECHNIQUE: Multidetector CT imaging of the abdomen and pelvis was performed following the standard protocol without IV contrast. RADIATION DOSE REDUCTION: This exam was performed according to the departmental dose-optimization program which includes automated exposure control, adjustment of the mA and/or kV according to patient size and/or use of iterative reconstruction technique. COMPARISON:  11/22/2022 FINDINGS: Lower chest: Mild atelectatic changes are noted bilaterally. Hepatobiliary: Gallbladder is well distended. Liver appears within normal limits. Pancreas: Unremarkable. No pancreatic ductal dilatation or surrounding inflammatory changes. Spleen: Normal in size without focal abnormality. Adrenals/Urinary Tract: Adrenal glands are within normal limits. Kidneys demonstrate renal cystic change which appears simple in nature. No follow-up is recommended. No renal calculi or obstructive changes are seen. The bladder is decompressed. Stomach/Bowel: No obstructive or inflammatory changes of the colon are seen. Appendix is within normal limits. Contrast is seen within the stomach and proximal small bowel. No inflammatory changes are seen. Vascular/Lymphatic: Aortic atherosclerosis. No enlarged abdominal or pelvic lymph nodes. Reproductive: Prostate is unremarkable. Other: No abdominal wall hernia or abnormality. No abdominopelvic ascites. Musculoskeletal: Multilevel degenerative changes are noted without acute abnormality. IMPRESSION: No acute abnormality is noted to correspond with the given clinical history. Electronically  Signed   By: Alcide Clever M.D.   On: 01/27/2023 20:53   DG Chest Portable 1 View  Result Date: 01/27/2023 CLINICAL  DATA:  dyspnea EXAM: PORTABLE CHEST 1 VIEW COMPARISON:  CXR 09/23/22 FINDINGS: Left-sided loop recorder device in place. There is a hazy opacity at the left lung base could represent atelectasis or infection. No radiographically apparent displaced rib fractures. Normal cardiac and mediastinal contours. Visualized upper abdomen is notable for gas distended loops of bowel. There is subdiaphragmatic lucency that could represent air trapped in a gas distended stomach, but pneumoperitoneum is not excluded. Recommend further evaluation with a dedicated abdominal radiograph. IMPRESSION: 1. Hazy opacity at the left lung base could represent atelectasis or infection. 2. Subdiaphragmatic lucency could represent air trapped in a gas distended stomach, but pneumoperitoneum is not excluded. Recommend further evaluation with a dedicated abdominal radiograph. Electronically Signed   By: Lorenza Cambridge M.D.   On: 01/27/2023 18:39    Pending Labs Unresulted Labs (From admission, onward)    None       Vitals/Pain Today's Vitals   01/27/23 1855 01/27/23 1900 01/27/23 2015 01/27/23 2115  BP:  128/62 133/64 (!) 153/69  Pulse: (!) 42 (!) 51 (!) 50 (!) 51  Resp: 13 16 18 16   Temp:    98 F (36.7 C)  TempSrc:      SpO2: 100% 100% 100% 100%  Weight:      Height:      PainSc:        Isolation Precautions No active isolations  Medications Medications  sodium zirconium cyclosilicate (LOKELMA) packet 10 g (10 g Oral Given 01/27/23 1941)  atropine 1 MG/10ML injection 0.5 mg (0.5 mg Intravenous Given 01/27/23 1825)  sodium chloride 0.9 % bolus 1,000 mL (0 mLs Intravenous Stopped 01/27/23 1941)  vancomycin (VANCOCIN) IVPB 1000 mg/200 mL premix (0 mg Intravenous Stopped 01/27/23 2044)  piperacillin-tazobactam (ZOSYN) IVPB 3.375 g (0 g Intravenous Stopped 01/27/23 2017)  calcium gluconate inj 10% (1 g)  URGENT USE ONLY! (1 g Intravenous Given 01/27/23 1941)  insulin aspart (novoLOG) injection 5 Units (5 Units Intravenous Given 01/27/23 1941)    And  dextrose 50 % solution 50 mL (50 mLs Intravenous Given 01/27/23 1941)  sodium chloride 0.9 % bolus 1,000 mL (0 mLs Intravenous Stopped 01/27/23 2125)    Mobility non-ambulatory     R Recommendations: See Admitting Provider Note  Report given to:   Additional Notes: increasing fatigue and brady last 3 days, episode unresponsive in triage, HR 20-30s on monitor, given atropine 1830, responded well to it, blind at baseline, HR now 40-50s, received 2 liters NS, vanc, zosyn, K 6.4, treated per protocol, son at bedside, family made DNR

## 2023-01-27 NOTE — Assessment & Plan Note (Signed)
-   We will continue his Proscar. ?

## 2023-01-27 NOTE — Assessment & Plan Note (Addendum)
-   This is clearly secondary to #1. - This was aggressively managed and potassium will be followed.

## 2023-01-28 ENCOUNTER — Inpatient Hospital Stay (HOSPITAL_COMMUNITY): Payer: 59

## 2023-01-28 DIAGNOSIS — R55 Syncope and collapse: Secondary | ICD-10-CM

## 2023-01-28 DIAGNOSIS — N179 Acute kidney failure, unspecified: Secondary | ICD-10-CM | POA: Diagnosis not present

## 2023-01-28 DIAGNOSIS — R001 Bradycardia, unspecified: Secondary | ICD-10-CM

## 2023-01-28 LAB — BASIC METABOLIC PANEL
Anion gap: 6 (ref 5–15)
BUN: 43 mg/dL — ABNORMAL HIGH (ref 8–23)
CO2: 17 mmol/L — ABNORMAL LOW (ref 22–32)
Calcium: 7.5 mg/dL — ABNORMAL LOW (ref 8.9–10.3)
Chloride: 110 mmol/L (ref 98–111)
Creatinine, Ser: 2.2 mg/dL — ABNORMAL HIGH (ref 0.61–1.24)
GFR, Estimated: 29 mL/min — ABNORMAL LOW (ref >60)
Glucose, Bld: 103 mg/dL — ABNORMAL HIGH (ref 70–99)
Potassium: 5.7 mmol/L — ABNORMAL HIGH (ref 3.5–5.1)
Sodium: 133 mmol/L — ABNORMAL LOW (ref 135–145)

## 2023-01-28 LAB — ECHOCARDIOGRAM COMPLETE
AR max vel: 2.58 cm2
AV Peak grad: 7.9 mmHg
Ao pk vel: 1.41 m/s
Area-P 1/2: 3.34 cm2
Height: 70 in
MV M vel: 5.21 m/s
MV Peak grad: 108.6 mmHg
S' Lateral: 2.4 cm
Weight: 1840 oz

## 2023-01-28 LAB — CBC
HCT: 31 % — ABNORMAL LOW (ref 39.0–52.0)
Hemoglobin: 9.7 g/dL — ABNORMAL LOW (ref 13.0–17.0)
MCH: 27.3 pg (ref 26.0–34.0)
MCHC: 31.3 g/dL (ref 30.0–36.0)
MCV: 87.3 fL (ref 80.0–100.0)
Platelets: 113 10*3/uL — ABNORMAL LOW (ref 150–400)
RBC: 3.55 MIL/uL — ABNORMAL LOW (ref 4.22–5.81)
RDW: 15.1 % (ref 11.5–15.5)
WBC: 4.2 10*3/uL (ref 4.0–10.5)
nRBC: 0 % (ref 0.0–0.2)

## 2023-01-28 LAB — TROPONIN I (HIGH SENSITIVITY): Troponin I (High Sensitivity): 9 ng/L (ref <18)

## 2023-01-28 MED ORDER — SODIUM BICARBONATE 650 MG PO TABS
650.0000 mg | ORAL_TABLET | Freq: Two times a day (BID) | ORAL | Status: DC
  Administered 2023-01-28 – 2023-02-02 (×11): 650 mg via ORAL
  Filled 2023-01-28 (×11): qty 1

## 2023-01-28 NOTE — Progress Notes (Addendum)
Progress Note  Patient Name: Trevor Murphy Date of Encounter: 01/28/2023  Primary Cardiologist: Orbie Pyo, MD  Subjective   Feeling well, no complaints. No CP or SOB. Not dizzy. He thought he had a defibrillator. Upon exam and chart review this is an ILR.  Inpatient Medications    Scheduled Meds:  brinzolamide  1 drop Both Eyes TID   enoxaparin (LOVENOX) injection  30 mg Subcutaneous Q24H   ferrous sulfate  325 mg Oral Q breakfast   finasteride  5 mg Oral Daily   latanoprost  1 drop Both Eyes QHS   sodium bicarbonate  650 mg Oral BID   sodium zirconium cyclosilicate  10 g Oral Daily   Continuous Infusions:  sodium chloride 100 mL/hr at 01/28/23 0515   PRN Meds: acetaminophen **OR** acetaminophen, lactulose, magnesium hydroxide, ondansetron **OR** ondansetron (ZOFRAN) IV, traMADol, traZODone   Vital Signs    Vitals:   01/28/23 0142 01/28/23 0514 01/28/23 0937 01/28/23 1021  BP: (!) 144/55 (!) 111/55 122/62   Pulse: (!) 43 79 68 (!) 56  Resp: 18 18 18    Temp: (!) 96.2 F (35.7 C) (!) 96.4 F (35.8 C) (!) 97.3 F (36.3 C)   TempSrc:   Oral   SpO2: 99% 98% 91% 100%  Weight:      Height:        Intake/Output Summary (Last 24 hours) at 01/28/2023 1028 Last data filed at 01/28/2023 0900 Gross per 24 hour  Intake 3195.41 ml  Output 1200 ml  Net 1995.41 ml      01/27/2023    5:41 PM 12/08/2022    3:29 PM 11/27/2022    4:43 AM  Last 3 Weights  Weight (lbs) 115 lb 137 lb 151 lb 10.8 oz  Weight (kg) 52.164 kg 62.143 kg 68.8 kg     Telemetry    Previous bradycardia reviewed, less burden from around 9am onward, predominantly NSR 50s-60s with occ PACs/PVCs now - Personally =  Physical Exam   GEN: No acute distress.  HEENT: Normocephalic, atraumatic, sclera non-icteric. Neck: No JVD or bruits. Cardiac: RRR occasional ectopy no murmurs, rubs, or gallops. ILR in place Respiratory: Clear to auscultation bilaterally. Breathing is unlabored. GI: Soft, nontender,  non-distended, BS +x 4. MS: no deformity. Extremities: No clubbing or cyanosis. No edema. Distal pedal pulses are 2+ and equal bilaterally. Neuro:  AAOx3. Follows commands. Psych:  Responds to questions appropriately with a normal affect.  Labs    High Sensitivity Troponin:   Recent Labs  Lab 01/27/23 1749 01/27/23 2102 01/27/23 2302  TROPONINIHS 11 8 9       Cardiac EnzymesNo results for input(s): "TROPONINI" in the last 168 hours. No results for input(s): "TROPIPOC" in the last 168 hours.   Chemistry Recent Labs  Lab 01/27/23 1749 01/28/23 0449  NA 128* 133*  K 6.4* 5.7*  CL 103 110  CO2 18* 17*  GLUCOSE 188* 103*  BUN 46* 43*  CREATININE 2.56* 2.20*  CALCIUM 7.7* 7.5*  GFRNONAA 24* 29*  ANIONGAP 7 6     Hematology Recent Labs  Lab 01/27/23 1749 01/28/23 0449  WBC 4.5 4.2  RBC 4.48 3.55*  HGB 12.3* 9.7*  HCT 39.4 31.0*  MCV 87.9 87.3  MCH 27.5 27.3  MCHC 31.2 31.3  RDW 15.3 15.1  PLT 217 113*    BNP Recent Labs  Lab 01/27/23 1749  BNP 547.0*     DDimer No results for input(s): "DDIMER" in the last 168 hours.  Radiology    ECHOCARDIOGRAM COMPLETE  Result Date: 01/28/2023    ECHOCARDIOGRAM REPORT   Patient Name:   Trevor Murphy Date of Exam: 01/28/2023 Medical Rec #:  [id number]    Height:       70.0 in Accession #:    [id number]   Weight:       115.0 lb Date of Birth:  11/03/1934    BSA:          1.650 m Patient Age:    88 years     BP:           111/55 mmHg Patient Gender: M            HR:           56 bpm. Exam Location:  Inpatient Procedure: 2D Echo, Cardiac Doppler and Color Doppler Indications:    Syncope R55  History:        Patient has prior history of Echocardiogram examinations, most                 recent 09/24/2022. CHF, CAD, Pacemaker, Stroke, Arrythmias:Atrial                 Fibrillation, Bradycardia and Tachycardia,                 Signs/Symptoms:Syncope; Risk Factors:Dyslipidemia. CKD, stage 3.  Sonographer:    [name] Referring  Phys: [id number] JAN A MANSY IMPRESSIONS  1. Left ventricular ejection fraction, by estimation, is 60 to 65%. The left ventricle has normal function. The left ventricle has no regional wall motion abnormalities. There is mild left ventricular hypertrophy. Left ventricular diastolic parameters are indeterminate.  2. Right ventricular systolic function is normal. The right ventricular size is normal. There is mildly elevated pulmonary artery systolic pressure.  3. Left atrial size was moderately dilated.  4. The mitral valve is grossly normal. Mild mitral valve regurgitation.  5. AI jet directed toward the anterior mitral valve leaflet. The aortic valve is normal in structure. Aortic valve regurgitation is moderate.  6. The inferior vena cava is normal in size with greater than 50% respiratory variability, suggesting right atrial pressure of 3 mmHg. Comparison(s): No significant change from prior study. FINDINGS  Left Ventricle: Left ventricular ejection fraction, by estimation, is 60 to 65%. The left ventricle has normal function. The left ventricle has no regional wall motion abnormalities. The left ventricular internal cavity size was normal in size. There is  mild left ventricular hypertrophy. Left ventricular diastolic parameters are indeterminate. Right Ventricle: The right ventricular size is normal. Right ventricular systolic function is normal. There is mildly elevated pulmonary artery systolic pressure. The tricuspid regurgitant velocity is 3.03 m/s, and with an assumed right atrial pressure of 3 mmHg, the estimated right ventricular systolic pressure is 39.7 mmHg. Left Atrium: Left atrial size was moderately dilated. Right Atrium: Right atrial size was normal in size. Pericardium: There is no evidence of pericardial effusion. Mitral Valve: The mitral valve is grossly normal. Mild mitral valve regurgitation. Tricuspid Valve: Tricuspid valve regurgitation is mild. Aortic Valve: AI jet directed toward the  anterior mitral valve leaflet. The aortic valve is normal in structure. Aortic valve regurgitation is moderate. Aortic valve peak gradient measures 7.9 mmHg. Pulmonic Valve: Pulmonic valve regurgitation is trivial. Aorta: The aortic root and ascending aorta are structurally normal, with no evidence of dilitation. Venous: The inferior vena cava is normal in size with greater than 50% respiratory variability, suggesting right atrial pressure of 3  mmHg. IAS/Shunts: The interatrial septum was not well visualized.  LEFT VENTRICLE PLAX 2D LVIDd:         4.40 cm   Diastology LVIDs:         2.40 cm   LV e' lateral:   14.50 cm/s LV PW:         1.10 cm   LV E/e' lateral: 2.7 LV IVS:        1.20 cm LVOT diam:     2.20 cm LV SV:         82 LV SV Index:   50 LVOT Area:     3.80 cm  RIGHT VENTRICLE             IVC RV S prime:     17.60 cm/s  IVC diam: 1.40 cm TAPSE (M-mode): 3.1 cm LEFT ATRIUM             Index        RIGHT ATRIUM           Index LA diam:        3.60 cm 2.18 cm/m   RA Area:     13.90 cm LA Vol (A2C):   43.3 ml 26.24 ml/m  RA Volume:   27.90 ml  16.91 ml/m LA Vol (A4C):   53.8 ml 32.61 ml/m LA Biplane Vol: 51.8 ml 31.40 ml/m  AORTIC VALVE                 PULMONIC VALVE AV Area (Vmax): 2.58 cm     PR End Diast Vel: 6.35 msec AV Vmax:        140.50 cm/s AV Peak Grad:   7.9 mmHg LVOT Vmax:      95.43 cm/s LVOT Vmean:     56.700 cm/s LVOT VTI:       0.215 m  AORTA Ao Root diam: 3.70 cm Ao Asc diam:  3.60 cm MITRAL VALVE               TRICUSPID VALVE MV Area (PHT): 3.34 cm    TR Peak grad:   36.7 mmHg MV Decel Time: 227 msec    TR Vmax:        303.00 cm/s MR Peak grad: 108.6 mmHg MR Vmax:      521.00 cm/s  SHUNTS MV E velocity: 38.63 cm/s  Systemic VTI:  0.22 m MV A velocity: 66.70 cm/s  Systemic Diam: 2.20 cm MV E/A ratio:  0.58 Photographer signed by Carolan Clines Signature Date/Time: 01/28/2023/9:41:25 AM    Final    CT ABDOMEN PELVIS WO CONTRAST  Result Date: 01/27/2023 CLINICAL DATA:  Acute  abdominal pain EXAM: CT ABDOMEN AND PELVIS WITHOUT CONTRAST TECHNIQUE: Multidetector CT imaging of the abdomen and pelvis was performed following the standard protocol without IV contrast. RADIATION DOSE REDUCTION: This exam was performed according to the departmental dose-optimization program which includes automated exposure control, adjustment of the mA and/or kV according to patient size and/or use of iterative reconstruction technique. COMPARISON:  11/22/2022 FINDINGS: Lower chest: Mild atelectatic changes are noted bilaterally. Hepatobiliary: Gallbladder is well distended. Liver appears within normal limits. Pancreas: Unremarkable. No pancreatic ductal dilatation or surrounding inflammatory changes. Spleen: Normal in size without focal abnormality. Adrenals/Urinary Tract: Adrenal glands are within normal limits. Kidneys demonstrate renal cystic change which appears simple in nature. No follow-up is recommended. No renal calculi or obstructive changes are seen. The bladder is decompressed. Stomach/Bowel: No obstructive or inflammatory changes of the colon  are seen. Appendix is within normal limits. Contrast is seen within the stomach and proximal small bowel. No inflammatory changes are seen. Vascular/Lymphatic: Aortic atherosclerosis. No enlarged abdominal or pelvic lymph nodes. Reproductive: Prostate is unremarkable. Other: No abdominal wall hernia or abnormality. No abdominopelvic ascites. Musculoskeletal: Multilevel degenerative changes are noted without acute abnormality. IMPRESSION: No acute abnormality is noted to correspond with the given clinical history. Electronically Signed   By: Alcide Clever M.D.   On: 01/27/2023 20:53   DG Chest Portable 1 View  Result Date: 01/27/2023 CLINICAL DATA:  dyspnea EXAM: PORTABLE CHEST 1 VIEW COMPARISON:  CXR 09/23/22 FINDINGS: Left-sided loop recorder device in place. There is a hazy opacity at the left lung base could represent atelectasis or infection. No  radiographically apparent displaced rib fractures. Normal cardiac and mediastinal contours. Visualized upper abdomen is notable for gas distended loops of bowel. There is subdiaphragmatic lucency that could represent air trapped in a gas distended stomach, but pneumoperitoneum is not excluded. Recommend further evaluation with a dedicated abdominal radiograph. IMPRESSION: 1. Hazy opacity at the left lung base could represent atelectasis or infection. 2. Subdiaphragmatic lucency could represent air trapped in a gas distended stomach, but pneumoperitoneum is not excluded. Recommend further evaluation with a dedicated abdominal radiograph. Electronically Signed   By: Lorenza Cambridge M.D.   On: 01/27/2023 18:39    Cardiac Studies   2D echo 01/28/23  1. Left ventricular ejection fraction, by estimation, is 60 to 65%. The  left ventricle has normal function. The left ventricle has no regional  wall motion abnormalities. There is mild left ventricular hypertrophy.  Left ventricular diastolic parameters  are indeterminate.   2. Right ventricular systolic function is normal. The right ventricular  size is normal. There is mildly elevated pulmonary artery systolic  pressure.   3. Left atrial size was moderately dilated.   4. The mitral valve is grossly normal. Mild mitral valve regurgitation.   5. AI jet directed toward the anterior mitral valve leaflet. The aortic  valve is normal in structure. Aortic valve regurgitation is moderate.   6. The inferior vena cava is normal in size with greater than 50%  respiratory variability, suggesting right atrial pressure of 3 mmHg.   Comparison(s): No significant change from prior study.    Patient Profile     88 y.o. male with mild coronary artery disease by cath 2011, chronic diastolic heart failure, paroxysmal atrial fibrillation, ILR in place with prior bradycardia by monitoring 10/2022, ETOH abuse, cirrhosis, hypertension, CKD 3a, PSVT, stroke (has PFO but not  felt to be PFO associated), aortic atherosclerosis. Presented with fatigue, inability to tolerate food, with syncope in ED waiting room. Found to have bradycardia with HR as low as the 20s in context of hyperkalemia with AKI.  Assessment & Plan    1. Symptomatic bradycardia with LOC in ED - suspect driven by metabolic derangement with hyperkalemia in context of AKI - recommend correction of lytes by primary team - conduction appears slightly improved this morning, less bradycardia from 9am onward, no recurrent prolonged pauses since around 1am - will ask for ILR interrogation though it does look like he has had known bradycardia in the past by ILR interrogation 10/2022 as well, recommended to continue to follow by EP team - continue to hold carvedilol - no urgent indication for PPM but will review overall plan with MD  2. H/o PAF  - per note review, Eliquis discontinued by medicine team in 09/2022 with worsening liver decompensation  and severe thrombocytopenia, had been discussed with GI at that time, also admitted 11/2022 with ABL anemia with Hgb down to 5 range in setting of diverticular bleed (OFF anticoagulation) therefore likely poor OAC candidate going forward as well - holding carvedilol as above  3. Chronic HFpEF - intravascularly depleted on exam with AKI - avoid diuretics including spiro, Lasix, SGLT2i  4. Mild MR/moderate AI by echo - follow clinically  5. Mild CAD - denies angina  For questions or updates, please contact [facility] Please consult [email or web address] for contact info under Cardiology/STEMI.  Signed, Laurann Montana, PA-C 01/28/2023, 10:28 AM    Personally seen and examined. Agree with above.  Has implantable loop recorder - will interrogate. ECG with afib slow conduction on arrival, 28 bpm.  Stopped beta blocker, coreg.  No anticoagulation due to high risk and prior anemia Bradycardia has improved with improvement of metabolic derangement. K 6.4 to  5.7   Donato Schultz, MD

## 2023-01-28 NOTE — Progress Notes (Signed)
Echocardiogram 2D Echocardiogram has been performed.  [name] 01/28/2023, 9:08 AM

## 2023-01-28 NOTE — Progress Notes (Signed)
Received device interrogation report from rep. Since inception of ILR has had 606 brady events. Had similar events in March, April, and now June. (Per rep, he did the same thing as this admission in April as well). Looks like carvedilol was initiated 6.25mg  BID in 11/2022. D/w Dr. Anne Fu, continue plan as outlined in note today. Keep off carvedilol. Correction of electrolytes. Would benefit from OP EP f/u as outpatient if rhythm remains stable post-BB washout.  Trevor Murphy

## 2023-01-28 NOTE — Consult Note (Signed)
Cardiology Consultation:   Patient ID: Trevor Murphy MRN: [id number]; DOB: 02-Nov-1934  Admit date: 01/27/2023 Date of Consult: 01/28/2023  Primary Care Provider: [name], FNP Primary Cardiologist: Orbie Pyo, MD  Primary Electrophysiologist:  None    Patient Profile:   Trevor Murphy is a 88 y.o. male with a hx of coronary artery disease, chronic diastolic heart failure, paroxysmal atrial fibrillation, cirrhosis, hypertension, CKD who is being seen today for the evaluation of bradycardia at the request of hospital medicine.  History of Present Illness:   Trevor Murphy is a 88 y.o. male who has the above medical problems who initially presented to the emergency department with several days of fatigue and tiredness along with inability to tolerate food.  He also had a brief episode of loss of consciousness which happened while he was in the waiting room in the emergency department.  During that episode and afterwards he was bradycardic with heart rates as low as the 20s.  He was given atropine and his heart rate recovered and he was asymptomatic.  On arrival to the emergency department blood pressure was 104/48 and heart rate was in the 40s.  Labs revealed that he was having acute kidney injury on the background of his CKD with electrolyte derangement.  Cardiology was consulted for recommendations on his bradycardic episodes     Past Medical History:  Diagnosis Date   Arthritis    CAD (coronary artery disease)    a. Cath 08/2009: minimal nonobstructive disease (25% LAD). b. Lexiscan nuc 6/215: EF 54%, no ischemia or infarction.   Chronic diastolic CHF (congestive heart failure) (HCC)    a. Echo 2013: EF 55-60%, mild AI, mild MR. b. Echo 5/15 with EF 60-65%, no WMA, mild LVH, mild AI.   CKD (chronic kidney disease) stage 3, GFR 30-59 ml/min (HCC) 07/26/2012   a. Felt likely due to HTN.   CVA (cerebral infarction) 07/28/2012   Glaucoma    Gout    H/O umbilical hernia repair     Headache(784.0)    " WHEN MY BLOOD PRESSURE IS UP"   History of acute renal failure    History of alcohol abuse    HTN (hypertension)    Hypertension    a. Patent renal arteries 2005.   Noncompliance    OA (osteoarthritis)    Other and unspecified hyperlipidemia    Poor circulation    Sinus bradycardia    SVT (supraventricular tachycardia)    a. Possible AVNRT.  Got adenosine from EMS in 2015 for SVT.  During 5/15 hospitalization had episode of short R-P tachycardia (possible AVNRT) terminated by adenosine.  b. 04/2016 - recurrent tachycardia (SVT vs. AVNRT), again terminated by Adenosine   Visual impairment      Past Surgical History:  Procedure Laterality Date   APPENDECTOMY     BIOPSY  08/12/2020   Procedure: BIOPSY;  Surgeon: Charna Elizabeth, MD;  Location: [facility] ENDOSCOPY;  Service: Endoscopy;;   BUBBLE STUDY  05/24/2019   Procedure: BUBBLE STUDY;  Surgeon: Little Ishikawa, MD;  Location: [facility] ENDOSCOPY;  Service: Endoscopy;;   CARDIAC CATHETERIZATION  11/2003   EF 50-55%   CARDIAC CATHETERIZATION  08/2009   CATARACT EXTRACTION EXTRACAPSULAR Right 05/17/2013   Procedure: CATARACT EXTRACTION EXTRACAPSULAR WITH INTRAOCULAR LENS PLACEMENT (IOC) RIGHT EYE;  Surgeon: Chalmers Guest, MD;  Location: [facility] OR;  Service: Ophthalmology;  Laterality: Right;   COLONOSCOPY     ESOPHAGOGASTRODUODENOSCOPY (EGD) WITH PROPOFOL N/A 08/12/2020   Procedure: ESOPHAGOGASTRODUODENOSCOPY (EGD) WITH  PROPOFOL;  Surgeon: Charna Elizabeth, MD;  Location: [facility] ENDOSCOPY;  Service: Endoscopy;  Laterality: N/A;   HERNIA REPAIR     INGUINAL HERNIA REPAIR Left 11/20/2022   Procedure: OPEN LEFT INGUINAL HERNIA REPAIR FOR GANGRENE AND SMALL BOWEL RESECTION WITH STINASTIMOSIST;  Surgeon: Kinsinger, De Blanch, MD;  Location: [facility] OR;  Service: General;  Laterality: Left;   IR PARACENTESIS  09/24/2022   LOOP RECORDER INSERTION N/A 05/24/2019   Procedure: LOOP RECORDER INSERTION;  Surgeon: Regan Lemming, MD;  Location: [facility]  INVASIVE CV LAB;  Service: Cardiovascular;  Laterality: N/A;   TEE WITHOUT CARDIOVERSION N/A 05/24/2019   Procedure: TRANSESOPHAGEAL ECHOCARDIOGRAM (TEE);  Surgeon: Little Ishikawa, MD;  Location[facility] ENDOSCOPY;  Service: Endoscopy;  Laterality: N/A;   VENTRAL HERNIA REPAIR  03/07/2012   Procedure: LAPAROSCOPIC VENTRAL HERNIA;  Surgeon: Cherylynn Ridges, MD;  Location: [facility] OR;  Service: General;  Laterality: N/A;     Home Medications:  Prior to Admission medications   Medication Sig Start Date End Date Taking? Authorizing Provider  acetaminophen (TYLENOL) 500 MG tablet Take 1,000 mg by mouth 2 (two) times daily as needed for mild pain or moderate pain (leg pain).   Yes [provider]  carvedilol (COREG) 6.25 MG tablet Take 1 tablet (6.25 mg total) by mouth 2 (two) times daily with a meal. 11/27/22  Yes Almon Hercules, MD  dapagliflozin propanediol ([name]) 10 MG TABS tablet Take 1 tablet (10 mg total) by mouth daily before breakfast. 11/18/22  Yes [name], FNP  ferrous sulfate (FEROSUL) 325 (65 FE) MG tablet TAKE 1 TABLET(325 MG) BY MOUTH DAILY Patient taking differently: Take 325 mg by mouth daily. 11/09/22  Yes [name], FNP  finasteride (PROSCAR) 5 MG tablet Take 5 mg by mouth daily. 03/09/22  Yes [provider]  furosemide (LASIX) 20 MG tablet Take 1 tablet (20 mg total) by mouth daily. 11/27/22  Yes Gonfa, Taye T, MD  LUMIGAN 0.01 % SOLN Place 1 drop into both eyes at bedtime. 08/28/19  Yes [provider]  SIMBRINZA 1-0.2 % SUSP Follow up with your PCP on when to restart this medication. Patient taking differently: Place 1 drop into both eyes 3 (three) times daily. 06/09/22  Yes Narda Bonds, MD  spironolactone (ALDACTONE) 50 MG tablet Take 1 tablet (50 mg total) by mouth daily. 09/29/22  Yes Lewie Chamber, MD  lactulose (CHRONULAC) 10 GM/15ML solution Take 15 mLs (10 g total) by mouth 3 (three) times daily as needed for mild constipation. Patient  not taking: Reported on 01/27/2023 11/27/22   Almon Hercules, MD  traMADol (ULTRAM) 50 MG tablet Take 1 tablet (50 mg total) by mouth every 6 (six) hours as needed for moderate pain. Patient not taking: Reported on 01/27/2023 11/27/22   Barnetta Chapel, PA-C    Inpatient Medications: Scheduled Meds:  brinzolamide  1 drop Both Eyes TID   enoxaparin (LOVENOX) injection  30 mg Subcutaneous Q24H   ferrous sulfate  325 mg Oral Q breakfast   finasteride  5 mg Oral Daily   latanoprost  1 drop Both Eyes QHS   sodium zirconium cyclosilicate  10 g Oral Daily   Continuous Infusions:  sodium chloride 100 mL/hr at 01/27/23 2241   PRN Meds: acetaminophen **OR** acetaminophen, lactulose, magnesium hydroxide, ondansetron **OR** ondansetron (ZOFRAN) IV, traMADol, traZODone  Allergies:   No Known Allergies  Social History:   Social History   Socioeconomic History   Marital status: Married    Spouse  name: Not on file   Number of children: Not on file   Years of education: Not on file   Highest education level: Not on file  Occupational History   Occupation: retired  Tobacco Use   Smoking status: Never   Smokeless tobacco: Never  Vaping Use   Vaping Use: Never used  Substance and Sexual Activity   Alcohol use: No    Alcohol/week: 1.0 standard drink of alcohol    Types: 1 Cans of beer per week    Comment: h/o heavy use, quit in 2017   Drug use: No   Sexual activity: Not Currently  Other Topics Concern   Not on file  Social History Narrative   Not on file   Social Determinants of Health   Financial Resource Strain: Low Risk  (10/21/2022)   Overall Financial Resource Strain (CARDIA)    Difficulty of Paying Living Expenses: Not hard at all  Food Insecurity: No Food Insecurity (11/30/2022)   Hunger Vital Sign    Worried About Running Out of Food in the Last Year: Never true    Ran Out of Food in the Last Year: Never true  Transportation Needs: No Transportation Needs (11/30/2022)   PRAPARE  - Administrator, Civil Service (Medical): No    Lack of Transportation (Non-Medical): No  Physical Activity: Inactive (10/21/2022)   Exercise Vital Sign    Days of Exercise per Week: 0 days    Minutes of Exercise per Session: 0 min  Stress: No Stress Concern Present (10/21/2022)   [employer] of Occupational Health - Occupational Stress Questionnaire    Feeling of Stress : Not at all  Social Connections: Moderately Isolated (04/15/2021)   Social Connection and Isolation Panel [NHANES]    Frequency of Communication with Friends and Family: More than three times a week    Frequency of Social Gatherings with Friends and Family: More than three times a week    Attends Religious Services: More than 4 times per year    Active Member of [employer] or Organizations: No    Attends Banker Meetings: Never    Marital Status: Widowed  Intimate Partner Violence: Not At Risk (11/20/2022)   Humiliation, Afraid, Rape, and Kick questionnaire    Fear of Current or Ex-Partner: No    Emotionally Abused: No    Physically Abused: No    Sexually Abused: No    Family History:    Family History  Problem Relation Age of Onset   CVA Mother    Hypertension Brother    Hypertension Sister    Hypertension Brother    Stroke Sister    Heart attack Neg Hx      Review of Systems: All other review of systems are negative unless otherwise noted in the HPI as above.   Physical Exam/Data:   Vitals:   01/27/23 2145 01/27/23 2317 01/28/23 0002 01/28/23 0142  BP: 132/62 (!) 116/48 129/62 (!) 144/55  Pulse: (!) 49 60  (!) 43  Resp: 17 16 16 18   Temp:  (!) 96 F (35.6 C)  (!) 96.2 F (35.7 C)  TempSrc:      SpO2: 100% 97% 97% 99%  Weight:      Height:        Intake/Output Summary (Last 24 hours) at 01/28/2023 0149 Last data filed at 01/27/2023 2125 Gross per 24 hour  Intake 2250 ml  Output --  Net 2250 ml   Filed Weights   01/27/23 1741  Weight: 52.2 kg   Body mass index is  16.5 kg/m.  General appearance: alert, cooperative, appears stated age, and no distress Neck: no JVD Lungs: clear to auscultation bilaterally Heart: irregularly irregular rhythm Abdomen: soft, non-tender; bowel sounds normal; no masses,  no organomegaly Extremities: extremities normal, atraumatic, no cyanosis or edema Pulses: 2+ and symmetric Skin: Skin color, texture, turgor normal. No rashes or lesions Neurologic: Grossly normal  EKG:  The EKG was personally reviewed and demonstrates: Atrial fibrillation with slow ventricular response Telemetry:  Telemetry was personally reviewed and demonstrates: A-fib and bradycardia  Relevant CV Studies: None  Laboratory Data:  Chemistry Recent Labs  Lab 01/27/23 1749  NA 128*  K 6.4*  CL 103  CO2 18*  GLUCOSE 188*  BUN 46*  CREATININE 2.56*  CALCIUM 7.7*  GFRNONAA 24*  ANIONGAP 7    No results for input(s): "PROT", "ALBUMIN", "AST", "ALT", "ALKPHOS", "BILITOT" in the last 168 hours. Hematology Recent Labs  Lab 01/27/23 1749  WBC 4.5  RBC 4.48  HGB 12.3*  HCT 39.4  MCV 87.9  MCH 27.5  MCHC 31.2  RDW 15.3  PLT 217   Cardiac EnzymesNo results for input(s): "TROPONINI" in the last 168 hours. No results for input(s): "TROPIPOC" in the last 168 hours.  BNP Recent Labs  Lab 01/27/23 1749  BNP 547.0*    DDimer No results for input(s): "DDIMER" in the last 168 hours.  Radiology/Studies:  CT ABDOMEN PELVIS WO CONTRAST  Result Date: 01/27/2023 CLINICAL DATA:  Acute abdominal pain EXAM: CT ABDOMEN AND PELVIS WITHOUT CONTRAST TECHNIQUE: Multidetector CT imaging of the abdomen and pelvis was performed following the standard protocol without IV contrast. RADIATION DOSE REDUCTION: This exam was performed according to the departmental dose-optimization program which includes automated exposure control, adjustment of the mA and/or kV according to patient size and/or use of iterative reconstruction technique. COMPARISON:  11/22/2022  FINDINGS: Lower chest: Mild atelectatic changes are noted bilaterally. Hepatobiliary: Gallbladder is well distended. Liver appears within normal limits. Pancreas: Unremarkable. No pancreatic ductal dilatation or surrounding inflammatory changes. Spleen: Normal in size without focal abnormality. Adrenals/Urinary Tract: Adrenal glands are within normal limits. Kidneys demonstrate renal cystic change which appears simple in nature. No follow-up is recommended. No renal calculi or obstructive changes are seen. The bladder is decompressed. Stomach/Bowel: No obstructive or inflammatory changes of the colon are seen. Appendix is within normal limits. Contrast is seen within the stomach and proximal small bowel. No inflammatory changes are seen. Vascular/Lymphatic: Aortic atherosclerosis. No enlarged abdominal or pelvic lymph nodes. Reproductive: Prostate is unremarkable. Other: No abdominal wall hernia or abnormality. No abdominopelvic ascites. Musculoskeletal: Multilevel degenerative changes are noted without acute abnormality. IMPRESSION: No acute abnormality is noted to correspond with the given clinical history. Electronically Signed   By: Alcide Clever M.D.   On: 01/27/2023 20:53   DG Chest Portable 1 View  Result Date: 01/27/2023 CLINICAL DATA:  dyspnea EXAM: PORTABLE CHEST 1 VIEW COMPARISON:  CXR 09/23/22 FINDINGS: Left-sided loop recorder device in place. There is a hazy opacity at the left lung base could represent atelectasis or infection. No radiographically apparent displaced rib fractures. Normal cardiac and mediastinal contours. Visualized upper abdomen is notable for gas distended loops of bowel. There is subdiaphragmatic lucency that could represent air trapped in a gas distended stomach, but pneumoperitoneum is not excluded. Recommend further evaluation with a dedicated abdominal radiograph. IMPRESSION: 1. Hazy opacity at the left lung base could represent atelectasis or infection. 2. Subdiaphragmatic  lucency could  represent air trapped in a gas distended stomach, but pneumoperitoneum is not excluded. Recommend further evaluation with a dedicated abdominal radiograph. Electronically Signed   By: Lorenza Cambridge M.D.   On: 01/27/2023 18:39    Assessment and Plan:   Bradycardia He is having slow ventricular response in the setting of atrial fibrillation.  However it does look like he is slowing down with ventricular escape at times which was captured on 1 EKG.  This seems to correlate with some of the episodes that he has been having so it appears that this bradycardia episodes symptomatic.  If it is not on with his goals of care, will be warranted discussion on whether or not he should get pacemaker placement especially if he is going to need to remain on his beta-blocker therapy.  We have held this for now but I am not sure how much that has been contributing.  He also received his heart rates improved once electrolytes have been stabilized.  Will get a repeat echo to make sure that no change in function is occurred.  Recommendations: -Agree with management of AKI and repletion of electrolytes -Please hold carvedilol at this time -Will order repeat echo       For questions or updates, please contact [facility] Please consult [email or web address] for contact info under     Signed, Joellen Jersey, MD  01/28/2023 1:49 AM

## 2023-01-28 NOTE — Significant Event (Signed)
Rapid Response Note   Reason for Call : bradycardia Initial Focused Assessment:  Nursing staff notified me of pt with bradycardia and an order for Atropine. Trevor Murphy is alert, oriented to person, place and situation. Denies any pain, CP or SOB. Hr 35 junctional. 0.5mg  IV atropine ordered and given. HR increased to 55-61.   0002-Hr 56 junctional, 129/62 (80), RR 16 with sats 97% on 3L [place].    Interventions:  -Atropine 0.5mg  IV  Plan of Care:  -Cards consult per medical team      MD Notified: Dr Arville Care per primary RN Call Time: 2332 Arrival Time: 2340 End Time: 0025  Rose Fillers, RN

## 2023-01-28 NOTE — Progress Notes (Signed)
   01/27/23 2317  Assess: MEWS Score  Temp (!) 96 F (35.6 C)  BP (!) 116/48  MAP (mmHg) 69  Pulse Rate 60  ECG Heart Rate (!) (S)  32  Resp 16  SpO2 97 %  O2 Device Nasal Cannula  O2 Flow Rate (L/min) 3 L/min  Assess: MEWS Score  MEWS Temp 1  MEWS Systolic 0  MEWS Pulse 2  MEWS RR 0  MEWS LOC 0  MEWS Score 3  MEWS Score Color Yellow  Assess: if the MEWS score is Yellow or Red  Were vital signs taken at a resting state? Yes  Focused Assessment Change from prior assessment (see assessment flowsheet)  Does the patient meet 2 or more of the SIRS criteria? No  MEWS guidelines implemented  Yes, yellow  Treat  MEWS Interventions Considered administering scheduled or prn medications/treatments as ordered  Take Vital Signs  Increase Vital Sign Frequency  Yellow: Q2hr x1, continue Q4hrs until patient remains green for 12hrs  Escalate  MEWS: Escalate Yellow: Discuss with charge nurse and consider notifying provider and/or RRT  Notify: Charge Nurse/RN  Name of Charge Nurse/RN Notified [facility]  Provider Notification  Provider Name/Title Chen,DO and Mansy,MD  Date Provider Notified 01/27/23  Time Provider Notified 2327  Method of Notification  (Secure chat)  Notification Reason New onset of dysrhythmia (HR in the 20's-30's)  Provider response See new orders  Date of Provider Response 01/27/23  Time of Provider Response 2328  Notify: Rapid Response  Name of Rapid Response RN Notified David,RN  Date Rapid Response Notified 01/27/23  Time Rapid Response Notified 2335  Assess: SIRS CRITERIA  SIRS Temperature  1  SIRS Pulse 0  SIRS Respirations  0  SIRS WBC 0  SIRS Score Sum  1

## 2023-01-28 NOTE — Progress Notes (Signed)
PROGRESS NOTE    [name]  ZOX:[id number] DOB: 08/19/1934 DOA: 01/27/2023 PCP: [name], FNP   Brief Narrative:    Trevor Murphy is a 88 y.o. male with medical history significant for CAD artery disease, chronic diastolic CHF, paroxysmal atrial fibrillation, liver cirrhosis, essential hypertension, gout, glaucoma, osteoarthritis, stage III chronic kidney disease and SVT, who presented to the emergency room with acute onset of fatigue and tiredness with anorexia over the last couple of days to fluids and solid food.  He had a brief episode of unconsciousness while he was in the ER for about 2 to 3 minutes.  He was admitted for prerenal AKI as well as syncopal episode in the setting of symptomatic bradycardia.  Cardiology following for bradycardia with 2D echocardiogram ordered and pending.  Assessment & Plan:   Active Problems:   AKI (acute kidney injury) (HCC)   Syncope   Hyperkalemia   Chronic diastolic CHF (congestive heart failure) (HCC)   Coronary artery disease involving native coronary artery of native heart without angina pectoris   BPH (benign prostatic hyperplasia)   Alcoholic cirrhosis of liver with ascites (HCC)  Assessment and Plan:   AKI (acute kidney injury) (HCC)-improving - This likely prerenal due to volume depletion and dehydration from recent anorexia.  He has associated hyponatremia likely hypovolemic. - The patient will be admitted to a medical telemetry bed. - We will continue hydration with IV normal saline. - We will avoid nephrotoxins. - We will follow BMPs. -Start sodium bicarbonate tablets for associated metabolic acidosis   Syncope with bradycardia - This is likely secondary to symptomatic bradycardia. - Appreciate cardiology ongoing evaluation with 2D echocardiogram pending - We will check orthostatics every 12 hours. - She will be monitored for arrhythmias. - He had no evidence for hypoglycemia. -TSH 4.3   Hyperkalemia - This is clearly  secondary to #1. - This was aggressively managed and potassium will be followed. -Continue Lokelma as ordered   Coronary artery disease involving native coronary artery of native heart without angina pectoris - Coreg will be held off given his bradycardia.   Chronic diastolic CHF (congestive heart failure) (HCC) - We will continue his  Marcelline Deist and hold off Coreg. - We are holding off Aldactone and Lasix given AKI.   BPH (benign prostatic hyperplasia) - We will continue his Proscar.   Alcoholic cirrhosis of liver with ascites (HCC) - We will continue his lactulose. - Aldactone is being held off given his hyperkalemia.    DVT prophylaxis:Lovenox Code Status: DNR Family Communication: None at bedside Disposition Plan:  Status is: Inpatient Remains inpatient appropriate because: Continued need for IV fluids  Consultants:  Cardiology  Procedures:  None  Antimicrobials:  None   Subjective: Patient seen and evaluated today with no new acute complaints or concerns. No acute concerns or events noted overnight.  Currently getting 2D echocardiogram.  Objective: Vitals:   01/27/23 2317 01/28/23 0002 01/28/23 0142 01/28/23 0514  BP: (!) 116/48 129/62 (!) 144/55 (!) 111/55  Pulse: 60  (!) 43 79  Resp: 16 16 18 18   Temp: (!) 96 F (35.6 C)  (!) 96.2 F (35.7 C) (!) 96.4 F (35.8 C)  TempSrc:      SpO2: 97% 97% 99% 98%  Weight:      Height:        Intake/Output Summary (Last 24 hours) at 01/28/2023 0717 Last data filed at 01/28/2023 0555 Gross per 24 hour  Intake 2955.41 ml  Output 800 ml  Net 2155.41 ml   Filed Weights   01/27/23 1741  Weight: 52.2 kg    Examination:  General exam: Appears calm and comfortable  Respiratory system: Clear to auscultation. Respiratory effort normal. Cardiovascular system: S1 & S2 heard, RRR.  Mildly bradycardic. Gastrointestinal system: Abdomen is soft Central nervous system: Alert and awake Extremities: No edema Skin: No  significant lesions noted Psychiatry: Flat affect.    Data Reviewed: I have personally reviewed following labs and imaging studies  CBC: Recent Labs  Lab 01/27/23 1749 01/28/23 0449  WBC 4.5 4.2  NEUTROABS 2.6  --   HGB 12.3* 9.7*  HCT 39.4 31.0*  MCV 87.9 87.3  PLT 217 113*   Basic Metabolic Panel: Recent Labs  Lab 01/27/23 1749 01/28/23 0449  NA 128* 133*  K 6.4* 5.7*  CL 103 110  CO2 18* 17*  GLUCOSE 188* 103*  BUN 46* 43*  CREATININE 2.56* 2.20*  CALCIUM 7.7* 7.5*  MG 2.3  --    GFR: Estimated Creatinine Clearance: 18.1 mL/min (A) (by C-G formula based on SCr of 2.2 mg/dL (H)). Liver Function Tests: No results for input(s): "AST", "ALT", "ALKPHOS", "BILITOT", "PROT", "ALBUMIN" in the last 168 hours. No results for input(s): "LIPASE", "AMYLASE" in the last 168 hours. No results for input(s): "AMMONIA" in the last 168 hours. Coagulation Profile: No results for input(s): "INR", "PROTIME" in the last 168 hours. Cardiac Enzymes: No results for input(s): "CKTOTAL", "CKMB", "CKMBINDEX", "TROPONINI" in the last 168 hours. BNP (last 3 results) No results for input(s): "PROBNP" in the last 8760 hours. HbA1C: No results for input(s): "HGBA1C" in the last 72 hours. CBG: No results for input(s): "GLUCAP" in the last 168 hours. Lipid Profile: No results for input(s): "CHOL", "HDL", "LDLCALC", "TRIG", "CHOLHDL", "LDLDIRECT" in the last 72 hours. Thyroid Function Tests: Recent Labs    01/27/23 1749  TSH 4.320   Anemia Panel: No results for input(s): "VITAMINB12", "FOLATE", "FERRITIN", "TIBC", "IRON", "RETICCTPCT" in the last 72 hours. Sepsis Labs: No results for input(s): "PROCALCITON", "LATICACIDVEN" in the last 168 hours.  No results found for this or any previous visit (from the past 240 hour(s)).       Radiology Studies: CT ABDOMEN PELVIS WO CONTRAST  Result Date: 01/27/2023 CLINICAL DATA:  Acute abdominal pain EXAM: CT ABDOMEN AND PELVIS WITHOUT  CONTRAST TECHNIQUE: Multidetector CT imaging of the abdomen and pelvis was performed following the standard protocol without IV contrast. RADIATION DOSE REDUCTION: This exam was performed according to the departmental dose-optimization program which includes automated exposure control, adjustment of the mA and/or kV according to patient size and/or use of iterative reconstruction technique. COMPARISON:  11/22/2022 FINDINGS: Lower chest: Mild atelectatic changes are noted bilaterally. Hepatobiliary: Gallbladder is well distended. Liver appears within normal limits. Pancreas: Unremarkable. No pancreatic ductal dilatation or surrounding inflammatory changes. Spleen: Normal in size without focal abnormality. Adrenals/Urinary Tract: Adrenal glands are within normal limits. Kidneys demonstrate renal cystic change which appears simple in nature. No follow-up is recommended. No renal calculi or obstructive changes are seen. The bladder is decompressed. Stomach/Bowel: No obstructive or inflammatory changes of the colon are seen. Appendix is within normal limits. Contrast is seen within the stomach and proximal small bowel. No inflammatory changes are seen. Vascular/Lymphatic: Aortic atherosclerosis. No enlarged abdominal or pelvic lymph nodes. Reproductive: Prostate is unremarkable. Other: No abdominal wall hernia or abnormality. No abdominopelvic ascites. Musculoskeletal: Multilevel degenerative changes are noted without acute abnormality. IMPRESSION: No acute abnormality is noted to correspond with the given clinical  history. Electronically Signed   By: Alcide Clever M.D.   On: 01/27/2023 20:53   DG Chest Portable 1 View  Result Date: 01/27/2023 CLINICAL DATA:  dyspnea EXAM: PORTABLE CHEST 1 VIEW COMPARISON:  CXR 09/23/22 FINDINGS: Left-sided loop recorder device in place. There is a hazy opacity at the left lung base could represent atelectasis or infection. No radiographically apparent displaced rib fractures. Normal  cardiac and mediastinal contours. Visualized upper abdomen is notable for gas distended loops of bowel. There is subdiaphragmatic lucency that could represent air trapped in a gas distended stomach, but pneumoperitoneum is not excluded. Recommend further evaluation with a dedicated abdominal radiograph. IMPRESSION: 1. Hazy opacity at the left lung base could represent atelectasis or infection. 2. Subdiaphragmatic lucency could represent air trapped in a gas distended stomach, but pneumoperitoneum is not excluded. Recommend further evaluation with a dedicated abdominal radiograph. Electronically Signed   By: Lorenza Cambridge M.D.   On: 01/27/2023 18:39        Scheduled Meds:  brinzolamide  1 drop Both Eyes TID   enoxaparin (LOVENOX) injection  30 mg Subcutaneous Q24H   ferrous sulfate  325 mg Oral Q breakfast   finasteride  5 mg Oral Daily   latanoprost  1 drop Both Eyes QHS   sodium zirconium cyclosilicate  10 g Oral Daily   Continuous Infusions:  sodium chloride 100 mL/hr at 01/28/23 0515     LOS: 1 day    Time spent: 35 minutes    Kenzi Bardwell Hoover Brunette, DO [facility]  If 7PM-7AM, please contact night-coverage [email or web address] 01/28/2023, 7:17 AM

## 2023-01-29 DIAGNOSIS — R64 Cachexia: Secondary | ICD-10-CM

## 2023-01-29 DIAGNOSIS — R001 Bradycardia, unspecified: Secondary | ICD-10-CM | POA: Diagnosis not present

## 2023-01-29 DIAGNOSIS — N179 Acute kidney failure, unspecified: Secondary | ICD-10-CM | POA: Diagnosis not present

## 2023-01-29 DIAGNOSIS — I471 Supraventricular tachycardia, unspecified: Secondary | ICD-10-CM | POA: Diagnosis not present

## 2023-01-29 LAB — BASIC METABOLIC PANEL
Anion gap: 8 (ref 5–15)
BUN: 32 mg/dL — ABNORMAL HIGH (ref 8–23)
CO2: 18 mmol/L — ABNORMAL LOW (ref 22–32)
Calcium: 7.8 mg/dL — ABNORMAL LOW (ref 8.9–10.3)
Chloride: 111 mmol/L (ref 98–111)
Creatinine, Ser: 1.92 mg/dL — ABNORMAL HIGH (ref 0.61–1.24)
GFR, Estimated: 34 mL/min — ABNORMAL LOW (ref >60)
Glucose, Bld: 121 mg/dL — ABNORMAL HIGH (ref 70–99)
Potassium: 5.1 mmol/L (ref 3.5–5.1)
Sodium: 137 mmol/L (ref 135–145)

## 2023-01-29 LAB — CBC
HCT: 30.8 % — ABNORMAL LOW (ref 39.0–52.0)
Hemoglobin: 9.8 g/dL — ABNORMAL LOW (ref 13.0–17.0)
MCH: 27.1 pg (ref 26.0–34.0)
MCHC: 31.8 g/dL (ref 30.0–36.0)
MCV: 85.3 fL (ref 80.0–100.0)
Platelets: 100 10*3/uL — ABNORMAL LOW (ref 150–400)
RBC: 3.61 MIL/uL — ABNORMAL LOW (ref 4.22–5.81)
RDW: 15.1 % (ref 11.5–15.5)
WBC: 3.3 10*3/uL — ABNORMAL LOW (ref 4.0–10.5)
nRBC: 0 % (ref 0.0–0.2)

## 2023-01-29 LAB — MAGNESIUM: Magnesium: 2.1 mg/dL (ref 1.7–2.4)

## 2023-01-29 LAB — GLUCOSE, CAPILLARY
Glucose-Capillary: 130 mg/dL — ABNORMAL HIGH (ref 70–99)
Glucose-Capillary: 100 mg/dL — ABNORMAL HIGH (ref 70–99)

## 2023-01-29 MED ORDER — ADENOSINE 6 MG/2ML IV SOLN
6.0000 mg | Freq: Once | INTRAVENOUS | Status: AC
  Administered 2023-01-29: 6 mg via INTRAVENOUS

## 2023-01-29 MED ORDER — CARVEDILOL 3.125 MG PO TABS
3.1250 mg | ORAL_TABLET | Freq: Two times a day (BID) | ORAL | Status: DC
  Administered 2023-01-29 – 2023-02-02 (×9): 3.125 mg via ORAL
  Filled 2023-01-29 (×9): qty 1

## 2023-01-29 NOTE — Progress Notes (Signed)
HR 120's, ST, Dr. Anne Fu and Ronie Spies made aware  Trevor Murphy

## 2023-01-29 NOTE — Care Management Important Message (Signed)
Important Message  Patient Details  Name: Trevor Murphy MRN: [id number] Date of Birth: [birth year]   Medicare Important Message Given:  Yes     Dorena Bodo 01/29/2023, 3:21 PM

## 2023-01-29 NOTE — Consult Note (Addendum)
ELECTROPHYSIOLOGY CONSULT NOTE    Patient ID: Trevor Murphy MRN: [id number], DOB/AGE: [birth year] 88 y.o.  Admit date: 01/27/2023 Date of Consult: 01/29/2023  Primary Physician: [name], FNP Primary Cardiologist: Orbie Pyo, MD  Electrophysiologist: Dr. Elberta Fortis (loop recorder for CVA)  Referring Provider: Dr. Anne Fu  Patient Profile: Trevor Murphy is a 88 y.o. male with a history of mild CAD, chronic diastolic CHF, PAF, CVA s/p loop recorder 2020, +PFO (not felt contributory to CVA), Mild MR/Mod AI, SVT, and thrombocytopenia who is being seen today for the evaluation of tachy-brady at the request of Dr. Anne Fu.  HPI:  [name] is a 88 y.o. male with medical history as above.   PT last seen by cardiology 06/2022 for his CAD and diastolic CHF. Discussed goals of care given his advanced age and co-morbidities. Was on eliquis at that time.   Eliquis discontinued 09/2022 with worsening liver decompensation and severe thrombocytopenia.    Had recurrent admission 11/2022 for acute blood loss anemia with GI/Diverticular bleed and Hgb into the 5 range OFF OAC, so likely not candidate to re-challenge.  Pt presented to [facility] 01/27/2023 with fatigue, inability to tolerate food, and had syncope in the ED waiting room.  Found to have HRs down into the 20s as well as hyperkalemia and AKI.   Labs on admission include Cr 2.56 (from 1.39), K 6.4, Na 128, BNP 547, Hgb 12.3, WBC 4.5, Mg 2.3, HS trop 11, TSH 4.320  EKG on arrival shows wide (RBBB) junctional rhythm in the upper 20s.  Difficulty to see clear p waves, but suspect AF as underlying atrial rhythm.  Given atropine with improvement of his HR and cardiology fellow called to see. HR on their exam in the 40s with BP 104/48.  Telemetry showed AF with slow VR.   Beta blocker was held.   Echo 01/28/2023 shows LVEF 60-65%, mild LVH, mod LAE.  Cr has improved 2.56 -> 2.2 -> 1.92. K 5.1 this am.   This am ~ 0600 he went into a narrow complex  tachycardia around 120 bpm. EP asked to see for recommendations given tachy-brady syndrome  Coreg previously stopped with brady events on device (asymptomatic). Resumed 11/2022 for "BP and cirrhosis/portal HTN"  Currently, pt is at rest and asymptomatic. States he started feeling overall bad at the beginning of the week. Poor oral intake and + nausea. Per loop, HRs had been stable since March/April. Pt denies having syncope prior to this event in the ER.  He is willing to do anything we think will help him feel better, up to and including pacing.   Labs Potassium5.1 (06/21 5621) Magnesium  2.1 (06/21 0317) Creatinine, ser  1.92* (06/21 0317) PLT  100* (06/21 0317) HGB  9.8* (06/21 0317) WBC 3.3* (06/21 0317) Troponin I (High Sensitivity)9 (06/19 2302).    Past Medical History:  Diagnosis Date   Arthritis    CAD (coronary artery disease)    a. Cath 08/2009: minimal nonobstructive disease (25% LAD). b. Lexiscan nuc 6/215: EF 54%, no ischemia or infarction.   Chronic diastolic CHF (congestive heart failure) (HCC)    a. Echo 2013: EF 55-60%, mild AI, mild MR. b. Echo 5/15 with EF 60-65%, no WMA, mild LVH, mild AI.   CKD (chronic kidney disease) stage 3, GFR 30-59 ml/min (HCC) 07/26/2012   a. Felt likely due to HTN.   CVA (cerebral infarction) 07/28/2012   Glaucoma    Gout    H/O umbilical hernia repair  Headache(784.0)    " WHEN MY BLOOD PRESSURE IS UP"   History of acute renal failure    History of alcohol abuse    HTN (hypertension)    Hypertension    a. Patent renal arteries 2005.   Noncompliance    OA (osteoarthritis)    Other and unspecified hyperlipidemia    Poor circulation    Sinus bradycardia    SVT (supraventricular tachycardia)    a. Possible AVNRT.  Got adenosine from EMS in 2015 for SVT.  During 5/15 hospitalization had episode of short R-P tachycardia (possible AVNRT) terminated by adenosine.  b. 04/2016 - recurrent tachycardia (SVT vs. AVNRT), again terminated by  Adenosine   Visual impairment      Surgical History:  Past Surgical History:  Procedure Laterality Date   APPENDECTOMY     BIOPSY  08/12/2020   Procedure: BIOPSY;  Surgeon: Charna Elizabeth, MD;  Location: [facility] ENDOSCOPY;  Service: Endoscopy;;   BUBBLE STUDY  05/24/2019   Procedure: BUBBLE STUDY;  Surgeon: Little Ishikawa, MD;  Location: [facility] ENDOSCOPY;  Service: Endoscopy;;   CARDIAC CATHETERIZATION  11/2003   EF 50-55%   CARDIAC CATHETERIZATION  08/2009   CATARACT EXTRACTION EXTRACAPSULAR Right 05/17/2013   Procedure: CATARACT EXTRACTION EXTRACAPSULAR WITH INTRAOCULAR LENS PLACEMENT (IOC) RIGHT EYE;  Surgeon: Chalmers Guest, MD;  Location: [facility] OR;  Service: Ophthalmology;  Laterality: Right;   COLONOSCOPY     ESOPHAGOGASTRODUODENOSCOPY (EGD) WITH PROPOFOL N/A 08/12/2020   Procedure: ESOPHAGOGASTRODUODENOSCOPY (EGD) WITH PROPOFOL;  Surgeon: Charna Elizabeth, MD;  Location: [facility] ENDOSCOPY;  Service: Endoscopy;  Laterality: N/A;   HERNIA REPAIR     INGUINAL HERNIA REPAIR Left 11/20/2022   Procedure: OPEN LEFT INGUINAL HERNIA REPAIR FOR GANGRENE AND SMALL BOWEL RESECTION WITH STINASTIMOSIST;  Surgeon: Kinsinger, De Blanch, MD;  Location: [facility] OR;  Service: General;  Laterality: Left;   IR PARACENTESIS  09/24/2022   LOOP RECORDER INSERTION N/A 05/24/2019   Procedure: LOOP RECORDER INSERTION;  Surgeon: Regan Lemming, MD;  Location: [facility] INVASIVE CV LAB;  Service: Cardiovascular;  Laterality: N/A;   TEE WITHOUT CARDIOVERSION N/A 05/24/2019   Procedure: TRANSESOPHAGEAL ECHOCARDIOGRAM (TEE);  Surgeon: Little Ishikawa, MD;  Location: [facility] ENDOSCOPY;  Service: Endoscopy;  Laterality: N/A;   VENTRAL HERNIA REPAIR  03/07/2012   Procedure: LAPAROSCOPIC VENTRAL HERNIA;  Surgeon: Cherylynn Ridges, MD;  Location: [facility] OR;  Service: General;  Laterality: N/A;     Medications Prior to Admission  Medication Sig Dispense Refill Last Dose   acetaminophen (TYLENOL) 500 MG tablet Take 1,000 mg by mouth 2 (two) times  daily as needed for mild pain or moderate pain (leg pain).   01/26/2023   carvedilol (COREG) 6.25 MG tablet Take 1 tablet (6.25 mg total) by mouth 2 (two) times daily with a meal. 180 tablet 1 01/27/2023 at 1200   dapagliflozin propanediol ([name]) 10 MG TABS tablet Take 1 tablet (10 mg total) by mouth daily before breakfast. 30 tablet 2 01/27/2023 at 1200   ferrous sulfate (FEROSUL) 325 (65 FE) MG tablet TAKE 1 TABLET(325 MG) BY MOUTH DAILY (Patient taking differently: Take 325 mg by mouth daily.) 90 tablet 1 01/27/2023 at 1200   finasteride (PROSCAR) 5 MG tablet Take 5 mg by mouth daily.   01/27/2023 at 1200   furosemide (LASIX) 20 MG tablet Take 1 tablet (20 mg total) by mouth daily. 90 tablet 1 01/27/2023 at 1200   LUMIGAN 0.01 % SOLN Place 1 drop into both eyes at bedtime.   01/27/2023  at am   [facility] 1-0.2 % SUSP Follow up with your PCP on when to restart this medication. (Patient taking differently: Place 1 drop into both eyes 3 (three) times daily.)   01/27/2023 at pm-1 drop   spironolactone (ALDACTONE) 50 MG tablet Take 1 tablet (50 mg total) by mouth daily. 30 tablet 3 01/27/2023 at am   lactulose (CHRONULAC) 10 GM/15ML solution Take 15 mLs (10 g total) by mouth 3 (three) times daily as needed for mild constipation. (Patient not taking: Reported on 01/27/2023) 473 mL 0 Not Taking   traMADol (ULTRAM) 50 MG tablet Take 1 tablet (50 mg total) by mouth every 6 (six) hours as needed for moderate pain. (Patient not taking: Reported on 01/27/2023) 15 tablet 0 Not Taking    Inpatient Medications:   brinzolamide  1 drop Both Eyes TID   ferrous sulfate  325 mg Oral Q breakfast   finasteride  5 mg Oral Daily   latanoprost  1 drop Both Eyes QHS   sodium bicarbonate  650 mg Oral BID    Allergies: No Known Allergies  Family History  Problem Relation Age of Onset   CVA Mother    Hypertension Brother    Hypertension Sister    Hypertension Brother    Stroke Sister    Heart attack Neg Hx       Physical Exam: Vitals:   01/29/23 0554 01/29/23 0600 01/29/23 0629 01/29/23 0845  BP: (!) 145/73  132/71 114/79  Pulse: 72  62 (!) 125  Resp:   15 18  Temp: 97.9 F (36.6 C)  97.6 F (36.4 C) 97.6 F (36.4 C)  TempSrc: Oral  Oral Oral  SpO2: 100%  100% 96%  Weight:  51.9 kg    Height:        GEN- NAD, A&O x 3, normal affect HEENT: Normocephalic, atraumatic Lungs- CTAB, Normal effort.  Heart- Regular rate and rhythm, No M/G/R.  GI- Soft, NT, ND.  Extremities- No clubbing, cyanosis, or edema   Radiology/Studies: ECHOCARDIOGRAM COMPLETE  Result Date: 01/28/2023    ECHOCARDIOGRAM REPORT   Patient Name:   BATU CASSIN Date of Exam: 01/28/2023 Medical Rec #:  [id number]    Height:       70.0 in Accession #:    [id number]   Weight:       115.0 lb Date of Birth:  01/02/1935    BSA:          1.650 m Patient Age:    87 years     BP:           111/55 mmHg Patient Gender: M            HR:           56 bpm. Exam Location:  Inpatient Procedure: 2D Echo, Cardiac Doppler and Color Doppler Indications:    Syncope R55  History:        Patient has prior history of Echocardiogram examinations, most                 recent 09/24/2022. CHF, CAD, Pacemaker, Stroke, Arrythmias:Atrial                 Fibrillation, Bradycardia and Tachycardia,                 Signs/Symptoms:Syncope; Risk Factors:Dyslipidemia. CKD, stage 3.  Sonographer:    [name] Referring Phys: [id number] JAN A MANSY IMPRESSIONS  1. Left ventricular ejection fraction, by estimation, is 60 to  65%. The left ventricle has normal function. The left ventricle has no regional wall motion abnormalities. There is mild left ventricular hypertrophy. Left ventricular diastolic parameters are indeterminate.  2. Right ventricular systolic function is normal. The right ventricular size is normal. There is mildly elevated pulmonary artery systolic pressure.  3. Left atrial size was moderately dilated.  4. The mitral valve is grossly normal. Mild mitral  valve regurgitation.  5. AI jet directed toward the anterior mitral valve leaflet. The aortic valve is normal in structure. Aortic valve regurgitation is moderate.  6. The inferior vena cava is normal in size with greater than 50% respiratory variability, suggesting right atrial pressure of 3 mmHg. Comparison(s): No significant change from prior study. FINDINGS  Left Ventricle: Left ventricular ejection fraction, by estimation, is 60 to 65%. The left ventricle has normal function. The left ventricle has no regional wall motion abnormalities. The left ventricular internal cavity size was normal in size. There is  mild left ventricular hypertrophy. Left ventricular diastolic parameters are indeterminate. Right Ventricle: The right ventricular size is normal. Right ventricular systolic function is normal. There is mildly elevated pulmonary artery systolic pressure. The tricuspid regurgitant velocity is 3.03 m/s, and with an assumed right atrial pressure of 3 mmHg, the estimated right ventricular systolic pressure is 39.7 mmHg. Left Atrium: Left atrial size was moderately dilated. Right Atrium: Right atrial size was normal in size. Pericardium: There is no evidence of pericardial effusion. Mitral Valve: The mitral valve is grossly normal. Mild mitral valve regurgitation. Tricuspid Valve: Tricuspid valve regurgitation is mild. Aortic Valve: AI jet directed toward the anterior mitral valve leaflet. The aortic valve is normal in structure. Aortic valve regurgitation is moderate. Aortic valve peak gradient measures 7.9 mmHg. Pulmonic Valve: Pulmonic valve regurgitation is trivial. Aorta: The aortic root and ascending aorta are structurally normal, with no evidence of dilitation. Venous: The inferior vena cava is normal in size with greater than 50% respiratory variability, suggesting right atrial pressure of 3 mmHg. IAS/Shunts: The interatrial septum was not well visualized.  LEFT VENTRICLE PLAX 2D LVIDd:         4.40 cm    Diastology LVIDs:         2.40 cm   LV e' lateral:   14.50 cm/s LV PW:         1.10 cm   LV E/e' lateral: 2.7 LV IVS:        1.20 cm LVOT diam:     2.20 cm LV SV:         82 LV SV Index:   50 LVOT Area:     3.80 cm  RIGHT VENTRICLE             IVC RV S prime:     17.60 cm/s  IVC diam: 1.40 cm TAPSE (M-mode): 3.1 cm LEFT ATRIUM             Index        RIGHT ATRIUM           Index LA diam:        3.60 cm 2.18 cm/m   RA Area:     13.90 cm LA Vol (A2C):   43.3 ml 26.24 ml/m  RA Volume:   27.90 ml  16.91 ml/m LA Vol (A4C):   53.8 ml 32.61 ml/m LA Biplane Vol: 51.8 ml 31.40 ml/m  AORTIC VALVE                 PULMONIC VALVE  AV Area (Vmax): 2.58 cm     PR End Diast Vel: 6.35 msec AV Vmax:        140.50 cm/s AV Peak Grad:   7.9 mmHg LVOT Vmax:      95.43 cm/s LVOT Vmean:     56.700 cm/s LVOT VTI:       0.215 m  AORTA Ao Root diam: 3.70 cm Ao Asc diam:  3.60 cm MITRAL VALVE               TRICUSPID VALVE MV Area (PHT): 3.34 cm    TR Peak grad:   36.7 mmHg MV Decel Time: 227 msec    TR Vmax:        303.00 cm/s MR Peak grad: 108.6 mmHg MR Vmax:      521.00 cm/s  SHUNTS MV E velocity: 38.63 cm/s  Systemic VTI:  0.22 m MV A velocity: 66.70 cm/s  Systemic Diam: 2.20 cm MV E/A ratio:  0.58 Photographer signed by Carolan Clines Signature Date/Time: 01/28/2023/9:41:25 AM    Final    CT ABDOMEN PELVIS WO CONTRAST  Result Date: 01/27/2023 CLINICAL DATA:  Acute abdominal pain EXAM: CT ABDOMEN AND PELVIS WITHOUT CONTRAST TECHNIQUE: Multidetector CT imaging of the abdomen and pelvis was performed following the standard protocol without IV contrast. RADIATION DOSE REDUCTION: This exam was performed according to the departmental dose-optimization program which includes automated exposure control, adjustment of the mA and/or kV according to patient size and/or use of iterative reconstruction technique. COMPARISON:  11/22/2022 FINDINGS: Lower chest: Mild atelectatic changes are noted bilaterally. Hepatobiliary:  Gallbladder is well distended. Liver appears within normal limits. Pancreas: Unremarkable. No pancreatic ductal dilatation or surrounding inflammatory changes. Spleen: Normal in size without focal abnormality. Adrenals/Urinary Tract: Adrenal glands are within normal limits. Kidneys demonstrate renal cystic change which appears simple in nature. No follow-up is recommended. No renal calculi or obstructive changes are seen. The bladder is decompressed. Stomach/Bowel: No obstructive or inflammatory changes of the colon are seen. Appendix is within normal limits. Contrast is seen within the stomach and proximal small bowel. No inflammatory changes are seen. Vascular/Lymphatic: Aortic atherosclerosis. No enlarged abdominal or pelvic lymph nodes. Reproductive: Prostate is unremarkable. Other: No abdominal wall hernia or abnormality. No abdominopelvic ascites. Musculoskeletal: Multilevel degenerative changes are noted without acute abnormality. IMPRESSION: No acute abnormality is noted to correspond with the given clinical history. Electronically Signed   By: Alcide Clever M.D.   On: 01/27/2023 20:53   DG Chest Portable 1 View  Result Date: 01/27/2023 CLINICAL DATA:  dyspnea EXAM: PORTABLE CHEST 1 VIEW COMPARISON:  CXR 09/23/22 FINDINGS: Left-sided loop recorder device in place. There is a hazy opacity at the left lung base could represent atelectasis or infection. No radiographically apparent displaced rib fractures. Normal cardiac and mediastinal contours. Visualized upper abdomen is notable for gas distended loops of bowel. There is subdiaphragmatic lucency that could represent air trapped in a gas distended stomach, but pneumoperitoneum is not excluded. Recommend further evaluation with a dedicated abdominal radiograph. IMPRESSION: 1. Hazy opacity at the left lung base could represent atelectasis or infection. 2. Subdiaphragmatic lucency could represent air trapped in a gas distended stomach, but pneumoperitoneum is  not excluded. Recommend further evaluation with a dedicated abdominal radiograph. Electronically Signed   By: Lorenza Cambridge M.D.   On: 01/27/2023 18:39    EKG: today shows SVT in 120s. (?AVNRT but cannot rule out other mechanisms of SVT)  (personally reviewed)  EKG in ED during acute  episode shows junctional escape in the 20s. Difficulty to see clear p waves, but suspect AF as underlying atrial rhythm  TELEMETRY: NSR 60-70s mostly until this am just after 0630. Not in what appears to be an SVT in 120-130s. Last brady event by telemetry and loop recorder morning of 01/28/2023 (personally reviewed)  DEVICE HISTORY: ILR implanted 05/2019 for cryptogenic stroke.  Has brady events as far back as 05/2019. Coreg stropped. Had continued intermittent brady, noted to be asymptomatic and instructed to call with symptoms.   Device interrogation shows HRs in the mid 30s for approx 4 minutes midnight into 6/19, and then signficant bradycardia (<30bpm) around the time of his episode in the ED.  Otherwise, his most recent episode of bradycardia was back in April   Assessment/Plan:  Tachy-brady syndrome H/o SVT (?AVNRT) Long history of mostly asymptomatic sinus bradycardia, into 30s SVT started around 0630 this and, and likely driven by acute process.  He had not had this arrhyhtmia since his admission in March/April for anemia by his loop recorder.  Suspect bradycardia also driven by his acute dehydration and AKI, as histograms overall stable and bradycardia really didn't start per loop until overnight into the day he was admitted. Where he was feeling poorly several days before with poor oral intake.  Overall he is a poor candidate for EP procedures.  Ultimately may end up requiring high risk pacing, potentially as palliation.  Thus far, his bradycardia has been asymptomatic outside of acute illnesses.  Would follow him through this to see how his HRs settle out.   Pt was given adenosine with rapid response  team with conversion of his SVT to NSR/Sinus tach   (Return to NSR not shown, strip cuts off)  AKI Improving with IVF HRs appear to have been mostly stable when he first started feeling poorly by loop recorder.  Hyperkalemia In setting of AKI  Alcoholic cirrhosis of liver with ascites On Lactulose  Spironolactone on hold with AKI.   Thrombocytopenia PLT  100* (06/21 0317)   HGB  9.8* (06/21 0317)  Cachexia  LV function normal  Dr. Graciela Husbands has seen and SVT converted with adenosine as above. No plans for pacing at this time. Follow HRs through his acute issues.   For questions or updates, please contact [facility] Please consult [email or web address] for contact info under Cardiology/STEMI.  Dustin Flock, PA-C  01/29/2023 9:25 AM  Problem list as above  Patient has SVT.  Based on his loop recorder interrogation, however, it is exceedingly infrequent and so the 2 episodes today seem to be anomalous.  They are also asymptomatic so we terminated the first with adenosine but we will watch the second 1 now.  The bradycardia that he had in the ER occurred in the context of significant hyperkalemia (K6.4) and again his loop recorder does not show significant bradycardia.  Hence, we have elected to not intervene in this tachy bradycardia question procedurally.  With his recurrent tachycardia, we will resume low-dose beta-blockade we will see how things unfold.  Definitive therapy in this gentleman would probably be slow pathway modification.  Alternative would be backup VVI pacing but hopefully we can avoid intervening and procedures on this very sick 88 year old man with end-stage cirrhosis

## 2023-01-29 NOTE — Progress Notes (Addendum)
Progress Note  Patient Name: Trevor Murphy Date of Encounter: 01/29/2023  Primary Cardiologist: Orbie Pyo, MD  Subjective   Appears to have gone into a narrow complex tachycardia around 6:30am. Completely asymptomatic from cardiac standpoint. Cheerfully denies any symptoms except for feeling a little tired. A+Ox3.  Last bradycardia appears shortly around noon yesterday.  Inpatient Medications    Scheduled Meds:  brinzolamide  1 drop Both Eyes TID   ferrous sulfate  325 mg Oral Q breakfast   finasteride  5 mg Oral Daily   latanoprost  1 drop Both Eyes QHS   sodium bicarbonate  650 mg Oral BID   Continuous Infusions:  sodium chloride 100 mL/hr at 01/29/23 0122   PRN Meds: acetaminophen **OR** acetaminophen, lactulose, magnesium hydroxide, ondansetron **OR** ondansetron (ZOFRAN) IV, traMADol, traZODone   Vital Signs    Vitals:   01/29/23 0554 01/29/23 0600 01/29/23 0629 01/29/23 0845  BP: (!) 145/73  132/71 114/79  Pulse: 72  62 (!) 125  Resp:   15 18  Temp: 97.9 F (36.6 C)  97.6 F (36.4 C) 97.6 F (36.4 C)  TempSrc: Oral  Oral Oral  SpO2: 100%  100% 96%  Weight:  51.9 kg    Height:        Intake/Output Summary (Last 24 hours) at 01/29/2023 0909 Last data filed at 01/29/2023 0601 Gross per 24 hour  Intake 2605.65 ml  Output 1350 ml  Net 1255.65 ml      01/29/2023    6:00 AM 01/27/2023    5:41 PM 12/08/2022    3:29 PM  Last 3 Weights  Weight (lbs) 114 lb 6.7 oz 115 lb 137 lb  Weight (kg) 51.9 kg 52.164 kg 62.143 kg     Telemetry    As above - Personally Reviewed  ECG    Accelerated junctional rhythm vs SVT 122bpm, IRBBB, nonspecific STT changes - Personally Reviewed  Physical Exam   GEN: No acute distress.  HEENT: Normocephalic, atraumatic, sclera non-icteric. Neck: No JVD or bruits. Cardiac: RRR no murmurs, rubs, or gallops.  Respiratory: Clear to auscultation bilaterally. Breathing is unlabored. GI: Soft, nontender, non-distended, BS +x  4. MS: no deformity. Extremities: No clubbing or cyanosis. No edema. Distal pedal pulses are 2+ and equal bilaterally. Neuro:  AAOx3. Follows commands. Psych:  Responds to questions appropriately with a normal affect.  Labs    High Sensitivity Troponin:   Recent Labs  Lab 01/27/23 1749 01/27/23 2102 01/27/23 2302  TROPONINIHS 11 8 9       Cardiac EnzymesNo results for input(s): "TROPONINI" in the last 168 hours. No results for input(s): "TROPIPOC" in the last 168 hours.   Chemistry Recent Labs  Lab 01/27/23 1749 01/28/23 0449 01/29/23 0317  NA 128* 133* 137  K 6.4* 5.7* 5.1  CL 103 110 111  CO2 18* 17* 18*  GLUCOSE 188* 103* 121*  BUN 46* 43* 32*  CREATININE 2.56* 2.20* 1.92*  CALCIUM 7.7* 7.5* 7.8*  GFRNONAA 24* 29* 34*  ANIONGAP 7 6 8      Hematology Recent Labs  Lab 01/27/23 1749 01/28/23 0449 01/29/23 0317  WBC 4.5 4.2 3.3*  RBC 4.48 3.55* 3.61*  HGB 12.3* 9.7* 9.8*  HCT 39.4 31.0* 30.8*  MCV 87.9 87.3 85.3  MCH 27.5 27.3 27.1  MCHC 31.2 31.3 31.8  RDW 15.3 15.1 15.1  PLT 217 113* 100*    BNP Recent Labs  Lab 01/27/23 1749  BNP 547.0*     DDimer No results for  input(s): "DDIMER" in the last 168 hours.   Radiology    ECHOCARDIOGRAM COMPLETE  Result Date: 01/28/2023    ECHOCARDIOGRAM REPORT   Patient Name:   Trevor Murphy Date of Exam: 01/28/2023 Medical Rec #:  [id number]    Height:       70.0 in Accession #:    [id number]   Weight:       115.0 lb Date of Birth:  [birth year]    BSA:          1.650 m Patient Age:    87 years     BP:           111/55 mmHg Patient Gender: M            HR:           56 bpm. Exam Location:  Inpatient Procedure: 2D Echo, Cardiac Doppler and Color Doppler Indications:    Syncope R55  History:        Patient has prior history of Echocardiogram examinations, most                 recent 09/24/2022. CHF, CAD, Pacemaker, Stroke, Arrythmias:Atrial                 Fibrillation, Bradycardia and Tachycardia,                  Signs/Symptoms:Syncope; Risk Factors:Dyslipidemia. CKD, stage 3.  Sonographer:    [name] Referring Phys: [id number] JAN A MANSY IMPRESSIONS  1. Left ventricular ejection fraction, by estimation, is 60 to 65%. The left ventricle has normal function. The left ventricle has no regional wall motion abnormalities. There is mild left ventricular hypertrophy. Left ventricular diastolic parameters are indeterminate.  2. Right ventricular systolic function is normal. The right ventricular size is normal. There is mildly elevated pulmonary artery systolic pressure.  3. Left atrial size was moderately dilated.  4. The mitral valve is grossly normal. Mild mitral valve regurgitation.  5. AI jet directed toward the anterior mitral valve leaflet. The aortic valve is normal in structure. Aortic valve regurgitation is moderate.  6. The inferior vena cava is normal in size with greater than 50% respiratory variability, suggesting right atrial pressure of 3 mmHg. Comparison(s): No significant change from prior study. FINDINGS  Left Ventricle: Left ventricular ejection fraction, by estimation, is 60 to 65%. The left ventricle has normal function. The left ventricle has no regional wall motion abnormalities. The left ventricular internal cavity size was normal in size. There is  mild left ventricular hypertrophy. Left ventricular diastolic parameters are indeterminate. Right Ventricle: The right ventricular size is normal. Right ventricular systolic function is normal. There is mildly elevated pulmonary artery systolic pressure. The tricuspid regurgitant velocity is 3.03 m/s, and with an assumed right atrial pressure of 3 mmHg, the estimated right ventricular systolic pressure is 39.7 mmHg. Left Atrium: Left atrial size was moderately dilated. Right Atrium: Right atrial size was normal in size. Pericardium: There is no evidence of pericardial effusion. Mitral Valve: The mitral valve is grossly normal. Mild mitral valve  regurgitation. Tricuspid Valve: Tricuspid valve regurgitation is mild. Aortic Valve: AI jet directed toward the anterior mitral valve leaflet. The aortic valve is normal in structure. Aortic valve regurgitation is moderate. Aortic valve peak gradient measures 7.9 mmHg. Pulmonic Valve: Pulmonic valve regurgitation is trivial. Aorta: The aortic root and ascending aorta are structurally normal, with no evidence of dilitation. Venous: The inferior vena cava is normal in size with greater than  50% respiratory variability, suggesting right atrial pressure of 3 mmHg. IAS/Shunts: The interatrial septum was not well visualized.  LEFT VENTRICLE PLAX 2D LVIDd:         4.40 cm   Diastology LVIDs:         2.40 cm   LV e' lateral:   14.50 cm/s LV PW:         1.10 cm   LV E/e' lateral: 2.7 LV IVS:        1.20 cm LVOT diam:     2.20 cm LV SV:         82 LV SV Index:   50 LVOT Area:     3.80 cm  RIGHT VENTRICLE             IVC RV S prime:     17.60 cm/s  IVC diam: 1.40 cm TAPSE (M-mode): 3.1 cm LEFT ATRIUM             Index        RIGHT ATRIUM           Index LA diam:        3.60 cm 2.18 cm/m   RA Area:     13.90 cm LA Vol (A2C):   43.3 ml 26.24 ml/m  RA Volume:   27.90 ml  16.91 ml/m LA Vol (A4C):   53.8 ml 32.61 ml/m LA Biplane Vol: 51.8 ml 31.40 ml/m  AORTIC VALVE                 PULMONIC VALVE AV Area (Vmax): 2.58 cm     PR End Diast Vel: 6.35 msec AV Vmax:        140.50 cm/s AV Peak Grad:   7.9 mmHg LVOT Vmax:      95.43 cm/s LVOT Vmean:     56.700 cm/s LVOT VTI:       0.215 m  AORTA Ao Root diam: 3.70 cm Ao Asc diam:  3.60 cm MITRAL VALVE               TRICUSPID VALVE MV Area (PHT): 3.34 cm    TR Peak grad:   36.7 mmHg MV Decel Time: 227 msec    TR Vmax:        303.00 cm/s MR Peak grad: 108.6 mmHg MR Vmax:      521.00 cm/s  SHUNTS MV E velocity: 38.63 cm/s  Systemic VTI:  0.22 m MV A velocity: 66.70 cm/s  Systemic Diam: 2.20 cm MV E/A ratio:  0.58 Photographer signed by Carolan Clines Signature Date/Time:  01/28/2023/9:41:25 AM    Final    CT ABDOMEN PELVIS WO CONTRAST  Result Date: 01/27/2023 CLINICAL DATA:  Acute abdominal pain EXAM: CT ABDOMEN AND PELVIS WITHOUT CONTRAST TECHNIQUE: Multidetector CT imaging of the abdomen and pelvis was performed following the standard protocol without IV contrast. RADIATION DOSE REDUCTION: This exam was performed according to the departmental dose-optimization program which includes automated exposure control, adjustment of the mA and/or kV according to patient size and/or use of iterative reconstruction technique. COMPARISON:  11/22/2022 FINDINGS: Lower chest: Mild atelectatic changes are noted bilaterally. Hepatobiliary: Gallbladder is well distended. Liver appears within normal limits. Pancreas: Unremarkable. No pancreatic ductal dilatation or surrounding inflammatory changes. Spleen: Normal in size without focal abnormality. Adrenals/Urinary Tract: Adrenal glands are within normal limits. Kidneys demonstrate renal cystic change which appears simple in nature. No follow-up is recommended. No renal calculi or obstructive changes are seen. The bladder is decompressed.  Stomach/Bowel: No obstructive or inflammatory changes of the colon are seen. Appendix is within normal limits. Contrast is seen within the stomach and proximal small bowel. No inflammatory changes are seen. Vascular/Lymphatic: Aortic atherosclerosis. No enlarged abdominal or pelvic lymph nodes. Reproductive: Prostate is unremarkable. Other: No abdominal wall hernia or abnormality. No abdominopelvic ascites. Musculoskeletal: Multilevel degenerative changes are noted without acute abnormality. IMPRESSION: No acute abnormality is noted to correspond with the given clinical history. Electronically Signed   By: Alcide Clever M.D.   On: 01/27/2023 20:53   DG Chest Portable 1 View  Result Date: 01/27/2023 CLINICAL DATA:  dyspnea EXAM: PORTABLE CHEST 1 VIEW COMPARISON:  CXR 09/23/22 FINDINGS: Left-sided loop recorder  device in place. There is a hazy opacity at the left lung base could represent atelectasis or infection. No radiographically apparent displaced rib fractures. Normal cardiac and mediastinal contours. Visualized upper abdomen is notable for gas distended loops of bowel. There is subdiaphragmatic lucency that could represent air trapped in a gas distended stomach, but pneumoperitoneum is not excluded. Recommend further evaluation with a dedicated abdominal radiograph. IMPRESSION: 1. Hazy opacity at the left lung base could represent atelectasis or infection. 2. Subdiaphragmatic lucency could represent air trapped in a gas distended stomach, but pneumoperitoneum is not excluded. Recommend further evaluation with a dedicated abdominal radiograph. Electronically Signed   By: Lorenza Cambridge M.D.   On: 01/27/2023 18:39    Cardiac Studies   2D echo 01/28/23  1. Left ventricular ejection fraction, by estimation, is 60 to 65%. The  left ventricle has normal function. The left ventricle has no regional  wall motion abnormalities. There is mild left ventricular hypertrophy.  Left ventricular diastolic parameters  are indeterminate.   2. Right ventricular systolic function is normal. The right ventricular  size is normal. There is mildly elevated pulmonary artery systolic  pressure.   3. Left atrial size was moderately dilated.   4. The mitral valve is grossly normal. Mild mitral valve regurgitation.   5. AI jet directed toward the anterior mitral valve leaflet. The aortic  valve is normal in structure. Aortic valve regurgitation is moderate.   6. The inferior vena cava is normal in size with greater than 50%  respiratory variability, suggesting right atrial pressure of 3 mmHg.   Comparison(s): No significant change from prior study.   Patient Profile     88 y.o. male with mild coronary artery disease by cath 2011, chronic diastolic heart failure, paroxysmal atrial fibrillation, ILR in place with prior  bradycardia by monitoring 10/2022, ETOH abuse, cirrhosis, hypertension, CKD 3a, PSVT, stroke (has PFO but not felt to be PFO associated), aortic atherosclerosis. Presented with fatigue, inability to tolerate food, with syncope in ED waiting room. Found to have bradycardia with HR as low as the 20s in context of hyperkalemia with AKI.   Assessment & Plan    1. Symptomatic bradycardia with LOC in ED, now with tachycardia 01/29/23 SVT vs junctional tach - bradycardia suspected worsened by metabolic derangement with hyperkalemia in context of AKI though also had this noted on ILR interrogation in March (short lived) and April as well - carvedilol started 11/2022 during internal med admission - AKI/hyperkalemia being followed by primary team, K normalized - continue to hold carvedilol - with tachy-brady, will ask EP to see in consult today   2. H/o PAF  - per note review, Eliquis discontinued by medicine team in 09/2022 with worsening liver decompensation and severe thrombocytopenia, had been discussed with GI at that  time, also admitted 11/2022 with ABL anemia with Hgb down to 5 range in setting of diverticular bleed (OFF anticoagulation) therefore likely poor OAC candidate going forward as well - holding carvedilol as above - EP to see as above   3. Chronic HFpEF - intravascularly depleted on exam with AKI - avoid diuretics including spiro, Lasix, SGLT2i   4. Mild MR/moderate AI by echo - follow clinically   5. Mild CAD - denies angina  6. Thrombocytopenia, anemia - suspect normalized Hgb/plt on admission were hemoconcentrated from dehydration and that values today reflect more true state - per primary team  For questions or updates, please contact [facility] Please consult [email or web address] for contact info under Cardiology/STEMI.  Signed, Laurann Montana, PA-C 01/29/2023, 9:09 AM    Personally seen and examined. Agree with above.  Getting EP to see. Tachy brady. Stopped all AVN  blocking agents Not a good candidate for device. SVT this AM 125 bpm. Looks like retrograde P. Recent HR 25 bpm.  Loop in place PAF No anticoagulation PLT dropping 217 to 100  Donato Schultz, MD

## 2023-01-29 NOTE — TOC CM/SW Note (Signed)
Transition of Care [facility]) - Inpatient Brief Assessment   Patient Details  Name: Trevor Murphy MRN: [id number] Date of Birth: October 25, 1934  Transition of Care [facility]) CM/SW Contact:    Tom-Johnson, Hershal Coria, RN Phone Number: 01/29/2023, 4:34 PM   Clinical Narrative:  Patient presented to the ED with Fatigue, difficulty tolerating fluids and solid food. Patient had a Syncopal episode in the ED waiting room. Found to have Hyperkalemia and AKI. On IV fluids. Cardiology following for HR.  From home with alone, states his son spends the night and his sister and nephew assists during the daytime.  Has a cane, walker w/c, shower seat and grab bars at home.  PCP is [name], FNP and uses [facility] on [address]    No TOC needs or recommendations noted at this time. Patient was recently discharged from home health by [facility].  CM will continue to follow as patient progresses with care towards discharge.     Transition of Care Asessment: Insurance and Status: Insurance coverage has been reviewed Patient has primary care physician: Yes Home environment has been reviewed: Yes Prior level of function:: Modified [place] Prior/Current Home Services: Current home services (Home health with [facility]) Social Determinants of Health Reivew: SDOH reviewed no interventions necessary Readmission risk has been reviewed: Yes Transition of care needs: transition of care needs identified, TOC will continue to follow

## 2023-01-29 NOTE — Progress Notes (Signed)
PROGRESS NOTE    Trevor Murphy  IHK:[id number] DOB: 1935/07/24 DOA: 01/27/2023 PCP: [name], FNP   Brief Narrative:    Trevor Murphy is a 88 y.o. male with medical history significant for CAD artery disease, chronic diastolic CHF, paroxysmal atrial fibrillation, liver cirrhosis, essential hypertension, gout, glaucoma, osteoarthritis, stage III chronic kidney disease and SVT, who presented to the emergency room with acute onset of fatigue and tiredness with anorexia over the last couple of days to fluids and solid food.  He had a brief episode of unconsciousness while he was in the ER for about 2 to 3 minutes.  He was admitted for prerenal AKI as well as syncopal episode in the setting of symptomatic bradycardia.  Cardiology following for bradycardia that appears to be related to hyperkalemia and is improving.  2D echocardiogram with no significant findings.  AKI slowly improving with IV fluid. Now with tachycardia and Cardiology to consult EP for further evaluation.  Assessment & Plan:   Active Problems:   AKI (acute kidney injury) (HCC)   Syncope   Hyperkalemia   Chronic diastolic CHF (congestive heart failure) (HCC)   Coronary artery disease involving native coronary artery of native heart without angina pectoris   BPH (benign prostatic hyperplasia)   Alcoholic cirrhosis of liver with ascites (HCC)  Assessment and Plan:   AKI (acute kidney injury) (HCC)-improving - This likely prerenal due to volume depletion and dehydration from recent anorexia.  - The patient will be admitted to a medical telemetry bed. - We will continue hydration with IV normal saline. - We will avoid nephrotoxins. - We will follow BMPs. -Start sodium bicarbonate tablets for associated metabolic acidosis   Syncope with bradycardia, now tachycardia - This is likely secondary to symptomatic bradycardia. - Appreciate cardiology ongoing evaluation with 2D echocardiogram demonstrating no significant  findings - We will check orthostatics every 12 hours. - She will be monitored for arrhythmias. - He had no evidence for hypoglycemia. -TSH 4.3 -Now with tachycardia, EP to evaluate   Hyperkalemia-resolved - This is clearly secondary to #1. - This was aggressively managed and potassium will be followed. -DC lokelma   Coronary artery disease involving native coronary artery of native heart without angina pectoris - Coreg will be held off given his bradycardia.   Chronic diastolic CHF (congestive heart failure) (HCC) - We will continue his  Marcelline Deist and hold off Coreg. - We are holding off Aldactone and Lasix given AKI.   BPH (benign prostatic hyperplasia) - We will continue his Proscar.   Alcoholic cirrhosis of liver with ascites (HCC) - We will continue his lactulose. - Aldactone is being held off given his hyperkalemia.    DVT prophylaxis:Lovenox Code Status: DNR Family Communication: None at bedside Disposition Plan:  Status is: Inpatient Remains inpatient appropriate because: Continued need for IV fluids  Consultants:  Cardiology/EP  Procedures:  None  Antimicrobials:  None   Subjective: Patient seen and evaluated today with no new acute complaints or concerns. Now with tachycardia this am, but asymptomatic.  Objective: Vitals:   01/29/23 0554 01/29/23 0600 01/29/23 0629 01/29/23 0845  BP: (!) 145/73  132/71 114/79  Pulse: 72  62 (!) 125  Resp:   15 18  Temp: 97.9 F (36.6 C)  97.6 F (36.4 C) 97.6 F (36.4 C)  TempSrc: Oral  Oral Oral  SpO2: 100%  100% 96%  Weight:  51.9 kg    Height:        Intake/Output Summary (Last 24  hours) at 01/29/2023 0905 Last data filed at 01/29/2023 0601 Gross per 24 hour  Intake 2605.65 ml  Output 1350 ml  Net 1255.65 ml   Filed Weights   01/27/23 1741 01/29/23 0600  Weight: 52.2 kg 51.9 kg    Examination:  General exam: Appears calm and comfortable  Respiratory system: Clear to auscultation. Respiratory effort  normal. Cardiovascular system: S1 & S2 heard, RRR.  Tachycardic. Gastrointestinal system: Abdomen is soft Central nervous system: Alert and awake Extremities: No edema Skin: No significant lesions noted Psychiatry: Flat affect.    Data Reviewed: I have personally reviewed following labs and imaging studies  CBC: Recent Labs  Lab 01/27/23 1749 01/28/23 0449 01/29/23 0317  WBC 4.5 4.2 3.3*  NEUTROABS 2.6  --   --   HGB 12.3* 9.7* 9.8*  HCT 39.4 31.0* 30.8*  MCV 87.9 87.3 85.3  PLT 217 113* 100*   Basic Metabolic Panel: Recent Labs  Lab 01/27/23 1749 01/28/23 0449 01/29/23 0317  NA 128* 133* 137  K 6.4* 5.7* 5.1  CL 103 110 111  CO2 18* 17* 18*  GLUCOSE 188* 103* 121*  BUN 46* 43* 32*  CREATININE 2.56* 2.20* 1.92*  CALCIUM 7.7* 7.5* 7.8*  MG 2.3  --  2.1   GFR: Estimated Creatinine Clearance: 20.6 mL/min (A) (by C-G formula based on SCr of 1.92 mg/dL (H)). Liver Function Tests: No results for input(s): "AST", "ALT", "ALKPHOS", "BILITOT", "PROT", "ALBUMIN" in the last 168 hours. No results for input(s): "LIPASE", "AMYLASE" in the last 168 hours. No results for input(s): "AMMONIA" in the last 168 hours. Coagulation Profile: No results for input(s): "INR", "PROTIME" in the last 168 hours. Cardiac Enzymes: No results for input(s): "CKTOTAL", "CKMB", "CKMBINDEX", "TROPONINI" in the last 168 hours. BNP (last 3 results) No results for input(s): "PROBNP" in the last 8760 hours. HbA1C: No results for input(s): "HGBA1C" in the last 72 hours. CBG: Recent Labs  Lab 01/27/23 1818  GLUCAP 130*   Lipid Profile: No results for input(s): "CHOL", "HDL", "LDLCALC", "TRIG", "CHOLHDL", "LDLDIRECT" in the last 72 hours. Thyroid Function Tests: Recent Labs    01/27/23 1749  TSH 4.320   Anemia Panel: No results for input(s): "VITAMINB12", "FOLATE", "FERRITIN", "TIBC", "IRON", "RETICCTPCT" in the last 72 hours. Sepsis Labs: No results for input(s): "PROCALCITON",  "LATICACIDVEN" in the last 168 hours.  No results found for this or any previous visit (from the past 240 hour(s)).       Radiology Studies: ECHOCARDIOGRAM COMPLETE  Result Date: 01/28/2023    ECHOCARDIOGRAM REPORT   Patient Name:   Trevor Murphy Date of Exam: 01/28/2023 Medical Rec #:  [id number]    Height:       70.0 in Accession #:    [id number]   Weight:       115.0 lb Date of Birth:  Oct 22, 1934    BSA:          1.650 m Patient Age:    87 years     BP:           111/55 mmHg Patient Gender: M            HR:           56 bpm. Exam Location:  Inpatient Procedure: 2D Echo, Cardiac Doppler and Color Doppler Indications:    Syncope R55  History:        Patient has prior history of Echocardiogram examinations, most  recent 09/24/2022. CHF, CAD, Pacemaker, Stroke, Arrythmias:Atrial                 Fibrillation, Bradycardia and Tachycardia,                 Signs/Symptoms:Syncope; Risk Factors:Dyslipidemia. CKD, stage 3.  Sonographer:    [name] Referring Phys: [id number] JAN A MANSY IMPRESSIONS  1. Left ventricular ejection fraction, by estimation, is 60 to 65%. The left ventricle has normal function. The left ventricle has no regional wall motion abnormalities. There is mild left ventricular hypertrophy. Left ventricular diastolic parameters are indeterminate.  2. Right ventricular systolic function is normal. The right ventricular size is normal. There is mildly elevated pulmonary artery systolic pressure.  3. Left atrial size was moderately dilated.  4. The mitral valve is grossly normal. Mild mitral valve regurgitation.  5. AI jet directed toward the anterior mitral valve leaflet. The aortic valve is normal in structure. Aortic valve regurgitation is moderate.  6. The inferior vena cava is normal in size with greater than 50% respiratory variability, suggesting right atrial pressure of 3 mmHg. Comparison(s): No significant change from prior study. FINDINGS  Left Ventricle: Left ventricular  ejection fraction, by estimation, is 60 to 65%. The left ventricle has normal function. The left ventricle has no regional wall motion abnormalities. The left ventricular internal cavity size was normal in size. There is  mild left ventricular hypertrophy. Left ventricular diastolic parameters are indeterminate. Right Ventricle: The right ventricular size is normal. Right ventricular systolic function is normal. There is mildly elevated pulmonary artery systolic pressure. The tricuspid regurgitant velocity is 3.03 m/s, and with an assumed right atrial pressure of 3 mmHg, the estimated right ventricular systolic pressure is 39.7 mmHg. Left Atrium: Left atrial size was moderately dilated. Right Atrium: Right atrial size was normal in size. Pericardium: There is no evidence of pericardial effusion. Mitral Valve: The mitral valve is grossly normal. Mild mitral valve regurgitation. Tricuspid Valve: Tricuspid valve regurgitation is mild. Aortic Valve: AI jet directed toward the anterior mitral valve leaflet. The aortic valve is normal in structure. Aortic valve regurgitation is moderate. Aortic valve peak gradient measures 7.9 mmHg. Pulmonic Valve: Pulmonic valve regurgitation is trivial. Aorta: The aortic root and ascending aorta are structurally normal, with no evidence of dilitation. Venous: The inferior vena cava is normal in size with greater than 50% respiratory variability, suggesting right atrial pressure of 3 mmHg. IAS/Shunts: The interatrial septum was not well visualized.  LEFT VENTRICLE PLAX 2D LVIDd:         4.40 cm   Diastology LVIDs:         2.40 cm   LV e' lateral:   14.50 cm/s LV PW:         1.10 cm   LV E/e' lateral: 2.7 LV IVS:        1.20 cm LVOT diam:     2.20 cm LV SV:         82 LV SV Index:   50 LVOT Area:     3.80 cm  RIGHT VENTRICLE             IVC RV S prime:     17.60 cm/s  IVC diam: 1.40 cm TAPSE (M-mode): 3.1 cm LEFT ATRIUM             Index        RIGHT ATRIUM           Index LA diam:  3.60 cm 2.18 cm/m   RA Area:     13.90 cm LA Vol (A2C):   43.3 ml 26.24 ml/m  RA Volume:   27.90 ml  16.91 ml/m LA Vol (A4C):   53.8 ml 32.61 ml/m LA Biplane Vol: 51.8 ml 31.40 ml/m  AORTIC VALVE                 PULMONIC VALVE AV Area (Vmax): 2.58 cm     PR End Diast Vel: 6.35 msec AV Vmax:        140.50 cm/s AV Peak Grad:   7.9 mmHg LVOT Vmax:      95.43 cm/s LVOT Vmean:     56.700 cm/s LVOT VTI:       0.215 m  AORTA Ao Root diam: 3.70 cm Ao Asc diam:  3.60 cm MITRAL VALVE               TRICUSPID VALVE MV Area (PHT): 3.34 cm    TR Peak grad:   36.7 mmHg MV Decel Time: 227 msec    TR Vmax:        303.00 cm/s MR Peak grad: 108.6 mmHg MR Vmax:      521.00 cm/s  SHUNTS MV E velocity: 38.63 cm/s  Systemic VTI:  0.22 m MV A velocity: 66.70 cm/s  Systemic Diam: 2.20 cm MV E/A ratio:  0.58 Photographer signed by Carolan Clines Signature Date/Time: 01/28/2023/9:41:25 AM    Final    CT ABDOMEN PELVIS WO CONTRAST  Result Date: 01/27/2023 CLINICAL DATA:  Acute abdominal pain EXAM: CT ABDOMEN AND PELVIS WITHOUT CONTRAST TECHNIQUE: Multidetector CT imaging of the abdomen and pelvis was performed following the standard protocol without IV contrast. RADIATION DOSE REDUCTION: This exam was performed according to the departmental dose-optimization program which includes automated exposure control, adjustment of the mA and/or kV according to patient size and/or use of iterative reconstruction technique. COMPARISON:  11/22/2022 FINDINGS: Lower chest: Mild atelectatic changes are noted bilaterally. Hepatobiliary: Gallbladder is well distended. Liver appears within normal limits. Pancreas: Unremarkable. No pancreatic ductal dilatation or surrounding inflammatory changes. Spleen: Normal in size without focal abnormality. Adrenals/Urinary Tract: Adrenal glands are within normal limits. Kidneys demonstrate renal cystic change which appears simple in nature. No follow-up is recommended. No renal calculi or obstructive  changes are seen. The bladder is decompressed. Stomach/Bowel: No obstructive or inflammatory changes of the colon are seen. Appendix is within normal limits. Contrast is seen within the stomach and proximal small bowel. No inflammatory changes are seen. Vascular/Lymphatic: Aortic atherosclerosis. No enlarged abdominal or pelvic lymph nodes. Reproductive: Prostate is unremarkable. Other: No abdominal wall hernia or abnormality. No abdominopelvic ascites. Musculoskeletal: Multilevel degenerative changes are noted without acute abnormality. IMPRESSION: No acute abnormality is noted to correspond with the given clinical history. Electronically Signed   By: Alcide Clever M.D.   On: 01/27/2023 20:53   DG Chest Portable 1 View  Result Date: 01/27/2023 CLINICAL DATA:  dyspnea EXAM: PORTABLE CHEST 1 VIEW COMPARISON:  CXR 09/23/22 FINDINGS: Left-sided loop recorder device in place. There is a hazy opacity at the left lung base could represent atelectasis or infection. No radiographically apparent displaced rib fractures. Normal cardiac and mediastinal contours. Visualized upper abdomen is notable for gas distended loops of bowel. There is subdiaphragmatic lucency that could represent air trapped in a gas distended stomach, but pneumoperitoneum is not excluded. Recommend further evaluation with a dedicated abdominal radiograph. IMPRESSION: 1. Hazy opacity at the left lung base could represent  atelectasis or infection. 2. Subdiaphragmatic lucency could represent air trapped in a gas distended stomach, but pneumoperitoneum is not excluded. Recommend further evaluation with a dedicated abdominal radiograph. Electronically Signed   By: Lorenza Cambridge M.D.   On: 01/27/2023 18:39        Scheduled Meds:  brinzolamide  1 drop Both Eyes TID   enoxaparin (LOVENOX) injection  30 mg Subcutaneous Q24H   ferrous sulfate  325 mg Oral Q breakfast   finasteride  5 mg Oral Daily   latanoprost  1 drop Both Eyes QHS   sodium  bicarbonate  650 mg Oral BID   sodium zirconium cyclosilicate  10 g Oral Daily   Continuous Infusions:  sodium chloride 100 mL/hr at 01/29/23 0122     LOS: 2 days    Time spent: 35 minutes    Haliey Romberg Hoover Brunette, DO [facility]  If 7PM-7AM, please contact night-coverage [email or web address] 01/29/2023, 9:05 AM

## 2023-01-29 NOTE — Plan of Care (Signed)

## 2023-01-30 DIAGNOSIS — N179 Acute kidney failure, unspecified: Secondary | ICD-10-CM | POA: Diagnosis not present

## 2023-01-30 DIAGNOSIS — I471 Supraventricular tachycardia, unspecified: Secondary | ICD-10-CM | POA: Diagnosis not present

## 2023-01-30 LAB — CBC
HCT: 31.1 % — ABNORMAL LOW (ref 39.0–52.0)
Hemoglobin: 9.8 g/dL — ABNORMAL LOW (ref 13.0–17.0)
MCH: 27.6 pg (ref 26.0–34.0)
MCHC: 31.5 g/dL (ref 30.0–36.0)
MCV: 87.6 fL (ref 80.0–100.0)
Platelets: 113 K/uL — ABNORMAL LOW (ref 150–400)
RBC: 3.55 MIL/uL — ABNORMAL LOW (ref 4.22–5.81)
RDW: 15.4 % (ref 11.5–15.5)
WBC: 3.6 K/uL — ABNORMAL LOW (ref 4.0–10.5)
nRBC: 0 % (ref 0.0–0.2)

## 2023-01-30 LAB — BASIC METABOLIC PANEL WITH GFR
Anion gap: 8 (ref 5–15)
BUN: 24 mg/dL — ABNORMAL HIGH (ref 8–23)
CO2: 16 mmol/L — ABNORMAL LOW (ref 22–32)
Calcium: 7.4 mg/dL — ABNORMAL LOW (ref 8.9–10.3)
Chloride: 117 mmol/L — ABNORMAL HIGH (ref 98–111)
Creatinine, Ser: 1.58 mg/dL — ABNORMAL HIGH (ref 0.61–1.24)
GFR, Estimated: 43 mL/min — ABNORMAL LOW
Glucose, Bld: 116 mg/dL — ABNORMAL HIGH (ref 70–99)
Potassium: 5 mmol/L (ref 3.5–5.1)
Sodium: 141 mmol/L (ref 135–145)

## 2023-01-30 LAB — MAGNESIUM: Magnesium: 1.9 mg/dL (ref 1.7–2.4)

## 2023-01-30 NOTE — Progress Notes (Signed)
Progress Note  Patient Name: Trevor Murphy Date of Encounter: 01/30/2023  Primary Cardiologist: Orbie Pyo, MD     Patient Profile     88 y.o. male admitted with syncope found to be profoundly bradycardic in the context of hypokalemia this, improved.  History of tachybradycardia syndrome and recurrent in-hospital SVT (probably AV nodal reentry) which dates back at least to 2017 [facility]) severe decompensated liver disease with thrombocytopenia, portal hypertension with secondary cachexia and ascites  Loop recorder in place has demonstrated infrequent pauses and infrequent tachycardia. Subjective   Some palpitations last night feels better now no complaints of chest pain or shortness of breath  Inpatient Medications    Scheduled Meds:  brinzolamide  1 drop Both Eyes TID   carvedilol  3.125 mg Oral BID WC   ferrous sulfate  325 mg Oral Q breakfast   finasteride  5 mg Oral Daily   latanoprost  1 drop Both Eyes QHS   sodium bicarbonate  650 mg Oral BID   Continuous Infusions:  sodium chloride 100 mL/hr at 01/30/23 0612   PRN Meds: acetaminophen **OR** acetaminophen, lactulose, magnesium hydroxide, ondansetron **OR** ondansetron (ZOFRAN) IV, traMADol, traZODone   Vital Signs    Vitals:   01/29/23 1502 01/29/23 2028 01/30/23 0610 01/30/23 0856  BP: 133/74 132/83 113/70   Pulse: (!) 123 (!) 105 86 (!) 124  Resp: 18 18 16    Temp: 98 F (36.7 C) 98.3 F (36.8 C) 98.3 F (36.8 C)   TempSrc: Oral Oral Oral   SpO2: 98% 100% 95%   Weight:      Height:        Intake/Output Summary (Last 24 hours) at 01/30/2023 0954 Last data filed at 01/30/2023 0900 Gross per 24 hour  Intake 1242.31 ml  Output 875 ml  Net 367.31 ml   Filed Weights   01/27/23 1741 01/29/23 0600  Weight: 52.2 kg 51.9 kg    Telemetry    Recurrent SVT.  Most of the night.  ECG      Physical Exam    GEN: No acute distress.  Cachectic Cardiac: R rapid but RR  MS: No edema; No deformity. Neuro:   Nonfocal  Psych: Normal affect   Labs    Chemistry Recent Labs  Lab 01/28/23 0449 01/29/23 0317 01/30/23 0307  NA 133* 137 141  K 5.7* 5.1 5.0  CL 110 111 117*  CO2 17* 18* 16*  GLUCOSE 103* 121* 116*  BUN 43* 32* 24*  CREATININE 2.20* 1.92* 1.58*  CALCIUM 7.5* 7.8* 7.4*  GFRNONAA 29* 34* 43*  ANIONGAP 6 8 8      Hematology Recent Labs  Lab 01/28/23 0449 01/29/23 0317 01/30/23 0307  WBC 4.2 3.3* 3.6*  RBC 3.55* 3.61* 3.55*  HGB 9.7* 9.8* 9.8*  HCT 31.0* 30.8* 31.1*  MCV 87.3 85.3 87.6  MCH 27.3 27.1 27.6  MCHC 31.3 31.8 31.5  RDW 15.1 15.1 15.4  PLT 113* 100* 113*    Cardiac EnzymesNo results for input(s): "TROPONINI" in the last 168 hours. No results for input(s): "TROPIPOC" in the last 168 hours.   BNP Recent Labs  Lab 01/27/23 1749  BNP 547.0*     DDimer No results for input(s): "DDIMER" in the last 168 hours.   Radiology    No results found.  Cardiac Studies     Assessment & Plan    Tachy-brady syndrome H/o SVT (?AVNRT)  Long history of mostly asymptomatic sinus bradycardia, into 30s   AKI  Hyperkalemia  Alcoholic cirrhosis of liver with ascites   Thrombocytopenia   Cachexia   LV function normal    Obviously our approach is very nonaggressive.  Loath to push medications because of bradycardia he has tolerated relatively slow SVT.  Will see again Monday unless her issues For questions or updates, please contact [facility] Please consult [email or web address] for contact info under Cardiology/STEMI.      Signed, Sherryl Manges, MD  01/30/2023, 9:54 AM

## 2023-01-30 NOTE — Progress Notes (Signed)
PROGRESS NOTE    Trevor Murphy  NWG:[id number] DOB: [birth year] DOA: 01/27/2023 PCP: [name], FNP   Brief Narrative:    Trevor Murphy is a 88 y.o. male with medical history significant for CAD artery disease, chronic diastolic CHF, paroxysmal atrial fibrillation, liver cirrhosis, essential hypertension, gout, glaucoma, osteoarthritis, stage III chronic kidney disease and SVT, who presented to the emergency room with acute onset of fatigue and tiredness with anorexia over the last couple of days to fluids and solid food.  He had a brief episode of unconsciousness while he was in the ER for about 2 to 3 minutes.  He was admitted for prerenal AKI as well as syncopal episode in the setting of symptomatic bradycardia.  Cardiology following for bradycardia that appears to be related to hyperkalemia and is improving.  2D echocardiogram with no significant findings.  AKI slowly improving with IV fluid.  Noted to have tachycardia with SVT 6/21 requiring adenosine with improvement in rate control.  He has been seen by EP and now started back on Coreg.  He continues to have elevated heart rates that will require close monitoring.  Consulted palliative for further goals of care discussion as it appears that aggressive intervention would not be beneficial given his overall medical condition.  Assessment & Plan:   Active Problems:   AKI (acute kidney injury) (HCC)   Syncope   Hyperkalemia   Chronic diastolic CHF (congestive heart failure) (HCC)   Coronary artery disease involving native coronary artery of native heart without angina pectoris   BPH (benign prostatic hyperplasia)   Alcoholic cirrhosis of liver with ascites (HCC)  Assessment and Plan:   AKI (acute kidney injury) (HCC)-improving - This likely prerenal due to volume depletion and dehydration from recent anorexia.  - The patient will be admitted to a medical telemetry bed. - We will continue hydration with IV normal saline and appears to  have poor oral intake - We will avoid nephrotoxins. - We will follow BMPs. -Start sodium bicarbonate tablets for associated metabolic acidosis   Syncope with bradycardia, now tachycardia - This is likely secondary to symptomatic bradycardia. - Appreciate cardiology ongoing evaluation with 2D echocardiogram demonstrating no significant findings - We will check orthostatics every 12 hours. - She will be monitored for arrhythmias. - He had no evidence for hypoglycemia. -TSH 4.3 -Now with tachycardia, EP to evaluate   Hyperkalemia-resolved - This is clearly secondary to #1. - This was aggressively managed and potassium will be followed. -DC lokelma   Coronary artery disease involving native coronary artery of native heart without angina pectoris - Coreg will be held off given his bradycardia.   Chronic diastolic CHF (congestive heart failure) (HCC) - We will continue his  Marcelline Deist and hold off Coreg. - We are holding off Aldactone and Lasix given AKI.   BPH (benign prostatic hyperplasia) - We will continue his Proscar.   Alcoholic cirrhosis of liver with ascites (HCC) - We will continue his lactulose. - Aldactone is being held off given his hyperkalemia.    DVT prophylaxis:Lovenox Code Status: DNR Family Communication: None at bedside Disposition Plan:  Status is: Inpatient Remains inpatient appropriate because: Continued need for IV fluids  Consultants:  Cardiology/EP Palliative care  Procedures:  None  Antimicrobials:  None   Subjective: Patient seen and evaluated today with no new acute complaints or concerns. Now with tachycardia this am, but asymptomatic.  He does not appear to be eating well and remains mostly somnolent.  Objective: Vitals:  01/29/23 1502 01/29/23 2028 01/30/23 0610 01/30/23 0856  BP: 133/74 132/83 113/70   Pulse: (!) 123 (!) 105 86 (!) 124  Resp: 18 18 16    Temp: 98 F (36.7 C) 98.3 F (36.8 C) 98.3 F (36.8 C)   TempSrc: Oral Oral  Oral   SpO2: 98% 100% 95%   Weight:      Height:        Intake/Output Summary (Last 24 hours) at 01/30/2023 1114 Last data filed at 01/30/2023 0900 Gross per 24 hour  Intake 1242.31 ml  Output 875 ml  Net 367.31 ml   Filed Weights   01/27/23 1741 01/29/23 0600  Weight: 52.2 kg 51.9 kg    Examination:  General exam: Appears calm and comfortable, somnolent but arousable Respiratory system: Clear to auscultation. Respiratory effort normal. Cardiovascular system: S1 & S2 heard, RRR.  Tachycardic. Gastrointestinal system: Abdomen is soft Central nervous system: Somnolent Extremities: No edema Skin: No significant lesions noted Psychiatry: Flat affect.    Data Reviewed: I have personally reviewed following labs and imaging studies  CBC: Recent Labs  Lab 01/27/23 1749 01/28/23 0449 01/29/23 0317 01/30/23 0307  WBC 4.5 4.2 3.3* 3.6*  NEUTROABS 2.6  --   --   --   HGB 12.3* 9.7* 9.8* 9.8*  HCT 39.4 31.0* 30.8* 31.1*  MCV 87.9 87.3 85.3 87.6  PLT 217 113* 100* 113*   Basic Metabolic Panel: Recent Labs  Lab 01/27/23 1749 01/28/23 0449 01/29/23 0317 01/30/23 0307  NA 128* 133* 137 141  K 6.4* 5.7* 5.1 5.0  CL 103 110 111 117*  CO2 18* 17* 18* 16*  GLUCOSE 188* 103* 121* 116*  BUN 46* 43* 32* 24*  CREATININE 2.56* 2.20* 1.92* 1.58*  CALCIUM 7.7* 7.5* 7.8* 7.4*  MG 2.3  --  2.1 1.9   GFR: Estimated Creatinine Clearance: 25.1 mL/min (A) (by C-G formula based on SCr of 1.58 mg/dL (H)). Liver Function Tests: No results for input(s): "AST", "ALT", "ALKPHOS", "BILITOT", "PROT", "ALBUMIN" in the last 168 hours. No results for input(s): "LIPASE", "AMYLASE" in the last 168 hours. No results for input(s): "AMMONIA" in the last 168 hours. Coagulation Profile: No results for input(s): "INR", "PROTIME" in the last 168 hours. Cardiac Enzymes: No results for input(s): "CKTOTAL", "CKMB", "CKMBINDEX", "TROPONINI" in the last 168 hours. BNP (last 3 results) No results for  input(s): "PROBNP" in the last 8760 hours. HbA1C: No results for input(s): "HGBA1C" in the last 72 hours. CBG: Recent Labs  Lab 01/27/23 1818 01/29/23 1614  GLUCAP 130* 100*   Lipid Profile: No results for input(s): "CHOL", "HDL", "LDLCALC", "TRIG", "CHOLHDL", "LDLDIRECT" in the last 72 hours. Thyroid Function Tests: Recent Labs    01/27/23 1749  TSH 4.320   Anemia Panel: No results for input(s): "VITAMINB12", "FOLATE", "FERRITIN", "TIBC", "IRON", "RETICCTPCT" in the last 72 hours. Sepsis Labs: No results for input(s): "PROCALCITON", "LATICACIDVEN" in the last 168 hours.  No results found for this or any previous visit (from the past 240 hour(s)).       Radiology Studies: No results found.      Scheduled Meds:  brinzolamide  1 drop Both Eyes TID   carvedilol  3.125 mg Oral BID WC   ferrous sulfate  325 mg Oral Q breakfast   finasteride  5 mg Oral Daily   latanoprost  1 drop Both Eyes QHS   sodium bicarbonate  650 mg Oral BID   Continuous Infusions:  sodium chloride 100 mL/hr at 01/30/23 [phone]  LOS: 3 days    Time spent: 35 minutes    Juno Alers Hoover Brunette, DO [facility]  If 7PM-7AM, please contact night-coverage [email or web address] 01/30/2023, 11:14 AM

## 2023-01-31 DIAGNOSIS — I5032 Chronic diastolic (congestive) heart failure: Secondary | ICD-10-CM | POA: Diagnosis not present

## 2023-01-31 DIAGNOSIS — I471 Supraventricular tachycardia, unspecified: Secondary | ICD-10-CM | POA: Diagnosis not present

## 2023-01-31 DIAGNOSIS — R001 Bradycardia, unspecified: Secondary | ICD-10-CM | POA: Diagnosis not present

## 2023-01-31 DIAGNOSIS — Z515 Encounter for palliative care: Secondary | ICD-10-CM | POA: Diagnosis not present

## 2023-01-31 DIAGNOSIS — R531 Weakness: Secondary | ICD-10-CM

## 2023-01-31 DIAGNOSIS — I2510 Atherosclerotic heart disease of native coronary artery without angina pectoris: Secondary | ICD-10-CM | POA: Diagnosis not present

## 2023-01-31 LAB — BASIC METABOLIC PANEL
Anion gap: 4 — ABNORMAL LOW (ref 5–15)
BUN: 18 mg/dL (ref 8–23)
CO2: 16 mmol/L — ABNORMAL LOW (ref 22–32)
Calcium: 7.2 mg/dL — ABNORMAL LOW (ref 8.9–10.3)
Chloride: 118 mmol/L — ABNORMAL HIGH (ref 98–111)
Creatinine, Ser: 1.5 mg/dL — ABNORMAL HIGH (ref 0.61–1.24)
GFR, Estimated: 45 mL/min — ABNORMAL LOW (ref >60)
Glucose, Bld: 110 mg/dL — ABNORMAL HIGH (ref 70–99)
Potassium: 4.6 mmol/L (ref 3.5–5.1)
Sodium: 138 mmol/L (ref 135–145)

## 2023-01-31 LAB — CBC
HCT: 29.1 % — ABNORMAL LOW (ref 39.0–52.0)
Hemoglobin: 9.4 g/dL — ABNORMAL LOW (ref 13.0–17.0)
MCH: 27.9 pg (ref 26.0–34.0)
MCHC: 32.3 g/dL (ref 30.0–36.0)
MCV: 86.4 fL (ref 80.0–100.0)
Platelets: 104 10*3/uL — ABNORMAL LOW (ref 150–400)
RBC: 3.37 MIL/uL — ABNORMAL LOW (ref 4.22–5.81)
RDW: 15.9 % — ABNORMAL HIGH (ref 11.5–15.5)
WBC: 4.1 10*3/uL (ref 4.0–10.5)
nRBC: 0 % (ref 0.0–0.2)

## 2023-01-31 LAB — MAGNESIUM: Magnesium: 1.8 mg/dL (ref 1.7–2.4)

## 2023-01-31 NOTE — Progress Notes (Signed)
OT Cancellation Note  Patient Details Name: Trevor Murphy MRN: [id number] DOB: 09-10-1934   Cancelled Treatment:    Reason Eval/Treat Not Completed: Medical issues which prohibited therapy (RN requests rehab hold evaluations today secondary to pt with fluctuaitons in HR from tachy to brady (pt with SVT). RN would like increased cardiac stability prior to mobilization. OT to re-attempt to see pt for skilled OT eval as appropriate/available.)  Rosanne Sack "Orson Eva., OTR/L, MA Acute Rehab [phone]   Lendon Colonel 01/31/2023, 3:24 PM

## 2023-01-31 NOTE — Progress Notes (Signed)
PT Cancellation Note  Patient Details Name: Trevor Murphy MRN: [id number] DOB: 07-11-1935   Cancelled Treatment:    Reason Eval/Treat Not Completed: Medical issues which prohibited therapy this afternoon. RN asked therapies to hold evaluation due to fluctuations in HR from tachy (pt has SVT) to brady, and wanted more cardiac stability prior to mobilization. Will continue to follow and evaluate as able.   Vickki Muff, PT, DPT   Acute Rehabilitation Department Office [phone] Secure Chat Communication Preferred   Ronnie Derby 01/31/2023, 3:13 PM

## 2023-01-31 NOTE — Progress Notes (Signed)
PROGRESS NOTE  Trevor Murphy ZOX:[id number] DOB: 10-19-1934 DOA: 01/27/2023 PCP: [name], FNP   LOS: 4 days   Brief Narrative / Interim history: 88 y.o. male with medical history significant for CAD artery disease, chronic diastolic CHF, paroxysmal atrial fibrillation, liver cirrhosis, essential hypertension, gout, glaucoma, osteoarthritis, stage III chronic kidney disease and SVT, who presented to the emergency room with acute onset of fatigue and tiredness with anorexia over the last couple of days to fluids and solid food.  He had a brief episode of unconsciousness while he was in the ER for about 2 to 3 minutes.  He was admitted for prerenal AKI as well as syncopal episode in the setting of symptomatic bradycardia.  Cardiology following for bradycardia that appears to be related to hyperkalemia and is improving.  2D echocardiogram with no significant findings.  AKI slowly improving with IV fluid.  Noted to have tachycardia with SVT 6/21 requiring adenosine with improvement in rate control.  He has been seen by EP and now started back on Coreg.  He continues to have elevated heart rates, but per cardiology this will be acceptable.  Palliative care consult pending  Subjective / 24h Interval events: He is doing well this morning, denies any chest pain, denies any shortness of breath.  He denies any abdominal pain, no nausea or vomiting.  Assesement and Plan: Active Problems:   AKI (acute kidney injury) (HCC)   Syncope   Hyperkalemia   Chronic diastolic CHF (congestive heart failure) (HCC)   Coronary artery disease involving native coronary artery of native heart without angina pectoris   BPH (benign prostatic hyperplasia)   Alcoholic cirrhosis of liver with ascites (HCC)  Principal problem AKI on chronic kidney disease stage IV -patient's baseline creatinine appears to be around 1.5 - 1.8, this admission was elevated 2.5.  He has received IV fluids, and his creatinine has returned to his  baseline.  Discontinue IV fluids today.  Continue to avoid nephrotoxins, continue sodium bicarb tabs for underlying non-anion gap metabolic acidosis  Active problems  Syncope with bradycardia, now tachycardia - This is likely secondary to symptomatic bradycardia.  Cardiology consulted and evaluated patient while hospitalized.  He underwent a 2D echo which showed LVEF 60-65%, no WMA.  RV was normal.  His home Coreg of 6.25 was initially held but he developed tachycardia.  Cardiology restarted Coreg at a lower dose and he seems to be tolerating it well.  He does have slow tachycardia into the low 100s which is acceptable per cardiology.  Hyperkalemia -likely related due to his acute kidney injury,'s home spironolactone has been discontinued and will be held upon discharge.  Coronary artery disease involving native coronary artery of native heart without angina pectoris -this is stable, no chest pain   Chronic diastolic CHF (congestive heart failure) (HCC) -euvolemic   BPH (benign prostatic hyperplasia) - We will continue his Proscar.   Alcoholic cirrhosis of liver with ascites (HCC -continue lactulose, Aldactone will be held  Disposition -patient appears to have poor p.o. intake, appears to be declining and has chronic illness burden.  Palliative care consult is pending.  PT evaluation is pending.  Scheduled Meds:  brinzolamide  1 drop Both Eyes TID   carvedilol  3.125 mg Oral BID WC   ferrous sulfate  325 mg Oral Q breakfast   finasteride  5 mg Oral Daily   latanoprost  1 drop Both Eyes QHS   sodium bicarbonate  650 mg Oral BID   Continuous Infusions:  sodium chloride 100 mL/hr at 01/31/23 0313   PRN Meds:.acetaminophen **OR** acetaminophen, lactulose, magnesium hydroxide, ondansetron **OR** ondansetron (ZOFRAN) IV, traMADol, traZODone  Current Outpatient Medications  Medication Instructions   acetaminophen (TYLENOL) 1,000 mg, Oral, 2 times daily PRN   carvedilol (COREG) 6.25 mg,  Oral, 2 times daily with meals   dapagliflozin propanediol ([name]) 10 mg, Oral, Daily before breakfast   ferrous sulfate (FEROSUL) 325 (65 FE) MG tablet TAKE 1 TABLET(325 MG) BY MOUTH DAILY   finasteride (PROSCAR) 5 mg, Oral, Daily   furosemide (LASIX) 20 mg, Oral, Daily   lactulose (CHRONULAC) 10 g, Oral, 3 times daily PRN   LUMIGAN 0.01 % SOLN 1 drop, Both Eyes, Daily at bedtime   SIMBRINZA 1-0.2 % SUSP Follow up with your PCP on when to restart this medication.   spironolactone (ALDACTONE) 50 mg, Oral, Daily   traMADol (ULTRAM) 50 mg, Oral, Every 6 hours PRN    Diet Orders (From admission, onward)     Start     Ordered   01/29/23 1201  Diet Heart Room service appropriate? Yes; Fluid consistency: Thin  Diet effective now       Question Answer Comment  Room service appropriate? Yes   Fluid consistency: Thin      01/29/23 1201            DVT prophylaxis:    Lab Results  Component Value Date   PLT 104 (L) 01/31/2023      Code Status: DNR  Family Communication: no family at bedside   Status is: Inpatient Remains inpatient appropriate because: GOC pending  Level of care: Telemetry Medical  Consultants:  Cardiology  Palliative   Objective: Vitals:   01/30/23 1638 01/30/23 2012 01/31/23 0539 01/31/23 0937  BP: 111/76 102/69 101/76 116/68  Pulse:  (!) 109 78 63  Resp: 18 18 17 18   Temp: 97.9 F (36.6 C) 97.8 F (36.6 C) 98.1 F (36.7 C) 98 F (36.7 C)  TempSrc: Oral Oral Oral Oral  SpO2: 99% 100% 99% 97%  Weight:      Height:        Intake/Output Summary (Last 24 hours) at 01/31/2023 1013 Last data filed at 01/31/2023 1478 Gross per 24 hour  Intake 4425.59 ml  Output 450 ml  Net 3975.59 ml   Wt Readings from Last 3 Encounters:  01/29/23 51.9 kg  12/08/22 62.1 kg  11/27/22 68.8 kg    Examination:  Constitutional: NAD Eyes: no scleral icterus ENMT: Mucous membranes are moist.  Neck: normal, supple Respiratory: clear to auscultation  bilaterally, no wheezing, no crackles.  Cardiovascular: Regular rate and rhythm, no murmurs / rubs / gallops.  Abdomen: non distended, no tenderness. Bowel sounds positive.  Musculoskeletal: no clubbing / cyanosis.   Data Reviewed: I have independently reviewed following labs and imaging studies   CBC Recent Labs  Lab 01/27/23 1749 01/28/23 0449 01/29/23 0317 01/30/23 0307 01/31/23 0257  WBC 4.5 4.2 3.3* 3.6* 4.1  HGB 12.3* 9.7* 9.8* 9.8* 9.4*  HCT 39.4 31.0* 30.8* 31.1* 29.1*  PLT 217 113* 100* 113* 104*  MCV 87.9 87.3 85.3 87.6 86.4  MCH 27.5 27.3 27.1 27.6 27.9  MCHC 31.2 31.3 31.8 31.5 32.3  RDW 15.3 15.1 15.1 15.4 15.9*  LYMPHSABS 1.1  --   --   --   --   MONOABS 0.4  --   --   --   --   EOSABS 0.4  --   --   --   --  BASOSABS 0.0  --   --   --   --     Recent Labs  Lab 01/27/23 1749 01/28/23 0449 01/29/23 0317 01/30/23 0307 01/31/23 0257  NA 128* 133* 137 141 138  K 6.4* 5.7* 5.1 5.0 4.6  CL 103 110 111 117* 118*  CO2 18* 17* 18* 16* 16*  GLUCOSE 188* 103* 121* 116* 110*  BUN 46* 43* 32* 24* 18  CREATININE 2.56* 2.20* 1.92* 1.58* 1.50*  CALCIUM 7.7* 7.5* 7.8* 7.4* 7.2*  MG 2.3  --  2.1 1.9 1.8  TSH 4.320  --   --   --   --   BNP 547.0*  --   --   --   --     ------------------------------------------------------------------------------------------------------------------ No results for input(s): "CHOL", "HDL", "LDLCALC", "TRIG", "CHOLHDL", "LDLDIRECT" in the last 72 hours.  Lab Results  Component Value Date   HGBA1C 5.4 04/20/2022   ------------------------------------------------------------------------------------------------------------------ No results for input(s): "TSH", "T4TOTAL", "T3FREE", "THYROIDAB" in the last 72 hours.  Invalid input(s): "FREET3"  Cardiac Enzymes No results for input(s): "CKMB", "TROPONINI", "MYOGLOBIN" in the last 168 hours.  Invalid input(s):  "CK" ------------------------------------------------------------------------------------------------------------------    Component Value Date/Time   BNP 547.0 (H) 01/27/2023 1749    CBG: Recent Labs  Lab 01/27/23 1818 01/29/23 1614  GLUCAP 130* 100*    No results found for this or any previous visit (from the past 240 hour(s)).   Radiology Studies: No results found.   Pamella Pert, MD, PhD [facility]  Between 7 am - 7 pm I am available, please contact me via Amion (for emergencies) or Securechat (non urgent messages)  Between 7 pm - 7 am I am not available, please contact night coverage MD/APP via Amion

## 2023-01-31 NOTE — Progress Notes (Signed)
Progress Note  Patient Name: Trevor Murphy Date of Encounter: 01/31/2023  Primary Cardiologist: Orbie Pyo, MD     Patient Profile     88 y.o. male admitted with syncope found to be profoundly bradycardic in the context of hypokalemia this, improved.  History of tachybradycardia syndrome and recurrent in-hospital SVT (probably AV nodal reentry) which dates back at least to 2017 [facility]) severe decompensated liver disease with thrombocytopenia, portal hypertension with secondary cachexia and ascites  Loop recorder in place has demonstrated infrequent pauses and infrequent tachycardia. Subjective  Without complaints  Inpatient Medications    Scheduled Meds:  brinzolamide  1 drop Both Eyes TID   carvedilol  3.125 mg Oral BID WC   ferrous sulfate  325 mg Oral Q breakfast   finasteride  5 mg Oral Daily   latanoprost  1 drop Both Eyes QHS   sodium bicarbonate  650 mg Oral BID   Continuous Infusions:  sodium chloride 100 mL/hr at 01/31/23 0313   PRN Meds: acetaminophen **OR** acetaminophen, lactulose, magnesium hydroxide, ondansetron **OR** ondansetron (ZOFRAN) IV, traMADol, traZODone   Vital Signs    Vitals:   01/30/23 1629 01/30/23 1638 01/30/23 2012 01/31/23 0539  BP:  111/76 102/69 101/76  Pulse: (!) 123  (!) 109 78  Resp:  18 18 17   Temp:  97.9 F (36.6 C) 97.8 F (36.6 C) 98.1 F (36.7 C)  TempSrc:  Oral Oral Oral  SpO2:  99% 100% 99%  Weight:      Height:        Intake/Output Summary (Last 24 hours) at 01/31/2023 0706 Last data filed at 01/31/2023 0600 Gross per 24 hour  Intake 4425.59 ml  Output 600 ml  Net 3825.59 ml    Filed Weights   01/27/23 1741 01/29/23 0600  Weight: 52.2 kg 51.9 kg    Telemetry    SVT.  Most of the night.  Heart rate about 105 110  ECG      Physical Exam  Cachectic lying rolled up in bed  HENT normal Clear Regular rate and rhythm, no murmurs or gallops Abd-soft   No Clubbing cyanosis edema Skin-warm and dry A &  Oriented  Grossly normal sensory and motor function   Labs    Chemistry Recent Labs  Lab 01/29/23 0317 01/30/23 0307 01/31/23 0257  NA 137 141 138  K 5.1 5.0 4.6  CL 111 117* 118*  CO2 18* 16* 16*  GLUCOSE 121* 116* 110*  BUN 32* 24* 18  CREATININE 1.92* 1.58* 1.50*  CALCIUM 7.8* 7.4* 7.2*  GFRNONAA 34* 43* 45*  ANIONGAP 8 8 4*      Hematology Recent Labs  Lab 01/29/23 0317 01/30/23 0307 01/31/23 0257  WBC 3.3* 3.6* 4.1  RBC 3.61* 3.55* 3.37*  HGB 9.8* 9.8* 9.4*  HCT 30.8* 31.1* 29.1*  MCV 85.3 87.6 86.4  MCH 27.1 27.6 27.9  MCHC 31.8 31.5 32.3  RDW 15.1 15.4 15.9*  PLT 100* 113* 104*     Cardiac EnzymesNo results for input(s): "TROPONINI" in the last 168 hours. No results for input(s): "TROPIPOC" in the last 168 hours.   BNP Recent Labs  Lab 01/27/23 1749  BNP 547.0*      DDimer No results for input(s): "DDIMER" in the last 168 hours.   Radiology    No results found.  Cardiac Studies     Assessment & Plan    Tachy-brady syndrome H/o SVT (?AVNRT)  Long history of mostly asymptomatic sinus bradycardia, into  30s   AKI   Hyperkalemia  Alcoholic cirrhosis of liver with ascites   Thrombocytopenia   Cachexia   LV function normal    Our approach will remain nonaggressive and nonprocedural  Given the paucity of symptoms we will allow him to remain in his slow tachycardia.  Continue his carvedilol at low-dose  Ambulate as necessary, can be discharged from our point of view.  [facility] will sign off.   Medication Recommendations:  (As above)  Other recommendations (labs, testing, etc):    Follow up as an outpatient: Scheduled 7/2    For questions or updates, please contact [facility] Please consult [email or web address] for contact info under Cardiology/STEMI.      Signed, Sherryl Manges, MD  01/31/2023, 7:06 AM

## 2023-01-31 NOTE — Consult Note (Signed)
Consultation Note Date: 01/31/2023   Patient Name: Trevor Murphy  DOB: [birth year]  MRN: [id number]  Age / Sex: 88 y.o., male  PCP: Trevor Felts, FNP Referring Physician: Leatha Gilding, MD  Reason for Consultation: Establishing goals of care  HPI/Patient Profile: 88 y.o. male   admitted on 01/27/2023 with  past medical history significant for CAD artery disease, chronic diastolic CHF, paroxysmal atrial fibrillation, liver cirrhosis, essential hypertension, gout, glaucoma, osteoarthritis, stage III chronic kidney disease and SVT, who presented to the emergency room with acute onset of fatigue and tiredness with anorexia over the last couple of days to fluids and solid food.    He had a brief episode of unconsciousness while he was in the ER for about 2 to 3 minutes.  He was admitted for prerenal AKI as well as syncopal episode in the setting of symptomatic bradycardia.    Cardiology following for bradycardia that appears to be related to hyperkalemia and is improving.  2D echocardiogram with no significant findings.  AKI slowly improving with IV fluid.  Noted to have tachycardia with SVT 6/21 requiring adenosine with improvement in rate control.  He has been seen by EP and now started back on Coreg.  He continues to have elevated heart rates, but per cardiology this will be acceptable.    Patient is high risk for decompensation secondary to multiple co-morbidities  Patient and family face treatment option decisions, advanced directive decisions and anticipatory care needs.     Clinical Assessment and Goals of Care:  This NP Trevor Murphy reviewed medical records, received report from team, assessed the patient and then meet at the patient's bedside and then spoke by telephone with his son and sister/ Trevor Murphy to discuss diagnosis, prognosis, GOC, EOL wishes disposition and options.   Concept of  Palliative Care was introduced as specialized medical care for people and their families living with serious illness.  If focuses on providing relief from the symptoms and stress of a serious illness.  The goal is to improve quality of life for both the patient and the family.  Values and goals of care important to patient and family were attempted to be elicited.  Patient was pleasant and interactive today.  He shares his love and appreciation for his family with whom he lives.  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  He also worked at [employer] with [employer] offered on patient's multiple comorbidities specific to tachybradycardia syndrome, SVT, decompensated liver disease with thrombocytopenia, overall failure to thrive.  A  discussion was had today regarding advanced directives, concepts specific to code status, artifical feeding and hydration, continued IV antibiotics and rehospitalization was had.    The difference between a aggressive medical intervention path  and a palliative comfort care path for this patient at this time was had.     MOST form introduced     Questions and concerns addressed.   Family meeting is set for tomorrow at noon with patient's son and  sister    PMT will continue to support holistically.             HCPOA/Trevor Murphy and Trevor Murphy    SUMMARY OF RECOMMENDATIONS    Code Status/Advance Care Planning: DNR   Palliative Prophylaxis:  Delirium Protocol and Frequent Pain Assessment  Additional Recommendations (Limitations, Scope, Preferences): Avoid Hospitalization  Psycho-social/Spiritual:  Desire for further Chaplaincy support:no   Prognosis:  Unable to determine  Discharge Planning: To Be Determined      Primary Diagnoses: Present on Admission:  AKI (acute kidney injury) (HCC)  Chronic diastolic CHF (congestive heart failure) (HCC)  Coronary artery disease involving native coronary artery of  native heart without angina pectoris   I have reviewed the medical record, interviewed the patient and family, and examined the patient. The following aspects are pertinent.  Past Medical History:  Diagnosis Date   Arthritis    CAD (coronary artery disease)    a. Cath 08/2009: minimal nonobstructive disease (25% LAD). b. Lexiscan nuc 6/215: EF 54%, no ischemia or infarction.   Chronic diastolic CHF (congestive heart failure) (HCC)    a. Echo 2013: EF 55-60%, mild AI, mild MR. b. Echo 5/15 with EF 60-65%, no WMA, mild LVH, mild AI.   CKD (chronic kidney disease) stage 3, GFR 30-59 ml/min (HCC) 07/26/2012   a. Felt likely due to HTN.   CVA (cerebral infarction) 07/28/2012   Glaucoma    Gout    H/O umbilical hernia repair    Headache(784.0)    " WHEN MY BLOOD PRESSURE IS UP"   History of acute renal failure    History of alcohol abuse    HTN (hypertension)    Hypertension    a. Patent renal arteries 2005.   Noncompliance    OA (osteoarthritis)    Other and unspecified hyperlipidemia    Poor circulation    Sinus bradycardia    SVT (supraventricular tachycardia)    a. Possible AVNRT.  Got adenosine from EMS in 2015 for SVT.  During 5/15 hospitalization had episode of short R-P tachycardia (possible AVNRT) terminated by adenosine.  b. 04/2016 - recurrent tachycardia (SVT vs. AVNRT), again terminated by Adenosine   Visual impairment    Social History   Socioeconomic History   Marital status: Married    Spouse name: Not on file   Number of children: Not on file   Years of education: Not on file   Highest education level: Not on file  Occupational History   Occupation: retired  Tobacco Use   Smoking status: Never   Smokeless tobacco: Never  Vaping Use   Vaping Use: Never used  Substance and Sexual Activity   Alcohol use: No    Alcohol/week: 1.0 standard drink of alcohol    Types: 1 Cans of beer per week    Comment: h/o heavy use, quit in 2017   Drug use: No   Sexual  activity: Not Currently  Other Topics Concern   Not on file  Social History Narrative   Not on file   Social Determinants of Health   Financial Resource Strain: Low Risk  (10/21/2022)   Overall Financial Resource Strain (CARDIA)    Difficulty of Paying Living Expenses: Not hard at all  Food Insecurity: No Food Insecurity (11/30/2022)   Hunger Vital Sign    Worried About Running Out of Food in the Last Year: Never true    Ran Out of Food in the Last Year: Never true  Transportation Needs: No Transportation  Needs (11/30/2022)   PRAPARE - Administrator, Civil Service (Medical): No    Lack of Transportation (Non-Medical): No  Physical Activity: Inactive (10/21/2022)   Exercise Vital Sign    Days of Exercise per Week: 0 days    Minutes of Exercise per Session: 0 min  Stress: No Stress Concern Present (10/21/2022)   [employer] of Occupational Health - Occupational Stress Questionnaire    Feeling of Stress : Not at all  Social Connections: Moderately Isolated (04/15/2021)   Social Connection and Isolation Panel [NHANES]    Frequency of Communication with Friends and Family: More than three times a week    Frequency of Social Gatherings with Friends and Family: More than three times a week    Attends Religious Services: More than 4 times per year    Active Member of [employer] or Organizations: No    Attends Banker Meetings: Never    Marital Status: Widowed   Family History  Problem Relation Age of Onset   CVA Mother    Hypertension Brother    Hypertension Sister    Hypertension Brother    Stroke Sister    Heart attack Neg Hx    Scheduled Meds:  brinzolamide  1 drop Both Eyes TID   carvedilol  3.125 mg Oral BID WC   ferrous sulfate  325 mg Oral Q breakfast   finasteride  5 mg Oral Daily   latanoprost  1 drop Both Eyes QHS   sodium bicarbonate  650 mg Oral BID   Continuous Infusions:  sodium chloride 100 mL/hr at 01/31/23 1321   PRN  Meds:.acetaminophen **OR** acetaminophen, lactulose, magnesium hydroxide, ondansetron **OR** ondansetron (ZOFRAN) IV, traMADol, traZODone Medications Prior to Admission:  Prior to Admission medications   Medication Sig Start Date End Date Taking? Authorizing Provider  acetaminophen (TYLENOL) 500 MG tablet Take 1,000 mg by mouth 2 (two) times daily as needed for mild pain or moderate pain (leg pain).   Yes [provider]  carvedilol (COREG) 6.25 MG tablet Take 1 tablet (6.25 mg total) by mouth 2 (two) times daily with a meal. 11/27/22  Yes Almon Hercules, MD  dapagliflozin propanediol ([name]) 10 MG TABS tablet Take 1 tablet (10 mg total) by mouth daily before breakfast. 11/18/22  Yes Trevor Felts, FNP  ferrous sulfate (FEROSUL) 325 (65 FE) MG tablet TAKE 1 TABLET(325 MG) BY MOUTH DAILY Patient taking differently: Take 325 mg by mouth daily. 11/09/22  Yes Trevor Felts, FNP  finasteride (PROSCAR) 5 MG tablet Take 5 mg by mouth daily. 03/09/22  Yes [provider]  furosemide (LASIX) 20 MG tablet Take 1 tablet (20 mg total) by mouth daily. 11/27/22  Yes Gonfa, Taye T, MD  LUMIGAN 0.01 % SOLN Place 1 drop into both eyes at bedtime. 08/28/19  Yes [provider]  SIMBRINZA 1-0.2 % SUSP Follow up with your PCP on when to restart this medication. Patient taking differently: Place 1 drop into both eyes 3 (three) times daily. 06/09/22  Yes Narda Bonds, MD  spironolactone (ALDACTONE) 50 MG tablet Take 1 tablet (50 mg total) by mouth daily. 09/29/22  Yes Lewie Chamber, MD  lactulose (CHRONULAC) 10 GM/15ML solution Take 15 mLs (10 g total) by mouth 3 (three) times daily as needed for mild constipation. Patient not taking: Reported on 01/27/2023 11/27/22   Almon Hercules, MD  traMADol (ULTRAM) 50 MG tablet Take 1 tablet (50 mg total) by mouth every 6 (six) hours as needed  for moderate pain. Patient not taking: Reported on 01/27/2023 11/27/22   Barnetta Chapel, PA-C   No Known  Allergies Review of Systems  Respiratory:  Negative for apnea.     Physical Exam  Vital Signs: BP 116/68 (BP Location: Right Arm)   Pulse 63   Temp 98 F (36.7 C) (Oral)   Resp 18   Ht 5\' 10"  (1.778 m)   Wt 51.9 kg   SpO2 97%   BMI 16.42 kg/m  Pain Scale: 0-10   Pain Score: 0-No pain   SpO2: SpO2: 97 % O2 Device:SpO2: 97 % O2 Flow Rate: .O2 Flow Rate (L/min): 3 L/min  IO: Intake/output summary:  Intake/Output Summary (Last 24 hours) at 01/31/2023 1550 Last data filed at 01/31/2023 1300 Gross per 24 hour  Intake 4545.59 ml  Output 550 ml  Net 3995.59 ml    LBM: Last BM Date : 01/30/23 Baseline Weight: Weight: 52.2 kg Most recent weight: Weight: 51.9 kg     Palliative Assessment/Data:   50 %    PMT will continue to support holistically  Time: 75 minutes      discussed with attending via secure chat  Signed by: Trevor Creed, NP   Please contact Palliative Medicine Team phone at [phone] for questions and concerns.  For individual provider: See Loretha Stapler

## 2023-02-01 ENCOUNTER — Ambulatory Visit: Payer: 59

## 2023-02-01 DIAGNOSIS — K7031 Alcoholic cirrhosis of liver with ascites: Secondary | ICD-10-CM

## 2023-02-01 DIAGNOSIS — I471 Supraventricular tachycardia, unspecified: Secondary | ICD-10-CM | POA: Diagnosis not present

## 2023-02-01 DIAGNOSIS — I639 Cerebral infarction, unspecified: Secondary | ICD-10-CM | POA: Diagnosis not present

## 2023-02-01 DIAGNOSIS — R627 Adult failure to thrive: Secondary | ICD-10-CM

## 2023-02-01 DIAGNOSIS — R001 Bradycardia, unspecified: Secondary | ICD-10-CM | POA: Diagnosis not present

## 2023-02-01 DIAGNOSIS — I5032 Chronic diastolic (congestive) heart failure: Secondary | ICD-10-CM | POA: Diagnosis not present

## 2023-02-01 LAB — CUP PACEART REMOTE DEVICE CHECK
Date Time Interrogation Session: 20240623230132
Implantable Pulse Generator Implant Date: 20201014

## 2023-02-01 NOTE — Consult Note (Signed)
   [facility] CM Inpatient Consult   02/01/2023  APOLLO TIMOTHY November 22, 1934 [id number]  [facility] [[facility]]  Accountable Care Organization [ACO] Patient: [employer] Medicare  Primary Care Provider:  [name], FNP with [facility] which is listed to provide the transition of care follow up.  Patient is currently active with [facility] [[facility] Care Management for chronic disease management services.  Patient has been engaged by a Airline pilot and a Child psychotherapist on behalf of [employer].  Our community based plan of care has focused on disease management and community resource support.  Chart reviewed for ongoing follow up needs for care coordination. Patient rounds, patient up in recliner, no family at bedside.  Reviewed for needs. Patient will receive a post hospital call and will be evaluated for assessments and disease process education.    Plan:  Continue to follow for disposition and update RN CC and Social worker on behalf of [facility] when appropriate.  Of note, [facility] Care Management services does not replace or interfere with any services that are needed or arranged by inpatient [facility] care management team.   For additional questions or referrals please contact:  Charlesetta Shanks, RN BSN CCM [facility]  [phone] business mobile phone Toll free office [phone]  *Concierge Line  [phone] Fax number: [phone] [email or web address]

## 2023-02-01 NOTE — Evaluation (Signed)
Occupational Therapy Evaluation Patient Details Name: Trevor Murphy MRN: [id number] DOB: 03/16/1935 Today's Date: 02/01/2023   History of Present Illness 88 y/o male who presents with acute onset of fatigue. Admitted for AKI and syncopal episode in the setting of bradycardia. PMHx: chronic diastolic CHF (EF 40-98%), CAD, CKD stage IIIb, PAF, bradycardia, cryptogenic stroke s/p ILR, hepatic cirrhosis with thrombocytopenia, severe mitral regurgitation, glaucoma.   Clinical Impression   Pt evaluated s/p above admission list. Pt reports modified independence with ADLs and functional mobility with use of rollator, receives assistance from family for IADLs at baseline. Pt presents this session with generalized weakness, decreased balance, and low vision requiring mod verbal/tactile cues to navigate environment. Pt currently requires setup A for seated UB ADLs and mod A for LB ADLs. Pt completed STS transfer from EOB using RW with min guard A and room level mobility with min A for obstacle navigation. Pt would benefit from continued acute OT services to maximize functional independence and facilitate transition to pt's natural home environment with continued [facility] OT services and confirmed 24/7 family support.       Recommendations for follow up therapy are one component of a multi-disciplinary discharge planning process, led by the attending physician.  Recommendations may be updated based on patient status, additional functional criteria and insurance authorization.   Assistance Recommended at Discharge Frequent or constant Supervision/Assistance  Patient can return home with the following A little help with walking and/or transfers;A little help with bathing/dressing/bathroom;Assistance with cooking/housework;Direct supervision/assist for medications management;Direct supervision/assist for financial management;Assist for transportation;Help with stairs or ramp for entrance    Functional Status  Assessment  Patient has had a recent decline in their functional status and demonstrates the ability to make significant improvements in function in a reasonable and predictable amount of time.  Equipment Recommendations  None recommended by OT    Recommendations for Other Services       Precautions / Restrictions Precautions Precautions: Fall Precaution Comments: blind in L eye, limited vision in R eye Restrictions Weight Bearing Restrictions: No      Mobility Bed Mobility Overal bed mobility: Needs Assistance Bed Mobility: Supine to Sit     Supine to sit: Supervision, HOB elevated          Transfers Overall transfer level: Needs assistance Equipment used: Rolling walker (2 wheels) Transfers: Sit to/from Stand Sit to Stand: Min guard           General transfer comment: STS transfer from EOB using RW with min guard A. Room level mobility using RW with min A for obstacle navigation 2/2 low vision.      Balance Overall balance assessment: Needs assistance Sitting-balance support: No upper extremity supported, Feet supported Sitting balance-Leahy Scale: Fair Sitting balance - Comments: sitting EOB   Standing balance support: Bilateral upper extremity supported, Reliant on assistive device for balance Standing balance-Leahy Scale: Poor Standing balance comment: BUE support on RW for stability                           ADL either performed or assessed with clinical judgement   ADL Overall ADL's : Needs assistance/impaired Eating/Feeding: Set up;Sitting   Grooming: Set up;Sitting;Wash/dry hands   Upper Body Bathing: Set up;Sitting   Lower Body Bathing: Moderate assistance;Sit to/from stand   Upper Body Dressing : Set up;Sitting   Lower Body Dressing: Moderate assistance;Sit to/from stand   Toilet Transfer: Rolling walker (2 wheels);Minimal assistance;Comfort height toilet;Ambulation Toilet  Transfer Details (indicate cue type and reason): BSC  placed over toilet, mod verbal/tactile cues required to navigate novel envionment 2/2 low vision Toileting- Clothing Manipulation and Hygiene: Min guard;Sit to/from stand       Functional mobility during ADLs: Minimal assistance;Rolling walker (2 wheels) General ADL Comments: limited secondary to low vision and generalized weakness     Vision Baseline Vision/History: 3 Glaucoma (in L eye, partial vision in R eye; history of glaucoma) Ability to See in Adequate Light: 2 Moderately impaired Patient Visual Report: Other (comment) (blind in L eye, partial vision in R eye) Vision Assessment?: Vision impaired- to be further tested in functional context Additional Comments: Requires verbal/tactile cues to navigate environment 2/2 low vision.     Perception Perception Perception Tested?: No   Praxis Praxis Praxis tested?: Not tested    Pertinent Vitals/Pain Pain Assessment Pain Assessment: No/denies pain     Hand Dominance Right   Extremity/Trunk Assessment Upper Extremity Assessment Upper Extremity Assessment: Generalized weakness   Lower Extremity Assessment Lower Extremity Assessment: Defer to PT evaluation   Cervical / Trunk Assessment Cervical / Trunk Assessment: Kyphotic   Communication Communication Communication: HOH   Cognition Arousal/Alertness: Awake/alert Behavior During Therapy: WFL for tasks assessed/performed Overall Cognitive Status: Within Functional Limits for tasks assessed                                 General Comments: A+O x4, pt and ageeable to session. Requires verbal/tactile cues for room navigation 2/2 low vision.     General Comments  HR at rest: 118, HR post activity: 126    Exercises     Shoulder Instructions      Home Living Family/patient expects to be discharged to:: Private residence Living Arrangements: Alone Available Help at Discharge: Family;Available 24 hours/day Type of Home: Apartment Home Access: Level  entry     Home Layout: One level     Bathroom Shower/Tub: Chief Strategy Officer: Standard Bathroom Accessibility: Yes   Home Equipment: Tub bench;Toilet riser;Grab bars - toilet;Grab bars - tub/shower;Wheelchair - Counsellor (4 wheels)   Additional Comments: Son, nephew and sister available 24/7      Prior Functioning/Environment Prior Level of Function : Needs assist             Mobility Comments: walks with rollator in home, wheelchair for going to MD appointments ADLs Comments: Pt is independent with ADLs. Family assists daily with meals and reports his sister assists with med mgmt, utilizes pillbox.        OT Problem List: Decreased strength;Decreased activity tolerance;Impaired balance (sitting and/or standing);Impaired vision/perception      OT Treatment/Interventions: Self-care/ADL training;Energy conservation;DME and/or AE instruction;Therapeutic activities;Visual/perceptual remediation/compensation;Patient/family education;Balance training    OT Goals(Current goals can be found in the care plan section) Acute Rehab OT Goals Patient Stated Goal: to go home OT Goal Formulation: With patient Time For Goal Achievement: 02/15/23 Potential to Achieve Goals: Good ADL Goals Pt Will Perform Lower Body Dressing: with modified independence;sit to/from stand Pt Will Transfer to Toilet: with modified independence;ambulating;regular height toilet Pt Will Perform Toileting - Clothing Manipulation and hygiene: with modified independence;sit to/from stand Additional ADL Goal #1: Pt will safely navigate environment during ADLs using LRAD with modified independence  OT Frequency: Min 2X/week    Co-evaluation              AM-PAC OT "6 Clicks" Daily Activity  Outcome Measure Help from another person eating meals?: A Little Help from another person taking care of personal grooming?: A Little Help from another person toileting, which includes  using toliet, bedpan, or urinal?: A Little Help from another person bathing (including washing, rinsing, drying)?: A Lot Help from another person to put on and taking off regular upper body clothing?: A Little Help from another person to put on and taking off regular lower body clothing?: A Lot 6 Click Score: 16   End of Session Equipment Utilized During Treatment: Gait belt;Rolling walker (2 wheels) Nurse Communication: Mobility status  Activity Tolerance: Patient tolerated treatment well Patient left: in chair;with call bell/phone within reach;with chair alarm set  OT Visit Diagnosis: Unsteadiness on feet (R26.81);Muscle weakness (generalized) (M62.81);Low vision, both eyes (H54.2)                Time: 6213-0865 OT Time Calculation (min): 20 min Charges:  OT General Charges $OT Visit: 1 Visit OT Evaluation $OT Eval Moderate Complexity: 1 Mod  Sherley Bounds, OTS Acute Rehabilitation Services Office [phone] Secure Chat Communication Preferred   Sherley Bounds 02/01/2023, 2:05 PM

## 2023-02-01 NOTE — Progress Notes (Signed)
PROGRESS NOTE  Trevor Murphy GNF:[id number] DOB: May 10, 1935 DOA: 01/27/2023 PCP: [name], FNP   LOS: 5 days   Brief Narrative / Interim history: 88 y.o. male with medical history significant for CAD artery disease, chronic diastolic CHF, paroxysmal atrial fibrillation, liver cirrhosis, essential hypertension, gout, glaucoma, osteoarthritis, stage III chronic kidney disease and SVT, who presented to the emergency room with acute onset of fatigue and tiredness with anorexia over the last couple of days to fluids and solid food.  He had a brief episode of unconsciousness while he was in the ER for about 2 to 3 minutes.  He was admitted for prerenal AKI as well as syncopal episode in the setting of symptomatic bradycardia.  Cardiology following for bradycardia that appears to be related to hyperkalemia and is improving.  2D echocardiogram with no significant findings.  AKI slowly improving with IV fluid.  Noted to have tachycardia with SVT 6/21 requiring adenosine with improvement in rate control.  He has been seen by EP and now started back on Coreg.  He continues to have elevated heart rates, but per cardiology this will be acceptable.  Palliative care consult pending  Subjective / 24h Interval events: No chest pain, no shortness of breath, no palpitations.  Assesement and Plan: Active Problems:   AKI (acute kidney injury) (HCC)   Syncope   Hyperkalemia   Chronic diastolic CHF (congestive heart failure) (HCC)   Coronary artery disease involving native coronary artery of native heart without angina pectoris   BPH (benign prostatic hyperplasia)   Alcoholic cirrhosis of liver with ascites (HCC)  Principal problem AKI on chronic kidney disease stage IV -patient's baseline creatinine appears to be around 1.5 - 1.8, this admission was elevated 2.5.  He has received IV fluids, and his creatinine has returned to his baseline.  Discontinue IV fluids today.  Continue to avoid nephrotoxins, continue  sodium bicarb tabs for underlying non-anion gap metabolic acidosis  Active problems  Syncope with bradycardia, now tachycardia - This is likely secondary to symptomatic bradycardia.  Cardiology consulted and evaluated patient while hospitalized.  He underwent a 2D echo which showed LVEF 60-65%, no WMA.  RV was normal.  His home Coreg of 6.25 was initially held but he developed tachycardia.  Cardiology restarted Coreg at a lower dose and he seems to be tolerating it well.  He does have slow tachycardia into the low 100s which is acceptable per cardiology.  Hyperkalemia -likely related due to his acute kidney injury,'s home spironolactone has been discontinued and will be held upon discharge.  Coronary artery disease involving native coronary artery of native heart without angina pectoris -this is stable, no chest pain   Chronic diastolic CHF (congestive heart failure) (HCC) -euvolemic   BPH (benign prostatic hyperplasia) - We will continue his Proscar.   Alcoholic cirrhosis of liver with ascites (HCC -continue lactulose, Aldactone will be held  Disposition -patient appears to have poor p.o. intake, appears to be declining and has chronic illness burden.  Palliative care consult is pending, family meeting today at noon.  PT evaluation is pending.  Scheduled Meds:  brinzolamide  1 drop Both Eyes TID   carvedilol  3.125 mg Oral BID WC   ferrous sulfate  325 mg Oral Q breakfast   finasteride  5 mg Oral Daily   latanoprost  1 drop Both Eyes QHS   sodium bicarbonate  650 mg Oral BID   Continuous Infusions:  sodium chloride 100 mL/hr at 01/31/23 2341   PRN  Meds:.acetaminophen **OR** acetaminophen, lactulose, magnesium hydroxide, ondansetron **OR** ondansetron (ZOFRAN) IV, traMADol, traZODone  Current Outpatient Medications  Medication Instructions   acetaminophen (TYLENOL) 1,000 mg, Oral, 2 times daily PRN   carvedilol (COREG) 6.25 mg, Oral, 2 times daily with meals   dapagliflozin  propanediol ([name]) 10 mg, Oral, Daily before breakfast   ferrous sulfate (FEROSUL) 325 (65 FE) MG tablet TAKE 1 TABLET(325 MG) BY MOUTH DAILY   finasteride (PROSCAR) 5 mg, Oral, Daily   furosemide (LASIX) 20 mg, Oral, Daily   lactulose (CHRONULAC) 10 g, Oral, 3 times daily PRN   LUMIGAN 0.01 % SOLN 1 drop, Both Eyes, Daily at bedtime   SIMBRINZA 1-0.2 % SUSP Follow up with your PCP on when to restart this medication.   spironolactone (ALDACTONE) 50 mg, Oral, Daily   traMADol (ULTRAM) 50 mg, Oral, Every 6 hours PRN    Diet Orders (From admission, onward)     Start     Ordered   01/29/23 1201  Diet Heart Room service appropriate? Yes; Fluid consistency: Thin  Diet effective now       Question Answer Comment  Room service appropriate? Yes   Fluid consistency: Thin      01/29/23 1201            DVT prophylaxis:    Lab Results  Component Value Date   PLT 104 (L) 01/31/2023      Code Status: DNR  Family Communication: no family at bedside   Status is: Inpatient Remains inpatient appropriate because: GOC pending  Level of care: Telemetry Medical  Consultants:  Cardiology  Palliative   Objective: Vitals:   02/01/23 0455 02/01/23 0757 02/01/23 0957 02/01/23 0959  BP: 109/80 102/66 102/75   Pulse: (!) 114 (!) 117 92 (!) 116  Resp: 16 20    Temp: 98.1 F (36.7 C) 97.7 F (36.5 C)    TempSrc: Oral Oral    SpO2: 97% 98%  93%  Weight:      Height:        Intake/Output Summary (Last 24 hours) at 02/01/2023 1137 Last data filed at 02/01/2023 0600 Gross per 24 hour  Intake 2637.73 ml  Output 600 ml  Net 2037.73 ml    Wt Readings from Last 3 Encounters:  01/29/23 51.9 kg  12/08/22 62.1 kg  11/27/22 68.8 kg    Examination:  Constitutional: NAD Respiratory: CTA Cardiovascular: RRR  Data Reviewed: I have independently reviewed following labs and imaging studies   CBC Recent Labs  Lab 01/27/23 1749 01/28/23 0449 01/29/23 0317 01/30/23 0307  01/31/23 0257  WBC 4.5 4.2 3.3* 3.6* 4.1  HGB 12.3* 9.7* 9.8* 9.8* 9.4*  HCT 39.4 31.0* 30.8* 31.1* 29.1*  PLT 217 113* 100* 113* 104*  MCV 87.9 87.3 85.3 87.6 86.4  MCH 27.5 27.3 27.1 27.6 27.9  MCHC 31.2 31.3 31.8 31.5 32.3  RDW 15.3 15.1 15.1 15.4 15.9*  LYMPHSABS 1.1  --   --   --   --   MONOABS 0.4  --   --   --   --   EOSABS 0.4  --   --   --   --   BASOSABS 0.0  --   --   --   --      Recent Labs  Lab 01/27/23 1749 01/28/23 0449 01/29/23 0317 01/30/23 0307 01/31/23 0257  NA 128* 133* 137 141 138  K 6.4* 5.7* 5.1 5.0 4.6  CL 103 110 111 117* 118*  CO2 18*  17* 18* 16* 16*  GLUCOSE 188* 103* 121* 116* 110*  BUN 46* 43* 32* 24* 18  CREATININE 2.56* 2.20* 1.92* 1.58* 1.50*  CALCIUM 7.7* 7.5* 7.8* 7.4* 7.2*  MG 2.3  --  2.1 1.9 1.8  TSH 4.320  --   --   --   --   BNP 547.0*  --   --   --   --      ------------------------------------------------------------------------------------------------------------------ No results for input(s): "CHOL", "HDL", "LDLCALC", "TRIG", "CHOLHDL", "LDLDIRECT" in the last 72 hours.  Lab Results  Component Value Date   HGBA1C 5.4 04/20/2022   ------------------------------------------------------------------------------------------------------------------ No results for input(s): "TSH", "T4TOTAL", "T3FREE", "THYROIDAB" in the last 72 hours.  Invalid input(s): "FREET3"  Cardiac Enzymes No results for input(s): "CKMB", "TROPONINI", "MYOGLOBIN" in the last 168 hours.  Invalid input(s): "CK" ------------------------------------------------------------------------------------------------------------------    Component Value Date/Time   BNP 547.0 (H) 01/27/2023 1749    CBG: Recent Labs  Lab 01/27/23 1818 01/29/23 1614  GLUCAP 130* 100*     No results found for this or any previous visit (from the past 240 hour(s)).   Radiology Studies: No results found.   Pamella Pert, MD, PhD [facility]  Between 7 am - 7  pm I am available, please contact me via Amion (for emergencies) or Securechat (non urgent messages)  Between 7 pm - 7 am I am not available, please contact night coverage MD/APP via Amion

## 2023-02-01 NOTE — Evaluation (Signed)
Physical Therapy Evaluation Patient Details Name: Trevor Murphy MRN: [id number] DOB: 12/31/1934 Today's Date: 02/01/2023  History of Present Illness  Patient is an 88 y.o. male presented to the ED with acute onset of fatigue with anorexia for ~2 days PTA. Pt with 2-3 minute episode of unconsciousness in the ED. He was admitted for prerenal AKI as well as syncopal episode in the setting of symptomatic bradycardia. Cardiology following for bradycardia that appears to be related to hyperkalemia and is improving. PMH significant for CAD, Diastolic CHF, CKD III, CVA, hx of Renal failure, HTN, SVT, Visual impairment (L sided blindness), a-fib, liver cirrhosis, gout, glaucoma, OA., s/p inguinal hernia repair (2024).   Clinical Impression  At the time of evaluation, patient was asleep in hospital bed; awake and alert throughout the session. Prior to admission, pt required assistance with functional mobility. Able to ambulate household distances with rollator but requires wheelchair for community ambulation. He is supported by multiple family members who will provide 24 hr support when needed. Currently the patient is able to perform bed mobility without physical assistance but with close supervision support. Pt is able transfer and ambulate with min guard assistance throughout session; intermittent cues provided for posture and RW management. Recommend next session to progress gait training. Anticipating patient will discharge with continued rehab services in order to improve functional mobility, independence, and improve safety.      Recommendations for follow up therapy are one component of a multi-disciplinary discharge planning process, led by the attending physician.  Recommendations may be updated based on patient status, additional functional criteria and insurance authorization.  Follow Up Recommendations       Assistance Recommended at Discharge Frequent or constant Supervision/Assistance  Patient  can return home with the following  A little help with walking and/or transfers;A little help with bathing/dressing/bathroom;Assist for transportation;Help with stairs or ramp for entrance;Assistance with cooking/housework;Assistance with feeding    Equipment Recommendations None recommended by PT  Recommendations for Other Services       Functional Status Assessment Patient has had a recent decline in their functional status and demonstrates the ability to make significant improvements in function in a reasonable and predictable amount of time.     Precautions / Restrictions Precautions Precautions: Fall Precaution Comments: blind in L eye, limited vision in R eye Restrictions Weight Bearing Restrictions: No      Mobility  Bed Mobility Overal bed mobility: Needs Assistance Bed Mobility: Supine to Sit     Supine to sit: Supervision, HOB elevated     General bed mobility comments: Performed without physical assistance with close supervision    Transfers Overall transfer level: Needs assistance Equipment used: None Transfers: Sit to/from Stand Sit to Stand: Min guard           General transfer comment: Able to transfer from [facility] without RW support and min guard assist. Forward trunk lean once upright; verbal cue provided to stand upright.    Ambulation/Gait Ambulation/Gait assistance: Min guard Gait Distance (Feet): 80 Feet Assistive device: Rolling walker (2 wheels) Gait Pattern/deviations: Step-to pattern, Decreased step length - right, Decreased step length - left, Decreased stride length, Trunk flexed Gait velocity: Decreased Gait velocity interpretation: <1.31 ft/sec, indicative of household ambulator   General Gait Details: Verbal cues provided for upright posture, RW proximity and turning. Presents with slight drift to left but still able to manage RW support.  Stairs            Psychologist, prison and probation services  Modified Rankin (Stroke Patients Only)        Balance Overall balance assessment: Needs assistance Sitting-balance support: No upper extremity supported, Feet supported Sitting balance-Leahy Scale: Fair Sitting balance - Comments: sitting EOB   Standing balance support: Reliant on assistive device for balance, During functional activity, Single extremity supported, Bilateral upper extremity supported Standing balance-Leahy Scale: Poor Standing balance comment: Able to support static upright posture using IV pole. BUE support on RW for functional mobility.                             Pertinent Vitals/Pain Pain Assessment Pain Assessment: No/denies pain    Home Living Family/patient expects to be discharged to:: Private residence Living Arrangements: Alone Available Help at Discharge: Family;Available 24 hours/day Type of Home: Apartment Home Access: Level entry       Home Layout: One level Home Equipment: Tub bench;Toilet riser;Grab bars - toilet;Grab bars - tub/shower;Wheelchair - Counsellor (4 wheels) Additional Comments: Son, nephew and sister available 24/7    Prior Function Prior Level of Function : Needs assist             Mobility Comments: walks with rollator in home, wheelchair for going to MD appointments ADLs Comments: Pt is independent with ADLs. Family assists daily with meals and reports his sister assists with med mgmt, utilizes pillbox.     Hand Dominance   Dominant Hand: Right    Extremity/Trunk Assessment   Upper Extremity Assessment Upper Extremity Assessment: Defer to OT evaluation    Lower Extremity Assessment Lower Extremity Assessment: Generalized weakness    Cervical / Trunk Assessment Cervical / Trunk Assessment: Kyphotic  Communication   Communication: HOH  Cognition Arousal/Alertness: Awake/alert Behavior During Therapy: WFL for tasks assessed/performed Overall Cognitive Status: Within Functional Limits for tasks assessed                                  General Comments: A+O x4, pt and ageeable to session. Requires verbal/tactile cues for room navigation 2/2 low vision.        General Comments General comments (skin integrity, edema, etc.):     Exercises     Assessment/Plan    PT Assessment Patient needs continued PT services  PT Problem List Decreased strength;Decreased range of motion;Decreased balance;Decreased activity tolerance;Decreased coordination;Decreased mobility;Decreased safety awareness       PT Treatment Interventions DME instruction;Gait training;Stair training;Functional mobility training;Therapeutic activities;Therapeutic exercise;Neuromuscular re-education    PT Goals (Current goals can be found in the Care Plan section)  Acute Rehab PT Goals Patient Stated Goal: Patient would like to return to home PT Goal Formulation: With patient Time For Goal Achievement: 02/15/23 Potential to Achieve Goals: Good    Frequency Min 3X/week     Co-evaluation               AM-PAC PT "6 Clicks" Mobility  Outcome Measure Help needed turning from your back to your side while in a flat bed without using bedrails?: None Help needed moving from lying on your back to sitting on the side of a flat bed without using bedrails?: None Help needed moving to and from a bed to a chair (including a wheelchair)?: None Help needed standing up from a chair using your arms (e.g., wheelchair or bedside chair)?: A Little Help needed to walk in hospital room?: A Little Help needed climbing 3-5 steps with  a railing? : A Lot 6 Click Score: 20    End of Session Equipment Utilized During Treatment: Gait belt Activity Tolerance: Patient tolerated treatment well Patient left: in chair;with call bell/phone within reach;with chair alarm set Nurse Communication: Mobility status PT Visit Diagnosis: Muscle weakness (generalized) (M62.81);Difficulty in walking, not elsewhere classified (R26.2)    Time: 0230-0253 PT  Time Calculation (min) (ACUTE ONLY): 23 min   Charges:   PT Evaluation $PT Eval Moderate Complexity: 1 Mod PT Treatments $Gait Training: 8-22 mins        Christene Lye, SPT Acute Rehabilitation Services [phone] Secure chat preferred    Christene Lye 02/01/2023, 3:37 PM

## 2023-02-01 NOTE — Progress Notes (Signed)
Patient ID: Trevor Murphy, male   DOB: [birth year], 88 y.o.   MRN: [id number]    Progress Note from the Palliative Medicine Team at [facility]   Patient Name: Trevor Murphy        Date: 02/01/2023 DOB: May 14, 1935  Age: 88 y.o. MRN#: [id number] Attending Physician: Leatha Gilding, MD Primary Care Physician: [name], FNP Admit Date: 01/27/2023    Extensive chart review has been completed prior to meeting with patient/family  including labs, vital signs, imaging, progress/consult notes, orders, medications and available advance directive documents.   This NP assessed patient at the bedside as a follow up to  yesterday's GOCs meeting and met with family as scheduled for continued conversation regarding next steps in plan of care.  I met with patient's son/Terry, his sister/[name] and his nephew Ethelene Browns.   Ethelene Browns lives in the home with Mr. Cremer and is his main caregiver.  Education offered on the seriousness of the patient's current medical situation specific to his cardiac disease, liver cirrhosis and overall failure to thrive.  Education offered on patient's high risk for decompensation  Plan of Care: -DNR/DNI    -Continue with all offered and available medical interventions to prolong life. -Discharge home with home health, patient and family are not interested in hospice services       I did offer education on hospice benefit; philosophy and eligibility.  Mr. Groene would be eligible for hospice benefit if interested.  Education offered on self-referral  MOST form introduced- Hard Choices left for review  Education offered today regarding  the importance of continued conversation with family and their  medical providers regarding overall plan of care and treatment options,  ensuring decisions are within the context of the patients values and GOCs.  Plan is discharged home tomorrow with home health services  Questions and concerns addressed   Discussed with Dr Dan Europe via  secure chat   Time:   75 minutes  Detailed review of medical records ( labs, imaging, vital signs), medically appropriate exam ( MS, skin, resp)   discussed with treatment team, counseling and education to patient, family, staff, documenting clinical information, medication management, coordination of care    Lorinda Creed NP  Palliative Medicine Team Team Phone # [phone] Pager [phone]

## 2023-02-02 DIAGNOSIS — I495 Sick sinus syndrome: Secondary | ICD-10-CM | POA: Insufficient documentation

## 2023-02-02 DIAGNOSIS — N179 Acute kidney failure, unspecified: Secondary | ICD-10-CM | POA: Diagnosis not present

## 2023-02-02 DIAGNOSIS — R55 Syncope and collapse: Secondary | ICD-10-CM | POA: Diagnosis not present

## 2023-02-02 DIAGNOSIS — R001 Bradycardia, unspecified: Secondary | ICD-10-CM | POA: Insufficient documentation

## 2023-02-02 HISTORY — DX: Bradycardia, unspecified: R00.1

## 2023-02-02 MED ORDER — TRAMADOL HCL 50 MG PO TABS: 50.0000 mg | ORAL_TABLET | Freq: Two times a day (BID) | ORAL | Status: DC | PRN

## 2023-02-02 MED ORDER — CARVEDILOL 3.125 MG PO TABS: 3.1250 mg | ORAL_TABLET | Freq: Two times a day (BID) | ORAL | 2 refills | Status: DC

## 2023-02-02 MED ORDER — SODIUM BICARBONATE 650 MG PO TABS: 650.0000 mg | ORAL_TABLET | Freq: Two times a day (BID) | ORAL | 0 refills | Status: AC

## 2023-02-02 NOTE — TOC Transition Note (Signed)
Transition of Care [facility]) - CM/SW Discharge Note   Patient Details  Name: Trevor Murphy MRN: [id number] Date of Birth: 12/28/1934  Transition of Care [facility]) CM/SW Contact:  Tom-Johnson, Hershal Coria, RN Phone Number: 02/02/2023, 4:20 PM   Clinical Narrative:     Patient is scheduled for discharge today.  Readmission Risk Assessment done. Home health info on AVS, outpatient f/u, hospital f/u and discharge instructions on AVS. Son, Trevor Murphy to transport at discharge.  No further TOC needs noted.       Final next level of care: Home w Home Health Services Barriers to Discharge: Barriers Resolved   Patient Goals and CMS Choice CMS Medicare.gov Compare Post Acute Care list provided to:: Patient Choice offered to / list presented to : Patient, Adult Children Trevor Murphy)  Discharge Placement                  Patient to be transferred to facility by: Son Name of family member notified: Trevor Murphy    Discharge Plan and Services Additional resources added to the After Visit Summary for                  DME Arranged: N/A DME Agency: NA       HH Arranged: PT, Disease Management, RN, OT, Nurse's Aide HH Agency: [facility] Date [facility] Agency Contacted: 02/02/23 Time HH Agency Contacted: 1610 Representative spoke with at [facility] Agency: Tresa Endo  Social Determinants of Health (SDOH) Interventions SDOH Screenings   Food Insecurity: No Food Insecurity (11/30/2022)  Housing: Low Risk  (11/20/2022)  Transportation Needs: No Transportation Needs (11/30/2022)  Utilities: Not At Risk (11/20/2022)  Depression (PHQ2-9): Low Risk  (10/21/2022)  Financial Resource Strain: Low Risk  (10/21/2022)  Physical Activity: Inactive (10/21/2022)  Social Connections: Moderately Isolated (04/15/2021)  Stress: No Stress Concern Present (10/21/2022)  Tobacco Use: Low Risk  (12/24/2022)     Readmission Risk Interventions    01/29/2023    4:30 PM 11/27/2022   10:22 AM 11/27/2022   10:21 AM  Readmission  Risk Prevention Plan  Transportation Screening Complete  Complete  PCP or Specialist Appt within 3-5 Days Complete Complete   HRI or Home Care Consult   Complete  Social Work Consult for Recovery Care Planning/Counseling Complete  Complete  Palliative Care Screening Not Applicable  Not Applicable  Medication Review Oceanographer) Referral to Pharmacy  Complete

## 2023-02-02 NOTE — Progress Notes (Signed)
Mobility Specialist Progress Note   02/02/23 1349  Mobility  Activity Refused mobility   Pt deferring mobility right now expressing they have not had a moment to rest today and would like to. Will F/U later this afternoon if time permits.    Frederico Hamman Mobility Specialist Please contact via SecureChat or  Rehab office at [phone]

## 2023-02-02 NOTE — Progress Notes (Signed)
Mobility Specialist Progress Note   02/02/23 1559  Mobility  Activity Ambulated with assistance in hallway  Level of Assistance Minimal assist, patient does 75% or more  Assistive Device Front wheel walker  Distance Ambulated (ft) 104 ft  Activity Response Tolerated well  Mobility Referral Yes  $Mobility charge 1 Mobility  Mobility Specialist Start Time (ACUTE ONLY) 1511  Mobility Specialist Stop Time (ACUTE ONLY) 1555  Mobility Specialist Time Calculation (min) (ACUTE ONLY) 44 min   Received pt in bed having no complaints and agreeable. Pt requiring increased time and stand by assist to get EOB and x3 attempts w/ minA to stand. Despite visual deficit in L eye, pt ambulated in hallways w/ a steady gait and no faults, mild verbal cues used when turning w/ RW. Returned back to bed w/ call bell in reach and bed alarm back on.   Frederico Hamman Mobility Specialist Please contact via SecureChat or  Rehab office at [phone]

## 2023-02-02 NOTE — Discharge Instructions (Signed)
Trevor Murphy,  You are in the hospital because of a syncopal (passing out) episode.  This appears to be related to your heart rate.  Your medications have been adjusted to hopefully prevent this from happening in the future. Please follow-up with your primary care physician and the cardiologist.

## 2023-02-02 NOTE — Progress Notes (Signed)
PROGRESS NOTE    Trevor Murphy  ZOX:[id number] DOB: 18-Aug-1934 DOA: 01/27/2023 PCP: [name], FNP   Brief Narrative: Trevor Murphy is a 88 y.o. male with a history of CAD, chronic diastolic heart failure, paroxysmal atrial fibrillation, cirrhosis of the liver with ascites, primary pretension, gout, glaucoma, osteoarthritis, chronic kidney disease stage IV, SVT. Patient presented secondary to fatigue with syncope and was found to have evidence of bradycardia concerning for symptomatic bradycardia.  On admission he was also found to have evidence of an AKI secondary to volume depletion and hyperkalemia secondary to AKI.  Patient managed with IV fluids.  Cardiology/EP consulted as patient developed symptoms of tachybradycardia syndrome medications adjusted.  No aggressive or procedural management per cardiology recommendations.   Assessment/Plan:  AKI on CKD stage IV Creatinine of 1.4-1.6.  Creatinine of 2.56 on admission.  Patient managed with IV fluids with improvement in creatinine. Creatinine down to 1.5.   Syncope Likely secondary to symptomatic bradycardia.    Transthoracic echocardiogram significant LVEF of 60 to 65% with no wall motion abnormality and normal right ventricle.  No recurrent episode in the hospital.  Physical and Occupational Therapy recommending home health physical therapy and Occupational Therapy.  Tachy-brady syndrome Cardiology/electrophysiology was consulted. Patient is on Coreg as an outpatient which was initially held with resultant rebound tachycardia.  Cardiology recommendations for nonaggressive and nonprocedural management.  Recommendation for low-dose carvedilol and allowance of slow tachycardia.  Per cardiology patient can be discharged with current regimen.  Hyperkalemia Potassium 6.4 on admission.  Presume likely secondary to acute kidney injury and home spironolactone.  Hyperkalemia treated with Lokelma.  Metabolic acidosis -Continue sodium bicarb  and  Coronary artery disease Stable.  No chest pain.  Chronic diastolic heart failure Currently euvolemic. -Continue Coreg  BPH -Continue finasteride  Alcoholic liver cirrhosis with ascites Noted. Stable. No evidence of acute decompensation.  Thrombocytopenia Likely related to underlying cirrhosis. Stable.  Underweight Estimated body mass index is 16.42 kg/m as calculated from the following:   Height as of this encounter: 5\' 10"  (1.778 m).   Weight as of this encounter: 51.9 kg. -Dietitian consult   DVT prophylaxis: SCDs Code Status:   Code Status: DNR Family Communication: None at bedside Disposition Plan: Discharge home pending TOC.  Medically stable for discharge.   Consultants:  Cardiology/electrophysiology  Procedures:  6/20: Transthoracic echocardiogram  Antimicrobials: None    Subjective: Patient no concerns this morning.  No chest pain or dyspnea.  Objective: BP 114/71   Pulse (!) 117   Temp 98.3 F (36.8 C)   Resp 18   Ht 5\' 10"  (1.778 m)   Wt 51.9 kg   SpO2 99%   BMI 16.42 kg/m   Examination:  General exam: Appears calm and comfortable Respiratory system: Clear to auscultation. Respiratory effort normal. Cardiovascular system: S1 & S2 heard, tachycardia, normal rhythm. Gastrointestinal system: Abdomen is nondistended, soft and nontender. Normal bowel sounds heard. Central nervous system: Alert. Musculoskeletal: No calf tenderness   Data Reviewed: I have personally reviewed following labs and imaging studies   Last CBC Lab Results  Component Value Date   WBC 4.1 01/31/2023   HGB 9.4 (L) 01/31/2023   HCT 29.1 (L) 01/31/2023   MCV 86.4 01/31/2023   MCH 27.9 01/31/2023   RDW 15.9 (H) 01/31/2023   PLT 104 (L) 01/31/2023     Last metabolic panel Lab Results  Component Value Date   GLUCOSE 110 (H) 01/31/2023   NA 138 01/31/2023   K  4.6 01/31/2023   CL 118 (H) 01/31/2023   CO2 16 (L) 01/31/2023   BUN 18 01/31/2023    CREATININE 1.50 (H) 01/31/2023   GFRNONAA 45 (L) 01/31/2023   CALCIUM 7.2 (L) 01/31/2023   PHOS 2.8 11/27/2022   PROT 5.3 (L) 11/27/2022   ALBUMIN 2.2 (L) 11/27/2022   LABGLOB 3.5 04/20/2022   AGRATIO 0.9 (L) 04/20/2022   BILITOT 1.1 11/27/2022   ALKPHOS 71 11/27/2022   AST 25 11/27/2022   ALT 20 11/27/2022   ANIONGAP 4 (L) 01/31/2023     Creatinine Clearance: Estimated Creatinine Clearance: 26.4 mL/min (A) (by C-G formula based on SCr of 1.5 mg/dL (H)).  No results found for this or any previous visit (from the past 240 hour(s)).    Radiology Studies: CUP PACEART REMOTE DEVICE CHECK  Result Date: 02/01/2023 ILR summary report received. Battery status OK. Normal device function. No new symptom, tachy, or pause episodes. Monthly summary reports and ROV/PRN. 1 Brady episode 01/27/23 at 11:02, 1 min, 26 bpm, similar to prior. 1 AF episode, 4 min, false due to [place] with PACs at 39 bpm. MC, CVRS.     LOS: 6 days    Jacquelin Hawking, MD [facility] 02/02/2023, 12:02 PM   If 7PM-7AM, please contact night-coverage [email or web address]

## 2023-02-02 NOTE — Discharge Summary (Signed)
Physician Discharge Summary   Patient: Trevor Murphy MRN: [id number] DOB: 03-06-1935  Admit date:     01/27/2023  Discharge date: 02/02/23  Discharge Physician: Jacquelin Hawking, MD   PCP: [name], FNP   Recommendations at discharge:  PCP & cardiology follow-up Repeat BMP in 3-5 days Dietitian consult  Discharge Diagnoses: Principal Problem:   Syncope Active Problems:   AKI (acute kidney injury) (HCC)   Chronic kidney disease, stage 3b (HCC)   Hyperkalemia   Chronic diastolic CHF (congestive heart failure) (HCC)   Coronary artery disease involving native coronary artery of native heart without angina pectoris   BPH (benign prostatic hyperplasia)   Alcoholic cirrhosis of liver with ascites (HCC)   Tachy-brady syndrome (HCC)   Symptomatic bradycardia  Resolved Problems:   * No resolved hospital problems. *  Hospital Course: JYMIR DUNAJ is a 88 y.o. male with a history of CAD, chronic diastolic heart failure, paroxysmal atrial fibrillation, cirrhosis of the liver with ascites, primary pretension, gout, glaucoma, osteoarthritis, chronic kidney disease stage IIb, SVT. Patient presented secondary to fatigue with syncope and was found to have evidence of bradycardia concerning for symptomatic bradycardia.  On admission he was also found to have evidence of an AKI secondary to volume depletion and hyperkalemia secondary to AKI.  Patient managed with IV fluids.  Cardiology/EP consulted as patient developed symptoms of tachybradycardia syndrome medications adjusted.  No aggressive or procedural management per cardiology recommendations.   Assessment and Plan:  AKI on CKD stage IIIb Patient does not appear to have CKD stage IV. Baseline creatinine of 1.4-1.6.  Creatinine of 2.56 on admission.  Patient managed with IV fluids with improvement in creatinine. Creatinine down to 1.5.  AKI resolved.   Syncope Likely secondary to symptomatic bradycardia. Transthoracic echocardiogram  significant LVEF of 60 to 65% with no wall motion abnormality and normal right ventricle.  No recurrent episode in the hospital.  Physical and Occupational Therapy recommending home health physical therapy and Occupational Therapy.   Tachy-brady syndrome Cardiology/electrophysiology was consulted. Patient is on Coreg as an outpatient which was initially held with resultant rebound tachycardia.  Cardiology recommendations for nonaggressive and nonprocedural management.  Recommendation for low-dose carvedilol and allowance of slow tachycardia.  Per cardiology patient can be discharged with current regimen.   Hyperkalemia Potassium 6.4 on admission.  Presume likely secondary to acute kidney injury and home spironolactone.  Hyperkalemia treated with Lokelma.   Metabolic acidosis Continue sodium bicarb tablets   Coronary artery disease Stable.  No chest pain.   Chronic diastolic heart failure Currently euvolemic. Continue Coreg 3/125 mg BID.   BPH Continue finasteride.   Alcoholic liver cirrhosis with ascites Noted. Stable. No evidence of acute decompensation. Resume Lasix and Spironolactone.   Thrombocytopenia Likely related to underlying cirrhosis. Stable.   Underweight Estimated body mass index is 16.42 kg/m as calculated from the following:   Height as of this encounter: 5\' 10"  (1.778 m).   Weight as of this encounter: 51.9 kg.  Consultants: Cardiology Procedures performed: Transthoracic Echocardiogram  Disposition: Home health Diet recommendation: Low sodium diet   DISCHARGE MEDICATION: Allergies as of 02/02/2023   No Known Allergies      Medication List     TAKE these medications    acetaminophen 500 MG tablet Commonly known as: TYLENOL Take 1,000 mg by mouth 2 (two) times daily as needed for mild pain or moderate pain (leg pain).   carvedilol 3.125 MG tablet Commonly known as: COREG Take 1 tablet (3.125  mg total) by mouth 2 (two) times daily with a meal. Start  taking on: February 03, 2023 What changed:  medication strength how much to take   dapagliflozin propanediol 10 MG Tabs tablet Commonly known as: [name] Take 1 tablet (10 mg total) by mouth daily before breakfast.   ferrous sulfate 325 (65 FE) MG tablet Commonly known as: FeroSul TAKE 1 TABLET(325 MG) BY MOUTH DAILY What changed:  how much to take how to take this when to take this additional instructions   finasteride 5 MG tablet Commonly known as: PROSCAR Take 5 mg by mouth daily.   furosemide 20 MG tablet Commonly known as: LASIX Take 1 tablet (20 mg total) by mouth daily.   lactulose 10 GM/15ML solution Commonly known as: CHRONULAC Take 15 mLs (10 g total) by mouth 3 (three) times daily as needed for mild constipation.   Lumigan 0.01 % Soln Generic drug: bimatoprost Place 1 drop into both eyes at bedtime.   Simbrinza 1-0.2 % Susp Generic drug: Brinzolamide-Brimonidine Follow up with your PCP on when to restart this medication. What changed:  how much to take how to take this when to take this additional instructions   sodium bicarbonate 650 MG tablet Take 1 tablet (650 mg total) by mouth 2 (two) times daily.   spironolactone 50 MG tablet Commonly known as: ALDACTONE Take 1 tablet (50 mg total) by mouth daily.   traMADol 50 MG tablet Commonly known as: ULTRAM Take 1 tablet (50 mg total) by mouth every 12 (twelve) hours as needed for moderate pain. What changed: when to take this        Follow-up Information     [facility] Follow up.   Specialty: Home Health Services Why: Someone will call you for resumption of care visit. Contact information: [address] [place] [address] [phone]         [name], FNP. Schedule an appointment as soon as possible for a visit in 1 week(s).   Specialty: General Practice Why: For hospital follow-up Contact information: [address] [place]  [address] [phone]         Duke Salvia, MD. Call in 2 week(s).   Specialty: Cardiology Why: For hospital follow-up Contact information: [address] [place] [address] [phone]                Discharge Exam: BP 137/66   Pulse (!) 50   Temp 98.3 F (36.8 C)   Resp 18   Ht 5\' 10"  (1.778 m)   Wt 51.9 kg   SpO2 99%   BMI 16.42 kg/m   General exam: Appears calm and comfortable Respiratory system: Clear to auscultation. Respiratory effort normal. Cardiovascular system: S1 & S2 heard, tachycardia, normal rhythm. Gastrointestinal system: Abdomen is nondistended, soft and nontender. Normal bowel sounds heard. Central nervous system: Alert. Musculoskeletal: No calf tenderness  Condition at discharge: fair  The results of significant diagnostics from this hospitalization (including imaging, microbiology, ancillary and laboratory) are listed below for reference.   Imaging Studies: CUP PACEART REMOTE DEVICE CHECK  Result Date: 02/01/2023 ILR summary report received. Battery status OK. Normal device function. No new symptom, tachy, or pause episodes. Monthly summary reports and ROV/PRN. 1 Brady episode 01/27/23 at 11:02, 1 min, 26 bpm, similar to prior. 1 AF episode, 4 min, false due to [place] with PACs at 39 bpm. MC, CVRS.  ECHOCARDIOGRAM COMPLETE  Result Date: 01/28/2023  ECHOCARDIOGRAM REPORT   Patient Name:   TANIS BURNLEY Date of Exam: 01/28/2023 Medical Rec #:  [id number]    Height:       70.0 in Accession #:    [id number]   Weight:       115.0 lb Date of Birth:  02-24-1935    BSA:          1.650 m Patient Age:    87 years     BP:           111/55 mmHg Patient Gender: M            HR:           56 bpm. Exam Location:  Inpatient Procedure: 2D Echo, Cardiac Doppler and Color Doppler Indications:    Syncope R55  History:        Patient has prior history of Echocardiogram examinations, most                 recent 09/24/2022. CHF, CAD, Pacemaker,  Stroke, Arrythmias:Atrial                 Fibrillation, Bradycardia and Tachycardia,                 Signs/Symptoms:Syncope; Risk Factors:Dyslipidemia. CKD, stage 3.  Sonographer:    [name] Referring Phys: [id number] JAN A MANSY IMPRESSIONS  1. Left ventricular ejection fraction, by estimation, is 60 to 65%. The left ventricle has normal function. The left ventricle has no regional wall motion abnormalities. There is mild left ventricular hypertrophy. Left ventricular diastolic parameters are indeterminate.  2. Right ventricular systolic function is normal. The right ventricular size is normal. There is mildly elevated pulmonary artery systolic pressure.  3. Left atrial size was moderately dilated.  4. The mitral valve is grossly normal. Mild mitral valve regurgitation.  5. AI jet directed toward the anterior mitral valve leaflet. The aortic valve is normal in structure. Aortic valve regurgitation is moderate.  6. The inferior vena cava is normal in size with greater than 50% respiratory variability, suggesting right atrial pressure of 3 mmHg. Comparison(s): No significant change from prior study. FINDINGS  Left Ventricle: Left ventricular ejection fraction, by estimation, is 60 to 65%. The left ventricle has normal function. The left ventricle has no regional wall motion abnormalities. The left ventricular internal cavity size was normal in size. There is  mild left ventricular hypertrophy. Left ventricular diastolic parameters are indeterminate. Right Ventricle: The right ventricular size is normal. Right ventricular systolic function is normal. There is mildly elevated pulmonary artery systolic pressure. The tricuspid regurgitant velocity is 3.03 m/s, and with an assumed right atrial pressure of 3 mmHg, the estimated right ventricular systolic pressure is 39.7 mmHg. Left Atrium: Left atrial size was moderately dilated. Right Atrium: Right atrial size was normal in size. Pericardium: There is no evidence of  pericardial effusion. Mitral Valve: The mitral valve is grossly normal. Mild mitral valve regurgitation. Tricuspid Valve: Tricuspid valve regurgitation is mild. Aortic Valve: AI jet directed toward the anterior mitral valve leaflet. The aortic valve is normal in structure. Aortic valve regurgitation is moderate. Aortic valve peak gradient measures 7.9 mmHg. Pulmonic Valve: Pulmonic valve regurgitation is trivial. Aorta: The aortic root and ascending aorta are structurally normal, with no evidence of dilitation. Venous: The inferior vena cava is normal in size with greater than 50% respiratory variability, suggesting right atrial pressure of 3 mmHg. IAS/Shunts: The interatrial septum was not well visualized.  LEFT VENTRICLE PLAX  2D LVIDd:         4.40 cm   Diastology LVIDs:         2.40 cm   LV e' lateral:   14.50 cm/s LV PW:         1.10 cm   LV E/e' lateral: 2.7 LV IVS:        1.20 cm LVOT diam:     2.20 cm LV SV:         82 LV SV Index:   50 LVOT Area:     3.80 cm  RIGHT VENTRICLE             IVC RV S prime:     17.60 cm/s  IVC diam: 1.40 cm TAPSE (M-mode): 3.1 cm LEFT ATRIUM             Index        RIGHT ATRIUM           Index LA diam:        3.60 cm 2.18 cm/m   RA Area:     13.90 cm LA Vol (A2C):   43.3 ml 26.24 ml/m  RA Volume:   27.90 ml  16.91 ml/m LA Vol (A4C):   53.8 ml 32.61 ml/m LA Biplane Vol: 51.8 ml 31.40 ml/m  AORTIC VALVE                 PULMONIC VALVE AV Area (Vmax): 2.58 cm     PR End Diast Vel: 6.35 msec AV Vmax:        140.50 cm/s AV Peak Grad:   7.9 mmHg LVOT Vmax:      95.43 cm/s LVOT Vmean:     56.700 cm/s LVOT VTI:       0.215 m  AORTA Ao Root diam: 3.70 cm Ao Asc diam:  3.60 cm MITRAL VALVE               TRICUSPID VALVE MV Area (PHT): 3.34 cm    TR Peak grad:   36.7 mmHg MV Decel Time: 227 msec    TR Vmax:        303.00 cm/s MR Peak grad: 108.6 mmHg MR Vmax:      521.00 cm/s  SHUNTS MV E velocity: 38.63 cm/s  Systemic VTI:  0.22 m MV A velocity: 66.70 cm/s  Systemic Diam: 2.20 cm  MV E/A ratio:  0.58 Photographer signed by Carolan Clines Signature Date/Time: 01/28/2023/9:41:25 AM    Final    CT ABDOMEN PELVIS WO CONTRAST  Result Date: 01/27/2023 CLINICAL DATA:  Acute abdominal pain EXAM: CT ABDOMEN AND PELVIS WITHOUT CONTRAST TECHNIQUE: Multidetector CT imaging of the abdomen and pelvis was performed following the standard protocol without IV contrast. RADIATION DOSE REDUCTION: This exam was performed according to the departmental dose-optimization program which includes automated exposure control, adjustment of the mA and/or kV according to patient size and/or use of iterative reconstruction technique. COMPARISON:  11/22/2022 FINDINGS: Lower chest: Mild atelectatic changes are noted bilaterally. Hepatobiliary: Gallbladder is well distended. Liver appears within normal limits. Pancreas: Unremarkable. No pancreatic ductal dilatation or surrounding inflammatory changes. Spleen: Normal in size without focal abnormality. Adrenals/Urinary Tract: Adrenal glands are within normal limits. Kidneys demonstrate renal cystic change which appears simple in nature. No follow-up is recommended. No renal calculi or obstructive changes are seen. The bladder is decompressed. Stomach/Bowel: No obstructive or inflammatory changes of the colon are seen. Appendix is within normal limits. Contrast is seen within the stomach  and proximal small bowel. No inflammatory changes are seen. Vascular/Lymphatic: Aortic atherosclerosis. No enlarged abdominal or pelvic lymph nodes. Reproductive: Prostate is unremarkable. Other: No abdominal wall hernia or abnormality. No abdominopelvic ascites. Musculoskeletal: Multilevel degenerative changes are noted without acute abnormality. IMPRESSION: No acute abnormality is noted to correspond with the given clinical history. Electronically Signed   By: Alcide Clever M.D.   On: 01/27/2023 20:53   DG Chest Portable 1 View  Result Date: 01/27/2023 CLINICAL DATA:  dyspnea  EXAM: PORTABLE CHEST 1 VIEW COMPARISON:  CXR 09/23/22 FINDINGS: Left-sided loop recorder device in place. There is a hazy opacity at the left lung base could represent atelectasis or infection. No radiographically apparent displaced rib fractures. Normal cardiac and mediastinal contours. Visualized upper abdomen is notable for gas distended loops of bowel. There is subdiaphragmatic lucency that could represent air trapped in a gas distended stomach, but pneumoperitoneum is not excluded. Recommend further evaluation with a dedicated abdominal radiograph. IMPRESSION: 1. Hazy opacity at the left lung base could represent atelectasis or infection. 2. Subdiaphragmatic lucency could represent air trapped in a gas distended stomach, but pneumoperitoneum is not excluded. Recommend further evaluation with a dedicated abdominal radiograph. Electronically Signed   By: Lorenza Cambridge M.D.   On: 01/27/2023 18:39    Microbiology: Results for orders placed or performed during the hospital encounter of 09/23/22  Gram stain     Status: None   Collection Time: 09/24/22  2:06 PM   Specimen: Abdomen; Peritoneal Fluid  Result Value Ref Range Status   Specimen Description ABDOMEN PERITONEAL  Final   Special Requests NONE  Final   Gram Stain   Final    WBC PRESENT, PREDOMINANTLY MONONUCLEAR NO ORGANISMS SEEN CYTOSPIN SMEAR Performed at [facility], [address], [place], [place] [address]    Report Status 09/24/2022 FINAL  Final  Culture, body fluid w Gram Stain-bottle     Status: None   Collection Time: 09/24/22  2:06 PM   Specimen: Abdomen  Result Value Ref Range Status   Specimen Description ABDOMEN PERITONEAL  Final   Special Requests NONE  Final   Culture   Final    NO GROWTH 5 DAYS Performed at [facility], [address], [place], [place] [address]    Report Status 09/29/2022 FINAL  Final    Labs: CBC: Recent Labs  Lab 01/27/23 1749 01/28/23 0449 01/29/23 0317 01/30/23 0307  01/31/23 0257  WBC 4.5 4.2 3.3* 3.6* 4.1  NEUTROABS 2.6  --   --   --   --   HGB 12.3* 9.7* 9.8* 9.8* 9.4*  HCT 39.4 31.0* 30.8* 31.1* 29.1*  MCV 87.9 87.3 85.3 87.6 86.4  PLT 217 113* 100* 113* 104*   Basic Metabolic Panel: Recent Labs  Lab 01/27/23 1749 01/28/23 0449 01/29/23 0317 01/30/23 0307 01/31/23 0257  NA 128* 133* 137 141 138  K 6.4* 5.7* 5.1 5.0 4.6  CL 103 110 111 117* 118*  CO2 18* 17* 18* 16* 16*  GLUCOSE 188* 103* 121* 116* 110*  BUN 46* 43* 32* 24* 18  CREATININE 2.56* 2.20* 1.92* 1.58* 1.50*  CALCIUM 7.7* 7.5* 7.8* 7.4* 7.2*  MG 2.3  --  2.1 1.9 1.8    CBG: Recent Labs  Lab 01/27/23 1818 01/29/23 1614  GLUCAP 130* 100*    Discharge time spent: 35 minutes.  Signed: Jacquelin Hawking, MD [facility] 02/02/2023

## 2023-02-03 ENCOUNTER — Telehealth: Payer: Self-pay

## 2023-02-03 NOTE — Transitions of Care (Post Inpatient/ED Visit) (Signed)
02/03/2023  Name: Trevor Murphy MRN: [id number] DOB: 1935/02/22  Today's TOC FU Call Status: Today's TOC FU Call Status:: Successful TOC FU Call Competed TOC FU Call Complete Date: 02/03/23  Transition Care Management Follow-up Telephone Call Date of Discharge: 02/02/23 Discharge Facility: [facility]) Type of Discharge: Inpatient Admission Primary Inpatient Discharge Diagnosis:: "AKI,hyperkalemia,bradycardia" How have you been since you were released from the hospital?: Better (Spoke w/ both sister-Trevor Murphy & pt. Pt doing well so far. No new issues/concerns-rested well last night. He just ate lunch-no GI issues. LBM yest.) Any questions or concerns?: No  Items Reviewed: Did you receive and understand the discharge instructions provided?: Yes Medications obtained,verified, and reconciled?: Yes (Medications Reviewed) Any new allergies since your discharge?: No Dietary orders reviewed?: Yes Type of Diet Ordered:: low salt/heart healthy Do you have support at home?: Yes People in Home: alone, sibling(s) Name of Support/Comfort Primary Source: sister-Trevor Murphy checks on him daily  Medications Reviewed Today: Medications Reviewed Today     Reviewed by Trevor Minerva, RN (Registered Nurse) on 02/03/23 at 1335  Med List Status: <None>   Medication Order Taking? Sig Documenting Provider Last Dose Status Informant  acetaminophen (TYLENOL) 500 MG tablet [id number] Yes Take 1,000 mg by mouth 2 (two) times daily as needed for mild pain or moderate pain (leg pain). [provider] Taking Active Family Member  carvedilol (COREG) 3.125 MG tablet [id number] Yes Take 1 tablet (3.125 mg total) by mouth 2 (two) times daily with a meal. Narda Bonds, MD Taking Active   dapagliflozin propanediol ([name]) 10 MG TABS tablet [id number] Yes Take 1 tablet (10 mg total) by mouth daily before breakfast. [name], FNP Taking Active Family Member  ferrous sulfate (FEROSUL) 325 (65 FE) MG  tablet [id number] Yes TAKE 1 TABLET(325 MG) BY MOUTH DAILY  Patient taking differently: Take 325 mg by mouth daily.   [name], FNP Taking Active Family Member  finasteride (PROSCAR) 5 MG tablet [id number] Yes Take 5 mg by mouth daily. [provider] Taking Active Family Member  furosemide (LASIX) 20 MG tablet [id number] Yes Take 1 tablet (20 mg total) by mouth daily. Almon Hercules, MD Taking Active Family Member  lactulose (CHRONULAC) 10 GM/15ML solution [id number] No Take 15 mLs (10 g total) by mouth 3 (three) times daily as needed for mild constipation.  Patient not taking: Reported on 01/27/2023   Almon Hercules, MD Not Taking Active Family Member  LUMIGAN 0.01 % SOLN [id number] Yes Place 1 drop into both eyes at bedtime. [provider] Taking Active Family Member           Med Note (WHITE, Trevor Murphy Jan 27, 2023  9:42 PM) Extra drop placed today, due to blurry vision  SIMBRINZA 1-0.2 % SUSP [id number] Yes Follow up with your PCP on when to restart this medication.  Patient taking differently: Place 1 drop into both eyes 3 (three) times daily.   Narda Bonds, MD Taking Active Family Member  sodium bicarbonate 650 MG tablet [id number] Yes Take 1 tablet (650 mg total) by mouth 2 (two) times daily. Narda Bonds, MD Taking Active   spironolactone (ALDACTONE) 50 MG tablet [id number] Yes Take 1 tablet (50 mg total) by mouth daily. Lewie Chamber, MD Taking Active Family Member  traMADol Janean Sark) 50 MG tablet [id number] Yes Take 1 tablet (50 mg total) by mouth every 12 (twelve) hours as needed for moderate pain. Narda Bonds, MD Taking Active  Med List Note Trevor Murphy, CPhT 01/27/23 2146): Sister, Trevor Murphy, assists patient with medication            Home Care and Equipment/Supplies: Were Home Health Services Ordered?: Yes Name of Home Health Agency:: [facility] Has Agency set up a time to come to your home?: No (sister has contact info-aware to call if she does  not hear from them within 48hrs post discharge) Any new equipment or medical supplies ordered?: No  Functional Questionnaire: Do you need assistance with bathing/showering or dressing?: No Do you need assistance with meal preparation?: Yes Do you need assistance with eating?: No Do you have difficulty maintaining continence: No Do you need assistance with getting out of bed/getting out of a chair/moving?: No Do you have difficulty managing or taking your medications?: Yes (sister helps with med mgmt)  Follow up appointments reviewed: PCP Follow-up appointment confirmed?: Yes Date of PCP follow-up appointment?: 02/08/23 Follow-up Provider: [facility] Follow-up appointment confirmed?: Yes Date of Specialist follow-up appointment?: 02/09/23 Follow-Up Specialty Provider:: Trevor Conners Do you need transportation to your follow-up appointment?: No (sister confirms they are able to get pt to appts) Do you understand care options if your condition(s) worsen?: Yes-patient verbalized understanding  SDOH Interventions Today    Flowsheet Row Most Recent Value  SDOH Interventions   Food Insecurity Interventions Intervention Not Indicated  Transportation Interventions Intervention Not Indicated       TOC Interventions Today    Flowsheet Row Most Recent Value  TOC Interventions   TOC Interventions Discussed/Reviewed TOC Interventions Discussed, Arranged PCP follow up within 7 days/Care Guide scheduled      Interventions Today    Flowsheet Row Most Recent Value  General Interventions   General Interventions Discussed/Reviewed General Interventions Discussed, Doctor Visits  Doctor Visits Discussed/Reviewed Doctor Visits Discussed, Specialist, PCP  PCP/Specialist Visits Compliance with follow-up visit  Education Interventions   Education Provided Provided Education  Provided Verbal Education On Nutrition, When to see the doctor, Medication  Nutrition  Interventions   Nutrition Discussed/Reviewed Nutrition Discussed, Adding fruits and vegetables, Decreasing salt, Fluid intake, Decreasing fats  Pharmacy Interventions   Pharmacy Dicussed/Reviewed Pharmacy Topics Discussed, Medications and their functions  Safety Interventions   Safety Discussed/Reviewed Safety Discussed, Home Safety  Home Safety Assistive Devices        [place], [place] [facility] Care Management Care Management Community Coordinator Direct Phone: [phone] Toll Free: [phone] Fax: [phone]

## 2023-02-04 ENCOUNTER — Ambulatory Visit: Payer: Self-pay

## 2023-02-04 ENCOUNTER — Encounter: Payer: Self-pay | Admitting: Family Medicine

## 2023-02-04 ENCOUNTER — Ambulatory Visit (INDEPENDENT_AMBULATORY_CARE_PROVIDER_SITE_OTHER): Payer: 59 | Admitting: Family Medicine

## 2023-02-04 VITALS — BP 110/60 | HR 51 | Temp 98.2°F

## 2023-02-04 DIAGNOSIS — I110 Hypertensive heart disease with heart failure: Secondary | ICD-10-CM

## 2023-02-04 DIAGNOSIS — R062 Wheezing: Secondary | ICD-10-CM | POA: Diagnosis not present

## 2023-02-04 DIAGNOSIS — E875 Hyperkalemia: Secondary | ICD-10-CM

## 2023-02-04 DIAGNOSIS — I739 Peripheral vascular disease, unspecified: Secondary | ICD-10-CM | POA: Diagnosis not present

## 2023-02-04 DIAGNOSIS — Z95810 Presence of automatic (implantable) cardiac defibrillator: Secondary | ICD-10-CM

## 2023-02-04 DIAGNOSIS — Z95818 Presence of other cardiac implants and grafts: Secondary | ICD-10-CM

## 2023-02-04 DIAGNOSIS — R627 Adult failure to thrive: Secondary | ICD-10-CM

## 2023-02-04 DIAGNOSIS — I5032 Chronic diastolic (congestive) heart failure: Secondary | ICD-10-CM | POA: Diagnosis not present

## 2023-02-04 DIAGNOSIS — N179 Acute kidney failure, unspecified: Secondary | ICD-10-CM

## 2023-02-04 MED ORDER — ALBUTEROL SULFATE HFA 108 (90 BASE) MCG/ACT IN AERS
1.0000 | INHALATION_SPRAY | Freq: Four times a day (QID) | RESPIRATORY_TRACT | 0 refills | Status: DC | PRN
Start: 2023-02-04 — End: 2023-02-25

## 2023-02-04 NOTE — Progress Notes (Signed)
I,Victoria T Hamilton, CMA,acting as a Neurosurgeon for [employer], NP.,have documented all relevant documentation on the behalf of Cherese Lozano, NP,as directed by  Mayank Teuscher Moshe Salisbury, NP while in the presence of Skyann Ganim, NP.  Subjective:  Patient ID: Trevor Murphy , male    DOB: 07-Mar-1935 , 88 y.o.   MRN: [id number]  Chief Complaint  Patient presents with   hospital f/u    HPI  Patient presents today for hospital follow up. He is accompanied by his sister. He was admitted at [facility]  on 01/27/2023 for Syncope and discharged on 02/02/2023. Patient's sister reported that the nephew reported that patient was wheezing yesterday, he has not been eating and he feels weak.      Past Medical History:  Diagnosis Date   Arthritis    CAD (coronary artery disease)    a. Cath 08/2009: minimal nonobstructive disease (25% LAD). b. Lexiscan nuc 6/215: EF 54%, no ischemia or infarction.   Chronic diastolic CHF (congestive heart failure) (HCC)    a. Echo 2013: EF 55-60%, mild AI, mild MR. b. Echo 5/15 with EF 60-65%, no WMA, mild LVH, mild AI.   CKD (chronic kidney disease) stage 3, GFR 30-59 ml/min (HCC) 07/26/2012   a. Felt likely due to HTN.   CVA (cerebral infarction) 07/28/2012   Glaucoma    Gout    H/O umbilical hernia repair    Headache(784.0)    " WHEN MY BLOOD PRESSURE IS UP"   History of acute renal failure    History of alcohol abuse    HTN (hypertension)    Hypertension    a. Patent renal arteries 2005.   Noncompliance    OA (osteoarthritis)    Other and unspecified hyperlipidemia    Poor circulation    Sinus bradycardia    SVT (supraventricular tachycardia)    a. Possible AVNRT.  Got adenosine from EMS in 2015 for SVT.  During 5/15 hospitalization had episode of short R-P tachycardia (possible AVNRT) terminated by adenosine.  b. 04/2016 - recurrent tachycardia (SVT vs. AVNRT), again terminated by Adenosine   Visual impairment      Family History  Problem Relation Age of Onset    CVA Mother    Hypertension Brother    Hypertension Sister    Hypertension Brother    Stroke Sister    Heart attack Neg Hx      Current Outpatient Medications:    acetaminophen (TYLENOL) 500 MG tablet, Take 1,000 mg by mouth 2 (two) times daily as needed for mild pain or moderate pain (leg pain)., Disp: , Rfl:    albuterol (VENTOLIN HFA) 108 (90 Base) MCG/ACT inhaler, Inhale 1-2 puffs into the lungs every 6 (six) hours as needed for wheezing or shortness of breath., Disp: 6.7 g, Rfl: 0   carvedilol (COREG) 3.125 MG tablet, Take 1 tablet (3.125 mg total) by mouth 2 (two) times daily with a meal., Disp: 60 tablet, Rfl: 2   dapagliflozin propanediol ([name]) 10 MG TABS tablet, Take 1 tablet (10 mg total) by mouth daily before breakfast., Disp: 30 tablet, Rfl: 2   ferrous sulfate (FEROSUL) 325 (65 FE) MG tablet, TAKE 1 TABLET(325 MG) BY MOUTH DAILY (Patient taking differently: Take 325 mg by mouth daily.), Disp: 90 tablet, Rfl: 1   finasteride (PROSCAR) 5 MG tablet, Take 5 mg by mouth daily., Disp: , Rfl:    furosemide (LASIX) 20 MG tablet, Take 1 tablet (20 mg total) by mouth daily., Disp: 90 tablet, Rfl:  1   LUMIGAN 0.01 % SOLN, Place 1 drop into both eyes at bedtime., Disp: , Rfl:    SIMBRINZA 1-0.2 % SUSP, Follow up with your PCP on when to restart this medication. (Patient taking differently: Place 1 drop into both eyes 3 (three) times daily.), Disp: , Rfl:    sodium bicarbonate 650 MG tablet, Take 1 tablet (650 mg total) by mouth 2 (two) times daily., Disp: 60 tablet, Rfl: 0   spironolactone (ALDACTONE) 50 MG tablet, Take 1 tablet (50 mg total) by mouth daily., Disp: 30 tablet, Rfl: 3   traMADol (ULTRAM) 50 MG tablet, Take 1 tablet (50 mg total) by mouth every 12 (twelve) hours as needed for moderate pain., Disp: , Rfl:    No Known Allergies   Review of Systems  Constitutional:  Positive for activity change and appetite change.  HENT: Negative.    Respiratory: Negative.     Cardiovascular:  Positive for leg swelling.  Musculoskeletal:  Positive for gait problem.  Skin: Negative.   Allergic/Immunologic: Negative.   Neurological:  Positive for weakness.  Hematological: Negative.   Psychiatric/Behavioral:  The patient is not nervous/anxious.      Today's Vitals   02/04/23 1513  BP: 110/60  Pulse: (!) 51  Temp: 98.2 F (36.8 C)  SpO2: 93%   There is no height or weight on file to calculate BMI.  Wt Readings from Last 3 Encounters:  01/29/23 114 lb 6.7 oz (51.9 kg)  12/08/22 137 lb (62.1 kg)  11/27/22 151 lb 10.8 oz (68.8 kg)     Objective:  Physical Exam HENT:     Head: Normocephalic.  Pulmonary:     Effort: Pulmonary effort is normal.     Breath sounds: Normal breath sounds. No wheezing.  Abdominal:     Palpations: Abdomen is soft.  Musculoskeletal:     Comments: On a wheelchair  Neurological:     Mental Status: He is alert. Mental status is at baseline.     Motor: Weakness present.     Comments: Not oriented to time  Psychiatric:        Mood and Affect: Mood normal.        Behavior: Behavior normal.         Assessment And Plan:  Chronic diastolic CHF (congestive heart failure) (HCC) [I50.32] Assessment & Plan: 01/28/2023: @ D Echo: Ef 60-65%   Hyperkalemia Assessment & Plan: Recheck lab per hospital discharge summary  Orders: -     BMP8+eGFR  PVD (peripheral vascular disease) (HCC) [I73.9]  Wheezing without diagnosis of asthma -     Albuterol Sulfate HFA; Inhale 1-2 puffs into the lungs every 6 (six) hours as needed for wheezing or shortness of breath.  Dispense: 6.7 g; Refill: 0  Failure to thrive in adult Assessment & Plan: Advise sister to allow patient eat any food of his choice to motivate him   Implantable loop recorder present     Return if symptoms worsen or fail to improve, for keep appt for physical.  Patient was given opportunity to ask questions. Patient verbalized understanding of the plan and was  able to repeat key elements of the plan. All questions were answered to their satisfaction.  Lettie Czarnecki Moshe Salisbury, NP  I, Rayn Shorb Moshe Salisbury, NP, have reviewed all documentation for this visit. The documentation on 02/08/23 for the exam, diagnosis, procedures, and orders are all accurate and complete.   IF YOU HAVE BEEN REFERRED TO A SPECIALIST, IT MAY TAKE 1-2 WEEKS TO SCHEDULE/PROCESS  THE REFERRAL. IF YOU HAVE NOT HEARD FROM US/SPECIALIST IN TWO WEEKS, PLEASE GIVE [place] A CALL AT [phone].   THE PATIENT IS ENCOURAGED TO PRACTICE SOCIAL DISTANCING DUE TO THE COVID-19 PANDEMIC.

## 2023-02-05 LAB — BMP8+EGFR
BUN/Creatinine Ratio: 10 (ref 10–24)
BUN: 17 mg/dL (ref 8–27)
CO2: 15 mmol/L — ABNORMAL LOW (ref 20–29)
Calcium: 8 mg/dL — ABNORMAL LOW (ref 8.6–10.2)
Chloride: 114 mmol/L — ABNORMAL HIGH (ref 96–106)
Creatinine, Ser: 1.64 mg/dL — ABNORMAL HIGH (ref 0.76–1.27)
Glucose: 74 mg/dL (ref 70–99)
Potassium: 5.1 mmol/L (ref 3.5–5.2)
Sodium: 139 mmol/L (ref 134–144)
eGFR: 41 mL/min/{1.73_m2} — ABNORMAL LOW (ref >59)

## 2023-02-05 NOTE — Patient Instructions (Signed)
Visit Information  Thank you for taking time to visit with me today. Please don't hesitate to contact me if I can be of assistance to you.   Following are the goals we discussed today:   Goals Addressed             This Visit's Progress    To reduce number of falls       Care Coordination Interventions: Placed successful outbound call to sister Roderic Scarce  Reviewed medications and discussed potential side effects of medications such as dizziness and frequent urination Assessed for signs and symptoms of orthostatic hypotension Assessed for falls since last encounter Determined patient experienced a syncopal episode resulting in a hospital admission to [facility]; dx: syncope; 01-27-23/02-02-23 Review of patient status, including review of consultant's reports, relevant laboratory and other test results, and medications completed Determined sister Roderic Scarce is concerned about patient's breathing as she reports he is "wheezing" and this is a new symptom, she is unable to measure patient's oxygen level at home, she denies patient having other symptoms other than loss of appetite Collaborated with PCP to request patient complete his hospital follow up in office today if possible Determined patient can be seen by NP provider today at 3 pm but should go to the ED if symptoms worsen, sister Roderic Scarce was advised and verbalizes understanding, she plans to drive and accompany patient to his PCP follow up        Our next appointment is by telephone on 02/16/23 at 11:00 AM  Please call the care guide team at [phone] if you need to cancel or reschedule your appointment.   If you are experiencing a Mental Health or Behavioral Health Crisis or need someone to talk to, please call 1-800-273-TALK (toll free, 24 hour hotline)  The patient verbalized understanding of instructions, educational materials, and care plan provided today and DECLINED offer to receive copy of patient instructions,  educational materials, and care plan.   Delsa Sale, RN, BSN, CCM Care Management Coordinator [facility] Care Management Direct Phone: [phone]

## 2023-02-05 NOTE — Patient Outreach (Signed)
  Care Coordination   Follow Up Visit Note   02/05/2023 Name: Trevor Murphy MRN: [id number] DOB: 1935/03/27  Trevor Murphy is a 88 y.o. year old male who sees [name], FNP for primary care. I spoke with sister Lorna Few by phone today.  What matters to the patients health and wellness today?  Sister Roderic Scarce is concerned about patient's wheezing.     Goals Addressed             This Visit's Progress    To reduce number of falls       Care Coordination Interventions: Placed successful outbound call to sister Roderic Scarce  Reviewed medications and discussed potential side effects of medications such as dizziness and frequent urination Assessed for signs and symptoms of orthostatic hypotension Assessed for falls since last encounter Determined patient experienced a syncopal episode resulting in a hospital admission to [facility]; dx: syncope; 01-27-23/02-02-23 Review of patient status, including review of consultant's reports, relevant laboratory and other test results, and medications completed Determined sister Roderic Scarce is concerned about patient's breathing as she reports he is "wheezing" and this is a new symptom, she is unable to measure patient's oxygen level at home, she denies patient having other symptoms other than loss of appetite Collaborated with PCP to request patient complete his hospital follow up in office today if possible Determined patient can be seen by NP provider today at 3 pm but should go to the ED if symptoms worsen, sister Roderic Scarce was advised and verbalizes understanding, she plans to drive and accompany patient to his PCP follow up    Interventions Today    Flowsheet Row Most Recent Value  Chronic Disease   Chronic disease during today's visit Other  [respiratory distress/wheezing]  General Interventions   General Interventions Discussed/Reviewed General Interventions Discussed, General Interventions Reviewed, Doctor Visits, Labs, Communication with   Doctor Visits Discussed/Reviewed Doctor Visits Discussed, Doctor Visits Reviewed, PCP  Communication with PCP/Specialists  [name]]  Education Interventions   Education Provided Provided Education  Provided Verbal Education On Labs, When to see the doctor          SDOH assessments and interventions completed:  No     Care Coordination Interventions:  Yes, provided   Follow up plan: Follow up call scheduled for 02/16/23 @11 :00 AM    Encounter Outcome:  Pt. Visit Completed

## 2023-02-08 ENCOUNTER — Encounter: Payer: Self-pay | Admitting: Family Medicine

## 2023-02-08 ENCOUNTER — Inpatient Hospital Stay: Payer: Self-pay | Admitting: Nurse Practitioner

## 2023-02-08 NOTE — Progress Notes (Signed)
Cardiology Office Note:  .   Date:  02/10/2023  ID:  Trevor Murphy, DOB 03/24/1935, MRN [id number] PCP: [name], FNP  [facility] Providers Cardiologist:  Orbie Pyo, MD    Patient Profile: .      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 Lynnette Caffey at which time he was on [facility] for PAF. Was encouraged to discuss CODE status with his family. Management limited by frailty and CKD.   Admission 6/19-6/24/24 with tachy-brady syndrome.  Per Dr. Graciela Husbands, we will continue nonaggressive and nonprocedural management.  Recommendation to continue low-dose carvedilol and allow slow tachycardia. Records reveal Eliquis was d/c 09/2022 secondary to worsening liver decompensation and thrombocytopenia.  Hyperkalemia treated with Lokelma.       History of Present Illness: .   Trevor Murphy is a pleasant 88 y.o. male here with his sister. He is traveling in a wheelchair. He answers questions that are asked, but his sister indicates that some of his answers are not correct. Her son stays with the patient at night. Pt ambulates to and from the bathroom and other short distances in his home, but is otherwise dependent on care takers. Sister reports his appetite is improving. He went to PCP last week 2/2 wheezing, was given an inhaler. They feel that his wheezing has improved. He  denies cardiac concerns. He denies chest pain, shortness of breath, lower extremity edema, palpitations, melena, hemoptysis, diaphoresis, weakness, presyncope, syncope, orthopnea, and PND.   ROS: see HPI       Studies Reviewed: .         Risk Assessment/Calculations:    CHA2DS2-VASc Score = 6   This indicates a 9.7% annual risk of stroke. The patient's score is based upon: CHF History: 1 HTN History: 1 Diabetes History: 0 Stroke History: 2 Vascular Disease History: 0 Age Score: 2 Gender Score: 0    HYPERTENSION CONTROL Vitals:   02/09/23 1122 02/09/23 1148  BP: (!) 130/90 (!) 140/80    The patient's blood pressure is elevated above target today.  In order to address the patient's elevated BP: Blood pressure will be monitored at home to determine if medication changes need to be made.          Physical Exam:   VS:  BP (!) 140/80   Pulse 68   Ht 5\' 10"  (1.778 m)   Wt 120 lb 9.6 oz (54.7 kg)   BMI 17.30 kg/m    Wt Readings from Last 3 Encounters:  02/09/23 120 lb 9.6 oz (54.7 kg)  01/29/23 114 lb 6.7 oz (51.9 kg)  12/08/22 137 lb (62.1 kg)    GEN: Cachectic chronically ill appearing in no acute distress NECK: No JVD; No carotid bruits CARDIAC: RRR, no murmurs, rubs, gallops RESPIRATORY:  Clear to auscultation without rales, wheezing or rhonchi  ABDOMEN: Soft, non-tender, non-distended EXTREMITIES:  No edema; No deformity     ASSESSMENT AND PLAN: .  Tachy-bradycardia syndrome: Patient with history of tachybradycardia syndrome and recurrent SVT felt to be AV nodal reentry who presented with syncope found to be profoundly bradycardic in the setting of hyperkalemia on admission 01/28/2023.  Seen by Dr. Graciela Husbands, EP cardiologist, during admission.  Loop recorder transmission reveals infrequent pauses and infrequent tachycardia.  Recommendation for nonaggressive and nonprocedural management.  HR is stable today.  No further episodes of syncope.  He denies palpitations or  chest discomfort.  Continue carvedilol.  Hypertension: BP mildly elevated in clinic. Per sister, no reports of elevated BP from home caregiver. We will tolerate slightly elevated BP in the setting of recent syncope and frailty.   Malnutrition: Slight weight gain from hospital discharge that sister believes is secondary to improved appetite.  No evidence of volume overload.   PAF: Most recent loop recorder remote device check 02/01/2023 reveals 1 episode of AF lasting 4 minutes. He is not on anticoagulation for CHA2DS2-VASc score of 6 due to high risk of bleeding secondary to thrombocytopenia and ascites. HR is well controlled. Continue carvedilol for rate control.   Chronic HFpEF: LVEF 60 to 65%, mild LVH, indeterminate diastolic parameters on echo 01/28/2023.  He does not appear volume overloaded on exam today.  He denies shortness of breath, dyspnea, PND, orthopnea, edema.  Continue GDMT including carvedilol, spironolactone, [name], Lasix.      Dispo: 6 months with Dr. Lynnette Caffey  Signed, Eligha Bridegroom, NP-C

## 2023-02-08 NOTE — Assessment & Plan Note (Addendum)
Advise sister to allow patient eat any food of his choice to motivate him

## 2023-02-08 NOTE — Assessment & Plan Note (Signed)
Recheck lab per hospital discharge summary

## 2023-02-08 NOTE — Assessment & Plan Note (Signed)
01/28/2023: @ D Echo: Ef 60-65%

## 2023-02-09 ENCOUNTER — Ambulatory Visit: Payer: 59 | Attending: Nurse Practitioner | Admitting: Nurse Practitioner

## 2023-02-09 ENCOUNTER — Encounter: Payer: Self-pay | Admitting: Nurse Practitioner

## 2023-02-09 VITALS — BP 140/80 | HR 68 | Ht 70.0 in | Wt 120.6 lb

## 2023-02-09 DIAGNOSIS — I480 Paroxysmal atrial fibrillation: Secondary | ICD-10-CM

## 2023-02-09 DIAGNOSIS — I495 Sick sinus syndrome: Secondary | ICD-10-CM | POA: Diagnosis not present

## 2023-02-09 DIAGNOSIS — I10 Essential (primary) hypertension: Secondary | ICD-10-CM | POA: Diagnosis not present

## 2023-02-09 DIAGNOSIS — R627 Adult failure to thrive: Secondary | ICD-10-CM

## 2023-02-09 DIAGNOSIS — I5032 Chronic diastolic (congestive) heart failure: Secondary | ICD-10-CM | POA: Diagnosis not present

## 2023-02-09 NOTE — Patient Instructions (Signed)
Medication Instructions:  Your physician recommends that you continue on your current medications as directed. Please refer to the Current Medication list given to you today. *If you need a refill on your cardiac medications before your next appointment, please call your pharmacy*   Lab Work: None ordered   Testing/Procedures: None ordered   Follow-Up: At [facility], you and your health needs are our priority.  As part of our continuing mission to provide you with exceptional heart care, we have created designated Provider Care Teams.  These Care Teams include your primary Cardiologist (physician) and Advanced Practice Providers (APPs -  Physician Assistants and Nurse Practitioners) who all work together to provide you with the care you need, when you need it.  We recommend signing up for the patient portal called "MyChart".  Sign up information is provided on this After Visit Summary.  MyChart is used to connect with patients for Virtual Visits (Telemedicine).  Patients are able to view lab/test results, encounter notes, upcoming appointments, etc.  Non-urgent messages can be sent to your provider as well.   To learn more about what you can do with MyChart, go to [email or web address].    Your next appointment:   6 month(s)  Provider:   Orbie Pyo, MD     Other Instructions

## 2023-02-16 ENCOUNTER — Ambulatory Visit: Payer: Self-pay

## 2023-02-16 NOTE — Patient Outreach (Signed)
  Care Coordination   02/16/2023 Name: TAMAJ DAMBROSI MRN: [id number] DOB: 08-27-1934   Care Coordination Outreach Attempts:  An unsuccessful telephone outreach was attempted for a scheduled appointment today.  Follow Up Plan:  Additional outreach attempts will be made to offer the patient care coordination information and services.   Encounter Outcome:  No Answer   Care Coordination Interventions:  No, not indicated    Delsa Sale, RN, BSN, CCM Care Management Coordinator [facility] Care Management  Direct Phone: [phone]'

## 2023-02-22 NOTE — Progress Notes (Signed)
 Carelink Summary Report / Loop Recorder 

## 2023-02-25 ENCOUNTER — Other Ambulatory Visit: Payer: Self-pay | Admitting: Family Medicine

## 2023-02-25 DIAGNOSIS — R062 Wheezing: Secondary | ICD-10-CM

## 2023-03-08 ENCOUNTER — Ambulatory Visit (INDEPENDENT_AMBULATORY_CARE_PROVIDER_SITE_OTHER): Payer: 59

## 2023-03-08 DIAGNOSIS — I639 Cerebral infarction, unspecified: Secondary | ICD-10-CM

## 2023-03-25 NOTE — Progress Notes (Signed)
Carelink Summary Report / Loop Recorder 

## 2023-03-26 ENCOUNTER — Other Ambulatory Visit: Payer: Self-pay | Admitting: Family Medicine

## 2023-03-26 DIAGNOSIS — R062 Wheezing: Secondary | ICD-10-CM

## 2023-04-05 ENCOUNTER — Encounter: Payer: Self-pay | Admitting: Podiatry

## 2023-04-05 ENCOUNTER — Ambulatory Visit: Payer: 59 | Admitting: Podiatry

## 2023-04-05 DIAGNOSIS — M79675 Pain in left toe(s): Secondary | ICD-10-CM | POA: Diagnosis not present

## 2023-04-05 DIAGNOSIS — I739 Peripheral vascular disease, unspecified: Secondary | ICD-10-CM | POA: Diagnosis not present

## 2023-04-05 DIAGNOSIS — B351 Tinea unguium: Secondary | ICD-10-CM

## 2023-04-05 DIAGNOSIS — M79674 Pain in right toe(s): Secondary | ICD-10-CM

## 2023-04-05 NOTE — Progress Notes (Signed)
This patient returns to my office for at risk foot care.  This patient requires this care by a professional since this patient will be at risk due to having chronic kidney disease and coagulation defect.  Patient is taking eliquis.   This patient is unable to cut nails himself since the patient cannot reach his nails.These nails are painful walking and wearing shoes.  He presents to the office with his sister.  This patient presents for at risk foot care today.  General Appearance  Alert, conversant and in no acute stress.  Vascular  Dorsalis pedis and posterior tibial  pulses are weakly  palpable  bilaterally.  Capillary return is within normal limits  Bilaterally. Cold feet bilaterally. Absent digital hair  B/L.  Neurologic  Senn-Weinstein monofilament wire test within normal limits  bilaterally. Muscle power within normal limits bilaterally.  Nails Thick disfigured discolored nails with subungual debris  from hallux to fifth toes bilaterally. No evidence of bacterial infection or drainage bilaterally.  Orthopedic  No limitations of motion  feet .  No crepitus or effusions noted.  No bony pathology or digital deformities noted.  HAV  B/L.  Skin  normotropic skin with no porokeratosis noted bilaterally.  No signs of infections or ulcers noted.     Onychomycosis  Pain in right toes  Pain in left toes  Consent was obtained for treatment procedures.   Mechanical debridement of nails 1-5  bilaterally performed with a nail nipper.  Filed with dremel without incident.  Cauterized fourth toe left foot.   Return office visit     10  weeks                Told patient to return for periodic foot care and evaluation due to potential at risk complications.   Gregory Mayer DPM  

## 2023-04-08 LAB — CUP PACEART REMOTE DEVICE CHECK
Date Time Interrogation Session: 20240828230046
Implantable Pulse Generator Implant Date: 20201014

## 2023-04-13 ENCOUNTER — Ambulatory Visit (INDEPENDENT_AMBULATORY_CARE_PROVIDER_SITE_OTHER): Payer: 59

## 2023-04-13 DIAGNOSIS — I639 Cerebral infarction, unspecified: Secondary | ICD-10-CM | POA: Diagnosis not present

## 2023-04-19 ENCOUNTER — Other Ambulatory Visit: Payer: Self-pay | Admitting: Nurse Practitioner

## 2023-04-19 DIAGNOSIS — D509 Iron deficiency anemia, unspecified: Secondary | ICD-10-CM

## 2023-04-26 NOTE — Progress Notes (Signed)
Carelink Summary Report / Loop Recorder 

## 2023-04-26 NOTE — Progress Notes (Signed)
Trevor Murphy, CMA,acting as a Neurosurgeon for Trevor Felts, FNP.,have documented all relevant documentation on the behalf of Trevor Felts, FNP,as directed by  Trevor Felts, FNP while in the presence of Trevor Felts, FNP.  Subjective:   Patient ID: Trevor Murphy , male    DOB: November 30, 1934 , 88 y.o.   MRN: [id number]  Chief Complaint  Patient presents with   Annual Exam    HPI Patient presents today for HM, patient reports compliance with medications. Patient denies any chest pain, SOB, Headaches. Patient has no concerns. BP Readings from Last 3 Encounters:  04/27/23 120/80  02/09/23 (!) 140/80  02/04/23 110/60    Wt Readings from Last 3 Encounters:  04/27/23 133 lb 6.4 oz (60.5 kg)  02/09/23 120 lb 9.6 oz (54.7 kg)  01/29/23 114 lb 6.7 oz (51.9 kg)       Past Medical History:  Diagnosis Date   Arthritis    CAD (coronary artery disease)    a. Cath 08/2009: minimal nonobstructive disease (25% LAD). b. Lexiscan nuc 6/215: EF 54%, no ischemia or infarction.   Chronic diastolic CHF (congestive heart failure) (HCC)    a. Echo 2013: EF 55-60%, mild AI, mild MR. b. Echo 5/15 with EF 60-65%, no WMA, mild LVH, mild AI.   CKD (chronic kidney disease) stage 3, GFR 30-59 ml/min (HCC) 07/26/2012   a. Felt likely due to HTN.   CVA (cerebral infarction) 07/28/2012   Glaucoma    Gout    H/O umbilical hernia repair    Headache(784.0)    " WHEN MY BLOOD PRESSURE IS UP"   History of acute renal failure    History of alcohol abuse    HTN (hypertension)    Hypertension    a. Patent renal arteries 2005.   Noncompliance    OA (osteoarthritis)    Other and unspecified hyperlipidemia    Poor circulation    Sinus bradycardia    SVT (supraventricular tachycardia)    a. Possible AVNRT.  Got adenosine from EMS in 2015 for SVT.  During 5/15 hospitalization had episode of short R-P tachycardia (possible AVNRT) terminated by adenosine.  b. 04/2016 - recurrent tachycardia (SVT vs. AVNRT), again terminated  by Adenosine   Visual impairment      Family History  Problem Relation Age of Onset   CVA Mother    Hypertension Brother    Hypertension Sister    Hypertension Brother    Stroke Sister    Heart attack Neg Hx      Current Outpatient Medications:    acetaminophen (TYLENOL) 500 MG tablet, Take 1,000 mg by mouth 2 (two) times daily as needed for mild pain or moderate pain (leg pain)., Disp: , Rfl:    albuterol (VENTOLIN HFA) 108 (90 Base) MCG/ACT inhaler, INHALE 1 TO 2 PUFFS INTO THE LUNGS EVERY 6 HOURS AS NEEDED FOR WHEEZING OR SHORTNESS OF BREATH, Disp: 6.7 g, Rfl: 0   carvedilol (COREG) 3.125 MG tablet, Take 1 tablet (3.125 mg total) by mouth 2 (two) times daily with a meal., Disp: 60 tablet, Rfl: 2   FEROSUL 325 (65 Fe) MG tablet, TAKE 1 TABLET BY MOUTH DAILY, Disp: 90 tablet, Rfl: 1   finasteride (PROSCAR) 5 MG tablet, Take 5 mg by mouth daily., Disp: , Rfl:    furosemide (LASIX) 20 MG tablet, Take 1 tablet (20 mg total) by mouth daily., Disp: 90 tablet, Rfl: 1   LUMIGAN 0.01 % SOLN, Place 1 drop into both eyes at bedtime.,  Disp: , Rfl:    SIMBRINZA 1-0.2 % SUSP, Follow up with your PCP on when to restart this medication. (Patient taking differently: Place 1 drop into both eyes 3 (three) times daily.), Disp: , Rfl:    traMADol (ULTRAM) 50 MG tablet, Take 1 tablet (50 mg total) by mouth every 12 (twelve) hours as needed for moderate pain., Disp: , Rfl:    dapagliflozin propanediol ([name]) 10 MG TABS tablet, Take 1 tablet (10 mg total) by mouth daily before breakfast., Disp: 90 tablet, Rfl: 2   spironolactone (ALDACTONE) 50 MG tablet, Take 1 tablet (50 mg total) by mouth daily., Disp: 30 tablet, Rfl: 3   No Known Allergies   Men's preventive visit. Patient Health Questionnaire (PHQ-2) is  Flowsheet Row Office Visit from 04/27/2023 in [facility]  PHQ-2 Total Score 0     Patient is on a regular diet; feels like he is eating "pretty good. Reports he  is getting up and down. Marital status: Married. Relevant history for alcohol use is:  Social History   Substance and Sexual Activity  Alcohol Use No   Alcohol/week: 1.0 standard drink of alcohol   Types: 1 Cans of beer per week   Comment: h/o heavy use, quit in 2017  . Relevant history for tobacco use is:  Social History   Tobacco Use  Smoking Status Never  Smokeless Tobacco Never  .   Review of Systems  Constitutional: Negative.   HENT: Negative.    Eyes: Negative.   Respiratory: Negative.    Cardiovascular: Negative.  Negative for chest pain and palpitations.  Gastrointestinal: Negative.   Endocrine: Negative.   Genitourinary: Negative.   Musculoskeletal: Negative.   Skin: Negative.   Allergic/Immunologic: Negative.   Hematological: Negative.   Psychiatric/Behavioral: Negative.       Today's Vitals   04/27/23 1007  BP: 120/80  Pulse: 62  Temp: 98.7 F (37.1 C)  TempSrc: Oral  Weight: 133 lb 6.4 oz (60.5 kg)  Height: 5\' 10"  (1.778 m)  PainSc: 0-No pain   Body mass index is 19.14 kg/m.  Wt Readings from Last 3 Encounters:  04/27/23 133 lb 6.4 oz (60.5 kg)  02/09/23 120 lb 9.6 oz (54.7 kg)  01/29/23 114 lb 6.7 oz (51.9 kg)    Objective:  Physical Exam Vitals reviewed.  Constitutional:      General: He is not in acute distress.    Appearance: Normal appearance. He is normal weight.  HENT:     Head: Normocephalic and atraumatic.     Right Ear: Tympanic membrane, ear canal and external ear normal. There is no impacted cerumen.     Left Ear: Tympanic membrane, ear canal and external ear normal. There is no impacted cerumen.     Nose: Nose normal.     Mouth/Throat:     Mouth: Mucous membranes are moist.  Eyes:     Extraocular Movements: Extraocular movements intact.     Pupils: Pupils are equal, round, and reactive to light.  Cardiovascular:     Rate and Rhythm: Normal rate and regular rhythm.     Pulses: Normal pulses.     Heart sounds: Normal heart  sounds. No murmur heard. Pulmonary:     Effort: Pulmonary effort is normal. No respiratory distress.     Breath sounds: Normal breath sounds. No wheezing.  Abdominal:     General: Abdomen is flat. Bowel sounds are normal. There is no distension.     Palpations: Abdomen  is soft.  Musculoskeletal:        General: Normal range of motion.     Cervical back: Normal range of motion and neck supple.  Skin:    General: Skin is warm and dry.     Capillary Refill: Capillary refill takes less than 2 seconds.     Coloration: Skin is not jaundiced.     Findings: No bruising.  Neurological:     General: No focal deficit present.     Mental Status: He is alert and oriented to person, place, and time.     Cranial Nerves: No cranial nerve deficit.  Psychiatric:        Mood and Affect: Mood normal.        Behavior: Behavior normal.        Thought Content: Thought content normal.        Judgment: Judgment normal.         Assessment And Plan:    Encounter for annual health examination Assessment & Plan: Behavior modifications discussed and diet history reviewed.   Pt will continue to exercise regularly and modify diet with low GI, plant based foods and decrease intake of processed foods.  Recommend intake of daily multivitamin, Vitamin D, and calcium.  Recommend colonoscopy for preventive screenings, as well as recommend immunizations that include influenza, TDAP, and Shingles (declines)    Chronic diastolic CHF (congestive heart failure) (HCC) [I50.32] Assessment & Plan: Continue f/u with Cardiology.   Orders: -     Dapagliflozin Propanediol; Take 1 tablet (10 mg total) by mouth daily before breakfast.  Dispense: 90 tablet; Refill: 2 -     Spironolactone; Take 1 tablet (50 mg total) by mouth daily.  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     Stage 3a chronic kidney disease (HCC) Assessment & Plan: Will refer to Nephrology for further evaluation   Cirrhosis of liver with ascites, unspecified hepatic cirrhosis type (HCC) [K74.60, R18.8] Assessment & Plan: He is to continue f/u with GI for evaluation.    PVD (peripheral vascular disease) (HCC) Assessment & Plan: Continue statin, tolerating well   Coronary artery disease involving native coronary artery of native heart without angina pectoris Assessment & Plan: Continue f/u with Cardiology   Influenza vaccination declined Assessment & Plan: Patient declined influenza vaccination at this time. Patient is aware that influenza vaccine prevents illness in 70% of healthy people, and reduces hospitalizations to 30-70% in elderly. This vaccine is recommended annually. Education has been provided regarding the importance of this vaccine but patient still declined. Advised may receive this vaccine at local pharmacy or Health Dept.or vaccine clinic. Aware to provide a copy of the vaccination record if obtained from local pharmacy or Health Dept.  Pt is willing to accept risk associated with refusing vaccination.    Herpes zoster vaccination declined Assessment & Plan: Declines shingrix, educated on disease process and is aware if he changes his mind to notify office    COVID-19 vaccination declined Assessment & Plan: Declines covid 19 vaccine. Discussed risk of covid 41 and if he changes her mind about the vaccine to call the office. Education has been provided regarding the importance of this vaccine but patient still declined. Advised may receive this vaccine at local pharmacy or Health Dept.or vaccine clinic. Aware to provide a copy of the vaccination record if obtained from local pharmacy or Health Dept.  Encouraged to take multivitamin, vitamin d, vitamin c and zinc to increase immune system. Aware can call office if would like to have vaccine here at office. Verbalized  acceptance and understanding.    Body mass index (BMI) 19.9 or less, adult  Other long term (current) drug therapy     Return for 1 year physical, controlled DM check 4 months. Patient was given opportunity to ask questions. Patient verbalized understanding of the plan and was able to repeat key elements of the plan. All questions were answered to their satisfaction.   Trevor Felts, FNP  I, Trevor Felts, FNP, have reviewed all documentation for this visit. The documentation on 04/27/23 for the exam, diagnosis, procedures, and orders are all accurate and complete.

## 2023-04-27 ENCOUNTER — Encounter: Payer: Self-pay | Admitting: Nurse Practitioner

## 2023-04-27 ENCOUNTER — Ambulatory Visit (INDEPENDENT_AMBULATORY_CARE_PROVIDER_SITE_OTHER): Payer: 59 | Admitting: Nurse Practitioner

## 2023-04-27 VITALS — BP 120/80 | HR 62 | Temp 98.7°F | Ht 70.0 in | Wt 133.4 lb

## 2023-04-27 DIAGNOSIS — Z0000 Encounter for general adult medical examination without abnormal findings: Secondary | ICD-10-CM

## 2023-04-27 DIAGNOSIS — I5032 Chronic diastolic (congestive) heart failure: Secondary | ICD-10-CM

## 2023-04-27 DIAGNOSIS — Z79899 Other long term (current) drug therapy: Secondary | ICD-10-CM | POA: Diagnosis not present

## 2023-04-27 DIAGNOSIS — E875 Hyperkalemia: Secondary | ICD-10-CM

## 2023-04-27 DIAGNOSIS — I130 Hypertensive heart and chronic kidney disease with heart failure and stage 1 through stage 4 chronic kidney disease, or unspecified chronic kidney disease: Secondary | ICD-10-CM | POA: Diagnosis not present

## 2023-04-27 DIAGNOSIS — K7460 Unspecified cirrhosis of liver: Secondary | ICD-10-CM | POA: Diagnosis not present

## 2023-04-27 DIAGNOSIS — Z681 Body mass index (BMI) 19 or less, adult: Secondary | ICD-10-CM | POA: Diagnosis not present

## 2023-04-27 DIAGNOSIS — N1831 Chronic kidney disease, stage 3a: Secondary | ICD-10-CM

## 2023-04-27 DIAGNOSIS — R188 Other ascites: Secondary | ICD-10-CM

## 2023-04-27 DIAGNOSIS — Z2821 Immunization not carried out because of patient refusal: Secondary | ICD-10-CM

## 2023-04-27 DIAGNOSIS — I739 Peripheral vascular disease, unspecified: Secondary | ICD-10-CM

## 2023-04-27 DIAGNOSIS — I2510 Atherosclerotic heart disease of native coronary artery without angina pectoris: Secondary | ICD-10-CM

## 2023-04-27 MED ORDER — SPIRONOLACTONE 50 MG PO TABS: 50.0000 mg | ORAL_TABLET | Freq: Every day | ORAL | 3 refills | Status: DC

## 2023-04-27 MED ORDER — DAPAGLIFLOZIN PROPANEDIOL 10 MG PO TABS
10.0000 mg | ORAL_TABLET | Freq: Every day | ORAL | 2 refills | Status: DC
Start: 2023-04-27 — End: 2024-02-21

## 2023-04-29 ENCOUNTER — Ambulatory Visit: Payer: Self-pay

## 2023-04-29 NOTE — Patient Instructions (Signed)
Visit Information  Thank you for taking time to visit with me today. Please don't hesitate to contact me if I can be of assistance to you.   Following are the goals we discussed today:   Goals Addressed               This Visit's Progress     Patient Stated     To start daily weights (pt-stated)   On track     Care Coordination Interventions: Provided education on low sodium diet Reviewed Heart Failure Action Plan in depth and provided written copy Discussed importance of daily weight and advised patient to weigh and record daily         Other     To increase water intake and start [name]   On track     Care Coordination Interventions: Assessed the sister [name] understanding of chronic kidney disease    Evaluation of current treatment plan related to chronic kidney disease self management and patient's adherence to plan as established by provider      Reviewed prescribed diet increase water intake to 48 oz daily  Provided education on kidney disease progression    Determined patient has increased his daily water intake as directed  Last practice recorded BP readings:  BP Readings from Last 3 Encounters:  04/27/23 120/80  02/09/23 (!) 140/80  02/04/23 110/60   Most recent eGFR/CrCl:  Lab Results  Component Value Date   EGFR 41 (L) 02/04/2023    No components found for: "CRCL"        To reduce number of falls   On track     Care Coordination Interventions: Placed successful outbound call to sister Roderic Scarce  Provided written and verbal education re: potential causes of falls and Fall prevention strategies Reviewed medications and discussed potential side effects of medications such as dizziness and frequent urination Advised patient of importance of notifying provider of falls Assessed for signs and symptoms of orthostatic hypotension Assessed for falls since last encounter        Our next appointment is by telephone on 06/29/23 at 1:30 PM   Please call the  care guide team at [phone] if you need to cancel or reschedule your appointment.   If you are experiencing a Mental Health or Behavioral Health Crisis or need someone to talk to, please call 1-800-273-TALK (toll free, 24 hour hotline)  The patient verbalized understanding of instructions, educational materials, and care plan provided today and DECLINED offer to receive copy of patient instructions, educational materials, and care plan.   Delsa Sale RN BSN CCM [place]  [facility], [facility] Health Nurse Care Coordinator  Direct Dial: [phone] Website: [email or web address]

## 2023-04-29 NOTE — Patient Outreach (Signed)
Care Coordination   Follow Up Visit Note   04/29/2023 Name: Trevor Murphy MRN: [id number] DOB: 07-12-1935  Trevor Murphy is a 88 y.o. year old male who sees [name], FNP for primary care. I spoke with  [name] by phone today.  What matters to the patients health and wellness today?  Patient will continue to work on increasing his daily water intake. Sister would like for patient to stay healthy and out of the hospital.     Goals Addressed               This Visit's Progress     Patient Stated     To start daily weights (pt-stated)   On track     Care Coordination Interventions: Provided education on low sodium diet Reviewed Heart Failure Action Plan in depth and provided written copy Discussed importance of daily weight and advised patient to weigh and record daily      Other     To increase water intake and start [name]   On track     Care Coordination Interventions: Assessed the sister [name] understanding of chronic kidney disease    Evaluation of current treatment plan related to chronic kidney disease self management and patient's adherence to plan as established by provider      Reviewed prescribed diet increase water intake to 48 oz daily  Provided education on kidney disease progression    Determined patient has increased his daily water intake as directed  Last practice recorded BP readings:  BP Readings from Last 3 Encounters:  04/27/23 120/80  02/09/23 (!) 140/80  02/04/23 110/60   Most recent eGFR/CrCl:  Lab Results  Component Value Date   EGFR 41 (L) 02/04/2023    No components found for: "CRCL"      To reduce number of falls   On track     Care Coordination Interventions: Placed successful outbound call to sister Roderic Scarce  Provided written and verbal education re: potential causes of falls and Fall prevention strategies Reviewed medications and discussed potential side effects of medications such as dizziness and frequent urination Advised  patient of importance of notifying provider of falls Assessed for signs and symptoms of orthostatic hypotension Assessed for falls since last encounter    Interventions Today    Flowsheet Row Most Recent Value  Chronic Disease   Chronic disease during today's visit Chronic Kidney Disease/End Stage Renal Disease (ESRD), Hypertension (HTN), Other  [impaired physical activity]  General Interventions   General Interventions Discussed/Reviewed General Interventions Discussed, General Interventions Reviewed, Doctor Visits, Labs  Doctor Visits Discussed/Reviewed Doctor Visits Discussed, Doctor Visits Reviewed, PCP  Exercise Interventions   Exercise Discussed/Reviewed Physical Activity  Education Interventions   Education Provided Provided Education  Provided Verbal Education On Nutrition, When to see the doctor, Labs  Nutrition Interventions   Nutrition Discussed/Reviewed Nutrition Discussed, Nutrition Reviewed, Fluid intake  Safety Interventions   Safety Discussed/Reviewed Fall Risk, Safety Reviewed, Safety Discussed, Home Safety  Home Safety Assistive Devices          SDOH assessments and interventions completed:  No     Care Coordination Interventions:  Yes, provided   Follow up plan: Follow up call scheduled for 06/29/23 @1 :30 PM    Encounter Outcome:  Patient Visit Completed

## 2023-05-04 ENCOUNTER — Other Ambulatory Visit: Payer: Self-pay | Admitting: Nurse Practitioner

## 2023-05-04 DIAGNOSIS — N1832 Chronic kidney disease, stage 3b: Secondary | ICD-10-CM

## 2023-05-09 ENCOUNTER — Encounter: Payer: Self-pay | Admitting: Nurse Practitioner

## 2023-05-09 DIAGNOSIS — Z0000 Encounter for general adult medical examination without abnormal findings: Secondary | ICD-10-CM | POA: Insufficient documentation

## 2023-05-09 DIAGNOSIS — Z2821 Immunization not carried out because of patient refusal: Secondary | ICD-10-CM | POA: Insufficient documentation

## 2023-05-09 DIAGNOSIS — N1831 Chronic kidney disease, stage 3a: Secondary | ICD-10-CM | POA: Insufficient documentation

## 2023-05-09 NOTE — Assessment & Plan Note (Signed)
Behavior modifications discussed and diet history reviewed.   Pt will continue to exercise regularly and modify diet with low GI, plant based foods and decrease intake of processed foods.  Recommend intake of daily multivitamin, Vitamin D, and calcium.  Recommend colonoscopy for preventive screenings, as well as recommend immunizations that include influenza, TDAP, and Shingles (declines)

## 2023-05-09 NOTE — Assessment & Plan Note (Signed)
Declines shingrix, educated on disease process and is aware if he changes his mind to notify office  

## 2023-05-09 NOTE — Assessment & Plan Note (Signed)
He is to continue f/u with GI for evaluation.

## 2023-05-09 NOTE — Assessment & Plan Note (Signed)

## 2023-05-09 NOTE — Assessment & Plan Note (Signed)
>>  ASSESSMENT AND PLAN FOR STAGE 3A CHRONIC KIDNEY DISEASE (HCC) WRITTEN ON 05/09/2023 10:20 PM BY Susanna Epley, FNP  Will refer to Nephrology for further evaluation

## 2023-05-09 NOTE — Assessment & Plan Note (Signed)
Will refer to Nephrology for further evaluation

## 2023-05-09 NOTE — Assessment & Plan Note (Signed)
Blood pressure is controlled, continue current medications

## 2023-05-09 NOTE — Assessment & Plan Note (Signed)
Continue f/u with Cardiology.

## 2023-05-09 NOTE — Assessment & Plan Note (Signed)

## 2023-05-09 NOTE — Assessment & Plan Note (Signed)
Continue statin, tolerating well 

## 2023-05-12 LAB — CUP PACEART REMOTE DEVICE CHECK
Date Time Interrogation Session: 20240930231113
Implantable Pulse Generator Implant Date: 20201014

## 2023-05-17 ENCOUNTER — Ambulatory Visit: Payer: 59

## 2023-05-17 DIAGNOSIS — I639 Cerebral infarction, unspecified: Secondary | ICD-10-CM

## 2023-06-02 NOTE — Progress Notes (Signed)
Carelink Summary Report / Loop Recorder 

## 2023-06-16 LAB — CUP PACEART REMOTE DEVICE CHECK
Date Time Interrogation Session: 20241102230034
Implantable Pulse Generator Implant Date: 20201014

## 2023-06-21 ENCOUNTER — Ambulatory Visit: Payer: 59

## 2023-06-21 DIAGNOSIS — I639 Cerebral infarction, unspecified: Secondary | ICD-10-CM | POA: Diagnosis not present

## 2023-06-24 ENCOUNTER — Emergency Department (HOSPITAL_COMMUNITY)
Admission: EM | Admit: 2023-06-24 | Discharge: 2023-06-24 | Disposition: A | Discharge status: Home / Self Care | Payer: 59 | Attending: Emergency Medicine | Admitting: Emergency Medicine

## 2023-06-24 ENCOUNTER — Other Ambulatory Visit: Payer: Self-pay

## 2023-06-24 ENCOUNTER — Encounter (HOSPITAL_COMMUNITY): Payer: Self-pay | Admitting: Emergency Medicine

## 2023-06-24 ENCOUNTER — Emergency Department (HOSPITAL_COMMUNITY): Payer: 59

## 2023-06-24 DIAGNOSIS — S32501A Unspecified fracture of right pubis, initial encounter for closed fracture: Secondary | ICD-10-CM | POA: Insufficient documentation

## 2023-06-24 DIAGNOSIS — W19XXXA Unspecified fall, initial encounter: Secondary | ICD-10-CM | POA: Insufficient documentation

## 2023-06-24 DIAGNOSIS — M25551 Pain in right hip: Secondary | ICD-10-CM

## 2023-06-24 DIAGNOSIS — M1611 Unilateral primary osteoarthritis, right hip: Secondary | ICD-10-CM | POA: Diagnosis not present

## 2023-06-24 DIAGNOSIS — M160 Bilateral primary osteoarthritis of hip: Secondary | ICD-10-CM | POA: Diagnosis not present

## 2023-06-24 DIAGNOSIS — S32511A Fracture of superior rim of right pubis, initial encounter for closed fracture: Secondary | ICD-10-CM | POA: Diagnosis not present

## 2023-06-24 DIAGNOSIS — S79911A Unspecified injury of right hip, initial encounter: Secondary | ICD-10-CM | POA: Diagnosis present

## 2023-06-24 DIAGNOSIS — S32591A Other specified fracture of right pubis, initial encounter for closed fracture: Secondary | ICD-10-CM | POA: Diagnosis not present

## 2023-06-24 DIAGNOSIS — M85851 Other specified disorders of bone density and structure, right thigh: Secondary | ICD-10-CM | POA: Diagnosis not present

## 2023-06-24 DIAGNOSIS — S32599A Other specified fracture of unspecified pubis, initial encounter for closed fracture: Secondary | ICD-10-CM

## 2023-06-24 DIAGNOSIS — N179 Acute kidney failure, unspecified: Secondary | ICD-10-CM | POA: Diagnosis not present

## 2023-06-24 LAB — CBC
HCT: 48.2 % (ref 39.0–52.0)
Hemoglobin: 15 g/dL (ref 13.0–17.0)
MCH: 30.7 pg (ref 26.0–34.0)
MCHC: 31.1 g/dL (ref 30.0–36.0)
MCV: 98.8 fL (ref 80.0–100.0)
Platelets: 62 10*3/uL — ABNORMAL LOW (ref 150–400)
RBC: 4.88 MIL/uL (ref 4.22–5.81)
RDW: 16.5 % — ABNORMAL HIGH (ref 11.5–15.5)
WBC: 4.6 10*3/uL (ref 4.0–10.5)
nRBC: 0 % (ref 0.0–0.2)

## 2023-06-24 LAB — BASIC METABOLIC PANEL
Anion gap: 8 (ref 5–15)
BUN: 37 mg/dL — ABNORMAL HIGH (ref 8–23)
CO2: 19 mmol/L — ABNORMAL LOW (ref 22–32)
Calcium: 8.2 mg/dL — ABNORMAL LOW (ref 8.9–10.3)
Chloride: 108 mmol/L (ref 98–111)
Creatinine, Ser: 2.71 mg/dL — ABNORMAL HIGH (ref 0.61–1.24)
GFR, Estimated: 22 mL/min — ABNORMAL LOW (ref >60)
Glucose, Bld: 119 mg/dL — ABNORMAL HIGH (ref 70–99)
Potassium: 5.2 mmol/L — ABNORMAL HIGH (ref 3.5–5.1)
Sodium: 135 mmol/L (ref 135–145)

## 2023-06-24 MED ORDER — SODIUM CHLORIDE 0.9 % IV BOLUS
1000.0000 mL | Freq: Once | INTRAVENOUS | Status: AC
  Administered 2023-06-24: 1000 mL via INTRAVENOUS

## 2023-06-24 MED ORDER — FENTANYL CITRATE PF 50 MCG/ML IJ SOSY
50.0000 ug | PREFILLED_SYRINGE | Freq: Once | INTRAMUSCULAR | Status: AC
  Administered 2023-06-24: 50 ug via INTRAVENOUS
  Filled 2023-06-24: qty 1

## 2023-06-24 MED ORDER — OXYCODONE HCL 5 MG PO TABS: 5.0000 mg | ORAL_TABLET | Freq: Three times a day (TID) | ORAL | 0 refills | Status: AC | PRN

## 2023-06-24 NOTE — ED Triage Notes (Signed)
Pt reports tripping and falling yesterday. Pt C/O right hip pain.

## 2023-06-24 NOTE — Discharge Instructions (Addendum)
Weight bear as tolerated. Call and follow up with orthopedics who will help recommend/prescribe physical therapy. Can use wheel chair but walker is better to prevent blood clots.

## 2023-06-24 NOTE — ED Provider Notes (Signed)
[place] EMERGENCY DEPARTMENT AT [facility] Provider Note   CSN: [id number] Arrival date & time: 06/24/23  1021     History  Chief Complaint  Patient presents with   Trevor Murphy is a 88 y.o. male with history of blindness presenting to ED with a mechanical fall that occurred yesterday, landing on his right hip and his bottom.  He reports pain in his right hip and difficulty bearing weight.  He typically lives by himself and his family that checks in on him.  He is not on blood thinners or anticoagulation.  HPI     Home Medications Prior to Admission medications   Medication Sig Start Date End Date Taking? Authorizing Provider  acetaminophen (TYLENOL) 500 MG tablet Take 1,000 mg by mouth 2 (two) times daily as needed for mild pain or moderate pain (leg pain).    [provider]  albuterol (VENTOLIN HFA) 108 (90 Base) MCG/ACT inhaler INHALE 1 TO 2 PUFFS INTO THE LUNGS EVERY 6 HOURS AS NEEDED FOR WHEEZING OR SHORTNESS OF BREATH 03/29/23   Ellender Hose, NP  carvedilol (COREG) 3.125 MG tablet Take 1 tablet (3.125 mg total) by mouth 2 (two) times daily with a meal. 02/03/23 05/04/23  Narda Bonds, MD  dapagliflozin propanediol ([name]) 10 MG TABS tablet Take 1 tablet (10 mg total) by mouth daily before breakfast. 04/27/23   [name], FNP  FEROSUL 325 (65 Fe) MG tablet TAKE 1 TABLET BY MOUTH DAILY 04/19/23   [name], FNP  finasteride (PROSCAR) 5 MG tablet Take 5 mg by mouth daily. 03/09/22   [provider]  furosemide (LASIX) 20 MG tablet Take 1 tablet (20 mg total) by mouth daily. 11/27/22   Almon Hercules, MD  LUMIGAN 0.01 % SOLN Place 1 drop into both eyes at bedtime. 08/28/19   [provider]  SIMBRINZA 1-0.2 % SUSP Follow up with your PCP on when to restart this medication. Patient taking differently: Place 1 drop into both eyes 3 (three) times daily. 06/09/22   Narda Bonds, MD  spironolactone (ALDACTONE) 50 MG tablet Take 1  tablet (50 mg total) by mouth daily. 04/27/23   [name], FNP  traMADol (ULTRAM) 50 MG tablet Take 1 tablet (50 mg total) by mouth every 12 (twelve) hours as needed for moderate pain. 02/02/23   Narda Bonds, MD      Allergies    Patient has no known allergies.    Review of Systems   Review of Systems  Physical Exam Updated Vital Signs BP (!) 145/59   Pulse (!) 54   Temp 98.1 F (36.7 C) (Oral)   Resp 20   SpO2 100%  Physical Exam Constitutional:      General: He is not in acute distress.    Comments: Blind  HENT:     Head: Normocephalic and atraumatic.  Eyes:     Conjunctiva/sclera: Conjunctivae normal.     Pupils: Pupils are equal, round, and reactive to light.  Cardiovascular:     Rate and Rhythm: Normal rate and regular rhythm.  Pulmonary:     Effort: Pulmonary effort is normal. No respiratory distress.  Abdominal:     General: There is no distension.     Tenderness: There is no abdominal tenderness.  Musculoskeletal:     Comments: Difficulty bearing weight on right leg with ambulation, tenderness with attempted range of motion at the right hip, no visible deformity of the lower extremities  Skin:    General: Skin is warm and dry.  Neurological:     General: No focal deficit present.     Mental Status: He is alert and oriented to person, place, and time. Mental status is at baseline.  Psychiatric:        Mood and Affect: Mood normal.        Behavior: Behavior normal.     ED Results / Procedures / Treatments   Labs (all labs ordered are listed, but only abnormal results are displayed) Labs Reviewed  BASIC METABOLIC PANEL - Abnormal; Notable for the following components:      Result Value   Potassium 5.2 (*)    CO2 19 (*)    Glucose, Bld 119 (*)    BUN 37 (*)    Creatinine, Ser 2.71 (*)    Calcium 8.2 (*)    GFR, Estimated 22 (*)    All other components within normal limits  CBC - Abnormal; Notable for the following components:   RDW 16.5 (*)     Platelets 62 (*)    All other components within normal limits    EKG None  Radiology DG Hip Unilat  With Pelvis 2-3 Views Right  Result Date: 06/24/2023 CLINICAL DATA:  Right hip pain after fall. EXAM: DG HIP (WITH OR WITHOUT PELVIS) 2-3V RIGHT COMPARISON:  CT abdomen/pelvis dated January 27, 2023. FINDINGS: Osteopenia. Subtle cortical step-off at the superior cortex of the right superior pubic ramus. The femoral heads are seated within the acetabula. Degenerative changes of the bilateral hips manifested by joint space narrowing and marginal osteophytosis. Vascular calcifications are noted. IMPRESSION: 1. Subtle cortical step-off at the right superior pubic ramus is equivocal for fracture. Further evaluation with CT is recommended. 2. Osteopenia.  No additional fracture identified. 3. Moderate osteoarthritis of the hips. Electronically Signed   By: Hart Robinsons M.D.   On: 06/24/2023 14:01    Procedures Procedures    Medications Ordered in ED Medications  sodium chloride 0.9 % bolus 1,000 mL (1,000 mLs Intravenous New Bag/Given 06/24/23 1456)    ED Course/ Medical Decision Making/ A&P Clinical Course as of 06/24/23 1525  Thu Jun 24, 2023  1524 Patient signed out to Dr Edwin Dada EDP pending admission for potential hip fx, CT imaging, and AKI [MT]    Clinical Course User Index [MT] Terald Sleeper, MD                                 Medical Decision Making Amount and/or Complexity of Data Reviewed Labs: ordered. Radiology: ordered.   Patient is presenting with reported mechanical fall yesterday evening now complaining of right hip pain.  No evidence or report head injury.  Doubt spinal fracture.  X-ray of hips ordered reviewed interpreted, showed potential pubic rami fracture.  CT imaging pending at signout  Labs with Cr and BUN and K elevation - likely 2/2 dehydration.  IV fluids ordered        Final Clinical Impression(s) / ED Diagnoses Final diagnoses:  Fall,  initial encounter  AKI (acute kidney injury) (HCC)  Right hip pain    Rx / DC Orders ED Discharge Orders     None         Bryanne Riquelme, Kermit Balo, MD 06/24/23 1525

## 2023-06-24 NOTE — ED Provider Notes (Addendum)
3:13 PM Patient signed out to me by previous ED physician. Pt is a 88 yo male presenting for right hip pain after fall. Blind.   AKI present. Xray demonstrates possible "subtle cortical setp-off at right superior pubic ramus fracture. CT pending.   Physical Exam  BP (!) 145/59   Pulse (!) 54   Temp 98.1 F (36.7 C) (Oral)   Resp 20   SpO2 100%   Physical Exam  Procedures  Procedures  ED Course / MDM   Clinical Course as of 06/24/23 2023  Thu Jun 24, 2023  1524 Patient signed out to Dr Edwin Dada EDP pending admission for potential hip fx, CT imaging, and AKI [MT]    Clinical Course User Index [MT] Terald Sleeper, MD   Medical Decision Making Amount and/or Complexity of Data Reviewed Labs: ordered. Radiology: ordered.  Risk Prescription drug management.   CT demonstrates: 1. Nondisplaced fracture of the right inferior pubic ramus. 2. Osteopenia.  No definite additional fracture identified. 3. Advanced osteoarthritis of the right hip.  Patient remains neurovascularly intact. Will be safe for discharge home with pain medication prescription and follow up with orthopedic surgery. Patient has family that will stay with him at home and has a wheel chair he can use. This information was confirmed with his son who is on his way to pick up.   Patient in no distress and overall condition improved here in the ED. Detailed discussions were had with the patient regarding current findings, and need for close f/u with PCP or on call doctor. The patient has been instructed to return immediately if the symptoms worsen in any way for re-evaluation. Patient verbalized understanding and is in agreement with current care plan. All questions answered prior to discharge.      Franne Forts, DO 06/24/23 2024    Franne Forts, DO 06/24/23 2031

## 2023-06-29 ENCOUNTER — Ambulatory Visit: Payer: Self-pay

## 2023-06-30 DIAGNOSIS — S32501A Unspecified fracture of right pubis, initial encounter for closed fracture: Secondary | ICD-10-CM | POA: Diagnosis not present

## 2023-06-30 NOTE — Patient Outreach (Signed)
  Care Coordination   Follow Up Visit Note   06/29/2023 Name: Trevor Murphy  Trevor Murphy is a 88 y.o. year old male who sees Trevor Felts, FNP for primary care. I spoke with patient's [name] by phone today.  What matters to the patients health and wellness today?  Patient would like to  have his right leg pain evaluated by Orthopedics following a recent fall.     Goals Addressed             This Visit's Progress    To reduce number of falls   Not on track    Care Coordination Interventions: Completed successful outbound call with sister [name]  Assessed for falls since last encounter Determined patient experienced a fall in his home experiencing an injury to his right pubic region, resulting in an ED visit Assessed for signs and symptoms of orthostatic hypotension Determined patient may have experienced a syncope episode, however patient's son who witnessed the fall was unsure of the cause Review of patient status, including review of consultant's reports, relevant laboratory and other test results, informed sister, patient's kidney function has declined since last check, concerning for a UTI, the ED did not check a urine specimen Determined patient has a follow up with [facility] on 06/30/23 am for evaluation of his right pubic fracture and leg pain Instructed [name] to schedule a follow up with patient's PCP and request a urine check with this visit, she verbalizes understanding and states she plans to stop by the office in person to schedule a f/u Sent in basket message to PCP provider Trevor Felts, FNP notifying her of patient's recent ED visit with injury and potential UTI [name] will scheduled a post ED follow up with PCP and request a urine check Patient will keep his scheduled orthopedic MD follow up scheduled for 06/30/23 for evaluation of his right pubic fx and leg pain Patient will continue use his walker for ambulation  and use fall precautions Patient and [name] will continue to work with nurse care coordinator for chronic disease management and care coordination needs     Interventions Today    Flowsheet Row Most Recent Value  Chronic Disease   Chronic disease during today's visit Other  [s/p fall w/pubic ramus fracture]  General Interventions   General Interventions Discussed/Reviewed General Interventions Discussed, General Interventions Reviewed, Labs, Doctor Visits, Communication with  Doctor Visits Discussed/Reviewed Doctor Visits Discussed, Doctor Visits Reviewed, PCP, Specialist  Communication with PCP/Specialists  Trevor Felts FNP]  Exercise Interventions   Exercise Discussed/Reviewed Physical Activity  Physical Activity Discussed/Reviewed Physical Activity Reviewed, Physical Activity Discussed  Education Interventions   Education Provided Provided Education  Provided Verbal Education On When to see the doctor, Nutrition, Labs  Labs Reviewed Kidney Function  Nutrition Interventions   Nutrition Discussed/Reviewed Nutrition Discussed, Nutrition Reviewed, Fluid intake          SDOH assessments and interventions completed:  No     Care Coordination Interventions:  Yes, provided   Follow up plan: Follow up call scheduled for 07/07/23 @10 :30 AM    Encounter Outcome:  Patient Visit Completed

## 2023-06-30 NOTE — Patient Instructions (Signed)
Visit Information  Thank you for taking time to visit with me today. Please don't hesitate to contact me if I can be of assistance to you.   Following are the goals we discussed today:   Goals Addressed             This Visit's Progress    To reduce number of falls   Not on track    Care Coordination Interventions: Completed successful outbound call with sister [name]  Assessed for falls since last encounter Determined patient experienced a fall in his home experiencing an injury to his right pubic region, resulting in an ED visit Assessed for signs and symptoms of orthostatic hypotension Determined patient may have experienced a syncope episode, however patient's son who witnessed the fall was unsure of the cause Review of patient status, including review of consultant's reports, relevant laboratory and other test results, informed sister, patient's kidney function has declined since last check, concerning for a UTI, the ED did not check a urine specimen Determined patient has a follow up with [facility] on 06/30/23 am for evaluation of his right pubic fracture and leg pain Instructed sister Roderic Scarce to schedule a follow up with patient's PCP and request a urine check with this visit, she verbalizes understanding and states she plans to stop by the office in person to schedule a f/u Sent in basket message to PCP provider [name], FNP notifying her of patient's recent ED visit with injury and potential UTI Sister Roderic Scarce will scheduled a post ED follow up with PCP and request a urine check Patient will keep his scheduled orthopedic MD follow up scheduled for 06/30/23 for evaluation of his right pubic fx and leg pain Patient will continue use his walker for ambulation and use fall precautions Patient and sister Roderic Scarce will continue to work with nurse care coordinator for chronic disease management and care coordination needs            Our next appointment is by telephone on  07/07/23 at 10:30 AM  Please call the care guide team at [phone] if you need to cancel or reschedule your appointment.   If you are experiencing a Mental Health or Behavioral Health Crisis or need someone to talk to, please call 1-800-273-TALK (toll free, 24 hour hotline)  The patient verbalized understanding of instructions, educational materials, and care plan provided today and DECLINED offer to receive copy of patient instructions, educational materials, and care plan.   Delsa Sale RN BSN CCM [place]  [facility] Nurse Care Coordinator  Direct Dial: [phone] Website: [email or web address]

## 2023-07-05 ENCOUNTER — Encounter: Payer: Self-pay | Admitting: Nurse Practitioner

## 2023-07-05 ENCOUNTER — Ambulatory Visit: Payer: 59 | Admitting: Nurse Practitioner

## 2023-07-05 ENCOUNTER — Encounter: Payer: Self-pay | Admitting: Podiatry

## 2023-07-05 ENCOUNTER — Ambulatory Visit (INDEPENDENT_AMBULATORY_CARE_PROVIDER_SITE_OTHER): Payer: 59 | Admitting: Podiatry

## 2023-07-05 VITALS — BP 100/68 | HR 58 | Temp 99.0°F | Ht 70.0 in

## 2023-07-05 DIAGNOSIS — I739 Peripheral vascular disease, unspecified: Secondary | ICD-10-CM | POA: Diagnosis not present

## 2023-07-05 DIAGNOSIS — N184 Chronic kidney disease, stage 4 (severe): Secondary | ICD-10-CM

## 2023-07-05 DIAGNOSIS — S32599D Other specified fracture of unspecified pubis, subsequent encounter for fracture with routine healing: Secondary | ICD-10-CM | POA: Diagnosis not present

## 2023-07-05 DIAGNOSIS — M79675 Pain in left toe(s): Secondary | ICD-10-CM

## 2023-07-05 DIAGNOSIS — W19XXXD Unspecified fall, subsequent encounter: Secondary | ICD-10-CM | POA: Diagnosis not present

## 2023-07-05 DIAGNOSIS — Z2821 Immunization not carried out because of patient refusal: Secondary | ICD-10-CM | POA: Diagnosis not present

## 2023-07-05 DIAGNOSIS — B351 Tinea unguium: Secondary | ICD-10-CM | POA: Diagnosis not present

## 2023-07-05 DIAGNOSIS — W19XXXA Unspecified fall, initial encounter: Secondary | ICD-10-CM

## 2023-07-05 DIAGNOSIS — M79674 Pain in right toe(s): Secondary | ICD-10-CM

## 2023-07-05 HISTORY — DX: Other specified fracture of unspecified pubis, subsequent encounter for fracture with routine healing: S32.599D

## 2023-07-05 HISTORY — DX: Unspecified fall, initial encounter: W19.XXXA

## 2023-07-05 MED ORDER — OXYCODONE-ACETAMINOPHEN 5-325 MG PO TABS: 1.0000 | ORAL_TABLET | Freq: Four times a day (QID) | ORAL | 0 refills | Status: DC | PRN

## 2023-07-05 NOTE — Assessment & Plan Note (Signed)
Has seen orthopedics and is to start PT however patient is not ready yet

## 2023-07-05 NOTE — Assessment & Plan Note (Signed)
Has appt with Nephrology next week, his kidney functions declined significantly at his last office visit. Will repeat levels. Encouraged to stay well hydrated with water and to avoid foods that may increase levels. Sister reports he does not eat much when he does

## 2023-07-05 NOTE — Progress Notes (Signed)
Madelaine Bhat, CMA,acting as a Neurosurgeon for [name], FNP.,have documented all relevant documentation on the behalf of [name], FNP,as directed by  [name], FNP while in the presence of [name], FNP.  Subjective:  Patient ID: Trevor Murphy , male    DOB: 30-Jan-1935 , 88 y.o.   MRN: [id number]  Chief Complaint  Patient presents with   Hospitalization Follow-up    HPI  Patient presents today for a ED follow up, Patient reports compliance with medication. Patient denies any chest pain, SOB, or headaches. Patient went to the hospital on 06/24/2023 for a fall, patient does have a broken pelvis.  Patient reports he is feeling rough, he is having a lot of pain. Patient reports he fell going to the bathroom, he states his feet got caught in the wheelchair.   He has seen the orthopedic provider. He will start PT when he is ready to go. He was on oxycodone but did not feel he had relief. He has not received his rollator walker but may be in the office.      Past Medical History:  Diagnosis Date   Arthritis    CAD (coronary artery disease)    a. Cath 08/2009: minimal nonobstructive disease (25% LAD). b. Lexiscan nuc 6/215: EF 54%, no ischemia or infarction.   Chronic diastolic CHF (congestive heart failure) (HCC)    a. Echo 2013: EF 55-60%, mild AI, mild MR. b. Echo 5/15 with EF 60-65%, no WMA, mild LVH, mild AI.   CKD (chronic kidney disease) stage 3, GFR 30-59 ml/min (HCC) 07/26/2012   a. Felt likely due to HTN.   CVA (cerebral infarction) 07/28/2012   Glaucoma    Gout    H/O umbilical hernia repair    Headache(784.0)    " WHEN MY BLOOD PRESSURE IS UP"   History of acute renal failure    History of alcohol abuse    HTN (hypertension)    Hypertension    a. Patent renal arteries 2005.   Noncompliance    OA (osteoarthritis)    Other and unspecified hyperlipidemia    Poor circulation    Sinus bradycardia    SVT (supraventricular tachycardia) (HCC)    a. Possible AVNRT.   Got adenosine from EMS in 2015 for SVT.  During 5/15 hospitalization had episode of short R-P tachycardia (possible AVNRT) terminated by adenosine.  b. 04/2016 - recurrent tachycardia (SVT vs. AVNRT), again terminated by Adenosine   Visual impairment      Family History  Problem Relation Age of Onset   CVA Mother    Hypertension Brother    Hypertension Sister    Hypertension Brother    Stroke Sister    Heart attack Neg Hx      Current Outpatient Medications:    acetaminophen (TYLENOL) 500 MG tablet, Take 1,000 mg by mouth 2 (two) times daily as needed for mild pain or moderate pain (leg pain)., Disp: , Rfl:    albuterol (VENTOLIN HFA) 108 (90 Base) MCG/ACT inhaler, INHALE 1 TO 2 PUFFS INTO THE LUNGS EVERY 6 HOURS AS NEEDED FOR WHEEZING OR SHORTNESS OF BREATH, Disp: 6.7 g, Rfl: 0   dapagliflozin propanediol ([name]) 10 MG TABS tablet, Take 1 tablet (10 mg total) by mouth daily before breakfast., Disp: 90 tablet, Rfl: 2   FEROSUL 325 (65 Fe) MG tablet, TAKE 1 TABLET BY MOUTH DAILY, Disp: 90 tablet, Rfl: 1   finasteride (PROSCAR) 5 MG tablet, Take 5 mg by mouth daily., Disp: ,  Rfl:    furosemide (LASIX) 20 MG tablet, Take 1 tablet (20 mg total) by mouth daily., Disp: 90 tablet, Rfl: 1   LUMIGAN 0.01 % SOLN, Place 1 drop into both eyes at bedtime., Disp: , Rfl:    oxyCODONE-acetaminophen (PERCOCET/ROXICET) 5-325 MG tablet, Take 1 tablet by mouth every 6 (six) hours as needed for severe pain (pain score 7-10)., Disp: 30 tablet, Rfl: 0   SIMBRINZA 1-0.2 % SUSP, Follow up with your PCP on when to restart this medication. (Patient taking differently: Place 1 drop into both eyes 3 (three) times daily.), Disp: , Rfl:    spironolactone (ALDACTONE) 50 MG tablet, Take 1 tablet (50 mg total) by mouth daily., Disp: 30 tablet, Rfl: 3   carvedilol (COREG) 3.125 MG tablet, Take 1 tablet (3.125 mg total) by mouth 2 (two) times daily with a meal., Disp: 60 tablet, Rfl: 2   No Known Allergies   Review of  Systems  Constitutional: Negative.   HENT: Negative.    Eyes: Negative.   Respiratory: Negative.    Cardiovascular: Negative.   Gastrointestinal: Negative.      Today's Vitals   07/05/23 1129  BP: 100/68  Pulse: (!) 58  Temp: 99 F (37.2 C)  TempSrc: Oral  Height: 5\' 10"  (1.778 m)  PainSc: 8   PainLoc: Groin   Body mass index is 19.37 kg/m.  Wt Readings from Last 3 Encounters:  06/24/23 135 lb (61.2 kg)  04/27/23 133 lb 6.4 oz (60.5 kg)  02/09/23 120 lb 9.6 oz (54.7 kg)     Objective:  Physical Exam Vitals reviewed.  Constitutional:      General: He is not in acute distress.    Appearance: Normal appearance.  Cardiovascular:     Pulses: Normal pulses.     Heart sounds: Normal heart sounds. No murmur heard. Pulmonary:     Effort: Pulmonary effort is normal.     Breath sounds: Normal breath sounds. No wheezing.  Abdominal:     Palpations: Abdomen is soft.  Musculoskeletal:        General: No swelling or tenderness. Normal range of motion.     Comments: In a wheelchair  Skin:    General: Skin is warm and dry.     Capillary Refill: Capillary refill takes less than 2 seconds.  Neurological:     General: No focal deficit present.     Mental Status: He is alert and oriented to person, place, and time. Mental status is at baseline.     Motor: No weakness.  Psychiatric:        Mood and Affect: Mood normal.        Behavior: Behavior normal.        Thought Content: Thought content normal.        Judgment: Judgment normal.         Assessment And Plan:  Fall, subsequent encounter Assessment & Plan: Occurred when he tripped over the wheel of his walker. Advised to call out for help prior to walking    Closed fracture of inferior pubic ramus, unspecified laterality, with routine healing, subsequent encounter Assessment & Plan: Has seen orthopedics and is to start PT however patient is not ready yet  Orders: -     oxyCODONE-Acetaminophen; Take 1 tablet by mouth  every 6 (six) hours as needed for severe pain (pain score 7-10).  Dispense: 30 tablet; Refill: 0  Stage 4 chronic kidney disease (HCC) Assessment & Plan: Has appt with Nephrology next week,  his kidney functions declined significantly at his last office visit. Will repeat levels. Encouraged to stay well hydrated with water and to avoid foods that may increase levels. Sister reports he does not eat much when he does   Orders: -     BMP8+eGFR  Tetanus, diphtheria, and acellular pertussis (Tdap) vaccination declined  COVID-19 vaccination declined Assessment & Plan: Declines covid 19 vaccine. Discussed risk of covid 81 and if he changes her mind about the vaccine to call the office. Education has been provided regarding the importance of this vaccine but patient still declined. Advised may receive this vaccine at local pharmacy or Health Dept.or vaccine clinic. Aware to provide a copy of the vaccination record if obtained from local pharmacy or Health Dept.  Encouraged to take multivitamin, vitamin d, vitamin c and zinc to increase immune system. Aware can call office if would like to have vaccine here at office. Verbalized acceptance and understanding.     Return for has appt in January.   Patient was given opportunity to ask questions. Patient verbalized understanding of the plan and was able to repeat key elements of the plan. All questions were answered to their satisfaction.    Jeanell Sparrow, FNP, have reviewed all documentation for this visit. The documentation on 07/09/23 for the exam, diagnosis, procedures, and orders are all accurate and complete.   IF YOU HAVE BEEN REFERRED TO A SPECIALIST, IT MAY TAKE 1-2 WEEKS TO SCHEDULE/PROCESS THE REFERRAL. IF YOU HAVE NOT HEARD FROM US/SPECIALIST IN TWO WEEKS, PLEASE GIVE [place] A CALL AT [phone].

## 2023-07-05 NOTE — Assessment & Plan Note (Signed)
Occurred when he tripped over the wheel of his walker. Advised to call out for help prior to walking

## 2023-07-05 NOTE — Progress Notes (Addendum)
This patient returns to my office for at risk foot care.  This patient requires this care by a professional since this patient will be at risk due to having chronic kidney disease and coagulation defect.  Patient is taking eliquis.   This patient is unable to cut nails himself since the patient cannot reach his nails.These nails are painful walking and wearing shoes.  He presents to the office with his sister.  This patient presents for at risk foot care today.  General Appearance  Alert, conversant and in no acute stress.  Vascular  Dorsalis pedis and posterior tibial  pulses are weakly  palpable  bilaterally.  Capillary return is within normal limits  Bilaterally. Cold feet bilaterally. Absent digital hair  B/L.  Neurologic  Senn-Weinstein monofilament wire test within normal limits  bilaterally. Muscle power within normal limits bilaterally.  Nails Thick disfigured discolored nails with subungual debris  from hallux to fifth toes bilaterally. No evidence of bacterial infection or drainage bilaterally.  Orthopedic  No limitations of motion  feet .  No crepitus or effusions noted.  No bony pathology or digital deformities noted.  HAV  B/L.  Skin  normotropic skin with no porokeratosis noted bilaterally.  No signs of infections or ulcers noted.     Onychomycosis  Pain in right toes  Pain in left toes  Consent was obtained for treatment procedures.   Mechanical debridement of nails 1-5  bilaterally performed with a nail nipper.  Filed with dremel without incident.  Cauterized fourth toe left foot.  Use dremel tool only.   Return office visit     10  weeks                Told patient to return for periodic foot care and evaluation due to potential at risk complications.   Helane Gunther DPM

## 2023-07-05 NOTE — Assessment & Plan Note (Signed)

## 2023-07-06 LAB — BMP8+EGFR
BUN/Creatinine Ratio: 15 (ref 10–24)
BUN: 42 mg/dL — ABNORMAL HIGH (ref 8–27)
CO2: 19 mmol/L — ABNORMAL LOW (ref 20–29)
Calcium: 8.3 mg/dL — ABNORMAL LOW (ref 8.6–10.2)
Chloride: 103 mmol/L (ref 96–106)
Creatinine, Ser: 2.76 mg/dL — ABNORMAL HIGH (ref 0.76–1.27)
Glucose: 102 mg/dL — ABNORMAL HIGH (ref 70–99)
Potassium: 4.9 mmol/L (ref 3.5–5.2)
Sodium: 133 mmol/L — ABNORMAL LOW (ref 134–144)
eGFR: 22 mL/min/{1.73_m2} — ABNORMAL LOW (ref >59)

## 2023-07-07 ENCOUNTER — Ambulatory Visit: Payer: Self-pay

## 2023-07-07 NOTE — Patient Instructions (Signed)
Visit Information  Thank you for taking time to visit with me today. Please don't hesitate to contact me if I can be of assistance to you.   Following are the goals we discussed today:   Goals Addressed             This Visit's Progress    To increase water intake and start [name]   On track    Care Coordination Interventions: Assessed the sister [name] understanding of chronic kidney disease    Evaluation of current treatment plan related to chronic kidney disease self management and patient's adherence to plan as established by provider      Reviewed prescribed diet increase water intake to 48 oz daily  Provided education on kidney disease progression    Reviewed and discussed PCP referral to Nephrology, patient scheduled for initial visit on 07/13/23 Patient will increase his daily water intake Patient will keep his initial visit with [facility] scheduled for 07/13/23 Patient will continue to work with nurse care coordinator for chronic disease management and care coordination needs Last practice recorded BP readings:  BP Readings from Last 3 Encounters:  07/05/23 100/68  06/24/23 127/72  04/27/23 120/80   Most recent eGFR/CrCl:  Lab Results  Component Value Date   EGFR 22 (L) 07/05/2023    No components found for: "CRCL"      To reduce number of falls   On track    Care Coordination Interventions: Completed successful outbound call with sister [name]  Assessed for falls since last encounter Advised patient of importance of notifying provider of falls Assessed patients knowledge of fall risk prevention secondary to previously provided education Determined patient completed post ED follow up with PCP provider Review of patient status, including review of consultant's reports, relevant laboratory and other test results, and medications completed Placed outbound joint call to [facility] with sister [name], determined patient received a standard walker in April  2024 Discussed patient is currently using a rollator loaned by sister [name] Discussed orthopedics referred patient to outpatient PT, he is not ready to start this therapy but may consider in near future Reviewed and discussed current pain regimen and patient is tolerating w/o SE and getting some effectiveness Patient will continue use his walker for ambulation and use fall precautions Patient/sister will notify patient's PCP of any/all falls Patient and sister Roderic Scarce will continue to work with nurse care coordinator for chronic disease management and care coordination needs         Our next appointment is by telephone on 08/05/23 at 1:00 PM  Please call the care guide team at [phone] if you need to cancel or reschedule your appointment.   If you are experiencing a Mental Health or Behavioral Health Crisis or need someone to talk to, please call 1-800-273-TALK (toll free, 24 hour hotline)  The patient verbalized understanding of instructions, educational materials, and care plan provided today and DECLINED offer to receive copy of patient instructions, educational materials, and care plan.   Delsa Sale RN BSN CCM [place]  [facility], [facility] Nurse Care Coordinator  Direct Dial: [phone] Website: [email or web address]

## 2023-07-07 NOTE — Patient Outreach (Signed)
Care Coordination   Follow Up Visit Note   07/07/2023 Name: Trevor Murphy MRN: [id number] DOB: [birth year]  Trevor Murphy is a 88 y.o. year old male who sees [name], FNP for primary care. I spoke with  Trevor Murphy by phone today.  What matters to the patients Murphy and wellness today?  Patient will use fall precautions as directed. He will f/u with a kidney specialist as directed.     Goals Addressed             This Visit's Progress    To increase water intake and start [name]   On track    Care Coordination Interventions: Assessed the [name] understanding of chronic kidney disease    Evaluation of current treatment plan related to chronic kidney disease self management and patient's adherence to plan as established by provider      Reviewed prescribed diet increase water intake to 48 oz daily  Provided education on kidney disease progression    Reviewed and discussed PCP referral to Nephrology, patient scheduled for initial visit on 07/13/23 Patient will increase his daily water intake Patient will keep his initial visit with [facility] scheduled for 07/13/23 Patient will continue to work with nurse care coordinator for chronic disease management and care coordination needs Last practice recorded BP readings:  BP Readings from Last 3 Encounters:  07/05/23 100/68  06/24/23 127/72  04/27/23 120/80   Most recent eGFR/CrCl:  Lab Results  Component Value Date   EGFR 22 (L) 07/05/2023    No components found for: "CRCL"      To reduce number of falls   On track    Care Coordination Interventions: Completed successful outbound call with [name]  Assessed for falls since last encounter Advised patient of importance of notifying provider of falls Assessed patients knowledge of fall risk prevention secondary to previously provided education Determined patient completed post ED follow up with PCP provider Review of patient status, including review of  consultant's reports, relevant laboratory and other test results, and medications completed Placed outbound joint call to Trevor Murphy with [name], determined patient received a standard walker in April 2024 Discussed patient is currently using a rollator loaned by [name] Discussed orthopedics referred patient to outpatient PT, he is not ready to start this therapy but may consider in near future Reviewed and discussed current pain regimen and patient is tolerating w/o SE and getting some effectiveness Patient will continue use his walker for ambulation and use fall precautions Patient/sister will notify patient's PCP of any/all falls Patient and [name] will continue to work with nurse care coordinator for chronic disease management and care coordination needs     Interventions Today    Flowsheet Row Most Recent Value  Chronic Disease   Chronic disease during today's visit Other  [impaired physical mobility,  s/p right pubic fracture]  General Interventions   General Interventions Discussed/Reviewed General Interventions Discussed, General Interventions Reviewed, Durable Medical Equipment (DME), Doctor Visits, Communication with  Doctor Visits Discussed/Reviewed Doctor Visits Discussed, Doctor Visits Reviewed, PCP  Durable Medical Equipment (DME) [name]  Communication with --  [Trevor Murphy]  Exercise Interventions   Exercise Discussed/Reviewed Physical Activity  Physical Activity Discussed/Reviewed Physical Activity Discussed, Physical Activity Reviewed, Types of exercise  Education Interventions   Education Provided Provided Education  Provided Verbal Education On When to see the doctor, Exercise  Safety Interventions   Safety Discussed/Reviewed Home Safety, Fall Risk, Safety Reviewed, Safety Discussed  Home Safety Assistive Devices, Contact provider for referral to PT/OT          SDOH assessments and interventions completed:  Yes  SDOH Interventions Today     Flowsheet Row Most Recent Value  SDOH Interventions   Food Insecurity Interventions Intervention Not Indicated  Housing Interventions Intervention Not Indicated  Transportation Interventions Intervention Not Indicated  Utilities Interventions Intervention Not Indicated  Physical Activity Interventions Other (Comments)  [patient w/acute pubic fracture,  using Rollator and W/C for ambulation/mobility]        Care Coordination Interventions:  Yes, provided   Follow up plan: Follow up call scheduled for 08/05/23 @ 1:00 PM    Encounter Outcome:  Patient Visit Completed

## 2023-07-09 ENCOUNTER — Encounter: Payer: Self-pay | Admitting: Nurse Practitioner

## 2023-07-13 DIAGNOSIS — N1832 Chronic kidney disease, stage 3b: Secondary | ICD-10-CM | POA: Diagnosis not present

## 2023-07-13 DIAGNOSIS — E559 Vitamin D deficiency, unspecified: Secondary | ICD-10-CM | POA: Diagnosis not present

## 2023-07-13 DIAGNOSIS — I509 Heart failure, unspecified: Secondary | ICD-10-CM | POA: Diagnosis not present

## 2023-07-13 DIAGNOSIS — R188 Other ascites: Secondary | ICD-10-CM | POA: Diagnosis not present

## 2023-07-13 DIAGNOSIS — K7460 Unspecified cirrhosis of liver: Secondary | ICD-10-CM | POA: Diagnosis not present

## 2023-07-13 DIAGNOSIS — I129 Hypertensive chronic kidney disease with stage 1 through stage 4 chronic kidney disease, or unspecified chronic kidney disease: Secondary | ICD-10-CM | POA: Diagnosis not present

## 2023-07-14 LAB — LAB REPORT - SCANNED
Albumin, Urine POC: 45.5
Albumin/Creatinine Ratio, Urine, POC: 52
Creatinine, POC: 87.8 mg/dL
EGFR: 30

## 2023-07-16 ENCOUNTER — Ambulatory Visit (INDEPENDENT_AMBULATORY_CARE_PROVIDER_SITE_OTHER): Payer: 59

## 2023-07-16 DIAGNOSIS — I639 Cerebral infarction, unspecified: Secondary | ICD-10-CM | POA: Diagnosis not present

## 2023-07-16 DIAGNOSIS — E875 Hyperkalemia: Secondary | ICD-10-CM | POA: Diagnosis not present

## 2023-07-16 LAB — CUP PACEART REMOTE DEVICE CHECK
Date Time Interrogation Session: 20241205230124
Implantable Pulse Generator Implant Date: 20201014

## 2023-07-19 NOTE — Progress Notes (Unsigned)
Carelink Summary Report / Loop Recorder 

## 2023-07-20 NOTE — Progress Notes (Unsigned)
Cardiology Office Note:   Date:  07/21/2023  ID:  Trevor Murphy, DOB Trevor Murphy, MRN [id number] PCP:  [name], FNP  [facility] Providers Cardiologist:  [name], MD Referring MD: [name], FNP  Chief Complaint/Reason for Referral: Cardiology follow-up ASSESSMENT:    1. Tachy-brady syndrome (HCC)   2. Chronic diastolic CHF (congestive heart failure) (HCC)   3. Coronary artery disease involving native coronary artery of native heart without angina pectoris   4. CKD (chronic kidney disease) stage 4, GFR 15-29 ml/min (HCC)   5. Primary hypertension   6. Hyperlipidemia LDL goal <70   7. Aortic atherosclerosis (HCC)   8. Paroxysmal atrial fibrillation (HCC)   9. Bifascicular bundle branch block     PLAN:   In order of problems listed above: Tachy/bradycardia syndrome: Not a candidate for advanced therapies.  Continue to monitor. Chronic diastolic heart failure: Continue Coreg, [name].  Defer ARB.  Looks dry; will change Lasix to as needed. Coronary artery disease: Continue current therapy. CKD stage IV: Continue [name] for renal protection.  Defer ARB. Hypertension: BP well-controlled. Hyperlipidemia: In this particular patient ,I do not think intensive lipid lowering is required. Aortic atherosclerosis: Will defer aspirin and statin therapy. Paroxysmal atrial fibrillation: Not a candidate for anticoagulation due to worsening liver function and thrombocytopenia.  Continue Coreg.   Bifascicular block: Monitor.           Dispo:  Return in about 6 months (around 01/19/2024).      Medication Adjustments/Labs and Tests Ordered: Current medicines are reviewed at length with the patient today.  Concerns regarding medicines are outlined above.  The following changes have been made:     Labs/tests ordered: Orders Placed This Encounter  Procedures   EKG 12-Lead    Medication Changes: Meds ordered this encounter  Medications   furosemide (LASIX) 20 MG tablet     Sig: Take 1 tablet (20 mg total) by mouth as needed.    Current medicines are reviewed at length with the patient today.  The patient does not have concerns regarding medicines.  I spent 33 minutes reviewing all clinical data during and prior to this visit including all relevant imaging studies, laboratories, clinical information from other health systems and prior notes from both Cardiology and other specialties, interviewing the patient, conducting a complete physical examination, and coordinating care in order to formulate a comprehensive and personalized evaluation and treatment plan.   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 male with the indicated medical history here for recommendations regarding chronic diastolic heart failure.  The patient was admitted to the hospital in October due to AKI and was treated with IV fluids.  His lorazepam was stopped at that time.  He was seen by his primary care provider in early Trevor and was doing well.  He was started on [name] at that time.   The patient is here with his sister.  On his own admission he does not do much in  a day.  He sometimes gets short of breath but it is not too bothersome to him.  He likes to watch TV most of the day.  He denies any paroxysmal atrial dyspnea, orthopnea, severe falls, or severe bleeding.  He is required no recent emergency  room visits or hospitalizations since discharge after treatment for AKI.  He is otherwise well without significant complaints.  Plan: Continue Marcelline Deist, defer ACE inhibitor or ARB given CKD stage IV.  12/24: The patient was seen for routine follow-up.  Patient was admitted in June with bradycardia and SVT.  Conservative approach was pursued given his multiple comorbidities.  He was seen in follow-up in July and was doing fairly well.  His heart rate was 68.  No changes were made to his medical regimen.  He was seen recently by nephrology and had a spironolactone discontinued due to hyperkalemia.  The patient was recently in the emergency department and hospitalized due to fall and he sustained pelvic fracture.  Apparently he is doing much better from this and ambulating without any issues.  He denies any shortness of breath or chest pain.  He has had no presyncope, syncope, or severe bleeding.  He feels relatively well.         Current Medications: Current Meds  Medication Sig   acetaminophen (TYLENOL) 500 MG tablet Take 1,000 mg by mouth 2 (two) times daily as needed for mild pain or moderate pain (leg pain).   albuterol (VENTOLIN HFA) 108 (90 Base) MCG/ACT inhaler INHALE 1 TO 2 PUFFS INTO THE LUNGS EVERY 6 HOURS AS NEEDED FOR WHEEZING OR SHORTNESS OF BREATH   carvedilol (COREG) 3.125 MG tablet Take 1 tablet (3.125 mg total) by mouth 2 (two) times daily with a meal.   dapagliflozin propanediol ([name]) 10 MG TABS tablet Take 1 tablet (10 mg total) by mouth daily before breakfast.   FEROSUL 325 (65 Fe) MG tablet TAKE 1 TABLET BY MOUTH DAILY   finasteride (PROSCAR) 5 MG tablet Take 5 mg by mouth daily.   LUMIGAN 0.01 % SOLN Place 1 drop into both eyes at bedtime.   SIMBRINZA 1-0.2 % SUSP Follow up with your PCP on when to restart this medication. (Patient taking differently: Place 1 drop into both eyes 3 (three) times daily.)   Vitamin D, Ergocalciferol, (DRISDOL) 1.25 MG (50000 UNIT) CAPS capsule  Take 50,000 Units by mouth once a week.   [DISCONTINUED] furosemide (LASIX) 20 MG tablet Take 1 tablet (20 mg total) by mouth daily.     Review of Systems:   Please see the history of present illness.    All other systems reviewed and are negative.     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  EKG Interpretation Date/Time:  Wednesday July 21 2023 13:46:57 EST Ventricular Rate:  56 PR Interval:  280 QRS Duration:  136 QT Interval:  482 QTC Calculation: 465 R Axis:   -33  Text Interpretation: Sinus bradycardia with sinus arrhythmia with 1st degree A-V block Left axis deviation Right bundle branch block Left ventricular hypertrophy ( R in aVL , Romhilt-Estes ) Cannot rule out Septal infarct , age undetermined Possible Lateral infarct , age undetermined Possible Inferior infarct (cited on or before 31-Jan-2023) When compared with ECG of 31-Jan-2023 09:44, Significant changes have occurred Confirmed by [name] ([id number]) on 07/21/2023 1:54:53 PM         Risk Assessment/Calculations:    CHA2DS2-VASc Score = 6  This indicates a 9.7% annual risk of stroke. The patient's score is based upon: CHF History: 1 HTN History: 1 Diabetes History: 0 Stroke History: 2 Vascular Disease History: 0 Age Score: 2 Gender Score: 0         Physical Exam:   VS:  BP 128/76   Pulse (!) 50   Ht 5\' 8"  (1.727 m)   Wt 123 lb (55.8 kg)   SpO2 99%   BMI 18.70 kg/m        Wt Readings from Last 3 Encounters:  07/21/23 123 lb (55.8 kg)  06/24/23 135 lb (61.2 kg)  04/27/23 133 lb 6.4 oz (60.5 kg)      GENERAL:  No apparent distress, AOx3 HEENT:  No carotid bruits, +2 carotid impulses, no scleral icterus: Dry mucous membranes CAR: RRR no murmurs, gallops, rubs, or thrills RES:  Clear to auscultation bilaterally ABD:  Soft, nontender, nondistended, positive bowel sounds x 4 VASC:  +2 radial pulses, +2 carotid pulses NEURO:  CN 2-12  grossly intact; motor and sensory grossly intact PSYCH:  No active depression or anxiety EXT:  No edema, ecchymosis, or cyanosis  Signed, Orbie Pyo, MD  07/21/2023 2:06 PM    [facility] [address] [place], [place], [place]  [address] Phone: [phone]; Fax: [phone]   Note:  This document was prepared using Dragon voice recognition software and may include unintentional dictation errors.

## 2023-07-21 ENCOUNTER — Encounter: Payer: Self-pay | Admitting: Internal Medicine

## 2023-07-21 ENCOUNTER — Ambulatory Visit: Payer: 59 | Attending: Internal Medicine | Admitting: Internal Medicine

## 2023-07-21 VITALS — BP 128/76 | HR 50 | Ht 68.0 in | Wt 123.0 lb

## 2023-07-21 DIAGNOSIS — I452 Bifascicular block: Secondary | ICD-10-CM | POA: Diagnosis not present

## 2023-07-21 DIAGNOSIS — I480 Paroxysmal atrial fibrillation: Secondary | ICD-10-CM | POA: Diagnosis not present

## 2023-07-21 DIAGNOSIS — I10 Essential (primary) hypertension: Secondary | ICD-10-CM | POA: Diagnosis not present

## 2023-07-21 DIAGNOSIS — I5032 Chronic diastolic (congestive) heart failure: Secondary | ICD-10-CM | POA: Diagnosis not present

## 2023-07-21 DIAGNOSIS — E785 Hyperlipidemia, unspecified: Secondary | ICD-10-CM

## 2023-07-21 DIAGNOSIS — I700 Atherosclerosis of aorta: Secondary | ICD-10-CM | POA: Diagnosis not present

## 2023-07-21 DIAGNOSIS — I495 Sick sinus syndrome: Secondary | ICD-10-CM | POA: Diagnosis not present

## 2023-07-21 DIAGNOSIS — N184 Chronic kidney disease, stage 4 (severe): Secondary | ICD-10-CM

## 2023-07-21 DIAGNOSIS — I2510 Atherosclerotic heart disease of native coronary artery without angina pectoris: Secondary | ICD-10-CM | POA: Diagnosis not present

## 2023-07-21 MED ORDER — FUROSEMIDE 20 MG PO TABS: 20.0000 mg | ORAL_TABLET | ORAL | Status: AC | PRN

## 2023-07-21 NOTE — Patient Instructions (Addendum)
Medication Instructions:  Your physician has recommended you make the following change in your medication:   1) CHANGE furosemide (Lasix) to 20 mg as needed for swelling in legs/feet or shortness of breath, or for weight gain of 3 lbs overnight or 5 lbs in 1 week.  *If you need a refill on your cardiac medications before your next appointment, please call your pharmacy*  Lab Work: None ordered today.  Testing/Procedures: None ordered today.  Follow-Up: At [facility], you and your health needs are our priority.  As part of our continuing mission to provide you with exceptional heart care, we have created designated Provider Care Teams.  These Care Teams include your primary Cardiologist (physician) and Advanced Practice Providers (APPs -  Physician Assistants and Nurse Practitioners) who all work together to provide you with the care you need, when you need it.  We recommend signing up for the patient portal called "MyChart".  Sign up information is provided on this After Visit Summary.  MyChart is used to connect with patients for Virtual Visits (Telemedicine).  Patients are able to view lab/test results, encounter notes, upcoming appointments, etc.  Non-urgent messages can be sent to your provider as well.   To learn more about what you can do with MyChart, go to [email or web address].    Your next appointment:   6 month(s)  The format for your next appointment:   In Person  Provider:   Jari Favre, PA-C, Ronie Spies, PA-C, Tereso Newcomer, PA-C, or Perlie Gold, PA-C

## 2023-07-26 ENCOUNTER — Ambulatory Visit: Payer: Medicaid Other

## 2023-08-02 DIAGNOSIS — E875 Hyperkalemia: Secondary | ICD-10-CM | POA: Diagnosis not present

## 2023-08-05 ENCOUNTER — Ambulatory Visit: Payer: Self-pay

## 2023-08-05 NOTE — Patient Outreach (Signed)
  Care Coordination   Follow Up Visit Note   08/05/2023 Name: Trevor Murphy MRN: [id number] DOB: February 10, 1935  Trevor Murphy is a 88 y.o. year old male who sees [name], FNP for primary care. I spoke with [name] by phone today.  What matters to the patients health and wellness today?  Patient would like to remain healthy and living in his own home.     Goals Addressed             This Visit's Progress    To reduce number of falls   On track    Care Coordination Interventions: Completed successful outbound call with [name]  Assessed for falls since last encounter Advised patient of importance of notifying provider of falls Assessed patients knowledge of fall risk prevention secondary to previously provided education Determined patient remains to be stable with no new falls reported since last nurse contact Reviewed scheduled/upcoming provider appointments including: next PCP follow up appointment scheduled for 08/31/23 @11 :20 AM Discussed plans with patient for ongoing care coordination follow up and provided patient with direct contact information for nurse care coordinator    Interventions Today    Flowsheet Row Most Recent Value  Chronic Disease   Chronic disease during today's visit Congestive Heart Failure (CHF), Other  [s/p falls]  General Interventions   General Interventions Discussed/Reviewed General Interventions Discussed, General Interventions Reviewed, Doctor Visits  Doctor Visits Discussed/Reviewed Doctor Visits Discussed, Doctor Visits Reviewed, Specialist, PCP  Education Interventions   Education Provided Provided Education  Provided Verbal Education On When to see the doctor, Medication  Pharmacy Interventions   Pharmacy Dicussed/Reviewed Pharmacy Topics Reviewed, Pharmacy Topics Discussed, Medications and their functions  Safety Interventions   Safety Discussed/Reviewed Home Safety  Home Safety Assistive Devices          SDOH  assessments and interventions completed:  No     Care Coordination Interventions:  Yes, provided   Follow up plan: Follow up call scheduled for 09/02/23 @10 :30 AM    Encounter Outcome:  Patient Visit Completed

## 2023-08-05 NOTE — Patient Instructions (Signed)
Visit Information  Thank you for taking time to visit with me today. Please don't hesitate to contact me if I can be of assistance to you.   Following are the goals we discussed today:   Goals Addressed             This Visit's Progress    To reduce number of falls   On track    Care Coordination Interventions: Completed successful outbound call with sister [name]  Assessed for falls since last encounter Advised patient of importance of notifying provider of falls Assessed patients knowledge of fall risk prevention secondary to previously provided education Determined patient remains to be stable with no new falls reported since last nurse contact Reviewed scheduled/upcoming provider appointments including: next PCP follow up appointment scheduled for 08/31/23 @11 :20 AM Discussed plans with patient for ongoing care coordination follow up and provided patient with direct contact information for nurse care coordinator        Our next appointment is by telephone on 09/02/23 at 10:30 AM  Please call the care guide team at [phone] if you need to cancel or reschedule your appointment.   If you are experiencing a Mental Health or Behavioral Health Crisis or need someone to talk to, please call 1-800-273-TALK (toll free, 24 hour hotline)  The patient verbalized understanding of instructions, educational materials, and care plan provided today and DECLINED offer to receive copy of patient instructions, educational materials, and care plan.   Delsa Sale RN BSN CCM [place]  [facility], [facility] Nurse Care Coordinator  Direct Dial: [phone] Website: [email or web address]

## 2023-08-20 ENCOUNTER — Ambulatory Visit: Payer: 59

## 2023-08-20 DIAGNOSIS — I495 Sick sinus syndrome: Secondary | ICD-10-CM | POA: Diagnosis not present

## 2023-08-20 LAB — CUP PACEART REMOTE DEVICE CHECK
Date Time Interrogation Session: 20250109230130
Implantable Pulse Generator Implant Date: 20201014

## 2023-08-31 ENCOUNTER — Ambulatory Visit: Payer: 59 | Admitting: Nurse Practitioner

## 2023-08-31 ENCOUNTER — Encounter: Payer: Self-pay | Admitting: Nurse Practitioner

## 2023-08-31 VITALS — BP 124/72 | HR 57 | Temp 98.7°F | Ht 68.0 in | Wt 137.8 lb

## 2023-08-31 DIAGNOSIS — I10 Essential (primary) hypertension: Secondary | ICD-10-CM

## 2023-08-31 DIAGNOSIS — I5032 Chronic diastolic (congestive) heart failure: Secondary | ICD-10-CM

## 2023-08-31 DIAGNOSIS — I130 Hypertensive heart and chronic kidney disease with heart failure and stage 1 through stage 4 chronic kidney disease, or unspecified chronic kidney disease: Secondary | ICD-10-CM

## 2023-08-31 DIAGNOSIS — R188 Other ascites: Secondary | ICD-10-CM

## 2023-08-31 DIAGNOSIS — Z2821 Immunization not carried out because of patient refusal: Secondary | ICD-10-CM | POA: Diagnosis not present

## 2023-08-31 DIAGNOSIS — N184 Chronic kidney disease, stage 4 (severe): Secondary | ICD-10-CM

## 2023-08-31 DIAGNOSIS — K7460 Unspecified cirrhosis of liver: Secondary | ICD-10-CM

## 2023-08-31 MED ORDER — CARVEDILOL 3.125 MG PO TABS: 3.1250 mg | ORAL_TABLET | Freq: Two times a day (BID) | ORAL | 2 refills | Status: DC

## 2023-08-31 NOTE — Progress Notes (Signed)
Madelaine Bhat, CMA,acting as a Neurosurgeon for [name], FNP.,have documented all relevant documentation on the behalf of [name], FNP,as directed by  [name], FNP while in the presence of [name], FNP.  Subjective:  Patient ID: Trevor Murphy , male    DOB: May 17, 1935 , 89 y.o.   MRN: [id number]  Chief Complaint  Patient presents with   Hypertension    HPI  Patient presents today for a bp follow up, Patient reports compliance with medication. Patient denies any chest pain, SOB, or headaches. Patient has no concerns today.      Past Medical History:  Diagnosis Date   Arthritis    CAD (coronary artery disease)    a. Cath 08/2009: minimal nonobstructive disease (25% LAD). b. Lexiscan nuc 6/215: EF 54%, no ischemia or infarction.   Chronic diastolic CHF (congestive heart failure) (HCC)    a. Echo 2013: EF 55-60%, mild AI, mild MR. b. Echo 5/15 with EF 60-65%, no WMA, mild LVH, mild AI.   CKD (chronic kidney disease) stage 3, GFR 30-59 ml/min (HCC) 07/26/2012   a. Felt likely due to HTN.   CVA (cerebral infarction) 07/28/2012   Glaucoma    Gout    H/O umbilical hernia repair    Headache(784.0)    " WHEN MY BLOOD PRESSURE IS UP"   History of acute renal failure    History of alcohol abuse    HTN (hypertension)    Hypertension    a. Patent renal arteries 2005.   Noncompliance    OA (osteoarthritis)    Other and unspecified hyperlipidemia    Poor circulation    Sinus bradycardia    SVT (supraventricular tachycardia) (HCC)    a. Possible AVNRT.  Got adenosine from EMS in 2015 for SVT.  During 5/15 hospitalization had episode of short R-P tachycardia (possible AVNRT) terminated by adenosine.  b. 04/2016 - recurrent tachycardia (SVT vs. AVNRT), again terminated by Adenosine   Visual impairment      Family History  Problem Relation Age of Onset   CVA Mother    Hypertension Brother    Hypertension Sister    Hypertension Brother    Stroke Sister    Heart attack Neg  Hx      Current Outpatient Medications:    acetaminophen (TYLENOL) 500 MG tablet, Take 1,000 mg by mouth 2 (two) times daily as needed for mild pain or moderate pain (leg pain)., Disp: , Rfl:    albuterol (VENTOLIN HFA) 108 (90 Base) MCG/ACT inhaler, INHALE 1 TO 2 PUFFS INTO THE LUNGS EVERY 6 HOURS AS NEEDED FOR WHEEZING OR SHORTNESS OF BREATH, Disp: 6.7 g, Rfl: 0   dapagliflozin propanediol ([name]) 10 MG TABS tablet, Take 1 tablet (10 mg total) by mouth daily before breakfast., Disp: 90 tablet, Rfl: 2   FEROSUL 325 (65 Fe) MG tablet, TAKE 1 TABLET BY MOUTH DAILY, Disp: 90 tablet, Rfl: 1   finasteride (PROSCAR) 5 MG tablet, Take 5 mg by mouth daily., Disp: , Rfl:    furosemide (LASIX) 20 MG tablet, Take 1 tablet (20 mg total) by mouth as needed., Disp: , Rfl:    LUMIGAN 0.01 % SOLN, Place 1 drop into both eyes at bedtime., Disp: , Rfl:    SIMBRINZA 1-0.2 % SUSP, Follow up with your PCP on when to restart this medication. (Patient taking differently: Place 1 drop into both eyes 3 (three) times daily.), Disp: , Rfl:    Vitamin D, Ergocalciferol, (DRISDOL) 1.25 MG (50000  UNIT) CAPS capsule, Take 50,000 Units by mouth once a week., Disp: , Rfl:    carvedilol (COREG) 3.125 MG tablet, Take 1 tablet (3.125 mg total) by mouth 2 (two) times daily with a meal., Disp: 180 tablet, Rfl: 2   No Known Allergies   Review of Systems  Constitutional: Negative.   HENT: Negative.    Eyes: Negative.   Respiratory: Negative.    Cardiovascular: Negative.   Gastrointestinal: Negative.   Musculoskeletal:  Positive for gait problem.     Today's Vitals   08/31/23 1145  BP: 124/72  Pulse: (!) 57  Temp: 98.7 F (37.1 C)  TempSrc: Oral  Weight: 137 lb 12.8 oz (62.5 kg)  Height: 5\' 8"  (1.727 m)  PainSc: 0-No pain   Body mass index is 20.95 kg/m.  Wt Readings from Last 3 Encounters:  08/31/23 137 lb 12.8 oz (62.5 kg)  07/21/23 123 lb (55.8 kg)  06/24/23 135 lb (61.2 kg)    Objective:  Physical  Exam Vitals reviewed.  Constitutional:      General: He is not in acute distress.    Appearance: Normal appearance.  Cardiovascular:     Pulses: Normal pulses.     Heart sounds: Normal heart sounds. No murmur heard. Pulmonary:     Effort: Pulmonary effort is normal.     Breath sounds: Normal breath sounds. No wheezing.  Musculoskeletal:        General: No swelling or tenderness. Normal range of motion.     Comments: In a wheelchair  Skin:    General: Skin is warm and dry.     Capillary Refill: Capillary refill takes less than 2 seconds.  Neurological:     General: No focal deficit present.     Mental Status: He is alert and oriented to person, place, and time. Mental status is at baseline.     Motor: No weakness.  Psychiatric:        Mood and Affect: Mood normal.        Behavior: Behavior normal.        Thought Content: Thought content normal.        Judgment: Judgment normal.         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 No current issues, no edema noted   Stage 4 chronic kidney disease (HCC) Assessment & Plan: Continue f/u with Nephrology   Cirrhosis of liver with ascites, unspecified hepatic cirrhosis type (HCC) [K74.60, R18.8] Assessment & Plan: He is to continue f/u with GI for evaluation.    Herpes zoster vaccination declined Assessment & Plan: Declines shingrix, educated on disease process and is aware if he changes his mind to notify office    COVID-19 vaccination declined Assessment & Plan: Declines covid 19 vaccine. Discussed risk of covid 52 and if he changes her mind about the vaccine to call the office. Education has been provided regarding the importance of this vaccine  but patient still declined. Advised may receive this vaccine at local pharmacy or Health Dept.or vaccine clinic. Aware to provide a copy of the vaccination record if obtained from local pharmacy or Health Dept.  Encouraged to take multivitamin, vitamin d, vitamin c and zinc to increase immune system. Aware can call office if would like to have vaccine  here at office. Verbalized acceptance and understanding.    Other orders -     Carvedilol; Take 1 tablet (3.125 mg total) by mouth 2 (two) times daily with a meal.  Dispense: 180 tablet; Refill: 2    Return for 4 month bp check; AWV do virtual if possible.  Patient was given opportunity to ask questions. Patient verbalized understanding of the plan and was able to repeat key elements of the plan. All questions were answered to their satisfaction.    Jeanell Sparrow, FNP, have reviewed all documentation for this visit. The documentation on 08/31/23 for the exam, diagnosis, procedures, and orders are all accurate and complete.   IF YOU HAVE BEEN REFERRED TO A SPECIALIST, IT MAY TAKE 1-2 WEEKS TO SCHEDULE/PROCESS THE REFERRAL. IF YOU HAVE NOT HEARD FROM US/SPECIALIST IN TWO WEEKS, PLEASE GIVE [place] A CALL AT [phone].

## 2023-09-02 ENCOUNTER — Ambulatory Visit: Payer: Self-pay

## 2023-09-02 NOTE — Patient Instructions (Signed)
Visit Information  Thank you for taking time to visit with me today. Please don't hesitate to contact me if I can be of assistance to you.   Following are the goals we discussed today:   Goals Addressed             This Visit's Progress    To reduce number of falls   On track    Care Coordination Interventions: Completed successful outbound call with sister [name]  Assessed for falls since last encounter Advised patient of importance of notifying provider of falls Assessed patients knowledge of fall risk prevention secondary to previously provided education Determined patient remains to be stable with no new falls reported since last nurse contact Discussed plans with patient for ongoing care coordination follow up and provided patient with direct contact information for nurse care coordinator        Our next appointment is by telephone on 11/01/23 at 10:30 AM  Please call the care guide team at [phone] if you need to cancel or reschedule your appointment.   If you are experiencing a Mental Health or Behavioral Health Crisis or need someone to talk to, please call 1-800-273-TALK (toll free, 24 hour hotline)  The patient verbalized understanding of instructions, educational materials, and care plan provided today and DECLINED offer to receive copy of patient instructions, educational materials, and care plan.   Delsa Sale RN BSN CCM [place]  [facility], [facility] Nurse Care Coordinator  Direct Dial: [phone] Website: [email or web address]

## 2023-09-02 NOTE — Patient Outreach (Signed)
  Care Coordination   Follow Up Visit Note   09/02/2023 Name: Trevor Murphy MRN: [id number] DOB: 10/17/1934  Trevor Murphy is a 89 y.o. year old male who sees [name], FNP for primary care. I spoke with [name] by phone today.  What matters to the patients health and wellness today?  Patient would like to continue to manage his chronic conditions and remain living independently.     Goals Addressed             This Visit's Progress    To reduce number of falls   On track    Care Coordination Interventions: Completed successful outbound call with [name]  Assessed for falls since last encounter Advised patient of importance of notifying provider of falls Assessed patients knowledge of fall risk prevention secondary to previously provided education Determined patient remains to be stable with no new falls reported since last nurse contact Discussed plans with patient for ongoing care coordination follow up and provided patient with direct contact information for nurse care coordinator        SDOH assessments and interventions completed:  No     Care Coordination Interventions:  Yes, provided   Follow up plan: Follow up call scheduled for 11/01/23 @10 :30 AM    Encounter Outcome:  Patient Visit Completed

## 2023-09-08 ENCOUNTER — Encounter: Payer: Self-pay | Admitting: Nurse Practitioner

## 2023-09-08 NOTE — Assessment & Plan Note (Signed)
He is to continue f/u with GI for evaluation.

## 2023-09-08 NOTE — Assessment & Plan Note (Signed)
Continue f/u with Cardiology. No current issues, no edema noted

## 2023-09-08 NOTE — Assessment & Plan Note (Signed)
Continue f/u with Nephrology

## 2023-09-08 NOTE — Assessment & Plan Note (Signed)
Blood pressure is well controlled, continue current medications and f/u with Cardiology

## 2023-09-08 NOTE — Assessment & Plan Note (Signed)
Declines shingrix, educated on disease process and is aware if he changes his mind to notify office

## 2023-09-08 NOTE — Assessment & Plan Note (Signed)
Declines covid 19 vaccine. Discussed risk of covid 64 and if he changes her mind about the vaccine to call the office. Education has been provided regarding the importance of this vaccine but patient still declined. Advised may receive this vaccine at local pharmacy or Health Dept.or vaccine clinic. Aware to provide a copy of the vaccination record if obtained from local pharmacy or Health Dept.  Encouraged to take multivitamin, vitamin d, vitamin c and zinc to increase immune system. Aware can call office if would like to have vaccine here at office. Verbalized acceptance and understanding.

## 2023-09-24 ENCOUNTER — Ambulatory Visit (INDEPENDENT_AMBULATORY_CARE_PROVIDER_SITE_OTHER): Payer: 59

## 2023-09-24 DIAGNOSIS — I495 Sick sinus syndrome: Secondary | ICD-10-CM | POA: Diagnosis not present

## 2023-09-24 NOTE — Progress Notes (Signed)
Carelink Summary Report / Loop Recorder

## 2023-09-25 LAB — CUP PACEART REMOTE DEVICE CHECK
Date Time Interrogation Session: 20250213230027
Implantable Pulse Generator Implant Date: 20201014

## 2023-10-06 ENCOUNTER — Ambulatory Visit (INDEPENDENT_AMBULATORY_CARE_PROVIDER_SITE_OTHER): Payer: 59 | Admitting: Podiatry

## 2023-10-06 ENCOUNTER — Encounter: Payer: Self-pay | Admitting: Podiatry

## 2023-10-06 DIAGNOSIS — B351 Tinea unguium: Secondary | ICD-10-CM | POA: Diagnosis not present

## 2023-10-06 DIAGNOSIS — M79675 Pain in left toe(s): Secondary | ICD-10-CM | POA: Diagnosis not present

## 2023-10-06 DIAGNOSIS — M79674 Pain in right toe(s): Secondary | ICD-10-CM

## 2023-10-06 DIAGNOSIS — N183 Chronic kidney disease, stage 3 unspecified: Secondary | ICD-10-CM

## 2023-10-06 DIAGNOSIS — I739 Peripheral vascular disease, unspecified: Secondary | ICD-10-CM | POA: Diagnosis not present

## 2023-10-06 NOTE — Progress Notes (Signed)
 This patient returns to my office for at risk foot care.  This patient requires this care by a professional since this patient will be at risk due to having chronic kidney disease and coagulation defect.  Patient is taking eliquis.   This patient is unable to cut nails himself since the patient cannot reach his nails.These nails are painful walking and wearing shoes.  He presents to the office with his sister.  This patient presents for at risk foot care today.  General Appearance  Alert, conversant and in no acute stress.  Vascular  Dorsalis pedis and posterior tibial  pulses are weakly  palpable  bilaterally.  Capillary return is within normal limits  Bilaterally. Cold feet bilaterally. Absent digital hair  B/L.  Neurologic  Senn-Weinstein monofilament wire test within normal limits  bilaterally. Muscle power within normal limits bilaterally.  Nails Thick disfigured discolored nails with subungual debris  from hallux to fifth toes bilaterally. No evidence of bacterial infection or drainage bilaterally.  Orthopedic  No limitations of motion  feet .  No crepitus or effusions noted.  No bony pathology or digital deformities noted.  HAV  B/L.  Skin  normotropic skin with no porokeratosis noted bilaterally.  No signs of infections or ulcers noted.     Onychomycosis  Pain in right toes  Pain in left toes  Consent was obtained for treatment procedures.   Mechanical debridement of nails 1-5  bilaterally performed with a nail nipper.  Filed with dremel without incident.  Cauterized fourth toe left foot.  Use dremel tool only.   Return office visit     10  weeks                Told patient to return for periodic foot care and evaluation due to potential at risk complications.   Helane Gunther DPM

## 2023-10-15 ENCOUNTER — Other Ambulatory Visit: Payer: Self-pay | Admitting: Nurse Practitioner

## 2023-10-15 DIAGNOSIS — D509 Iron deficiency anemia, unspecified: Secondary | ICD-10-CM

## 2023-10-26 NOTE — Progress Notes (Signed)
 Carelink Summary Report / Loop Recorder

## 2023-10-26 NOTE — Addendum Note (Signed)
 Addended by: Elease Etienne A on: 10/26/2023 10:03 AM   Modules accepted: Orders

## 2023-10-29 ENCOUNTER — Ambulatory Visit: Payer: Medicaid Other

## 2023-10-29 DIAGNOSIS — I495 Sick sinus syndrome: Secondary | ICD-10-CM | POA: Diagnosis not present

## 2023-10-29 LAB — CUP PACEART REMOTE DEVICE CHECK
Date Time Interrogation Session: 20250320230254
Implantable Pulse Generator Implant Date: 20201014

## 2023-11-01 ENCOUNTER — Ambulatory Visit: Payer: Self-pay

## 2023-11-01 NOTE — Patient Outreach (Addendum)
 Care Coordination   Follow Up Visit Note   11/01/2023 Name: Trevor Murphy MRN: [id number] DOB: 1934/09/10  Trevor Murphy is a 89 y.o. year old male who sees [name], FNP for primary care. I spoke with sister Baxter Flattery by phone today.  What matters to the patients health and wellness today?  Sister Sebastian Ache would like to see patient increase his caloric intake.     Goals Addressed               This Visit's Progress     Patient Stated     To start daily weights (pt-stated)   On track     Care Coordination Interventions: Assessed need for readable accurate scales in home Provided education about placing scale on hard, flat surface Advised patient to weigh each morning after emptying bladder Discussed importance of daily weight and advised patient to weigh and record daily Discussed plans with patient for ongoing care coordination follow up and provided patient with direct contact information for nurse care coordinator Body mass index is 20.95 kg/m.     Wt Readings from Last 3 Encounters:  08/31/23 137 lb 12.8 oz (62.5 kg)  07/21/23 123 lb (55.8 kg)  06/24/23 135 lb (61.2 kg)       Other     COMPLETED: RN Care Coordination Activities: further follow up needed        Care Coordination Interventions: Completed successful outbound call with sister [facility]  Evaluation of current treatment plan related to Incarcerated left inguinal hernia and patient's adherence to plan as established by provider Determined sister Roderic Scarce received an email from [employer] advising the DME request has been denied, she is unsure why but states she was told the PCP needs to update the medical diagnosis Collaborated with PCP who advised the following diagnosis was used for the purpose of a transport chair, "gait disturbance, age related debility and frequent falls" Discussed with PCP, patient/sister should contact [employer] to determined why this diagnosis is not acceptable for this DME Placed  outbound call to sister Roderic Scarce to advise and she will f/u with the health plan, she will contact this RN if further assistance is needed      To increase water intake and start [name]   On track     Care Coordination Interventions: Assessed the sister [name] understanding of chronic kidney disease    Evaluation of current treatment plan related to chronic kidney disease self management and patient's adherence to plan as established by provider      Reviewed prescribed diet increase water intake to 48 oz daily  Provided education on kidney disease progression    Discussed with sister Roderic Scarce, patient's renal function improved per his Nephrologist, she does not recall what the GFR Reviewed and discussed patient's next scheduled renal follow up with [facility] is scheduled for mid April Last practice recorded BP readings:  BP Readings from Last 3 Encounters:  08/31/23 124/72  07/21/23 128/76  07/05/23 100/68   Most recent eGFR/CrCl:  Lab Results  Component Value Date   EGFR 30.0 07/14/2023    No components found for: "CRCL"       To reduce number of falls   On track     Care Coordination Interventions: Completed successful outbound call with sister [name]  Assessed for falls since last encounter Advised patient of importance of notifying provider of falls Assessed patients knowledge of fall risk prevention secondary to previously provided education Determined patient remains to be  stable with no new falls reported since last nurse contact Discussed plans with patient for ongoing care coordination follow up and provided patient with direct contact information for nurse care coordinator    Interventions Today    Flowsheet Row Most Recent Value  Chronic Disease   Chronic disease during today's visit Chronic Kidney Disease/End Stage Renal Disease (ESRD), Congestive Heart Failure (CHF)  General Interventions   General Interventions Discussed/Reviewed General Interventions Reviewed,  General Interventions Discussed, Doctor Visits, Labs, Durable Medical Equipment (DME)  Doctor Visits Discussed/Reviewed Doctor Visits Discussed, Doctor Visits Reviewed, PCP, Specialist  Durable Medical Equipment (DME) Other  [weight scale]  Exercise Interventions   Exercise Discussed/Reviewed Exercise Discussed, Exercise Reviewed, Physical Activity, Assistive device use and maintanence  Physical Activity Discussed/Reviewed Physical Activity Discussed, Physical Activity Reviewed  Education Interventions   Education Provided Provided Education  Provided Verbal Education On Nutrition, Labs, Medication, Exercise, When to see the doctor  Labs Reviewed Kidney Function  Nutrition Interventions   Nutrition Discussed/Reviewed Nutrition Discussed, Nutrition Reviewed, Fluid intake  Pharmacy Interventions   Pharmacy Dicussed/Reviewed Pharmacy Topics Reviewed, Pharmacy Topics Discussed, Medications and their functions  Safety Interventions   Safety Discussed/Reviewed Home Safety, Safety Reviewed, Safety Discussed, Fall Risk  Home Safety Assistive Devices          SDOH assessments and interventions completed:  Yes  SDOH Interventions Today    Flowsheet Row Most Recent Value  SDOH Interventions   Food Insecurity Interventions Intervention Not Indicated  Housing Interventions Intervention Not Indicated  Utilities Interventions Intervention Not Indicated  Physical Activity Interventions Intervention Not Indicated        Care Coordination Interventions:  Yes, provided   Follow up plan: Follow up call scheduled for 01/06/24 @11 :00 AM    Encounter Outcome:  Patient Visit Completed

## 2023-11-01 NOTE — Patient Instructions (Signed)
 Visit Information  Thank you for taking time to visit with me today. Please don't hesitate to contact me if I can be of assistance to you.   Following are the goals we discussed today:   Goals Addressed               This Visit's Progress     Patient Stated     To start daily weights (pt-stated)   On track     Care Coordination Interventions: Assessed need for readable accurate scales in home Provided education about placing scale on hard, flat surface Advised patient to weigh each morning after emptying bladder Discussed importance of daily weight and advised patient to weigh and record daily Discussed plans with patient for ongoing care coordination follow up and provided patient with direct contact information for nurse care coordinator Body mass index is 20.95 kg/m.     Wt Readings from Last 3 Encounters:  08/31/23 137 lb 12.8 oz (62.5 kg)  07/21/23 123 lb (55.8 kg)  06/24/23 135 lb (61.2 kg)        Other     COMPLETED: RN Care Coordination Activities: further follow up needed        Care Coordination Interventions: Completed successful outbound call with sister [facility]  Evaluation of current treatment plan related to Incarcerated left inguinal hernia and patient's adherence to plan as established by provider Determined sister Roderic Scarce received an email from [employer] advising the DME request has been denied, she is unsure why but states she was told the PCP needs to update the medical diagnosis Collaborated with PCP who advised the following diagnosis was used for the purpose of a transport chair, "gait disturbance, age related debility and frequent falls" Discussed with PCP, patient/sister should contact [employer] to determined why this diagnosis is not acceptable for this DME Placed outbound call to sister Roderic Scarce to advise and she will f/u with the health plan, she will contact this RN if further assistance is needed      To increase water intake and start [name]    On track     Care Coordination Interventions: Assessed the sister [name] understanding of chronic kidney disease    Evaluation of current treatment plan related to chronic kidney disease self management and patient's adherence to plan as established by provider      Reviewed prescribed diet increase water intake to 48 oz daily  Provided education on kidney disease progression    Discussed with sister Roderic Scarce, patient's renal function improved per his Nephrologist, she does not recall what the GFR Reviewed and discussed patient's next scheduled renal follow up with [facility] is scheduled for mid April Last practice recorded BP readings:  BP Readings from Last 3 Encounters:  08/31/23 124/72  07/21/23 128/76  07/05/23 100/68   Most recent eGFR/CrCl:  Lab Results  Component Value Date   EGFR 30.0 07/14/2023    No components found for: "CRCL"       To reduce number of falls   On track     Care Coordination Interventions: Completed successful outbound call with sister [name]  Assessed for falls since last encounter Advised patient of importance of notifying provider of falls Assessed patients knowledge of fall risk prevention secondary to previously provided education Determined patient remains to be stable with no new falls reported since last nurse contact Discussed plans with patient for ongoing care coordination follow up and provided patient with direct contact information for nurse care coordinator  Our next appointment is by telephone on 01/06/24 at 11:00 AM  Please call the care guide team at [phone] if you need to cancel or reschedule your appointment.   If you are experiencing a Mental Health or Behavioral Health Crisis or need someone to talk to, please call 1-800-273-TALK (toll free, 24 hour hotline)  The patient verbalized understanding of instructions, educational materials, and care plan provided today and DECLINED offer to receive copy of patient  instructions, educational materials, and care plan.   Delsa Sale RN BSN CCM [place]  [facility], [facility] Nurse Care Coordinator  Direct Dial: [phone] Website: [email or web address]

## 2023-11-04 ENCOUNTER — Emergency Department (HOSPITAL_COMMUNITY)

## 2023-11-04 ENCOUNTER — Other Ambulatory Visit: Payer: Self-pay

## 2023-11-04 ENCOUNTER — Emergency Department (HOSPITAL_COMMUNITY)
Admission: EM | Admit: 2023-11-04 | Discharge: 2023-11-04 | Disposition: A | Discharge status: Home / Self Care | Attending: Emergency Medicine | Admitting: Emergency Medicine

## 2023-11-04 ENCOUNTER — Encounter (HOSPITAL_COMMUNITY): Payer: Self-pay | Admitting: Emergency Medicine

## 2023-11-04 DIAGNOSIS — H6591 Unspecified nonsuppurative otitis media, right ear: Secondary | ICD-10-CM

## 2023-11-04 DIAGNOSIS — Z8673 Personal history of transient ischemic attack (TIA), and cerebral infarction without residual deficits: Secondary | ICD-10-CM | POA: Diagnosis not present

## 2023-11-04 DIAGNOSIS — N183 Chronic kidney disease, stage 3 unspecified: Secondary | ICD-10-CM | POA: Insufficient documentation

## 2023-11-04 DIAGNOSIS — Z79891 Long term (current) use of opiate analgesic: Secondary | ICD-10-CM | POA: Diagnosis not present

## 2023-11-04 DIAGNOSIS — I5032 Chronic diastolic (congestive) heart failure: Secondary | ICD-10-CM | POA: Diagnosis not present

## 2023-11-04 DIAGNOSIS — Z79899 Other long term (current) drug therapy: Secondary | ICD-10-CM | POA: Insufficient documentation

## 2023-11-04 DIAGNOSIS — R29818 Other symptoms and signs involving the nervous system: Secondary | ICD-10-CM | POA: Diagnosis not present

## 2023-11-04 DIAGNOSIS — R001 Bradycardia, unspecified: Secondary | ICD-10-CM | POA: Insufficient documentation

## 2023-11-04 DIAGNOSIS — I130 Hypertensive heart and chronic kidney disease with heart failure and stage 1 through stage 4 chronic kidney disease, or unspecified chronic kidney disease: Secondary | ICD-10-CM | POA: Diagnosis not present

## 2023-11-04 DIAGNOSIS — R930 Abnormal findings on diagnostic imaging of skull and head, not elsewhere classified: Secondary | ICD-10-CM | POA: Diagnosis not present

## 2023-11-04 DIAGNOSIS — R42 Dizziness and giddiness: Secondary | ICD-10-CM | POA: Insufficient documentation

## 2023-11-04 DIAGNOSIS — I6782 Cerebral ischemia: Secondary | ICD-10-CM | POA: Diagnosis not present

## 2023-11-04 DIAGNOSIS — R55 Syncope and collapse: Secondary | ICD-10-CM | POA: Diagnosis not present

## 2023-11-04 LAB — CBC
HCT: 47.9 % (ref 39.0–52.0)
Hemoglobin: 15.2 g/dL (ref 13.0–17.0)
MCH: 29.8 pg (ref 26.0–34.0)
MCHC: 31.7 g/dL (ref 30.0–36.0)
MCV: 93.9 fL (ref 80.0–100.0)
Platelets: 63 10*3/uL — ABNORMAL LOW (ref 150–400)
RBC: 5.1 MIL/uL (ref 4.22–5.81)
RDW: 15.8 % — ABNORMAL HIGH (ref 11.5–15.5)
WBC: 4.7 10*3/uL (ref 4.0–10.5)
nRBC: 0 % (ref 0.0–0.2)

## 2023-11-04 LAB — URINALYSIS, ROUTINE W REFLEX MICROSCOPIC
Bacteria, UA: NONE SEEN
Bilirubin Urine: NEGATIVE
Glucose, UA: >=500 mg/dL — AB
Ketones, ur: NEGATIVE mg/dL
Leukocytes,Ua: NEGATIVE
Nitrite: NEGATIVE
Protein, ur: 30 mg/dL — AB
Specific Gravity, Urine: 1.016 (ref 1.005–1.030)
pH: 5 (ref 5.0–8.0)

## 2023-11-04 LAB — BASIC METABOLIC PANEL WITH GFR
Anion gap: 7 (ref 5–15)
BUN: 14 mg/dL (ref 8–23)
CO2: 21 mmol/L — ABNORMAL LOW (ref 22–32)
Calcium: 8 mg/dL — ABNORMAL LOW (ref 8.9–10.3)
Chloride: 108 mmol/L (ref 98–111)
Creatinine, Ser: 1.81 mg/dL — ABNORMAL HIGH (ref 0.61–1.24)
GFR, Estimated: 36 mL/min — ABNORMAL LOW (ref >60)
Glucose, Bld: 138 mg/dL — ABNORMAL HIGH (ref 70–99)
Potassium: 4.1 mmol/L (ref 3.5–5.1)
Sodium: 136 mmol/L (ref 135–145)

## 2023-11-04 LAB — CBG MONITORING, ED: Glucose-Capillary: 108 mg/dL — ABNORMAL HIGH (ref 70–99)

## 2023-11-04 MED ORDER — FLUTICASONE PROPIONATE 50 MCG/ACT NA SUSP: 2.0000 | Freq: Every day | NASAL | 0 refills | Status: AC

## 2023-11-04 MED ORDER — MECLIZINE HCL 25 MG PO TABS
25.0000 mg | ORAL_TABLET | Freq: Once | ORAL | Status: AC
  Administered 2023-11-04: 25 mg via ORAL
  Filled 2023-11-04: qty 1

## 2023-11-04 MED ORDER — PROCHLORPERAZINE MALEATE 5 MG PO TABS
5.0000 mg | ORAL_TABLET | Freq: Once | ORAL | Status: AC
  Administered 2023-11-04: 5 mg via ORAL
  Filled 2023-11-04: qty 1

## 2023-11-04 MED ORDER — MECLIZINE HCL 25 MG PO TABS: 25.0000 mg | ORAL_TABLET | Freq: Three times a day (TID) | ORAL | 0 refills | Status: AC | PRN

## 2023-11-04 NOTE — ED Provider Notes (Signed)
 Care assumed from PA Barrie Dunker at shift change, please see his note for full details, but in brief Trevor Murphy is a 89 y.o. male who woke up in the middle of the night with dizziness described as room spinning.  No other neurologic deficits noted on exam, patient has chronic vision loss at baseline.  History of prior strokes.  Minimal improvement with dose of meclizine and CT with evidence of bilateral chronic infarcts but no acute findings.  Plan: Will proceed with MRI.  Physical Exam  BP (!) 170/87   Pulse 60   Temp 97.9 F (36.6 C)   Resp 15   Ht 5\' 8"  (1.727 m)   Wt 59 kg   SpO2 99%   BMI 19.77 kg/m   Physical Exam Vitals and nursing note reviewed.  Constitutional:      General: He is not in acute distress.    Appearance: Normal appearance. He is well-developed. He is not ill-appearing or diaphoretic.  HENT:     Head: Normocephalic and atraumatic.  Eyes:     General:        Right eye: No discharge.        Left eye: No discharge.  Pulmonary:     Effort: Pulmonary effort is normal. No respiratory distress.  Neurological:     Mental Status: He is alert and oriented to person, place, and time.     Coordination: Coordination normal.  Psychiatric:        Mood and Affect: Mood normal.        Behavior: Behavior normal.     Procedures  Procedures  ED Course / MDM    Medical Decision Making Amount and/or Complexity of Data Reviewed Labs: ordered. Radiology: ordered.  Risk Prescription drug management.   MRI shows no acute stroke or intracranial lesion.  There is evidence of middle ear effusion on the right which could be contributing to dizziness.  Discussed results with patient and family member at bedside.  Will treat with meclizine and Epley maneuvers, intranasal steroids to help reduce nasal congestion and middle ear effusion and have patient follow-up with primary care and ENT if dizziness is not resolving.  At this time there does not appear to be any  evidence of an acute emergency medical condition requiring further emergent evaluation and the patient appears stable for discharge with appropriate outpatient follow up. Diagnosis and return precautions discussed with patient who verbalizes understanding and is agreeable to discharge.          Rosezella Rumpf, PA-C 11/04/23 1134    Gerhard Munch, MD 11/04/23 [phone]

## 2023-11-04 NOTE — ED Triage Notes (Addendum)
 Patient reports near syncopal episode today where he felt dizzy and fell back onto bed. Denies LOC-states he was calling for help during fall. Patient states he sometimes feels like this when his blood pressure is high. Compliant with medications. Denies chest pain.

## 2023-11-04 NOTE — Discharge Instructions (Addendum)
 Your MRI did not show any evidence of stroke or tumor.  Dizziness is likely due to fluid in the middle ear.  To help improve this dizziness you can use prescribed meclizine 3 times daily as needed.  You can perform the Epley maneuvers as detailed on your paperwork, they should be done slowly.  To help reduce fluid buildup in your inner ear you can use intranasal steroids (Flonase) once daily in each nostril.   Please follow-up with your primary care doctor and the ear nose and throat specialist if dizziness is not resolving.

## 2023-11-04 NOTE — ED Provider Notes (Signed)
 [place] EMERGENCY DEPARTMENT AT [facility] Provider Note   CSN: [id number] Arrival date & time: 11/04/23  2130     History  Chief Complaint  Patient presents with   Dizziness    Trevor Murphy is a 89 y.o. male.  Patient presents to the emergency room complaining of near syncope with dizziness.  He states he woke up during the night, felt lightheaded/dizzy, and fell back onto his bed.  He denies losing consciousness.  He remembers the fall and remembers the incident and was calling for help when he fell.  He states he occasionally has symptoms like this when his blood pressure is elevated.  He endorses being compliant with medications.  The feeling is described as the room spinning with associated nausea.  Position changes do reportedly trigger the symptoms.  Patient has past medical history significant of chronic diastolic CHF, hypertensive crisis, paroxysmal atrial fibrillation, stage IIIb CKD, CVA, tachybradycardia syndrome.  Patient currently denying shortness of breath, chest pain, abdominal pain.  Patient denies any new weakness.  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   Dizziness      Home Medications Prior to Admission medications   Medication Sig Start Date End Date Taking? Authorizing Provider  acetaminophen (TYLENOL) 500 MG tablet Take 1,000 mg by mouth 2 (two) times daily as needed for mild pain or moderate pain (leg pain).    [provider]  albuterol (VENTOLIN HFA) 108 (90 Base) MCG/ACT inhaler INHALE 1 TO 2 PUFFS INTO THE LUNGS EVERY 6 HOURS AS NEEDED FOR WHEEZING OR SHORTNESS OF BREATH 03/29/23   Ellender Hose, NP  carvedilol (COREG) 3.125 MG tablet Take 1 tablet (3.125 mg total) by mouth 2 (two) times daily with a meal. 08/31/23 11/29/23  [name], FNP  dapagliflozin propanediol ([name]) 10 MG TABS tablet Take 1 tablet (10 mg total) by mouth daily before breakfast.  04/27/23   [name], FNP  FEROSUL 325 (65 Fe) MG tablet TAKE 1 TABLET BY MOUTH DAILY 10/15/23   [name], FNP  finasteride (PROSCAR) 5 MG tablet Take 5 mg by mouth daily. 03/09/22   [provider]  furosemide (LASIX) 20 MG tablet Take 1 tablet (20 mg total) by mouth as needed. Patient not taking: Reported on 11/01/2023 07/21/23   Orbie Pyo, MD  LUMIGAN 0.01 % SOLN Place 1 drop into both eyes at bedtime. 08/28/19   [provider]  SIMBRINZA 1-0.2 % SUSP Follow up with your PCP on when to restart this medication. Patient taking differently: Place 1 drop into both eyes 3 (three) times daily. 06/09/22   Narda Bonds, MD  Vitamin D, Ergocalciferol, (DRISDOL) 1.25 MG (50000 UNIT) CAPS capsule Take 50,000 Units by mouth once a week. 07/16/23   [provider]      Allergies    Patient has no known allergies.    Review of Systems   Review of Systems  Neurological:  Positive for dizziness.    Physical Exam Updated Vital Signs BP (!) 170/87   Pulse 60   Temp 97.9 F (36.6 C)   Resp 15   Ht 5\' 8"  (1.727 m)   Wt 59 kg   SpO2 99%   BMI 19.77 kg/m  Physical Exam Vitals and nursing note reviewed.  HENT:     Head: Normocephalic and atraumatic.  Eyes:     Conjunctiva/sclera: Conjunctivae normal.  Cardiovascular:  Rate and Rhythm: Normal rate and regular rhythm.  Pulmonary:     Effort: Pulmonary effort is normal. No respiratory distress.     Breath sounds: Normal breath sounds.  Abdominal:     Palpations: Abdomen is soft.     Tenderness: There is no abdominal tenderness.  Musculoskeletal:        General: No signs of injury.     Cervical back: Normal range of motion.  Skin:    General: Skin is dry.  Neurological:     Mental Status: He is alert. Mental status is at baseline.     Sensory: No sensory deficit.     Motor: No weakness.  Psychiatric:        Speech: Speech normal.        Behavior: Behavior normal.     ED Results /  Procedures / Treatments   Labs (all labs ordered are listed, but only abnormal results are displayed) Labs Reviewed  BASIC METABOLIC PANEL - Abnormal; Notable for the following components:      Result Value   CO2 21 (*)    Glucose, Bld 138 (*)    Creatinine, Ser 1.81 (*)    Calcium 8.0 (*)    GFR, Estimated 36 (*)    All other components within normal limits  CBC - Abnormal; Notable for the following components:   RDW 15.8 (*)    Platelets 63 (*)    All other components within normal limits  CBG MONITORING, ED - Abnormal; Notable for the following components:   Glucose-Capillary 108 (*)    All other components within normal limits  URINALYSIS, ROUTINE W REFLEX MICROSCOPIC    EKG EKG Interpretation Date/Time:  Thursday November 04 2023 04:42:07 EDT Ventricular Rate:  56 PR Interval:  304 QRS Duration:  130 QT Interval:  504 QTC Calculation: 486 R Axis:   11  Text Interpretation: Sinus bradycardia with 1st degree A-V block with Premature atrial complexes Right bundle branch block Abnormal ECG Confirmed by Tilden Fossa [phone]) on 11/04/2023 5:11:08 AM  Radiology No results found.  Procedures Procedures    Medications Ordered in ED Medications  meclizine (ANTIVERT) tablet 25 mg (25 mg Oral Given 11/04/23 0544)    ED Course/ Medical Decision Making/ A&P                                 Medical Decision Making Amount and/or Complexity of Data Reviewed Labs: ordered. Radiology: ordered.   This patient presents to the ED for concern of dizziness, this involves an extensive number of treatment options, and is a complaint that carries with it a high risk of complications and morbidity.  The differential diagnosis includes BPPV, stroke, orthostatic hypotension, dysrhythmia, infection, others   Co morbidities that complicate the patient evaluation  Previous stroke, tachybradycardia syndrome   Additional history obtained:  Additional history obtained from family at  bedside External records from outside source obtained and reviewed including cardiology notes   Lab Tests:  I Ordered, and personally interpreted labs.  The pertinent results include: Creatinine 1.81 at baseline, unremarkable CBC, unremarkable CBG   Imaging Studies ordered:  I ordered imaging studies including CT head without contrast Images pending   Cardiac Monitoring: / EKG:  The patient was maintained on a cardiac monitor.  I personally viewed and interpreted the cardiac monitored which showed an underlying rhythm of: Sinus bradycardia   Problem List / ED Course / Critical  interventions / Medication management   I ordered medication including meclizine for dizziness Reevaluation of the patient after these medicines showed that the patient stayed the same I have reviewed the patients home medicines and have made adjustments as needed   Social Determinants of Health:  Social Drivers of Health with Concerns   Physical Activity: Insufficiently Active (11/01/2023)   Exercise Vital Sign    Days of Exercise per Week: 3 days    Minutes of Exercise per Session: 20 min  Social Connections: Moderately Isolated (04/15/2021)   Social Connection and Isolation Panel [NHANES]    Frequency of Communication with Friends and Family: More than three times a week    Frequency of Social Gatherings with Friends and Family: More than three times a week    Attends Religious Services: More than 4 times per year    Active Member of [employer] or Organizations: No    Attends Banker Meetings: Never    Marital Status: Widowed  Alcohol Screen: Not on file  Housing: Unknown (11/01/2023)   Housing Stability Vital Sign    Unable to Pay for Housing in the Last Year: No    Number of Times Moved in the Last Year: Not on file    Homeless in the Last Year: No  Health Literacy: Not on file      Test / Admission - Considered:  Patient with no significant orthostatic changes.  Meclizine  providing mild improvement in dizziness but patient still endorses some underlying dizziness.  Head CT pending at this time. Patient care being transferred to Gala Lewandowsky, PA-C at shift handoff. Disposition pending CT head results and reassessment.          Final Clinical Impression(s) / ED Diagnoses Final diagnoses:  Dizziness    Rx / DC Orders ED Discharge Orders     None         Pamala Duffel 11/04/23 0640    Tilden Fossa, MD 11/04/23 [phone]

## 2023-11-04 NOTE — ED Notes (Signed)
 Patient transported to MRI

## 2023-11-08 ENCOUNTER — Telehealth: Payer: Self-pay

## 2023-11-08 NOTE — Transitions of Care (Post Inpatient/ED Visit) (Signed)
 11/08/2023  Name: Trevor Murphy MRN: [id number] DOB: [birth year]  Today's TOC FU Call Status: Today's TOC FU Call Status:: Successful TOC FU Call Completed TOC FU Call Complete Date: 11/08/23 Patient's Name and Date of Birth confirmed.  Transition Care Management Follow-up Telephone Call Date of Discharge: 11/04/23 Discharge Facility: [facility]) Type of Discharge: Emergency Department Reason for ED Visit: Other: (Dizziness) How have you been since you were released from the hospital?: Better Any questions or concerns?: No  Items Reviewed: Did you receive and understand the discharge instructions provided?: Yes Medications obtained,verified, and reconciled?: Yes (Medications Reviewed) Any new allergies since your discharge?: No Dietary orders reviewed?: NA Do you have support at home?: Yes People in Home: sibling(s)  Medications Reviewed Today: Medications Reviewed Today     Reviewed by Anthoney Harada, LPN (Licensed Practical Nurse) on 11/08/23 at 1030  Med List Status: <None>   Medication Order Taking? Sig Documenting Provider Last Dose Status Informant  acetaminophen (TYLENOL) 500 MG tablet [id number] Yes Take 1,000 mg by mouth 2 (two) times daily as needed for mild pain or moderate pain (leg pain). [provider] Taking Active Family Member  albuterol (VENTOLIN HFA) 108 (90 Base) MCG/ACT inhaler [id number] Yes INHALE 1 TO 2 PUFFS INTO THE LUNGS EVERY 6 HOURS AS NEEDED FOR WHEEZING OR SHORTNESS OF Honor Loh, NP Taking Active   carvedilol (COREG) 3.125 MG tablet [id number] Yes Take 1 tablet (3.125 mg total) by mouth 2 (two) times daily with a meal. [name], FNP Taking Active   dapagliflozin propanediol ([name]) 10 MG TABS tablet [id number] Yes Take 1 tablet (10 mg total) by mouth daily before breakfast. [name], FNP Taking Active   FEROSUL 325 (65 Fe) MG tablet [id number] Yes TAKE 1 TABLET BY MOUTH DAILY [name], FNP Taking Active    finasteride (PROSCAR) 5 MG tablet [id number] Yes Take 5 mg by mouth daily. [provider] Taking Active Family Member  fluticasone (FLONASE) 50 MCG/ACT nasal spray [id number] No Place 2 sprays into both nostrils daily.  Patient not taking: Reported on 11/08/2023   Rosezella Rumpf, PA-C Not Taking Active   furosemide (LASIX) 20 MG tablet [id number] No Take 1 tablet (20 mg total) by mouth as needed.  Patient not taking: Reported on 11/08/2023   Orbie Pyo, MD Not Taking Active   LUMIGAN 0.01 % SOLN [id number] Yes Place 1 drop into both eyes at bedtime. [provider] Taking Active Family Member           Med Note (WHITE, Corinna Gab Jan 27, 2023  9:42 PM) Extra drop placed today, due to blurry vision  meclizine (ANTIVERT) 25 MG tablet [id number] Yes Take 1 tablet (25 mg total) by mouth 3 (three) times daily as needed for dizziness. Rosezella Rumpf, PA-C Taking Active   SIMBRINZA 1-0.2 % SUSP [id number] Yes Follow up with your PCP on when to restart this medication.  Patient taking differently: Place 1 drop into both eyes 3 (three) times daily.   Narda Bonds, MD Taking Active Family Member  Vitamin D, Ergocalciferol, (DRISDOL) 1.25 MG (50000 UNIT) CAPS capsule [id number] Yes Take 50,000 Units by mouth once a week. [provider] Taking Active   Med List Note Larrie Kass, CPhT 01/27/23 2146): Sister, Sebastian Ache, assists patient with medication            Home Care and Equipment/Supplies: Were Home Health Services Ordered?: NA Any new equipment or medical  supplies ordered?: NA  Functional Questionnaire: Do you need assistance with bathing/showering or dressing?: No Do you need assistance with meal preparation?: No Do you need assistance with eating?: No Do you have difficulty maintaining continence: No Do you need assistance with getting out of bed/getting out of a chair/moving?: No Do you have difficulty managing or taking your medications?:  No  Follow up appointments reviewed: PCP Follow-up appointment confirmed?: NA Specialist Hospital Follow-up appointment confirmed?: No Reason Specialist Follow-Up Not Confirmed: Patient has Specialist Provider Number and will Call for Appointment Do you need transportation to your follow-up appointment?: No Do you understand care options if your condition(s) worsen?: Yes-patient verbalized understanding    SIGNATURE Kandis Fantasia, LPN [facility] Health Advisor  [facility] You Are. We Are. [facility] Direct Dial [phone]

## 2023-11-11 ENCOUNTER — Telehealth: Payer: Self-pay

## 2023-11-11 NOTE — Telephone Encounter (Signed)
 Call to check on patient after hospital visit.

## 2023-11-19 DIAGNOSIS — I129 Hypertensive chronic kidney disease with stage 1 through stage 4 chronic kidney disease, or unspecified chronic kidney disease: Secondary | ICD-10-CM | POA: Diagnosis not present

## 2023-11-19 DIAGNOSIS — N1832 Chronic kidney disease, stage 3b: Secondary | ICD-10-CM | POA: Diagnosis not present

## 2023-11-19 DIAGNOSIS — I509 Heart failure, unspecified: Secondary | ICD-10-CM | POA: Diagnosis not present

## 2023-11-19 DIAGNOSIS — K7460 Unspecified cirrhosis of liver: Secondary | ICD-10-CM | POA: Diagnosis not present

## 2023-11-19 DIAGNOSIS — R188 Other ascites: Secondary | ICD-10-CM | POA: Diagnosis not present

## 2023-11-19 LAB — BASIC METABOLIC PANEL WITH GFR
BUN: 15 (ref 4–21)
CO2: 20 (ref 13–22)
Chloride: 104 (ref 99–108)
Creatinine: 1.9 — AB (ref 0.6–1.3)
Glucose: 99
Potassium: 4.7 meq/L (ref 3.5–5.1)
Sodium: 139 (ref 137–147)

## 2023-11-19 LAB — VITAMIN D 25 HYDROXY (VIT D DEFICIENCY, FRACTURES): Vit D, 25-Hydroxy: 41.1

## 2023-11-19 LAB — COMPREHENSIVE METABOLIC PANEL WITH GFR
Albumin: 3.3 — AB (ref 3.5–5.0)
Calcium: 8.7 (ref 8.7–10.7)
eGFR: 34

## 2023-11-23 LAB — LAB REPORT - SCANNED: EGFR: 34

## 2023-12-03 ENCOUNTER — Ambulatory Visit (INDEPENDENT_AMBULATORY_CARE_PROVIDER_SITE_OTHER): Payer: Medicaid Other

## 2023-12-03 DIAGNOSIS — I495 Sick sinus syndrome: Secondary | ICD-10-CM | POA: Diagnosis not present

## 2023-12-03 LAB — CUP PACEART REMOTE DEVICE CHECK
Date Time Interrogation Session: 20250424230318
Implantable Pulse Generator Implant Date: 20201014

## 2023-12-07 NOTE — Progress Notes (Signed)
 Carelink Summary Report / Loop Recorder

## 2023-12-09 DIAGNOSIS — R310 Gross hematuria: Secondary | ICD-10-CM | POA: Diagnosis not present

## 2023-12-16 ENCOUNTER — Encounter: Payer: Self-pay | Admitting: Podiatry

## 2023-12-16 ENCOUNTER — Ambulatory Visit (INDEPENDENT_AMBULATORY_CARE_PROVIDER_SITE_OTHER): Payer: 59 | Admitting: Podiatry

## 2023-12-16 DIAGNOSIS — M79675 Pain in left toe(s): Secondary | ICD-10-CM

## 2023-12-16 DIAGNOSIS — I739 Peripheral vascular disease, unspecified: Secondary | ICD-10-CM | POA: Diagnosis not present

## 2023-12-16 DIAGNOSIS — M79674 Pain in right toe(s): Secondary | ICD-10-CM | POA: Diagnosis not present

## 2023-12-16 DIAGNOSIS — N183 Chronic kidney disease, stage 3 unspecified: Secondary | ICD-10-CM

## 2023-12-16 DIAGNOSIS — B351 Tinea unguium: Secondary | ICD-10-CM

## 2023-12-16 NOTE — Progress Notes (Signed)
 This patient returns to my office for at risk foot care.  This patient requires this care by a professional since this patient will be at risk due to having chronic kidney disease and coagulation defect.  Patient is taking eliquis.   This patient is unable to cut nails himself since the patient cannot reach his nails.These nails are painful walking and wearing shoes.  He presents to the office with his sister.  This patient presents for at risk foot care today.  General Appearance  Alert, conversant and in no acute stress.  Vascular  Dorsalis pedis and posterior tibial  pulses are weakly  palpable  bilaterally.  Capillary return is within normal limits  Bilaterally. Cold feet bilaterally. Absent digital hair  B/L.  Neurologic  Senn-Weinstein monofilament wire test within normal limits  bilaterally. Muscle power within normal limits bilaterally.  Nails Thick disfigured discolored nails with subungual debris  from hallux to fifth toes bilaterally. No evidence of bacterial infection or drainage bilaterally.  Orthopedic  No limitations of motion  feet .  No crepitus or effusions noted.  No bony pathology or digital deformities noted.  HAV  B/L.  Skin  normotropic skin with no porokeratosis noted bilaterally.  No signs of infections or ulcers noted.     Onychomycosis  Pain in right toes  Pain in left toes  Consent was obtained for treatment procedures.   Mechanical debridement of nails 1-5  bilaterally performed with a nail nipper.  Filed with dremel without incident.  Cauterized fourth toe left foot.  Use dremel tool only.   Return office visit     10  weeks                Told patient to return for periodic foot care and evaluation due to potential at risk complications.   Helane Gunther DPM

## 2023-12-23 DIAGNOSIS — H4323 Crystalline deposits in vitreous body, bilateral: Secondary | ICD-10-CM | POA: Diagnosis not present

## 2023-12-23 DIAGNOSIS — H401133 Primary open-angle glaucoma, bilateral, severe stage: Secondary | ICD-10-CM | POA: Diagnosis not present

## 2023-12-23 DIAGNOSIS — H35033 Hypertensive retinopathy, bilateral: Secondary | ICD-10-CM | POA: Diagnosis not present

## 2023-12-29 ENCOUNTER — Ambulatory Visit (INDEPENDENT_AMBULATORY_CARE_PROVIDER_SITE_OTHER): Payer: 59

## 2023-12-29 DIAGNOSIS — Z0000 Encounter for general adult medical examination without abnormal findings: Secondary | ICD-10-CM | POA: Diagnosis not present

## 2023-12-29 DIAGNOSIS — R208 Other disturbances of skin sensation: Secondary | ICD-10-CM | POA: Diagnosis not present

## 2023-12-29 DIAGNOSIS — L820 Inflamed seborrheic keratosis: Secondary | ICD-10-CM | POA: Diagnosis not present

## 2023-12-29 DIAGNOSIS — L538 Other specified erythematous conditions: Secondary | ICD-10-CM | POA: Diagnosis not present

## 2023-12-29 NOTE — Patient Instructions (Signed)
 Trevor Murphy , Thank you for taking time out of your busy schedule to complete your Annual Wellness Visit with me. I enjoyed our conversation and look forward to speaking with you again next year. I, as well as your care team,  appreciate your ongoing commitment to your health goals. Please review the following plan we discussed and let me know if I can assist you in the future. Your Game plan/ To Do List    Referrals: If you haven't heard from the office you've been referred to, please reach out to them at the phone provided.  N/a Follow up Visits: Next Medicare AWV with our clinical staff: 01/24/2025 at 12:20   Have you seen your provider in the last 6 months (3 months if uncontrolled diabetes)? Yes Next Office Visit with your provider: 01/04/2024 at 10:00  Clinician Recommendations:  Aim for 30 minutes of exercise or brisk walking, 6-8 glasses of water , and 5 servings of fruits and vegetables each day.       This is a list of the screening recommended for you and due dates:  Health Maintenance  Topic Date Due   COVID-19 Vaccine (3 - Pfizer risk series) 01/14/2024*   Zoster (Shingles) Vaccine (1 of 2) 03/30/2024*   DTaP/Tdap/Td vaccine (2 - Td or Tdap) 07/04/2024*   Pneumonia Vaccine (1 of 2 - PCV) 12/28/2024*   Flu Shot  03/10/2024   Medicare Annual Wellness Visit  12/28/2024   HPV Vaccine  Aged Out   Meningitis B Vaccine  Aged Out  *Topic was postponed. The date shown is not the original due date.    Advanced directives: (In Chart) A copy of your advanced directives are scanned into your chart should your provider ever need it. Advance Care Planning is important because it:  [x]  Makes sure you receive the medical care that is consistent with your values, goals, and preferences  [x]  It provides guidance to your family and loved ones and reduces their decisional burden about whether or not they are making the right decisions based on your wishes.  Follow the link provided in your  after visit summary or read over the paperwork we have mailed to you to help you started getting your Advance Directives in place. If you need assistance in completing these, please reach out to us  so that we can help you!  See attachments for Preventive Care and Fall Prevention Tips.

## 2023-12-29 NOTE — Progress Notes (Signed)
 Subjective:   Trevor Murphy is a 89 y.o. who presents for a Medicare Wellness preventive visit.  As a reminder, Annual Wellness Visits don't include a physical exam, and some assessments may be limited, especially if this visit is performed virtually. We may recommend an in-person follow-up visit with your provider if needed.  Visit Complete: Virtual I connected with  Trevor Murphy on 12/29/23 by a audio enabled telemedicine application and verified that I am speaking with the correct person using two identifiers.  Patient Location: Home  Provider Location: Office/Clinic  I discussed the limitations of evaluation and management by telemedicine. The patient expressed understanding and agreed to proceed.  Vital Signs: Because this visit was a virtual/telehealth visit, some criteria may be missing or patient reported. Any vitals not documented were not able to be obtained and vitals that have been documented are patient reported.  VideoError- Librarian, academic were attempted between this provider and patient, however failed, due to patient having technical difficulties OR patient did not have access to video capability.  We continued and completed visit with audio only.   Persons Participating in Visit: Patient assisted by sister.  AWV Questionnaire: No: Patient Medicare AWV questionnaire was not completed prior to this visit.  Cardiac Risk Factors include: advanced age (>67men, >40 women);dyslipidemia;hypertension;male gender     Objective:     Today's Vitals   There is no height or weight on file to calculate BMI.     12/29/2023   12:25 PM 11/04/2023    4:43 AM 06/24/2023   11:38 AM 06/24/2023   10:46 AM 01/27/2023    6:54 PM 01/27/2023    5:41 PM 11/20/2022    7:00 PM  Advanced Directives  Does Patient Have a Medical Advance Directive? Yes Yes No No Yes Yes Yes  Type of Estate agent of [place];Living will Healthcare Power of  [employer] [place];Living will Healthcare Power of [place];Living will Living will;Out of facility DNR (pink MOST or yellow form)  Does patient want to make changes to medical advance directive?      No - Patient declined No - Patient declined  Copy of Healthcare Power of Attorney in Chart? Yes - validated most recent copy scanned in chart (See row information)     No - copy requested, Physician notified   Would patient like information on creating a medical advance directive?       No - Patient declined    Current Medications (verified) Outpatient Encounter Medications as of 12/29/2023  Medication Sig   acetaminophen  (TYLENOL ) 500 MG tablet Take 1,000 mg by mouth 2 (two) times daily as needed for mild pain or moderate pain (leg pain).   albuterol  (VENTOLIN  HFA) 108 (90 Base) MCG/ACT inhaler INHALE 1 TO 2 PUFFS INTO THE LUNGS EVERY 6 HOURS AS NEEDED FOR WHEEZING OR SHORTNESS OF BREATH   dapagliflozin  propanediol ([name] ) 10 MG TABS tablet Take 1 tablet (10 mg total) by mouth daily before breakfast.   FEROSUL 325 (65 Fe) MG tablet TAKE 1 TABLET BY MOUTH DAILY   finasteride  (PROSCAR ) 5 MG tablet Take 5 mg by mouth daily.   LUMIGAN 0.01 % SOLN Place 1 drop into both eyes at bedtime.   meclizine  (ANTIVERT ) 25 MG tablet Take 1 tablet (25 mg total) by mouth 3 (three) times daily as needed for dizziness.   SIMBRINZA  1-0.2 % SUSP Follow up with your PCP on when to restart this medication. (Patient taking  differently: Place 1 drop into both eyes 3 (three) times daily.)   vitamin D3 (CHOLECALCIFEROL) 25 MCG tablet Take 1,000 Units by mouth daily.   carvedilol  (COREG ) 3.125 MG tablet Take 1 tablet (3.125 mg total) by mouth 2 (two) times daily with a meal.   fluticasone  (FLONASE ) 50 MCG/ACT nasal spray Place 2 sprays into both nostrils daily. (Patient not taking: Reported on 12/29/2023)   furosemide  (LASIX ) 20 MG tablet Take 1 tablet (20 mg total) by mouth as needed. (Patient not taking:  Reported on 11/01/2023)   Vitamin D, Ergocalciferol, (DRISDOL) 1.25 MG (50000 UNIT) CAPS capsule Take 50,000 Units by mouth once a week. (Patient not taking: Reported on 12/29/2023)   No facility-administered encounter medications on file as of 12/29/2023.    Allergies (verified) Patient has no known allergies.   History: Past Medical History:  Diagnosis Date   Arthritis    CAD (coronary artery disease)    a. Cath 08/2009: minimal nonobstructive disease (25% LAD). b. Lexiscan  nuc 6/215: EF 54%, no ischemia or infarction.   Chronic diastolic CHF (congestive heart failure) (HCC)    a. Echo 2013: EF 55-60%, mild AI, mild MR. b. Echo 5/15 with EF 60-65%, no WMA, mild LVH, mild AI.   CKD (chronic kidney disease) stage 3, GFR 30-59 ml/min (HCC) 07/26/2012   a. Felt likely due to HTN.   CVA (cerebral infarction) 07/28/2012   Glaucoma    Gout    H/O umbilical hernia repair    Headache(784.0)    " WHEN MY BLOOD PRESSURE IS UP"   History of acute renal failure    History of alcohol  abuse    HTN (hypertension)    Hypertension    a. Patent renal arteries 2005.   Noncompliance    OA (osteoarthritis)    Other and unspecified hyperlipidemia    Poor circulation    Sinus bradycardia    SVT (supraventricular tachycardia) (HCC)    a. Possible AVNRT.  Got adenosine  from EMS in 2015 for SVT.  During 5/15 hospitalization had episode of short R-P tachycardia (possible AVNRT) terminated by adenosine .  b. 04/2016 - recurrent tachycardia (SVT vs. AVNRT), again terminated by Adenosine    Visual impairment    Past Surgical History:  Procedure Laterality Date   APPENDECTOMY     BIOPSY  08/12/2020   Procedure: BIOPSY;  Surgeon: Tami Falcon, MD;  Location: [facility] ENDOSCOPY;  Service: Endoscopy;;   BUBBLE STUDY  05/24/2019   Procedure: BUBBLE STUDY;  Surgeon: Wendie Hamburg, MD;  Location: [facility] ENDOSCOPY;  Service: Endoscopy;;   CARDIAC CATHETERIZATION  11/2003   EF 50-55%   CARDIAC CATHETERIZATION   08/2009   CATARACT EXTRACTION EXTRACAPSULAR Right 05/17/2013   Procedure: CATARACT EXTRACTION EXTRACAPSULAR WITH INTRAOCULAR LENS PLACEMENT (IOC) RIGHT EYE;  Surgeon: Ben Bracken, MD;  Location: [facility] OR;  Service: Ophthalmology;  Laterality: Right;   COLONOSCOPY     ESOPHAGOGASTRODUODENOSCOPY (EGD) WITH PROPOFOL  N/A 08/12/2020   Procedure: ESOPHAGOGASTRODUODENOSCOPY (EGD) WITH PROPOFOL ;  Surgeon: Tami Falcon, MD;  Location[facility] ENDOSCOPY;  Service: Endoscopy;  Laterality: N/A;   HERNIA REPAIR     INGUINAL HERNIA REPAIR Left 11/20/2022   Procedure: OPEN LEFT INGUINAL HERNIA REPAIR FOR GANGRENE AND SMALL BOWEL RESECTION WITH STINASTIMOSIST;  Surgeon: Kinsinger, Alphonso Aschoff, MD;  Location: [facility] OR;  Service: General;  Laterality: Left;   IR PARACENTESIS  09/24/2022   LOOP RECORDER INSERTION N/A 05/24/2019   Procedure: LOOP RECORDER INSERTION;  Surgeon: Lei Pump, MD;  Location: [facility] INVASIVE CV LAB;  Service: Cardiovascular;  Laterality: N/A;   TEE WITHOUT CARDIOVERSION N/A 05/24/2019   Procedure: TRANSESOPHAGEAL ECHOCARDIOGRAM (TEE);  Surgeon: Wendie Hamburg, MD;  Location: [facility] ENDOSCOPY;  Service: Endoscopy;  Laterality: N/A;   VENTRAL HERNIA REPAIR  03/07/2012   Procedure: LAPAROSCOPIC VENTRAL HERNIA;  Surgeon: Diantha Fossa, MD;  Location: [facility] OR;  Service: General;  Laterality: N/A;   Family History  Problem Relation Age of Onset   CVA Mother    Hypertension Brother    Hypertension Sister    Hypertension Brother    Stroke Sister    Heart attack Neg Hx    Social History   Socioeconomic History   Marital status: Married    Spouse name: Not on file   Number of children: Not on file   Years of education: Not on file   Highest education level: Not on file  Occupational History   Occupation: retired  Tobacco Use   Smoking status: Never   Smokeless tobacco: Never  Vaping Use   Vaping status: Never Used  Substance and Sexual Activity   Alcohol  use: No    Alcohol /week: 1.0  standard drink of alcohol     Types: 1 Cans of beer per week    Comment: h/o heavy use, quit in 2017   Drug use: No   Sexual activity: Not Currently  Other Topics Concern   Not on file  Social History Narrative   Not on file   Social Drivers of Health   Financial Resource Strain: Low Risk  (12/29/2023)   Overall Financial Resource Strain (CARDIA)    Difficulty of Paying Living Expenses: Not hard at all  Food Insecurity: No Food Insecurity (12/29/2023)   Hunger Vital Sign    Worried About Running Out of Food in the Last Year: Never true    Ran Out of Food in the Last Year: Never true  Transportation Needs: No Transportation Needs (12/29/2023)   PRAPARE - Administrator, Civil Service (Medical): No    Lack of Transportation (Non-Medical): No  Physical Activity: Inactive (12/29/2023)   Exercise Vital Sign    Days of Exercise per Week: 0 days    Minutes of Exercise per Session: 0 min  Stress: No Stress Concern Present (12/29/2023)   [employer] of Occupational Health - Occupational Stress Questionnaire    Feeling of Stress : Not at all  Social Connections: Socially Isolated (12/29/2023)   Social Connection and Isolation Panel [NHANES]    Frequency of Communication with Friends and Family: More than three times a week    Frequency of Social Gatherings with Friends and Family: More than three times a week    Attends Religious Services: Never    Database administrator or Organizations: No    Attends Banker Meetings: Never    Marital Status: Widowed    Tobacco Counseling Counseling given: Not Answered    Clinical Intake:  Pre-visit preparation completed: Yes  Pain : No/denies pain     Nutritional Risks: None Diabetes: No  Lab Results  Component Value Date   HGBA1C 5.4 04/20/2022   HGBA1C 5.6 11/03/2021   HGBA1C 5.4 04/15/2021     How often do you need to have someone help you when you read instructions, pamphlets, or other written  materials from your doctor or pharmacy?: 1 - Never  Interpreter Needed?: No  Information entered by :: NAllen LPN   Activities of Daily Living     12/29/2023   12:18 PM  In your present state of health, do you have any difficulty performing the following activities:  Hearing? 1  Comment trouble hearing no hearing aids  Vision? 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 1  Comment trouble with memory  Walking or climbing stairs? 1  Dressing or bathing? 0  Doing errands, shopping? 1  Preparing Food and eating ? Y  Comment son brings meals daily  Using the Toilet? N  In the past six months, have you accidently leaked urine? N  Do you have problems with loss of bowel control? N  Managing your Medications? Y  Comment sister sets up  Managing your Finances? N  Housekeeping or managing your Housekeeping? Y    Patient Care Team: Susanna Epley, FNP as PCP - General (General Practice) Thukkani, Arun K, MD as PCP - Cardiology (Cardiology) Nathen Balder, Skeeter Dukes, RN as VBCI Care Management Little, Skeeter Dukes, RN  Indicate any recent Medical Services you may have received from other than [facility] providers in the past year (date may be approximate).     Assessment:    This is a routine wellness examination for Trevor Murphy.  Hearing/Vision screen Hearing Screening - Comments:: Trouble hearing. But no hearing aids Vision Screening - Comments:: Regular eye exams, Dr. Gwendalyn Lemma   Goals Addressed             This Visit's Progress    Patient Stated       12/29/2023, denies goals       Depression Screen     12/29/2023   12:26 PM 04/27/2023   10:06 AM 10/21/2022   12:41 PM 08/06/2022    9:46 AM 06/18/2022    2:44 PM 04/20/2022   10:14 AM 09/17/2021   11:45 AM  PHQ 2/9 Scores  PHQ - 2 Score 0 0 0 0 0 0 0  PHQ- 9 Score 0 0         Fall Risk     12/29/2023   12:25 PM 04/27/2023   10:06 AM 10/21/2022   12:40 PM 08/06/2022     9:45 AM 06/18/2022    2:44 PM  Fall Risk   Falls in the past year? 0 1 1 1  0  Comment   legs give out    Number falls in past yr: 0 0 1 0 0  Injury with Fall? 0 0 0 1 0  Risk for fall due to : Impaired mobility;Impaired balance/gait;Medication side effect;Impaired vision Impaired balance/gait;Impaired mobility History of fall(s);Impaired balance/gait;Impaired mobility;Medication side effect History of fall(s) Impaired balance/gait;Impaired mobility  Follow up Falls evaluation completed;Falls prevention discussed Falls evaluation completed Falls prevention discussed;Education provided;Falls evaluation completed Falls evaluation completed Falls evaluation completed    MEDICARE RISK AT HOME:  Medicare Risk at Home Any stairs in or around the home?: No If so, are there any without handrails?: No Home free of loose throw rugs in walkways, pet beds, electrical cords, etc?: Yes Adequate lighting in your home to reduce risk of falls?: Yes Life alert?: No Use of a cane, walker or w/c?: Yes Grab bars in the bathroom?: Yes Shower chair or bench in shower?: Yes Elevated toilet seat or a handicapped toilet?: Yes  TIMED UP AND GO:  Was the test performed?  No  Cognitive Function: 6CIT completed        12/29/2023   12:27 PM 10/21/2022   12:43 PM 09/17/2021   11:47 AM 09/12/2020   11:41  AM 08/08/2019   10:43 AM  6CIT Screen  What Year? 0 points 0 points 0 points 4 points 0 points  What month? 0 points 0 points 0 points 0 points 0 points  What time? 3 points 0 points 0 points 3 points 3 points  Count back from 20 0 points 0 points 0 points 0 points 0 points  Months in reverse 4 points 4 points 4 points 4 points 4 points  Repeat phrase 4 points 10 points 10 points 10 points 10 points  Total Score 11 points 14 points 14 points 21 points 17 points    Immunizations Immunization History  Administered Date(s) Administered   PFIZER Comirnaty(Gray Top)Covid-19 Tri-Sucrose Vaccine 09/10/2020    PFIZER(Purple Top)SARS-COV-2 Vaccination 08/20/2020   Tdap 06/21/2013    Screening Tests Health Maintenance  Topic Date Due   Pneumonia Vaccine 55+ Years old (1 of 2 - PCV) Never done   Zoster Vaccines- Shingrix (1 of 2) Never done   COVID-19 Vaccine (3 - Pfizer risk series) 10/08/2020   DTaP/Tdap/Td (2 - Td or Tdap) 07/04/2024 (Originally 06/22/2023)   INFLUENZA VACCINE  03/10/2024   Medicare Annual Wellness (AWV)  12/28/2024   HPV VACCINES  Aged Out   Meningococcal B Vaccine  Aged Out    Health Maintenance  Health Maintenance Due  Topic Date Due   Pneumonia Vaccine 7+ Years old (1 of 2 - PCV) Never done   Zoster Vaccines- Shingrix (1 of 2) Never done   COVID-19 Vaccine (3 - Pfizer risk series) 10/08/2020   Health Maintenance Items Addressed: Declines vaccines  Additional Screening:  Vision Screening: Recommended annual ophthalmology exams for early detection of glaucoma and other disorders of the eye.  Dental Screening: Recommended annual dental exams for proper oral hygiene  Community Resource Referral / Chronic Care Management: CRR required this visit?  No   CCM required this visit?  No   Plan:    I have personally reviewed and noted the following in the patient's chart:   Medical and social history Use of alcohol , tobacco or illicit drugs  Current medications and supplements including opioid prescriptions. Patient is not currently taking opioid prescriptions. Functional ability and status Nutritional status Physical activity Advanced directives List of other physicians Hospitalizations, surgeries, and ER visits in previous 12 months Vitals Screenings to include cognitive, depression, and falls Referrals and appointments  In addition, I have reviewed and discussed with patient certain preventive protocols, quality metrics, and best practice recommendations. A written personalized care plan for preventive services as well as general preventive health  recommendations were provided to patient.   Areatha Beecham, LPN   6/96/2952   After Visit Summary: (Pick Up) Due to this being a telephonic visit, with patients personalized plan was offered to patient and patient has requested to Pick up at office.  Notes: Nothing significant to report at this time.

## 2023-12-31 DIAGNOSIS — N281 Cyst of kidney, acquired: Secondary | ICD-10-CM | POA: Diagnosis not present

## 2023-12-31 DIAGNOSIS — K8020 Calculus of gallbladder without cholecystitis without obstruction: Secondary | ICD-10-CM | POA: Diagnosis not present

## 2023-12-31 DIAGNOSIS — R310 Gross hematuria: Secondary | ICD-10-CM | POA: Diagnosis not present

## 2023-12-31 DIAGNOSIS — N289 Disorder of kidney and ureter, unspecified: Secondary | ICD-10-CM | POA: Diagnosis not present

## 2024-01-04 ENCOUNTER — Ambulatory Visit (INDEPENDENT_AMBULATORY_CARE_PROVIDER_SITE_OTHER): Payer: 59 | Admitting: Nurse Practitioner

## 2024-01-04 ENCOUNTER — Encounter: Payer: Self-pay | Admitting: Nurse Practitioner

## 2024-01-04 VITALS — BP 120/70 | HR 65 | Temp 98.8°F | Ht 68.0 in | Wt 144.4 lb

## 2024-01-04 DIAGNOSIS — I2510 Atherosclerotic heart disease of native coronary artery without angina pectoris: Secondary | ICD-10-CM | POA: Diagnosis not present

## 2024-01-04 DIAGNOSIS — R310 Gross hematuria: Secondary | ICD-10-CM | POA: Diagnosis not present

## 2024-01-04 DIAGNOSIS — D509 Iron deficiency anemia, unspecified: Secondary | ICD-10-CM | POA: Diagnosis not present

## 2024-01-04 DIAGNOSIS — I5032 Chronic diastolic (congestive) heart failure: Secondary | ICD-10-CM

## 2024-01-04 DIAGNOSIS — I130 Hypertensive heart and chronic kidney disease with heart failure and stage 1 through stage 4 chronic kidney disease, or unspecified chronic kidney disease: Secondary | ICD-10-CM | POA: Diagnosis not present

## 2024-01-04 DIAGNOSIS — Z95811 Presence of heart assist device: Secondary | ICD-10-CM | POA: Diagnosis not present

## 2024-01-04 DIAGNOSIS — N1832 Chronic kidney disease, stage 3b: Secondary | ICD-10-CM | POA: Diagnosis not present

## 2024-01-04 LAB — POCT URINALYSIS DIPSTICK
Bilirubin, UA: NEGATIVE
Glucose, UA: POSITIVE — AB
Ketones, UA: NEGATIVE
Leukocytes, UA: NEGATIVE
Nitrite, UA: NEGATIVE
Protein, UA: POSITIVE — AB
Spec Grav, UA: 1.02 (ref 1.010–1.025)
Urobilinogen, UA: 0.2 U/dL
pH, UA: 5.5 (ref 5.0–8.0)

## 2024-01-04 MED ORDER — FERROUS SULFATE 325 (65 FE) MG PO TABS: 325.0000 mg | ORAL_TABLET | Freq: Every day | ORAL | 1 refills | Status: AC

## 2024-01-04 NOTE — Progress Notes (Signed)
 [place], CMA,acting as a Neurosurgeon for Susanna Epley, FNP.,have documented all relevant documentation on the behalf of Susanna Epley, FNP,as directed by  Susanna Epley, FNP while in the presence of Susanna Epley, FNP.  Subjective:  Patient ID: Trevor Murphy , male    DOB: 12-Jun-1935 , 89 y.o.   MRN: [id number]  Chief Complaint  Patient presents with   Hypertension    Patient presents today for a bp follow up, Patient reports compliance with medication. Patient denies any chest pain, SOB, or headaches. Patient has no concerns today.     HPI  He is a male patient with a history of kidney issues, here today for his blood pressure and cholesterol f/u.  He recently visited a urologist and kidney doctor. There was a history of blood in his urine. A CT scan was attempted but contrast couldn't be used due to declined kidney levels. His kidney function has improved since his most recent visit, with his eGFR now better, classified as Stage 3B chronic kidney disease. He has gained 14 pounds since March     Past Medical History:  Diagnosis Date   Acute blood loss anemia 08/11/2020   Acute on chronic renal failure (HCC) 07/27/2012   Alcohol  abuse 01/19/2014   Arthritis    CAD (coronary artery disease)    a. Cath 08/2009: minimal nonobstructive disease (25% LAD). b. Lexiscan  nuc 6/215: EF 54%, no ischemia or infarction.   Chronic diastolic CHF (congestive heart failure) (HCC)    a. Echo 2013: EF 55-60%, mild AI, mild MR. b. Echo 5/15 with EF 60-65%, no WMA, mild LVH, mild AI.   CKD (chronic kidney disease) stage 3, GFR 30-59 ml/min (HCC) 07/26/2012   a. Felt likely due to HTN.   CVA (cerebral infarction) 07/28/2012   Fall 07/05/2023   Fracture of inferior pubic ramus with routine healing 07/05/2023   Glaucoma    Gout    H/O umbilical hernia repair    Headache(784.0)    " WHEN MY BLOOD PRESSURE IS UP"   Hiatal hernia 09/23/2022   History of acute renal failure    History of alcohol  abuse     HTN (hypertension)    Hyperkalemia 01/27/2023   Hypertension    a. Patent renal arteries 2005.   Noncompliance    OA (osteoarthritis)    Other and unspecified hyperlipidemia    Other ascites 09/23/2022   Poor circulation    Sinus bradycardia    SVT (supraventricular tachycardia) (HCC)    a. Possible AVNRT.  Got adenosine  from EMS in 2015 for SVT.  During 5/15 hospitalization had episode of short R-P tachycardia (possible AVNRT) terminated by adenosine .  b. 04/2016 - recurrent tachycardia (SVT vs. AVNRT), again terminated by Adenosine    Symptomatic bradycardia 02/02/2023   Syncope 01/27/2023   Visual impairment    Weakness 09/23/2022     Family History  Problem Relation Age of Onset   CVA Mother    Hypertension Brother    Hypertension Sister    Hypertension Brother    Stroke Sister    Heart attack Neg Hx      Current Outpatient Medications:    acetaminophen  (TYLENOL ) 500 MG tablet, Take 1,000 mg by mouth 2 (two) times daily as needed for mild pain or moderate pain (leg pain)., Disp: , Rfl:    albuterol  (VENTOLIN  HFA) 108 (90 Base) MCG/ACT inhaler, INHALE 1 TO 2 PUFFS INTO THE LUNGS EVERY 6 HOURS AS NEEDED FOR WHEEZING OR SHORTNESS OF BREATH,  Disp: 6.7 g, Rfl: 0   dapagliflozin  propanediol ([name] ) 10 MG TABS tablet, Take 1 tablet (10 mg total) by mouth daily before breakfast., Disp: 90 tablet, Rfl: 2   finasteride  (PROSCAR ) 5 MG tablet, Take 5 mg by mouth daily., Disp: , Rfl:    fluticasone  (FLONASE ) 50 MCG/ACT nasal spray, Place 2 sprays into both nostrils daily., Disp: 16 g, Rfl: 0   furosemide  (LASIX ) 20 MG tablet, Take 1 tablet (20 mg total) by mouth as needed., Disp: , Rfl:    LUMIGAN 0.01 % SOLN, Place 1 drop into both eyes at bedtime., Disp: , Rfl:    meclizine  (ANTIVERT ) 25 MG tablet, Take 1 tablet (25 mg total) by mouth 3 (three) times daily as needed for dizziness., Disp: 30 tablet, Rfl: 0   SIMBRINZA  1-0.2 % SUSP, Follow up with your PCP on when to restart this  medication. (Patient taking differently: Place 1 drop into both eyes 3 (three) times daily.), Disp: , Rfl:    Vitamin D , Ergocalciferol , (DRISDOL) 1.25 MG (50000 UNIT) CAPS capsule, Take 50,000 Units by mouth once a week., Disp: , Rfl:    vitamin D3 (CHOLECALCIFEROL) 25 MCG tablet, Take 1,000 Units by mouth daily., Disp: , Rfl:    carvedilol  (COREG ) 3.125 MG tablet, Take 1 tablet (3.125 mg total) by mouth 2 (two) times daily with a meal., Disp: 180 tablet, Rfl: 2   ferrous sulfate  (FEROSUL) 325 (65 FE) MG tablet, Take 1 tablet (325 mg total) by mouth daily., Disp: 90 tablet, Rfl: 1   No Known Allergies   Review of Systems  Constitutional: Negative.   HENT: Negative.    Eyes: Negative.   Respiratory: Negative.    Cardiovascular: Negative.   Gastrointestinal: Negative.   Musculoskeletal:  Positive for gait problem.     Today's Vitals   01/04/24 1014  BP: 120/70  Pulse: 65  Temp: 98.8 F (37.1 C)  TempSrc: Oral  Weight: 144 lb 6.4 oz (65.5 kg)  Height: 5\' 8"  (1.727 m)  PainSc: 0-No pain   Body mass index is 21.96 kg/m.  Wt Readings from Last 3 Encounters:  01/04/24 144 lb 6.4 oz (65.5 kg)  11/04/23 130 lb (59 kg)  08/31/23 137 lb 12.8 oz (62.5 kg)    The ASCVD Risk score (Arnett DK, et al., 2019) failed to calculate for the following reasons:   The 2019 ASCVD risk score is only valid for ages 80 to 46   Risk score cannot be calculated because patient has a medical history suggesting prior/existing ASCVD  Objective:  Physical Exam Vitals and nursing note reviewed.  Constitutional:      General: He is not in acute distress.    Appearance: Normal appearance.  Cardiovascular:     Rate and Rhythm: Normal rate.     Pulses: Normal pulses.     Heart sounds: Normal heart sounds. No murmur heard. Pulmonary:     Effort: Pulmonary effort is normal.     Breath sounds: Normal breath sounds. No wheezing.  Musculoskeletal:        General: No swelling or tenderness. Normal range of  motion.     Comments: In a wheelchair  Skin:    General: Skin is warm and dry.     Capillary Refill: Capillary refill takes less than 2 seconds.  Neurological:     General: No focal deficit present.     Mental Status: He is alert and oriented to person, place, and time. Mental status is at baseline.  Motor: No weakness.  Psychiatric:        Mood and Affect: Mood normal.        Behavior: Behavior normal.        Thought Content: Thought content normal.        Judgment: Judgment normal.         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 No current issues, no edema noted   Iron deficiency anemia, unspecified iron deficiency anemia type Assessment & Plan: Continues with iron supplement, will recheck levels  Orders: -     Ferrous Sulfate ; Take 1 tablet (325 mg total) by mouth daily.  Dispense: 90 tablet; Refill: 1 -     CBC -     Iron, TIBC and Ferritin Panel  Coronary artery disease involving native coronary artery of native heart without angina pectoris Assessment & Plan: Continue f/u with Cardiology   Gross hematuria Assessment & Plan: He is currently getting a workup with Urology, family reports patient provided another sample, has large blood, glucose and protein in urine, will send urine culture.    Orders: -     POCT urinalysis dipstick -     Urine Culture  Presence of heart assist device (HCC)  Chronic kidney disease, stage 3b (HCC) Assessment & Plan: Improved from stage 4 CKD     Return for keep same next.  Patient was given opportunity to ask questions. Patient verbalized understanding of the plan and was able to repeat key elements of the  plan. All questions were answered to their satisfaction.    Inge Mangle, FNP, have reviewed all documentation for this visit. The documentation on 01/04/24 for the exam, diagnosis, procedures, and orders are all accurate and complete.   IF YOU HAVE BEEN REFERRED TO A SPECIALIST, IT MAY TAKE 1-2 WEEKS TO SCHEDULE/PROCESS THE REFERRAL. IF YOU HAVE NOT HEARD FROM US /SPECIALIST IN TWO WEEKS, PLEASE GIVE US  A CALL AT [phone].

## 2024-01-04 NOTE — Assessment & Plan Note (Signed)
 Improved from stage 4 CKD

## 2024-01-05 ENCOUNTER — Ambulatory Visit: Payer: Self-pay | Admitting: Nurse Practitioner

## 2024-01-05 LAB — IRON,TIBC AND FERRITIN PANEL
Ferritin: 220 ng/mL (ref 30–400)
Iron Saturation: 28 % (ref 15–55)
Iron: 59 ug/dL (ref 38–169)
Total Iron Binding Capacity: 213 ug/dL — ABNORMAL LOW (ref 250–450)
UIBC: 154 ug/dL (ref 111–343)

## 2024-01-05 LAB — CBC
Hematocrit: 44.2 % (ref 37.5–51.0)
Hemoglobin: 14.9 g/dL (ref 13.0–17.7)
MCH: 30.3 pg (ref 26.6–33.0)
MCHC: 33.7 g/dL (ref 31.5–35.7)
MCV: 90 fL (ref 79–97)
Platelets: 71 10*3/uL — CL (ref 150–450)
RBC: 4.91 x10E6/uL (ref 4.14–5.80)
RDW: 15 % (ref 11.6–15.4)
WBC: 3.2 10*3/uL — ABNORMAL LOW (ref 3.4–10.8)

## 2024-01-05 LAB — URINE CULTURE

## 2024-01-07 ENCOUNTER — Encounter: Payer: Self-pay | Admitting: Nurse Practitioner

## 2024-01-07 ENCOUNTER — Ambulatory Visit (INDEPENDENT_AMBULATORY_CARE_PROVIDER_SITE_OTHER): Payer: Medicaid Other

## 2024-01-07 ENCOUNTER — Ambulatory Visit: Payer: Self-pay | Admitting: Cardiology

## 2024-01-07 DIAGNOSIS — I495 Sick sinus syndrome: Secondary | ICD-10-CM

## 2024-01-07 LAB — CUP PACEART REMOTE DEVICE CHECK
Date Time Interrogation Session: 20250529231443
Implantable Pulse Generator Implant Date: 20201014

## 2024-01-08 ENCOUNTER — Encounter: Payer: Self-pay | Admitting: Nurse Practitioner

## 2024-01-08 NOTE — Assessment & Plan Note (Signed)
 Continues with iron supplement, will recheck levels

## 2024-01-08 NOTE — Assessment & Plan Note (Signed)
 He is currently getting a workup with Urology, family reports patient provided another sample, has large blood, glucose and protein in urine, will send urine culture.

## 2024-01-08 NOTE — Assessment & Plan Note (Signed)
 Continue f/u with Cardiology. No current issues, no edema noted

## 2024-01-08 NOTE — Assessment & Plan Note (Signed)
 Blood pressure is well controlled, continue current medications and f/u with Cardiology

## 2024-01-08 NOTE — Assessment & Plan Note (Signed)
Continue f/u with Cardiology.

## 2024-01-09 ENCOUNTER — Other Ambulatory Visit: Payer: Self-pay

## 2024-01-09 ENCOUNTER — Emergency Department (HOSPITAL_COMMUNITY)
Admission: EM | Admit: 2024-01-09 | Discharge: 2024-01-09 | Disposition: A | Discharge status: Home / Self Care | Attending: Emergency Medicine | Admitting: Emergency Medicine

## 2024-01-09 ENCOUNTER — Encounter (HOSPITAL_COMMUNITY): Payer: Self-pay

## 2024-01-09 DIAGNOSIS — Z79899 Other long term (current) drug therapy: Secondary | ICD-10-CM | POA: Diagnosis not present

## 2024-01-09 DIAGNOSIS — I130 Hypertensive heart and chronic kidney disease with heart failure and stage 1 through stage 4 chronic kidney disease, or unspecified chronic kidney disease: Secondary | ICD-10-CM | POA: Insufficient documentation

## 2024-01-09 DIAGNOSIS — I509 Heart failure, unspecified: Secondary | ICD-10-CM | POA: Diagnosis not present

## 2024-01-09 DIAGNOSIS — I160 Hypertensive urgency: Secondary | ICD-10-CM

## 2024-01-09 DIAGNOSIS — R319 Hematuria, unspecified: Secondary | ICD-10-CM | POA: Diagnosis not present

## 2024-01-09 DIAGNOSIS — N189 Chronic kidney disease, unspecified: Secondary | ICD-10-CM | POA: Insufficient documentation

## 2024-01-09 DIAGNOSIS — I2510 Atherosclerotic heart disease of native coronary artery without angina pectoris: Secondary | ICD-10-CM | POA: Insufficient documentation

## 2024-01-09 LAB — CBC
HCT: 47.5 % (ref 39.0–52.0)
Hemoglobin: 15.1 g/dL (ref 13.0–17.0)
MCH: 29.7 pg (ref 26.0–34.0)
MCHC: 31.8 g/dL (ref 30.0–36.0)
MCV: 93.3 fL (ref 80.0–100.0)
Platelets: 62 10*3/uL — ABNORMAL LOW (ref 150–400)
RBC: 5.09 MIL/uL (ref 4.22–5.81)
RDW: 16 % — ABNORMAL HIGH (ref 11.5–15.5)
WBC: 3.3 10*3/uL — ABNORMAL LOW (ref 4.0–10.5)
nRBC: 0 % (ref 0.0–0.2)

## 2024-01-09 LAB — BASIC METABOLIC PANEL WITH GFR
Anion gap: 8 (ref 5–15)
BUN: 14 mg/dL (ref 8–23)
CO2: 22 mmol/L (ref 22–32)
Calcium: 8.4 mg/dL — ABNORMAL LOW (ref 8.9–10.3)
Chloride: 107 mmol/L (ref 98–111)
Creatinine, Ser: 1.75 mg/dL — ABNORMAL HIGH (ref 0.61–1.24)
GFR, Estimated: 37 mL/min — ABNORMAL LOW (ref >60)
Glucose, Bld: 103 mg/dL — ABNORMAL HIGH (ref 70–99)
Potassium: 4.7 mmol/L (ref 3.5–5.1)
Sodium: 137 mmol/L (ref 135–145)

## 2024-01-09 LAB — URINALYSIS, ROUTINE W REFLEX MICROSCOPIC
Bacteria, UA: NONE SEEN
Bilirubin Urine: NEGATIVE
Glucose, UA: >=500 mg/dL — AB
Ketones, ur: NEGATIVE mg/dL
Leukocytes,Ua: NEGATIVE
Nitrite: NEGATIVE
Protein, ur: 100 mg/dL — AB
RBC / HPF: >50 RBC/hpf (ref 0–5)
Specific Gravity, Urine: 1.008 (ref 1.005–1.030)
pH: 6 (ref 5.0–8.0)

## 2024-01-09 NOTE — ED Notes (Signed)
 UA sample sent to lab @1820 

## 2024-01-09 NOTE — Discharge Instructions (Signed)
 Follow-up with the urologist in regards to your painful urination and blood in the urine.  Your blood pressure was also elevated today, make sure you take your blood pressure medicines when you get home and follow-up with your primary care physician.  If you develop new or worsening urinary symptoms, flank, back, or abdominal pain, or any other new/concerning symptoms then return to the ER.

## 2024-01-09 NOTE — ED Provider Notes (Signed)
 [place] EMERGENCY DEPARTMENT AT [facility] Provider Note   CSN: [id number] Arrival date & time: 01/09/24  1530     History  Chief Complaint  Patient presents with   Decreased Urination    Trevor Murphy is a 89 y.o. male.  HPI 89 year old male with a history of CKD, CVA, CHF, CAD, and other comorbidities presents with trouble urinating.  He has been having some painful urination and hematuria since yesterday.  He feels like he empties his bladder but not much comes out.  He denies any abdominal or back pain.  No chest pain.  Son tells me that symptoms started after he drank a large amount of whiskey yesterday which she has not drank in a while.  Home Medications Prior to Admission medications   Medication Sig Start Date End Date Taking? Authorizing Provider  acetaminophen  (TYLENOL ) 500 MG tablet Take 1,000 mg by mouth 2 (two) times daily as needed for mild pain or moderate pain (leg pain).    [provider]  albuterol  (VENTOLIN  HFA) 108 (90 Base) MCG/ACT inhaler INHALE 1 TO 2 PUFFS INTO THE LUNGS EVERY 6 HOURS AS NEEDED FOR WHEEZING OR SHORTNESS OF BREATH 03/29/23   Melodie Spry, NP  carvedilol  (COREG ) 3.125 MG tablet Take 1 tablet (3.125 mg total) by mouth 2 (two) times daily with a meal. 08/31/23 11/29/23  Susanna Epley, FNP  dapagliflozin  propanediol ([name] ) 10 MG TABS tablet Take 1 tablet (10 mg total) by mouth daily before breakfast. 04/27/23   Susanna Epley, FNP  ferrous sulfate  (FEROSUL) 325 (65 FE) MG tablet Take 1 tablet (325 mg total) by mouth daily. 01/04/24   Susanna Epley, FNP  finasteride  (PROSCAR ) 5 MG tablet Take 5 mg by mouth daily. 03/09/22   [provider]  fluticasone  (FLONASE ) 50 MCG/ACT nasal spray Place 2 sprays into both nostrils daily. 11/04/23   Kehrli, Kelsey F, PA-C  furosemide  (LASIX ) 20 MG tablet Take 1 tablet (20 mg total) by mouth as needed. 07/21/23   Thukkani, Arun K, MD  LUMIGAN 0.01 % SOLN Place 1 drop into both eyes at  bedtime. 08/28/19   [provider]  meclizine  (ANTIVERT ) 25 MG tablet Take 1 tablet (25 mg total) by mouth 3 (three) times daily as needed for dizziness. 11/04/23   Everlyn Hockey, PA-C  SIMBRINZA  1-0.2 % SUSP Follow up with your PCP on when to restart this medication. Patient taking differently: Place 1 drop into both eyes 3 (three) times daily. 06/09/22   Verlyn Goad, MD  Vitamin D , Ergocalciferol , (DRISDOL) 1.25 MG (50000 UNIT) CAPS capsule Take 50,000 Units by mouth once a week. 07/16/23   [provider]  vitamin D3 (CHOLECALCIFEROL) 25 MCG tablet Take 1,000 Units by mouth daily.    [provider]      Allergies    Patient has no known allergies.    Review of Systems   Review of Systems  Cardiovascular:  Negative for chest pain.  Gastrointestinal:  Negative for abdominal pain.  Genitourinary:  Positive for dysuria and hematuria. Negative for penile pain.  Musculoskeletal:  Negative for back pain.    Physical Exam Updated Vital Signs BP (!) 176/75 (BP Location: Right Arm)   Pulse (!) 51   Temp 97.9 F (36.6 C)   Resp 17   Ht 5\' 10"  (1.778 m)   Wt 65.3 kg   SpO2 100%   BMI 20.66 kg/m  Physical Exam Vitals and nursing note reviewed.  Constitutional:  General: He is not in acute distress.    Appearance: He is well-developed. He is not ill-appearing or diaphoretic.  HENT:     Head: Normocephalic and atraumatic.  Cardiovascular:     Rate and Rhythm: Normal rate and regular rhythm.     Heart sounds: Normal heart sounds.  Pulmonary:     Effort: Pulmonary effort is normal.     Breath sounds: Normal breath sounds.  Abdominal:     General: There is no distension.     Palpations: Abdomen is soft.     Tenderness: There is no abdominal tenderness. There is no right CVA tenderness or left CVA tenderness.  Skin:    General: Skin is warm and dry.  Neurological:     Mental Status: He is alert.     ED Results / Procedures / Treatments    Labs (all labs ordered are listed, but only abnormal results are displayed) Labs Reviewed  URINALYSIS, ROUTINE W REFLEX MICROSCOPIC - Abnormal; Notable for the following components:      Result Value   Color, Urine RED (*)    APPearance HAZY (*)    Glucose, UA >=500 (*)    Hgb urine dipstick MODERATE (*)    Protein, ur 100 (*)    All other components within normal limits  BASIC METABOLIC PANEL WITH GFR - Abnormal; Notable for the following components:   Glucose, Bld 103 (*)    Creatinine, Ser 1.75 (*)    Calcium  8.4 (*)    GFR, Estimated 37 (*)    All other components within normal limits  CBC - Abnormal; Notable for the following components:   WBC 3.3 (*)    RDW 16.0 (*)    Platelets 62 (*)    All other components within normal limits    EKG None  Radiology No results found.  Procedures Procedures    Medications Ordered in ED Medications - No data to display  ED Course/ Medical Decision Making/ A&P                                 Medical Decision Making Amount and/or Complexity of Data Reviewed Independent Historian:     Details: son Labs: ordered.    Details: Chronic thrombocytopenia CKD - stable   Patient presents with some difficulty urinating since yesterday.  Seems to have started after drinking large amounts of alcohol .  He denies any specific penile or testicular pain.  He is hypertensive here though asymptomatic from that.  No flank, back, abdominal pain.  I do not think a CT is needed.  He does have hematuria but no UTI though the hematuria seems to be chronic on chart review.  At this point, he is able to empty his bladder and while there is some residual, the most recent residual was only on 117, and I do not think this is enough to require a Foley catheter.  And he can follow-up with urology but seems to be clearing his bladder enough.  His chronic kidney disease is stable.  He is having some hypertension without symptoms, did not take his meds today  and will take them when he gets home.  He otherwise appears stable for discharge to follow-up with urology and PCP.        Final Clinical Impression(s) / ED Diagnoses Final diagnoses:  Hematuria, unspecified type  Hypertensive urgency    Rx / DC Orders ED Discharge Orders  None         Jerilynn Montenegro, MD 01/09/24 [phone]

## 2024-01-09 NOTE — ED Triage Notes (Addendum)
 Pt came in via POV d/t not having any decreased urine output since last night. States this morning it came out a little bit at a time & after drinking some water  it would come out "a little bit better." States is does burn when he urinates & hematuria noticed when a sample was given during triage. Denies abd pain or fevers, son at bedside reports ETOH use on board since yesterday.

## 2024-01-10 NOTE — Progress Notes (Signed)
 Carelink Summary Report / Loop Recorder

## 2024-01-13 DIAGNOSIS — N369 Urethral disorder, unspecified: Secondary | ICD-10-CM | POA: Diagnosis not present

## 2024-01-13 DIAGNOSIS — C689 Malignant neoplasm of urinary organ, unspecified: Secondary | ICD-10-CM | POA: Diagnosis not present

## 2024-01-13 DIAGNOSIS — R8289 Other abnormal findings on cytological and histological examination of urine: Secondary | ICD-10-CM | POA: Diagnosis not present

## 2024-01-13 DIAGNOSIS — R310 Gross hematuria: Secondary | ICD-10-CM | POA: Diagnosis not present

## 2024-01-27 ENCOUNTER — Telehealth: Payer: Self-pay

## 2024-01-28 DIAGNOSIS — N3000 Acute cystitis without hematuria: Secondary | ICD-10-CM | POA: Diagnosis not present

## 2024-01-28 DIAGNOSIS — R310 Gross hematuria: Secondary | ICD-10-CM | POA: Diagnosis not present

## 2024-01-28 DIAGNOSIS — C689 Malignant neoplasm of urinary organ, unspecified: Secondary | ICD-10-CM | POA: Diagnosis not present

## 2024-02-07 ENCOUNTER — Ambulatory Visit

## 2024-02-07 ENCOUNTER — Ambulatory Visit: Payer: Self-pay | Admitting: Cardiology

## 2024-02-07 DIAGNOSIS — I495 Sick sinus syndrome: Secondary | ICD-10-CM | POA: Diagnosis not present

## 2024-02-07 LAB — CUP PACEART REMOTE DEVICE CHECK
Date Time Interrogation Session: 20250629230336
Implantable Pulse Generator Implant Date: 20201014

## 2024-02-14 ENCOUNTER — Ambulatory Visit: Payer: Medicaid Other

## 2024-02-20 ENCOUNTER — Other Ambulatory Visit: Payer: Self-pay | Admitting: Nurse Practitioner

## 2024-02-20 DIAGNOSIS — I5032 Chronic diastolic (congestive) heart failure: Secondary | ICD-10-CM

## 2024-02-23 ENCOUNTER — Ambulatory Visit: Admitting: Podiatry

## 2024-02-23 NOTE — Progress Notes (Signed)
 Carelink Summary Report / Loop Recorder

## 2024-02-28 ENCOUNTER — Ambulatory Visit (INDEPENDENT_AMBULATORY_CARE_PROVIDER_SITE_OTHER): Admitting: Podiatry

## 2024-02-28 ENCOUNTER — Encounter: Payer: Self-pay | Admitting: Podiatry

## 2024-02-28 DIAGNOSIS — B351 Tinea unguium: Secondary | ICD-10-CM

## 2024-02-28 DIAGNOSIS — M79675 Pain in left toe(s): Secondary | ICD-10-CM | POA: Diagnosis not present

## 2024-02-28 DIAGNOSIS — N183 Chronic kidney disease, stage 3 unspecified: Secondary | ICD-10-CM | POA: Diagnosis not present

## 2024-02-28 DIAGNOSIS — I739 Peripheral vascular disease, unspecified: Secondary | ICD-10-CM

## 2024-02-28 DIAGNOSIS — M79674 Pain in right toe(s): Secondary | ICD-10-CM

## 2024-02-28 NOTE — Progress Notes (Signed)
 This patient returns to my office for at risk foot care.  This patient requires this care by a professional since this patient will be at risk due to having chronic kidney disease and coagulation defect.  Patient is taking eliquis.   This patient is unable to cut nails himself since the patient cannot reach his nails.These nails are painful walking and wearing shoes.  He presents to the office with his sister.  This patient presents for at risk foot care today.  General Appearance  Alert, conversant and in no acute stress.  Vascular  Dorsalis pedis and posterior tibial  pulses are weakly  palpable  bilaterally.  Capillary return is within normal limits  Bilaterally. Cold feet bilaterally. Absent digital hair  B/L.  Neurologic  Senn-Weinstein monofilament wire test within normal limits  bilaterally. Muscle power within normal limits bilaterally.  Nails Thick disfigured discolored nails with subungual debris  from hallux to fifth toes bilaterally. No evidence of bacterial infection or drainage bilaterally.  Orthopedic  No limitations of motion  feet .  No crepitus or effusions noted.  No bony pathology or digital deformities noted.  HAV  B/L.  Skin  normotropic skin with no porokeratosis noted bilaterally.  No signs of infections or ulcers noted.     Onychomycosis  Pain in right toes  Pain in left toes  Consent was obtained for treatment procedures.   Mechanical debridement of nails 1-5  bilaterally performed with a nail nipper.  Filed with dremel without incident.  Cauterized fourth toe left foot.  Use dremel tool only.   Return office visit     10  weeks                Told patient to return for periodic foot care and evaluation due to potential at risk complications.   Helane Gunther DPM

## 2024-03-03 ENCOUNTER — Other Ambulatory Visit: Payer: Self-pay

## 2024-03-03 DIAGNOSIS — L538 Other specified erythematous conditions: Secondary | ICD-10-CM | POA: Diagnosis not present

## 2024-03-03 DIAGNOSIS — L820 Inflamed seborrheic keratosis: Secondary | ICD-10-CM | POA: Diagnosis not present

## 2024-03-03 NOTE — Patient Outreach (Signed)
 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 I obtained verbal consent from Patient.  Visit completed with patient on the phone.  Background:   Past Medical History:  Diagnosis Date   Acute blood loss anemia 08/11/2020   Acute on chronic renal failure (HCC) 07/27/2012   Alcohol  abuse 01/19/2014   Arthritis    CAD (coronary artery disease)    a. Cath 08/2009: minimal nonobstructive disease (25% LAD). b. Lexiscan  nuc 6/215: EF 54%, no ischemia or infarction.   Chronic diastolic CHF (congestive heart failure) (HCC)    a. Echo 2013: EF 55-60%, mild AI, mild MR. b. Echo 5/15 with EF 60-65%, no WMA, mild LVH, mild AI.   CKD (chronic kidney disease) stage 3, GFR 30-59 ml/min (HCC) 07/26/2012   a. Felt likely due to HTN.   CVA (cerebral infarction) 07/28/2012   Fall 07/05/2023   Fracture of inferior pubic ramus with routine healing 07/05/2023   Glaucoma    Gout    H/O umbilical hernia repair    Headache(784.0)     WHEN MY BLOOD PRESSURE IS UP   Hiatal hernia 09/23/2022   History of acute renal failure    History of alcohol  abuse    HTN (hypertension)    Hyperkalemia 01/27/2023   Hypertension    a. Patent renal arteries 2005.   Noncompliance    OA (osteoarthritis)    Other and unspecified hyperlipidemia    Other ascites 09/23/2022   Poor circulation    Sinus bradycardia    SVT (supraventricular tachycardia) (HCC)    a. Possible AVNRT.  Got adenosine  from EMS in 2015 for SVT.  During 5/15 hospitalization had episode of short R-P tachycardia (possible AVNRT) terminated by adenosine .  b. 04/2016 - recurrent tachycardia (SVT vs. AVNRT), again terminated by Adenosine    Symptomatic bradycardia 02/02/2023   Syncope 01/27/2023   Visual  impairment    Weakness 09/23/2022    Assessment: Patient Reported Symptoms:  Cognitive Cognitive Status: Alert and oriented to person, place, and time Cognitive/Intellectual Conditions Management [RPT]: None reported or documented in medical history or problem list   Health Maintenance Behaviors: Annual physical exam, Healthy diet Healing Pattern: Average Health Facilitated by: Healthy diet  Neurological Neurological Review of Symptoms: No symptoms reported    HEENT HEENT Symptoms Reported: No symptoms reported      Cardiovascular Cardiovascular Symptoms Reported: No symptoms reported Does patient have uncontrolled Hypertension?: No Cardiovascular Management Strategies: Medication therapy, Routine screening Cardiovascular Self-Management Outcome: 4 (good)  Respiratory Respiratory Symptoms Reported: No symptoms reported    Endocrine Endocrine Symptoms Reported: No symptoms reported    Gastrointestinal Gastrointestinal Symptoms Reported: No symptoms reported      Genitourinary Genitourinary Symptoms Reported: Blood in urine Genitourinary Management Strategies:  (Routine Screening) Genitourinary Self-Management Outcome: 3 (uncertain) Genitourinary Comment: Patient with new CA diagnosis, follow up w/[facility] next appt scheduled 03/13/24  Integumentary Integumentary Symptoms Reported: Skin changes Additional Integumentary Details: mole on face Skin Management Strategies: Routine screening (patient had previous freezing of mole but it returned) Skin Self-Management Outcome: 3 (uncertain) Skin Comment: Scheduled dermatology today  Musculoskeletal     Falls in the past year?: Yes Number of falls in past year: 1 or less Was there an injury with Fall?: No Fall Risk Category  Calculator: 1 Patient Fall Risk Level: Low Fall Risk Patient at Risk for Falls Due to: History of fall(s), Impaired balance/gait, Impaired mobility Fall risk Follow up: Falls evaluation completed,  Education provided, Falls prevention discussed  Psychosocial Psychosocial Symptoms Reported: No symptoms reported            12/29/2023   12:26 PM  Depression screen PHQ 2/9  Decreased Interest 0  Down, Depressed, Hopeless 0  PHQ - 2 Score 0  Altered sleeping 0  Tired, decreased energy 0  Change in appetite 0  Feeling bad or failure about yourself  0  Trouble concentrating 0  Moving slowly or fidgety/restless 0  Suicidal thoughts 0  PHQ-9 Score 0  Difficult doing work/chores Not difficult at all    There were no vitals filed for this visit.  Medications Reviewed Today     Reviewed by [name], RN (Registered Nurse) on 03/03/24 at 1055  Med List Status: <None>   Medication Order Taking? Sig Documenting Provider Last Dose Status Informant  acetaminophen  (TYLENOL ) 500 MG tablet [id number] No Take 1,000 mg by mouth 2 (two) times daily as needed for mild pain or moderate pain (leg pain). [provider] Taking Active Family Member  albuterol  (VENTOLIN  HFA) 108 (90 Base) MCG/ACT inhaler [id number] No INHALE 1 TO 2 PUFFS INTO THE LUNGS EVERY 6 HOURS AS NEEDED FOR WHEEZING OR SHORTNESS OF [name] [name], NP Taking Active   carvedilol  (COREG ) 3.125 MG tablet [id number] No Take 1 tablet (3.125 mg total) by mouth 2 (two) times daily with a meal. [name], FNP Taking Expired 11/29/23 2359   [name]  10 MG TABS tablet [id number]  TAKE 1 TABLET(10 MG) BY MOUTH DAILY BEFORE [name] [name], FNP  Active   ferrous sulfate  (FEROSUL) 325 (65 FE) MG tablet [id number]  Take 1 tablet (325 mg total) by mouth daily. [name], FNP  Active   finasteride  (PROSCAR ) 5 MG tablet [id number] No Take 5 mg by mouth daily. [provider] Taking Active Family Member  fluticasone  (FLONASE ) 50 MCG/ACT nasal spray [id number] No Place 2 sprays into both nostrils daily. Alva Larraine FALCON, PA-C Taking Active   furosemide  (LASIX ) 20 MG tablet [id number] No Take 1 tablet (20 mg total)  by mouth as needed. Thukkani, Arun K, MD Taking Active   LUMIGAN 0.01 % SOLN [id number] No Place 1 drop into both eyes at bedtime. [provider] Taking Active Family Member           Med Note (WHITE, DONETA GORMAN [place] Jan 27, 2023  9:42 PM) Extra drop placed today, due to blurry vision  meclizine  (ANTIVERT ) 25 MG tablet [id number] No Take 1 tablet (25 mg total) by mouth 3 (three) times daily as needed for dizziness. Kehrli, Kelsey F, PA-C Taking Active   SIMBRINZA  1-0.2 % SUSP [id number] No Follow up with your PCP on when to restart this medication.  Patient taking differently: Place 1 drop into both eyes 3 (three) times daily.   Briana Elgin LABOR, MD Taking Active Family Member  Vitamin D , Ergocalciferol , (DRISDOL) 1.25 MG (50000 UNIT) CAPS capsule [id number] No Take 50,000 Units by mouth once a week. [provider] Taking Active   vitamin D3 (CHOLECALCIFEROL) 25 MCG tablet [id number] No Take 1,000 Units by mouth daily. [provider] Taking Active Family Member  Med List Note Isabel DONETA GORMAN Bishop 01/27/23 2146): Sister, [place], assists patient with medication  Recommendation:   PCP Follow-up  Follow Up Plan:   Telephone follow up appointment date/time:  Monday, August 25 at 1:15 PM  Clayborne Ly RN BSN CCM [place]  [facility], [facility] Nurse Care Coordinator  Direct Dial: [phone] Website: [email or web address]

## 2024-03-06 ENCOUNTER — Other Ambulatory Visit: Payer: Self-pay

## 2024-03-06 ENCOUNTER — Emergency Department (HOSPITAL_COMMUNITY)
Admission: EM | Admit: 2024-03-06 | Discharge: 2024-03-06 | Disposition: A | Discharge status: Home / Self Care | Attending: Emergency Medicine | Admitting: Emergency Medicine

## 2024-03-06 ENCOUNTER — Emergency Department (HOSPITAL_COMMUNITY)
Admission: EM | Admit: 2024-03-06 | Discharge: 2024-03-06 | Discharge status: Against Medical Advice | Attending: Emergency Medicine | Admitting: Emergency Medicine

## 2024-03-06 ENCOUNTER — Emergency Department (HOSPITAL_COMMUNITY)

## 2024-03-06 ENCOUNTER — Encounter (HOSPITAL_COMMUNITY): Payer: Self-pay

## 2024-03-06 DIAGNOSIS — Z5321 Procedure and treatment not carried out due to patient leaving prior to being seen by health care provider: Secondary | ICD-10-CM | POA: Diagnosis not present

## 2024-03-06 DIAGNOSIS — R339 Retention of urine, unspecified: Secondary | ICD-10-CM | POA: Insufficient documentation

## 2024-03-06 DIAGNOSIS — R000 Tachycardia, unspecified: Secondary | ICD-10-CM | POA: Diagnosis not present

## 2024-03-06 DIAGNOSIS — I509 Heart failure, unspecified: Secondary | ICD-10-CM | POA: Insufficient documentation

## 2024-03-06 DIAGNOSIS — R140 Abdominal distension (gaseous): Secondary | ICD-10-CM | POA: Diagnosis not present

## 2024-03-06 DIAGNOSIS — N189 Chronic kidney disease, unspecified: Secondary | ICD-10-CM | POA: Diagnosis not present

## 2024-03-06 DIAGNOSIS — R109 Unspecified abdominal pain: Secondary | ICD-10-CM | POA: Diagnosis not present

## 2024-03-06 DIAGNOSIS — R319 Hematuria, unspecified: Secondary | ICD-10-CM | POA: Diagnosis not present

## 2024-03-06 DIAGNOSIS — R161 Splenomegaly, not elsewhere classified: Secondary | ICD-10-CM | POA: Diagnosis not present

## 2024-03-06 DIAGNOSIS — K7689 Other specified diseases of liver: Secondary | ICD-10-CM | POA: Diagnosis not present

## 2024-03-06 DIAGNOSIS — N281 Cyst of kidney, acquired: Secondary | ICD-10-CM | POA: Diagnosis not present

## 2024-03-06 LAB — CBC WITH DIFFERENTIAL/PLATELET
Abs Immature Granulocytes: 0.01 K/uL (ref 0.00–0.07)
Abs Immature Granulocytes: 0.02 K/uL (ref 0.00–0.07)
Basophils Absolute: 0 K/uL (ref 0.0–0.1)
Basophils Absolute: 0 K/uL (ref 0.0–0.1)
Basophils Relative: 0 %
Basophils Relative: 0 %
Eosinophils Absolute: 0 K/uL (ref 0.0–0.5)
Eosinophils Absolute: 0 K/uL (ref 0.0–0.5)
Eosinophils Relative: 1 %
Eosinophils Relative: 1 %
HCT: 48.8 % (ref 39.0–52.0)
HCT: 50.3 % (ref 39.0–52.0)
Hemoglobin: 15.5 g/dL (ref 13.0–17.0)
Hemoglobin: 16 g/dL (ref 13.0–17.0)
Immature Granulocytes: 0 %
Immature Granulocytes: 0 %
Lymphocytes Relative: 20 %
Lymphocytes Relative: 18 %
Lymphs Abs: 0.9 K/uL (ref 0.7–4.0)
Lymphs Abs: 0.8 K/uL (ref 0.7–4.0)
MCH: 30.9 pg (ref 26.0–34.0)
MCH: 30.9 pg (ref 26.0–34.0)
MCHC: 31.8 g/dL (ref 30.0–36.0)
MCHC: 31.8 g/dL (ref 30.0–36.0)
MCV: 97.2 fL (ref 80.0–100.0)
MCV: 97.1 fL (ref 80.0–100.0)
Monocytes Absolute: 0.3 K/uL (ref 0.1–1.0)
Monocytes Absolute: 0.5 K/uL (ref 0.1–1.0)
Monocytes Relative: 7 %
Monocytes Relative: 11 %
Neutro Abs: 3.3 K/uL (ref 1.7–7.7)
Neutro Abs: 3.2 K/uL (ref 1.7–7.7)
Neutrophils Relative %: 72 %
Neutrophils Relative %: 70 %
Platelets: 75 K/uL — ABNORMAL LOW (ref 150–400)
Platelets: 69 K/uL — ABNORMAL LOW (ref 150–400)
RBC: 5.02 MIL/uL (ref 4.22–5.81)
RBC: 5.18 MIL/uL (ref 4.22–5.81)
RDW: 18.1 % — ABNORMAL HIGH (ref 11.5–15.5)
RDW: 18.4 % — ABNORMAL HIGH (ref 11.5–15.5)
WBC: 4.6 K/uL (ref 4.0–10.5)
WBC: 4.6 K/uL (ref 4.0–10.5)
nRBC: 0 % (ref 0.0–0.2)
nRBC: 0 % (ref 0.0–0.2)

## 2024-03-06 LAB — URINALYSIS, ROUTINE W REFLEX MICROSCOPIC
Glucose, UA: >=500 mg/dL — AB
Ketones, ur: NEGATIVE mg/dL
Nitrite: POSITIVE — AB
Protein, ur: >300 mg/dL — AB
Specific Gravity, Urine: 1.02 (ref 1.005–1.030)
pH: 5 (ref 5.0–8.0)

## 2024-03-06 LAB — COMPREHENSIVE METABOLIC PANEL WITH GFR
ALT: 23 U/L (ref 0–44)
ALT: 23 U/L (ref 0–44)
AST: 38 U/L (ref 15–41)
AST: 35 U/L (ref 15–41)
Albumin: 3 g/dL — ABNORMAL LOW (ref 3.5–5.0)
Albumin: 3.2 g/dL — ABNORMAL LOW (ref 3.5–5.0)
Alkaline Phosphatase: 76 U/L (ref 38–126)
Alkaline Phosphatase: 83 U/L (ref 38–126)
Anion gap: 12 (ref 5–15)
Anion gap: 11 (ref 5–15)
BUN: 13 mg/dL (ref 8–23)
BUN: 14 mg/dL (ref 8–23)
CO2: 20 mmol/L — ABNORMAL LOW (ref 22–32)
CO2: 19 mmol/L — ABNORMAL LOW (ref 22–32)
Calcium: 8.4 mg/dL — ABNORMAL LOW (ref 8.9–10.3)
Calcium: 8.4 mg/dL — ABNORMAL LOW (ref 8.9–10.3)
Chloride: 105 mmol/L (ref 98–111)
Chloride: 107 mmol/L (ref 98–111)
Creatinine, Ser: 2.06 mg/dL — ABNORMAL HIGH (ref 0.61–1.24)
Creatinine, Ser: 1.78 mg/dL — ABNORMAL HIGH (ref 0.61–1.24)
GFR, Estimated: 31 mL/min — ABNORMAL LOW (ref >60)
GFR, Estimated: 37 mL/min — ABNORMAL LOW (ref >60)
Glucose, Bld: 126 mg/dL — ABNORMAL HIGH (ref 70–99)
Glucose, Bld: 120 mg/dL — ABNORMAL HIGH (ref 70–99)
Potassium: 4 mmol/L (ref 3.5–5.1)
Potassium: 4.2 mmol/L (ref 3.5–5.1)
Sodium: 137 mmol/L (ref 135–145)
Sodium: 137 mmol/L (ref 135–145)
Total Bilirubin: 1.4 mg/dL — ABNORMAL HIGH (ref 0.0–1.2)
Total Bilirubin: 1.5 mg/dL — ABNORMAL HIGH (ref 0.0–1.2)
Total Protein: 7.1 g/dL (ref 6.5–8.1)
Total Protein: 7.6 g/dL (ref 6.5–8.1)

## 2024-03-06 LAB — URINALYSIS, MICROSCOPIC (REFLEX): RBC / HPF: >50 RBC/hpf (ref 0–5)

## 2024-03-06 LAB — URINALYSIS, W/ REFLEX TO CULTURE (INFECTION SUSPECTED)
Bilirubin Urine: NEGATIVE
Glucose, UA: >=500 mg/dL — AB
Ketones, ur: NEGATIVE mg/dL
Leukocytes,Ua: NEGATIVE
Nitrite: NEGATIVE
Protein, ur: 30 mg/dL — AB
RBC / HPF: >50 RBC/hpf (ref 0–5)
Specific Gravity, Urine: 1.002 — ABNORMAL LOW (ref 1.005–1.030)
pH: 5 (ref 5.0–8.0)

## 2024-03-06 LAB — TROPONIN I (HIGH SENSITIVITY)
Troponin I (High Sensitivity): 16 ng/L (ref <18)
Troponin I (High Sensitivity): 17 ng/L (ref <18)

## 2024-03-06 LAB — LIPASE, BLOOD: Lipase: 31 U/L (ref 11–51)

## 2024-03-06 NOTE — ED Notes (Signed)
 Patient left.

## 2024-03-06 NOTE — ED Provider Notes (Signed)
 [place] EMERGENCY DEPARTMENT AT [facility] Provider Note   CSN: [id number] Arrival date & time: 03/06/24  9267     Patient presents with: Abdominal Pain   Trevor Murphy is a 89 y.o. male.  With a past history of heart failure, CKD, status post ventral hernia repair who presents to the ED for abdominal pain.  Patient has been unable to urinate today.  Reporting 10 out of 10 abdominal pain.  No nausea vomiting change in bowel habits.  Denies chest pain, shortness of breath fevers and chills.    Abdominal Pain      Prior to Admission medications   Medication Sig Start Date End Date Taking? Authorizing Provider  acetaminophen  (TYLENOL ) 500 MG tablet Take 1,000 mg by mouth 2 (two) times daily as needed for mild pain or moderate pain (leg pain).    [provider]  albuterol  (VENTOLIN  HFA) 108 (90 Base) MCG/ACT inhaler INHALE 1 TO 2 PUFFS INTO THE LUNGS EVERY 6 HOURS AS NEEDED FOR WHEEZING OR SHORTNESS OF BREATH 03/29/23   [name], NP  carvedilol  (COREG ) 3.125 MG tablet Take 1 tablet (3.125 mg total) by mouth 2 (two) times daily with a meal. 08/31/23 11/29/23  [name], FNP  [name]  10 MG TABS tablet TAKE 1 TABLET(10 MG) BY MOUTH DAILY BEFORE BREAKFAST 02/21/24   [name], FNP  ferrous sulfate  (FEROSUL) 325 (65 FE) MG tablet Take 1 tablet (325 mg total) by mouth daily. 01/04/24   [name], FNP  finasteride  (PROSCAR ) 5 MG tablet Take 5 mg by mouth daily. 03/09/22   [provider]  fluticasone  (FLONASE ) 50 MCG/ACT nasal spray Place 2 sprays into both nostrils daily. 11/04/23   Alva Larraine FALCON, PA-C  furosemide  (LASIX ) 20 MG tablet Take 1 tablet (20 mg total) by mouth as needed. 07/21/23   Thukkani, Arun K, MD  LUMIGAN 0.01 % SOLN Place 1 drop into both eyes at bedtime. 08/28/19   [provider]  meclizine  (ANTIVERT ) 25 MG tablet Take 1 tablet (25 mg total) by mouth 3 (three) times daily as needed for dizziness. 11/04/23   Alva Larraine FALCON,  PA-C  SIMBRINZA  1-0.2 % SUSP Follow up with your PCP on when to restart this medication. Patient taking differently: Place 1 drop into both eyes 3 (three) times daily. 06/09/22   Briana Elgin LABOR, MD  Vitamin D , Ergocalciferol , (DRISDOL) 1.25 MG (50000 UNIT) CAPS capsule Take 50,000 Units by mouth once a week. 07/16/23   [provider]  vitamin D3 (CHOLECALCIFEROL) 25 MCG tablet Take 1,000 Units by mouth daily.    [provider]    Allergies: Patient has no known allergies.    Review of Systems  Gastrointestinal:  Positive for abdominal pain.    Updated Vital Signs BP (!) 137/90   Pulse (!) 114   Temp 97.6 F (36.4 C) (Oral)   Resp 18   Ht 5' 10 (1.778 m)   Wt 65 kg   SpO2 98%   BMI 20.56 kg/m   Physical Exam Vitals and nursing note reviewed.  HENT:     Head: Normocephalic and atraumatic.  Eyes:     Pupils: Pupils are equal, round, and reactive to light.  Cardiovascular:     Rate and Rhythm: Normal rate and regular rhythm.  Pulmonary:     Effort: Pulmonary effort is normal.     Breath sounds: Normal breath sounds.  Abdominal:     General: There is distension.  Palpations: Abdomen is soft. There is no fluid wave.     Tenderness: There is abdominal tenderness in the suprapubic area.  Skin:    General: Skin is warm and dry.  Neurological:     Mental Status: He is alert.  Psychiatric:        Mood and Affect: Mood normal.     (all labs ordered are listed, but only abnormal results are displayed) Labs Reviewed  COMPREHENSIVE METABOLIC PANEL WITH GFR - Abnormal; Notable for the following components:      Result Value   CO2 20 (*)    Glucose, Bld 126 (*)    Creatinine, Ser 2.06 (*)    Calcium  8.4 (*)    Albumin  3.0 (*)    Total Bilirubin 1.4 (*)    GFR, Estimated 31 (*)    All other components within normal limits  CBC WITH DIFFERENTIAL/PLATELET - Abnormal; Notable for the following components:   RDW 18.1 (*)    Platelets 75 (*)    All  other components within normal limits  URINALYSIS, W/ REFLEX TO CULTURE (INFECTION SUSPECTED) - Abnormal; Notable for the following components:   APPearance HAZY (*)    Specific Gravity, Urine 1.002 (*)    Glucose, UA >=500 (*)    Hgb urine dipstick MODERATE (*)    Protein, ur 30 (*)    Bacteria, UA RARE (*)    All other components within normal limits  URINE CULTURE  LIPASE, BLOOD  TROPONIN I (HIGH SENSITIVITY)  TROPONIN I (HIGH SENSITIVITY)    EKG: EKG Interpretation Date/Time:  Monday March 06 2024 07:49:57 EDT Ventricular Rate:  123 PR Interval:  58 QRS Duration:  129 QT Interval:  361 QTC Calculation: 517 R Axis:   -55  Text Interpretation: Sinus tachycardia Ventricular premature complex RBBB and LAFB ST depr, consider ischemia, inferior leads Minimal ST elevation, lateral leads Confirmed by Pamella Sharper [phone]) on 03/06/2024 9:01:13 AM  Radiology: CT ABDOMEN PELVIS WO CONTRAST Result Date: 03/06/2024 CLINICAL DATA:  Status post remote hernia repair EXAM: CT ABDOMEN AND PELVIS WITHOUT CONTRAST TECHNIQUE: Multidetector CT imaging of the abdomen and pelvis was performed following the standard protocol without IV contrast. RADIATION DOSE REDUCTION: This exam was performed according to the departmental dose-optimization program which includes automated exposure control, adjustment of the mA and/or kV according to patient size and/or use of iterative reconstruction technique. COMPARISON:  X-ray 01/27/2023. FINDINGS: Lower chest: There is some linear opacity along bases likely scar or atelectasis. Some breathing motion. Heart is slightly enlarged. No pericardial effusion. Patulous esophagus with small hiatal hernia suggested. There are several varices identified along the course of the lower mediastinum and esophagus. This was seen previously. Gynecomastia noted. Hepatobiliary: Slightly nodular contour to liver. Gallbladder is distended with layering stones. Of note there is poor  visualization of the main portal vein. Please correlate for portal vein abnormality. Contrast study may be of use as clinically appropriate. Pancreas: Global atrophy of the pancreas.  Similar previous. Spleen: Spleen is slightly enlarged with a cephalocaudal length of 13.4 cm. Adrenals/Urinary Tract: Adrenal glands are preserved. Multiple bilateral renal cysts are identified which appear relatively simple and unchanged from previous, Bosniak 1 lesions. A calcification in the right kidney on coronal series 6, image 40 identified. Possibly a punctate stone. No ureteral stones. Bladder is contracted with a Foley catheter. Stomach/Bowel: Large bowel has a normal course and caliber. Scattered colonic stool. Redundant course of the sigmoid colon. Surgical changes along loops of bowel in the  left upper quadrant. No small bowel dilatation. Stomach is relatively collapsed. Vascular/Lymphatic: Scattered vascular calcifications. Normal caliber aorta and IVC. Reproductive: Enlarged prostate. Other: Anasarca. No ascites. Small midline anterior abdominal fat containing hernia just above the umbilicus. There is fluid tracking along the right femoral hernia as well, this is been seen on prior examinations Musculoskeletal: Advanced multifocal degenerative changes along the spine and pelvis. Slight curvature of the spine. IMPRESSION: Slightly nodular contour to liver with caliber of portal vein branches on this noncontrast study and there are numerous varices extending into the mediastinum. Please correlate for portal vein flow and a contrast CT may be of some benefit to further define these changes. Splenomegaly is also noted with mesenteric stranding. No bowel obstruction.  No free air or free fluid. Punctate nonobstructing right-sided renal stone. Bilateral Bosniak 1 renal cysts as seen previous. Foley catheter in the urinary bladder.  Enlarged prostate. Gallstones. Please correlate for further evidence of acute gallbladder  pathology. Electronically Signed   By: Ranell Bring M.D.   On: 03/06/2024 10:48     Procedures   Medications Ordered in the ED - No data to display  Clinical Course as of 03/06/24 1130  Mon Mar 06, 2024  9053 Patient reports improvement in his discomfort after placement of Foley catheter.  Laboratory workup shows no leukocytosis.  Creatinine slightly up from previous but no severe AKI.  Troponin of 16 not consistent with ACS.  Awaiting UA.  Still having some tachycardia with abdominal tenderness.  Will obtain CT abdomen pelvis [MP]  1128 UA shows hematuria.  Will send for culture.  Will hold off on antibiotics for now.  CT abdomen pelvis shows nonobstructive stone.  Findings consistent with known varices and liver disease.  Will discharge patient with Foley in place and instruction for urology follow-up for spontaneous voiding trial.  He tells me he has seen [facility] in the past.  Patient remains tachycardic in the 110s but does have a history of tachybradycardia syndrome.  Otherwise stable for discharge. [MP]    Clinical Course User Index [MP] Pamella Ozell LABOR, DO                                 Medical Decision Making 89 year old male with history above.  Presenting for abdominal pain.  Reported urinary retention today.  Bladder scan reveals over 400 mL.  Some bladder distention and tenderness on my exam.  Afebrile hypertensive.    Differential diagnosis includes: Acute intra-abdominal infectious/inflammatory process such as appendicitis, diverticulitis, pancreatitis and cholecystitis Urinary tract infection Bladder outlet obstruction due to BPH Atypical presentation for pneumonia Viral gastroenteritis  Will obtain laboratory workup including CBC with differential, metabolic panel, lipase and urinalysis.  Will place Foley catheter.  If there are significant laboratory abnormalities and pain is not resolved after Foley catheter will consider CT abdomen pelvis at that  time  Amount and/or Complexity of Data Reviewed Labs: ordered. Radiology: ordered.        Final diagnoses:  Urinary retention    ED Discharge Orders     None          Pamella Ozell LABOR, DO 03/06/24 1130

## 2024-03-06 NOTE — Discharge Instructions (Addendum)
 You were seen in the emerged part for urinary retention We placed a Foley catheter and left in place You will need to follow-up with [facility] and be seen in the office before the catheter is removed.  Call the number listed above to make an appointment to be seen Please see the attached information on how to care for your Foley catheter bag Return to the emergency department for issues with the Foley, severe pain or any other concerns

## 2024-03-06 NOTE — ED Provider Triage Note (Signed)
 Emergency Medicine Provider Triage Evaluation Note  AQUARIUS LATOUCHE , a 89 y.o. male  was evaluated in triage.  Pt complains of abdominal tightness, not known how to empty the Foley bag which was placed earlier today.  Was seen earlier for abdominal pain and urinary retention.  Would like to be checked again for his abdominal distention..  Review of Systems  Positive: As above Negative: As above  Physical Exam  BP (!) 165/99 (BP Location: Left Arm)   Pulse 79   Temp 97.8 F (36.6 C)   Resp 18   SpO2 100%  Gen:   Awake, no distress   Resp:  Normal effort  MSK:   Moves extremities without difficulty  Other:    Medical Decision Making  Medically screening exam initiated at 9:27 PM.  Appropriate orders placed.  [name] was informed that the remainder of the evaluation will be completed by another provider, this initial triage assessment does not replace that evaluation, and the importance of remaining in the ED until their evaluation is complete.    Hildegard Loge, PA-C 03/06/24 2128

## 2024-03-06 NOTE — ED Triage Notes (Signed)
 Pt was seen here earlier today and had foley changed, did not have discharge teaching and does not know how to empty the foley bag, pt also continues to have abd pain.

## 2024-03-06 NOTE — ED Triage Notes (Incomplete)
 Pt to ED with c/o generalized abdominal pain x several hours.

## 2024-03-07 LAB — URINE CULTURE: Culture: NO GROWTH

## 2024-03-07 NOTE — Patient Instructions (Signed)
 Visit Information  Thank you for taking time to visit with me today. Please don't hesitate to contact me if I can be of assistance to you before our next scheduled appointment.  Our next appointment is by telephone on Tuesday, August 5 at 11:00 AM Please call the care guide team at [phone] if you need to cancel or reschedule your appointment.   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       Problems:  Chronic Disease Management support and education needs related to Urethral Urothelial Carcinoma (new diagnosis)  Goal: Over the next 90 days the Patient will continue to work with RN Care Manager and/or Social Worker to address care management and care coordination needs related to Urethral Urothelial Carcinoma as evidenced by adherence to care management team scheduled appointments      Interventions:   Oncology: Assessment of understanding of oncology diagnosis: Urethral Urothelial Carcinoma  Assessed patient understanding of cancer diagnosis and recommended treatment plan Reviewed upcoming provider appointments and treatment appointments with Dr. Alvaro with [facility] on 03/13/24 at 3:30 PM  Pain Interventions: Pain assessment performed Medications reviewed Advised patient to report to care team affect of pain on daily activities  Patient Self-Care Activities:  Attend all scheduled provider appointments Call pharmacy for medication refills 3-7 days in advance of running out of medications Call provider office for new concerns or questions  Take medications as prescribed   Work with the social worker to address care coordination needs and will continue to work with the clinical team to address health care and disease management related needs  Plan:  Follow up with provider re: new Cancer diagnosis as directed  Telephone follow up appointment with care  management team member scheduled for:  Tuesday, August 5 at 11:00 AM             Please call 1-800-273-TALK (toll free, 24 hour hotline) if you are experiencing a Mental Health or Behavioral Health Crisis or need someone to talk to.  The patient verbalized understanding of instructions, educational materials, and care plan provided today and DECLINED offer to receive copy of patient instructions, educational materials, and care plan.   Clayborne Ly RN BSN CCM [place]  [facility], [facility] Health Nurse Care Coordinator  Direct Dial: [phone] Website: [email or web address]

## 2024-03-09 ENCOUNTER — Ambulatory Visit (INDEPENDENT_AMBULATORY_CARE_PROVIDER_SITE_OTHER)

## 2024-03-09 DIAGNOSIS — I495 Sick sinus syndrome: Secondary | ICD-10-CM | POA: Diagnosis not present

## 2024-03-09 LAB — CUP PACEART REMOTE DEVICE CHECK
Date Time Interrogation Session: 20250730230629
Implantable Pulse Generator Implant Date: 20201014

## 2024-03-10 ENCOUNTER — Ambulatory Visit: Payer: Self-pay | Admitting: Cardiology

## 2024-03-13 DIAGNOSIS — R338 Other retention of urine: Secondary | ICD-10-CM | POA: Diagnosis not present

## 2024-03-13 DIAGNOSIS — R310 Gross hematuria: Secondary | ICD-10-CM | POA: Diagnosis not present

## 2024-03-13 DIAGNOSIS — C678 Malignant neoplasm of overlapping sites of bladder: Secondary | ICD-10-CM | POA: Diagnosis not present

## 2024-03-13 DIAGNOSIS — R8271 Bacteriuria: Secondary | ICD-10-CM | POA: Diagnosis not present

## 2024-03-13 DIAGNOSIS — N281 Cyst of kidney, acquired: Secondary | ICD-10-CM | POA: Diagnosis not present

## 2024-03-14 ENCOUNTER — Other Ambulatory Visit

## 2024-03-14 NOTE — Patient Instructions (Signed)
 Visit Information  Thank you for taking time to visit with me today. Please don't hesitate to contact me if I can be of assistance to you before our next scheduled appointment.  Your next care management appointment is by telephone on Monday, August 15 at 1:15 PM  Please call the care guide team at [phone] if you need to cancel, schedule, or reschedule an appointment.   Please call 1-800-273-TALK (toll free, 24 hour hotline) if you are experiencing a Mental Health or Behavioral Health Crisis or need someone to talk to.  Clayborne Ly RN BSN CCM [place]  [facility], [facility] Health Nurse Care Coordinator  Direct Dial: [phone] Website: [email or web address]

## 2024-03-14 NOTE — Patient Outreach (Signed)
 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  I obtained verbal consent from Patient.  Visit completed with patient  on the phone.  Background:   Past Medical History:  Diagnosis Date   Acute blood loss anemia 08/11/2020   Acute on chronic renal failure (HCC) 07/27/2012   Alcohol  abuse 01/19/2014   Arthritis    CAD (coronary artery disease)    a. Cath 08/2009: minimal nonobstructive disease (25% LAD). b. Lexiscan  nuc 6/215: EF 54%, no ischemia or infarction.   Chronic diastolic CHF (congestive heart failure) (HCC)    a. Echo 2013: EF 55-60%, mild AI, mild MR. b. Echo 5/15 with EF 60-65%, no WMA, mild LVH, mild AI.   CKD (chronic kidney disease) stage 3, GFR 30-59 ml/min (HCC) 07/26/2012   a. Felt likely due to HTN.   CVA (cerebral infarction) 07/28/2012   Fall 07/05/2023   Fracture of inferior pubic ramus with routine healing 07/05/2023   Glaucoma    Gout    H/O umbilical hernia repair    Headache(784.0)     WHEN MY BLOOD PRESSURE IS UP   Hiatal hernia 09/23/2022   History of acute renal failure    History of alcohol  abuse    HTN (hypertension)    Hyperkalemia 01/27/2023   Hypertension    a. Patent renal arteries 2005.   Noncompliance    OA (osteoarthritis)    Other and unspecified hyperlipidemia    Other ascites 09/23/2022   Poor circulation    Sinus bradycardia    SVT (supraventricular tachycardia) (HCC)    a. Possible AVNRT.  Got adenosine  from EMS in 2015 for SVT.  During 5/15 hospitalization had episode of short R-P tachycardia (possible AVNRT) terminated by adenosine .  b. 04/2016 - recurrent tachycardia (SVT vs. AVNRT), again terminated by Adenosine    Symptomatic bradycardia  02/02/2023   Syncope 01/27/2023   Visual impairment    Weakness 09/23/2022    Assessment: Patient Reported Symptoms:  Cognitive Cognitive Status: No symptoms reported (spoke with sister Bonney) Cognitive/Intellectual Conditions Management [RPT]: None reported or documented in medical history or problem list   Health Maintenance Behaviors: Annual physical exam  Neurological Neurological Review of Symptoms: No symptoms reported    HEENT HEENT Symptoms Reported: No symptoms reported      Cardiovascular Cardiovascular Symptoms Reported: No symptoms reported    Respiratory Respiratory Symptoms Reported: No symptoms reported    Endocrine Endocrine Symptoms Reported: No symptoms reported    Gastrointestinal Gastrointestinal Symptoms Reported: Other Other Gastrointestinal Symptoms: Ascites Gastrointestinal Management Strategies: Adequate rest (Routine Screening) Gastrointestinal Self-Management Outcome: 3 (uncertain)    Genitourinary Genitourinary Symptoms Reported: Blood in urine Genitourinary Management Strategies: Catheter, indwelling Indwelling Catheter Inserted: 03/06/24 Genitourinary Self-Management Outcome: 3 (uncertain)  Integumentary Integumentary Symptoms Reported: Not assessed    Musculoskeletal Musculoskelatal Symptoms Reviewed: Difficulty walking, Limited mobility, Weakness Musculoskeletal Management Strategies: Adequate rest, Medical device, Routine screening Musculoskeletal Self-Management Outcome: 3 (uncertain)      Psychosocial Psychosocial Symptoms Reported: No symptoms reported     Quality of Family Relationships: helpful, involved, supportive Do you feel physically threatened by others?: No      12/29/2023   12:26 PM  Depression screen PHQ 2/9  Decreased Interest 0  Down, Depressed, Hopeless 0  PHQ - 2 Score 0  Altered sleeping 0  Tired, decreased energy 0  Change in appetite 0  Feeling bad or failure about yourself  0  Trouble concentrating 0   Moving slowly or fidgety/restless 0  Suicidal thoughts 0  PHQ-9 Score 0  Difficult doing work/chores Not difficult at all    There were no vitals filed for this visit.  Medications Reviewed Today     Reviewed by [name], RN (Registered Nurse) on 03/14/24 at 1119  Med List Status: <None>   Medication Order Taking? Sig Documenting Provider Last Dose Status Informant  acetaminophen  (TYLENOL ) 500 MG tablet [id number]  Take 1,000 mg by mouth 2 (two) times daily as needed for mild pain or moderate pain (leg pain). [provider]  Active Family Member  albuterol  (VENTOLIN  HFA) 108 [id number] Base) MCG/ACT inhaler [id number]  INHALE 1 TO 2 PUFFS INTO THE LUNGS EVERY 6 HOURS AS NEEDED FOR WHEEZING OR SHORTNESS OF [name] [name], NP  Active   carvedilol  (COREG ) 3.125 MG tablet [id number]  Take 1 tablet (3.125 mg total) by mouth 2 (two) times daily with a meal. [name], FNP  Expired 11/29/23 2359   [name]  10 MG TABS tablet [id number]  TAKE 1 TABLET(10 MG) BY MOUTH DAILY BEFORE [name] [name], FNP  Active   ferrous sulfate  (FEROSUL) 325 (65 FE) MG tablet [id number]  Take 1 tablet (325 mg total) by mouth daily. [name], FNP  Active   finasteride  (PROSCAR ) 5 MG tablet [id number]  Take 5 mg by mouth daily. [provider]  Active Family Member  fluticasone  (FLONASE ) 50 MCG/ACT nasal spray [id number]  Place 2 sprays into both nostrils daily. Kehrli, Kelsey F, PA-C  Active   furosemide  (LASIX ) 20 MG tablet [id number]  Take 1 tablet (20 mg total) by mouth as needed. Thukkani, Arun K, MD  Active   LUMIGAN 0.01 % SOLN [id number]  Place 1 drop into both eyes at bedtime. [provider]  Active Family Member           Med Note [place], DONETA GORMAN [place] Jan 27, 2023  9:42 PM) Extra drop placed today, due to blurry vision  meclizine  (ANTIVERT ) 25 MG tablet [id number]  Take 1 tablet (25 mg total) by mouth 3 (three) times daily as needed for dizziness. Alva Larraine FALCON, PA-C   Active   SIMBRINZA  1-0.2 % SUSP [id number]  Follow up with your PCP on when to restart this medication.  Patient taking differently: Place 1 drop into both eyes 3 (three) times daily.   Briana Elgin LABOR, MD  Active Family Member  Vitamin D , Ergocalciferol , (DRISDOL) 1.25 MG (50000 UNIT) CAPS capsule [id number]  Take 50,000 Units by mouth once a week. [provider]  Active   vitamin D3 (CHOLECALCIFEROL) 25 MCG tablet [id number]  Take 1,000 Units by mouth daily. [provider]  Active Family Member  Med List Note Isabel DONETA GORMAN [place] 01/27/23 2146): Sister, [place], assists patient with medication            Recommendation:   PCP Follow-up Specialty provider follow-up with [facility] as directed   Follow Up Plan:   Telephone follow up appointment date/time:  Monday, August 25 at 1:15 PM  Clayborne Morgan RN BSN CCM [place]  [facility], [facility] Health Nurse Care Coordinator  Direct Dial: [phone] Website: [email or web address]

## 2024-03-17 ENCOUNTER — Ambulatory Visit: Payer: Medicaid Other

## 2024-03-20 ENCOUNTER — Emergency Department (HOSPITAL_COMMUNITY)

## 2024-03-20 ENCOUNTER — Encounter (HOSPITAL_COMMUNITY): Payer: Self-pay

## 2024-03-20 ENCOUNTER — Other Ambulatory Visit: Payer: Self-pay

## 2024-03-20 ENCOUNTER — Emergency Department (HOSPITAL_COMMUNITY)
Admission: EM | Admit: 2024-03-20 | Discharge: 2024-03-20 | Disposition: A | Discharge status: Home / Self Care | Source: Ambulatory Visit | Attending: Emergency Medicine | Admitting: Emergency Medicine

## 2024-03-20 DIAGNOSIS — I2510 Atherosclerotic heart disease of native coronary artery without angina pectoris: Secondary | ICD-10-CM | POA: Diagnosis not present

## 2024-03-20 DIAGNOSIS — N3001 Acute cystitis with hematuria: Secondary | ICD-10-CM | POA: Diagnosis not present

## 2024-03-20 DIAGNOSIS — N1832 Chronic kidney disease, stage 3b: Secondary | ICD-10-CM | POA: Diagnosis not present

## 2024-03-20 DIAGNOSIS — R140 Abdominal distension (gaseous): Secondary | ICD-10-CM | POA: Diagnosis present

## 2024-03-20 DIAGNOSIS — I5032 Chronic diastolic (congestive) heart failure: Secondary | ICD-10-CM | POA: Insufficient documentation

## 2024-03-20 DIAGNOSIS — I130 Hypertensive heart and chronic kidney disease with heart failure and stage 1 through stage 4 chronic kidney disease, or unspecified chronic kidney disease: Secondary | ICD-10-CM | POA: Diagnosis not present

## 2024-03-20 DIAGNOSIS — R109 Unspecified abdominal pain: Secondary | ICD-10-CM | POA: Diagnosis not present

## 2024-03-20 LAB — CBC WITH DIFFERENTIAL/PLATELET
Abs Immature Granulocytes: 0.06 K/uL (ref 0.00–0.07)
Basophils Absolute: 0 K/uL (ref 0.0–0.1)
Basophils Relative: 0 %
Eosinophils Absolute: 0 K/uL (ref 0.0–0.5)
Eosinophils Relative: 0 %
HCT: 50.8 % (ref 39.0–52.0)
Hemoglobin: 16.6 g/dL (ref 13.0–17.0)
Immature Granulocytes: 1 %
Lymphocytes Relative: 4 %
Lymphs Abs: 0.4 K/uL — ABNORMAL LOW (ref 0.7–4.0)
MCH: 30.5 pg (ref 26.0–34.0)
MCHC: 32.7 g/dL (ref 30.0–36.0)
MCV: 93.4 fL (ref 80.0–100.0)
Monocytes Absolute: 0.6 K/uL (ref 0.1–1.0)
Monocytes Relative: 4 %
Neutro Abs: 11.3 K/uL — ABNORMAL HIGH (ref 1.7–7.7)
Neutrophils Relative %: 91 %
Platelets: 65 K/uL — ABNORMAL LOW (ref 150–400)
RBC: 5.44 MIL/uL (ref 4.22–5.81)
RDW: 16.9 % — ABNORMAL HIGH (ref 11.5–15.5)
Smear Review: NORMAL
WBC: 12.4 K/uL — ABNORMAL HIGH (ref 4.0–10.5)
nRBC: 0 % (ref 0.0–0.2)

## 2024-03-20 LAB — COMPREHENSIVE METABOLIC PANEL WITH GFR
ALT: 12 U/L (ref 0–44)
AST: 35 U/L (ref 15–41)
Albumin: 2.8 g/dL — ABNORMAL LOW (ref 3.5–5.0)
Alkaline Phosphatase: 74 U/L (ref 38–126)
Anion gap: 15 (ref 5–15)
BUN: 25 mg/dL — ABNORMAL HIGH (ref 8–23)
CO2: 21 mmol/L — ABNORMAL LOW (ref 22–32)
Calcium: 8.3 mg/dL — ABNORMAL LOW (ref 8.9–10.3)
Chloride: 96 mmol/L — ABNORMAL LOW (ref 98–111)
Creatinine, Ser: 2.15 mg/dL — ABNORMAL HIGH (ref 0.61–1.24)
GFR, Estimated: 29 mL/min — ABNORMAL LOW (ref >60)
Glucose, Bld: 142 mg/dL — ABNORMAL HIGH (ref 70–99)
Potassium: 3.7 mmol/L (ref 3.5–5.1)
Sodium: 132 mmol/L — ABNORMAL LOW (ref 135–145)
Total Bilirubin: 2.8 mg/dL — ABNORMAL HIGH (ref 0.0–1.2)
Total Protein: 7.3 g/dL (ref 6.5–8.1)

## 2024-03-20 LAB — URINALYSIS, W/ REFLEX TO CULTURE (INFECTION SUSPECTED)
Bilirubin Urine: NEGATIVE
Glucose, UA: 150 mg/dL — AB
Ketones, ur: NEGATIVE mg/dL
Nitrite: NEGATIVE
Protein, ur: 100 mg/dL — AB
RBC / HPF: >50 RBC/hpf (ref 0–5)
Specific Gravity, Urine: 1.025 (ref 1.005–1.030)
WBC, UA: >50 WBC/hpf (ref 0–5)
pH: 6 (ref 5.0–8.0)

## 2024-03-20 LAB — LIPASE, BLOOD: Lipase: 29 U/L (ref 11–51)

## 2024-03-20 LAB — AMMONIA: Ammonia: 24 umol/L (ref 9–35)

## 2024-03-20 MED ORDER — CEPHALEXIN 500 MG PO CAPS: 500.0000 mg | ORAL_CAPSULE | Freq: Four times a day (QID) | ORAL | 0 refills | Status: AC

## 2024-03-20 MED ORDER — SODIUM CHLORIDE 0.9 % IV SOLN
1.0000 g | Freq: Once | INTRAVENOUS | Status: AC
  Administered 2024-03-20 (×2): 1 g via INTRAVENOUS
  Filled 2024-03-20: qty 10

## 2024-03-20 NOTE — ED Provider Notes (Signed)
 [place] EMERGENCY DEPARTMENT AT [facility] Provider Note  CSN: [id number] Arrival date & time: 03/20/24 1434  Chief Complaint(s) No chief complaint on file.  HPI Trevor Murphy is a 89 y.o. male with a history of chronic disease, liver cirrhosis, bladder cancer, here today for bleeding around his Foley catheter, abdominal distention.  Patient cirrhosis secondary to alcohol  use, continues to drink.  He is here today with his sister and son who provide history.  Patient Dors is increasing distention in his abdomen, it has been several months since he has had fluid removed.  He states that over the last few days he has been urinating around his Foley catheter.   Past Medical History Past Medical History:  Diagnosis Date   Acute blood loss anemia 08/11/2020   Acute on chronic renal failure (HCC) 07/27/2012   Alcohol  abuse 01/19/2014   Arthritis    CAD (coronary artery disease)    a. Cath 08/2009: minimal nonobstructive disease (25% LAD). b. Lexiscan  nuc 6/215: EF 54%, no ischemia or infarction.   Chronic diastolic CHF (congestive heart failure) (HCC)    a. Echo 2013: EF 55-60%, mild AI, mild MR. b. Echo 5/15 with EF 60-65%, no WMA, mild LVH, mild AI.   CKD (chronic kidney disease) stage 3, GFR 30-59 ml/min (HCC) 07/26/2012   a. Felt likely due to HTN.   CVA (cerebral infarction) 07/28/2012   Fall 07/05/2023   Fracture of inferior pubic ramus with routine healing 07/05/2023   Glaucoma    Gout    H/O umbilical hernia repair    Headache(784.0)     WHEN MY BLOOD PRESSURE IS UP   Hiatal hernia 09/23/2022   History of acute renal failure    History of alcohol  abuse    HTN (hypertension)    Hyperkalemia 01/27/2023   Hypertension    a. Patent renal arteries 2005.   Noncompliance    OA (osteoarthritis)    Other and unspecified hyperlipidemia    Other ascites 09/23/2022   Poor circulation    Sinus bradycardia    SVT (supraventricular tachycardia) (HCC)    a. Possible  AVNRT.  Got adenosine  from EMS in 2015 for SVT.  During 5/15 hospitalization had episode of short R-P tachycardia (possible AVNRT) terminated by adenosine .  b. 04/2016 - recurrent tachycardia (SVT vs. 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] 04/27/2023   Tachy-brady syndrome (HCC) 02/02/2023   BPH (benign prostatic hyperplasia) 01/27/2023   Alcoholic cirrhosis of liver with ascites (HCC) 01/27/2023   Incarcerated left inguinal hernia 11/20/2022   Thrombocytopenia (HCC) 11/20/2022   Severe mitral regurgitation 11/20/2022   Failure to thrive in adult 09/26/2022   Renal insufficiency 09/26/2022   Inguinal bulge 09/23/2022   Cirrhosis of liver with ascites (HCC) 09/23/2022   Hematuria, gross 06/18/2022   Implantable loop recorder present 06/18/2022   History of CVA (cerebrovascular accident) 06/08/2022   Duodenal ulcer, unspecified as acute or chronic, without hemorrhage or perforation 01/09/2022   Iron deficiency anemia 01/09/2022   PVD (peripheral  vascular disease) (HCC) 08/19/2021   Mixed hyperlipidemia 08/12/2020   Paroxysmal atrial fibrillation (HCC) 06/07/2020   Secondary hypercoagulable state (HCC) 06/07/2020   Ischemic cerebrovascular accident (CVA) of frontal lobe (HCC) 05/20/2019   Pain due to onychomycosis of toenails of both feet 01/20/2019   Prediabetes 07/22/2018   Nail abnormality 07/22/2018   Hypotension due to drugs 05/02/2016   History of tachycardia 05/02/2016   GERD  (gastroesophageal reflux disease) 02/05/2015   Coronary artery disease involving native coronary artery of native heart without angina pectoris 04/20/2014   Atypical chest pain 04/12/2014   Hypertensive urgency 03/11/2014   Chronic diastolic CHF (congestive heart failure) (HCC) 01/30/2014   Elevated troponin level not due myocardial infarction 12/19/2013   Gout 12/19/2013   Right knee pain 12/19/2013   Dyslipidemia 12/16/2013   Hypertensive crisis 12/16/2013   Cerebral infarction (HCC) 07/28/2012   Malignant systolic hypertension with CHF and chronic kidney disease, stage 1-4 or unspecified chronic kidney disease (HCC) 07/26/2012   Chronic kidney disease, stage 3b (HCC) 07/26/2012   Poor circulation    Recurrent ventral hernia 02/16/2012   Home Medication(s) Prior to Admission medications   Medication Sig Start Date End Date Taking? Authorizing Provider  acetaminophen  (TYLENOL ) 500 MG tablet Take 1,000 mg by mouth 2 (two) times daily as needed for mild pain or moderate pain (leg pain).    [provider]  albuterol  (VENTOLIN  HFA) 108 (90 Base) MCG/ACT inhaler INHALE 1 TO 2 PUFFS INTO THE LUNGS EVERY 6 HOURS AS NEEDED FOR WHEEZING OR SHORTNESS OF BREATH 03/29/23   [name], NP  carvedilol  (COREG ) 3.125 MG tablet Take 1 tablet (3.125 mg total) by mouth 2 (two) times daily with a meal. 08/31/23 11/29/23  [name], FNP  [name]  10 MG TABS tablet TAKE 1 TABLET(10 MG) BY MOUTH DAILY BEFORE BREAKFAST 02/21/24   [name], FNP  ferrous sulfate  (FEROSUL) 325 (65 FE) MG tablet Take 1 tablet (325 mg total) by mouth daily. 01/04/24   [name], FNP  finasteride  (PROSCAR ) 5 MG tablet Take 5 mg by mouth daily. 03/09/22   [provider]  fluticasone  (FLONASE ) 50 MCG/ACT nasal spray Place 2 sprays into both nostrils daily. 11/04/23   Kehrli, Kelsey F, PA-C  furosemide  (LASIX ) 20 MG tablet Take 1 tablet (20 mg total) by mouth as needed. Patient taking differently: Take 1 tablet  (20 mg total) by mouth as needed. 07/21/23   Thukkani, Arun K, MD  LUMIGAN 0.01 % SOLN Place 1 drop into both eyes at bedtime. 08/28/19   [provider]  meclizine  (ANTIVERT ) 25 MG tablet Take 1 tablet (25 mg total) by mouth 3 (three) times daily as needed for dizziness. 11/04/23   Kehrli, Kelsey F, PA-C  SIMBRINZA  1-0.2 % SUSP Follow up with your PCP on when to restart this medication. Patient taking differently: Place 1 drop into both eyes 3 (three) times daily. 06/09/22   Briana Elgin LABOR, MD  Vitamin D , Ergocalciferol , (DRISDOL) 1.25 MG (50000 UNIT) CAPS capsule Take 50,000 Units by mouth once a week. 07/16/23   [provider]  vitamin D3 (CHOLECALCIFEROL) 25 MCG tablet Take 1,000 Units by mouth daily.    [provider]  Past Surgical History Past Surgical History:  Procedure Laterality Date   APPENDECTOMY     BIOPSY  08/12/2020   Procedure: BIOPSY;  Surgeon: Kristie Lamprey, MD;  Location: [facility] ENDOSCOPY;  Service: Endoscopy;;   BUBBLE STUDY  05/24/2019   Procedure: BUBBLE STUDY;  Surgeon: Kate Lonni CROME, MD;  Location: [facility] ENDOSCOPY;  Service: Endoscopy;;   CARDIAC CATHETERIZATION  11/2003   EF 50-55%   CARDIAC CATHETERIZATION  08/2009   CATARACT EXTRACTION EXTRACAPSULAR Right 05/17/2013   Procedure: CATARACT EXTRACTION EXTRACAPSULAR WITH INTRAOCULAR LENS PLACEMENT (IOC) RIGHT EYE;  Surgeon: Gaither Quan, MD;  Location: [facility] OR;  Service: Ophthalmology;  Laterality: Right;   COLONOSCOPY     ESOPHAGOGASTRODUODENOSCOPY (EGD) WITH PROPOFOL  N/A 08/12/2020   Procedure: ESOPHAGOGASTRODUODENOSCOPY (EGD) WITH PROPOFOL ;  Surgeon: Kristie Lamprey, MD;  Location: [facility] ENDOSCOPY;  Service: Endoscopy;  Laterality: N/A;   HERNIA REPAIR     INGUINAL HERNIA REPAIR Left 11/20/2022   Procedure: OPEN LEFT INGUINAL HERNIA REPAIR FOR GANGRENE AND SMALL BOWEL  RESECTION WITH STINASTIMOSIST;  Surgeon: Kinsinger, Herlene Righter, MD;  Location: [facility] OR;  Service: General;  Laterality: Left;   IR PARACENTESIS  09/24/2022   LOOP RECORDER INSERTION N/A 05/24/2019   Procedure: LOOP RECORDER INSERTION;  Surgeon: Inocencio Soyla Lunger, MD;  Location: [facility] INVASIVE CV LAB;  Service: Cardiovascular;  Laterality: N/A;   TEE WITHOUT CARDIOVERSION N/A 05/24/2019   Procedure: TRANSESOPHAGEAL ECHOCARDIOGRAM (TEE);  Surgeon: Kate Lonni CROME, MD;  Location: [facility] ENDOSCOPY;  Service: Endoscopy;  Laterality: N/A;   VENTRAL HERNIA REPAIR  03/07/2012   Procedure: LAPAROSCOPIC VENTRAL HERNIA;  Surgeon: Lynwood MALVA Pina, MD;  Location: [facility] OR;  Service: General;  Laterality: N/A;   Family History Family History  Problem Relation Age of Onset   CVA Mother    Hypertension Brother    Hypertension Sister    Hypertension Brother    Stroke Sister    Heart attack Neg Hx     Social History Social History   Tobacco Use   Smoking status: Never   Smokeless tobacco: Never  Vaping Use   Vaping status: Never Used  Substance Use Topics   Alcohol  use: No    Alcohol /week: 1.0 standard drink of alcohol     Types: 1 Cans of beer per week    Comment: h/o heavy use, quit in 2017   Drug use: No   Allergies Patient has no known allergies.  Review of Systems Review of Systems  Physical Exam Vital Signs  I have reviewed the triage vital signs BP (!) 142/75 (BP Location: Right Arm)   Pulse 66   Temp 98.4 F (36.9 C) (Oral)   Resp 18   SpO2 98%   Physical Exam Vitals and nursing note reviewed.  Eyes:     Pupils: Pupils are equal, round, and reactive to light.  Pulmonary:     Effort: Pulmonary effort is normal.  Abdominal:     General: Abdomen is flat. There is distension.     Palpations: Abdomen is soft.     Tenderness: There is no abdominal tenderness. There is no guarding.  Genitourinary:    Penis: Normal.      Comments: Foley catheter present, no breakdown of the  glans Musculoskeletal:        General: No swelling or deformity. Normal range of motion.     Cervical back: Normal range of motion.  Skin:    General: Skin is warm.  Neurological:     General: No focal deficit present.  Mental Status: He is alert.     ED Results and Treatments Labs (all labs ordered are listed, but only abnormal results are displayed) Labs Reviewed  COMPREHENSIVE METABOLIC PANEL WITH GFR - Abnormal; Notable for the following components:      Result Value   Sodium 132 (*)    Chloride 96 (*)    CO2 21 (*)    Glucose, Bld 142 (*)    BUN 25 (*)    Creatinine, Ser 2.15 (*)    Calcium  8.3 (*)    Albumin  2.8 (*)    Total Bilirubin 2.8 (*)    GFR, Estimated 29 (*)    All other components within normal limits  CBC WITH DIFFERENTIAL/PLATELET - Abnormal; Notable for the following components:   WBC 12.4 (*)    RDW 16.9 (*)    Platelets 65 (*)    Neutro Abs 11.3 (*)    Lymphs Abs 0.4 (*)    All other components within normal limits  URINALYSIS, W/ REFLEX TO CULTURE (INFECTION SUSPECTED) - Abnormal; Notable for the following components:   Color, Urine RED (*)    APPearance CLOUDY (*)    Glucose, UA 150 (*)    Hgb urine dipstick LARGE (*)    Protein, ur 100 (*)    Leukocytes,Ua TRACE (*)    Bacteria, UA MANY (*)    All other components within normal limits  URINE CULTURE  LIPASE, BLOOD  AMMONIA                                                                                                                          Radiology CT ABDOMEN PELVIS WO CONTRAST Result Date: 03/20/2024 CLINICAL DATA:  Abdominal pain. Leaking Foley catheter for the past 2 days. Clinical concern for ascites. EXAM: CT ABDOMEN AND PELVIS WITHOUT CONTRAST TECHNIQUE: Multidetector CT imaging of the abdomen and pelvis was performed following the standard protocol without IV contrast. RADIATION DOSE REDUCTION: This exam was performed according to the departmental dose-optimization program which  includes automated exposure control, adjustment of the mA and/or kV according to patient size and/or use of iterative reconstruction technique. COMPARISON:  03/06/2024 FINDINGS: Lower chest: Stable enlarged heart and gynecomastia. Clear lung bases. Hepatobiliary: Stable distended gallbladder containing multiple dependent gallstones measuring up to 9 mm in maximum diameter each. No gallbladder wall thickening or pericholecystic fluid. Normal-appearing liver. Pancreas: Unremarkable. No pancreatic ductal dilatation or surrounding inflammatory changes. Spleen: The spleen remains borderline enlarged with a length of 12.8 cm today. Adrenals/Urinary Tract: Normal-appearing adrenal glands. Stable bilateral simple appearing renal cysts, not needing imaging follow-up. Foley catheter in the urinary bladder with mild-to-moderate diffuse wall thickening. Small bubble of air extending through the anterior wall of the bladder. No adjacent extraluminal air or free peritoneal air. Unremarkable ureters. Stomach/Bowel: Small bowel anastomosis in the mid abdomen anteriorly. Surgically absent appendix. Moderate to large-sized internal hernia containing small bowel loops in the mid to lower abdomen in the midline. No bowel dilatation  or wall thickening. Proximal sigmoid colon diverticula with no interval pericolonic stranding or fluid. There is some chronic stranding throughout the abdomen. Vascular/Lymphatic: Atheromatous arterial calcifications without aneurysm. No enlarged lymph nodes. Reproductive: Moderately enlarged prostate gland. Other: Stable small to moderate-sized midline supraumbilical hernia containing herniated fat. No free peritoneal fluid or air. Musculoskeletal: Lumbar and lower thoracic spine degenerative changes. IMPRESSION: 1. No ascites. 2. Mild-to-moderate diffuse urinary bladder wall thickening with a small bubble of air extending through the anterior wall of the bladder. These small bubble of air has a appearance  suggesting a small focal defect in the bladder wall without adjacent air or fluid to indicate an active bladder leak. The wall thickening could be due to cystitis or chronic bladder outlet obstruction by the enlarged prostate gland. 3. Moderate to large-sized internal hernia containing small bowel loops in the mid to lower abdomen in the midline. No evidence of bowel obstruction. 4. Stable small to moderate-sized midline supraumbilical hernia containing herniated fat. 5. Stable cholelithiasis. 6. Stable borderline splenomegaly. 7. Stable cardiomegaly. 8. Stable sigmoid diverticulosis without evidence of diverticulitis. Electronically Signed   By: Elspeth Bathe M.D.   On: 03/20/2024 18:59    Pertinent labs & imaging results that were available during my care of the patient were reviewed by me and considered in my medical decision making (see MDM for details).  Medications Ordered in ED Medications  cefTRIAXone  (ROCEPHIN ) 1 g in sodium chloride  0.9 % 100 mL IVPB (1 g Intravenous New Bag/Given 03/20/24 1953)                                                                                                                                     Procedures Procedures  (including critical care time)  Medical Decision Making / ED Course   This patient presents to the ED for concern of abdominal distention, Foley catheter issue, this involves an extensive number of treatment options, and is a complaint that carries with it a high risk of complications and morbidity.  The differential diagnosis includes cirrhosis, ascites, less likely SBP, UTI, clogged Foley catheter.  MDM: Regarding the patient's abdominal swelling, with his history of cirrhosis secondary to alcohol  abuse, likely requires more regular paracenteses.  I have lower suspicion for SBP.  Will plan to perform therapeutic para here in the ED for the patient following CT imaging.  He does have a history of bladder cancer, is been told by his urologist  that his life expectancy is approximately 1 year.  They have not discussed hospice or palliative care.  Will plan to replace patient's Foley catheter, there is a fair amount of dark urine.  Suspect patient likely with UTI, some clogging of his Foley catheter.  Reassessment 7:50 PM-no ascites on CT imaging.  Some abdominal distention, likely secondary to nonincarcerated abdominal hernia.  There are some gas in the patient's abdomen, question of some bladder wall destruction.  With his  history of bladder cancer, likely chronic.  He is going to follow-up with his urologist.  Urinalysis does show UTI.  He has not had any leaking around his catheter since we have replaced it.  Counseled the patient on alcohol  reduction.   Additional history obtained: -Additional history obtained from family at bedside -External records from outside source obtained and reviewed including: Chart review including previous notes, labs, imaging, consultation notes   Lab Tests: -I ordered, reviewed, and interpreted labs.   The pertinent results include:   Labs Reviewed  COMPREHENSIVE METABOLIC PANEL WITH GFR - Abnormal; Notable for the following components:      Result Value   Sodium 132 (*)    Chloride 96 (*)    CO2 21 (*)    Glucose, Bld 142 (*)    BUN 25 (*)    Creatinine, Ser 2.15 (*)    Calcium  8.3 (*)    Albumin  2.8 (*)    Total Bilirubin 2.8 (*)    GFR, Estimated 29 (*)    All other components within normal limits  CBC WITH DIFFERENTIAL/PLATELET - Abnormal; Notable for the following components:   WBC 12.4 (*)    RDW 16.9 (*)    Platelets 65 (*)    Neutro Abs 11.3 (*)    Lymphs Abs 0.4 (*)    All other components within normal limits  URINALYSIS, W/ REFLEX TO CULTURE (INFECTION SUSPECTED) - Abnormal; Notable for the following components:   Color, Urine RED (*)    APPearance CLOUDY (*)    Glucose, UA 150 (*)    Hgb urine dipstick LARGE (*)    Protein, ur 100 (*)    Leukocytes,Ua TRACE (*)     Bacteria, UA MANY (*)    All other components within normal limits  URINE CULTURE  LIPASE, BLOOD  AMMONIA     CT abdomen pelvis  Imaging Studies ordered:  I independently visualized and interpreted imaging. I agree with the radiologist interpretation   Medicines ordered and prescription drug management: Meds ordered this encounter  Medications   cefTRIAXone  (ROCEPHIN ) 1 g in sodium chloride  0.9 % 100 mL IVPB    Antibiotic Indication::   UTI    -I have reviewed the patients home medicines and have made adjustments as needed   Cardiac Monitoring: The patient was maintained on a cardiac monitor.  I personally viewed and interpreted the cardiac monitored which showed an underlying rhythm of: Normal sinus rhythm  Social Determinants of Health:  Factors impacting patients care include: Medical comorbidities including chronic kidney disease, heart failure, bladder cancer   Reevaluation: After the interventions noted above, I reevaluated the patient and found that they have :improved  Co morbidities that complicate the patient evaluation  Past Medical History:  Diagnosis Date   Acute blood loss anemia 08/11/2020   Acute on chronic renal failure (HCC) 07/27/2012   Alcohol  abuse 01/19/2014   Arthritis    CAD (coronary artery disease)    a. Cath 08/2009: minimal nonobstructive disease (25% LAD). b. Lexiscan  nuc 6/215: EF 54%, no ischemia or infarction.   Chronic diastolic CHF (congestive heart failure) (HCC)    a. Echo 2013: EF 55-60%, mild AI, mild MR. b. Echo 5/15 with EF 60-65%, no WMA, mild LVH, mild AI.   CKD (chronic kidney disease) stage 3, GFR 30-59 ml/min (HCC) 07/26/2012   a. Felt likely due to HTN.   CVA (cerebral infarction) 07/28/2012   Fall 07/05/2023   Fracture of inferior pubic ramus with routine  healing 07/05/2023   Glaucoma    Gout    H/O umbilical hernia repair    Headache(784.0)     WHEN MY BLOOD PRESSURE IS UP   Hiatal hernia 09/23/2022   History of  acute renal failure    History of alcohol  abuse    HTN (hypertension)    Hyperkalemia 01/27/2023   Hypertension    a. Patent renal arteries 2005.   Noncompliance    OA (osteoarthritis)    Other and unspecified hyperlipidemia    Other ascites 09/23/2022   Poor circulation    Sinus bradycardia    SVT (supraventricular tachycardia) (HCC)    a. Possible AVNRT.  Got adenosine  from EMS in 2015 for SVT.  During 5/15 hospitalization had episode of short R-P tachycardia (possible AVNRT) terminated by adenosine .  b. 04/2016 - recurrent tachycardia (SVT vs. AVNRT), again terminated by Adenosine    Symptomatic bradycardia 02/02/2023   Syncope 01/27/2023   Visual impairment    Weakness 09/23/2022      Dispostion: I considered admission for this patient, however he is appropriate for outpatient follow-up.     Final Clinical Impression(s) / ED Diagnoses Final diagnoses:  Acute cystitis with hematuria     @PCDICTATION @    Mannie Pac T, DO 03/20/24 1957

## 2024-03-20 NOTE — Discharge Instructions (Signed)
 Your blood work was at your baseline.  Your CT scan showed some breakdown of your bladder, likely related to your bladder cancer.  Please follow-up with your urologist.  I have sent your prescription for Keflex .  This is an antibiotics.  You can take this 4 times per day for the next 1 week.  Return to emergency room for worsening pain in your abdomen, fever, confusion.

## 2024-03-20 NOTE — ED Triage Notes (Addendum)
 Pt reports with abdominal pain and a leaking foley catheter x 2 days.

## 2024-03-22 DIAGNOSIS — H4323 Crystalline deposits in vitreous body, bilateral: Secondary | ICD-10-CM | POA: Diagnosis not present

## 2024-03-22 DIAGNOSIS — H35033 Hypertensive retinopathy, bilateral: Secondary | ICD-10-CM | POA: Diagnosis not present

## 2024-03-22 DIAGNOSIS — H401133 Primary open-angle glaucoma, bilateral, severe stage: Secondary | ICD-10-CM | POA: Diagnosis not present

## 2024-03-23 LAB — URINE CULTURE: Culture: >=100000 — AB

## 2024-03-24 ENCOUNTER — Telehealth (HOSPITAL_BASED_OUTPATIENT_CLINIC_OR_DEPARTMENT_OTHER): Payer: Self-pay | Admitting: *Deleted

## 2024-03-24 NOTE — Telephone Encounter (Signed)
 Post ED Visit - Positive Culture Follow-up  Culture report reviewed by antimicrobial stewardship pharmacist: [facility] []  Rankin Dee, Pharm.D. []  Venetia Gully, Pharm.D., BCPS AQ-ID []  Garrel Crews, Pharm.D., BCPS []  Almarie Lunger, Pharm.D., BCPS []  [place], [address].D., BCPS, AAHIVP []  Rosaline Bihari, Pharm.D., BCPS, AAHIVP []  Vernell Meier, PharmD, BCPS []  Latanya Hint, PharmD, BCPS []  Donald Medley, PharmD, BCPS []  Rocky Bold, PharmD []  Dorothyann Alert, PharmD, BCPS []  Morene Babe, PharmD  [facility] [x]  Izetta Carl, PharmD []  Romona Bliss, PharmD []  Dolphus Roller, PharmD []  Veva Seip, Rph []  Vernell Daunt) Leonce, PharmD []  Eva Allis, PharmD []  Rosaline Millet, PharmD []  Iantha Batch, PharmD []  Arvin Gauss, PharmD []  Wanda Hasting, PharmD []  Ronal Rav, PharmD []  Rocky Rumsey, PharmD []  Bard Jeans, PharmD   Positive urine culture Treated with cephalexin , organism sensitive to the same and no further patient follow-up is required at this time.  Trevor Murphy 03/24/2024, 9:02 AM

## 2024-03-29 ENCOUNTER — Ambulatory Visit: Admitting: Family Medicine

## 2024-04-03 ENCOUNTER — Telehealth

## 2024-04-05 ENCOUNTER — Other Ambulatory Visit: Payer: Self-pay

## 2024-04-05 NOTE — Patient Instructions (Signed)
 Visit Information  Thank you for taking time to visit with me today. Please don't hesitate to contact me if I can be of assistance to you before our next scheduled appointment.  Your next care management appointment is by telephone on Friday, September 12 at 10:15 AM  Please call the care guide team at [phone] if you need to cancel, schedule, or reschedule an appointment.   Please call [phone]-TALK (toll free, 24 hour hotline) if you are experiencing a Mental Health or Behavioral Health Crisis or need someone to talk to.  Clayborne Ly RN BSN CCM [place]  [facility], [facility] Nurse Care Coordinator  Direct Dial: [phone] Website: [email or web address]

## 2024-04-05 NOTE — Patient Outreach (Signed)
 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 I obtained verbal consent from Patient.  Visit completed with Patient on the phone.  Background:   Past Medical History:  Diagnosis Date   Acute blood loss anemia 08/11/2020   Acute on chronic renal failure (HCC) 07/27/2012   Alcohol  abuse 01/19/2014   Arthritis    CAD (coronary artery disease)    a. Cath 08/2009: minimal nonobstructive disease (25% LAD). b. Lexiscan  nuc 6/215: EF 54%, no ischemia or infarction.   Chronic diastolic CHF (congestive heart failure) (HCC)    a. Echo 2013: EF 55-60%, mild AI, mild MR. b. Echo 5/15 with EF 60-65%, no WMA, mild LVH, mild AI.   CKD (chronic kidney disease) stage 3, GFR 30-59 ml/min (HCC) 07/26/2012   a. Felt likely due to HTN.   CVA (cerebral infarction) 07/28/2012   Fall 07/05/2023   Fracture of inferior pubic ramus with routine healing 07/05/2023   Glaucoma    Gout    H/O umbilical hernia repair    Headache(784.0)     WHEN MY BLOOD PRESSURE IS UP   Hiatal hernia 09/23/2022   History of acute renal failure    History of alcohol  abuse    HTN (hypertension)    Hyperkalemia 01/27/2023   Hypertension    a. Patent renal arteries 2005.   Noncompliance    OA (osteoarthritis)    Other and unspecified hyperlipidemia    Other ascites 09/23/2022   Poor circulation    Sinus bradycardia    SVT (supraventricular tachycardia) (HCC)    a. Possible AVNRT.  Got adenosine  from EMS in 2015 for SVT.  During 5/15 hospitalization had episode of short R-P tachycardia (possible AVNRT) terminated by adenosine .  b. 04/2016 - recurrent tachycardia (SVT vs. AVNRT), again terminated by Adenosine    Symptomatic bradycardia  02/02/2023   Syncope 01/27/2023   Visual impairment    Weakness 09/23/2022    Assessment: Patient Reported Symptoms:  Cognitive Cognitive Status: No symptoms reported (spoke with [name])      Neurological Neurological Review of Symptoms: Not assessed    HEENT HEENT Symptoms Reported: No symptoms reported      Cardiovascular Cardiovascular Symptoms Reported: No symptoms reported    Respiratory Respiratory Symptoms Reported: No symptoms reported    Endocrine Endocrine Symptoms Reported: No symptoms reported Is patient diabetic?: No    Gastrointestinal Gastrointestinal Symptoms Reported: Abdominal pain or discomfort, Distention Gastrointestinal Management Strategies: Adequate rest Gastrointestinal Self-Management Outcome: 3 (uncertain)    Genitourinary Genitourinary Symptoms Reported: Blood in urine Genitourinary Management Strategies: Catheter, indwelling Indwelling Catheter Inserted: 03/14/24 Genitourinary Self-Management Outcome: 3 (uncertain)  Integumentary Integumentary Symptoms Reported: No symptoms reported    Musculoskeletal Musculoskelatal Symptoms Reviewed: Difficulty walking, Limited mobility, Weakness Musculoskeletal Management Strategies: Adequate rest, Medical device, Routine screening Musculoskeletal Self-Management Outcome: 3 (uncertain)      Psychosocial Psychosocial Symptoms Reported: No symptoms reported     Quality of Family Relationships: involved, helpful, supportive Do you feel physically threatened by others?: No    04/05/2024    PHQ2-9 Depression Screening   Trevor Murphy interest or pleasure in doing things    Feeling down, depressed, or hopeless    PHQ-2 - Total Score    Trouble falling or staying asleep, or  sleeping too much    Feeling tired or having Trevor Murphy energy    Poor appetite or overeating     Feeling bad about yourself - or that you are a failure or have let yourself or your family down    Trouble concentrating on things, such as  reading the newspaper or watching television    Moving or speaking so slowly that other people could have noticed.  Or the opposite - being so fidgety or restless that you have been moving around a lot more than usual    Thoughts that you would be better off dead, or hurting yourself in some way    PHQ2-9 Total Score    If you checked off any problems, how difficult have these problems made it for you to do your work, take care of things at home, or get along with other people    Depression Interventions/Treatment      There were no vitals filed for this visit.  Medications Reviewed Today     Reviewed by Trevor Clayborne CROME, RN (Registered Nurse) on 04/05/24 at 1246  Med List Status: <None>   Medication Order Taking? Sig Documenting Provider Last Dose Status Informant  acetaminophen  (TYLENOL ) 500 MG tablet [id number] No Take 1,000 mg by mouth 2 (two) times daily as needed for mild pain or moderate pain (leg pain). [provider] Taking Active Family Member  albuterol  (VENTOLIN  HFA) 108 [phone] Base) MCG/ACT inhaler [id number] No INHALE 1 TO 2 PUFFS INTO THE LUNGS EVERY 6 HOURS AS NEEDED FOR WHEEZING OR SHORTNESS OF Trevor [name], NP Taking Active   carvedilol  (COREG ) 3.125 MG tablet [id number] No Take 1 tablet (3.125 mg total) by mouth 2 (two) times daily with a meal. [name], FNP Taking Expired 11/29/23 2359   cephALEXin  (KEFLEX ) 500 MG capsule [id number]  Take 1 capsule (500 mg total) by mouth 4 (four) times daily. Mannie Pac T, DO  Active   [name]  10 MG TABS tablet [id number]  TAKE 1 TABLET(10 MG) BY MOUTH DAILY BEFORE Trevor Murphy [name], FNP  Active   ferrous sulfate  (FEROSUL) 325 (65 FE) MG tablet [id number]  Take 1 tablet (325 mg total) by mouth daily. [name], FNP  Active   finasteride  (PROSCAR ) 5 MG tablet [id number] No Take 5 mg by mouth daily. [provider] Taking Active Family Member  fluticasone  (FLONASE ) 50 MCG/ACT nasal spray [id number] No Place 2  sprays into both nostrils daily. Kehrli, Kelsey F, PA-C Taking Active   furosemide  (LASIX ) 20 MG tablet [id number]  Take 1 tablet (20 mg total) by mouth as needed.  Patient taking differently: Take 1 tablet (20 mg total) by mouth as needed.   Thukkani, Arun K, MD  Active   LUMIGAN 0.01 % SOLN [id number] No Place 1 drop into both eyes at bedtime. [provider] Taking Active Family Member           Med Note (WHITE, DONETA GORMAN [place] Jan 27, 2023  9:42 PM) Extra drop placed today, due to blurry vision  meclizine  (ANTIVERT ) 25 MG tablet [id number] No Take 1 tablet (25 mg total) by mouth 3 (three) times daily as needed for dizziness. Kehrli, Kelsey F, PA-C Taking Active   SIMBRINZA  1-0.2 % SUSP [id number] No Follow up with your PCP on when to restart this medication.  Patient taking differently: Place 1 drop into both eyes 3 (three) times daily.   Briana Elgin LABOR, MD Taking Active Family Member  Vitamin D , Ergocalciferol , (DRISDOL)  1.25 MG (50000 UNIT) CAPS capsule [id number] No Take 50,000 Units by mouth once a week. [provider] Taking Active   vitamin D3 (CHOLECALCIFEROL) 25 MCG tablet [id number] No Take 1,000 Units by mouth daily. [provider] Taking Active Family Member  Med List Note Isabel Doneta GORMAN [place] 01/27/23 2146): Sister, [place], assists patient with medication            Recommendation:   Specialty provider follow-up [facility] on 04/12/24 at 09:30 AM  Follow Up Plan:   Telephone follow up appointment date/time:  Friday, September 12 at 10:15 AM  Clayborne Ly RN BSN CCM [place]  [facility], [facility] Nurse Care Coordinator  Direct Dial: [phone] Website: [email or web address]

## 2024-04-10 ENCOUNTER — Ambulatory Visit

## 2024-04-10 DIAGNOSIS — I495 Sick sinus syndrome: Secondary | ICD-10-CM

## 2024-04-12 LAB — CUP PACEART REMOTE DEVICE CHECK
Date Time Interrogation Session: 20250830230649
Implantable Pulse Generator Implant Date: 20201014

## 2024-04-13 DIAGNOSIS — R338 Other retention of urine: Secondary | ICD-10-CM | POA: Diagnosis not present

## 2024-04-14 ENCOUNTER — Ambulatory Visit: Payer: Self-pay | Admitting: Cardiology

## 2024-04-17 ENCOUNTER — Telehealth

## 2024-04-18 NOTE — Progress Notes (Signed)
 Remote Loop Recorder Transmission

## 2024-04-21 ENCOUNTER — Ambulatory Visit: Payer: Medicaid Other

## 2024-04-21 ENCOUNTER — Other Ambulatory Visit: Payer: Self-pay

## 2024-04-21 NOTE — Patient Outreach (Addendum)
 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 I obtained verbal consent from Caregiver.  Visit completed with Caregiver/sister [name] on the phone.  Background:   Past Medical History:  Diagnosis Date   Acute blood loss anemia 08/11/2020   Acute on chronic renal failure (HCC) 07/27/2012   Alcohol  abuse 01/19/2014   Arthritis    CAD (coronary artery disease)    a. Cath 08/2009: minimal nonobstructive disease (25% LAD). b. Lexiscan  nuc 6/215: EF 54%, no ischemia or infarction.   Chronic diastolic CHF (congestive heart failure) (HCC)    a. Echo 2013: EF 55-60%, mild AI, mild MR. b. Echo 5/15 with EF 60-65%, no WMA, mild LVH, mild AI.   CKD (chronic kidney disease) stage 3, GFR 30-59 ml/min (HCC) 07/26/2012   a. Felt likely due to HTN.   CVA (cerebral infarction) 07/28/2012   Fall 07/05/2023   Fracture of inferior pubic ramus with routine healing 07/05/2023   Glaucoma    Gout    H/O umbilical hernia repair    Headache(784.0)     WHEN MY BLOOD PRESSURE IS UP   Hiatal hernia 09/23/2022   History of acute renal failure    History of alcohol  abuse    HTN (hypertension)    Hyperkalemia 01/27/2023   Hypertension    a. Patent renal arteries 2005.   Noncompliance    OA (osteoarthritis)    Other and unspecified hyperlipidemia    Other ascites 09/23/2022   Poor circulation    Sinus bradycardia    SVT (supraventricular tachycardia) (HCC)    a. Possible AVNRT.  Got adenosine  from EMS in 2015 for SVT.  During 5/15 hospitalization had episode of short R-P tachycardia (possible AVNRT) terminated by adenosine .  b. 04/2016 - recurrent tachycardia (SVT vs. AVNRT), again terminated by Adenosine     Symptomatic bradycardia 02/02/2023   Syncope 01/27/2023   Visual impairment    Weakness 09/23/2022    Assessment: Patient Reported Symptoms:  Cognitive Cognitive Status: No symptoms reported (completed call with sister [name]) Cognitive/Intellectual Conditions Management [RPT]: None reported or documented in medical history or problem list   Health Maintenance Behaviors: Annual physical exam  Neurological Neurological Review of Symptoms: Not assessed    HEENT HEENT Symptoms Reported: Not assessed      Cardiovascular Cardiovascular Symptoms Reported: No symptoms reported    Respiratory Respiratory Symptoms Reported: No symptoms reported    Endocrine Endocrine Symptoms Reported: Not assessed    Gastrointestinal Gastrointestinal Symptoms Reported: Distention Gastrointestinal Management Strategies: Adequate rest Gastrointestinal Self-Management Outcome: 3 (uncertain)    Genitourinary Genitourinary Symptoms Reported: Blood in urine Genitourinary Management Strategies: Catheter, indwelling Genitourinary Self-Management Outcome: 4 (good) Genitourinary Comment: [facility] is changing patient's foley catheter monthly  Integumentary Integumentary Symptoms Reported: Not assessed    Musculoskeletal Musculoskelatal Symptoms Reviewed: Difficulty walking, Limited mobility, Weakness Musculoskeletal Management Strategies: Adequate rest, Medical device, Routine screening Musculoskeletal Self-Management Outcome: 3 (uncertain) Number of falls in past year: 2 or more Was there an injury with Fall?: No Patient at Risk for Falls Due to: Impaired balance/gait, Impaired mobility, Other (Comment) (Alcohol  dependence) Fall risk Follow up: Falls evaluation completed, Education provided, Falls prevention discussed  Psychosocial Psychosocial Symptoms Reported: Substance use Additional Psychological Details: Alcohol  dependence Behavioral Management  Strategies: Support system Behavioral Health  Self-Management Outcome: 3 (uncertain) Major Change/Loss/Stressor/Fears (CP): Denies Quality of Family Relationships: involved, helpful, supportive Do you feel physically threatened by others?: No    04/21/2024    PHQ2-9 Depression Screening   Lucendia Leard interest or pleasure in doing things    Feeling down, depressed, or hopeless    PHQ-2 - Total Score    Trouble falling or staying asleep, or sleeping too much    Feeling tired or having Julya Alioto energy    Poor appetite or overeating     Feeling bad about yourself - or that you are a failure or have let yourself or your family down    Trouble concentrating on things, such as reading the newspaper or watching television    Moving or speaking so slowly that other people could have noticed.  Or the opposite - being so fidgety or restless that you have been moving around a lot more than usual    Thoughts that you would be better off dead, or hurting yourself in some way    PHQ2-9 Total Score    If you checked off any problems, how difficult have these problems made it for you to do your work, take care of things at home, or get along with other people    Depression Interventions/Treatment      There were no vitals filed for this visit.  Medications Reviewed Today     Reviewed by [name], RN (Registered Nurse) on 04/21/24 at 1017  Med List Status: <None>   Medication Order Taking? Sig Documenting Provider Last Dose Status Informant  acetaminophen  (TYLENOL ) 500 MG tablet [id number] No Take 1,000 mg by mouth 2 (two) times daily as needed for mild pain or moderate pain (leg pain). [provider] Taking Active Family Member  albuterol  (VENTOLIN  HFA) 108 [phone] Base) MCG/ACT inhaler [id number] No INHALE 1 TO 2 PUFFS INTO THE LUNGS EVERY 6 HOURS AS NEEDED FOR WHEEZING OR SHORTNESS OF [name] [name], NP Taking Active   carvedilol  (COREG ) 3.125 MG tablet [id number] No Take 1 tablet (3.125 mg total) by mouth 2 (two) times daily with a  meal. [name], FNP Taking Expired 11/29/23 2359   cephALEXin  (KEFLEX ) 500 MG capsule [id number]  Take 1 capsule (500 mg total) by mouth 4 (four) times daily. Mannie Fairy DASEN, DO  Active   [name]  10 MG TABS tablet [id number]  TAKE 1 TABLET(10 MG) BY MOUTH DAILY BEFORE [name] [name], FNP  Active   ferrous sulfate  (FEROSUL) 325 (65 FE) MG tablet [id number]  Take 1 tablet (325 mg total) by mouth daily. [name], FNP  Active   finasteride  (PROSCAR ) 5 MG tablet [id number] No Take 5 mg by mouth daily. [provider] Taking Active Family Member  fluticasone  (FLONASE ) 50 MCG/ACT nasal spray [id number] No Place 2 sprays into both nostrils daily. Kehrli, Kelsey F, PA-C Taking Active   furosemide  (LASIX ) 20 MG tablet [id number]  Take 1 tablet (20 mg total) by mouth as needed.  Patient taking differently: Take 1 tablet (20 mg total) by mouth as needed.   Thukkani, Arun K, MD  Active   LUMIGAN 0.01 % SOLN [id number] No Place 1 drop into both eyes at bedtime. [provider] Taking Active Family Member           Med Note (WHITE, DONETA GORMAN [place] Jan 27, 2023  9:42 PM) Extra drop placed today, due to blurry vision  meclizine  (ANTIVERT ) 25 MG tablet [id number]  No Take 1 tablet (25 mg total) by mouth 3 (three) times daily as needed for dizziness. Kehrli, Kelsey F, PA-C Taking Active   SIMBRINZA  1-0.2 % SUSP [id number] No Follow up with your PCP on when to restart this medication.  Patient taking differently: Place 1 drop into both eyes 3 (three) times daily.   Briana Elgin LABOR, MD Taking Active Family Member  Vitamin D , Ergocalciferol , (DRISDOL) 1.25 MG (50000 UNIT) CAPS capsule [id number] No Take 50,000 Units by mouth once a week. [provider] Taking Active   vitamin D3 (CHOLECALCIFEROL) 25 MCG tablet [id number] No Take 1,000 Units by mouth daily. [provider] Taking Active Family Member  Med List Note Isabel Doneta GORMAN [place] 01/27/23 2146): Sister, [place], assists  patient with medication            Recommendation:   PCP Follow-up with [name] FNP on 05/01/24 at 11:40 AM  Follow Up Plan:   Telephone follow up appointment date/time:  Thursday, October 3 at 11:30 AM  Clayborne Ly RN BSN CCM [place]  [facility], [facility] Nurse Care Coordinator  Direct Dial: [phone] Website: [email or web address]

## 2024-04-21 NOTE — Patient Instructions (Signed)
 Visit Information  Thank you for taking time to visit with me today. Please don't hesitate to contact me if I can be of assistance to you before our next scheduled appointment.  Your next care management appointment is by telephone on Thursday, October 3 at 11:30 AM  Please call the care guide team at [phone] if you need to cancel, schedule, or reschedule an appointment.   Please call [phone]-TALK (toll free, 24 hour hotline) if you are experiencing a Mental Health or Behavioral Health Crisis or need someone to talk to.  Clayborne Ly RN BSN CCM [place]  [facility], [facility] Nurse Care Coordinator  Direct Dial: [phone] Website: [email or web address]

## 2024-05-01 ENCOUNTER — Encounter: Payer: Self-pay | Admitting: Nurse Practitioner

## 2024-05-01 NOTE — Progress Notes (Deleted)
 LILLETTE Trevor Murphy, CMA,acting as a Neurosurgeon for Trevor Ada, FNP.,have documented all relevant documentation on the behalf of Trevor Ada, FNP,as directed by  Trevor Ada, FNP while in the presence of Trevor Ada, FNP.  Subjective:   Patient ID: Trevor Murphy , male    DOB: 22-Jan-1935 , 89 y.o.   MRN: [id number]  No chief complaint on file.   HPI  HPI   Past Medical History:  Diagnosis Date   Acute blood loss anemia 08/11/2020   Acute on chronic renal failure (HCC) 07/27/2012   Alcohol  abuse 01/19/2014   Arthritis    CAD (coronary artery disease)    a. Cath 08/2009: minimal nonobstructive disease (25% LAD). b. Lexiscan  nuc 6/215: EF 54%, no ischemia or infarction.   Chronic diastolic CHF (congestive heart failure) (HCC)    a. Echo 2013: EF 55-60%, mild AI, mild MR. b. Echo 5/15 with EF 60-65%, no WMA, mild LVH, mild AI.   CKD (chronic kidney disease) stage 3, GFR 30-59 ml/min (HCC) 07/26/2012   a. Felt likely due to HTN.   CVA (cerebral infarction) 07/28/2012   Fall 07/05/2023   Fracture of inferior pubic ramus with routine healing 07/05/2023   Glaucoma    Gout    H/O umbilical hernia repair    Headache(784.0)     WHEN MY BLOOD PRESSURE IS UP   Hiatal hernia 09/23/2022   History of acute renal failure    History of alcohol  abuse    HTN (hypertension)    Hyperkalemia 01/27/2023   Hypertension    a. Patent renal arteries 2005.   Noncompliance    OA (osteoarthritis)    Other and unspecified hyperlipidemia    Other ascites 09/23/2022   Poor circulation    Sinus bradycardia    SVT (supraventricular tachycardia) (HCC)    a. Possible AVNRT.  Got adenosine  from EMS in 2015 for SVT.  During 5/15 hospitalization had episode of short R-P tachycardia (possible AVNRT) terminated by adenosine .  b. 04/2016 - recurrent tachycardia (SVT vs. AVNRT), again terminated by Adenosine    Symptomatic bradycardia 02/02/2023   Syncope 01/27/2023   Visual impairment    Weakness 09/23/2022      Family History  Problem Relation Age of Onset   CVA Mother    Hypertension Brother    Hypertension Sister    Hypertension Brother    Stroke Sister    Heart attack Neg Hx      Current Outpatient Medications:    acetaminophen  (TYLENOL ) 500 MG tablet, Take 1,000 mg by mouth 2 (two) times daily as needed for mild pain or moderate pain (leg pain)., Disp: , Rfl:    albuterol  (VENTOLIN  HFA) 108 (90 Base) MCG/ACT inhaler, INHALE 1 TO 2 PUFFS INTO THE LUNGS EVERY 6 HOURS AS NEEDED FOR WHEEZING OR SHORTNESS OF BREATH, Disp: 6.7 g, Rfl: 0   carvedilol  (COREG ) 3.125 MG tablet, Take 1 tablet (3.125 mg total) by mouth 2 (two) times daily with a meal., Disp: 180 tablet, Rfl: 2   cephALEXin  (KEFLEX ) 500 MG capsule, Take 1 capsule (500 mg total) by mouth 4 (four) times daily., Disp: 28 capsule, Rfl: 0   [name]  10 MG TABS tablet, TAKE 1 TABLET(10 MG) BY MOUTH DAILY BEFORE BREAKFAST, Disp: 90 tablet, Rfl: 2   ferrous sulfate  (FEROSUL) 325 (65 FE) MG tablet, Take 1 tablet (325 mg total) by mouth daily., Disp: 90 tablet, Rfl: 1   finasteride  (PROSCAR ) 5 MG tablet, Take 5 mg by mouth daily., Disp: , Rfl:  fluticasone  (FLONASE ) 50 MCG/ACT nasal spray, Place 2 sprays into both nostrils daily., Disp: 16 g, Rfl: 0   furosemide  (LASIX ) 20 MG tablet, Take 1 tablet (20 mg total) by mouth as needed. (Patient taking differently: Take 1 tablet (20 mg total) by mouth as needed.), Disp: , Rfl:    LUMIGAN 0.01 % SOLN, Place 1 drop into both eyes at bedtime., Disp: , Rfl:    meclizine  (ANTIVERT ) 25 MG tablet, Take 1 tablet (25 mg total) by mouth 3 (three) times daily as needed for dizziness., Disp: 30 tablet, Rfl: 0   SIMBRINZA  1-0.2 % SUSP, Follow up with your PCP on when to restart this medication. (Patient taking differently: Place 1 drop into both eyes 3 (three) times daily.), Disp: , Rfl:    Vitamin D , Ergocalciferol , (DRISDOL) 1.25 MG (50000 UNIT) CAPS capsule, Take 50,000 Units by mouth once a week., Disp: , Rfl:     vitamin D3 (CHOLECALCIFEROL) 25 MCG tablet, Take 1,000 Units by mouth daily., Disp: , Rfl:    No Known Allergies   Men's preventive visit. Patient Health Questionnaire (PHQ-2) is  Flowsheet Row Clinical Support from 12/29/2023 in [facility]  PHQ-2 Total Score 0  . Patient is on a *** diet. Marital status: Married. Relevant history for alcohol  use is:  Social History   Substance and Sexual Activity  Alcohol  Use No   Alcohol /week: 1.0 standard drink of alcohol    Types: 1 Cans of beer per week   Comment: h/o heavy use, quit in 2017  . Relevant history for tobacco use is:  Social History   Tobacco Use  Smoking Status Never  Smokeless Tobacco Never  .   Review of Systems   There were no vitals filed for this visit. There is no height or weight on file to calculate BMI.  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 Patient was given opportunity to ask questions. Patient verbalized understanding of the plan and was able to repeat key elements of the plan. All questions were answered to their satisfaction.   Trevor Ada, FNP  I, Trevor Ada, FNP, have reviewed all documentation for this visit. The documentation on 05/01/24 for the exam, diagnosis, procedures, and orders are all accurate and complete.

## 2024-05-08 ENCOUNTER — Ambulatory Visit: Admitting: Podiatry

## 2024-05-09 NOTE — Progress Notes (Signed)
 Remote Loop Recorder Transmission

## 2024-05-10 ENCOUNTER — Ambulatory Visit (INDEPENDENT_AMBULATORY_CARE_PROVIDER_SITE_OTHER): Admitting: Podiatry

## 2024-05-10 ENCOUNTER — Encounter: Payer: Self-pay | Admitting: Podiatry

## 2024-05-10 DIAGNOSIS — B351 Tinea unguium: Secondary | ICD-10-CM | POA: Diagnosis not present

## 2024-05-10 DIAGNOSIS — N183 Chronic kidney disease, stage 3 unspecified: Secondary | ICD-10-CM | POA: Diagnosis not present

## 2024-05-10 DIAGNOSIS — M79675 Pain in left toe(s): Secondary | ICD-10-CM

## 2024-05-10 DIAGNOSIS — M79674 Pain in right toe(s): Secondary | ICD-10-CM

## 2024-05-10 DIAGNOSIS — I739 Peripheral vascular disease, unspecified: Secondary | ICD-10-CM | POA: Diagnosis not present

## 2024-05-10 NOTE — Progress Notes (Signed)
 Remote Loop Recorder Transmission

## 2024-05-10 NOTE — Progress Notes (Signed)
 This patient returns to my office for at risk foot care.  This patient requires this care by a professional since this patient will be at risk due to having chronic kidney disease and coagulation defect.  Patient is taking eliquis .   This patient is unable to cut nails himself since the patient cannot reach his nails.These nails are painful walking and wearing shoes.  He presents to the office with his sister.  This patient presents for at risk foot care today.  General Appearance  Alert, conversant and in no acute stress.  Vascular  Dorsalis pedis and posterior tibial  pulses are weakly  palpable  bilaterally.  Capillary return is within normal limits  Bilaterally. Cold feet bilaterally. Absent digital hair  B/L.  Neurologic  Senn-Weinstein monofilament wire test within normal limits  bilaterally. Muscle power within normal limits bilaterally.  Nails Thick disfigured discolored nails with subungual debris  from hallux to fifth toes bilaterally. No evidence of bacterial infection or drainage bilaterally.  Orthopedic  No limitations of motion  feet .  No crepitus or effusions noted.  No bony pathology or digital deformities noted.  HAV  B/L.  Skin  normotropic skin with no porokeratosis noted bilaterally.  No signs of infections or ulcers noted.     Onychomycosis  Pain in right toes  Pain in left toes  Consent was obtained for treatment procedures.   Mechanical debridement of nails 1-5  bilaterally performed with a nail nipper.  Filed with dremel without incident.  Cauterized fourth toe left foot.  Use dremel tool only.   Return office visit     10  weeks                Told patient to return for periodic foot care and evaluation due to potential at risk complications.   Cordella Bold DPM [place]

## 2024-05-11 ENCOUNTER — Ambulatory Visit

## 2024-05-11 ENCOUNTER — Ambulatory Visit: Payer: Self-pay | Admitting: Cardiology

## 2024-05-11 DIAGNOSIS — I495 Sick sinus syndrome: Secondary | ICD-10-CM

## 2024-05-11 LAB — CUP PACEART REMOTE DEVICE CHECK
Date Time Interrogation Session: 20251001230051
Implantable Pulse Generator Implant Date: 20201014

## 2024-05-11 NOTE — Progress Notes (Signed)
 Remote Loop Recorder Transmission

## 2024-05-12 ENCOUNTER — Telehealth: Payer: Self-pay

## 2024-05-12 NOTE — Patient Instructions (Signed)
 [name] - I am sorry I was unable to reach you today for our scheduled appointment. I work with [name], FNP and am calling to support your healthcare needs. Please contact me at [phone] at your earliest convenience. I look forward to speaking with you soon.   Thank you,   Clayborne Ly RN BSN CCM [place]  [facility], [facility] Nurse Care Coordinator  Direct Dial: [phone] Website: [email or web address]

## 2024-05-17 DIAGNOSIS — R339 Retention of urine, unspecified: Secondary | ICD-10-CM | POA: Diagnosis not present

## 2024-05-17 DIAGNOSIS — N3281 Overactive bladder: Secondary | ICD-10-CM | POA: Diagnosis not present

## 2024-05-22 ENCOUNTER — Emergency Department (HOSPITAL_COMMUNITY)
Admission: EM | Admit: 2024-05-22 | Discharge: 2024-05-22 | Discharge status: Against Medical Advice | Attending: Emergency Medicine | Admitting: Emergency Medicine

## 2024-05-22 DIAGNOSIS — K5900 Constipation, unspecified: Secondary | ICD-10-CM | POA: Diagnosis not present

## 2024-05-22 DIAGNOSIS — E860 Dehydration: Secondary | ICD-10-CM | POA: Diagnosis not present

## 2024-05-22 DIAGNOSIS — R55 Syncope and collapse: Secondary | ICD-10-CM | POA: Diagnosis not present

## 2024-05-22 DIAGNOSIS — R1111 Vomiting without nausea: Secondary | ICD-10-CM | POA: Diagnosis not present

## 2024-05-22 DIAGNOSIS — R531 Weakness: Secondary | ICD-10-CM | POA: Diagnosis not present

## 2024-05-22 DIAGNOSIS — Z5321 Procedure and treatment not carried out due to patient leaving prior to being seen by health care provider: Secondary | ICD-10-CM | POA: Insufficient documentation

## 2024-05-22 LAB — CBC WITH DIFFERENTIAL/PLATELET
Abs Immature Granulocytes: 0.01 K/uL (ref 0.00–0.07)
Basophils Absolute: 0 K/uL (ref 0.0–0.1)
Basophils Relative: 1 %
Eosinophils Absolute: 0.1 K/uL (ref 0.0–0.5)
Eosinophils Relative: 2 %
HCT: 45 % (ref 39.0–52.0)
Hemoglobin: 14.5 g/dL (ref 13.0–17.0)
Immature Granulocytes: 0 %
Lymphocytes Relative: 15 %
Lymphs Abs: 0.6 K/uL — ABNORMAL LOW (ref 0.7–4.0)
MCH: 31.6 pg (ref 26.0–34.0)
MCHC: 32.2 g/dL (ref 30.0–36.0)
MCV: 98 fL (ref 80.0–100.0)
Monocytes Absolute: 0.5 K/uL (ref 0.1–1.0)
Monocytes Relative: 11 %
Neutro Abs: 3.2 K/uL (ref 1.7–7.7)
Neutrophils Relative %: 71 %
Platelets: 57 K/uL — ABNORMAL LOW (ref 150–400)
RBC: 4.59 MIL/uL (ref 4.22–5.81)
RDW: 18.9 % — ABNORMAL HIGH (ref 11.5–15.5)
WBC: 4.4 K/uL (ref 4.0–10.5)
nRBC: 0 % (ref 0.0–0.2)

## 2024-05-22 LAB — COMPREHENSIVE METABOLIC PANEL WITH GFR
ALT: 22 U/L (ref 0–44)
AST: 63 U/L — ABNORMAL HIGH (ref 15–41)
Albumin: 2.3 g/dL — ABNORMAL LOW (ref 3.5–5.0)
Alkaline Phosphatase: 103 U/L (ref 38–126)
Anion gap: 12 (ref 5–15)
BUN: 13 mg/dL (ref 8–23)
CO2: 20 mmol/L — ABNORMAL LOW (ref 22–32)
Calcium: 7.9 mg/dL — ABNORMAL LOW (ref 8.9–10.3)
Chloride: 100 mmol/L (ref 98–111)
Creatinine, Ser: 1.67 mg/dL — ABNORMAL HIGH (ref 0.61–1.24)
GFR, Estimated: 39 mL/min — ABNORMAL LOW (ref >60)
Glucose, Bld: 117 mg/dL — ABNORMAL HIGH (ref 70–99)
Potassium: 3.1 mmol/L — ABNORMAL LOW (ref 3.5–5.1)
Sodium: 132 mmol/L — ABNORMAL LOW (ref 135–145)
Total Bilirubin: 3.6 mg/dL — ABNORMAL HIGH (ref 0.0–1.2)
Total Protein: 6.8 g/dL (ref 6.5–8.1)

## 2024-05-22 NOTE — ED Notes (Signed)
 PIV removed by nursing staff.

## 2024-05-22 NOTE — ED Triage Notes (Signed)
 Pt BIB [facility] from home for generlaized weakness with NV and increased constipation.  Hx of bladder cnacer and cirrhosis.  PT is HOH and has visual impairment.  A&O. Family in [place].     ETC02 32, 152/60 HR 78 CBG 172,   20G l. AC, 600 ML LR given.

## 2024-05-22 NOTE — ED Notes (Signed)
 Pt stated that he felt better since his last bowl movement and would like to go home. Pt family stated that they will drive pt home and that they are not willing to wait any longer. Pt left AMA.

## 2024-05-22 NOTE — ED Provider Triage Note (Signed)
 Emergency Medicine Provider Triage Evaluation Note  Trevor Murphy , a 89 y.o. male  was evaluated in triage.  Pt complains of syncopal episode that occurred after the patient had a bowel movement at home, evidently he had not had a bowel movement in several days, had a very large bowel movement and he got lightheaded and passed out, his family at home called the paramedics to bring him to the hospital, he has no symptoms at this time.  He has had a Foley catheter for the last week.  Review of Systems  Positive: Syncope Negative: Fevers chills abdominal pain chest pain shortness of breath  Physical Exam  BP (!) 141/67 (BP Location: Left Arm)   Pulse 65   Temp 97.9 F (36.6 C)   Resp 14   SpO2 99%  Gen:   Awake, no distress   Resp:  Normal effort  MSK:   Moves extremities without difficulty  Other:  Foley catheter in the bladder, abdomen soft and nontender, mental status is awake and able to follow commands  Medical Decision Making  Medically screening exam initiated at 4:31 PM.  Appropriate orders placed.  Trevor Murphy was informed that the remainder of the evaluation will be completed by another provider, this initial triage assessment does not replace that evaluation, and the importance of remaining in the ED until their evaluation is complete.  EKG, labs, patient has family here, can go to the waiting room to wait as long as he has them, sounds like it was probably vagal related to bowel movement   Trevor Rogue, MD 05/22/24 [phone]

## 2024-05-24 DIAGNOSIS — E611 Iron deficiency: Secondary | ICD-10-CM | POA: Diagnosis not present

## 2024-05-24 DIAGNOSIS — I129 Hypertensive chronic kidney disease with stage 1 through stage 4 chronic kidney disease, or unspecified chronic kidney disease: Secondary | ICD-10-CM | POA: Diagnosis not present

## 2024-05-24 DIAGNOSIS — K7460 Unspecified cirrhosis of liver: Secondary | ICD-10-CM | POA: Diagnosis not present

## 2024-05-24 DIAGNOSIS — I509 Heart failure, unspecified: Secondary | ICD-10-CM | POA: Diagnosis not present

## 2024-05-24 DIAGNOSIS — N1832 Chronic kidney disease, stage 3b: Secondary | ICD-10-CM | POA: Diagnosis not present

## 2024-05-24 DIAGNOSIS — N2581 Secondary hyperparathyroidism of renal origin: Secondary | ICD-10-CM | POA: Diagnosis not present

## 2024-05-24 DIAGNOSIS — R188 Other ascites: Secondary | ICD-10-CM | POA: Diagnosis not present

## 2024-05-26 ENCOUNTER — Ambulatory Visit: Payer: Medicaid Other

## 2024-05-29 ENCOUNTER — Telehealth: Payer: Self-pay

## 2024-05-29 NOTE — Patient Instructions (Signed)
 Trevor Murphy - I am sorry I was unable to reach you today for our scheduled appointment. I work with [name], FNP and am calling to support your healthcare needs. Please contact me at [phone] at your earliest convenience. I look forward to speaking with you soon.   Thank you,   Clayborne Ly RN BSN CCM [place]  [facility], [facility] Nurse Care Coordinator  Direct Dial: [phone] Website: [email or web address]

## 2024-06-12 ENCOUNTER — Ambulatory Visit

## 2024-06-12 DIAGNOSIS — I495 Sick sinus syndrome: Secondary | ICD-10-CM

## 2024-06-12 LAB — CUP PACEART REMOTE DEVICE CHECK
Date Time Interrogation Session: 20251102230018
Implantable Pulse Generator Implant Date: 20201014

## 2024-06-13 ENCOUNTER — Ambulatory Visit: Payer: Self-pay | Admitting: Cardiology

## 2024-06-14 NOTE — Progress Notes (Signed)
 Remote Loop Recorder Transmission

## 2024-06-19 ENCOUNTER — Other Ambulatory Visit: Payer: Self-pay

## 2024-06-19 NOTE — Patient Instructions (Signed)
 Visit Information  Thank you for taking time to visit with me today. Please don't hesitate to contact me if I can be of assistance to you before our next scheduled appointment.  Your next care management appointment is by telephone on Wednesday, December 3 at 12:30 PM  Please call the care guide team at [phone] if you need to cancel, schedule, or reschedule an appointment.   Please call 1-800-273-TALK (toll free, 24 hour hotline) if you are experiencing a Mental Health or Behavioral Health Crisis or need someone to talk to.  Clayborne Ly RN BSN CCM [employer]  [facility], [facility] Health Nurse Care Coordinator  Direct Dial: [phone] Website: [email or web address]

## 2024-06-19 NOTE — Patient Outreach (Signed)
 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  I obtained verbal consent from Patient.  Visit completed with Caregiver/sister [name] on the phone.  Background:   Past Medical History:  Diagnosis Date   Acute blood loss anemia 08/11/2020   Acute on chronic renal failure 07/27/2012   Alcohol  abuse 01/19/2014   Arthritis    CAD (coronary artery disease)    a. Cath 08/2009: minimal nonobstructive disease (25% LAD). b. Lexiscan  nuc 6/215: EF 54%, no ischemia or infarction.   Chronic diastolic CHF (congestive heart failure) (HCC)    a. Echo 2013: EF 55-60%, mild AI, mild MR. b. Echo 5/15 with EF 60-65%, no WMA, mild LVH, mild AI.   CKD (chronic kidney disease) stage 3, GFR 30-59 ml/min (HCC) 07/26/2012   a. Felt likely due to HTN.   CVA (cerebral infarction) 07/28/2012   Fall 07/05/2023   Fracture of inferior pubic ramus with routine healing 07/05/2023   Glaucoma    Gout    H/O umbilical hernia repair    Headache(784.0)     WHEN MY BLOOD PRESSURE IS UP   Hiatal hernia 09/23/2022   History of acute renal failure    History of alcohol  abuse    HTN (hypertension)    Hyperkalemia 01/27/2023   Hypertension    a. Patent renal arteries 2005.   Noncompliance    OA (osteoarthritis)    Other and unspecified hyperlipidemia    Other ascites 09/23/2022   Poor circulation    Sinus bradycardia    SVT (supraventricular tachycardia)    a. Possible AVNRT.  Got adenosine  from EMS in 2015 for SVT.  During 5/15 hospitalization had episode of short R-P tachycardia (possible AVNRT) terminated by adenosine .  b. 04/2016 - recurrent tachycardia (SVT vs. AVNRT), again terminated by Adenosine    Symptomatic  bradycardia 02/02/2023   Syncope 01/27/2023   Visual impairment    Weakness 09/23/2022    Assessment: Patient Reported Symptoms:  Cognitive Cognitive Status: No symptoms reported (completed call with sister [name])      Neurological Neurological Review of Symptoms: No symptoms reported    HEENT HEENT Symptoms Reported: No symptoms reported      Cardiovascular Cardiovascular Symptoms Reported: No symptoms reported    Respiratory Respiratory Symptoms Reported: No symptoms reported    Endocrine Endocrine Symptoms Reported: No symptoms reported Is patient diabetic?: No    Gastrointestinal Gastrointestinal Symptoms Reported: Incontinence Gastrointestinal Management Strategies: Incontinence garment/pad Gastrointestinal Self-Management Outcome: 3 (uncertain)    Genitourinary Genitourinary Symptoms Reported: No symptoms reported Genitourinary Management Strategies: Catheter, indwelling  Integumentary Integumentary Symptoms Reported: No symptoms reported    Musculoskeletal Musculoskelatal Symptoms Reviewed: Difficulty walking, Limited mobility, Weakness, Unsteady gait Musculoskeletal Management Strategies: Adequate rest, Medication therapy, Routine screening Musculoskeletal Self-Management Outcome: 4 (good) Falls in the past year?: Yes Number of falls in past year: 1 or less Was there an injury with Fall?: Yes Fall Risk Category Calculator: 2 Patient Fall Risk Level: Moderate Fall Risk Patient at Risk for Falls Due to: History of fall(s), Impaired balance/gait, Impaired mobility, Impaired vision Fall risk Follow up: Falls evaluation completed, Education provided  Psychosocial Psychosocial Symptoms Reported: No symptoms reported   Major Change/Loss/Stressor/Fears (CP): Denies Quality of Family Relationships: involved, supportive, helpful Do you  feel physically threatened by others?: No    06/19/2024    PHQ2-9 Depression Screening   Kerigan Narvaez interest or pleasure in doing things     Feeling down, depressed, or hopeless    PHQ-2 - Total Score    Trouble falling or staying asleep, or sleeping too much    Feeling tired or having Hazelle Woollard energy    Poor appetite or overeating     Feeling bad about yourself - or that you are a failure or have let yourself or your family down    Trouble concentrating on things, such as reading the newspaper or watching television    Moving or speaking so slowly that other people could have noticed.  Or the opposite - being so fidgety or restless that you have been moving around a lot more than usual    Thoughts that you would be better off dead, or hurting yourself in some way    PHQ2-9 Total Score    If you checked off any problems, how difficult have these problems made it for you to do your work, take care of things at home, or get along with other people    Depression Interventions/Treatment      There were no vitals filed for this visit.  Medications Reviewed Today     Reviewed by [name], RN (Registered Nurse) on 06/19/24 at 1453  Med List Status: <None>   Medication Order Taking? Sig Documenting Provider Last Dose Status Informant  acetaminophen  (TYLENOL ) 500 MG tablet [id number] No Take 1,000 mg by mouth 2 (two) times daily as needed for mild pain or moderate pain (leg pain). [provider] Taking Active Family Member  albuterol  (VENTOLIN  HFA) 108 [phone] Base) MCG/ACT inhaler [id number] No INHALE 1 TO 2 PUFFS INTO THE LUNGS EVERY 6 HOURS AS NEEDED FOR WHEEZING OR SHORTNESS OF [name] [name], NP Taking Active   carvedilol  (COREG ) 3.125 MG tablet [id number] No Take 1 tablet (3.125 mg total) by mouth 2 (two) times daily with a meal. [name], FNP Taking Expired 11/29/23 2359   cephALEXin  (KEFLEX ) 500 MG capsule [id number]  Take 1 capsule (500 mg total) by mouth 4 (four) times daily. Mannie Pac T, DO  Active   [name]  10 MG TABS tablet [id number]  TAKE 1 TABLET(10 MG) BY MOUTH DAILY BEFORE [name] [name],  FNP  Active   ferrous sulfate  (FEROSUL) 325 (65 FE) MG tablet [id number]  Take 1 tablet (325 mg total) by mouth daily. [name], FNP  Active   finasteride  (PROSCAR ) 5 MG tablet [id number] No Take 5 mg by mouth daily. [provider] Taking Active Family Member  fluticasone  (FLONASE ) 50 MCG/ACT nasal spray [id number] No Place 2 sprays into both nostrils daily. Alva Larraine FALCON, PA-C Taking Active   furosemide  (LASIX ) 20 MG tablet [id number]  Take 1 tablet (20 mg total) by mouth as needed.  Patient taking differently: Take 1 tablet (20 mg total) by mouth as needed.   Thukkani, Arun K, MD  Active   LUMIGAN 0.01 % SOLN [id number] No Place 1 drop into both eyes at bedtime. [provider] Taking Active Family Member           Med Note [place], DONETA GORMAN [place] Jan 27, 2023  9:42 PM) Extra drop placed today, due to blurry vision  meclizine  (ANTIVERT ) 25 MG tablet [id number] No Take 1 tablet (25 mg total) by mouth 3 (three) times daily as needed for dizziness. Kehrli, Kelsey F, PA-C Taking  Active   SIMBRINZA  1-0.2 % SUSP [id number] No Follow up with your PCP on when to restart this medication.  Patient taking differently: Place 1 drop into both eyes 3 (three) times daily.   Briana Elgin LABOR, MD Taking Active Family Member  Vitamin D , Ergocalciferol , (DRISDOL) 1.25 MG (50000 UNIT) CAPS capsule [id number] No Take 50,000 Units by mouth once a week. [provider] Taking Active   vitamin D3 (CHOLECALCIFEROL) 25 MCG tablet [id number] No Take 1,000 Units by mouth daily. [provider] Taking Active Family Member  Med List Note Isabel Doneta GORMAN Bishop 01/27/23 2146): Sister, [place], assists patient with medication            Recommendation:   PCP Follow-up Specialty provider follow-up with [facility] as directed for foley catheter changes   Follow Up Plan:   Telephone follow up appointment date/time:  Wednesday, December 3 at 12:30 PM  Clayborne Ly RN BSN CCM [facility]  [facility], [facility] Health Nurse Care Coordinator  Direct Dial: [phone] Website: [email or web address]

## 2024-06-29 ENCOUNTER — Ambulatory Visit (INDEPENDENT_AMBULATORY_CARE_PROVIDER_SITE_OTHER): Payer: Self-pay | Admitting: Nurse Practitioner

## 2024-06-29 ENCOUNTER — Encounter: Payer: Self-pay | Admitting: Nurse Practitioner

## 2024-06-29 VITALS — BP 120/60 | HR 73 | Temp 97.4°F | Ht 70.0 in | Wt 139.8 lb

## 2024-06-29 DIAGNOSIS — Z2821 Immunization not carried out because of patient refusal: Secondary | ICD-10-CM

## 2024-06-29 DIAGNOSIS — Z0000 Encounter for general adult medical examination without abnormal findings: Secondary | ICD-10-CM

## 2024-06-29 DIAGNOSIS — D509 Iron deficiency anemia, unspecified: Secondary | ICD-10-CM

## 2024-06-29 DIAGNOSIS — I130 Hypertensive heart and chronic kidney disease with heart failure and stage 1 through stage 4 chronic kidney disease, or unspecified chronic kidney disease: Secondary | ICD-10-CM | POA: Diagnosis not present

## 2024-06-29 DIAGNOSIS — Z136 Encounter for screening for cardiovascular disorders: Secondary | ICD-10-CM

## 2024-06-29 DIAGNOSIS — Z79899 Other long term (current) drug therapy: Secondary | ICD-10-CM

## 2024-06-29 DIAGNOSIS — I5032 Chronic diastolic (congestive) heart failure: Secondary | ICD-10-CM

## 2024-06-29 DIAGNOSIS — Z139 Encounter for screening, unspecified: Secondary | ICD-10-CM

## 2024-06-29 DIAGNOSIS — N1831 Chronic kidney disease, stage 3a: Secondary | ICD-10-CM

## 2024-06-29 DIAGNOSIS — I739 Peripheral vascular disease, unspecified: Secondary | ICD-10-CM

## 2024-06-29 DIAGNOSIS — Z13220 Encounter for screening for lipoid disorders: Secondary | ICD-10-CM

## 2024-06-29 DIAGNOSIS — Z960 Presence of urogenital implants: Secondary | ICD-10-CM

## 2024-06-29 DIAGNOSIS — Z95811 Presence of heart assist device: Secondary | ICD-10-CM

## 2024-06-29 NOTE — Patient Instructions (Signed)
 Health Maintenance  Topic Date Due   DTaP/Tdap/Td vaccine (2 - Td or Tdap) 07/04/2024*   COVID-19 Vaccine (3 - Pfizer risk series) 07/15/2024*   Zoster (Shingles) Vaccine (1 of 2) 09/29/2024*   Flu Shot  11/07/2024*   Pneumococcal Vaccine for age over 19 (1 of 2 - PCV) 12/28/2024*   Medicare Annual Wellness Visit  12/28/2024   Meningitis B Vaccine  Aged Out  *Topic was postponed. The date shown is not the original due date.
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
## 2024-06-30 ENCOUNTER — Ambulatory Visit: Payer: Medicaid Other

## 2024-06-30 LAB — CBC WITH DIFFERENTIAL/PLATELET
Basophils Absolute: 0 x10E3/uL (ref 0.0–0.2)
Basos: 0 %
EOS (ABSOLUTE): 0.1 x10E3/uL (ref 0.0–0.4)
Eos: 1 %
Hematocrit: 42.3 % (ref 37.5–51.0)
Hemoglobin: 14.3 g/dL (ref 13.0–17.7)
Immature Grans (Abs): 0 x10E3/uL (ref 0.0–0.1)
Immature Granulocytes: 0 %
Lymphocytes Absolute: 1.5 x10E3/uL (ref 0.7–3.1)
Lymphs: 32 %
MCH: 31.8 pg (ref 26.6–33.0)
MCHC: 33.8 g/dL (ref 31.5–35.7)
MCV: 94 fL (ref 79–97)
Monocytes Absolute: 0.4 x10E3/uL (ref 0.1–0.9)
Monocytes: 9 %
Neutrophils Absolute: 2.8 x10E3/uL (ref 1.4–7.0)
Neutrophils: 58 %
Platelets: 69 x10E3/uL — CL (ref 150–450)
RBC: 4.49 x10E6/uL (ref 4.14–5.80)
RDW: 15.9 % — ABNORMAL HIGH (ref 11.6–15.4)
WBC: 4.7 x10E3/uL (ref 3.4–10.8)

## 2024-06-30 LAB — CMP14+EGFR
ALT: 12 IU/L (ref 0–44)
AST: 32 IU/L (ref 0–40)
Albumin: 2.6 g/dL — ABNORMAL LOW (ref 3.7–4.7)
Alkaline Phosphatase: 118 IU/L (ref 48–129)
BUN/Creatinine Ratio: 9 — ABNORMAL LOW (ref 10–24)
BUN: 13 mg/dL (ref 8–27)
Bilirubin Total: 2.3 mg/dL — ABNORMAL HIGH (ref 0.0–1.2)
CO2: 19 mmol/L — ABNORMAL LOW (ref 20–29)
Calcium: 8.2 mg/dL — ABNORMAL LOW (ref 8.6–10.2)
Chloride: 106 mmol/L (ref 96–106)
Creatinine, Ser: 1.46 mg/dL — ABNORMAL HIGH (ref 0.76–1.27)
Globulin, Total: 4.1 g/dL (ref 1.5–4.5)
Glucose: 107 mg/dL — ABNORMAL HIGH (ref 70–99)
Potassium: 3.8 mmol/L (ref 3.5–5.2)
Sodium: 139 mmol/L (ref 134–144)
Total Protein: 6.7 g/dL (ref 6.0–8.5)
eGFR: 46 mL/min/1.73 — ABNORMAL LOW (ref >59)

## 2024-06-30 LAB — HEMOGLOBIN A1C
Est. average glucose Bld gHb Est-mCnc: 91 mg/dL
Hgb A1c MFr Bld: 4.8 % (ref 4.8–5.6)

## 2024-06-30 LAB — LIPID PANEL
Chol/HDL Ratio: 2.2 ratio (ref 0.0–5.0)
Cholesterol, Total: 82 mg/dL — ABNORMAL LOW (ref 100–199)
HDL: 38 mg/dL — ABNORMAL LOW (ref >39)
LDL Chol Calc (NIH): 30 mg/dL (ref 0–99)
Triglycerides: 63 mg/dL (ref 0–149)
VLDL Cholesterol Cal: 14 mg/dL (ref 5–40)

## 2024-07-12 ENCOUNTER — Telehealth

## 2024-07-12 ENCOUNTER — Ambulatory Visit: Payer: Self-pay | Admitting: Nurse Practitioner

## 2024-07-12 DIAGNOSIS — Z960 Presence of urogenital implants: Secondary | ICD-10-CM | POA: Insufficient documentation

## 2024-07-12 NOTE — Assessment & Plan Note (Signed)
 Chronic Foley catheter in place for bladder outlet obstruction. Reports dysuria and burning sensation, indicating possible UTI. Catheter changed monthly, last on November 3rd, 2025. No recent urine culture. - Obtain urine sample from new catheter bag for culture. - Bring urine sample to clinic for analysis. - Change catheter bag every two weeks. - Monitor for infection or catheter blockage.

## 2024-07-12 NOTE — Assessment & Plan Note (Signed)
 Blood pressure is well controlled, continue current medications and f/u with Cardiology

## 2024-07-12 NOTE — Assessment & Plan Note (Signed)
 Continues with iron supplement, will recheck levels

## 2024-07-12 NOTE — Assessment & Plan Note (Signed)
 Continue f/u with Cardiology. No current issues, no edema noted

## 2024-07-12 NOTE — Assessment & Plan Note (Signed)
Behavior modifications discussed and diet history reviewed.   Pt will continue to exercise regularly and modify diet with low GI, plant based foods and decrease intake of processed foods.  Recommend intake of daily multivitamin, Vitamin D, and calcium.  Recommend colonoscopy for preventive screenings, as well as recommend immunizations that include influenza, TDAP, and Shingles (declines)

## 2024-07-12 NOTE — Assessment & Plan Note (Signed)
 Continue statin, tolerating well

## 2024-07-13 ENCOUNTER — Ambulatory Visit

## 2024-07-14 ENCOUNTER — Ambulatory Visit

## 2024-07-14 DIAGNOSIS — I480 Paroxysmal atrial fibrillation: Secondary | ICD-10-CM

## 2024-07-14 LAB — CUP PACEART REMOTE DEVICE CHECK
Date Time Interrogation Session: 20251204230414
Implantable Pulse Generator Implant Date: 20201014

## 2024-07-18 NOTE — Progress Notes (Signed)
 Remote Loop Recorder Transmission

## 2024-07-19 ENCOUNTER — Ambulatory Visit: Payer: Self-pay | Admitting: Cardiology

## 2024-08-01 ENCOUNTER — Telehealth: Payer: Self-pay

## 2024-08-01 NOTE — Patient Instructions (Signed)
 Visit Information  Thank you for taking time to visit with me today. Please don't hesitate to contact me if I can be of assistance to you before our next scheduled appointment.  Your next care management appointment is by telephone on Friday, December 26 at 10:00 AM  Please call the care guide team at [phone] if you need to cancel, schedule, or reschedule an appointment.   Please call 1-800-273-TALK (toll free, 24 hour hotline) if you are experiencing a Mental Health or Behavioral Health Crisis or need someone to talk to.  Clayborne Ly RN BSN CCM [place]  [facility], [facility], [place] [facility] Nurse Care Coordinator  Direct Dial: [phone] Website: [email or web address]

## 2024-08-01 NOTE — Patient Outreach (Signed)
 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 I obtained verbal consent from Patient.  Visit completed with Patient on the phone.  Background:   Past Medical History:  Diagnosis Date   Acute blood loss anemia 08/11/2020   Acute on chronic renal failure 07/27/2012   Alcohol  abuse 01/19/2014   Arthritis    CAD (coronary artery disease)    a. Cath 08/2009: minimal nonobstructive disease (25% LAD). b. Lexiscan  nuc 6/215: EF 54%, no ischemia or infarction.   Chronic diastolic CHF (congestive heart failure) (HCC)    a. Echo 2013: EF 55-60%, mild AI, mild MR. b. Echo 5/15 with EF 60-65%, no WMA, mild LVH, mild AI.   CKD (chronic kidney disease) stage 3, GFR 30-59 ml/min (HCC) 07/26/2012   a. Felt likely due to HTN.   CVA (cerebral infarction) 07/28/2012   Fall 07/05/2023   Fracture of inferior pubic ramus with routine healing 07/05/2023   Glaucoma    Gout    H/O umbilical hernia repair    Headache(784.0)     WHEN MY BLOOD PRESSURE IS UP   Hiatal hernia 09/23/2022   History of acute renal failure    History of alcohol  abuse    HTN (hypertension)    Hyperkalemia 01/27/2023   Hypertension    a. Patent renal arteries 2005.   Noncompliance    OA (osteoarthritis)    Other and unspecified hyperlipidemia    Other ascites 09/23/2022   Poor circulation    Sinus bradycardia    SVT (supraventricular tachycardia)    a. Possible AVNRT.  Got adenosine  from EMS in 2015 for SVT.  During 5/15 hospitalization had episode of short R-P tachycardia (possible AVNRT) terminated by adenosine .  b. 04/2016 - recurrent tachycardia (SVT vs. AVNRT), again terminated by Adenosine    Symptomatic bradycardia 02/02/2023    Syncope 01/27/2023   Visual impairment    Weakness 09/23/2022    Assessment: Patient Reported Symptoms:  Cognitive Cognitive Status: No symptoms reported (completed call with wife [name]) Cognitive/Intellectual Conditions Management [RPT]: None reported or documented in medical history or problem list   Health Maintenance Behaviors: Annual physical exam Health Facilitated by: Rest  Neurological Neurological Review of Symptoms: Not assessed    HEENT HEENT Symptoms Reported: Not assessed      Cardiovascular Cardiovascular Symptoms Reported: Not assessed    Respiratory Respiratory Symptoms Reported: Not assesed    Endocrine Endocrine Symptoms Reported: Not assessed    Gastrointestinal Gastrointestinal Symptoms Reported: Incontinence, Change in appetite Gastrointestinal Management Strategies: Incontinence garment/pad Gastrointestinal Self-Management Outcome: 3 (uncertain)    Genitourinary Genitourinary Symptoms Reported: Incontinence, Other Other Genitourinary Symptoms: Urinary retention Additional Genitourinary Details: sister [name] reports patient is leaking urine from his Foley Catheter, reports having decreased urine output; Instructed to take patient to the ED for evaluation if [facility] is unable to evaluate in office today Genitourinary Management Strategies: Catheter, indwelling Genitourinary Self-Management Outcome: 2 (bad)  Integumentary Integumentary Symptoms Reported: Not assessed    Musculoskeletal Musculoskelatal Symptoms Reviewed: Limited mobility, Difficulty walking, Weakness Additional Musculoskeletal Details: sister [name] reports patient is bedbound for the past couple of weeks, family is assisting with tansfers Musculoskeletal Management Strategies: Adequate rest Musculoskeletal Self-Management Outcome: 3 (uncertain)      Psychosocial Psychosocial Symptoms Reported:  Alteration in eating habits, Alteration in sleep habits (sleeping more)     Quality of  Family Relationships: involved, helpful, supportive Do you feel physically threatened by others?: No    08/01/2024    PHQ2-9 Depression Screening   Hikari Tripp interest or pleasure in doing things    Feeling down, depressed, or hopeless    PHQ-2 - Total Score    Trouble falling or staying asleep, or sleeping too much    Feeling tired or having Ayleen Mckinstry energy    Poor appetite or overeating     Feeling bad about yourself - or that you are a failure or have let yourself or your family down    Trouble concentrating on things, such as reading the newspaper or watching television    Moving or speaking so slowly that other people could have noticed.  Or the opposite - being so fidgety or restless that you have been moving around a lot more than usual    Thoughts that you would be better off dead, or hurting yourself in some way    PHQ2-9 Total Score    If you checked off any problems, how difficult have these problems made it for you to do your work, take care of things at home, or get along with other people    Depression Interventions/Treatment      There were no vitals filed for this visit. Pain Scale: Not given for pain  Medications Reviewed Today     Reviewed by [name], RN (Registered Nurse) on 08/01/24 at 1144  Med List Status: <None>   Medication Order Taking? Sig Documenting Provider Last Dose Status Informant  acetaminophen  (TYLENOL ) 500 MG tablet [id number]  Take 1,000 mg by mouth 2 (two) times daily as needed for mild pain or moderate pain (leg pain). [provider]  Active Family Member  albuterol  (VENTOLIN  HFA) 108 (90 Base) MCG/ACT inhaler [id number]  INHALE 1 TO 2 PUFFS INTO THE LUNGS EVERY 6 HOURS AS NEEDED FOR WHEEZING OR SHORTNESS OF [name] [name], NP  Active   carvedilol  (COREG ) 3.125 MG tablet [id number]  Take 1 tablet (3.125 mg total) by mouth 2 (two) times daily with a meal. [name], FNP  Expired 06/29/24 2359   cephALEXin  (KEFLEX ) 500 MG capsule  [id number]  Take 1 capsule (500 mg total) by mouth 4 (four) times daily. Mannie Fairy DASEN, DO  Active   [name]  10 MG TABS tablet [id number]  TAKE 1 TABLET(10 MG) BY MOUTH DAILY BEFORE [name] [name], FNP  Active   ferrous sulfate  (FEROSUL) 325 (65 FE) MG tablet [id number]  Take 1 tablet (325 mg total) by mouth daily. [name], FNP  Active   finasteride  (PROSCAR ) 5 MG tablet [id number]  Take 5 mg by mouth daily. [provider]  Active Family Member  fluticasone  (FLONASE ) 50 MCG/ACT nasal spray [id number]  Place 2 sprays into both nostrils daily. Kehrli, Kelsey F, PA-C  Active   furosemide  (LASIX ) 20 MG tablet [id number]  Take 1 tablet (20 mg total) by mouth as needed. Thukkani, Arun K, MD  Active   LUMIGAN 0.01 % SOLN [id number]  Place 1 drop into both eyes at bedtime. [provider]  Active Family Member           Med Note (WHITE, DONETA GORMAN [place] Jan 27, 2023  9:42 PM) Extra drop placed today, due to blurry vision  meclizine  (ANTIVERT ) 25 MG tablet [id number]  Take 1 tablet (25 mg total) by mouth  3 (three) times daily as needed for dizziness. Alva Larraine FALCON, PA-C  Active   SIMBRINZA  1-0.2 % SUSP [id number]  Follow up with your PCP on when to restart this medication.  Patient taking differently: Place 1 drop into both eyes 3 (three) times daily.   Briana Elgin LABOR, MD  Active Family Member  Vitamin D , Ergocalciferol , (DRISDOL) 1.25 MG (50000 UNIT) CAPS capsule [id number]  Take 50,000 Units by mouth once a week. [provider]  Active   vitamin D3 (CHOLECALCIFEROL) 25 MCG tablet [id number]  Take 1,000 Units by mouth daily. [provider]  Active Family Member  Med List Note Isabel Doneta GORMAN Bishop 01/27/23 2146): Sister, [place], assists patient with medication            Recommendation:   Specialty provider follow-up with [facility] re: foley catheter dysfunction and or call 911 to have patient transported to the nearest ED for further evaluation    Follow Up Plan:   Telephone follow up appointment date/time:     08/04/2024 Status: Sch   Time: 10:00 AM Length: 30  Visit Type: VBCI TELEPHONE CALL 30 [2502] Copay: $0.00  Provider: [name], RN Department: [facility]    Clayborne Morgan RN BSN CCM   [facility], [facility] Nurse Care Coordinator  Direct Dial: [phone] Website: [email or web address]

## 2024-08-04 ENCOUNTER — Ambulatory Visit: Payer: Medicaid Other

## 2024-08-04 ENCOUNTER — Other Ambulatory Visit: Payer: Self-pay

## 2024-08-04 NOTE — Patient Instructions (Signed)
 Visit Information  Thank you for taking time to visit with me today. Please don't hesitate to contact me if I can be of assistance to you before our next scheduled appointment.  Your next care management appointment is by telephone on Thursday, January 8 at 3:00 PM  Please call the care guide team at [phone] if you need to cancel, schedule, or reschedule an appointment.   Please call 1-800-273-TALK (toll free, 24 hour hotline) if you are experiencing a Mental Health or Behavioral Health Crisis or need someone to talk to.  Clayborne Ly RN BSN CCM [place]  [facility], [facility] Nurse Care Coordinator  Direct Dial: [phone] Website: [email or web address]

## 2024-08-04 NOTE — Patient Outreach (Addendum)
 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 I obtained verbal consent from Caregiver/sister [name].  Visit completed with Caregiver/sister [name] on the phone.  Background:   Past Medical History:  Diagnosis Date   Acute blood loss anemia 08/11/2020   Acute on chronic renal failure 07/27/2012   Alcohol  abuse 01/19/2014   Arthritis    CAD (coronary artery disease)    a. Cath 08/2009: minimal nonobstructive disease (25% LAD). b. Lexiscan  nuc 6/215: EF 54%, no ischemia or infarction.   Chronic diastolic CHF (congestive heart failure) (HCC)    a. Echo 2013: EF 55-60%, mild AI, mild MR. b. Echo 5/15 with EF 60-65%, no WMA, mild LVH, mild AI.   CKD (chronic kidney disease) stage 3, GFR 30-59 ml/min (HCC) 07/26/2012   a. Felt likely due to HTN.   CVA (cerebral infarction) 07/28/2012   Fall 07/05/2023   Fracture of inferior pubic ramus with routine healing 07/05/2023   Glaucoma    Gout    H/O umbilical hernia repair    Headache(784.0)     WHEN MY BLOOD PRESSURE IS UP   Hiatal hernia 09/23/2022   History of acute renal failure    History of alcohol  abuse    HTN (hypertension)    Hyperkalemia 01/27/2023   Hypertension    a. Patent renal arteries 2005.   Noncompliance    OA (osteoarthritis)    Other and unspecified hyperlipidemia    Other ascites 09/23/2022   Poor circulation    Sinus bradycardia    SVT (supraventricular tachycardia)    a. Possible AVNRT.  Got adenosine  from EMS in 2015 for SVT.  During 5/15 hospitalization had episode of short R-P tachycardia (possible AVNRT) terminated by adenosine .  b. 04/2016 - recurrent tachycardia (SVT vs. AVNRT), again terminated by Adenosine    Symptomatic  bradycardia 02/02/2023   Syncope 01/27/2023   Visual impairment    Weakness 09/23/2022    Assessment: Patient Reported Symptoms:  Cognitive Cognitive Status: No symptoms reported (completed call with sister [name]) Cognitive/Intellectual Conditions Management [RPT]: None reported or documented in medical history or problem list   Health Maintenance Behaviors: Annual physical exam Health Facilitated by: Rest  Neurological Neurological Review of Symptoms: No symptoms reported    HEENT HEENT Symptoms Reported: No symptoms reported      Cardiovascular Cardiovascular Symptoms Reported: No symptoms reported Does patient have uncontrolled Hypertension?: No Cardiovascular Management Strategies: Routine screening, Medication therapy Cardiovascular Self-Management Outcome: 4 (good)  Respiratory Respiratory Symptoms Reported: No symptoms reported    Endocrine Endocrine Symptoms Reported: No symptoms reported Is patient diabetic?: No    Gastrointestinal Gastrointestinal Symptoms Reported: Change in appetite, Incontinence Gastrointestinal Management Strategies: Incontinence garment/pad Gastrointestinal Self-Management Outcome: 3 (uncertain)    Genitourinary Genitourinary Symptoms Reported: Incontinence, Other Other Genitourinary Symptoms: Urinary retention Additional Genitourinary Details: sister [name] reports patient has a scheduled urology appt on 08/11/24 Genitourinary Management Strategies: Catheter, indwelling Genitourinary Self-Management Outcome: 3 (uncertain)  Integumentary Integumentary Symptoms Reported: Not assessed    Musculoskeletal Musculoskelatal Symptoms Reviewed: Weakness, Difficulty walking, Limited mobility Additional Musculoskeletal Details: sister [name] states patient is now bedbound, he is non-ambulatory and unable to self-transfer Musculoskeletal Management Strategies: Routine screening, Adequate rest Musculoskeletal Self-Management Outcome: 2 (bad) Musculoskeletal  Comment: family is having lift  patient from his bed to his chair      Psychosocial Psychosocial Symptoms Reported: Alteration in eating habits, Alteration in sleep habits Behavioral Management Strategies: Support system, Adequate rest Behavioral Health Self-Management Outcome: 2 (bad) Major Change/Loss/Stressor/Fears (CP): Denies Quality of Family Relationships: involved, supportive, helpful Do you feel physically threatened by others?: No    08/04/2024    PHQ2-9 Depression Screening   Tarynn Garling interest or pleasure in doing things    Feeling down, depressed, or hopeless    PHQ-2 - Total Score    Trouble falling or staying asleep, or sleeping too much    Feeling tired or having Tray Klayman energy    Poor appetite or overeating     Feeling bad about yourself - or that you are a failure or have let yourself or your family down    Trouble concentrating on things, such as reading the newspaper or watching television    Moving or speaking so slowly that other people could have noticed.  Or the opposite - being so fidgety or restless that you have been moving around a lot more than usual    Thoughts that you would be better off dead, or hurting yourself in some way    PHQ2-9 Total Score    If you checked off any problems, how difficult have these problems made it for you to do your work, take care of things at home, or get along with other people    Depression Interventions/Treatment      There were no vitals filed for this visit. Pain Scale: Not given for pain  Medications Reviewed Today     Reviewed by [name], RN (Registered Nurse) on 08/04/24 at 1022  Med List Status: <None>   Medication Order Taking? Sig Documenting Provider Last Dose Status Informant  acetaminophen  (TYLENOL ) 500 MG tablet [id number]  Take 1,000 mg by mouth 2 (two) times daily as needed for mild pain or moderate pain (leg pain). [provider]  Active Family Member  albuterol  (VENTOLIN  HFA) 108 (90 Base)  MCG/ACT inhaler [id number]  INHALE 1 TO 2 PUFFS INTO THE LUNGS EVERY 6 HOURS AS NEEDED FOR WHEEZING OR SHORTNESS OF [name] [name], NP  Active   carvedilol  (COREG ) 3.125 MG tablet [id number]  Take 1 tablet (3.125 mg total) by mouth 2 (two) times daily with a meal. [name], FNP  Expired 06/29/24 2359   cephALEXin  (KEFLEX ) 500 MG capsule [id number]  Take 1 capsule (500 mg total) by mouth 4 (four) times daily. Mannie Pac T, DO  Active   [name]  10 MG TABS tablet [id number]  TAKE 1 TABLET(10 MG) BY MOUTH DAILY BEFORE [name] [name], FNP  Active   ferrous sulfate  (FEROSUL) 325 (65 FE) MG tablet [id number]  Take 1 tablet (325 mg total) by mouth daily. [name], FNP  Active   finasteride  (PROSCAR ) 5 MG tablet [id number]  Take 5 mg by mouth daily. [provider]  Active Family Member  fluticasone  (FLONASE ) 50 MCG/ACT nasal spray [id number]  Place 2 sprays into both nostrils daily. Kehrli, Kelsey F, PA-C  Active   furosemide  (LASIX ) 20 MG tablet [id number]  Take 1 tablet (20 mg total) by mouth as needed. Thukkani, Arun K, MD  Active   LUMIGAN 0.01 % SOLN [id number]  Place 1 drop into both eyes at bedtime. [provider]  Active Family Member           Med Note [place], DONETA GORMAN [place] Jan 27, 2023  9:42 PM) Extra drop placed today, due to blurry vision  meclizine  (ANTIVERT ) 25 MG tablet [id number]  Take 1 tablet (25 mg total) by mouth 3 (three) times daily as needed for dizziness. Alva Larraine FALCON, PA-C  Active   SIMBRINZA  1-0.2 % SUSP [id number]  Follow up with your PCP on when to restart this medication.  Patient taking differently: Place 1 drop into both eyes 3 (three) times daily.   Briana Elgin LABOR, MD  Active Family Member  Vitamin D , Ergocalciferol , (DRISDOL) 1.25 MG (50000 UNIT) CAPS capsule [id number]  Take 50,000 Units by mouth once a week. [provider]  Active   vitamin D3 (CHOLECALCIFEROL) 25 MCG tablet [id number]  Take 1,000 Units by mouth daily.  [provider]  Active Family Member  Med List Note Isabel Doneta GORMAN [place] 01/27/23 2146): Sister, [place], assists patient with medication            Recommendation:   Specialty provider follow-up   Specialty provider follow-up with [facility] re: foley catheter dysfunction and or call 911 to have patient transported to the nearest ED for further evaluation    08/11/2024 Status: Sch   Time: 9:45 AM Length: 15  Visit Type: ROUTINE FOOT CARE [382] Copay: $0.00  Provider: Loreda Hacker, DPM Department: [facility]   Follow Up Plan:    Telephone follow up appointment date/time 08/17/2024 Status: Sch   Time: 3:00 PM Length: 30  Visit Type: VBCI TELEPHONE CALL 30 [2502] Copay: $0.00  Provider: [name], RN Department: [facility]   Clayborne Morgan RN BSN CCM [place]  [facility], [facility] Nurse Care Coordinator  Direct Dial: [phone] Website: [email or web address]

## 2024-08-07 ENCOUNTER — Telehealth: Payer: Self-pay

## 2024-08-07 NOTE — Telephone Encounter (Signed)
 Copied from CRM [id number]. Topic: General - Other >> Aug 07, 2024 10:56 AM Hadassah PARAS wrote: Reason for CRM: Star from [facility] stated pt's sister Denver has declined hospice services at the moment. Any questions please call star on [phone].

## 2024-08-11 ENCOUNTER — Ambulatory Visit: Admitting: Podiatry

## 2024-08-14 ENCOUNTER — Ambulatory Visit

## 2024-08-14 DIAGNOSIS — I480 Paroxysmal atrial fibrillation: Secondary | ICD-10-CM | POA: Diagnosis not present

## 2024-08-15 ENCOUNTER — Ambulatory Visit: Payer: Self-pay | Admitting: Cardiology

## 2024-08-15 LAB — CUP PACEART REMOTE DEVICE CHECK
Date Time Interrogation Session: 20260104230548
Implantable Pulse Generator Implant Date: 20201014

## 2024-08-17 ENCOUNTER — Other Ambulatory Visit: Payer: Self-pay

## 2024-08-17 NOTE — Patient Instructions (Signed)
 [name] - I am sorry I was unable to reach you today for our scheduled appointment.   Your next care management appointment is by telephone on Wednesday, January 21 at 12:00 PM  Please call the care guide team at [phone] if you need to cancel, schedule, or reschedule an appointment.   Please call 1-800-273-TALK (toll free, 24 hour hotline) if you are experiencing a Mental Health or Behavioral Health Crisis or need someone to talk to.   Thank you,   Clayborne Ly RN BSN CCM [place]  [facility], [facility] Health Nurse Care Coordinator  Direct Dial: [phone] Website: [email or web address]

## 2024-08-17 NOTE — Patient Instructions (Addendum)
 SABRA

## 2024-08-17 NOTE — Progress Notes (Signed)
 Remote Loop Recorder Transmission

## 2024-08-22 NOTE — Progress Notes (Signed)
 Unable to reach patient/caregiver to assess for goal outcomes.   Clayborne Ly RN BSN CCM [place]  [facility], [facility], [place] [facility] Nurse Care Coordinator  Direct Dial: [phone] Website: [email or web address]

## 2024-08-30 ENCOUNTER — Other Ambulatory Visit: Payer: Self-pay

## 2024-08-30 NOTE — Patient Outreach (Addendum)
 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 I obtained verbal consent from Patient.  Visit completed with Caregiver/sister [name] on the phone.  Background:   Past Medical History:  Diagnosis Date   Acute blood loss anemia 08/11/2020   Acute on chronic renal failure 07/27/2012   Alcohol  abuse 01/19/2014   Arthritis    CAD (coronary artery disease)    a. Cath 08/2009: minimal nonobstructive disease (25% LAD). b. Lexiscan  nuc 6/215: EF 54%, no ischemia or infarction.   Chronic diastolic CHF (congestive heart failure) (HCC)    a. Echo 2013: EF 55-60%, mild AI, mild MR. b. Echo 5/15 with EF 60-65%, no WMA, mild LVH, mild AI.   CKD (chronic kidney disease) stage 3, GFR 30-59 ml/min (HCC) 07/26/2012   a. Felt likely due to HTN.   CVA (cerebral infarction) 07/28/2012   Fall 07/05/2023   Fracture of inferior pubic ramus with routine healing 07/05/2023   Glaucoma    Gout    H/O umbilical hernia repair    Headache(784.0)     WHEN MY BLOOD PRESSURE IS UP   Hiatal hernia 09/23/2022   History of acute renal failure    History of alcohol  abuse    HTN (hypertension)    Hyperkalemia 01/27/2023   Hypertension    a. Patent renal arteries 2005.   Noncompliance    OA (osteoarthritis)    Other and unspecified hyperlipidemia    Other ascites 09/23/2022   Poor circulation    Sinus bradycardia    SVT (supraventricular tachycardia)    a. Possible AVNRT.  Got adenosine  from EMS in 2015 for SVT.  During 5/15 hospitalization had episode of short R-P tachycardia (possible AVNRT) terminated by adenosine .  b. 04/2016 - recurrent tachycardia (SVT vs. AVNRT), again terminated by  Adenosine    Symptomatic bradycardia 02/02/2023   Syncope 01/27/2023   Visual impairment    Weakness 09/23/2022    Assessment: Patient Reported Symptoms:  Cognitive Cognitive Status: No symptoms reported (completed call with sister [name]) Cognitive/Intellectual Conditions Management [RPT]: None reported or documented in medical history or problem list   Health Maintenance Behaviors: Annual physical exam Health Facilitated by: Rest  Neurological Neurological Review of Symptoms: No symptoms reported    HEENT HEENT Symptoms Reported: No symptoms reported      Cardiovascular Cardiovascular Symptoms Reported: No symptoms reported Does patient have uncontrolled Hypertension?: No Cardiovascular Management Strategies: Medication therapy, Routine screening Cardiovascular Self-Management Outcome: 4 (good)  Respiratory Respiratory Symptoms Reported: No symptoms reported    Endocrine Endocrine Symptoms Reported: No symptoms reported Is patient diabetic?: No    Gastrointestinal Gastrointestinal Symptoms Reported: Change in appetite, Incontinence, Distention Gastrointestinal Management Strategies: Incontinence garment/pad Gastrointestinal Self-Management Outcome: 3 (uncertain)    Genitourinary Genitourinary Symptoms Reported: Incontinence, Other Other Genitourinary Symptoms: urinary retention Genitourinary Management Strategies: Catheter, indwelling Genitourinary Self-Management Outcome: 4 (good) Genitourinary Comment: [facility] continues to provide monthy foley changes  Integumentary Integumentary Symptoms Reported: Skin changes Additional Integumentary Details: skin breakdown noted to patient's backside, no open areas currently, noted discoloration to ankles; educated sister [name] re: skin protection, s/s of breakdown and when to call the doctor Skin Management Strategies: Routine screening Skin Self-Management Outcome: 4 (good)  Musculoskeletal Musculoskelatal Symptoms Reviewed:  Difficulty walking, Limited mobility, Weakness Additional Musculoskeletal Details: patient is bedbound Musculoskeletal Management Strategies: Adequate rest, Routine screening Musculoskeletal Self-Management Outcome: 2 (bad) Musculoskeletal Comment: educated sister [name] re: potential complications related to being bedbound and when to seek medical attention if symptoms occur      Psychosocial Psychosocial Symptoms Reported: Alteration in sleep habits, Alteration in eating habits Behavioral Management Strategies: Adequate rest, Support system Behavioral Health Self-Management Outcome: 2 (bad) Major Change/Loss/Stressor/Fears (CP): Denies Quality of Family Relationships: helpful, involved, supportive Do you feel physically threatened by others?: No    08/30/2024    PHQ2-9 Depression Screening   Euline Kimbler interest or pleasure in doing things    Feeling down, depressed, or hopeless    PHQ-2 - Total Score    Trouble falling or staying asleep, or sleeping too much    Feeling tired or having Soma Lizak energy    Poor appetite or overeating     Feeling bad about yourself - or that you are a failure or have let yourself or your family down    Trouble concentrating on things, such as reading the newspaper or watching television    Moving or speaking so slowly that other people could have noticed.  Or the opposite - being so fidgety or restless that you have been moving around a lot more than usual    Thoughts that you would be better off dead, or hurting yourself in some way    PHQ2-9 Total Score    If you checked off any problems, how difficult have these problems made it for you to do your work, take care of things at home, or get along with other people    Depression Interventions/Treatment      There were no vitals filed for this visit. Pain Scale: Not given for pain  Medications Reviewed Today     Reviewed by [name], RN (Registered Nurse) on 08/30/24 at 1120  Med List Status: <None>    Medication Order Taking? Sig Documenting Provider Last Dose Status Informant  acetaminophen  (TYLENOL ) 500 MG tablet [id number]  Take 1,000 mg by mouth 2 (two) times daily as needed for mild pain or moderate pain (leg pain). [provider]  Active Family Member  albuterol  (VENTOLIN  HFA) 108 (90 Base) MCG/ACT inhaler [id number]  INHALE 1 TO 2 PUFFS INTO THE LUNGS EVERY 6 HOURS AS NEEDED FOR WHEEZING OR SHORTNESS OF [name] [name], NP  Active   carvedilol  (COREG ) 3.125 MG tablet [id number]  Take 1 tablet (3.125 mg total) by mouth 2 (two) times daily with a meal. [name], FNP  Expired 06/29/24 2359   cephALEXin  (KEFLEX ) 500 MG capsule [id number]  Take 1 capsule (500 mg total) by mouth 4 (four) times daily. Mannie Pac T, DO  Active   [name]  10 MG TABS tablet [id number]  TAKE 1 TABLET(10 MG) BY MOUTH DAILY BEFORE [name] [name], FNP  Active   ferrous sulfate  (FEROSUL) 325 (65 FE) MG tablet [id number]  Take 1 tablet (325 mg total) by mouth daily. [name], FNP  Active   finasteride  (PROSCAR ) 5 MG tablet [id number]  Take 5 mg by mouth daily. [provider]  Active Family Member  fluticasone  (FLONASE ) 50 MCG/ACT nasal spray [id number]  Place 2 sprays into both nostrils daily. Kehrli, Kelsey F, PA-C  Active   furosemide  (LASIX ) 20 MG tablet [id number]  Take 1 tablet (20 mg total) by mouth as needed. Thukkani, Arun K, MD  Active   LUMIGAN 0.01 % LARRAINE [id number]  Place 1 drop into both eyes at bedtime. [provider]  Active Family Member           Med Note [place], DONETA GORMAN [place] Jan 27, 2023  9:42 PM) Extra drop placed today, due to blurry vision  meclizine  (ANTIVERT ) 25 MG tablet [id number]  Take 1 tablet (25 mg total) by mouth 3 (three) times daily as needed for dizziness. Alva Larraine FALCON, PA-C  Active   SIMBRINZA  1-0.2 % SUSP [id number]  Follow up with your PCP on when to restart this medication.  Patient taking differently: Place 1 drop into both eyes 3  (three) times daily.   Briana Elgin LABOR, MD  Active Family Member  Vitamin D , Ergocalciferol , (DRISDOL) 1.25 MG (50000 UNIT) CAPS capsule [id number]  Take 50,000 Units by mouth once a week. [provider]  Active   vitamin D3 (CHOLECALCIFEROL) 25 MCG tablet [id number]  Take 1,000 Units by mouth daily. [provider]  Active Family Member  Med List Note Isabel Doneta GORMAN, CPhT 01/27/23 2146): Sister, [place], assists patient with medication            Recommendation:   Continue Current Plan of Care  Follow Up Plan:   Telephone follow up appointment date/time    09/26/2024 Status: Sch   Time: 11:00 AM Length: 30  Visit Type: VBCI TELEPHONE CALL 30 [2502] Copay: $0.00  Provider: [name], RN Department: [facility]   Clayborne Morgan RN BSN CCM [place]  [facility] Nurse Care Coordinator  Direct Dial: [phone] Website: [email or web address]

## 2024-08-30 NOTE — Patient Instructions (Signed)
 Visit Information  Thank you for taking time to visit with me today. Please don't hesitate to contact me if I can be of assistance to you before our next scheduled appointment.  Your next care management appointment is by telephone on Tuesday, February 17 at 11:00 AM  Please call the care guide team at [phone] if you need to cancel, schedule, or reschedule an appointment.   Please call [phone]-TALK (toll free, 24 hour hotline) if you are experiencing a Mental Health or Behavioral Health Crisis or need someone to talk to.  Clayborne Ly RN BSN CCM [place]  [facility], [facility] Nurse Care Coordinator  Direct Dial: [phone] Website: [email or web address]

## 2024-09-06 ENCOUNTER — Telehealth: Payer: Self-pay

## 2024-09-06 NOTE — Telephone Encounter (Signed)
 Copied from CRM #[id number]. Topic: Clinical - Order For Equipment >> Sep 06, 2024 11:21 AM Selinda RAMAN wrote: Reason for CRM: [name] the sister of the patient called in stating even though he has not seen his provider since November he no longer can walk anymore. He is also trying to get out of bed a lot and she is worried about him. She would like for his provider to order a hospital bed if possible so he has the railings to keep him safe. If he needs an appointment all he can do since he cannot walk or has my chart would be a telephone appointment like he does with Clayborne Little with [employer]. Please assist patient further and contact [name] when the provider is able to respond. She is not sure what DME company to go with but whatever company the provider recommends that accepts his insurance will be good.  LVM for [name]- patient will need appt at least a virtual visit.

## 2024-09-14 ENCOUNTER — Ambulatory Visit

## 2024-09-14 ENCOUNTER — Other Ambulatory Visit: Payer: Self-pay | Admitting: Nurse Practitioner

## 2024-09-14 LAB — CUP PACEART REMOTE DEVICE CHECK
Date Time Interrogation Session: 20260204230137
Implantable Pulse Generator Implant Date: 20201014

## 2024-09-26 ENCOUNTER — Telehealth

## 2024-10-15 ENCOUNTER — Ambulatory Visit

## 2024-12-26 ENCOUNTER — Ambulatory Visit: Admitting: Nurse Practitioner

## 2025-01-24 ENCOUNTER — Ambulatory Visit: Payer: Self-pay

## 2025-07-03 ENCOUNTER — Encounter: Payer: Self-pay | Admitting: Nurse Practitioner
